# Patient Record
Sex: Female | Born: 1950 | Race: White | Hispanic: No | Marital: Married | Smoking: Never smoker
Health system: Southern US, Community
[De-identification: ages and names within clinical notes are randomized; demographics above are authoritative.]

## PROBLEM LIST (undated history)

## (undated) DIAGNOSIS — G44009 Cluster headache syndrome, unspecified, not intractable: Secondary | ICD-10-CM

## (undated) DIAGNOSIS — J189 Pneumonia, unspecified organism: Secondary | ICD-10-CM

## (undated) DIAGNOSIS — Z9989 Dependence on other enabling machines and devices: Secondary | ICD-10-CM

## (undated) DIAGNOSIS — C50412 Malignant neoplasm of upper-outer quadrant of left female breast: Principal | ICD-10-CM

## (undated) DIAGNOSIS — C50919 Malignant neoplasm of unspecified site of unspecified female breast: Secondary | ICD-10-CM

## (undated) DIAGNOSIS — Z9889 Other specified postprocedural states: Secondary | ICD-10-CM

## (undated) DIAGNOSIS — R011 Cardiac murmur, unspecified: Secondary | ICD-10-CM

## (undated) DIAGNOSIS — Z923 Personal history of irradiation: Secondary | ICD-10-CM

## (undated) DIAGNOSIS — I10 Essential (primary) hypertension: Secondary | ICD-10-CM

## (undated) DIAGNOSIS — K219 Gastro-esophageal reflux disease without esophagitis: Secondary | ICD-10-CM

## (undated) DIAGNOSIS — T7840XA Allergy, unspecified, initial encounter: Secondary | ICD-10-CM

## (undated) DIAGNOSIS — I639 Cerebral infarction, unspecified: Secondary | ICD-10-CM

## (undated) DIAGNOSIS — E7800 Pure hypercholesterolemia, unspecified: Secondary | ICD-10-CM

## (undated) DIAGNOSIS — G4730 Sleep apnea, unspecified: Secondary | ICD-10-CM

## (undated) DIAGNOSIS — L719 Rosacea, unspecified: Secondary | ICD-10-CM

## (undated) DIAGNOSIS — IMO0002 Reserved for concepts with insufficient information to code with codable children: Secondary | ICD-10-CM

## (undated) DIAGNOSIS — M1990 Unspecified osteoarthritis, unspecified site: Secondary | ICD-10-CM

## (undated) DIAGNOSIS — G4733 Obstructive sleep apnea (adult) (pediatric): Secondary | ICD-10-CM

## (undated) DIAGNOSIS — R600 Localized edema: Secondary | ICD-10-CM

## (undated) DIAGNOSIS — R1310 Dysphagia, unspecified: Secondary | ICD-10-CM

## (undated) DIAGNOSIS — R112 Nausea with vomiting, unspecified: Secondary | ICD-10-CM

## (undated) DIAGNOSIS — K52839 Microscopic colitis, unspecified: Secondary | ICD-10-CM

## (undated) DIAGNOSIS — I872 Venous insufficiency (chronic) (peripheral): Secondary | ICD-10-CM

## (undated) DIAGNOSIS — J45909 Unspecified asthma, uncomplicated: Secondary | ICD-10-CM

## (undated) HISTORY — PX: TONSILLECTOMY: SUR1361

## (undated) HISTORY — PX: FRACTURE SURGERY: SHX138

## (undated) HISTORY — PX: TUBAL LIGATION: SHX77

## (undated) HISTORY — PX: CARPAL TUNNEL RELEASE: SHX101

## (undated) HISTORY — DX: Essential (primary) hypertension: I10

## (undated) HISTORY — DX: Unspecified osteoarthritis, unspecified site: M19.90

## (undated) HISTORY — PX: SQUAMOUS CELL CARCINOMA EXCISION: SHX2433

## (undated) HISTORY — DX: Localized edema: R60.0

## (undated) HISTORY — DX: Malignant neoplasm of upper-outer quadrant of left female breast: C50.412

## (undated) HISTORY — PX: FOREARM FRACTURE SURGERY: SHX649

## (undated) HISTORY — PX: APPENDECTOMY: SHX54

## (undated) HISTORY — PX: KNEE ARTHROSCOPY: SHX127

## (undated) HISTORY — PX: COLONOSCOPY: SHX174

## (undated) HISTORY — DX: Cerebral infarction, unspecified: I63.9

## (undated) HISTORY — DX: Allergy, unspecified, initial encounter: T78.40XA

## (undated) HISTORY — PX: BREAST BIOPSY: SHX20

## (undated) HISTORY — DX: Dysphagia, unspecified: R13.10

---

## 1979-04-09 DIAGNOSIS — IMO0002 Reserved for concepts with insufficient information to code with codable children: Secondary | ICD-10-CM

## 1979-04-09 HISTORY — DX: Reserved for concepts with insufficient information to code with codable children: IMO0002

## 1997-08-08 HISTORY — PX: ABDOMINAL HYSTERECTOMY: SHX81

## 2003-06-23 DIAGNOSIS — I452 Bifascicular block: Secondary | ICD-10-CM | POA: Insufficient documentation

## 2009-12-24 DIAGNOSIS — R2230 Localized swelling, mass and lump, unspecified upper limb: Secondary | ICD-10-CM | POA: Insufficient documentation

## 2013-07-19 DIAGNOSIS — G4733 Obstructive sleep apnea (adult) (pediatric): Secondary | ICD-10-CM | POA: Insufficient documentation

## 2013-10-23 DIAGNOSIS — E669 Obesity, unspecified: Secondary | ICD-10-CM | POA: Insufficient documentation

## 2014-08-08 HISTORY — PX: REDUCTION MAMMAPLASTY: SUR839

## 2014-08-08 HISTORY — PX: BREAST LUMPECTOMY: SHX2

## 2014-09-04 ENCOUNTER — Other Ambulatory Visit: Payer: Self-pay

## 2014-09-04 DIAGNOSIS — N63 Unspecified lump in unspecified breast: Secondary | ICD-10-CM

## 2014-09-05 ENCOUNTER — Other Ambulatory Visit: Payer: Self-pay

## 2014-09-05 ENCOUNTER — Other Ambulatory Visit: Payer: Self-pay | Admitting: Obstetrics and Gynecology

## 2014-09-05 DIAGNOSIS — N6489 Other specified disorders of breast: Secondary | ICD-10-CM

## 2014-09-15 ENCOUNTER — Other Ambulatory Visit: Payer: Self-pay | Admitting: Obstetrics and Gynecology

## 2014-09-15 DIAGNOSIS — N6489 Other specified disorders of breast: Secondary | ICD-10-CM

## 2014-09-18 ENCOUNTER — Encounter (INDEPENDENT_AMBULATORY_CARE_PROVIDER_SITE_OTHER): Payer: Self-pay

## 2014-09-18 ENCOUNTER — Ambulatory Visit
Admission: RE | Admit: 2014-09-18 | Discharge: 2014-09-18 | Disposition: A | Discharge status: Home / Self Care | Payer: Private Health Insurance - Indemnity | Source: Ambulatory Visit

## 2014-09-18 ENCOUNTER — Ambulatory Visit
Admission: RE | Admit: 2014-09-18 | Discharge: 2014-09-18 | Disposition: A | Discharge status: Home / Self Care | Payer: Private Health Insurance - Indemnity | Source: Ambulatory Visit | Attending: Obstetrics and Gynecology | Admitting: Obstetrics and Gynecology

## 2014-09-18 ENCOUNTER — Other Ambulatory Visit: Payer: Self-pay | Admitting: Obstetrics and Gynecology

## 2014-09-18 DIAGNOSIS — N6489 Other specified disorders of breast: Secondary | ICD-10-CM

## 2014-09-18 DIAGNOSIS — C50919 Malignant neoplasm of unspecified site of unspecified female breast: Secondary | ICD-10-CM

## 2014-09-18 HISTORY — DX: Malignant neoplasm of unspecified site of unspecified female breast: C50.919

## 2014-09-19 ENCOUNTER — Ambulatory Visit
Admission: RE | Admit: 2014-09-19 | Discharge: 2014-09-19 | Disposition: A | Discharge status: Home / Self Care | Payer: Private Health Insurance - Indemnity | Source: Ambulatory Visit | Attending: Obstetrics and Gynecology | Admitting: Obstetrics and Gynecology

## 2014-09-19 ENCOUNTER — Other Ambulatory Visit: Payer: Self-pay | Admitting: Obstetrics and Gynecology

## 2014-09-19 DIAGNOSIS — N6489 Other specified disorders of breast: Secondary | ICD-10-CM

## 2014-09-19 DIAGNOSIS — C50912 Malignant neoplasm of unspecified site of left female breast: Secondary | ICD-10-CM

## 2014-09-19 DIAGNOSIS — D0512 Intraductal carcinoma in situ of left breast: Secondary | ICD-10-CM

## 2014-09-22 ENCOUNTER — Telehealth: Payer: Self-pay | Admitting: *Deleted

## 2014-09-22 ENCOUNTER — Encounter: Payer: Self-pay | Admitting: *Deleted

## 2014-09-22 DIAGNOSIS — C50912 Malignant neoplasm of unspecified site of left female breast: Secondary | ICD-10-CM

## 2014-09-22 DIAGNOSIS — C50412 Malignant neoplasm of upper-outer quadrant of left female breast: Secondary | ICD-10-CM

## 2014-09-22 DIAGNOSIS — C50911 Malignant neoplasm of unspecified site of right female breast: Secondary | ICD-10-CM | POA: Insufficient documentation

## 2014-09-22 HISTORY — DX: Malignant neoplasm of upper-outer quadrant of left female breast: C50.412

## 2014-09-22 NOTE — Telephone Encounter (Signed)
Confirmed [facility] for 10/01/14 at 1200 .  Instructions and contact information given. Pt recently moved from [place].

## 2014-09-22 NOTE — Telephone Encounter (Signed)
Left vm for pt to return call to scheduled [facility] 10/01/14.

## 2014-09-23 ENCOUNTER — Other Ambulatory Visit: Payer: Self-pay | Admitting: General Surgery

## 2014-09-23 DIAGNOSIS — C50912 Malignant neoplasm of unspecified site of left female breast: Secondary | ICD-10-CM

## 2014-09-23 DIAGNOSIS — D0512 Intraductal carcinoma in situ of left breast: Secondary | ICD-10-CM

## 2014-09-26 ENCOUNTER — Inpatient Hospital Stay: Admission: RE | Admit: 2014-09-26 | Payer: Private Health Insurance - Indemnity | Source: Ambulatory Visit

## 2014-09-26 ENCOUNTER — Ambulatory Visit
Admission: RE | Admit: 2014-09-26 | Discharge: 2014-09-26 | Disposition: A | Discharge status: Home / Self Care | Payer: Private Health Insurance - Indemnity | Source: Ambulatory Visit | Attending: General Surgery | Admitting: General Surgery

## 2014-09-26 DIAGNOSIS — C50912 Malignant neoplasm of unspecified site of left female breast: Secondary | ICD-10-CM

## 2014-09-26 DIAGNOSIS — D0512 Intraductal carcinoma in situ of left breast: Secondary | ICD-10-CM

## 2014-09-26 MED ORDER — GADOBENATE DIMEGLUMINE 529 MG/ML IV SOLN
18.0000 mL | Freq: Once | INTRAVENOUS | Status: AC | PRN
  Administered 2014-09-26: 18 mL via INTRAVENOUS

## 2014-09-30 ENCOUNTER — Other Ambulatory Visit: Payer: Self-pay | Admitting: General Surgery

## 2014-09-30 DIAGNOSIS — R928 Other abnormal and inconclusive findings on diagnostic imaging of breast: Secondary | ICD-10-CM

## 2014-10-01 ENCOUNTER — Ambulatory Visit
Admission: RE | Admit: 2014-10-01 | Discharge: 2014-10-01 | Disposition: A | Discharge status: Home / Self Care | Payer: Private Health Insurance - Indemnity | Source: Ambulatory Visit | Attending: Radiation Oncology | Admitting: Radiation Oncology

## 2014-10-01 ENCOUNTER — Ambulatory Visit: Payer: Managed Care, Other (non HMO) | Attending: General Surgery | Admitting: Physical Therapy

## 2014-10-01 ENCOUNTER — Encounter: Payer: Self-pay | Admitting: Physical Therapy

## 2014-10-01 ENCOUNTER — Other Ambulatory Visit (HOSPITAL_BASED_OUTPATIENT_CLINIC_OR_DEPARTMENT_OTHER): Payer: Private Health Insurance - Indemnity

## 2014-10-01 ENCOUNTER — Encounter: Payer: Self-pay | Admitting: Oncology

## 2014-10-01 ENCOUNTER — Ambulatory Visit: Payer: Private Health Insurance - Indemnity

## 2014-10-01 ENCOUNTER — Telehealth: Payer: Self-pay | Admitting: Oncology

## 2014-10-01 ENCOUNTER — Ambulatory Visit (HOSPITAL_BASED_OUTPATIENT_CLINIC_OR_DEPARTMENT_OTHER): Payer: Private Health Insurance - Indemnity | Admitting: Oncology

## 2014-10-01 ENCOUNTER — Other Ambulatory Visit: Payer: Private Health Insurance - Indemnity

## 2014-10-01 VITALS — BP 137/72 | HR 57 | Temp 98.6°F | Resp 18 | Wt 198.1 lb

## 2014-10-01 DIAGNOSIS — C50412 Malignant neoplasm of upper-outer quadrant of left female breast: Secondary | ICD-10-CM

## 2014-10-01 DIAGNOSIS — E7800 Pure hypercholesterolemia, unspecified: Secondary | ICD-10-CM | POA: Insufficient documentation

## 2014-10-01 DIAGNOSIS — R293 Abnormal posture: Secondary | ICD-10-CM | POA: Diagnosis not present

## 2014-10-01 DIAGNOSIS — K219 Gastro-esophageal reflux disease without esophagitis: Secondary | ICD-10-CM | POA: Insufficient documentation

## 2014-10-01 DIAGNOSIS — C50212 Malignant neoplasm of upper-inner quadrant of left female breast: Secondary | ICD-10-CM | POA: Insufficient documentation

## 2014-10-01 DIAGNOSIS — Z170 Estrogen receptor positive status [ER+]: Secondary | ICD-10-CM

## 2014-10-01 DIAGNOSIS — I10 Essential (primary) hypertension: Secondary | ICD-10-CM | POA: Insufficient documentation

## 2014-10-01 LAB — CBC WITH DIFFERENTIAL/PLATELET
BASO%: 0.4 % (ref 0.0–2.0)
BASOS ABS: 0 10*3/uL (ref 0.0–0.1)
EOS%: 2.5 % (ref 0.0–7.0)
Eosinophils Absolute: 0.1 10*3/uL (ref 0.0–0.5)
HCT: 41.2 % (ref 34.8–46.6)
HGB: 13.6 g/dL (ref 11.6–15.9)
LYMPH#: 1.5 10*3/uL (ref 0.9–3.3)
LYMPH%: 27 % (ref 14.0–49.7)
MCH: 31.9 pg (ref 25.1–34.0)
MCHC: 33.1 g/dL (ref 31.5–36.0)
MCV: 96.3 fL (ref 79.5–101.0)
MONO#: 0.6 10*3/uL (ref 0.1–0.9)
MONO%: 9.7 % (ref 0.0–14.0)
NEUT%: 60.4 % (ref 38.4–76.8)
NEUTROS ABS: 3.4 10*3/uL (ref 1.5–6.5)
Platelets: 196 10*3/uL (ref 145–400)
RBC: 4.28 10*6/uL (ref 3.70–5.45)
RDW: 12.6 % (ref 11.2–14.5)
WBC: 5.7 10*3/uL (ref 3.9–10.3)

## 2014-10-01 LAB — COMPREHENSIVE METABOLIC PANEL (CC13)
ALBUMIN: 4.1 g/dL (ref 3.5–5.0)
ALT: 28 U/L (ref 0–55)
AST: 18 U/L (ref 5–34)
Alkaline Phosphatase: 54 U/L (ref 40–150)
Anion Gap: 10 mEq/L (ref 3–11)
BUN: 16.3 mg/dL (ref 7.0–26.0)
CHLORIDE: 105 meq/L (ref 98–109)
CO2: 28 mEq/L (ref 22–29)
Calcium: 10.5 mg/dL — ABNORMAL HIGH (ref 8.4–10.4)
Creatinine: 0.9 mg/dL (ref 0.6–1.1)
EGFR: 73 mL/min/{1.73_m2} — ABNORMAL LOW (ref >90)
GLUCOSE: 102 mg/dL (ref 70–140)
POTASSIUM: 4.2 meq/L (ref 3.5–5.1)
Sodium: 142 mEq/L (ref 136–145)
TOTAL PROTEIN: 7.2 g/dL (ref 6.4–8.3)
Total Bilirubin: 0.41 mg/dL (ref 0.20–1.20)

## 2014-10-01 NOTE — Telephone Encounter (Signed)
per pf ot sch pt appt-gave pt copy of sch

## 2014-10-01 NOTE — Patient Instructions (Signed)

## 2014-10-01 NOTE — Progress Notes (Signed)
[place]  Telephone:([phone] Fax:(336) [phone]     ID: Laura Bush DOB: October 14, 1950  MR#: [id number]  UKG#:[id number]  Patient Care Team: No Pcp Per Patient as PCP - General (General Practice) Fanny Skates, MD as Consulting Physician (General Surgery) Chauncey Cruel, MD as Consulting Physician (Oncology) Jodelle Gross, MD as Consulting Physician (Radiation Oncology) [facility], RN as Registered Nurse Roselee Culver, RN as Registered Nurse PCP: No PCP Per Patient GYN: SU:  OTHER MD:  CHIEF COMPLAINT: estrogen receptor positive breast cancer  CURRENT TREATMENT: awaiting definitive surgery   BREAST CANCER HISTORY: "Laura Bush"  recently moved to the [place] area from [place]. While still there, she had routine screening mammography with tomography 02/14/2014, which showed only a tiny breast cysts. Six-month follow-up exam was performed 08/25/2014, and now there was evidence of a possible focal asymmetry in the left breast. On 08/25/2014 at the [place] of radiology she underwent a unilateral diagnostic mammography with tomography , and this confirmed a spiculated mass in the left breast superiorly. Sonography of this area found a poorly defined hypoechoic mass measuring 0.7 cm at the 11:00 position 8 cm from the nipple. There was also an adjacent cluster of cysts with a benign circumcised appearance.   Biopsy of the mass in question was recommended, but as the patient was moving to this area, this was performed here on 09/18/2014. 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    on 09/26/2014 the patient underwent bilateral breast MRI at [facility] imaging, showing a breast density category B. The right  breast was unremarkable and there were no lymph nodes of concern. In the upper inner quadrant of the left breast there was an enhancing mass measuring 1.2 cm. In addition there was a 5.5 cm area of linear enhancement extending anteriorly from this mass. 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   The patient's subsequent history is as detailed below  INTERVAL HISTORY:  Laura Bush was evaluated in the multidisciplinary breast cancer clinic 10/01/2014 accompanied by her husband Laura Bush. Her case was presented at the multidisciplinary breast cancer conference that same morning. The consensus was that breast conservation  Surgery might be possible with the help of plastic surgery. However additional biopsy from the area of non-masslike enhancement in question was suggested and is scheduled for 10/09/2014. Assuming the patients final pathology shows no lymph node involvement, as anticipated, she would be a good candidate for an Oncotype.  REVIEW OF SYSTEMS: There were no specific symptoms leading to the original mammogram, which was routinely scheduled. The patient denies unusual headaches, visual changes, nausea, vomiting, stiff neck, dizziness, or gait imbalance. There has been no cough, phlegm production, or pleurisy, no chest pain or pressure, and no change in bowel or bladder habits. The patient denies fever, rash, bleeding, unexplained fatigue or unexplained weight loss.  Laura Bush does not exercise regularly but she and Laura Bush have recently joined a gym [place] detailed review of systems was otherwise entirely negative.  PAST MEDICAL HISTORY: Past Medical History  Diagnosis Date  . Breast cancer of upper-outer quadrant of left female breast 09/22/2014  . Hypertension   . Skin cancer     PAST SURGICAL HISTORY: Past Surgical History  Procedure Laterality Date  . Appendectomy    .  Tonsillectomy    . Cesarean section      x2  . Tubal ligation    . Abdominal hysterectomy    . Meniscus  repair      FAMILY HISTORY Family History  Problem Relation Age of Onset  . Uterine cancer Sister    the patient's father died at the age of 65 following a stroke. The patient's mother is still living, age 42. GEN had no brothers, one sister. That sister was diagnosed with uterine cancer at the age of 47. There is no other history of breast or ovarian cancer in the family to the patient's knowledge  GYNECOLOGIC HISTORY:  No LMP recorded.  menarche age 59, first live birth age 46, the patient is Laura Bush P2.  She underwent total abdominal hysterectomy with bilateral salpingo-oophorectomy in 1999. She took hormone replacement for approximately 4 years. 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 Their daughter Laura Bush lives in [place] where she works as an Web designer. Their son Laura Bush lives in [place] where he works as a Education officer, museum. Laura Bush is expecting twins.    ADVANCED DIRECTIVES:  Not in place. The patient has the appropriate forms to complete and notarize at her discretion.   HEALTH MAINTENANCE: History  Substance Use Topics  . Smoking status: Never Smoker   . Smokeless tobacco: Not on file  . Alcohol Use: 2.4 oz/week    4 Glasses of wine per week     Comment: 4 glasses a week     Colonoscopy:  2008  PAP:  Bone density: 2007/"normal"  Lipid panel:  No Known Allergies  Current Outpatient Prescriptions  Medication Sig Dispense Refill  . aMILoride (MIDAMOR) 5 MG tablet Take 5 mg by mouth daily.    . B Complex-C (SUPER B COMPLEX PO) Take by mouth daily.    . Cholecalciferol (VITAMIN D3) 2000 UNITS TABS Take 1 tablet by mouth daily.    . hydrochlorothiazide (MICROZIDE) 12.5 MG capsule Take 12.5 mg by mouth daily.    . metoprolol succinate (TOPROL-XL) 50 MG 24 hr tablet Take 50 mg by mouth  daily. Take with or immediately following a meal.    . omega-3 fish oil (MAXEPA) 1000 MG CAPS capsule Take 1 capsule by mouth daily. Salmon oil    . simvastatin (ZOCOR) 20 MG tablet Take 20 mg by mouth daily.    . Telmisartan-Amlodipine 80-5 MG TABS Take 1 tablet by mouth daily.  3   No current facility-administered medications for this visit.    OBJECTIVE:  Middle-aged white woman who appears stated age 64 Vitals:   10/01/14 1244  BP: 137/72  Pulse: 57  Temp: 98.6 F (37 C)  Resp: 18     There is no height on file to calculate BMI.    ECOG FS:0 - Asymptomatic  Ocular: Sclerae unicteric, pupils equal, round and reactive to light Ear-nose-throat: Oropharynx clear, dentition fair Lymphatic: No cervical or supraclavicular adenopathy Lungs no rales or rhonchi, good excursion bilaterally Heart regular rate and rhythm, no murmur appreciated Abd soft, obese, nontender, positive bowel sounds MSK no focal spinal tenderness, no joint edema Neuro: non-focal, well-oriented, appropriate affect Breasts:  The right breast is unremarkable. The left breast is status post recent biopsy. I do not palpate a well-defined mass. There are no skin or nipple changes of concern. The  left axilla is benign.   LAB RESULTS:  CMP     Component Value Date/Time   NA 142 10/01/2014 1230   K 4.2 10/01/2014 1230   CO2 28 10/01/2014 1230   GLUCOSE 102 10/01/2014 1230   BUN 16.3 10/01/2014 1230   CREATININE 0.9 10/01/2014 1230   CALCIUM 10.5* 10/01/2014 1230   PROT 7.2 10/01/2014 1230   ALBUMIN 4.1 10/01/2014 1230   AST 18 10/01/2014 1230   ALT 28 10/01/2014 1230   ALKPHOS 54 10/01/2014 1230   BILITOT 0.41 10/01/2014 1230    INo results found for: SPEP, UPEP  Lab Results  Component Value Date   WBC 5.7 10/01/2014   NEUTROABS 3.4 10/01/2014   HGB 13.6 10/01/2014   HCT 41.2 10/01/2014   MCV 96.3 10/01/2014   PLT 196 10/01/2014      Chemistry      Component Value Date/Time   NA 142  10/01/2014 1230   K 4.2 10/01/2014 1230   CO2 28 10/01/2014 1230   BUN 16.3 10/01/2014 1230   CREATININE 0.9 10/01/2014 1230      Component Value Date/Time   CALCIUM 10.5* 10/01/2014 1230   ALKPHOS 54 10/01/2014 1230   AST 18 10/01/2014 1230   ALT 28 10/01/2014 1230   BILITOT 0.41 10/01/2014 1230       No results found for: LABCA2  No components found for: LABCA125  No results for input(s): INR in the last 168 hours.  Urinalysis No results found for: COLORURINE, APPEARANCEUR, LABSPEC, PHURINE, GLUCOSEU, HGBUR, BILIRUBINUR, KETONESUR, PROTEINUR, UROBILINOGEN, NITRITE, LEUKOCYTESUR  STUDIES: Mr Breast Bilateral W Wo Contrast  09/29/2014   CLINICAL DATA:  64 year old female with biopsy proven invasive ductal carcinoma and ductal carcinoma in situ in the 11 o'clock region of the left breast.  LABS:  None obtained at the time of imaging.  EXAM: BILATERAL BREAST MRI WITH AND WITHOUT CONTRAST  TECHNIQUE: Multiplanar, multisequence MR images of both breasts were obtained prior to and following the intravenous administration of 18 ml of Multihance.  THREE-DIMENSIONAL MR IMAGE RENDERING ON INDEPENDENT WORKSTATION:  Three-dimensional MR images were rendered by post-processing of the original MR data on an independent workstation. The three-dimensional MR images were interpreted, and findings are reported in the following complete MRI report for this study. Three dimensional images were evaluated at the independent DynaCad workstation  COMPARISON:  Previous exam(s).  FINDINGS: Breast composition: b.  Scattered fibroglandular tissue.  Background parenchymal enhancement: Moderate.  Right breast: No mass or abnormal enhancement.  Left breast: In the posterior third of the upper-inner quadrant of the left breast is an enhancing mass measuring 1.2 x 0.8 x 1.0 cm. It is associated with a signal void artifact from a biopsy clip. There are post biopsy changes. There is 5.5 cm of linear enhancement extending  anteriorly from the mass. Ductal carcinoma in-situ could not be excluded  Lymph nodes: No abnormal appearing lymph nodes.  Ancillary findings:  None.  IMPRESSION: 1.2 cm mass in the 11 o'clock region of the left breast corresponding with the biopsy proven invasive ductal carcinoma and ductal carcinoma in situ. 5.5 cm of linear enhancement extending anteriorly from the mass concerning for ductal carcinoma in-situ.  RECOMMENDATION: MR guided core biopsy of the anterior aspect of the linear enhancement extending from the known malignancy is recommended. This will be scheduled at the patient's convenience.  BI-RADS CATEGORY  4: Suspicious.   Electronically Signed   By: Lillia Mountain M.D.   On: 09/29/2014 13:34  Mm Digital Diagnostic Unilat L  09/18/2014   CLINICAL DATA:  Suspicious left breast mass.  EXAM: DIAGNOSTIC LEFT MAMMOGRAM POST ULTRASOUND BIOPSY  COMPARISON:  Previous exam(s).  FINDINGS: Mammographic images were obtained following ultrasound guided biopsy of left breast mass, 11 o'clock position. Heart shaped biopsy marking clip in appropriate position  IMPRESSION: Appropriate position heart shaped biopsy marking clip status post ultrasound-guided core needle biopsy left breast mass, 11 o'clock position.  Final Assessment: Post Procedure Mammograms for Marker Placement   Electronically Signed   By: Lovey Newcomer M.D.   On: 09/18/2014 12:27   Mm Radiologist Eval And Mgmt  09/19/2014   CONSULTATION: HPI: The patient returns for a followup after ultrasound-guided biopsy of a 0.7 cm mass in the upper left breast. The patient is doing well and denies any biopsy site complications.  Pathology results: Grade 2 invasive ductal carcinoma and ductal carcinoma in situ. This is concordant with the imaging findings.  Biopsy site: Clean, dry, and intact. Steri-Strips overlie small skin incision.  PLAN: All the patient's questions were answered. She is aware of the results and demonstrates understanding. She has received  breast cancer information material.  The patient is scheduled to follow-up in Multidisciplinary conference 10/01/2014 and a breast MRI is scheduled on 09/26/2014 at 10:15 a.m.   Electronically Signed   By: Everlean Alstrom M.D.   On: 09/19/2014 13:36   [place] Lt Breast Bx W Loc Dev 1st Lesion Img Bx Spec US Guide  09/18/2014   CLINICAL DATA:  Patient with suspicious left breast mass, 11 o'clock position appear  EXAM: ULTRASOUND GUIDED LEFT BREAST CORE NEEDLE BIOPSY  COMPARISON:  Previous exam(s).  PROCEDURE: I met with the patient and we discussed the procedure of ultrasound-guided biopsy, including benefits and alternatives. We discussed the high likelihood of a successful procedure. We discussed the risks of the procedure including infection, bleeding, tissue injury, clip migration, and inadequate sampling. Informed written consent was given. The usual time-out protocol was performed immediately prior to the procedure.  Using sterile technique and 2% Lidocaine as local anesthetic, under direct ultrasound visualization, a 12 gauge vacuum-assisted device was used to perform biopsy of left breast mass, 11 o'clock positionusing a medial approach. At the conclusion of the procedure, a heart shaped tissue marker clip was deployed into the biopsy cavity. Follow-up 2-view mammogram was performed and dictated separately.  IMPRESSION: Ultrasound-guided biopsy of left breast mass, 11 o'clock position. No apparent complications.   Electronically Signed   By: Lovey Newcomer M.D.   On: 09/18/2014 12:26    ASSESSMENT: 64 y.o. [place], [place] woman status post left breast upper outer quadrant biopsy 09/18/2014 for a clinical T1 cN0 invasive ductal carcinoma, grade 2, estrogen and progesterone receptor positive, HER-2 not amplified, with an MIB-1 of 30%  (1) there is a 5.5 cm area of non-masslike enhancement seen in the left breast with biopsy scheduled for 10/07/2014  (2) definitive surgery is pending, possibly requiring plastics  reconstruction  (3) an Oncotype DX will be requested from the final pathology sample  (4)  A final decision regarding chemotherapy will be made depending on Oncotype and final pathology results   (5) the patient may benefit from adjuvant radiation again depending on the final pathology results  ( 6) the patient will benefit from antiestrogen's after the completion of her local treatment.  PLAN: We spent the better part of today's hour-long appointment discussing the biology of breast cancer in general, and the specifics of the patient's tumor in  particular. Laura Bush understands the invasive tumor we have documented measures approximately 1/2 inch, and that we do not see involvement of lymph nodes. This would be a stage I invasive ductal breast cancer. The larger area in the same breast of non-masslike enhancement looks to [place] like ductal carcinoma in situ. We discussed the fact that if this proves to be noninvasive disease, it would not change the stage that it would change the surgical approach. That is why she is having a biopsy 10/07/2014  From a systemic point of view, her breast cancer is estrogen receptor positive and she will clearly benefit from anti-estrogens for a minimum of 5 years. This will reduce the risk of recurrence by half. Just as clearly she will not benefit from anti-HER-2 treatment.  The more difficult question is whether she would benefit from chemotherapy. To help [place] with that question, assuming indeed the cancer proves to be lymph node negative, we will request an Oncotype DX to be sent from the final surgical sample. We discussed what this test represents on how it is interpreted and I gave her written information on it. She will return to see me approximately 2 weeks after her surgery to discuss those results and at that point, if the Oncotype is in the low risk category, she can proceed to radiation. Of course if it comes in in the high-risk category, which seems unlikely, she  would need chemotherapy. If it is in the intermediate range, we would have to have a discussion regarding this specific percentage benefit of chemotherapy so she could decide whether he was worth it for her or not to undergo that form of treatment.   Laura Bush has a good understanding of the overall plan. She agrees with it. She knows the goal of treatment in her case is cure. She will call with any problems that may develop before her next visit here.  Chauncey Cruel, MD   10/01/2014 1:56 PM Medical Oncology and Hematology [facility] [address] [place], [place] [address] Tel. [phone]    Fax. [phone]

## 2014-10-01 NOTE — Therapy (Signed)
[place], [place], [address] Phone: [phone]   Fax:  [phone]  Physical Therapy Evaluation  Patient Details  Name: Laura Bush MRN: [id number] Date of Birth: [birth year] Referring Provider:  Fanny Skates, MD  Encounter Date: 10/01/2014      PT End of Session - 10/01/14 1543    Visit Number 1   Number of Visits 1   PT Start Time 1440   PT Stop Time 1510   PT Time Calculation (min) 30 min   Activity Tolerance Patient tolerated treatment well   Behavior During Therapy [facility] for tasks assessed/performed      Past Medical History  Diagnosis Date  . Breast cancer of upper-outer quadrant of left female breast 09/22/2014  . Hypertension   . Skin cancer     Past Surgical History  Procedure Laterality Date  . Appendectomy    . Tonsillectomy    . Cesarean section      x2  . Tubal ligation    . Abdominal hysterectomy    . Meniscus repair      There were no vitals taken for this visit.  Visit Diagnosis:  Breast cancer of upper-inner quadrant of left female breast - Plan: PT plan of care cert/re-cert  Abnormal posture - Plan: PT plan of care cert/re-cert      Subjective Assessment - 10/01/14 1528    Symptoms Patient was seen today for her new diagnosis of left breast cancer. A baseline assessment was completed.   Pertinent History Diagnosed with left upper inner quadrant ER/PR positive, HER2 negative breast cancer on 09/19/14.     Patient Stated Goals Reduce lymphedema risk and learn post op shoulder ROM HEP.   Currently in Pain? Yes   Pain Score 4    Pain Location Knee   Pain Orientation Left;Right   Pain Descriptors / Indicators Aching   Pain Type Chronic pain   Pain Onset More than a month ago   Pain Frequency Intermittent   Aggravating Factors  sitting still   Pain Relieving Factors moving around   Multiple Pain Sites No          [facility] PT Assessment - 10/01/14 0001    Assessment   Medical Diagnosis left breast cancer   Onset Date 09/19/14   Precautions   Precautions Other (comment)  active left breast cancer   Restrictions   Weight Bearing Restrictions No   Balance Screen   Has the patient fallen in the past 6 months No   Has the patient had a decrease in activity level because of a fear of falling?  No   Is the patient reluctant to leave their home because of a fear of falling?  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   Leisure Exercises at [employer] on an elliptical 10 minutes twice a week   Cognition   Overall Cognitive Status Within Functional Limits for tasks assessed   Posture/Postural Control   Posture/Postural Control Postural limitations   Postural Limitations Rounded Shoulders;Forward head   ROM / Strength   AROM / PROM / Strength AROM;Strength   AROM   AROM Assessment Site Shoulder   Right/Left Shoulder Right;Left   Right Shoulder Extension 50 Degrees   Right Shoulder Flexion 161 Degrees  Right Shoulder ABduction 168 Degrees   Right Shoulder Internal Rotation 70 Degrees   Right Shoulder External Rotation 90 Degrees   Left Shoulder Extension 55 Degrees   Left Shoulder Flexion 160 Degrees   Left Shoulder ABduction 164 Degrees   Left Shoulder Internal Rotation 72 Degrees   Left Shoulder External Rotation 90 Degrees   Strength   Overall Strength Within functional limits for tasks performed           LYMPHEDEMA/ONCOLOGY QUESTIONNAIRE - 10/01/14 1539    Type   Cancer Type Left breast cancer   Lymphedema Assessments   Lymphedema Assessments Upper extremities   Right Upper Extremity Lymphedema   10 cm Proximal to Olecranon Process 35.8 cm   Olecranon Process 27.7 cm   10 cm Proximal to Ulnar Styloid Process 25.6 cm   Just Proximal  to Ulnar Styloid Process 16.3 cm   Across Hand at [employer] 17.8 cm   At [place] of 2nd Digit 5.6 cm   Left Upper Extremity Lymphedema   10 cm Proximal to Olecranon Process 35.5 cm   Olecranon Process 26.5 cm   10 cm Proximal to Ulnar Styloid Process 24.8 cm   Just Proximal to Ulnar Styloid Process 15.8 cm   Across Hand at [employer] 17.6 cm   At [place] of 2nd Digit 5.5 cm       Patient was instructed today in a home exercise program today for post op shoulder range of motion. These included active assist shoulder flexion in sitting, scapular retraction, wall walking with shoulder abduction, and hands behind head external rotation.  She was encouraged to do these twice a day, holding 3 seconds and repeating 5 times when permitted by her physician.          PT Education - 10/01/14 1542    Education provided Yes   Education Details Post op shoulder ROM HEP and lymphdeema risk reduction education   Person(s) Educated Patient;Spouse   Methods Explanation;Handout;Demonstration   Comprehension Verbalized understanding              Breast Clinic Goals - 10/01/14 1554    Patient will be able to verbalize understanding of pertinent lymphedema risk reduction practices relevant to her diagnosis specifically related to skin care.   Time 1   Period Days   Status Achieved   Patient will be able to return demonstrate and/or verbalize understanding of the post-op home exercise program related to regaining shoulder range of motion.   Time 1   Period Days   Status Achieved   Patient will be able to verbalize understanding of the importance of attending the postoperative After Breast Cancer Class for further lymphedema risk reduction education and therapeutic exercise.   Time 1   Period Days   Status Achieved              Plan - 10/01/14 1544    Clinical Impression Statement Patient was seen today for a baseline assessment of her newly diagnosed left breast cancer.  She  is planning to have a second biopsy on 10/07/14 and then surgery will be determined.  She will then likely undergo a left lumpectomy and a sentinel node biopsy and radiaiton.   Pt will benefit from skilled therapeutic intervention in order to improve on the following deficits Decreased range of motion;Increased edema;Decreased knowledge of precautions;Decreased strength;Pain;Impaired UE functional use   Rehab Potential Good   Clinical Impairments Affecting Rehab Potential none   PT Frequency One time  visit   PT Treatment/Interventions Patient/family education;Therapeutic exercise   Consulted and Agree with Plan of Care Patient       Patient will follow up at outpatient cancer rehab if needed following surgery.  If the patient requires physical therapy at that time, a specific plan will be dictated and sent to the referring physician for approval. The patient was educated today on appropriate basic range of motion exercises to begin post operatively and the importance of attending the After Breast Cancer class following surgery.  Patient was educated today on lymphedema risk reduction practices as it pertains to recommendations that will benefit the patient immediately following surgery.  She verbalized good understanding.  No additional physical therapy is indicated at this time.      Problem List Patient Active Problem List   Diagnosis Date Noted  . Breast cancer of upper-outer quadrant of left female breast 09/22/2014    Annia Friendly, PT 10/01/2014, 3:56 PM  [place], [place], [address] Phone: [phone]   Fax:  [phone]

## 2014-10-01 NOTE — Progress Notes (Signed)
Laura Bush is a very pleasant 63 y.o. 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  Biopsy results revealed the tumor's hormone status as ER positive, PR positive, and HER2/neu negative. Ki67 is 30%.  She presents today with her husband to the [facility]) for treatment consideration and recommendations from the breast surgeon, radiation oncologist, and medical oncologist.     I briefly met with Laura Bush and her husband during her [facility] visit today. We discussed the purpose of the Survivorship Clinic, which will include monitoring for recurrence, coordinating completion of age and gender-appropriate cancer screenings, promotion of overall wellness, as well as managing potential late/long-term side effects of anti-cancer treatments.    As of today, much of the treatment plan for Laura Bush is pending as she undergoes additional MRI-guided biopsy. The intent of treatment for Laura Bush at this time is cure, therefore she will be eligible for the Survivorship Clinic upon her completion of treatment.  Her survivorship care plan (SCP) document will be drafted and updated throughout the course of her treatment trajectory. She will receive the SCP in an office visit with myself in the Survivorship Clinic once she has completed treatment.   Laura Bush was encouraged to ask questions and all questions were answered to her satisfaction.  She was given my business card and encouraged to contact me with any concerns regarding survivorship.  I look forward to participating in her care.   Mike Craze, NP [place] [phone]

## 2014-10-01 NOTE — Progress Notes (Signed)
Checked in new pt with no financial concerns prior to seeing the dr. Informed pt if chemo is part of her treatment Raquel will call her ins to see if Josem Kaufmann is req and will obtain it if it is as well as contact foundations that offer copay assistance for chemo if needed. She has Raquel's card for any billing questions or concerns.

## 2014-10-02 ENCOUNTER — Other Ambulatory Visit: Payer: Self-pay | Admitting: Oncology

## 2014-10-02 ENCOUNTER — Telehealth: Payer: Self-pay | Admitting: Oncology

## 2014-10-02 NOTE — Telephone Encounter (Signed)
cld pt to adv GM had added lab for 3/28 @ 1-left message of time & date of appt

## 2014-10-03 ENCOUNTER — Encounter: Payer: Self-pay | Admitting: Radiation Oncology

## 2014-10-03 ENCOUNTER — Telehealth: Payer: Self-pay | Admitting: Oncology

## 2014-10-03 NOTE — Telephone Encounter (Signed)
pt cld left vm to confirm appt time & location-cld & spoke to pt adv time & location-pt understood

## 2014-10-03 NOTE — Progress Notes (Signed)
Radiation Oncology         [phone] ________________________________  Name: Laura Bush MRN: [id number]  Date: 10/01/2014  DOB: [birth year]  CC:No PCP Per Patient  Fanny Skates, MD    Lurline Del, MD  REFERRING PHYSICIAN: Fanny Skates, MD   DIAGNOSIS: The encounter diagnosis was Breast cancer of upper-outer quadrant of left female breast.  Breast cancer of upper-outer quadrant of left female breast   Staging form: Breast, AJCC 7th Edition     Clinical stage from 10/01/2014: Stage IA (T1c, N0, M0) - Unsigned    HISTORY OF PRESENT ILLNESS::Laura Bush is a 64 y.o. female who is seen for an initial consultation visit regarding the patient's diagnosis of breast cancer.  The patient was found to have suspicious findings within the left breast on follow-up mammogram. The patient has not had symptoms prior to this study.  On ultrasound, the tumor measured 0.7 cm and was present in the upper inner quadrant.  A biopsy was performed after the patient moved to [place] from [place] where the initial workup was performed. This revealed an invasive ductal carcinoma. Receptors studies were completed and indicate that the tumor is estrogen receptor positive, progesterone receptor positive, and Her-2/neu negative. The Ki-67 staining was 30 %.   The patient has undergone an MRI scan of the breasts: This revealed a 1.2 cm lesion consistent with the biopsy findings. Additionally there was a 5.5 cm area of linear enhancement extending anteriorly from this mass. 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 A biopsy has tentatively been scheduled for this more anteriorly located abnormality.  The patient was discussed this morning in multidisciplinary breast conference and she is seen in clinic today to formulate an overall plan regarding this new finding.    PREVIOUS RADIATION THERAPY: No   PAST MEDICAL HISTORY:  has a past medical history of Breast cancer of  upper-outer quadrant of left female breast (09/22/2014); Hypertension; and Skin cancer.     PAST SURGICAL HISTORY: Past Surgical History  Procedure Laterality Date  . Appendectomy    . Tonsillectomy    . Cesarean section      x2  . Tubal ligation    . Abdominal hysterectomy    . Meniscus repair       FAMILY HISTORY: family history includes Uterine cancer in her sister.   SOCIAL HISTORY:  reports that she has never smoked. She does not have any smokeless tobacco history on file. She reports that she drinks about 2.4 oz of alcohol per week. She reports that she does not use illicit drugs.   ALLERGIES: Review of patient's allergies indicates no known allergies.   MEDICATIONS:  Current Outpatient Prescriptions  Medication Sig Dispense Refill  . aMILoride (MIDAMOR) 5 MG tablet Take 5 mg by mouth daily.    . B Complex-C (SUPER B COMPLEX PO) Take by mouth daily.    . Cholecalciferol (VITAMIN D3) 2000 UNITS TABS Take 1 tablet by mouth daily.    . hydrochlorothiazide (MICROZIDE) 12.5 MG capsule Take 12.5 mg by mouth daily.    . metoprolol succinate (TOPROL-XL) 50 MG 24 hr tablet Take 50 mg by mouth daily. Take with or immediately following a meal.    . omega-3 fish oil (MAXEPA) 1000 MG CAPS capsule Take 1 capsule by mouth daily. Salmon oil    . simvastatin (ZOCOR) 20 MG tablet Take 20 mg by mouth daily.    . Telmisartan-Amlodipine 80-5 MG TABS Take 1 tablet by mouth  daily.  3   No current facility-administered medications for this encounter.     REVIEW OF SYSTEMS:  A 15 point review of systems is documented in the electronic medical record. This was obtained by the nursing staff. However, I reviewed this with the patient to discuss relevant findings and make appropriate changes.  Pertinent items are noted in HPI.    PHYSICAL EXAM:  vitals were not taken for this visit.  ECOG = 0  0 - Asymptomatic (Fully active, able to carry on all predisease activities without restriction)  1  - Symptomatic but completely ambulatory (Restricted in physically strenuous activity but ambulatory and able to carry out work of a light or sedentary nature. For example, light housework, office work)  2 - Symptomatic, <50% in bed during the day (Ambulatory and capable of all self care but unable to carry out any work activities. Up and about more than 50% of waking hours)  3 - Symptomatic, >50% in bed, but not bedbound (Capable of only limited self-care, confined to bed or chair 50% or more of waking hours)  4 - Bedbound (Completely disabled. Cannot carry on any self-care. Totally confined to bed or chair)  5 - Death   Eustace Pen MM, Creech RH, Tormey DC, et al. [phone]). "Toxicity and response criteria of the [facility] Group". [place] Oncol. 5 (6): 649-55  General: Well-developed, in no acute distress HEENT: Normocephalic, atraumatic; oral cavity clear Neck: Supple without any lymphadenopathy Cardiovascular: Regular rate and rhythm Respiratory: Clear to auscultation bilaterally Breasts: Patient is status post biopsy of the left breast. No clear palpable nodule on my exam. No axillary adenopathy. Unremarkable breast exam on the right with no axillary adenopathy. GI: Soft, nontender, normal bowel sounds Extremities: No edema present Neuro: No focal deficits     LABORATORY DATA:  Lab Results  Component Value Date   WBC 5.7 10/01/2014   HGB 13.6 10/01/2014   HCT 41.2 10/01/2014   MCV 96.3 10/01/2014   PLT 196 10/01/2014   Lab Results  Component Value Date   NA 142 10/01/2014   K 4.2 10/01/2014   CO2 28 10/01/2014   Lab Results  Component Value Date   ALT 28 10/01/2014   AST 18 10/01/2014   ALKPHOS 54 10/01/2014   BILITOT 0.41 10/01/2014      RADIOGRAPHY: Mr Breast Bilateral W Wo Contrast  09/29/2014   CLINICAL DATA:  64 year old female with biopsy proven invasive ductal carcinoma and ductal carcinoma in situ in the 11 o'clock region of the left breast.   LABS:  None obtained at the time of imaging.  EXAM: BILATERAL BREAST MRI WITH AND WITHOUT CONTRAST  TECHNIQUE: Multiplanar, multisequence MR images of both breasts were obtained prior to and following the intravenous administration of 18 ml of Multihance.  THREE-DIMENSIONAL MR IMAGE RENDERING ON INDEPENDENT WORKSTATION:  Three-dimensional MR images were rendered by post-processing of the original MR data on an independent workstation. The three-dimensional MR images were interpreted, and findings are reported in the following complete MRI report for this study. Three dimensional images were evaluated at the independent DynaCad workstation  COMPARISON:  Previous exam(s).  FINDINGS: Breast composition: b.  Scattered fibroglandular tissue.  Background parenchymal enhancement: Moderate.  Right breast: No mass or abnormal enhancement.  Left breast: In the posterior third of the upper-inner quadrant of the left breast is an enhancing mass measuring 1.2 x 0.8 x 1.0 cm. It is associated with a signal void artifact from a biopsy clip.  There are post biopsy changes. There is 5.5 cm of linear enhancement extending anteriorly from the mass. Ductal carcinoma in-situ could not be excluded  Lymph nodes: No abnormal appearing lymph nodes.  Ancillary findings:  None.  IMPRESSION: 1.2 cm mass in the 11 o'clock region of the left breast corresponding with the biopsy proven invasive ductal carcinoma and ductal carcinoma in situ. 5.5 cm of linear enhancement extending anteriorly from the mass concerning for ductal carcinoma in-situ.  RECOMMENDATION: MR guided core biopsy of the anterior aspect of the linear enhancement extending from the known malignancy is recommended. This will be scheduled at the patient's convenience.  BI-RADS CATEGORY  4: Suspicious.   Electronically Signed   By: Lillia Mountain M.D.   On: 09/29/2014 13:34   Mm Digital Diagnostic Unilat L  09/18/2014   CLINICAL DATA:  Suspicious left breast mass.  EXAM: DIAGNOSTIC  LEFT MAMMOGRAM POST ULTRASOUND BIOPSY  COMPARISON:  Previous exam(s).  FINDINGS: Mammographic images were obtained following ultrasound guided biopsy of left breast mass, 11 o'clock position. Heart shaped biopsy marking clip in appropriate position  IMPRESSION: Appropriate position heart shaped biopsy marking clip status post ultrasound-guided core needle biopsy left breast mass, 11 o'clock position.  Final Assessment: Post Procedure Mammograms for Marker Placement   Electronically Signed   By: Lovey Newcomer M.D.   On: 09/18/2014 12:27   Mm Radiologist Eval And Mgmt  09/19/2014   CONSULTATION: HPI: The patient returns for a followup after ultrasound-guided biopsy of a 0.7 cm mass in the upper left breast. The patient is doing well and denies any biopsy site complications.  Pathology results: Grade 2 invasive ductal carcinoma and ductal carcinoma in situ. This is concordant with the imaging findings.  Biopsy site: Clean, dry, and intact. Steri-Strips overlie small skin incision.  PLAN: All the patient's questions were answered. She is aware of the results and demonstrates understanding. She has received breast cancer information material.  The patient is scheduled to follow-up in Multidisciplinary conference 10/01/2014 and a breast MRI is scheduled on 09/26/2014 at 10:15 a.m.   Electronically Signed   By: Everlean Alstrom M.D.   On: 09/19/2014 13:36   [place] Lt Breast Bx W Loc Dev 1st Lesion Img Bx Spec US Guide  09/18/2014   CLINICAL DATA:  Patient with suspicious left breast mass, 11 o'clock position appear  EXAM: ULTRASOUND GUIDED LEFT BREAST CORE NEEDLE BIOPSY  COMPARISON:  Previous exam(s).  PROCEDURE: I met with the patient and we discussed the procedure of ultrasound-guided biopsy, including benefits and alternatives. We discussed the high likelihood of a successful procedure. We discussed the risks of the procedure including infection, bleeding, tissue injury, clip migration, and inadequate sampling. Informed  written consent was given. The usual time-out protocol was performed immediately prior to the procedure.  Using sterile technique and 2% Lidocaine as local anesthetic, under direct ultrasound visualization, a 12 gauge vacuum-assisted device was used to perform biopsy of left breast mass, 11 o'clock positionusing a medial approach. At the conclusion of the procedure, a heart shaped tissue marker clip was deployed into the biopsy cavity. Follow-up 2-view mammogram was performed and dictated separately.  IMPRESSION: Ultrasound-guided biopsy of left breast mass, 11 o'clock position. No apparent complications.   Electronically Signed   By: Lovey Newcomer M.D.   On: 09/18/2014 12:26       IMPRESSION:    Breast cancer of upper-outer quadrant of left female breast   09/19/2014 Initial Biopsy Breast, left, needle core biopsy, mass, 11  o'clock - INVASIVE DUCTAL CARCINOMA. - DUCTAL CARCINOMA IN SITU.   09/19/2014 Receptors her2 Estrogen Receptor: 100%, POSITIVE, STRONG STAINING INTENSITY Progesterone Receptor: 43%, POSITIVE, STRONG STAINING INTENSITY Proliferation Marker Ki67: 30%,FISH analysis of Her 2 was performed. The results are as follows: Result-Negative   09/22/2014 Initial Diagnosis Breast cancer of upper-outer quadrant of left female breast   09/29/2014 Breast MRI 1.2 cm mass in the 11 o'clock region of the left breast corresponding with the biopsy proven invasive ductal carcinoma and ductal carcinoma in situ. 5.5 cm of linear enhancement extending anteriorly from the mass concerning for ductal carcinoma in-situ    The patient has a recent diagnosis of invasive ductal carcinoma of the left breast. She appears to be a good candidate for breast conservation treatment. She is interested in a lumpectomy if possible. Additional biopsy of possible anterior extent of DCIS is pending. Surgery feels that a lumpectomy may be feasible for her case. The patient has also seen medical oncology and an Oncotype is planned to  help clarify the role of systemic treatment.  I discussed with the patient the role of adjuvant radiation treatment in this setting. We discussed the potential benefit of radiation treatment, especially with regards to local control of the patient's tumor. We also discussed the possible side effects and risks of such a treatment as well.  All of the patient's questions were answered. The patient wishes to proceed with radiation treatment at the appropriate time.  PLAN: I look forward to seeing the patient postoperatively to review her case and further discuss and coordinate an anticipated course of radiation treatment.       ________________________________   Jodelle Gross, MD, PhD   **Disclaimer: This note was dictated with voice recognition software. Similar sounding words can inadvertently be transcribed and this note may contain transcription errors which may not have been corrected upon publication of note.**

## 2014-10-06 ENCOUNTER — Encounter: Payer: Self-pay | Admitting: General Practice

## 2014-10-06 ENCOUNTER — Telehealth: Payer: Self-pay | Admitting: *Deleted

## 2014-10-06 NOTE — Telephone Encounter (Signed)
Spoke with patient from [facility] 10/01/14.  She is doing well and awaiting a second biopsy tomorrow.  Denies any questions or concerns at this time.  Encouraged her to call with any needs or concerns.

## 2014-10-06 NOTE — Progress Notes (Signed)
[place] Psychosocial Distress Screening Spiritual Care Visited with Narelle and husband Louie Casa in [facility] to introduce [place] and [place] team/resources, and to review distress screen per protocol.  The patient scored a 3 on the Psychosocial Distress Thermometer which indicates mild distress. Also assessed for distress and other psychosocial needs.   ONCBCN DISTRESS SCREENING 10/06/2014  Screening Type Initial Screening  Distress experienced in past week (1-10) 3  Emotional problem type Nervousness/Anxiety;Adjusting to illness  Information Concerns Type Lack of info about treatment  Referral to support programs Yes  Other Spiritual Care, counseling interns   Per pt, she is reeling from a dx "out of the blue."  Couple is semi-retired and just moved from [place] area.  Rather than simply a source of stress, couple cites this transition as a gift because of the quality of care pt will receive at [facility].  Per pt, she has a close friend who just went through breast ca tx at [facility], which is also a help.  Provided pastoral presence, reflective listening, and encouragement to use Spiritual Care, counseling interns, and [place] resources for support.  Follow up needed: No.   [place], [place]

## 2014-10-07 ENCOUNTER — Ambulatory Visit
Admission: RE | Admit: 2014-10-07 | Discharge: 2014-10-07 | Disposition: A | Discharge status: Home / Self Care | Payer: 59 | Source: Ambulatory Visit | Attending: General Surgery | Admitting: General Surgery

## 2014-10-07 DIAGNOSIS — R928 Other abnormal and inconclusive findings on diagnostic imaging of breast: Secondary | ICD-10-CM

## 2014-10-07 HISTORY — PX: BREAST BIOPSY: SHX20

## 2014-10-07 MED ORDER — GADOBENATE DIMEGLUMINE 529 MG/ML IV SOLN: 18.0000 mL | Freq: Once | INTRAVENOUS | Status: AC | PRN

## 2014-10-09 ENCOUNTER — Other Ambulatory Visit: Payer: Private Health Insurance - Indemnity

## 2014-10-10 ENCOUNTER — Telehealth: Payer: Self-pay | Admitting: Oncology

## 2014-10-10 NOTE — Telephone Encounter (Signed)
received a call from pt this morning stating she received incorrect information in the mail. confirmed with patient aptps for 3/28 and 3/30. schedule mailed.

## 2014-10-13 ENCOUNTER — Other Ambulatory Visit (INDEPENDENT_AMBULATORY_CARE_PROVIDER_SITE_OTHER): Payer: Self-pay | Admitting: General Surgery

## 2014-10-13 DIAGNOSIS — C50912 Malignant neoplasm of unspecified site of left female breast: Secondary | ICD-10-CM

## 2014-10-14 ENCOUNTER — Other Ambulatory Visit (INDEPENDENT_AMBULATORY_CARE_PROVIDER_SITE_OTHER): Payer: Self-pay | Admitting: General Surgery

## 2014-10-14 DIAGNOSIS — C50912 Malignant neoplasm of unspecified site of left female breast: Secondary | ICD-10-CM

## 2014-10-16 ENCOUNTER — Ambulatory Visit (INDEPENDENT_AMBULATORY_CARE_PROVIDER_SITE_OTHER): Payer: 59 | Admitting: Primary Care

## 2014-10-16 ENCOUNTER — Encounter: Payer: Self-pay | Admitting: Primary Care

## 2014-10-16 VITALS — BP 124/74 | HR 65 | Temp 97.9°F | Ht 59.0 in | Wt 197.8 lb

## 2014-10-16 DIAGNOSIS — E7800 Pure hypercholesterolemia, unspecified: Secondary | ICD-10-CM

## 2014-10-16 DIAGNOSIS — I10 Essential (primary) hypertension: Secondary | ICD-10-CM | POA: Diagnosis not present

## 2014-10-16 DIAGNOSIS — C50412 Malignant neoplasm of upper-outer quadrant of left female breast: Secondary | ICD-10-CM

## 2014-10-16 DIAGNOSIS — I872 Venous insufficiency (chronic) (peripheral): Secondary | ICD-10-CM

## 2014-10-16 DIAGNOSIS — E785 Hyperlipidemia, unspecified: Secondary | ICD-10-CM

## 2014-10-16 DIAGNOSIS — K219 Gastro-esophageal reflux disease without esophagitis: Secondary | ICD-10-CM

## 2014-10-16 NOTE — Assessment & Plan Note (Signed)
Will obtain old records. Stable on Simvastatin. She has an appointment to come back fasting for a lipid panel.

## 2014-10-16 NOTE — Progress Notes (Signed)
Pre visit review using our clinic review tool, if applicable. No additional management support is needed unless otherwise documented below in the visit note. 

## 2014-10-16 NOTE — Assessment & Plan Note (Signed)
Stable off of pantoprazole. Continue to monitor.

## 2014-10-16 NOTE — Assessment & Plan Note (Signed)
See HPI. Surgery scheduled within the next 4 weeks.

## 2014-10-16 NOTE — Assessment & Plan Note (Signed)
Stable today. Recent CMET obtained by breast center on 10/01/14, unremarkable. Continue to monitor.

## 2014-10-16 NOTE — Patient Instructions (Signed)
Please schedule a lab appointment to return fasting for your cholesterol level. Take the prescription to a medical supply store. Schedule a physical at your convenience within the next three months. Welcome to [facility]!

## 2014-10-16 NOTE — Progress Notes (Signed)
Subjective:    Patient ID: Laura Bush, female    DOB: [birth year], 64 y.o.   MRN: [id number]  HPI  [name] is a plesant 64 year old female who presents today to establish care.  1) Hypertension:   Patient is currently maintained on the following medications for blood pressure: amloride, HCTZ, Toprol-XL, telmisartan-amlodipine, and reports good compliance with blood pressure medications. She denies chest pain, shortness of breath, but has edema to bilateral ankles that have been present for many years due to venous insuffiency. She uses compression hose that help to decrease the swelling . Her blood pressure has been managed at a HTN clinic in [place] for the past two years due to difficulty in managing her readings, and since then has been well controlled over the past 2 years. BP today 124/74.   2) Hyperlipidemia:  Patient is currently maintained on the following medication for hyperlipidemia: Simvastatin 20mg  for several years. Last lipid panel was one year ago and medication was adjusted one year ago from 10mg  to 20mg . Patient denies myalgia. Patient reports good compliance with low fat/low cholesterol diet. She avoids fast and fried foods. She and her husband joined the [facility] and are starting to exercise by walking 30 min daily at least three times a week, with goal of 5-6 times/week.  3) GERD: Symptoms currently are not bothersome except when she consumes spicy foods, has late night meal or is undergoin stress. She was maintained on pantoprazole, but has not required it in 4 months.  4) Breast Cancer: Left breast. She had a mammogram January 18th which noted a peculiar spot so she underwent an ultrasound which required two needle biopsy's which were completed at the breast center. She received her diagnosis on February 12th 2016.  The current plan is to have a lumpectomy/partial mastesctomy on the left side with reduction on the right.  Radiation and hormone therapy will  begin post operatively. Surgey is pending for the next 4 weeks.    Review of Systems  Constitutional: Negative for fever and unexpected weight change.  HENT: Negative for congestion and rhinorrhea.        Seasonal allergies, Claritin helps.  Respiratory: Negative for cough and shortness of breath.   Cardiovascular: Negative for chest pain.  Gastrointestinal: Negative for diarrhea and constipation.  Genitourinary: Negative for dysuria and urgency.  Musculoskeletal: Positive for arthralgias.       Bilateral knee pain. Ibuprofen and tylenol helps.  Skin: Negative for rash.  Neurological: Negative for dizziness and headaches.  Hematological: Negative for adenopathy.  Psychiatric/Behavioral:       Denies depression/anxiety       Past Medical History  Diagnosis Date  . Breast cancer of upper-outer quadrant of left female breast 09/22/2014  . Hypertension   . Skin cancer     History   Social History  . Marital Status: Married    Spouse Name: N/A  . Number of Children: N/A  . Years of Education: N/A   Occupational History  . Not on file.   Social History Main Topics  . Smoking status: Never Smoker   . Smokeless tobacco: Not on file  . Alcohol Use: 2.4 oz/week    4 Glasses of wine per week     Comment: 4 glasses a week  . Drug Use: No  . Sexual Activity: Not on file   Other Topics Concern  . Not on file   Social History Narrative   From [place].   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   2 children, boy and girl   Twin grandchildren on the way.   Quilting, sewing, reading.    Past Surgical History  Procedure Laterality Date  . Appendectomy    . Tonsillectomy    . Cesarean section      x2  . Tubal ligation    . Abdominal hysterectomy    . Meniscus repair      Family History  Problem Relation Age of Onset  . Uterine cancer Sister 1  . Stroke Father     deceased  . Dementia Mother     Lives in [place]    No  Known Allergies  Current Outpatient Prescriptions on File Prior to Visit  Medication Sig Dispense Refill  . aMILoride (MIDAMOR) 5 MG tablet Take 5 mg by mouth daily.    . B Complex-C (SUPER B COMPLEX PO) Take by mouth daily.    . Cholecalciferol (VITAMIN D3) 2000 UNITS TABS Take 1 tablet by mouth daily.    . hydrochlorothiazide (MICROZIDE) 12.5 MG capsule Take 12.5 mg by mouth daily.    . metoprolol succinate (TOPROL-XL) 50 MG 24 hr tablet Take 50 mg by mouth daily. Take with or immediately following a meal.    . omega-3 fish oil (MAXEPA) 1000 MG CAPS capsule Take 1 capsule by mouth daily. Salmon oil    . simvastatin (ZOCOR) 20 MG tablet Take 20 mg by mouth daily.    . Telmisartan-Amlodipine 80-5 MG TABS Take 1 tablet by mouth daily.  3   No current facility-administered medications on file prior to visit.    BP 124/74 mmHg  Pulse 65  Temp(Src) 97.9 F (36.6 C) (Oral)  Ht 4\' 11"  (1.499 m)  Wt 197 lb 12.8 oz (89.721 kg)  BMI 39.93 kg/m2  SpO2 97%    Objective:   Physical Exam  Constitutional: She is oriented to person, place, and time. She appears well-developed.  HENT:  Head: Normocephalic.  Right Ear: External ear normal.  Left Ear: External ear normal.  Mouth/Throat: Oropharynx is clear and moist.  Eyes: EOM are normal. Pupils are equal, round, and reactive to light.  Neck: Neck supple.  Cardiovascular: Normal rate and regular rhythm.   Pulmonary/Chest: Effort normal and breath sounds normal. She has no wheezes. She has no rales.  Abdominal: Soft. Bowel sounds are normal. She exhibits no mass. There is no tenderness.  Lymphadenopathy:    She has no cervical adenopathy.  Neurological: She is alert and oriented to person, place, and time. She displays normal reflexes. No cranial nerve deficit.  Skin: Skin is warm and dry.  Psychiatric: She has a normal mood and affect.          Assessment & Plan:

## 2014-10-16 NOTE — Assessment & Plan Note (Signed)
Stable, no leg or calf pain. Wears compression stockings which help to decrease edema. New RX for more compression stockings provided.

## 2014-10-21 ENCOUNTER — Telehealth: Payer: Self-pay | Admitting: Primary Care

## 2014-10-21 ENCOUNTER — Other Ambulatory Visit (INDEPENDENT_AMBULATORY_CARE_PROVIDER_SITE_OTHER): Payer: 59

## 2014-10-21 DIAGNOSIS — E785 Hyperlipidemia, unspecified: Secondary | ICD-10-CM

## 2014-10-21 LAB — LIPID PANEL
Cholesterol: 173 mg/dL (ref 0–200)
HDL: 53.1 mg/dL (ref >39.00)
LDL CALC: 98 mg/dL (ref 0–99)
NonHDL: 119.9
Total CHOL/HDL Ratio: 3
Triglycerides: 111 mg/dL (ref 0.0–149.0)
VLDL: 22.2 mg/dL (ref 0.0–40.0)

## 2014-10-22 ENCOUNTER — Encounter: Payer: Self-pay | Admitting: *Deleted

## 2014-10-24 ENCOUNTER — Telehealth: Payer: Self-pay

## 2014-10-24 NOTE — Telephone Encounter (Signed)
Laura Bush with [facility] in [place] request ICD 10 code for compression hose; advised from 10/16/14 office note venous insufficiency ICD 10 - 187.2. Info given.

## 2014-10-29 ENCOUNTER — Other Ambulatory Visit (HOSPITAL_COMMUNITY): Payer: Self-pay | Admitting: Plastic Surgery

## 2014-11-03 ENCOUNTER — Other Ambulatory Visit: Payer: Private Health Insurance - Indemnity

## 2014-11-05 ENCOUNTER — Other Ambulatory Visit: Payer: Self-pay

## 2014-11-05 ENCOUNTER — Encounter (HOSPITAL_COMMUNITY): Payer: Self-pay

## 2014-11-05 ENCOUNTER — Other Ambulatory Visit (HOSPITAL_COMMUNITY): Payer: Self-pay | Admitting: *Deleted

## 2014-11-05 ENCOUNTER — Encounter (HOSPITAL_COMMUNITY)
Admission: RE | Admit: 2014-11-05 | Discharge: 2014-11-05 | Disposition: A | Discharge status: Home / Self Care | Payer: Managed Care, Other (non HMO) | Source: Ambulatory Visit | Attending: General Surgery | Admitting: General Surgery

## 2014-11-05 ENCOUNTER — Ambulatory Visit: Payer: Private Health Insurance - Indemnity | Admitting: Oncology

## 2014-11-05 DIAGNOSIS — Z01812 Encounter for preprocedural laboratory examination: Secondary | ICD-10-CM | POA: Insufficient documentation

## 2014-11-05 DIAGNOSIS — Z01810 Encounter for preprocedural cardiovascular examination: Secondary | ICD-10-CM | POA: Diagnosis not present

## 2014-11-05 HISTORY — DX: Gastro-esophageal reflux disease without esophagitis: K21.9

## 2014-11-05 HISTORY — DX: Sleep apnea, unspecified: G47.30

## 2014-11-05 HISTORY — DX: Microscopic colitis, unspecified: K52.839

## 2014-11-05 HISTORY — DX: Pneumonia, unspecified organism: J18.9

## 2014-11-05 HISTORY — DX: Other specified postprocedural states: Z98.890

## 2014-11-05 HISTORY — DX: Cardiac murmur, unspecified: R01.1

## 2014-11-05 HISTORY — DX: Rosacea, unspecified: L71.9

## 2014-11-05 HISTORY — DX: Other specified postprocedural states: R11.2

## 2014-11-05 HISTORY — DX: Venous insufficiency (chronic) (peripheral): I87.2

## 2014-11-05 HISTORY — DX: Unspecified asthma, uncomplicated: J45.909

## 2014-11-05 LAB — CBC WITH DIFFERENTIAL/PLATELET
Basophils Absolute: 0 10*3/uL (ref 0.0–0.1)
Basophils Relative: 0 % (ref 0–1)
EOS ABS: 0.2 10*3/uL (ref 0.0–0.7)
EOS PCT: 4 % (ref 0–5)
HCT: 41.6 % (ref 36.0–46.0)
HEMOGLOBIN: 13.8 g/dL (ref 12.0–15.0)
LYMPHS PCT: 33 % (ref 12–46)
Lymphs Abs: 1.5 10*3/uL (ref 0.7–4.0)
MCH: 32.1 pg (ref 26.0–34.0)
MCHC: 33.2 g/dL (ref 30.0–36.0)
MCV: 96.7 fL (ref 78.0–100.0)
MONOS PCT: 14 % — AB (ref 3–12)
Monocytes Absolute: 0.6 10*3/uL (ref 0.1–1.0)
NEUTROS ABS: 2.2 10*3/uL (ref 1.7–7.7)
Neutrophils Relative %: 49 % (ref 43–77)
Platelets: 200 10*3/uL (ref 150–400)
RBC: 4.3 MIL/uL (ref 3.87–5.11)
RDW: 12.2 % (ref 11.5–15.5)
WBC: 4.5 10*3/uL (ref 4.0–10.5)

## 2014-11-05 LAB — COMPREHENSIVE METABOLIC PANEL
ALBUMIN: 4.1 g/dL (ref 3.5–5.2)
ALT: 31 U/L (ref 0–35)
ANION GAP: 9 (ref 5–15)
AST: 23 U/L (ref 0–37)
Alkaline Phosphatase: 52 U/L (ref 39–117)
BILIRUBIN TOTAL: 0.4 mg/dL (ref 0.3–1.2)
BUN: 14 mg/dL (ref 6–23)
CO2: 28 mmol/L (ref 19–32)
Calcium: 9.7 mg/dL (ref 8.4–10.5)
Chloride: 102 mmol/L (ref 96–112)
Creatinine, Ser: 0.89 mg/dL (ref 0.50–1.10)
GFR calc Af Amer: 78 mL/min — ABNORMAL LOW (ref >90)
GFR calc non Af Amer: 68 mL/min — ABNORMAL LOW (ref >90)
Glucose, Bld: 98 mg/dL (ref 70–99)
Potassium: 3.7 mmol/L (ref 3.5–5.1)
Sodium: 139 mmol/L (ref 135–145)
Total Protein: 6.9 g/dL (ref 6.0–8.3)

## 2014-11-05 LAB — URINALYSIS, ROUTINE W REFLEX MICROSCOPIC
Bilirubin Urine: NEGATIVE
Glucose, UA: NEGATIVE mg/dL
HGB URINE DIPSTICK: NEGATIVE
Ketones, ur: NEGATIVE mg/dL
LEUKOCYTES UA: NEGATIVE
Nitrite: NEGATIVE
Protein, ur: NEGATIVE mg/dL
Specific Gravity, Urine: 1.008 (ref 1.005–1.030)
Urobilinogen, UA: 0.2 mg/dL (ref 0.0–1.0)
pH: 7 (ref 5.0–8.0)

## 2014-11-05 NOTE — Pre-Procedure Instructions (Signed)
Laura Bush  11/05/2014   Your procedure is scheduled on:  Tuesday, November 11, 2014 at 11:00 AM.   Report to [facility] Entrance "A" Admitting Office at 9:00 AM.   Call this number if you have problems the morning of surgery: [phone]               Any questions prior to day of surgery, please call [phone] between 8 & 4 PM.    Remember:   Do not eat food or drink liquids after midnight Monday, 11/10/14.   Take these medicines the morning of surgery with A SIP OF WATER: Metoprolol Succinate (Toprol-XL), Pantoprazole (Protonix)  - if needed  Stop Fish Oil as of today.   Do not wear jewelry, make-up or nail polish.  Do not wear lotions, powders, or perfumes. You may NOT wear deodorant.  Do not shave 48 hours prior to surgery.   Do not bring valuables to the hospital.  [facility] is not responsible                  for any belongings or valuables.               Contacts, dentures or bridgework may not be worn into surgery.  Leave suitcase in the car. After surgery it may be brought to your room.  For patients admitted to the hospital, discharge time is determined by your                treatment team.               Patients discharged the day of surgery will not be allowed to drive home.    Special Instructions: [place] - Preparing for Surgery  Before surgery, you can play an important role.  Because skin is not sterile, your skin needs to be as free of germs as possible.  You can reduce the number of germs on you skin by washing with CHG (chlorahexidine gluconate) soap before surgery.  CHG is an antiseptic cleaner which kills germs and bonds with the skin to continue killing germs even after washing.  Please DO NOT use if you have an allergy to CHG or antibacterial soaps.  If your skin becomes reddened/irritated stop using the CHG and inform your nurse when you arrive at Short Stay.  Do not shave (including legs and underarms) for at least 48 hours prior to the  first CHG shower.  You may shave your face.  Please follow these instructions carefully:   1.  Shower with CHG Soap the night before surgery and the                                morning of Surgery.  2.  If you choose to wash your hair, wash your hair first as usual with your       normal shampoo.  3.  After you shampoo, rinse your hair and body thoroughly to remove the                      Shampoo.  4.  Use CHG as you would any other liquid soap.  You can apply chg directly       to the skin and wash gently with scrungie or a clean washcloth.  5.  Apply the CHG Soap to your body ONLY FROM THE NECK DOWN.  Do not use on open wounds or open sores.  Avoid contact with your eyes, ears, mouth and genitals (private parts).  Wash genitals (private parts) with your normal soap.  6.  Wash thoroughly, paying special attention to the area where your surgery        will be performed.  7.  Thoroughly rinse your body with warm water from the neck down.  8.  DO NOT shower/wash with your normal soap after using and rinsing off       the CHG Soap.  9.  Pat yourself dry with a clean towel.            10.  Wear clean pajamas.            11.  Place clean sheets on your bed the night of your first shower and do not        sleep with pets.  Day of Surgery  Do not apply any lotions/deodorants the morning of surgery.  Please wear clean clothes to the hospital.     Please read over the following fact sheets that you were given: Pain Booklet, Coughing and Deep Breathing and Surgical Site Infection Prevention

## 2014-11-06 ENCOUNTER — Encounter (HOSPITAL_COMMUNITY): Payer: Self-pay

## 2014-11-06 NOTE — Progress Notes (Addendum)
Anesthesia Chart Review:  Patient is a 64 year old female scheduled for left partial mastectomy with double wire localization, left axillary SN biopsy on 11/11/14 by Dr. Dalbert Batman.  History includes left breast cancer, non-smoker, post-operative N/V, childhood murmur, HTN, asthma, venous insufficiency, microscopic colitis, GERD, OSA with CPAP use, skin cancer, hysterectomy. BMI is consistent with obesity. She recently moved to [place] from [place] earlier this year. HEM-ONC is Dr. Jana Hakim.  11/05/14 EKG: SB at 56 bpm, LAD, possible anterior infarct (age undetermined). 08/16/11 EKG interpretation in Care Everywhere indicates prior EKG showed SB, LAFB--tracing requested but is pending. 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  05/18/12 Echo ([facility]; Care Everywhere): The left atrium is mildly dilated. The right atrium is normal in size. The RV is normal in size and systolic function. The left ventricle is normal in size. No left ventricular hypertrophy. There is slightly reduced Tissue Doppler relaxation velocity consistent with diminished diastolic compliance. Normal left ventricular ejection fraction estimated at 65-70%. There are no obvious regional wall motion abnormalities. The aortic valve is abnormal and trileaflet. The aortic valve is mildly thickened. The aortic valve is mildly calcified. No aortic stenosis or regurgitation is seen. The mitral  valve is normal. There is trace mitral regurgitation. The tricuspid valve is normal. There is minimal tricuspid regurgitation (normal variant). The PA Systolic pressure is 31 mmHg (RA Pressure is 8mmHg). There is trace posterior pericardial effusion. No echocardiographic findings consistent with increased intrapericardial pressure. The abdominal aorta is atherosclerotic. Normal interatrial septum. The inferior vena cava is normal.   Preoperative labs noted.   Chart will be left for follow-up regarding  comparison EKG tracing from [place].  George Hugh [facility] Short Stay Center/Anesthesiology Phone [phone] 11/06/2014 4:50 PM  Addendum: Despite refaxing a request with signed medical release last Thursday and calling medical records at [facility] last Friday for the 2013 EKG tracing, I have still not received any additional records.  Surgery is scheduled for tomorrow, so I reviewed what information I had with anesthesiologist Dr. Linna Caprice.  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  2013 echo showed normal LVEF and wall motion.  Plan to repeat EKG on arrival to reassess anterior r wave progression.  Definitive anesthesia plan following anesthesiologist evaluation tomorrow.  George Hugh [facility] Short Stay Center/Anesthesiology Phone [phone] 11/10/2014 3:01 PM

## 2014-11-10 MED ORDER — HEPARIN SODIUM (PORCINE) 5000 UNIT/ML IJ SOLN
5000.0000 [IU] | Freq: Once | INTRAMUSCULAR | Status: AC
  Administered 2014-11-11: 5000 [IU] via SUBCUTANEOUS
  Filled 2014-11-10: qty 1

## 2014-11-10 MED ORDER — CEFAZOLIN SODIUM-DEXTROSE 2-3 GM-% IV SOLR
2.0000 g | INTRAVENOUS | Status: AC
  Administered 2014-11-11: 2 g via INTRAVENOUS
  Filled 2014-11-10: qty 50

## 2014-11-10 NOTE — H&P (Signed)
Laura Bush  Location: [facility] Surgery Patient #: [id number] DOB: 05-27-1951 Married / Language: English / Race: White Female      History of Present Illness  Patient words: Left Breast-Seen at breast clinic 2/24.  The patient is a 64 year old female who presents with breast cancer. She returns with her husband for further decision making regarding management of her left breast cancer. Initially she was evaluated in the [facility] by Dr. Jana Hakim, Dr. Lisbeth Renshaw, and me. No prior history. 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 MRI shows this mass but also showed a 5.5 cm area of linear enhancement extending anteriorly from the cancer, suspicious for DCIS. This clearly did not go all the way to the nipple, however. Her breasts are large and we wanted to consider lumpectomy. She went back and had a second biopsy from the anterior area and this shows atypical ductal hyperplasia. We spent a long time talking about options of mastectomy with or without reconstruction, sentinel node biopsy, double wire bracketed lumpectomy, symmetry procedures and the like. I do not think there is any survival advantage to mastectomy compared to lumpectomy with whole breast irradiation. She would like to be conservative if possible. She knows that she may lose some volume on the left and that would be fine. She might need a reduction on the right and that would be fine with her as well since she is fairly large. We decided to refer her to plastic surgery prior to a double wire lumpectomy. I will discuss the case with Dr. Crissie Reese. I think that perhaps we should do this surgery as a team to get the best long-term cosmetic result. If Dr. Harlow Mares agrees, we will schedule her for a left partial mastectomy with double wire bracketed localization, left  axillary sentinel node biopsy. I discussed the indications, details, techniques, and numerous risk of the surgery with the patient and her husband. She is aware the risk of bleeding, infection, skin necrosis, volume loss, cosmetic deformity, reoperation for positive margins, reoperation for positive nodes, arm swelling, arm numbness, shoulder disability. She understands all of these issues. All of her questions are answered. She and her husband agree with this plan. I will standby which her plastic surgical consultation is complete. Comorbidities include BMI of 39.99, hypertension, hyperlipidemia, GERD.  Addendum Note Dr. Harlow Mares and I reviewed the patient's imaging studies with Dr. Autumn Patty in the Breast Ctr., [facility]. Dr. Autumn Patty is going to see that both wires are placed from a craniocaudal approach and they will come directly down in the center of the upper breast which will be ideal for Dr. Harlow Mares and for me. We will plan to take her to the operating room, inject the blue dye, prepping both sides extensively. I will then first go ahead with the sentinel node biopsy through a separate incision in the right axilla and close that up. After that is done we will make the breast reduction incision and do the central lumpectomy through that incision and a specimen mammogram. After that is done I will scrub out and Dr. Harlow Mares will complete the bilateral breast reduction procedure.     Allergies  No Known Drug Allergies03/10/2014  Medication History  Telmisartan-Amlodipine (80-5MG Tablet, Oral daily) Active. Midamor (5MG Tablet, Oral daily) Active. Vitamin B Complex (Oral daily) Active. Vitamin D3 Super Strength (2000UNIT Tablet, Oral  daily) Active. Hydrochlorothiazide (12.5MG Capsule, Oral daily) Active. Metoprolol Succinate ER (50MG Tablet ER 24HR, Oral daily) Active. Omega 3 (1000MG Capsule, Oral d\) Active. Simvastatin (20MG Tablet, Oral daily) Active.  Vitals    Weight: 198 lb Height: 59in Body Surface Area: 1.93 m Body Mass Index: 39.99 kg/m Temp.: 98.51F(Oral)  Pulse: 64 (Regular)  BP: 128/76 (Sitting, Right Arm, Standard)    Physical Exam General Note: Pleasant. No distress. Cooperative. Husband with her throughout the encounter.   Head and Neck Note: No adenopathy or mass. Good range of motion   Chest and Lung Exam Note: Lungs clear to auscultation bilaterally. No chest wall tenderness   Breast Note: Breasts are large. Soft. No palpable mass. 2 tiny biopsy sites on the left. Some ecchymoses but no hematoma. No axillary adenopathy.   Cardiovascular Note: Regular rate and rhythm. No murmur. No ectopy. Good radial pulses.   Abdomen Note: Healed Pfannenstiel incision. Slightly overweight.     Assessment & Plan  PRIMARY CANCER OF UPPER INNER QUADRANT OF LEFT FEMALE BREAST (174.2  C50.212) Current Plans:    Schedule for Surgery  You have a biopsy-proven cancer in your left breast in the upper half, 11:00 position, posteriorly. The second biopsy which was much more anterior shows atypical hyperplasia. This may be a precancerous area or there may be an in- situ cancer in the area. Nevertheless the entire area needs to be excised. We have discussed the options of mastectomy and sentinel node biopsy versus lumpectomy with sentinel node biopsy and postop breast irradiation therapy. Because your breasts are relatively large, we think that we can go ahead with a generous lumpectomy with double wire localization. I have decided to involve a plastic surgeon to try to get the best long-term cosmetic result. We are going to make an appointment for you to see Dr. Crissie Reese and I will discuss your case with him. If he agrees, you will be scheduled for left breast partial mastectomy with double wire localization and left axillary sentinel node biopsy. We will do the surgery together to get the best result. We have  discussed the indications, techniques, and numerous risk of this surgery with you. All of her questions are answered. We will schedule surgery once we hear from Dr. Harlow Mares.  HISTORY OF HYSTERECTOMY FOR BENIGN DISEASE (V88.01  Z90.710) HYPERTENSION, BENIGN (401.1  I10) BMI 39.0-39.9,ADULT (V85.39  Z68.39)    Edsel Petrin. Dalbert Batman, M.D., [facility] General and Minimally invasive Surgery Breast and Colorectal Surgery Office:   [phone] Pager:   [phone]

## 2014-11-11 ENCOUNTER — Encounter (HOSPITAL_COMMUNITY): Payer: Self-pay | Admitting: Critical Care Medicine

## 2014-11-11 ENCOUNTER — Ambulatory Visit
Admission: RE | Admit: 2014-11-11 | Discharge: 2014-11-11 | Disposition: A | Discharge status: Home / Self Care | Payer: 59 | Source: Ambulatory Visit | Attending: General Surgery | Admitting: General Surgery

## 2014-11-11 ENCOUNTER — Ambulatory Visit (HOSPITAL_COMMUNITY): Payer: Managed Care, Other (non HMO) | Admitting: Vascular Surgery

## 2014-11-11 ENCOUNTER — Ambulatory Visit (HOSPITAL_COMMUNITY)
Admission: RE | Admit: 2014-11-11 | Discharge: 2014-11-11 | Disposition: A | Discharge status: Home / Self Care | Payer: Managed Care, Other (non HMO) | Source: Ambulatory Visit | Attending: General Surgery | Admitting: General Surgery

## 2014-11-11 ENCOUNTER — Other Ambulatory Visit: Payer: Self-pay

## 2014-11-11 ENCOUNTER — Encounter (HOSPITAL_COMMUNITY)
Admission: RE | Disposition: A | Discharge status: Home / Self Care | Payer: 59 | Source: Ambulatory Visit | Attending: General Surgery

## 2014-11-11 ENCOUNTER — Ambulatory Visit (HOSPITAL_COMMUNITY): Payer: Managed Care, Other (non HMO) | Admitting: Certified Registered"

## 2014-11-11 ENCOUNTER — Ambulatory Visit (HOSPITAL_COMMUNITY)
Admission: RE | Admit: 2014-11-11 | Discharge: 2014-11-12 | Disposition: A | Discharge status: Home / Self Care | Payer: Managed Care, Other (non HMO) | Source: Ambulatory Visit | Attending: General Surgery | Admitting: General Surgery

## 2014-11-11 DIAGNOSIS — E785 Hyperlipidemia, unspecified: Secondary | ICD-10-CM | POA: Diagnosis not present

## 2014-11-11 DIAGNOSIS — I10 Essential (primary) hypertension: Secondary | ICD-10-CM | POA: Insufficient documentation

## 2014-11-11 DIAGNOSIS — C50912 Malignant neoplasm of unspecified site of left female breast: Secondary | ICD-10-CM

## 2014-11-11 DIAGNOSIS — J45909 Unspecified asthma, uncomplicated: Secondary | ICD-10-CM | POA: Diagnosis not present

## 2014-11-11 DIAGNOSIS — I739 Peripheral vascular disease, unspecified: Secondary | ICD-10-CM | POA: Insufficient documentation

## 2014-11-11 DIAGNOSIS — Z6839 Body mass index (BMI) 39.0-39.9, adult: Secondary | ICD-10-CM | POA: Insufficient documentation

## 2014-11-11 DIAGNOSIS — K219 Gastro-esophageal reflux disease without esophagitis: Secondary | ICD-10-CM | POA: Diagnosis not present

## 2014-11-11 DIAGNOSIS — C50212 Malignant neoplasm of upper-inner quadrant of left female breast: Secondary | ICD-10-CM | POA: Insufficient documentation

## 2014-11-11 DIAGNOSIS — C50919 Malignant neoplasm of unspecified site of unspecified female breast: Secondary | ICD-10-CM | POA: Diagnosis present

## 2014-11-11 HISTORY — PX: BREAST REDUCTION SURGERY: SHX8

## 2014-11-11 HISTORY — PX: PARTIAL MASTECTOMY WITH NEEDLE LOCALIZATION AND AXILLARY SENTINEL LYMPH NODE BX: SHX6009

## 2014-11-11 HISTORY — DX: Reserved for concepts with insufficient information to code with codable children: IMO0002

## 2014-11-11 HISTORY — DX: Pure hypercholesterolemia, unspecified: E78.00

## 2014-11-11 HISTORY — DX: Dependence on other enabling machines and devices: Z99.89

## 2014-11-11 HISTORY — DX: Cluster headache syndrome, unspecified, not intractable: G44.009

## 2014-11-11 HISTORY — PX: PARTIAL MASTECTOMY WITH AXILLARY SENTINEL LYMPH NODE BIOPSY: SHX6004

## 2014-11-11 HISTORY — DX: Obstructive sleep apnea (adult) (pediatric): G47.33

## 2014-11-11 HISTORY — PX: REDUCTION MAMMAPLASTY: SUR839

## 2014-11-11 SURGERY — PARTIAL MASTECTOMY WITH NEEDLE LOCALIZATION AND AXILLARY SENTINEL LYMPH NODE BX
Anesthesia: General | Site: Breast | Laterality: Left

## 2014-11-11 MED ORDER — LACTATED RINGERS IV SOLN
INTRAVENOUS | Status: DC | PRN
  Administered 2014-11-11 (×2): via INTRAVENOUS

## 2014-11-11 MED ORDER — TELMISARTAN-AMLODIPINE 80-5 MG PO TABS: 1.0000 | ORAL_TABLET | Freq: Every day | ORAL | Status: DC

## 2014-11-11 MED ORDER — DEXAMETHASONE SODIUM PHOSPHATE 4 MG/ML IJ SOLN
INTRAMUSCULAR | Status: DC | PRN
  Administered 2014-11-11: 4 mg via INTRAVENOUS

## 2014-11-11 MED ORDER — METHOCARBAMOL 500 MG PO TABS
500.0000 mg | ORAL_TABLET | Freq: Four times a day (QID) | ORAL | Status: DC
  Administered 2014-11-11 – 2014-11-12 (×3): 500 mg via ORAL
  Filled 2014-11-11 (×3): qty 1

## 2014-11-11 MED ORDER — PROPOFOL 10 MG/ML IV BOLUS
INTRAVENOUS | Status: AC
  Filled 2014-11-11: qty 20

## 2014-11-11 MED ORDER — CEFAZOLIN SODIUM-DEXTROSE 2-3 GM-% IV SOLR
2.0000 g | Freq: Once | INTRAVENOUS | Status: AC
  Administered 2014-11-11: 2 g via INTRAVENOUS

## 2014-11-11 MED ORDER — FENTANYL CITRATE 0.05 MG/ML IJ SOLN
INTRAMUSCULAR | Status: AC
  Filled 2014-11-11: qty 5

## 2014-11-11 MED ORDER — AMILORIDE HCL 5 MG PO TABS
5.0000 mg | ORAL_TABLET | Freq: Every day | ORAL | Status: DC
  Administered 2014-11-12: 5 mg via ORAL
  Filled 2014-11-11: qty 1

## 2014-11-11 MED ORDER — DEXTROSE-NACL 5-0.45 % IV SOLN
INTRAVENOUS | Status: DC
  Administered 2014-11-11 – 2014-11-12 (×2): via INTRAVENOUS

## 2014-11-11 MED ORDER — MIDAZOLAM HCL 2 MG/2ML IJ SOLN
INTRAMUSCULAR | Status: AC
  Administered 2014-11-11: 1 mg
  Filled 2014-11-11: qty 2

## 2014-11-11 MED ORDER — OXYCODONE HCL 5 MG PO TABS: 5.0000 mg | ORAL_TABLET | Freq: Once | ORAL | Status: DC | PRN

## 2014-11-11 MED ORDER — 0.9 % SODIUM CHLORIDE (POUR BTL) OPTIME
TOPICAL | Status: DC | PRN
  Administered 2014-11-11: 1000 mL

## 2014-11-11 MED ORDER — SIMVASTATIN 20 MG PO TABS
20.0000 mg | ORAL_TABLET | Freq: Every day | ORAL | Status: DC
  Administered 2014-11-11: 20 mg via ORAL
  Filled 2014-11-11 (×2): qty 1

## 2014-11-11 MED ORDER — ATROPINE SULFATE 0.1 MG/ML IJ SOLN
INTRAMUSCULAR | Status: AC
  Filled 2014-11-11: qty 10

## 2014-11-11 MED ORDER — EPHEDRINE SULFATE 50 MG/ML IJ SOLN
INTRAMUSCULAR | Status: DC | PRN
  Administered 2014-11-11 (×6): 5 mg via INTRAVENOUS

## 2014-11-11 MED ORDER — FENTANYL CITRATE 0.05 MG/ML IJ SOLN
INTRAMUSCULAR | Status: AC
  Administered 2014-11-11: 50 ug
  Filled 2014-11-11: qty 2

## 2014-11-11 MED ORDER — FENTANYL CITRATE 0.05 MG/ML IJ SOLN
INTRAMUSCULAR | Status: DC | PRN
  Administered 2014-11-11: 50 ug via INTRAVENOUS
  Administered 2014-11-11: 100 ug via INTRAVENOUS
  Administered 2014-11-11 (×7): 50 ug via INTRAVENOUS

## 2014-11-11 MED ORDER — ONDANSETRON HCL 4 MG/2ML IJ SOLN
INTRAMUSCULAR | Status: AC
  Filled 2014-11-11: qty 2

## 2014-11-11 MED ORDER — DOCUSATE SODIUM 100 MG PO CAPS
100.0000 mg | ORAL_CAPSULE | Freq: Every day | ORAL | Status: DC
  Administered 2014-11-11 – 2014-11-12 (×2): 100 mg via ORAL
  Filled 2014-11-11 (×2): qty 1

## 2014-11-11 MED ORDER — ONDANSETRON HCL 4 MG/2ML IJ SOLN
INTRAMUSCULAR | Status: DC | PRN
  Administered 2014-11-11 (×2): 4 mg via INTRAVENOUS

## 2014-11-11 MED ORDER — LACTATED RINGERS IV SOLN: INTRAVENOUS | Status: DC

## 2014-11-11 MED ORDER — METHYLENE BLUE 1 % INJ SOLN
INTRAMUSCULAR | Status: AC
  Filled 2014-11-11: qty 10

## 2014-11-11 MED ORDER — BUPIVACAINE-EPINEPHRINE 0.5% -1:200000 IJ SOLN
INTRAMUSCULAR | Status: DC | PRN
  Administered 2014-11-11: 6 mL

## 2014-11-11 MED ORDER — CEFAZOLIN SODIUM-DEXTROSE 2-3 GM-% IV SOLR
INTRAVENOUS | Status: AC
  Filled 2014-11-11: qty 50

## 2014-11-11 MED ORDER — IRBESARTAN 300 MG PO TABS
300.0000 mg | ORAL_TABLET | Freq: Every day | ORAL | Status: DC
  Filled 2014-11-11: qty 1

## 2014-11-11 MED ORDER — SUCCINYLCHOLINE CHLORIDE 20 MG/ML IJ SOLN
INTRAMUSCULAR | Status: AC
  Filled 2014-11-11: qty 1

## 2014-11-11 MED ORDER — PROMETHAZINE HCL 25 MG/ML IJ SOLN
6.2500 mg | INTRAMUSCULAR | Status: DC | PRN
  Administered 2014-11-12: 6.25 mg via INTRAVENOUS
  Filled 2014-11-11: qty 1

## 2014-11-11 MED ORDER — MIDAZOLAM HCL 5 MG/5ML IJ SOLN
INTRAMUSCULAR | Status: DC | PRN
  Administered 2014-11-11: 2 mg via INTRAVENOUS

## 2014-11-11 MED ORDER — LIDOCAINE HCL (CARDIAC) 20 MG/ML IV SOLN
INTRAVENOUS | Status: DC | PRN
  Administered 2014-11-11: 40 mg via INTRAVENOUS

## 2014-11-11 MED ORDER — HYDROMORPHONE HCL 2 MG PO TABS
2.0000 mg | ORAL_TABLET | ORAL | Status: DC | PRN
  Administered 2014-11-11: 2 mg via ORAL
  Administered 2014-11-11: 4 mg via ORAL
  Administered 2014-11-11: 2 mg via ORAL
  Administered 2014-11-12: 4 mg via ORAL
  Filled 2014-11-11: qty 1
  Filled 2014-11-11 (×2): qty 2
  Filled 2014-11-11: qty 1

## 2014-11-11 MED ORDER — PANTOPRAZOLE SODIUM 20 MG PO TBEC
20.0000 mg | DELAYED_RELEASE_TABLET | Freq: Every day | ORAL | Status: DC | PRN
  Filled 2014-11-11: qty 1

## 2014-11-11 MED ORDER — ARTIFICIAL TEARS OP OINT
TOPICAL_OINTMENT | OPHTHALMIC | Status: DC | PRN
  Administered 2014-11-11: 1 via OPHTHALMIC

## 2014-11-11 MED ORDER — SODIUM CHLORIDE 0.9 % IJ SOLN
INTRAMUSCULAR | Status: AC
  Filled 2014-11-11: qty 10

## 2014-11-11 MED ORDER — LIDOCAINE HCL (CARDIAC) 20 MG/ML IV SOLN
INTRAVENOUS | Status: AC
  Filled 2014-11-11: qty 5

## 2014-11-11 MED ORDER — CEFAZOLIN SODIUM 1-5 GM-% IV SOLN
1.0000 g | Freq: Four times a day (QID) | INTRAVENOUS | Status: DC
  Administered 2014-11-11 – 2014-11-12 (×3): 1 g via INTRAVENOUS
  Filled 2014-11-11 (×5): qty 50

## 2014-11-11 MED ORDER — DEXAMETHASONE SODIUM PHOSPHATE 4 MG/ML IJ SOLN
INTRAMUSCULAR | Status: AC
  Filled 2014-11-11: qty 1

## 2014-11-11 MED ORDER — METOPROLOL SUCCINATE ER 50 MG PO TB24
50.0000 mg | ORAL_TABLET | Freq: Every day | ORAL | Status: DC
  Filled 2014-11-11: qty 1

## 2014-11-11 MED ORDER — DIPHENHYDRAMINE HCL 50 MG/ML IJ SOLN
INTRAMUSCULAR | Status: DC | PRN
  Administered 2014-11-11: 12.5 mg via INTRAVENOUS

## 2014-11-11 MED ORDER — HYDROMORPHONE HCL 1 MG/ML IJ SOLN
INTRAMUSCULAR | Status: AC
  Filled 2014-11-11: qty 1

## 2014-11-11 MED ORDER — ROCURONIUM BROMIDE 50 MG/5ML IV SOLN
INTRAVENOUS | Status: AC
  Filled 2014-11-11: qty 1

## 2014-11-11 MED ORDER — SODIUM CHLORIDE 0.9 % IJ SOLN
INTRAMUSCULAR | Status: DC | PRN
  Administered 2014-11-11: 5 mL via INTRAMUSCULAR

## 2014-11-11 MED ORDER — ONDANSETRON HCL 4 MG/2ML IJ SOLN: 4.0000 mg | Freq: Four times a day (QID) | INTRAMUSCULAR | Status: DC | PRN

## 2014-11-11 MED ORDER — HYDROMORPHONE HCL 1 MG/ML IJ SOLN
0.2500 mg | INTRAMUSCULAR | Status: DC | PRN
  Administered 2014-11-11 (×2): 0.5 mg via INTRAVENOUS

## 2014-11-11 MED ORDER — SCOPOLAMINE 1 MG/3DAYS TD PT72
MEDICATED_PATCH | TRANSDERMAL | Status: AC
  Administered 2014-11-11: 1 via TRANSDERMAL
  Filled 2014-11-11: qty 1

## 2014-11-11 MED ORDER — MIDAZOLAM HCL 2 MG/2ML IJ SOLN
INTRAMUSCULAR | Status: AC
  Filled 2014-11-11: qty 2

## 2014-11-11 MED ORDER — HYDROCHLOROTHIAZIDE 12.5 MG PO CAPS
12.5000 mg | ORAL_CAPSULE | Freq: Every day | ORAL | Status: DC
  Administered 2014-11-11 – 2014-11-12 (×2): 12.5 mg via ORAL
  Filled 2014-11-11 (×2): qty 1

## 2014-11-11 MED ORDER — OXYCODONE HCL 5 MG/5ML PO SOLN: 5.0000 mg | Freq: Once | ORAL | Status: DC | PRN

## 2014-11-11 MED ORDER — DIPHENHYDRAMINE HCL 50 MG/ML IJ SOLN
INTRAMUSCULAR | Status: AC
  Filled 2014-11-11: qty 1

## 2014-11-11 MED ORDER — BUPIVACAINE-EPINEPHRINE (PF) 0.5% -1:200000 IJ SOLN
INTRAMUSCULAR | Status: AC
  Filled 2014-11-11: qty 30

## 2014-11-11 MED ORDER — AMLODIPINE BESYLATE 5 MG PO TABS
5.0000 mg | ORAL_TABLET | Freq: Every day | ORAL | Status: DC
  Administered 2014-11-12: 5 mg via ORAL
  Filled 2014-11-11: qty 1

## 2014-11-11 MED ORDER — ACETAMINOPHEN 500 MG PO TABS: 500.0000 mg | ORAL_TABLET | Freq: Four times a day (QID) | ORAL | Status: DC | PRN

## 2014-11-11 MED ORDER — PROPOFOL 10 MG/ML IV BOLUS
INTRAVENOUS | Status: DC | PRN
  Administered 2014-11-11: 40 mg via INTRAVENOUS
  Administered 2014-11-11: 160 mg via INTRAVENOUS

## 2014-11-11 MED ORDER — ENOXAPARIN SODIUM 40 MG/0.4ML ~~LOC~~ SOLN
40.0000 mg | SUBCUTANEOUS | Status: DC
  Administered 2014-11-12: 40 mg via SUBCUTANEOUS
  Filled 2014-11-11: qty 0.4

## 2014-11-11 MED ORDER — TECHNETIUM TC 99M SULFUR COLLOID FILTERED: 1.0000 | Freq: Once | INTRAVENOUS | Status: AC | PRN

## 2014-11-11 SURGICAL SUPPLY — 49 items
APPLIER CLIP 9.375 MED OPEN (MISCELLANEOUS) ×4
ATCH SMKEVC FLXB CAUT HNDSWH (FILTER) ×6 IMPLANT
BINDER BREAST LRG (GAUZE/BANDAGES/DRESSINGS) IMPLANT
BINDER BREAST XLRG (GAUZE/BANDAGES/DRESSINGS) IMPLANT
BLADE SURG ROTATE 9660 (MISCELLANEOUS) IMPLANT
BNDG ELASTIC 6X15 VLCR STRL LF (GAUZE/BANDAGES/DRESSINGS) ×4 IMPLANT
CANISTER SUCTION 2500CC (MISCELLANEOUS) ×4 IMPLANT
CHLORAPREP W/TINT 26ML (MISCELLANEOUS) ×8 IMPLANT
CLIP APPLIE 9.375 MED OPEN (MISCELLANEOUS) ×3 IMPLANT
CONT SPEC 4OZ CLIKSEAL STRL BL (MISCELLANEOUS) ×8 IMPLANT
COVER PROBE W GEL 5X96 (DRAPES) ×4 IMPLANT
COVER SURGICAL LIGHT HANDLE (MISCELLANEOUS) ×8 IMPLANT
DEVICE DUBIN SPECIMEN MAMMOGRA (MISCELLANEOUS) ×4 IMPLANT
DRAPE CHEST BREAST 15X10 FENES (DRAPES) ×4 IMPLANT
DRAPE PROXIMA HALF (DRAPES) ×4 IMPLANT
DRAPE UTILITY XL STRL (DRAPES) ×8 IMPLANT
DRSG PAD ABDOMINAL 8X10 ST (GAUZE/BANDAGES/DRESSINGS) ×8 IMPLANT
ELECT BLADE 4.0 EZ CLEAN MEGAD (MISCELLANEOUS) ×4
ELECT CAUTERY BLADE 6.4 (BLADE) ×8 IMPLANT
ELECT REM PT RETURN 9FT ADLT (ELECTROSURGICAL) ×8
ELECTRODE BLDE 4.0 EZ CLN MEGD (MISCELLANEOUS) ×3 IMPLANT
ELECTRODE REM PT RTRN 9FT ADLT (ELECTROSURGICAL) ×6 IMPLANT
EVACUATOR SMOKE ACCUVAC VALLEY (FILTER) ×2
GLOVE EUDERMIC 7 POWDERFREE (GLOVE) ×8 IMPLANT
GOWN STRL REUS W/ TWL LRG LVL3 (GOWN DISPOSABLE) ×6 IMPLANT
GOWN STRL REUS W/ TWL XL LVL3 (GOWN DISPOSABLE) ×12 IMPLANT
GOWN STRL REUS W/TWL LRG LVL3 (GOWN DISPOSABLE) ×2
GOWN STRL REUS W/TWL XL LVL3 (GOWN DISPOSABLE) ×4
KIT BASIN OR (CUSTOM PROCEDURE TRAY) ×8 IMPLANT
KIT MARKER MARGIN INK (KITS) ×4 IMPLANT
KIT ROOM TURNOVER OR (KITS) ×4 IMPLANT
LIQUID BAND (GAUZE/BANDAGES/DRESSINGS) ×8 IMPLANT
MARKER SKIN DUAL TIP RULER LAB (MISCELLANEOUS) ×12 IMPLANT
NEEDLE 18GX1X1/2 (RX/OR ONLY) (NEEDLE) ×4 IMPLANT
NEEDLE HYPO 25GX1X1/2 BEV (NEEDLE) ×12 IMPLANT
NS IRRIG 1000ML POUR BTL (IV SOLUTION) ×8 IMPLANT
PACK GENERAL/GYN (CUSTOM PROCEDURE TRAY) ×8 IMPLANT
PAD ABD 8X10 STRL (GAUZE/BANDAGES/DRESSINGS) ×8 IMPLANT
PAD ARMBOARD 7.5X6 YLW CONV (MISCELLANEOUS) ×4 IMPLANT
SPONGE GAUZE 4X4 12PLY STER LF (GAUZE/BANDAGES/DRESSINGS) ×8 IMPLANT
SPONGE LAP 18X18 X RAY DECT (DISPOSABLE) ×8 IMPLANT
SUT MNCRL AB 3-0 PS2 18 (SUTURE) ×24 IMPLANT
SUT MNCRL AB 4-0 PS2 18 (SUTURE) ×8 IMPLANT
SUT MON AB 2-0 CT1 36 (SUTURE) ×8 IMPLANT
SUT SILK 2 0 SH (SUTURE) ×4 IMPLANT
SUT VIC AB 3-0 SH 18 (SUTURE) ×4 IMPLANT
SYR CONTROL 10ML LL (SYRINGE) ×8 IMPLANT
TOWEL OR 17X24 6PK STRL BLUE (TOWEL DISPOSABLE) ×4 IMPLANT
TOWEL OR 17X26 10 PK STRL BLUE (TOWEL DISPOSABLE) ×4 IMPLANT

## 2014-11-11 NOTE — Op Note (Addendum)
Patient Name:           Laura Bush   Date of Surgery:        11/11/2014  Pre op Diagnosis:      Invasive ductal carcinoma left breast, upper inner quadrant, posterior Atypical ductal hyperplasia left breast, upper inner quadrant, anterior  Post op Diagnosis:    Same  Procedure:                 Inject blue dye left breast, Left partial mastectomy with double wire bracketed needle localization and specimen mammography, Left axillary sentinel lymph node biopsy  Surgeon:                     Edsel Petrin. Dalbert Batman, M.D., FACS  Assistant:                      Crissie Reese, M.D.  Operative Indications:   The patient is a 64 year old female who presents with breast cancer.  [facility]ially she was [facility] in [facility] by Dr. Jana Hakim, Dr. Lisbeth Renshaw, and me. No prior history. 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 MRI shows this mass but also showed a 5.5 cm area of linear enhancement extending anteriorly from the cancer, suspicious for DCIS. This clearly did not go all the way to the nipple, however. Her breasts are large and we wanted to consider lumpectomy. She went back and had a second biopsy from the anterior area and this shows atypical ductal hyperplasia. We spent a long time talking about options of mastectomy with or without reconstruction, sentinel node biopsy, double wire bracketed lumpectomy, symmetry procedures and the like. She would like to be conservative if possible. She knows that she may lose some volume on the left and that would be fine. She might need a reduction on the right and that would be fine with her as well since she is fairly large. I involved Dr. Crissie Reese in her care, and he thinks she is a candidate for bilateral reduction mammoplasty at the time of her lumpectomy and sentinel node biopsy. She is brought to the  operating room electively  Operative Findings:   I found 2 lymph nodes the left axilla. One was a clearly hot and blue sentinel node. Another one  was somewhat radioactive but with no blue color. This is all that was found. I took a large specimen, as predicted, and the specimen mammogram looked good showing both wires in their entirety and both marker clips seemingly within the center of the specimen.    The broad posterior margin is the pectoralis muscle.    . Procedure in Detail:          Both wires were inserted into the left breast this morning by Dr. Curlene Dolphin, and they were appropriately placed in a craniocaudad technique. In the holding area she underwent injection of radionuclide by the nuclear medicine technician. Her wire localization mammograms were reviewed. She was taken the operating room where a general anesthesia was induced. Surgical timeout was performed. Intravenous antibiotics were given. Following alcohol prep I injected 5 mL of blue dye in the left breast, subareolar area, and massage the breast for a few minutes. 0.5% Marcaine with epinephrine was used as a local infiltration anesthetic in the left axilla.  A transverse incision was made in the left axilla at the hairline. Dissection was carried down through the subcutaneous tissue. The clavipectoral fascia was incised and I entered the left axilla. Using the neoprobe I identified a single very hot and very blue lymph node and remove that as a sentinel node. Adjacent to this was a non-blue lymph node which had some  radioactivity and I removed that lymph node as well. There were no further lymph nodes that had any radioactivity in them. We irrigated this wound out. Hemostasis was excellent. The deeper tissues were closed with interrupted sutures of 3-0 Vicryl and the skin closed with a running subcuticular suture of 4-0 Monocryl and eventually Dermabond.     At this point in the case of Crissie Reese scrubbed in. We made the breast  reduction incisions and  isolated the nipple pedicle. I then performed the lumpectomy which was moderately extensive. I lifted the superior skin flaps up and dissected up trying to get at least a 1 cm thick flap. I encountered first the inferior placed wire and then the superiorly placed   wire and then dissected around all all the wires all the way down to and including the pectoralis fascia. This was a fairly generous specimen. The specimen was removed and marked with silk sutures and the multicolor ink. Specimen mammogram looked good. Both areas were marked and sent to the lab.     I marked the lumpectomy cavity with metal clips. Hemostasis was excellent. All counts were correct. EBL about 30 mL to this point. I scrubbed out at this point in the case and Dr. Harlow Mares will dictate the rest of the procedure.     Edsel Petrin. Dalbert Batman, M.D., FACS General and Minimally Invasive Surgery Breast and Colorectal Surgery  11/11/2014 2:00 PM

## 2014-11-11 NOTE — OR Nursing (Signed)
Specimen #3 has marker needles at D9 and H5 per Dr. Radford Pax at [facility].

## 2014-11-11 NOTE — Anesthesia Postprocedure Evaluation (Signed)
  Anesthesia Post-op Note  Patient: Laura Bush  Procedure(s) Performed: Procedure(s): LEFT BREAST PARTIAL MASTECTOMY WITH NEEDLE LOCALIZATION TIMES TWO AND LEFT AXILLARY SENTINEL LYMPH NODE Biopsy (Left) Bilateral Breast Reduction (Bilateral)  Patient Location: PACU  Anesthesia Type:General  Level of Consciousness: awake, alert  and oriented  Airway and Oxygen Therapy: Patient Spontanous Breathing and Patient connected to nasal cannula oxygen  Post-op Pain: mild  Post-op Assessment: Post-op Vital signs reviewed, Patient's Cardiovascular Status Stable, Respiratory Function Stable, Patent Airway and Pain level controlled  Post-op Vital Signs: stable  Last Vitals:  Filed Vitals:   11/11/14 1755  BP: 135/72  Pulse: 64  Temp: 36.3 C  Resp: 11    Complications: No apparent anesthesia complications

## 2014-11-11 NOTE — Anesthesia Preprocedure Evaluation (Addendum)
Anesthesia Evaluation  Patient identified by MRN, date of birth, ID band Patient awake    Reviewed: Allergy & Precautions, NPO status , Patient's Chart, lab work & pertinent test results, reviewed documented beta blocker date and time   History of Anesthesia Complications (+) PONV  Airway Mallampati: II   Neck ROM: full    Dental  (+) Dental Advisory Given   Pulmonary asthma , sleep apnea ,  breath sounds clear to auscultation        Cardiovascular hypertension, Pt. on medications and Pt. on home beta blockers + Peripheral Vascular Disease Rhythm:regular Rate:Normal     Neuro/Psych  Headaches,    GI/Hepatic GERD-  Medicated,  Endo/Other  Morbid obesity  Renal/GU      Musculoskeletal   Abdominal   Peds  Hematology   Anesthesia Other Findings   Reproductive/Obstetrics                            Anesthesia Physical Anesthesia Plan  ASA: II  Anesthesia Plan: General   Post-op Pain Management:    Induction: Intravenous  Airway Management Planned: Oral ETT  Additional Equipment:   Intra-op Plan:   Post-operative Plan: Extubation in OR  Informed Consent: I have reviewed the patients History and Physical, chart, labs and discussed the procedure including the risks, benefits and alternatives for the proposed anesthesia with the patient or authorized representative who has indicated his/her understanding and acceptance.     Plan Discussed with: CRNA, Anesthesiologist and Surgeon  Anesthesia Plan Comments:         Anesthesia Quick Evaluation

## 2014-11-11 NOTE — Anesthesia Procedure Notes (Signed)
Procedure Name: Intubation Date/Time: 11/11/2014 12:03 PM Performed by: Merrilyn Puma B Pre-anesthesia Checklist: Patient identified, Timeout performed, Emergency Drugs available, Suction available and Patient being monitored Patient Re-evaluated:Patient Re-evaluated prior to inductionOxygen Delivery Method: Circle system utilized Preoxygenation: Pre-oxygenation with 100% oxygen Intubation Type: IV induction and Cricoid Pressure applied Ventilation: Mask ventilation without difficulty Laryngoscope Size: Mac and 3 Grade View: Grade III Tube type: Oral Tube size: 7.0 mm Number of attempts: 1 Airway Equipment and Method: Stylet Placement Confirmation: CO2 detector,  positive ETCO2,  ETT inserted through vocal cords under direct vision and breath sounds checked- equal and bilateral Secured at: 21 cm Tube secured with: Tape Dental Injury: Teeth and Oropharynx as per pre-operative assessment

## 2014-11-11 NOTE — Transfer of Care (Signed)
Immediate Anesthesia Transfer of Care Note  Patient: Laura Bush  Procedure(s) Performed: Procedure(s): LEFT BREAST PARTIAL MASTECTOMY WITH NEEDLE LOCALIZATION TIMES TWO AND LEFT AXILLARY SENTINEL LYMPH NODE Biopsy (Left) Bilateral Breast Reduction (Bilateral)  Patient Location: PACU  Anesthesia Type:General  Level of Consciousness: awake and alert   Airway & Oxygen Therapy: Patient Spontanous Breathing and Patient connected to nasal cannula oxygen  Post-op Assessment: Report given to RN, Post -op Vital signs reviewed and stable and Patient moving all extremities X 4  Post vital signs: Reviewed and stable  Last Vitals:  Filed Vitals:   11/11/14 0909  BP: 129/59  Pulse: 50  Temp: 36.3 C  Resp: 20    Complications: No apparent anesthesia complications

## 2014-11-11 NOTE — Interval H&P Note (Signed)
History and Physical Interval Note:  11/11/2014 10:54 AM  Laura Bush  has presented today for surgery, with the diagnosis of cancer left breast  The various methods of treatment have been discussed with the patient and family. After consideration of risks, benefits and other options for treatment, the patient has consented to  Procedure(s): LEFT BREAST PARTIAL MASTECTOMY WITH NEEDLE LOCALIZATION X (2) AND LEFT AXILLARY SENTINEL LYMPH NODE BX (Left) BREAST REDUCTION WITH MASTOPEXY (Bilateral) --by (DR. Bowers)as a surgical intervention .  The patient's history has been reviewed, patient examined, no change in status, stable for surgery.  I have reviewed the patient's chart and labs.  Questions were answered to the patient's satisfaction.     [name]

## 2014-11-11 NOTE — Brief Op Note (Signed)
11/11/2014  4:35 PM  PATIENT:  Darionna W [name]  64 y.o. female  PRE-OPERATIVE DIAGNOSIS:  cancer left breast  POST-OPERATIVE DIAGNOSIS:  cancer left breast  PROCEDURE:  Procedure(s): LEFT BREAST PARTIAL MASTECTOMY WITH NEEDLE LOCALIZATION TIMES TWO AND LEFT AXILLARY SENTINEL LYMPH NODE Biopsy (Left) Bilateral Breast Reduction (Bilateral)  SURGEON:  Surgeon(s) and Role: Panel 1:    * Fanny Skates, MD - Primary  Panel 2:    * Crissie Reese, MD - Primary  PHYSICIAN ASSISTANT:   ASSISTANTS: none   ANESTHESIA:   general  EBL:  Total I/O In: 1800 [I.V.:1800] Out: 525 [Urine:450; Blood:75]  BLOOD ADMINISTERED:none  DRAINS: none   LOCAL MEDICATIONS USED:  NONE  SPECIMEN:  Source of Specimen:  bilateral breast tissue  DISPOSITION OF SPECIMEN:  PATHOLOGY  COUNTS:  YES  TOURNIQUET:  * No tourniquets in log *  DICTATION: .Other Dictation: Dictation Number [id number]  PLAN OF CARE: Admit for overnight observation  PATIENT DISPOSITION:  PACU - hemodynamically stable.   Delay start of Pharmacological VTE agent (>24hrs) due to surgical blood loss or risk of bleeding: no

## 2014-11-12 ENCOUNTER — Encounter: Payer: Self-pay | Admitting: Primary Care

## 2014-11-12 DIAGNOSIS — C50212 Malignant neoplasm of upper-inner quadrant of left female breast: Secondary | ICD-10-CM | POA: Diagnosis not present

## 2014-11-12 MED ORDER — HYDROMORPHONE HCL 2 MG PO TABS: 2.0000 mg | ORAL_TABLET | ORAL | Status: DC | PRN

## 2014-11-12 MED ORDER — ENOXAPARIN SODIUM 40 MG/0.4ML ~~LOC~~ SOLN: 40.0000 mg | SUBCUTANEOUS | Status: DC

## 2014-11-12 MED ORDER — METHOCARBAMOL 500 MG PO TABS: 500.0000 mg | ORAL_TABLET | Freq: Four times a day (QID) | ORAL | Status: DC

## 2014-11-12 MED ORDER — DOCUSATE SODIUM 100 MG PO CAPS: 100.0000 mg | ORAL_CAPSULE | Freq: Every day | ORAL | Status: DC

## 2014-11-12 NOTE — Progress Notes (Signed)
1 Day Post-Op  Subjective: Had some nausea and vomiting last night but feels pretty good this morning. Says pain is very manageable. ambulating to the bathroom, voiding without difficulty. Alert and hemodynamically stable. No bleeding reported.   Objective: Vital signs in last 24 hours: Temp:  [97.2 F (36.2 C)-98.4 F (36.9 C)] 98.4 F (36.9 C) (04/06 0550) Pulse Rate:  [50-85] 64 (04/06 0550) Resp:  [9-20] 16 (04/06 0550) BP: (117-140)/(59-76) 117/60 mmHg (04/06 0550) SpO2:  [95 %-99 %] 95 % (04/06 0550) Weight:  [88.905 kg (196 lb)] 88.905 kg (196 lb) (04/05 2224) Last BM Date: 11/10/14  Intake/Output from previous day: 04/05 0701 - 04/06 0700 In: 2440 [I.V.:2440] Out: 875 [Urine:800; Blood:75] Intake/Output this shift: Total I/O In: 580 [I.V.:580] Out: 350 [Urine:350]  General appearance: Alert and pleasant and oriented. Husband present in room. Resp: clear to auscultation bilaterally Breasts: normal appearance, no masses or tenderness, Compression dressing on both breasts. Upper skin flaps looked good. Did not completely take dressings down so did not see nipples. Left axillary incision soft without hematoma.  Lab Results:  No results found for this or any previous visit (from the past 24 hour(s)).   Studies/Results: Nm Sentinel Node Inj-no Rpt (breast)  11/11/2014   CLINICAL DATA: cancer left breast   Sulfur colloid was injected intradermally by the nuclear medicine  technologist for breast cancer sentinel node localization.    Mm Breast Surgical Specimen  11/11/2014   CLINICAL DATA:  Needle localization of 2 sites (bracketing) in the left breast prior lumpectomy.  EXAM: SPECIMEN RADIOGRAPH OF THE LEFT BREAST  COMPARISON:  Previous exam(s).  FINDINGS: Status post excision of the left breast. Two wire tips and 2 biopsy marker clips are present and are marked for pathology.  IMPRESSION: Specimen radiograph of the left breast.   Electronically Signed   By: Curlene Dolphin  M.D.   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 Patient presents for wire localization with bracketing of the 2 areas prior to lumpectomy.  EXAM: NEEDLE LOCALIZATION OF THE LEFT BREAST WITH MAMMO GUIDANCE  COMPARISON:  Previous exams.  FINDINGS: Patient presents for needle localization prior to lumpectomy. I met with the patient and we discussed the procedure of needle localization including benefits and alternatives. We discussed the high likelihood of a successful procedure. We discussed the risks of the procedure, including infection, bleeding, tissue injury, and further surgery. Informed, written consent was given. The usual time-out protocol was performed immediately prior to the procedure.  Using mammographic guidance, sterile technique, 2% lidocaine and 2 modified Kopans needles, biopsy-proven cancer more posteriorly with a heart shaped clip was localized with a 9 cm Kopan's needle using a superior to inferior approach. The more anterior, a dumbbell-shaped, biopsy clip was localized with a 7 cm a Kopan's needle using a superior to inferior approach. The images were marked for Dr. Dalbert Batman.  IMPRESSION: Needle localization (with bracketing) of the left breast. No apparent complications.   Electronically Signed   By: Curlene Dolphin M.D.   On: 11/11/2014 16:02    . aMILoride  5 mg Oral Daily  . amLODipine  5 mg Oral Daily  .  ceFAZolin (ANCEF) IV  1 g Intravenous 4 times per day  . docusate sodium  100 mg Oral Daily  . enoxaparin (LOVENOX)  injection  40 mg Subcutaneous Q24H  . hydrochlorothiazide  12.5 mg Oral Daily  . irbesartan  300 mg Oral Daily  . methocarbamol  500 mg Oral QID  . metoprolol succinate  50 mg Oral Daily  . simvastatin  20 mg Oral Daily     Assessment/Plan: s/p Procedure(s): LEFT BREAST PARTIAL MASTECTOMY WITH NEEDLE  LOCALIZATION TIMES TWO AND LEFT AXILLARY SENTINEL LYMPH NODE Biopsy Bilateral Breast Reduction  POD #1. Left breast lumpectomy with double wire localization, left XA sentinel node biopsy, and bilateral breast reduction.  Stable. Expect nausea and vomiting will be self-limited. Advance diet and activities  Home later today if tolerates diet and if okay with Dr. Harlow Mares.   I told her I would call the pathology report to her tomorrow or the next day once it is completed   She knows to follow-up with Dr. Gunnar Bulla Magrinat and Dr. Kyung Rudd.  Follow-up with me in the office in 2 weeks.   @PROBHOSP @     Sydni Elizarraraz M 11/12/2014  . .prob

## 2014-11-12 NOTE — Op Note (Signed)
NAME:  Laura Bush, Laura Bush               ACCOUNT NO.:  [id number]  MEDICAL RECORD NO.:  [id number]  LOCATION:  [facility]                          FACILITY:  [facility]  PHYSICIAN:  Crissie Reese, M.D.     DATE OF BIRTH:  08-30-1950  DATE OF PROCEDURE:  11/11/2014 DATE OF DISCHARGE:                              OPERATIVE REPORT   PREOPERATIVE DIAGNOSIS:  Left breast cancer.  POSTOPERATIVE DIAGNOSIS:  Left breast cancer.  PROCEDURE PERFORMED:  Bilateral breast reduction.  SURGEON:  Crissie Reese, M.D.  ANESTHESIA:  General.  ESTIMATED BLOOD LOSS:  150 mL.  CLINICAL NOTE:  A 64 year old woman has a left breast cancer and this is located in the superior pole of the left breast.  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  Due to the size of this, the breast reduction pattern was felt to be the best approach and General Surgery requested a Plastic Surgery consultation for that.  After extensive counseling with the patient, she did desire reduction of approximately 1/2 of her breast volume.  She understood that there would be radiation on the left side and that this would elevate the left side and cause that breast to become a little bit smaller and my intention we would plan on leaving the left breast a little bit larger and a little bit lower than the right breast.  She agreed with this overall plan and it was decided to resect somewhere around 400 to 450 g of tissue with a little more from the right side as compared to the left.  The nature of this procedure and risks, possible complications were discussed with her in detail. These risks included, but not limited to, bleeding, infection, healing problems, scarring, loss of sensation, loss of sensation in the nipple, anesthesia related complications, DVT, PE, contour deformities, loss of pigment in the nipples, loss of tissue, loss of nipple, loss of skin, loss of fatty tissue, asymmetry,  and overall disappointment.  She understood all this and wished to proceed.  The plan was to perform this procedure in conjunction with Dr. Darrel Hoover partial mastectomy on the left side for the cancer.  DESCRIPTION OF PROCEDURE:  The patient had been marked previously in a full standing position.  She was already in the operating room and the sentinel lymph node biopsy had been performed.  The left breast was approached first with an 8 cm wide inferior nipple-areolar pedicle designed and then 38 mm cookie cutter used to mark the nipple-areolar complex.  Incisions were made.  Inferior pedicle de-epithelialized and the pedicle was isolated from surrounding tissue, beveling outward both medial and lateral in order to ensure a much broader attachment at the level of chest wall than at the level of the skin.  At the conclusion of this dissection, nipple complex was inspected and found to have bright red bleeding in its periphery, consistent with viability.  The resections were then performed first centrally, and Dr. Dalbert Batman took over at this point, please see his operative note.  Addition resections were then performed lateral and medial.  Thorough irrigation with saline and meticulous hemostasis with the electrocautery and excellent  hemostasis having been assured, the closure with 2-0 and 3-0 Monocryl interrupted inverted deep dermal sutures and running 3-0 Monocryl subcuticular suture.  A measurement was then taken 5 cm up from the inframammary crease and the 38 mm marker marked the nipple-areolar complex.  The tissue resected here and the nipple-areolar complex brought through this opening and the nipple was inspected and found to have excellent color and bright red bleeding at its periphery, consistent with viability. The total resection for the left side 402 g.  Thorough irrigation with saline and hemostasis with electrocautery and then the nipple was inset with 3-0 Monocryl in interrupted  inverted deep dermal sutures and running 4-0 Monocryl subcuticular suture.  Gowns and gloves were then changed and new instruments were used for the right side, where in similar fashion, the 8 cm wide inferior nipple- areolar pedicle was designed and the nipple marker marked the nipple- areolar complex.  Incision was made.  Inferior pedicle de-epithelialized and the inferior pedicle was then isolated from surrounding tissues, beveling outward medial and lateral in order to ensure broader attachment at the level of the chest wall than it was present at the level of the skin.  This was done again to ensure good blood supply to the nipple-areolar complex which was noted to be excellent with bright red bleeding at the periphery, consistent with viability.  The resections were performed medial, central, and lateral and total of 437 g from this right side by intention, a little bit more due to the planned radiation for the left side and the effects that it would have on the soft tissues.  Thorough irrigation with saline.  Meticulous hemostasis with electrocautery.  The closure with 2-0 and 3-0 Monocryl interrupted inverted deep dermal sutures and running 3-0 Monocryl subcuticular suture.  A measurement was taken 5 cm up from the inframammary crease and the nipple marker marked the site of the nipple- areolar complex.  This tissue resected in the nipple-areolar complex brought through this opening.  Thorough irrigation with saline and meticulous hemostasis again with electrocautery.  Nipple complex again was inspected and found to have excellent color and bright red bleeding at its periphery, consistent with viability.  Nipple-areolar insetting 3- 0 Monocryl interrupted inverted deep dermal sutures and running 4-0 Monocryl subcuticular suture.  This appeared to give her the desired result with the left breast a little bit larger than the right and the left nipple-areolar complex just a little  bit lower than right. Dermabond was applied.  Dry sterile dressings, a circumferential Ace wrap, and she was transferred to recovery in stable, having tolerated the procedure well.  DISPOSITION:  She will be observed overnight.     Crissie Reese, M.D.     DB/MEDQ  D:  11/11/2014  T:  11/12/2014  Job:  [id number]

## 2014-11-12 NOTE — Discharge Summary (Signed)
Physician Discharge Summary  Patient ID: Laura Bush MRN: [id number] DOB/AGE: [birth year] 64 y.o.  Admit date: 11/11/2014 Discharge date: 11/12/2014  Admission Diagnoses: Left breast cancer  Discharge Diagnoses: Same Active Problems:   Breast cancer, female   Discharged Condition: good  Hospital Course: On the day of admission the patient was taken to surgery and had left partial mastectomy, sentinel node, and bilateral breast reduction. The patient tolerated the procedures well. Postoperatively, the nipple complexes maintained excellent color and capillary refill.Both are sensate and appear viable. The patient was ambulatory and tolerating diet on the first postoperative day. DVT prophylaxis continues. She is ready for discharge..  Treatments: antibiotics: Ancef, anticoagulation: LMW heparin and surgery: left partial mastectomy, sentinel node, and bilateral breast reduction  Discharge Exam: Blood pressure 117/60, pulse 64, temperature 98.4 F (36.9 C), temperature source Oral, resp. rate 16, height 4\' 11"  (1.499 m), weight 196 lb (88.905 kg), SpO2 95 %.  Operative sites: Chest soft bilateral. Nipple complexes have good color, are sensate, and appear viable. There is no evidence of bleeding or infection either side.  Disposition:  Discharge home.      Medication List    STOP taking these medications        omega-3 fish oil 1000 MG Caps capsule  Commonly known as:  MAXEPA     SUPER B COMPLEX PO      TAKE these medications        acetaminophen 500 MG tablet  Commonly known as:  TYLENOL  Take 500 mg by mouth every 6 (six) hours as needed.     aMILoride 5 MG tablet  Commonly known as:  MIDAMOR  Take 5 mg by mouth daily.     docusate sodium 100 MG capsule  Commonly known as:  COLACE  Take 1 capsule (100 mg total) by mouth daily.     enoxaparin 40 MG/0.4ML injection  Commonly known as:  LOVENOX  Inject 0.4 mLs (40 mg total) into the skin daily.     hydrochlorothiazide 12.5 MG capsule  Commonly known as:  MICROZIDE  Take 12.5 mg by mouth daily.     HYDROmorphone 2 MG tablet  Commonly known as:  DILAUDID  Take 1-2 tablets (2-4 mg total) by mouth every 4 (four) hours as needed for severe pain.     ketoconazole 2 % shampoo  Commonly known as:  NIZORAL  Apply 1 application topically 2 (two) times a week.     methocarbamol 500 MG tablet  Commonly known as:  ROBAXIN  Take 1 tablet (500 mg total) by mouth 4 (four) times daily.     metoprolol succinate 50 MG 24 hr tablet  Commonly known as:  TOPROL-XL  Take 50 mg by mouth daily. Take with or immediately following a meal.     pantoprazole 20 MG tablet  Commonly known as:  PROTONIX  Take 20 mg by mouth daily as needed for heartburn.     simvastatin 20 MG tablet  Commonly known as:  ZOCOR  Take 20 mg by mouth daily.     Telmisartan-Amlodipine 80-5 MG Tabs  Take 1 tablet by mouth daily.     Vitamin D3 2000 UNITS Tabs  Take 1 tablet by mouth daily.           Follow-up Information    Follow up with [name], MD. Schedule an appointment as soon as possible for a visit in 2 weeks.   Specialty:  General Surgery   Contact information:   [phone]  [address] [place] [address] [phone]       Signed: Macon Large 11/12/2014, 7:45 AM

## 2014-11-12 NOTE — Discharge Instructions (Addendum)
No lifting for 6 weeks No vigorous activity for 6 weeks (including outdoor walks) No driving for 4 weeks OK to walk up stairs slowly Stay propped up Use incentive spirometer at home every hour while awake No shower until Friday or Saturday. At that point it is okay to remove dressings and shower. It is okay for water to run over the incisions. After shower, pat dry gently and place sports bra. Be certain to pad in the crease under each breast to avoid irritation of incisions by the bra. Sanitary napkins work well for this. Avoid lifting, exercising, raising arms overhead, and walking outdoors. Continue the Lovenox sots at home on Thursday morning and do one daily until they are finished. Continue to avoid Advil, Motrin, Aspirin, fish oil , and other supplements that cause bleeding. We discussed these prior to surgery Take an over-the-counter stool softener (such as Colace) while on pain medication  See Dr. Harlow Mares next week For questions call [phone] or [phone]

## 2014-11-12 NOTE — Progress Notes (Signed)
Discharge paperwork reviewed with patient. No questions verbalized. Patient is dressed and ready to be wheeled out.

## 2014-11-17 ENCOUNTER — Telehealth: Payer: Self-pay | Admitting: *Deleted

## 2014-11-17 NOTE — Progress Notes (Signed)
Quick Note:  Inform patient of Pathology report,.Good news. Negative margins and negative nodes. Be sure she knows.  hmi ______

## 2014-11-17 NOTE — Telephone Encounter (Signed)
Received oncotype order from Dr. Jana Hakim. Requisition sent to pathology. Received by Alyse Low.

## 2014-11-24 ENCOUNTER — Telehealth: Payer: Self-pay | Admitting: *Deleted

## 2014-11-24 NOTE — Telephone Encounter (Signed)
Received oncotype score of 11/8%. Original to [facility] for scanning. Copy given to Dr. Jana Hakim.

## 2014-12-01 ENCOUNTER — Other Ambulatory Visit (HOSPITAL_COMMUNITY): Payer: 59

## 2014-12-01 ENCOUNTER — Other Ambulatory Visit: Payer: Self-pay | Admitting: Oncology

## 2014-12-02 ENCOUNTER — Other Ambulatory Visit: Payer: Self-pay | Admitting: *Deleted

## 2014-12-02 DIAGNOSIS — C50412 Malignant neoplasm of upper-outer quadrant of left female breast: Secondary | ICD-10-CM

## 2014-12-02 DIAGNOSIS — C50919 Malignant neoplasm of unspecified site of unspecified female breast: Secondary | ICD-10-CM

## 2014-12-03 ENCOUNTER — Encounter: Payer: Self-pay | Admitting: *Deleted

## 2014-12-03 ENCOUNTER — Telehealth: Payer: Self-pay | Admitting: Oncology

## 2014-12-03 ENCOUNTER — Other Ambulatory Visit (HOSPITAL_BASED_OUTPATIENT_CLINIC_OR_DEPARTMENT_OTHER): Payer: 59

## 2014-12-03 ENCOUNTER — Ambulatory Visit (HOSPITAL_BASED_OUTPATIENT_CLINIC_OR_DEPARTMENT_OTHER): Payer: 59 | Admitting: Oncology

## 2014-12-03 VITALS — BP 107/53 | HR 66 | Temp 97.8°F | Resp 18 | Ht 59.0 in | Wt 189.5 lb

## 2014-12-03 DIAGNOSIS — E7800 Pure hypercholesterolemia, unspecified: Secondary | ICD-10-CM

## 2014-12-03 DIAGNOSIS — C50412 Malignant neoplasm of upper-outer quadrant of left female breast: Secondary | ICD-10-CM

## 2014-12-03 DIAGNOSIS — I10 Essential (primary) hypertension: Secondary | ICD-10-CM | POA: Diagnosis not present

## 2014-12-03 DIAGNOSIS — C50919 Malignant neoplasm of unspecified site of unspecified female breast: Secondary | ICD-10-CM

## 2014-12-03 LAB — COMPREHENSIVE METABOLIC PANEL (CC13)
ALT: 18 U/L (ref 0–55)
AST: 14 U/L (ref 5–34)
Albumin: 3.9 g/dL (ref 3.5–5.0)
Alkaline Phosphatase: 57 U/L (ref 40–150)
Anion Gap: 12 mEq/L — ABNORMAL HIGH (ref 3–11)
BILIRUBIN TOTAL: 0.51 mg/dL (ref 0.20–1.20)
BUN: 13.4 mg/dL (ref 7.0–26.0)
CO2: 24 meq/L (ref 22–29)
Calcium: 9.7 mg/dL (ref 8.4–10.4)
Chloride: 105 mEq/L (ref 98–109)
Creatinine: 1 mg/dL (ref 0.6–1.1)
EGFR: 61 mL/min/{1.73_m2} — ABNORMAL LOW (ref >90)
Glucose: 125 mg/dl (ref 70–140)
Potassium: 3.8 mEq/L (ref 3.5–5.1)
SODIUM: 141 meq/L (ref 136–145)
Total Protein: 7 g/dL (ref 6.4–8.3)

## 2014-12-03 LAB — CBC WITH DIFFERENTIAL/PLATELET
BASO%: 1.1 % (ref 0.0–2.0)
Basophils Absolute: 0 10*3/uL (ref 0.0–0.1)
EOS ABS: 0.3 10*3/uL (ref 0.0–0.5)
EOS%: 7.3 % — ABNORMAL HIGH (ref 0.0–7.0)
HCT: 38.3 % (ref 34.8–46.6)
HEMOGLOBIN: 12.6 g/dL (ref 11.6–15.9)
LYMPH%: 28.2 % (ref 14.0–49.7)
MCH: 31.4 pg (ref 25.1–34.0)
MCHC: 32.9 g/dL (ref 31.5–36.0)
MCV: 95.6 fL (ref 79.5–101.0)
MONO#: 0.3 10*3/uL (ref 0.1–0.9)
MONO%: 8.5 % (ref 0.0–14.0)
NEUT%: 54.9 % (ref 38.4–76.8)
NEUTROS ABS: 2 10*3/uL (ref 1.5–6.5)
PLATELETS: 215 10*3/uL (ref 145–400)
RBC: 4.01 10*6/uL (ref 3.70–5.45)
RDW: 12.3 % (ref 11.2–14.5)
WBC: 3.7 10*3/uL — ABNORMAL LOW (ref 3.9–10.3)
lymph#: 1 10*3/uL (ref 0.9–3.3)

## 2014-12-03 NOTE — Progress Notes (Signed)
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 While still there, she had routine screening mammography with tomography 02/14/2014, which showed only a tiny breast cysts. Six-month follow-up exam was performed 08/25/2014, and now there was evidence of a possible focal asymmetry in the left breast. On 08/25/2014 at the [facility] of radiology she underwent a unilateral diagnostic mammography with tomography , and this confirmed a spiculated mass in the left breast superiorly. Sonography of this area found a poorly defined hypoechoic mass measuring 0.7 cm at the 11:00 position 8 cm from the nipple. There was also an adjacent cluster of cysts with a benign circumcised appearance.   Biopsy of the mass in question was recommended, but as the patient was moving to this area, this was performed here on 09/18/2014. The pathology from that procedure ([id number]) found an invasive ductal carcinoma, grade 2, estrogen receptor 100% positive, with strong staining intensity, progesterone receptor 43% positive, with strong staining intensity,  with an MIB-1 of 30%, and no HER-2 amplification.    on 09/26/2014 the patient underwent bilateral breast MRI at [facility] imaging, showing a breast density category B. The right breast was unremarkable and there were no lymph nodes of concern. In the upper inner quadrant of the left breast there was an enhancing mass measuring 1.2 cm. In addition there was a 5.5 cm area of linear enhancement extending anteriorly from this mass. 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   The patient's subsequent history is as detailed below  INTERVAL HISTORY:  Laura Bush returns today for follow-up of her breast cancer(s) accompanied by her husband Louie Casa.  Since her last visit here Laura Bush underwent biopsy of the area of non-masslike enhancement in the left breast and this showed atypical ductal hyperplasia ([id number]). She then proceeded to left lumpectomy and sentinel lymph node sampling 11/11/2014. The pathology ([id number]) showed a 1.4 cm invasive ductal carcinoma, grade 3, with both lymph nodes (one being a sentinel lymph node) clear. Repeat HER-2 was again negative, with a signals ratio of 1.18, and the number per cell being 1.95.  An Oncotype from the left breast tumor showed a score of 11, predicting an 8% risk of recurrence outside the breast within 10 years if the patient's only systemic therapy was tamoxifen for 5 years. It also predicts no benefit from chemotherapy.  Unexpectedly however, the pathology from the patient's right reduction mammoplasty showed ductal carcinoma in situ. This was discussed in conference 12/03/2014. There is no way to obtain clear margins, as it is not clear where the DCIS was. The tissue was submitted in fragments. The ductal carcinoma in situ was read as intermediate grade, with necrosis and calcifications. There was no  evidence of invasive malignancy. Prognostic panel is pending on this.  REVIEW OF SYSTEMS: Laura Bush did well with the surgery, without unusual pain,  bleeding, or fever. She does have some discomfort in the left axilla at times. She describes this as stabbing and very brief period just sinus problems and a little bit of heartburn. A detailed review of systems today was otherwise entirely negative.  PAST MEDICAL HISTORY: Past Medical History  Diagnosis Date  . Hypertension   . Venous insufficiency   . Asthma     in cold weather  . GERD (gastroesophageal reflux disease)   . Microscopic colitis   . Rosacea   . PONV (postoperative nausea and vomiting)   . High cholesterol   . Heart murmur     as a child (has outgrown)  . Pneumonia 2000's X 1  . OSA on CPAP   . Cluster headaches     in her 40's  . Breast cancer of upper-outer quadrant of left female breast 09/22/2014  . 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 Cesarean section  1978; 1980  . Tubal ligation  ?1984  . Abdominal hysterectomy  1999  . Knee arthroscopy Bilateral     meniscus repair  . Colonoscopy    . Carpal tunnel release Bilateral     2 surgeries on right, 1 on left  . Forearm fracture surgery Right 2003 X 3  . Partial mastectomy with axillary sentinel lymph node biopsy Left 11/11/2014  . Mastectomy complete / simple w/ sentinel node biopsy Left 11/11/2014  . Tonsillectomy  ~ 1957  . Appendectomy  ~ 2006  . Fracture surgery    . Reduction mammaplasty Bilateral 11/11/2014  . Breast biopsy Left 10/2014; 10/2014  . Squamous cell carcinoma excision  1980's X 1    "nose"  . Partial mastectomy with needle localization and axillary sentinel lymph node bx Left 11/11/2014    Procedure: LEFT BREAST PARTIAL MASTECTOMY WITH NEEDLE LOCALIZATION TIMES TWO AND LEFT AXILLARY SENTINEL LYMPH NODE Biopsy;  Surgeon: Fanny Skates, MD;  Location: [place];  Service: General;  Laterality: Left;  . Breast reduction surgery Bilateral 11/11/2014    Procedure: Bilateral Breast Reduction;  Surgeon: Crissie Reese, MD;   Location: [place];  Service: Plastics;  Laterality: Bilateral;    FAMILY HISTORY Family History  Problem Relation Age of Onset  . Uterine cancer Sister 16  . Stroke Father     deceased  . Dementia Mother     Lives in [place]   the patient's father died at the age of 35 following a stroke. The patient's mother is still living, age 93. GEN had no brothers, one sister. That sister was diagnosed with uterine cancer at the age of 82. There is no other history of breast or ovarian cancer in the family to the patient's knowledge  GYNECOLOGIC HISTORY:  No LMP recorded. Patient has had a hysterectomy.  menarche age 25, first live birth age 50, the patient is [place] P2.  She underwent total abdominal hysterectomy with bilateral salpingo-oophorectomy in 1999. She took hormone replacement for approximately 4 years. 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 Their daughter Luane School lives in [place] where she works as an Web designer. Their son Robert Bush lives in  [place] where he works as a Education officer, museum. Herbie Baltimore is expecting twins.    ADVANCED DIRECTIVES:  Not in place. The patient has the appropriate forms to complete and notarize at her discretion.   HEALTH MAINTENANCE: History  Substance Use Topics  . Smoking status: Never Smoker   . Smokeless tobacco: Never Used  . Alcohol Use: 2.4 oz/week    4 Glasses of wine per week     Colonoscopy:  2008  PAP:  Bone density: 2007/"normal"  Lipid panel:  Allergies  Allergen Reactions  . Lanolin   . Tape Other (See Comments)    Redness (Bandaids also)    Current Outpatient Prescriptions  Medication Sig Dispense Refill  . acetaminophen (TYLENOL) 500 MG tablet Take 500 mg by mouth every 6 (six) hours as needed.    Marland Kitchen aMILoride (MIDAMOR) 5 MG tablet Take 5 mg by mouth  daily.    . Cholecalciferol (VITAMIN D3) 2000 UNITS TABS Take 1 tablet by mouth daily.    Marland Kitchen docusate sodium (COLACE) 100 MG capsule Take 1 capsule (100 mg total) by mouth daily. 10 capsule 0  . enoxaparin (LOVENOX) 40 MG/0.4ML injection Inject 0.4 mLs (40 mg total) into the skin daily. 12 Syringe 0  . hydrochlorothiazide (MICROZIDE) 12.5 MG capsule Take 12.5 mg by mouth daily.    Marland Kitchen HYDROmorphone (DILAUDID) 2 MG tablet Take 1-2 tablets (2-4 mg total) by mouth every 4 (four) hours as needed for severe pain. 40 tablet 0  . ketoconazole (NIZORAL) 2 % shampoo Apply 1 application topically 2 (two) times a week.    . methocarbamol (ROBAXIN) 500 MG tablet Take 1 tablet (500 mg total) by mouth 4 (four) times daily. 40 tablet 1  . metoprolol succinate (TOPROL-XL) 50 MG 24 hr tablet Take 50 mg by mouth daily. Take with or immediately following a meal.    . pantoprazole (PROTONIX) 20 MG tablet Take 20 mg by mouth daily as needed for heartburn.     . simvastatin (ZOCOR) 20 MG tablet Take 20 mg by mouth daily.    . Telmisartan-Amlodipine 80-5 MG TABS Take 1 tablet by mouth daily.  3   No current facility-administered medications for this visit.    OBJECTIVE:  Middle-aged white woman in no acute distress Filed Vitals:   12/03/14 0850  BP: 107/53  Pulse: 66  Temp: 97.8 F (36.6 C)  Resp: 18     Body mass index is 38.25 kg/(m^2).    ECOG FS:1 - Symptomatic but completely ambulatory  Sclerae unicteric, pupils round and equal Oropharynx clear and moist-- no thrush or other lesions No cervical or supraclavicular adenopathy Lungs no rales or rhonchi Heart regular rate and rhythm Abd soft, nontender, positive bowel sounds MSK no focal spinal tenderness, no upper extremity lymphedema Neuro: nonfocal, well oriented, appropriate affect Breasts: The right breast is status post recent reduction mammoplasty. The cosmetic result is good. There are no suspicious findings. The right axilla is benign. The left  breast is status post lumpectomy. Because medical result is excellent. There is no evidence of local recurrence. The incisions are healing nicely. The left axilla is benign.  LAB RESULTS:  CMP     Component Value Date/Time   NA 139 11/05/2014 1142   NA 142 10/01/2014 1230   K 3.7 11/05/2014 1142   K 4.2 10/01/2014 1230   CL 102 11/05/2014 1142   CO2 28 11/05/2014 1142   CO2 28 10/01/2014 1230   GLUCOSE 98 11/05/2014 1142   GLUCOSE 102  10/01/2014 1230   BUN 14 11/05/2014 1142   BUN 16.3 10/01/2014 1230   CREATININE 0.89 11/05/2014 1142   CREATININE 0.9 10/01/2014 1230   CALCIUM 9.7 11/05/2014 1142   CALCIUM 10.5* 10/01/2014 1230   PROT 6.9 11/05/2014 1142   PROT 7.2 10/01/2014 1230   ALBUMIN 4.1 11/05/2014 1142   ALBUMIN 4.1 10/01/2014 1230   AST 23 11/05/2014 1142   AST 18 10/01/2014 1230   ALT 31 11/05/2014 1142   ALT 28 10/01/2014 1230   ALKPHOS 52 11/05/2014 1142   ALKPHOS 54 10/01/2014 1230   BILITOT 0.4 11/05/2014 1142   BILITOT 0.41 10/01/2014 1230   GFRNONAA 68* 11/05/2014 1142   GFRAA 78* 11/05/2014 1142    INo results found for: SPEP, UPEP  Lab Results  Component Value Date   WBC 3.7* 12/03/2014   NEUTROABS 2.0 12/03/2014   HGB 12.6 12/03/2014   HCT 38.3 12/03/2014   MCV 95.6 12/03/2014   PLT 215 12/03/2014      Chemistry      Component Value Date/Time   NA 139 11/05/2014 1142   NA 142 10/01/2014 1230   K 3.7 11/05/2014 1142   K 4.2 10/01/2014 1230   CL 102 11/05/2014 1142   CO2 28 11/05/2014 1142   CO2 28 10/01/2014 1230   BUN 14 11/05/2014 1142   BUN 16.3 10/01/2014 1230   CREATININE 0.89 11/05/2014 1142   CREATININE 0.9 10/01/2014 1230      Component Value Date/Time   CALCIUM 9.7 11/05/2014 1142   CALCIUM 10.5* 10/01/2014 1230   ALKPHOS 52 11/05/2014 1142   ALKPHOS 54 10/01/2014 1230   AST 23 11/05/2014 1142   AST 18 10/01/2014 1230   ALT 31 11/05/2014 1142   ALT 28 10/01/2014 1230   BILITOT 0.4 11/05/2014 1142   BILITOT 0.41  10/01/2014 1230       No results found for: LABCA2  No components found for: LABCA125  No results for input(s): INR in the last 168 hours.  Urinalysis    Component Value Date/Time   COLORURINE YELLOW 11/05/2014 1142   APPEARANCEUR CLEAR 11/05/2014 1142   LABSPEC 1.008 11/05/2014 1142   PHURINE 7.0 11/05/2014 1142   GLUCOSEU NEGATIVE 11/05/2014 1142   HGBUR NEGATIVE 11/05/2014 1142   BILIRUBINUR NEGATIVE 11/05/2014 1142   KETONESUR NEGATIVE 11/05/2014 1142   PROTEINUR NEGATIVE 11/05/2014 1142   UROBILINOGEN 0.2 11/05/2014 1142   NITRITE NEGATIVE 11/05/2014 1142   LEUKOCYTESUR NEGATIVE 11/05/2014 1142    STUDIES: Nm Sentinel Node Inj-no Rpt (breast)  11/11/2014   CLINICAL DATA: cancer left breast   Sulfur colloid was injected intradermally by the nuclear medicine  technologist for breast cancer sentinel node localization.    Mm Breast Surgical Specimen  11/11/2014   CLINICAL DATA:  Needle localization of 2 sites (bracketing) in the left breast prior lumpectomy.  EXAM: SPECIMEN RADIOGRAPH OF THE LEFT BREAST  COMPARISON:  Previous exam(s).  FINDINGS: Status post excision of the left breast. Two wire tips and 2 biopsy marker clips are present and are marked for pathology.  IMPRESSION: Specimen radiograph of the left breast.   Electronically Signed   By: Curlene Dolphin M.D.   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 Patient presents for wire localization with bracketing of the 2 areas prior to  lumpectomy.  EXAM: NEEDLE LOCALIZATION OF THE LEFT BREAST WITH MAMMO GUIDANCE  COMPARISON:  Previous exams.  FINDINGS: Patient presents for needle localization prior to lumpectomy. I met with the patient and we discussed the procedure of needle localization including benefits and alternatives. We discussed the high likelihood of a successful  procedure. We discussed the risks of the procedure, including infection, bleeding, tissue injury, and further surgery. Informed, written consent was given. The usual time-out protocol was performed immediately prior to the procedure.  Using mammographic guidance, sterile technique, 2% lidocaine and 2 modified Kopans needles, biopsy-proven cancer more posteriorly with a heart shaped clip was localized with a 9 cm Kopan's needle using a superior to inferior approach. The more anterior, a dumbbell-shaped, biopsy clip was localized with a 7 cm a Kopan's needle using a superior to inferior approach. The images were marked for Dr. Dalbert Batman.  IMPRESSION: Needle localization (with bracketing) of the left breast. No apparent complications.   Electronically Signed   By: Curlene Dolphin M.D.   On: 11/11/2014 16:02    ASSESSMENT: 64 y.o. [place], [place] woman status post left breast upper outer quadrant biopsy 09/18/2014 for a clinical T1 cN0 invasive ductal carcinoma, grade 2, estrogen and progesterone receptor positive, HER-2 not amplified, with an MIB-1 of 30%  (1) there is a 5.5 cm area of non-masslike enhancement seen in the left breast with biopsy 10/07/2014 showing atypical ductal hyperplasia  (2) status post left lumpectomy and sentinel lymph node sampling 11/11/2014 for a  pT1c  PN0, stage IA invasive ductal carcinoma, grade 3, repeat HER-2 again negative, with negative margins  (3) an Oncotype DX score of 11 predicts an 8% risk of outside the breast recurrence within 10 years if the patient's only systemic therapy is tamoxifen for 5 years. It also predicts no benefit from chemotherapy  (4). Pathology from right reduction mammoplasty 11/11/2014 unexpectedly showed ductal carcinoma in situ. Prognostic panel pending  (5) adjuvant radiation to the left breast pending  ( 6) the patient will benefit from antiestrogen's after the completion of her local treatment.  PLAN: I spent approximately 45 minutes today with  Laura Bush and her husband going over her situation. On the left side, treatment is quite standard. Given the low Oncotype score, she will not benefit from chemotherapy. She will receive adjuvant radiation and then see me to start anti-estrogens. With current standard antiestrogen therapy I quoted a 5% risk of outside the breast recurrence within the next 10 years after this treatment.  The situation on the right is more complicated. He was discussed extensively today at conference. Because the tissue was an fragments, going back for margins is not possible. We do not have data on the natural history of mammographically silent ductal carcinoma in situ. Likely this is present in a certain percentage of women and in many of them it never leads to any significant finding or disease.   Because we cannot go back for clear margins, surgically the only option would be mastectomy. This seems excessive. Radiation in this setting is poorly reported and in a way it would be like irradiating a normal breast since we don't have anything to follow mammographically or by MRI. Nevertheless that also remains an option.  I think the better choice would be to go on anti-estrogens, which is the plan in any case because of the left sided invasive tumor. We would then watch the right side carefully, with MRIs yearly as well as yearly mammography. If there were any developments we  would proceed as per the usual protocols. The only catch on this plan is that I do not know whether her ductal carcinoma in situ on the right is estrogen receptor or not. If it is not, I would recommend radiation although very close observation remains an option.  Laura Bush has a good understanding of all this. She will meet with Dr. Lisbeth Renshaw next week and by then we certainly should have the results of her prognostic panel on the right side. I am making her a return appointment with me in June so we can finalize a decision regarding which antiestrogen to start  on.   Marland KitchenChauncey Cruel, MD   12/03/2014 9:07 AM Medical Oncology and Hematology [facility] [address] [place], [place] [address] Tel. [phone]    Fax. [phone]

## 2014-12-03 NOTE — Telephone Encounter (Signed)
appointments made and avs printed for patient °

## 2014-12-03 NOTE — Progress Notes (Signed)
Met with pt post op with Dr. Jana Hakim. Relate she is doing well and that her skin is "itchy" where the glue is coming off. Informed pt that was a normal s/e. Denies needs at this time. Discussed FYNN and other support programs offered at [facility].

## 2014-12-04 LAB — PTH, INTACT AND CALCIUM
CALCIUM: 9.1 mg/dL (ref 8.4–10.5)
PTH: 29 pg/mL (ref 14–64)

## 2014-12-04 LAB — VITAMIN D 25 HYDROXY (VIT D DEFICIENCY, FRACTURES): VIT D 25 HYDROXY: 34 ng/mL (ref 30–100)

## 2014-12-05 NOTE — Progress Notes (Signed)
Addendum: The ductal carcinoma in situ on the right side is estrogen receptor 90% positive, progesterone receptor 70% positive.

## 2014-12-09 ENCOUNTER — Encounter: Payer: Self-pay | Admitting: Radiation Oncology

## 2014-12-09 ENCOUNTER — Encounter: Payer: Self-pay | Admitting: Primary Care

## 2014-12-09 NOTE — Progress Notes (Signed)
Location of Breast Cancer: Left  Breast   Histology per Pathology Report: 09/18/2014:Diagnosis Breast, left, needle core biopsy, mass, 11 o'clock- INVASIVE DUCTAL CARCINOMA.- DUCTAL CARCINOMA IN SITU  Receptor Status: ER(+ 90%), PR ( + 70%), Her2-neu (neg  )  Did patient present with symptoms (if so, please note symptoms) or was this found on screening mammography?: routine mammogram with tomography 02/14/14, 08/25/14 mammography with tomography   Past/Anticipated interventions by surgeon, if BEE:[id number] 11/11/2014: 1. Lymph node, biopsy, Left axilla- ONE LYMPH NODE, NEGATIVE FOR TUMOR (0/1). 2. Lymph node, sentinel, biopsy, Left axilla- ONE LYMPH NODE, NEGATIVE FOR TUMOR (0/1). 3. Breast, Lumpectomy, Left - INVASIVE DUCTAL CARCINOMA, SEE COMMENT.- DUCTAL CARCINOMA IN SITU.- IN SITU CARCINOMA IS 1 CM FROM NEAREST MARGIN (MEDIAL).- PREVIOUS BIOPSY SITES (X2).- SEE TUMOR SYNOPTIC TEMPLATE BELOW. 4. Breast, Mammoplasty, left - BENIGN BREAST TISSUE, SEE COMMENT.- NEGATIVE FOR ATYPIA OR MALIGNANCY.  5. Breast, Mammoplasty, right - DUCTAL CARCINOMA IN SITU WITH NECROSIS AND CALCIFICATIONS, SEE COMMENT.Dr. Fanny Skates, MD Will see Dr. Dalbert Batman next Monday 12/22/14, Dr. Harlow Mares in 4 weeks Past/Anticipated interventions by medical oncology, if any: Chemotherapy : Seen breast clinic 10/01/2014 Dr. Jana Hakim, Oncotype result= 11 last seen 12/03/14   Lymphedema issues, if any: none  Pain issues, if any: left axilla discomfort ,soreness, incisions well healed, seroma?omn breast,left,   SAFETY ISSUES:  Prior radiation? NO  Pacemaker/ICD? NO  Possible current pregnancy? NO  Is the patient on methotrexate? NO  Current Complaints / other details:  Married,  Menarche age 69, GxP2,(C-section x2)1st live birth age 36, total Hysterectomy  1999, HRT 4 years, oral contraceptives 8 years  Non smoker, 4 glasses wine weekly   Sister  Uterine cancer(dx age 67)  , father died stroke age 8, mother living in  [place],(dementia)age 24\  Allergies:Lanolin, tape  Shaina Gullatt, Felicita Gage, RN 12/09/2014,2:14 PM

## 2014-12-10 ENCOUNTER — Ambulatory Visit: Payer: Managed Care, Other (non HMO)

## 2014-12-10 ENCOUNTER — Ambulatory Visit
Admission: RE | Admit: 2014-12-10 | Discharge: 2014-12-10 | Disposition: A | Discharge status: Home / Self Care | Payer: Managed Care, Other (non HMO) | Source: Ambulatory Visit | Attending: Radiation Oncology | Admitting: Radiation Oncology

## 2014-12-10 DIAGNOSIS — C50412 Malignant neoplasm of upper-outer quadrant of left female breast: Secondary | ICD-10-CM | POA: Insufficient documentation

## 2014-12-10 HISTORY — DX: Malignant neoplasm of unspecified site of unspecified female breast: C50.919

## 2014-12-15 ENCOUNTER — Encounter: Payer: Self-pay | Admitting: Radiation Oncology

## 2014-12-15 ENCOUNTER — Ambulatory Visit
Admission: RE | Admit: 2014-12-15 | Discharge: 2014-12-15 | Disposition: A | Discharge status: Home / Self Care | Payer: Managed Care, Other (non HMO) | Source: Ambulatory Visit | Attending: Radiation Oncology | Admitting: Radiation Oncology

## 2014-12-15 VITALS — BP 123/71 | HR 78 | Temp 98.2°F | Resp 20 | Ht 59.0 in | Wt 189.1 lb

## 2014-12-15 DIAGNOSIS — C50412 Malignant neoplasm of upper-outer quadrant of left female breast: Secondary | ICD-10-CM

## 2014-12-15 DIAGNOSIS — C50212 Malignant neoplasm of upper-inner quadrant of left female breast: Secondary | ICD-10-CM

## 2014-12-15 NOTE — Progress Notes (Signed)
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 Lymph node, biopsy, Left axilla- ONE LYMPH NODE, NEGATIVE FOR TUMOR (0/1). 2. Lymph node, sentinel, biopsy, Left axilla- ONE LYMPH NODE, NEGATIVE FOR TUMOR (0/1). 3. Breast, Lumpectomy, Left - INVASIVE DUCTAL CARCINOMA, SEE COMMENT.- DUCTAL CARCINOMA IN SITU.- IN SITU CARCINOMA IS 1 CM FROM NEAREST MARGIN (MEDIAL).- PREVIOUS BIOPSY SITES (X2).- SEE TUMOR SYNOPTIC TEMPLATE BELOW. 4. Breast, Mammoplasty, left - BENIGN BREAST TISSUE, SEE COMMENT.- NEGATIVE FOR ATYPIA OR MALIGNANCY. 5. Breast, Mammoplasty, right - DUCTAL CARCINOMA IN SITU WITH NECROSIS AND CALCIFICATIONS, SEE COMMENT.Dr. Fanny Skates, MD  Narrative:  The patient returns today for routine follow-up.  The patient was originally seen in multidisciplinary clinic and was felt to be a good candidate for breast conservation treatment. She has completed lumpectomy and now presents for further discussion and coordination of an anticipated course of radiation treatment. The patient has seen medical oncology. She will not receive chemotherapy. The patient will be receiving anti-hormonal treatment after radiation treatment.  Will see Dr.  Dalbert Batman next Monday 12/22/14, Dr. Harlow Mares in 4 weeks Past/Anticipated interventions by medical oncology, if any: Chemotherapy : Seen breast clinic 10/01/2014 Dr. Jana Hakim, Oncotype result= 11 last seen 12/03/14   Lymphedema issues, if any: none Pain issues, if any: left axilla discomfort and soreness. The pt has no other complaints at this time.  ALLERGIES:  is allergic to lanolin and tape.  Meds: Current Outpatient Prescriptions  Medication Sig Dispense Refill  . acetaminophen (TYLENOL) 500 MG tablet Take 500 mg by mouth every 6 (six) hours as needed.    Marland Kitchen aMILoride (MIDAMOR) 5 MG tablet Take 5 mg by mouth daily.    . Cholecalciferol (VITAMIN D3) 2000 UNITS TABS Take 1 tablet by mouth daily.    . hydrochlorothiazide (MICROZIDE) 12.5 MG capsule Take 12.5 mg by mouth daily.    Marland Kitchen ketoconazole (NIZORAL) 2 % shampoo Apply 1 application topically 2 (two) times a week.    . metoprolol succinate (TOPROL-XL) 50 MG 24 hr tablet Take 50 mg by mouth daily. Take with or immediately following a meal.    . pantoprazole (PROTONIX) 20 MG tablet Take 20 mg by mouth daily as needed for heartburn.     . simvastatin (ZOCOR) 20 MG tablet Take 20 mg by mouth daily.    . Telmisartan-Amlodipine 80-5 MG TABS Take 1 tablet by mouth daily.  3   No current facility-administered medications for this encounter.    Physical Findings: The patient is in no acute distress. Patient is alert and oriented.  height is _0  (1.499 m) and weight is 189 lb 1.6 oz (85.775 kg). Her oral temperature is 98.2 F (36.8 C). Her blood pressure is 123/71 and her pulse is 78. Her respiration is 20. .   Surgical incision healing well bilaterally.  Lab Findings: Lab Results  Component Value Date   WBC 3.7* 12/03/2014   HGB 12.6 12/03/2014   HCT 38.3 12/03/2014   MCV 95.6 12/03/2014   PLT 215 12/03/2014     Radiographic Findings: No results found.  Impression: The patient has completed surgery. She is suitable to proceed with  adjuvant radiotherapy at this time.  I once again discussed the rationale for radiation treatment in this setting. We discussed the potential benefit of radiation treatment in terms of local/regional control. We also discussed the possible side effects and risks of a possible course of radiation treatment. All of her questions were answered.  The patient does wish to proceed with radiation treatment to the left breast and leaning towards not getting radiation treatment to the right breast; will notify us of her decision during CT simulation.  Plan: The patient will be scheduled for a simulation in the near future such that we can proceed with radiation treatment planning. I anticipate treating the patient for approximately 4 weeks to the left breast using tangent fields. The pt will be away after July 7, therefore it is appropriate to complete the treatment before then.  The patient was seen today for 30 minutes, with the majority of the time spent counseling the patient on his diagnosis of cancer and coordinating his care.    This document serves as a record of services personally performed by Kyung Rudd, MD. It was created on his behalf by Darcus Austin, a trained medical scribe. The creation of this record is based on the scribe's personal observations and the provider's statements to them. This document has been checked and approved by the attending provider.      Jodelle Gross, M.D., Ph.D.

## 2014-12-15 NOTE — Progress Notes (Signed)
Please see the Nurse Progress Note in the MD Initial Consult Encounter for this patient. 

## 2014-12-16 ENCOUNTER — Encounter: Payer: Self-pay | Admitting: Radiation Oncology

## 2014-12-22 ENCOUNTER — Ambulatory Visit
Admission: RE | Admit: 2014-12-22 | Discharge: 2014-12-22 | Disposition: A | Discharge status: Home / Self Care | Payer: Managed Care, Other (non HMO) | Source: Ambulatory Visit | Attending: Radiation Oncology | Admitting: Radiation Oncology

## 2014-12-22 DIAGNOSIS — C50412 Malignant neoplasm of upper-outer quadrant of left female breast: Secondary | ICD-10-CM

## 2014-12-28 DIAGNOSIS — C50412 Malignant neoplasm of upper-outer quadrant of left female breast: Secondary | ICD-10-CM | POA: Diagnosis not present

## 2014-12-29 ENCOUNTER — Encounter: Payer: Self-pay | Admitting: Primary Care

## 2014-12-29 ENCOUNTER — Ambulatory Visit
Admission: RE | Admit: 2014-12-29 | Discharge: 2014-12-29 | Disposition: A | Discharge status: Home / Self Care | Payer: Managed Care, Other (non HMO) | Source: Ambulatory Visit | Attending: Radiation Oncology | Admitting: Radiation Oncology

## 2014-12-29 ENCOUNTER — Ambulatory Visit (INDEPENDENT_AMBULATORY_CARE_PROVIDER_SITE_OTHER): Payer: Managed Care, Other (non HMO) | Admitting: Primary Care

## 2014-12-29 VITALS — BP 116/64 | HR 57 | Temp 98.3°F | Ht 59.0 in | Wt 191.8 lb

## 2014-12-29 DIAGNOSIS — C50412 Malignant neoplasm of upper-outer quadrant of left female breast: Secondary | ICD-10-CM | POA: Diagnosis not present

## 2014-12-29 DIAGNOSIS — L309 Dermatitis, unspecified: Secondary | ICD-10-CM | POA: Insufficient documentation

## 2014-12-29 NOTE — Progress Notes (Signed)
Pre visit review using our clinic review tool, if applicable. No additional management support is needed unless otherwise documented below in the visit note. 

## 2014-12-29 NOTE — Assessment & Plan Note (Signed)
Longstanding history. Present currently to left eye lid and left forehead. Continue with moisturizer to forehead and may also use on eye lid. If no improvement then may apply Hydrocortisone 1% to both. Follow up if no improvement.

## 2014-12-29 NOTE — Progress Notes (Signed)
Subjective:    Patient ID: Laura Bush, female    DOB: July 06, 1951, 64 y.o.   MRN: [id number]  HPI  [name] is a 64 year old female who presents today with a chief complaint of eczema to her left eye lid.  She has a history of eczema which has recently been present to her anterior/lateral left forehead since March this year. The eczema to her forehead has improved with the application of Merle Norman moisturizer cream. The eczema present to her left eye lid has been present for the past 2 weeks with some cracking to her skin this morning. She denies changes in vision and itching. Reports stinging upon palpation or washing with water. She stopped wearing eye make-up 2 weeks ago when she first noted symptoms, but that has not helped to dissipate her symptoms. Zostivax vaccination completed in 2014 in [place]. She has a history of allergies and has recently switched to Claritin from [place] which has helped. She's not applied anything to her eyelid for treatment.  Review of Systems  Constitutional: Negative for fever and chills.  Eyes: Negative for pain, discharge, redness, itching and visual disturbance.  Skin: Positive for rash.       Rash to left eyelid and left side of forehead.   Neurological: Negative for dizziness and headaches.       Past Medical History  Diagnosis Date  . Hypertension   . Venous insufficiency   . Asthma     in cold weather  . GERD (gastroesophageal reflux disease)   . Microscopic colitis   . Rosacea   . PONV (postoperative nausea and vomiting)   . High cholesterol   . Heart murmur     as a child (has outgrown)  . Pneumonia 2000's X 1  . OSA on CPAP   . Cluster headaches     in her 40's  . Breast cancer of upper-outer quadrant of left female breast 09/22/2014  . 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 Breast cancer 09/18/14    left breast  . Breast cancer 10/07/14    bx left breast    History   Social History  . Marital Status: Married   Spouse Name: N/A  . Number of Children: N/A  . Years of Education: N/A   Occupational History  . Not on file.   Social History Main Topics  . Smoking status: Never Smoker   . Smokeless tobacco: Never Used  . Alcohol Use: 2.4 oz/week    4 Glasses of wine per week  . Drug Use: No  . Sexual Activity: Not Currently   Other Topics Concern  . Not on file   Social History Narrative   From [place].   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   2 children, boy and girl   Twin grandchildren on the way.   Quilting, sewing, reading.    Past Surgical History  Procedure Laterality Date  . Cesarean section  1978; 1980  . Tubal ligation  ?1984  . Abdominal hysterectomy  1999  . Knee arthroscopy Bilateral     meniscus repair  . Colonoscopy    . Carpal tunnel release Bilateral     2 surgeries on right, 1 on left  . Forearm fracture surgery Right 2003 X 3  . Partial mastectomy with axillary sentinel lymph node biopsy Left 11/11/2014  . Mastectomy complete / simple w/ sentinel node biopsy Left 11/11/2014  . Tonsillectomy  ~  1957  . Appendectomy  ~ 2006  . Fracture surgery    . Reduction mammaplasty Bilateral 11/11/2014  . Breast biopsy Left 10/2014; 10/2014  . Squamous cell carcinoma excision  1980's X 1    "nose"  . Partial mastectomy with needle localization and axillary sentinel lymph node bx Left 11/11/2014    Procedure: LEFT BREAST PARTIAL MASTECTOMY WITH NEEDLE LOCALIZATION TIMES TWO AND LEFT AXILLARY SENTINEL LYMPH NODE Biopsy;  Surgeon: Fanny Skates, MD;  Location: [facility];  Service: General;  Laterality: Left;  . Breast reduction surgery Bilateral 11/11/2014    Procedure: Bilateral Breast Reduction;  Surgeon: Crissie Reese, MD;  Location: [facility];  Service: Plastics;  Laterality: Bilateral;    Family History  Problem Relation Age of Onset  . Uterine cancer Sister 41  . Stroke Father     deceased  . Dementia Mother     Lives in [place]     Allergies  Allergen Reactions  . Lanolin   . Tape Other (See Comments)    Redness (Bandaids also)    Current Outpatient Prescriptions on File Prior to Visit  Medication Sig Dispense Refill  . acetaminophen (TYLENOL) 500 MG tablet Take 500 mg by mouth every 6 (six) hours as needed.    Marland Kitchen aMILoride (MIDAMOR) 5 MG tablet Take 5 mg by mouth daily.    . Cholecalciferol (VITAMIN D3) 2000 UNITS TABS Take 1 tablet by mouth daily.    . hydrochlorothiazide (MICROZIDE) 12.5 MG capsule Take 12.5 mg by mouth daily.    Marland Kitchen ketoconazole (NIZORAL) 2 % shampoo Apply 1 application topically 2 (two) times a week.    . metoprolol succinate (TOPROL-XL) 50 MG 24 hr tablet Take 50 mg by mouth daily. Take with or immediately following a meal.    . pantoprazole (PROTONIX) 20 MG tablet Take 20 mg by mouth daily as needed for heartburn.     . simvastatin (ZOCOR) 20 MG tablet Take 20 mg by mouth daily.    . Telmisartan-Amlodipine 80-5 MG TABS Take 1 tablet by mouth daily.  3   No current facility-administered medications on file prior to visit.    BP 116/64 mmHg  Pulse 57  Temp(Src) 98.3 F (36.8 C) (Oral)  Ht '4\' 11"'$  (1.499 m)  Wt 191 lb 12.5 oz (86.991 kg)  BMI 38.71 kg/m2  SpO2 96%    Objective:   Physical Exam  Constitutional: She appears well-nourished.  Cardiovascular: Normal rate and regular rhythm.   Pulmonary/Chest: Effort normal and breath sounds normal.  Skin: Skin is warm and dry.  Mild rash, representing eczema, present to left crease of eyelid with mild cracking to skin crease. No drainage. No bleeding.          Assessment & Plan:

## 2014-12-29 NOTE — Patient Instructions (Signed)
You may use a mild moisturizer to the eyelid such as Dove or Dial. If this does not help you may use Hydrocordisone 1% cream which may be purchased over the counter.  Wash your hands immediatly after application.  Call/message me if no improvement. It was so nice seeing you today!

## 2014-12-30 ENCOUNTER — Ambulatory Visit
Admission: RE | Admit: 2014-12-30 | Discharge: 2014-12-30 | Disposition: A | Discharge status: Home / Self Care | Payer: Managed Care, Other (non HMO) | Source: Ambulatory Visit | Attending: Radiation Oncology | Admitting: Radiation Oncology

## 2014-12-30 DIAGNOSIS — C50412 Malignant neoplasm of upper-outer quadrant of left female breast: Secondary | ICD-10-CM | POA: Diagnosis not present

## 2014-12-31 ENCOUNTER — Ambulatory Visit
Admission: RE | Admit: 2014-12-31 | Discharge: 2014-12-31 | Disposition: A | Discharge status: Home / Self Care | Payer: Managed Care, Other (non HMO) | Source: Ambulatory Visit | Attending: Radiation Oncology | Admitting: Radiation Oncology

## 2014-12-31 DIAGNOSIS — C50412 Malignant neoplasm of upper-outer quadrant of left female breast: Secondary | ICD-10-CM | POA: Diagnosis not present

## 2015-01-01 ENCOUNTER — Telehealth: Payer: Self-pay | Admitting: *Deleted

## 2015-01-01 ENCOUNTER — Ambulatory Visit
Admission: RE | Admit: 2015-01-01 | Discharge: 2015-01-01 | Disposition: A | Discharge status: Home / Self Care | Payer: Managed Care, Other (non HMO) | Source: Ambulatory Visit | Attending: Radiation Oncology | Admitting: Radiation Oncology

## 2015-01-01 DIAGNOSIS — C50412 Malignant neoplasm of upper-outer quadrant of left female breast: Secondary | ICD-10-CM | POA: Diagnosis not present

## 2015-01-01 MED ORDER — RADIAPLEXRX EX GEL
Freq: Once | CUTANEOUS | Status: AC
  Administered 2015-01-01: 15:00:00 via TOPICAL

## 2015-01-01 MED ORDER — ALRA NON-METALLIC DEODORANT (RAD-ONC)
1.0000 "application " | Freq: Once | TOPICAL | Status: AC
  Administered 2015-01-01: 1 via TOPICAL

## 2015-01-01 NOTE — Telephone Encounter (Signed)
Called pt to assess needs during xrt. Relate she is doing well. Denies needs at this time. Discussed FYNN and enrollment. Encourage pt to call with questions or concerns. Received verbal understanding.

## 2015-01-01 NOTE — Progress Notes (Signed)
Pt education, my business card, radiation therapy and you book, alra, radiaplex gel given topatient, discussed ways to manage side effects/symptoms of skin irritation, swelling, tenderness in breast,fatigue,pain, use skin producats after rad txs and vbedtime daily, increase protein in diet, stay hydrated, drink plenty water, teach back given 3:17 PM

## 2015-01-02 ENCOUNTER — Ambulatory Visit
Admission: RE | Admit: 2015-01-02 | Discharge: 2015-01-02 | Disposition: A | Discharge status: Home / Self Care | Payer: Managed Care, Other (non HMO) | Source: Ambulatory Visit | Attending: Radiation Oncology | Admitting: Radiation Oncology

## 2015-01-02 ENCOUNTER — Ambulatory Visit
Admission: RE | Admit: 2015-01-02 | Discharge: 2015-01-02 | Disposition: A | Discharge status: Home / Self Care | Payer: 59 | Source: Ambulatory Visit | Attending: Radiation Oncology | Admitting: Radiation Oncology

## 2015-01-02 ENCOUNTER — Encounter: Payer: Self-pay | Admitting: Radiation Oncology

## 2015-01-02 VITALS — BP 128/65 | HR 61 | Temp 97.6°F | Resp 20 | Wt 194.1 lb

## 2015-01-02 DIAGNOSIS — C50412 Malignant neoplasm of upper-outer quadrant of left female breast: Secondary | ICD-10-CM | POA: Diagnosis not present

## 2015-01-02 NOTE — Progress Notes (Signed)
Weekly rad txsleft breast 4 completed, pt education done yesterday, will start using radiaplex bid, achi ness in left breast at tuimes, pink tinge near nipple area, appetie good 8:45 AM BP 128/65 mmHg  Pulse 61  Temp(Src) 97.6 F (36.4 C) (Oral)  Resp 20  Wt 194 lb 1.6 oz (88.043 kg)  Wt Readings from Last 3 Encounters:  12/29/14 191 lb 12.5 oz (86.991 kg)  12/03/14 189 lb 8 oz (85.957 kg)  11/11/14 196 lb (88.905 kg)

## 2015-01-02 NOTE — Progress Notes (Signed)
   Department of Radiation Oncology  Phone:  [phone] Fax:        [phone]  Weekly Treatment Note    Name: Laura Bush Date: 01/02/2015 MRN: [id number] DOB: Nov 02, 1950   Current dose: 10 Gy  Current fraction: 4   MEDICATIONS: Current Outpatient Prescriptions  Medication Sig Dispense Refill  . acetaminophen (TYLENOL) 500 MG tablet Take 500 mg by mouth every 6 (six) hours as needed.    Marland Kitchen aMILoride (MIDAMOR) 5 MG tablet Take 5 mg by mouth daily.    . Cholecalciferol (VITAMIN D3) 2000 UNITS TABS Take 1 tablet by mouth daily.    . hyaluronate sodium (RADIAPLEXRX) GEL Apply 1 application topically 2 (two) times daily.    . hydrochlorothiazide (MICROZIDE) 12.5 MG capsule Take 12.5 mg by mouth daily.    Marland Kitchen ketoconazole (NIZORAL) 2 % shampoo Apply 1 application topically 2 (two) times a week.    . metoprolol succinate (TOPROL-XL) 50 MG 24 hr tablet Take 50 mg by mouth daily. Take with or immediately following a meal.    . non-metallic deodorant (ALRA) MISC Apply 1 application topically daily as needed.    . pantoprazole (PROTONIX) 20 MG tablet Take 20 mg by mouth daily as needed for heartburn.     . simvastatin (ZOCOR) 20 MG tablet Take 20 mg by mouth daily.    . Telmisartan-Amlodipine 80-5 MG TABS Take 1 tablet by mouth daily.  3   No current facility-administered medications for this encounter.     ALLERGIES: Lanolin and Tape   LABORATORY DATA:  Lab Results  Component Value Date   WBC 3.7* 12/03/2014   HGB 12.6 12/03/2014   HCT 38.3 12/03/2014   MCV 95.6 12/03/2014   PLT 215 12/03/2014   Lab Results  Component Value Date   NA 141 12/03/2014   K 3.8 12/03/2014   CL 102 11/05/2014   CO2 24 12/03/2014   Lab Results  Component Value Date   ALT 18 12/03/2014   AST 14 12/03/2014   ALKPHOS 57 12/03/2014   BILITOT 0.51 12/03/2014     NARRATIVE: Laura Bush was seen today for weekly treatment management. The chart was checked and the patient's films were  reviewed.  Weekly rad txsleft breast 4 completed, pt education done yesterday, will start using radiaplex bid, achi ness in left breast at tuimes, pink tinge near nipple area, appetie good 9:58 AM BP 128/65 mmHg  Pulse 61  Temp(Src) 97.6 F (36.4 C) (Oral)  Resp 20  Wt 194 lb 1.6 oz (88.043 kg)  Wt Readings from Last 3 Encounters:  12/29/14 191 lb 12.5 oz (86.991 kg)  12/03/14 189 lb 8 oz (85.957 kg)  11/11/14 196 lb (88.905 kg)    PHYSICAL EXAMINATION: weight is 194 lb 1.6 oz (88.043 kg). Her oral temperature is 97.6 F (36.4 C). Her blood pressure is 128/65 and her pulse is 61. Her respiration is 20.        ASSESSMENT: The patient is doing satisfactorily with treatment.  PLAN: We will continue with the patient's radiation treatment as planned.

## 2015-01-06 ENCOUNTER — Ambulatory Visit
Admission: RE | Admit: 2015-01-06 | Discharge: 2015-01-06 | Disposition: A | Discharge status: Home / Self Care | Payer: Managed Care, Other (non HMO) | Source: Ambulatory Visit | Attending: Radiation Oncology | Admitting: Radiation Oncology

## 2015-01-06 DIAGNOSIS — C50412 Malignant neoplasm of upper-outer quadrant of left female breast: Secondary | ICD-10-CM | POA: Diagnosis not present

## 2015-01-07 ENCOUNTER — Ambulatory Visit
Admission: RE | Admit: 2015-01-07 | Discharge: 2015-01-07 | Disposition: A | Discharge status: Home / Self Care | Payer: Managed Care, Other (non HMO) | Source: Ambulatory Visit | Attending: Radiation Oncology | Admitting: Radiation Oncology

## 2015-01-07 DIAGNOSIS — C50412 Malignant neoplasm of upper-outer quadrant of left female breast: Secondary | ICD-10-CM | POA: Diagnosis not present

## 2015-01-08 ENCOUNTER — Ambulatory Visit
Admission: RE | Admit: 2015-01-08 | Discharge: 2015-01-08 | Disposition: A | Discharge status: Home / Self Care | Payer: Managed Care, Other (non HMO) | Source: Ambulatory Visit | Attending: Radiation Oncology | Admitting: Radiation Oncology

## 2015-01-08 DIAGNOSIS — C50412 Malignant neoplasm of upper-outer quadrant of left female breast: Secondary | ICD-10-CM | POA: Diagnosis not present

## 2015-01-09 ENCOUNTER — Ambulatory Visit
Admission: RE | Admit: 2015-01-09 | Discharge: 2015-01-09 | Disposition: A | Discharge status: Home / Self Care | Payer: Managed Care, Other (non HMO) | Source: Ambulatory Visit | Attending: Radiation Oncology | Admitting: Radiation Oncology

## 2015-01-09 ENCOUNTER — Encounter: Payer: Self-pay | Admitting: Radiation Oncology

## 2015-01-09 VITALS — BP 127/73 | HR 52 | Temp 97.9°F | Resp 16 | Wt 194.1 lb

## 2015-01-09 DIAGNOSIS — C50412 Malignant neoplasm of upper-outer quadrant of left female breast: Secondary | ICD-10-CM | POA: Diagnosis not present

## 2015-01-09 NOTE — Progress Notes (Signed)
Weekly rad txs, left breast  8/20 completed,  Slight pink on left breast,skin intact, uses radiaplex bid, occasional twinges in breast, numbness under axill a still, no pain, appetite good, slight fatigue 9:56 AM .BP 127/73 mmHg  Pulse 52  Temp(Src) 97.9 F (36.6 C) (Oral)  Resp 16  Wt 194 lb 1.6 oz (88.043 kg)  Wt Readings from Last 3 Encounters:  12/29/14 191 lb 12.5 oz (86.991 kg)  12/03/14 189 lb 8 oz (85.957 kg)  11/11/14 196 lb (88.905 kg)

## 2015-01-09 NOTE — Progress Notes (Signed)
   Department of Radiation Oncology  Phone:  [phone] Fax:        [phone]  Weekly Treatment Note    Name: Laura Bush Date: 01/09/2015 MRN: [id number] DOB: [birth year]   Current dose: 20 Gy  Current fraction: 8   MEDICATIONS: Current Outpatient Prescriptions  Medication Sig Dispense Refill  . acetaminophen (TYLENOL) 500 MG tablet Take 500 mg by mouth every 6 (six) hours as needed.    Marland Kitchen aMILoride (MIDAMOR) 5 MG tablet Take 5 mg by mouth daily.    . B Complex-C (B-COMPLEX WITH VITAMIN C) tablet Take 1 tablet by mouth daily.    . Cholecalciferol (VITAMIN D3) 2000 UNITS TABS Take 1 tablet by mouth daily.    . hyaluronate sodium (RADIAPLEXRX) GEL Apply 1 application topically 2 (two) times daily.    . hydrochlorothiazide (MICROZIDE) 12.5 MG capsule Take 12.5 mg by mouth daily.    Marland Kitchen ketoconazole (NIZORAL) 2 % shampoo Apply 1 application topically 2 (two) times a week.    . metoprolol succinate (TOPROL-XL) 50 MG 24 hr tablet Take 50 mg by mouth daily. Take with or immediately following a meal.    . non-metallic deodorant (ALRA) MISC Apply 1 application topically daily as needed.    . pantoprazole (PROTONIX) 20 MG tablet Take 20 mg by mouth daily as needed for heartburn.     . simvastatin (ZOCOR) 20 MG tablet Take 20 mg by mouth daily.    . Telmisartan-Amlodipine 80-5 MG TABS Take 1 tablet by mouth daily.  3   No current facility-administered medications for this encounter.     ALLERGIES: Lanolin and Tape   LABORATORY DATA:  Lab Results  Component Value Date   WBC 3.7* 12/03/2014   HGB 12.6 12/03/2014   HCT 38.3 12/03/2014   MCV 95.6 12/03/2014   PLT 215 12/03/2014   Lab Results  Component Value Date   NA 141 12/03/2014   K 3.8 12/03/2014   CL 102 11/05/2014   CO2 24 12/03/2014   Lab Results  Component Value Date   ALT 18 12/03/2014   AST 14 12/03/2014   ALKPHOS 57 12/03/2014   BILITOT 0.51 12/03/2014     NARRATIVE: Lorenna W Bush was seen today  for weekly treatment management. The chart was checked and the patient's films were reviewed.  Weekly rad txs, left breast  8/20 completed,  Slight pink on left breast,skin intact, uses radiaplex bid, occasional twinges in breast, numbness under axill a still, no pain, appetite good, slight fatigue 10:11 AM .BP 127/73 mmHg  Pulse 52  Temp(Src) 97.9 F (36.6 C) (Oral)  Resp 16  Wt 194 lb 1.6 oz (88.043 kg)  Wt Readings from Last 3 Encounters:  12/29/14 191 lb 12.5 oz (86.991 kg)  12/03/14 189 lb 8 oz (85.957 kg)  11/11/14 196 lb (88.905 kg)    PHYSICAL EXAMINATION: weight is 194 lb 1.6 oz (88.043 kg). Her oral temperature is 97.9 F (36.6 C). Her blood pressure is 127/73 and her pulse is 52. Her respiration is 16.      the patient's skin shows some mild erythema.  ASSESSMENT: The patient is doing satisfactorily with treatment.  PLAN: We will continue with the patient's radiation treatment as planned.

## 2015-01-12 ENCOUNTER — Ambulatory Visit
Admission: RE | Admit: 2015-01-12 | Discharge: 2015-01-12 | Disposition: A | Discharge status: Home / Self Care | Payer: Managed Care, Other (non HMO) | Source: Ambulatory Visit | Attending: Radiation Oncology | Admitting: Radiation Oncology

## 2015-01-12 DIAGNOSIS — C50412 Malignant neoplasm of upper-outer quadrant of left female breast: Secondary | ICD-10-CM | POA: Diagnosis not present

## 2015-01-13 ENCOUNTER — Ambulatory Visit
Admission: RE | Admit: 2015-01-13 | Discharge: 2015-01-13 | Disposition: A | Discharge status: Home / Self Care | Payer: Managed Care, Other (non HMO) | Source: Ambulatory Visit | Attending: Radiation Oncology | Admitting: Radiation Oncology

## 2015-01-13 DIAGNOSIS — C50412 Malignant neoplasm of upper-outer quadrant of left female breast: Secondary | ICD-10-CM | POA: Diagnosis not present

## 2015-01-14 ENCOUNTER — Ambulatory Visit
Admission: RE | Admit: 2015-01-14 | Discharge: 2015-01-14 | Disposition: A | Discharge status: Home / Self Care | Payer: Managed Care, Other (non HMO) | Source: Ambulatory Visit | Attending: Radiation Oncology | Admitting: Radiation Oncology

## 2015-01-14 DIAGNOSIS — C50412 Malignant neoplasm of upper-outer quadrant of left female breast: Secondary | ICD-10-CM | POA: Diagnosis not present

## 2015-01-15 ENCOUNTER — Ambulatory Visit
Admission: RE | Admit: 2015-01-15 | Discharge: 2015-01-15 | Disposition: A | Discharge status: Home / Self Care | Payer: Managed Care, Other (non HMO) | Source: Ambulatory Visit | Attending: Radiation Oncology | Admitting: Radiation Oncology

## 2015-01-15 ENCOUNTER — Encounter: Payer: Self-pay | Admitting: Radiation Oncology

## 2015-01-15 VITALS — BP 128/72 | HR 53 | Temp 98.0°F | Resp 20 | Wt 194.3 lb

## 2015-01-15 DIAGNOSIS — C50412 Malignant neoplasm of upper-outer quadrant of left female breast: Secondary | ICD-10-CM | POA: Diagnosis not present

## 2015-01-15 NOTE — Progress Notes (Signed)
  Radiation Oncology         [phone]   Name: Laura Bush MRN: [id number]   Date: 01/15/2015  DOB: 1951/02/27   Weekly Radiation Therapy Management    ICD-9-CM ICD-10-CM   1. Breast cancer of upper-outer quadrant of left female breast 174.4 C50.412     Current Dose: 30 Gy  Planned Dose:  50 Gy  Narrative The patient presents for routine under treatment assessment. Weekly rad tx left breast 12/20 completed, pink erythema on breast, twinges stated occasionally , skin intact, using radiaplex bid, appetite good, no fatigue.  The patient is without complaint.  Set-up films were reviewed.  The chart was checked.  Physical Findings BP 128/72 mmHg  Pulse 53  Temp(Src) 98 F (36.7 C) (Oral)  Resp 20  Wt 194 lb 4.8 oz (88.134 kg) . Weight essentially stable.  No significant changes.  Impression The patient is tolerating radiation.  Plan Continue treatment as planned.    This document serves as a record of services personally performed by Tyler Pita, MD. It was created on his behalf by Jeralene Peters, a trained medical scribe. The creation of this record is based on the scribe's personal observations and 2the provider's statements to them. This document has been checked and approved by the attending provider.        Sheral Apley Tammi Klippel, M.D.

## 2015-01-15 NOTE — Progress Notes (Signed)
Weekly rad tx left breast 12/20 completed, pink erythema on breast, twinges stated occasionally , skin intact,   , using radiaplex bid, appetite good, no fatigue BP 128/72 mmHg  Pulse 53  Temp(Src) 98 F (36.7 C) (Oral)  Resp 20  Wt 194 lb 4.8 oz (88.134 kg)  Wt Readings from Last 3 Encounters:  01/15/15 194 lb 4.8 oz (88.134 kg)  01/09/15 194 lb 1.6 oz (88.043 kg)  01/02/15 194 lb 1.6 oz (88.043 kg)

## 2015-01-16 ENCOUNTER — Ambulatory Visit
Admission: RE | Admit: 2015-01-16 | Discharge: 2015-01-16 | Disposition: A | Discharge status: Home / Self Care | Payer: Managed Care, Other (non HMO) | Source: Ambulatory Visit | Attending: Radiation Oncology | Admitting: Radiation Oncology

## 2015-01-16 DIAGNOSIS — C50412 Malignant neoplasm of upper-outer quadrant of left female breast: Secondary | ICD-10-CM | POA: Diagnosis not present

## 2015-01-18 NOTE — Progress Notes (Signed)
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 While still there, she had routine screening mammography with tomography 02/14/2014, which showed only a tiny breast cysts. Six-month follow-up exam was performed 08/25/2014, and now there was evidence of a possible focal asymmetry in the left breast. On 08/25/2014 at the [facility] of radiology she underwent a unilateral diagnostic mammography with tomography , and this confirmed a spiculated mass in the left breast superiorly. Sonography of this area found a poorly defined hypoechoic mass measuring 0.7 cm at the 11:00 position 8 cm from the nipple. There was also an adjacent cluster of cysts with a benign circumcised appearance.   Biopsy of the mass in question was recommended, but as the patient was moving to this area, this was performed here on 09/18/2014. The pathology from that procedure (SAA [id number]) found an invasive ductal carcinoma, grade 2, estrogen receptor 100% positive, with strong staining intensity, progesterone receptor 43% positive, with  strong staining intensity, with an MIB-1 of 30%, and no HER-2 amplification.    on 09/26/2014 the patient underwent bilateral breast MRI at [facility], showing a breast density category B. The right breast was unremarkable and there were no lymph nodes of concern. In the upper inner quadrant of the left breast there was an enhancing mass measuring 1.2 cm. In addition there was a 5.5 cm area of linear enhancement extending anteriorly from this mass. 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   The patient's subsequent history is as detailed below  INTERVAL HISTORY:  Laura Bush returns today for follow-up of her breast cancers. After her last visit with me we obtained a prognostic panel on the right breast ductal carcinoma in situ and this was estrogen and progesterone receptor positive (as was her left sided invasive breast cancer). This is favorable, since it means when we treat the left breast cancer with anti-estrogens will be treating the noninvasive right sided breast cancer as well. Since her last visit here she also has started radiation, which she will complete next week.  REVIEW OF SYSTEMS: Laura Bush is tolerating the radiation well. She has mild fatigue, which means some afternoon she takes a little up. She still walks 30-40 minutes every morning. She is having minimal redness over the left breast. Of course she is not receiving radiation to the right breast.--On a review of menopausal symptoms, she is having significant issues with vaginal dryness. She did have hot flashes, mood changes, night sweats and insomnia when she went through menopause earlier, but she is not having those symptoms at present. A detailed review of systems today was otherwise benign  PAST MEDICAL HISTORY: Past Medical History  Diagnosis Date  . Hypertension   .  Venous insufficiency   . Asthma     in cold weather  . GERD (gastroesophageal reflux disease)   . Microscopic colitis   . Rosacea   . PONV  (postoperative nausea and vomiting)   . High cholesterol   . Heart murmur     as a child (has outgrown)  . Pneumonia 2000's X 1  . OSA on CPAP   . Cluster headaches     in her 40's  . Breast cancer of upper-outer quadrant of left female breast 09/22/2014  . 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 Breast cancer 09/18/14    left breast  . Breast cancer 10/07/14    bx left breast    PAST SURGICAL HISTORY: Past Surgical History  Procedure Laterality Date  . Cesarean section  1978; 1980  . Tubal ligation  ?1984  . Abdominal hysterectomy  1999  . Knee arthroscopy Bilateral     meniscus repair  . Colonoscopy    . Carpal tunnel release Bilateral     2 surgeries on right, 1 on left  . Forearm fracture surgery Right 2003 X 3  . Partial mastectomy with axillary sentinel lymph node biopsy Left 11/11/2014  . Mastectomy complete / simple w/ sentinel node biopsy Left 11/11/2014  . Tonsillectomy  ~ 1957  . Appendectomy  ~ 2006  . Fracture surgery    . Reduction mammaplasty Bilateral 11/11/2014  . Breast biopsy Left 10/2014; 10/2014  . Squamous cell carcinoma excision  1980's X 1    "nose"  . Partial mastectomy with needle localization and axillary sentinel lymph node bx Left 11/11/2014    Procedure: LEFT BREAST PARTIAL MASTECTOMY WITH NEEDLE LOCALIZATION TIMES TWO AND LEFT AXILLARY SENTINEL LYMPH NODE Biopsy;  Surgeon: Fanny Skates, MD;  Location: [facility];  Service: General;  Laterality: Left;  . Breast reduction surgery Bilateral 11/11/2014    Procedure: Bilateral Breast Reduction;  Surgeon: Crissie Reese, MD;  Location: [place];  Service: Plastics;  Laterality: Bilateral;    FAMILY HISTORY Family History  Problem Relation Age of Onset  . Uterine cancer Sister 61  . Stroke Father     deceased  . Dementia Mother     Lives in [place]   the patient's father died at the age of 64 following a stroke. The patient's mother is still living, age 52. GEN had no brothers, one sister. That sister was diagnosed  with uterine cancer at the age of 79. There is no other history of breast or ovarian cancer in the family to the patient's knowledge  GYNECOLOGIC HISTORY:  No LMP recorded. Patient has had a hysterectomy.  menarche age 50, first live birth age 64, the patient is Wiley P2.  She underwent total abdominal hysterectomy with bilateral salpingo-oophorectomy in 1999. She took hormone replacement for approximately 4 years. 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 Their daughter Laura Bush lives in [place] where she works as an Web designer. Their son Laura Bush lives in [place] where he works as a Education officer, museum. Laura Bush is expecting twins.    ADVANCED DIRECTIVES:  Not in place. During the 12/03/2014 visited the patient was given the appropriate forms to complete and notarize at her discretion.   HEALTH MAINTENANCE: History  Substance Use Topics  . Smoking status: Never Smoker   . Smokeless tobacco: Never  Used  . Alcohol Use: 2.4 oz/week    4 Glasses of wine per week     Colonoscopy:  2008  PAP:  Bone density: 2007/"normal"  Lipid panel:  Allergies  Allergen Reactions  . Lanolin   . Tape Other (See Comments)    Redness (Bandaids also)    Current Outpatient Prescriptions  Medication Sig Dispense Refill  . acetaminophen (TYLENOL) 500 MG tablet Take 500 mg by mouth every 6 (six) hours as needed.    Marland Kitchen aMILoride (MIDAMOR) 5 MG tablet Take 5 mg by mouth daily.    . B Complex-C (B-COMPLEX WITH VITAMIN C) tablet Take 1 tablet by mouth daily.    . Cholecalciferol (VITAMIN D3) 2000 UNITS TABS Take 1 tablet by mouth daily.    . hyaluronate sodium (RADIAPLEXRX) GEL Apply 1 application topically 2 (two) times daily.    . hydrochlorothiazide (MICROZIDE) 12.5 MG capsule Take 12.5 mg by mouth daily.    Marland Kitchen ketoconazole  (NIZORAL) 2 % shampoo Apply 1 application topically 2 (two) times a week.    . metoprolol succinate (TOPROL-XL) 50 MG 24 hr tablet Take 50 mg by mouth daily. Take with or immediately following a meal.    . non-metallic deodorant (ALRA) MISC Apply 1 application topically daily as needed.    . pantoprazole (PROTONIX) 20 MG tablet Take 20 mg by mouth daily as needed for heartburn.     . simvastatin (ZOCOR) 20 MG tablet Take 20 mg by mouth daily.    . Telmisartan-Amlodipine 80-5 MG TABS Take 1 tablet by mouth daily.  3   No current facility-administered medications for this visit.    OBJECTIVE:  Middle-aged white woman in no acute distress Filed Vitals:   01/19/15 0854  BP: 126/97  Pulse: 70  Temp: 98.1 F (36.7 C)  Resp: 18     Body mass index is 38.94 kg/(m^2).    ECOG FS:1 - Symptomatic but completely ambulatory  Sclerae unicteric, pupils round and equal Oropharynx clear and moist-- no thrush or other lesions No cervical or supraclavicular adenopathy Lungs no rales or rhonchi Heart regular rate and rhythm Abd soft, nontender, positive bowel sounds MSK no focal spinal tenderness, no upper extremity lymphedema Neuro: nonfocal, well oriented, appropriate affect Breasts: The right breast is status post reduction mammoplasty. The incisions have healed very nicely. The left breast is status post lumpectomy and reduction mammoplasty. There is mild erythema over the radiation port area. There is no desquamation. Both axillae are benign.   LAB RESULTS:  CMP     Component Value Date/Time   NA 141 12/03/2014 0832   NA 139 11/05/2014 1142   K 3.8 12/03/2014 0832   K 3.7 11/05/2014 1142   CL 102 11/05/2014 1142   CO2 24 12/03/2014 0832   CO2 28 11/05/2014 1142   GLUCOSE 125 12/03/2014 0832   GLUCOSE 98 11/05/2014 1142   BUN 13.4 12/03/2014 0832   BUN 14 11/05/2014 1142   CREATININE 1.0 12/03/2014 0832   CREATININE 0.89 11/05/2014 1142   CALCIUM 9.1 12/03/2014 0832   CALCIUM 9.7  12/03/2014 0832   PROT 7.0 12/03/2014 0832   PROT 6.9 11/05/2014 1142   ALBUMIN 3.9 12/03/2014 0832   ALBUMIN 4.1 11/05/2014 1142   AST 14 12/03/2014 0832   AST 23 11/05/2014 1142   ALT 18 12/03/2014 0832   ALT 31 11/05/2014 1142   ALKPHOS 57 12/03/2014 0832   ALKPHOS 52 11/05/2014 1142   BILITOT 0.51 12/03/2014 0832   BILITOT  0.4 11/05/2014 1142   GFRNONAA 68* 11/05/2014 1142   GFRAA 78* 11/05/2014 1142    INo results found for: SPEP, UPEP  Lab Results  Component Value Date   WBC 3.7* 12/03/2014   NEUTROABS 2.0 12/03/2014   HGB 12.6 12/03/2014   HCT 38.3 12/03/2014   MCV 95.6 12/03/2014   PLT 215 12/03/2014      Chemistry      Component Value Date/Time   NA 141 12/03/2014 0832   NA 139 11/05/2014 1142   K 3.8 12/03/2014 0832   K 3.7 11/05/2014 1142   CL 102 11/05/2014 1142   CO2 24 12/03/2014 0832   CO2 28 11/05/2014 1142   BUN 13.4 12/03/2014 0832   BUN 14 11/05/2014 1142   CREATININE 1.0 12/03/2014 0832   CREATININE 0.89 11/05/2014 1142      Component Value Date/Time   CALCIUM 9.1 12/03/2014 0832   CALCIUM 9.7 12/03/2014 0832   ALKPHOS 57 12/03/2014 0832   ALKPHOS 52 11/05/2014 1142   AST 14 12/03/2014 0832   AST 23 11/05/2014 1142   ALT 18 12/03/2014 0832   ALT 31 11/05/2014 1142   BILITOT 0.51 12/03/2014 0832   BILITOT 0.4 11/05/2014 1142       No results found for: LABCA2  No components found for: LABCA125  No results for input(s): INR in the last 168 hours.  Urinalysis    Component Value Date/Time   COLORURINE YELLOW 11/05/2014 1142   APPEARANCEUR CLEAR 11/05/2014 1142   LABSPEC 1.008 11/05/2014 1142   PHURINE 7.0 11/05/2014 1142   GLUCOSEU NEGATIVE 11/05/2014 1142   HGBUR NEGATIVE 11/05/2014 1142   BILIRUBINUR NEGATIVE 11/05/2014 1142   KETONESUR NEGATIVE 11/05/2014 1142   PROTEINUR NEGATIVE 11/05/2014 1142   UROBILINOGEN 0.2 11/05/2014 1142   NITRITE NEGATIVE 11/05/2014 1142   LEUKOCYTESUR NEGATIVE 11/05/2014 1142     STUDIES: No results found.  ASSESSMENT: 64 y.o. [place], [place] woman status post left breast upper outer quadrant biopsy 09/18/2014 for a clinical T1 cN0 invasive ductal carcinoma, grade 2, estrogen and progesterone receptor positive, HER-2 not amplified, with an MIB-1 of 30%  (1) there is a 5.5 cm area of non-masslike enhancement seen in the left breast with biopsy 10/07/2014 showing atypical ductal hyperplasia  (2) status post left lumpectomy and sentinel lymph node sampling 11/11/2014 for a  pT1c  PN0, stage IA invasive ductal carcinoma, grade 3, repeat HER-2 again negative, with negative margins  (3) an Oncotype DX score of 11 predicts an 8% risk of outside the breast recurrence within 10 years if the patient's only systemic therapy is tamoxifen for 5 years. It also predicts no benefit from chemotherapy  (4). Pathology from right reduction mammoplasty 11/11/2014 unexpectedly showed ductal carcinoma in situ, estrogen receptor 100% positive, progesterone receptor 71% positive  (5) adjuvant radiation to be completed 01/27/2015  ( 6) the patient will benefit from antiestrogen's after the completion of her local treatment.  (7) because of the incomplete resection on the right breast, bilateral yearly breast MRI are recommended, in addition to mammography.  PLAN: Laura Bush will be completing her radiation treatments next week. She will be ready to start anti-estrogens mid-July. Today we spent approximately 40 minutes going over the difference between tamoxifen and anastrozole and she has a good understanding of the possible toxicities, side effects and complications of these agents--she received that information in writing as well as.  Currently the situation regarding these agents has been altered because of data showing that 10 years of  anastrozole is superior to 5 years of anastrozole. Since roughly speaking 5 years of anastrozole was equivalent to 10 years of tamoxifen (they have not been  compared head-to-head) the presumption is that 10 years of anastrozole may be the optimal treatment in this situation.  It turns out that she is very much in favor of 10 years of treatment in any case and we did discuss the pattern of relapse of estrogen receptor positive breast cancers.  We will therefore start anastrozole mid July. If she develops significant problems with night sweats that she did when she was going through menopause, we will add gabapentin. In general we will try to manage symptoms so that by the time she returns to see me in September we will know she is going to be able to tolerate this or not.  She already does have problems with vaginal dryness issues and I have referred her to the intimacy and pelvic health program here  Laura Bush has a good understanding of the overall plan. She agrees with it. She will call with any problems that may develop before her next visit here.   Marland KitchenChauncey Cruel, MD   01/19/2015 9:05 AM Medical Oncology and Hematology [facility] [address] [place], [place] [address] Tel. [phone]    Fax. [phone]

## 2015-01-19 ENCOUNTER — Ambulatory Visit (HOSPITAL_BASED_OUTPATIENT_CLINIC_OR_DEPARTMENT_OTHER): Payer: Managed Care, Other (non HMO) | Admitting: Oncology

## 2015-01-19 ENCOUNTER — Telehealth: Payer: Self-pay | Admitting: *Deleted

## 2015-01-19 ENCOUNTER — Encounter: Payer: Self-pay | Admitting: Primary Care

## 2015-01-19 ENCOUNTER — Ambulatory Visit
Admission: RE | Admit: 2015-01-19 | Discharge: 2015-01-19 | Disposition: A | Discharge status: Home / Self Care | Payer: Managed Care, Other (non HMO) | Source: Ambulatory Visit | Attending: Radiation Oncology | Admitting: Radiation Oncology

## 2015-01-19 ENCOUNTER — Other Ambulatory Visit: Payer: Self-pay | Admitting: *Deleted

## 2015-01-19 ENCOUNTER — Telehealth: Payer: Self-pay | Admitting: Oncology

## 2015-01-19 ENCOUNTER — Other Ambulatory Visit: Payer: Self-pay | Admitting: Primary Care

## 2015-01-19 VITALS — BP 126/97 | HR 70 | Temp 98.1°F | Resp 18 | Ht 59.0 in | Wt 192.9 lb

## 2015-01-19 DIAGNOSIS — I10 Essential (primary) hypertension: Secondary | ICD-10-CM

## 2015-01-19 DIAGNOSIS — C50412 Malignant neoplasm of upper-outer quadrant of left female breast: Secondary | ICD-10-CM | POA: Diagnosis not present

## 2015-01-19 DIAGNOSIS — Z853 Personal history of malignant neoplasm of breast: Secondary | ICD-10-CM | POA: Diagnosis not present

## 2015-01-19 DIAGNOSIS — Z79818 Long term (current) use of other agents affecting estrogen receptors and estrogen levels: Secondary | ICD-10-CM

## 2015-01-19 MED ORDER — HYDROCHLOROTHIAZIDE 12.5 MG PO CAPS: 12.5000 mg | ORAL_CAPSULE | Freq: Every day | ORAL | Status: DC

## 2015-01-19 MED ORDER — ANASTROZOLE 1 MG PO TABS: 1.0000 mg | ORAL_TABLET | Freq: Every day | ORAL | Status: DC

## 2015-01-19 NOTE — Telephone Encounter (Signed)
Prescription obtained and sent to request pharmacy per escribe.

## 2015-01-19 NOTE — Telephone Encounter (Signed)
Gave avs & calendar for July & September.

## 2015-01-19 NOTE — Telephone Encounter (Signed)
Call received from [facility] reporting fax number for Mail order pharmacy she'd like to use.  [facility] Delivery fax [phone].  Will notify collaborative nurse.

## 2015-01-20 ENCOUNTER — Ambulatory Visit
Admission: RE | Admit: 2015-01-20 | Discharge: 2015-01-20 | Disposition: A | Discharge status: Home / Self Care | Payer: Managed Care, Other (non HMO) | Source: Ambulatory Visit | Attending: Radiation Oncology | Admitting: Radiation Oncology

## 2015-01-20 DIAGNOSIS — C50412 Malignant neoplasm of upper-outer quadrant of left female breast: Secondary | ICD-10-CM | POA: Diagnosis not present

## 2015-01-21 ENCOUNTER — Encounter: Payer: Self-pay | Admitting: Radiation Oncology

## 2015-01-21 ENCOUNTER — Ambulatory Visit
Admission: RE | Admit: 2015-01-21 | Discharge: 2015-01-21 | Disposition: A | Discharge status: Home / Self Care | Payer: Managed Care, Other (non HMO) | Source: Ambulatory Visit | Attending: Radiation Oncology | Admitting: Radiation Oncology

## 2015-01-21 ENCOUNTER — Ambulatory Visit
Admission: RE | Admit: 2015-01-21 | Payer: Managed Care, Other (non HMO) | Source: Ambulatory Visit | Admitting: Radiation Oncology

## 2015-01-21 VITALS — BP 122/71 | HR 67 | Temp 97.8°F | Resp 20 | Wt 194.0 lb

## 2015-01-21 DIAGNOSIS — C50412 Malignant neoplasm of upper-outer quadrant of left female breast: Secondary | ICD-10-CM

## 2015-01-21 NOTE — Progress Notes (Signed)
   Department of Radiation Oncology  Phone:  [phone] Fax:        [phone]  Weekly Treatment Note    Name: Meria W Bush Date: 01/21/2015 MRN: [id number] DOB: [birth year]   Current dose: 40 Gy  Current fraction: 16   MEDICATIONS: Current Outpatient Prescriptions  Medication Sig Dispense Refill  . acetaminophen (TYLENOL) 500 MG tablet Take 500 mg by mouth every 6 (six) hours as needed.    Marland Kitchen aMILoride (MIDAMOR) 5 MG tablet Take 5 mg by mouth daily.    . B Complex-C (B-COMPLEX WITH VITAMIN C) tablet Take 1 tablet by mouth daily.    . Cholecalciferol (VITAMIN D3) 2000 UNITS TABS Take 1 tablet by mouth daily.    . hyaluronate sodium (RADIAPLEXRX) GEL Apply 1 application topically 2 (two) times daily.    . hydrochlorothiazide (MICROZIDE) 12.5 MG capsule Take 1 capsule (12.5 mg total) by mouth daily. 30 capsule 5  . ketoconazole (NIZORAL) 2 % shampoo Apply 1 application topically 2 (two) times a week.    . metoprolol succinate (TOPROL-XL) 50 MG 24 hr tablet Take 50 mg by mouth daily. Take with or immediately following a meal.    . non-metallic deodorant (ALRA) MISC Apply 1 application topically daily as needed.    . pantoprazole (PROTONIX) 20 MG tablet Take 20 mg by mouth daily as needed for heartburn.     . simvastatin (ZOCOR) 20 MG tablet Take 20 mg by mouth daily.    . Telmisartan-Amlodipine 80-5 MG TABS Take 1 tablet by mouth daily.  3  . anastrozole (ARIMIDEX) 1 MG tablet Take 1 tablet (1 mg total) by mouth daily. (Patient not taking: Reported on 01/21/2015) 90 tablet 3   No current facility-administered medications for this encounter.     ALLERGIES: Lanolin and Tape   LABORATORY DATA:  Lab Results  Component Value Date   WBC 3.7* 12/03/2014   HGB 12.6 12/03/2014   HCT 38.3 12/03/2014   MCV 95.6 12/03/2014   PLT 215 12/03/2014   Lab Results  Component Value Date   NA 141 12/03/2014   K 3.8 12/03/2014   CL 102 11/05/2014   CO2 24 12/03/2014   Lab Results    Component Value Date   ALT 18 12/03/2014   AST 14 12/03/2014   ALKPHOS 57 12/03/2014   BILITOT 0.51 12/03/2014     NARRATIVE: Laura Bush was seen today for weekly treatment management. The chart was checked and the patient's films were reviewed.  Weekly rad tx left breast 16 completed, slight erythema, dryness around nipple area, skin intact, using radaiplex bid, appetite good, BP 122/71 mmHg  Pulse 67  Temp(Src) 97.8 F (36.6 C) (Oral)  Resp 20  Wt 194 lb (87.998 kg)  Wt Readings from Last 3 Encounters:  01/21/15 194 lb (87.998 kg)  01/19/15 192 lb 14.4 oz (87.499 kg)  01/15/15 194 lb 4.8 oz (88.134 kg)   11:00 AM   PHYSICAL EXAMINATION: weight is 194 lb (87.998 kg). Her oral temperature is 97.8 F (36.6 C). Her blood pressure is 122/71 and her pulse is 67. Her respiration is 20.      the patient's skin looks excellent today. Mild erythema.  ASSESSMENT: The patient is doing satisfactorily with treatment.  PLAN: We will continue with the patient's radiation treatment as planned. The patient will follow-up one month after completing her course of radiation treatment.

## 2015-01-21 NOTE — Progress Notes (Signed)
Weekly rad tx left breast 16 completed, slight erythema, dryness around nipple area, skin intact, using radaiplex bid, appetite good, BP 122/71 mmHg  Pulse 67  Temp(Src) 97.8 F (36.6 C) (Oral)  Resp 20  Wt 194 lb (87.998 kg)  Wt Readings from Last 3 Encounters:  01/21/15 194 lb (87.998 kg)  01/19/15 192 lb 14.4 oz (87.499 kg)  01/15/15 194 lb 4.8 oz (88.134 kg)   8:56 AM

## 2015-01-22 ENCOUNTER — Ambulatory Visit: Payer: Managed Care, Other (non HMO) | Admitting: Radiation Oncology

## 2015-01-22 ENCOUNTER — Ambulatory Visit
Admission: RE | Admit: 2015-01-22 | Discharge: 2015-01-22 | Disposition: A | Discharge status: Home / Self Care | Payer: Managed Care, Other (non HMO) | Source: Ambulatory Visit | Attending: Radiation Oncology | Admitting: Radiation Oncology

## 2015-01-22 DIAGNOSIS — C50412 Malignant neoplasm of upper-outer quadrant of left female breast: Secondary | ICD-10-CM | POA: Diagnosis not present

## 2015-01-23 ENCOUNTER — Ambulatory Visit
Admission: RE | Admit: 2015-01-23 | Discharge: 2015-01-23 | Disposition: A | Discharge status: Home / Self Care | Payer: Managed Care, Other (non HMO) | Source: Ambulatory Visit | Attending: Radiation Oncology | Admitting: Radiation Oncology

## 2015-01-23 DIAGNOSIS — C50412 Malignant neoplasm of upper-outer quadrant of left female breast: Secondary | ICD-10-CM | POA: Diagnosis not present

## 2015-01-26 ENCOUNTER — Ambulatory Visit
Admission: RE | Admit: 2015-01-26 | Discharge: 2015-01-26 | Disposition: A | Discharge status: Home / Self Care | Payer: Managed Care, Other (non HMO) | Source: Ambulatory Visit | Attending: Radiation Oncology | Admitting: Radiation Oncology

## 2015-01-26 DIAGNOSIS — C50412 Malignant neoplasm of upper-outer quadrant of left female breast: Secondary | ICD-10-CM | POA: Diagnosis not present

## 2015-01-27 ENCOUNTER — Ambulatory Visit: Payer: Managed Care, Other (non HMO)

## 2015-01-27 ENCOUNTER — Ambulatory Visit
Admission: RE | Admit: 2015-01-27 | Discharge: 2015-01-27 | Disposition: A | Discharge status: Home / Self Care | Payer: Managed Care, Other (non HMO) | Source: Ambulatory Visit | Attending: Radiation Oncology | Admitting: Radiation Oncology

## 2015-01-27 ENCOUNTER — Encounter: Payer: Self-pay | Admitting: Radiation Oncology

## 2015-01-27 DIAGNOSIS — C50412 Malignant neoplasm of upper-outer quadrant of left female breast: Secondary | ICD-10-CM | POA: Diagnosis not present

## 2015-02-06 ENCOUNTER — Telehealth: Payer: Self-pay | Admitting: *Deleted

## 2015-02-06 NOTE — Telephone Encounter (Signed)
Spoke to pt concerning needs after xrt Relate doing well and denies complaints. Encourage pt to call with questions or concerns.  Received verbal understanding.

## 2015-02-17 ENCOUNTER — Ambulatory Visit
Admission: RE | Admit: 2015-02-17 | Discharge: 2015-02-17 | Disposition: A | Discharge status: Home / Self Care | Payer: Managed Care, Other (non HMO) | Source: Ambulatory Visit | Attending: Oncology | Admitting: Oncology

## 2015-02-17 DIAGNOSIS — C50412 Malignant neoplasm of upper-outer quadrant of left female breast: Secondary | ICD-10-CM

## 2015-02-18 NOTE — Progress Notes (Signed)
  Radiation Oncology         [phone]) [phone] ________________________________  Name: Laura Bush MRN: [id number]  Date: 01/27/2015  DOB: [birth year]  End of Treatment Note  Diagnosis:   Left-sided breast cancer     Indication for treatment:  Curative       Radiation treatment dates:   12/30/2014 through 01/27/2015  Site/dose:   The patient initially received a dose of 42.5 Gy in 17 fractions to the breast using whole-breast tangent fields. This was delivered using a 3-D conformal technique. The patient then received a boost to the seroma. This delivered an additional 7.5 Gy in 3 fractions using a 3 field photon technique due to the depth of the seroma. The total dose was 50 Gy.  Narrative: The patient tolerated radiation treatment relatively well.   The patient had some expected skin irritation as she progressed during treatment. Moist desquamation was not present at the end of treatment.  Plan: The patient has completed radiation treatment. The patient will return to radiation oncology clinic for routine followup in one month. I advised the patient to call or return sooner if they have any questions or concerns related to their recovery or treatment. ________________________________  Jodelle Gross, M.D., Ph.D.

## 2015-02-18 NOTE — Addendum Note (Signed)
Encounter addended by: Kyung Rudd, MD on: 02/18/2015  9:11 AM<BR>     Documentation filed: Notes Section, Visit Diagnoses

## 2015-02-18 NOTE — Progress Notes (Signed)
Complex simulation note  Diagnosis: Left-sided breast cancer  Narrative The patient has initially been planned to receive a course of whole breast radiation to a dose of 42.5 Gy in 17 fractions. The patient will now receive an additional boost to the seroma cavity which has been contoured. This will correspond to a boost of 7.5 Gy at 2.5 Gy per fraction. To accomplish this, an additional 3 customized blocks have been designed for this purpose. A complex isodose plan is requested to ensure that the target area is adequately covered with radiation dose and that the nearby normal structures such as the lung are adequately spared. The patient's final total dose will be 50 Gy.  ------------------------------------------------  Laura Eyerman S. Axavier Pressley, MD, PhD 

## 2015-02-18 NOTE — Progress Notes (Signed)
  Radiation Oncology         (336) [phone] ________________________________  Name: Laura Bush MRN: [id number]  Date: 12/22/2014  DOB: 1950-08-11  SIMULATION AND TREATMENT PLANNING NOTE  The patient presented for simulation prior to beginning her course of radiation treatment for her diagnosis of left-sided breast cancer. The patient was placed in a supine position on a breast board. A customized vac-lock bag was constructed and this complex treatment device will be used on a daily basis during her treatment. In this fashion, a CT scan was obtained through the chest area and an isocenter was placed near the chest wall within the breast.  The patient will be planned to receive a course of radiation initially to a dose of 42.5 Gy. This will consist of a whole breast radiotherapy technique. To accomplish this, 2 customized blocks have been designed which will correspond to medial and lateral whole breast tangent fields. This treatment will be accomplished at 2.5 Gy per fraction. A forward planning technique will also be evaluated to determine if this approach improves the plan. It is anticipated that the patient will then receive a 7.5 Gy boost to the seroma cavity which has been contoured. This will be accomplished at 2.5 Gy per fraction.   A breath hold technique was evaluated and this was demonstrated to significantly improved cardiac sparing in terms of radiation dose. This will be used during the patient's treatment.  This initial treatment will consist of a 3-D conformal technique. The seroma has been contoured as the primary target structure. Additionally, dose volume histograms of both this target as well as the lungs and heart will also be evaluated. Such an approach is necessary to ensure that the target area is adequately covered while the nearby critical normal structures are adequately spared.  Plan:  The final anticipated total dose therefore will correspond to 50  Gy.    _______________________________   Jodelle Gross, MD, PhD

## 2015-02-20 ENCOUNTER — Telehealth: Payer: Self-pay | Admitting: Adult Health

## 2015-02-20 NOTE — Telephone Encounter (Signed)
I spoke with Laura Bush regarding her eligibility to see [place] in the Survivorship Clinic now that she has completed active treatment for breast cancer.  She is scheduled to see Dr. Lisbeth Renshaw for her 56-monthf/u visit with him on 03/02/15, so she has agreed to see [place] survivorship after that visit.   She is now scheduled for 03/02/15 at 8:30am.  I gave her my direct office number and encouraged her to call me with any questions or concerns before her next visit here.  I look forward to participating in her care.   GMike Craze NP [phone]

## 2015-02-24 ENCOUNTER — Encounter (HOSPITAL_COMMUNITY): Payer: Self-pay

## 2015-02-25 ENCOUNTER — Encounter: Payer: Self-pay | Admitting: Radiation Oncology

## 2015-03-01 ENCOUNTER — Encounter: Payer: Self-pay | Admitting: Adult Health

## 2015-03-02 ENCOUNTER — Encounter: Payer: Self-pay | Admitting: Adult Health

## 2015-03-02 ENCOUNTER — Ambulatory Visit
Admission: RE | Admit: 2015-03-02 | Discharge: 2015-03-02 | Disposition: A | Discharge status: Home / Self Care | Payer: Managed Care, Other (non HMO) | Source: Ambulatory Visit | Attending: Radiation Oncology | Admitting: Radiation Oncology

## 2015-03-02 ENCOUNTER — Ambulatory Visit (HOSPITAL_BASED_OUTPATIENT_CLINIC_OR_DEPARTMENT_OTHER): Payer: Managed Care, Other (non HMO) | Admitting: Adult Health

## 2015-03-02 ENCOUNTER — Encounter: Payer: Self-pay | Admitting: Radiation Oncology

## 2015-03-02 VITALS — BP 123/66 | HR 60 | Temp 97.6°F | Resp 20

## 2015-03-02 VITALS — BP 123/66 | HR 60 | Temp 97.6°F | Resp 20 | Wt 195.8 lb

## 2015-03-02 DIAGNOSIS — Z853 Personal history of malignant neoplasm of breast: Secondary | ICD-10-CM | POA: Diagnosis not present

## 2015-03-02 DIAGNOSIS — C50911 Malignant neoplasm of unspecified site of right female breast: Secondary | ICD-10-CM

## 2015-03-02 DIAGNOSIS — C50912 Malignant neoplasm of unspecified site of left female breast: Principal | ICD-10-CM

## 2015-03-02 HISTORY — DX: Personal history of irradiation: Z92.3

## 2015-03-02 NOTE — Progress Notes (Signed)
Radiation Oncology         [phone]) [phone] ________________________________  Name: Laura Bush MRN: [id number]  Date: 03/02/2015  DOB: 04-06-1951  Follow-Up Visit Note  CC: Sheral Flow, NP  Fanny Skates, MD  Diagnosis:   Left-sided breast cancer  Interval Since Last Radiation:  Approximately one month   Narrative:  The patient returns today for routine follow-up.  She has done well overall since she finished treatment. The patient's skin has healed significantly since she completed her course of radiation treatment. She has begun anti-hormonal treatment.          Follow up left breast Radiation 12/30/14-01/27/15 Well healed breast, started Arimidex '1mg'$  oral daioly since 02/19/15, Next visit with Dr. Jana Hakim 04/29/15, and will see Mike Craze , after this visit today No pain, appetite, mild fatigue She notes relatively good energy                      ALLERGIES:  is allergic to lanolin and tape.  Meds: Current Outpatient Prescriptions  Medication Sig Dispense Refill  . acetaminophen (TYLENOL) 500 MG tablet Take 500 mg by mouth every 6 (six) hours as needed.    Marland Kitchen aMILoride (MIDAMOR) 5 MG tablet Take 5 mg by mouth daily.    Marland Kitchen anastrozole (ARIMIDEX) 1 MG tablet Take 1 tablet (1 mg total) by mouth daily. 90 tablet 3  . B Complex-C (B-COMPLEX WITH VITAMIN C) tablet Take 1 tablet by mouth daily.    . Cholecalciferol (VITAMIN D3) 2000 UNITS TABS Take 1 tablet by mouth daily.    . hydrochlorothiazide (MICROZIDE) 12.5 MG capsule Take 1 capsule (12.5 mg total) by mouth daily. 30 capsule 5  . ketoconazole (NIZORAL) 2 % shampoo Apply 1 application topically 2 (two) times a week.    . metoprolol succinate (TOPROL-XL) 50 MG 24 hr tablet Take 50 mg by mouth daily. Take with or immediately following a meal.    . pantoprazole (PROTONIX) 20 MG tablet Take 20 mg by mouth daily as needed for heartburn.     . simvastatin (ZOCOR) 20 MG tablet Take 20 mg by mouth daily.    .  Telmisartan-Amlodipine 80-5 MG TABS Take 1 tablet by mouth daily.  3  . hyaluronate sodium (RADIAPLEXRX) GEL Apply 1 application topically 2 (two) times daily.    . non-metallic deodorant Jethro Poling) MISC Apply 1 application topically daily as needed.     No current facility-administered medications for this encounter.    Physical Findings: The patient is in no acute distress. Patient is alert and oriented.  weight is 195 lb 12.8 oz (88.814 kg). Her oral temperature is 97.6 F (36.4 C). Her blood pressure is 123/66 and her pulse is 60. Her respiration is 20. .   The skin in the treatment area has healed satisfactorily, no areas of concern/moist desquamation/poor healing  Lab Findings: Lab Results  Component Value Date   WBC 3.7* 12/03/2014   HGB 12.6 12/03/2014   HCT 38.3 12/03/2014   MCV 95.6 12/03/2014   PLT 215 12/03/2014     Radiographic Findings: Dg Bone Density  02/17/2015   CLINICAL DATA:  Postmenopausal. History of hysterectomy. History of breast cancer in 2016.  EXAM: DUAL X-RAY ABSORPTIOMETRY (DXA) FOR BONE MINERAL DENSITY  FINDINGS: AP LUMBAR SPINE L1 through L4  Bone Mineral Density (BMD):  1.014 g/cm2  Young Adult T-Score:  -0.3  Z-Score:  1.4  Left FEMUR neck  Bone Mineral Density (BMD):  0.745 g/cm2  Annamaria Boots  Adult T-Score: -0.9  Z-Score:  0.5  ASSESSMENT: Patient's diagnostic category is NORMAL by WHO Criteria.  FRACTURE RISK: NOT INCREASED  FRAX:  Not reported because all T-scores are at or above -1.0  COMPARISON: None.  Effective therapies are available in the form of bisphosphonates, selective estrogen receptor modulators, biologic agents, and hormone replacement therapy (for women). All patients should ensure an adequate intake of dietary calcium (1200 mg daily) and vitamin D (800 IU daily) unless contraindicated.  All treatment decisions require clinical judgment and consideration of individual patient factors, including patient preferences, co-morbidities, previous drug use,  risk factors not captured in the FRAX model (e.g., frailty, falls, vitamin D deficiency, increased bone turnover, interval significant decline in bone density) and possible under- or over-estimation of fracture risk by FRAX.  The National Osteoporosis Foundation recommends that FDA-approved medical therapies be considered in postmenopausal women and men age 75 or older with a:  1. Hip or vertebral (clinical or morphometric) fracture.  2. T-score of -2.5 or lower at the spine or hip.  3. Ten-year fracture probability by FRAX of 3% or greater for hip fracture or 20% or greater for major osteoporotic fracture.  People with diagnosed cases of osteoporosis or at high risk for fracture should have regular bone mineral density tests. For patients eligible for Medicare, routine testing is allowed once every 2 years. The testing frequency can be increased to one year for patients who have rapidly progressing disease, those who are receiving or discontinuing medical therapy to restore bone mass, or have additional risk factors.  World Pharmacologist [facility]) Criteria:  Normal: T-scores from +1.0 to -1.0  Low Bone Mass (Osteopenia): T-scores between -1.0 and -2.5  Osteoporosis: T-scores -2.5 and below  Comparison to Reference Population:  T-score is the key measure used in the diagnosis of osteoporosis and relative risk determination for fracture. It provides a value for bone mass relative to the mean bone mass of a young adult reference population expressed in terms of standard deviation (SD).  Z-score is the age-matched score showing the patient's values compared to a population matched for age, sex, and race. This is also expressed in terms of standard deviation. The patient may have values that compare favorably to the age-matched values and still be at increased risk for fracture.   Electronically Signed   By: Franki Cabot M.D.   On: 02/17/2015 13:35    Impression:    The patient has done satisfactorily since  finishing treatment. She has begun anti-hormonal treatment.  Plan:  The patient will followup in our clinic on a when necessary basis. She is seeing Mike Craze today.   ------------------------------------------------  Jodelle Gross, MD, PhD  This document serves as a record of services personally performed by Kyung Rudd, MD. It was created on his behalf by Derek Mound, a trained medical scribe. The creation of this record is based on the scribe's personal observations and the provider's statements to them. This document has been checked and approved by the attending provider.

## 2015-03-02 NOTE — Progress Notes (Addendum)
Follow up left breast  Radiation 12/30/14-01/27/15  Well healed breast, started Arimidex '1mg'$  oral daioly since 02/19/15,  Next visit with Dr. Jana Hakim 04/29/15, and will see Mike Craze , after this viosit today No pain, appetite, mild fatigue 8:09 AM BP 123/66 mmHg  Pulse 60  Temp(Src) 97.6 F (36.4 C) (Oral)  Resp 20  Wt 195 lb 12.8 oz (88.814 kg)  Wt Readings from Last 3 Encounters:  03/02/15 195 lb 12.8 oz (88.814 kg)  01/21/15 194 lb (87.998 kg)  01/19/15 192 lb 14.4 oz (87.499 kg)

## 2015-03-04 NOTE — Progress Notes (Signed)
CLINIC:  Cancer Survivorship   REASON FOR VISIT:  Routine follow-up post-treatment for a recent history of breast cancer.  BRIEF ONCOLOGIC HISTORY:    Bilateral breast cancer   08/25/2014 Breast US (completed in [place]): Left breast with 0.7 cm ill-defined hypoechoic mass   09/18/2014 Initial Biopsy Left breast needle core biopsy (11 o'clock): Grade 2, IDC, DCIS. ER+ (100%), PR+ (43%), HER2- by FISH. Ki67 30%.    09/22/2014 Initial Diagnosis Breast cancer of upper-outer quadrant of left female breast   09/26/2014 Breast MRI Left breast (UIQ): Enhancing mass measuring 1.2 x 0.8 x 1 cm. There is also a 5.5cm area of linear enhancement extending anteriorly from mass. Right breast without mass or abnormal enhancement. No abnormal appearing LNs.    10/07/2014 Procedure Additional left breast needle core biopsy: ADH   11/11/2014 Definitive Surgery Left lumpectomy with SLNB & bilat mammoplasty Dalbert Batman). LEFT breast: IDC, spanning 1.4cm, grade 3. Left axillary 2 LN neg. HER2 repeated and neg (ratio 1.18). (-) margins.  RIGHT breast: Grade 2, DCIS w/necrosis & calcs. ER+ (100%), PR+ (71%).    11/11/2014 Pathologic Stage LEFT: pT1b, pN0: Stage IA.  RIGHT: Tis: Stage 0   11/11/2014 Oncotype testing Recurrence score: 11 (8% ROR). No chemo (Magrinat).   12/30/2014 - 01/27/2015 Radiation Therapy Adjuvant RT completed Lisbeth Renshaw). Left breast: 42.5 Gy over 17 fractions. Left breast boost: 7.5 Gy over 3 fractions.    02/19/2015 -  Anti-estrogen oral therapy Anastrazole daily. Planned duration of treatment: 10 years.    03/02/2015 Survivorship Survivorship Care Plan given to patient and reviewed during in-person visit.     INTERVAL HISTORY:  Laura Bush presents to the [facility] today for our initial meeting to review her survivorship care plan detailing her treatment course for breast cancer, as well as monitoring long-term side effects of that treatment, education regarding health maintenance, screening, and  overall wellness and health promotion.     Overall, Laura Bush reports feeling quite well since completing her radiation therapy approximately one month ago.  She endorses some mild left breast pain that she describes as sharp "zingers" that are intermittent and sharp/stabbing kind of discomfort.  This pain is sometimes worse at night, requiring her to take OTC Tylenol or Aleve which offers relief.  She states that the pain is improving with time and is continuing to improve.  She also reports some mild discomfort on the scar of her right breast mammoplasty surgery, but she states "I scar easily and sometimes it makes the scars tender for awhile but they usually get better with time."  She reports that her skin on the left breast has healed well since completing radiation.  Laura Bush started her anti-estrogen therapy with anastrazole on 02/19/15 and is overall tolerating it well.  She has had a little bit of nausea, but is unsure if it related to the medication or not. She's also had a few minor hot flashes and minor joint aches/pains.  She endorses having some fear of the cancer recurring.  She has a great support system in her church and faith, her husband, her children, and her friends.  She recently welcomed twin grandchildren to her family which are bringing her great joy.    REVIEW OF SYSTEMS:  General: Denies fever, chills, unintentional weight loss, or generalized fatigue.  Cardiac: Denies palpitations, chest pain, and lower extremity edema.  Respiratory: Denies cough, shortness of breath, and dyspnea on exertion.  GI: Denies abdominal pain, constipation, diarrhea, nausea, or vomiting.  GU: Denies dysuria, hematuria, vaginal bleeding, vaginal discharge. Musculoskeletal: Denies any new joint or bone pain.  Neuro: Denies headache or recent falls. Denies peripheral neuropathy. Skin: Denies rash, pruritis, or open wounds.  Breast: Denies any new nodularity, masses, tenderness, nipple changes, or  nipple discharge.  Psych: Denies depression, generalized anxiety, insomnia.  Endorses fear of cancer recurrence per HPI.   A 14-point review of systems was completed and was negative, except as noted above.   ONCOLOGY TREATMENT TEAM:  1. Surgeon:  Dr. Dalbert Batman at [facility] 2. Medical Oncologist: Dr. Jana Hakim 3. Radiation Oncologist: Dr. Lisbeth Renshaw    PAST MEDICAL/SURGICAL HISTORY:  Past Medical History  Diagnosis Date  . Hypertension   . Venous insufficiency   . Asthma     in cold weather  . GERD (gastroesophageal reflux disease)   . Microscopic colitis   . Rosacea   . PONV (postoperative nausea and vomiting)   . High cholesterol   . Heart murmur     as a child (has outgrown)  . Pneumonia 2000's X 1  . OSA on CPAP   . Cluster headaches     in her 40's  . Breast cancer of upper-outer quadrant of left female breast 09/22/2014  . 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 Breast cancer 09/18/14    left breast  . Breast cancer 10/07/14    bx left breast  . S/P radiation therapy 12/30/14-01/27/15    left breast 50Gy total dose   Past Surgical History  Procedure Laterality Date  . Cesarean section  1978; 1980  . Tubal ligation  ?1984  . Abdominal hysterectomy  1999  . Knee arthroscopy Bilateral     meniscus repair  . Colonoscopy    . Carpal tunnel release Bilateral     2 surgeries on right, 1 on left  . Forearm fracture surgery Right 2003 X 3  . Partial mastectomy with axillary sentinel lymph node biopsy Left 11/11/2014  . Mastectomy complete / simple w/ sentinel node biopsy Left 11/11/2014  . Tonsillectomy  ~ 1957  . Appendectomy  ~ 2006  . Fracture surgery    . Reduction mammaplasty Bilateral 11/11/2014  . Breast biopsy Left 10/2014; 10/2014  . Squamous cell carcinoma excision  1980's X 1    "nose"  . Partial mastectomy with needle localization and axillary sentinel lymph node bx Left 11/11/2014    Procedure: LEFT BREAST PARTIAL MASTECTOMY WITH NEEDLE LOCALIZATION TIMES  TWO AND LEFT AXILLARY SENTINEL LYMPH NODE Biopsy;  Surgeon: Fanny Skates, MD;  Location: [place];  Service: General;  Laterality: Left;  . Breast reduction surgery Bilateral 11/11/2014    Procedure: Bilateral Breast Reduction;  Surgeon: Crissie Reese, MD;  Location: [facility];  Service: Plastics;  Laterality: Bilateral;     ALLERGIES:  Allergies  Allergen Reactions  . Lanolin   . Tape Other (See Comments)    Redness (Bandaids also)     CURRENT MEDICATIONS:  Current Outpatient Prescriptions on File Prior to Visit  Medication Sig Dispense Refill  . acetaminophen (TYLENOL) 500 MG tablet Take 500 mg by mouth every 6 (six) hours as needed.    Marland Kitchen aMILoride (MIDAMOR) 5 MG tablet Take 5 mg by mouth daily.    Marland Kitchen anastrozole (ARIMIDEX) 1 MG tablet Take 1 tablet (1 mg total) by mouth daily. 90 tablet 3  . B Complex-C (B-COMPLEX WITH VITAMIN C) tablet Take 1 tablet by mouth daily.    . Cholecalciferol (VITAMIN D3) 2000 UNITS TABS  Take 1 tablet by mouth daily.    . hyaluronate sodium (RADIAPLEXRX) GEL Apply 1 application topically 2 (two) times daily.    . hydrochlorothiazide (MICROZIDE) 12.5 MG capsule Take 1 capsule (12.5 mg total) by mouth daily. 30 capsule 5  . ketoconazole (NIZORAL) 2 % shampoo Apply 1 application topically 2 (two) times a week.    . metoprolol succinate (TOPROL-XL) 50 MG 24 hr tablet Take 50 mg by mouth daily. Take with or immediately following a meal.    . non-metallic deodorant (ALRA) MISC Apply 1 application topically daily as needed.    . pantoprazole (PROTONIX) 20 MG tablet Take 20 mg by mouth daily as needed for heartburn.     . simvastatin (ZOCOR) 20 MG tablet Take 20 mg by mouth daily.    . Telmisartan-Amlodipine 80-5 MG TABS Take 1 tablet by mouth daily.  3   No current facility-administered medications on file prior to visit.     ONCOLOGIC FAMILY HISTORY:  Family History  Problem Relation Age of Onset  . Uterine cancer Sister 19  . Stroke Father     deceased  .  Dementia Mother     Lives in [place]     GENETIC COUNSELING/TESTING: None  SOCIAL HISTORY:  Meegan W Bush is married and lives with her husband in [place], [place].  They moved to [place] from [place] in December 2015 to be closer to their son.  They will be moving to [facility] soon.  They have 2 children, a son who lives in [place] and a daughter who lives in [place] Her son recently had twins, a boy Mallie Mussel) and girl Lorre Nick).  Laura Bush is currently retired. She previously worked for an [employer] for which her husband was the [employer].  She denies any current or history of tobacco, alcohol, or illicit drug use.     PHYSICAL EXAMINATION:  Vital Signs:   Filed Vitals:   03/02/15 0810  BP: 123/66  Pulse: 60  Temp: 97.6 F (36.4 C)  Resp: 20   General: Well-nourished, well-appearing female in no acute distress.  She is unaccompanied in clinic today.  HEENT: Head is atraumatic and normocephalic.  Pupils equal and reactive to light and accomodation. Conjunctivae clear without exudate.  Sclerae anicteric. Oral mucosa is pink, moist, and intact without lesions.  Oropharynx is pink without lesions or erythema.  Lymph: No cervical, supraclavicular, infraclavicular, or axillary lymphadenopathy noted on palpation.  Cardiovascular: Regular rate and rhythm without murmurs, rubs, or gallops. Respiratory: Clear to auscultation bilaterally. Chest expansion symmetric without accessory muscle use on inspiration or expiration.  GI: Abdomen soft and round. No tenderness to palpation. Bowel sounds normoactive in 4 quadrants. No hepatosplenomegaly.   GU: Deferred.  Musculoskeletal: Muscle strength 5/5 in all extremities.  Full ROM noted in all extremities.  Neuro: No focal deficits. Steady gait.  Psych: Mood and affect normal and appropriate for situation.  Extremities: No edema, cyanosis, or clubbing.  Skin: Warm and dry. No open lesions noted.    LABORATORY DATA:  None for this visit.  DIAGNOSTIC IMAGING:  None for this visit.     ASSESSMENT AND PLAN:   1. History of breast cancer:  Laura Bush will follow-up with her medical oncologist, Dr. Jana Hakim in 04/2015 with history and physical exam per surveillance protocol.  She will continue her anti-estrogen therapy with  anastrazole as prescribed by Dr. Jana Hakim. So far, Laura Bush is tolerating the anastrazole pretty well, with some minor complaints of arthralgias  and mild hot flashes.  She was instructed to make Dr. Jana Hakim or myself aware if she begins to experience any additional or worsening side effects of the medication and I could see her back in clinic to help manage those effects, as needed.  Common side effects of anastrazole were again reviewed with her as well. A comprehensive survivorship care plan and treatment summary was reviewed with the patient today detailing her breast cancer diagnosis, treatment course, potential late/long-term effects of treatment, appropriate follow-up care with recommendations for the future, and patient education resources.  A copy of this summary, along with a letter will be sent to the patient's primary care provider via mail/fax/In Basket message after today's visit.  Laura Bush is welcome to return to the Survivorship Clinic in the future, as needed; no follow-up will be scheduled at this time.    2. Mild breast pain (expected): Laura Bush is continuing to experience what is likely some nerve pain in her bilateral breasts s/p left lumpectomy and bilateral mammoplasty.  We discussed that this type of breast pain is extremely common in women after breast cancer surgery and subsequent radiation therapy.  It is possible that this discomfort could continue chronically, but likely the pain will improve with time and become less severe and less frequent.  I encouraged her to continue to take OTC analgesics, as needed, with the hope that she will not need any  pain medication intervention in the coming weeks to months as she continues to heal.  Laura Bush voiced verbal understanding of this plan and agrees with it.    3. Vaginal dryness prevention/Sexual health & intimacy: We discussed the importance of vaginal health in women after breast cancer, particularly when taking anti-estrogen therapy like anastrazole.  We reviewed the difference between vaginal moisturizers and lubricants and when each should be used.  She is already registered to attend the "Healthy Pelvic Floor & Intimacy" class at the cancer center, which is facilitated by our Pelvic Floor Rehab Physical Therapist.  I encouraged Laura Bush to reach out to me if she needs any additional advice or potentially a formal referral to pelvic floor rehab, if needed in the future.   4. Bone health:  Given Ms. Mullinix age, history of breast cancer, and her current treatment regimen including anti-estrogen therapy with anastrazole, she is at risk for bone demineralization.  Her last DEXA scan was on 02/17/15 and was normal.  She will be eligible for additional DEXA imaging in about 2 years, or sooner if clinically indicated.  In the meantime, she was encouraged to increase her consumption of foods rich in calcium, as well as increase her weight-bearing activities.  She was given education on specific activities to promote bone health.  5. Cancer screening:  Due to Ms. Bury history and her age, she should receive screening for skin cancers, colon cancer, and gynecologic cancers.  The information and recommendations are listed on the patient's comprehensive care plan/treatment summary and were reviewed in detail with the patient.    6. Health maintenance and wellness promotion: Laura Bush was encouraged to consume 5-7 servings of fruits and vegetables per day. We reviewed the "Nutrition Rainbow" handout, as well as the handout about "Nutrition for Breast Cancer Survivors."  She was also encouraged to engage in  moderate to vigorous exercise for 30 minutes per day most days of the week. We discussed the [employer] fitness program, which is designed for cancer survivors to help them become more physically fit after  cancer treatments.  She was given the [employer] brochure and has the contact information for the coordinator if she chooses to participate in this program.  She was instructed to limit her alcohol consumption and continue to abstain from tobacco use.    7. Support services/counseling: It is not uncommon for this period of the patient's cancer care trajectory to be one of many emotions and stressors.  We discussed an opportunity for her to participate in the next session of [facility] ("Finding Your New Normal") support group series designed for patients after they have completed treatment.  She is already registered for the [facility] course and is looking forward to learning more about her new life after cancer.  We discussed fear of cancer recurrence and the difficulty of living with uncertainty. She was offered support today through active listening and expressive supportive counseling.   Laura Bush was encouraged to take advantage of our many other support services programs, support groups, and/or counseling in coping with her new life as a cancer survivor after completing anti-cancer treatment.  She was given information regarding our available services and encouraged to contact me with any questions or for help enrolling in any of our support group/programs.    A total of 55 minutes of face-to-face time was spent with this patient with greater than 50% of that time in counseling and care-coordination.   Mike Craze, NP Survivorship Program [employer] [phone]   Note: PRIMARY CARE PROVIDER Sheral Flow, [place] [phone]

## 2015-03-16 ENCOUNTER — Encounter: Payer: Self-pay | Admitting: Adult Health

## 2015-03-16 NOTE — Progress Notes (Signed)
A birthday card was mailed to the patient today on behalf of the Survivorship Program at [facility].   Yamila Cragin, NP Survivorship Program [facility] [phone]  

## 2015-03-30 ENCOUNTER — Telehealth: Payer: Self-pay | Admitting: Primary Care

## 2015-04-29 ENCOUNTER — Ambulatory Visit (HOSPITAL_BASED_OUTPATIENT_CLINIC_OR_DEPARTMENT_OTHER): Payer: Managed Care, Other (non HMO) | Admitting: Oncology

## 2015-04-29 ENCOUNTER — Telehealth: Payer: Self-pay | Admitting: Oncology

## 2015-04-29 VITALS — BP 106/62 | HR 63 | Temp 98.5°F | Resp 18 | Ht 59.0 in | Wt 191.0 lb

## 2015-04-29 DIAGNOSIS — C50911 Malignant neoplasm of unspecified site of right female breast: Secondary | ICD-10-CM | POA: Diagnosis not present

## 2015-04-29 DIAGNOSIS — C50912 Malignant neoplasm of unspecified site of left female breast: Secondary | ICD-10-CM | POA: Diagnosis not present

## 2015-04-29 MED ORDER — GABAPENTIN 300 MG PO CAPS: 300.0000 mg | ORAL_CAPSULE | Freq: Every day | ORAL | Status: DC

## 2015-04-29 NOTE — Telephone Encounter (Signed)
Appointments made and avs printed for patient,due to hold time for the breast center i will be faxing her orders for the mammo and mri and patient is aware

## 2015-04-29 NOTE — Progress Notes (Signed)
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 While still there, she had routine screening mammography with tomography 02/14/2014, which showed only a tiny breast cysts. Six-month follow-up exam was performed 08/25/2014, and now there was evidence of a possible focal asymmetry in the left breast. On 08/25/2014 at the [facility] of radiology she underwent a unilateral diagnostic mammography with tomography , and this confirmed a spiculated mass in the left breast superiorly. Sonography of this area found a poorly defined hypoechoic mass measuring 0.7 cm at the 11:00 position 8 cm from the nipple. There was also an adjacent cluster of cysts with a benign circumcised appearance.   Biopsy of the mass in question was recommended, but as the patient was moving to this area, this was performed here on 09/18/2014. The pathology from that procedure ([facility] [id number]) found an invasive ductal carcinoma, grade 2, estrogen receptor 100% positive, with strong staining intensity, progesterone receptor 43% positive, with strong staining intensity, with an MIB-1 of 30%, and no HER-2  amplification.    on 09/26/2014 the patient underwent bilateral breast MRI at [facility] imaging, showing a breast density category B. The right breast was unremarkable and there were no lymph nodes of concern. In the upper inner quadrant of the left breast there was an enhancing mass measuring 1.2 cm. In addition there was a 5.5 cm area of linear enhancement extending anteriorly from this mass. 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   The patient's subsequent history is as detailed below  INTERVAL HISTORY:  [name] returns today for follow-up of her breast cancers. She is participating in the "finding your new normal" group and enjoying it. She also came to the "intimacy and pelvic health" session and found that very helpful.  She has started anastrozole. She calls it "menopause light". She is having hot flashes, but they are not that frequent or that intense. They can however wake her up at night. Aside from that in the vaginal dryness she is tolerating anastrozole well. She obtains it for $20 for 3 month supply will complete next week.  REVIEW OF SYSTEMS: [name] she still has "zingers" in both breasts. This is a very common postoperative experience and hopefully it will fade with time. She has had some diarrhea for the last 3 days. She thinks she may have eaten some "bad shrimp". She started Imodium yesterday. She has not had cramps, or blood in her stools, or mucous in her stool. There has not been any black tarry or bloody stools. She exercises every morning walking 30-45 minutes. Aside from these issues a detailed review of systems today was stable.  PAST MEDICAL HISTORY: Past Medical History  Diagnosis Date  . Hypertension   . Venous insufficiency   .  Asthma     in cold weather  . GERD (gastroesophageal reflux disease)   . Microscopic colitis   . Rosacea   . PONV (postoperative nausea and vomiting)   . High cholesterol   . Heart murmur     as a child (has outgrown)  .  Pneumonia 2000's X 1  . OSA on CPAP   . Cluster headaches     in her 40's  . Breast cancer of upper-outer quadrant of left female breast 09/22/2014  . 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 Breast cancer 09/18/14    left breast  . Breast cancer 10/07/14    bx left breast  . S/P radiation therapy 12/30/14-01/27/15    left breast 50Gy total dose    PAST SURGICAL HISTORY: Past Surgical History  Procedure Laterality Date  . Cesarean section  1978; 1980  . Tubal ligation  ?1984  . Abdominal hysterectomy  1999  . Knee arthroscopy Bilateral     meniscus repair  . Colonoscopy    . Carpal tunnel release Bilateral     2 surgeries on right, 1 on left  . Forearm fracture surgery Right 2003 X 3  . Partial mastectomy with axillary sentinel lymph node biopsy Left 11/11/2014  . Mastectomy complete / simple w/ sentinel node biopsy Left 11/11/2014  . Tonsillectomy  ~ 1957  . Appendectomy  ~ 2006  . Fracture surgery    . Reduction mammaplasty Bilateral 11/11/2014  . Breast biopsy Left 10/2014; 10/2014  . Squamous cell carcinoma excision  1980's X 1    "nose"  . Partial mastectomy with needle localization and axillary sentinel lymph node bx Left 11/11/2014    Procedure: LEFT BREAST PARTIAL MASTECTOMY WITH NEEDLE LOCALIZATION TIMES TWO AND LEFT AXILLARY SENTINEL LYMPH NODE Biopsy;  Surgeon: Fanny Skates, MD;  Location: [facility];  Service: General;  Laterality: Left;  . Breast reduction surgery Bilateral 11/11/2014    Procedure: Bilateral Breast Reduction;  Surgeon: Crissie Reese, MD;  Location: [place];  Service: Plastics;  Laterality: Bilateral;    FAMILY HISTORY Family History  Problem Relation Age of Onset  . Uterine cancer Sister 45  . Stroke Father     deceased  . Dementia Mother     Lives in [place]   the patient's father died at the age of 71 following a stroke. The patient's mother is still living, age 37. GEN had no brothers, one sister. That sister was diagnosed with uterine cancer at the age of  66. There is no other history of breast or ovarian cancer in the family to the patient's knowledge  GYNECOLOGIC HISTORY:  No LMP recorded. Patient has had a hysterectomy.  menarche age 18, first live birth age 15, the patient is [place] P2.  She underwent total abdominal hysterectomy with bilateral salpingo-oophorectomy in 1999. She took hormone replacement for approximately 4 years. 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 Their daughter Luane School lives in [place] where she works as an Web designer. Their son Robert [name] lives in [place] where he works as a Education officer, museum. Herbie Baltimore is expecting twins.    ADVANCED DIRECTIVES:  Not in place. During the 12/03/2014 visited the patient was given the appropriate forms to complete and notarize at her discretion.   HEALTH MAINTENANCE: Social History  Substance Use Topics  .  Smoking status: Never Smoker   . Smokeless tobacco: Never Used  . Alcohol Use: 2.4 oz/week    4 Glasses of wine per week     Colonoscopy:  2008  PAP:  Bone density: 2007/"normal"  Lipid panel:  Allergies  Allergen Reactions  . Lanolin   . Tape Other (See Comments)    Redness (Bandaids also)    Current Outpatient Prescriptions  Medication Sig Dispense Refill  . acetaminophen (TYLENOL) 500 MG tablet Take 500 mg by mouth every 6 (six) hours as needed.    Marland Kitchen aMILoride (MIDAMOR) 5 MG tablet Take 5 mg by mouth daily.    Marland Kitchen anastrozole (ARIMIDEX) 1 MG tablet Take 1 tablet (1 mg total) by mouth daily. 90 tablet 3  . B Complex-C (B-COMPLEX WITH VITAMIN C) tablet Take 1 tablet by mouth daily.    . Cholecalciferol (VITAMIN D3) 2000 UNITS TABS Take 1 tablet by mouth daily.    . hyaluronate sodium (RADIAPLEXRX) GEL Apply 1 application topically 2 (two) times daily.    . hydrochlorothiazide (MICROZIDE)  12.5 MG capsule Take 1 capsule (12.5 mg total) by mouth daily. 30 capsule 5  . ketoconazole (NIZORAL) 2 % shampoo Apply 1 application topically 2 (two) times a week.    . metoprolol succinate (TOPROL-XL) 50 MG 24 hr tablet Take 50 mg by mouth daily. Take with or immediately following a meal.    . non-metallic deodorant (ALRA) MISC Apply 1 application topically daily as needed.    . pantoprazole (PROTONIX) 20 MG tablet Take 20 mg by mouth daily as needed for heartburn.     . simvastatin (ZOCOR) 20 MG tablet Take 20 mg by mouth daily.    . Telmisartan-Amlodipine 80-5 MG TABS Take 1 tablet by mouth daily.  3   No current facility-administered medications for this visit.    OBJECTIVE:  Middle-aged white woman who appears well Filed Vitals:   04/29/15 0914  BP: 106/62  Pulse: 63  Temp: 98.5 F (36.9 C)  Resp: 18     Body mass index is 38.56 kg/(m^2).    ECOG FS:1 - Symptomatic but completely ambulatory  Sclerae unicteric, EOMs intact Oropharynx clear, dentition in good repair No cervical or supraclavicular adenopathy Lungs no rales or rhonchi Heart regular rate and rhythm Abd soft, nontender, positive bowel sounds MSK no focal spinal tenderness, no upper extremity lymphedema Neuro: nonfocal, well oriented, appropriate affect Breasts: Both breasts are status post reduction mammoplasty and the left breast is status post lumpectomy and radiation. There is a little bit of hyper pigmentation remaining in the left breast. There is no evidence of disease recurrence. Both axillae are benign. Obese,   LAB RESULTS:  CMP     Component Value Date/Time   NA 141 12/03/2014 0832   NA 139 11/05/2014 1142   K 3.8 12/03/2014 0832   K 3.7 11/05/2014 1142   CL 102 11/05/2014 1142   CO2 24 12/03/2014 0832   CO2 28 11/05/2014 1142   GLUCOSE 125 12/03/2014 0832   GLUCOSE 98 11/05/2014 1142   BUN 13.4 12/03/2014 0832   BUN 14 11/05/2014 1142   CREATININE 1.0 12/03/2014 0832   CREATININE 0.89  11/05/2014 1142   CALCIUM 9.1 12/03/2014 0832   CALCIUM 9.7 12/03/2014 0832   PROT 7.0 12/03/2014 0832   PROT 6.9 11/05/2014 1142   ALBUMIN 3.9 12/03/2014 0832   ALBUMIN 4.1 11/05/2014 1142   AST 14 12/03/2014 0832   AST 23 11/05/2014 1142   ALT 18 12/03/2014  0832   ALT 31 11/05/2014 1142   ALKPHOS 57 12/03/2014 0832   ALKPHOS 52 11/05/2014 1142   BILITOT 0.51 12/03/2014 0832   BILITOT 0.4 11/05/2014 1142   GFRNONAA 68* 11/05/2014 1142   GFRAA 78* 11/05/2014 1142    INo results found for: SPEP, UPEP  Lab Results  Component Value Date   WBC 3.7* 12/03/2014   NEUTROABS 2.0 12/03/2014   HGB 12.6 12/03/2014   HCT 38.3 12/03/2014   MCV 95.6 12/03/2014   PLT 215 12/03/2014      Chemistry      Component Value Date/Time   NA 141 12/03/2014 0832   NA 139 11/05/2014 1142   K 3.8 12/03/2014 0832   K 3.7 11/05/2014 1142   CL 102 11/05/2014 1142   CO2 24 12/03/2014 0832   CO2 28 11/05/2014 1142   BUN 13.4 12/03/2014 0832   BUN 14 11/05/2014 1142   CREATININE 1.0 12/03/2014 0832   CREATININE 0.89 11/05/2014 1142      Component Value Date/Time   CALCIUM 9.1 12/03/2014 0832   CALCIUM 9.7 12/03/2014 0832   ALKPHOS 57 12/03/2014 0832   ALKPHOS 52 11/05/2014 1142   AST 14 12/03/2014 0832   AST 23 11/05/2014 1142   ALT 18 12/03/2014 0832   ALT 31 11/05/2014 1142   BILITOT 0.51 12/03/2014 0832   BILITOT 0.4 11/05/2014 1142       No results found for: LABCA2  No components found for: LABCA125  No results for input(s): INR in the last 168 hours.  Urinalysis    Component Value Date/Time   COLORURINE YELLOW 11/05/2014 1142   APPEARANCEUR CLEAR 11/05/2014 1142   LABSPEC 1.008 11/05/2014 1142   PHURINE 7.0 11/05/2014 1142   GLUCOSEU NEGATIVE 11/05/2014 1142   HGBUR NEGATIVE 11/05/2014 1142   BILIRUBINUR NEGATIVE 11/05/2014 1142   KETONESUR NEGATIVE 11/05/2014 1142   PROTEINUR NEGATIVE 11/05/2014 1142   UROBILINOGEN 0.2 11/05/2014 1142   NITRITE NEGATIVE 11/05/2014  1142   LEUKOCYTESUR NEGATIVE 11/05/2014 1142    STUDIES: Bone density discussed with patient  ASSESSMENT: 64 y.o. [place], [place] woman status post left breast upper outer quadrant biopsy 09/18/2014 for a clinical T1 cN0 invasive ductal carcinoma, grade 2, estrogen and progesterone receptor positive, HER-2 not amplified, with an MIB-1 of 30%  (1) there is a 5.5 cm area of non-masslike enhancement seen in the left breast with biopsy 10/07/2014 showing atypical ductal hyperplasia  (2) status post left lumpectomy and sentinel lymph node sampling 11/11/2014 for a  pT1c  pN0, stage IA invasive ductal carcinoma, grade 3, repeat HER-2 again negative, with negative margins  (3) an Oncotype DX score of 11 predicts an 8% risk of outside the breast recurrence within 10 years if the patient's only systemic therapy is tamoxifen for 5 years. It also predicts no benefit from chemotherapy  (4) Pathology from right reduction mammoplasty 11/11/2014 unexpectedly showed ductal carcinoma in situ, estrogen receptor 100% positive, progesterone receptor 71% positive  (5) adjuvant radiation completed 01/27/2015  ( 6) anastrozole started June 2016  (a) bone density 02/17/2015 was normal with a T score of -0.9  (7) because of the incomplete resection on the right breast, bilateral yearly breast MRI are recommended, in addition to mammography.  PLAN: Marcie Bal is recovering well from her surgeries and I have reassured her that the shooting pains she has particularly in the left breast at times are very common postoperatively and do not indicate a problem either with the surgery or the cancer.  Hopefully if these fade away with time.  The hot flashes are worsening her insomnia problem. I suggested she consider gabapentin at bedtime. We discussed the possible toxicities side effects and competitions of that agent and I went ahead and wrote her the prescription. She will let us know whether that works her out for  her.  Because we did not do formal surgery of her DCIS in the right breast, which was a surprise finding after reduction mammoplasty, I am setting her up for an MRI of the breast together with bilateral mammography and tomosynthesis in December. She will return to see me shortly thereafter to discuss those results.  Assuming all goes well the overall plan is to continue anastrozole for a minimum of 5 years, more likely a total of 10.  Marland KitchenChauncey Cruel, MD   04/29/2015 9:45 AM Medical Oncology and Hematology [facility] [address] [place], [place] [address] Tel. [phone]    Fax. [phone]

## 2015-04-30 ENCOUNTER — Encounter: Payer: Self-pay | Admitting: Primary Care

## 2015-04-30 ENCOUNTER — Ambulatory Visit (INDEPENDENT_AMBULATORY_CARE_PROVIDER_SITE_OTHER): Payer: Managed Care, Other (non HMO) | Admitting: Primary Care

## 2015-04-30 VITALS — BP 110/70 | HR 80 | Temp 97.5°F | Ht 59.0 in | Wt 191.4 lb

## 2015-04-30 DIAGNOSIS — R197 Diarrhea, unspecified: Secondary | ICD-10-CM

## 2015-04-30 DIAGNOSIS — Z23 Encounter for immunization: Secondary | ICD-10-CM

## 2015-04-30 LAB — CBC WITH DIFFERENTIAL/PLATELET
BASOS PCT: 0.3 % (ref 0.0–3.0)
Basophils Absolute: 0 10*3/uL (ref 0.0–0.1)
EOS ABS: 0.1 10*3/uL (ref 0.0–0.7)
EOS PCT: 2 % (ref 0.0–5.0)
HEMATOCRIT: 39.6 % (ref 36.0–46.0)
Hemoglobin: 13.5 g/dL (ref 12.0–15.0)
LYMPHS PCT: 16.9 % (ref 12.0–46.0)
Lymphs Abs: 0.9 10*3/uL (ref 0.7–4.0)
MCHC: 34.2 g/dL (ref 30.0–36.0)
MCV: 95.8 fl (ref 78.0–100.0)
Monocytes Absolute: 0.4 10*3/uL (ref 0.1–1.0)
Monocytes Relative: 7.1 % (ref 3.0–12.0)
NEUTROS ABS: 4.1 10*3/uL (ref 1.4–7.7)
Neutrophils Relative %: 73.7 % (ref 43.0–77.0)
Platelets: 194 10*3/uL (ref 150.0–400.0)
RBC: 4.13 Mil/uL (ref 3.87–5.11)
RDW: 12.9 % (ref 11.5–15.5)
WBC: 5.6 10*3/uL (ref 4.0–10.5)

## 2015-04-30 LAB — COMPREHENSIVE METABOLIC PANEL
ALBUMIN: 4.4 g/dL (ref 3.5–5.2)
ALT: 21 U/L (ref 0–35)
AST: 16 U/L (ref 0–37)
Alkaline Phosphatase: 50 U/L (ref 39–117)
BUN: 14 mg/dL (ref 6–23)
CALCIUM: 10.1 mg/dL (ref 8.4–10.5)
CHLORIDE: 103 meq/L (ref 96–112)
CO2: 28 mEq/L (ref 19–32)
CREATININE: 0.87 mg/dL (ref 0.40–1.20)
GFR: 69.65 mL/min (ref >60.00)
Glucose, Bld: 128 mg/dL — ABNORMAL HIGH (ref 70–99)
Potassium: 3.9 mEq/L (ref 3.5–5.1)
Sodium: 139 mEq/L (ref 135–145)
Total Bilirubin: 0.5 mg/dL (ref 0.2–1.2)
Total Protein: 7.5 g/dL (ref 6.0–8.3)

## 2015-04-30 MED ORDER — DIPHENOXYLATE-ATROPINE 2.5-0.025 MG PO TABS: 1.0000 | ORAL_TABLET | Freq: Four times a day (QID) | ORAL | Status: DC | PRN

## 2015-04-30 NOTE — Progress Notes (Signed)
Subjective:    Patient ID: Laura Bush, female    DOB: [birth year], 64 y.o.   MRN: [id number]  HPI  [name] is a 64 year old female who presents today with a chief complaint of diarrhea. Her diarrhea has been present since Friday last week. She and her husband ate shrimp last Thursday night and both developed diarrhea the next day. Her husbands diarrhea subsided after 1 day. Her diarrhea has continued and occurs each time she eats. She's taken imodium and has been eating yogurt, toast, and rice. She has had an improvement in her symptoms today with more formed stools, but continues to have stools after each meal. She's been working to stay hydrated.  Review of Systems  Constitutional: Positive for fatigue. Negative for fever and chills.  Gastrointestinal: Positive for diarrhea. Negative for nausea, vomiting, abdominal pain and blood in stool.  Genitourinary: Negative for dysuria.  Musculoskeletal: Positive for myalgias.       Past Medical History  Diagnosis Date  . Hypertension   . Venous insufficiency   . Asthma     in cold weather  . GERD (gastroesophageal reflux disease)   . Microscopic colitis   . Rosacea   . PONV (postoperative nausea and vomiting)   . High cholesterol   . Heart murmur     as a child (has outgrown)  . Pneumonia 2000's X 1  . OSA on CPAP   . Cluster headaches     in her 40's  . Breast cancer of upper-outer quadrant of left female breast 09/22/2014  . 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 Breast cancer 09/18/14    left breast  . Breast cancer 10/07/14    bx left breast  . S/P radiation therapy 12/30/14-01/27/15    left breast 50Gy total dose    Social History   Social History  . Marital Status: Married    Spouse Name: N/A  . Number of Children: N/A  . Years of Education: N/A   Occupational History  . Not on file.   Social History Main Topics  . Smoking status: Never Smoker   . Smokeless tobacco: Never Used  . Alcohol Use: 2.4 oz/week    4 Glasses of wine per week  . Drug Use: No  . Sexual Activity: Not Currently   Other Topics Concern  . Not on file   Social History Narrative   From [place] [employer].   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   2 children, boy and girl   Twin grandchildren on the way.   Quilting, sewing, reading.    Past Surgical History  Procedure Laterality Date  . Cesarean section  1978; 1980  . Tubal ligation  ?1984  . Abdominal hysterectomy  1999  . Knee arthroscopy Bilateral     meniscus repair  . Colonoscopy    . Carpal tunnel release Bilateral     2 surgeries on right, 1 on left  . Forearm fracture surgery Right 2003 X 3  . Partial mastectomy with axillary sentinel lymph node biopsy Left 11/11/2014  . Mastectomy complete / simple w/ sentinel node biopsy Left 11/11/2014  . Tonsillectomy  ~ 1957  . Appendectomy  ~ 2006  . Fracture surgery    . Reduction mammaplasty Bilateral 11/11/2014  . Breast biopsy Left 10/2014; 10/2014  . Squamous cell carcinoma excision  1980's X 1    "nose"  . Partial mastectomy with needle localization and axillary  sentinel lymph node bx Left 11/11/2014    Procedure: LEFT BREAST PARTIAL MASTECTOMY WITH NEEDLE LOCALIZATION TIMES TWO AND LEFT AXILLARY SENTINEL LYMPH NODE Biopsy;  Surgeon: Fanny Skates, MD;  Location: [facility];  Service: General;  Laterality: Left;  . Breast reduction surgery Bilateral 11/11/2014    Procedure: Bilateral Breast Reduction;  Surgeon: Crissie Reese, MD;  Location: [facility];  Service: Plastics;  Laterality: Bilateral;    Family History  Problem Relation Age of Onset  . Uterine cancer Sister 64  . Stroke Father     deceased  . Dementia Mother     Lives in [place]    Allergies  Allergen Reactions  . Lanolin   . Tape Other (See Comments)    Redness (Bandaids also)    Current Outpatient Prescriptions on File Prior to Visit  Medication Sig Dispense Refill  . acetaminophen (TYLENOL) 500 MG tablet Take 500 mg  by mouth every 6 (six) hours as needed.    Marland Kitchen aMILoride (MIDAMOR) 5 MG tablet Take 5 mg by mouth daily.    Marland Kitchen anastrozole (ARIMIDEX) 1 MG tablet Take 1 tablet (1 mg total) by mouth daily. 90 tablet 3  . B Complex-C (B-COMPLEX WITH VITAMIN C) tablet Take 1 tablet by mouth daily.    . Cholecalciferol (VITAMIN D3) 2000 UNITS TABS Take 1 tablet by mouth daily.    Marland Kitchen gabapentin (NEURONTIN) 300 MG capsule Take 1 capsule (300 mg total) by mouth at bedtime. 90 capsule 4  . hydrochlorothiazide (MICROZIDE) 12.5 MG capsule Take 1 capsule (12.5 mg total) by mouth daily. 30 capsule 5  . ketoconazole (NIZORAL) 2 % shampoo Apply 1 application topically 2 (two) times a week.    . Melatonin 3 MG TABS Take by mouth.  0  . metoprolol succinate (TOPROL-XL) 50 MG 24 hr tablet Take 50 mg by mouth daily. Take with or immediately following a meal.    . pantoprazole (PROTONIX) 20 MG tablet Take 20 mg by mouth daily as needed for heartburn.     . simvastatin (ZOCOR) 20 MG tablet Take 20 mg by mouth daily.    . Telmisartan-Amlodipine 80-5 MG TABS Take 1 tablet by mouth daily.  3   No current facility-administered medications on file prior to visit.    BP 110/70 mmHg  Pulse 80  Temp(Src) 97.5 F (36.4 C) (Oral)  Ht '4\' 11"'$  (1.499 m)  Wt 191 lb 6.4 oz (86.818 kg)  BMI 38.64 kg/m2  SpO2 98%    Objective:   Physical Exam  Constitutional: She appears well-nourished.  Cardiovascular: Normal rate and regular rhythm.   Pulmonary/Chest: Effort normal and breath sounds normal.  Abdominal: Soft. Bowel sounds are increased. There is no tenderness.  Skin: Skin is warm and dry.          Assessment & Plan:  Diarrhea:  Present for 6 days, today with more formed stools. No bloody stools, vomiting, fevers. Continues to have diarrhea after each meal. Some relief with imodium, but will have to take the max dose. She appears slightly dehydrated. Exam with hyperactive bowel sounds, otherwise unremarkable. Vitals  stable. RX for lomotil QID PRN. CMP and CBC with diff today. Discussed bland diet, hydration with water and low calorie gatorade.  Follow up if no improvement.

## 2015-04-30 NOTE — Patient Instructions (Signed)
You may take Lomotil every 6 hours as needed for diarrhea/loose stools.  Complete lab work prior to leaving today. I will notify you of your results.  Continue to stay hydrated with water and low calorie gatorade.  Please notify me if you've had no improvement in diarrhea in the next 4 days.  It was a pleasure to see you today!   Food Choices to Help Relieve Diarrhea When you have diarrhea, the foods you eat and your eating habits are very important. Choosing the right foods and drinks can help relieve diarrhea. Also, because diarrhea can last up to 7 days, you need to replace lost fluids and electrolytes (such as sodium, potassium, and chloride) in order to help prevent dehydration.  WHAT GENERAL GUIDELINES DO I NEED TO FOLLOW?  Slowly drink 1 cup (8 oz) of fluid for each episode of diarrhea. If you are getting enough fluid, your urine will be clear or pale yellow.  Eat starchy foods. Some good choices include white rice, white toast, pasta, low-fiber cereal, baked potatoes (without the skin), saltine crackers, and bagels.  Avoid large servings of any cooked vegetables.  Limit fruit to two servings per day. A serving is  cup or 1 small piece.  Choose foods with less than 2 g of fiber per serving.  Limit fats to less than 8 tsp (38 g) per day.  Avoid fried foods.  Eat foods that have probiotics in them. Probiotics can be found in certain dairy products.  Avoid foods and beverages that may increase the speed at which food moves through the stomach and intestines (gastrointestinal tract). Things to avoid include:  High-fiber foods, such as dried fruit, raw fruits and vegetables, nuts, seeds, and whole grain foods.  Spicy foods and high-fat foods.  Foods and beverages sweetened with high-fructose corn syrup, honey, or sugar alcohols such as xylitol, sorbitol, and mannitol. WHAT FOODS ARE RECOMMENDED? Grains White rice. White, [place], or pita breads (fresh or toasted), including  plain rolls, buns, or bagels. White pasta. Saltine, soda, or graham crackers. Pretzels. Low-fiber cereal. Cooked cereals made with water (such as cornmeal, farina, or cream cereals). Plain muffins. Matzo. Melba toast. Zwieback.  Vegetables Potatoes (without the skin). Strained tomato and vegetable juices. Most well-cooked and canned vegetables without seeds. Tender lettuce. Fruits Cooked or canned applesauce, apricots, cherries, fruit cocktail, grapefruit, peaches, pears, or plums. Fresh bananas, apples without skin, cherries, grapes, cantaloupe, grapefruit, peaches, oranges, or plums.  Meat and Other Protein Products Baked or boiled chicken. Eggs. Tofu. Fish. Seafood. Smooth peanut butter. Ground or well-cooked tender beef, ham, veal, lamb, pork, or poultry.  Dairy Plain yogurt, kefir, and unsweetened liquid yogurt. Lactose-free milk, buttermilk, or soy milk. Plain hard cheese. Beverages Sport drinks. Clear broths. Diluted fruit juices (except prune). Regular, caffeine-free sodas such as ginger ale. Water. Decaffeinated teas. Oral rehydration solutions. Sugar-free beverages not sweetened with sugar alcohols. Other Bouillon, broth, or soups made from recommended foods.  The items listed above may not be a complete list of recommended foods or beverages. Contact your dietitian for more options. WHAT FOODS ARE NOT RECOMMENDED? Grains Whole grain, whole wheat, bran, or rye breads, rolls, pastas, crackers, and cereals. Wild or brown rice. Cereals that contain more than 2 g of fiber per serving. Corn tortillas or taco shells. Cooked or dry oatmeal. Granola. Popcorn. Vegetables Raw vegetables. Cabbage, broccoli, Brussels sprouts, artichokes, baked beans, beet greens, corn, kale, legumes, peas, sweet potatoes, and yams. Potato skins. Cooked spinach and cabbage. Fruits Dried fruit,  including raisins and dates. Raw fruits. Stewed or dried prunes. Fresh apples with skin, apricots, mangoes, pears,  raspberries, and strawberries.  Meat and Other Protein Products Chunky peanut butter. Nuts and seeds. Beans and lentils. Berniece Salines.  Dairy High-fat cheeses. Milk, chocolate milk, and beverages made with milk, such as milk shakes. Cream. Ice cream. Sweets and Desserts Sweet rolls, doughnuts, and sweet breads. Pancakes and waffles. Fats and Oils Butter. Cream sauces. Margarine. Salad oils. Plain salad dressings. Olives. Avocados.  Beverages Caffeinated beverages (such as coffee, tea, soda, or energy drinks). Alcoholic beverages. Fruit juices with pulp. Prune juice. Soft drinks sweetened with high-fructose corn syrup or sugar alcohols. Other Coconut. Hot sauce. Chili powder. Mayonnaise. Gravy. Cream-based or milk-based soups.  The items listed above may not be a complete list of foods and beverages to avoid. Contact your dietitian for more information. WHAT SHOULD I DO IF I BECOME DEHYDRATED? Diarrhea can sometimes lead to dehydration. Signs of dehydration include dark urine and dry mouth and skin. If you think you are dehydrated, you should rehydrate with an oral rehydration solution. These solutions can be purchased at pharmacies, retail stores, or online.  Drink -1 cup (120-240 mL) of oral rehydration solution each time you have an episode of diarrhea. If drinking this amount makes your diarrhea worse, try drinking smaller amounts more often. For example, drink 1-3 tsp (5-15 mL) every 5-10 minutes.  A general rule for staying hydrated is to drink 1-2 L of fluid per day. Talk to your health care provider about the specific amount you should be drinking each day. Drink enough fluids to keep your urine clear or pale yellow. Document Released: 10/15/2003 Document Revised: 07/30/2013 Document Reviewed: 06/17/2013 [facility] Patient Information 2015 [place], [place]. This information is not intended to replace advice given to you by your health care provider. Make sure you discuss any questions you have with  your health care provider.

## 2015-05-04 ENCOUNTER — Encounter: Payer: Self-pay | Admitting: Primary Care

## 2015-05-04 ENCOUNTER — Telehealth: Payer: Self-pay | Admitting: *Deleted

## 2015-05-04 NOTE — Telephone Encounter (Signed)
Spoke with patient for a 3 month follow up.  She is doing well from a breast cancer standpoint.  She is having some diarrhea and is following up with her PCP for that.  Encouraged her to call with any needs or concerns.

## 2015-05-07 ENCOUNTER — Other Ambulatory Visit: Payer: Self-pay | Admitting: Primary Care

## 2015-05-07 ENCOUNTER — Encounter: Payer: Self-pay | Admitting: Primary Care

## 2015-05-07 DIAGNOSIS — R197 Diarrhea, unspecified: Secondary | ICD-10-CM

## 2015-05-07 MED ORDER — CIPROFLOXACIN HCL 500 MG PO TABS: 500.0000 mg | ORAL_TABLET | Freq: Two times a day (BID) | ORAL | Status: DC

## 2015-05-08 ENCOUNTER — Encounter: Payer: Self-pay | Admitting: Primary Care

## 2015-05-08 ENCOUNTER — Other Ambulatory Visit: Payer: Self-pay | Admitting: Primary Care

## 2015-05-08 ENCOUNTER — Other Ambulatory Visit (INDEPENDENT_AMBULATORY_CARE_PROVIDER_SITE_OTHER): Payer: Managed Care, Other (non HMO) | Admitting: Primary Care

## 2015-05-08 DIAGNOSIS — R197 Diarrhea, unspecified: Secondary | ICD-10-CM | POA: Diagnosis not present

## 2015-05-08 MED ORDER — CIPROFLOXACIN HCL 500 MG PO TABS: 500.0000 mg | ORAL_TABLET | Freq: Two times a day (BID) | ORAL | Status: DC

## 2015-05-08 NOTE — Addendum Note (Signed)
Addended by: Daralene Milch C on: 05/08/2015 10:08 AM   Modules accepted: Orders

## 2015-05-12 ENCOUNTER — Other Ambulatory Visit: Payer: Self-pay | Admitting: Primary Care

## 2015-05-12 DIAGNOSIS — R197 Diarrhea, unspecified: Secondary | ICD-10-CM

## 2015-05-12 LAB — STOOL CULTURE

## 2015-05-14 ENCOUNTER — Encounter: Payer: Self-pay | Admitting: Gastroenterology

## 2015-05-22 ENCOUNTER — Telehealth: Payer: Self-pay

## 2015-05-22 NOTE — Telephone Encounter (Signed)
Laura Bush attended the [facility] class recently an would like an order sent to [facility] for a Compression Sleeve. This was suggested in the class to use prophylactically.  She is planning to go to [facility] next week to purchase this item.

## 2015-05-26 ENCOUNTER — Other Ambulatory Visit: Payer: Self-pay

## 2015-05-26 ENCOUNTER — Telehealth: Payer: Self-pay

## 2015-05-26 DIAGNOSIS — C50912 Malignant neoplasm of unspecified site of left female breast: Principal | ICD-10-CM

## 2015-05-26 DIAGNOSIS — C50911 Malignant neoplasm of unspecified site of right female breast: Secondary | ICD-10-CM

## 2015-05-26 MED ORDER — UNABLE TO FIND: Status: DC

## 2015-05-26 NOTE — Telephone Encounter (Signed)
Patient called requesting a prescription for a compression sleeve to be faxed to [facility].  Signed prescription sent. Patient also wanted Dr. Jana Hakim to be aware that 6 weeks after starting arimidex she was having diarrhea after every meal.  The diarrhea she states is watery and sometimes she has to use the BR very urgently.  Patient has a history of "microscopic colitis".  She has been in touch with her PCP who tested her for C-diff which was negative, she was on Cipro which did not make a difference and has tried lomotil as well as imodium. Patient to see GI tomorrow to r/o colitis.  She will update [place] after the visit.

## 2015-05-26 NOTE — Telephone Encounter (Signed)
Patient called requesting the name of the vitamin Dr. Jana Hakim recommends for hair growth.  Writer instructed her that Biotin is the vitamin that is recommended.  Patient stated understanding.

## 2015-05-26 NOTE — Telephone Encounter (Addendum)
Voicemail from patient in regards to "message left last week for an order to vendor "[facility]" (Fax: [phone]) for a compression sleeve.  They have not received order yet.  Secondly, what type of pain relief medicine can I take for pain currently on anastrozole."  Began to ask but ended call stating she will call back about pain relief.    1534: "Calling back about bone and joint pain.  What OTC medicines do you recommend.  I hurt at shoulders, hips and knees.  Return number [phone]."

## 2015-05-27 ENCOUNTER — Ambulatory Visit (INDEPENDENT_AMBULATORY_CARE_PROVIDER_SITE_OTHER): Payer: Managed Care, Other (non HMO) | Admitting: Gastroenterology

## 2015-05-27 ENCOUNTER — Other Ambulatory Visit: Payer: Managed Care, Other (non HMO)

## 2015-05-27 ENCOUNTER — Encounter: Payer: Self-pay | Admitting: Gastroenterology

## 2015-05-27 ENCOUNTER — Encounter: Payer: Self-pay | Admitting: Oncology

## 2015-05-27 VITALS — BP 120/66 | HR 76 | Ht 59.0 in | Wt 190.0 lb

## 2015-05-27 DIAGNOSIS — R197 Diarrhea, unspecified: Secondary | ICD-10-CM | POA: Insufficient documentation

## 2015-05-27 MED ORDER — BUDESONIDE 3 MG PO CPEP: ORAL_CAPSULE | ORAL | Status: DC

## 2015-05-27 NOTE — Progress Notes (Signed)
05/27/2015 Laura Bush [id number] 12/09/1950   HISTORY OF PRESENT ILLNESS:  This is a pleasant 64 year old female who is new to our practice, referred by Alma Friendly, NP. She moved here from [place] in December 2015 and is now establishing GI care in this area. She had previous care [facility] [place]. She tells me that she had a colonoscopy in 2003 from which she recalls was unremarkable. She had another colonoscopy in 2008 for evaluation of diarrhea at which time she was diagnosed with microscopic colitis. She was treated with Entocort for probably a course of a few months. She developed some oral thrush with that for which she used some nystatin mouthwash and that resolved as well. Her diarrhea resolved and she has not had a recurrence of her symptoms until now. She tells me that for the past 3-4 weeks she is having diarrhea daily, about 3-4 bowel movements a day. She does not wake up at night to have bowel movements, but it does usually wake her first thing in the morning. Definitely has bowel movement shortly after eating. She denies seeing any blood in her stool. She denies any abdominal pain. She saw her PCP who performed a CBC and CMP, which were within normal limits. Stool culture was negative, but no C. difficile was ordered. She does not recall being on any antibiotics any time prior to the start of this diarrhea. She did have a diagnosis of breast cancer this year and had surgery and radiation for that. She is now on Arimidex as well.  Otherwise she denies any new medications.   Past Medical History  Diagnosis Date  . Hypertension   . Venous insufficiency   . Asthma     in cold weather  . GERD (gastroesophageal reflux disease)   . Microscopic colitis   . Rosacea   . PONV (postoperative nausea and vomiting)   . High cholesterol   . Heart murmur     as a child (has outgrown)  . Pneumonia 2000's X 1  . OSA on CPAP   . Cluster headaches     in her 40's  .  Breast cancer of upper-outer quadrant of left female breast ([place]) 09/22/2014  . 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 Breast cancer ([place]) 09/18/14    left breast  . Breast cancer ([place]) 10/07/14    bx left breast  . S/P radiation therapy 12/30/14-01/27/15    left breast 50Gy total dose   Past Surgical History  Procedure Laterality Date  . Cesarean section  1978; 1980  . Tubal ligation  ?1984  . Abdominal hysterectomy  1999  . Knee arthroscopy Bilateral     meniscus repair  . Colonoscopy    . Carpal tunnel release Bilateral     2 surgeries on right, 1 on left  . Forearm fracture surgery Right 2003 X 3  . Partial mastectomy with axillary sentinel lymph node biopsy Left 11/11/2014  . Mastectomy complete / simple w/ sentinel node biopsy Left 11/11/2014  . Tonsillectomy  ~ 1957  . Appendectomy  ~ 2006  . Fracture surgery    . Reduction mammaplasty Bilateral 11/11/2014  . Breast biopsy Left 10/2014; 10/2014  . Squamous cell carcinoma excision  1980's X 1    "nose"  . Partial mastectomy with needle localization and axillary sentinel lymph node bx Left 11/11/2014    Procedure: LEFT BREAST PARTIAL MASTECTOMY WITH NEEDLE LOCALIZATION TIMES TWO AND LEFT AXILLARY SENTINEL LYMPH NODE  Biopsy;  Surgeon: Fanny Skates, MD;  Location: [facility];  Service: General;  Laterality: Left;  . Breast reduction surgery Bilateral 11/11/2014    Procedure: Bilateral Breast Reduction;  Surgeon: Crissie Reese, MD;  Location: [facility];  Service: Plastics;  Laterality: Bilateral;    reports that she has never smoked. She has never used smokeless tobacco. She reports that she drinks about 2.4 oz of alcohol per week. She reports that she does not use illicit drugs. family history includes Dementia in her mother; Stroke in her father; Uterine cancer (age of onset: 8) in her sister. Allergies  Allergen Reactions  . Lanolin   . Tape Other (See Comments)    Redness (Bandaids also)      Outpatient Encounter Prescriptions as of  05/27/2015  Medication Sig  . acetaminophen (TYLENOL) 500 MG tablet Take 500 mg by mouth every 6 (six) hours as needed.  Marland Kitchen aMILoride (MIDAMOR) 5 MG tablet Take 5 mg by mouth daily.  Marland Kitchen anastrozole (ARIMIDEX) 1 MG tablet Take 1 tablet (1 mg total) by mouth daily.  . B Complex-C (B-COMPLEX WITH VITAMIN C) tablet Take 1 tablet by mouth daily.  . Cholecalciferol (VITAMIN D3) 2000 UNITS TABS Take 1 tablet by mouth daily.  Marland Kitchen gabapentin (NEURONTIN) 300 MG capsule Take 1 capsule (300 mg total) by mouth at bedtime.  . hydrochlorothiazide (MICROZIDE) 12.5 MG capsule Take 1 capsule (12.5 mg total) by mouth daily.  Marland Kitchen ketoconazole (NIZORAL) 2 % shampoo Apply 1 application topically 2 (two) times a week.  . Melatonin 3 MG TABS Take by mouth.  . metoprolol succinate (TOPROL-XL) 50 MG 24 hr tablet Take 50 mg by mouth daily. Take with or immediately following a meal.  . pantoprazole (PROTONIX) 20 MG tablet Take 20 mg by mouth daily as needed for heartburn.   . Probiotic Product (PROBIOTIC ADVANCED) CAPS Take 1 capsule by mouth daily.  . simvastatin (ZOCOR) 20 MG tablet Take 20 mg by mouth daily.  . Telmisartan-Amlodipine 80-5 MG TABS Take 1 tablet by mouth daily.  Marland Kitchen UNABLE TO FIND Please fit patient for a compression sleeve.  Diagnosis:  Bilateral breast cancer  . budesonide (ENTOCORT EC) 3 MG 24 hr capsule Take 3 tablets daily.  . [DISCONTINUED] ciprofloxacin (CIPRO) 500 MG tablet Take 1 tablet (500 mg total) by mouth 2 (two) times daily.  . [DISCONTINUED] diphenoxylate-atropine (LOMOTIL) 2.5-0.025 MG per tablet Take 1 tablet by mouth 4 (four) times daily as needed for diarrhea or loose stools.   No facility-administered encounter medications on file as of 05/27/2015.     REVIEW OF SYSTEMS  : All other systems reviewed and negative except where noted in the History of Present Illness.   PHYSICAL EXAM: BP 120/66 mmHg  Pulse 76  Ht '4\' 11"'$  (1.499 m)  Wt 190 lb (86.183 kg)  BMI 38.35 kg/m2 General:  Well developed white female in no acute distress Head: Normocephalic and atraumatic Eyes:  Sclerae anicteric, conjunctiva pink. Ears: Normal auditory acuity Lungs: Clear throughout to auscultation Heart: Regular rate and rhythm Abdomen: Soft, non-distended.  Normal bowel sounds.  Non-tender. Musculoskeletal: Symmetrical with no gross deformities  Skin: No lesions on visible extremities Extremities: No edema  Neurological: Alert oriented x 4, grossly non-focal Psychological:  Alert and cooperative. Normal mood and affect  ASSESSMENT AND PLAN: -Diarrhea:  Has previous history of microscopic colitis in 2008.  Symptoms are similar as to when she had that in the past.  Will check stool for Cdiff, but as long as  that was negative then we will start her on budesonide 9 mg daily.  Also, we will be obtaining her colonoscopy records from [facility].  CC:  Pleas Koch, NP

## 2015-05-27 NOTE — Patient Instructions (Signed)
Please go to the basement level to our lab for a stool test.  We sent a prescription to [facility] for Budesonide ( Entocort ) 3 mg.  Taje 9 mg daily ( 3 tablets ).

## 2015-05-27 NOTE — Progress Notes (Signed)
Agree with initial assessment and plans. Please obtained colonoscopy and pathology reports from the [facility]. Thank you

## 2015-05-28 ENCOUNTER — Other Ambulatory Visit: Payer: Self-pay | Admitting: Oncology

## 2015-05-28 ENCOUNTER — Telehealth: Payer: Self-pay | Admitting: Oncology

## 2015-05-28 ENCOUNTER — Other Ambulatory Visit: Payer: Self-pay | Admitting: *Deleted

## 2015-05-28 ENCOUNTER — Other Ambulatory Visit: Payer: Managed Care, Other (non HMO)

## 2015-05-28 ENCOUNTER — Telehealth: Payer: Self-pay

## 2015-05-28 DIAGNOSIS — R197 Diarrhea, unspecified: Secondary | ICD-10-CM

## 2015-05-28 NOTE — Telephone Encounter (Signed)
Called patient with December appointment

## 2015-05-28 NOTE — Telephone Encounter (Signed)
Per Dr. Jana Hakim, " Please instruct patient to stop anastrozole. I have placed POF for appt sometinme in DEC tio discuss alternatives".  Writer spoke with patient and passed this message on to her.  She states understanding and will wait for schedulers to call her with the December appt.

## 2015-05-28 NOTE — Telephone Encounter (Signed)
Please instruct patient to stop anastrozole. I have placed POF for appt sometinme in [place] tio discuss alternatives  Confirm order for sleeve is in place  Thanks!

## 2015-05-29 LAB — CLOSTRIDIUM DIFFICILE BY PCR: CDIFFPCR: NOT DETECTED

## 2015-07-14 ENCOUNTER — Ambulatory Visit: Payer: Managed Care, Other (non HMO) | Admitting: Internal Medicine

## 2015-07-14 ENCOUNTER — Ambulatory Visit (HOSPITAL_BASED_OUTPATIENT_CLINIC_OR_DEPARTMENT_OTHER): Payer: Managed Care, Other (non HMO) | Admitting: Oncology

## 2015-07-14 ENCOUNTER — Encounter: Payer: Self-pay | Admitting: Oncology

## 2015-07-14 VITALS — BP 129/60 | HR 55 | Temp 97.7°F | Resp 20 | Ht 59.0 in | Wt 188.2 lb

## 2015-07-14 DIAGNOSIS — D0511 Intraductal carcinoma in situ of right breast: Secondary | ICD-10-CM

## 2015-07-14 DIAGNOSIS — C50412 Malignant neoplasm of upper-outer quadrant of left female breast: Secondary | ICD-10-CM

## 2015-07-14 DIAGNOSIS — C50811 Malignant neoplasm of overlapping sites of right female breast: Secondary | ICD-10-CM

## 2015-07-14 DIAGNOSIS — C50812 Malignant neoplasm of overlapping sites of left female breast: Secondary | ICD-10-CM

## 2015-07-14 DIAGNOSIS — Z170 Estrogen receptor positive status [ER+]: Secondary | ICD-10-CM

## 2015-07-14 DIAGNOSIS — N6092 Unspecified benign mammary dysplasia of left breast: Secondary | ICD-10-CM

## 2015-07-14 MED ORDER — TAMOXIFEN CITRATE 20 MG PO TABS: 20.0000 mg | ORAL_TABLET | Freq: Every day | ORAL | Status: DC

## 2015-07-14 NOTE — Progress Notes (Signed)
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 While still there, she had routine screening mammography with tomography 02/14/2014, which showed only a tiny breast cysts. Six-month follow-up exam was performed 08/25/2014, and now there was evidence of a possible focal asymmetry in the left breast. On 08/25/2014 at the [place] of radiology she underwent a unilateral diagnostic mammography with tomography , and this confirmed a spiculated mass in the left breast superiorly. Sonography of this area found a poorly defined hypoechoic mass measuring 0.7 cm at the 11:00 position 8 cm from the nipple. There was also an adjacent cluster of cysts with a benign circumcised appearance.   Biopsy of the mass in question was recommended, but as the patient was moving to this area, this was performed here on 09/18/2014. The pathology from that procedure ([id number] [id number]) found an invasive ductal carcinoma, grade 2, estrogen receptor 100% positive, with strong staining intensity, progesterone receptor 43% positive, with strong staining intensity, with an MIB-1 of 30%, and no HER-2  amplification.    on 09/26/2014 the patient underwent bilateral breast MRI at [facility] imaging, showing a breast density category B. The right breast was unremarkable and there were no lymph nodes of concern. In the upper inner quadrant of the left breast there was an enhancing mass measuring 1.2 cm. In addition there was a 5.5 cm area of linear enhancement extending anteriorly from this mass. 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   The patient's subsequent history is as detailed below  INTERVAL HISTORY:  Laura Bush returns today for follow-up of her breast cancers. She was started on anastrozole in June of this year, but stopped it in October per Dr. Virgie Dad request. She was experiencing extraordinary joint pain and cognitive changes. She complained of confusion and lack of short term memory. Since stopping this drug these symptoms have ceased. She stopped the gabapentin because she was no longer having hot flashes, but she admits it helped her sleep better.   REVIEW OF SYSTEMS: Laura Bush denies fevers, chills, nausea or vomiting. She is on Entocort for her history of watery diarrhea as prescribed by [facility]. Her stools are soft now, though she has 3-4 daily. She walks for exercise. She denies pain beyond the shooting sensations in her breast. She has no shortness of breath, chest pain, cough, or palpitations. She does not sleep well. A detailed review of systems is otherwise stable.  PAST MEDICAL HISTORY: Past Medical History  Diagnosis Date  . Hypertension   . Venous insufficiency   . Asthma     in cold weather  . GERD (gastroesophageal reflux disease)   . Microscopic colitis   . Rosacea   . PONV (postoperative nausea and vomiting)   . High cholesterol   .  Heart murmur     as a child (has outgrown)  . Pneumonia 2000's X 1  . OSA on CPAP   . Cluster headaches     in her 40's  . Breast cancer of upper-outer quadrant of left female breast ([place]) 09/22/2014  . 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 Breast cancer ([place]) 09/18/14    left breast  . Breast cancer ([place]) 10/07/14    bx left breast  . S/P radiation therapy 12/30/14-01/27/15    left breast 50Gy total dose    PAST SURGICAL HISTORY: Past Surgical History  Procedure Laterality Date  . Cesarean section  1978; 1980  . Tubal ligation  ?1984  . Abdominal hysterectomy  1999  . Knee arthroscopy Bilateral     meniscus repair  . Colonoscopy    . Carpal tunnel release Bilateral     2 surgeries on right, 1 on left  . Forearm fracture surgery Right 2003 X 3  . Partial mastectomy with axillary sentinel lymph node biopsy Left 11/11/2014  . Mastectomy complete / simple w/ sentinel node biopsy Left 11/11/2014  . Tonsillectomy  ~ 1957  . Appendectomy  ~ 2006  . Fracture surgery    . Reduction mammaplasty Bilateral 11/11/2014  . Breast biopsy Left 10/2014; 10/2014  . Squamous cell carcinoma excision  1980's X 1    "nose"  . Partial mastectomy with needle localization and axillary sentinel lymph node bx Left 11/11/2014    Procedure: LEFT BREAST PARTIAL MASTECTOMY WITH NEEDLE LOCALIZATION TIMES TWO AND LEFT AXILLARY SENTINEL LYMPH NODE Biopsy;  Surgeon: Fanny Skates, MD;  Location: [place];  Service: General;  Laterality: Left;  . Breast reduction surgery Bilateral 11/11/2014    Procedure: Bilateral Breast Reduction;  Surgeon: Crissie Reese, MD;  Location: [place];  Service: Plastics;  Laterality: Bilateral;    FAMILY HISTORY Family History  Problem Relation Age of Onset  . Uterine cancer Sister 88  . Stroke Father     deceased  . Dementia Mother     Lives in [place]   the patient's father died at the age of 65 following a stroke. The patient's mother is still living, age 48. GEN had no brothers, one sister. That sister was diagnosed with uterine cancer at the age of 77. There is no other history of breast or ovarian cancer in the family to the patient's knowledge  GYNECOLOGIC HISTORY:  No LMP recorded.  Patient has had a hysterectomy.  menarche age 39, first live birth age 17, the patient is [place] P2.  She underwent total abdominal hysterectomy with bilateral salpingo-oophorectomy in 1999. She took hormone replacement for approximately 4 years. 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 Their daughter Laura Bush lives in [place] where she works as an Web designer. Their son Laura Bush lives in [place] where he works as a Education officer, museum. Laura Bush is expecting twins.    ADVANCED DIRECTIVES:  Not in place. During the 12/03/2014 visited the patient was given the appropriate forms to complete and notarize at her discretion.   HEALTH MAINTENANCE: Social History  Substance Use Topics  . Smoking status: Never Smoker   . Smokeless tobacco: Never Used  . Alcohol Use: 2.4 oz/week    4 Glasses of wine per week     Colonoscopy:  2008  PAP:  Bone density: 2007/"normal"  Lipid panel:  Allergies  Allergen Reactions  . Lanolin   . Tape Other (See Comments)    Redness (Bandaids also)    Current Outpatient Prescriptions  Medication Sig Dispense Refill  . aMILoride (MIDAMOR) 5 MG tablet Take 5 mg by mouth daily.    . B Complex-C (B-COMPLEX WITH VITAMIN C) tablet Take 1 tablet by mouth daily.    . budesonide (ENTOCORT EC) 3 MG 24 hr capsule Take 3 tablets daily. 90 capsule 2  . Cholecalciferol (VITAMIN D3) 2000 UNITS TABS Take 1 tablet by mouth daily.    . hydrochlorothiazide (MICROZIDE) 12.5 MG capsule Take 1 capsule (12.5 mg total) by mouth daily. 30 capsule 5  . Melatonin 3 MG TABS Take by mouth.  0  . metoprolol succinate (TOPROL-XL) 50 MG 24 hr tablet Take 50 mg by mouth daily. Take with or immediately following a meal.    . Probiotic Product (PROBIOTIC ADVANCED) CAPS Take 1 capsule by mouth daily.    . simvastatin  (ZOCOR) 20 MG tablet Take 20 mg by mouth daily.    . Telmisartan-Amlodipine 80-5 MG TABS Take 1 tablet by mouth daily.  3  . UNABLE TO FIND Please fit patient for a compression sleeve.  Diagnosis:  Bilateral breast cancer 1 each 0  . acetaminophen (TYLENOL) 500 MG tablet Take 500 mg by mouth every 6 (six) hours as needed.    . gabapentin (NEURONTIN) 300 MG capsule Take 1 capsule (300 mg total) by mouth at bedtime. (Patient not taking: Reported on 07/14/2015) 90 capsule 4  . ketoconazole (NIZORAL) 2 % shampoo Apply 1 application topically 2 (two) times a week.    . pantoprazole (PROTONIX) 20 MG tablet Take 20 mg by mouth daily as needed for heartburn.     . tamoxifen (NOLVADEX) 20 MG tablet Take 1 tablet (20 mg total) by mouth daily. 90 tablet 2   No current facility-administered medications for this visit.    OBJECTIVE:  Middle-aged white woman who appears well Filed Vitals:   07/14/15 1018  BP: 129/60  Pulse: 55  Temp: 97.7 F (36.5 C)  Resp: 20     Body mass index is 37.99 kg/(m^2).    ECOG FS:1 - Symptomatic but completely ambulatory  Skin: warm, dry  HEENT: sclerae anicteric, conjunctivae pink, oropharynx clear. No thrush or mucositis.  Lymph Nodes: No cervical or supraclavicular lymphadenopathy  Lungs: clear to auscultation bilaterally, no rales, wheezes, or rhonci  Heart: regular rate and rhythm  Abdomen: round, soft, non tender, positive bowel sounds  Musculoskeletal: No focal spinal tenderness, no peripheral edema  Neuro: non focal, well oriented, positive affect  Breasts: deferred   LAB RESULTS:  CMP     Component Value Date/Time   NA 139 04/30/2015 1424   NA 141 12/03/2014 0832   K 3.9 04/30/2015 1424   K 3.8 12/03/2014 0832   CL 103 04/30/2015 1424   CO2 28 04/30/2015 1424   CO2 24 12/03/2014 0832   GLUCOSE 128* 04/30/2015 1424   GLUCOSE 125 12/03/2014 0832   BUN 14 04/30/2015 1424   BUN 13.4 12/03/2014 0832   CREATININE 0.87 04/30/2015 1424   CREATININE  1.0 12/03/2014 0832   CALCIUM 10.1 04/30/2015 1424   CALCIUM 9.7 12/03/2014 0832   PROT 7.5 04/30/2015 1424   PROT 7.0 12/03/2014 0832   ALBUMIN 4.4 04/30/2015 1424   ALBUMIN 3.9 12/03/2014 0832   AST 16 04/30/2015 1424   AST 14 12/03/2014 0832  ALT 21 04/30/2015 1424   ALT 18 12/03/2014 0832   ALKPHOS 50 04/30/2015 1424   ALKPHOS 57 12/03/2014 0832   BILITOT 0.5 04/30/2015 1424   BILITOT 0.51 12/03/2014 0832   GFRNONAA 68* 11/05/2014 1142   GFRAA 78* 11/05/2014 1142    INo results found for: SPEP, UPEP  Lab Results  Component Value Date   WBC 5.6 04/30/2015   NEUTROABS 4.1 04/30/2015   HGB 13.5 04/30/2015   HCT 39.6 04/30/2015   MCV 95.8 04/30/2015   PLT 194.0 04/30/2015      Chemistry      Component Value Date/Time   NA 139 04/30/2015 1424   NA 141 12/03/2014 0832   K 3.9 04/30/2015 1424   K 3.8 12/03/2014 0832   CL 103 04/30/2015 1424   CO2 28 04/30/2015 1424   CO2 24 12/03/2014 0832   BUN 14 04/30/2015 1424   BUN 13.4 12/03/2014 0832   CREATININE 0.87 04/30/2015 1424   CREATININE 1.0 12/03/2014 0832      Component Value Date/Time   CALCIUM 10.1 04/30/2015 1424   CALCIUM 9.7 12/03/2014 0832   ALKPHOS 50 04/30/2015 1424   ALKPHOS 57 12/03/2014 0832   AST 16 04/30/2015 1424   AST 14 12/03/2014 0832   ALT 21 04/30/2015 1424   ALT 18 12/03/2014 0832   BILITOT 0.5 04/30/2015 1424   BILITOT 0.51 12/03/2014 0832       No results found for: LABCA2  No components found for: LABCA125  No results for input(s): INR in the last 168 hours.  Urinalysis    Component Value Date/Time   COLORURINE YELLOW 11/05/2014 1142   APPEARANCEUR CLEAR 11/05/2014 1142   LABSPEC 1.008 11/05/2014 1142   PHURINE 7.0 11/05/2014 1142   GLUCOSEU NEGATIVE 11/05/2014 1142   HGBUR NEGATIVE 11/05/2014 1142   BILIRUBINUR NEGATIVE 11/05/2014 1142   KETONESUR NEGATIVE 11/05/2014 1142   PROTEINUR NEGATIVE 11/05/2014 1142   UROBILINOGEN 0.2 11/05/2014 1142   NITRITE NEGATIVE  11/05/2014 1142   LEUKOCYTESUR NEGATIVE 11/05/2014 1142    STUDIES: No results found.  ASSESSMENT: 64 y.o. [place], [place] woman status post left breast upper outer quadrant biopsy 09/18/2014 for a clinical T1 cN0 invasive ductal carcinoma, grade 2, estrogen and progesterone receptor positive, HER-2 not amplified, with an MIB-1 of 30%  (1) there is a 5.5 cm area of non-masslike enhancement seen in the left breast with biopsy 10/07/2014 showing atypical ductal hyperplasia  (2) status post left lumpectomy and sentinel lymph node sampling 11/11/2014 for a  pT1c  pN0, stage IA invasive ductal carcinoma, grade 3, repeat HER-2 again negative, with negative margins  (3) an Oncotype DX score of 11 predicts an 8% risk of outside the breast recurrence within 10 years if the patient's only systemic therapy is tamoxifen for 5 years. It also predicts no benefit from chemotherapy  (4) Pathology from right reduction mammoplasty 11/11/2014 unexpectedly showed ductal carcinoma in situ, estrogen receptor 100% positive, progesterone receptor 71% positive  (5) adjuvant radiation completed 01/27/2015  (6) anastrozole started June 2016, stopped 05/28/15 due to intolerance  (a) bone density 02/17/2015 was normal with a T score of -0.9  (7) tamoxifen started 07/14/15  (8) because of the incomplete resection on the right breast, bilateral yearly breast MRI are recommended, in addition to mammography.    PLAN: Laura Bush was unable to tolerate anastrozole, but is very interested in tamoxifen, which a few of her friends in the [facility] support group are on. We reviewed potential side effects  such as hot flashes and vaginal wetness, which are the most common. She has no history of DVT or cataracts, but she is aware of the potential for blood clots or increased speed of cataract growth if they were ever to occur. She has had a hysterectomy so the potential for uterine polyps does not apply to her. She would like to start this  drug immediately. It is generic and should cost her no more than $10-12 monthly.   She has decided to restart her gabapentin, because it did help her to sleep, and she hopes it will ward off hot flashes from the tamoxifen.   Laura Bush is already scheduled for a breast MRI and mammogram in January. She will return for her previously scheduled follow up visit with Dr. Jana Hakim in February. She understands and agrees with this plan. She knows the goal of treatment in her case is cure. She has been encouraged to call with any issues that might arise before her next visit here.   Laurie Panda, NP   07/14/2015 11:34 AM

## 2015-07-17 ENCOUNTER — Telehealth: Payer: Self-pay | Admitting: Gastroenterology

## 2015-07-17 MED ORDER — FLUCONAZOLE 100 MG PO TABS
100.0000 mg | ORAL_TABLET | Freq: Every day | ORAL | Status: DC
Start: 2015-07-17 — End: 2015-08-17

## 2015-07-17 NOTE — Telephone Encounter (Signed)
Patient to hold Zocor for the 10 days per Alonza Bogus, PA. Rx sent to pharmacy. Patient notified of recommendations.

## 2015-07-17 NOTE — Telephone Encounter (Signed)
Please prescribe fluconazole 100 mg daily x 10 days.  Thank you,  Jess

## 2015-07-17 NOTE — Telephone Encounter (Signed)
Patient states she started having tingling and salty tongue earlier in week. Yesterday, she had a white coating on tongue and mouth is sore. States she had thrush when taking Entocort in past. Pharmacy- [facility] [name] [place]. Please, advise.

## 2015-08-17 ENCOUNTER — Ambulatory Visit (INDEPENDENT_AMBULATORY_CARE_PROVIDER_SITE_OTHER): Payer: Managed Care, Other (non HMO) | Admitting: Internal Medicine

## 2015-08-17 ENCOUNTER — Encounter: Payer: Self-pay | Admitting: Internal Medicine

## 2015-08-17 VITALS — BP 122/78 | HR 68 | Ht 59.0 in | Wt 193.6 lb

## 2015-08-17 DIAGNOSIS — R197 Diarrhea, unspecified: Secondary | ICD-10-CM | POA: Diagnosis not present

## 2015-08-17 DIAGNOSIS — K52832 Lymphocytic colitis: Secondary | ICD-10-CM | POA: Diagnosis not present

## 2015-08-17 MED ORDER — BUDESONIDE 3 MG PO CPEP: ORAL_CAPSULE | ORAL | Status: DC

## 2015-08-17 NOTE — Progress Notes (Signed)
HISTORY OF PRESENT ILLNESS:  Laura Bush is a 65 y.o. female with past medical history as listed below who was evaluated by the GI physician assistant, as a new patient, 05/27/2015 regarding a one-month history of daily diarrhea, generally 3-4 loose bowel movements daily and after each meal. She does have a history of lymphocytic colitis diagnosed in 2008 at [facility] (colonoscopy report and biopsies reviewed). At that time she was treated with budesonide for several months with resolution of symptoms. No recurrence after medication taper until recent symptoms. Stool pathogen panel recently was negative as was Clostridium difficile. Blood work including CBC with differential and cooperative metabolic panel were unremarkable. She was prescribed budesonide 9 mg daily and this follow-up arranged. Since initiating therapy, she does report significant improvement in her bowel habits. Currently with one to 2 soft bowel movements per day. Not quite her baseline of one normal formed bowel movement daily. She does report having some transient thrush for which she took Diflucan. No other issues. She is pleased.  REVIEW OF SYSTEMS:  All non-GI ROS negative except for  Past Medical History  Diagnosis Date  . Hypertension   . Venous insufficiency   . Asthma     in cold weather  . GERD (gastroesophageal reflux disease)   . Microscopic colitis   . Rosacea   . PONV (postoperative nausea and vomiting)   . High cholesterol   . Heart murmur     as a child (has outgrown)  . Pneumonia 2000's X 1  . OSA on CPAP   . Cluster headaches     in her 40's  . Breast cancer of upper-outer quadrant of left female breast ([place]) 09/22/2014  . Squamous carcinoma ([place]) 1980's    "nose"  . Breast cancer ([place]) 09/18/14    left breast  . Breast cancer ([place]) 10/07/14    bx left breast  . S/P radiation therapy 12/30/14-01/27/15    left breast 50Gy total dose    Past Surgical History  Procedure Laterality  Date  . Cesarean section  1978; 1980  . Tubal ligation  ?1984  . Abdominal hysterectomy  1999  . Knee arthroscopy Bilateral     meniscus repair  . Colonoscopy    . Carpal tunnel release Bilateral     2 surgeries on right, 1 on left  . Forearm fracture surgery Right 2003 X 3  . Partial mastectomy with axillary sentinel lymph node biopsy Left 11/11/2014  . Mastectomy complete / simple w/ sentinel node biopsy Left 11/11/2014  . Tonsillectomy  ~ 1957  . Appendectomy  ~ 2006  . Fracture surgery    . Reduction mammaplasty Bilateral 11/11/2014  . Breast biopsy Left 10/2014; 10/2014  . Squamous cell carcinoma excision  1980's X 1    "nose"  . Partial mastectomy with needle localization and axillary sentinel lymph node bx Left 11/11/2014    Procedure: LEFT BREAST PARTIAL MASTECTOMY WITH NEEDLE LOCALIZATION TIMES TWO AND LEFT AXILLARY SENTINEL LYMPH NODE Biopsy;  Surgeon: Fanny Skates, MD;  Location: [place];  Service: General;  Laterality: Left;  . Breast reduction surgery Bilateral 11/11/2014    Procedure: Bilateral Breast Reduction;  Surgeon: Crissie Reese, MD;  Location: [place];  Service: Plastics;  Laterality: Bilateral;    Social History Tove W Bush  reports that she has never smoked. She has never used smokeless tobacco. She reports that she drinks about 2.4 oz of alcohol per week. She reports that she does not use illicit  drugs.  family history includes Dementia in her mother; Stroke in her father; Uterine cancer (age of onset: 53) in her sister.  Allergies  Allergen Reactions  . Lanolin   . Tape Other (See Comments)    Redness (Bandaids also)       PHYSICAL EXAMINATION: Vital signs: BP 122/78 mmHg  Pulse 68  Ht '4\' 11"'$  (1.499 m)  Wt 193 lb 9.6 oz (87.816 kg)  BMI 39.08 kg/m2 General: Well-developed, well-nourished, no acute distress HEENT: Sclerae are anicteric, conjunctiva pink. Oral mucosa intact Lungs: Clear Heart: Regular Abdomen: soft, obese, nontender, nondistended, no  obvious ascites, no peritoneal signs, normal bowel sounds. No organomegaly. Extremities: No cyanosis or edema Psychiatric: alert and oriented x3. Cooperative   ASSESSMENT:  #1. Recent problems with diarrhea #2. History of lymphocytic colitis. Likely cause for recent issues with diarrhea. Symptoms improved on budesonide for about 10 weeks now   PLAN:  #1. Continue budesonide 9 mg daily for 4 weeks, then 6 mg daily for 4 weeks, then 3 mg daily for 4 weeks, then stop #2. Contact the office should symptoms recur. Otherwise, routine follow-up in one year #3. Routine screening colonoscopy around 2018 with the appropriate

## 2015-08-17 NOTE — Patient Instructions (Signed)
Continue taking your Budesonide as follows:  '9mg'$  daily for one month, then '6mg'$  daily for one month, then 3 mg daily for one month and then stop  Please follow up in one year

## 2015-08-27 ENCOUNTER — Ambulatory Visit
Admission: RE | Admit: 2015-08-27 | Discharge: 2015-08-27 | Disposition: A | Discharge status: Home / Self Care | Payer: Managed Care, Other (non HMO) | Source: Ambulatory Visit | Attending: Oncology | Admitting: Oncology

## 2015-08-27 ENCOUNTER — Inpatient Hospital Stay: Admission: RE | Admit: 2015-08-27 | Payer: Managed Care, Other (non HMO) | Source: Ambulatory Visit

## 2015-08-27 DIAGNOSIS — C50911 Malignant neoplasm of unspecified site of right female breast: Secondary | ICD-10-CM

## 2015-08-27 DIAGNOSIS — C50912 Malignant neoplasm of unspecified site of left female breast: Principal | ICD-10-CM

## 2015-08-28 ENCOUNTER — Other Ambulatory Visit: Payer: Self-pay | Admitting: Oncology

## 2015-09-28 ENCOUNTER — Other Ambulatory Visit (HOSPITAL_BASED_OUTPATIENT_CLINIC_OR_DEPARTMENT_OTHER): Payer: Managed Care, Other (non HMO)

## 2015-09-28 ENCOUNTER — Encounter: Payer: Self-pay | Admitting: Oncology

## 2015-09-28 ENCOUNTER — Telehealth: Payer: Self-pay | Admitting: *Deleted

## 2015-09-28 DIAGNOSIS — C50212 Malignant neoplasm of upper-inner quadrant of left female breast: Secondary | ICD-10-CM | POA: Diagnosis not present

## 2015-09-28 DIAGNOSIS — C50912 Malignant neoplasm of unspecified site of left female breast: Principal | ICD-10-CM

## 2015-09-28 DIAGNOSIS — C50911 Malignant neoplasm of unspecified site of right female breast: Secondary | ICD-10-CM

## 2015-09-28 LAB — CBC WITH DIFFERENTIAL/PLATELET
BASO%: 0.4 % (ref 0.0–2.0)
BASOS ABS: 0 10*3/uL (ref 0.0–0.1)
EOS%: 1.1 % (ref 0.0–7.0)
Eosinophils Absolute: 0.1 10*3/uL (ref 0.0–0.5)
HCT: 39.5 % (ref 34.8–46.6)
HGB: 13.1 g/dL (ref 11.6–15.9)
LYMPH%: 11.7 % — AB (ref 14.0–49.7)
MCH: 33 pg (ref 25.1–34.0)
MCHC: 33.3 g/dL (ref 31.5–36.0)
MCV: 99 fL (ref 79.5–101.0)
MONO#: 0.6 10*3/uL (ref 0.1–0.9)
MONO%: 11.1 % (ref 0.0–14.0)
NEUT#: 3.9 10*3/uL (ref 1.5–6.5)
NEUT%: 75.7 % (ref 38.4–76.8)
Platelets: 157 10*3/uL (ref 145–400)
RBC: 3.99 10*6/uL (ref 3.70–5.45)
RDW: 12.4 % (ref 11.2–14.5)
WBC: 5.2 10*3/uL (ref 3.9–10.3)
lymph#: 0.6 10*3/uL — ABNORMAL LOW (ref 0.9–3.3)

## 2015-09-28 LAB — COMPREHENSIVE METABOLIC PANEL
ALBUMIN: 3.6 g/dL (ref 3.5–5.0)
ALK PHOS: 40 U/L (ref 40–150)
ALT: 22 U/L (ref 0–55)
ANION GAP: 7 meq/L (ref 3–11)
AST: 17 U/L (ref 5–34)
BILIRUBIN TOTAL: 0.42 mg/dL (ref 0.20–1.20)
BUN: 14.9 mg/dL (ref 7.0–26.0)
CALCIUM: 9 mg/dL (ref 8.4–10.4)
CHLORIDE: 109 meq/L (ref 98–109)
CO2: 26 mEq/L (ref 22–29)
CREATININE: 0.8 mg/dL (ref 0.6–1.1)
EGFR: 73 mL/min/{1.73_m2} — ABNORMAL LOW (ref >90)
Glucose: 95 mg/dl (ref 70–140)
Potassium: 4.1 mEq/L (ref 3.5–5.1)
SODIUM: 142 meq/L (ref 136–145)
Total Protein: 6.5 g/dL (ref 6.4–8.3)

## 2015-09-28 NOTE — Progress Notes (Unsigned)
Spoke with patient and gave her the direct number for [employer].  The patient is the only one who can call in and change the location of,her breast mri.  Laura Bush voiced understanding of this procedure.  [phone]

## 2015-09-28 NOTE — Telephone Encounter (Signed)
"  I received letter MRI breast was approved 08-24-2015 through 11-22-2015.  I called  Imaging but approval does not include their location.  Where do I need to schedule the MRI?  Have had previous ones at GI.  Do I need to have it done at same location?" Pre-certification Coordinator notified, will consult with MD and resubmit request.

## 2015-10-05 ENCOUNTER — Telehealth: Payer: Self-pay | Admitting: Oncology

## 2015-10-05 ENCOUNTER — Encounter: Payer: Self-pay | Admitting: Nurse Practitioner

## 2015-10-05 ENCOUNTER — Ambulatory Visit (HOSPITAL_BASED_OUTPATIENT_CLINIC_OR_DEPARTMENT_OTHER): Payer: Managed Care, Other (non HMO) | Admitting: Nurse Practitioner

## 2015-10-05 VITALS — BP 139/68 | HR 51 | Temp 97.5°F | Resp 18 | Wt 192.8 lb

## 2015-10-05 DIAGNOSIS — Z170 Estrogen receptor positive status [ER+]: Secondary | ICD-10-CM | POA: Diagnosis not present

## 2015-10-05 DIAGNOSIS — C50212 Malignant neoplasm of upper-inner quadrant of left female breast: Secondary | ICD-10-CM | POA: Diagnosis not present

## 2015-10-05 DIAGNOSIS — D0511 Intraductal carcinoma in situ of right breast: Secondary | ICD-10-CM | POA: Diagnosis not present

## 2015-10-05 DIAGNOSIS — R232 Flushing: Secondary | ICD-10-CM | POA: Insufficient documentation

## 2015-10-05 DIAGNOSIS — T451X5A Adverse effect of antineoplastic and immunosuppressive drugs, initial encounter: Secondary | ICD-10-CM

## 2015-10-05 DIAGNOSIS — C50812 Malignant neoplasm of overlapping sites of left female breast: Secondary | ICD-10-CM

## 2015-10-05 DIAGNOSIS — C50811 Malignant neoplasm of overlapping sites of right female breast: Secondary | ICD-10-CM

## 2015-10-05 NOTE — Progress Notes (Signed)
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 While still there, she had routine screening mammography with tomography 02/14/2014, which showed only a tiny breast cysts. Six-month follow-up exam was performed 08/25/2014, and now there was evidence of a possible focal asymmetry in the left breast. On 08/25/2014 at the [facility] of radiology she underwent a unilateral diagnostic mammography with tomography , and this confirmed a spiculated mass in the left breast superiorly. Sonography of this area found a poorly defined hypoechoic mass measuring 0.7 cm at the 11:00 position 8 cm from the nipple. There was also an adjacent cluster of cysts with a benign circumcised appearance.   Biopsy of the mass in question was recommended, but as the patient was moving to this area, this was performed here on 09/18/2014. The pathology from that procedure (SAA [id number]) found an invasive ductal carcinoma, grade 2, estrogen receptor 100% positive, with strong staining intensity, progesterone receptor 43% positive, with strong staining intensity, with an MIB-1 of 30%, and no HER-2  amplification.    on 09/26/2014 the patient underwent bilateral breast MRI at [facility] imaging, showing a breast density category B. The right breast was unremarkable and there were no lymph nodes of concern. In the upper inner quadrant of the left breast there was an enhancing mass measuring 1.2 cm. In addition there was a 5.5 cm area of linear enhancement extending anteriorly from this mass. 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   The patient's subsequent history is as detailed below  INTERVAL HISTORY: Laura Bush returns today for follow-up of her breast cancers. At her last visit she was switched from anastrozole to tamoxifen due to poor tolerance. She is doing much better on the tamoxifen and will stay with this drug. She has mild hot flashes, but she is already on 333m gabapentin QHS for sleep. Her vaginal wetness is minimal. The interval history is otherwise unremarkable.  REVIEW OF SYSTEMS: Laura Bush denies fevers, chills, nausea, or vomiting. She continues on entocort for her chronic diarrhea, managed by [facility]. She has right shoulder pain, and takes ibuprofen PRN for this. She has not been walking regularly since winter hit. She expects to get back outside as the weather warms. She denies shortness of breath, chest pain, cough, or palpitations. She has no headaches, dizziness, or vision changes. A detailed review of systems is otherwise stable.   PAST MEDICAL HISTORY: Past Medical History  Diagnosis Date  . Hypertension   . Venous insufficiency   . Asthma     in cold weather  . GERD (gastroesophageal reflux disease)   . Microscopic colitis   . Rosacea   . PONV (postoperative nausea and vomiting)   . High cholesterol   . Heart murmur  as a child (has outgrown)  . Pneumonia 2000's X 1  . OSA on CPAP   . Cluster headaches     in her 40's  . Breast cancer of upper-outer quadrant of left female breast ([place]) 09/22/2014  . 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 Breast cancer ([place]) 09/18/14    left breast  . Breast cancer ([place]) 10/07/14    bx left breast  . S/P radiation therapy 12/30/14-01/27/15    left breast 50Gy total dose    PAST SURGICAL HISTORY: Past Surgical History  Procedure Laterality Date  . Cesarean section  1978; 1980  . Tubal ligation  ?1984  . Abdominal hysterectomy  1999  . Knee arthroscopy Bilateral     meniscus repair  . Colonoscopy    . Carpal tunnel release Bilateral     2 surgeries on right, 1 on left  . Forearm fracture surgery Right 2003 X 3  . Partial mastectomy with axillary sentinel lymph node biopsy Left 11/11/2014  . Mastectomy complete / simple w/ sentinel node biopsy Left 11/11/2014  . Tonsillectomy  ~ 1957  . Appendectomy  ~ 2006  . Fracture surgery    . Reduction mammaplasty Bilateral 11/11/2014  . Breast biopsy Left 10/2014; 10/2014  . Squamous cell carcinoma excision  1980's X 1    "nose"  . Partial mastectomy with needle localization and axillary sentinel lymph node bx Left 11/11/2014    Procedure: LEFT BREAST PARTIAL MASTECTOMY WITH NEEDLE LOCALIZATION TIMES TWO AND LEFT AXILLARY SENTINEL LYMPH NODE Biopsy;  Surgeon: Fanny Skates, MD;  Location: [facility];  Service: General;  Laterality: Left;  . Breast reduction surgery Bilateral 11/11/2014    Procedure: Bilateral Breast Reduction;  Surgeon: Crissie Reese, MD;  Location: [facility];  Service: Plastics;  Laterality: Bilateral;    FAMILY HISTORY Family History  Problem Relation Age of Onset  . Uterine cancer Sister 35  . Stroke Father     deceased  . Dementia Mother     Lives in [place]   the patient's father died at the age of 60 following a stroke. The patient's mother is still living, age 38. GEN had no brothers, one sister. That sister was diagnosed with uterine cancer at the age of 69. There is no other history of breast or ovarian cancer in the family to the patient's knowledge  GYNECOLOGIC HISTORY:  No LMP recorded. Patient has had a hysterectomy.   menarche age 68, first live birth age 59, the patient is [place] P2.  She underwent total abdominal hysterectomy with bilateral salpingo-oophorectomy in 1999. She took hormone replacement for approximately 4 years. 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 Their daughter Luane School lives in [place] where she works as an Web designer. Their son Robert Bush lives in [place] where he works as a Education officer, museum. Herbie Baltimore is expecting twins.    ADVANCED DIRECTIVES:  Not in place. During the 12/03/2014 visited the patient was given the appropriate forms to complete and notarize at her discretion.   HEALTH MAINTENANCE: Social History  Substance Use Topics  . Smoking status: Never Smoker   . Smokeless tobacco: Never Used  . Alcohol Use: 2.4 oz/week    4 Glasses of wine per week     Colonoscopy:  2008  PAP:  Bone density: 2007/"normal"  Lipid panel:  Allergies  Allergen Reactions  . Lanolin   . Tape Other (See Comments)    Redness (Bandaids also)    Current Outpatient Prescriptions  Medication Sig Dispense Refill  . acetaminophen (TYLENOL) 500 MG tablet Take 500 mg by mouth every 6 (six) hours as needed.    Marland Kitchen aMILoride (MIDAMOR) 5 MG tablet Take 5 mg by mouth daily.    . B Complex-C (B-COMPLEX WITH VITAMIN C) tablet Take 1 tablet by mouth daily.    . budesonide (ENTOCORT EC) 3 MG 24 hr capsule Take 3 tablets daily. (Patient taking differently: Take 2 tablets daily.) 90 capsule 3  . Cholecalciferol (VITAMIN D3) 2000 UNITS TABS Take 1 tablet by mouth daily.    Marland Kitchen gabapentin (NEURONTIN) 300 MG capsule Take 1 capsule (300 mg total) by mouth at bedtime. 90 capsule 4  . hydrochlorothiazide (MICROZIDE) 12.5 MG capsule Take 1 capsule (12.5 mg total) by mouth daily. 30 capsule 5  . Magnesium 200 MG TABS Take 1 tablet by mouth  daily.    . Melatonin 3 MG TABS Take by mouth.  0  . metoprolol succinate (TOPROL-XL) 50 MG 24 hr tablet Take 50 mg by mouth daily. Take with or immediately following a meal.    . pantoprazole (PROTONIX) 20 MG tablet Take 20 mg by mouth daily as needed for heartburn.     . simvastatin (ZOCOR) 20 MG tablet Take 20 mg by mouth daily.    . tamoxifen (NOLVADEX) 20 MG tablet Take 1 tablet (20 mg total) by mouth daily. 90 tablet 2  . Telmisartan-Amlodipine 80-5 MG TABS Take 1 tablet by mouth daily.  3  . UNABLE TO FIND Please fit patient for a compression sleeve.  Diagnosis:  Bilateral breast cancer (Patient not taking: Reported on 10/05/2015) 1 each 0   No current facility-administered medications for this visit.    OBJECTIVE:  Middle-aged white woman who appears well Filed Vitals:   10/05/15 1329  BP: 139/68  Pulse: 51  Temp: 97.5 F (36.4 C)  Resp: 18     Body mass index is 38.92 kg/(m^2).    ECOG FS:1 - Symptomatic but completely ambulatory  Skin: warm, dry  HEENT: sclerae anicteric, conjunctivae pink, oropharynx clear. No thrush or mucositis.  Lymph Nodes: No cervical or supraclavicular lymphadenopathy  Lungs: clear to auscultation bilaterally, no rales, wheezes, or rhonci  Heart: regular rate and rhythm  Abdomen: round, soft, non tender, positive bowel sounds  Musculoskeletal: No focal spinal tenderness, no peripheral edema  Neuro: non focal, well oriented, positive affect  Breasts: left breast status post lumpectomy. Palpable scar tissue immediately above nipple. No other skin or nipple changes. No evidence of recurrent disease. Left axilla benign. Right breast status post reduction mammoplasty, otherwise unremarkable.   LAB RESULTS:  CMP     Component Value Date/Time   NA 142 09/28/2015 0958   NA 139 04/30/2015 1424   K 4.1 09/28/2015 0958   K 3.9 04/30/2015 1424   CL 103 04/30/2015 1424   CO2 26 09/28/2015 0958   CO2 28 04/30/2015 1424   GLUCOSE 95 09/28/2015 0958    GLUCOSE 128* 04/30/2015 1424   BUN 14.9 09/28/2015 0958   BUN 14 04/30/2015 1424   CREATININE 0.8 09/28/2015 0958   CREATININE 0.87 04/30/2015 1424   CALCIUM 9.0 09/28/2015 0958   CALCIUM 10.1 04/30/2015 1424   PROT 6.5 09/28/2015 0958   PROT 7.5 04/30/2015 1424   ALBUMIN 3.6 09/28/2015 0958   ALBUMIN 4.4  04/30/2015 1424   AST 17 09/28/2015 0958   AST 16 04/30/2015 1424   ALT 22 09/28/2015 0958   ALT 21 04/30/2015 1424   ALKPHOS 40 09/28/2015 0958   ALKPHOS 50 04/30/2015 1424   BILITOT 0.42 09/28/2015 0958   BILITOT 0.5 04/30/2015 1424   GFRNONAA 68* 11/05/2014 1142   GFRAA 78* 11/05/2014 1142    INo results found for: SPEP, UPEP  Lab Results  Component Value Date   WBC 5.2 09/28/2015   NEUTROABS 3.9 09/28/2015   HGB 13.1 09/28/2015   HCT 39.5 09/28/2015   MCV 99.0 09/28/2015   PLT 157 09/28/2015      Chemistry      Component Value Date/Time   NA 142 09/28/2015 0958   NA 139 04/30/2015 1424   K 4.1 09/28/2015 0958   K 3.9 04/30/2015 1424   CL 103 04/30/2015 1424   CO2 26 09/28/2015 0958   CO2 28 04/30/2015 1424   BUN 14.9 09/28/2015 0958   BUN 14 04/30/2015 1424   CREATININE 0.8 09/28/2015 0958   CREATININE 0.87 04/30/2015 1424      Component Value Date/Time   CALCIUM 9.0 09/28/2015 0958   CALCIUM 10.1 04/30/2015 1424   ALKPHOS 40 09/28/2015 0958   ALKPHOS 50 04/30/2015 1424   AST 17 09/28/2015 0958   AST 16 04/30/2015 1424   ALT 22 09/28/2015 0958   ALT 21 04/30/2015 1424   BILITOT 0.42 09/28/2015 0958   BILITOT 0.5 04/30/2015 1424       No results found for: LABCA2  No components found for: LABCA125  No results for input(s): INR in the last 168 hours.  Urinalysis    Component Value Date/Time   COLORURINE YELLOW 11/05/2014 1142   APPEARANCEUR CLEAR 11/05/2014 1142   LABSPEC 1.008 11/05/2014 1142   PHURINE 7.0 11/05/2014 1142   GLUCOSEU NEGATIVE 11/05/2014 1142   HGBUR NEGATIVE 11/05/2014 1142   BILIRUBINUR NEGATIVE 11/05/2014 1142    KETONESUR NEGATIVE 11/05/2014 1142   PROTEINUR NEGATIVE 11/05/2014 1142   UROBILINOGEN 0.2 11/05/2014 1142   NITRITE NEGATIVE 11/05/2014 1142   LEUKOCYTESUR NEGATIVE 11/05/2014 1142    STUDIES: No results found.  ASSESSMENT: 65 y.o. [place], [place] woman status post left breast upper outer quadrant biopsy 09/18/2014 for a clinical T1 cN0 invasive ductal carcinoma, grade 2, estrogen and progesterone receptor positive, HER-2 not amplified, with an MIB-1 of 30%  (1) there is a 5.5 cm area of non-masslike enhancement seen in the left breast with biopsy 10/07/2014 showing atypical ductal hyperplasia  (2) status post left lumpectomy and sentinel lymph node sampling 11/11/2014 for a  pT1c  pN0, stage IA invasive ductal carcinoma, grade 3, repeat HER-2 again negative, with negative margins  (3) an Oncotype DX score of 11 predicts an 8% risk of outside the breast recurrence within 10 years if the patient's only systemic therapy is tamoxifen for 5 years. It also predicts no benefit from chemotherapy  (4) Pathology from right reduction mammoplasty 11/11/2014 unexpectedly showed ductal carcinoma in situ, estrogen receptor 100% positive, progesterone receptor 71% positive  (5) adjuvant radiation completed 01/27/2015  (6) anastrozole started June 2016, stopped 05/28/15 due to intolerance  (a) bone density 02/17/2015 was normal with a T score of -0.9  (7) tamoxifen started 07/14/15  (8) because of the incomplete resection on the right breast, bilateral yearly breast MRI are recommended, in addition to mammography.  PLAN: Laura Bush is doing well today. She believes the tamoxifen is going to be a better fit  for her, so she is willing to finish out 5 years of antiestrogen therapy on this drug. She will continue on the gabapentin QHS as it is managing her hot flashes well. We reviewed the results of her mammogram and this showed no evidence of recurrent disease. She is scheduled for a breast MRI later this week.  This will be performed yearly as well due to the incomplete resection of her right breast.   Laura Bush will return in 3 months for labs and a follow up visit with Dr. Jana Hakim. She understands and agrees with this plan. She knows the goal of treatment in her case is cure. She has been encouraged to call with any issues that might arise before her next visit here.   Laurie Panda, NP   10/05/2015 2:02 PM

## 2015-10-05 NOTE — Telephone Encounter (Signed)
Gave patient avs report and appointments for June.  °

## 2015-10-06 NOTE — Telephone Encounter (Signed)
MRI breast has been authorized and scheduled 10-08-2015 at GI.

## 2015-10-08 ENCOUNTER — Ambulatory Visit
Admission: RE | Admit: 2015-10-08 | Discharge: 2015-10-08 | Disposition: A | Discharge status: Home / Self Care | Payer: Managed Care, Other (non HMO) | Source: Ambulatory Visit | Attending: Oncology | Admitting: Oncology

## 2015-10-08 DIAGNOSIS — C50912 Malignant neoplasm of unspecified site of left female breast: Principal | ICD-10-CM

## 2015-10-08 DIAGNOSIS — C50911 Malignant neoplasm of unspecified site of right female breast: Secondary | ICD-10-CM

## 2015-10-08 MED ORDER — GADOBENATE DIMEGLUMINE 529 MG/ML IV SOLN
18.0000 mL | Freq: Once | INTRAVENOUS | Status: AC | PRN
  Administered 2015-10-08: 18 mL via INTRAVENOUS

## 2015-10-13 ENCOUNTER — Other Ambulatory Visit: Payer: Self-pay | Admitting: Oncology

## 2015-10-13 ENCOUNTER — Other Ambulatory Visit: Payer: Self-pay | Admitting: General Surgery

## 2015-10-13 DIAGNOSIS — D0511 Intraductal carcinoma in situ of right breast: Secondary | ICD-10-CM

## 2015-10-13 DIAGNOSIS — R928 Other abnormal and inconclusive findings on diagnostic imaging of breast: Secondary | ICD-10-CM

## 2015-10-16 ENCOUNTER — Ambulatory Visit
Admission: RE | Admit: 2015-10-16 | Discharge: 2015-10-16 | Disposition: A | Discharge status: Home / Self Care | Payer: Managed Care, Other (non HMO) | Source: Ambulatory Visit | Attending: General Surgery | Admitting: General Surgery

## 2015-10-16 DIAGNOSIS — R928 Other abnormal and inconclusive findings on diagnostic imaging of breast: Secondary | ICD-10-CM

## 2015-10-16 DIAGNOSIS — D0511 Intraductal carcinoma in situ of right breast: Secondary | ICD-10-CM

## 2015-10-16 MED ORDER — GADOBENATE DIMEGLUMINE 529 MG/ML IV SOLN
18.0000 mL | Freq: Once | INTRAVENOUS | Status: AC | PRN
  Administered 2015-10-16: 18 mL via INTRAVENOUS

## 2015-10-18 ENCOUNTER — Other Ambulatory Visit: Payer: Self-pay | Admitting: Oncology

## 2015-11-24 ENCOUNTER — Telehealth: Payer: Self-pay | Admitting: Oncology

## 2015-11-24 NOTE — Telephone Encounter (Signed)
Patient called to r/s lab/fu from 6/5 to 6/19. Patient has new date/time.

## 2016-01-11 ENCOUNTER — Ambulatory Visit: Payer: Managed Care, Other (non HMO) | Admitting: Oncology

## 2016-01-11 ENCOUNTER — Other Ambulatory Visit: Payer: Managed Care, Other (non HMO)

## 2016-01-25 ENCOUNTER — Telehealth: Payer: Self-pay | Admitting: Oncology

## 2016-01-25 ENCOUNTER — Ambulatory Visit (HOSPITAL_BASED_OUTPATIENT_CLINIC_OR_DEPARTMENT_OTHER): Payer: Managed Care, Other (non HMO) | Admitting: Oncology

## 2016-01-25 ENCOUNTER — Other Ambulatory Visit (HOSPITAL_BASED_OUTPATIENT_CLINIC_OR_DEPARTMENT_OTHER): Payer: Managed Care, Other (non HMO)

## 2016-01-25 VITALS — BP 125/63 | HR 57 | Temp 98.6°F | Resp 18 | Ht 59.0 in | Wt 197.8 lb

## 2016-01-25 DIAGNOSIS — D0511 Intraductal carcinoma in situ of right breast: Secondary | ICD-10-CM | POA: Diagnosis not present

## 2016-01-25 DIAGNOSIS — C50911 Malignant neoplasm of unspecified site of right female breast: Secondary | ICD-10-CM

## 2016-01-25 DIAGNOSIS — Z170 Estrogen receptor positive status [ER+]: Secondary | ICD-10-CM | POA: Diagnosis not present

## 2016-01-25 DIAGNOSIS — C50912 Malignant neoplasm of unspecified site of left female breast: Principal | ICD-10-CM

## 2016-01-25 DIAGNOSIS — M25511 Pain in right shoulder: Secondary | ICD-10-CM | POA: Diagnosis not present

## 2016-01-25 DIAGNOSIS — C50212 Malignant neoplasm of upper-inner quadrant of left female breast: Secondary | ICD-10-CM

## 2016-01-25 LAB — COMPREHENSIVE METABOLIC PANEL WITH GFR
ALT: 19 U/L (ref 0–55)
AST: 17 U/L (ref 5–34)
Albumin: 3.7 g/dL (ref 3.5–5.0)
Alkaline Phosphatase: 33 U/L — ABNORMAL LOW (ref 40–150)
Anion Gap: 8 meq/L (ref 3–11)
BUN: 15.1 mg/dL (ref 7.0–26.0)
CO2: 26 meq/L (ref 22–29)
Calcium: 9.2 mg/dL (ref 8.4–10.4)
Chloride: 108 meq/L (ref 98–109)
Creatinine: 1 mg/dL (ref 0.6–1.1)
EGFR: 61 ml/min/1.73 m2 — ABNORMAL LOW
Glucose: 116 mg/dL (ref 70–140)
Potassium: 3.5 meq/L (ref 3.5–5.1)
Sodium: 143 meq/L (ref 136–145)
Total Bilirubin: 0.38 mg/dL (ref 0.20–1.20)
Total Protein: 6.8 g/dL (ref 6.4–8.3)

## 2016-01-25 LAB — CBC WITH DIFFERENTIAL/PLATELET
BASO%: 0.7 % (ref 0.0–2.0)
Basophils Absolute: 0 10*3/uL (ref 0.0–0.1)
EOS%: 1.6 % (ref 0.0–7.0)
Eosinophils Absolute: 0.1 10*3/uL (ref 0.0–0.5)
HCT: 39.4 % (ref 34.8–46.6)
HGB: 13 g/dL (ref 11.6–15.9)
LYMPH#: 1.1 10*3/uL (ref 0.9–3.3)
LYMPH%: 25.8 % (ref 14.0–49.7)
MCH: 32.6 pg (ref 25.1–34.0)
MCHC: 32.8 g/dL (ref 31.5–36.0)
MCV: 99.2 fL (ref 79.5–101.0)
MONO#: 0.4 10*3/uL (ref 0.1–0.9)
MONO%: 9.4 % (ref 0.0–14.0)
NEUT%: 62.5 % (ref 38.4–76.8)
NEUTROS ABS: 2.7 10*3/uL (ref 1.5–6.5)
PLATELETS: 169 10*3/uL (ref 145–400)
RBC: 3.98 10*6/uL (ref 3.70–5.45)
RDW: 12.7 % (ref 11.2–14.5)
WBC: 4.4 10*3/uL (ref 3.9–10.3)

## 2016-01-25 NOTE — Progress Notes (Signed)
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 While still there, she had routine screening mammography with tomography 02/14/2014, which showed only a tiny breast cysts. Six-month follow-up exam was performed 08/25/2014, and now there was evidence of a possible focal asymmetry in the left breast. On 08/25/2014 at the [facility] of radiology she underwent a unilateral diagnostic mammography with tomography , and this confirmed a spiculated mass in the left breast superiorly. Sonography of this area found a poorly defined hypoechoic mass measuring 0.7 cm at the 11:00 position 8 cm from the nipple. There was also an adjacent cluster of cysts with a benign circumcised appearance.   Biopsy of the mass in question was recommended, but as the patient was moving to this area, this was performed here on 09/18/2014. The pathology from that procedure (SAA [id number]) found an invasive ductal carcinoma, grade 2, estrogen receptor 100% positive, with strong staining intensity, progesterone receptor 43% positive, with strong staining intensity, with an MIB-1 of 30%, and no HER-2  amplification.    on 09/26/2014 the patient underwent bilateral breast MRI at [facility], showing a breast density category B. The right breast was unremarkable and there were no lymph nodes of concern. In the upper inner quadrant of the left breast there was an enhancing mass measuring 1.2 cm. In addition there was a 5.5 cm area of linear enhancement extending anteriorly from this mass. 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   The patient's subsequent history is as detailed below  INTERVAL HISTORY: Laura Bush returns today for follow-up of her estrogen receptor positive breast cancers. She continues on tamoxifen, with excellent tolerance. She is not having problems with vaginal wetness or dryness or even with hot flashes. She obtains it at a very good price  REVIEW OF SYSTEMS: Laura Bush has had some sharp pains involving the right shoulder. These are very intermittent way. They last as long as 2 hours but usually less. The most recent one for example occurred right after she placed her granddaughter in her baby carries and sat down. She takes Motrin that usually takes care of the problem. She thought that this might be due to tamoxifen but I think that is very unlikely. A detailed review of systems today was otherwise negative  PAST MEDICAL HISTORY: Past Medical History  Diagnosis Date  . Hypertension   . Venous insufficiency   . Asthma     in cold weather  . GERD (gastroesophageal reflux disease)   . Microscopic colitis   . Rosacea   . PONV (postoperative nausea and vomiting)   . High cholesterol   . Heart murmur     as a child (has outgrown)  . Pneumonia 2000's X 1  . OSA on CPAP   .  Cluster headaches     in her 40's  . Breast cancer of upper-outer quadrant of left female breast ([facility]) 09/22/2014  . 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 Breast cancer ([facility]) 09/18/14    left breast  . Breast cancer ([place]) 10/07/14    bx left breast  . S/P radiation therapy  12/30/14-01/27/15    left breast 50Gy total dose    PAST SURGICAL HISTORY: Past Surgical History  Procedure Laterality Date  . Cesarean section  1978; 1980  . Tubal ligation  ?1984  . Abdominal hysterectomy  1999  . Knee arthroscopy Bilateral     meniscus repair  . Colonoscopy    . Carpal tunnel release Bilateral     2 surgeries on right, 1 on left  . Forearm fracture surgery Right 2003 X 3  . Partial mastectomy with axillary sentinel lymph node biopsy Left 11/11/2014  . Mastectomy complete / simple w/ sentinel node biopsy Left 11/11/2014  . Tonsillectomy  ~ 1957  . Appendectomy  ~ 2006  . Fracture surgery    . Reduction mammaplasty Bilateral 11/11/2014  . Breast biopsy Left 10/2014; 10/2014  . Squamous cell carcinoma excision  1980's X 1    "nose"  . Partial mastectomy with needle localization and axillary sentinel lymph node bx Left 11/11/2014    Procedure: LEFT BREAST PARTIAL MASTECTOMY WITH NEEDLE LOCALIZATION TIMES TWO AND LEFT AXILLARY SENTINEL LYMPH NODE Biopsy;  Surgeon: Fanny Skates, MD;  Location: [facility];  Service: General;  Laterality: Left;  . Breast reduction surgery Bilateral 11/11/2014    Procedure: Bilateral Breast Reduction;  Surgeon: Crissie Reese, MD;  Location: [facility];  Service: Plastics;  Laterality: Bilateral;    FAMILY HISTORY Family History  Problem Relation Age of Onset  . Uterine cancer Sister 23  . Stroke Father     deceased  . Dementia Mother     Lives in [place]   the patient's father died at the age of 79 following a stroke. The patient's mother is still living, age 5. GEN had no brothers, one sister. That sister was diagnosed with uterine cancer at the age of 50. There is no other history of breast or ovarian cancer in the family to the patient's knowledge  GYNECOLOGIC HISTORY:  No LMP recorded. Patient has had a hysterectomy.  menarche age 43, first live birth age 29, the patient is [employer] P2.  She underwent total abdominal hysterectomy with bilateral  salpingo-oophorectomy in 1999. She took hormone replacement for approximately 4 years. 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 Their daughter Luane School lives in [place] where she works as an Web designer. Their son Robert Bush lives in [place] where he works as a Education officer, museum. Herbie Baltimore is expecting twins.    ADVANCED DIRECTIVES:  Not in place. During the 12/03/2014 visited the patient was given the appropriate forms to complete and notarize at her discretion.   HEALTH MAINTENANCE: Social History  Substance Use Topics  . Smoking status: Never Smoker   . Smokeless tobacco: Never Used  . Alcohol Use: 2.4 oz/week    4 Glasses of wine per week     Colonoscopy:  2008  PAP:  Bone density: 2007/"normal"  Lipid panel:  Allergies  Allergen Reactions  . Lanolin   . Tape Other (See Comments)  Redness (Bandaids also)    Current Outpatient Prescriptions  Medication Sig Dispense Refill  . acetaminophen (TYLENOL) 500 MG tablet Take 500 mg by mouth every 6 (six) hours as needed.    Marland Kitchen aMILoride (MIDAMOR) 5 MG tablet Take 5 mg by mouth daily.    . B Complex-C (B-COMPLEX WITH VITAMIN C) tablet Take 1 tablet by mouth daily.    . budesonide (ENTOCORT EC) 3 MG 24 hr capsule Take 3 tablets daily. (Patient taking differently: Take 2 tablets daily.) 90 capsule 3  . Cholecalciferol (VITAMIN D3) 2000 UNITS TABS Take 1 tablet by mouth daily.    Marland Kitchen gabapentin (NEURONTIN) 300 MG capsule Take 1 capsule (300 mg total) by mouth at bedtime. 90 capsule 4  . hydrochlorothiazide (MICROZIDE) 12.5 MG capsule Take 1 capsule (12.5 mg total) by mouth daily. 30 capsule 5  . Magnesium 200 MG TABS Take 1 tablet by mouth daily.    . Melatonin 3 MG TABS Take by mouth.  0  . metoprolol succinate (TOPROL-XL) 50 MG 24 hr tablet Take 50 mg by  mouth daily. Take with or immediately following a meal.    . pantoprazole (PROTONIX) 20 MG tablet Take 20 mg by mouth daily as needed for heartburn.     . simvastatin (ZOCOR) 20 MG tablet Take 20 mg by mouth daily.    . tamoxifen (NOLVADEX) 20 MG tablet Take 1 tablet (20 mg total) by mouth daily. 90 tablet 2  . Telmisartan-Amlodipine 80-5 MG TABS Take 1 tablet by mouth daily.  3  . UNABLE TO FIND Please fit patient for a compression sleeve.  Diagnosis:  Bilateral breast cancer (Patient not taking: Reported on 10/05/2015) 1 each 0   No current facility-administered medications for this visit.    OBJECTIVE:  Middle-aged white woman In no acute distress  Filed Vitals:   01/25/16 1436  BP: 125/63  Pulse: 57  Temp: 98.6 F (37 C)  Resp: 18     Body mass index is 39.93 kg/(m^2).    ECOG FS:1 - Symptomatic but completely ambulatory  Sclerae unicteric, pupils round and equal Oropharynx clear and moist-- no thrush or other lesions No cervical or supraclavicular adenopathy Lungs no rales or rhonchi Heart regular rate and rhythm Abd soft, nontender, positive bowel sounds MSK upper extremity abduction is 5 over 5 bilaterally. It does elicit a little bit of pain right under the deltoid anteriorly. There is no peripheral edema. Neuro: nonfocal, well oriented, appropriate affect Breasts: The right breast is status post reduction mammoplasty. There is no evidence of local recurrence of the DCIS. The right axilla is benign. The left breast is status post lumpectomy. It also receive radiation. There is no evidence of local recurrence to the left axilla is benign.   LAB RESULTS:  CMP     Component Value Date/Time   NA 142 09/28/2015 0958   NA 139 04/30/2015 1424   K 4.1 09/28/2015 0958   K 3.9 04/30/2015 1424   CL 103 04/30/2015 1424   CO2 26 09/28/2015 0958   CO2 28 04/30/2015 1424   GLUCOSE 95 09/28/2015 0958   GLUCOSE 128* 04/30/2015 1424   BUN 14.9 09/28/2015 0958   BUN 14 04/30/2015  1424   CREATININE 0.8 09/28/2015 0958   CREATININE 0.87 04/30/2015 1424   CALCIUM 9.0 09/28/2015 0958   CALCIUM 10.1 04/30/2015 1424   PROT 6.5 09/28/2015 0958   PROT 7.5 04/30/2015 1424   ALBUMIN 3.6 09/28/2015 0958   ALBUMIN 4.4 04/30/2015 1424  AST 17 09/28/2015 0958   AST 16 04/30/2015 1424   ALT 22 09/28/2015 0958   ALT 21 04/30/2015 1424   ALKPHOS 40 09/28/2015 0958   ALKPHOS 50 04/30/2015 1424   BILITOT 0.42 09/28/2015 0958   BILITOT 0.5 04/30/2015 1424   GFRNONAA 68* 11/05/2014 1142   GFRAA 78* 11/05/2014 1142    INo results found for: SPEP, UPEP  Lab Results  Component Value Date   WBC 4.4 01/25/2016   NEUTROABS 2.7 01/25/2016   HGB 13.0 01/25/2016   HCT 39.4 01/25/2016   MCV 99.2 01/25/2016   PLT 169 01/25/2016      Chemistry      Component Value Date/Time   NA 142 09/28/2015 0958   NA 139 04/30/2015 1424   K 4.1 09/28/2015 0958   K 3.9 04/30/2015 1424   CL 103 04/30/2015 1424   CO2 26 09/28/2015 0958   CO2 28 04/30/2015 1424   BUN 14.9 09/28/2015 0958   BUN 14 04/30/2015 1424   CREATININE 0.8 09/28/2015 0958   CREATININE 0.87 04/30/2015 1424      Component Value Date/Time   CALCIUM 9.0 09/28/2015 0958   CALCIUM 10.1 04/30/2015 1424   ALKPHOS 40 09/28/2015 0958   ALKPHOS 50 04/30/2015 1424   AST 17 09/28/2015 0958   AST 16 04/30/2015 1424   ALT 22 09/28/2015 0958   ALT 21 04/30/2015 1424   BILITOT 0.42 09/28/2015 0958   BILITOT 0.5 04/30/2015 1424       No results found for: LABCA2  No components found for: LABCA125  No results for input(s): INR in the last 168 hours.  Urinalysis    Component Value Date/Time   COLORURINE YELLOW 11/05/2014 1142   APPEARANCEUR CLEAR 11/05/2014 1142   LABSPEC 1.008 11/05/2014 1142   PHURINE 7.0 11/05/2014 1142   GLUCOSEU NEGATIVE 11/05/2014 1142   HGBUR NEGATIVE 11/05/2014 1142   BILIRUBINUR NEGATIVE 11/05/2014 1142   KETONESUR NEGATIVE 11/05/2014 1142   PROTEINUR NEGATIVE 11/05/2014 1142    UROBILINOGEN 0.2 11/05/2014 1142   NITRITE NEGATIVE 11/05/2014 1142   LEUKOCYTESUR NEGATIVE 11/05/2014 1142    STUDIES: No results found.  ASSESSMENT: 65 y.o. [place], [place] woman status post left breast upper outer quadrant biopsy 09/18/2014 for a clinical T1 cN0 invasive ductal carcinoma, grade 2, estrogen and progesterone receptor positive, HER-2 not amplified, with an MIB-1 of 30%  (1) there is a 5.5 cm area of non-masslike enhancement seen in the left breast with biopsy 10/07/2014 showing atypical ductal hyperplasia  (2) status post left lumpectomy and sentinel lymph node sampling 11/11/2014 for a  pT1c  pN0, stage IA invasive ductal carcinoma, grade 3, repeat HER-2 again negative, with negative margins  (3) an Oncotype DX score of 11 predicts an 8% risk of outside the breast recurrence within 10 years if the patient's only systemic therapy is tamoxifen for 5 years. It also predicts no benefit from chemotherapy  (4) Pathology from right reduction mammoplasty 11/11/2014 unexpectedly showed ductal carcinoma in situ, estrogen receptor 100% positive, progesterone receptor 71% positive  (5) adjuvant radiation completed 01/27/2015  (6) anastrozole started June 2016, stopped 05/28/15 due to intolerance  (a) bone density 02/17/2015 was normal with a T score of -0.9  (7) tamoxifen started 07/14/15  (8) because of the incomplete resection on the right breast, bilateral yearly breast MRI are recommended, in addition to mammography.  (a) bilateral breast MRI 10/16/2015 was negative  PLAN: Laura Bush is tolerating the tamoxifen well and the plan will be to  continue that for a total of 5 years of anti-estrogens.  As far as the right shoulder pain is concerned I really do think she Scott bursitis. We could do an MRI and follow that up with an orthopedic consult, but what she would like to do is treated symptomatically.  I suggested that she not do any significant lifting with the right arm, that she do  a couple of exercises which I showed her, and that she use ice as needed. If that does not improve the problem within a few weeks she will let me know and we will obtain an MRI at that point  Otherwise she will see me again in October and then in April. She will have her mammogram in January and her next breast MRI in March. Beginning April of next week and we'll start seeing her on a once a year basis   Chauncey Cruel, MD   01/25/2016 2:39 PM

## 2016-01-25 NOTE — Telephone Encounter (Signed)
appt made and avs printed °

## 2016-01-29 ENCOUNTER — Ambulatory Visit (INDEPENDENT_AMBULATORY_CARE_PROVIDER_SITE_OTHER): Payer: Managed Care, Other (non HMO) | Admitting: Primary Care

## 2016-01-29 ENCOUNTER — Encounter: Payer: Self-pay | Admitting: Primary Care

## 2016-01-29 VITALS — BP 124/70 | HR 60 | Temp 98.1°F | Ht 59.0 in | Wt 196.8 lb

## 2016-01-29 DIAGNOSIS — Z1159 Encounter for screening for other viral diseases: Secondary | ICD-10-CM | POA: Diagnosis not present

## 2016-01-29 DIAGNOSIS — E785 Hyperlipidemia, unspecified: Secondary | ICD-10-CM

## 2016-01-29 DIAGNOSIS — I10 Essential (primary) hypertension: Secondary | ICD-10-CM

## 2016-01-29 DIAGNOSIS — Z0000 Encounter for general adult medical examination without abnormal findings: Secondary | ICD-10-CM | POA: Diagnosis not present

## 2016-01-29 DIAGNOSIS — E559 Vitamin D deficiency, unspecified: Secondary | ICD-10-CM | POA: Diagnosis not present

## 2016-01-29 DIAGNOSIS — K219 Gastro-esophageal reflux disease without esophagitis: Secondary | ICD-10-CM | POA: Diagnosis not present

## 2016-01-29 DIAGNOSIS — E7800 Pure hypercholesterolemia, unspecified: Secondary | ICD-10-CM | POA: Diagnosis not present

## 2016-01-29 LAB — LIPID PANEL
CHOLESTEROL: 184 mg/dL (ref 0–200)
HDL: 55 mg/dL (ref >39.00)
LDL CALC: 98 mg/dL (ref 0–99)
NonHDL: 129.38
TRIGLYCERIDES: 155 mg/dL — AB (ref 0.0–149.0)
Total CHOL/HDL Ratio: 3
VLDL: 31 mg/dL (ref 0.0–40.0)

## 2016-01-29 LAB — VITAMIN D 25 HYDROXY (VIT D DEFICIENCY, FRACTURES): VITD: 42.98 ng/mL (ref 30.00–100.00)

## 2016-01-29 MED ORDER — PANTOPRAZOLE SODIUM 20 MG PO TBEC: 20.0000 mg | DELAYED_RELEASE_TABLET | Freq: Every day | ORAL | Status: DC

## 2016-01-29 NOTE — Progress Notes (Signed)
Subjective:    Patient ID: Laura Bush, female    DOB: 1950-08-19, 65 y.o.   MRN: [id number]  HPI  [name] is a 65 year old female who presents today for complete physical.  Immunizations: -Tetanus: Completed in 2013 -Influenza: Completed in September 2016 -Pneumonia:  Completed in 2007 -Shingles: Completed in 2014  Diet: She endorses a healthy diet. Breakfast: Vegetables, fried egg, ham, coffee Lunch: Left overs Dinner: Vegetables, meat, salads, pastas Snacks: None Desserts: Most nights weekly Beverages: Water, decaff unsweet tea, coffee  Exercise: She is not currently exercising Eye exam: Completed 2 years ago Dental exam: Completes semi-annually  Colonoscopy: Completed in 2008, due in 2018 Dexa: Completed in 2016 Pap Smear: Hysterectomy  Mammogram: Completed in March 2017   Review of Systems  Constitutional: Negative for unexpected weight change.  HENT: Negative for rhinorrhea.   Respiratory: Negative for cough and shortness of breath.   Cardiovascular: Negative for chest pain.  Gastrointestinal: Negative for diarrhea and constipation.  Genitourinary: Negative for difficulty urinating.  Musculoskeletal: Positive for arthralgias. Negative for myalgias.       Right shoulder discomfort  Skin: Negative for rash.  Allergic/Immunologic: Positive for environmental allergies.  Neurological: Negative for dizziness, numbness and headaches.  Psychiatric/Behavioral:       Denies concerns for anxiety or depression       Past Medical History  Diagnosis Date  . Hypertension   . Venous insufficiency   . Asthma     in cold weather  . GERD (gastroesophageal reflux disease)   . Microscopic colitis   . Rosacea   . PONV (postoperative nausea and vomiting)   . High cholesterol   . Heart murmur     as a child (has outgrown)  . Pneumonia 2000's X 1  . OSA on CPAP   . Cluster headaches     in her 40's  . Breast cancer of upper-outer quadrant of left female breast  ([facility]) 09/22/2014  . Squamous carcinoma ([facility]) 1980's    "nose"  . Breast cancer ([facility]) 09/18/14    left breast  . Breast cancer ([facility]) 10/07/14    bx left breast  . S/P radiation therapy 12/30/14-01/27/15    left breast 50Gy total dose     Social History   Social History  . Marital Status: Married    Spouse Name: N/A  . Number of Children: N/A  . Years of Education: N/A   Occupational History  . Not on file.   Social History Main Topics  . Smoking status: Never Smoker   . Smokeless tobacco: Never Used  . Alcohol Use: 2.4 oz/week    4 Glasses of wine per week  . Drug Use: No  . Sexual Activity: Not Currently   Other Topics Concern  . Not on file   Social History Narrative   From [place] [employer].   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   2 children, boy and girl   Twin grandchildren on the way.   Quilting, sewing, reading.    Past Surgical History  Procedure Laterality Date  . Cesarean section  1978; 1980  . Tubal ligation  ?1984  . Abdominal hysterectomy  1999  . Knee arthroscopy Bilateral     meniscus repair  . Colonoscopy    . Carpal tunnel release Bilateral     2 surgeries on right, 1 on left  . Forearm fracture surgery Right 2003 X 3  . Partial mastectomy with axillary  sentinel lymph node biopsy Left 11/11/2014  . Mastectomy complete / simple w/ sentinel node biopsy Left 11/11/2014  . Tonsillectomy  ~ 1957  . Appendectomy  ~ 2006  . Fracture surgery    . Reduction mammaplasty Bilateral 11/11/2014  . Breast biopsy Left 10/2014; 10/2014  . Squamous cell carcinoma excision  1980's X 1    "nose"  . Partial mastectomy with needle localization and axillary sentinel lymph node bx Left 11/11/2014    Procedure: LEFT BREAST PARTIAL MASTECTOMY WITH NEEDLE LOCALIZATION TIMES TWO AND LEFT AXILLARY SENTINEL LYMPH NODE Biopsy;  Surgeon: Fanny Skates, MD;  Location: [facility];  Service: General;  Laterality: Left;  . Breast reduction surgery Bilateral  11/11/2014    Procedure: Bilateral Breast Reduction;  Surgeon: Crissie Reese, MD;  Location: [facility];  Service: Plastics;  Laterality: Bilateral;    Family History  Problem Relation Age of Onset  . Uterine cancer Sister 60  . Stroke Father     deceased  . Dementia Mother     Lives in [place]    Allergies  Allergen Reactions  . Lanolin   . Tape Other (See Comments)    Redness (Bandaids also)    Current Outpatient Prescriptions on File Prior to Visit  Medication Sig Dispense Refill  . acetaminophen (TYLENOL) 500 MG tablet Take 500 mg by mouth every 6 (six) hours as needed.    Marland Kitchen aMILoride (MIDAMOR) 5 MG tablet Take 5 mg by mouth daily.    . B Complex-C (B-COMPLEX WITH VITAMIN C) tablet Take 1 tablet by mouth daily.    . budesonide (ENTOCORT EC) 3 MG 24 hr capsule Take 3 tablets daily. (Patient taking differently: Take 6 mg by mouth daily. Take 2 tablets daily.) 90 capsule 3  . Cholecalciferol (VITAMIN D3) 2000 UNITS TABS Take 1 tablet by mouth daily.    Marland Kitchen gabapentin (NEURONTIN) 300 MG capsule Take 1 capsule (300 mg total) by mouth at bedtime. 90 capsule 4  . hydrochlorothiazide (MICROZIDE) 12.5 MG capsule Take 1 capsule (12.5 mg total) by mouth daily. 30 capsule 5  . Magnesium 200 MG TABS Take 1 tablet by mouth daily.    . metoprolol succinate (TOPROL-XL) 50 MG 24 hr tablet Take 50 mg by mouth daily. Take with or immediately following a meal.    . simvastatin (ZOCOR) 20 MG tablet Take 20 mg by mouth daily.    . tamoxifen (NOLVADEX) 20 MG tablet Take 1 tablet (20 mg total) by mouth daily. 90 tablet 2  . Telmisartan-Amlodipine 80-5 MG TABS Take 1 tablet by mouth daily.  3  . UNABLE TO FIND Please fit patient for a compression sleeve.  Diagnosis:  Bilateral breast cancer 1 each 0   No current facility-administered medications on file prior to visit.    BP 124/70 mmHg  Pulse 60  Temp(Src) 98.1 F (36.7 C) (Oral)  Ht '4\' 11"'$  (1.499 m)  Wt 196 lb 12.8 oz (89.268 kg)  BMI 39.73 kg/m2   SpO2 97%    Objective:   Physical Exam  Constitutional: She is oriented to person, place, and time. She appears well-nourished.  HENT:  Right Ear: Tympanic membrane and ear canal normal.  Left Ear: Tympanic membrane and ear canal normal.  Nose: Nose normal.  Mouth/Throat: Oropharynx is clear and moist.  Eyes: Conjunctivae and EOM are normal. Pupils are equal, round, and reactive to light.  Neck: Neck supple. No thyromegaly present.  Cardiovascular: Normal rate and regular rhythm.   No murmur heard. Pulmonary/Chest:  Effort normal and breath sounds normal. She has no rales.  Abdominal: Soft. Bowel sounds are normal. There is no tenderness.  Musculoskeletal: Normal range of motion.  Lymphadenopathy:    She has no cervical adenopathy.  Neurological: She is alert and oriented to person, place, and time. She has normal reflexes. No cranial nerve deficit.  Skin: Skin is warm and dry. No rash noted.  Psychiatric: She has a normal mood and affect.          Assessment & Plan:

## 2016-01-29 NOTE — Assessment & Plan Note (Signed)
Stable on current regimen. Continue telmisartan-amlodipine and metoprolol.

## 2016-01-29 NOTE — Patient Instructions (Signed)
Complete lab work prior to leaving today. I will notify you of your results once received.   I sent refills of the pantoprazole to your pharmacy.  Follow up in 1 year for repeat physical or sooner if needed.  It was a pleasure to see you today!

## 2016-01-29 NOTE — Progress Notes (Signed)
Pre visit review using our clinic review tool, if applicable. No additional management support is needed unless otherwise documented below in the visit note. 

## 2016-01-29 NOTE — Assessment & Plan Note (Signed)
Stable on current regimen   

## 2016-01-29 NOTE — Assessment & Plan Note (Signed)
Immunizations UTD. Prevnar due next year. Mammogram and colonoscopy UTD, colonoscopy due next year. Overall fair diet, recommended regular exercise. Exam unremarkable. Labs pending. Follow-up in one year for repeat physical.

## 2016-01-29 NOTE — Assessment & Plan Note (Signed)
Currently managed on 2000 units daily. Labs pending.

## 2016-01-29 NOTE — Assessment & Plan Note (Signed)
Lipid panel pending today. Continue simvastatin 20 mg.

## 2016-01-30 LAB — HEPATITIS C ANTIBODY: HCV AB: NEGATIVE

## 2016-03-24 DIAGNOSIS — R609 Edema, unspecified: Secondary | ICD-10-CM | POA: Diagnosis not present

## 2016-03-24 DIAGNOSIS — E785 Hyperlipidemia, unspecified: Secondary | ICD-10-CM | POA: Diagnosis not present

## 2016-03-24 DIAGNOSIS — Z79810 Long term (current) use of selective estrogen receptor modulators (SERMs): Secondary | ICD-10-CM | POA: Diagnosis not present

## 2016-03-24 DIAGNOSIS — I10 Essential (primary) hypertension: Secondary | ICD-10-CM | POA: Diagnosis not present

## 2016-03-24 DIAGNOSIS — Z79899 Other long term (current) drug therapy: Secondary | ICD-10-CM | POA: Diagnosis not present

## 2016-03-24 DIAGNOSIS — K219 Gastro-esophageal reflux disease without esophagitis: Secondary | ICD-10-CM | POA: Diagnosis not present

## 2016-03-28 ENCOUNTER — Other Ambulatory Visit: Payer: Self-pay | Admitting: *Deleted

## 2016-03-28 ENCOUNTER — Encounter: Payer: Self-pay | Admitting: Primary Care

## 2016-03-28 MED ORDER — GABAPENTIN 300 MG PO CAPS: 300.0000 mg | ORAL_CAPSULE | Freq: Every day | ORAL | 4 refills | Status: DC

## 2016-03-31 ENCOUNTER — Ambulatory Visit: Payer: Managed Care, Other (non HMO) | Admitting: Family Medicine

## 2016-03-31 ENCOUNTER — Other Ambulatory Visit: Payer: Self-pay | Admitting: *Deleted

## 2016-03-31 ENCOUNTER — Other Ambulatory Visit: Payer: Self-pay | Admitting: Primary Care

## 2016-03-31 MED ORDER — GABAPENTIN 300 MG PO CAPS: 300.0000 mg | ORAL_CAPSULE | Freq: Every day | ORAL | 4 refills | Status: DC

## 2016-04-01 ENCOUNTER — Other Ambulatory Visit: Payer: Self-pay | Admitting: Primary Care

## 2016-04-01 DIAGNOSIS — R000 Tachycardia, unspecified: Secondary | ICD-10-CM

## 2016-04-13 ENCOUNTER — Encounter: Payer: Self-pay | Admitting: Primary Care

## 2016-04-13 MED ORDER — SIMVASTATIN 20 MG PO TABS: 20.0000 mg | ORAL_TABLET | Freq: Every day | ORAL | 1 refills | Status: DC

## 2016-05-03 ENCOUNTER — Encounter: Payer: Self-pay | Admitting: Cardiovascular Disease

## 2016-05-03 ENCOUNTER — Ambulatory Visit (INDEPENDENT_AMBULATORY_CARE_PROVIDER_SITE_OTHER): Payer: Medicare Other | Admitting: Cardiovascular Disease

## 2016-05-03 VITALS — BP 114/71 | HR 67 | Ht 59.0 in | Wt 191.0 lb

## 2016-05-03 DIAGNOSIS — R0600 Dyspnea, unspecified: Secondary | ICD-10-CM

## 2016-05-03 DIAGNOSIS — E785 Hyperlipidemia, unspecified: Secondary | ICD-10-CM | POA: Diagnosis not present

## 2016-05-03 DIAGNOSIS — R0789 Other chest pain: Secondary | ICD-10-CM

## 2016-05-03 DIAGNOSIS — R0609 Other forms of dyspnea: Secondary | ICD-10-CM

## 2016-05-03 DIAGNOSIS — I10 Essential (primary) hypertension: Secondary | ICD-10-CM | POA: Diagnosis not present

## 2016-05-03 NOTE — Patient Instructions (Addendum)
Medication Instructions:  Your physician recommends that you continue on your current medications as directed. Please refer to the Current Medication list given to you today.  Labwork: none  Testing/Procedures: Your physician has requested that you have en exercise stress myoview. For further information please visit [email or web address]. Please follow instruction sheet, as given.  Follow-Up: Your physician recommends that you schedule a follow-up appointment in: 1 month ov  If you need a refill on your cardiac medications before your next appointment, please call your pharmacy.

## 2016-05-03 NOTE — Progress Notes (Signed)
Cardiology Office Note   Date:  05/03/2016   ID:  Laura Bush, DOB [name] 20, 1951, MRN [id number]  PCP:  Sheral Flow, NP  Cardiologist:   Skeet Latch, MD   Chief Complaint  Patient presents with  . New Patient (Initial Visit)    sob when exercrising. edema in ankles.    History of Present Illness: Laura Bush is a 65 y.o. female with breast cancer s/p lumpectomy and XRT, GERD, hypertension and hyperlipidemia who presents for an evaluation of shortness of breath.  Each year she walks in the women's only 5K race. Since last year she moved to new neighborhood and it has more inclines. She typically feels well with exercise, however she has noted that when she walks up hills she gets short of breath. By the time she gets to the top she has to stop and rest and feels as though she cannot take a deep breath.  When she gets to the top her heart rate is typically in the 90s.  She walks six days per week for 45 minutes to one hour.  She also does yoga once per week.  She has twin 39.56 year old grandchildren and is able to keep up with them.  She denies any chest pain or pressure. She notes occasional  swelling in her ankles, but this improves with elevation.  In the past she worse compression stockings, which also helped.  She hasn't noted any orthopnea or PND.    Laura Bush also reports intermittent heart palpitations.  Last night it felt like her heart was vibrating after laying down to eat.  It lasted less than 10 minutes and was not associated with lightheadedness, shortness of breath or dizziness.  Past Medical History:  Diagnosis Date  . Asthma    in cold weather  . Breast cancer ([place]) 09/18/14   left breast  . Breast cancer ([place]) 10/07/14   bx left breast  . Breast cancer of upper-outer quadrant of left female breast ([place]) 09/22/2014  . Cluster headaches    in her 40's  . GERD (gastroesophageal reflux disease)   . Heart murmur    as a child (has outgrown)  . High  cholesterol   . Hypertension   . Microscopic colitis   . OSA on CPAP   . Pneumonia 2000's X 1  . PONV (postoperative nausea and vomiting)   . Rosacea   . S/P radiation therapy 12/30/14-01/27/15   left breast 50Gy total dose  . 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 Venous insufficiency     Past Surgical History:  Procedure Laterality Date  . ABDOMINAL HYSTERECTOMY  1999  . APPENDECTOMY  ~ 2006  . BREAST BIOPSY Left 10/2014; 10/2014  . BREAST REDUCTION SURGERY Bilateral 11/11/2014   Procedure: Bilateral Breast Reduction;  Surgeon: Crissie Reese, MD;  Location: [facility];  Service: Plastics;  Laterality: Bilateral;  . CARPAL TUNNEL RELEASE Bilateral    2 surgeries on right, 1 on left  . CESAREAN SECTION  1978; 1980  . COLONOSCOPY    . FOREARM FRACTURE SURGERY Right 2003 X 3  . FRACTURE SURGERY    . KNEE ARTHROSCOPY Bilateral    meniscus repair  . MASTECTOMY COMPLETE / SIMPLE W/ SENTINEL NODE BIOPSY Left 11/11/2014  . PARTIAL MASTECTOMY WITH AXILLARY SENTINEL LYMPH NODE BIOPSY Left 11/11/2014  . PARTIAL MASTECTOMY WITH NEEDLE LOCALIZATION AND AXILLARY SENTINEL LYMPH NODE BX Left 11/11/2014   Procedure: LEFT BREAST PARTIAL MASTECTOMY WITH NEEDLE LOCALIZATION  TIMES TWO AND LEFT AXILLARY SENTINEL LYMPH NODE Biopsy;  Surgeon: Fanny Skates, MD;  Location: [facility];  Service: General;  Laterality: Left;  . REDUCTION MAMMAPLASTY Bilateral 11/11/2014  . SQUAMOUS CELL CARCINOMA EXCISION  1980's X 1   "nose"  . TONSILLECTOMY  ~ 1957  . TUBAL LIGATION  ?1984     Current Outpatient Prescriptions  Medication Sig Dispense Refill  . acetaminophen (TYLENOL) 500 MG tablet Take 500 mg by mouth every 6 (six) hours as needed.    Marland Kitchen aMILoride (MIDAMOR) 5 MG tablet Take 5 mg by mouth daily.    . B Complex-C (B-COMPLEX WITH VITAMIN C) tablet Take 1 tablet by mouth daily.    . budesonide (ENTOCORT EC) 3 MG 24 hr capsule Take 3 tablets daily. (Patient taking differently: Take 6 mg by mouth daily. Take 2 tablets  daily.) 90 capsule 3  . Cholecalciferol (VITAMIN D3) 2000 UNITS TABS Take 1 tablet by mouth daily.    Marland Kitchen gabapentin (NEURONTIN) 300 MG capsule Take 1 capsule (300 mg total) by mouth at bedtime. 90 capsule 4  . hydrochlorothiazide (MICROZIDE) 12.5 MG capsule Take 1 capsule (12.5 mg total) by mouth daily. 30 capsule 5  . Magnesium 200 MG TABS Take 1 tablet by mouth daily.    . metoprolol succinate (TOPROL-XL) 50 MG 24 hr tablet Take 50 mg by mouth daily. Take with or immediately following a meal.    . pantoprazole (PROTONIX) 20 MG tablet Take 1 tablet (20 mg total) by mouth daily. 90 tablet 2  . simvastatin (ZOCOR) 20 MG tablet Take 1 tablet (20 mg total) by mouth daily. 90 tablet 1  . tamoxifen (NOLVADEX) 20 MG tablet Take 1 tablet (20 mg total) by mouth daily. 90 tablet 2  . Telmisartan-Amlodipine 80-5 MG TABS Take 1 tablet by mouth daily.  3  . UNABLE TO FIND Please fit patient for a compression sleeve.  Diagnosis:  Bilateral breast cancer 1 each 0   No current facility-administered medications for this visit.     Allergies:   Lanolin and Tape    Social History:  The patient  reports that she has never smoked. She has never used smokeless tobacco. She reports that she drinks about 2.4 oz of alcohol per week . She reports that she does not use drugs.   Family History:  The patient's family history includes Dementia in her mother; Heart attack in her maternal grandmother; Heart failure in her father; Hyperlipidemia in her mother; Hypertension in her maternal grandfather, mother, and son; Stroke in her father and paternal grandfather; Uterine cancer (age of onset: 43) in her sister.    ROS:  Please see the history of present illness.   Otherwise, review of systems are positive for none.   All other systems are reviewed and negative.    PHYSICAL EXAM: VS:  BP 114/71   Pulse 67   Ht '4\' 11"'$  (1.499 m)   Wt 191 lb (86.6 kg)   BMI 38.58 kg/m  , BMI Body mass index is 38.58 kg/m. GENERAL:   Well appearing HEENT:  Pupils equal round and reactive, fundi not visualized, oral mucosa unremarkable NECK:  No jugular venous distention, waveform within normal limits, carotid upstroke brisk and symmetric, no bruits, no thyromegaly LYMPHATICS:  No cervical adenopathy LUNGS:  Clear to auscultation bilaterally HEART:  RRR.  PMI not displaced or sustained,S1 and S2 within normal limits, no S3, no S4, no clicks, no rubs, I/VI systolic murmur at the RUSB ABD:  Flat, positive bowel sounds normal in frequency in pitch, no bruits, no rebound, no guarding, no midline pulsatile mass, no hepatomegaly, no splenomegaly EXT:  2 plus pulses throughout, no edema, no cyanosis no clubbing SKIN:  No rashes no nodules NEURO:  Cranial nerves II through XII grossly intact, motor grossly intact throughout PSYCH:  Cognitively intact, oriented to person place and time  EKG:  EKG is ordered today. The ekg ordered today demonstrates sinus rhythm.  Rate 67 bpm.  L axis deviation.  Absent R wave progression   Recent Labs: 01/25/2016: ALT 19; BUN 15.1; Creatinine 1.0; HGB 13.0; Platelets 169; Potassium 3.5; Sodium 143    Lipid Panel    Component Value Date/Time   CHOL 184 01/29/2016 1534   TRIG 155.0 (H) 01/29/2016 1534   HDL 55.00 01/29/2016 1534   CHOLHDL 3 01/29/2016 1534   VLDL 31.0 01/29/2016 1534   LDLCALC 98 01/29/2016 1534      Wt Readings from Last 3 Encounters:  05/03/16 191 lb (86.6 kg)  01/29/16 196 lb 12.8 oz (89.3 kg)  01/25/16 197 lb 12.8 oz (89.7 kg)      ASSESSMENT AND PLAN:  # Exertional dyspnea:  Laura Bush has dyspnea when walking up an incline.  She doesn't otherwise have exertional symptoms.  She has absent R wave progression on her EKG.  We will get an exercise Myoview to assess for ischemia.  She has also noted that her heart rate does not increase much with exertion.   this could also be contributing to her exertional dyspnea. If her stress test is normal, we will consider  switching her metoprolol to carvedilol.   # Hypertension: Blood pressure is well-controlled.  Continue amiloride, HCTZ, metoprolol, telmisartan and amlodipine.   # Hyperlipidemia: Continue simvastatin.    Current medicines are reviewed at length with the patient today.  The patient does not have concerns regarding medicines.  The following changes have been made:  no change  Labs/ tests ordered today include:   Orders Placed This Encounter  Procedures  . Myocardial Perfusion Imaging  . EKG 12-Lead     Disposition:   FU with Kairi Harshbarger C. Oval Linsey, MD, [facility] in 1 month.    This note was written with the assistance of speech recognition software.  Please excuse any transcriptional errors.  Signed, Caley Ciaramitaro C. Oval Linsey, MD, [facility]  05/03/2016 6:21 PM    [place]

## 2016-05-06 ENCOUNTER — Telehealth (HOSPITAL_COMMUNITY): Payer: Self-pay

## 2016-05-06 NOTE — Telephone Encounter (Signed)
Encounter complete. 

## 2016-05-10 DIAGNOSIS — Z23 Encounter for immunization: Secondary | ICD-10-CM | POA: Diagnosis not present

## 2016-05-11 ENCOUNTER — Encounter: Payer: Self-pay | Admitting: Primary Care

## 2016-05-11 ENCOUNTER — Ambulatory Visit (HOSPITAL_COMMUNITY)
Admission: RE | Admit: 2016-05-11 | Discharge: 2016-05-11 | Disposition: A | Discharge status: Home / Self Care | Payer: Medicare Other | Source: Ambulatory Visit | Attending: Cardiovascular Disease | Admitting: Cardiovascular Disease

## 2016-05-11 DIAGNOSIS — R0789 Other chest pain: Secondary | ICD-10-CM | POA: Insufficient documentation

## 2016-05-11 DIAGNOSIS — R0600 Dyspnea, unspecified: Secondary | ICD-10-CM

## 2016-05-11 DIAGNOSIS — R0609 Other forms of dyspnea: Secondary | ICD-10-CM | POA: Insufficient documentation

## 2016-05-11 LAB — MYOCARDIAL PERFUSION IMAGING
CHL CUP NUCLEAR SDS: 1
CHL CUP NUCLEAR SRS: 0
CHL RATE OF PERCEIVED EXERTION: 17
CSEPED: 7 min
CSEPEW: 9.3 METS
Exercise duration (sec): 32 s
LV dias vol: 101 mL (ref 46–106)
LVSYSVOL: 30 mL
MPHR: 155 {beats}/min
Peak HR: 144 {beats}/min
Percent HR: 93 %
Rest HR: 55 {beats}/min
SSS: 1

## 2016-05-11 MED ORDER — TECHNETIUM TC 99M TETROFOSMIN IV KIT
10.9000 | PACK | Freq: Once | INTRAVENOUS | Status: AC | PRN
  Administered 2016-05-11: 10.9 via INTRAVENOUS
  Filled 2016-05-11: qty 11

## 2016-05-11 MED ORDER — TECHNETIUM TC 99M TETROFOSMIN IV KIT
32.0000 | PACK | Freq: Once | INTRAVENOUS | Status: AC | PRN
  Administered 2016-05-11: 32 via INTRAVENOUS
  Filled 2016-05-11: qty 32

## 2016-05-12 ENCOUNTER — Encounter: Payer: Self-pay | Admitting: Oncology

## 2016-05-18 ENCOUNTER — Other Ambulatory Visit: Payer: Self-pay

## 2016-05-18 MED ORDER — TAMOXIFEN CITRATE 20 MG PO TABS: 20.0000 mg | ORAL_TABLET | Freq: Every day | ORAL | 3 refills | Status: DC

## 2016-05-19 ENCOUNTER — Telehealth: Payer: Self-pay | Admitting: *Deleted

## 2016-05-19 ENCOUNTER — Other Ambulatory Visit: Payer: Self-pay | Admitting: *Deleted

## 2016-05-19 MED ORDER — TAMOXIFEN CITRATE 20 MG PO TABS: 20.0000 mg | ORAL_TABLET | Freq: Every day | ORAL | 3 refills | Status: DC

## 2016-05-19 NOTE — Telephone Encounter (Signed)
Patient called in regards to needing a refill on her Tamoxifen. And asked that it be sent to  [name]  [facility] in [place]

## 2016-05-23 DIAGNOSIS — L814 Other melanin hyperpigmentation: Secondary | ICD-10-CM | POA: Diagnosis not present

## 2016-05-23 DIAGNOSIS — Z85828 Personal history of other malignant neoplasm of skin: Secondary | ICD-10-CM | POA: Diagnosis not present

## 2016-05-23 DIAGNOSIS — D235 Other benign neoplasm of skin of trunk: Secondary | ICD-10-CM | POA: Diagnosis not present

## 2016-05-23 DIAGNOSIS — L820 Inflamed seborrheic keratosis: Secondary | ICD-10-CM | POA: Diagnosis not present

## 2016-05-27 ENCOUNTER — Other Ambulatory Visit: Payer: Self-pay | Admitting: *Deleted

## 2016-05-27 DIAGNOSIS — C50912 Malignant neoplasm of unspecified site of left female breast: Principal | ICD-10-CM

## 2016-05-27 DIAGNOSIS — C50911 Malignant neoplasm of unspecified site of right female breast: Secondary | ICD-10-CM

## 2016-05-30 ENCOUNTER — Telehealth: Payer: Self-pay | Admitting: Oncology

## 2016-05-30 ENCOUNTER — Ambulatory Visit (HOSPITAL_BASED_OUTPATIENT_CLINIC_OR_DEPARTMENT_OTHER): Payer: Medicare Other | Admitting: Oncology

## 2016-05-30 ENCOUNTER — Other Ambulatory Visit (HOSPITAL_BASED_OUTPATIENT_CLINIC_OR_DEPARTMENT_OTHER): Payer: Medicare Other

## 2016-05-30 VITALS — BP 135/70 | HR 49 | Temp 98.0°F | Resp 18 | Ht 59.0 in | Wt 189.2 lb

## 2016-05-30 DIAGNOSIS — C50412 Malignant neoplasm of upper-outer quadrant of left female breast: Secondary | ICD-10-CM

## 2016-05-30 DIAGNOSIS — C50912 Malignant neoplasm of unspecified site of left female breast: Principal | ICD-10-CM

## 2016-05-30 DIAGNOSIS — Z79810 Long term (current) use of selective estrogen receptor modulators (SERMs): Secondary | ICD-10-CM | POA: Diagnosis not present

## 2016-05-30 DIAGNOSIS — D0511 Intraductal carcinoma in situ of right breast: Secondary | ICD-10-CM | POA: Diagnosis not present

## 2016-05-30 DIAGNOSIS — Z170 Estrogen receptor positive status [ER+]: Secondary | ICD-10-CM | POA: Diagnosis not present

## 2016-05-30 DIAGNOSIS — C50911 Malignant neoplasm of unspecified site of right female breast: Secondary | ICD-10-CM

## 2016-05-30 LAB — CBC WITH DIFFERENTIAL/PLATELET
BASO%: 0.4 % (ref 0.0–2.0)
Basophils Absolute: 0 10*3/uL (ref 0.0–0.1)
EOS ABS: 0.1 10*3/uL (ref 0.0–0.5)
EOS%: 1.8 % (ref 0.0–7.0)
HCT: 37.4 % (ref 34.8–46.6)
HEMOGLOBIN: 12.6 g/dL (ref 11.6–15.9)
LYMPH%: 24.2 % (ref 14.0–49.7)
MCH: 33.1 pg (ref 25.1–34.0)
MCHC: 33.7 g/dL (ref 31.5–36.0)
MCV: 98.2 fL (ref 79.5–101.0)
MONO#: 0.6 10*3/uL (ref 0.1–0.9)
MONO%: 12.3 % (ref 0.0–14.0)
NEUT%: 61.3 % (ref 38.4–76.8)
NEUTROS ABS: 2.7 10*3/uL (ref 1.5–6.5)
Platelets: 161 10*3/uL (ref 145–400)
RBC: 3.81 10*6/uL (ref 3.70–5.45)
RDW: 12.3 % (ref 11.2–14.5)
WBC: 4.5 10*3/uL (ref 3.9–10.3)
lymph#: 1.1 10*3/uL (ref 0.9–3.3)

## 2016-05-30 LAB — COMPREHENSIVE METABOLIC PANEL
ALBUMIN: 3.7 g/dL (ref 3.5–5.0)
ALK PHOS: 40 U/L (ref 40–150)
ALT: 24 U/L (ref 0–55)
AST: 19 U/L (ref 5–34)
Anion Gap: 8 mEq/L (ref 3–11)
BILIRUBIN TOTAL: 0.39 mg/dL (ref 0.20–1.20)
BUN: 13.2 mg/dL (ref 7.0–26.0)
CO2: 26 mEq/L (ref 22–29)
Calcium: 9.5 mg/dL (ref 8.4–10.4)
Chloride: 107 mEq/L (ref 98–109)
Creatinine: 0.8 mg/dL (ref 0.6–1.1)
EGFR: 75 mL/min/{1.73_m2} — ABNORMAL LOW (ref >90)
GLUCOSE: 93 mg/dL (ref 70–140)
Potassium: 4.2 mEq/L (ref 3.5–5.1)
SODIUM: 141 meq/L (ref 136–145)
TOTAL PROTEIN: 6.7 g/dL (ref 6.4–8.3)

## 2016-05-30 NOTE — Telephone Encounter (Signed)
Pt confirmed appt, received avs,  Pt will  Schedule MRI in 2018

## 2016-05-30 NOTE — Progress Notes (Signed)
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 While still there, she had routine screening mammography with tomography 02/14/2014, which showed only a tiny breast cysts. Six-month follow-up exam was performed 08/25/2014, and now there was evidence of a possible focal asymmetry in the left breast. On 08/25/2014 at the [facility] of radiology she underwent a unilateral diagnostic mammography with tomography , and this confirmed a spiculated mass in the left breast superiorly. Sonography of this area found a poorly defined hypoechoic mass measuring 0.7 cm at the 11:00 position 8 cm from the nipple. There was also an adjacent cluster of cysts with a benign circumcised appearance.   Biopsy of the mass in question was recommended, but as the patient was moving to this area, this was performed here on 09/18/2014. The pathology from that procedure (SAA [id number]) found an invasive ductal carcinoma, grade 2, estrogen receptor 100% positive, with strong staining intensity, progesterone receptor 43% positive, with strong staining intensity, with an MIB-1 of 30%, and no HER-2  amplification.    on 09/26/2014 the patient underwent bilateral breast MRI at [facility] imaging, showing a breast density category B. The right breast was unremarkable and there were no lymph nodes of concern. In the upper inner quadrant of the left breast there was an enhancing mass measuring 1.2 cm. In addition there was a 5.5 cm area of linear enhancement extending anteriorly from this mass. 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   The patient's subsequent history is as detailed below  INTERVAL HISTORY: Laura Bush returns today for follow-up of her invasive and noninvasive breast cancers. She tolerates tamoxifen well, without hot flashes or significant vaginal wetness problems. She obtains it at a good price.  REVIEW OF SYSTEMS: Laura Bush continues to have right shoulder pain. This is intermittent. It is partly positional. She has been having some ankle swelling at times and can be short of breath when climbing stairs. She had a stress test however which was completely normal. She has some joint pains here and there as well. She has recently been started on blood pressure medication. Aside from these issues a detailed review of systems today was stable   PAST MEDICAL HISTORY: Past Medical History:  Diagnosis Date  . Asthma    in cold weather  . Breast cancer ([facility]) 09/18/14   left breast  . Breast cancer ([place]) 10/07/14   bx left breast  . Breast cancer of upper-outer quadrant of left female breast ([facility]) 09/22/2014  . Cluster headaches    in her 65's  . GERD (gastroesophageal reflux disease)   . Heart murmur    as a child (has outgrown)  . High cholesterol   . Hypertension   . Microscopic colitis   . OSA  on CPAP   . Pneumonia 2000's X 1  . PONV (postoperative nausea and vomiting)   . Rosacea   . S/P radiation therapy 12/30/14-01/27/15   left breast 50Gy total dose  . Squamous carcinoma 65's   "nose"  . Venous insufficiency     PAST SURGICAL HISTORY: Past Surgical  History:  Procedure Laterality Date  . ABDOMINAL HYSTERECTOMY  1999  . APPENDECTOMY  ~ 2006  . BREAST BIOPSY Left 10/2014; 10/2014  . BREAST REDUCTION SURGERY Bilateral 11/11/2014   Procedure: Bilateral Breast Reduction;  Surgeon: Crissie Reese, MD;  Location: [facility];  Service: Plastics;  Laterality: Bilateral;  . CARPAL TUNNEL RELEASE Bilateral    2 surgeries on right, 1 on left  . CESAREAN SECTION  1978; 1980  . COLONOSCOPY    . FOREARM FRACTURE SURGERY Right 2003 X 3  . FRACTURE SURGERY    . KNEE ARTHROSCOPY Bilateral    meniscus repair  . MASTECTOMY COMPLETE / SIMPLE W/ SENTINEL NODE BIOPSY Left 11/11/2014  . PARTIAL MASTECTOMY WITH AXILLARY SENTINEL LYMPH NODE BIOPSY Left 11/11/2014  . PARTIAL MASTECTOMY WITH NEEDLE LOCALIZATION AND AXILLARY SENTINEL LYMPH NODE BX Left 11/11/2014   Procedure: LEFT BREAST PARTIAL MASTECTOMY WITH NEEDLE LOCALIZATION TIMES TWO AND LEFT AXILLARY SENTINEL LYMPH NODE Biopsy;  Surgeon: Fanny Skates, MD;  Location: [facility];  Service: General;  Laterality: Left;  . REDUCTION MAMMAPLASTY Bilateral 11/11/2014  . SQUAMOUS CELL CARCINOMA EXCISION  1980's X 1   "nose"  . TONSILLECTOMY  ~ 1957  . TUBAL LIGATION  ?1984    FAMILY HISTORY Family History  Problem Relation Age of Onset  . Uterine cancer Sister 68  . Stroke Father     deceased  . Heart failure Father   . Dementia Mother     Lives in [place]  . Hypertension Mother   . Hyperlipidemia Mother   . Heart attack Maternal Grandmother   . Hypertension Maternal Grandfather   . Stroke Paternal Grandfather   . Hypertension Son    the patient's father died at the age of 13 following a stroke. The patient's mother is still living, age 60. GEN had no brothers, one sister. That sister was diagnosed with uterine cancer at the age of 95. There is no other history of breast or ovarian cancer in the family to the patient's knowledge  GYNECOLOGIC HISTORY:  No LMP recorded. Patient has had a hysterectomy.  menarche age  65, first live birth age 65, the patient is [place] P2.  She underwent total abdominal hysterectomy with bilateral salpingo-oophorectomy in 1999. She took hormone replacement for approximately 4 years. 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 Their daughter Luane School lives in [place] where she works as an Web designer. Their son Robert Bush lives in [place] where he works as a Education officer, museum. Herbie Baltimore is expecting twins.    ADVANCED DIRECTIVES:  Not in place. During the 12/03/2014 visited the patient was given the appropriate forms to complete and notarize at her discretion.   HEALTH MAINTENANCE: Social History  Substance Use Topics  . Smoking status: Never Smoker  . Smokeless tobacco: Never Used  . Alcohol use 2.4 oz/week    4 Glasses of wine per week     Colonoscopy:  2008  PAP:  Bone density: 2007/"normal"  Lipid panel:  Allergies  Allergen Reactions  .  Lanolin   . Tape Other (See Comments)    Redness (Bandaids also)    Current Outpatient Prescriptions  Medication Sig Dispense Refill  . acetaminophen (TYLENOL) 500 MG tablet Take 500 mg by mouth every 6 (six) hours as needed.    Marland Kitchen aMILoride (MIDAMOR) 5 MG tablet Take 5 mg by mouth daily.    . B Complex-C (B-COMPLEX WITH VITAMIN C) tablet Take 1 tablet by mouth daily.    . budesonide (ENTOCORT EC) 3 MG 24 hr capsule Take 3 tablets daily. (Patient taking differently: Take 6 mg by mouth daily. Take 2 tablets daily.) 90 capsule 3  . Cholecalciferol (VITAMIN D3) 2000 UNITS TABS Take 1 tablet by mouth daily.    Marland Kitchen gabapentin (NEURONTIN) 300 MG capsule Take 1 capsule (300 mg total) by mouth at bedtime. 90 capsule 4  . hydrochlorothiazide (MICROZIDE) 12.5 MG capsule Take 1 capsule (12.5 mg total) by mouth daily. 30 capsule 5  . Magnesium 200 MG TABS Take 1  tablet by mouth daily.    . metoprolol succinate (TOPROL-XL) 50 MG 24 hr tablet Take 50 mg by mouth daily. Take with or immediately following a meal.    . pantoprazole (PROTONIX) 20 MG tablet Take 1 tablet (20 mg total) by mouth daily. 90 tablet 2  . simvastatin (ZOCOR) 20 MG tablet Take 1 tablet (20 mg total) by mouth daily. 90 tablet 1  . tamoxifen (NOLVADEX) 20 MG tablet Take 1 tablet (20 mg total) by mouth daily. 90 tablet 3  . Telmisartan-Amlodipine 80-5 MG TABS Take 1 tablet by mouth daily.  3  . UNABLE TO FIND Please fit patient for a compression sleeve.  Diagnosis:  Bilateral breast cancer 1 each 0   No current facility-administered medications for this visit.     OBJECTIVE:  Middle-aged white woman Who appears well Vitals:   05/30/16 1147  BP: 135/70  Pulse: (!) 49  Resp: 18  Temp: 98 F (36.7 C)     Body mass index is 38.21 kg/m.    ECOG FS:1 - Symptomatic but completely ambulatory  Sclerae unicteric, EOMs intact Oropharynx clear and moist No cervical or supraclavicular adenopathy Lungs no rales or rhonchi Heart regular rate and rhythm Abd soft, nontender, positive bowel sounds MSK no focal spinal tenderness, no upper extremity lymphedema Neuro: nonfocal, well oriented, appropriate affect Breasts: The right breast is unremarkable except that it is status post reduction mammoplasty. There is no evidence of DCIS recurrence. The right axilla is benign. The left breast is status post lumpectomy and radiation. There is no evidence of local recurrence. Left axilla is benign.   LAB RESULTS:  CMP     Component Value Date/Time   NA 141 05/30/2016 1111   K 4.2 05/30/2016 1111   CL 103 04/30/2015 1424   CO2 26 05/30/2016 1111   GLUCOSE 93 05/30/2016 1111   BUN 13.2 05/30/2016 1111   CREATININE 0.8 05/30/2016 1111   CALCIUM 9.5 05/30/2016 1111   PROT 6.7 05/30/2016 1111   ALBUMIN 3.7 05/30/2016 1111   AST 19 05/30/2016 1111   ALT 24 05/30/2016 1111   ALKPHOS 40  05/30/2016 1111   BILITOT 0.39 05/30/2016 1111   GFRNONAA 68 (L) 11/05/2014 1142   GFRAA 78 (L) 11/05/2014 1142    INo results found for: SPEP, UPEP  Lab Results  Component Value Date   WBC 4.5 05/30/2016   NEUTROABS 2.7 05/30/2016   HGB 12.6 05/30/2016   HCT 37.4 05/30/2016   MCV 98.2 05/30/2016  PLT 161 05/30/2016      Chemistry      Component Value Date/Time   NA 141 05/30/2016 1111   K 4.2 05/30/2016 1111   CL 103 04/30/2015 1424   CO2 26 05/30/2016 1111   BUN 13.2 05/30/2016 1111   CREATININE 0.8 05/30/2016 1111      Component Value Date/Time   CALCIUM 9.5 05/30/2016 1111   ALKPHOS 40 05/30/2016 1111   AST 19 05/30/2016 1111   ALT 24 05/30/2016 1111   BILITOT 0.39 05/30/2016 1111       No results found for: LABCA2  No components found for: LABCA125  No results for input(s): INR in the last 168 hours.  Urinalysis    Component Value Date/Time   COLORURINE YELLOW 11/05/2014 1142   APPEARANCEUR CLEAR 11/05/2014 1142   LABSPEC 1.008 11/05/2014 1142   PHURINE 7.0 11/05/2014 1142   GLUCOSEU NEGATIVE 11/05/2014 1142   HGBUR NEGATIVE 11/05/2014 1142   BILIRUBINUR NEGATIVE 11/05/2014 1142   KETONESUR NEGATIVE 11/05/2014 1142   PROTEINUR NEGATIVE 11/05/2014 1142   UROBILINOGEN 0.2 11/05/2014 1142   NITRITE NEGATIVE 11/05/2014 1142   LEUKOCYTESUR NEGATIVE 11/05/2014 1142    STUDIES: No results found.  ASSESSMENT: 65 y.o. [place], [place] woman status post left breast upper outer quadrant biopsy 09/18/2014 for a clinical T1 cN0 invasive ductal carcinoma, grade 2, estrogen and progesterone receptor positive, HER-2 not amplified, with an MIB-1 of 30%  (1) there is a 5.5 cm area of non-masslike enhancement seen in the left breast with biopsy 10/07/2014 showing atypical ductal hyperplasia  (2) status post left lumpectomy and sentinel lymph node sampling 11/11/2014 for a  pT1c  pN0, stage IA invasive ductal carcinoma, grade 3, repeat HER-2 again negative, with  negative margins  (3) an Oncotype DX score of 11 predicts an 8% risk of outside the breast recurrence within 10 years if the patient's only systemic therapy is tamoxifen for 5 years. It also predicts no benefit from chemotherapy  (4) Pathology from right reduction mammoplasty 11/11/2014 unexpectedly showed ductal carcinoma in situ, estrogen receptor 100% positive, progesterone receptor 71% positive  (5) adjuvant radiation completed 01/27/2015  (6) anastrozole started June 2016, stopped 05/28/15 due to intolerance  (a) bone density 02/17/2015 was normal with a T score of -0.9  (7) tamoxifen started 07/14/15  (8) because of the incomplete resection on the right breast, bilateral yearly breast MRI are recommended, in addition to mammography.  (a) bilateral breast MRI 10/16/2015 was negative  PLAN: Laura Bush is now a year and a half out from definitive surgery for invasive breast cancer with no evidence of disease recurrence. This is favorable.  She is tolerating the tamoxifen well and the plan will be to continue that for a total of 5 years.  Because her DCIS was only found incidentally and never formally "cleared", she is undergoing yearly breast MRI, with the next one scheduled for March 2017.  I offered her referral to orthopedics for her right shoulder pain but she prefers to continue to treated supportively.  She will return to see me in one year. She knows to call for any problems that may develop before that visit.  Chauncey Cruel, MD   05/30/2016 6:54 PM

## 2016-06-03 ENCOUNTER — Ambulatory Visit (INDEPENDENT_AMBULATORY_CARE_PROVIDER_SITE_OTHER): Payer: Medicare Other | Admitting: Cardiovascular Disease

## 2016-06-03 ENCOUNTER — Encounter: Payer: Self-pay | Admitting: Cardiovascular Disease

## 2016-06-03 VITALS — BP 127/74 | HR 55 | Ht 59.0 in | Wt 187.2 lb

## 2016-06-03 DIAGNOSIS — R0602 Shortness of breath: Secondary | ICD-10-CM

## 2016-06-03 DIAGNOSIS — I10 Essential (primary) hypertension: Secondary | ICD-10-CM | POA: Diagnosis not present

## 2016-06-03 DIAGNOSIS — E7800 Pure hypercholesterolemia, unspecified: Secondary | ICD-10-CM

## 2016-06-03 MED ORDER — CARVEDILOL 12.5 MG PO TABS: 12.5000 mg | ORAL_TABLET | Freq: Two times a day (BID) | ORAL | 5 refills | Status: DC

## 2016-06-03 NOTE — Progress Notes (Signed)
Cardiology Office Note   Date:  06/05/2016   ID:  Laura Bush, DOB June 18, 1951, MRN [id number]  PCP:  Sheral Flow, NP  Cardiologist:   Skeet Latch, MD   Chief Complaint  Patient presents with  . Follow-up    1 month; edema; in feet and ankles.    History of Present Illness: Laura Bush is a 65 y.o. female with breast cancer s/p lumpectomy and XRT, GERD, hypertension and hyperlipidemia who presents for follow up on shortness of breath.  She was seen 04/2016 with a report of progressive exertional dyspnea. It is especially prominent when walking up an incline. She was referred for an exercise Myoview that was negative for ischemia and revealed LVEF 71%.  She also noted that her heart rate only gets into the 90s with exertion. She wonders if this could be related to her exertional shortness of breath.Since her last appointment she has been feeling well. She participated in the woman's only 5K race and completed 10 minutes passed within her time last year. She continues to exercise regularly and notes that her shortness of breath when up inclines is improving. She does still have lower extremity edema in bilateral legs. She has not been wearing compression stockings. It improved somewhat with elevation. She denies orthopnea or PND. She also has not noted any chest pain.   Past Medical History:  Diagnosis Date  . Asthma    in cold weather  . Breast cancer ([place]) 09/18/14   left breast  . Breast cancer ([place]) 10/07/14   bx left breast  . Breast cancer of upper-outer quadrant of left female breast ([place]) 09/22/2014  . Cluster headaches    in her 40's  . GERD (gastroesophageal reflux disease)   . Heart murmur    as a child (has outgrown)  . High cholesterol   . Hypertension   . Microscopic colitis   . OSA on CPAP   . Pneumonia 2000's X 1  . PONV (postoperative nausea and vomiting)   . Rosacea   . S/P radiation therapy 12/30/14-01/27/15   left breast 50Gy total dose    . 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 Venous insufficiency     Past Surgical History:  Procedure Laterality Date  . ABDOMINAL HYSTERECTOMY  1999  . APPENDECTOMY  ~ 2006  . BREAST BIOPSY Left 10/2014; 10/2014  . BREAST REDUCTION SURGERY Bilateral 11/11/2014   Procedure: Bilateral Breast Reduction;  Surgeon: Crissie Reese, MD;  Location: [facility];  Service: Plastics;  Laterality: Bilateral;  . CARPAL TUNNEL RELEASE Bilateral    2 surgeries on right, 1 on left  . CESAREAN SECTION  1978; 1980  . COLONOSCOPY    . FOREARM FRACTURE SURGERY Right 2003 X 3  . FRACTURE SURGERY    . KNEE ARTHROSCOPY Bilateral    meniscus repair  . MASTECTOMY COMPLETE / SIMPLE W/ SENTINEL NODE BIOPSY Left 11/11/2014  . PARTIAL MASTECTOMY WITH AXILLARY SENTINEL LYMPH NODE BIOPSY Left 11/11/2014  . PARTIAL MASTECTOMY WITH NEEDLE LOCALIZATION AND AXILLARY SENTINEL LYMPH NODE BX Left 11/11/2014   Procedure: LEFT BREAST PARTIAL MASTECTOMY WITH NEEDLE LOCALIZATION TIMES TWO AND LEFT AXILLARY SENTINEL LYMPH NODE Biopsy;  Surgeon: Fanny Skates, MD;  Location: [facility];  Service: General;  Laterality: Left;  . REDUCTION MAMMAPLASTY Bilateral 11/11/2014  . SQUAMOUS CELL CARCINOMA EXCISION  1980's X 1   "nose"  . TONSILLECTOMY  ~ 1957  . TUBAL LIGATION  ?1984     Current Outpatient Prescriptions  Medication Sig Dispense Refill  . acetaminophen (TYLENOL) 500 MG tablet Take 500 mg by mouth every 6 (six) hours as needed.    Marland Kitchen aMILoride (MIDAMOR) 5 MG tablet Take 5 mg by mouth daily.    . B Complex-C (B-COMPLEX WITH VITAMIN C) tablet Take 1 tablet by mouth daily.    . budesonide (ENTOCORT EC) 3 MG 24 hr capsule Take 3 tablets daily. (Patient taking differently: Take 6 mg by mouth daily. Take 2 tablets daily.) 90 capsule 3  . Cholecalciferol (VITAMIN D3) 2000 UNITS TABS Take 1 tablet by mouth daily.    Marland Kitchen gabapentin (NEURONTIN) 300 MG capsule Take 1 capsule (300 mg total) by mouth at bedtime. 90 capsule 4  . hydrochlorothiazide  (MICROZIDE) 12.5 MG capsule Take 1 capsule (12.5 mg total) by mouth daily. 30 capsule 5  . Magnesium 200 MG TABS Take 1 tablet by mouth daily.    . pantoprazole (PROTONIX) 20 MG tablet Take 1 tablet (20 mg total) by mouth daily. 90 tablet 2  . simvastatin (ZOCOR) 20 MG tablet Take 1 tablet (20 mg total) by mouth daily. 90 tablet 1  . tamoxifen (NOLVADEX) 20 MG tablet Take 1 tablet (20 mg total) by mouth daily. 90 tablet 3  . Telmisartan-Amlodipine 80-5 MG TABS Take 1 tablet by mouth daily.  3  . UNABLE TO FIND Please fit patient for a compression sleeve.  Diagnosis:  Bilateral breast cancer 1 each 0  . carvedilol (COREG) 12.5 MG tablet Take 1 tablet (12.5 mg total) by mouth 2 (two) times daily. 60 tablet 5   No current facility-administered medications for this visit.     Allergies:   Lanolin and Tape    Social History:  The patient  reports that she has never smoked. She has never used smokeless tobacco. She reports that she drinks about 2.4 oz of alcohol per week . She reports that she does not use drugs.   Family History:  The patient's family history includes Dementia in her mother; Heart attack in her maternal grandmother; Heart failure in her father; Hyperlipidemia in her mother; Hypertension in her maternal grandfather, mother, and son; Stroke in her father and paternal grandfather; Uterine cancer (age of onset: 1) in her sister.    ROS:  Please see the history of present illness.   Otherwise, review of systems are positive for none.   All other systems are reviewed and negative.    PHYSICAL EXAM: VS:  BP 127/74   Pulse (!) 55   Ht '4\' 11"'$  (1.499 m)   Wt 84.9 kg (187 lb 3.2 oz)   BMI 37.81 kg/m  , BMI Body mass index is 37.81 kg/m. GENERAL:  Well appearing HEENT:  Pupils equal round and reactive, fundi not visualized, oral mucosa unremarkable NECK:  No jugular venous distention, waveform within normal limits, carotid upstroke brisk and symmetric, no bruits LYMPHATICS:  No  cervical adenopathy LUNGS:  Clear to auscultation bilaterally HEART:  RRR.  PMI not displaced or sustained,S1 and S2 within normal limits, no S3, no S4, no clicks, no rubs, I/VI systolic murmur at the RUSB ABD:  Flat, positive bowel sounds normal in frequency in pitch, no bruits, no rebound, no guarding, no midline pulsatile mass, no hepatomegaly, no splenomegaly EXT:  2 plus pulses throughout, no edema, no cyanosis no clubbing SKIN:  No rashes no nodules NEURO:  Cranial nerves II through XII grossly intact, motor grossly intact throughout PSYCH:  Cognitively intact, oriented to person place and time  EKG:  EKG is not ordered today. The ekg ordered 05/03/16 demonstrates sinus rhythm.  Rate 67 bpm.  L axis deviation.  Absent R wave progression  Exercise Myoview 05/11/16:   The left ventricular ejection fraction is hyperdynamic (>65%).  Nuclear stress EF: 71%.  Blood pressure demonstrated a normal response to exercise.  There was no ST segment deviation noted during stress.  The study is normal.   Normal stress nuclear study with no ischemia or infarction; EF 71 with normal wall motion.    Recent Labs: 05/30/2016: ALT 24; BUN 13.2; Creatinine 0.8; HGB 12.6; Platelets 161; Potassium 4.2; Sodium 141    Lipid Panel    Component Value Date/Time   CHOL 184 01/29/2016 1534   TRIG 155.0 (H) 01/29/2016 1534   HDL 55.00 01/29/2016 1534   CHOLHDL 3 01/29/2016 1534   VLDL 31.0 01/29/2016 1534   LDLCALC 98 01/29/2016 1534      Wt Readings from Last 3 Encounters:  06/03/16 84.9 kg (187 lb 3.2 oz)  05/30/16 85.8 kg (189 lb 3.2 oz)  05/11/16 86.6 kg (191 lb)      ASSESSMENT AND PLAN:  # Exertional dyspnea:  [name] has dyspnea when walking up an incline that has improved with training.  Her stress test was negative for ischemia.  Her heart rate continues to be low with exertion and she thnks that this limits her ability to exercise.  We will switch metoprolol to  carvedilol.  # Hypertension: Blood pressure is well-controlled.  She has problems with lower extremity edema and exertional dyspnea that she thinks may be related to bradycardia.  We will stop metoprolol and start carvedilol 12.'5mg'$  bid.  If her BP is low to well-controlled, we will stop amlodipine to see if this helps.  Continue amiloride, HCTZ, telmisartan and amlodipine.   # Hyperlipidemia: Continue simvastatin.    Current medicines are reviewed at length with the patient today.  The patient does not have concerns regarding medicines.  The following changes have been made:  no change  Labs/ tests ordered today include:   No orders of the defined types were placed in this encounter.    Disposition:   FU with Shadie Sweatman C. Oval Linsey, MD, [facility] in 2 months.    This note was written with the assistance of speech recognition software.  Please excuse any transcriptional errors.  Signed, Redford Behrle C. Oval Linsey, MD, [facility]  06/05/2016 1:30 PM    [facility]

## 2016-06-03 NOTE — Patient Instructions (Signed)
Medication Instructions:  STOP METOPROLOL   START CARVEDILOL 12.5 MG TWICE A DAY  Labwork: NONE  Testing/Procedures: NONE  Follow-Up: Your physician recommends that you schedule a follow-up appointment in: [place] D   Your physician recommends that you schedule a follow-up appointment in: 2 [place]   If you need a refill on your cardiac medications before your next appointment, please call your pharmacy.

## 2016-06-22 ENCOUNTER — Telehealth: Payer: Self-pay

## 2016-06-22 DIAGNOSIS — C50911 Malignant neoplasm of unspecified site of right female breast: Secondary | ICD-10-CM

## 2016-06-22 NOTE — Telephone Encounter (Signed)
Kathy at GI called stating this was 3rd attempt to get order for breast MRI corrected to breast bilat w/without contrast. Corrected order placed.

## 2016-06-27 ENCOUNTER — Ambulatory Visit (INDEPENDENT_AMBULATORY_CARE_PROVIDER_SITE_OTHER): Payer: Medicare Other | Admitting: Pharmacist

## 2016-06-27 ENCOUNTER — Encounter: Payer: Self-pay | Admitting: Pharmacist

## 2016-06-27 VITALS — BP 112/74 | HR 61

## 2016-06-27 DIAGNOSIS — I10 Essential (primary) hypertension: Secondary | ICD-10-CM

## 2016-06-27 MED ORDER — TELMISARTAN 80 MG PO TABS: 80.0000 mg | ORAL_TABLET | Freq: Every day | ORAL | 1 refills | Status: DC

## 2016-06-27 NOTE — Patient Instructions (Signed)
Return for a follow up appointment as schedule with Dr. Oval Linsey in Dec  Check your blood pressure at home daily (if able) and keep record of the readings.  Take your BP meds as follows: STOP telmisartan/amlodipine  START telmisartan '80mg'$  once daily   Bring all of your meds, your BP cuff and your record of home blood pressures to your next appointment.  Exercise as you're able, try to walk approximately 30 minutes per day.  Keep salt intake to a minimum, especially watch canned and prepared boxed foods.  Eat more fresh fruits and vegetables and fewer canned items.  Avoid eating in fast food restaurants.    HOW TO TAKE YOUR BLOOD PRESSURE: . Rest 5 minutes before taking your blood pressure. .  Don't smoke or drink caffeinated beverages for at least 30 minutes before. . Take your blood pressure before (not after) you eat. . Sit comfortably with your back supported and both feet on the floor (don't cross your legs). . Elevate your arm to heart level on a table or a desk. . Use the proper sized cuff. It should fit smoothly and snugly around your bare upper arm. There should be enough room to slip a fingertip under the cuff. The bottom edge of the cuff should be 1 inch above the crease of the elbow. . Ideally, take 3 measurements at one sitting and record the average.

## 2016-06-27 NOTE — Progress Notes (Signed)
Patient ID: Laura Bush                 DOB: [birth year]                      MRN: [id number]     HPI: Laura Bush is a 65 y.o. female patient of Dr. Oval Linsey with PMH below who presents today for hypertension evaluation. She was recently seen by Dr. Oval Linsey and her metoprolol was changed to carvedilol due to low HR with exertion. Dr.[place] suggested at this visit that if pressures remain low-normal she could stop amlodipine as well.   She reports doing well with change to carvedilol. No issues with dizziness or lethargy. She states she has lost about 10 pounds since joining [employer] and is working hard to lose more weight.   She reports she previously had seen a hypertension specialist that created her current "cocktail" of blood pressure medications. Her hope is that she can continue to wean off medications as she loses weight.    Cardiac Hx: Htn, HLD, asthma, Breast cancer - lumpectomy in left breast, OSA  Current HTN meds:  Hydrochlorothiazide 12.'5mg'$  daily telmisartan-amlodipine 80/'5mg'$  Carvedilol 12.'5mg'$  BID Amiloride '5mg'$  daily  Family History: Her mother had hypertension and hyperlipidemia. Her father had hypertension and cardiomyopathy and passed from a stroke at age 75.   Social History: She denies tobacco products. She endorses a glass of wine several times per week.   Diet: She eats most her meals from home. She endorses cooking with seasonings and very rarely salt. She has 1 cup of coffee per day and occasionally will drink a second cup in the late morning. She drinks mostly decaf tea and rarely will indulge in a soda.   Exercise: She has been walking for about 45 minutes per day, but this has decreased since it has gotten cold. She is trying to figure out how best to get her exercise in with the change in weather.   Home BP readings:  HR 50s-60s BP range 102-140/62-82, most readings 110s/70s  Her cuff measures 126/68 today in office.    Wt Readings from  Last 3 Encounters:  06/03/16 187 lb 3.2 oz (84.9 kg)  05/30/16 189 lb 3.2 oz (85.8 kg)  05/11/16 191 lb (86.6 kg)   BP Readings from Last 3 Encounters:  06/27/16 112/74  06/03/16 127/74  05/30/16 135/70   Pulse Readings from Last 3 Encounters:  06/27/16 61  06/03/16 (!) 55  05/30/16 (!) 49    Renal function: CrCl cannot be calculated (Patient's most recent lab result is older than the maximum 21 days allowed.).  Past Medical History:  Diagnosis Date  . Asthma    in cold weather  . Breast cancer ([place]) 09/18/14   left breast  . Breast cancer ([place]) 10/07/14   bx left breast  . Breast cancer of upper-outer quadrant of left female breast ([place]) 09/22/2014  . Cluster headaches    in her 40's  . GERD (gastroesophageal reflux disease)   . Heart murmur    as a child (has outgrown)  . High cholesterol   . Hypertension   . Microscopic colitis   . OSA on CPAP   . Pneumonia 2000's X 1  . PONV (postoperative nausea and vomiting)   . Rosacea   . S/P radiation therapy 12/30/14-01/27/15   left breast 50Gy total dose  . 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 Venous insufficiency  Current Outpatient Prescriptions on File Prior to Visit  Medication Sig Dispense Refill  . acetaminophen (TYLENOL) 500 MG tablet Take 500 mg by mouth every 6 (six) hours as needed.    Marland Kitchen aMILoride (MIDAMOR) 5 MG tablet Take 5 mg by mouth daily.    . B Complex-C (B-COMPLEX WITH VITAMIN C) tablet Take 1 tablet by mouth daily.    . budesonide (ENTOCORT EC) 3 MG 24 hr capsule Take 3 tablets daily. (Patient taking differently: Take 6 mg by mouth daily. Take 2 tablets daily.) 90 capsule 3  . carvedilol (COREG) 12.5 MG tablet Take 1 tablet (12.5 mg total) by mouth 2 (two) times daily. 60 tablet 5  . Cholecalciferol (VITAMIN D3) 2000 UNITS TABS Take 1 tablet by mouth daily.    Marland Kitchen gabapentin (NEURONTIN) 300 MG capsule Take 1 capsule (300 mg total) by mouth at bedtime. 90 capsule 4  . hydrochlorothiazide (MICROZIDE)  12.5 MG capsule Take 1 capsule (12.5 mg total) by mouth daily. 30 capsule 5  . Magnesium 200 MG TABS Take 1 tablet by mouth daily.    . pantoprazole (PROTONIX) 20 MG tablet Take 1 tablet (20 mg total) by mouth daily. (Patient taking differently: Take 20 mg by mouth daily as needed. ) 90 tablet 2  . simvastatin (ZOCOR) 20 MG tablet Take 1 tablet (20 mg total) by mouth daily. 90 tablet 1  . tamoxifen (NOLVADEX) 20 MG tablet Take 1 tablet (20 mg total) by mouth daily. 90 tablet 3  . UNABLE TO FIND Please fit patient for a compression sleeve.  Diagnosis:  Bilateral breast cancer 1 each 0   No current facility-administered medications on file prior to visit.     Allergies  Allergen Reactions  . Lanolin   . Tape Other (See Comments)    Redness (Bandaids also)    Blood pressure 112/74, pulse 61, SpO2 98 %.   Assessment/Plan: Hypertension: BP today remains low-normal. Will stop amlodipine as suggested by Dr. Oval Linsey to see if this will help with lower heart rates.She will start telmisartan '80mg'$  without amlodipine.She will start telmisartan '80mg'$  without amlodipine. Have asked she keep a record of BP and HR and bring with her to follow up with Dr. Oval Linsey in December. Congratulated her on continued weight loss. Encouraged continued exercise and weight loss.    Thank you, Lelan Pons. Patterson Hammersmith, [facility]  06/27/2016 1:47 PM

## 2016-07-14 ENCOUNTER — Ambulatory Visit
Admission: RE | Admit: 2016-07-14 | Discharge: 2016-07-14 | Disposition: A | Discharge status: Home / Self Care | Payer: Medicare Other | Source: Ambulatory Visit | Attending: Oncology | Admitting: Oncology

## 2016-07-14 DIAGNOSIS — D0511 Intraductal carcinoma in situ of right breast: Secondary | ICD-10-CM | POA: Diagnosis not present

## 2016-07-14 DIAGNOSIS — C50911 Malignant neoplasm of unspecified site of right female breast: Secondary | ICD-10-CM

## 2016-07-14 MED ORDER — GADOBENATE DIMEGLUMINE 529 MG/ML IV SOLN
17.0000 mL | Freq: Once | INTRAVENOUS | Status: AC | PRN
  Administered 2016-07-14: 17 mL via INTRAVENOUS

## 2016-07-15 ENCOUNTER — Other Ambulatory Visit: Payer: Self-pay | Admitting: Oncology

## 2016-07-15 DIAGNOSIS — C50912 Malignant neoplasm of unspecified site of left female breast: Principal | ICD-10-CM

## 2016-07-15 DIAGNOSIS — C50911 Malignant neoplasm of unspecified site of right female breast: Secondary | ICD-10-CM

## 2016-08-03 ENCOUNTER — Ambulatory Visit (INDEPENDENT_AMBULATORY_CARE_PROVIDER_SITE_OTHER): Payer: Medicare Other | Admitting: Cardiovascular Disease

## 2016-08-03 ENCOUNTER — Encounter: Payer: Self-pay | Admitting: Cardiovascular Disease

## 2016-08-03 VITALS — BP 112/73 | HR 59 | Ht 59.0 in | Wt 186.8 lb

## 2016-08-03 DIAGNOSIS — R0602 Shortness of breath: Secondary | ICD-10-CM | POA: Diagnosis not present

## 2016-08-03 DIAGNOSIS — E7800 Pure hypercholesterolemia, unspecified: Secondary | ICD-10-CM

## 2016-08-03 DIAGNOSIS — I10 Essential (primary) hypertension: Secondary | ICD-10-CM

## 2016-08-03 MED ORDER — CARVEDILOL 12.5 MG PO TABS: 12.5000 mg | ORAL_TABLET | Freq: Two times a day (BID) | ORAL | 3 refills | Status: DC

## 2016-08-03 MED ORDER — TELMISARTAN 80 MG PO TABS: 80.0000 mg | ORAL_TABLET | Freq: Every day | ORAL | 3 refills | Status: DC

## 2016-08-03 NOTE — Progress Notes (Signed)
Cardiology Office Note   Date:  08/03/2016   ID:  [name], DOB 22-Sep-1950, MRN [id number]  PCP:  Sheral Flow, NP  Cardiologist:   Skeet Latch, MD  Nephrologist: Dr. Trinda Pascal [facility] office)  Chief Complaint  Patient presents with  . Follow-up     2 months; Pt states no Sx.     History of Present Illness: Laura Bush is a 65 y.o. female with breast cancer s/p lumpectomy and XRT, GERD, hypertension and hyperlipidemia who presents for follow up.  She was seen 04/2016 with a report of progressive exertional dyspnea. It is especially prominent when walking up an incline. She was referred for an exercise Myoview that was negative for ischemia and revealed LVEF 71%.  She also noted that her heart rate only gets into the 90s with exertion. At her last appointment metoprolol was switched to carvedilol. She followed up with our pharmacist in her blood pressure was well-controlled. However amlodipine was discontinued due to lower extremity edema.  She brings a log that shows three systolic blood pressures out of 19 with levels in the 140s.  These were all taken within 1 hour of taking her AM medication.  All others have been <140/80.  Since stopping amlodipine she has been feeling well.  She notes that her edema has improved and her legs no longer feel heavy.  She also feels like her heart rate and breathing are better when she exercises.     Past Medical History:  Diagnosis Date  . Asthma    in cold weather  . Breast cancer ([place]) 09/18/14   left breast  . Breast cancer ([place]) 10/07/14   bx left breast  . Breast cancer of upper-outer quadrant of left female breast ([place]) 09/22/2014  . Cluster headaches    in her 40's  . GERD (gastroesophageal reflux disease)   . Heart murmur    as a child (has outgrown)  . High cholesterol   . Hypertension   . Microscopic colitis   . OSA on CPAP   . Pneumonia 2000's X 1  . PONV (postoperative nausea and vomiting)   .  Rosacea   . S/P radiation therapy 12/30/14-01/27/15   left breast 50Gy total dose  . 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 Venous insufficiency     Past Surgical History:  Procedure Laterality Date  . ABDOMINAL HYSTERECTOMY  1999  . APPENDECTOMY  ~ 2006  . BREAST BIOPSY Left 10/2014; 10/2014  . BREAST REDUCTION SURGERY Bilateral 11/11/2014   Procedure: Bilateral Breast Reduction;  Surgeon: Crissie Reese, MD;  Location: [place];  Service: Plastics;  Laterality: Bilateral;  . CARPAL TUNNEL RELEASE Bilateral    2 surgeries on right, 1 on left  . CESAREAN SECTION  1978; 1980  . COLONOSCOPY    . FOREARM FRACTURE SURGERY Right 2003 X 3  . FRACTURE SURGERY    . KNEE ARTHROSCOPY Bilateral    meniscus repair  . MASTECTOMY COMPLETE / SIMPLE W/ SENTINEL NODE BIOPSY Left 11/11/2014  . PARTIAL MASTECTOMY WITH AXILLARY SENTINEL LYMPH NODE BIOPSY Left 11/11/2014  . PARTIAL MASTECTOMY WITH NEEDLE LOCALIZATION AND AXILLARY SENTINEL LYMPH NODE BX Left 11/11/2014   Procedure: LEFT BREAST PARTIAL MASTECTOMY WITH NEEDLE LOCALIZATION TIMES TWO AND LEFT AXILLARY SENTINEL LYMPH NODE Biopsy;  Surgeon: Fanny Skates, MD;  Location: [place];  Service: General;  Laterality: Left;  . REDUCTION MAMMAPLASTY Bilateral 11/11/2014  . SQUAMOUS CELL CARCINOMA EXCISION  1980's X 1   "  nose"  . TONSILLECTOMY  ~ 1957  . TUBAL LIGATION  ?1984     Current Outpatient Prescriptions  Medication Sig Dispense Refill  . acetaminophen (TYLENOL) 500 MG tablet Take 500 mg by mouth every 6 (six) hours as needed.    Marland Kitchen aMILoride (MIDAMOR) 5 MG tablet Take 5 mg by mouth daily.    . B Complex-C (B-COMPLEX WITH VITAMIN C) tablet Take 1 tablet by mouth daily.    . budesonide (ENTOCORT EC) 3 MG 24 hr capsule Take 3 tablets daily. (Patient taking differently: Take 6 mg by mouth daily. Take 2 tablets daily.) 90 capsule 3  . carvedilol (COREG) 12.5 MG tablet Take 1 tablet (12.5 mg total) by mouth 2 (two) times daily. 180 tablet 3  . Cholecalciferol  (VITAMIN D3) 2000 UNITS TABS Take 1 tablet by mouth daily.    Marland Kitchen gabapentin (NEURONTIN) 300 MG capsule Take 1 capsule (300 mg total) by mouth at bedtime. 90 capsule 4  . hydrochlorothiazide (MICROZIDE) 12.5 MG capsule Take 1 capsule (12.5 mg total) by mouth daily. 30 capsule 5  . Magnesium 200 MG TABS Take 1 tablet by mouth daily.    . pantoprazole (PROTONIX) 20 MG tablet Take 1 tablet (20 mg total) by mouth daily. (Patient taking differently: Take 20 mg by mouth daily as needed. ) 90 tablet 2  . simvastatin (ZOCOR) 20 MG tablet Take 1 tablet (20 mg total) by mouth daily. 90 tablet 1  . tamoxifen (NOLVADEX) 20 MG tablet Take 1 tablet (20 mg total) by mouth daily. 90 tablet 3  . telmisartan (MICARDIS) 80 MG tablet Take 1 tablet (80 mg total) by mouth daily. 90 tablet 3  . UNABLE TO FIND Please fit patient for a compression sleeve.  Diagnosis:  Bilateral breast cancer 1 each 0   No current facility-administered medications for this visit.     Allergies:   Amlodipine; Lanolin; and Tape    Social History:  The patient  reports that she has never smoked. She has never used smokeless tobacco. She reports that she drinks about 2.4 oz of alcohol per week . She reports that she does not use drugs.   Family History:  The patient's family history includes Dementia in her mother; Heart attack in her maternal grandmother; Heart failure in her father; Hyperlipidemia in her mother; Hypertension in her maternal grandfather, mother, and son; Stroke in her father and paternal grandfather; Uterine cancer (age of onset: 12) in her sister.    ROS:  Please see the history of present illness.   Otherwise, review of systems are positive for none.   All other systems are reviewed and negative.    PHYSICAL EXAM: VS:  BP 112/73   Pulse (!) 59   Ht '4\' 11"'$  (1.499 m)   Wt 84.7 kg (186 lb 12.8 oz)   BMI 37.73 kg/m  , BMI Body mass index is 37.73 kg/m. GENERAL:  Well appearing HEENT:  Pupils equal round and  reactive, fundi not visualized, oral mucosa unremarkable NECK:  No jugular venous distention, waveform within normal limits, carotid upstroke brisk and symmetric, no bruits LYMPHATICS:  No cervical adenopathy LUNGS:  Clear to auscultation bilaterally HEART:  RRR.  PMI not displaced or sustained,S1 and S2 within normal limits, no S3, no S4, no clicks, no rubs, I/VI systolic murmur at the RUSB ABD:  Flat, positive bowel sounds normal in frequency in pitch, no bruits, no rebound, no guarding, no midline pulsatile mass, no hepatomegaly, no splenomegaly EXT:  2  plus pulses throughout, no edema, no cyanosis no clubbing SKIN:  No rashes no nodules NEURO:  Cranial nerves II through XII grossly intact, motor grossly intact throughout PSYCH:  Cognitively intact, oriented to person place and time  EKG:  EKG is not ordered today. The ekg ordered 05/03/16 demonstrates sinus rhythm.  Rate 67 bpm.  L axis deviation.  Absent R wave progression  Exercise Myoview 05/11/16:   The left ventricular ejection fraction is hyperdynamic (>65%).  Nuclear stress EF: 71%.  Blood pressure demonstrated a normal response to exercise.  There was no ST segment deviation noted during stress.  The study is normal.   Normal stress nuclear study with no ischemia or infarction; EF 71 with normal wall motion.    Recent Labs: 05/30/2016: ALT 24; BUN 13.2; Creatinine 0.8; HGB 12.6; Platelets 161; Potassium 4.2; Sodium 141    Lipid Panel    Component Value Date/Time   CHOL 184 01/29/2016 1534   TRIG 155.0 (H) 01/29/2016 1534   HDL 55.00 01/29/2016 1534   CHOLHDL 3 01/29/2016 1534   VLDL 31.0 01/29/2016 1534   LDLCALC 98 01/29/2016 1534      Wt Readings from Last 3 Encounters:  08/03/16 84.7 kg (186 lb 12.8 oz)  06/03/16 84.9 kg (187 lb 3.2 oz)  05/30/16 85.8 kg (189 lb 3.2 oz)      ASSESSMENT AND PLAN:  # Exertional dyspnea:  Symptoms have improved with training and by switching metoprolol to  carvedilol.  # Hypertension: Blood pressure is well-controlled. She had lower extremity edema with amlodipine.  Continue carvedilol, amiloride, carvedilol, HCTZ and telmisartan.   # Hyperlipidemia: Continue simvastatin.  LDL 01/2016.   Current medicines are reviewed at length with the patient today.  The patient does not have concerns regarding medicines.  The following changes have been made:  no change  Labs/ tests ordered today include:   No orders of the defined types were placed in this encounter.    Disposition:   FU with Oluwasemilore Pascuzzi C. Oval Linsey, MD, [facility] in 1 year.   This note was written with the assistance of speech recognition software.  Please excuse any transcriptional errors.  Signed, Shayona Hibbitts C. Oval Linsey, MD, [facility]  08/03/2016 9:24 AM    [place]

## 2016-08-03 NOTE — Patient Instructions (Signed)
No changes with current medications    Medication refilled for 90 day supply      Your physician wants you to follow-up in 12 month with DR [place]. You will receive a reminder letter in the mail two months in advance. If you don't receive a letter, please call our office to schedule the follow-up appointment.   If you need a refill on your cardiac medications before your next appointment, please call your pharmacy.

## 2016-08-15 ENCOUNTER — Other Ambulatory Visit: Payer: Self-pay | Admitting: Oncology

## 2016-08-15 DIAGNOSIS — Z853 Personal history of malignant neoplasm of breast: Secondary | ICD-10-CM

## 2016-08-15 DIAGNOSIS — Z9889 Other specified postprocedural states: Secondary | ICD-10-CM

## 2016-08-29 ENCOUNTER — Encounter: Payer: Self-pay | Admitting: Cardiovascular Disease

## 2016-08-30 ENCOUNTER — Ambulatory Visit
Admission: RE | Admit: 2016-08-30 | Discharge: 2016-08-30 | Disposition: A | Discharge status: Home / Self Care | Payer: Medicare Other | Source: Ambulatory Visit | Attending: Oncology | Admitting: Oncology

## 2016-08-30 DIAGNOSIS — Z853 Personal history of malignant neoplasm of breast: Secondary | ICD-10-CM

## 2016-08-30 DIAGNOSIS — Z9889 Other specified postprocedural states: Secondary | ICD-10-CM

## 2016-08-30 DIAGNOSIS — R928 Other abnormal and inconclusive findings on diagnostic imaging of breast: Secondary | ICD-10-CM | POA: Diagnosis not present

## 2016-09-30 ENCOUNTER — Other Ambulatory Visit: Payer: Self-pay | Admitting: Primary Care

## 2016-12-15 IMAGING — MR MR LT BREAST BX W LOC DEV 1ST LESION IMAGE BX SPEC MR GUIDE
9 of 12 series · 33 of 48 positions shown · IV contrast (18ml Multihance)
Comparison: Previous exams.

ADDENDUM:
Pathology revealed atypical ductal hyperplasia in the left breast.
This was found to be concordant by Dr. Blain Jumper. Pathology
results were discussed with the patient and her husband by
telephone. She reported doing well after the biopsy with tenderness
and bruising at the site. Post biopsy instructions and care were
reviewed and her questions were answered. The patient has recently
been diagnosed with left breast invasive ductal carcinoma. She has a
follow-up appointment with Dr. Asli Wehner on October 09, 3014 to
discuss her treatment plan.

Pathology results reported by Maria Simone Natalino RN, BSN on October 09, 2014.
I have discussed this case with doctors Ayad and Vlore, who are
planning a combination of reduction surgery as well as a lumpectomy.
They would like to have both biopsy clips localized from the CC
projection with the nipple as near in profile is possible.
CLINICAL DATA: 5.5 cm abnormal enhancement extending anterior to
biopsy proven left breast 11 o'clock location DCIS/invasive ductal
carcinoma
EXAM:
MRI GUIDED CORE NEEDLE BIOPSY OF THE left BREAST
TECHNIQUE: Multiplanar, multisequence MR imaging of the left breast was
performed both before and after administration of intravenous
contrast.
CONTRAST:  18 cc MultiHance IV contrast

[Series 2: axial pre-cm · axial · non-contrast · 1.3mm · 0.73mm/px · z∈[-68,+118]mm · 4 of 144 slices shown]
[im 1/144]
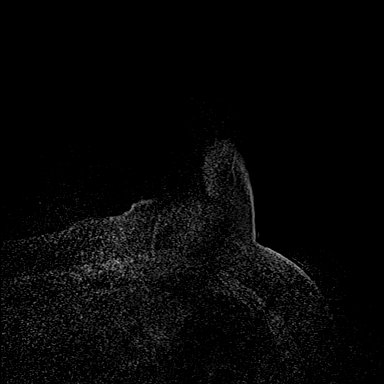
[im 48/144]
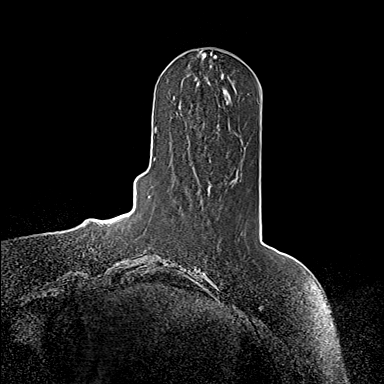
[im 96/144]
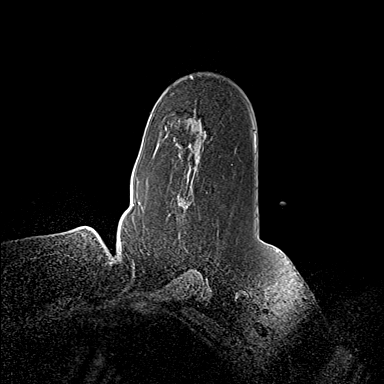
[im 144/144]
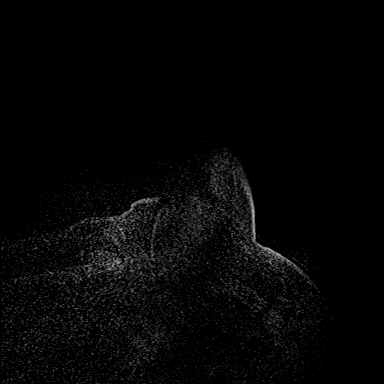

[Series 3: axial pre-cm_s2_(id) · axial · non-contrast · 1.3mm · 0.73mm/px · z∈[-68,+118]mm · 4 of 144 slices shown]
[im 1/144]
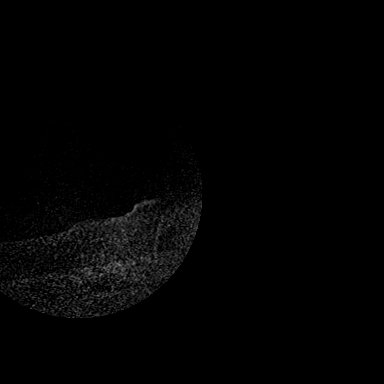
[im 48/144]
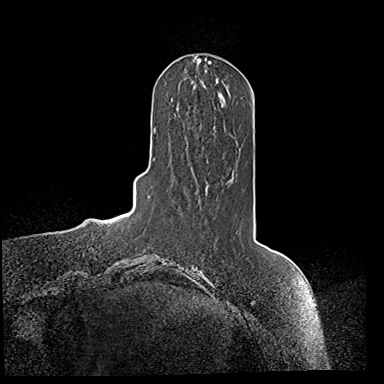
[im 96/144]
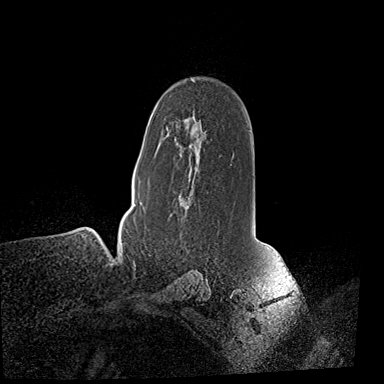
[im 144/144]
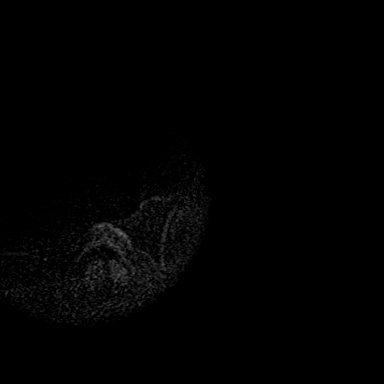

[Series 4: axial post 20 · axial · 1.3mm · 0.73mm/px · z∈[-68,+118]mm · 4 of 144 slices shown (1 of 3)]
[im 1/144]
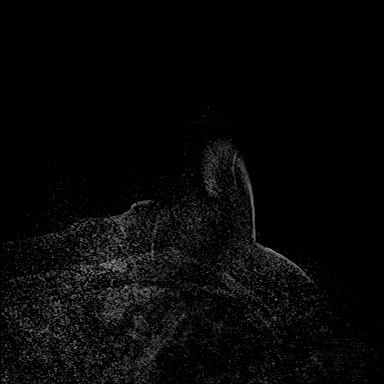
[im 48/144]
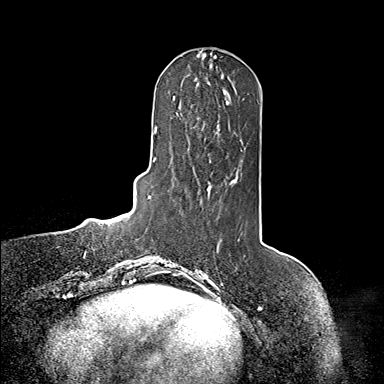
[im 96/144]
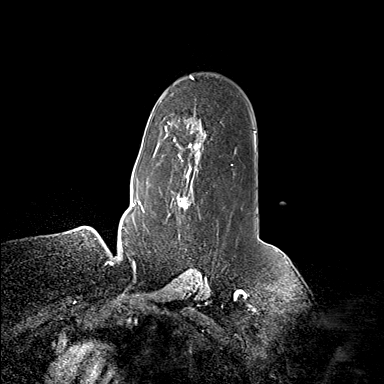
[im 144/144]
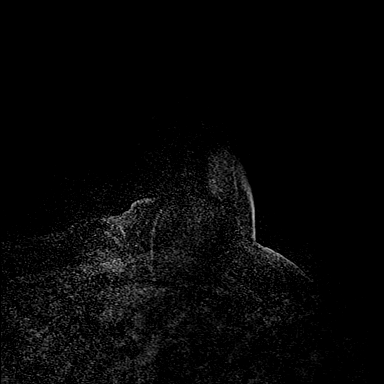

[Series 5: axial post 20 · axial · 1.3mm · 0.73mm/px · z∈[-68,+118]mm · 4 of 144 slices shown (2 of 3)]
[im 1/144]
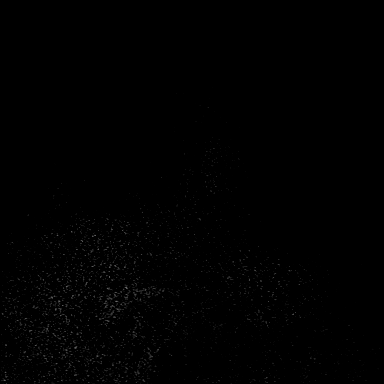
[im 48/144]
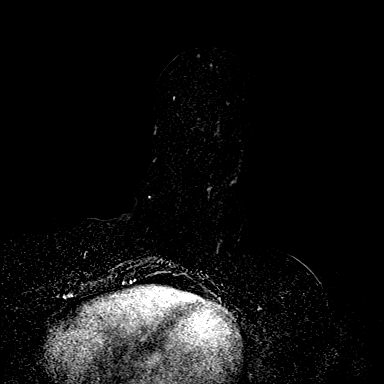
[im 96/144]
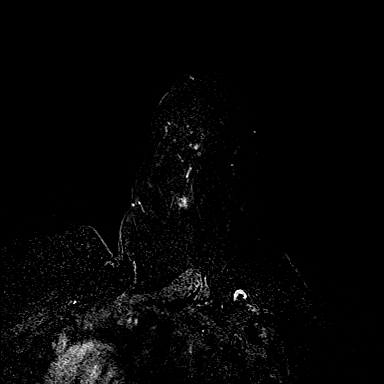
[im 144/144]
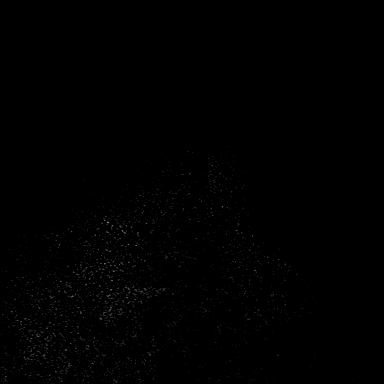

[Series 6: axial post 20 · axial · 1.3mm · 0.73mm/px · z∈[-68,+118]mm · 4 of 144 slices shown (3 of 3)]
[im 1/144]
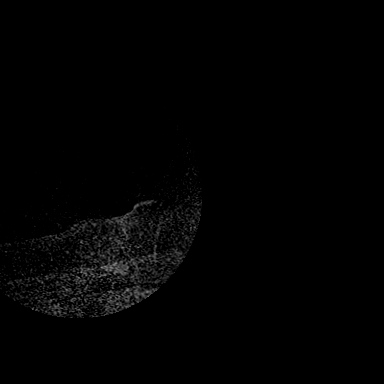
[im 48/144]
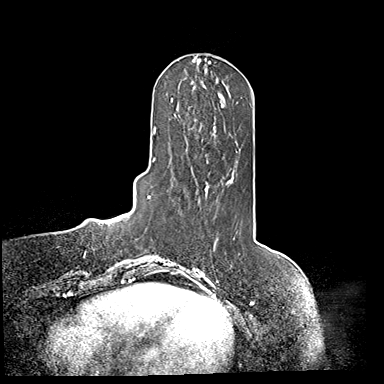
[im 96/144]
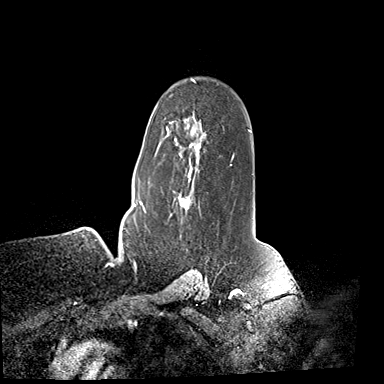
[im 144/144]
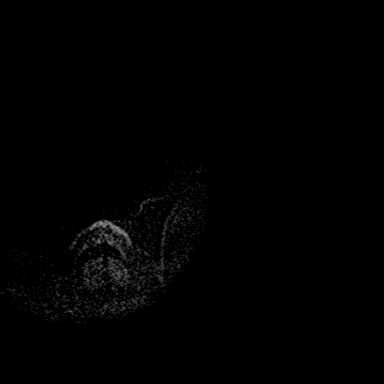

[Series 7: axial post 3 · axial · 1.3mm · 0.73mm/px · z∈[-68,+118]mm · 4 of 144 slices shown (1 of 3)]
[im 1/144]
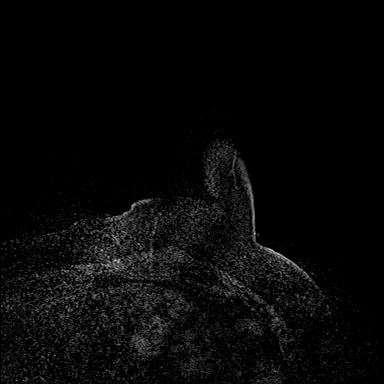
[im 48/144]
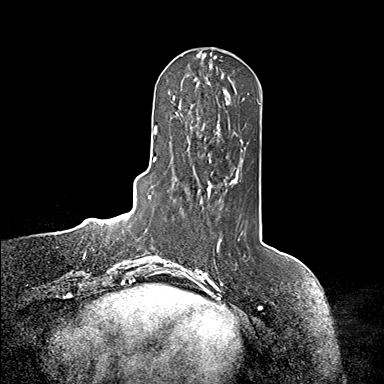
[im 96/144]
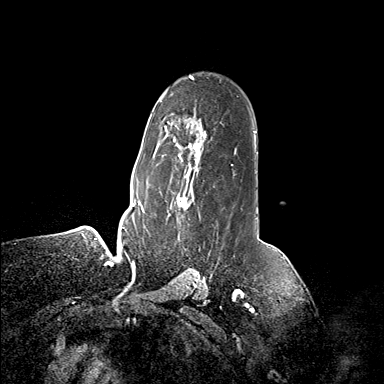
[im 144/144]
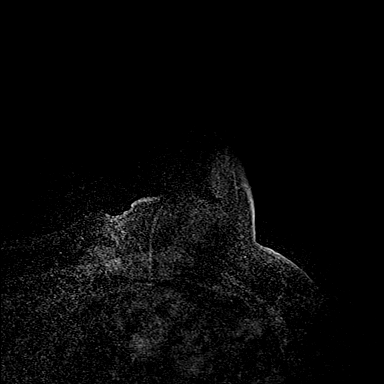

[Series 8: axial post 3 · axial · 1.3mm · 0.73mm/px · z∈[-68,+118]mm · 4 of 144 slices shown (2 of 3)]
[im 1/144]
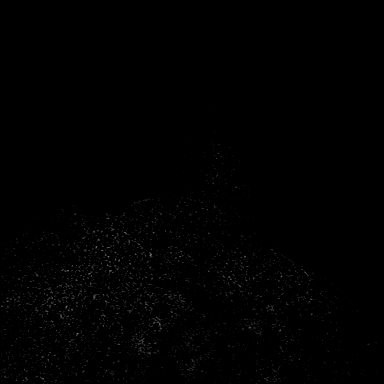
[im 48/144]
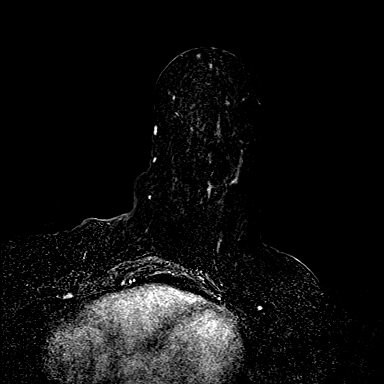
[im 96/144]
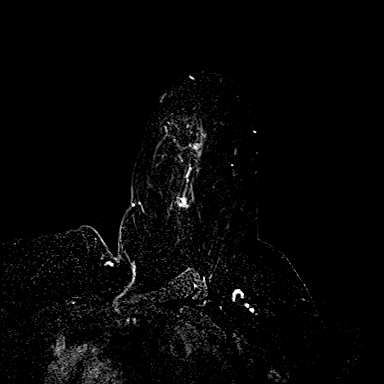
[im 144/144]
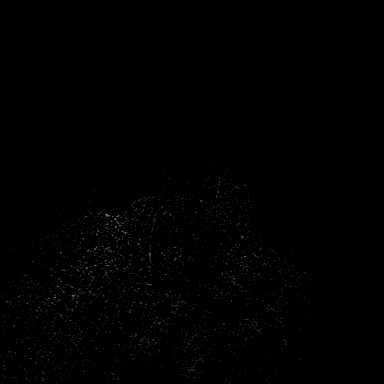

[Series 9: axial post 3 · axial · 1.3mm · 0.73mm/px · z∈[-68,+118]mm · 4 of 144 slices shown (3 of 3)]
[im 1/144]
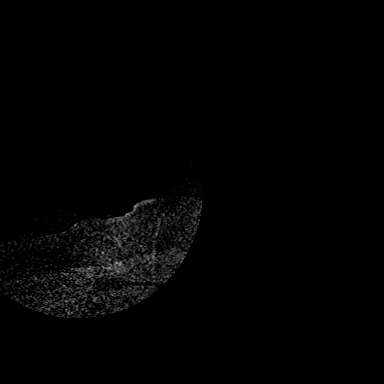
[im 48/144]
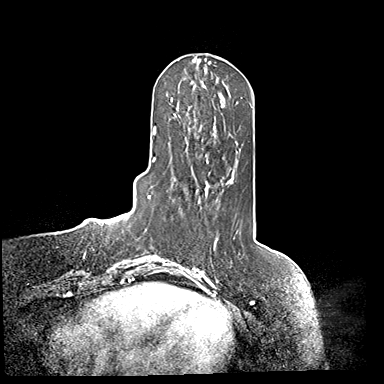
[im 96/144]
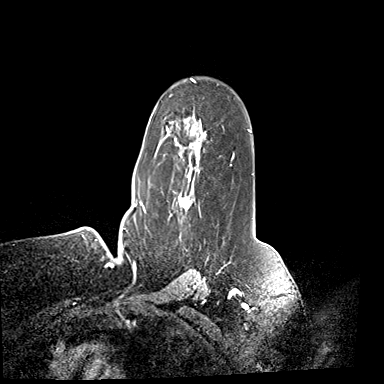
[im 144/144]
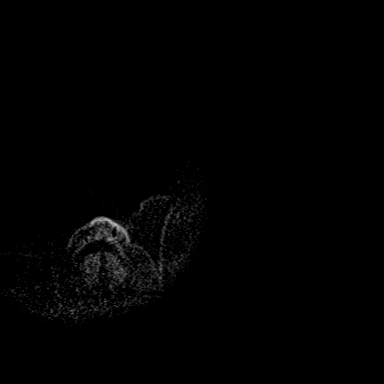

[Series 10: axial confirmation · axial · 1.3mm · 0.73mm/px · 1 of 144 slices shown]
[im 1/144]
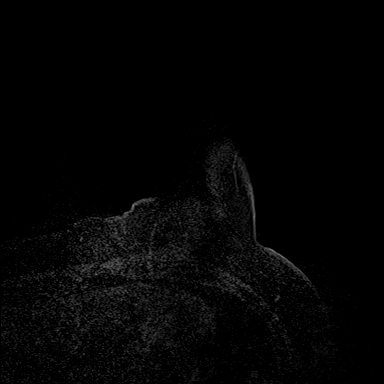

[33 of 48 positions shown; findings below may reference images not displayed]

FINDINGS: I met with the patient, and we discussed the procedure of MRI guided
biopsy, including risks, benefits, and alternatives. Specifically,
we discussed the risks of infection, bleeding, tissue injury, clip
migration, and inadequate sampling. Informed, written consent was
given. The usual time out protocol was performed immediately prior
to the procedure.

Using sterile technique, 2% Lidocaine, MRI guidance, and a 9 gauge
vacuum assisted device, biopsy was performed of left breast 11
o'clock location anterior enhancement using a lateral to medial
approach. At the conclusion of the procedure, a dumbbell shaped
tissue marker clip was deployed into the biopsy cavity. Follow-up
2-view mammogram was performed and dictated separately.
IMPRESSION: MRI guided biopsy of left breast 11 o'clock location abnormal
enhancement with dumbbell shaped clip placement. No apparent
complications.

## 2016-12-19 ENCOUNTER — Encounter: Payer: Self-pay | Admitting: Oncology

## 2017-01-26 ENCOUNTER — Encounter: Payer: Self-pay | Admitting: Primary Care

## 2017-02-03 ENCOUNTER — Ambulatory Visit
Admission: RE | Admit: 2017-02-03 | Discharge: 2017-02-03 | Disposition: A | Discharge status: Home / Self Care | Payer: Medicare Other | Source: Ambulatory Visit | Attending: Oncology | Admitting: Oncology

## 2017-02-03 DIAGNOSIS — N6489 Other specified disorders of breast: Secondary | ICD-10-CM | POA: Diagnosis not present

## 2017-02-03 DIAGNOSIS — C50911 Malignant neoplasm of unspecified site of right female breast: Secondary | ICD-10-CM

## 2017-02-03 DIAGNOSIS — C50912 Malignant neoplasm of unspecified site of left female breast: Principal | ICD-10-CM

## 2017-02-03 MED ORDER — GADOBENATE DIMEGLUMINE 529 MG/ML IV SOLN
17.0000 mL | Freq: Once | INTRAVENOUS | Status: AC | PRN
  Administered 2017-02-03: 17 mL via INTRAVENOUS

## 2017-03-21 NOTE — Progress Notes (Addendum)
[facility] [address], [place], [place] [address] [phone]  Date:  03/22/2017   Name:  Laura Bush   DOB:  Mar 22, 1951   MRN:  [id number]  PCP:  Laura Latch, MD    Chief Complaint: Establish Care (pt. here today to establish care with PCP; pt. voices no compliants; would like to have a few medications refilled)   History of Present Illness:  Laura Bush is a 66 y.o. very pleasant female patient who presents with the following:  transfering to our office from [facility] due to convenience.  Last had a CPE per Laura Bush last year History of breast cancer, GERD, HTN, hyperlipidemia, vitamin D def  She is followed by Dr. Jana Bush and has annual breast MRI.  Per his most recent note:   "Laura Bush"  recently moved to the [place] area from [place]. While still there, she had routine screening mammography with tomography 02/14/2014, which showed only a tiny breast cysts. Six-month follow-up exam was performed 08/25/2014, and now there was evidence of a possible focal asymmetry in the left breast. On 08/25/2014 at the [place] of radiology she underwent a unilateral diagnostic mammography with tomography , and this confirmed a spiculated mass in the left breast superiorly. Sonography of this area found a poorly defined hypoechoic mass measuring 0.7 cm at the 11:00 position 8 cm from the nipple. There was also an adjacent cluster of cysts with a benign circumcised appearance.   Biopsy of the mass in question was recommended, but as the patient was moving to this area, this was performed here on 09/18/2014. The pathology from that procedure ([id number]) found an invasive ductal carcinoma, grade 2, estrogen receptor 100% positive, with strong staining intensity, progesterone receptor 43% positive, with strong staining intensity, with an MIB-1 of 30%, and no HER-2 amplification.    on 09/26/2014 the  patient underwent bilateral breast MRI at [facility] imaging, showing a breast density category B. The right breast was unremarkable and there were no lymph nodes of concern. In the upper inner quadrant of the left breast there was an enhancing mass measuring 1.2 cm. In addition there was a 5.5 cm area of linear enhancement extending anteriorly from this mass. 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   The patient's subsequent history is as detailed below  INTERVAL HISTORY: Laura Bush returns today for follow-up of her invasive and noninvasive breast cancers. She tolerates tamoxifen well, without hot flashes or significant vaginal wetness problems. She obtains it at a good price.  She also saw cardiology- Dr. Oval Linsey- in the recent past due to exertional dyspnea.  However a stress was normal and her sx are now improved per most recent cardiology note  She continues to have a bi-annual breast MRI per Dr. Jana Bush She moved to [place] a couple of years ago to be closer to her twin grand-children, they are 2 yo  She uses gabapentin for hot flashes associated with her tamoxifen  She did have a hysterectomy in 1999 due to fibroids- benign pathology. Never had an abnl pap  She would like to have routine labs today She had a light meal this am  Patient Active Problem List   Diagnosis Date Noted  . Vitamin D deficiency 01/29/2016  . Preventative health care 01/29/2016  . Hot flashes due to tamoxifen 10/05/2015  . Diarrhea 05/27/2015  . Eczema 12/29/2014  . Venous insufficiency 10/16/2014  .  Essential hypertension 10/01/2014  . Hypercholesteremia 10/01/2014  . GERD (gastroesophageal reflux disease) 10/01/2014  . Bilateral breast cancer ([place]) 09/22/2014    Past Medical History:  Diagnosis Date  . Asthma    in cold weather  . Breast cancer ([place]) 09/18/14   left breast  . Breast cancer ([place]) 10/07/14   bx left breast  . Breast cancer of upper-outer quadrant of left female breast  ([place]) 09/22/2014  . Cluster headaches    in her 40's  . GERD (gastroesophageal reflux disease)   . Heart murmur    as a child (has outgrown)  . High cholesterol   . Hypertension   . Microscopic colitis   . OSA on CPAP   . Pneumonia 2000's X 1  . PONV (postoperative nausea and vomiting)   . Rosacea   . S/P radiation therapy 12/30/14-01/27/15   left breast 50Gy total dose  . 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 Venous insufficiency     Past Surgical History:  Procedure Laterality Date  . ABDOMINAL HYSTERECTOMY  1999  . APPENDECTOMY  ~ 2006  . BREAST BIOPSY Left 10/2014; 10/2014  . BREAST REDUCTION SURGERY Bilateral 11/11/2014   Procedure: Bilateral Breast Reduction;  Surgeon: Crissie Reese, MD;  Location: [place];  Service: Plastics;  Laterality: Bilateral;  . CARPAL TUNNEL RELEASE Bilateral    2 surgeries on right, 1 on left  . CESAREAN SECTION  1978; 1980  . COLONOSCOPY    . FOREARM FRACTURE SURGERY Right 2003 X 3  . FRACTURE SURGERY    . KNEE ARTHROSCOPY Bilateral    meniscus repair  . MASTECTOMY COMPLETE / SIMPLE W/ SENTINEL NODE BIOPSY Left 11/11/2014  . PARTIAL MASTECTOMY WITH AXILLARY SENTINEL LYMPH NODE BIOPSY Left 11/11/2014  . PARTIAL MASTECTOMY WITH NEEDLE LOCALIZATION AND AXILLARY SENTINEL LYMPH NODE BX Left 11/11/2014   Procedure: LEFT BREAST PARTIAL MASTECTOMY WITH NEEDLE LOCALIZATION TIMES TWO AND LEFT AXILLARY SENTINEL LYMPH NODE Biopsy;  Surgeon: Fanny Skates, MD;  Location: [place];  Service: General;  Laterality: Left;  . REDUCTION MAMMAPLASTY Bilateral 11/11/2014  . SQUAMOUS CELL CARCINOMA EXCISION  1980's X 1   "nose"  . TONSILLECTOMY  ~ 1957  . TUBAL LIGATION  ?1984    Social History  Substance Use Topics  . Smoking status: Never Smoker  . Smokeless tobacco: Never Used  . Alcohol use 2.4 oz/week    4 Glasses of wine per week    Family History  Problem Relation Age of Onset  . Uterine cancer Sister 49  . Stroke Father        deceased  . Heart failure  Father   . Dementia Mother        Lives in [place]  . Hypertension Mother   . Hyperlipidemia Mother   . Heart attack Maternal Grandmother   . Hypertension Maternal Grandfather   . Stroke Paternal Grandfather   . Hypertension Son     Allergies  Allergen Reactions  . Amlodipine Swelling  . Lanolin   . Tape Other (See Comments)    Redness (Bandaids also)    Medication list has been reviewed and updated.  Current Outpatient Prescriptions on File Prior to Visit  Medication Sig Dispense Refill  . acetaminophen (TYLENOL) 500 MG tablet Take 500 mg by mouth every 6 (six) hours as needed.    . B Complex-C (B-COMPLEX WITH VITAMIN C) tablet Take 1 tablet by mouth daily.    . Cholecalciferol (VITAMIN D3) 2000 UNITS TABS Take 1 tablet  by mouth daily.    Marland Kitchen gabapentin (NEURONTIN) 300 MG capsule Take 1 capsule (300 mg total) by mouth at bedtime. 90 capsule 4  . tamoxifen (NOLVADEX) 20 MG tablet Take 1 tablet (20 mg total) by mouth daily. 90 tablet 3  . UNABLE TO FIND Please fit patient for a compression sleeve.  Diagnosis:  Bilateral breast cancer 1 each 0   No current facility-administered medications on file prior to visit.     Review of Systems: Her exercise tolerance is now fine- she walks regularly and plans to do the women's only 5k walk this fall As per HPI- otherwise negative. No fever or chills She does get hot flashes due to tamoxifen No nausea, vomiting or diarrhea She does check her BP at home    Physical Examination: Vitals:   03/22/17 1157  BP: 128/67  Pulse: 61  Temp: 98.3 F (36.8 C)  SpO2: 99%   Vitals:   03/22/17 1157  Weight: 182 lb 3.2 oz (82.6 kg)  Height: 4' 11"  (1.499 m)   Body mass index is 36.8 kg/m. Ideal Body Weight: Weight in (lb) to have BMI = 25: 123.5  GEN: WDWN, NAD, Non-toxic, A & O x 3, overweight, otherwise looks well HEENT: Atraumatic, Normocephalic. Neck supple. No masses, No LAD.  Bilateral TM wnl, oropharynx normal.  PEERL,EOMI.    Ears and Nose: No external deformity. CV: RRR, No M/G/R. No JVD. No thrill. No extra heart sounds. PULM: CTA B, no wheezes, crackles, rhonchi. No retractions. No resp. distress. No accessory muscle use. ABD: S, NT, ND. No rebound. No HSM. EXTR: No c/c/e NEURO Normal gait.  PSYCH: Normally interactive. Conversant. Not depressed or anxious appearing.  Calm demeanor.    Assessment and Plan: Vitamin D deficiency - Plan: Vitamin D (25 hydroxy)  Essential hypertension - Plan: carvedilol (COREG) 12.5 MG tablet, hydrochlorothiazide (MICROZIDE) 12.5 MG capsule, telmisartan (MICARDIS) 80 MG tablet, aMILoride (MIDAMOR) 5 MG tablet, Comprehensive metabolic panel  Mixed hyperlipidemia - Plan: simvastatin (ZOCOR) 20 MG tablet, Lipid panel  Screening for deficiency anemia - Plan: CBC  Immunization due - Plan: Pneumococcal conjugate vaccine 13-valent IM  Screening for colon cancer  Here today to establish care Labs pending as above Did refills for her  Give prevnar 13 cologuard ordered See patient instructions for more details.   Will plan further follow- up pending labs.  Signed Lamar Blinks, MD  Received her labs, mychart message to pt  Results for orders placed or performed in visit on 03/22/17  CBC  Result Value Ref Range   WBC 4.9 4.0 - 10.5 K/uL   RBC 4.10 3.87 - 5.11 Mil/uL   Platelets 179.0 150.0 - 400.0 K/uL   Hemoglobin 13.7 12.0 - 15.0 g/dL   HCT 41.0 36.0 - 46.0 %   MCV 100.0 78.0 - 100.0 fl   MCHC 33.5 30.0 - 36.0 g/dL   RDW 12.9 11.5 - 15.5 %  Comprehensive metabolic panel  Result Value Ref Range   Sodium 142 135 - 145 mEq/L   Potassium 4.5 3.5 - 5.1 mEq/L   Chloride 106 96 - 112 mEq/L   CO2 30 19 - 32 mEq/L   Glucose, Bld 98 70 - 99 mg/dL   BUN 17 6 - 23 mg/dL   Creatinine, Ser 0.78 0.40 - 1.20 mg/dL   Total Bilirubin 0.5 0.2 - 1.2 mg/dL   Alkaline Phosphatase 35 (L) 39 - 117 U/L   AST 21 0 - 37 U/L   ALT 23 0 -  35 U/L   Total Protein 6.7 6.0 - 8.3 g/dL    Albumin 4.5 3.5 - 5.2 g/dL   Calcium 9.8 8.4 - 10.5 mg/dL   GFR 78.53 >60.00 mL/min  Lipid panel  Result Value Ref Range   Cholesterol 173 0 - 200 mg/dL   Triglycerides 156.0 (H) 0.0 - 149.0 mg/dL   HDL 56.20 >39.00 mg/dL   VLDL 31.2 0.0 - 40.0 mg/dL   LDL Cholesterol 85 0 - 99 mg/dL   Total CHOL/HDL Ratio 3    NonHDL 116.52   Vitamin D (25 hydroxy)  Result Value Ref Range   VITD 43.05 30.00 - 100.00 ng/mL

## 2017-03-22 ENCOUNTER — Encounter: Payer: Self-pay | Admitting: Family Medicine

## 2017-03-22 ENCOUNTER — Ambulatory Visit (INDEPENDENT_AMBULATORY_CARE_PROVIDER_SITE_OTHER): Payer: Medicare Other | Admitting: Family Medicine

## 2017-03-22 VITALS — BP 128/67 | HR 61 | Temp 98.3°F | Ht 59.0 in | Wt 182.2 lb

## 2017-03-22 DIAGNOSIS — Z1211 Encounter for screening for malignant neoplasm of colon: Secondary | ICD-10-CM

## 2017-03-22 DIAGNOSIS — Z23 Encounter for immunization: Secondary | ICD-10-CM | POA: Diagnosis not present

## 2017-03-22 DIAGNOSIS — E782 Mixed hyperlipidemia: Secondary | ICD-10-CM

## 2017-03-22 DIAGNOSIS — Z130 Encounter for screening for diseases of the blood and blood-forming organs and certain disorders involving the immune mechanism: Secondary | ICD-10-CM

## 2017-03-22 DIAGNOSIS — I10 Essential (primary) hypertension: Secondary | ICD-10-CM | POA: Diagnosis not present

## 2017-03-22 DIAGNOSIS — E559 Vitamin D deficiency, unspecified: Secondary | ICD-10-CM | POA: Diagnosis not present

## 2017-03-22 LAB — COMPREHENSIVE METABOLIC PANEL
ALBUMIN: 4.5 g/dL (ref 3.5–5.2)
ALK PHOS: 35 U/L — AB (ref 39–117)
ALT: 23 U/L (ref 0–35)
AST: 21 U/L (ref 0–37)
BILIRUBIN TOTAL: 0.5 mg/dL (ref 0.2–1.2)
BUN: 17 mg/dL (ref 6–23)
CO2: 30 mEq/L (ref 19–32)
CREATININE: 0.78 mg/dL (ref 0.40–1.20)
Calcium: 9.8 mg/dL (ref 8.4–10.5)
Chloride: 106 mEq/L (ref 96–112)
GFR: 78.53 mL/min (ref >60.00)
GLUCOSE: 98 mg/dL (ref 70–99)
POTASSIUM: 4.5 meq/L (ref 3.5–5.1)
SODIUM: 142 meq/L (ref 135–145)
TOTAL PROTEIN: 6.7 g/dL (ref 6.0–8.3)

## 2017-03-22 LAB — CBC
HEMATOCRIT: 41 % (ref 36.0–46.0)
Hemoglobin: 13.7 g/dL (ref 12.0–15.0)
MCHC: 33.5 g/dL (ref 30.0–36.0)
MCV: 100 fl (ref 78.0–100.0)
Platelets: 179 10*3/uL (ref 150.0–400.0)
RBC: 4.1 Mil/uL (ref 3.87–5.11)
RDW: 12.9 % (ref 11.5–15.5)
WBC: 4.9 10*3/uL (ref 4.0–10.5)

## 2017-03-22 LAB — LIPID PANEL
CHOLESTEROL: 173 mg/dL (ref 0–200)
HDL: 56.2 mg/dL (ref >39.00)
LDL Cholesterol: 85 mg/dL (ref 0–99)
NONHDL: 116.52
Total CHOL/HDL Ratio: 3
Triglycerides: 156 mg/dL — ABNORMAL HIGH (ref 0.0–149.0)
VLDL: 31.2 mg/dL (ref 0.0–40.0)

## 2017-03-22 LAB — VITAMIN D 25 HYDROXY (VIT D DEFICIENCY, FRACTURES): VITD: 43.05 ng/mL (ref 30.00–100.00)

## 2017-03-22 MED ORDER — HYDROCHLOROTHIAZIDE 12.5 MG PO CAPS: 12.5000 mg | ORAL_CAPSULE | Freq: Every day | ORAL | 3 refills | Status: DC

## 2017-03-22 MED ORDER — SIMVASTATIN 20 MG PO TABS: 20.0000 mg | ORAL_TABLET | Freq: Every day | ORAL | 3 refills | Status: DC

## 2017-03-22 MED ORDER — AMILORIDE HCL 5 MG PO TABS: 5.0000 mg | ORAL_TABLET | Freq: Every day | ORAL | 3 refills | Status: DC

## 2017-03-22 MED ORDER — CARVEDILOL 12.5 MG PO TABS: 12.5000 mg | ORAL_TABLET | Freq: Two times a day (BID) | ORAL | 3 refills | Status: DC

## 2017-03-22 MED ORDER — TELMISARTAN 80 MG PO TABS: 80.0000 mg | ORAL_TABLET | Freq: Every day | ORAL | 3 refills | Status: DC

## 2017-03-22 NOTE — Progress Notes (Signed)
Pre visit review using our clinic review tool, if applicable. No additional management support is needed unless otherwise documented below in the visit note. 

## 2017-03-22 NOTE — Patient Instructions (Signed)
It was a pleasure to see you today!  I will be in touch with your labs asap Continue your healthy exercise habits  You got your "prevnar 13" pneumonia vaccine today. In a year we can give you a dose of pneumovax 23 and you will be done with pneumonia vaccines  I will order Cologuard for you today- please do double check with [facility] that they cover this test, and also ask them about the Shingrix immunization7 series for shingles

## 2017-04-05 ENCOUNTER — Encounter: Payer: Self-pay | Admitting: Family Medicine

## 2017-04-05 DIAGNOSIS — Z1212 Encounter for screening for malignant neoplasm of rectum: Secondary | ICD-10-CM | POA: Diagnosis not present

## 2017-04-05 DIAGNOSIS — Z1211 Encounter for screening for malignant neoplasm of colon: Secondary | ICD-10-CM | POA: Diagnosis not present

## 2017-04-06 DIAGNOSIS — Z23 Encounter for immunization: Secondary | ICD-10-CM | POA: Diagnosis not present

## 2017-04-11 ENCOUNTER — Encounter: Payer: Self-pay | Admitting: Family Medicine

## 2017-04-13 LAB — COLOGUARD

## 2017-04-17 ENCOUNTER — Encounter: Payer: Self-pay | Admitting: Family Medicine

## 2017-04-17 ENCOUNTER — Telehealth: Payer: Self-pay | Admitting: *Deleted

## 2017-04-17 NOTE — Telephone Encounter (Signed)
Received negative results from Cologuard: [id number]; forwarded to provider/SLS 09/10

## 2017-05-10 ENCOUNTER — Other Ambulatory Visit: Payer: Self-pay | Admitting: Oncology

## 2017-05-10 ENCOUNTER — Encounter: Payer: Self-pay | Admitting: Oncology

## 2017-05-10 DIAGNOSIS — C50912 Malignant neoplasm of unspecified site of left female breast: Principal | ICD-10-CM

## 2017-05-10 DIAGNOSIS — C50911 Malignant neoplasm of unspecified site of right female breast: Secondary | ICD-10-CM

## 2017-05-15 ENCOUNTER — Other Ambulatory Visit: Payer: Self-pay | Admitting: *Deleted

## 2017-05-15 MED ORDER — GABAPENTIN 300 MG PO CAPS: 300.0000 mg | ORAL_CAPSULE | Freq: Every day | ORAL | 4 refills | Status: DC

## 2017-05-26 ENCOUNTER — Other Ambulatory Visit: Payer: Self-pay

## 2017-05-26 DIAGNOSIS — C50911 Malignant neoplasm of unspecified site of right female breast: Secondary | ICD-10-CM

## 2017-05-26 DIAGNOSIS — C50912 Malignant neoplasm of unspecified site of left female breast: Principal | ICD-10-CM

## 2017-05-28 NOTE — Progress Notes (Signed)
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 While still there, she had routine screening mammography with tomography 02/14/2014, which showed only a tiny breast cysts. Six-month follow-up exam was performed 08/25/2014, and now there was evidence of a possible focal asymmetry in the left breast. [facility]/2014 [facility] she underwent a unilateral diagnostic mammography with tomography , and this confirmed a spiculated mass in the left breast superiorly. Sonography of this area found a poorly defined hypoechoic mass measuring 0.7 cm at the 11:00 position 8 cm from the nipple. There was also an adjacent cluster of cysts with a benign circumcised appearance.   Biopsy of the mass in question was recommended, but as the patient was moving to this area, this was performed here on 09/18/2014. 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    on 09/26/2014 the patient underwent bilateral breast MRI at [facility], showing a breast density category B. The right breast was unremarkable and there were no lymph nodes of concern. In the upper inner quadrant of the left breast there was an  enhancing mass measuring 1.2 cm. In addition there was a 5.5 cm area of linear enhancement extending anteriorly from this mass. 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   The patient's subsequent history is as detailed below  INTERVAL HISTORY: Laura Bush returns today for follow-up and treatment of her estrogen receptor positive right-sided breast cancer. She continues on tamoxifen, with good tolerance. She doesn't have an increase in vaginal wetness. She no longer has hot flashes and takes gabapentin at night to alleviate this. She does need a refill of her tamoxifen.   Since Kazue's last visit to the office, she has underwent diagnostic bilateral mammography with tomography at [facility] on 08/30/2016 that resulted with no evidence of malignancy. Also, since she had ductal carcinoma in situ incidentally noted after reconstruction, with no adjuvant radiation given, she receives yearly breast MRI. On 02/03/2017 she had MR Breast Bilateral at [facility] with results that showed benign findings.   REVIEW OF SYSTEMS: Laura Bush reports that for exercise, she does yoga twice a week. She also watches her grandchildren and takes trips to the park with them for exercise. She is retired at this time and enjoys sewing, traveling with her husband, and visiting others. She denies unusual headaches, visual changes, nausea, vomiting, or dizziness. There has been no unusual cough, phlegm production, or pleurisy. This been no change in bowel or bladder habits. She denies unexplained fatigue or unexplained weight loss, bleeding, rash, or fever. A detailed review of systems was otherwise stable.     PAST MEDICAL HISTORY: Past Medical History:  Diagnosis Date  . Asthma    in cold weather  . Breast cancer ([place]) 09/18/14  left breast  . Breast cancer ([place]) 10/07/14   bx left breast  . Breast cancer of upper-outer quadrant of left female breast ([place]) 09/22/2014  . Cluster headaches    in her  40's  . GERD (gastroesophageal reflux disease)   . Heart murmur    as a child (has outgrown)  . High cholesterol   . Hypertension   . Microscopic colitis   . OSA on CPAP   . Pneumonia 2000's X 1  . PONV (postoperative nausea and vomiting)   . Rosacea   . S/P radiation therapy 12/30/14-01/27/15   left breast 50Gy total dose  . 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 Venous insufficiency     PAST SURGICAL HISTORY: Past Surgical History:  Procedure Laterality Date  . ABDOMINAL HYSTERECTOMY  1999  . APPENDECTOMY  ~ 2006  . BREAST BIOPSY Left 10/2014; 10/2014  . BREAST REDUCTION SURGERY Bilateral 11/11/2014   Procedure: Bilateral Breast Reduction;  Surgeon: Crissie Reese, MD;  Location: [facility] [place];  Service: Plastics;  Laterality: Bilateral;  . CARPAL TUNNEL RELEASE Bilateral    2 surgeries on right, 1 on left  . CESAREAN SECTION  1978; 1980  . COLONOSCOPY    . FOREARM FRACTURE SURGERY Right 2003 X 3  . FRACTURE SURGERY    . KNEE ARTHROSCOPY Bilateral    meniscus repair  . MASTECTOMY COMPLETE / SIMPLE W/ SENTINEL NODE BIOPSY Left 11/11/2014  . PARTIAL MASTECTOMY WITH AXILLARY SENTINEL LYMPH NODE BIOPSY Left 11/11/2014  . PARTIAL MASTECTOMY WITH NEEDLE LOCALIZATION AND AXILLARY SENTINEL LYMPH NODE BX Left 11/11/2014   Procedure: LEFT BREAST PARTIAL MASTECTOMY WITH NEEDLE LOCALIZATION TIMES TWO AND LEFT AXILLARY SENTINEL LYMPH NODE Biopsy;  Surgeon: Fanny Skates, MD;  Location: [facility];  Service: General;  Laterality: Left;  . REDUCTION MAMMAPLASTY Bilateral 11/11/2014  . SQUAMOUS CELL CARCINOMA EXCISION  1980's X 1   "nose"  . TONSILLECTOMY  ~ 1957  . TUBAL LIGATION  ?1984    FAMILY HISTORY Family History  Problem Relation Age of Onset  . Uterine cancer Sister 44  . Stroke Father        deceased  . Heart failure Father   . Dementia Mother        Lives in [place]  . Hypertension Mother   . Hyperlipidemia Mother   . Heart attack Maternal Grandmother   . Hypertension Maternal Grandfather     . Stroke Paternal Grandfather   . Hypertension Son    the patient's father died at the age of 27 following a stroke. The patient's mother is still living, age 46. GEN had no brothers, one sister. That sister was diagnosed with uterine cancer at the age of 83. There is no other history of breast or ovarian cancer in the family to the patient's knowledge  GYNECOLOGIC HISTORY:  No LMP recorded. Patient has had a hysterectomy.  menarche age 40, first live birth age 44, the patient is [place] P2.  She underwent total abdominal hysterectomy with bilateral salpingo-oophorectomy in 1999. She took hormone replacement for approximately 4 years. 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 Her husband, Louie Casa still works for an Orthoptist. Their daughter Luane School lives in [place] where she works as an Web designer. Their son Robert Bush lives in [place] where he works as a  Education officer, museum. Robert's twins are now 58.66 years old and he has one boy and one girl.     ADVANCED DIRECTIVES:  Not in place. During the 12/03/2014 visited the patient was given the appropriate forms to complete and notarize at her discretion.   HEALTH MAINTENANCE: Social History  Substance Use Topics  . Smoking status: Never Smoker  . Smokeless tobacco: Never Used  . Alcohol use 2.4 oz/week    4 Glasses of wine per week     Colonoscopy:  2008  PAP:  Bone density: 2007/"normal"  Lipid panel:  Allergies  Allergen Reactions  . Amlodipine Swelling  . Lanolin   . Tape Other (See Comments)    Redness (Bandaids also)    Current Outpatient Prescriptions  Medication Sig Dispense Refill  . acetaminophen (TYLENOL) 500 MG tablet Take 500 mg by mouth every 6 (six) hours as needed.    Marland Kitchen aMILoride (MIDAMOR) 5 MG tablet Take 1 tablet (5  mg total) by mouth daily. 90 tablet 3  . B Complex-C (B-COMPLEX WITH VITAMIN C) tablet Take 1 tablet by mouth daily.    . carvedilol (COREG) 12.5 MG tablet Take 1 tablet (12.5 mg total) by mouth 2 (two) times daily. 180 tablet 3  . Cholecalciferol (VITAMIN D3) 2000 UNITS TABS Take 1 tablet by mouth daily.    Marland Kitchen gabapentin (NEURONTIN) 300 MG capsule Take 1 capsule (300 mg total) by mouth at bedtime. 90 capsule 4  . hydrochlorothiazide (MICROZIDE) 12.5 MG capsule Take 1 capsule (12.5 mg total) by mouth daily. 90 capsule 3  . simvastatin (ZOCOR) 20 MG tablet Take 1 tablet (20 mg total) by mouth daily. 90 tablet 3  . tamoxifen (NOLVADEX) 20 MG tablet Take 1 tablet (20 mg total) by mouth daily. 90 tablet 3  . telmisartan (MICARDIS) 80 MG tablet Take 1 tablet (80 mg total) by mouth daily. 90 tablet 3  . UNABLE TO FIND Please fit patient for a compression sleeve.  Diagnosis:  Bilateral breast cancer 1 each 0   No current facility-administered medications for this visit.     OBJECTIVE:  Middle-aged white woman   Vitals:   05/29/17 1027  BP: 135/61  Pulse: 65  Resp: 18  Temp: 98.2 F (36.8 C)  SpO2: 98%     Body mass index is 37.77 kg/m.    ECOG FS:0 - Asymptomatic  Sclerae unicteric, pupils round and equal Oropharynx clear and moist No cervical or supraclavicular adenopathy Lungs no rales or rhonchi Heart regular rate and rhythm Abd soft, nontender, positive bowel sounds MSK no focal spinal tenderness, no upper extremity lymphedema Neuro: nonfocal, well oriented, appropriate affect Breasts: The right breast is status post reduction mammoplasty. Exam is difficult but there is no evidence of DCIS recurrence. Left breast is status post lumpectomy and radiation with no evidence of local recurrence. Both axillae are benign.  LAB RESULTS:  CMP     Component Value Date/Time   NA 142 03/22/2017 1230   NA 141 05/30/2016 1111   K 4.5 03/22/2017 1230   K 4.2 05/30/2016 1111   CL 106  03/22/2017 1230   CO2 30 03/22/2017 1230   CO2 26 05/30/2016 1111   GLUCOSE 98 03/22/2017 1230   GLUCOSE 93 05/30/2016 1111   BUN 17 03/22/2017 1230   BUN 13.2 05/30/2016 1111   CREATININE 0.78 03/22/2017 1230   CREATININE 0.8 05/30/2016 1111   CALCIUM 9.8 03/22/2017 1230   CALCIUM 9.5 05/30/2016 1111   PROT 6.7 03/22/2017 1230  PROT 6.7 05/30/2016 1111   ALBUMIN 4.5 03/22/2017 1230   ALBUMIN 3.7 05/30/2016 1111   AST 21 03/22/2017 1230   AST 19 05/30/2016 1111   ALT 23 03/22/2017 1230   ALT 24 05/30/2016 1111   ALKPHOS 35 (L) 03/22/2017 1230   ALKPHOS 40 05/30/2016 1111   BILITOT 0.5 03/22/2017 1230   BILITOT 0.39 05/30/2016 1111   GFRNONAA 68 (L) 11/05/2014 1142   GFRAA 78 (L) 11/05/2014 1142    INo results found for: SPEP, UPEP  Lab Results  Component Value Date   WBC 4.1 05/29/2017   NEUTROABS 2.4 05/29/2017   HGB 13.0 05/29/2017   HCT 38.2 05/29/2017   MCV 98.1 05/29/2017   PLT 164 05/29/2017      Chemistry      Component Value Date/Time   NA 142 03/22/2017 1230   NA 141 05/30/2016 1111   K 4.5 03/22/2017 1230   K 4.2 05/30/2016 1111   CL 106 03/22/2017 1230   CO2 30 03/22/2017 1230   CO2 26 05/30/2016 1111   BUN 17 03/22/2017 1230   BUN 13.2 05/30/2016 1111   CREATININE 0.78 03/22/2017 1230   CREATININE 0.8 05/30/2016 1111      Component Value Date/Time   CALCIUM 9.8 03/22/2017 1230   CALCIUM 9.5 05/30/2016 1111   ALKPHOS 35 (L) 03/22/2017 1230   ALKPHOS 40 05/30/2016 1111   AST 21 03/22/2017 1230   AST 19 05/30/2016 1111   ALT 23 03/22/2017 1230   ALT 24 05/30/2016 1111   BILITOT 0.5 03/22/2017 1230   BILITOT 0.39 05/30/2016 1111       No results found for: LABCA2  No components found for: LABCA125  No results for input(s): INR in the last 168 hours.  Urinalysis    Component Value Date/Time   COLORURINE YELLOW 11/05/2014 1142   APPEARANCEUR CLEAR 11/05/2014 1142   LABSPEC 1.008 11/05/2014 1142   PHURINE 7.0 11/05/2014 1142    GLUCOSEU NEGATIVE 11/05/2014 1142   HGBUR NEGATIVE 11/05/2014 1142   BILIRUBINUR NEGATIVE 11/05/2014 1142   KETONESUR NEGATIVE 11/05/2014 1142   PROTEINUR NEGATIVE 11/05/2014 1142   UROBILINOGEN 0.2 11/05/2014 1142   NITRITE NEGATIVE 11/05/2014 1142   LEUKOCYTESUR NEGATIVE 11/05/2014 1142    STUDIES: Bilateral Diagnostic Mammography with [facility]2016 with results that showed: Breast density B. No evidence of malignancy.   MR Breast Bilateral at [facility] on 02/03/2017 with results that showed: Breast composition B. Benign findings.     No results found.  ASSESSMENT: 66 y.o. [place], [place] woman status post left breast upper outer quadrant biopsy 09/18/2014 for a clinical T1 cN0 invasive ductal carcinoma, grade 2, estrogen and progesterone receptor positive, HER-2 not amplified, with an MIB-1 of 30%  (1) there is a 5.5 cm area of non-masslike enhancement seen in the left breast with biopsy 10/07/2014 showing atypical ductal hyperplasia  (2) status post left lumpectomy and sentinel lymph node sampling 11/11/2014 for a  pT1c  pN0, stage IA invasive ductal carcinoma, grade 3, repeat HER-2 again negative, with negative margins  (3) an Oncotype DX score of 11 predicts an 8% risk of outside the breast recurrence within 10 years if the patient's only systemic therapy is tamoxifen for 5 years. It also predicts no benefit from chemotherapy  (4) Pathology from right reduction mammoplasty 11/11/2014 unexpectedly showed ductal carcinoma in situ, estrogen receptor 100% positive, progesterone receptor 71% positive  (5) adjuvant radiation completed 01/27/2015  (6) anastrozole started June  2016, stopped 05/28/15 due to intolerance  (a) bone density 02/17/2015 was normal with a T score of -0.9  (7) tamoxifen started 07/14/15  (8) because of the incomplete resection on the right breast, bilateral yearly breast MRI are recommended, in addition to mammography.  (a)  bilateral breast MRI 10/16/2015 was negative  (b) bilateral breast MRI 02/03/2017 showed no suspicious findings  PLAN: Laura Bush s now 2-1/2 years out from definitive surgery for her bilateral breast cancers, with no evidence of disease activity. This is very favorable.  She continues on tamoxifen, with excellent tolerance. The plan is to continue that for a total of 5 years.  On the right side, she found incidentally noted ductal carcinoma in situ at the time of reconstruction. Accordingly we do not have assured margins and she did not receive postoperative radiation. For that reason we are continuing breast MRI on a yearly basis. She will have her next breast MRI next June.  Of course she has routine mammography in January of the year  Accordingly I am going to see her again in late January 2020. She knows to call for any other issues that may develop before that visit.  Rajanee Schuelke, Virgie Dad, MD  05/29/17 10:36 AM Medical Oncology and Hematology [facility] [address] [place], [place] [address] Tel. [phone]    Fax. [phone]  This document serves as a record of services personally performed by Lurline Del, MD. It was created on her behalf by Steva Colder, a trained medical scribe. The creation of this record is based on the scribe's personal observations and the provider's statements to them. This document has been checked and approved by the attending provider.

## 2017-05-29 ENCOUNTER — Other Ambulatory Visit (HOSPITAL_BASED_OUTPATIENT_CLINIC_OR_DEPARTMENT_OTHER): Payer: Medicare Other

## 2017-05-29 ENCOUNTER — Ambulatory Visit (HOSPITAL_BASED_OUTPATIENT_CLINIC_OR_DEPARTMENT_OTHER): Payer: Medicare Other | Admitting: Oncology

## 2017-05-29 DIAGNOSIS — D0511 Intraductal carcinoma in situ of right breast: Secondary | ICD-10-CM

## 2017-05-29 DIAGNOSIS — Z170 Estrogen receptor positive status [ER+]: Secondary | ICD-10-CM

## 2017-05-29 DIAGNOSIS — Z79810 Long term (current) use of selective estrogen receptor modulators (SERMs): Secondary | ICD-10-CM | POA: Diagnosis not present

## 2017-05-29 DIAGNOSIS — C50412 Malignant neoplasm of upper-outer quadrant of left female breast: Secondary | ICD-10-CM

## 2017-05-29 DIAGNOSIS — C50911 Malignant neoplasm of unspecified site of right female breast: Secondary | ICD-10-CM

## 2017-05-29 DIAGNOSIS — C50812 Malignant neoplasm of overlapping sites of left female breast: Secondary | ICD-10-CM | POA: Insufficient documentation

## 2017-05-29 DIAGNOSIS — C50912 Malignant neoplasm of unspecified site of left female breast: Principal | ICD-10-CM

## 2017-05-29 LAB — COMPREHENSIVE METABOLIC PANEL
ALT: 19 U/L (ref 0–55)
ANION GAP: 8 meq/L (ref 3–11)
AST: 18 U/L (ref 5–34)
Albumin: 3.7 g/dL (ref 3.5–5.0)
Alkaline Phosphatase: 39 U/L — ABNORMAL LOW (ref 40–150)
BILIRUBIN TOTAL: 0.48 mg/dL (ref 0.20–1.20)
BUN: 13.5 mg/dL (ref 7.0–26.0)
CALCIUM: 9.4 mg/dL (ref 8.4–10.4)
CHLORIDE: 107 meq/L (ref 98–109)
CO2: 28 mEq/L (ref 22–29)
CREATININE: 0.8 mg/dL (ref 0.6–1.1)
EGFR: >60 mL/min/{1.73_m2} (ref >60)
Glucose: 86 mg/dl (ref 70–140)
Potassium: 3.9 mEq/L (ref 3.5–5.1)
Sodium: 142 mEq/L (ref 136–145)
TOTAL PROTEIN: 6.8 g/dL (ref 6.4–8.3)

## 2017-05-29 LAB — CBC WITH DIFFERENTIAL/PLATELET
BASO%: 0.5 % (ref 0.0–2.0)
Basophils Absolute: 0 10*3/uL (ref 0.0–0.1)
EOS ABS: 0.1 10*3/uL (ref 0.0–0.5)
EOS%: 3.4 % (ref 0.0–7.0)
HEMATOCRIT: 38.2 % (ref 34.8–46.6)
HGB: 13 g/dL (ref 11.6–15.9)
LYMPH#: 1.1 10*3/uL (ref 0.9–3.3)
LYMPH%: 26.7 % (ref 14.0–49.7)
MCH: 33.4 pg (ref 25.1–34.0)
MCHC: 34 g/dL (ref 31.5–36.0)
MCV: 98.1 fL (ref 79.5–101.0)
MONO#: 0.5 10*3/uL (ref 0.1–0.9)
MONO%: 11.1 % (ref 0.0–14.0)
NEUT#: 2.4 10*3/uL (ref 1.5–6.5)
NEUT%: 58.3 % (ref 38.4–76.8)
PLATELETS: 164 10*3/uL (ref 145–400)
RBC: 3.9 10*6/uL (ref 3.70–5.45)
RDW: 12.5 % (ref 11.2–14.5)
WBC: 4.1 10*3/uL (ref 3.9–10.3)

## 2017-05-29 MED ORDER — TAMOXIFEN CITRATE 20 MG PO TABS: 20.0000 mg | ORAL_TABLET | Freq: Every day | ORAL | 3 refills | Status: DC

## 2017-07-04 ENCOUNTER — Encounter: Payer: Self-pay | Admitting: Oncology

## 2017-07-12 ENCOUNTER — Ambulatory Visit (INDEPENDENT_AMBULATORY_CARE_PROVIDER_SITE_OTHER): Payer: Medicare Other | Admitting: Cardiovascular Disease

## 2017-07-12 ENCOUNTER — Encounter: Payer: Self-pay | Admitting: Cardiovascular Disease

## 2017-07-12 VITALS — BP 130/68 | HR 56 | Ht 59.0 in | Wt 182.8 lb

## 2017-07-12 DIAGNOSIS — E7800 Pure hypercholesterolemia, unspecified: Secondary | ICD-10-CM

## 2017-07-12 DIAGNOSIS — I10 Essential (primary) hypertension: Secondary | ICD-10-CM

## 2017-07-12 NOTE — Patient Instructions (Signed)

## 2017-07-12 NOTE — Progress Notes (Signed)
Cardiology Office Note   Date:  07/12/2017   ID:  Laura Bush, DOB October 22, 1950, MRN [id number]  PCP:  Laura Mclean, MD  Cardiologist:   Laura Latch, MD  Nephrologist: Dr. Trinda Bush [facility] office)  No chief complaint on file.   History of Present Illness: Laura Bush is a 66 y.o. female with breast cancer s/p lumpectomy and XRT, GERD, hypertension and hyperlipidemia who presents for follow up.  She was seen 04/2016 with a report of progressive exertional dyspnea. It was especially prominent when walking up an incline. She was referred for an exercise Myoview that was negative for ischemia and revealed LVEF 71%.  She also noted that her heart rate only gets into the 90s with exertion.  Metoprolol was switched to carvedilol. She followed up with our pharmacist in her blood pressure was well-controlled. However amlodipine was discontinued due to lower extremity edema.  Since her last appointment [name] has been doing well.  She notices swelling in her hands and feet after eating a salty meal.  It improves with drinking a lot of water time.  She denies orthopnea or PND.  She has not noted any chest pain or shortness of breath lately.  She does yoga twice per week.  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  She looks forward to going to the gym.  Her blood pressure at home has been in the 110s over 70s.   Past Medical History:  Diagnosis Date  . Asthma    in cold weather  . Breast cancer ([place]) 09/18/14   left breast  . Breast cancer ([place]) 10/07/14   bx left breast  . Breast cancer of upper-outer quadrant of left female breast ([place]) 09/22/2014  . Cluster headaches    in her 40's  . GERD (gastroesophageal reflux disease)   . Heart murmur    as a child (has outgrown)  . High cholesterol   . Hypertension   . Microscopic colitis   . OSA on CPAP   . Pneumonia 2000's X 1  . PONV (postoperative nausea and vomiting)    . Rosacea   . S/P radiation therapy 12/30/14-01/27/15   left breast 50Gy total dose  . 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 Venous insufficiency     Past Surgical History:  Procedure Laterality Date  . ABDOMINAL HYSTERECTOMY  1999  . APPENDECTOMY  ~ 2006  . BREAST BIOPSY Left 10/2014; 10/2014  . BREAST REDUCTION SURGERY Bilateral 11/11/2014   Procedure: Bilateral Breast Reduction;  Surgeon: Crissie Reese, MD;  Location: [facility];  Service: Plastics;  Laterality: Bilateral;  . CARPAL TUNNEL RELEASE Bilateral    2 surgeries on right, 1 on left  . CESAREAN SECTION  1978; 1980  . COLONOSCOPY    . FOREARM FRACTURE SURGERY Right 2003 X 3  . FRACTURE SURGERY    . KNEE ARTHROSCOPY Bilateral    meniscus repair  . MASTECTOMY COMPLETE / SIMPLE W/ SENTINEL NODE BIOPSY Left 11/11/2014  . PARTIAL MASTECTOMY WITH AXILLARY SENTINEL LYMPH NODE BIOPSY Left 11/11/2014  . PARTIAL MASTECTOMY WITH NEEDLE LOCALIZATION AND AXILLARY SENTINEL LYMPH NODE BX Left 11/11/2014   Procedure: LEFT BREAST PARTIAL MASTECTOMY WITH NEEDLE LOCALIZATION TIMES TWO AND LEFT AXILLARY SENTINEL LYMPH NODE Biopsy;  Surgeon: Fanny Skates, MD;  Location: [facility];  Service: General;  Laterality: Left;  . REDUCTION MAMMAPLASTY Bilateral 11/11/2014  . SQUAMOUS CELL CARCINOMA EXCISION  1980's X  1   "nose"  . TONSILLECTOMY  ~ 1957  . TUBAL LIGATION  ?1984     Current Outpatient Medications  Medication Sig Dispense Refill  . acetaminophen (TYLENOL) 500 MG tablet Take 500 mg by mouth every 6 (six) hours as needed.    Marland Kitchen aMILoride (MIDAMOR) 5 MG tablet Take 1 tablet (5 mg total) by mouth daily. 90 tablet 3  . B Complex-C (B-COMPLEX WITH VITAMIN C) tablet Take 1 tablet by mouth daily.    . carvedilol (COREG) 12.5 MG tablet Take 12.5 mg by mouth 2 (two) times daily with a meal.    . Cholecalciferol (VITAMIN D3) 2000 UNITS TABS Take 1 tablet by mouth daily.    Marland Kitchen gabapentin (NEURONTIN) 300 MG capsule Take 1 capsule (300 mg total) by mouth at  bedtime. 90 capsule 4  . hydrochlorothiazide (MICROZIDE) 12.5 MG capsule Take 1 capsule (12.5 mg total) by mouth daily. 90 capsule 3  . simvastatin (ZOCOR) 20 MG tablet Take 1 tablet (20 mg total) by mouth daily. 90 tablet 3  . tamoxifen (NOLVADEX) 20 MG tablet Take 1 tablet (20 mg total) by mouth daily. 90 tablet 3  . telmisartan (MICARDIS) 80 MG tablet Take 1 tablet (80 mg total) by mouth daily. 90 tablet 3  . UNABLE TO FIND Please fit patient for a compression sleeve.  Diagnosis:  Bilateral breast cancer 1 each 0   No current facility-administered medications for this visit.     Allergies:   Amlodipine; Lanolin; and Tape    Social History:  The patient  reports that  has never smoked. she has never used smokeless tobacco. She reports that she drinks about 2.4 oz of alcohol per week. She reports that she does not use drugs.   Family History:  The patient's family history includes Dementia in her mother; Heart attack in her maternal grandmother; Heart failure in her father; Hyperlipidemia in her mother; Hypertension in her maternal grandfather, mother, and son; Stroke in her father and paternal grandfather; Uterine cancer (age of onset: 31) in her sister.    ROS:  Please see the history of present illness.   Otherwise, review of systems are positive for cold hands and feet.   All other systems are reviewed and negative.    PHYSICAL EXAM: VS:  BP 130/68   Pulse (!) 56   Ht 4\' 11"  (1.499 m)   Wt 182 lb 12.8 oz (82.9 kg)   BMI 36.92 kg/m  , BMI Body mass index is 36.92 kg/m. GENERAL:  Well appearing.  No acute distress HEENT: Pupils equal round and reactive, fundi not visualized, oral mucosa unremarkable NECK:  No jugular venous distention, waveform within normal limits, carotid upstroke brisk and symmetric, no bruits, no thyromegaly LUNGS:  Clear to auscultation bilaterally HEART:  RRR.  PMI not displaced or sustained,S1 and S2 within normal limits, no S3, no S4, no clicks, no rubs,  I/VI systolic murmur at the LUSB ABD:  Flat, positive bowel sounds normal in frequency in pitch, no bruits, no rebound, no guarding, no midline pulsatile mass, no hepatomegaly, no splenomegaly EXT:  2 plus pulses throughout, no edema, no cyanosis no clubbing SKIN:  No rashes no nodules NEURO:  Cranial nerves II through XII grossly intact, motor grossly intact throughout PSYCH:  Cognitively intact, oriented to person place and time   EKG:  EKG is ordered today. The ekg ordered 05/03/16 demonstrates sinus rhythm.  Rate 67 bpm.  L axis deviation.  Absent R wave progression 07/12/17:  Sinus bradycardia.  Rate 56 bpm.  LAFB.  Incomplete right bundle branch block.  Poor R wave progression.  Exercise Myoview 05/11/16:   The left ventricular ejection fraction is hyperdynamic (>65%).  Nuclear stress EF: 71%.  Blood pressure demonstrated a normal response to exercise.  There was no ST segment deviation noted during stress.  The study is normal.   Normal stress nuclear study with no ischemia or infarction; EF 71 with normal wall motion.    Recent Labs: 05/29/2017: ALT 19; BUN 13.5; Creatinine 0.8; HGB 13.0; Platelets 164; Potassium 3.9; Sodium 142    Lipid Panel    Component Value Date/Time   CHOL 173 03/22/2017 1230   TRIG 156.0 (H) 03/22/2017 1230   HDL 56.20 03/22/2017 1230   CHOLHDL 3 03/22/2017 1230   VLDL 31.2 03/22/2017 1230   LDLCALC 85 03/22/2017 1230      Wt Readings from Last 3 Encounters:  07/12/17 182 lb 12.8 oz (82.9 kg)  05/29/17 187 lb (84.8 kg)  03/22/17 182 lb 3.2 oz (82.6 kg)      ASSESSMENT AND PLAN:  # Exertional dyspnea:  Resolved.  Symptoms have improved with training and by switching metoprolol to carvedilol.  # Hypertension: Blood pressure was initially elevated but improved on repeat..  Continue carvedilol, amiloride, carvedilol, HCTZ and telmisartan.   # Hyperlipidemia: Continue simvastatin.  LDL 85 03/2017.   Current medicines are reviewed at  length with the patient today.  The patient does not have concerns regarding medicines.  The following changes have been made:  no change  Labs/ tests ordered today include:   No orders of the defined types were placed in this encounter.    Disposition:   FU with Thao Vanover C. Oval Linsey, MD, [facility] in 1 year.  She prefers to keep one year follow up for prevention.    This note was written with the assistance of speech recognition software.  Please excuse any transcriptional errors.  Signed, Tayanna Talford C. Oval Linsey, MD, [facility]  07/12/2017 1:08 PM    [facility]

## 2017-07-20 DIAGNOSIS — L738 Other specified follicular disorders: Secondary | ICD-10-CM | POA: Diagnosis not present

## 2017-07-20 DIAGNOSIS — L918 Other hypertrophic disorders of the skin: Secondary | ICD-10-CM | POA: Diagnosis not present

## 2017-07-20 DIAGNOSIS — L821 Other seborrheic keratosis: Secondary | ICD-10-CM | POA: Diagnosis not present

## 2017-07-20 DIAGNOSIS — D225 Melanocytic nevi of trunk: Secondary | ICD-10-CM | POA: Diagnosis not present

## 2017-07-20 DIAGNOSIS — D1801 Hemangioma of skin and subcutaneous tissue: Secondary | ICD-10-CM | POA: Diagnosis not present

## 2017-07-20 DIAGNOSIS — Z85828 Personal history of other malignant neoplasm of skin: Secondary | ICD-10-CM | POA: Diagnosis not present

## 2017-08-07 ENCOUNTER — Ambulatory Visit (INDEPENDENT_AMBULATORY_CARE_PROVIDER_SITE_OTHER): Payer: Medicare Other | Admitting: Family Medicine

## 2017-08-07 ENCOUNTER — Encounter: Payer: Self-pay | Admitting: Family Medicine

## 2017-08-07 VITALS — BP 131/63 | HR 66 | Temp 98.8°F | Ht 59.0 in | Wt 180.2 lb

## 2017-08-07 DIAGNOSIS — J209 Acute bronchitis, unspecified: Secondary | ICD-10-CM

## 2017-08-07 DIAGNOSIS — R52 Pain, unspecified: Secondary | ICD-10-CM

## 2017-08-07 DIAGNOSIS — J0110 Acute frontal sinusitis, unspecified: Secondary | ICD-10-CM

## 2017-08-07 LAB — POCT INFLUENZA A/B
INFLUENZA A, POC: NEGATIVE
INFLUENZA B, POC: NEGATIVE

## 2017-08-07 MED ORDER — CEFDINIR 300 MG PO CAPS: 300.0000 mg | ORAL_CAPSULE | Freq: Two times a day (BID) | ORAL | 0 refills | Status: DC

## 2017-08-07 NOTE — Patient Instructions (Signed)
We will treat you for a sinus infection and bronchitis with omnicef.  Take this twice a day for 10 days Please let me know if you are not feeling better in the next few days- Sooner if worse.

## 2017-08-07 NOTE — Progress Notes (Signed)
[place] at [facility] [address] [place], [place] [address] [phone]  Date:  08/07/2017   Name:  Laura Bush   DOB:  02/10/1951   MRN:  [id number]  PCP:  Darreld Mclean, MD    Chief Complaint: Generalized Body Aches (c/o body aches, chills and fatigue x 10 days. Pt had flu vaccine this season. )   History of Present Illness:  Laura Bush is a 66 y.o. very pleasant female patient who presents with the following:  History of breast cancer, high cholesterol, HTN Here today with concern of illness  She first got sick about 10 days ago and has not seemed to get better She is using mucinex, sudafed, ibuprofen, delsym She has not noted any fevers, but she had had chills She feels achy today She does have a productive cough A few weeks ago her grand-kids had croup.  No GI symptoms  She is having annual breast MRI right now for breast cancer surveillance  She also recently saw cardiology for follow-up- she had a negative myoview in 2017  She feels like her sx are more in her sinuses than in her chest  Patient Active Problem List   Diagnosis Date Noted  . Malignant 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, estrogen receptor positive ([place]) 05/29/2017  . 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 Vitamin D deficiency 01/29/2016  . Preventative health care 01/29/2016  . Hot flashes due to tamoxifen 10/05/2015  . Diarrhea 05/27/2015  . Eczema 12/29/2014  . Venous insufficiency 10/16/2014  . Essential hypertension 10/01/2014  . Hypercholesteremia 10/01/2014  . GERD (gastroesophageal reflux disease) 10/01/2014    Past Medical History:  Diagnosis Date  . Asthma    in cold weather  . Breast cancer ([place]) 09/18/14   left breast  . Breast cancer ([place]) 10/07/14   bx left breast  . Breast cancer of upper-outer quadrant of left female breast ([place]) 09/22/2014  . Cluster headaches    in  her 40's  . GERD (gastroesophageal reflux disease)   . Heart murmur    as a child (has outgrown)  . High cholesterol   . Hypertension   . Microscopic colitis   . OSA on CPAP   . Pneumonia 2000's X 1  . PONV (postoperative nausea and vomiting)   . Rosacea   . S/P radiation therapy 12/30/14-01/27/15   left breast 50Gy total dose  . 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 Venous insufficiency     Past Surgical History:  Procedure Laterality Date  . ABDOMINAL HYSTERECTOMY  1999  . APPENDECTOMY  ~ 2006  . BREAST BIOPSY Left 10/2014; 10/2014  . BREAST REDUCTION SURGERY Bilateral 11/11/2014   Procedure: Bilateral Breast Reduction;  Surgeon: Crissie Reese, MD;  Location: [place];  Service: Plastics;  Laterality: Bilateral;  . CARPAL TUNNEL RELEASE Bilateral    2 surgeries on right, 1 on left  . CESAREAN SECTION  1978; 1980  . COLONOSCOPY    . FOREARM FRACTURE SURGERY Right 2003 X 3  . FRACTURE SURGERY    . KNEE ARTHROSCOPY Bilateral    meniscus repair  . MASTECTOMY COMPLETE / SIMPLE W/ SENTINEL NODE BIOPSY Left 11/11/2014  . PARTIAL MASTECTOMY WITH AXILLARY SENTINEL LYMPH NODE BIOPSY Left 11/11/2014  . PARTIAL MASTECTOMY WITH NEEDLE LOCALIZATION AND AXILLARY SENTINEL LYMPH NODE BX Left 11/11/2014   Procedure: LEFT BREAST PARTIAL MASTECTOMY WITH  NEEDLE LOCALIZATION TIMES TWO AND LEFT AXILLARY SENTINEL LYMPH NODE Biopsy;  Surgeon: Fanny Skates, MD;  Location: [facility];  Service: General;  Laterality: Left;  . REDUCTION MAMMAPLASTY Bilateral 11/11/2014  . SQUAMOUS CELL CARCINOMA EXCISION  1980's X 1   "nose"  . TONSILLECTOMY  ~ 1957  . TUBAL LIGATION  ?1984    Social History   Tobacco Use  . Smoking status: Never Smoker  . Smokeless tobacco: Never Used  Substance Use Topics  . Alcohol use: Yes    Alcohol/week: 2.4 oz    Types: 4 Glasses of wine per week  . Drug use: No    Family History  Problem Relation Age of Onset  . Uterine cancer Sister 23  . Stroke Father        deceased  . Heart  failure Father   . Dementia Mother        Lives in [place]  . Hypertension Mother   . Hyperlipidemia Mother   . Heart attack Maternal Grandmother   . Hypertension Maternal Grandfather   . Stroke Paternal Grandfather   . Hypertension Son     Allergies  Allergen Reactions  . Amlodipine Swelling  . Lanolin   . Tape Other (See Comments)    Redness (Bandaids also)    Medication list has been reviewed and updated.  Current Outpatient Medications on File Prior to Visit  Medication Sig Dispense Refill  . acetaminophen (TYLENOL) 500 MG tablet Take 500 mg by mouth every 6 (six) hours as needed.    Marland Kitchen aMILoride (MIDAMOR) 5 MG tablet Take 1 tablet (5 mg total) by mouth daily. 90 tablet 3  . B Complex-C (B-COMPLEX WITH VITAMIN C) tablet Take 1 tablet by mouth daily.    . carvedilol (COREG) 12.5 MG tablet Take 12.5 mg by mouth 2 (two) times daily with a meal.    . Cholecalciferol (VITAMIN D3) 2000 UNITS TABS Take 1 tablet by mouth daily.    Marland Kitchen gabapentin (NEURONTIN) 300 MG capsule Take 1 capsule (300 mg total) by mouth at bedtime. 90 capsule 4  . hydrochlorothiazide (MICROZIDE) 12.5 MG capsule Take 1 capsule (12.5 mg total) by mouth daily. 90 capsule 3  . simvastatin (ZOCOR) 20 MG tablet Take 1 tablet (20 mg total) by mouth daily. 90 tablet 3  . tamoxifen (NOLVADEX) 20 MG tablet Take 1 tablet (20 mg total) by mouth daily. 90 tablet 3  . telmisartan (MICARDIS) 80 MG tablet Take 1 tablet (80 mg total) by mouth daily. 90 tablet 3  . UNABLE TO FIND Please fit patient for a compression sleeve.  Diagnosis:  Bilateral breast cancer 1 each 0   No current facility-administered medications on file prior to visit.     Review of Systems:  As per HPI- otherwise negative. No rash No vomiting Her teeth feel achy like a sinus infection   Physical Examination: Vitals:   08/07/17 1216  BP: 131/63  Pulse: 66  Temp: 98.8 F (37.1 C)  SpO2: 97%   Vitals:   08/07/17 1216  Weight: 180 lb 3.2 oz  (81.7 kg)  Height: 4\' 11"  (1.499 m)   Body mass index is 36.4 kg/m. Ideal Body Weight: Weight in (lb) to have BMI = 25: 123.5  GEN: WDWN, NAD, Non-toxic, A & O x 3, obese, looks well  HEENT: Atraumatic, Normocephalic. Neck supple. No masses, No LAD. Bilateral TM wnl, oropharynx normal.  PEERL,EOMI.   Frontal sinuses are tender to percussion Ears and Nose: No external deformity. CV:  RRR, No M/G/R. No JVD. No thrill. No extra heart sounds. PULM: CTA B, no wheezes, crackles, rhonchi. No retractions. No resp. distress. No accessory muscle use. EXTR: No c/c/e NEURO Normal gait.  PSYCH: Normally interactive. Conversant. Not depressed or anxious appearing.  Calm demeanor.    Assessment and Plan: Acute non-recurrent frontal sinusitis - Plan: cefdinir (OMNICEF) 300 MG capsule  Body aches - Plan: POCT Influenza A/B  Acute bronchitis, unspecified organism - Plan: cefdinir (OMNICEF) 300 MG capsule  Treat with omnicef as above She will alert me if not feeling better soon- Sooner if worse.    Signed Lamar Blinks, MD

## 2017-08-14 ENCOUNTER — Other Ambulatory Visit: Payer: Self-pay | Admitting: Oncology

## 2017-08-14 DIAGNOSIS — R69 Illness, unspecified: Secondary | ICD-10-CM | POA: Diagnosis not present

## 2017-08-14 DIAGNOSIS — Z9889 Other specified postprocedural states: Secondary | ICD-10-CM

## 2017-08-31 ENCOUNTER — Ambulatory Visit
Admission: RE | Admit: 2017-08-31 | Discharge: 2017-08-31 | Disposition: A | Discharge status: Home / Self Care | Payer: Medicare HMO | Source: Ambulatory Visit | Attending: Oncology | Admitting: Oncology

## 2017-08-31 DIAGNOSIS — Z9889 Other specified postprocedural states: Secondary | ICD-10-CM

## 2017-08-31 DIAGNOSIS — R928 Other abnormal and inconclusive findings on diagnostic imaging of breast: Secondary | ICD-10-CM | POA: Diagnosis not present

## 2017-08-31 HISTORY — DX: Personal history of irradiation: Z92.3

## 2017-09-26 NOTE — Progress Notes (Addendum)
[place] at [facility] [address] [place], [place] [address] [phone]  Date:  09/27/2017   Name:  Laura Bush   DOB:  08-31-1950   MRN:  [id number]  PCP:  Darreld Mclean, MD    Chief Complaint: Gynecologic Exam (Pt here for f/u and PAP. )   History of Present Illness:  Laura Bush is a 67 y.o. very pleasant female patient who presents with the following:  6 month follow-up visit today from our visit in August- lab notes from that visit:  Your blood count is normal  Metabolic profile is normal (potassium balance is fine)  Cholesterol looks very good  Vitamin D level is normal  Your labs overall look great!  Take care and let's plan to visit in about 6 months, We can do a pap at that visit if you like   History of DCIS right, HTN, high cholesterol She continues to see oncology on a regular basis for surveillance of her breast cancer Also had a recent stress with cardiology- negative  She has twin grand-kids in this area- one boy and one girl, they will be 3 in May Her other daughter in [place] is also expecting a baby girl who will join her 1.5 yo son I also did see her in December for a sinus infection- she is feeling better in this regard. The abx did bother her stomach but this is better now.   She does have raynauds sx in her right hand- middle 3 fingers- 1 or 2x a week. She notes that her index, long and ring fingers may turn white in color and feel very cold She has noted this for a year or so now.  It is worse in the colder months.  It will get better if she wams the fingers up It is annoying as she likes to sew a lot and sometimes the fingers will be difficult to use  Her left hand bothers her some as well, in a different way.  She has noted this for a couple of months No known injury She does not feel like her hand is stiff. It is more that she has pain and aching in the dorsal, distal  wrist NKI  She slipped on black ice and severely broke her right arm in 2003- she had 2 operations and 40 weeks of PT  Her right wrist is not as mobile as the left  She also needs a refill of her tamoxifen and gabapentin- these are generally rx by Dr. Jana Hakim. However she needs the rx changed to a mail order pharmacy, so I will refill for her today   Patient Active Problem List   Diagnosis Date Noted  . Malignant 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, estrogen receptor positive ([place]) 05/29/2017  . 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 Vitamin D deficiency 01/29/2016  . Preventative health care 01/29/2016  . Hot flashes due to tamoxifen 10/05/2015  . Diarrhea 05/27/2015  . Eczema 12/29/2014  . Venous insufficiency 10/16/2014  . Essential hypertension 10/01/2014  . Hypercholesteremia 10/01/2014  . GERD (gastroesophageal reflux disease) 10/01/2014    Past Medical History:  Diagnosis Date  . Asthma    in cold weather  . Breast cancer ([place]) 09/18/14   left breast  . Breast cancer ([place]) 10/07/14   bx left breast  . Breast cancer of upper-outer quadrant of left female breast (  [place]) 09/22/2014  . Cluster headaches    in her 40's  . GERD (gastroesophageal reflux disease)   . Heart murmur    as a child (has outgrown)  . High cholesterol   . Hypertension   . Microscopic colitis   . OSA on CPAP   . Personal history of radiation therapy   . Pneumonia 2000's X 1  . PONV (postoperative nausea and vomiting)   . Rosacea   . S/P radiation therapy 12/30/14-01/27/15   left breast 50Gy total dose  . 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 Venous insufficiency     Past Surgical History:  Procedure Laterality Date  . ABDOMINAL HYSTERECTOMY  1999  . APPENDECTOMY  ~ 2006  . BREAST BIOPSY Left 10/2014; 10/2014  . BREAST LUMPECTOMY    . BREAST REDUCTION SURGERY Bilateral 11/11/2014   Procedure: Bilateral Breast Reduction;  Surgeon: Crissie Reese, MD;   Location: [facility];  Service: Plastics;  Laterality: Bilateral;  . CARPAL TUNNEL RELEASE Bilateral    2 surgeries on right, 1 on left  . CESAREAN SECTION  1978; 1980  . COLONOSCOPY    . FOREARM FRACTURE SURGERY Right 2003 X 3  . FRACTURE SURGERY    . KNEE ARTHROSCOPY Bilateral    meniscus repair  . PARTIAL MASTECTOMY WITH AXILLARY SENTINEL LYMPH NODE BIOPSY Left 11/11/2014  . PARTIAL MASTECTOMY WITH NEEDLE LOCALIZATION AND AXILLARY SENTINEL LYMPH NODE BX Left 11/11/2014   Procedure: LEFT BREAST PARTIAL MASTECTOMY WITH NEEDLE LOCALIZATION TIMES TWO AND LEFT AXILLARY SENTINEL LYMPH NODE Biopsy;  Surgeon: Fanny Skates, MD;  Location: [facility];  Service: General;  Laterality: Left;  . REDUCTION MAMMAPLASTY Bilateral 11/11/2014  . SQUAMOUS CELL CARCINOMA EXCISION  1980's X 1   "nose"  . TONSILLECTOMY  ~ 1957  . TUBAL LIGATION  ?1984    Social History   Tobacco Use  . Smoking status: Never Smoker  . Smokeless tobacco: Never Used  Substance Use Topics  . Alcohol use: Yes    Alcohol/week: 2.4 oz    Types: 4 Glasses of wine per week  . Drug use: No    Family History  Problem Relation Age of Onset  . Uterine cancer Sister 38  . Stroke Father        deceased  . Heart failure Father   . Dementia Mother        Lives in [place]  . Hypertension Mother   . Hyperlipidemia Mother   . Heart attack Maternal Grandmother   . Hypertension Maternal Grandfather   . Stroke Paternal Grandfather   . Hypertension Son     Allergies  Allergen Reactions  . Amlodipine Swelling  . Lanolin   . Tape Other (See Comments)    Redness (Bandaids also)    Medication list has been reviewed and updated.  Current Outpatient Medications on File Prior to Visit  Medication Sig Dispense Refill  . aMILoride (MIDAMOR) 5 MG tablet Take 1 tablet (5 mg total) by mouth daily. 90 tablet 3  . B Complex-C (B-COMPLEX WITH VITAMIN C) tablet Take 1 tablet by mouth daily.    . carvedilol (COREG) 12.5 MG tablet Take 12.5 mg by  mouth 2 (two) times daily with a meal.    . cefdinir (OMNICEF) 300 MG capsule Take 1 capsule (300 mg total) by mouth 2 (two) times daily. 20 capsule 0  . Cholecalciferol (VITAMIN D3) 2000 UNITS TABS Take 1 tablet by mouth daily.    . hydrochlorothiazide (MICROZIDE) 12.5 MG capsule  Take 1 capsule (12.5 mg total) by mouth daily. 90 capsule 3  . simvastatin (ZOCOR) 20 MG tablet Take 1 tablet (20 mg total) by mouth daily. 90 tablet 3  . telmisartan (MICARDIS) 80 MG tablet Take 1 tablet (80 mg total) by mouth daily. 90 tablet 3  . UNABLE TO FIND Please fit patient for a compression sleeve.  Diagnosis:  Bilateral breast cancer 1 each 0   No current facility-administered medications on file prior to visit.     Review of Systems:  As per HPI- otherwise negative. No fever or chills S/p hyst for benign disease    Physical Examination: Vitals:   09/27/17 1026  BP: 116/80  Pulse: 64  Temp: 98.5 F (36.9 C)  SpO2: 97%   Vitals:   09/27/17 1026  Weight: 185 lb (83.9 kg)  Height: 4\' 11"  (1.499 m)   Body mass index is 37.37 kg/m. Ideal Body Weight: Weight in (lb) to have BMI = 25: 123.5  GEN: WDWN, NAD, Non-toxic, A & O x 3 HEENT: Atraumatic, Normocephalic. Neck supple. No masses, No LAD. Ears and Nose: No external deformity. CV: RRR, No M/G/R. No JVD. No thrill. No extra heart sounds. PULM: CTA B, no wheezes, crackles, rhonchi. No retractions. No resp. distress. No accessory muscle use. ABD: S, NT, ND, +BS. No rebound. No HSM. EXTR: No c/c/e NEURO Normal gait.  PSYCH: Normally interactive. Conversant. Not depressed or anxious appearing.  Calm demeanor. e Pelvic: normal, no vaginal lesions or discharge. Cervix and uterus surgically removed, no adnexal tendereness or masses Right forearm shows some scarring from past orthopedic surgery Left wrist has normal ROM, but she does have tenderness to pressure over the dorsal wrist over the carpal bones No redness, swelling, or heat noted    Assessment and Plan: Screening for vaginal cancer - Plan: Cytology - PAP  Ductal carcinoma in situ (DCIS) of right breast - Plan: tamoxifen (NOLVADEX) 20 MG tablet  Left wrist pain - Plan: DG Wrist Complete Left  Hot flashes - Plan: gabapentin (NEURONTIN) 300 MG capsule  Refilled her tamoxifen and gabapentin today Pap pending Wrist x-rays today.  Suggested she try an OTC wrist splint for a week or two- she will let me know if not helpful followup in August   Signed Lamar Blinks, MD Received wrist films- message to pt  Dg Wrist Complete Left  Result Date: 09/27/2017 CLINICAL DATA:  Chronic pain EXAM: LEFT WRIST - COMPLETE 3+ VIEW COMPARISON:  None. FINDINGS: Frontal, oblique, lateral, and ulnar deviation scaphoid images were obtained. There is no appreciable fracture or dislocation. There is moderate joint space narrowing in the scaphotrapezial joint. There is mild osteoarthritic change in the first carpal-metacarpal joint. There is no erosive change or periostitis. 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 No erosive change. No fracture or dislocation. Electronically Signed   By: Lowella Grip III M.D.   On: 09/27/2017 11:45   Received her pap Results for orders placed or performed in visit on 09/27/17  Cytology - PAP  Result Value Ref Range   Adequacy Satisfactory for evaluation.    Diagnosis      NEGATIVE FOR INTRAEPITHELIAL LESIONS OR MALIGNANCY.   HPV NOT DETECTED    Material Submitted Vaginal Pap [ThinPrep Imaged]    Message to pt

## 2017-09-27 ENCOUNTER — Ambulatory Visit (HOSPITAL_BASED_OUTPATIENT_CLINIC_OR_DEPARTMENT_OTHER)
Admission: RE | Admit: 2017-09-27 | Discharge: 2017-09-27 | Disposition: A | Discharge status: Home / Self Care | Payer: Medicare HMO | Source: Ambulatory Visit | Attending: Family Medicine | Admitting: Family Medicine

## 2017-09-27 ENCOUNTER — Encounter: Payer: Self-pay | Admitting: Family Medicine

## 2017-09-27 ENCOUNTER — Other Ambulatory Visit (HOSPITAL_COMMUNITY)
Admission: RE | Admit: 2017-09-27 | Discharge: 2017-09-27 | Disposition: A | Discharge status: Home / Self Care | Payer: Medicare HMO | Source: Ambulatory Visit | Attending: Family Medicine | Admitting: Family Medicine

## 2017-09-27 ENCOUNTER — Ambulatory Visit (INDEPENDENT_AMBULATORY_CARE_PROVIDER_SITE_OTHER): Payer: Medicare HMO | Admitting: Family Medicine

## 2017-09-27 VITALS — BP 116/80 | HR 64 | Temp 98.5°F | Ht 59.0 in | Wt 185.0 lb

## 2017-09-27 DIAGNOSIS — Z823 Family history of stroke: Secondary | ICD-10-CM | POA: Insufficient documentation

## 2017-09-27 DIAGNOSIS — Z90710 Acquired absence of both cervix and uterus: Secondary | ICD-10-CM | POA: Diagnosis not present

## 2017-09-27 DIAGNOSIS — Z8249 Family history of ischemic heart disease and other diseases of the circulatory system: Secondary | ICD-10-CM | POA: Insufficient documentation

## 2017-09-27 DIAGNOSIS — Z808 Family history of malignant neoplasm of other organs or systems: Secondary | ICD-10-CM | POA: Insufficient documentation

## 2017-09-27 DIAGNOSIS — Z91048 Other nonmedicinal substance allergy status: Secondary | ICD-10-CM | POA: Diagnosis not present

## 2017-09-27 DIAGNOSIS — Z923 Personal history of irradiation: Secondary | ICD-10-CM | POA: Diagnosis not present

## 2017-09-27 DIAGNOSIS — Z888 Allergy status to other drugs, medicaments and biological substances status: Secondary | ICD-10-CM | POA: Diagnosis not present

## 2017-09-27 DIAGNOSIS — Z9889 Other specified postprocedural states: Secondary | ICD-10-CM | POA: Insufficient documentation

## 2017-09-27 DIAGNOSIS — G4733 Obstructive sleep apnea (adult) (pediatric): Secondary | ICD-10-CM | POA: Insufficient documentation

## 2017-09-27 DIAGNOSIS — Z1272 Encounter for screening for malignant neoplasm of vagina: Secondary | ICD-10-CM | POA: Diagnosis not present

## 2017-09-27 DIAGNOSIS — Z170 Estrogen receptor positive status [ER+]: Secondary | ICD-10-CM | POA: Diagnosis not present

## 2017-09-27 DIAGNOSIS — K219 Gastro-esophageal reflux disease without esophagitis: Secondary | ICD-10-CM | POA: Insufficient documentation

## 2017-09-27 DIAGNOSIS — M19032 Primary osteoarthritis, left wrist: Secondary | ICD-10-CM | POA: Diagnosis not present

## 2017-09-27 DIAGNOSIS — R232 Flushing: Secondary | ICD-10-CM

## 2017-09-27 DIAGNOSIS — M25532 Pain in left wrist: Secondary | ICD-10-CM

## 2017-09-27 DIAGNOSIS — E7800 Pure hypercholesterolemia, unspecified: Secondary | ICD-10-CM | POA: Diagnosis not present

## 2017-09-27 DIAGNOSIS — Z79899 Other long term (current) drug therapy: Secondary | ICD-10-CM | POA: Diagnosis not present

## 2017-09-27 DIAGNOSIS — Z9989 Dependence on other enabling machines and devices: Secondary | ICD-10-CM | POA: Insufficient documentation

## 2017-09-27 DIAGNOSIS — C50812 Malignant neoplasm of overlapping sites of left female breast: Secondary | ICD-10-CM | POA: Diagnosis not present

## 2017-09-27 DIAGNOSIS — I10 Essential (primary) hypertension: Secondary | ICD-10-CM | POA: Diagnosis not present

## 2017-09-27 DIAGNOSIS — D0511 Intraductal carcinoma in situ of right breast: Secondary | ICD-10-CM

## 2017-09-27 MED ORDER — GABAPENTIN 300 MG PO CAPS: 300.0000 mg | ORAL_CAPSULE | Freq: Every day | ORAL | 3 refills | Status: DC

## 2017-09-27 MED ORDER — TAMOXIFEN CITRATE 20 MG PO TABS: 20.0000 mg | ORAL_TABLET | Freq: Every day | ORAL | 3 refills | Status: DC

## 2017-09-27 NOTE — Patient Instructions (Signed)
It was very nice to see you today- I will be in touch with your pap and wrist x-ray reports asap Try an OTC wrist splint on your left hand as needed for the next 2 weeks- let me know if not helpful for you  Take care- let's visit in August

## 2017-09-28 ENCOUNTER — Encounter: Payer: Self-pay | Admitting: Family Medicine

## 2017-09-28 LAB — CYTOLOGY - PAP
DIAGNOSIS: NEGATIVE
HPV (WINDOPATH): NOT DETECTED

## 2017-12-04 ENCOUNTER — Ambulatory Visit (INDEPENDENT_AMBULATORY_CARE_PROVIDER_SITE_OTHER): Payer: Medicare HMO | Admitting: Family Medicine

## 2017-12-04 ENCOUNTER — Encounter: Payer: Self-pay | Admitting: Family Medicine

## 2017-12-04 VITALS — BP 141/73 | HR 57 | Temp 97.9°F | Resp 16 | Ht 59.0 in | Wt 189.0 lb

## 2017-12-04 DIAGNOSIS — Z8639 Personal history of other endocrine, nutritional and metabolic disease: Secondary | ICD-10-CM | POA: Diagnosis not present

## 2017-12-04 DIAGNOSIS — R5383 Other fatigue: Secondary | ICD-10-CM | POA: Diagnosis not present

## 2017-12-04 DIAGNOSIS — J0111 Acute recurrent frontal sinusitis: Secondary | ICD-10-CM | POA: Diagnosis not present

## 2017-12-04 MED ORDER — AMOXICILLIN-POT CLAVULANATE 875-125 MG PO TABS: 1.0000 | ORAL_TABLET | Freq: Two times a day (BID) | ORAL | 0 refills | Status: DC

## 2017-12-04 NOTE — Patient Instructions (Signed)
It was good to see you today- I will be in touch with your labs asap to look for any other explanation for your fatigue We will treat for sinusitis/ bronchitis with agumentin for 10 days- use a probiotic and/ or eat yogurt daily with this!  Please let me know if you are not feeling better in the next few days- Sooner if worse.

## 2017-12-04 NOTE — Progress Notes (Addendum)
[facility] at [facility] [address], [place], [place] [address] [phone]  Date:  12/04/2017   Name:  Laura Bush   DOB:  16-Mar-1951   MRN:  [id number]  PCP:  Darreld Mclean, MD    Chief Complaint: Allergies (x3 weeks, cough, congestion, sinus congestion, runny nose, fatigue, water eyes, sneezing, taking delsym, allegra and ibuprofen)   History of Present Illness:  Laura Bush is a 67 y.o. very pleasant female patient who presents with the following:  Here today with a sick visit- she has noted both chest and sinus congestion, watery eyes, cough, blowing her nose a lot, feeling fatigued She has not noted any body aches or chills, no fever She has been sick for about 3 weeks now- seemed to start with allergies and go from there  No GI symptoms She has tried allegra, delsym, sudafed, ibuprofen- will help for a couple of hours and then sx return   She has felt really fatigued- not always sleepy, but just tired She is using her CPAP and does feel that she is sleeping ok   Patient Active Problem List   Diagnosis Date Noted  . Malignant 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, estrogen receptor positive ([place]) 05/29/2017  . 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 Vitamin D deficiency 01/29/2016  . Preventative health care 01/29/2016  . Hot flashes due to tamoxifen 10/05/2015  . Diarrhea 05/27/2015  . Eczema 12/29/2014  . Venous insufficiency 10/16/2014  . Essential hypertension 10/01/2014  . Hypercholesteremia 10/01/2014  . GERD (gastroesophageal reflux disease) 10/01/2014    Past Medical History:  Diagnosis Date  . Asthma    in cold weather  . Breast cancer ([facility] [facility]) 09/18/14   left breast  . Breast cancer ([place]) 10/07/14   bx left breast  . Breast cancer of upper-outer quadrant of left female breast ([facility]) 09/22/2014  . Cluster headaches    in her 40's  . GERD (gastroesophageal  reflux disease)   . Heart murmur    as a child (has outgrown)  . High cholesterol   . Hypertension   . Microscopic colitis   . OSA on CPAP   . Personal history of radiation therapy   . Pneumonia 2000's X 1  . PONV (postoperative nausea and vomiting)   . Rosacea   . S/P radiation therapy 12/30/14-01/27/15   left breast 50Gy total dose  . 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 Venous insufficiency     Past Surgical History:  Procedure Laterality Date  . ABDOMINAL HYSTERECTOMY  1999  . APPENDECTOMY  ~ 2006  . BREAST BIOPSY Left 10/2014; 10/2014  . BREAST LUMPECTOMY    . BREAST REDUCTION SURGERY Bilateral 11/11/2014   Procedure: Bilateral Breast Reduction;  Surgeon: Crissie Reese, MD;  Location: [facility];  Service: Plastics;  Laterality: Bilateral;  . CARPAL TUNNEL RELEASE Bilateral    2 surgeries on right, 1 on left  . CESAREAN SECTION  1978; 1980  . COLONOSCOPY    . FOREARM FRACTURE SURGERY Right 2003 X 3  . FRACTURE SURGERY    . KNEE ARTHROSCOPY Bilateral    meniscus repair  . PARTIAL MASTECTOMY WITH AXILLARY SENTINEL LYMPH NODE BIOPSY Left 11/11/2014  . PARTIAL MASTECTOMY WITH NEEDLE LOCALIZATION AND AXILLARY SENTINEL LYMPH NODE BX Left 11/11/2014   Procedure: LEFT BREAST PARTIAL MASTECTOMY WITH NEEDLE LOCALIZATION TIMES TWO AND LEFT AXILLARY SENTINEL LYMPH  NODE Biopsy;  Surgeon: Fanny Skates, MD;  Location: [facility];  Service: General;  Laterality: Left;  . REDUCTION MAMMAPLASTY Bilateral 11/11/2014  . SQUAMOUS CELL CARCINOMA EXCISION  1980's X 1   "nose"  . TONSILLECTOMY  ~ 1957  . TUBAL LIGATION  ?1984    Social History   Tobacco Use  . Smoking status: Never Smoker  . Smokeless tobacco: Never Used  Substance Use Topics  . Alcohol use: Yes    Alcohol/week: 2.4 oz    Types: 4 Glasses of wine per week  . Drug use: No    Family History  Problem Relation Age of Onset  . Uterine cancer Sister 70  . Stroke Father        deceased  . Heart failure Father   . Dementia Mother         Lives in [place]  . Hypertension Mother   . Hyperlipidemia Mother   . Heart attack Maternal Grandmother   . Hypertension Maternal Grandfather   . Stroke Paternal Grandfather   . Hypertension Son     Allergies  Allergen Reactions  . Amlodipine Swelling  . Lanolin   . Tape Other (See Comments)    Redness (Bandaids also)    Medication list has been reviewed and updated.  Current Outpatient Medications on File Prior to Visit  Medication Sig Dispense Refill  . aMILoride (MIDAMOR) 5 MG tablet Take 1 tablet (5 mg total) by mouth daily. 90 tablet 3  . B Complex-C (B-COMPLEX WITH VITAMIN C) tablet Take 1 tablet by mouth daily.    . carvedilol (COREG) 12.5 MG tablet Take 12.5 mg by mouth 2 (two) times daily with a meal.    . Cholecalciferol (VITAMIN D3) 2000 UNITS TABS Take 1 tablet by mouth daily.    Marland Kitchen gabapentin (NEURONTIN) 300 MG capsule Take 1 capsule (300 mg total) by mouth at bedtime. 90 capsule 3  . hydrochlorothiazide (MICROZIDE) 12.5 MG capsule Take 1 capsule (12.5 mg total) by mouth daily. 90 capsule 3  . simvastatin (ZOCOR) 20 MG tablet Take 1 tablet (20 mg total) by mouth daily. 90 tablet 3  . tamoxifen (NOLVADEX) 20 MG tablet Take 1 tablet (20 mg total) by mouth daily. 90 tablet 3  . telmisartan (MICARDIS) 80 MG tablet Take 1 tablet (80 mg total) by mouth daily. 90 tablet 3  . UNABLE TO FIND Please fit patient for a compression sleeve.  Diagnosis:  Bilateral breast cancer 1 each 0   No current facility-administered medications on file prior to visit.     Review of Systems:  As per HPI- otherwise negative.  No vomiting or diarrhea No one sick at home  Pulse Readings from Last 3 Encounters:  12/04/17 (!) 57  09/27/17 64  08/07/17 66   BP Readings from Last 3 Encounters:  12/04/17 (!) 141/73  09/27/17 116/80  08/07/17 131/63     Physical Examination: Vitals:   12/04/17 1413  BP: (!) 141/73  Pulse: (!) 57  Resp: 16  Temp: 97.9 F (36.6 C)  SpO2: 99%    Vitals:   12/04/17 1413  Weight: 189 lb (85.7 kg)  Height: 4\' 11"  (1.499 m)   Body mass index is 38.17 kg/m. Ideal Body Weight: Weight in (lb) to have BMI = 25: 123.5  GEN: WDWN, NAD, Non-toxic, A & O x 3, obese, looks well  HEENT: Atraumatic, Normocephalic. Neck supple. No masses, No LAD.  Bilateral TM wnl, oropharynx normal.  PEERL,EOMI.   Nasal cavity is  inflamed esp on left, and her frontal sinuses are tender to percussion Ears and Nose: No external deformity. CV: RRR, No M/G/R. No JVD. No thrill. No extra heart sounds. PULM: CTA B, no wheezes, crackles, rhonchi. No retractions. No resp. distress. No accessory muscle use. EXTR: No c/c/e NEURO Normal gait.  PSYCH: Normally interactive. Conversant. Not depressed or anxious appearing.  Calm demeanor.    Assessment and Plan: Acute recurrent frontal sinusitis - Plan: amoxicillin-clavulanate (AUGMENTIN) 875-125 MG tablet  Fatigue, unspecified type - Plan: Ferritin, CBC, TSH, Basic metabolic panel  History of iron deficiency - Plan: Ferritin  Sinus infection and possible bronchitis Treat with augmentin Labs pending as above for fatigue  Signed Lamar Blinks, MD Received her labs 4/30, message to pt: Your ferritin- iron- level is normal Blood counts are normal Metabolic profile is normal except for slightly high blood sugar.  However, assuming you were NOT fasting this number is actually ok No evident cause of your fatigue.  Please let me know if this continues to be a concern for you JC Results for orders placed or performed in visit on 12/04/17  Ferritin  Result Value Ref Range   Ferritin 112.0 10.0 - 291.0 ng/mL  CBC  Result Value Ref Range   WBC 4.7 4.0 - 10.5 K/uL   RBC 3.72 (L) 3.87 - 5.11 Mil/uL   Platelets 199.0 150.0 - 400.0 K/uL   Hemoglobin 12.6 12.0 - 15.0 g/dL   HCT 36.5 36.0 - 46.0 %   MCV 98.1 78.0 - 100.0 fl   MCHC 34.4 30.0 - 36.0 g/dL   RDW 12.4 11.5 - 15.5 %  TSH  Result Value Ref Range    TSH 1.04 0.35 - 4.50 uIU/mL  Basic metabolic panel  Result Value Ref Range   Sodium 144 135 - 145 mEq/L   Potassium 3.5 3.5 - 5.1 mEq/L   Chloride 107 96 - 112 mEq/L   CO2 29 19 - 32 mEq/L   Glucose, Bld 135 (H) 70 - 99 mg/dL   BUN 15 6 - 23 mg/dL   Creatinine, Ser 0.79 0.40 - 1.20 mg/dL   Calcium 9.0 8.4 - 10.5 mg/dL   GFR 77.22 >60.00 mL/min

## 2017-12-05 ENCOUNTER — Encounter: Payer: Self-pay | Admitting: Family Medicine

## 2017-12-05 LAB — CBC
HEMATOCRIT: 36.5 % (ref 36.0–46.0)
Hemoglobin: 12.6 g/dL (ref 12.0–15.0)
MCHC: 34.4 g/dL (ref 30.0–36.0)
MCV: 98.1 fl (ref 78.0–100.0)
PLATELETS: 199 10*3/uL (ref 150.0–400.0)
RBC: 3.72 Mil/uL — AB (ref 3.87–5.11)
RDW: 12.4 % (ref 11.5–15.5)
WBC: 4.7 10*3/uL (ref 4.0–10.5)

## 2017-12-05 LAB — BASIC METABOLIC PANEL
BUN: 15 mg/dL (ref 6–23)
CALCIUM: 9 mg/dL (ref 8.4–10.5)
CHLORIDE: 107 meq/L (ref 96–112)
CO2: 29 meq/L (ref 19–32)
Creatinine, Ser: 0.79 mg/dL (ref 0.40–1.20)
GFR: 77.22 mL/min (ref >60.00)
GLUCOSE: 135 mg/dL — AB (ref 70–99)
Potassium: 3.5 mEq/L (ref 3.5–5.1)
Sodium: 144 mEq/L (ref 135–145)

## 2017-12-05 LAB — FERRITIN: Ferritin: 112 ng/mL (ref 10.0–291.0)

## 2017-12-05 LAB — TSH: TSH: 1.04 u[IU]/mL (ref 0.35–4.50)

## 2018-01-24 ENCOUNTER — Encounter: Payer: Self-pay | Admitting: Family Medicine

## 2018-02-10 ENCOUNTER — Other Ambulatory Visit: Payer: Medicare HMO

## 2018-02-14 DIAGNOSIS — R69 Illness, unspecified: Secondary | ICD-10-CM | POA: Diagnosis not present

## 2018-02-25 ENCOUNTER — Encounter: Payer: Self-pay | Admitting: Oncology

## 2018-02-26 ENCOUNTER — Other Ambulatory Visit: Payer: Self-pay | Admitting: *Deleted

## 2018-02-28 ENCOUNTER — Encounter: Payer: Self-pay | Admitting: Oncology

## 2018-03-01 ENCOUNTER — Other Ambulatory Visit: Payer: Self-pay | Admitting: *Deleted

## 2018-03-01 DIAGNOSIS — D0511 Intraductal carcinoma in situ of right breast: Secondary | ICD-10-CM

## 2018-03-01 DIAGNOSIS — C50812 Malignant neoplasm of overlapping sites of left female breast: Secondary | ICD-10-CM

## 2018-03-01 DIAGNOSIS — Z9189 Other specified personal risk factors, not elsewhere classified: Secondary | ICD-10-CM

## 2018-03-01 DIAGNOSIS — Z170 Estrogen receptor positive status [ER+]: Secondary | ICD-10-CM

## 2018-03-02 ENCOUNTER — Telehealth: Payer: Self-pay | Admitting: Oncology

## 2018-03-02 NOTE — Telephone Encounter (Signed)
Faxed medical records to [facility] at [phone], Release ID: [id number]

## 2018-03-06 ENCOUNTER — Ambulatory Visit (HOSPITAL_COMMUNITY)
Admission: RE | Admit: 2018-03-06 | Discharge: 2018-03-06 | Disposition: A | Discharge status: Home / Self Care | Payer: Medicare HMO | Source: Ambulatory Visit | Attending: Oncology | Admitting: Oncology

## 2018-03-06 DIAGNOSIS — Z170 Estrogen receptor positive status [ER+]: Secondary | ICD-10-CM | POA: Diagnosis present

## 2018-03-06 DIAGNOSIS — C50812 Malignant neoplasm of overlapping sites of left female breast: Secondary | ICD-10-CM | POA: Diagnosis not present

## 2018-03-06 DIAGNOSIS — D0511 Intraductal carcinoma in situ of right breast: Secondary | ICD-10-CM

## 2018-03-06 DIAGNOSIS — D0512 Intraductal carcinoma in situ of left breast: Secondary | ICD-10-CM | POA: Diagnosis not present

## 2018-03-06 DIAGNOSIS — Z9889 Other specified postprocedural states: Secondary | ICD-10-CM | POA: Insufficient documentation

## 2018-03-06 DIAGNOSIS — Z9189 Other specified personal risk factors, not elsewhere classified: Secondary | ICD-10-CM | POA: Insufficient documentation

## 2018-03-06 MED ORDER — GADOBENATE DIMEGLUMINE 529 MG/ML IV SOLN
18.0000 mL | Freq: Once | INTRAVENOUS | Status: AC | PRN
  Administered 2018-03-06: 18 mL via INTRAVENOUS

## 2018-03-27 NOTE — Progress Notes (Addendum)
[facility] at [facility] [address] [place], [place] [address] [phone]  Date:  03/28/2018   Name:  Laura Bush   DOB:  Mar 22, 1951   MRN:  [id number]  PCP:  Darreld Mclean, MD    Chief Complaint: Hypertension (6 month follow up); Hyperlipidemia; Trouble Swallowing; and Headache (low grade contant headache, gets better with antihistamine)   History of Present Illness:  Laura Bush is a 67 y.o. very pleasant female patient who presents with the following:  Here today for a follow-up visit History of breast cancer, HTN, hyperlipidemia Dx with breast cancer in [place] in 2016 Her oncologist is Dr. Jana Hakim I last saw her in April of this year for a sinus infection Could use a lipid panel and A1c today   From last oncology appt in December or 2018:  [name] is now 2-1/2 years out from definitive surgery for her bilateral breast cancers, with no evidence of disease activity. This is very favorable. She continues on tamoxifen, with excellent tolerance. The plan is to continue that for a total of 5 years. On the right side, she found incidentally noted ductal carcinoma in situ at the time of reconstruction. Accordingly we do not have assured margins and she did not receive postoperative radiation. For that reason we are continuing breast MRI on a yearly basis. She will have her next breast MRI next June. Of course she has routine mammography in January of the year Accordingly I am going to see her again in late January 2020. She knows to call for any other issues that may develop before that visit.  Recent vacation to [place] which was great She has a new grand-son in [place]-  Her daughter is in [place] and has 2 kids Her son is local and hs twins   She is now having annual breast  MRI and mammo  Needs a pneumovax 23 today- will give She does have what feels like an esophageal spasm if she eats too fast - has noted this for  9 - 12 months She is not sure if food is getting stuck If she just rests it seems to go away She may notice this a couple of times a week.  It is not getting worse She cannot determine if any particular foods are doing this It is not a breathing issue  This happens next to never if she eats slowly Offered a GI visit but she declines for now  Also for the last month she has noted a mild headache. Pretty much daily The HA is generally frontal and can be in the back of her head She cannot determine any pattern However if she does take an allegra first thing it won't occur Other days she might  No nausea or vomiting No neuro sx  No phono or photophobia She did have cluster HA in her 40s but this is not the same  The HA is not severe- it will resolve with ibuprofen which she may take twice a week She does have some PND, and occasional sneezing Patient Active Problem List   Diagnosis Date Noted  . Malignant 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, estrogen receptor positive ([facility]) 05/29/2017  . 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 Vitamin D deficiency 01/29/2016  . Preventative health care 01/29/2016  . Hot flashes due to tamoxifen 10/05/2015  . Diarrhea 05/27/2015  . Eczema 12/29/2014  .  Venous insufficiency 10/16/2014  . Essential hypertension 10/01/2014  . Hypercholesteremia 10/01/2014  . GERD (gastroesophageal reflux disease) 10/01/2014    Past Medical History:  Diagnosis Date  . Asthma    in cold weather  . Breast cancer ([facility]) 09/18/14   left breast  . Breast cancer ([facility]) 10/07/14   bx left breast  . Breast cancer of upper-outer quadrant of left female breast ([facility]) 09/22/2014  . Cluster headaches    in her 40's  . GERD (gastroesophageal reflux disease)   . Heart murmur    as a child (has outgrown)  . High cholesterol   . Hypertension   . Microscopic colitis   . OSA on CPAP   . Personal history of radiation therapy   . Pneumonia  2000's X 1  . PONV (postoperative nausea and vomiting)   . Rosacea   . S/P radiation therapy 12/30/14-01/27/15   left breast 50Gy total dose  . 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 Venous insufficiency     Past Surgical History:  Procedure Laterality Date  . ABDOMINAL HYSTERECTOMY  1999  . APPENDECTOMY  ~ 2006  . BREAST BIOPSY Left 10/2014; 10/2014  . BREAST LUMPECTOMY    . BREAST REDUCTION SURGERY Bilateral 11/11/2014   Procedure: Bilateral Breast Reduction;  Surgeon: Crissie Reese, MD;  Location: [facility];  Service: Plastics;  Laterality: Bilateral;  . CARPAL TUNNEL RELEASE Bilateral    2 surgeries on right, 1 on left  . CESAREAN SECTION  1978; 1980  . COLONOSCOPY    . FOREARM FRACTURE SURGERY Right 2003 X 3  . FRACTURE SURGERY    . KNEE ARTHROSCOPY Bilateral    meniscus repair  . PARTIAL MASTECTOMY WITH AXILLARY SENTINEL LYMPH NODE BIOPSY Left 11/11/2014  . PARTIAL MASTECTOMY WITH NEEDLE LOCALIZATION AND AXILLARY SENTINEL LYMPH NODE BX Left 11/11/2014   Procedure: LEFT BREAST PARTIAL MASTECTOMY WITH NEEDLE LOCALIZATION TIMES TWO AND LEFT AXILLARY SENTINEL LYMPH NODE Biopsy;  Surgeon: Fanny Skates, MD;  Location: [facility];  Service: General;  Laterality: Left;  . REDUCTION MAMMAPLASTY Bilateral 11/11/2014  . SQUAMOUS CELL CARCINOMA EXCISION  1980's X 1   "nose"  . TONSILLECTOMY  ~ 1957  . TUBAL LIGATION  ?1984    Social History   Tobacco Use  . Smoking status: Never Smoker  . Smokeless tobacco: Never Used  Substance Use Topics  . Alcohol use: Yes    Alcohol/week: 4.0 standard drinks    Types: 4 Glasses of wine per week  . Drug use: No    Family History  Problem Relation Age of Onset  . Uterine cancer Sister 19  . Stroke Father        deceased  . Heart failure Father   . Dementia Mother        Lives in [place]  . Hypertension Mother   . Hyperlipidemia Mother   . Heart attack Maternal Grandmother   . Hypertension Maternal Grandfather   . Stroke Paternal Grandfather   .  Hypertension Son     Allergies  Allergen Reactions  . Amlodipine Swelling  . Lanolin   . Tape Other (See Comments)    Redness (Bandaids also)    Medication list has been reviewed and updated.  Current Outpatient Medications on File Prior to Visit  Medication Sig Dispense Refill  . aMILoride (MIDAMOR) 5 MG tablet Take 1 tablet (5 mg total) by mouth daily. 90 tablet 3  . B Complex-C (B-COMPLEX WITH VITAMIN C) tablet Take 1 tablet by  mouth daily.    . carvedilol (COREG) 12.5 MG tablet Take 12.5 mg by mouth 2 (two) times daily with a meal.    . Cholecalciferol (VITAMIN D3) 2000 UNITS TABS Take 1 tablet by mouth daily.    Marland Kitchen gabapentin (NEURONTIN) 300 MG capsule Take 1 capsule (300 mg total) by mouth at bedtime. 90 capsule 3  . hydrochlorothiazide (MICROZIDE) 12.5 MG capsule Take 1 capsule (12.5 mg total) by mouth daily. 90 capsule 3  . simvastatin (ZOCOR) 20 MG tablet Take 1 tablet (20 mg total) by mouth daily. 90 tablet 3  . tamoxifen (NOLVADEX) 20 MG tablet Take 1 tablet (20 mg total) by mouth daily. 90 tablet 3  . telmisartan (MICARDIS) 80 MG tablet Take 1 tablet (80 mg total) by mouth daily. 90 tablet 3  . UNABLE TO FIND Please fit patient for a compression sleeve.  Diagnosis:  Bilateral breast cancer 1 each 0   No current facility-administered medications on file prior to visit.     Review of Systems:  As per HPI- otherwise negative.   Physical Examination: Vitals:   03/28/18 1027  BP: 130/78  Pulse: 63  Resp: 16  SpO2: 96%   Vitals:   03/28/18 1027  Weight: 190 lb (86.2 kg)  Height: 4\' 11"  (1.499 m)   Body mass index is 38.38 kg/m. Ideal Body Weight: Weight in (lb) to have BMI = 25: 123.5 ,  GEN: WDWN, NAD, Non-toxic, A & O x 3, obese, looks well  HEENT: Atraumatic, Normocephalic. Neck supple. No masses, No LAD.  Bilateral TM wnl, oropharynx normal.  PEERL,EOMI.   Ears and Nose: No external deformity.   CV: RRR, No M/G/R. No JVD. No thrill. No extra heart  sounds. PULM: CTA B, no wheezes, crackles, rhonchi. No retractions. No resp. distress. No accessory muscle use. EXTR: No c/c/e NEURO Normal gait.  Normal strength, dtr and sensation of all extremities  PSYCH: Normally interactive. Conversant. Not depressed or anxious appearing.  Calm demeanor.    Assessment and Plan: Essential hypertension - Plan: carvedilol (COREG) 12.5 MG tablet, hydrochlorothiazide (MICROZIDE) 12.5 MG capsule, aMILoride (MIDAMOR) 5 MG tablet, telmisartan (MICARDIS) 80 MG tablet  Hypercholesteremia - Plan: Lipid panel  Elevated glucose - Plan: Hemoglobin A1c  Immunization due - Plan: Pneumococcal polysaccharide vaccine 23-valent greater than or equal to 2yo subcutaneous/IM  Ductal carcinoma in situ (DCIS) of right breast - Plan: tamoxifen (NOLVADEX) 20 MG tablet  Mixed hyperlipidemia - Plan: simvastatin (ZOCOR) 20 MG tablet  Hot flashes - Plan: gabapentin (NEURONTIN) 300 MG capsule  Medication monitoring encounter - Plan: Basic metabolic panel  Need for Streptococcus pneumoniae vaccination  Frequent headaches  Esophageal spasm  BP is well controled on current regimen  Did refills for her today pnemovax booster roday Discussed plans for her swallowing issue and HA-  Please let me know if your swallowing/ esophageal issue does not go away with eating more slowly. I can certainly have you see GI Pleas try taking your Allegra daily for a couple of weeks.  If this does not resolve your headaches contact me and I will set up a scan for you Signed Lamar Blinks, MD  Received her labs, message to pt  Results for orders placed or performed in visit on 03/28/18  Lipid panel  Result Value Ref Range   Cholesterol 174 0 - 200 mg/dL   Triglycerides 176.0 (H) 0.0 - 149.0 mg/dL   HDL 55.90 >39.00 mg/dL   VLDL 35.2 0.0 - 40.0 mg/dL  LDL Cholesterol 83 0 - 99 mg/dL   Total CHOL/HDL Ratio 3    NonHDL 117.87   Hemoglobin A1c  Result Value Ref Range   Hgb A1c MFr  Bld 5.6 4.6 - 6.5 %  Basic metabolic panel  Result Value Ref Range   Sodium 141 135 - 145 mEq/L   Potassium 4.7 3.5 - 5.1 mEq/L   Chloride 105 96 - 112 mEq/L   CO2 29 19 - 32 mEq/L   Glucose, Bld 96 70 - 99 mg/dL   BUN 17 6 - 23 mg/dL   Creatinine, Ser 0.91 0.40 - 1.20 mg/dL   Calcium 9.9 8.4 - 10.5 mg/dL   GFR 65.53 >60.00 mL/min

## 2018-03-28 ENCOUNTER — Encounter: Payer: Self-pay | Admitting: Family Medicine

## 2018-03-28 ENCOUNTER — Ambulatory Visit (INDEPENDENT_AMBULATORY_CARE_PROVIDER_SITE_OTHER): Payer: Medicare HMO | Admitting: Family Medicine

## 2018-03-28 VITALS — BP 130/78 | HR 63 | Resp 16 | Ht 59.0 in | Wt 190.0 lb

## 2018-03-28 DIAGNOSIS — I10 Essential (primary) hypertension: Secondary | ICD-10-CM

## 2018-03-28 DIAGNOSIS — K224 Dyskinesia of esophagus: Secondary | ICD-10-CM

## 2018-03-28 DIAGNOSIS — R232 Flushing: Secondary | ICD-10-CM

## 2018-03-28 DIAGNOSIS — Z5181 Encounter for therapeutic drug level monitoring: Secondary | ICD-10-CM

## 2018-03-28 DIAGNOSIS — E782 Mixed hyperlipidemia: Secondary | ICD-10-CM

## 2018-03-28 DIAGNOSIS — R7309 Other abnormal glucose: Secondary | ICD-10-CM

## 2018-03-28 DIAGNOSIS — R51 Headache: Secondary | ICD-10-CM

## 2018-03-28 DIAGNOSIS — E7800 Pure hypercholesterolemia, unspecified: Secondary | ICD-10-CM | POA: Diagnosis not present

## 2018-03-28 DIAGNOSIS — Z23 Encounter for immunization: Secondary | ICD-10-CM | POA: Diagnosis not present

## 2018-03-28 DIAGNOSIS — D0511 Intraductal carcinoma in situ of right breast: Secondary | ICD-10-CM

## 2018-03-28 DIAGNOSIS — R519 Headache, unspecified: Secondary | ICD-10-CM

## 2018-03-28 LAB — BASIC METABOLIC PANEL
BUN: 17 mg/dL (ref 6–23)
CALCIUM: 9.9 mg/dL (ref 8.4–10.5)
CO2: 29 meq/L (ref 19–32)
CREATININE: 0.91 mg/dL (ref 0.40–1.20)
Chloride: 105 mEq/L (ref 96–112)
GFR: 65.53 mL/min (ref >60.00)
GLUCOSE: 96 mg/dL (ref 70–99)
Potassium: 4.7 mEq/L (ref 3.5–5.1)
Sodium: 141 mEq/L (ref 135–145)

## 2018-03-28 LAB — LIPID PANEL
CHOLESTEROL: 174 mg/dL (ref 0–200)
HDL: 55.9 mg/dL (ref >39.00)
LDL CALC: 83 mg/dL (ref 0–99)
NonHDL: 117.87
TRIGLYCERIDES: 176 mg/dL — AB (ref 0.0–149.0)
Total CHOL/HDL Ratio: 3
VLDL: 35.2 mg/dL (ref 0.0–40.0)

## 2018-03-28 LAB — HEMOGLOBIN A1C: Hgb A1c MFr Bld: 5.6 % (ref 4.6–6.5)

## 2018-03-28 MED ORDER — TELMISARTAN 80 MG PO TABS: 80.0000 mg | ORAL_TABLET | Freq: Every day | ORAL | 3 refills | Status: DC

## 2018-03-28 MED ORDER — TAMOXIFEN CITRATE 20 MG PO TABS: 20.0000 mg | ORAL_TABLET | Freq: Every day | ORAL | 3 refills | Status: DC

## 2018-03-28 MED ORDER — CARVEDILOL 12.5 MG PO TABS: 12.5000 mg | ORAL_TABLET | Freq: Two times a day (BID) | ORAL | 3 refills | Status: DC

## 2018-03-28 MED ORDER — GABAPENTIN 300 MG PO CAPS: 300.0000 mg | ORAL_CAPSULE | Freq: Every day | ORAL | 3 refills | Status: DC

## 2018-03-28 MED ORDER — AMILORIDE HCL 5 MG PO TABS: 5.0000 mg | ORAL_TABLET | Freq: Every day | ORAL | 3 refills | Status: DC

## 2018-03-28 MED ORDER — SIMVASTATIN 20 MG PO TABS: 20.0000 mg | ORAL_TABLET | Freq: Every day | ORAL | 3 refills | Status: DC

## 2018-03-28 MED ORDER — HYDROCHLOROTHIAZIDE 12.5 MG PO CAPS: 12.5000 mg | ORAL_CAPSULE | Freq: Every day | ORAL | 3 refills | Status: DC

## 2018-03-28 NOTE — Patient Instructions (Signed)
It was good to see you today!  Congrats on your new grand-son! Please let me know if your swallowing/ esophageal issue does not go away with eating more slowly. I can certainly have you see GI Pleas try taking your Allegra daily for a couple of weeks.  If this does not resolve your headaches contact me and I will set up a scan for you

## 2018-04-03 ENCOUNTER — Encounter: Payer: Self-pay | Admitting: Family Medicine

## 2018-04-12 DIAGNOSIS — R69 Illness, unspecified: Secondary | ICD-10-CM | POA: Diagnosis not present

## 2018-04-14 ENCOUNTER — Encounter: Payer: Self-pay | Admitting: Family Medicine

## 2018-04-19 ENCOUNTER — Encounter: Payer: Self-pay | Admitting: Family Medicine

## 2018-04-27 DIAGNOSIS — K219 Gastro-esophageal reflux disease without esophagitis: Secondary | ICD-10-CM | POA: Diagnosis not present

## 2018-04-27 DIAGNOSIS — I10 Essential (primary) hypertension: Secondary | ICD-10-CM | POA: Diagnosis not present

## 2018-04-27 DIAGNOSIS — E785 Hyperlipidemia, unspecified: Secondary | ICD-10-CM | POA: Diagnosis not present

## 2018-04-27 DIAGNOSIS — M1990 Unspecified osteoarthritis, unspecified site: Secondary | ICD-10-CM | POA: Diagnosis not present

## 2018-04-27 DIAGNOSIS — N951 Menopausal and female climacteric states: Secondary | ICD-10-CM | POA: Diagnosis not present

## 2018-04-27 DIAGNOSIS — J302 Other seasonal allergic rhinitis: Secondary | ICD-10-CM | POA: Diagnosis not present

## 2018-04-27 DIAGNOSIS — R32 Unspecified urinary incontinence: Secondary | ICD-10-CM | POA: Diagnosis not present

## 2018-04-27 DIAGNOSIS — R609 Edema, unspecified: Secondary | ICD-10-CM | POA: Diagnosis not present

## 2018-04-27 DIAGNOSIS — C50919 Malignant neoplasm of unspecified site of unspecified female breast: Secondary | ICD-10-CM | POA: Diagnosis not present

## 2018-04-27 DIAGNOSIS — G4700 Insomnia, unspecified: Secondary | ICD-10-CM | POA: Diagnosis not present

## 2018-06-13 NOTE — Progress Notes (Deleted)
Subjective:   Laura Bush is a 67 y.o. female who presents for an Initial Medicare Annual Wellness Visit.  Review of Systems   No ROS.  Medicare Wellness Visit. Additional risk factors are reflected in the social history.   Sleep patterns:  Home Safety/Smoke Alarms: Feels safe in home. Smoke alarms in place.    Female:   Pap- normal on 09/27/17    Mammo-   utd    Dexa scan-        CCS- Cologuard neg on 04/05/17     Objective:    There were no vitals filed for this visit. There is no height or weight on file to calculate BMI.  Advanced Directives 05/30/2016 01/25/2016 04/29/2015 03/02/2015 12/15/2014 11/11/2014 11/05/2014  Does Patient Have a Medical Advance Directive? No No No Yes Yes Yes Yes  Type of Advance Directive - - Programmer, multimedia of [place];Living will [place];Living will [place];Living will  Does patient want to make changes to medical advance directive? - - - No - Patient declined No - Patient declined No - Patient declined No - Patient declined  Copy of [place] in Chart? - - - - - Yes No - copy requested  Would patient like information on creating a medical advance directive? - - - - - - -    Current Medications (verified) Outpatient Encounter Medications as of 06/14/2018  Medication Sig  . aMILoride (MIDAMOR) 5 MG tablet Take 1 tablet (5 mg total) by mouth daily.  . B Complex-C (B-COMPLEX WITH VITAMIN C) tablet Take 1 tablet by mouth daily.  . carvedilol (COREG) 12.5 MG tablet Take 1 tablet (12.5 mg total) by mouth 2 (two) times daily with a meal.  . Cholecalciferol (VITAMIN D3) 2000 UNITS TABS Take 1 tablet by mouth daily.  Marland Kitchen gabapentin (NEURONTIN) 300 MG capsule Take 1 capsule (300 mg total) by mouth at bedtime.  . hydrochlorothiazide (MICROZIDE) 12.5 MG capsule Take 1 capsule (12.5 mg total) by mouth daily.  . simvastatin (ZOCOR) 20 MG tablet Take 1 tablet (20 mg total)  by mouth daily.  . tamoxifen (NOLVADEX) 20 MG tablet Take 1 tablet (20 mg total) by mouth daily.  Marland Kitchen telmisartan (MICARDIS) 80 MG tablet Take 1 tablet (80 mg total) by mouth daily.  Marland Kitchen UNABLE TO FIND Please fit patient for a compression sleeve.  Diagnosis:  Bilateral breast cancer   No facility-administered encounter medications on file as of 06/14/2018.     Allergies (verified) Amlodipine; Lanolin; and Tape   History: Past Medical History:  Diagnosis Date  . Asthma    in cold weather  . Breast cancer ([facility]) 09/18/14   left breast  . Breast cancer ([place]) 10/07/14   bx left breast  . Breast cancer of upper-outer quadrant of left female breast ([facility]) 09/22/2014  . Cluster headaches    in her 40's  . GERD (gastroesophageal reflux disease)   . Heart murmur    as a child (has outgrown)  . High cholesterol   . Hypertension   . Microscopic colitis   . OSA on CPAP   . Personal history of radiation therapy   . Pneumonia 2000's X 1  . PONV (postoperative nausea and vomiting)   . Rosacea   . S/P radiation therapy 12/30/14-01/27/15   left breast 50Gy total dose  . 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 Venous insufficiency    Past Surgical History:  Procedure Laterality Date  . ABDOMINAL HYSTERECTOMY  1999  . APPENDECTOMY  ~ 2006  . BREAST BIOPSY Left 10/2014; 10/2014  . BREAST LUMPECTOMY    . BREAST REDUCTION SURGERY Bilateral 11/11/2014   Procedure: Bilateral Breast Reduction;  Surgeon: Crissie Reese, MD;  Location: [facility];  Service: Plastics;  Laterality: Bilateral;  . CARPAL TUNNEL RELEASE Bilateral    2 surgeries on right, 1 on left  . CESAREAN SECTION  1978; 1980  . COLONOSCOPY    . FOREARM FRACTURE SURGERY Right 2003 X 3  . FRACTURE SURGERY    . KNEE ARTHROSCOPY Bilateral    meniscus repair  . PARTIAL MASTECTOMY WITH AXILLARY SENTINEL LYMPH NODE BIOPSY Left 11/11/2014  . PARTIAL MASTECTOMY WITH NEEDLE LOCALIZATION AND AXILLARY SENTINEL LYMPH NODE BX Left 11/11/2014   Procedure: LEFT  BREAST PARTIAL MASTECTOMY WITH NEEDLE LOCALIZATION TIMES TWO AND LEFT AXILLARY SENTINEL LYMPH NODE Biopsy;  Surgeon: Fanny Skates, MD;  Location: [facility];  Service: General;  Laterality: Left;  . REDUCTION MAMMAPLASTY Bilateral 11/11/2014  . SQUAMOUS CELL CARCINOMA EXCISION  1980's X 1   "nose"  . TONSILLECTOMY  ~ 1957  . TUBAL LIGATION  ?1984   Family History  Problem Relation Age of Onset  . Uterine cancer Sister 6  . Stroke Father        deceased  . Heart failure Father   . Dementia Mother        Lives in [place]  . Hypertension Mother   . Hyperlipidemia Mother   . Heart attack Maternal Grandmother   . Hypertension Maternal Grandfather   . Stroke Paternal Grandfather   . Hypertension Son    Social History   Socioeconomic History  . Marital status: Married    Spouse name: Not on file  . Number of children: Not on file  . Years of education: Not on file  . Highest education level: Not on file  Occupational History  . Not on file  Social Needs  . Financial resource strain: Not on file  . Food insecurity:    Worry: Not on file    Inability: Not on file  . Transportation needs:    Medical: Not on file    Non-medical: Not on file  Tobacco Use  . Smoking status: Never Smoker  . Smokeless tobacco: Never Used  Substance and Sexual Activity  . Alcohol use: Yes    Alcohol/week: 4.0 standard drinks    Types: 4 Glasses of wine per week  . Drug use: No  . Sexual activity: Not Currently  Lifestyle  . Physical activity:    Days per week: Not on file    Minutes per session: Not on file  . Stress: Not on file  Relationships  . Social connections:    Talks on phone: Not on file    Gets together: Not on file    Attends religious service: Not on file    Active member of club or organization: Not on file    Attends meetings of clubs or organizations: Not on file    Relationship status: Not on file  Other Topics Concern  . Not on file  Social History Narrative   From  [place].   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   2 children, boy and girl   Twin grandchildren on the way.   Quilting, sewing, reading.    Tobacco Counseling Counseling given: Not Answered   Clinical Intake:  Activities of Daily Living No flowsheet data found.   Immunizations and Health Maintenance Immunization History  Administered Date(s) Administered  . Hepatitis A 08/24/1995, 12/23/1996  . Hepatitis B 12/23/1996, 02/14/1997, 05/24/1997, 06/04/1998  . IPV 08/24/1995  . Influenza Split 04/06/2017  . Influenza, High Dose Seasonal PF 05/10/2016, 04/06/2017  . Influenza,inj,Quad PF,6+ Mos 04/30/2015  . Influenza-Unspecified 07/07/2005, 06/08/2006, 05/28/2007, 04/23/2008, 05/23/2011, 05/14/2012, 05/20/2013, 04/12/2018  . Meningococcal Conjugate 05/07/2008  . Meningococcal Mcv4o 05/07/2008  . Pneumococcal Conjugate-13 03/22/2017  . Pneumococcal Polysaccharide-23 06/08/2006, 03/28/2018  . Tdap 09/19/2011  . Typhoid Inactivated 05/07/2008  . Yellow Fever 05/08/2008  . Zoster 06/21/2013   There are no preventive care reminders to display for this patient.  Patient Care Team: Copland, Gay Filler, MD as PCP - General (Family Medicine) Skeet Latch, MD as Attending Physician (Cardiology)  Indicate any recent Medical Services you may have received from other than [facility] providers in the past year (date may be approximate).     Assessment:   This is a routine wellness examination for Mersadez. Physical assessment deferred to PCP.  Hearing/Vision screen No exam data present  Dietary issues and exercise activities discussed:   Diet (meal preparation, eat out, water intake, caffeinated beverages, dairy products, fruits and vegetables): {Desc; diets:16563} Breakfast: Lunch:  Dinner:      Goals   None    Depression Screen No flowsheet data found.  Fall Risk Fall Risk  03/02/2015 12/15/2014    Falls in the past year? No No    Cognitive Function:        Screening Tests Health Maintenance  Topic Date Due  . MAMMOGRAM  09/01/2019  . Fecal DNA (Cologuard)  04/05/2020  . TETANUS/TDAP  09/18/2021  . INFLUENZA VACCINE  Completed  . DEXA SCAN  Completed  . Hepatitis C Screening  Completed  . PNA vac Low Risk Adult  Completed     Plan:   ***  I have personally reviewed and noted the following in the patient's chart:   . Medical and social history . Use of alcohol, tobacco or illicit drugs  . Current medications and supplements . Functional ability and status . Nutritional status . Physical activity . Advanced directives . List of other physicians . Hospitalizations, surgeries, and ER visits in previous 12 months . Vitals . Screenings to include cognitive, depression, and falls . Referrals and appointments  In addition, I have reviewed and discussed with patient certain preventive protocols, quality metrics, and best practice recommendations. A written personalized care plan for preventive services as well as general preventive health recommendations were provided to patient.     Shela Nevin, [place]   06/13/2018

## 2018-06-14 ENCOUNTER — Ambulatory Visit: Payer: Medicare HMO | Admitting: *Deleted

## 2018-06-14 ENCOUNTER — Encounter: Payer: Self-pay | Admitting: Family Medicine

## 2018-06-14 ENCOUNTER — Ambulatory Visit (INDEPENDENT_AMBULATORY_CARE_PROVIDER_SITE_OTHER): Payer: Medicare HMO | Admitting: Family Medicine

## 2018-06-14 VITALS — BP 122/78 | HR 68 | Temp 98.8°F | Resp 18 | Ht 59.0 in | Wt 190.0 lb

## 2018-06-14 DIAGNOSIS — R05 Cough: Secondary | ICD-10-CM

## 2018-06-14 DIAGNOSIS — R6883 Chills (without fever): Secondary | ICD-10-CM | POA: Diagnosis not present

## 2018-06-14 DIAGNOSIS — R059 Cough, unspecified: Secondary | ICD-10-CM

## 2018-06-14 LAB — POC INFLUENZA A&B (BINAX/QUICKVUE)
INFLUENZA A, POC: NEGATIVE
INFLUENZA B, POC: NEGATIVE

## 2018-06-14 MED ORDER — OSELTAMIVIR PHOSPHATE 75 MG PO CAPS: 75.0000 mg | ORAL_CAPSULE | Freq: Two times a day (BID) | ORAL | 0 refills | Status: DC

## 2018-06-14 MED ORDER — HYDROCODONE-HOMATROPINE 5-1.5 MG/5ML PO SYRP
5.0000 mL | ORAL_SOLUTION | Freq: Three times a day (TID) | ORAL | 0 refills | Status: AC | PRN
Start: 2018-06-14 — End: 2018-06-19

## 2018-06-14 NOTE — Progress Notes (Signed)
[facility] [place], [place], [place] [address] [phone]  Date:  06/14/2018   Name:  Laura Bush   DOB:  1951-03-31   MRN:  [id number]  PCP:  Darreld Mclean, MD    Chief Complaint: Flu like Symptoms (1 week, traveled to take care of family member, productive cough, chills, body aches, no known fever)   History of Present Illness:  Laura Bush is a 67 y.o. very pleasant female patient who presents with the following:  Here today with an acute illness She had come in for wellness visit with Laura Bush but was noted to be sick, so we put her on my schedule  Sx started 2-3 days ago (She was out of town and taking care of her 2yo grand-daughter last week, she was also ill with similar sx) She has a cough, chills, body aches Some ST with cough She is coughing up some material Ears feel sore No vomtiing or diarrhea No rash noted OTC she has used delsym, oral nasal decongestant, Ibuprofen  She has not measured a fever but she felt chiled and achy last night   Did flu shot this fall  Cough is keeping her awake at night   Patient Active Problem List   Diagnosis Date Noted  . Malignant 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, estrogen receptor positive ([facility]) 05/29/2017  . 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 Vitamin D deficiency 01/29/2016  . Preventative health care 01/29/2016  . Hot flashes due to tamoxifen 10/05/2015  . Diarrhea 05/27/2015  . Eczema 12/29/2014  . Venous insufficiency 10/16/2014  . Essential hypertension 10/01/2014  . Hypercholesteremia 10/01/2014  . GERD (gastroesophageal reflux disease) 10/01/2014    Past Medical History:  Diagnosis Date  . Asthma    in cold weather  . Breast cancer ([facility]) 09/18/14   left breast  . Breast cancer ([place]) 10/07/14   bx left breast  . Breast cancer of upper-outer quadrant of left female breast ([facility]) 09/22/2014   . Cluster headaches    in her 40's  . GERD (gastroesophageal reflux disease)   . Heart murmur    as a child (has outgrown)  . High cholesterol   . Hypertension   . Microscopic colitis   . OSA on CPAP   . Personal history of radiation therapy   . Pneumonia 2000's X 1  . PONV (postoperative nausea and vomiting)   . Rosacea   . S/P radiation therapy 12/30/14-01/27/15   left breast 50Gy total dose  . 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 Venous insufficiency     Past Surgical History:  Procedure Laterality Date  . ABDOMINAL HYSTERECTOMY  1999  . APPENDECTOMY  ~ 2006  . BREAST BIOPSY Left 10/2014; 10/2014  . BREAST LUMPECTOMY    . BREAST REDUCTION SURGERY Bilateral 11/11/2014   Procedure: Bilateral Breast Reduction;  Surgeon: Crissie Reese, MD;  Location: [facility];  Service: Plastics;  Laterality: Bilateral;  . CARPAL TUNNEL RELEASE Bilateral    2 surgeries on right, 1 on left  . CESAREAN SECTION  1978; 1980  . COLONOSCOPY    . FOREARM FRACTURE SURGERY Right 2003 X 3  . FRACTURE SURGERY    . KNEE ARTHROSCOPY Bilateral    meniscus repair  . PARTIAL MASTECTOMY WITH AXILLARY SENTINEL LYMPH NODE BIOPSY Left 11/11/2014  . PARTIAL MASTECTOMY WITH NEEDLE LOCALIZATION AND AXILLARY SENTINEL  LYMPH NODE BX Left 11/11/2014   Procedure: LEFT BREAST PARTIAL MASTECTOMY WITH NEEDLE LOCALIZATION TIMES TWO AND LEFT AXILLARY SENTINEL LYMPH NODE Biopsy;  Surgeon: Fanny Skates, MD;  Location: [facility];  Service: General;  Laterality: Left;  . REDUCTION MAMMAPLASTY Bilateral 11/11/2014  . SQUAMOUS CELL CARCINOMA EXCISION  1980's X 1   "nose"  . TONSILLECTOMY  ~ 1957  . TUBAL LIGATION  ?1984    Social History   Tobacco Use  . Smoking status: Never Smoker  . Smokeless tobacco: Never Used  Substance Use Topics  . Alcohol use: Yes    Alcohol/week: 4.0 standard drinks    Types: 4 Glasses of wine per week  . Drug use: No    Family History  Problem Relation Age of Onset  . Uterine cancer Sister 57  .  Stroke Father        deceased  . Heart failure Father   . Dementia Mother        Lives in [place]  . Hypertension Mother   . Hyperlipidemia Mother   . Heart attack Maternal Grandmother   . Hypertension Maternal Grandfather   . Stroke Paternal Grandfather   . Hypertension Son     Allergies  Allergen Reactions  . Amlodipine Swelling  . Lanolin   . Tape Other (See Comments)    Redness (Bandaids also)    Medication list has been reviewed and updated.  Current Outpatient Medications on File Prior to Visit  Medication Sig Dispense Refill  . aMILoride (MIDAMOR) 5 MG tablet Take 1 tablet (5 mg total) by mouth daily. 90 tablet 3  . B Complex-C (B-COMPLEX WITH VITAMIN C) tablet Take 1 tablet by mouth daily.    . carvedilol (COREG) 12.5 MG tablet Take 1 tablet (12.5 mg total) by mouth 2 (two) times daily with a meal. 180 tablet 3  . Cholecalciferol (VITAMIN D3) 2000 UNITS TABS Take 1 tablet by mouth daily.    Marland Kitchen FLUAD 0.5 ML SUSY ADM 0.5ML IM UTD  0  . gabapentin (NEURONTIN) 300 MG capsule Take 1 capsule (300 mg total) by mouth at bedtime. 90 capsule 3  . hydrochlorothiazide (MICROZIDE) 12.5 MG capsule Take 1 capsule (12.5 mg total) by mouth daily. 90 capsule 3  . simvastatin (ZOCOR) 20 MG tablet Take 1 tablet (20 mg total) by mouth daily. 90 tablet 3  . tamoxifen (NOLVADEX) 20 MG tablet Take 1 tablet (20 mg total) by mouth daily. 90 tablet 3  . telmisartan (MICARDIS) 80 MG tablet Take 1 tablet (80 mg total) by mouth daily. 90 tablet 3  . UNABLE TO FIND Please fit patient for a compression sleeve.  Diagnosis:  Bilateral breast cancer 1 each 0   No current facility-administered medications on file prior to visit.     Review of Systems:  As per HPI- otherwise negative. No SOB   Physical Examination: Vitals:   06/14/18 1137  BP: 122/78  Pulse: 68  Resp: 18  Temp: 98.8 F (37.1 C)  SpO2: 97%   Vitals:   06/14/18 1137  Weight: 190 lb (86.2 kg)  Height: 4\' 11"  (1.499 m)    Body mass index is 38.38 kg/m. Ideal Body Weight: Weight in (lb) to have BMI = 25: 123.5  GEN: WDWN, NAD, Non-toxic, A & O x 3, coughing some in room, appears to have URI  HEENT: Atraumatic, Normocephalic. Neck supple. No masses, No LAD. Bilateral TM wnl, oropharynx normal.  PEERL,EOMI.   Ears and Nose: No external deformity. CV:  RRR, No M/G/R. No JVD. No thrill. No extra heart sounds. PULM: CTA B, no wheezes, crackles, rhonchi. No retractions. No resp. distress. No accessory muscle use.  Lungs are clear  EXTR: No c/c/e NEURO Normal gait.  PSYCH: Normally interactive. Conversant. Not depressed or anxious appearing.  Calm demeanor.   Results for orders placed or performed in visit on 06/14/18  POC Influenza A&B (Binax test)  Result Value Ref Range   Influenza A, POC Negative Negative   Influenza B, POC Negative Negative    Assessment and Plan: Chills (without fever) - Plan: POC Influenza A&B (Binax test), oseltamivir (TAMIFLU) 75 MG capsule  Cough - Plan: HYDROcodone-homatropine (HYCODAN) 5-1.5 MG/5ML syrup  Here today with chills Rapid flu negative- however she is interested in taking tamiflu in case false negative which is reasonable Also rx for hycodan to use as needed for cough- cautioned regarding sedation  She will seek care if not feeling better in the next couple of days- Sooner if worse.    Meds ordered this encounter  Medications  . oseltamivir (TAMIFLU) 75 MG capsule    Sig: Take 1 capsule (75 mg total) by mouth 2 (two) times daily.    Dispense:  10 capsule    Refill:  0  . HYDROcodone-homatropine (HYCODAN) 5-1.5 MG/5ML syrup    Sig: Take 5 mLs by mouth every 8 (eight) hours as needed for up to 5 days for cough.    Dispense:  90 mL    Refill:  0     Signed Lamar Blinks, MD

## 2018-06-14 NOTE — Patient Instructions (Signed)
It was good to see you today- I hope that you feel better in the next few days  Use the tamiflu twice a day for 5 days for possible flu Hycodan syrup as needed for cough- this will make you drowsy so do not use when you need to drive  Let me know if you are not improving in a few days- sooner if you are getting worse!

## 2018-06-15 NOTE — Progress Notes (Signed)
Pt arrives feeling sick. AWV canceled. PCP appt scheduled same day.

## 2018-07-03 DIAGNOSIS — D1801 Hemangioma of skin and subcutaneous tissue: Secondary | ICD-10-CM | POA: Diagnosis not present

## 2018-07-03 DIAGNOSIS — L814 Other melanin hyperpigmentation: Secondary | ICD-10-CM | POA: Diagnosis not present

## 2018-07-03 DIAGNOSIS — L570 Actinic keratosis: Secondary | ICD-10-CM | POA: Diagnosis not present

## 2018-07-03 DIAGNOSIS — D225 Melanocytic nevi of trunk: Secondary | ICD-10-CM | POA: Diagnosis not present

## 2018-07-03 DIAGNOSIS — L738 Other specified follicular disorders: Secondary | ICD-10-CM | POA: Diagnosis not present

## 2018-07-03 DIAGNOSIS — L821 Other seborrheic keratosis: Secondary | ICD-10-CM | POA: Diagnosis not present

## 2018-07-03 DIAGNOSIS — Z85828 Personal history of other malignant neoplasm of skin: Secondary | ICD-10-CM | POA: Diagnosis not present

## 2018-07-03 DIAGNOSIS — L820 Inflamed seborrheic keratosis: Secondary | ICD-10-CM | POA: Diagnosis not present

## 2018-07-17 ENCOUNTER — Encounter: Payer: Self-pay | Admitting: Cardiovascular Disease

## 2018-07-17 ENCOUNTER — Ambulatory Visit: Payer: Medicare HMO | Admitting: Cardiovascular Disease

## 2018-07-17 VITALS — BP 132/70 | HR 62 | Ht 59.0 in | Wt 191.4 lb

## 2018-07-17 DIAGNOSIS — R0602 Shortness of breath: Secondary | ICD-10-CM

## 2018-07-17 DIAGNOSIS — I10 Essential (primary) hypertension: Secondary | ICD-10-CM | POA: Diagnosis not present

## 2018-07-17 DIAGNOSIS — E669 Obesity, unspecified: Secondary | ICD-10-CM | POA: Diagnosis not present

## 2018-07-17 DIAGNOSIS — E7800 Pure hypercholesterolemia, unspecified: Secondary | ICD-10-CM

## 2018-07-17 NOTE — Patient Instructions (Signed)
Medication Instructions:  Your physician recommends that you continue on your current medications as directed. Please refer to the Current Medication list given to you today.  If you need a refill on your cardiac medications before your next appointment, please call your pharmacy.   Lab work: NONE  Testing/Procedures: NONE   Follow-Up: At [facility], you and your health needs are our priority.  As part of our continuing mission to provide you with exceptional heart care, we have created designated Provider Care Teams.  These Care Teams include your primary Cardiologist (physician) and Advanced Practice Providers (APPs -  Physician Assistants and Nurse Practitioners) who all work together to provide you with the care you need, when you need it. You will need a follow up appointment in 12 months.  Please call our office 2 months in advance to schedule this appointment.  You may see DR Winfield  or one of the following Advanced Practice Providers on your designated Care Team:   Luke Kilroy, PA-C Krista Kroeger, PA-C . Callie Goodrich, PA-C    

## 2018-07-17 NOTE — Progress Notes (Signed)
Cardiology Office Note   Date:  07/17/2018   ID:  Laura Bush, DOB 22-Aug-1950, MRN [id number]  PCP:  Darreld Mclean, MD  Cardiologist:   Skeet Latch, MD  Nephrologist: Dr. Trinda Pascal [facility] office)  Chief Complaint  Patient presents with  . Follow-up    History of Present Illness: Laura Bush is a 67 y.o. female with breast cancer s/p lumpectomy and XRT, GERD, hypertension and hyperlipidemia who presents for follow up.  She was seen 04/2016 with a report of progressive exertional dyspnea. It was especially prominent when walking up an incline. She was referred for an exercise Myoview that was negative for ischemia and revealed LVEF 71%.  She also noted that her heart rate only gets into the 90s with exertion.  Metoprolol was switched to carvedilol. She followed up with our pharmacist in her blood pressure was well-controlled. However amlodipine was discontinued due to lower extremity edema.    Since her last appointment [name] has been doing well.  She notices swelling in her hands and feet after eating a salty meal.  For the last month she has been struggling with a URI.  She visited family in [place] and has been sick since that time.  She tested negative for flu but took Tamiflu.  Overall she is getting better but still has a cough and some postnasal drip.  She is doing yoga 2 days/week.  She has no chest pain or shortness of breath with exertion.  She does not get much cardio or exercise other than her yoga class.  She does have some lower extremity edema that gets worse throughout the day.  It is better since stopping amlodipine.  She denies orthopnea or PND.  Her blood pressure at home has been around 130/70.    Past Medical History:  Diagnosis Date  . Asthma    in cold weather  . Breast cancer ([place]) 09/18/14   left breast  . Breast cancer ([place]) 10/07/14   bx left breast  . Breast cancer of upper-outer quadrant of left female breast ([place])  09/22/2014  . Cluster headaches    in her 40's  . GERD (gastroesophageal reflux disease)   . Heart murmur    as a child (has outgrown)  . High cholesterol   . Hypertension   . Microscopic colitis   . OSA on CPAP   . Personal history of radiation therapy   . Pneumonia 2000's X 1  . PONV (postoperative nausea and vomiting)   . Rosacea   . S/P radiation therapy 12/30/14-01/27/15   left breast 50Gy total dose  . 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 Venous insufficiency     Past Surgical History:  Procedure Laterality Date  . ABDOMINAL HYSTERECTOMY  1999  . APPENDECTOMY  ~ 2006  . BREAST BIOPSY Left 10/2014; 10/2014  . BREAST LUMPECTOMY    . BREAST REDUCTION SURGERY Bilateral 11/11/2014   Procedure: Bilateral Breast Reduction;  Surgeon: Crissie Reese, MD;  Location: [place];  Service: Plastics;  Laterality: Bilateral;  . CARPAL TUNNEL RELEASE Bilateral    2 surgeries on right, 1 on left  . CESAREAN SECTION  1978; 1980  . COLONOSCOPY    . FOREARM FRACTURE SURGERY Right 2003 X 3  . FRACTURE SURGERY    . KNEE ARTHROSCOPY Bilateral    meniscus repair  . PARTIAL MASTECTOMY WITH AXILLARY SENTINEL LYMPH NODE BIOPSY Left 11/11/2014  . PARTIAL MASTECTOMY WITH NEEDLE LOCALIZATION AND AXILLARY  SENTINEL LYMPH NODE BX Left 11/11/2014   Procedure: LEFT BREAST PARTIAL MASTECTOMY WITH NEEDLE LOCALIZATION TIMES TWO AND LEFT AXILLARY SENTINEL LYMPH NODE Biopsy;  Surgeon: Fanny Skates, MD;  Location: [place];  Service: General;  Laterality: Left;  . REDUCTION MAMMAPLASTY Bilateral 11/11/2014  . SQUAMOUS CELL CARCINOMA EXCISION  1980's X 1   "nose"  . TONSILLECTOMY  ~ 1957  . TUBAL LIGATION  ?1984     Current Outpatient Medications  Medication Sig Dispense Refill  . aMILoride (MIDAMOR) 5 MG tablet Take 1 tablet (5 mg total) by mouth daily. 90 tablet 3  . B Complex-C (B-COMPLEX WITH VITAMIN C) tablet Take 1 tablet by mouth daily.    . carvedilol (COREG) 12.5 MG tablet Take 1 tablet (12.5 mg total) by  mouth 2 (two) times daily with a meal. 180 tablet 3  . Cholecalciferol (VITAMIN D3) 2000 UNITS TABS Take 1 tablet by mouth daily.    Marland Kitchen Fexofenadine HCl (ALLEGRA ALLERGY PO) Take by mouth daily.    Marland Kitchen FLUAD 0.5 ML SUSY ADM 0.5ML IM UTD  0  . gabapentin (NEURONTIN) 300 MG capsule Take 1 capsule (300 mg total) by mouth at bedtime. 90 capsule 3  . hydrochlorothiazide (MICROZIDE) 12.5 MG capsule Take 1 capsule (12.5 mg total) by mouth daily. 90 capsule 3  . oseltamivir (TAMIFLU) 75 MG capsule Take 1 capsule (75 mg total) by mouth 2 (two) times daily. 10 capsule 0  . simvastatin (ZOCOR) 20 MG tablet Take 1 tablet (20 mg total) by mouth daily. 90 tablet 3  . tamoxifen (NOLVADEX) 20 MG tablet Take 1 tablet (20 mg total) by mouth daily. 90 tablet 3  . telmisartan (MICARDIS) 80 MG tablet Take 1 tablet (80 mg total) by mouth daily. 90 tablet 3  . UNABLE TO FIND Please fit patient for a compression sleeve.  Diagnosis:  Bilateral breast cancer 1 each 0   No current facility-administered medications for this visit.     Allergies:   Amlodipine; Lanolin; and Tape    Social History:  The patient  reports that she has never smoked. She has never used smokeless tobacco. She reports that she drinks about 4.0 standard drinks of alcohol per week. She reports that she does not use drugs.   Family History:  The patient's family history includes Dementia in her mother; Heart attack in her maternal grandmother; Heart failure in her father; Hyperlipidemia in her mother; Hypertension in her maternal grandfather, mother, and son; Stroke in her father and paternal grandfather; Uterine cancer (age of onset: 63) in her sister.    ROS:  Please see the history of present illness.   Otherwise, review of systems are positive for cold hands and feet.   All other systems are reviewed and negative.    PHYSICAL EXAM: VS:  BP 132/70   Pulse 62   Ht 4\' 11"  (1.499 m)   Wt 191 lb 6.4 oz (86.8 kg)   BMI 38.66 kg/m  , BMI Body  mass index is 38.66 kg/m. GENERAL:  Well appearing HEENT: Pupils equal round and reactive, fundi not visualized, oral mucosa unremarkable NECK:  No jugular venous distention, waveform within normal limits, carotid upstroke brisk and symmetric, no bruits, no thyromegaly LYMPHATICS:  No cervical adenopathy LUNGS:  Clear to auscultation bilaterally HEART:  RRR.  PMI not displaced or sustained,S1 and S2 within normal limits, no S3, no S4, no clicks, no rubs, no murmurs ABD:  Flat, positive bowel sounds normal in frequency in pitch, no  bruits, no rebound, no guarding, no midline pulsatile mass, no hepatomegaly, no splenomegaly EXT:  2 plus pulses throughout, 1+ LE edema to the ankles, no cyanosis no clubbing SKIN:  No rashes no nodules NEURO:  Cranial nerves II through XII grossly intact, motor grossly intact throughout PSYCH:  Cognitively intact, oriented to person place and time   EKG:  EKG is ordered today. The ekg ordered 05/03/16 demonstrates sinus rhythm.  Rate 67 bpm.  L axis deviation.  Absent R wave progression 07/12/17: Sinus bradycardia.  Rate 56 bpm.  LAFB.  Incomplete right bundle branch block.  Poor R wave progression. 07/17/18: Sinus rhythm.  Rate 62 bpm.  LAFB.  Incomplete left bundle branch block.  Exercise Myoview 05/11/16:   The left ventricular ejection fraction is hyperdynamic (>65%).  Nuclear stress EF: 71%.  Blood pressure demonstrated a normal response to exercise.  There was no ST segment deviation noted during stress.  The study is normal.   Normal stress nuclear study with no ischemia or infarction; EF 71 with normal wall motion.    Recent Labs: 12/04/2017: Hemoglobin 12.6; Platelets 199.0; TSH 1.04 03/28/2018: BUN 17; Creatinine, Ser 0.91; Potassium 4.7; Sodium 141    Lipid Panel    Component Value Date/Time   CHOL 174 03/28/2018 1054   TRIG 176.0 (H) 03/28/2018 1054   HDL 55.90 03/28/2018 1054   CHOLHDL 3 03/28/2018 1054   VLDL 35.2 03/28/2018 1054    LDLCALC 83 03/28/2018 1054      Wt Readings from Last 3 Encounters:  07/17/18 191 lb 6.4 oz (86.8 kg)  06/14/18 190 lb (86.2 kg)  03/28/18 190 lb (86.2 kg)      ASSESSMENT AND PLAN:  # Exertional dyspnea:  Resolved.  Symptoms have improved with training and by switching metoprolol to carvedilol.  Recommended that she increase her cardio.   # Hypertension: Blood pressure was mildly elevated.  Goal <130/80.  Recommended that she increase her aerobic exercise.   # Hyperlipidemia: Continue simvastatin.  LDL 83 on 03/2018.   Current medicines are reviewed at length with the patient today.  The patient does not have concerns regarding medicines.  The following changes have been made:  no change  Labs/ tests ordered today include:   Orders Placed This Encounter  Procedures  . EKG 12-Lead     Disposition:   FU with Satia Winger C. Oval Linsey, MD, [facility] in 1 year.  She prefers to keep one year follow up for prevention.      Signed, Rahmel Nedved C. Oval Linsey, MD, [facility]  07/17/2018 8:46 AM     [facility]

## 2018-07-20 ENCOUNTER — Other Ambulatory Visit: Payer: Self-pay | Admitting: Oncology

## 2018-07-20 DIAGNOSIS — Z853 Personal history of malignant neoplasm of breast: Secondary | ICD-10-CM

## 2018-07-25 NOTE — Progress Notes (Signed)
Subjective:   Laura Bush is a 67 y.o. female who presents for an Initial Medicare Annual Wellness Visit.  Review of Systems   No ROS.  Medicare Wellness Visit. Additional risk factors are reflected in the social history. Cardiac Risk Factors include: advanced age (>69men, >37 women);hypertension;obesity (BMI >30kg/m2) Sleep patterns: 8 hrs usually. Takes Benadryl as needed. Wears CPAP.  Home Safety/Smoke Alarms: Feels safe in home. Smoke alarms in place. Lives with husband in 1 story home.     Female:   Pap- 09/27/17      Mammo- scheduled 09/03/18      Dexa scan-  ordered      [facility]- Cologuard 04/05/17- neg Eye- [facility] every 2 yrs     Objective:    Today's Vitals   07/26/18 1032  BP: 136/84  Pulse: 60  SpO2: 96%  Weight: 189 lb (85.7 kg)  Height: 4\' 11"  (1.499 m)   Body mass index is 38.17 kg/m.  Advanced Directives 07/26/2018 05/30/2016 01/25/2016 04/29/2015 03/02/2015 12/15/2014 11/11/2014  Does Patient Have a Medical Advance Directive? Yes No No No Yes Yes Yes  Type of Paramedic of [place];Living will - - - Special educational needs teacher of [place];Living will [place];Living will  Does patient want to make changes to medical advance directive? - - - - No - Patient declined No - Patient declined No - Patient declined  Copy of [place] in Chart? Yes - validated most recent copy scanned in chart (See row information) - - - - - Yes  Would patient like information on creating a medical advance directive? - - - - - - -    Current Medications (verified) Outpatient Encounter Medications as of 07/26/2018  Medication Sig  . aMILoride (MIDAMOR) 5 MG tablet Take 1 tablet (5 mg total) by mouth daily.  . B Complex-C (B-COMPLEX WITH VITAMIN C) tablet Take 1 tablet by mouth daily.  . carvedilol (COREG) 12.5 MG tablet Take 1 tablet (12.5 mg total) by mouth 2 (two) times daily with a meal.  . Cholecalciferol  (VITAMIN D3) 2000 UNITS TABS Take 1 tablet by mouth daily.  Marland Kitchen Fexofenadine HCl (ALLEGRA ALLERGY PO) Take by mouth daily.  Marland Kitchen gabapentin (NEURONTIN) 300 MG capsule Take 1 capsule (300 mg total) by mouth at bedtime.  . hydrochlorothiazide (MICROZIDE) 12.5 MG capsule Take 1 capsule (12.5 mg total) by mouth daily.  . simvastatin (ZOCOR) 20 MG tablet Take 1 tablet (20 mg total) by mouth daily.  . tamoxifen (NOLVADEX) 20 MG tablet Take 1 tablet (20 mg total) by mouth daily.  Marland Kitchen telmisartan (MICARDIS) 80 MG tablet Take 1 tablet (80 mg total) by mouth daily.  Marland Kitchen UNABLE TO FIND Please fit patient for a compression sleeve.  Diagnosis:  Bilateral breast cancer  . FLUAD 0.5 ML SUSY ADM 0.5ML IM UTD  . [DISCONTINUED] oseltamivir (TAMIFLU) 75 MG capsule Take 1 capsule (75 mg total) by mouth 2 (two) times daily.   No facility-administered encounter medications on file as of 07/26/2018.     Allergies (verified) Amlodipine; Lanolin; and Tape   History: Past Medical History:  Diagnosis Date  . Asthma    in cold weather  . Breast cancer ([place]) 09/18/14   left breast  . Breast cancer ([place]) 10/07/14   bx left breast  . Breast cancer of upper-outer quadrant of left female breast ([place]) 09/22/2014  . Cluster headaches    in her 40's  . GERD (gastroesophageal  reflux disease)   . Heart murmur    as a child (has outgrown)  . High cholesterol   . Hypertension   . Microscopic colitis   . OSA on CPAP   . Personal history of radiation therapy   . Pneumonia 2000's X 1  . PONV (postoperative nausea and vomiting)   . Rosacea   . S/P radiation therapy 12/30/14-01/27/15   left breast 50Gy total dose  . 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 Venous insufficiency    Past Surgical History:  Procedure Laterality Date  . ABDOMINAL HYSTERECTOMY  1999  . APPENDECTOMY  ~ 2006  . BREAST BIOPSY Left 10/2014; 10/2014  . BREAST LUMPECTOMY    . BREAST REDUCTION SURGERY Bilateral 11/11/2014   Procedure: Bilateral Breast  Reduction;  Surgeon: Crissie Reese, MD;  Location: [place];  Service: Plastics;  Laterality: Bilateral;  . CARPAL TUNNEL RELEASE Bilateral    2 surgeries on right, 1 on left  . CESAREAN SECTION  1978; 1980  . COLONOSCOPY    . FOREARM FRACTURE SURGERY Right 2003 X 3  . FRACTURE SURGERY    . KNEE ARTHROSCOPY Bilateral    meniscus repair  . PARTIAL MASTECTOMY WITH AXILLARY SENTINEL LYMPH NODE BIOPSY Left 11/11/2014  . PARTIAL MASTECTOMY WITH NEEDLE LOCALIZATION AND AXILLARY SENTINEL LYMPH NODE BX Left 11/11/2014   Procedure: LEFT BREAST PARTIAL MASTECTOMY WITH NEEDLE LOCALIZATION TIMES TWO AND LEFT AXILLARY SENTINEL LYMPH NODE Biopsy;  Surgeon: Fanny Skates, MD;  Location: [place];  Service: General;  Laterality: Left;  . REDUCTION MAMMAPLASTY Bilateral 11/11/2014  . SQUAMOUS CELL CARCINOMA EXCISION  1980's X 1   "nose"  . TONSILLECTOMY  ~ 1957  . TUBAL LIGATION  ?1984   Family History  Problem Relation Age of Onset  . Uterine cancer Sister 35  . Stroke Father        deceased  . Heart failure Father   . Dementia Mother        Lives in [place]  . Hypertension Mother   . Hyperlipidemia Mother   . Heart attack Maternal Grandmother   . Hypertension Maternal Grandfather   . Stroke Paternal Grandfather   . Hypertension Son    Social History   Socioeconomic History  . Marital status: Married    Spouse name: Not on file  . Number of children: Not on file  . Years of education: Not on file  . Highest education level: Not on file  Occupational History  . Not on file  Social Needs  . Financial resource strain: Not on file  . Food insecurity:    Worry: Not on file    Inability: Not on file  . Transportation needs:    Medical: Not on file    Non-medical: Not on file  Tobacco Use  . Smoking status: Never Smoker  . Smokeless tobacco: Never Used  Substance and Sexual Activity  . Alcohol use: Yes    Alcohol/week: 5.0 standard drinks    Types: 5 Glasses of wine per week  . Drug use: No    . Sexual activity: Not Currently  Lifestyle  . Physical activity:    Days per week: Not on file    Minutes per session: Not on file  . Stress: Not on file  Relationships  . Social connections:    Talks on phone: Not on file    Gets together: Not on file    Attends religious service: Not on file    Active member of  club or organization: Not on file    Attends meetings of clubs or organizations: Not on file    Relationship status: Not on file  Other Topics Concern  . Not on file  Social History Narrative   From [place].   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   2 children, boy and girl   Twin grandchildren on the way.   Quilting, sewing, reading.    Tobacco Counseling Counseling given: Not Answered   Clinical Intake: Pain : No/denies pain    Activities of Daily Living In your present state of health, do you have any difficulty performing the following activities: 07/26/2018  Hearing? N  Vision? N  Difficulty concentrating or making decisions? N  Comment games on computer. reads daily.  Walking or climbing stairs? N  Dressing or bathing? N  Doing errands, shopping? N  Preparing Food and eating ? N  Using the Toilet? N  In the past six months, have you accidently leaked urine? N  Do you have problems with loss of bowel control? N  Managing your Medications? N  Managing your Finances? N  Housekeeping or managing your Housekeeping? N  Some recent data might be hidden     Immunizations and Health Maintenance Immunization History  Administered Date(s) Administered  . Hepatitis A 08/24/1995, 12/23/1996  . Hepatitis B 12/23/1996, 02/14/1997, 05/24/1997, 06/04/1998  . IPV 08/24/1995  . Influenza Split 04/06/2017  . Influenza, High Dose Seasonal PF 05/10/2016, 04/06/2017  . Influenza,inj,Quad PF,6+ Mos 04/30/2015  . Influenza-Unspecified 07/07/2005, 06/08/2006, 05/28/2007, 04/23/2008, 05/23/2011, 05/14/2012, 05/20/2013,  04/12/2018  . Meningococcal Conjugate 05/07/2008  . Meningococcal Mcv4o 05/07/2008  . Pneumococcal Conjugate-13 03/22/2017  . Pneumococcal Polysaccharide-23 06/08/2006, 03/28/2018  . Tdap 09/19/2011  . Typhoid Inactivated 05/07/2008  . Yellow Fever 05/08/2008  . Zoster 06/21/2013   There are no preventive care reminders to display for this patient.  Patient Care Team: Copland, Gay Filler, MD as PCP - General (Family Medicine) Skeet Latch, MD as Attending Physician (Cardiology)  Indicate any recent Medical Services you may have received from other than [facility] providers in the past year (date may be approximate).     Assessment:   This is a routine wellness examination for Na. Physical assessment deferred to PCP.  Hearing/Vision screen  Visual Acuity Screening   Right eye Left eye Both eyes  Without correction:     With correction: 20/25 20/25 20/25   Hearing Screening Comments: Able to hear conversational tones w/o difficulty. No issues reported.    Dietary issues and exercise activities discussed: Current Exercise Habits: Structured exercise class, Type of exercise: yoga, Time (Minutes): 60, Frequency (Times/Week): 2, Weekly Exercise (Minutes/Week): 120, Intensity: Mild, Exercise limited by: None identified Diet (meal preparation, eat out, water intake, caffeinated beverages, dairy products, fruits and vegetables): in general, a "healthy" diet  , well balanced, on average, 3 meals per day   Goals    . Increase physical activity      Depression Screen PHQ 2/9 Scores 07/26/2018  PHQ - 2 Score 0    Fall Risk Fall Risk  07/26/2018 03/02/2015 12/15/2014  Falls in the past year? 0 No No    Cognitive Function: Ad8 score reviewed for issues:  Issues making decisions:no  Less interest in hobbies / activities:no  Repeats questions, stories (family complaining):no  Trouble using ordinary gadgets (microwave, computer, phone):no  Forgets the month or year:  no  Mismanaging finances: no  Remembering appts:no  Daily problems with thinking and/or  memory:no Ad8 score is=0         Screening Tests Health Maintenance  Topic Date Due  . MAMMOGRAM  09/01/2019  . Fecal DNA (Cologuard)  04/05/2020  . TETANUS/TDAP  09/18/2021  . INFLUENZA VACCINE  Completed  . DEXA SCAN  Completed  . Hepatitis C Screening  Completed  . PNA vac Low Risk Adult  Completed     Plan:    Please schedule your next medicare wellness visit with me in 1 yr.  Continue to eat heart healthy diet (full of fruits, vegetables, whole grains, lean protein, water--limit salt, fat, and sugar intake) and increase physical activity as tolerated.  Schedule your bone density scan.  I have personally reviewed and noted the following in the patient's chart:   . Medical and social history . Use of alcohol, tobacco or illicit drugs  . Current medications and supplements . Functional ability and status . Nutritional status . Physical activity . Advanced directives . List of other physicians . Hospitalizations, surgeries, and ER visits in previous 12 months . Vitals . Screenings to include cognitive, depression, and falls . Referrals and appointments  In addition, I have reviewed and discussed with patient certain preventive protocols, quality metrics, and best practice recommendations. A written personalized care plan for preventive services as well as general preventive health recommendations were provided to patient.     Shela Nevin, [place]   07/26/2018

## 2018-07-26 ENCOUNTER — Encounter: Payer: Self-pay | Admitting: *Deleted

## 2018-07-26 ENCOUNTER — Ambulatory Visit (HOSPITAL_BASED_OUTPATIENT_CLINIC_OR_DEPARTMENT_OTHER)
Admission: RE | Admit: 2018-07-26 | Discharge: 2018-07-26 | Disposition: A | Discharge status: Home / Self Care | Payer: Medicare HMO | Source: Ambulatory Visit | Attending: Family Medicine | Admitting: Family Medicine

## 2018-07-26 ENCOUNTER — Ambulatory Visit (INDEPENDENT_AMBULATORY_CARE_PROVIDER_SITE_OTHER): Payer: Medicare HMO | Admitting: *Deleted

## 2018-07-26 VITALS — BP 136/84 | HR 60 | Ht 59.0 in | Wt 189.0 lb

## 2018-07-26 DIAGNOSIS — M85852 Other specified disorders of bone density and structure, left thigh: Secondary | ICD-10-CM | POA: Diagnosis not present

## 2018-07-26 DIAGNOSIS — Z0000 Encounter for general adult medical examination without abnormal findings: Secondary | ICD-10-CM

## 2018-07-26 DIAGNOSIS — Z780 Asymptomatic menopausal state: Secondary | ICD-10-CM | POA: Insufficient documentation

## 2018-07-26 NOTE — Progress Notes (Signed)
I have reviewed note by Ms. Vevelyn Royals and agree with her documentation  Denny Peon MD

## 2018-07-26 NOTE — Patient Instructions (Addendum)
Please schedule your next medicare wellness visit with me in 1 yr.  Continue to eat heart healthy diet (full of fruits, vegetables, whole grains, lean protein, water--limit salt, fat, and sugar intake) and increase physical activity as tolerated.  Schedule your bone density scan.   Laura Bush , Thank you for taking time to come for your Medicare Wellness Visit. I appreciate your ongoing commitment to your health goals. Please review the following plan we discussed and let me know if I can assist you in the future.   These are the goals we discussed: Goals    . Increase physical activity       This is a list of the screening recommended for you and due dates:  Health Maintenance  Topic Date Due  . Mammogram  09/01/2019  . Cologuard (Stool DNA test)  04/05/2020  . Tetanus Vaccine  09/18/2021  . Flu Shot  Completed  . DEXA scan (bone density measurement)  Completed  .  Hepatitis C: One time screening is recommended by Center for Disease Control  (CDC) for  adults born from [birth year] through 1965.   Completed  . Pneumonia vaccines  Completed    Health Maintenance for Postmenopausal Women Menopause is a normal process in which your reproductive ability comes to an end. This process happens gradually over a span of months to years, usually between the ages of 62 and 85. Menopause is complete when you have missed 12 consecutive menstrual periods. It is important to talk with your health care provider about some of the most common conditions that affect postmenopausal women, such as heart disease, cancer, and bone loss (osteoporosis). Adopting a healthy lifestyle and getting preventive care can help to promote your health and wellness. Those actions can also lower your chances of developing some of these common conditions. What should I know about menopause? During menopause, you may experience a number of symptoms, such as:  Moderate-to-severe hot flashes.  Night sweats.  Decrease in sex  drive.  Mood swings.  Headaches.  Tiredness.  Irritability.  Memory problems.  Insomnia. Choosing to treat or not to treat menopausal changes is an individual decision that you make with your health care provider. What should I know about hormone replacement therapy and supplements? Hormone therapy products are effective for treating symptoms that are associated with menopause, such as hot flashes and night sweats. Hormone replacement carries certain risks, especially as you become older. If you are thinking about using estrogen or estrogen with progestin treatments, discuss the benefits and risks with your health care provider. What should I know about heart disease and stroke? Heart disease, heart attack, and stroke become more likely as you age. This may be due, in part, to the hormonal changes that your body experiences during menopause. These can affect how your body processes dietary fats, triglycerides, and cholesterol. Heart attack and stroke are both medical emergencies. There are many things that you can do to help prevent heart disease and stroke:  Have your blood pressure checked at least every 1-2 years. High blood pressure causes heart disease and increases the risk of stroke.  If you are 21-70 years old, ask your health care provider if you should take aspirin to prevent a heart attack or a stroke.  Do not use any tobacco products, including cigarettes, chewing tobacco, or electronic cigarettes. If you need help quitting, ask your health care provider.  It is important to eat a healthy diet and maintain a healthy weight. ? Be sure  to include plenty of vegetables, fruits, low-fat dairy products, and lean protein. ? Avoid eating foods that are high in solid fats, added sugars, or salt (sodium).  Get regular exercise. This is one of the most important things that you can do for your health. ? Try to exercise for at least 150 minutes each week. The type of exercise that you  do should increase your heart rate and make you sweat. This is known as moderate-intensity exercise. ? Try to do strengthening exercises at least twice each week. Do these in addition to the moderate-intensity exercise.  Know your numbers.Ask your health care provider to check your cholesterol and your blood glucose. Continue to have your blood tested as directed by your health care provider.  What should I know about cancer screening? There are several types of cancer. Take the following steps to reduce your risk and to catch any cancer development as early as possible. Breast Cancer  Practice breast self-awareness. ? This means understanding how your breasts normally appear and feel. ? It also means doing regular breast self-exams. Let your health care provider know about any changes, no matter how small.  If you are 79 or older, have a clinician do a breast exam (clinical breast exam or CBE) every year. Depending on your age, family history, and medical history, it may be recommended that you also have a yearly breast X-ray (mammogram).  If you have a family history of breast cancer, talk with your health care provider about genetic screening.  If you are at high risk for breast cancer, talk with your health care provider about having an MRI and a mammogram every year.  Breast cancer (BRCA) gene test is recommended for women who have family members with BRCA-related cancers. Results of the assessment will determine the need for genetic counseling and BRCA1 and for BRCA2 testing. BRCA-related cancers include these types: ? Breast. This occurs in males or females. ? Ovarian. ? Tubal. This may also be called fallopian tube cancer. ? Cancer of the abdominal or pelvic lining (peritoneal cancer). ? Prostate. ? Pancreatic. Cervical, Uterine, and Ovarian Cancer Your health care provider may recommend that you be screened regularly for cancer of the pelvic organs. These include your ovaries,  uterus, and vagina. This screening involves a pelvic exam, which includes checking for microscopic changes to the surface of your cervix (Pap test).  For women ages 21-65, health care providers may recommend a pelvic exam and a Pap test every three years. For women ages 33-65, they may recommend the Pap test and pelvic exam, combined with testing for human papilloma virus (HPV), every five years. Some types of HPV increase your risk of cervical cancer. Testing for HPV may also be done on women of any age who have unclear Pap test results.  Other health care providers may not recommend any screening for nonpregnant women who are considered low risk for pelvic cancer and have no symptoms. Ask your health care provider if a screening pelvic exam is right for you.  If you have had past treatment for cervical cancer or a condition that could lead to cancer, you need Pap tests and screening for cancer for at least 20 years after your treatment. If Pap tests have been discontinued for you, your risk factors (such as having a new sexual partner) need to be reassessed to determine if you should start having screenings again. Some women have medical problems that increase the chance of getting cervical cancer. In these cases,  your health care provider may recommend that you have screening and Pap tests more often.  If you have a family history of uterine cancer or ovarian cancer, talk with your health care provider about genetic screening.  If you have vaginal bleeding after reaching menopause, tell your health care provider.  There are currently no reliable tests available to screen for ovarian cancer. Lung Cancer Lung cancer screening is recommended for adults 29-59 years old who are at high risk for lung cancer because of a history of smoking. A yearly low-dose CT scan of the lungs is recommended if you:  Currently smoke.  Have a history of at least 30 pack-years of smoking and you currently smoke or have  quit within the past 15 years. A pack-year is smoking an average of one pack of cigarettes per day for one year. Yearly screening should:  Continue until it has been 15 years since you quit.  Stop if you develop a health problem that would prevent you from having lung cancer treatment. Colorectal Cancer  This type of cancer can be detected and can often be prevented.  Routine colorectal cancer screening usually begins at age 59 and continues through age 63.  If you have risk factors for colon cancer, your health care provider may recommend that you be screened at an earlier age.  If you have a family history of colorectal cancer, talk with your health care provider about genetic screening.  Your health care provider may also recommend using home test kits to check for hidden blood in your stool.  A small camera at the end of a tube can be used to examine your colon directly (sigmoidoscopy or colonoscopy). This is done to check for the earliest forms of colorectal cancer.  Direct examination of the colon should be repeated every 5-10 years until age 31. However, if early forms of precancerous polyps or small growths are found or if you have a family history or genetic risk for colorectal cancer, you may need to be screened more often. Skin Cancer  Check your skin from head to toe regularly.  Monitor any moles. Be sure to tell your health care provider: ? About any new moles or changes in moles, especially if there is a change in a mole's shape or color. ? If you have a mole that is larger than the size of a pencil eraser.  If any of your family members has a history of skin cancer, especially at a young age, talk with your health care provider about genetic screening.  Always use sunscreen. Apply sunscreen liberally and repeatedly throughout the day.  Whenever you are outside, protect yourself by wearing long sleeves, pants, a wide-brimmed hat, and sunglasses. What should I know  about osteoporosis? Osteoporosis is a condition in which bone destruction happens more quickly than new bone creation. After menopause, you may be at an increased risk for osteoporosis. To help prevent osteoporosis or the bone fractures that can happen because of osteoporosis, the following is recommended:  If you are 16-67 years old, get at least 1,000 mg of calcium and at least 600 mg of vitamin D per day.  If you are older than age 34 but younger than age 38, get at least 1,200 mg of calcium and at least 600 mg of vitamin D per day.  If you are older than age 72, get at least 1,200 mg of calcium and at least 800 mg of vitamin D per day. Smoking and excessive alcohol intake  increase the risk of osteoporosis. Eat foods that are rich in calcium and vitamin D, and do weight-bearing exercises several times each week as directed by your health care provider. What should I know about how menopause affects my mental health? Depression may occur at any age, but it is more common as you become older. Common symptoms of depression include:  Low or sad mood.  Changes in sleep patterns.  Changes in appetite or eating patterns.  Feeling an overall lack of motivation or enjoyment of activities that you previously enjoyed.  Frequent crying spells. Talk with your health care provider if you think that you are experiencing depression. What should I know about immunizations? It is important that you get and maintain your immunizations. These include:  Tetanus, diphtheria, and pertussis (Tdap) booster vaccine.  Influenza every year before the flu season begins.  Pneumonia vaccine.  Shingles vaccine. Your health care provider may also recommend other immunizations. This information is not intended to replace advice given to you by your health care provider. Make sure you discuss any questions you have with your health care provider. Document Released: 09/16/2005 Document Revised: 02/12/2016 Document  Reviewed: 04/28/2015 Elsevier Interactive Patient Education  2019 [employer].

## 2018-07-27 ENCOUNTER — Encounter: Payer: Self-pay | Admitting: Family Medicine

## 2018-08-20 DIAGNOSIS — R69 Illness, unspecified: Secondary | ICD-10-CM | POA: Diagnosis not present

## 2018-08-21 DIAGNOSIS — L820 Inflamed seborrheic keratosis: Secondary | ICD-10-CM | POA: Diagnosis not present

## 2018-09-03 ENCOUNTER — Ambulatory Visit
Admission: RE | Admit: 2018-09-03 | Discharge: 2018-09-03 | Disposition: A | Discharge status: Home / Self Care | Payer: Medicare HMO | Source: Ambulatory Visit | Attending: Oncology | Admitting: Oncology

## 2018-09-03 DIAGNOSIS — Z853 Personal history of malignant neoplasm of breast: Secondary | ICD-10-CM

## 2018-09-03 DIAGNOSIS — R928 Other abnormal and inconclusive findings on diagnostic imaging of breast: Secondary | ICD-10-CM | POA: Diagnosis not present

## 2018-09-11 DIAGNOSIS — L820 Inflamed seborrheic keratosis: Secondary | ICD-10-CM | POA: Diagnosis not present

## 2018-09-14 ENCOUNTER — Telehealth: Payer: Self-pay | Admitting: Oncology

## 2018-09-14 NOTE — Telephone Encounter (Signed)
Patient called to schedule

## 2018-09-29 NOTE — Progress Notes (Addendum)
[facility] [address], [place], [place] [address] [phone]  Date:  10/03/2018   Name:  Laura Bush   DOB:  March 14, 1951   MRN:  [id number]  PCP:  Darreld Mclean, MD    Chief Complaint: Hypertension (6 month follow up)   History of Present Illness:  Laura Bush is a 68 y.o. very pleasant female patient who presents with the following:  Periodic follow-up visit today I last saw her in November for a sick visit  History of breast cancer, diagnosed 2016.  Also hypertension, hyperlipidemia, GERD, venous insufficiency  She was seen by her cardiologist, Dr. Oval Linsey, in December. She had a Myoview in 2017, which is negative for ischemia No CP or SOB   Her breast cancer, which was bilateral, was treated with definitive surgery.  She is taking tamoxifen for planned 5 years total She had follow-up with Dr. Jana Hakim yesterday -he recommends that she continue annual breast MRI in addition to her mammogram.  She is 4 years after her definitive surgery, and there is no evidence of disease recurrence-great news  Cologuard is up-to-date Mammogram done last month Immunizations are up-to-date, can suggest Shingrix She had a complete metabolic profile and CBC per Dr. Jana Hakim yesterday.  As such, no labs will be drawn today.  We will plan to do a lipid and A1c at our next visit  She has noted some PND- she uses allegra which helps.  She wonders if this is okay to use long-term, and I advised her that I see no concern  BP Readings from Last 3 Encounters:  10/03/18 120/78  10/02/18 116/77  07/26/18 136/84   She notes that "I have bad knees," she has had bilateral knee scopes in years past Last fall she tripped and fell, fell onto her right knee She still has some right knee pain since this fall  The knee hurts especially if she puts direct pressure on it, such as in some of her yoga poses. He does not feel unstable  per se, but admits that she is careful in how she walks.  She is taking daily calcium and vit D in response to osteopenia noted on her last DEXA scan  Patient Active Problem List   Diagnosis Date Noted  . Osteopenia 10/02/2018  . Malignant 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, estrogen receptor positive ([place]) 05/29/2017  . 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 Vitamin D deficiency 01/29/2016  . Preventative health care 01/29/2016  . Hot flashes due to tamoxifen 10/05/2015  . Diarrhea 05/27/2015  . Eczema 12/29/2014  . Venous insufficiency 10/16/2014  . Essential hypertension 10/01/2014  . Hypercholesteremia 10/01/2014  . GERD (gastroesophageal reflux disease) 10/01/2014    Past Medical History:  Diagnosis Date  . Asthma    in cold weather  . Breast cancer ([place]) 09/18/14   left breast  . Breast cancer ([place]) 10/07/14   bx left breast  . Breast cancer of upper-outer quadrant of left female breast ([place]) 09/22/2014  . Cluster headaches    in her 40's  . GERD (gastroesophageal reflux disease)   . Heart murmur    as a child (has outgrown)  . High cholesterol   . Hypertension   . Microscopic colitis   . OSA on CPAP   . Personal history of radiation therapy   . Pneumonia 2000's X 1  . PONV (postoperative  nausea and vomiting)   . Rosacea   . S/P radiation therapy 12/30/14-01/27/15   left breast 50Gy total dose  . 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 Venous insufficiency     Past Surgical History:  Procedure Laterality Date  . ABDOMINAL HYSTERECTOMY  1999  . APPENDECTOMY  ~ 2006  . BREAST BIOPSY Left 10/2014; 10/2014  . BREAST LUMPECTOMY Left 2016  . BREAST REDUCTION SURGERY Bilateral 11/11/2014   Procedure: Bilateral Breast Reduction;  Surgeon: Crissie Reese, MD;  Location: [facility];  Service: Plastics;  Laterality: Bilateral;  . CARPAL TUNNEL RELEASE Bilateral    2 surgeries on right, 1 on left  . CESAREAN SECTION  1978; 1980  .  COLONOSCOPY    . FOREARM FRACTURE SURGERY Right 2003 X 3  . FRACTURE SURGERY    . KNEE ARTHROSCOPY Bilateral    meniscus repair  . PARTIAL MASTECTOMY WITH AXILLARY SENTINEL LYMPH NODE BIOPSY Left 11/11/2014  . PARTIAL MASTECTOMY WITH NEEDLE LOCALIZATION AND AXILLARY SENTINEL LYMPH NODE BX Left 11/11/2014   Procedure: LEFT BREAST PARTIAL MASTECTOMY WITH NEEDLE LOCALIZATION TIMES TWO AND LEFT AXILLARY SENTINEL LYMPH NODE Biopsy;  Surgeon: Fanny Skates, MD;  Location: [facility];  Service: General;  Laterality: Left;  . REDUCTION MAMMAPLASTY Bilateral 11/11/2014  . SQUAMOUS CELL CARCINOMA EXCISION  1980's X 1   "nose"  . TONSILLECTOMY  ~ 1957  . TUBAL LIGATION  ?1984    Social History   Tobacco Use  . Smoking status: Never Smoker  . Smokeless tobacco: Never Used  Substance Use Topics  . Alcohol use: Yes    Alcohol/week: 5.0 standard drinks    Types: 5 Glasses of wine per week  . Drug use: No    Family History  Problem Relation Age of Onset  . Uterine cancer Sister 86  . Stroke Father        deceased  . Heart failure Father   . Dementia Mother        Lives in [place]  . Hypertension Mother   . Hyperlipidemia Mother   . Heart attack Maternal Grandmother   . Hypertension Maternal Grandfather   . Stroke Paternal Grandfather   . Hypertension Son     Allergies  Allergen Reactions  . Amlodipine Swelling  . Lanolin   . Tape Other (See Comments)    Redness (Bandaids also)    Medication list has been reviewed and updated.  Current Outpatient Medications on File Prior to Visit  Medication Sig Dispense Refill  . aMILoride (MIDAMOR) 5 MG tablet Take 1 tablet (5 mg total) by mouth daily. 90 tablet 3  . B Complex-C (B-COMPLEX WITH VITAMIN C) tablet Take 1 tablet by mouth daily.    . calcium-vitamin D (OSCAL WITH D) 250-125 MG-UNIT tablet Take 1 tablet by mouth 2 (two) times daily. 325mg  calcium, D3 (500)IU    . carvedilol (COREG) 12.5 MG tablet Take 1 tablet (12.5 mg total) by mouth 2  (two) times daily with a meal. 180 tablet 3  . Fexofenadine HCl (ALLEGRA ALLERGY PO) Take by mouth daily.    Marland Kitchen gabapentin (NEURONTIN) 300 MG capsule Take 1 capsule (300 mg total) by mouth at bedtime. 90 capsule 3  . hydrochlorothiazide (MICROZIDE) 12.5 MG capsule Take 1 capsule (12.5 mg total) by mouth daily. 90 capsule 3  . simvastatin (ZOCOR) 20 MG tablet Take 1 tablet (20 mg total) by mouth daily. 90 tablet 3  . tamoxifen (NOLVADEX) 20 MG tablet Take 1 tablet (20 mg total)  by mouth daily. 90 tablet 3  . telmisartan (MICARDIS) 80 MG tablet Take 1 tablet (80 mg total) by mouth daily. 90 tablet 3  . UNABLE TO FIND Please fit patient for a compression sleeve.  Diagnosis:  Bilateral breast cancer 1 each 0   No current facility-administered medications on file prior to visit.     Review of Systems:  As per HPI- otherwise negative. No fever chills, no chest pain shortness of breath Overall [name] feels like she is doing great  Physical Examination: Vitals:   10/03/18 1024  BP: 120/78  Pulse: 69  Resp: 14  SpO2: 96%   Vitals:   10/03/18 1024  Weight: 199 lb (90.3 kg)  Height: 4\' 11"  (1.499 m)   Body mass index is 40.19 kg/m. Ideal Body Weight: Weight in (lb) to have BMI = 25: 123.5  GEN: WDWN, NAD, Non-toxic, A & O x 3, obese, looks well HEENT: Atraumatic, Normocephalic. Neck supple. No masses, No LAD. Ears and Nose: No external deformity. CV: RRR, No M/G/R. No JVD. No thrill. No extra heart sounds. PULM: CTA B, no wheezes, crackles, rhonchi. No retractions. No resp. distress. No accessory muscle use. ABD: S, NT, ND, +BS. No rebound. No HSM. EXTR: No c/c/e NEURO Normal gait.  PSYCH: Normally interactive. Conversant. Not depressed or anxious appearing.  Calm demeanor.  Right knee: She has tenderness over the proximal tibia, and also seems to have a subcutaneous cyst or prominent plica over the anterior patella She has some tenderness of the lateral joint line.  Ligaments are  stable, minimal crepitus, no effusion  Assessment and Plan: Essential hypertension  Hypercholesteremia  Ductal carcinoma in situ (DCIS) of right breast  Acute pain of right knee - Plan: DG Knee Complete 4 Views Right  Here today for a follow-up visit Her breast cancer is managed by oncology, everything looks good 4 years out from surgery Her blood pressure is under fine control with current medication-carvedilol, Micardis, HCTZ Recent labs per oncology, will do a cholesterol and A1c at our next visit in 6 months Plan to get x-rays of her knee today.  Assuming no acute finding, would likely have her see orthopedics for further evaluation and treatment.  Also recommended that she use padding on her knee when the joint is under direct pressure, such as in certain yoga poses Asked her to see me in 6 months  Signed Lamar Blinks, MD  Procedure knee films as below, message to patient Placed referral to orthopedics  Dg Knee Complete 4 Views Right  Result Date: 10/03/2018 CLINICAL DATA:  Pt states that she fell a couple of months ago and since she has been having pain in her right knee, more so on the anterior/lateral side behind the patella. EXAM: RIGHT KNEE - COMPLETE 4+ VIEW COMPARISON:  None. FINDINGS: No acute fracture or dislocation. Mild tricompartmental osteoarthritis of the right knee. No significant joint effusion. No aggressive osseous lesion. No soft tissue abnormality. IMPRESSION: 1.  No acute osseous injury of the right knee. 2. Mild tricompartmental osteoarthritis of the right knee. Electronically Signed   By: Kathreen Devoid   On: 10/03/2018 16:48

## 2018-10-01 ENCOUNTER — Other Ambulatory Visit: Payer: Self-pay | Admitting: *Deleted

## 2018-10-01 DIAGNOSIS — C50812 Malignant neoplasm of overlapping sites of left female breast: Secondary | ICD-10-CM

## 2018-10-01 DIAGNOSIS — Z170 Estrogen receptor positive status [ER+]: Principal | ICD-10-CM

## 2018-10-01 NOTE — Progress Notes (Signed)
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 While still there, she had routine screening mammography with tomography 02/14/2014, which showed only a tiny breast cysts. Six-month follow-up exam was performed 08/25/2014, and now there was evidence of a possible focal asymmetry in the left breast. On 08/25/2014 at the [facility] of radiology she underwent a unilateral diagnostic mammography with tomography , and this confirmed a spiculated mass in the left breast superiorly. Sonography of this area found a poorly defined hypoechoic mass measuring 0.7 cm at the 11:00 position 8 cm from the nipple. There was also an adjacent cluster of cysts with a benign circumcised appearance.   Biopsy of the mass in question was recommended, but as the patient was moving to this area, this was performed here on 09/18/2014. The pathology from that procedure ([id number]) found an invasive ductal carcinoma, grade 2, estrogen receptor 100% positive, with strong staining intensity, progesterone receptor 43% positive, with strong staining intensity, with an MIB-1 of 30%, and no HER-2 amplification.   On 09/26/2014 the patient underwent bilateral breast MRI at [facility] imaging, showing a breast density category B. The right breast was unremarkable and there were no lymph nodes of concern. In the upper inner quadrant of the left breast there was an enhancing mass measuring 1.2 cm. In addition there  was a 5.5 cm area of linear enhancement extending anteriorly from this mass. 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  The patient's subsequent history is as detailed below   INTERVAL HISTORY: Laura Bush returns today for follow-up and treatment of her estrogen receptor positive right-sided breast cancer.   She continues on tamoxifen. She has some hot flashes, primarily at night, which she treats with gabapentin with relief. She also notes some issues with weight gain.   Since her last visit here, she underwent a digital diagnostic bilateral mammogram with tomography on 09/03/2018 showing: Breast Density Category B. There is no mammographic evidence for malignancy.   Dwan's last bone density screening on 07/26/2018, showed a T-score of -1.3, which is considered osteopenic.     REVIEW OF SYSTEMS: Alvine is doing yoga twice per week, and now that it's getting nicer outside, she has began to walk. She is also now a part of the [employer] program. She notes that she will occassionally have a pain in the area where her lymph node was taken. The patient denies unusual headaches, visual changes, nausea, vomiting, or dizziness. There has been no unusual cough, phlegm production, or pleurisy. This been no change in bowel or bladder habits. The patient denies unexplained fatigue or unexplained weight loss, bleeding, rash, or fever. A detailed review of systems was otherwise noncontributory.    PAST MEDICAL HISTORY: Past Medical History:  Diagnosis Date  . Asthma    in cold weather  . Breast cancer ([facility]) 09/18/14   left breast  . Breast cancer ([facility]) 10/07/14   bx left breast  . Breast cancer of upper-outer quadrant of left female breast ([facility]) 09/22/2014  . Cluster  headaches    in her 40's  . GERD (gastroesophageal reflux disease)   . Heart murmur    as a child (has outgrown)  . High cholesterol   . Hypertension   . Microscopic colitis   . OSA on CPAP   . Personal history  of radiation therapy   . Pneumonia 2000's X 1  . PONV (postoperative nausea and vomiting)   . Rosacea   . S/P radiation therapy 12/30/14-01/27/15   left breast 50Gy total dose  . 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 Venous insufficiency     PAST SURGICAL HISTORY: Past Surgical History:  Procedure Laterality Date  . ABDOMINAL HYSTERECTOMY  1999  . APPENDECTOMY  ~ 2006  . BREAST BIOPSY Left 10/2014; 10/2014  . BREAST LUMPECTOMY Left 2016  . BREAST REDUCTION SURGERY Bilateral 11/11/2014   Procedure: Bilateral Breast Reduction;  Surgeon: Crissie Reese, MD;  Location: [facility];  Service: Plastics;  Laterality: Bilateral;  . CARPAL TUNNEL RELEASE Bilateral    2 surgeries on right, 1 on left  . CESAREAN SECTION  1978; 1980  . COLONOSCOPY    . FOREARM FRACTURE SURGERY Right 2003 X 3  . FRACTURE SURGERY    . KNEE ARTHROSCOPY Bilateral    meniscus repair  . PARTIAL MASTECTOMY WITH AXILLARY SENTINEL LYMPH NODE BIOPSY Left 11/11/2014  . PARTIAL MASTECTOMY WITH NEEDLE LOCALIZATION AND AXILLARY SENTINEL LYMPH NODE BX Left 11/11/2014   Procedure: LEFT BREAST PARTIAL MASTECTOMY WITH NEEDLE LOCALIZATION TIMES TWO AND LEFT AXILLARY SENTINEL LYMPH NODE Biopsy;  Surgeon: Fanny Skates, MD;  Location: [facility];  Service: General;  Laterality: Left;  . REDUCTION MAMMAPLASTY Bilateral 11/11/2014  . SQUAMOUS CELL CARCINOMA EXCISION  1980's X 1   "nose"  . TONSILLECTOMY  ~ 1957  . TUBAL LIGATION  ?1984    FAMILY HISTORY: Family History  Problem Relation Age of Onset  . Uterine cancer Sister 55  . Stroke Father        deceased  . Heart failure Father   . Dementia Mother        Lives in [place]  . Hypertension Mother   . Hyperlipidemia Mother   . Heart attack Maternal Grandmother   . Hypertension Maternal Grandfather   . Stroke Paternal Grandfather   . Hypertension Son    The patient's father died at the age of 49 following a stroke. The patient's mother is still living, age 65. GEN had no brothers, one  sister. That sister was diagnosed with uterine cancer at the age of 48. There is no other history of breast or ovarian cancer in the family to the patient's knowledge   GYNECOLOGIC HISTORY:  No LMP recorded. Patient has had a hysterectomy.  menarche age 1, first live birth age 76, the patient is [place] P2.  She underwent total abdominal hysterectomy with bilateral salpingo-oophorectomy in 1999. She took hormone replacement for approximately 4 years. 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 Her husband, Louie Casa still works for an Orthoptist. Their daughter Luane School lives in [place] where she works as an Web designer. Their son Robert Bush lives in [place] where he works as a Education officer, museum. Robert's twins are now 50.68 years old and he has one boy and one girl. She has three grandchildren, the youngest <8 months.  ADVANCED DIRECTIVES:  Not in place. During the 12/03/2014 visited the patient was given the appropriate forms to complete and notarize at her discretion.   HEALTH MAINTENANCE: Social History   Tobacco Use  . Smoking status: Never Smoker  . Smokeless tobacco: Never Used  Substance Use Topics  . Alcohol use: Yes    Alcohol/week: 5.0 standard drinks    Types: 5 Glasses of wine per week  . Drug use: No    Colonoscopy:  2008  PAP:  Bone density: 2007/"normal"  Lipid panel:  Allergies  Allergen Reactions  . Amlodipine Swelling  . Lanolin   . Tape Other (See Comments)    Redness (Bandaids also)    Current Outpatient Medications  Medication Sig Dispense Refill  . aMILoride (MIDAMOR) 5 MG tablet Take 1 tablet (5 mg total) by mouth daily. 90 tablet 3  . B Complex-C (B-COMPLEX WITH VITAMIN C) tablet Take 1 tablet by mouth daily.    . carvedilol (COREG)  12.5 MG tablet Take 1 tablet (12.5 mg total) by mouth 2 (two) times daily with a meal. 180 tablet 3  . Cholecalciferol (VITAMIN D3) 2000 UNITS TABS Take 1 tablet by mouth daily.    Marland Kitchen Fexofenadine HCl (ALLEGRA ALLERGY PO) Take by mouth daily.    Marland Kitchen FLUAD 0.5 ML SUSY ADM 0.5ML IM UTD  0  . gabapentin (NEURONTIN) 300 MG capsule Take 1 capsule (300 mg total) by mouth at bedtime. 90 capsule 3  . hydrochlorothiazide (MICROZIDE) 12.5 MG capsule Take 1 capsule (12.5 mg total) by mouth daily. 90 capsule 3  . simvastatin (ZOCOR) 20 MG tablet Take 1 tablet (20 mg total) by mouth daily. 90 tablet 3  . tamoxifen (NOLVADEX) 20 MG tablet Take 1 tablet (20 mg total) by mouth daily. 90 tablet 3  . telmisartan (MICARDIS) 80 MG tablet Take 1 tablet (80 mg total) by mouth daily. 90 tablet 3  . UNABLE TO FIND Please fit patient for a compression sleeve.  Diagnosis:  Bilateral breast cancer 1 each 0   No current facility-administered medications for this visit.     OBJECTIVE:  Middle-aged white woman who appears well  Vitals:   10/02/18 1406  BP: 116/77  Pulse: (!) 59  Resp: 18  Temp: 98.6 F (37 C)  SpO2: 100%     Body mass index is 38.9 kg/m.    ECOG FS:0 - Asymptomatic  Sclerae unicteric, EOMs intact Oropharynx clear and moist No cervical or supraclavicular adenopathy Lungs no rales or rhonchi Heart regular rate and rhythm Abd soft, nontender, positive bowel sounds MSK no focal spinal tenderness, no upper extremity lymphedema Neuro: nonfocal, well oriented, appropriate affect Breasts: Right breast has undergone reduction mammoplasty.  There is no finding of concern.  The left breast is status post lumpectomy and radiation.  There is no evidence of disease recurrence.  Both axillae are benign.  LAB RESULTS:  CMP     Component Value Date/Time   NA 141 03/28/2018 1054   NA 142 05/29/2017 1002   K 4.7 03/28/2018 1054   K 3.9 05/29/2017 1002   CL 105 03/28/2018 1054   CO2 29 03/28/2018 1054     CO2 28 05/29/2017 1002   GLUCOSE 96 03/28/2018 1054   GLUCOSE 86 05/29/2017 1002   BUN 17 03/28/2018 1054   BUN 13.5 05/29/2017 1002   CREATININE 0.91 03/28/2018 1054   CREATININE 0.8 05/29/2017 1002   CALCIUM 9.9 03/28/2018 1054   CALCIUM 9.4 05/29/2017 1002   PROT  6.8 05/29/2017 1002   ALBUMIN 3.7 05/29/2017 1002   AST 18 05/29/2017 1002   ALT 19 05/29/2017 1002   ALKPHOS 39 (L) 05/29/2017 1002   BILITOT 0.48 05/29/2017 1002   GFRNONAA 68 (L) 11/05/2014 1142   GFRAA 78 (L) 11/05/2014 1142    INo results found for: SPEP, UPEP  Lab Results  Component Value Date   WBC 5.3 10/02/2018   NEUTROABS 3.0 10/02/2018   HGB 12.9 10/02/2018   HCT 38.6 10/02/2018   MCV 98.7 10/02/2018   PLT 166 10/02/2018      Chemistry      Component Value Date/Time   NA 141 03/28/2018 1054   NA 142 05/29/2017 1002   K 4.7 03/28/2018 1054   K 3.9 05/29/2017 1002   CL 105 03/28/2018 1054   CO2 29 03/28/2018 1054   CO2 28 05/29/2017 1002   BUN 17 03/28/2018 1054   BUN 13.5 05/29/2017 1002   CREATININE 0.91 03/28/2018 1054   CREATININE 0.8 05/29/2017 1002      Component Value Date/Time   CALCIUM 9.9 03/28/2018 1054   CALCIUM 9.4 05/29/2017 1002   ALKPHOS 39 (L) 05/29/2017 1002   AST 18 05/29/2017 1002   ALT 19 05/29/2017 1002   BILITOT 0.48 05/29/2017 1002       No results found for: LABCA2  No components found for: LABCA125  No results for input(s): INR in the last 168 hours.  Urinalysis    Component Value Date/Time   COLORURINE YELLOW 11/05/2014 1142   APPEARANCEUR CLEAR 11/05/2014 1142   LABSPEC 1.008 11/05/2014 1142   PHURINE 7.0 11/05/2014 1142   GLUCOSEU NEGATIVE 11/05/2014 1142   HGBUR NEGATIVE 11/05/2014 1142   BILIRUBINUR NEGATIVE 11/05/2014 1142   KETONESUR NEGATIVE 11/05/2014 1142   PROTEINUR NEGATIVE 11/05/2014 1142   UROBILINOGEN 0.2 11/05/2014 1142   NITRITE NEGATIVE 11/05/2014 1142   LEUKOCYTESUR NEGATIVE 11/05/2014 1142    STUDIES: Mm Diag Breast  Tomo Bilateral  Result Date: 09/03/2018 CLINICAL DATA:  Routine annual examination. History of left breast cancer status post lumpectomy in 2016. EXAM: DIGITAL DIAGNOSTIC BILATERAL MAMMOGRAM WITH CAD AND TOMO COMPARISON:  Previous exam(s). ACR Breast Density Category b: There are scattered areas of fibroglandular density. FINDINGS: Lumpectomy changes in the superior left breast. No mass, nonsurgical distortion, or suspicious microcalcification is identified in either breast to suggest malignancy. Mammographic images were processed with CAD. IMPRESSION: Lumpectomy changes on the left. No evidence of malignancy in either breast. RECOMMENDATION: Diagnostic mammogram is suggested in 1 year. (Code:DM-B-01Y) I have discussed the findings and recommendations with the patient. Results were also provided in writing at the conclusion of the visit. If applicable, a reminder letter will be sent to the patient regarding the next appointment. BI-RADS CATEGORY  2: Benign. Electronically Signed   By: Curlene Dolphin M.D.   On: 09/03/2018 10:38    ASSESSMENT: 28 y.o. [place], [place] woman status post left breast upper outer quadrant biopsy 09/18/2014 for a clinical T1 cN0 invasive ductal carcinoma, grade 2, estrogen and progesterone receptor positive, HER-2 not amplified, with an MIB-1 of 30%  (1) there is a 5.5 cm area of non-masslike enhancement seen in the left breast with biopsy 10/07/2014 showing atypical ductal hyperplasia  (2) status post left lumpectomy and sentinel lymph node sampling 11/11/2014 for a  pT1c  pN0, stage IA invasive ductal carcinoma, grade 3, repeat HER-2 again negative, with negative margins  (3) an Oncotype DX score of 11 predicts an 8% risk of outside the  breast recurrence within 10 years if the patient's only systemic therapy is tamoxifen for 5 years. It also predicts no benefit from chemotherapy  (4) Pathology from right reduction mammoplasty 11/11/2014 unexpectedly showed ductal carcinoma in  situ, estrogen receptor 100% positive, progesterone receptor 71% positive  (5) adjuvant radiation completed 01/27/2015  (6) anastrozole started June 2016, stopped 05/28/15 due to intolerance  (a) bone density 02/17/2015 was normal with a T score of -0.9  (b) bone density 07/26/2018 shows a T score of -1.3.  (7) tamoxifen started 07/14/15  (8) because of the incomplete resection on the right breast, bilateral yearly breast MRI are recommended, in addition to mammography.  (a) bilateral breast MRI 10/16/2015 was negative  (b) bilateral breast MRI 02/03/2017 showed no suspicious findings  (c) bilateral breast MRI 03/07/2018 showed no evidence of malignancy.   PLAN: Laura Bush is now just about 4 years out from definitive surgery for her breast cancer with no evidence of disease recurrence.  This is very favorable.  She is tolerating tamoxifen well and the plan will be to continue that a minimum of 5 years.  Because of the unexpected finding of DCIS in her contralateral breast at the time of reduction mammoplasty, with no clear margins obtained, and no adjuvant radiation, she is undergoing yearly breast MRI.  This is being scheduled for July  She will see me again in a year from now, after her next year's mammogram  She knows to call for any other issue that may develop before the next visit.   Hershell Brandl, Virgie Dad, MD  10/02/18 2:25 PM Medical Oncology and Hematology [facility] [address] [place], [place] [address] Tel. [phone]    Fax. [phone]  I, Jacqualyn Posey am acting as a Education administrator for Chauncey Cruel, MD.   I, Lurline Del MD, have reviewed the above documentation for accuracy and completeness, and I agree with the above.

## 2018-10-02 ENCOUNTER — Telehealth: Payer: Self-pay | Admitting: Oncology

## 2018-10-02 ENCOUNTER — Inpatient Hospital Stay: Payer: Medicare HMO | Attending: Oncology | Admitting: Oncology

## 2018-10-02 ENCOUNTER — Inpatient Hospital Stay: Payer: Medicare HMO

## 2018-10-02 VITALS — BP 116/77 | HR 59 | Temp 98.6°F | Resp 18 | Ht 59.0 in | Wt 192.6 lb

## 2018-10-02 DIAGNOSIS — E119 Type 2 diabetes mellitus without complications: Secondary | ICD-10-CM | POA: Diagnosis not present

## 2018-10-02 DIAGNOSIS — C50812 Malignant neoplasm of overlapping sites of left female breast: Secondary | ICD-10-CM

## 2018-10-02 DIAGNOSIS — D0511 Intraductal carcinoma in situ of right breast: Secondary | ICD-10-CM

## 2018-10-02 DIAGNOSIS — Z170 Estrogen receptor positive status [ER+]: Principal | ICD-10-CM

## 2018-10-02 DIAGNOSIS — Z79899 Other long term (current) drug therapy: Secondary | ICD-10-CM | POA: Diagnosis not present

## 2018-10-02 DIAGNOSIS — I10 Essential (primary) hypertension: Secondary | ICD-10-CM | POA: Insufficient documentation

## 2018-10-02 DIAGNOSIS — Z79810 Long term (current) use of selective estrogen receptor modulators (SERMs): Secondary | ICD-10-CM | POA: Diagnosis not present

## 2018-10-02 DIAGNOSIS — C50212 Malignant neoplasm of upper-inner quadrant of left female breast: Secondary | ICD-10-CM | POA: Diagnosis not present

## 2018-10-02 DIAGNOSIS — Z923 Personal history of irradiation: Secondary | ICD-10-CM | POA: Diagnosis not present

## 2018-10-02 DIAGNOSIS — M8580 Other specified disorders of bone density and structure, unspecified site: Secondary | ICD-10-CM | POA: Insufficient documentation

## 2018-10-02 LAB — CBC WITH DIFFERENTIAL (CANCER CENTER ONLY)
ABS IMMATURE GRANULOCYTES: 0.02 10*3/uL (ref 0.00–0.07)
BASOS ABS: 0 10*3/uL (ref 0.0–0.1)
Basophils Relative: 1 %
EOS PCT: 4 %
Eosinophils Absolute: 0.2 10*3/uL (ref 0.0–0.5)
HEMATOCRIT: 38.6 % (ref 36.0–46.0)
HEMOGLOBIN: 12.9 g/dL (ref 12.0–15.0)
Immature Granulocytes: 0 %
LYMPHS ABS: 1.5 10*3/uL (ref 0.7–4.0)
LYMPHS PCT: 29 %
MCH: 33 pg (ref 26.0–34.0)
MCHC: 33.4 g/dL (ref 30.0–36.0)
MCV: 98.7 fL (ref 80.0–100.0)
MONO ABS: 0.5 10*3/uL (ref 0.1–1.0)
Monocytes Relative: 9 %
NEUTROS ABS: 3 10*3/uL (ref 1.7–7.7)
Neutrophils Relative %: 57 %
Platelet Count: 166 10*3/uL (ref 150–400)
RBC: 3.91 MIL/uL (ref 3.87–5.11)
RDW: 11.9 % (ref 11.5–15.5)
WBC Count: 5.3 10*3/uL (ref 4.0–10.5)
nRBC: 0 % (ref 0.0–0.2)

## 2018-10-02 LAB — CMP (CANCER CENTER ONLY)
ALBUMIN: 3.9 g/dL (ref 3.5–5.0)
ALT: 26 U/L (ref 0–44)
AST: 21 U/L (ref 15–41)
Alkaline Phosphatase: 42 U/L (ref 38–126)
Anion gap: 12 (ref 5–15)
BUN: 14 mg/dL (ref 8–23)
CHLORIDE: 104 mmol/L (ref 98–111)
CO2: 27 mmol/L (ref 22–32)
CREATININE: 0.87 mg/dL (ref 0.44–1.00)
Calcium: 9.5 mg/dL (ref 8.9–10.3)
GFR, Est AFR Am: >60 mL/min (ref >60)
GFR, Estimated: >60 mL/min (ref >60)
Glucose, Bld: 112 mg/dL — ABNORMAL HIGH (ref 70–99)
POTASSIUM: 4.3 mmol/L (ref 3.5–5.1)
SODIUM: 143 mmol/L (ref 135–145)
Total Bilirubin: 0.3 mg/dL (ref 0.3–1.2)
Total Protein: 7.1 g/dL (ref 6.5–8.1)

## 2018-10-02 NOTE — Telephone Encounter (Signed)
Gave avs and calendar ° °

## 2018-10-03 ENCOUNTER — Ambulatory Visit (INDEPENDENT_AMBULATORY_CARE_PROVIDER_SITE_OTHER): Payer: Medicare HMO | Admitting: Family Medicine

## 2018-10-03 ENCOUNTER — Encounter: Payer: Self-pay | Admitting: Family Medicine

## 2018-10-03 ENCOUNTER — Ambulatory Visit (HOSPITAL_BASED_OUTPATIENT_CLINIC_OR_DEPARTMENT_OTHER)
Admission: RE | Admit: 2018-10-03 | Discharge: 2018-10-03 | Disposition: A | Discharge status: Home / Self Care | Payer: Medicare HMO | Source: Ambulatory Visit | Attending: Family Medicine | Admitting: Family Medicine

## 2018-10-03 VITALS — BP 120/78 | HR 69 | Resp 14 | Ht 59.0 in | Wt 199.0 lb

## 2018-10-03 DIAGNOSIS — I10 Essential (primary) hypertension: Secondary | ICD-10-CM | POA: Diagnosis not present

## 2018-10-03 DIAGNOSIS — M25561 Pain in right knee: Secondary | ICD-10-CM

## 2018-10-03 DIAGNOSIS — S8991XA Unspecified injury of right lower leg, initial encounter: Secondary | ICD-10-CM | POA: Diagnosis not present

## 2018-10-03 DIAGNOSIS — D0511 Intraductal carcinoma in situ of right breast: Secondary | ICD-10-CM

## 2018-10-03 DIAGNOSIS — E7800 Pure hypercholesterolemia, unspecified: Secondary | ICD-10-CM

## 2018-10-03 NOTE — Patient Instructions (Addendum)
Please consider getting your shingles vaccine at your drug store at your convenience  This is a 2 shot series, with doses given 2 to 6 months apart  Please stop at the imaging department on the ground floor, they will take x-rays of your right knee.  Then you can go home and I will be in touch with this report ASAP  Blood pressure looks fine Please see me in August for physical.  At that time we can check your cholesterol and blood sugar  Always wonderful to see you, have a great spring!

## 2018-10-03 NOTE — Addendum Note (Signed)
Addended by: Darreld Mclean on: 10/03/2018 06:19 PM   Modules accepted: Orders

## 2018-10-06 DIAGNOSIS — Z23 Encounter for immunization: Secondary | ICD-10-CM | POA: Diagnosis not present

## 2018-10-08 ENCOUNTER — Encounter: Payer: Self-pay | Admitting: Family Medicine

## 2019-01-18 DIAGNOSIS — Z23 Encounter for immunization: Secondary | ICD-10-CM | POA: Diagnosis not present

## 2019-01-29 DIAGNOSIS — R69 Illness, unspecified: Secondary | ICD-10-CM | POA: Diagnosis not present

## 2019-01-30 ENCOUNTER — Encounter: Payer: Self-pay | Admitting: Family Medicine

## 2019-02-11 DIAGNOSIS — Z0100 Encounter for examination of eyes and vision without abnormal findings: Secondary | ICD-10-CM | POA: Diagnosis not present

## 2019-02-12 DIAGNOSIS — Z0100 Encounter for examination of eyes and vision without abnormal findings: Secondary | ICD-10-CM | POA: Diagnosis not present

## 2019-02-18 DIAGNOSIS — R69 Illness, unspecified: Secondary | ICD-10-CM | POA: Diagnosis not present

## 2019-02-25 ENCOUNTER — Other Ambulatory Visit: Payer: Medicare HMO

## 2019-03-20 ENCOUNTER — Ambulatory Visit
Admission: RE | Admit: 2019-03-20 | Discharge: 2019-03-20 | Disposition: A | Discharge status: Home / Self Care | Payer: Medicare HMO | Source: Ambulatory Visit | Attending: Oncology | Admitting: Oncology

## 2019-03-20 ENCOUNTER — Other Ambulatory Visit: Payer: Self-pay

## 2019-03-20 DIAGNOSIS — N6489 Other specified disorders of breast: Secondary | ICD-10-CM | POA: Diagnosis not present

## 2019-03-20 DIAGNOSIS — M8580 Other specified disorders of bone density and structure, unspecified site: Secondary | ICD-10-CM

## 2019-03-20 DIAGNOSIS — Z170 Estrogen receptor positive status [ER+]: Secondary | ICD-10-CM

## 2019-03-20 DIAGNOSIS — C50812 Malignant neoplasm of overlapping sites of left female breast: Secondary | ICD-10-CM

## 2019-03-20 DIAGNOSIS — D0511 Intraductal carcinoma in situ of right breast: Secondary | ICD-10-CM

## 2019-03-20 MED ORDER — GADOBUTROL 1 MMOL/ML IV SOLN
8.0000 mL | Freq: Once | INTRAVENOUS | Status: AC | PRN
  Administered 2019-03-20: 8 mL via INTRAVENOUS

## 2019-03-21 ENCOUNTER — Telehealth: Payer: Self-pay

## 2019-03-21 NOTE — Telephone Encounter (Signed)
RN spoke with patient and informed of MRI results after MD reviewed.  Pt voiced understanding.  No further needs.

## 2019-03-23 ENCOUNTER — Encounter: Payer: Self-pay | Admitting: Oncology

## 2019-04-01 ENCOUNTER — Encounter: Payer: Self-pay | Admitting: Family Medicine

## 2019-04-01 NOTE — Progress Notes (Signed)
[place] at [facility] [address] [place], [place] [address] [phone]  Date:  04/03/2019   Name:  Laura Bush   DOB:  Oct 27, 1950   MRN:  [id number]  PCP:  Darreld Mclean, MD    Chief Complaint: Annual Exam (flu shot, med refills)   History of Present Illness:  Laura Bush is a 68 y.o. very pleasant female patient who presents with the following:  6 month follow-up visit today /CPE today  Last visit with myself in February History of breast cancer, diagnosed in 2016.  Also history of hypertension, hyperlipidemia, GERD, venous insufficiency Her oncologist is Dr. Jana Hakim.  She had bilateral breast cancer which was treated surgically, she is now taking tamoxifen for planned 5-year course of treatment.  They may continue this a bit longer- she is tolerating the med pretty well  She has annual breast MRI and also mammogram  A good friend of hers recently died due to colon cancer at a young age - this person was 58 and dx with late stage colon cancer [name] will be speaking at the funeral this weekend   She most recently saw her cardiologist, Dr. Oval Linsey, in December  amloride 5 mg daily Carvedilol 12.5 twice daily Gabapentin 300 at bedtime HCTZ 12.5 daily Simvastatin 20 daily Telmisartan 80 daily Tamoxifen  Can have flu shot today- done  Shingrix is complete Due for Cologuard next year Mammogram is up-to-date Labs done in February, but is due for lipids and A1c DEXA scan done in December  She has local twin grandchildren who are now 47 years old- she has two more grands in [place] who are 3 and 3 yo   They are all getting through the pandemic ok   She is s/p hyst   She has some difficulty with swallowing- some foods may seem to get stuck Just has trouble with food- not liquid.  More solid/ hard foods may get stuck She will have to sit and relax, drink some water and then will pass the food bolus down   Present for a year or so-may be getting a bit worse She has tried to eat more slowly/ smaller bites She is sensitive to gluten -it tends to make her feel bad -but does not have celiac per our knowledge  She is on CPAP for her OSA  She has had her machine for 5 years or more She does not have a local pulmonologist, needs to establish care  Patient Active Problem List   Diagnosis Date Noted  . Osteopenia 10/02/2018  . Malignant 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, estrogen receptor positive ([place]) 05/29/2017  . 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 Vitamin D deficiency 01/29/2016  . Preventative health care 01/29/2016  . Hot flashes due to tamoxifen 10/05/2015  . Diarrhea 05/27/2015  . Eczema 12/29/2014  . Venous insufficiency 10/16/2014  . Essential hypertension 10/01/2014  . Hypercholesteremia 10/01/2014  . GERD (gastroesophageal reflux disease) 10/01/2014    Past Medical History:  Diagnosis Date  . Asthma    in cold weather  . Breast cancer ([place]) 09/18/14   left breast  . Breast cancer ([place]) 10/07/14   bx left breast  . Breast cancer of upper-outer quadrant of left female breast ([place]) 09/22/2014  . Cluster headaches    in her 40's  . GERD (gastroesophageal reflux disease)   . Heart murmur  as a child (has outgrown)  . High cholesterol   . Hypertension   . Microscopic colitis   . OSA on CPAP   . Personal history of radiation therapy   . Pneumonia 2000's X 1  . PONV (postoperative nausea and vomiting)   . Rosacea   . S/P radiation therapy 12/30/14-01/27/15   left breast 50Gy total dose  . 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 Venous insufficiency     Past Surgical History:  Procedure Laterality Date  . ABDOMINAL HYSTERECTOMY  1999  . APPENDECTOMY  ~ 2006  . BREAST BIOPSY Left 10/2014; 10/2014  . BREAST LUMPECTOMY Left 2016  . BREAST REDUCTION SURGERY Bilateral 11/11/2014   Procedure: Bilateral Breast Reduction;  Surgeon:  Crissie Reese, MD;  Location: [facility];  Service: Plastics;  Laterality: Bilateral;  . CARPAL TUNNEL RELEASE Bilateral    2 surgeries on right, 1 on left  . CESAREAN SECTION  1978; 1980  . COLONOSCOPY    . FOREARM FRACTURE SURGERY Right 2003 X 3  . FRACTURE SURGERY    . KNEE ARTHROSCOPY Bilateral    meniscus repair  . PARTIAL MASTECTOMY WITH AXILLARY SENTINEL LYMPH NODE BIOPSY Left 11/11/2014  . PARTIAL MASTECTOMY WITH NEEDLE LOCALIZATION AND AXILLARY SENTINEL LYMPH NODE BX Left 11/11/2014   Procedure: LEFT BREAST PARTIAL MASTECTOMY WITH NEEDLE LOCALIZATION TIMES TWO AND LEFT AXILLARY SENTINEL LYMPH NODE Biopsy;  Surgeon: Fanny Skates, MD;  Location: [facility];  Service: General;  Laterality: Left;  . REDUCTION MAMMAPLASTY Bilateral 11/11/2014  . SQUAMOUS CELL CARCINOMA EXCISION  1980's X 1   "nose"  . TONSILLECTOMY  ~ 1957  . TUBAL LIGATION  ?1984    Social History   Tobacco Use  . Smoking status: Never Smoker  . Smokeless tobacco: Never Used  Substance Use Topics  . Alcohol use: Yes    Alcohol/week: 5.0 standard drinks    Types: 5 Glasses of wine per week  . Drug use: No    Family History  Problem Relation Age of Onset  . Uterine cancer Sister 74  . Stroke Father        deceased  . Heart failure Father   . Dementia Mother        Lives in [place]  . Hypertension Mother   . Hyperlipidemia Mother   . Heart attack Maternal Grandmother   . Hypertension Maternal Grandfather   . Stroke Paternal Grandfather   . Hypertension Son     Allergies  Allergen Reactions  . Amlodipine Swelling  . Lanolin   . Tape Other (See Comments)    Redness (Bandaids also)    Medication list has been reviewed and updated.  Current Outpatient Medications on File Prior to Visit  Medication Sig Dispense Refill  . aMILoride (MIDAMOR) 5 MG tablet Take 1 tablet (5 mg total) by mouth daily. 90 tablet 3  . B Complex-C (B-COMPLEX WITH VITAMIN C) tablet Take 1 tablet by mouth daily.    . calcium-vitamin D  (OSCAL WITH D) 250-125 MG-UNIT tablet Take 1 tablet by mouth 2 (two) times daily. 325mg  calcium, D3 (500)IU    . carvedilol (COREG) 12.5 MG tablet Take 1 tablet (12.5 mg total) by mouth 2 (two) times daily with a meal. 180 tablet 3  . Fexofenadine HCl (ALLEGRA ALLERGY PO) Take by mouth daily.    Marland Kitchen gabapentin (NEURONTIN) 300 MG capsule Take 1 capsule (300 mg total) by mouth at bedtime. 90 capsule 3  . hydrochlorothiazide (MICROZIDE) 12.5 MG capsule Take  1 capsule (12.5 mg total) by mouth daily. 90 capsule 3  . simvastatin (ZOCOR) 20 MG tablet Take 1 tablet (20 mg total) by mouth daily. 90 tablet 3  . tamoxifen (NOLVADEX) 20 MG tablet Take 1 tablet (20 mg total) by mouth daily. 90 tablet 3  . telmisartan (MICARDIS) 80 MG tablet Take 1 tablet (80 mg total) by mouth daily. 90 tablet 3  . UNABLE TO FIND Please fit patient for a compression sleeve.  Diagnosis:  Bilateral breast cancer 1 each 0   No current facility-administered medications on file prior to visit.     Review of Systems:  As per HPI- otherwise negative.  She does check some home BP - her numbers may fluctuate some 140/82, 117/76  If her BP is running low she may feel washed out No syncope, no CP or SOB  Physical Examination: Vitals:   04/03/19 1010  BP: 132/80  Pulse: 65  Resp: 16  Temp: 97.7 F (36.5 C)  SpO2: 95%   Vitals:   04/03/19 1010  Weight: 191 lb (86.6 kg)  Height: 4\' 11"  (1.499 m)   Body mass index is 38.58 kg/m. Ideal Body Weight: Weight in (lb) to have BMI = 25: 123.5  GEN: WDWN, NAD, Non-toxic, A & O x 3, overweight, looks well HEENT: Atraumatic, Normocephalic. Neck supple. No masses, No LAD.  TM within normal limits Ears and Nose: No external deformity. CV: RRR, No M/G/R. No JVD. No thrill. No extra heart sounds. PULM: CTA B, no wheezes, crackles, rhonchi. No retractions. No resp. distress. No accessory muscle use. ABD: S, NT, ND, +BS. No rebound. No HSM. EXTR: No c/c/e NEURO Normal gait.   PSYCH: Normally interactive. Conversant. Not depressed or anxious appearing.  Calm demeanor.    Assessment and Plan: Physical exam  Essential hypertension - Plan: CBC, Comprehensive metabolic panel  Hypercholesteremia - Plan: Lipid panel  Elevated glucose - Plan: Hemoglobin A1c  Vitamin D deficiency - Plan: Vitamin D (25 hydroxy)  Venous insufficiency  Osteopenia, unspecified location  Needs flu shot - Plan: Flu Vaccine QUAD High Dose(Fluad), CANCELED: Flu Vaccine QUAD 6+ mos PF IM (Fluarix Quad PF)  OSA on CPAP - Plan: Ambulatory referral to Neurology  Swallowing problem - Plan: Ambulatory referral to Gastroenterology  Here today for complete physical Referral to GI for concern about difficulty swallowing Referral to neurology to establish care for sleep apnea Flu shot given Labs pending as above  Signed Lamar Blinks, MD  Received her labs, message to patient Results for orders placed or performed in visit on 04/03/19  CBC  Result Value Ref Range   WBC 4.1 4.0 - 10.5 K/uL   RBC 3.84 (L) 3.87 - 5.11 Mil/uL   Platelets 157.0 150.0 - 400.0 K/uL   Hemoglobin 12.8 12.0 - 15.0 g/dL   HCT 37.6 36.0 - 46.0 %   MCV 97.9 78.0 - 100.0 fl   MCHC 34.2 30.0 - 36.0 g/dL   RDW 12.4 11.5 - 15.5 %  Comprehensive metabolic panel  Result Value Ref Range   Sodium 143 135 - 145 mEq/L   Potassium 4.1 3.5 - 5.1 mEq/L   Chloride 105 96 - 112 mEq/L   CO2 30 19 - 32 mEq/L   Glucose, Bld 86 70 - 99 mg/dL   BUN 15 6 - 23 mg/dL   Creatinine, Ser 0.85 0.40 - 1.20 mg/dL   Total Bilirubin 0.5 0.2 - 1.2 mg/dL   Alkaline Phosphatase 31 (L) 39 - 117 U/L  AST 19 0 - 37 U/L   ALT 23 0 - 35 U/L   Total Protein 6.4 6.0 - 8.3 g/dL   Albumin 4.2 3.5 - 5.2 g/dL   Calcium 9.7 8.4 - 10.5 mg/dL   GFR 66.50 >60.00 mL/min  Hemoglobin A1c  Result Value Ref Range   Hgb A1c MFr Bld 5.6 4.6 - 6.5 %  Lipid panel  Result Value Ref Range   Cholesterol 158 0 - 200 mg/dL   Triglycerides 169.0 (H)  0.0 - 149.0 mg/dL   HDL 49.60 >39.00 mg/dL   VLDL 33.8 0.0 - 40.0 mg/dL   LDL Cholesterol 75 0 - 99 mg/dL   Total CHOL/HDL Ratio 3    NonHDL 108.33   Vitamin D (25 hydroxy)  Result Value Ref Range   VITD 38.52 30.00 - 100.00 ng/mL    The 10-year ASCVD risk score Mikey Bussing DC Jr., et al., 2013) is: 10.2%   Values used to calculate the score:     Age: 19 years     Sex: Female     Is Non-Hispanic African American: No     Diabetic: No     Tobacco smoker: No     Systolic Blood Pressure: 791 mmHg     Is BP treated: Yes     HDL Cholesterol: 49.6 mg/dL     Total Cholesterol: 158 mg/dL

## 2019-04-02 NOTE — Patient Instructions (Addendum)
It was great to see you again today, but we are so sorry for the death of your friend.   I will be in touch with your labs ASAP Assuming all is well, let us plan to visit in 6 months I will refer you to Gastroenterology to discuss your swallowing issue, ?colonoscopy Will also set you up with neurology to discuss your sleep apnea/ CPAP machine  Continue to monitor your BP- some fluctuation is ok.  However is you are running lower than 115/75 on a regular basis we may want to decrease your BP meds   Health Maintenance After Age 30 After age 58, you are at a higher risk for certain long-term diseases and infections as well as injuries from falls. Falls are a major cause of broken bones and head injuries in people who are older than age 40. Getting regular preventive care can help to keep you healthy and well. Preventive care includes getting regular testing and making lifestyle changes as recommended by your health care provider. Talk with your health care provider about:  Which screenings and tests you should have. A screening is a test that checks for a disease when you have no symptoms.  A diet and exercise plan that is right for you. What should I know about screenings and tests to prevent falls? Screening and testing are the best ways to find a health problem early. Early diagnosis and treatment give you the best chance of managing medical conditions that are common after age 43. Certain conditions and lifestyle choices may make you more likely to have a fall. Your health care provider may recommend:  Regular vision checks. Poor vision and conditions such as cataracts can make you more likely to have a fall. If you wear glasses, make sure to get your prescription updated if your vision changes.  Medicine review. Work with your health care provider to regularly review all of the medicines you are taking, including over-the-counter medicines. Ask your health care provider about any side effects  that may make you more likely to have a fall. Tell your health care provider if any medicines that you take make you feel dizzy or sleepy.  Osteoporosis screening. Osteoporosis is a condition that causes the bones to get weaker. This can make the bones weak and cause them to break more easily.  Blood pressure screening. Blood pressure changes and medicines to control blood pressure can make you feel dizzy.  Strength and balance checks. Your health care provider may recommend certain tests to check your strength and balance while standing, walking, or changing positions.  Foot health exam. Foot pain and numbness, as well as not wearing proper footwear, can make you more likely to have a fall.  Depression screening. You may be more likely to have a fall if you have a fear of falling, feel emotionally low, or feel unable to do activities that you used to do.  Alcohol use screening. Using too much alcohol can affect your balance and may make you more likely to have a fall. What actions can I take to lower my risk of falls? General instructions  Talk with your health care provider about your risks for falling. Tell your health care provider if: ? You fall. Be sure to tell your health care provider about all falls, even ones that seem minor. ? You feel dizzy, sleepy, or off-balance.  Take over-the-counter and prescription medicines only as told by your health care provider. These include any supplements.  Eat a  healthy diet and maintain a healthy weight. A healthy diet includes low-fat dairy products, low-fat (lean) meats, and fiber from whole grains, beans, and lots of fruits and vegetables. Home safety  Remove any tripping hazards, such as rugs, cords, and clutter.  Install safety equipment such as grab bars in bathrooms and safety rails on stairs.  Keep rooms and walkways well-lit. Activity   Follow a regular exercise program to stay fit. This will help you maintain your balance. Ask  your health care provider what types of exercise are appropriate for you.  If you need a cane or walker, use it as recommended by your health care provider.  Wear supportive shoes that have nonskid soles. Lifestyle  Do not drink alcohol if your health care provider tells you not to drink.  If you drink alcohol, limit how much you have: ? 0-1 drink a day for women. ? 0-2 drinks a day for men.  Be aware of how much alcohol is in your drink. In the [place] one drink equals one typical bottle of beer (12 oz), one-half glass of wine (5 oz), or one shot of hard liquor (1 oz).  Do not use any products that contain nicotine or tobacco, such as cigarettes and e-cigarettes. If you need help quitting, ask your health care provider. Summary  Having a healthy lifestyle and getting preventive care can help to protect your health and wellness after age 29.  Screening and testing are the best way to find a health problem early and help you avoid having a fall. Early diagnosis and treatment give you the best chance for managing medical conditions that are more common for people who are older than age 94.  Falls are a major cause of broken bones and head injuries in people who are older than age 64. Take precautions to prevent a fall at home.  Work with your health care provider to learn what changes you can make to improve your health and wellness and to prevent falls. This information is not intended to replace advice given to you by your health care provider. Make sure you discuss any questions you have with your health care provider. Document Released: 06/07/2017 Document Revised: 11/15/2018 Document Reviewed: 06/07/2017 Elsevier Patient Education  2020 [employer].

## 2019-04-03 ENCOUNTER — Other Ambulatory Visit: Payer: Self-pay

## 2019-04-03 ENCOUNTER — Encounter: Payer: Self-pay | Admitting: Family Medicine

## 2019-04-03 ENCOUNTER — Ambulatory Visit (INDEPENDENT_AMBULATORY_CARE_PROVIDER_SITE_OTHER): Payer: Medicare HMO | Admitting: Family Medicine

## 2019-04-03 VITALS — BP 132/80 | HR 65 | Temp 97.7°F | Resp 16 | Ht 59.0 in | Wt 191.0 lb

## 2019-04-03 DIAGNOSIS — I872 Venous insufficiency (chronic) (peripheral): Secondary | ICD-10-CM | POA: Diagnosis not present

## 2019-04-03 DIAGNOSIS — E782 Mixed hyperlipidemia: Secondary | ICD-10-CM

## 2019-04-03 DIAGNOSIS — E7800 Pure hypercholesterolemia, unspecified: Secondary | ICD-10-CM | POA: Diagnosis not present

## 2019-04-03 DIAGNOSIS — I10 Essential (primary) hypertension: Secondary | ICD-10-CM | POA: Diagnosis not present

## 2019-04-03 DIAGNOSIS — M8580 Other specified disorders of bone density and structure, unspecified site: Secondary | ICD-10-CM

## 2019-04-03 DIAGNOSIS — Z23 Encounter for immunization: Secondary | ICD-10-CM

## 2019-04-03 DIAGNOSIS — G4733 Obstructive sleep apnea (adult) (pediatric): Secondary | ICD-10-CM

## 2019-04-03 DIAGNOSIS — R7309 Other abnormal glucose: Secondary | ICD-10-CM | POA: Diagnosis not present

## 2019-04-03 DIAGNOSIS — R1310 Dysphagia, unspecified: Secondary | ICD-10-CM | POA: Diagnosis not present

## 2019-04-03 DIAGNOSIS — D0511 Intraductal carcinoma in situ of right breast: Secondary | ICD-10-CM

## 2019-04-03 DIAGNOSIS — E559 Vitamin D deficiency, unspecified: Secondary | ICD-10-CM

## 2019-04-03 DIAGNOSIS — Z0000 Encounter for general adult medical examination without abnormal findings: Secondary | ICD-10-CM

## 2019-04-03 DIAGNOSIS — R232 Flushing: Secondary | ICD-10-CM

## 2019-04-03 DIAGNOSIS — Z9989 Dependence on other enabling machines and devices: Secondary | ICD-10-CM

## 2019-04-03 LAB — VITAMIN D 25 HYDROXY (VIT D DEFICIENCY, FRACTURES): VITD: 38.52 ng/mL (ref 30.00–100.00)

## 2019-04-03 LAB — COMPREHENSIVE METABOLIC PANEL
ALT: 23 U/L (ref 0–35)
AST: 19 U/L (ref 0–37)
Albumin: 4.2 g/dL (ref 3.5–5.2)
Alkaline Phosphatase: 31 U/L — ABNORMAL LOW (ref 39–117)
BUN: 15 mg/dL (ref 6–23)
CO2: 30 mEq/L (ref 19–32)
Calcium: 9.7 mg/dL (ref 8.4–10.5)
Chloride: 105 mEq/L (ref 96–112)
Creatinine, Ser: 0.85 mg/dL (ref 0.40–1.20)
GFR: 66.5 mL/min (ref >60.00)
Glucose, Bld: 86 mg/dL (ref 70–99)
Potassium: 4.1 mEq/L (ref 3.5–5.1)
Sodium: 143 mEq/L (ref 135–145)
Total Bilirubin: 0.5 mg/dL (ref 0.2–1.2)
Total Protein: 6.4 g/dL (ref 6.0–8.3)

## 2019-04-03 LAB — LIPID PANEL
Cholesterol: 158 mg/dL (ref 0–200)
HDL: 49.6 mg/dL (ref >39.00)
LDL Cholesterol: 75 mg/dL (ref 0–99)
NonHDL: 108.33
Total CHOL/HDL Ratio: 3
Triglycerides: 169 mg/dL — ABNORMAL HIGH (ref 0.0–149.0)
VLDL: 33.8 mg/dL (ref 0.0–40.0)

## 2019-04-03 LAB — CBC
HCT: 37.6 % (ref 36.0–46.0)
Hemoglobin: 12.8 g/dL (ref 12.0–15.0)
MCHC: 34.2 g/dL (ref 30.0–36.0)
MCV: 97.9 fl (ref 78.0–100.0)
Platelets: 157 10*3/uL (ref 150.0–400.0)
RBC: 3.84 Mil/uL — ABNORMAL LOW (ref 3.87–5.11)
RDW: 12.4 % (ref 11.5–15.5)
WBC: 4.1 10*3/uL (ref 4.0–10.5)

## 2019-04-03 LAB — HEMOGLOBIN A1C: Hgb A1c MFr Bld: 5.6 % (ref 4.6–6.5)

## 2019-04-07 MED ORDER — TAMOXIFEN CITRATE 20 MG PO TABS: 20.0000 mg | ORAL_TABLET | Freq: Every day | ORAL | 3 refills | Status: DC

## 2019-04-07 MED ORDER — HYDROCHLOROTHIAZIDE 12.5 MG PO CAPS: 12.5000 mg | ORAL_CAPSULE | Freq: Every day | ORAL | 3 refills | Status: DC

## 2019-04-07 MED ORDER — CARVEDILOL 12.5 MG PO TABS: 12.5000 mg | ORAL_TABLET | Freq: Two times a day (BID) | ORAL | 3 refills | Status: DC

## 2019-04-07 MED ORDER — SIMVASTATIN 20 MG PO TABS: 20.0000 mg | ORAL_TABLET | Freq: Every day | ORAL | 3 refills | Status: DC

## 2019-04-07 MED ORDER — AMILORIDE HCL 5 MG PO TABS: 5.0000 mg | ORAL_TABLET | Freq: Every day | ORAL | 3 refills | Status: DC

## 2019-04-07 MED ORDER — TELMISARTAN 80 MG PO TABS: 80.0000 mg | ORAL_TABLET | Freq: Every day | ORAL | 3 refills | Status: DC

## 2019-04-07 MED ORDER — GABAPENTIN 300 MG PO CAPS: 300.0000 mg | ORAL_CAPSULE | Freq: Every day | ORAL | 3 refills | Status: DC

## 2019-04-07 NOTE — Addendum Note (Signed)
Addended by: Lamar Blinks C on: 04/07/2019 05:28 PM   Modules accepted: Orders

## 2019-04-11 ENCOUNTER — Encounter: Payer: Self-pay | Admitting: Neurology

## 2019-04-11 ENCOUNTER — Ambulatory Visit (INDEPENDENT_AMBULATORY_CARE_PROVIDER_SITE_OTHER): Payer: Medicare HMO | Admitting: Neurology

## 2019-04-11 ENCOUNTER — Other Ambulatory Visit: Payer: Self-pay

## 2019-04-11 VITALS — BP 126/79 | HR 53 | Temp 98.2°F | Ht 59.0 in | Wt 188.0 lb

## 2019-04-11 DIAGNOSIS — G4733 Obstructive sleep apnea (adult) (pediatric): Secondary | ICD-10-CM | POA: Diagnosis not present

## 2019-04-11 DIAGNOSIS — Z9989 Dependence on other enabling machines and devices: Secondary | ICD-10-CM

## 2019-04-11 NOTE — Patient Instructions (Signed)
It was nice to meet you today.  Your sleep apnea is under very good control on your current AutoPap settings.  The machine seems to be working very well.  You are fully compliant with it.  You are up-to-date with your supplies through online orders which is okay.  For now, I would favor that you continue with the same settings on your machine and follow-up routinely in 1 year.  In the interim, should something happen to the machine, we would have to do sleep study testing To get you a new machine and you can always get in touch with [place] by phone to see about coming in for another visit or just doing a sleep study directly.

## 2019-04-11 NOTE — Progress Notes (Signed)
Subjective:    Patient ID: Laura Bush is a 68 y.o. female.  HPI     Star Age, MD, PhD [facility] [address] [place], [place] [address]  Dear Dr. Lorelei Pont, I saw your patient, Laura Bush, upon your kind request in my sleep clinic today for initial consultation of her sleep disorder, in particular, evaluation of her obstructive sleep apnea.  The patient is unaccompanied today.  As you know, [name] is a 68 year old right-handed woman with an underlying medical history of hypertension, hyperlipidemia, heart murmur, history of cluster headaches, reflux disease, breast cancer, status post radiation therapy and surgery, on tamoxifen, asthma, history of rosacea, history of venous insufficiency and pneumonia, and obesity, who reports a prior diagnosis of obstructive sleep apnea.  She has a CPAP machine.  She has not had evaluation in over 5 years.  Her machine is from about 2014 and I was able to review her latest compliance download for the past 30 days from 03/12/2019 through 04/10/2019 during which time she was fully compliant with treatment with an average usage of 8 hours and 3 minutes which is excellent, residual AHI at goal at 1.3/h, average pressure of 12 cm, she is actually on an AutoPap with a setting of 5 to 15 cm, leak on the low side with a 95th percentile at 3.7 L/min.  Prior sleep study results are not available for my review today.  She reports that she had sleep study testing in 2014 through the [facility].  She moved with her husband in December 2015 to [place] to be closer to their son who was expecting twins.  The twins are about 68 years old now.  She has a daughter in [place] who has a 34-year-old and a 50-year-old.  She sleeps well with her CPAP, she is fully compliant with it.  Her husband also has a CPAP machine and they both get their supplies online.  She has been using nasal pillows successfully.  Her  Epworth sleepiness score is 4 out of 24, fatigue severity score is 21 out of 63.  She sleeps without major interruptions, no nocturia or morning headaches are reported.  She generally is in bed around 10 and rise time is between 6 and 7.  She is retired from an [employer] based out of [employer].  Her husband still works.  She had a tonsillectomy at age 17.  Her weight has increased over time.  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  She had subsequent radiation therapy and is in her fifth year of tamoxifen.  She is a non-smoker and drinks a glass of wine essentially daily, caffeine and limitation in the form of coffee, 1 cup/day on average.  They have no pets in the household.    Her Past Medical History Is Significant For: Past Medical History:  Diagnosis Date  . Asthma    in cold weather  . Breast cancer ([place]) 09/18/14   left breast  . Breast cancer ([place]) 10/07/14   bx left breast  . Breast cancer of upper-outer quadrant of left female breast ([place]) 09/22/2014  . Cluster headaches    in her 40's  . GERD (gastroesophageal reflux disease)   . Heart murmur    as a child (has outgrown)  . High cholesterol   . Hypertension   .  Microscopic colitis   . OSA on CPAP   . Personal history of radiation therapy   . Pneumonia 2000's X 1  . PONV (postoperative nausea and vomiting)   . Rosacea   . S/P radiation therapy 12/30/14-01/27/15   left breast 50Gy total dose  . 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 Venous insufficiency     Her Past Surgical History Is Significant For: Past Surgical History:  Procedure Laterality Date  . ABDOMINAL HYSTERECTOMY  1999  . APPENDECTOMY  ~ 2006  . BREAST BIOPSY Left 10/2014; 10/2014  . BREAST LUMPECTOMY Left 2016  . BREAST REDUCTION SURGERY Bilateral 11/11/2014   Procedure: Bilateral Breast Reduction;  Surgeon: Crissie Reese,  MD;  Location: [facility];  Service: Plastics;  Laterality: Bilateral;  . CARPAL TUNNEL RELEASE Bilateral    2 surgeries on right, 1 on left  . CESAREAN SECTION  1978; 1980  . COLONOSCOPY    . FOREARM FRACTURE SURGERY Right 2003 X 3  . FRACTURE SURGERY    . KNEE ARTHROSCOPY Bilateral    meniscus repair  . PARTIAL MASTECTOMY WITH AXILLARY SENTINEL LYMPH NODE BIOPSY Left 11/11/2014  . PARTIAL MASTECTOMY WITH NEEDLE LOCALIZATION AND AXILLARY SENTINEL LYMPH NODE BX Left 11/11/2014   Procedure: LEFT BREAST PARTIAL MASTECTOMY WITH NEEDLE LOCALIZATION TIMES TWO AND LEFT AXILLARY SENTINEL LYMPH NODE Biopsy;  Surgeon: Fanny Skates, MD;  Location: [facility];  Service: General;  Laterality: Left;  . REDUCTION MAMMAPLASTY Bilateral 11/11/2014  . SQUAMOUS CELL CARCINOMA EXCISION  1980's X 1   "nose"  . TONSILLECTOMY  ~ 1957  . TUBAL LIGATION  ?1984    Her Family History Is Significant For: Family History  Problem Relation Age of Onset  . Uterine cancer Sister 32  . Stroke Father        deceased  . Heart failure Father   . Dementia Mother        Lives in [place]  . Hypertension Mother   . Hyperlipidemia Mother   . Heart attack Maternal Grandmother   . Hypertension Maternal Grandfather   . Stroke Paternal Grandfather   . Hypertension Son     Her Social History Is Significant For: Social History   Socioeconomic History  . Marital status: Married    Spouse name: Not on file  . Number of children: Not on file  . Years of education: Not on file  . Highest education level: Not on file  Occupational History  . Not on file  Social Needs  . Financial resource strain: Not on file  . Food insecurity    Worry: Not on file    Inability: Not on file  . Transportation needs    Medical: Not on file    Non-medical: Not on file  Tobacco Use  . Smoking status: Never Smoker  . Smokeless tobacco: Never Used  Substance and Sexual Activity  . Alcohol use: Yes    Alcohol/week: 5.0 standard drinks     Types: 5 Glasses of wine per week  . Drug use: No  . Sexual activity: Not Currently  Lifestyle  . Physical activity    Days per week: Not on file    Minutes per session: Not on file  . Stress: Not on file  Relationships  . Social Herbalist on phone: Not on file    Gets together: Not on file    Attends religious service: Not on file    Active member of club or organization: Not on  file    Attends meetings of clubs or organizations: Not on file    Relationship status: Not on file  Other Topics Concern  . Not on file  Social History Narrative   From [place].   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   2 children, boy and girl   Twin grandchildren on the way.   Quilting, sewing, reading.    Her Allergies Are:  Allergies  Allergen Reactions  . Amlodipine Swelling  . Lanolin   . Tape Other (See Comments)    Redness (Bandaids also)  :   Her Current Medications Are:  Outpatient Encounter Medications as of 04/11/2019  Medication Sig  . aMILoride (MIDAMOR) 5 MG tablet Take 1 tablet (5 mg total) by mouth daily.  . B Complex-C (B-COMPLEX WITH VITAMIN C) tablet Take 1 tablet by mouth daily.  . calcium-vitamin D (OSCAL WITH D) 250-125 MG-UNIT tablet Take 1 tablet by mouth 2 (two) times daily. 325mg  calcium, D3 (500)IU  . carvedilol (COREG) 12.5 MG tablet Take 1 tablet (12.5 mg total) by mouth 2 (two) times daily with a meal.  . Fexofenadine HCl (ALLEGRA ALLERGY PO) Take by mouth daily.  Marland Kitchen gabapentin (NEURONTIN) 300 MG capsule Take 1 capsule (300 mg total) by mouth at bedtime.  . hydrochlorothiazide (MICROZIDE) 12.5 MG capsule Take 1 capsule (12.5 mg total) by mouth daily.  . simvastatin (ZOCOR) 20 MG tablet Take 1 tablet (20 mg total) by mouth daily.  . tamoxifen (NOLVADEX) 20 MG tablet Take 1 tablet (20 mg total) by mouth daily.  Marland Kitchen telmisartan (MICARDIS) 80 MG tablet Take 1 tablet (80 mg total) by mouth daily.  Marland Kitchen UNABLE TO FIND  Please fit patient for a compression sleeve.  Diagnosis:  Bilateral breast cancer   No facility-administered encounter medications on file as of 04/11/2019.   :  Review of Systems:  Out of a complete 14 point review of systems, all are reviewed and negative with the exception of these symptoms as listed below: Review of Systems  Neurological:       Pt presents today, she moved to [place] about 4.5 yrs ago and uses a CPAP machine. She started the machine 07/2013. Everything is working well and she has no concerns. Pt states that she is just wanting to get established here in case something was to happen. DME: pt gets supplies through [employer].   Epworth Sleepiness Scale 0= would never doze 1= slight chance of dozing 2= moderate chance of dozing 3= high chance of dozing  Sitting and reading:1 Watching TV:1 Sitting inactive in a public place (ex. Theater or meeting):0 As a passenger in a car for an hour without a break:0 Lying down to rest in the afternoon:1 Sitting and talking to someone:0 Sitting quietly after lunch (no alcohol):1 In a car, while stopped in traffic:0 Total: 4    Objective:  Neurological Exam  Physical Exam Physical Examination:   Vitals:   04/11/19 1600  BP: 126/79  Pulse: (!) 53  Temp: 98.2 F (36.8 C)   General Examination: The patient is a very pleasant 68 y.o. female in no acute distress. She appears well-developed and well-nourished and well groomed.   HEENT: Normocephalic, atraumatic, pupils are equal, round and reactive to light and accommodation. Extraocular tracking is good without limitation to gaze excursion or nystagmus noted. Normal smooth pursuit is noted. Hearing is grossly intact. Face is symmetric with normal facial animation and normal facial sensation. Speech is clear with  no dysarthria noted. There is no hypophonia. There is no lip, neck/head, jaw or voice tremor. Neck is supple with full range of passive and active motion. There are no carotid  bruits on auscultation. Oropharynx exam reveals: no signif. mouth dryness, good dental hygiene and mild airway crowding, due to Small airway entry and longer uvula, tonsils absent, Mallampati class I.  Tongue protrudes centrally in palate elevates symmetrically.  Neck circumference is 15-1/4 inches.  Chest: Clear to auscultation without wheezing, rhonchi or crackles noted.  Heart: S1+S2+0, regular and normal without murmurs, rubs or gallops noted.   Abdomen: Soft, non-tender and non-distended with normal bowel sounds appreciated on auscultation.  Extremities: There is trace pitting edema in the distal lower extremities bilaterally. Pedal pulses are intact.  Skin: Warm and dry without trophic changes noted. There are no varicose veins.  Musculoskeletal: exam reveals no obvious joint deformities, tenderness or joint swelling or erythema.   Neurologically:  Mental status: The patient is awake, alert and oriented in all 4 spheres. Her immediate and remote memory, attention, language skills and fund of knowledge are appropriate. There is no evidence of aphasia, agnosia, apraxia or anomia. Speech is clear with normal prosody and enunciation. Thought process is linear. Mood is normal and affect is normal.  Cranial nerves II - XII are as described above under HEENT exam. In addition: shoulder shrug is normal with equal shoulder height noted. Motor exam: Normal bulk, strength and tone is noted. There is no tremor, Romberg is negative. Fine motor skills and coordination: grossly intact.  Cerebellar testing: No dysmetria or intention tremor on finger to nose testing. Heel to shin is unremarkable bilaterally. There is no truncal or gait ataxia.  Sensory exam: intact to light touch in the upper and lower extremities.  Gait, station and balance: She stands easily. No veering to one side is noted. No leaning to one side is noted. Posture is age-appropriate and stance is narrow based. Gait shows normal stride  length and normal pace. No problems turning are noted. Tandem walk is somewhat challenging for her.               Assessment and Plan:  In summary, Elanora W Bush is a very pleasant 68 y.o.-year old female with an underlying medical history of hypertension, hyperlipidemia, heart murmur, history of cluster headaches, reflux disease, breast cancer, status post radiation therapy and surgery, on tamoxifen, asthma, history of rosacea, history of venous insufficiency and pneumonia, and obesity, who was diagnosed with obstructive sleep apnea (OSA) some 6 years Ago when she was still Residing in [place]. She and her husband moved to [place] some 4-1/2 years ago.  She lives in [facility] and is getting her CPAP supplies online, she is fully compliant with her AutoPap.  She has good treatment results and her apnea score is very low.  She has a low air leak, uses nasal pillows.  She is fully compliant with treatment and continues to reap good results from her AutoPap machine which is about 68 years old.  Nevertheless, since it is working well for her, we will forego any changes in her treatment settings or new sleep study testing.  Should her machine need replacement at some point in the near future, she will have to undergo sleep study testing to update her diagnosis.  For now, she is commended for her treatment adherence, and encouraged to continue to work on weight loss and continue to maintain a healthy lifestyle.  She can follow-up routinely  in 1 year in sleep clinic, sooner if needed.  I answered all her questions today and she was in agreement.  Thank you very much for allowing me to participate in the care of this nice patient. If I can be of any further assistance to you please do not hesitate to call me at [phone].  Sincerely,   Star Age, MD, PhD

## 2019-05-09 ENCOUNTER — Encounter: Payer: Self-pay | Admitting: Internal Medicine

## 2019-05-09 ENCOUNTER — Ambulatory Visit: Payer: Medicare HMO | Admitting: Internal Medicine

## 2019-05-09 ENCOUNTER — Other Ambulatory Visit: Payer: Self-pay

## 2019-05-09 VITALS — BP 112/60 | HR 68 | Temp 97.5°F | Ht 59.0 in | Wt 190.4 lb

## 2019-05-09 DIAGNOSIS — K52832 Lymphocytic colitis: Secondary | ICD-10-CM | POA: Diagnosis not present

## 2019-05-09 DIAGNOSIS — R1310 Dysphagia, unspecified: Secondary | ICD-10-CM

## 2019-05-09 DIAGNOSIS — K219 Gastro-esophageal reflux disease without esophagitis: Secondary | ICD-10-CM | POA: Diagnosis not present

## 2019-05-09 NOTE — Patient Instructions (Signed)

## 2019-05-09 NOTE — Progress Notes (Signed)
HISTORY OF PRESENT ILLNESS:  Laura Bush is a pleasant 68 y.o. female with a remote history of lymphocytic colitis diagnosed in 2008 at the [facility] (previous colonoscopy report and biopsies reviewed).  She was first seen in this office October 2016 with diarrhea.  This was presumably secondary to lymphocytic colitis and treated successfully with budesonide.  I saw the patient January 2017 at which time she was doing better.  I recommended a tapering regimen of her budesonide and follow-up in 1 year.  She did successfully taper budesonide without recurrent diarrhea.  However she has not followed up until this time.  Patient continues to do well with regards to her bowel habits.  In 2018 she opted for Cologuard testing as a colon cancer screening strategy.  This was negative.  She presents at the encouragement of her primary provider, today with a new complaint of intermittent solid food dysphagia for over 1 year.  The problem has been progressive despite altering her eating habits.  She has had reflux symptoms for many years but only takes occasional Tums.  She feels like the reflux symptoms have been less problematic recently.  She does have a history of breast cancer and underwent radiation therapy.  She has had no significant weight change in recent years.  GI review of systems is otherwise negative.  Review of outside laboratories from April 03, 2019 finds unremarkable comprehensive metabolic panel.  Unremarkable CBC with hemoglobin 12.8.  Review of x-ray file shows no relevant abnormalities.  REVIEW OF SYSTEMS:  All non-GI ROS negative unless otherwise stated in the HPI (entirely negative)  Past Medical History:  Diagnosis Date  . Asthma    in cold weather  . Breast cancer ([place]) 09/18/14   left breast  . Breast cancer ([facility]) 10/07/14   bx left breast  . Breast cancer of upper-outer quadrant of left female breast ([facility]) 09/22/2014  . Cluster headaches    in her 40's  . GERD  (gastroesophageal reflux disease)   . Heart murmur    as a child (has outgrown)  . High cholesterol   . Hypertension   . Microscopic colitis   . OSA on CPAP   . Personal history of radiation therapy   . Pneumonia 2000's X 1  . PONV (postoperative nausea and vomiting)   . Rosacea   . S/P radiation therapy 12/30/14-01/27/15   left breast 50Gy total dose  . 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 Venous insufficiency     Past Surgical History:  Procedure Laterality Date  . ABDOMINAL HYSTERECTOMY  1999  . APPENDECTOMY  ~ 2006  . BREAST BIOPSY Left 10/2014; 10/2014  . BREAST LUMPECTOMY Left 2016  . BREAST REDUCTION SURGERY Bilateral 11/11/2014   Procedure: Bilateral Breast Reduction;  Surgeon: Crissie Reese, MD;  Location: [facility];  Service: Plastics;  Laterality: Bilateral;  . CARPAL TUNNEL RELEASE Bilateral    2 surgeries on right, 1 on left  . CESAREAN SECTION  1978; 1980  . COLONOSCOPY    . FOREARM FRACTURE SURGERY Right 2003 X 3  . FRACTURE SURGERY    . KNEE ARTHROSCOPY Bilateral    meniscus repair  . PARTIAL MASTECTOMY WITH AXILLARY SENTINEL LYMPH NODE BIOPSY Left 11/11/2014  . PARTIAL MASTECTOMY WITH NEEDLE LOCALIZATION AND AXILLARY SENTINEL LYMPH NODE BX Left 11/11/2014   Procedure: LEFT BREAST PARTIAL MASTECTOMY WITH NEEDLE LOCALIZATION TIMES TWO AND LEFT AXILLARY SENTINEL LYMPH NODE Biopsy;  Surgeon: Fanny Skates, MD;  Location: [facility];  Service: General;  Laterality: Left;  . REDUCTION MAMMAPLASTY Bilateral 11/11/2014  . SQUAMOUS CELL CARCINOMA EXCISION  1980's X 1   "nose"  . TONSILLECTOMY  ~ 1957  . TUBAL LIGATION  ?1984    Social History Laura Bush  reports that she has never smoked. She has never used smokeless tobacco. She reports current alcohol use of about 5.0 standard drinks of alcohol per week. She reports that she does not use drugs.  family history includes Dementia in her mother; Heart attack in her maternal grandmother; Heart failure in her father;  Hyperlipidemia in her mother; Hypertension in her maternal grandfather, mother, and son; Stroke in her father and paternal grandfather; Uterine cancer (age of onset: 6) in her sister.  Allergies  Allergen Reactions  . Amlodipine Swelling  . Lanolin   . Tape Other (See Comments)    Redness (Bandaids also)       PHYSICAL EXAMINATION: Vital signs: BP 112/60   Pulse 68   Temp (!) 97.5 F (36.4 C)   Ht 4\' 11"  (1.499 m)   Wt 190 lb 6.4 oz (86.4 kg)   SpO2 97%   BMI 38.46 kg/m   Constitutional: Pleasant, generally well-appearing, no acute distress Psychiatric: alert and oriented x3, cooperative Eyes: extraocular movements intact, anicteric, conjunctiva pink Mouth: oral pharynx moist, no lesions Neck: supple no lymphadenopathy Cardiovascular: heart regular rate and rhythm, no murmur Lungs: clear to auscultation bilaterally Abdomen: soft, obese, nontender, nondistended, no obvious ascites, no peritoneal signs, normal bowel sounds, no organomegaly Rectal: Omitted Extremities: no lower extremity edema bilaterally Skin: no lesions on visible extremities Neuro: No focal deficits. No asterixis.    ASSESSMENT:  1.  Greater than 1 year history of slowly progressive intermittent solid food dysphasia.  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  Radiation-induced stricture less likely 2.  Chronic GERD.  Intermittent symptoms.  Treated with on-demand antacids 3.  Obesity 4.  History of lymphocytic colitis.  In remission 5.  Colonoscopy 2008- for neoplasia.  Negative Cologuard 2018  PLAN:  1.  Reflux precautions 2.  Weight loss 3.  Schedule upper endoscopy with probable esophageal dilation.The nature of the procedure, as well as the risks, benefits, and alternatives were carefully and thoroughly reviewed with the patient. Ample time for discussion and questions allowed. The patient understood, was satisfied, and agreed to proceed. 4.  May need PPI therapy.  We discussed this 5.  Colon cancer screening  strategies to be discussed between the patient and her primary provider around 3 years from her prior negative Cologuard testing (i.e. August 2021).  She understands A copy this consultation note has been sent to Dr. Lorelei Pont

## 2019-05-13 ENCOUNTER — Encounter: Payer: Self-pay | Admitting: Internal Medicine

## 2019-05-13 ENCOUNTER — Ambulatory Visit (AMBULATORY_SURGERY_CENTER): Payer: Medicare HMO | Admitting: Internal Medicine

## 2019-05-13 ENCOUNTER — Other Ambulatory Visit: Payer: Self-pay

## 2019-05-13 VITALS — BP 138/54 | HR 58 | Temp 98.6°F | Resp 15 | Ht 59.0 in | Wt 190.0 lb

## 2019-05-13 DIAGNOSIS — K449 Diaphragmatic hernia without obstruction or gangrene: Secondary | ICD-10-CM

## 2019-05-13 DIAGNOSIS — R1310 Dysphagia, unspecified: Secondary | ICD-10-CM | POA: Diagnosis not present

## 2019-05-13 DIAGNOSIS — K219 Gastro-esophageal reflux disease without esophagitis: Secondary | ICD-10-CM

## 2019-05-13 DIAGNOSIS — K222 Esophageal obstruction: Secondary | ICD-10-CM

## 2019-05-13 MED ORDER — PANTOPRAZOLE SODIUM 40 MG PO TBEC: 40.0000 mg | DELAYED_RELEASE_TABLET | Freq: Every day | ORAL | 3 refills | Status: DC

## 2019-05-13 MED ORDER — SODIUM CHLORIDE 0.9 % IV SOLN: 500.0000 mL | Freq: Once | INTRAVENOUS | Status: DC

## 2019-05-13 NOTE — Progress Notes (Signed)
Pt's states no medical or surgical changes since previsit or office visit.  Laura Bush

## 2019-05-13 NOTE — Patient Instructions (Signed)
Thank you for allowing [place] to care for you today!  Follow post-dilation diet today. Handout provided.  Resume medications today.  Resume your normal diet and activities tomorrow 05/14/19.  New medicine:  Pantoprazole 40 mg by mouth daily.  #30 with 11 refills.  Follow up with Dr Henrene Pastor in 6-8 weeks.  We will call to schedule.      YOU HAD AN ENDOSCOPIC PROCEDURE TODAY AT [facility]:   Refer to the procedure report that was given to you for any specific questions about what was found during the examination.  If the procedure report does not answer your questions, please call your gastroenterologist to clarify.  If you requested that your care partner not be given the details of your procedure findings, then the procedure report has been included in a sealed envelope for you to review at your convenience later.  YOU SHOULD EXPECT: Some feelings of bloating in the abdomen. Passage of more gas than usual.  Walking can help get rid of the air that was put into your GI tract during the procedure and reduce the bloating. If you had a lower endoscopy (such as a colonoscopy or flexible sigmoidoscopy) you may notice spotting of blood in your stool or on the toilet paper. If you underwent a bowel prep for your procedure, you may not have a normal bowel movement for a few days.  Please Note:  You might notice some irritation and congestion in your nose or some drainage.  This is from the oxygen used during your procedure.  There is no need for concern and it should clear up in a day or so.  SYMPTOMS TO REPORT IMMEDIATELY:    Following upper endoscopy (EGD)  Vomiting of blood or coffee ground material  New chest pain or pain under the shoulder blades  Painful or persistently difficult swallowing  New shortness of breath  Fever of 100F or higher  Black, tarry-looking stools  For urgent or emergent issues, a gastroenterologist can be reached at any hour by calling [phone]  [phone].   DIET:  We do recommend a small meal at first, but then you may proceed to your regular diet.  Drink plenty of fluids but you should avoid alcoholic beverages for 24 hours.  ACTIVITY:  You should plan to take it easy for the rest of today and you should NOT DRIVE or use heavy machinery until tomorrow (because of the sedation medicines used during the test).    FOLLOW UP: Our staff will call the number listed on your records 48-72 hours following your procedure to check on you and address any questions or concerns that you may have regarding the information given to you following your procedure. If we do not reach you, we will leave a message.  We will attempt to reach you two times.  During this call, we will ask if you have developed any symptoms of COVID 19. If you develop any symptoms (ie: fever, flu-like symptoms, shortness of breath, cough etc.) before then, please call [phone].  If you test positive for Covid 19 in the 2 weeks post procedure, please call and report this information to [place].    If any biopsies were taken you will be contacted by phone or by letter within the next 1-3 weeks.  Please call us at [phone] if you have not heard about the biopsies in 3 weeks.    SIGNATURES/CONFIDENTIALITY: You and/or your care partner have signed paperwork which will be entered into  your electronic medical record.  These signatures attest to the fact that that the information above on your After Visit Summary has been reviewed and is understood.  Full responsibility of the confidentiality of this discharge information lies with you and/or your care-partner.

## 2019-05-13 NOTE — Progress Notes (Signed)
PT taken to PACU. Monitors in place. VSS. Report given to RN. 

## 2019-05-13 NOTE — Op Note (Signed)
[place] Patient Name: Laura Bush Procedure Date: 05/13/2019 7:46 AM MRN: [id number] Endoscopist: Docia Chuck. Henrene Pastor , MD Age: 68 Referring MD:  Date of Birth: March 10, 1951 Gender: Female Account #: [id number] Procedure:                Upper GI endoscopy with [facility] dilation of the                            esophagus. 61 French Indications:              Dysphagia, Esophageal reflux Medicines:                Monitored Anesthesia Care Procedure:                Pre-Anesthesia Assessment:                           - Prior to the procedure, a History and Physical                            was performed, and patient medications and                            allergies were reviewed. The patient's tolerance of                            previous anesthesia was also reviewed. The risks                            and benefits of the procedure and the sedation                            options and risks were discussed with the patient.                            All questions were answered, and informed consent                            was obtained. Prior Anticoagulants: The patient has                            taken no previous anticoagulant or antiplatelet                            agents. ASA Grade Assessment: II - A patient with                            mild systemic disease. After reviewing the risks                            and benefits, the patient was deemed in                            satisfactory condition to undergo the procedure.  After obtaining informed consent, the endoscope was                            passed under direct vision. Throughout the                            procedure, the patient's blood pressure, pulse, and                            oxygen saturations were monitored continuously. The                            Endoscope was introduced through the mouth, and                            advanced to the second part  of duodenum. The upper                            GI endoscopy was accomplished without difficulty.                            The patient tolerated the procedure well. Scope In: Scope Out: Findings:                 One benign-appearing, intrinsic moderate stenosis                            was found 35 cm from the incisors. There was                            associated esophagitis. See the photographs. This                            stenosis measured 1.5 cm (inner diameter). The                            scope was withdrawn after completing the endoscopic                            survey. Dilation was performed with a Maloney                            dilator with no resistance at 4 Fr.                           The exam of the esophagus was otherwise normal.                           The stomach was normal except for a sliding hiatal                            hernia.                           The examined  duodenum was normal.                           The cardia and gastric fundus were normal on                            retroflexion. Complications:            No immediate complications. Estimated Blood Loss:     Estimated blood loss: none. Impression:               1. GERD with esophagitis and esophageal stricture                           2. Status post esophageal dilation. Recommendation:           1. Post dilation diet                           2. Prescribe pantoprazole 40 mg daily; #30; 11                            refills                           3. Office follow-up with Dr. Henrene Pastor in 6 to 8 weeks Docia Chuck. Henrene Pastor, MD 05/13/2019 8:35:43 AM This report has been signed electronically.

## 2019-05-15 ENCOUNTER — Telehealth: Payer: Self-pay

## 2019-05-15 NOTE — Telephone Encounter (Signed)
  Follow up Call-  Call back number 05/13/2019  Post procedure Call Back phone  # [phone]  Permission to leave phone message Yes  Some recent data might be hidden     Patient questions:  Do you have a fever, pain , or abdominal swelling? No. Pain Score  0 *  Have you tolerated food without any problems? Yes.    Have you been able to return to your normal activities? Yes.    Do you have any questions about your discharge instructions: Diet   No. Medications  No. Follow up visit  No.  Do you have questions or concerns about your Care? No.  Actions: * If pain score is 4 or above: No action needed, pain <4.  1. Have you developed a fever since your procedure? No  2.   Have you had an respiratory symptoms (SOB or cough) since your procedure? No 3.   Have you tested positive for COVID 19 since your procedure No  4.   Have you had any family members/close contacts diagnosed with the COVID 19 since your procedure?  No   If yes to any of these questions please route to Joylene John, RN and Alphonsa Gin, RN.

## 2019-06-28 ENCOUNTER — Ambulatory Visit: Payer: Medicare HMO | Admitting: Internal Medicine

## 2019-06-28 ENCOUNTER — Other Ambulatory Visit: Payer: Self-pay

## 2019-06-28 ENCOUNTER — Encounter: Payer: Self-pay | Admitting: Internal Medicine

## 2019-06-28 VITALS — BP 160/80 | HR 68 | Temp 97.9°F | Ht 59.0 in | Wt 195.4 lb

## 2019-06-28 DIAGNOSIS — R1310 Dysphagia, unspecified: Secondary | ICD-10-CM

## 2019-06-28 DIAGNOSIS — K222 Esophageal obstruction: Secondary | ICD-10-CM | POA: Diagnosis not present

## 2019-06-28 DIAGNOSIS — K219 Gastro-esophageal reflux disease without esophagitis: Secondary | ICD-10-CM | POA: Diagnosis not present

## 2019-06-28 MED ORDER — PANTOPRAZOLE SODIUM 40 MG PO TBEC: 40.0000 mg | DELAYED_RELEASE_TABLET | Freq: Every day | ORAL | 3 refills | Status: DC

## 2019-06-28 NOTE — Patient Instructions (Signed)
We have sent the following medications to your pharmacy for you to pick up at your convenience:  Protonix  Please follow up in one year

## 2019-06-28 NOTE — Progress Notes (Signed)
HISTORY OF PRESENT ILLNESS:  Laura Bush is a 68 y.o. female with a remote history of lymphocytic colitis diagnosed in 2008 at the [facility] (previous colonoscopy report and biopsies reviewed).  She was last seen in this office May 09, 2019 regarding a greater than 1 year history of slowly progressive intermittent solid food dysphagia and chronic GERD.  She subsequently underwent upper endoscopy May 13, 2019.  She was found to have a peptic stricture with associated erosive esophagitis.  Esophagus was dilated with a 54 [place] Maloney dilator.  She was placed on pantoprazole 40 mg daily.  Follow-up at this time arranged.  Patient is pleased to report that she has had no further issues with dysphagia.  No reflux symptoms.  No appreciable medication side effects.  No new complaints.  REVIEW OF SYSTEMS:  All non-GI ROS negative unless otherwise stated in the HPI.  Past Medical History:  Diagnosis Date  . Asthma    in cold weather  . Breast cancer ([place]) 09/18/14   left breast  . Breast cancer ([place]) 10/07/14   bx left breast  . Breast cancer of upper-outer quadrant of left female breast ([place]) 09/22/2014  . Cluster headaches    in her 40's  . GERD (gastroesophageal reflux disease)   . Heart murmur    as a child (has outgrown)  . High cholesterol   . Hypertension   . Microscopic colitis   . OSA on CPAP   . Personal history of radiation therapy   . Pneumonia 2000's X 1  . PONV (postoperative nausea and vomiting)   . Rosacea   . S/P radiation therapy 12/30/14-01/27/15   left breast 50Gy total dose  . 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 Venous insufficiency     Past Surgical History:  Procedure Laterality Date  . ABDOMINAL HYSTERECTOMY  1999  . APPENDECTOMY  ~ 2006  . BREAST BIOPSY Left 10/2014  . BREAST LUMPECTOMY Left 2016  . BREAST REDUCTION SURGERY Bilateral 11/11/2014   Procedure: Bilateral Breast Reduction;  Surgeon: Crissie Reese, MD;  Location: [facility];   Service: Plastics;  Laterality: Bilateral;  . CARPAL TUNNEL RELEASE Bilateral    2 surgeries on right, 1 on left  . CESAREAN SECTION  1978; 1980  . COLONOSCOPY    . FOREARM FRACTURE SURGERY Right 2003 X 3  . KNEE ARTHROSCOPY Bilateral    meniscus repair  . PARTIAL MASTECTOMY WITH NEEDLE LOCALIZATION AND AXILLARY SENTINEL LYMPH NODE BX Left 11/11/2014   Procedure: LEFT BREAST PARTIAL MASTECTOMY WITH NEEDLE LOCALIZATION TIMES TWO AND LEFT AXILLARY SENTINEL LYMPH NODE Biopsy;  Surgeon: Fanny Skates, MD;  Location: [facility];  Service: General;  Laterality: Left;  . SQUAMOUS CELL CARCINOMA EXCISION  1980's X 1   "nose"  . TONSILLECTOMY  ~ 1957  . TUBAL LIGATION  ?1984    Social History [place] Bush  reports that she has never smoked. She has never used smokeless tobacco. She reports current alcohol use of about 5.0 standard drinks of alcohol per week. She reports that she does not use drugs.  family history includes Dementia in her mother; Heart attack in her maternal grandmother; Heart failure in her father; Hyperlipidemia in her mother; Hypertension in her maternal grandfather and mother; Stroke in her father and paternal grandfather; Uterine cancer (age of onset: 27) in her sister.  Allergies  Allergen Reactions  . Amlodipine Swelling  . Lanolin   . Tape Other (See Comments)    Redness (  Bandaids also)       PHYSICAL EXAMINATION: Vital signs: BP (!) 160/80   Pulse 68   Temp 97.9 F (36.6 C)   Ht 4\' 11"  (1.499 m)   Wt 195 lb 6.4 oz (88.6 kg)   BMI 39.47 kg/m   Constitutional: generally well-appearing, no acute distress Psychiatric: alert and oriented x3, cooperative Eyes:  anicteric,  Mouth: Mask Abdomen: Not reexamined Neuro: No focal deficits.   ASSESSMENT:  1.  GERD complicated by esophagitis and peptic stricture.  Asymptomatic post dilation on PPI 2.  Remote history of lymphocytic colitis diagnosed at [facility] 2008.  Asymptomatic   PLAN:  1.   Reflux precautions 2.  Continue pantoprazole 40 mg daily.  Medication refilled for 1 year 3.  Medication side effects, effects and risks reviewed in detail.  The patient had several questions which were answered to her satisfaction including the question of calcium absorption and osteoporosis. 4.  Routine office follow-up in 1 year.  Contact the office in the interim for any questions or problems 15 minutes spent face-to-face with the patient.  Greater than 50% of the time used for counseling regarding her complicated GERD and its management as well as follow-up

## 2019-07-09 DIAGNOSIS — L821 Other seborrheic keratosis: Secondary | ICD-10-CM | POA: Diagnosis not present

## 2019-07-09 DIAGNOSIS — L570 Actinic keratosis: Secondary | ICD-10-CM | POA: Diagnosis not present

## 2019-07-09 DIAGNOSIS — D225 Melanocytic nevi of trunk: Secondary | ICD-10-CM | POA: Diagnosis not present

## 2019-07-09 DIAGNOSIS — D1801 Hemangioma of skin and subcutaneous tissue: Secondary | ICD-10-CM | POA: Diagnosis not present

## 2019-07-09 DIAGNOSIS — R208 Other disturbances of skin sensation: Secondary | ICD-10-CM | POA: Diagnosis not present

## 2019-07-09 DIAGNOSIS — L738 Other specified follicular disorders: Secondary | ICD-10-CM | POA: Diagnosis not present

## 2019-07-09 DIAGNOSIS — L728 Other follicular cysts of the skin and subcutaneous tissue: Secondary | ICD-10-CM | POA: Diagnosis not present

## 2019-07-09 DIAGNOSIS — L820 Inflamed seborrheic keratosis: Secondary | ICD-10-CM | POA: Diagnosis not present

## 2019-07-09 DIAGNOSIS — L905 Scar conditions and fibrosis of skin: Secondary | ICD-10-CM | POA: Diagnosis not present

## 2019-07-09 DIAGNOSIS — L814 Other melanin hyperpigmentation: Secondary | ICD-10-CM | POA: Diagnosis not present

## 2019-07-23 ENCOUNTER — Ambulatory Visit: Payer: Medicare HMO | Admitting: Cardiovascular Disease

## 2019-07-23 ENCOUNTER — Other Ambulatory Visit: Payer: Self-pay

## 2019-07-23 ENCOUNTER — Encounter: Payer: Self-pay | Admitting: Cardiovascular Disease

## 2019-07-23 VITALS — BP 133/73 | HR 62 | Temp 97.1°F | Ht 59.0 in | Wt 194.2 lb

## 2019-07-23 DIAGNOSIS — R0600 Dyspnea, unspecified: Secondary | ICD-10-CM

## 2019-07-23 DIAGNOSIS — E7800 Pure hypercholesterolemia, unspecified: Secondary | ICD-10-CM | POA: Diagnosis not present

## 2019-07-23 DIAGNOSIS — I10 Essential (primary) hypertension: Secondary | ICD-10-CM

## 2019-07-23 DIAGNOSIS — R0609 Other forms of dyspnea: Secondary | ICD-10-CM

## 2019-07-23 MED ORDER — HYDROCHLOROTHIAZIDE 25 MG PO TABS: 25.0000 mg | ORAL_TABLET | Freq: Every day | ORAL | 1 refills | Status: DC

## 2019-07-23 NOTE — Addendum Note (Signed)
Addended by: Earvin Hansen on: 07/23/2019 06:28 PM   Modules accepted: Orders

## 2019-07-23 NOTE — Patient Instructions (Signed)
Medication Instructions:  INCREASE YOUR HYDROCHLOROTHIAZIDE TO 25 MG DAILY   *If you need a refill on your cardiac medications before your next appointment, please call your pharmacy*  Lab Work: HAVE YOUR PRIMARY CARE FAX THEM WHEN YOU HAVE DONE NEXT WEE  If you have labs (blood work) drawn today and your tests are completely normal, you will receive your results only by: Marland Kitchen MyChart Message (if you have MyChart) OR . A paper copy in the mail If you have any lab test that is abnormal or we need to change your treatment, we will call you to review the results.  Testing/Procedures: NONE   Follow-Up: At [facility], you and your health needs are our priority.  As part of our continuing mission to provide you with exceptional heart care, we have created designated Provider Care Teams.  These Care Teams include your primary Cardiologist (physician) and Advanced Practice Providers (APPs -  Physician Assistants and Nurse Practitioners) who all work together to provide you with the care you need, when you need it.  Your next appointment:   2 month(s)  The format for your next appointment:   Virtual Visit   Provider:   You may see DR [facility]  or one of the following Advanced Practice Providers on your designated Care Team:    Kerin Ransom, PA-C  [place], [place]  Coletta Memos, [place]

## 2019-07-23 NOTE — Progress Notes (Signed)
Cardiology Office Note   Date:  07/23/2019   ID:  Laura Bush, DOB January 08, 1951, MRN [id number]  PCP:  Darreld Mclean, MD  Cardiologist:   Skeet Latch, MD  Nephrologist: Dr. Trinda Pascal [facility] office)  No chief complaint on file.   History of Present Illness: Laura Bush is a 68 y.o. female with breast cancer s/p lumpectomy and XRT, GERD, hypertension and hyperlipidemia who presents for follow up.  She was seen 04/2016 with a report of progressive exertional dyspnea. It was especially prominent when walking up an incline. She was referred for an exercise Myoview that was negative for ischemia and revealed LVEF 71%.  She also noted that her heart rate only gets into the 90s with exertion.  Metoprolol was switched to carvedilol. She followed up with our pharmacist in her blood pressure was well-controlled. However amlodipine was discontinued due to lower extremity edema.    [name] has been doing well.  She switched to a plant-based diet in September 2020.  She has enjoyed using new recipes.  She is also walking almost daily.  She has no exertional chest pain or shortness of breath.  She has noted some mild lower extremity edema at the end of the day that improves with elevation of her legs.  In the past she used compression stockings which did seem to help.  She has no orthopnea or PND.  Her blood pressure was very well-controlled but has been in the 269S to 854O systolic over the last 2 months.  She has not made any other changes to her medications or diet.   Past Medical History:  Diagnosis Date  . Asthma    in cold weather  . Breast cancer ([place]) 09/18/14   left breast  . Breast cancer ([place]) 10/07/14   bx left breast  . Breast cancer of upper-outer quadrant of left female breast ([place]) 09/22/2014  . Cluster headaches    in her 40's  . GERD (gastroesophageal reflux disease)   . Heart murmur    as a child (has outgrown)  . High cholesterol   . Hypertension    . Microscopic colitis   . OSA on CPAP   . Personal history of radiation therapy   . Pneumonia 2000's X 1  . PONV (postoperative nausea and vomiting)   . Rosacea   . S/P radiation therapy 12/30/14-01/27/15   left breast 50Gy total dose  . 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 Venous insufficiency     Past Surgical History:  Procedure Laterality Date  . ABDOMINAL HYSTERECTOMY  1999  . APPENDECTOMY  ~ 2006  . BREAST BIOPSY Left 10/2014  . BREAST LUMPECTOMY Left 2016  . BREAST REDUCTION SURGERY Bilateral 11/11/2014   Procedure: Bilateral Breast Reduction;  Surgeon: Crissie Reese, MD;  Location: [facility];  Service: Plastics;  Laterality: Bilateral;  . CARPAL TUNNEL RELEASE Bilateral    2 surgeries on right, 1 on left  . CESAREAN SECTION  1978; 1980  . COLONOSCOPY    . FOREARM FRACTURE SURGERY Right 2003 X 3  . KNEE ARTHROSCOPY Bilateral    meniscus repair  . PARTIAL MASTECTOMY WITH NEEDLE LOCALIZATION AND AXILLARY SENTINEL LYMPH NODE BX Left 11/11/2014   Procedure: LEFT BREAST PARTIAL MASTECTOMY WITH NEEDLE LOCALIZATION TIMES TWO AND LEFT AXILLARY SENTINEL LYMPH NODE Biopsy;  Surgeon: Fanny Skates, MD;  Location: [facility];  Service: General;  Laterality: Left;  . SQUAMOUS CELL CARCINOMA EXCISION  1980's X 1   "  nose"  . TONSILLECTOMY  ~ 1957  . TUBAL LIGATION  ?1984     Current Outpatient Medications  Medication Sig Dispense Refill  . aMILoride (MIDAMOR) 5 MG tablet Take 1 tablet (5 mg total) by mouth daily. 90 tablet 3  . B Complex-C (B-COMPLEX WITH VITAMIN C) tablet Take 1 tablet by mouth daily.    . calcium-vitamin D (OSCAL WITH D) 250-125 MG-UNIT tablet Take 1 tablet by mouth 2 (two) times daily. 325mg  calcium, D3 (500)IU    . carvedilol (COREG) 12.5 MG tablet Take 1 tablet (12.5 mg total) by mouth 2 (two) times daily with a meal. 180 tablet 3  . Fexofenadine HCl (ALLEGRA ALLERGY PO) Take by mouth daily.    Marland Kitchen gabapentin (NEURONTIN) 300 MG capsule Take 1 capsule (300 mg total) by  mouth at bedtime. 90 capsule 3  . pantoprazole (PROTONIX) 40 MG tablet Take 1 tablet (40 mg total) by mouth daily. 90 tablet 3  . simvastatin (ZOCOR) 20 MG tablet Take 1 tablet (20 mg total) by mouth daily. 90 tablet 3  . tamoxifen (NOLVADEX) 20 MG tablet Take 1 tablet (20 mg total) by mouth daily. 90 tablet 3  . telmisartan (MICARDIS) 80 MG tablet Take 1 tablet (80 mg total) by mouth daily. 90 tablet 3  . hydrochlorothiazide (HYDRODIURIL) 25 MG tablet Take 1 tablet (25 mg total) by mouth daily. 90 tablet 1   No current facility-administered medications for this visit.    Allergies:   Amlodipine, Lanolin, and Tape    Social History:  The patient  reports that she has never smoked. She has never used smokeless tobacco. She reports current alcohol use of about 5.0 standard drinks of alcohol per week. She reports that she does not use drugs.   Family History:  The patient's family history includes Dementia in her mother; Heart attack in her maternal grandmother; Heart failure in her father; Hyperlipidemia in her mother; Hypertension in her maternal grandfather and mother; Stroke in her father and paternal grandfather; Uterine cancer (age of onset: 28) in her sister.    ROS:  Please see the history of present illness.   Otherwise, review of systems are positive for cold hands and feet.   All other systems are reviewed and negative.    PHYSICAL EXAM: VS:  BP 133/73   Pulse 62   Temp (!) 97.1 F (36.2 C)   Ht 4\' 11"  (1.499 m)   Wt 194 lb 3.2 oz (88.1 kg)   SpO2 97%   BMI 39.22 kg/m  , BMI Body mass index is 39.22 kg/m. GENERAL:  Well appearing HEENT: Pupils equal round and reactive, fundi not visualized, oral mucosa unremarkable NECK:  No jugular venous distention, waveform within normal limits, carotid upstroke brisk and symmetric, no bruits LUNGS:  Clear to auscultation bilaterally HEART:  RRR.  PMI not displaced or sustained,S1 and S2 within normal limits, no S3, no S4, no clicks, no  rubs, no murmurs ABD:  Flat, positive bowel sounds normal in frequency in pitch, no bruits, no rebound, no guarding, no midline pulsatile mass, no hepatomegaly, no splenomegaly EXT:  2 plus pulses throughout, trace edema, no cyanosis no clubbing SKIN:  No rashes no nodules NEURO:  Cranial nerves II through XII grossly intact, motor grossly intact throughout PSYCH:  Cognitively intact, oriented to person place and time   EKG:  EKG is ordered today. The ekg ordered 05/03/16 demonstrates sinus rhythm.  Rate 67 bpm.  L axis deviation.  Absent R wave  progression 07/12/17: Sinus bradycardia.  Rate 56 bpm.  LAFB.  Incomplete right bundle branch block.  Poor R wave progression. 07/17/18: Sinus rhythm.  Rate 62 bpm.  LAFB.  Incomplete left bundle branch block. 07/23/19:  Sinus rhythm.  RaTE 62 bpm.  LAFB.  Exercise Myoview 05/11/16:   The left ventricular ejection fraction is hyperdynamic (>65%).  Nuclear stress EF: 71%.  Blood pressure demonstrated a normal response to exercise.  There was no ST segment deviation noted during stress.  The study is normal.   Normal stress nuclear study with no ischemia or infarction; EF 71 with normal wall motion.    Recent Labs: 04/03/2019: ALT 23; BUN 15; Creatinine, Ser 0.85; Hemoglobin 12.8; Platelets 157.0; Potassium 4.1; Sodium 143    Lipid Panel    Component Value Date/Time   CHOL 158 04/03/2019 1037   TRIG 169.0 (H) 04/03/2019 1037   HDL 49.60 04/03/2019 1037   CHOLHDL 3 04/03/2019 1037   VLDL 33.8 04/03/2019 1037   LDLCALC 75 04/03/2019 1037      Wt Readings from Last 3 Encounters:  07/23/19 194 lb 3.2 oz (88.1 kg)  06/28/19 195 lb 6.4 oz (88.6 kg)  05/13/19 190 lb (86.2 kg)      ASSESSMENT AND PLAN:  # Exertional dyspnea:  Resolved.  Symptoms have improved with training and by switching metoprolol to carvedilol.  She was congratulated on her diet and exercise.   # Hypertension: Blood pressure was mildly elevated here but has  been higher at home.  Her machine is accurate.  Continue and amiloride, carvedilol, and telmisartan.  We will increase HCTZ to 25 mg.  She is scheduled to have labs drawn for her wellness check next week.  I have asked her to have these forwarded to [place].  # Hyperlipidemia: Continue simvastatin.  LDL 75 on 03/2019.   Current medicines are reviewed at length with the patient today.  The patient does not have concerns regarding medicines.  The following changes have been made: Increase HCTZ  Labs/ tests ordered today include:   Orders Placed This Encounter  Procedures  . EKG 12-Lead     Disposition:   FU with Christino Mcglinchey C. Oval Linsey, MD, [facility] in 2 months    Signed, Tamim Skog C. Oval Linsey, MD, [facility]  07/23/2019 4:54 PM    [place]

## 2019-07-25 ENCOUNTER — Encounter: Payer: Self-pay | Admitting: Family Medicine

## 2019-07-25 DIAGNOSIS — I10 Essential (primary) hypertension: Secondary | ICD-10-CM

## 2019-07-26 ENCOUNTER — Encounter: Payer: Self-pay | Admitting: Family Medicine

## 2019-07-26 ENCOUNTER — Other Ambulatory Visit: Payer: Self-pay | Admitting: *Deleted

## 2019-07-26 MED ORDER — HYDROCHLOROTHIAZIDE 25 MG PO TABS: 25.0000 mg | ORAL_TABLET | Freq: Every day | ORAL | 1 refills | Status: DC

## 2019-07-26 NOTE — Telephone Encounter (Signed)
Spoke with Holland Falling and was told patient needed PA for HCTZ 25 mg daily. Discussed with patient and she will just get at [facility] and use good Rx if more than $7

## 2019-07-26 NOTE — Progress Notes (Signed)
Virtual Visit via Video Note  I connected with patient on 07/29/19 at 10:00 AM EST by audio enabled telemedicine application and verified that I am speaking with the correct person using two identifiers.   THIS ENCOUNTER IS A VIRTUAL VISIT DUE TO COVID-19 - PATIENT WAS NOT SEEN IN THE OFFICE. PATIENT HAS CONSENTED TO VIRTUAL VISIT / TELEMEDICINE VISIT   Location of patient: home  Location of provider: office  I discussed the limitations of evaluation and management by telemedicine and the availability of in person appointments. The patient expressed understanding and agreed to proceed.   Subjective:   Laura Bush is a 68 y.o. female who presents for Medicare Annual (Subsequent) preventive examination.  Review of Systems:  Home Safety/Smoke Alarms: Feels safe in home. Smoke alarms in place.  Lives w/ husband in 1 story home.   Female:      Mammo- 09/03/18     Dexa scan-   07/26/18     CCS- Cologuard 04/05/17-neg    Objective:     Vitals: BP 124/74 Comment: pt reported    Advanced Directives 07/29/2019 07/26/2018 05/30/2016 01/25/2016 04/29/2015 03/02/2015 12/15/2014  Does Patient Have a Medical Advance Directive? Yes Yes No No No Yes Yes  Type of Paramedic of [place];Living will [place];Living will - - - Special educational needs teacher of [place];Living will  Does patient want to make changes to medical advance directive? No - Patient declined - - - - No - Patient declined No - Patient declined  Copy of [place] in Chart? Yes - validated most recent copy scanned in chart (See row information) Yes - validated most recent copy scanned in chart (See row information) - - - - -  Would patient like information on creating a medical advance directive? - - - - - - -    Tobacco Social History   Tobacco Use  Smoking Status Never Smoker  Smokeless Tobacco Never Used     Counseling given: Not  Answered   Clinical Intake: Pain : No/denies pain      Past Medical History:  Diagnosis Date  . Asthma    in cold weather  . Breast cancer ([facility]) 09/18/14   left breast  . Breast cancer ([facility]) 10/07/14   bx left breast  . Breast cancer of upper-outer quadrant of left female breast ([facility]) 09/22/2014  . Cluster headaches    in her 40's  . GERD (gastroesophageal reflux disease)   . Heart murmur    as a child (has outgrown)  . High cholesterol   . Hypertension   . Microscopic colitis   . OSA on CPAP   . Personal history of radiation therapy   . Pneumonia 2000's X 1  . PONV (postoperative nausea and vomiting)   . Rosacea   . S/P radiation therapy 12/30/14-01/27/15   left breast 50Gy total dose  . 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 Venous insufficiency    Past Surgical History:  Procedure Laterality Date  . ABDOMINAL HYSTERECTOMY  1999  . APPENDECTOMY  ~ 2006  . BREAST BIOPSY Left 10/2014  . BREAST LUMPECTOMY Left 2016  . BREAST REDUCTION SURGERY Bilateral 11/11/2014   Procedure: Bilateral Breast Reduction;  Surgeon: Crissie Reese, MD;  Location: [facility];  Service: Plastics;  Laterality: Bilateral;  . CARPAL TUNNEL RELEASE Bilateral    2 surgeries on right, 1 on left  . CESAREAN SECTION  1978; 1980  . COLONOSCOPY    .  FOREARM FRACTURE SURGERY Right 2003 X 3  . KNEE ARTHROSCOPY Bilateral    meniscus repair  . PARTIAL MASTECTOMY WITH NEEDLE LOCALIZATION AND AXILLARY SENTINEL LYMPH NODE BX Left 11/11/2014   Procedure: LEFT BREAST PARTIAL MASTECTOMY WITH NEEDLE LOCALIZATION TIMES TWO AND LEFT AXILLARY SENTINEL LYMPH NODE Biopsy;  Surgeon: Fanny Skates, MD;  Location: [facility];  Service: General;  Laterality: Left;  . SQUAMOUS CELL CARCINOMA EXCISION  1980's X 1   "nose"  . TONSILLECTOMY  ~ 1957  . TUBAL LIGATION  ?1984   Family History  Problem Relation Age of Onset  . Uterine cancer Sister 56  . Stroke Father        deceased  . Heart failure Father   . Dementia Mother         Lives in [place]  . Hypertension Mother   . Hyperlipidemia Mother   . Heart attack Maternal Grandmother   . Hypertension Maternal Grandfather   . Stroke Paternal Grandfather   . Colon cancer Neg Hx   . Esophageal cancer Neg Hx   . Pancreatic cancer Neg Hx   . Stomach cancer Neg Hx   . Liver disease Neg Hx    Social History   Socioeconomic History  . Marital status: Married    Spouse name: Not on file  . Number of children: Not on file  . Years of education: Not on file  . Highest education level: Not on file  Occupational History  . Occupation: retired  Tobacco Use  . Smoking status: Never Smoker  . Smokeless tobacco: Never Used  Substance and Sexual Activity  . Alcohol use: Yes    Alcohol/week: 5.0 standard drinks    Types: 5 Glasses of wine per week  . Drug use: No  . Sexual activity: Not Currently  Other Topics Concern  . Not on file  Social History Narrative   From [place] [employer].   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   2 children, boy and girl   Twin grandchildren on the way.   Quilting, sewing, reading.   Social Determinants of Health   Financial Resource Strain:   . Difficulty of Paying Living Expenses: Not on file  Food Insecurity:   . Worried About Charity fundraiser in the Last Year: Not on file  . Ran Out of Food in the Last Year: Not on file  Transportation Needs:   . Lack of Transportation (Medical): Not on file  . Lack of Transportation (Non-Medical): Not on file  Physical Activity:   . Days of Exercise per Week: Not on file  . Minutes of Exercise per Session: Not on file  Stress:   . Feeling of Stress : Not on file  Social Connections:   . Frequency of Communication with Friends and Family: Not on file  . Frequency of Social Gatherings with Friends and Family: Not on file  . Attends Religious Services: Not on file  . Active Member of Clubs or Organizations: Not on file  . Attends Archivist  Meetings: Not on file  . Marital Status: Not on file    Outpatient Encounter Medications as of 07/29/2019  Medication Sig  . aMILoride (MIDAMOR) 5 MG tablet Take 1 tablet (5 mg total) by mouth daily.  . B Complex-C (B-COMPLEX WITH VITAMIN C) tablet Take 1 tablet by mouth daily.  . calcium-vitamin D (OSCAL WITH D) 250-125 MG-UNIT tablet Take 1 tablet by mouth 2 (two) times daily. 325mg   calcium, D3 (500)IU  . carvedilol (COREG) 12.5 MG tablet Take 1 tablet (12.5 mg total) by mouth 2 (two) times daily with a meal.  . Fexofenadine HCl (ALLEGRA ALLERGY PO) Take by mouth daily.  Marland Kitchen gabapentin (NEURONTIN) 300 MG capsule Take 1 capsule (300 mg total) by mouth at bedtime.  . hydrochlorothiazide (HYDRODIURIL) 25 MG tablet Take 1 tablet (25 mg total) by mouth daily.  . pantoprazole (PROTONIX) 40 MG tablet Take 1 tablet (40 mg total) by mouth daily.  . simvastatin (ZOCOR) 20 MG tablet Take 1 tablet (20 mg total) by mouth daily.  . tamoxifen (NOLVADEX) 20 MG tablet Take 1 tablet (20 mg total) by mouth daily.  Marland Kitchen telmisartan (MICARDIS) 80 MG tablet Take 1 tablet (80 mg total) by mouth daily.  . [DISCONTINUED] hydrochlorothiazide (HYDRODIURIL) 25 MG tablet Take 1 tablet (25 mg total) by mouth daily.   No facility-administered encounter medications on file as of 07/29/2019.    Activities of Daily Living In your present state of health, do you have any difficulty performing the following activities: 07/29/2019  Hearing? N  Vision? N  Difficulty concentrating or making decisions? N  Walking or climbing stairs? N  Dressing or bathing? N  Doing errands, shopping? N  Preparing Food and eating ? N  Using the Toilet? N  In the past six months, have you accidently leaked urine? N  Do you have problems with loss of bowel control? N  Managing your Medications? N  Managing your Finances? N  Housekeeping or managing your Housekeeping? N  Some recent data might be hidden    Patient Care Team: Copland,  Gay Filler, MD as PCP - General (Family Medicine) Skeet Latch, MD as Attending Physician (Cardiology)    Assessment:   This is a routine wellness examination for Naureen. Physical assessment deferred to PCP.  Exercise Activities and Dietary recommendations Current Exercise Habits: Home exercise routine, Type of exercise: yoga, Time (Minutes): 30, Frequency (Times/Week): 2, Weekly Exercise (Minutes/Week): 60, Intensity: Mild, Exercise limited by: None identified Diet (meal preparation, eat out, water intake, caffeinated beverages, dairy products, fruits and vegetables): eats a plant based diet    Goals    . Increase physical activity       Fall Risk Fall Risk  07/26/2018 03/02/2015 12/15/2014  Falls in the past year? 0 No No     Depression Screen PHQ 2/9 Scores 07/29/2019 07/26/2018  PHQ - 2 Score 0 0     Cognitive Function Ad8 score reviewed for issues:  Issues making decisions:no  Less interest in hobbies / activities:no  Repeats questions, stories (family complaining):no  Trouble using ordinary gadgets (microwave, computer, phone):no  Forgets the month or year: no  Mismanaging finances: no  Remembering appts:no  Daily problems with thinking and/or memory:no Ad8 score is=0         Immunization History  Administered Date(s) Administered  . Fluad Quad(high Dose 65+) 04/03/2019  . Hepatitis A 08/24/1995, 12/23/1996  . Hepatitis B 12/23/1996, 02/14/1997, 05/24/1997, 06/04/1998  . IPV 08/24/1995  . Influenza Split 04/06/2017  . Influenza, High Dose Seasonal PF 05/10/2016, 04/06/2017  . Influenza,inj,Quad PF,6+ Mos 04/30/2015  . Influenza-Unspecified 07/07/2005, 06/08/2006, 05/28/2007, 04/23/2008, 05/23/2011, 05/14/2012, 05/20/2013, 04/12/2018  . Meningococcal Conjugate 05/07/2008  . Meningococcal Mcv4o 05/07/2008  . Pneumococcal Conjugate-13 03/22/2017  . Pneumococcal Polysaccharide-23 06/08/2006, 03/28/2018  . Tdap 09/19/2011  . Typhoid Inactivated  05/07/2008  . Yellow Fever 05/08/2008  . Zoster 06/21/2013  . Zoster Recombinat (Shingrix) 10/06/2018, 01/18/2019    Screening  Tests Health Maintenance  Topic Date Due  . Fecal DNA (Cologuard)  04/05/2020  . MAMMOGRAM  09/03/2020  . TETANUS/TDAP  09/18/2021  . INFLUENZA VACCINE  Completed  . DEXA SCAN  Completed  . Hepatitis C Screening  Completed  . PNA vac Low Risk Adult  Completed      Plan:   See you next year!  Continue to eat heart healthy diet (full of fruits, vegetables, whole grains, lean protein, water--limit salt, fat, and sugar intake) and increase physical activity as tolerated.  Continue doing brain stimulating activities (puzzles, reading, adult coloring books, staying active) to keep memory sharp.    I have personally reviewed and noted the following in the patient's chart:   . Medical and social history . Use of alcohol, tobacco or illicit drugs  . Current medications and supplements . Functional ability and status . Nutritional status . Physical activity . Advanced directives . List of other physicians . Hospitalizations, surgeries, and ER visits in previous 12 months . Vitals . Screenings to include cognitive, depression, and falls . Referrals and appointments  In addition, I have reviewed and discussed with patient certain preventive protocols, quality metrics, and best practice recommendations. A written personalized care plan for preventive services as well as general preventive health recommendations were provided to patient.     Shela Nevin, [place]  07/29/2019

## 2019-07-29 ENCOUNTER — Other Ambulatory Visit: Payer: Self-pay

## 2019-07-29 ENCOUNTER — Ambulatory Visit (INDEPENDENT_AMBULATORY_CARE_PROVIDER_SITE_OTHER): Payer: Medicare HMO | Admitting: *Deleted

## 2019-07-29 ENCOUNTER — Encounter: Payer: Self-pay | Admitting: *Deleted

## 2019-07-29 ENCOUNTER — Other Ambulatory Visit: Payer: Self-pay | Admitting: Oncology

## 2019-07-29 VITALS — BP 124/74

## 2019-07-29 DIAGNOSIS — Z9889 Other specified postprocedural states: Secondary | ICD-10-CM

## 2019-07-29 DIAGNOSIS — Z0000 Encounter for general adult medical examination without abnormal findings: Secondary | ICD-10-CM | POA: Diagnosis not present

## 2019-07-29 NOTE — Patient Instructions (Addendum)
See you next year!  Continue to eat heart healthy diet (full of fruits, vegetables, whole grains, lean protein, water--limit salt, fat, and sugar intake) and increase physical activity as tolerated.  Continue doing brain stimulating activities (puzzles, reading, adult coloring books, staying active) to keep memory sharp.    Laura Bush , Thank you for taking time to come for your Medicare Wellness Visit. I appreciate your ongoing commitment to your health goals. Please review the following plan we discussed and let me know if I can assist you in the future.   These are the goals we discussed: Goals    . Increase physical activity       This is a list of the screening recommended for you and due dates:  Health Maintenance  Topic Date Due  . Cologuard (Stool DNA test)  04/05/2020  . Mammogram  09/03/2020  . Tetanus Vaccine  09/18/2021  . Flu Shot  Completed  . DEXA scan (bone density measurement)  Completed  .  Hepatitis C: One time screening is recommended by Center for Disease Control  (CDC) for  adults born from [birth year] through 1965.   Completed  . Pneumonia vaccines  Completed    Preventive Care 42 Years and Older, Female Preventive care refers to lifestyle choices and visits with your health care provider that can promote health and wellness. This includes:  A yearly physical exam. This is also called an annual well check.  Regular dental and eye exams.  Immunizations.  Screening for certain conditions.  Healthy lifestyle choices, such as diet and exercise. What can I expect for my preventive care visit? Physical exam Your health care provider will check:  Height and weight. These may be used to calculate body mass index (BMI), which is a measurement that tells if you are at a healthy weight.  Heart rate and blood pressure.  Your skin for abnormal spots. Counseling Your health care provider may ask you questions about:  Alcohol, tobacco, and drug use.  Emotional  well-being.  Home and relationship well-being.  Sexual activity.  Eating habits.  History of falls.  Memory and ability to understand (cognition).  Work and work Statistician.  Pregnancy and menstrual history. What immunizations do I need?  Influenza (flu) vaccine  This is recommended every year. Tetanus, diphtheria, and pertussis (Tdap) vaccine  You may need a Td booster every 10 years. Varicella (chickenpox) vaccine  You may need this vaccine if you have not already been vaccinated. Zoster (shingles) vaccine  You may need this after age 59. Pneumococcal conjugate (PCV13) vaccine  One dose is recommended after age 20. Pneumococcal polysaccharide (PPSV23) vaccine  One dose is recommended after age 39. Measles, mumps, and rubella (MMR) vaccine  You may need at least one dose of MMR if you were born in 1957 or later. You may also need a second dose. Meningococcal conjugate (MenACWY) vaccine  You may need this if you have certain conditions. Hepatitis A vaccine  You may need this if you have certain conditions or if you travel or work in places where you may be exposed to hepatitis A. Hepatitis B vaccine  You may need this if you have certain conditions or if you travel or work in places where you may be exposed to hepatitis B. Haemophilus influenzae type b (Hib) vaccine  You may need this if you have certain conditions. You may receive vaccines as individual doses or as more than one vaccine together in one shot (combination vaccines). Talk with  your health care provider about the risks and benefits of combination vaccines. What tests do I need? Blood tests  Lipid and cholesterol levels. These may be checked every 5 years, or more frequently depending on your overall health.  Hepatitis C test.  Hepatitis B test. Screening  Lung cancer screening. You may have this screening every year starting at age 69 if you have a 30-pack-year history of smoking and  currently smoke or have quit within the past 15 years.  Colorectal cancer screening. All adults should have this screening starting at age 25 and continuing until age 82. Your health care provider may recommend screening at age 76 if you are at increased risk. You will have tests every 1-10 years, depending on your results and the type of screening test.  Diabetes screening. This is done by checking your blood sugar (glucose) after you have not eaten for a while (fasting). You may have this done every 1-3 years.  Mammogram. This may be done every 1-2 years. Talk with your health care provider about how often you should have regular mammograms.  BRCA-related cancer screening. This may be done if you have a family history of breast, ovarian, tubal, or peritoneal cancers. Other tests  Sexually transmitted disease (STD) testing.  Bone density scan. This is done to screen for osteoporosis. You may have this done starting at age 44. Follow these instructions at home: Eating and drinking  Eat a diet that includes fresh fruits and vegetables, whole grains, lean protein, and low-fat dairy products. Limit your intake of foods with high amounts of sugar, saturated fats, and salt.  Take vitamin and mineral supplements as recommended by your health care provider.  Do not drink alcohol if your health care provider tells you not to drink.  If you drink alcohol: ? Limit how much you have to 0-1 drink a day. ? Be aware of how much alcohol is in your drink. In the [place] one drink equals one 12 oz bottle of beer (355 mL), one 5 oz glass of wine (148 mL), or one 1 oz glass of hard liquor (44 mL). Lifestyle  Take daily care of your teeth and gums.  Stay active. Exercise for at least 30 minutes on 5 or more days each week.  Do not use any products that contain nicotine or tobacco, such as cigarettes, e-cigarettes, and chewing tobacco. If you need help quitting, ask your health care provider.  If you are  sexually active, practice safe sex. Use a condom or other form of protection in order to prevent STIs (sexually transmitted infections).  Talk with your health care provider about taking a low-dose aspirin or statin. What's next?  Go to your health care provider once a year for a well check visit.  Ask your health care provider how often you should have your eyes and teeth checked.  Stay up to date on all vaccines. This information is not intended to replace advice given to you by your health care provider. Make sure you discuss any questions you have with your health care provider. Document Released: 08/21/2015 Document Revised: 07/19/2018 Document Reviewed: 07/19/2018 Elsevier Patient Education  2020 [employer].

## 2019-08-05 DIAGNOSIS — K219 Gastro-esophageal reflux disease without esophagitis: Secondary | ICD-10-CM | POA: Diagnosis not present

## 2019-08-05 DIAGNOSIS — I10 Essential (primary) hypertension: Secondary | ICD-10-CM | POA: Diagnosis not present

## 2019-08-05 DIAGNOSIS — Z888 Allergy status to other drugs, medicaments and biological substances status: Secondary | ICD-10-CM | POA: Diagnosis not present

## 2019-08-05 DIAGNOSIS — C50919 Malignant neoplasm of unspecified site of unspecified female breast: Secondary | ICD-10-CM | POA: Diagnosis not present

## 2019-08-05 DIAGNOSIS — R32 Unspecified urinary incontinence: Secondary | ICD-10-CM | POA: Diagnosis not present

## 2019-08-05 DIAGNOSIS — R232 Flushing: Secondary | ICD-10-CM | POA: Diagnosis not present

## 2019-08-05 DIAGNOSIS — Z79810 Long term (current) use of selective estrogen receptor modulators (SERMs): Secondary | ICD-10-CM | POA: Diagnosis not present

## 2019-08-05 DIAGNOSIS — E785 Hyperlipidemia, unspecified: Secondary | ICD-10-CM | POA: Diagnosis not present

## 2019-08-05 DIAGNOSIS — Z85828 Personal history of other malignant neoplasm of skin: Secondary | ICD-10-CM | POA: Diagnosis not present

## 2019-08-13 ENCOUNTER — Other Ambulatory Visit (INDEPENDENT_AMBULATORY_CARE_PROVIDER_SITE_OTHER): Payer: Medicare HMO

## 2019-08-13 ENCOUNTER — Encounter: Payer: Self-pay | Admitting: Family Medicine

## 2019-08-13 ENCOUNTER — Other Ambulatory Visit: Payer: Self-pay

## 2019-08-13 DIAGNOSIS — I10 Essential (primary) hypertension: Secondary | ICD-10-CM | POA: Diagnosis not present

## 2019-08-13 LAB — BASIC METABOLIC PANEL
BUN: 13 mg/dL (ref 6–23)
CO2: 29 mEq/L (ref 19–32)
Calcium: 9.5 mg/dL (ref 8.4–10.5)
Chloride: 102 mEq/L (ref 96–112)
Creatinine, Ser: 0.89 mg/dL (ref 0.40–1.20)
GFR: 63 mL/min (ref >60.00)
Glucose, Bld: 141 mg/dL — ABNORMAL HIGH (ref 70–99)
Potassium: 3.7 mEq/L (ref 3.5–5.1)
Sodium: 141 mEq/L (ref 135–145)

## 2019-08-13 LAB — TSH: TSH: 1.55 u[IU]/mL (ref 0.35–4.50)

## 2019-08-13 NOTE — Progress Notes (Signed)
Received her labs, message to pt  Results for orders placed or performed in visit on 08/13/19  TSH  Result Value Ref Range   TSH 1.55 0.35 - 4.50 uIU/mL  Basic metabolic panel  Result Value Ref Range   Sodium 141 135 - 145 mEq/L   Potassium 3.7 3.5 - 5.1 mEq/L   Chloride 102 96 - 112 mEq/L   CO2 29 19 - 32 mEq/L   Glucose, Bld 141 (H) 70 - 99 mg/dL   BUN 13 6 - 23 mg/dL   Creatinine, Ser 0.89 0.40 - 1.20 mg/dL   GFR 63.00 >60.00 mL/min   Calcium 9.5 8.4 - 10.5 mg/dL

## 2019-08-22 DIAGNOSIS — R69 Illness, unspecified: Secondary | ICD-10-CM | POA: Diagnosis not present

## 2019-09-06 ENCOUNTER — Ambulatory Visit
Admission: RE | Admit: 2019-09-06 | Discharge: 2019-09-06 | Disposition: A | Discharge status: Home / Self Care | Payer: Medicare HMO | Source: Ambulatory Visit | Attending: Oncology | Admitting: Oncology

## 2019-09-06 ENCOUNTER — Other Ambulatory Visit: Payer: Self-pay

## 2019-09-06 ENCOUNTER — Ambulatory Visit: Payer: Medicare HMO

## 2019-09-06 DIAGNOSIS — Z9889 Other specified postprocedural states: Secondary | ICD-10-CM

## 2019-09-06 DIAGNOSIS — R928 Other abnormal and inconclusive findings on diagnostic imaging of breast: Secondary | ICD-10-CM | POA: Diagnosis not present

## 2019-09-13 ENCOUNTER — Ambulatory Visit: Payer: Medicare HMO | Attending: Internal Medicine

## 2019-09-13 DIAGNOSIS — Z23 Encounter for immunization: Secondary | ICD-10-CM | POA: Insufficient documentation

## 2019-09-13 NOTE — Progress Notes (Signed)
   Covid-19 Vaccination Clinic  Name:  Laura Bush    MRN: [id number] DOB: 07/14/1951  09/13/2019  [name] was observed post Covid-19 immunization for 15 minutes without incidence. She was provided with Vaccine Information Sheet and instruction to access the V-Safe system.   [name] was instructed to call 911 with any severe reactions post vaccine: Marland Kitchen Difficulty breathing  . Swelling of your face and throat  . A fast heartbeat  . A bad rash all over your body  . Dizziness and weakness    Immunizations Administered    Name Date Dose VIS Date Route   Pfizer COVID-19 Vaccine 09/13/2019 10:59 AM 0.3 mL 07/19/2019 Intramuscular   Manufacturer: [place]   Lot: [id number]   [place]: [address]-3

## 2019-09-23 ENCOUNTER — Ambulatory Visit: Payer: Medicare HMO

## 2019-09-30 ENCOUNTER — Telehealth (INDEPENDENT_AMBULATORY_CARE_PROVIDER_SITE_OTHER): Payer: Medicare HMO | Admitting: Cardiovascular Disease

## 2019-09-30 ENCOUNTER — Encounter: Payer: Self-pay | Admitting: Cardiovascular Disease

## 2019-09-30 ENCOUNTER — Encounter: Payer: Self-pay | Admitting: *Deleted

## 2019-09-30 VITALS — BP 119/70 | HR 61 | Ht 59.0 in | Wt 200.0 lb

## 2019-09-30 DIAGNOSIS — E7800 Pure hypercholesterolemia, unspecified: Secondary | ICD-10-CM | POA: Diagnosis not present

## 2019-09-30 DIAGNOSIS — I10 Essential (primary) hypertension: Secondary | ICD-10-CM | POA: Diagnosis not present

## 2019-09-30 HISTORY — DX: Morbid (severe) obesity due to excess calories: E66.01

## 2019-09-30 NOTE — Patient Instructions (Addendum)
Medication Instructions:  Your physician recommends that you continue on your current medications as directed. Please refer to the Current Medication list given to you today.  *If you need a refill on your cardiac medications before your next appointment, please call your pharmacy*  Lab Work: NONE  Testing/Procedures: NONE   Follow-Up: At [facility], you and your health needs are our priority.  As part of our continuing mission to provide you with exceptional heart care, we have created designated Provider Care Teams.  These Care Teams include your primary Cardiologist (physician) and Advanced Practice Providers (APPs -  Physician Assistants and Nurse Practitioners) who all work together to provide you with the care you need, when you need it.  Your next appointment:   12 month(s) You will receive a reminder letter in the mail two months in advance. If you don't receive a letter, please call our office to schedule the follow-up appointment.   The format for your next appointment:   In Person  Provider:   You may see [facility] or one of the following Advanced Practice Providers on your designated Care Team:    Kerin Ransom, PA-C  [place], [place]  Coletta Memos, [place]   You will be scheduled for a consult with the [facility] If you do not hear from the office in 1 week please call us at [phone]  [email or web address]

## 2019-09-30 NOTE — Progress Notes (Signed)
[facility] [place], [place], [place] [address] [phone]  Date:  10/02/2019   Name:  Laura Bush   DOB:  October 02, 1950   MRN:  [id number]  PCP:  Darreld Mclean, MD    Chief Complaint: Hypertension (6 month follow up, colonoscopy? ), Vitamin D deficiency, and Ankle Pain (left arch/ankle pain)   History of Present Illness:  Laura Bush is a 69 y.o. very pleasant female patient who presents with the following:  Here today for 9-month follow-up visit History of breast cancer diagnosed 2016, hypertension, GERD, vitamin D deficiency Last seen by myself in August 2020  Mammogram January 2021 DEXA scan December 2019 Immunizations up-to-date including Shingrix and COVID-19  Complete lab work done in August, vitamin D normal at that time BMP done last month, normal renal function  Amloride 5mg  daily Carvedilol 12.5 twice daily Gabapentin 300 at bedtime HCTZ 25 Simvastatin 20 Telmisartan 80  Status post hysterectomy She has 2 grandchildren, twins who live locally, they are turning 5 this spring.  2 more young grandchildren live in [place]  Her gastroenterologist is Dr. Henrene Pastor, she saw him for reflux in November She would like to do a colonoscopy this time for her screening- she will contact him for her next screening, due in August She saw her cardiologist, Dr. Oval Linsey, in December for dyspnea. She also sees neurology for sleep apnea  She is having some left foot pain at the instep/arch of the foot.  It does not seem related to activity, it is not worse in the morning.  She generally wears supportive athletic type shoes She describes the pain as a stinging or shocking sensation Notes this for about 3 months The right foot is ok No injury  Her left ankle tends to swell quite a bit- this is baseline for her.  However, she does feel like her swelling is gradually worsening.  Her right  ankle swells as well, but not quite as badly.  She has tried some prescription compression hose in the past, could not tolerate.  She also tried some over-the-counter compression socks which were more comfortable but did not seem to help a whole lot Patient Active Problem List   Diagnosis Date Noted  . Morbid obesity ([place]) 09/30/2019  . Osteopenia 10/02/2018  . Malignant 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, estrogen receptor positive ([place]) 05/29/2017  . 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 Vitamin D deficiency 01/29/2016  . Hot flashes due to tamoxifen 10/05/2015  . Diarrhea 05/27/2015  . Eczema 12/29/2014  . Venous insufficiency 10/16/2014  . Essential hypertension 10/01/2014  . Hypercholesteremia 10/01/2014  . GERD (gastroesophageal reflux disease) 10/01/2014    Past Medical History:  Diagnosis Date  . Asthma    in cold weather  . Breast cancer ([place]) 09/18/14   left breast  . Breast cancer ([place]) 10/07/14   bx left breast  . Breast cancer of upper-outer quadrant of left female breast ([place]) 09/22/2014  . Cluster headaches    in her 40's  . GERD (gastroesophageal reflux disease)   . Heart murmur    as a child (has outgrown)  . High cholesterol   . Hypertension   . Microscopic colitis   . Morbid obesity ([place]) 09/30/2019  . OSA on CPAP   . Personal history of radiation therapy   . Pneumonia 2000's X 1  . PONV (postoperative nausea  and vomiting)   . Rosacea   . S/P radiation therapy 12/30/14-01/27/15   left breast 50Gy total dose  . 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 Venous insufficiency     Past Surgical History:  Procedure Laterality Date  . ABDOMINAL HYSTERECTOMY  1999  . APPENDECTOMY  ~ 2006  . BREAST BIOPSY Left 10/2014  . BREAST LUMPECTOMY Left 2016  . BREAST REDUCTION SURGERY Bilateral 11/11/2014   Procedure: Bilateral Breast Reduction;  Surgeon: Crissie Reese, MD;  Location: [facility];  Service: Plastics;  Laterality:  Bilateral;  . CARPAL TUNNEL RELEASE Bilateral    2 surgeries on right, 1 on left  . CESAREAN SECTION  1978; 1980  . COLONOSCOPY    . FOREARM FRACTURE SURGERY Right 2003 X 3  . KNEE ARTHROSCOPY Bilateral    meniscus repair  . PARTIAL MASTECTOMY WITH NEEDLE LOCALIZATION AND AXILLARY SENTINEL LYMPH NODE BX Left 11/11/2014   Procedure: LEFT BREAST PARTIAL MASTECTOMY WITH NEEDLE LOCALIZATION TIMES TWO AND LEFT AXILLARY SENTINEL LYMPH NODE Biopsy;  Surgeon: Fanny Skates, MD;  Location: [facility];  Service: General;  Laterality: Left;  . SQUAMOUS CELL CARCINOMA EXCISION  1980's X 1   "nose"  . TONSILLECTOMY  ~ 1957  . TUBAL LIGATION  ?1984    Social History   Tobacco Use  . Smoking status: Never Smoker  . Smokeless tobacco: Never Used  Substance Use Topics  . Alcohol use: Yes    Alcohol/week: 5.0 standard drinks    Types: 5 Glasses of wine per week  . Drug use: No    Family History  Problem Relation Age of Onset  . Uterine cancer Sister 44  . Stroke Father        deceased  . Heart failure Father   . Dementia Mother        Lives in [place]  . Hypertension Mother   . Hyperlipidemia Mother   . Heart attack Maternal Grandmother   . Hypertension Maternal Grandfather   . Stroke Paternal Grandfather   . Colon cancer Neg Hx   . Esophageal cancer Neg Hx   . Pancreatic cancer Neg Hx   . Stomach cancer Neg Hx   . Liver disease Neg Hx     Allergies  Allergen Reactions  . Amlodipine Swelling  . Lanolin   . Tape Other (See Comments)    Redness (Bandaids also)    Medication list has been reviewed and updated.  Current Outpatient Medications on File Prior to Visit  Medication Sig Dispense Refill  . aMILoride (MIDAMOR) 5 MG tablet Take 1 tablet (5 mg total) by mouth daily. 90 tablet 3  . B Complex-C (B-COMPLEX WITH VITAMIN C) tablet Take 1 tablet by mouth daily.    . calcium-vitamin D (OSCAL WITH D) 250-125 MG-UNIT tablet Take 1 tablet by mouth 2 (two) times daily. 325mg  calcium,  D3 (500)IU    . carvedilol (COREG) 12.5 MG tablet Take 1 tablet (12.5 mg total) by mouth 2 (two) times daily with a meal. 180 tablet 3  . Fexofenadine HCl (ALLEGRA ALLERGY PO) Take by mouth daily.    Marland Kitchen gabapentin (NEURONTIN) 300 MG capsule Take 1 capsule (300 mg total) by mouth at bedtime. 90 capsule 3  . hydrochlorothiazide (HYDRODIURIL) 25 MG tablet Take 1 tablet (25 mg total) by mouth daily. 90 tablet 1  . pantoprazole (PROTONIX) 40 MG tablet Take 1 tablet (40 mg total) by mouth daily. 90 tablet 3  . simvastatin (ZOCOR) 20 MG tablet Take 1  tablet (20 mg total) by mouth daily. 90 tablet 3  . tamoxifen (NOLVADEX) 20 MG tablet Take 1 tablet (20 mg total) by mouth daily. 90 tablet 3  . telmisartan (MICARDIS) 80 MG tablet Take 1 tablet (80 mg total) by mouth daily. 90 tablet 3   No current facility-administered medications on file prior to visit.    Review of Systems:  As per HPI- otherwise negative. No fever or chills  Patient is frustrated by continued weight gain She also notes thinning hair and cold intolerance  Physical Examination: Vitals:   10/02/19 1003  BP: 126/70  Pulse: 65  Resp: 16  Temp: (!) 97 F (36.1 C)  SpO2: 98%   Vitals:   10/02/19 1003  Weight: 199 lb (90.3 kg)  Height: 4\' 11"  (1.499 m)   Body mass index is 40.19 kg/m. Ideal Body Weight: Weight in (lb) to have BMI = 25: 123.5  GEN: no acute distress.  Obese, otherwise looks well HEENT: Atraumatic, Normocephalic.  Ears and Nose: No external deformity. CV: RRR, No M/G/R. No JVD. No thrill. No extra heart sounds. PULM: CTA B, no wheezes, crackles, rhonchi. No retractions. No resp. distress. No accessory muscle use. ABD: S, NT, ND, +BS. No rebound. No HSM. EXTR: No c/c/e PSYCH: Normally interactive. Conversant.  She has diffusely enlarged ankles and calves bilaterally, left somewhat worse.  Appears to be edema, does not easily pit.  There is no weeping or skin breakdown Feet are warm and well perfused  with strong pedal pulses No calf tenderness or heat Patient has flat feet bilaterally, difficult to examine ankle due to pronounced swelling over joint bilaterally   Assessment and Plan: Mixed hyperlipidemia - Plan: Lipid panel  Essential hypertension - Plan: aMILoride (MIDAMOR) 5 MG tablet  Ductal carcinoma in situ (DCIS) of right breast  Vitamin D deficiency  Bilateral leg edema - Plan: VAS [place] LOWER EXTREMITY VENOUS REFLUX  Left foot pain - Plan: Ambulatory referral to Orthopedic Surgery  Cold intolerance - Plan: TSH, Ferritin  Weight gain - Plan: TSH  In person visit today for a couple of concerns She is seeing Dr. Jana Hakim tomorrow, I ordered some lab work-if possible, these labs could be drawn with her oncology labs tomorrow. We will repeat thyroid, and also check ferritin She has changed her diet and would like to check a lipid panel  Patient notes persistent bilateral lower extremity swelling, seems to be getting gradually worse Suspect this is due to venous insufficiency.  Order venous reflux study Referral to foot and ankle surgeon for evaluation-I am concerned that her foot breakdown and swelling, other anatomical issues may cause her more debility as she gets older. Suggested a trial of compression socks every day for 1 week, and elevation of legs for 20 to 30 minutes midday  Moderate medical decision making today This visit occurred during the SARS-CoV-2 public health emergency.  Safety protocols were in place, including screening questions prior to the visit, additional usage of staff PPE, and extensive cleaning of exam room while observing appropriate contact time as indicated for disinfecting solutions.    Signed Lamar Blinks, MD

## 2019-09-30 NOTE — Progress Notes (Signed)
Virtual Visit via Video Note   This visit type was conducted due to national recommendations for restrictions regarding the COVID-19 Pandemic (e.g. social distancing) in an effort to limit this patient's exposure and mitigate transmission in our community.  Due to her co-morbid illnesses, this patient is at least at moderate risk for complications without adequate follow up.  This format is felt to be most appropriate for this patient at this time.  The patient did not have access to video technology/had technical difficulties with video requiring transitioning to audio format only (telephone).  All issues noted in this document were discussed and addressed.  No physical exam could be performed with this format.  Please refer to the patient's chart for her  consent to telehealth for [facility].   Date:  09/30/2019   ID:  Laura Bush, DOB 20-Oct-1950, MRN [id number]  Patient Location: Home Provider Location: Home  PCP:  Copland, Gay Filler, MD  Cardiologist:  No primary care provider on file.  Electrophysiologist:  None   Evaluation Performed:  Follow-Up Visit  Chief Complaint:  Weight gain  History of Present Illness:    Laura Bush is a 69 y.o. female breast cancer s/p lumpectomy and XRT, GERD, hypertension and hyperlipidemia who presents for follow up.  She was seen 04/2016 with a report of progressive exertional dyspnea. It was especially prominent when walking up an incline. She was referred for an exercise Myoview that was negative for ischemia and revealed LVEF 71%.  She also noted that her heart rate only gets into the 90s with exertion.  Metoprolol was switched to carvedilol. She followed up with our pharmacist in her blood pressure was well-controlled. However amlodipine was discontinued due to lower extremity edema.    [name] switched to a plant-based diet in September 2020.  She has enjoyed using new recipes.    She also increased her exercise.  At her last  appointment Ms. Zettel blood pressure was poorly controlled. Hydrochlorothiazide was increased. Overall her blood pressure has been better controlled.   She still has occasional spikes to the 140s.  Her swelling has improved.  She is discouraged because she continues to gain weight despite exercising more and switching to a plant-based diet.  She denies any orthopnea or PND and if anything her breathing continues to improve.  She has no chest pain or pressure.  In the last couple weeks she is exercise less due to the cold weather.  The patient does not have symptoms concerning for COVID-19 infection (fever, chills, cough, or new shortness of breath).    Past Medical History:  Diagnosis Date  . Asthma    in cold weather  . Breast cancer ([place]) 09/18/14   left breast  . Breast cancer ([place]) 10/07/14   bx left breast  . Breast cancer of upper-outer quadrant of left female breast ([facility]) 09/22/2014  . Cluster headaches    in her 40's  . GERD (gastroesophageal reflux disease)   . Heart murmur    as a child (has outgrown)  . High cholesterol   . Hypertension   . Microscopic colitis   . Morbid obesity ([facility] [facility]) 09/30/2019  . OSA on CPAP   . Personal history of radiation therapy   . Pneumonia 2000's X 1  . PONV (postoperative nausea and vomiting)   . Rosacea   . S/P radiation therapy 12/30/14-01/27/15   left breast 50Gy total dose  . 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 Venous insufficiency  Past Surgical History:  Procedure Laterality Date  . ABDOMINAL HYSTERECTOMY  1999  . APPENDECTOMY  ~ 2006  . BREAST BIOPSY Left 10/2014  . BREAST LUMPECTOMY Left 2016  . BREAST REDUCTION SURGERY Bilateral 11/11/2014   Procedure: Bilateral Breast Reduction;  Surgeon: Crissie Reese, MD;  Location: [facility];  Service: Plastics;  Laterality: Bilateral;  . CARPAL TUNNEL RELEASE Bilateral    2 surgeries on right, 1 on left  . CESAREAN SECTION  1978; 1980  . COLONOSCOPY    . FOREARM FRACTURE SURGERY Right 2003 X 3   . KNEE ARTHROSCOPY Bilateral    meniscus repair  . PARTIAL MASTECTOMY WITH NEEDLE LOCALIZATION AND AXILLARY SENTINEL LYMPH NODE BX Left 11/11/2014   Procedure: LEFT BREAST PARTIAL MASTECTOMY WITH NEEDLE LOCALIZATION TIMES TWO AND LEFT AXILLARY SENTINEL LYMPH NODE Biopsy;  Surgeon: Fanny Skates, MD;  Location: [facility];  Service: General;  Laterality: Left;  . SQUAMOUS CELL CARCINOMA EXCISION  1980's X 1   "nose"  . TONSILLECTOMY  ~ 1957  . TUBAL LIGATION  ?1984     Current Meds  Medication Sig  . aMILoride (MIDAMOR) 5 MG tablet Take 1 tablet (5 mg total) by mouth daily.  . B Complex-C (B-COMPLEX WITH VITAMIN C) tablet Take 1 tablet by mouth daily.  . calcium-vitamin D (OSCAL WITH D) 250-125 MG-UNIT tablet Take 1 tablet by mouth 2 (two) times daily. 325mg  calcium, D3 (500)IU  . carvedilol (COREG) 12.5 MG tablet Take 1 tablet (12.5 mg total) by mouth 2 (two) times daily with a meal.  . Fexofenadine HCl (ALLEGRA ALLERGY PO) Take by mouth daily.  Marland Kitchen gabapentin (NEURONTIN) 300 MG capsule Take 1 capsule (300 mg total) by mouth at bedtime.  . hydrochlorothiazide (HYDRODIURIL) 25 MG tablet Take 1 tablet (25 mg total) by mouth daily.  . pantoprazole (PROTONIX) 40 MG tablet Take 1 tablet (40 mg total) by mouth daily.  . simvastatin (ZOCOR) 20 MG tablet Take 1 tablet (20 mg total) by mouth daily.  . tamoxifen (NOLVADEX) 20 MG tablet Take 1 tablet (20 mg total) by mouth daily.  Marland Kitchen telmisartan (MICARDIS) 80 MG tablet Take 1 tablet (80 mg total) by mouth daily.     Allergies:   Amlodipine, Lanolin, and Tape   Social History   Tobacco Use  . Smoking status: Never Smoker  . Smokeless tobacco: Never Used  Substance Use Topics  . Alcohol use: Yes    Alcohol/week: 5.0 standard drinks    Types: 5 Glasses of wine per week  . Drug use: No     Family Hx: The patient's family history includes Dementia in her mother; Heart attack in her maternal grandmother; Heart failure in her father; Hyperlipidemia in  her mother; Hypertension in her maternal grandfather and mother; Stroke in her father and paternal grandfather; Uterine cancer (age of onset: 60) in her sister. There is no history of Colon cancer, Esophageal cancer, Pancreatic cancer, Stomach cancer, or Liver disease.  ROS:   Please see the history of present illness.     All other systems reviewed and are negative.   Prior CV studies:   The following studies were reviewed today:  Exercise Myoview 05/11/16:   The left ventricular ejection fraction is hyperdynamic (>65%).  Nuclear stress EF: 71%.  Blood pressure demonstrated a normal response to exercise.  There was no ST segment deviation noted during stress.  The study is normal.  Normal stress nuclear study with no ischemia or infarction; EF 71 with normal wall motion.  Unfortunately urine had to ask them for loaner something do not have loaner computers I will have it ready I will have the  Labs/Other Tests and Data Reviewed:    EKG:  No ECG reviewed.  Recent Labs: 04/03/2019: ALT 23; Hemoglobin 12.8; Platelets 157.0 08/13/2019: BUN 13; Creatinine, Ser 0.89; Potassium 3.7; Sodium 141; TSH 1.55   Recent Lipid Panel Lab Results  Component Value Date/Time   CHOL 158 04/03/2019 10:37 AM   TRIG 169.0 (H) 04/03/2019 10:37 AM   HDL 49.60 04/03/2019 10:37 AM   CHOLHDL 3 04/03/2019 10:37 AM   LDLCALC 75 04/03/2019 10:37 AM    Wt Readings from Last 3 Encounters:  09/30/19 200 lb (90.7 kg)  07/23/19 194 lb 3.2 oz (88.1 kg)  06/28/19 195 lb 6.4 oz (88.6 kg)     Objective:    Vital Signs:  BP 119/70   Pulse 61   Ht 4\' 11"  (1.499 m)   Wt 200 lb (90.7 kg)   BMI 40.40 kg/m    VITAL SIGNS:  reviewed GEN:  no acute distress EYES:  sclerae anicteric, EOMI - Extraocular Movements Intact RESPIRATORY:  normal respiratory effort, symmetric expansion CARDIOVASCULAR:  no peripheral edema SKIN:  no rash, lesions or ulcers. MUSCULOSKELETAL:  no obvious deformities. NEURO:   alert and oriented x 3, no obvious focal deficit PSYCH:  normal affect  ASSESSMENT & PLAN:    # Morbid obesity: [name] continues to gain weight despite switching to a plant-based diet and exercising more.  She is quite discouraged.  Her thyroid function was normal.  We will plan to refer her to the healthy weight and wellness clinic.  # Exertional dyspnea:  Resolved.  Symptoms have improved with training and by switching metoprolol to carvedilol.  She was congratulated on her diet and exercise.   # Hypertension: Blood pressure is better controlled.  Continue amiloride, carvedilol, HCTZ and telmisartan.  # Hyperlipidemia: Continue simvastatin.  LDL 75 on 03/2019.  COVID-19 Education: The signs and symptoms of COVID-19 were discussed with the patient and how to seek care for testing (follow up with PCP or arrange E-visit).  The importance of social distancing was discussed today.  Time:   Today, I have spent 15 minutes with the patient with telehealth technology discussing the above problems.     Medication Adjustments/Labs and Tests Ordered: Current medicines are reviewed at length with the patient today.  Concerns regarding medicines are outlined above.   Tests Ordered: No orders of the defined types were placed in this encounter.   Medication Changes: No orders of the defined types were placed in this encounter.   Follow Up:  In Person in 1 year(s)  Signed, Skeet Latch, MD  09/30/2019 8:51 AM    [place]

## 2019-09-30 NOTE — Addendum Note (Signed)
Addended by: Alvina Filbert B on: 09/30/2019 10:29 AM   Modules accepted: Orders

## 2019-10-02 ENCOUNTER — Encounter: Payer: Self-pay | Admitting: Family Medicine

## 2019-10-02 ENCOUNTER — Ambulatory Visit (INDEPENDENT_AMBULATORY_CARE_PROVIDER_SITE_OTHER): Payer: Medicare HMO | Admitting: Family Medicine

## 2019-10-02 ENCOUNTER — Other Ambulatory Visit: Payer: Self-pay | Admitting: *Deleted

## 2019-10-02 ENCOUNTER — Other Ambulatory Visit: Payer: Self-pay

## 2019-10-02 VITALS — BP 126/70 | HR 65 | Temp 97.0°F | Resp 16 | Ht 59.0 in | Wt 199.0 lb

## 2019-10-02 DIAGNOSIS — R6889 Other general symptoms and signs: Secondary | ICD-10-CM | POA: Diagnosis not present

## 2019-10-02 DIAGNOSIS — E782 Mixed hyperlipidemia: Secondary | ICD-10-CM | POA: Diagnosis not present

## 2019-10-02 DIAGNOSIS — R600 Localized edema: Secondary | ICD-10-CM | POA: Diagnosis not present

## 2019-10-02 DIAGNOSIS — D0511 Intraductal carcinoma in situ of right breast: Secondary | ICD-10-CM | POA: Diagnosis not present

## 2019-10-02 DIAGNOSIS — M79672 Pain in left foot: Secondary | ICD-10-CM | POA: Diagnosis not present

## 2019-10-02 DIAGNOSIS — I10 Essential (primary) hypertension: Secondary | ICD-10-CM | POA: Diagnosis not present

## 2019-10-02 DIAGNOSIS — C50812 Malignant neoplasm of overlapping sites of left female breast: Secondary | ICD-10-CM

## 2019-10-02 DIAGNOSIS — R635 Abnormal weight gain: Secondary | ICD-10-CM

## 2019-10-02 DIAGNOSIS — E559 Vitamin D deficiency, unspecified: Secondary | ICD-10-CM | POA: Diagnosis not present

## 2019-10-02 MED ORDER — AMILORIDE HCL 5 MG PO TABS: 5.0000 mg | ORAL_TABLET | Freq: Every day | ORAL | 3 refills | Status: DC

## 2019-10-02 NOTE — Patient Instructions (Addendum)
I ordered a lipid panel for you, as well as an iron level and thyroid.  Please ask Dr. Virgie Dad office if they can draw these labs for you tomorrow  I am going to set you up to see one of the foot and ankle specialists to help [place] with your foot symptoms.  Please let me know if this is getting worse  Experiment with wearing compression socks daily for about a week, also try laying down elevating your legs for 20 to 30 minutes midday.  Please let me know if this is helpful  I will set you up for a venous study of your legs, to check how well your blood is circulating

## 2019-10-02 NOTE — Progress Notes (Signed)
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  She continues on tamoxifen.  She tolerates this remarkably well.  She has some hot flashes but they are occasional.  She does not have vaginal wetness issues.  She wonders if some of the leg swelling that she has is related to the medication but that seems to me unlikely.  She is having some hair thinning and that possibly is related to the tamoxifen.  Since her last visit, she underwent breast MRI on 03/20/2019 showing: breast composition B; no evidence of malignancy in either breast; fat necrosis with some associated enhancement, similar to previous studies.  She also underwent bilateral diagnostic mammography with tomography at [facility] on 09/06/2019 showing: breast density category B; no evidence of malignancy in either breast; patient can now return to annual screening mammograms.   REVIEW OF SYSTEMS: Laura Bush tells me she is going to be working with our vein and vascular specialists about the problem of swelling in her lower extremities.  She has gained some weight and she is going to be going through the: Program which I fear is producing very good results even though it is on the expensive side.  She exercises regularly by walking and doing yoga.  She will receive her second Pfizer vaccine dose within the next week or 2.  A detailed review of systems today was otherwise stable   BREAST CANCER HISTORY: From the original intake note:  "Laura Bush"  recently  moved to the [place] area from [place]. While still there, she had routine screening mammography with tomography 02/14/2014, which showed only a tiny breast cysts. Six-month follow-up exam was performed 08/25/2014, and now there was evidence of a possible focal asymmetry in the left breast. On 08/25/2014 at the [facility] radiology she underwent a unilateral diagnostic mammography with tomography , and this confirmed a spiculated mass in the left breast superiorly. Sonography of this area found a poorly defined hypoechoic mass measuring 0.7 cm at the 11:00 position 8 cm from the nipple. There was also an adjacent cluster of cysts with a benign circumcised appearance.   Biopsy of the mass in question was recommended, but as the patient was moving to this area, this was performed here on 09/18/2014. The pathology from that procedure (SAA [id number]) found an invasive ductal carcinoma, grade 2, estrogen receptor 100% positive, with strong staining intensity, progesterone receptor 43% positive, with strong staining intensity, with an MIB-1 of 30%, and no HER-2 amplification.   On 09/26/2014 the patient underwent bilateral breast MRI at [facility], showing a breast density category B. The right breast was unremarkable and there were no lymph nodes of concern. In the upper inner quadrant of the left breast there was an enhancing mass measuring 1.2 cm. In addition there was a 5.5 cm area of linear enhancement extending anteriorly from this mass. 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  The patient's subsequent history is as detailed below   PAST MEDICAL HISTORY: Past Medical History:  Diagnosis Date  . Asthma  in cold weather  . Breast cancer ([place]) 09/18/14   left breast  . Breast cancer ([place]) 10/07/14   bx left breast  . Breast cancer of upper-outer quadrant of left female breast ([place]) 09/22/2014  . Cluster headaches    in her 40's  . GERD  (gastroesophageal reflux disease)   . Heart murmur    as a child (has outgrown)  . High cholesterol   . Hypertension   . Microscopic colitis   . Morbid obesity ([place]) 09/30/2019  . OSA on CPAP   . Personal history of radiation therapy   . Pneumonia 2000's X 1  . PONV (postoperative nausea and vomiting)   . Rosacea   . S/P radiation therapy 12/30/14-01/27/15   left breast 50Gy total dose  . 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 Venous insufficiency     PAST SURGICAL HISTORY: Past Surgical History:  Procedure Laterality Date  . ABDOMINAL HYSTERECTOMY  1999  . APPENDECTOMY  ~ 2006  . BREAST BIOPSY Left 10/2014  . BREAST LUMPECTOMY Left 2016  . BREAST REDUCTION SURGERY Bilateral 11/11/2014   Procedure: Bilateral Breast Reduction;  Surgeon: Crissie Reese, MD;  Location: [facility];  Service: Plastics;  Laterality: Bilateral;  . CARPAL TUNNEL RELEASE Bilateral    2 surgeries on right, 1 on left  . CESAREAN SECTION  1978; 1980  . COLONOSCOPY    . FOREARM FRACTURE SURGERY Right 2003 X 3  . KNEE ARTHROSCOPY Bilateral    meniscus repair  . PARTIAL MASTECTOMY WITH NEEDLE LOCALIZATION AND AXILLARY SENTINEL LYMPH NODE BX Left 11/11/2014   Procedure: LEFT BREAST PARTIAL MASTECTOMY WITH NEEDLE LOCALIZATION TIMES TWO AND LEFT AXILLARY SENTINEL LYMPH NODE Biopsy;  Surgeon: Fanny Skates, MD;  Location: [facility];  Service: General;  Laterality: Left;  . SQUAMOUS CELL CARCINOMA EXCISION  1980's X 1   "nose"  . TONSILLECTOMY  ~ 1957  . TUBAL LIGATION  ?1984    FAMILY HISTORY: Family History  Problem Relation Age of Onset  . Uterine cancer Sister 6  . Stroke Father        deceased  . Heart failure Father   . Dementia Mother        Lives in [place]  . Hypertension Mother   . Hyperlipidemia Mother   . Heart attack Maternal Grandmother   . Hypertension Maternal Grandfather   . Stroke Paternal Grandfather   . Colon cancer Neg Hx   . Esophageal cancer Neg Hx   . Pancreatic cancer Neg Hx   .  Stomach cancer Neg Hx   . Liver disease Neg Hx    The patient's father died at the age of 34 following a stroke. The patient's mother is still living, age 11. GEN had no brothers, one sister. That sister was diagnosed with uterine cancer at the age of 45. There is no other history of breast or ovarian cancer in the family to the patient's knowledge   GYNECOLOGIC HISTORY:  No LMP recorded. Patient has had a hysterectomy.  menarche age 71, first live birth age 31, the patient is [place] P2.  She underwent total abdominal hysterectomy with bilateral salpingo-oophorectomy in 1999. She took hormone replacement for approximately 4 years. She was also all oral contraceptives  remotelyfor about 8 years with no complications   SOCIAL HISTORY: (Updated February 2021) Laura Bush used to work for an [employer], she is currently retired from being a Pensions consultant. Her husband, Louie Casa still works for an Orthoptist. Their daughter Estill Bamberg  Goins lives in [place] where she works as an Web designer. Their son Robert Bush lives in [place] where he works as a Education officer, museum. Robert's twins will turn years old May 2021, one boy and one girl. She has 2 additional grandchildren in [place].  ADVANCED DIRECTIVES: In the absence of any documents to the contrary the patient's husband is her healthcare power of attorney  HEALTH MAINTENANCE: Social History   Tobacco Use  . Smoking status: Never Smoker  . Smokeless tobacco: Never Used  Substance Use Topics  . Alcohol use: Yes    Alcohol/week: 5.0 standard drinks    Types: 5 Glasses of wine per week  . Drug use: No    Colonoscopy:  2008  PAP:  Bone density: 2007/"normal"  Lipid panel:  Allergies  Allergen Reactions  . Amlodipine Swelling  . Lanolin   . Tape Other (See Comments)    Redness (Bandaids also)    Current Outpatient Medications  Medication Sig Dispense Refill  . aMILoride (MIDAMOR) 5 MG tablet Take 1 tablet (5 mg  total) by mouth daily. 90 tablet 3  . B Complex-C (B-COMPLEX WITH VITAMIN C) tablet Take 1 tablet by mouth daily.    . calcium-vitamin D (OSCAL WITH D) 250-125 MG-UNIT tablet Take 1 tablet by mouth 2 (two) times daily. 392m calcium, D3 (500)IU    . carvedilol (COREG) 12.5 MG tablet Take 1 tablet (12.5 mg total) by mouth 2 (two) times daily with a meal. 180 tablet 3  . Fexofenadine HCl (ALLEGRA ALLERGY PO) Take by mouth daily.    .Marland Kitchengabapentin (NEURONTIN) 300 MG capsule Take 1 capsule (300 mg total) by mouth at bedtime. 90 capsule 3  . hydrochlorothiazide (HYDRODIURIL) 25 MG tablet Take 1 tablet (25 mg total) by mouth daily. 90 tablet 1  . pantoprazole (PROTONIX) 40 MG tablet Take 1 tablet (40 mg total) by mouth daily. 90 tablet 3  . simvastatin (ZOCOR) 20 MG tablet Take 1 tablet (20 mg total) by mouth daily. 90 tablet 3  . tamoxifen (NOLVADEX) 20 MG tablet Take 1 tablet (20 mg total) by mouth daily. 90 tablet 3  . telmisartan (MICARDIS) 80 MG tablet Take 1 tablet (80 mg total) by mouth daily. 90 tablet 3   No current facility-administered medications for this visit.    OBJECTIVE:  Middle-aged white woman in no acute distress  Vitals:   10/03/19 1333  BP: 140/66  Pulse: 62  Resp: 20  Temp: 98.2 F (36.8 C)  SpO2: 98%     Body mass index is 40.58 kg/m.    ECOG FS:0 - Asymptomatic  Sclerae unicteric, EOMs intact Wearing a mask No cervical or supraclavicular adenopathy Lungs no rales or rhonchi Heart regular rate and rhythm Abd soft, nontender, positive bowel sounds MSK no focal spinal tenderness, no upper extremity lymphedema Neuro: nonfocal, well oriented, appropriate affect Breasts: The right breast is status post reduction mammoplasty with no skin or nipple changes of concern.  The left breast is status post lumpectomy and radiation.  There is no evidence of local recurrence.  Both axillae are benign.   LAB RESULTS:  CMP     Component Value Date/Time   NA 145 10/03/2019  1311   NA 142 05/29/2017 1002   K 3.8 10/03/2019 1311   K 3.9 05/29/2017 1002   CL 107 10/03/2019 1311   CO2 25 10/03/2019 1311   CO2 28 05/29/2017 1002   GLUCOSE 101 (H) 10/03/2019 1311   GLUCOSE 86 05/29/2017 1002  BUN 16 10/03/2019 1311   BUN 13.5 05/29/2017 1002   CREATININE 0.85 10/03/2019 1311   CREATININE 0.8 05/29/2017 1002   CALCIUM 9.0 10/03/2019 1311   CALCIUM 9.4 05/29/2017 1002   PROT 6.6 10/03/2019 1311   PROT 6.8 05/29/2017 1002   ALBUMIN 3.8 10/03/2019 1311   ALBUMIN 3.7 05/29/2017 1002   AST 25 10/03/2019 1311   AST 18 05/29/2017 1002   ALT 36 10/03/2019 1311   ALT 19 05/29/2017 1002   ALKPHOS 39 10/03/2019 1311   ALKPHOS 39 (L) 05/29/2017 1002   BILITOT 0.4 10/03/2019 1311   BILITOT 0.48 05/29/2017 1002   GFRNONAA >60 10/03/2019 1311   GFRAA >60 10/03/2019 1311    INo results found for: SPEP, UPEP  Lab Results  Component Value Date   WBC 4.7 10/03/2019   NEUTROABS 2.5 10/03/2019   HGB 11.8 (L) 10/03/2019   HCT 35.0 (L) 10/03/2019   MCV 97.8 10/03/2019   PLT 154 10/03/2019      Chemistry      Component Value Date/Time   NA 145 10/03/2019 1311   NA 142 05/29/2017 1002   K 3.8 10/03/2019 1311   K 3.9 05/29/2017 1002   CL 107 10/03/2019 1311   CO2 25 10/03/2019 1311   CO2 28 05/29/2017 1002   BUN 16 10/03/2019 1311   BUN 13.5 05/29/2017 1002   CREATININE 0.85 10/03/2019 1311   CREATININE 0.8 05/29/2017 1002      Component Value Date/Time   CALCIUM 9.0 10/03/2019 1311   CALCIUM 9.4 05/29/2017 1002   ALKPHOS 39 10/03/2019 1311   ALKPHOS 39 (L) 05/29/2017 1002   AST 25 10/03/2019 1311   AST 18 05/29/2017 1002   ALT 36 10/03/2019 1311   ALT 19 05/29/2017 1002   BILITOT 0.4 10/03/2019 1311   BILITOT 0.48 05/29/2017 1002      No results found for: LABCA2  No components found for: LABCA125  No results for input(s): INR in the last 168 hours.  Urinalysis    Component Value Date/Time   COLORURINE YELLOW 11/05/2014 1142    APPEARANCEUR CLEAR 11/05/2014 1142   LABSPEC 1.008 11/05/2014 1142   PHURINE 7.0 11/05/2014 1142   GLUCOSEU NEGATIVE 11/05/2014 1142   HGBUR NEGATIVE 11/05/2014 1142   BILIRUBINUR NEGATIVE 11/05/2014 1142   KETONESUR NEGATIVE 11/05/2014 1142   PROTEINUR NEGATIVE 11/05/2014 1142   UROBILINOGEN 0.2 11/05/2014 1142   NITRITE NEGATIVE 11/05/2014 1142   LEUKOCYTESUR NEGATIVE 11/05/2014 1142    STUDIES: MM DIAG BREAST TOMO BILATERAL  Result Date: 09/06/2019 CLINICAL DATA:  History of LEFT breast cancer in 2016 status post breast conservation surgery. Status post bilateral breast reduction mammoplasties. DCIS was also identified incidentally in the RIGHT breast at the time of patient's breast reduction surgery. EXAM: DIGITAL DIAGNOSTIC BILATERAL MAMMOGRAM WITH CAD AND TOMO COMPARISON:  Previous exam(s). ACR Breast Density Category b: There are scattered areas of fibroglandular density. FINDINGS: There are stable postsurgical changes within each breast. There are no new dominant masses, suspicious calcifications or secondary signs of malignancy within either breast. Mammographic images were processed with CAD. IMPRESSION: No evidence of malignancy within either breast. Stable postsurgical changes. Per protocol, as patient has completed a 5 year course of postsurgical diagnostic mammography, patient can now return to a routine annual bilateral SCREENING mammogram schedule. RECOMMENDATION: Screening mammogram in one year.(Code:SM-B-01Y) I have discussed the findings and recommendations with the patient. If applicable, a reminder letter will be sent to the patient regarding the next appointment. BI-RADS  CATEGORY  2: Benign. Electronically Signed   By: Franki Cabot M.D.   On: 09/06/2019 10:41    ASSESSMENT: 69 y.o. [place], [place] woman status post left breast upper outer quadrant biopsy 09/18/2014 for a clinical T1 cN0 invasive ductal carcinoma, grade 2, estrogen and progesterone receptor positive, HER-2 not  amplified, with an MIB-1 of 30%  (1) there is a 5.5 cm area of non-masslike enhancement seen in the left breast with biopsy 10/07/2014 showing atypical ductal hyperplasia  (2) status post left lumpectomy and sentinel lymph node sampling 11/11/2014 for a  pT1c  pN0, stage IA invasive ductal carcinoma, grade 3, repeat HER-2 again negative, with negative margins  (3) an Oncotype DX score of 11 predicts an 8% risk of outside the breast recurrence within 10 years if the patient's only systemic therapy is tamoxifen for 5 years. It also predicts no benefit from chemotherapy  (4) Pathology from right reduction mammoplasty 11/11/2014 unexpectedly showed ductal carcinoma in situ, estrogen receptor 100% positive, progesterone receptor 71% positive  (5) adjuvant radiation completed 01/27/2015  (6) anastrozole started June 2016, stopped 05/28/15 due to intolerance  (a) bone density 02/17/2015 was normal with a T score of -0.9  (b) bone density 07/26/2018 shows a T score of -1.3.  (7) tamoxifen started 07/14/15  (8) because of the incomplete resection on the right breast, bilateral yearly breast MRI are recommended, in addition to mammography.  (a) bilateral breast MRI 10/16/2015 was negative  (b) bilateral breast MRI 02/03/2017 showed no suspicious findings  (c) bilateral breast MRI 03/07/2018 showed no evidence of malignancy.  (d) bilateral breast MRI 03/20/2019 again shows benign changes   PLAN: Laura Bush is now close to 5 years out from her definitive surgery.  There is no evidence of disease activity.  This is very favorable.  She will complete 5 years of tamoxifen in December of this year.  I would be comfortable with her stopping at that point.  We have been obtaining yearly MRIs because of the unclear margins asked her DCIS right reduction mammoplasty.  However her breast density now is down to B.  I would suggest that after this years MRI, 5 years out from the surgery, we can go back to yearly  mammography safely.  I will see her again in November of this year and likely that will be her "graduation" visit.  Total encounter time 30 minutes.*  Katiya Fike, Virgie Dad, MD  10/03/19 2:08 PM Medical Oncology and Hematology [facility] [place], [place] [address] Tel. [phone]    Fax. [phone]   I, Wilburn Mylar, am acting as scribe for Dr. Virgie Dad. [place].  I, Lurline Del MD, have reviewed the above documentation for accuracy and completeness, and I agree with the above.    *Total Encounter Time as defined by the Centers for Medicare and Medicaid Services includes, in addition to the face-to-face time of a patient visit (documented in the note above) non-face-to-face time: obtaining and reviewing outside history, ordering and reviewing medications, tests or procedures, care coordination (communications with other health care professionals or caregivers) and documentation in the medical record.

## 2019-10-03 ENCOUNTER — Other Ambulatory Visit: Payer: Self-pay

## 2019-10-03 ENCOUNTER — Telehealth (HOSPITAL_COMMUNITY): Payer: Self-pay

## 2019-10-03 ENCOUNTER — Inpatient Hospital Stay: Payer: Medicare HMO | Attending: Oncology | Admitting: Oncology

## 2019-10-03 ENCOUNTER — Inpatient Hospital Stay: Payer: Medicare HMO

## 2019-10-03 VITALS — BP 140/66 | HR 62 | Temp 98.2°F | Resp 20 | Ht 59.0 in | Wt 200.9 lb

## 2019-10-03 DIAGNOSIS — C50812 Malignant neoplasm of overlapping sites of left female breast: Secondary | ICD-10-CM | POA: Diagnosis not present

## 2019-10-03 DIAGNOSIS — Z923 Personal history of irradiation: Secondary | ICD-10-CM | POA: Diagnosis not present

## 2019-10-03 DIAGNOSIS — E782 Mixed hyperlipidemia: Secondary | ICD-10-CM | POA: Diagnosis not present

## 2019-10-03 DIAGNOSIS — C50912 Malignant neoplasm of unspecified site of left female breast: Secondary | ICD-10-CM | POA: Insufficient documentation

## 2019-10-03 DIAGNOSIS — I10 Essential (primary) hypertension: Secondary | ICD-10-CM | POA: Diagnosis not present

## 2019-10-03 DIAGNOSIS — Z170 Estrogen receptor positive status [ER+]: Secondary | ICD-10-CM | POA: Diagnosis not present

## 2019-10-03 DIAGNOSIS — Z79810 Long term (current) use of selective estrogen receptor modulators (SERMs): Secondary | ICD-10-CM | POA: Diagnosis not present

## 2019-10-03 DIAGNOSIS — R6889 Other general symptoms and signs: Secondary | ICD-10-CM | POA: Diagnosis not present

## 2019-10-03 DIAGNOSIS — Z7689 Persons encountering health services in other specified circumstances: Secondary | ICD-10-CM | POA: Diagnosis not present

## 2019-10-03 DIAGNOSIS — R232 Flushing: Secondary | ICD-10-CM | POA: Insufficient documentation

## 2019-10-03 DIAGNOSIS — D0511 Intraductal carcinoma in situ of right breast: Secondary | ICD-10-CM | POA: Insufficient documentation

## 2019-10-03 DIAGNOSIS — Z79899 Other long term (current) drug therapy: Secondary | ICD-10-CM | POA: Insufficient documentation

## 2019-10-03 DIAGNOSIS — R635 Abnormal weight gain: Secondary | ICD-10-CM | POA: Diagnosis not present

## 2019-10-03 LAB — CMP (CANCER CENTER ONLY)
ALT: 36 U/L (ref 0–44)
AST: 25 U/L (ref 15–41)
Albumin: 3.8 g/dL (ref 3.5–5.0)
Alkaline Phosphatase: 39 U/L (ref 38–126)
Anion gap: 13 (ref 5–15)
BUN: 16 mg/dL (ref 8–23)
CO2: 25 mmol/L (ref 22–32)
Calcium: 9 mg/dL (ref 8.9–10.3)
Chloride: 107 mmol/L (ref 98–111)
Creatinine: 0.85 mg/dL (ref 0.44–1.00)
GFR, Est AFR Am: >60 mL/min (ref >60)
GFR, Estimated: >60 mL/min (ref >60)
Glucose, Bld: 101 mg/dL — ABNORMAL HIGH (ref 70–99)
Potassium: 3.8 mmol/L (ref 3.5–5.1)
Sodium: 145 mmol/L (ref 135–145)
Total Bilirubin: 0.4 mg/dL (ref 0.3–1.2)
Total Protein: 6.6 g/dL (ref 6.5–8.1)

## 2019-10-03 LAB — CBC WITH DIFFERENTIAL (CANCER CENTER ONLY)
Abs Immature Granulocytes: 0.02 10*3/uL (ref 0.00–0.07)
Basophils Absolute: 0 10*3/uL (ref 0.0–0.1)
Basophils Relative: 1 %
Eosinophils Absolute: 0.2 10*3/uL (ref 0.0–0.5)
Eosinophils Relative: 3 %
HCT: 35 % — ABNORMAL LOW (ref 36.0–46.0)
Hemoglobin: 11.8 g/dL — ABNORMAL LOW (ref 12.0–15.0)
Immature Granulocytes: 0 %
Lymphocytes Relative: 31 %
Lymphs Abs: 1.5 10*3/uL (ref 0.7–4.0)
MCH: 33 pg (ref 26.0–34.0)
MCHC: 33.7 g/dL (ref 30.0–36.0)
MCV: 97.8 fL (ref 80.0–100.0)
Monocytes Absolute: 0.5 10*3/uL (ref 0.1–1.0)
Monocytes Relative: 11 %
Neutro Abs: 2.5 10*3/uL (ref 1.7–7.7)
Neutrophils Relative %: 54 %
Platelet Count: 154 10*3/uL (ref 150–400)
RBC: 3.58 MIL/uL — ABNORMAL LOW (ref 3.87–5.11)
RDW: 11.8 % (ref 11.5–15.5)
WBC Count: 4.7 10*3/uL (ref 4.0–10.5)
nRBC: 0 % (ref 0.0–0.2)

## 2019-10-03 LAB — TSH: TSH: 1.29 (ref <=5.90)

## 2019-10-03 MED ORDER — TAMOXIFEN CITRATE 20 MG PO TABS: 20.0000 mg | ORAL_TABLET | Freq: Every day | ORAL | 3 refills | Status: DC

## 2019-10-03 NOTE — Telephone Encounter (Signed)

## 2019-10-04 ENCOUNTER — Encounter: Payer: Self-pay | Admitting: Family Medicine

## 2019-10-04 ENCOUNTER — Telehealth: Payer: Self-pay | Admitting: Oncology

## 2019-10-04 ENCOUNTER — Ambulatory Visit (HOSPITAL_COMMUNITY)
Admission: RE | Admit: 2019-10-04 | Discharge: 2019-10-04 | Disposition: A | Discharge status: Home / Self Care | Payer: Medicare HMO | Source: Ambulatory Visit | Attending: Family Medicine | Admitting: Family Medicine

## 2019-10-04 DIAGNOSIS — R600 Localized edema: Secondary | ICD-10-CM

## 2019-10-04 NOTE — Telephone Encounter (Signed)
I talk with patient regarding schedule  

## 2019-10-07 DIAGNOSIS — M76822 Posterior tibial tendinitis, left leg: Secondary | ICD-10-CM | POA: Diagnosis not present

## 2019-10-08 ENCOUNTER — Ambulatory Visit: Payer: Medicare HMO | Attending: Internal Medicine

## 2019-10-08 ENCOUNTER — Encounter: Payer: Self-pay | Admitting: Family Medicine

## 2019-10-08 DIAGNOSIS — Z23 Encounter for immunization: Secondary | ICD-10-CM | POA: Insufficient documentation

## 2019-10-08 NOTE — Progress Notes (Signed)
   Covid-19 Vaccination Clinic  Name:  Maleea Watson [name]    MRN: [id number] DOB: [birth year]  10/08/2019  Ms. [name] was observed post Covid-19 immunization for 15 minutes without incident. She was provided with Vaccine Information Sheet and instruction to access the V-Safe system.   Ms. [name] was instructed to call 911 with any severe reactions post vaccine: Marland Kitchen Difficulty breathing  . Swelling of face and throat  . A fast heartbeat  . A bad rash all over body  . Dizziness and weakness   Immunizations Administered    Name Date Dose VIS Date Route   Pfizer COVID-19 Vaccine 10/08/2019 11:39 AM 0.3 mL 07/19/2019 Intramuscular   Manufacturer: [place]   Lot: [id number]   [place]: [address]-9

## 2019-10-09 ENCOUNTER — Telehealth: Payer: Self-pay | Admitting: Family Medicine

## 2019-10-09 ENCOUNTER — Other Ambulatory Visit: Payer: Self-pay | Admitting: Family Medicine

## 2019-10-09 ENCOUNTER — Encounter: Payer: Self-pay | Admitting: Family Medicine

## 2019-10-09 DIAGNOSIS — R7989 Other specified abnormal findings of blood chemistry: Secondary | ICD-10-CM

## 2019-10-09 NOTE — Chronic Care Management (AMB) (Signed)
  Chronic Care Management   Note  10/09/2019 Name: Laura Bush MRN: [id number] DOB: 14-Jul-1951  Laura Bush is a 69 y.o. year old female who is a primary care patient of Copland, Gay Filler, MD. I reached out to [name] by phone today in response to a referral sent by Ms. Danise Mina Hiraldo's PCP, Copland, Gay Filler, MD.   [name] was given information about Chronic Care Management services today including:  1. CCM service includes personalized support from designated clinical staff supervised by her physician, including individualized plan of care and coordination with other care providers 2. 24/7 contact phone numbers for assistance for urgent and routine care needs. 3. Service will only be billed when office clinical staff spend 20 minutes or more in a month to coordinate care. 4. Only one practitioner may furnish and bill the service in a calendar month. 5. The patient may stop CCM services at any time (effective at the end of the month) by phone call to the office staff. 6. The patient will be responsible for cost sharing (co-pay) of up to 20% of the service fee (after annual deductible is met).  Patient agreed to services and verbal consent obtained.   Follow up plan:   Raynicia Dukes [employer] Scheduler

## 2019-10-09 NOTE — Progress Notes (Signed)
  Chronic Care Management   Outreach Note  10/09/2019 Name: Sherryn Watson [name] MRN: [id number] DOB: Jun 30, 1951  Referred by: Darreld Mclean, MD Reason for referral : No chief complaint on file.   An unsuccessful telephone outreach was attempted today. The patient was referred to the pharmacist for assistance with care management and care coordination.   Follow Up Plan:   Raynicia Dukes [employer] Scheduler

## 2019-10-14 ENCOUNTER — Ambulatory Visit (INDEPENDENT_AMBULATORY_CARE_PROVIDER_SITE_OTHER): Payer: Medicare HMO | Admitting: Bariatrics

## 2019-10-17 ENCOUNTER — Encounter (INDEPENDENT_AMBULATORY_CARE_PROVIDER_SITE_OTHER): Payer: Self-pay | Admitting: Family Medicine

## 2019-10-17 ENCOUNTER — Ambulatory Visit (INDEPENDENT_AMBULATORY_CARE_PROVIDER_SITE_OTHER): Payer: Medicare HMO | Admitting: Family Medicine

## 2019-10-17 ENCOUNTER — Other Ambulatory Visit: Payer: Self-pay

## 2019-10-17 VITALS — BP 138/79 | HR 57 | Temp 98.6°F | Ht 59.0 in | Wt 196.0 lb

## 2019-10-17 DIAGNOSIS — Z0289 Encounter for other administrative examinations: Secondary | ICD-10-CM

## 2019-10-17 DIAGNOSIS — R5383 Other fatigue: Secondary | ICD-10-CM

## 2019-10-17 DIAGNOSIS — I10 Essential (primary) hypertension: Secondary | ICD-10-CM

## 2019-10-17 DIAGNOSIS — E7849 Other hyperlipidemia: Secondary | ICD-10-CM

## 2019-10-17 DIAGNOSIS — Z6839 Body mass index (BMI) 39.0-39.9, adult: Secondary | ICD-10-CM

## 2019-10-17 DIAGNOSIS — R739 Hyperglycemia, unspecified: Secondary | ICD-10-CM | POA: Diagnosis not present

## 2019-10-17 DIAGNOSIS — R0602 Shortness of breath: Secondary | ICD-10-CM | POA: Diagnosis not present

## 2019-10-17 DIAGNOSIS — G4733 Obstructive sleep apnea (adult) (pediatric): Secondary | ICD-10-CM | POA: Diagnosis not present

## 2019-10-17 DIAGNOSIS — Z1331 Encounter for screening for depression: Secondary | ICD-10-CM

## 2019-10-17 DIAGNOSIS — Z853 Personal history of malignant neoplasm of breast: Secondary | ICD-10-CM

## 2019-10-17 DIAGNOSIS — R7989 Other specified abnormal findings of blood chemistry: Secondary | ICD-10-CM | POA: Diagnosis not present

## 2019-10-17 DIAGNOSIS — L659 Nonscarring hair loss, unspecified: Secondary | ICD-10-CM | POA: Diagnosis not present

## 2019-10-17 NOTE — Progress Notes (Signed)
Dear Dr. Oval Linsey,   Thank you for referring Laura Bush to our clinic. The following note includes my evaluation and treatment recommendations.  Chief Complaint:   OBESITY Laura Bush (MR# [id number]) is a 69 y.o. female who presents for evaluation and treatment of obesity and related comorbidities. Current BMI is Body mass index is 39.59 kg/m. Laura Bush has been struggling with her weight for many years and has been unsuccessful in either losing weight, maintaining weight loss, or reaching her healthy weight goal.  Laura Bush is currently in the action stage of change and ready to dedicate time achieving and maintaining a healthier weight. Laura Bush is interested in becoming our patient and working on intensive lifestyle modifications including (but not limited to) diet and exercise for weight loss.  Laura Bush says she started gaining in the fall of 2020.  She was previously 185.  No med changes. Her hair started thinning last October.  She would like to follow a vegetarian or pescatarian plan.  Laura Bush's habits were reviewed today and are as follows: Her family eats meals together, she thinks her family will eat healthier with her, her desired weight loss is 52 pounds, she started gaining weight over the last 20 years, her heaviest weight ever was 200 pounds, she snacks sometimes in the evenings, she is trying to follow a vegetarian or vegan diet, she is frequently drinking liquids with calories, she frequently eats larger portions than normal and she struggles with emotional eating.  Depression Screen Laura Bush's Food and Mood (modified PHQ-9) score was 5.  Depression screen PHQ 2/9 10/17/2019  Decreased Interest 1  Down, Depressed, Hopeless 1  PHQ - 2 Score 2  Altered sleeping 0  Tired, decreased energy 1  Change in appetite 1  Feeling bad or failure about yourself  0  Trouble concentrating 0  Moving slowly or fidgety/restless 1  Suicidal thoughts 0  PHQ-9 Score 5  Difficult  doing work/chores Not difficult at all   Subjective:   1. Other fatigue Laura Bush admits to daytime somnolence and denies waking up still tired. Patent has a history of symptoms of morning fatigue. Laura Bush generally gets 8 hours of sleep per night, and states that she has generally restful sleep. Snoring is not present. Apneic episodes is not present. Epworth Sleepiness Score is 4.  2. SOB (shortness of breath) on exertion Laura Bush notes increasing shortness of breath with exercising and seems to be worsening over time with weight gain. She notes getting out of breath sooner with activity than she used to. This has gotten worse recently. Laura Bush denies shortness of breath at rest or orthopnea.  3. Essential hypertension Review: taking medications as instructed, no medication side effects noted, no chest pain on exertion, no dyspnea on exertion, no swelling of ankles.  Laura Bush takes telmisartan, HCTZ, carvedilol, and amiloride.  BP Readings from Last 3 Encounters:  10/17/19 138/79  10/03/19 140/66  10/02/19 126/70   4. Other hyperlipidemia Laura Bush has hyperlipidemia and has been trying to improve her cholesterol levels with intensive lifestyle modification including a low saturated fat diet, exercise and weight loss. She denies any chest pain, claudication or myalgias.  She is taking simvastatin.  Lab Results  Component Value Date   ALT 36 10/03/2019   AST 25 10/03/2019   ALKPHOS 39 10/03/2019   BILITOT 0.4 10/03/2019   Lab Results  Component Value Date   CHOL 158 04/03/2019   HDL 49.60 04/03/2019   LDLCALC 75 04/03/2019   TRIG 169.0 (H)  04/03/2019   CHOLHDL 3 04/03/2019   5. OSA (obstructive sleep apnea) Laura Bush has a diagnosis of sleep apnea. She reports that she is using a CPAP regularly.   6. History of breast cancer Laura Bush is taking tamoxifen and gabapentin.  She is almost 5 years out!  7. Hyperglycemia Laura Bush has a history of some elevated blood glucose readings without a  diagnosis of diabetes.  8. Hair loss Laura Bush complains of excess hair loss.  9. Depression screening Laura Bush was screened for depression today as part of her new patient workup.  Assessment/Plan:   1. Other fatigue Laura Bush does feel that her weight is causing her energy to be lower than it should be. Fatigue may be related to obesity, depression or many other causes. Labs will be ordered, and in the meanwhile, Laura Bush will focus on self care including making healthy food choices, increasing physical activity and focusing on stress reduction.  Orders - EKG 12-Lead - Anemia panel  2. SOB (shortness of breath) on exertion Laura Bush does feel that she gets out of breath more easily that she used to when she exercises. Laura Bush's shortness of breath appears to be obesity related and exercise induced. She has agreed to work on weight loss and gradually increase exercise to treat her exercise induced shortness of breath. Will continue to monitor closely.  3. Essential hypertension Laura Bush is working on healthy weight loss and exercise to improve blood pressure control. We will watch for signs of hypotension as she continues her lifestyle modifications.  4. Other hyperlipidemia Cardiovascular risk and specific lipid/LDL goals reviewed.  We discussed several lifestyle modifications today and Laura Bush will continue to work on diet, exercise and weight loss efforts. Orders and follow up as documented in patient record.   Counseling Intensive lifestyle modifications are the first line treatment for this issue. . Dietary changes: Increase soluble fiber. Decrease simple carbohydrates. . Exercise changes: Moderate to vigorous-intensity aerobic activity 150 minutes per week if tolerated. . Lipid-lowering medications: see documented in medical record.  5. OSA (obstructive sleep apnea) Intensive lifestyle modifications are the first line treatment for this issue. We discussed several lifestyle modifications today  and she will continue to work on diet, exercise and weight loss efforts. We will continue to monitor. Orders and follow up as documented in patient record.   Counseling  Sleep apnea is a condition in which breathing pauses or becomes shallow during sleep. This happens over and over during the night. This disrupts your sleep and keeps your body from getting the rest that it needs, which can cause tiredness and lack of energy (fatigue) during the day.  Sleep apnea treatment: If you were given a device to open your airway while you sleep, USE IT!  Sleep hygiene:   Limit or avoid alcohol, caffeinated beverages, and cigarettes, especially close to bedtime.   Do not eat a large meal or eat spicy foods right before bedtime. This can lead to digestive discomfort that can make it hard for you to sleep.  Keep a sleep diary to help you and your health care provider figure out what could be causing your insomnia.  . Make your bedroom a dark, comfortable place where it is easy to fall asleep. ? Put up shades or blackout curtains to block light from outside. ? Use a white noise machine to block noise. ? Keep the temperature cool. . Limit screen use before bedtime. This includes: ? Watching TV. ? Using your smartphone, tablet, or computer. . Stick to a routine  that includes going to bed and waking up at the same times every day and night. This can help you fall asleep faster. Consider making a quiet activity, such as reading, part of your nighttime routine. . Try to avoid taking naps during the day so that you sleep better at night. . Get out of bed if you are still awake after 15 minutes of trying to sleep. Keep the lights down, but try reading or doing a quiet activity. When you feel sleepy, go back to bed.  6. History of breast cancer Followed by Oncology for this problem. Those encounter notes were reviewed.  7. Hyperglycemia Fasting labs will be obtained and results with be discussed with Laura Bush  in 2 weeks at her follow up visit. In the meanwhile Mairely was started on a lower simple carbohydrate diet and will work on weight loss efforts.  Orders - Insulin, random  8. Hair loss Will check labs today.  Orders - TSH - T4, free - T3  9. Depression screening Laura Bush had a positive depression screening. Depression is commonly associated with obesity and often results in emotional eating behaviors. We will monitor this closely and work on CBT to help improve the non-hunger eating patterns. Referral to Psychology may be required if no improvement is seen as she continues in our clinic.  10. Class 2 severe obesity with serious comorbidity and body mass index (BMI) of 39.0 to 39.9 in adult, unspecified obesity type [facility]) Laura Bush is currently in the action stage of change and her goal is to continue with weight loss efforts. I recommend Arlis begin the structured treatment plan as follows:  She has agreed to the [employer].  Exercise goals: As is.   Behavioral modification strategies: increasing lean protein intake, decreasing simple carbohydrates, increasing vegetables, increasing water intake and decreasing liquid calories.  She was informed of the importance of frequent follow-up visits to maximize her success with intensive lifestyle modifications for her multiple health conditions. She was informed we would discuss her lab results at her next visit unless there is a critical issue that needs to be addressed sooner. Laura Bush agreed to keep her next visit at the agreed upon time to discuss these results.  Objective:   Blood pressure 138/79, pulse (!) 57, temperature 98.6 F (37 C), temperature source Oral, height 4\' 11"  (1.499 m), weight 196 lb (88.9 kg), SpO2 97 %. Body mass index is 39.59 kg/m.  EKG: Normal sinus rhythm, rate 56 bpm.  Indirect Calorimeter completed today shows a VO2 of 213 and a REE of 1484.  Her calculated basal metabolic rate is 6734 thus her basal  metabolic rate is better than expected.  General: Cooperative, alert, well developed, in no acute distress. HEENT: Conjunctivae and lids unremarkable. Cardiovascular: Regular rhythm.  Lungs: Normal work of breathing. Neurologic: No focal deficits.   Lab Results  Component Value Date   CREATININE 0.85 10/03/2019   BUN 16 10/03/2019   NA 145 10/03/2019   K 3.8 10/03/2019   CL 107 10/03/2019   CO2 25 10/03/2019   Lab Results  Component Value Date   ALT 36 10/03/2019   AST 25 10/03/2019   ALKPHOS 39 10/03/2019   BILITOT 0.4 10/03/2019   Lab Results  Component Value Date   HGBA1C 5.6 04/03/2019   HGBA1C 5.6 03/28/2018   Lab Results  Component Value Date   TSH 1.55 08/13/2019   Lab Results  Component Value Date   CHOL 158 04/03/2019   HDL 49.60 04/03/2019  LDLCALC 75 04/03/2019   TRIG 169.0 (H) 04/03/2019   CHOLHDL 3 04/03/2019   Lab Results  Component Value Date   WBC 4.7 10/03/2019   HGB 11.8 (L) 10/03/2019   HCT 35.0 (L) 10/03/2019   MCV 97.8 10/03/2019   PLT 154 10/03/2019   Lab Results  Component Value Date   FERRITIN 112.0 12/04/2017   Attestation Statements:   This is the patient's first visit at [facility]. The patient's NEW PATIENT PACKET was reviewed at length. Included in the packet: current and past health history, medications, allergies, ROS, gynecologic history (women only), surgical history, family history, social history, weight history, weight loss surgery history (for those that have had weight loss surgery), nutritional evaluation, mood and food questionnaire, PHQ9, Epworth questionnaire, sleep habits questionnaire, patient life and health improvement goals questionnaire. These will all be scanned into the patient's chart under media.   During the visit, I independently reviewed the patient's EKG, bioimpedance scale results, and indirect calorimeter results. I used this information to tailor a meal plan for the patient that will  help her to lose weight and will improve her obesity-related conditions going forward. I performed a medically necessary appropriate examination and/or evaluation. I discussed the assessment and treatment plan with the patient. The patient was provided an opportunity to ask questions and all were answered. The patient agreed with the plan and demonstrated an understanding of the instructions. Labs were ordered at this visit and will be reviewed at the next visit unless more critical results need to be addressed immediately. Clinical information was updated and documented in the EMR.   Time spent on visit including pre-visit chart review and post-visit charting and care was 60 minutes.   I, Water quality scientist, CMA, am acting as Location manager for [employer], DO.  I have reviewed the above documentation for accuracy and completeness, and I agree with the above. Briscoe Deutscher, DO

## 2019-10-18 ENCOUNTER — Encounter: Payer: Self-pay | Admitting: Family Medicine

## 2019-10-18 DIAGNOSIS — D649 Anemia, unspecified: Secondary | ICD-10-CM

## 2019-10-18 LAB — ANEMIA PANEL
Ferritin: 253 ng/mL — ABNORMAL HIGH (ref 15–150)
Folate, Hemolysate: 529 ng/mL
Folate, RBC: 1293 ng/mL (ref >498)
Hematocrit: 40.9 % (ref 34.0–46.6)
Iron Saturation: 41 % (ref 15–55)
Iron: 120 ug/dL (ref 27–139)
Retic Ct Pct: 1.5 % (ref 0.6–2.6)
Total Iron Binding Capacity: 295 ug/dL (ref 250–450)
UIBC: 175 ug/dL (ref 118–369)
Vitamin B-12: 445 pg/mL (ref 232–1245)

## 2019-10-18 LAB — T4, FREE: Free T4: 1 ng/dL (ref 0.82–1.77)

## 2019-10-18 LAB — TSH: TSH: 1.37 u[IU]/mL (ref 0.450–4.500)

## 2019-10-18 LAB — INSULIN, RANDOM: INSULIN: 12.6 u[IU]/mL (ref 2.6–24.9)

## 2019-10-18 LAB — T3: T3, Total: 130 ng/dL (ref 71–180)

## 2019-10-22 ENCOUNTER — Encounter: Payer: Self-pay | Admitting: Family Medicine

## 2019-10-30 ENCOUNTER — Other Ambulatory Visit: Payer: Self-pay

## 2019-10-30 DIAGNOSIS — I10 Essential (primary) hypertension: Secondary | ICD-10-CM

## 2019-10-30 DIAGNOSIS — R739 Hyperglycemia, unspecified: Secondary | ICD-10-CM

## 2019-10-31 ENCOUNTER — Other Ambulatory Visit: Payer: Self-pay

## 2019-10-31 ENCOUNTER — Ambulatory Visit: Payer: Medicare HMO | Admitting: Pharmacist

## 2019-10-31 ENCOUNTER — Encounter (INDEPENDENT_AMBULATORY_CARE_PROVIDER_SITE_OTHER): Payer: Self-pay | Admitting: Family Medicine

## 2019-10-31 ENCOUNTER — Ambulatory Visit (INDEPENDENT_AMBULATORY_CARE_PROVIDER_SITE_OTHER): Payer: Medicare HMO | Admitting: Family Medicine

## 2019-10-31 VITALS — BP 129/78 | HR 60 | Temp 98.1°F | Ht 59.0 in | Wt 191.0 lb

## 2019-10-31 DIAGNOSIS — E8881 Metabolic syndrome: Secondary | ICD-10-CM

## 2019-10-31 DIAGNOSIS — K219 Gastro-esophageal reflux disease without esophagitis: Secondary | ICD-10-CM

## 2019-10-31 DIAGNOSIS — I10 Essential (primary) hypertension: Secondary | ICD-10-CM

## 2019-10-31 DIAGNOSIS — Z853 Personal history of malignant neoplasm of breast: Secondary | ICD-10-CM

## 2019-10-31 DIAGNOSIS — Z9989 Dependence on other enabling machines and devices: Secondary | ICD-10-CM | POA: Diagnosis not present

## 2019-10-31 DIAGNOSIS — G4733 Obstructive sleep apnea (adult) (pediatric): Secondary | ICD-10-CM | POA: Diagnosis not present

## 2019-10-31 DIAGNOSIS — E7800 Pure hypercholesterolemia, unspecified: Secondary | ICD-10-CM

## 2019-10-31 DIAGNOSIS — M8580 Other specified disorders of bone density and structure, unspecified site: Secondary | ICD-10-CM

## 2019-10-31 DIAGNOSIS — Z6838 Body mass index (BMI) 38.0-38.9, adult: Secondary | ICD-10-CM | POA: Diagnosis not present

## 2019-10-31 NOTE — Chronic Care Management (AMB) (Signed)
Chronic Care Management Pharmacy  Name: Laura Bush  MRN: [id number] DOB: 1950-11-22   Chief Complaint/ HPI  Laura Bush,  69 y.o. , female presents for their Initial CCM visit with the clinical pharmacist via telephone due to COVID-19 Pandemic.  PCP : Darreld Mclean, MD  Their chronic conditions include: HTN, HLD, GERD, Allergic Rhinitis, Osteopenia, Hx of Breast Cancer  Office Visits: 10/04/19: Interpretation of Vein study from Dr. Lorelei Pont - Venous reflux. Probable cause of leg swelling. Recommended compression stockings and leg elevation. Possible referral to vascular surgery.   10/02/19: Visit w/ Dr. Lorelei Pont - Bilateral leg edema. Ultrasound for venous reflux. Left foot pain. Referral to ortho. Labs ordered due to cold intolerance (TSH, ferritin). Labs ordered due to weight gain (TSH)  07/29/19: Medicare Annual Wellness Exam w/ Naaman Plummer, RN - Goals updated to increase physical activity   Consult Visit: 10/17/19: Weight Management visit w/ Dr. Juleen China - Desired weight loss 52 pounds. Would like to follow vegetarian or pescatarian diet. Pt having fatigue. Labs ordered (anemia panel, T3, T4, lipid, EKG)  10/03/19: Onc visit w/ Dr. Jana Hakim - Patient will complete 5 years of tamoxifen in December of 2021. Agreeable for patient to D/C at that time. November follow up appt with be "graduation" visit.   09/30/19: Cardio visit w/ Dr. Gaetano Hawthorne - Pt gaining weight and discouraged. [facility]erred [facility]. Dyspnea improved with switch from metoprolol to carvedilol. No med changes noted  07/23/19: Cardio visit w/ Dr. Gaetano Hawthorne - Pt's BP machine accurate. Blood pressure increased at visit and pt still experiencing swelling. Increase HCTZ to 25mg  daily.  06/28/19: Gastro visit w/ Dr. Henrene Pastor - Pt found to have peptic stricture 05/13/2019 and had esophagus dilated with 2 French Maloney dilator. Placed on pantoprazole 40mg  daily. No further reflux  complaints. Continued pantoprazole. Follow up in 1 year   Medications: Outpatient Encounter Medications as of 10/31/2019  Medication Sig Note  . aMILoride (MIDAMOR) 5 MG tablet Take 1 tablet (5 mg total) by mouth daily.   . B Complex-C (B-COMPLEX WITH VITAMIN C) tablet Take 1 tablet by mouth daily.   Marland Kitchen CALCIUM CARBONATE-VITAMIN D PO Take 1 tablet by mouth 2 (two) times daily. 600mg  calcium, D3 (1000)IU   . carvedilol (COREG) 12.5 MG tablet Take 1 tablet (12.5 mg total) by mouth 2 (two) times daily with a meal.   . Elderberry 575 MG/5ML SYRP Take by mouth daily.   Marland Kitchen gabapentin (NEURONTIN) 300 MG capsule Take 1 capsule (300 mg total) by mouth at bedtime.   Marland Kitchen loratadine (CLARITIN) 10 MG tablet Take 10 mg by mouth daily.   . pantoprazole (PROTONIX) 40 MG tablet Take 1 tablet (40 mg total) by mouth daily.   . simvastatin (ZOCOR) 20 MG tablet Take 1 tablet (20 mg total) by mouth daily.   . tamoxifen (NOLVADEX) 20 MG tablet Take 1 tablet (20 mg total) by mouth daily.   Marland Kitchen telmisartan (MICARDIS) 80 MG tablet Take 1 tablet (80 mg total) by mouth daily.   Marland Kitchen Fexofenadine HCl (ALLEGRA ALLERGY PO) Take by mouth daily. 10/31/2019: Changed to claritin  . hydrochlorothiazide (HYDRODIURIL) 25 MG tablet Take 1 tablet (25 mg total) by mouth daily.    No facility-administered encounter medications on file as of 10/31/2019.   Immunization History  Administered Date(s) Administered  . Fluad Quad(high Dose 65+) 04/03/2019  . Hepatitis A 08/24/1995, 12/23/1996  . Hepatitis B 12/23/1996, 02/14/1997, 05/24/1997, 06/04/1998  . IPV 08/24/1995  .  Influenza Split 04/06/2017  . Influenza, High Dose Seasonal PF 05/10/2016, 04/06/2017  . Influenza,inj,Quad PF,6+ Mos 04/30/2015  . Influenza-Unspecified 07/07/2005, 06/08/2006, 05/28/2007, 04/23/2008, 05/23/2011, 05/14/2012, 05/20/2013, 04/12/2018  . Meningococcal Conjugate 05/07/2008  . Meningococcal Mcv4o 05/07/2008  . PFIZER SARS-COV-2 Vaccination 09/13/2019,  10/08/2019  . Pneumococcal Conjugate-13 03/22/2017  . Pneumococcal Polysaccharide-23 06/08/2006, 03/28/2018  . Tdap 09/19/2011  . Typhoid Inactivated 05/07/2008  . Yellow Fever 05/08/2008  . Zoster 06/21/2013  . Zoster Recombinat (Shingrix) 10/06/2018, 01/18/2019     Current Diagnosis/Assessment:  Goals Addressed            This Visit's Progress   . Blood pressure goal less than 140/90      . Consider completing DEXA Scan around 07/26/20 or later      . Continue plant based diet to help with      . LDL goal less than 100      . Pharmacy Care Plan       CARE PLAN ENTRY  Current Barriers:  . Chronic Disease Management support, education, and care coordination needs related to HTN, HLD, GERD, Allergic Rhinitis, Osteopenia, Hx of Breast Cancer  Pharmacist Clinical Goal(s):  Marland Kitchen LDL goal <100 . TG goal <150 . Blood pressure goal <140/90   Interventions: . Comprehensive medication review performed. . Consider completing DEXA around 07/26/20 or after  Patient Self Care Activities:  . Patient verbalizes understanding of plan to follow as described above, Self administers medications as prescribed, Calls pharmacy for medication refills, and Calls provider office for new concerns or questions  Initial goal documentation     . 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  Uses pill box.  Does mail order and local pharmacy.  Hypertension   CMP Latest Ref Rng & Units 10/03/2019 08/13/2019 04/03/2019  Glucose 70 - 99 mg/dL 101(H) 141(H) 86  BUN 8 - 23 mg/dL 16 13 15   Creatinine 0.44 - 1.00 mg/dL 0.85 0.89 0.85  Sodium 135 - 145 mmol/L 145 141 143  Potassium 3.5 - 5.1 mmol/L 3.8 3.7 4.1  Chloride 98 - 111 mmol/L 107 102 105  CO2 22 - 32 mmol/L 25 29 30   Calcium 8.9 - 10.3 mg/dL 9.0 9.5 9.7  Total Protein 6.5 - 8.1 g/dL 6.6 - 6.4  Total Bilirubin 0.3 - 1.2 mg/dL 0.4 - 0.5  Alkaline Phos 38 - 126 U/L 39 -  31(L)  AST 15 - 41 U/L 25 - 19  ALT 0 - 44 U/L 36 - 23  GFR      >60   63    66.50  BP today is:  Unable to assess due to phone visit  Office blood pressures are  BP Readings from Last 3 Encounters:  10/17/19 138/79  10/03/19 140/66  10/02/19 126/70   BP goal <140/90  Patient has failed these meds in the past: None noted  Patient is currently controlled on the following medications: telmisartan 80mg  daily, hydrochlorothiazide 25mg , carvedilol 12.5mg  twice daily, amiloride 5mg   HCTZ increased to 25mg  by Dr. Gaetano Hawthorne For better management of swelling.  Pt is wearing compression stockings and elevating feet  Systolic Highest 846 Lowest 659  Diastolic Highest 82 Lowest 67  Patient checks BP at home 1-2x per week  Plan -Continue current medications   Hyperlipidemia   Lipid Panel     Component Value Date/Time   CHOL 158 04/03/2019 1037  TRIG 169.0 (H) 04/03/2019 1037   HDL 49.60 04/03/2019 1037   CHOLHDL 3 04/03/2019 1037   VLDL 33.8 04/03/2019 1037   LDLCALC 75 04/03/2019 1037    LDL goal <100 TG goal <150  The 10-year ASCVD risk score Mikey Bussing DC Jr., et al., 2013) is: 11.1%   Values used to calculate the score:     Age: 66 years     Sex: Female     Is Non-Hispanic African American: No     Diabetic: No     Tobacco smoker: No     Systolic Blood Pressure: 381 mmHg     Is BP treated: Yes     HDL Cholesterol: 49.6 mg/dL     Total Cholesterol: 158 mg/dL   Patient has failed these meds in past: None noted  Patient is currently controlled,except for TG on the following medications: simvastatin 20mg  daily  September: Went to a plant based diet  Is hopeful her TG will be down  We discussed:  How a plant based diet could greatly affect TG levels positively. Encouraged patient to continue plant based diet  Plan -Continue current medications  -Continue plant based diet  GERD    Patient has failed these meds in past: None noted  Patient is currently controlled  on the following medications: omeprazole 40mg  daily   No breakthrough sx  Esophagus was stretched.  Was told she had scarring from GERD  Plan -Continue current medications   Allergic Rhinitis    Patient has failed these meds in past: switches between different products due to efficacy wearing off Patient is currently controlled on the following medications: Claritin 10mg  daily  Improved since switch to Claritin  Cetirizine was not lasting all Khadejah Son Has to switch antihistamine products monthly due to inefficacy over time  Plan -Continue current medications    Osteopenia   07/26/18: DEXA - Osteopenia T-Score Femur Neck Left -1.3  Patient has failed these meds in past: None noted  Patient is currently controlled on the following medications: calcium/vitamin D twice daily  Calcium BID 650mg  calcium 1000 units vitamin D  We discussed:  Importance of fall prevention  Plan -Complete DEXA Scan after 07/26/2020 -Continue current medications   History of Breast Cancer    Patient has failed these meds in past: None noted  Patient is currently controlled on the following medications: tamoxifen 20mg  daily, gabapentin 300mg  HS  Seeing oncologist in November. Will most likely come off of tamoxifen 20mg . Completing 5 years. Gabapentin 300mg  for hot flashes and insomnia with tamoxifen  Plan -Continue current medications   Miscellenous Meds B complex daily Eldberry daily

## 2019-10-31 NOTE — Patient Instructions (Addendum)
Visit Information  Goals Addressed            This Visit's Progress   . Blood pressure goal less than 140/90      . Consider completing DEXA Scan around 07/26/20 or later      . Continue plant based diet to help with      . LDL goal less than 100      . Pharmacy Care Plan       CARE PLAN ENTRY  Current Barriers:  . Chronic Disease Management support, education, and care coordination needs related to HTN, HLD, GERD, Allergic Rhinitis, Osteopenia, Hx of Breast Cancer  Pharmacist Clinical Goal(s):  Marland Kitchen LDL goal <100 . TG goal <150 . Blood pressure goal <140/90   Interventions: . Comprehensive medication review performed. . Consider completing DEXA around 07/26/20 or after  Patient Self Care Activities:  . Patient verbalizes understanding of plan to follow as described above, Self administers medications as prescribed, Calls pharmacy for medication refills, and Calls provider office for new concerns or questions  Initial goal documentation     . Triglyceride (TRIG) level less than 150         Laura Bush was given information about Chronic Care Management services today including:  1. CCM service includes personalized support from designated clinical staff supervised by her physician, including individualized plan of care and coordination with other care providers 2. 24/7 contact phone numbers for assistance for urgent and routine care needs. 3. Standard insurance, coinsurance, copays and deductibles apply for chronic care management only during months in which we provide at least 20 minutes of these services. Most insurances cover these services at 100%, however patients may be responsible for any copay, coinsurance and/or deductible if applicable. This service may help you avoid the need for more expensive face-to-face services. 4. Only one practitioner may furnish and bill the service in a calendar month. 5. The patient may stop CCM services at any time (effective at the end of  the month) by phone call to the office staff.  Patient agreed to services and verbal consent obtained.   The patient verbalized understanding of instructions provided today and agreed to receive a mailed copy of patient instruction and/or educational materials. Telephone follow up appointment with pharmacy team member scheduled for: 04/30/2020  Melvenia Beam Pola Furno, PharmD Clinical Pharmacist [facility] [phone]   Cholesterol Content in Foods Cholesterol is a waxy, fat-like substance that helps to carry fat in the blood. The body needs cholesterol in small amounts, but too much cholesterol can cause damage to the arteries and heart. Most people should eat less than 200 milligrams (mg) of cholesterol a Laura Bush. Foods with cholesterol  Cholesterol is found in animal-based foods, such as meat, seafood, and dairy. Generally, low-fat dairy and lean meats have less cholesterol than full-fat dairy and fatty meats. The milligrams of cholesterol per serving (mg per serving) of common cholesterol-containing foods are listed below. Meat and other proteins  Egg -- one large whole egg has 186 mg.  Veal shank -- 4 oz has 141 mg.  Lean ground [place] (93% lean) -- 4 oz has 118 mg.  Fat-trimmed lamb loin -- 4 oz has 106 mg.  Lean ground beef (90% lean) -- 4 oz has 100 mg.  Lobster -- 3.5 oz has 90 mg.  Pork loin chops -- 4 oz has 86 mg.  Canned salmon -- 3.5 oz has 83 mg.  Fat-trimmed beef top loin -- 4 oz  has 78 mg.  Frankfurter -- 1 frank (3.5 oz) has 77 mg.  Crab -- 3.5 oz has 71 mg.  Roasted chicken without skin, white meat -- 4 oz has 66 mg.  Light bologna -- 2 oz has 45 mg.  Deli-cut [place] -- 2 oz has 31 mg.  Canned tuna -- 3.5 oz has 31 mg.  Laura Bush -- 1 oz has 29 mg.  Oysters and mussels (raw) -- 3.5 oz has 25 mg.  Mackerel -- 1 oz has 22 mg.  Trout -- 1 oz has 20 mg.  Pork sausage -- 1 link (1 oz) has 17 mg.  Salmon -- 1 oz has 16  mg.  Tilapia -- 1 oz has 14 mg. Dairy  Soft-serve ice cream --  cup (4 oz) has 103 mg.  Whole-milk yogurt -- 1 cup (8 oz) has 29 mg.  Cheddar cheese -- 1 oz has 28 mg.  American cheese -- 1 oz has 28 mg.  Whole milk -- 1 cup (8 oz) has 23 mg.  2% milk -- 1 cup (8 oz) has 18 mg.  Cream cheese -- 1 tablespoon (Tbsp) has 15 mg.  Cottage cheese --  cup (4 oz) has 14 mg.  Low-fat (1%) milk -- 1 cup (8 oz) has 10 mg.  Sour cream -- 1 Tbsp has 8.5 mg.  Low-fat yogurt -- 1 cup (8 oz) has 8 mg.  Nonfat Greek yogurt -- 1 cup (8 oz) has 7 mg.  Half-and-half cream -- 1 Tbsp has 5 mg. Fats and oils  Cod liver oil -- 1 tablespoon (Tbsp) has 82 mg.  Butter -- 1 Tbsp has 15 mg.  Lard -- 1 Tbsp has 14 mg.  Bacon grease -- 1 Tbsp has 14 mg.  Mayonnaise -- 1 Tbsp has 5-10 mg.  Margarine -- 1 Tbsp has 3-10 mg. Exact amounts of cholesterol in these foods may vary depending on specific ingredients and brands. Foods without cholesterol Most plant-based foods do not have cholesterol unless you combine them with a food that has cholesterol. Foods without cholesterol include:  Grains and cereals.  Vegetables.  Fruits.  Vegetable oils, such as olive, canola, and sunflower oil.  Legumes, such as peas, beans, and lentils.  Nuts and seeds.  Egg whites. Summary  The body needs cholesterol in small amounts, but too much cholesterol can cause damage to the arteries and heart.  Most people should eat less than 200 milligrams (mg) of cholesterol a Laura Bush. This information is not intended to replace advice given to you by your health care provider. Make sure you discuss any questions you have with your health care provider. Document Revised: 07/07/2017 Document Reviewed: 03/21/2017 Elsevier Patient Education  [place].

## 2019-11-01 ENCOUNTER — Telehealth: Payer: Medicare HMO

## 2019-11-04 NOTE — Progress Notes (Signed)
Chief Complaint:   OBESITY Laura Bush Bush here to discuss her progress with her obesity treatment plan along with Bush-up of her obesity related diagnoses. Laura Bush Bush on the [employer] and states she Bush following her eating plan approximately 60% of the time. Laura Bush states she Bush exercising for 0 minutes 0 times per week.  Today's visit Bush #: 2 Starting weight: 196 lbs Starting date: 10/17/2019 Today's weight: 191 lbs Today's date: 10/31/2019 Total lbs lost to date: 5 lbs Total lbs lost since last in-office visit: 5 lbs  Interim History: Laura Bush says she struggles with her lack of choices and would like to journal.  She says she traveled last week and made good food choices.  She will be traveling next week as well.  Subjective:   1. Insulin resistance Laura Bush has a diagnosis of insulin resistance based on her elevated fasting insulin level >5. She continues to work on diet and exercise to decrease her risk of diabetes.  Lab Results  Component Value Date   INSULIN 12.6 10/17/2019   Lab Results  Component Value Date   HGBA1C 5.6 04/03/2019   2. OSA on CPAP Laura Bush has a diagnosis of sleep apnea. She reports that she Bush using a CPAP regularly.   3. History of breast cancer Laura Bush has a history of breast cancer and Bush taking tamoxifen and gabapentin.  4. Essential hypertension Review: no chest pain on exertion, no dyspnea on exertion, no swelling of ankles.   BP Readings from Last 3 Encounters:  10/31/19 129/78  10/17/19 138/79  10/03/19 140/66   Assessment/Plan:   1. Insulin resistance Laura Bush will continue to work on weight loss, exercise, and decreasing simple carbohydrates to help decrease the risk of diabetes. Laura Bush agreed to Bush-up with [place] as directed to closely monitor her progress.  2. OSA on CPAP Intensive lifestyle modifications are the first line treatment for this issue. We discussed several lifestyle modifications today and she will continue to work  on diet, exercise and weight loss efforts. We will continue to monitor. Orders and Bush up as documented in patient record.   Counseling  Sleep apnea Bush a condition in which breathing pauses or becomes shallow during sleep. This happens over and over during the night. This disrupts your sleep and keeps your body from getting the rest that it needs, which can cause tiredness and lack of energy (fatigue) during the day.  Sleep apnea treatment: If you were given a device to open your airway while you sleep, USE IT!  Sleep hygiene:   Limit or avoid alcohol, caffeinated beverages, and cigarettes, especially close to bedtime.   Do not eat a large meal or eat spicy foods right before bedtime. This can lead to digestive discomfort that can make it hard for you to sleep.  Keep a sleep diary to help you and your health care provider figure out what could be causing your insomnia.  . Make your bedroom a dark, comfortable place where it Bush easy to fall asleep. ? Put up shades or blackout curtains to block light from outside. ? Use a white noise machine to block noise. ? Keep the temperature cool. . Limit screen use before bedtime. This includes: ? Watching TV. ? Using your smartphone, tablet, or computer. . Stick to a routine that includes going to bed and waking up at the same times every day and night. This can help you fall asleep faster. Consider making a quiet activity, such as reading, part of  your nighttime routine. . Try to avoid taking naps during the day so that you sleep better at night. . Get out of bed if you are still awake after 15 minutes of trying to sleep. Keep the lights down, but try reading or doing a quiet activity. When you feel sleepy, go back to bed.  3. History of breast cancer Will continue to monitor.  4. Essential hypertension Laura Bush Bush working on healthy weight loss and exercise to improve blood pressure control. We will watch for signs of hypotension as she  continues her lifestyle modifications.  5. Class 2 severe obesity with serious comorbidity and body mass index (BMI) of 38.0 to 38.9 in adult, unspecified obesity type Laura Bush) Laura Bush currently in the action stage of change. As such, her goal Bush to continue with weight loss efforts. She has agreed to keeping a food journal and adhering to recommended goals of 1000-1100 calories and 75+ grams of protein and the Laura Bush.   Exercise goals: No exercise has been prescribed at this time.  Behavioral modification strategies: increasing lean protein intake and increasing water intake.  Laura Bush has agreed to Bush-up with our clinic in 2 weeks. She Bush informed of the importance of frequent Bush-up visits to maximize her success with intensive lifestyle modifications for her multiple health conditions.   Objective:   Blood pressure 129/78, pulse 60, temperature 98.1 F (36.7 C), temperature source Oral, height 4\' 11"  (1.499 m), weight 191 lb (86.6 kg), SpO2 99 %. Body mass index Bush 38.58 kg/m.  General: Cooperative, alert, well developed, in no acute distress. HEENT: Conjunctivae and lids unremarkable. Cardiovascular: Regular rhythm.  Lungs: Normal work of breathing. Neurologic: No focal deficits.   Lab Results  Component Value Date   CREATININE 0.85 10/03/2019   BUN 16 10/03/2019   NA 145 10/03/2019   K 3.8 10/03/2019   CL 107 10/03/2019   CO2 25 10/03/2019   Lab Results  Component Value Date   ALT 36 10/03/2019   AST 25 10/03/2019   ALKPHOS 39 10/03/2019   BILITOT 0.4 10/03/2019   Lab Results  Component Value Date   HGBA1C 5.6 04/03/2019   HGBA1C 5.6 03/28/2018   Lab Results  Component Value Date   INSULIN 12.6 10/17/2019   Lab Results  Component Value Date   TSH 1.370 10/17/2019   Lab Results  Component Value Date   CHOL 158 04/03/2019   HDL 49.60 04/03/2019   LDLCALC 75 04/03/2019   TRIG 169.0 (H) 04/03/2019   CHOLHDL 3 04/03/2019   Lab Results    Component Value Date   WBC 4.7 10/03/2019   HGB 11.8 (L) 10/03/2019   HCT 40.9 10/17/2019   MCV 97.8 10/03/2019   PLT 154 10/03/2019   Lab Results  Component Value Date   IRON 120 10/17/2019   TIBC 295 10/17/2019   FERRITIN 253 (H) 10/17/2019   Obesity Behavioral Intervention:   Approximately 15 minutes were spent on the discussion below.  ASK: We discussed the diagnosis of obesity with Laura Bush today and Laura Bush agreed to give [place] permission to discuss obesity behavioral modification therapy today.  ASSESS: Laura Bush has the diagnosis of obesity and her BMI today Bush 38.6. Laura Bush in the action stage of change.   ADVISE: Laura Bush educated on the multiple health risks of obesity as well as the benefit of weight loss to improve her health. She Bush advised of the need for long term treatment and the importance of lifestyle modifications to  improve her current health and to decrease her risk of future health problems.  AGREE: Multiple dietary modification options and treatment options were discussed and Laura Bush the recommendations documented in the above note.  ARRANGE: Laura Bush educated on the importance of frequent visits to treat obesity as outlined per CMS and USPSTF guidelines and agreed to schedule her next Bush up appointment today.  Attestation Statements:   Reviewed by clinician on day of visit: allergies, medications, problem list, medical history, surgical history, family history, social history, and previous encounter notes.  I, Water quality scientist, CMA, am acting as Location manager for [employer], DO.  I have reviewed the above documentation for accuracy and completeness, and I agree with the above. Briscoe Deutscher, DO

## 2019-11-15 DIAGNOSIS — M76822 Posterior tibial tendinitis, left leg: Secondary | ICD-10-CM | POA: Diagnosis not present

## 2019-11-20 ENCOUNTER — Other Ambulatory Visit: Payer: Self-pay

## 2019-11-20 ENCOUNTER — Encounter (INDEPENDENT_AMBULATORY_CARE_PROVIDER_SITE_OTHER): Payer: Self-pay | Admitting: Family Medicine

## 2019-11-20 ENCOUNTER — Ambulatory Visit (INDEPENDENT_AMBULATORY_CARE_PROVIDER_SITE_OTHER): Payer: Medicare HMO | Admitting: Family Medicine

## 2019-11-20 VITALS — BP 114/70 | HR 54 | Temp 98.2°F | Ht 59.0 in | Wt 188.0 lb

## 2019-11-20 DIAGNOSIS — E8881 Metabolic syndrome: Secondary | ICD-10-CM | POA: Diagnosis not present

## 2019-11-20 DIAGNOSIS — I10 Essential (primary) hypertension: Secondary | ICD-10-CM | POA: Diagnosis not present

## 2019-11-20 DIAGNOSIS — G4733 Obstructive sleep apnea (adult) (pediatric): Secondary | ICD-10-CM

## 2019-11-20 DIAGNOSIS — Z9989 Dependence on other enabling machines and devices: Secondary | ICD-10-CM | POA: Diagnosis not present

## 2019-11-20 DIAGNOSIS — Z6838 Body mass index (BMI) 38.0-38.9, adult: Secondary | ICD-10-CM

## 2019-11-20 MED ORDER — METFORMIN HCL 500 MG PO TABS: 500.0000 mg | ORAL_TABLET | Freq: Every day | ORAL | 0 refills | Status: DC

## 2019-11-20 NOTE — Progress Notes (Signed)
Chief Complaint:   OBESITY Laura Bush is here to discuss her progress with her obesity treatment plan along with follow-up of her obesity related diagnoses. Laura Bush is on the [employer] and states she is following her eating plan approximately 95% of the time. Laura Bush states she just started walking for exercise.  Today's visit was #: 3 Starting weight: 196 lbs Starting date: 10/17/2019 Today's weight: 188 lbs Today's date: 11/20/2019 Total lbs lost to date: 8 lbs Total lbs lost since last in-office visit: 3 lbs  Interim History: Laura Bush reports that she has walked 3/4 mile with no pain in her ankle.  She just returned from visiting her mother in [place].  Subjective:   1. Insulin resistance Laura Bush has a diagnosis of insulin resistance based on her elevated fasting insulin level >5. She continues to work on diet and exercise to decrease her risk of diabetes.  Lab Results  Component Value Date   INSULIN 12.6 10/17/2019   Lab Results  Component Value Date   HGBA1C 5.6 04/03/2019   2. OSA on CPAP Laura Bush has a diagnosis of sleep apnea. She reports that she is using a CPAP regularly.   3. Essential hypertension Review: taking medications as instructed, no medication side effects noted, no chest pain on exertion, no dyspnea on exertion, no swelling of ankles.   BP Readings from Last 3 Encounters:  11/20/19 114/70  10/31/19 129/78  10/17/19 138/79   Assessment/Plan:   1. Insulin resistance Laura Bush will continue to work on weight loss, exercise, and decreasing simple carbohydrates to help decrease the risk of diabetes. Laura Bush agreed to follow-up with [place] as directed to closely monitor her progress.  Orders - metFORMIN (GLUCOPHAGE) 500 MG tablet; Take 1 tablet (500 mg total) by mouth daily.  Dispense: 30 tablet; Refill: 0  2. OSA on CPAP Intensive lifestyle modifications are the first line treatment for this issue. We discussed several lifestyle modifications today and she  will continue to work on diet, exercise and weight loss efforts. We will continue to monitor. Orders and follow up as documented in patient record.   Counseling  Sleep apnea is a condition in which breathing pauses or becomes shallow during sleep. This happens over and over during the night. This disrupts your sleep and keeps your body from getting the rest that it needs, which can cause tiredness and lack of energy (fatigue) during the day.  Sleep apnea treatment: If you were given a device to open your airway while you sleep, USE IT!  Sleep hygiene:   Limit or avoid alcohol, caffeinated beverages, and cigarettes, especially close to bedtime.   Do not eat a large meal or eat spicy foods right before bedtime. This can lead to digestive discomfort that can make it hard for you to sleep.  Keep a sleep diary to help you and your health care provider figure out what could be causing your insomnia.  . Make your bedroom a dark, comfortable place where it is easy to fall asleep. ? Put up shades or blackout curtains to block light from outside. ? Use a white noise machine to block noise. ? Keep the temperature cool. . Limit screen use before bedtime. This includes: ? Watching TV. ? Using your smartphone, tablet, or computer. . Stick to a routine that includes going to bed and waking up at the same times every day and night. This can help you fall asleep faster. Consider making a quiet activity, such as reading, part of your nighttime  routine. . Try to avoid taking naps during the day so that you sleep better at night. . Get out of bed if you are still awake after 15 minutes of trying to sleep. Keep the lights down, but try reading or doing a quiet activity. When you feel sleepy, go back to bed.  3. Essential hypertension Laura Bush is working on healthy weight loss and exercise to improve blood pressure control. We will watch for signs of hypotension as she continues her lifestyle modifications.  4.  Class 2 severe obesity with serious comorbidity and body mass index (BMI) of 38.0 to 38.9 in adult, unspecified obesity type Laura Bush) Laura Bush is currently in the action stage of change. As such, her goal is to continue with weight loss efforts. She has agreed to the [employer].   Exercise goals: As is.  Behavioral modification strategies: increasing lean protein intake and increasing water intake.  Laura Bush has agreed to follow-up with our clinic in 2 weeks. She was informed of the importance of frequent follow-up visits to maximize her success with intensive lifestyle modifications for her multiple health conditions.   Objective:   Blood pressure 114/70, pulse (!) 54, temperature 98.2 F (36.8 C), temperature source Oral, height 4\' 11"  (1.499 m), weight 188 lb (85.3 kg), SpO2 98 %. Body mass index is 37.97 kg/m.  General: Cooperative, alert, well developed, in no acute distress. HEENT: Conjunctivae and lids unremarkable. Cardiovascular: Regular rhythm.  Lungs: Normal work of breathing. Neurologic: No focal deficits.   Lab Results  Component Value Date   CREATININE 0.85 10/03/2019   BUN 16 10/03/2019   NA 145 10/03/2019   K 3.8 10/03/2019   CL 107 10/03/2019   CO2 25 10/03/2019   Lab Results  Component Value Date   ALT 36 10/03/2019   AST 25 10/03/2019   ALKPHOS 39 10/03/2019   BILITOT 0.4 10/03/2019   Lab Results  Component Value Date   HGBA1C 5.6 04/03/2019   HGBA1C 5.6 03/28/2018   Lab Results  Component Value Date   INSULIN 12.6 10/17/2019   Lab Results  Component Value Date   TSH 1.370 10/17/2019   Lab Results  Component Value Date   CHOL 158 04/03/2019   HDL 49.60 04/03/2019   LDLCALC 75 04/03/2019   TRIG 169.0 (H) 04/03/2019   CHOLHDL 3 04/03/2019   Lab Results  Component Value Date   WBC 4.7 10/03/2019   HGB 11.8 (L) 10/03/2019   HCT 40.9 10/17/2019   MCV 97.8 10/03/2019   PLT 154 10/03/2019   Lab Results  Component Value Date   IRON 120  10/17/2019   TIBC 295 10/17/2019   FERRITIN 253 (H) 10/17/2019   Obesity Behavioral Intervention:   Approximately 15 minutes were spent on the discussion below.  ASK: We discussed the diagnosis of obesity with Laura Bush today and Laura Bush agreed to give [place] permission to discuss obesity behavioral modification therapy today.  ASSESS: Laura Bush has the diagnosis of obesity and her BMI today is 38.0. Laura Bush is in the action stage of change.   ADVISE: Laura Bush was educated on the multiple health risks of obesity as well as the benefit of weight loss to improve her health. She was advised of the need for long term treatment and the importance of lifestyle modifications to improve her current health and to decrease her risk of future health problems.  AGREE: Multiple dietary modification options and treatment options were discussed and Laura Bush agreed to follow the recommendations documented in the above note.  ARRANGE: Laura Bush was educated on the importance of frequent visits to treat obesity as outlined per CMS and USPSTF guidelines and agreed to schedule her next follow up appointment today.  Attestation Statements:   Reviewed by clinician on day of visit: allergies, medications, problem list, medical history, surgical history, family history, social history, and previous encounter notes.  I, Water quality scientist, CMA, am acting as Location manager for [employer], DO.  I have reviewed the above documentation for accuracy and completeness, and I agree with the above. Briscoe Deutscher, DO

## 2019-12-02 ENCOUNTER — Encounter (INDEPENDENT_AMBULATORY_CARE_PROVIDER_SITE_OTHER): Payer: Self-pay | Admitting: Family Medicine

## 2019-12-03 NOTE — Telephone Encounter (Signed)
Please advise. Thanks.  

## 2019-12-11 ENCOUNTER — Other Ambulatory Visit (INDEPENDENT_AMBULATORY_CARE_PROVIDER_SITE_OTHER): Payer: Self-pay | Admitting: Family Medicine

## 2019-12-11 DIAGNOSIS — E8881 Metabolic syndrome: Secondary | ICD-10-CM

## 2019-12-12 ENCOUNTER — Ambulatory Visit (INDEPENDENT_AMBULATORY_CARE_PROVIDER_SITE_OTHER): Payer: Medicare HMO | Admitting: Family Medicine

## 2019-12-12 ENCOUNTER — Other Ambulatory Visit: Payer: Self-pay

## 2019-12-12 ENCOUNTER — Encounter (INDEPENDENT_AMBULATORY_CARE_PROVIDER_SITE_OTHER): Payer: Self-pay | Admitting: Family Medicine

## 2019-12-12 VITALS — BP 100/67 | HR 53 | Temp 98.3°F | Ht 59.0 in | Wt 184.0 lb

## 2019-12-12 DIAGNOSIS — Z6837 Body mass index (BMI) 37.0-37.9, adult: Secondary | ICD-10-CM | POA: Diagnosis not present

## 2019-12-12 DIAGNOSIS — E8881 Metabolic syndrome: Secondary | ICD-10-CM

## 2019-12-12 DIAGNOSIS — D649 Anemia, unspecified: Secondary | ICD-10-CM | POA: Diagnosis not present

## 2019-12-12 DIAGNOSIS — E88819 Insulin resistance, unspecified: Secondary | ICD-10-CM

## 2019-12-12 DIAGNOSIS — I10 Essential (primary) hypertension: Secondary | ICD-10-CM | POA: Diagnosis not present

## 2019-12-12 NOTE — Progress Notes (Signed)
Chief Complaint:   OBESITY Laura Bush is here to discuss her progress with her obesity treatment plan along with follow-up of her obesity related diagnoses. Laura Bush is on the [employer] and states she is following her eating plan approximately 100% of the time. Laura Bush states she is walking 1+ miles 6 times per week.  Today's visit was #: 4 Starting weight: 196 lbs Starting date: 10/17/2019 Today's weight: 184 lbs Today's date: 12/14/2019 Total lbs lost to date: 12 lbs Total lbs lost since last in-office visit: 4 lbs  Interim History: Laura Bush called about having severe fatigue after starting metformin, see previous messages.  She feels that it was likely due to hosting family and babysitting twins.  She would like to restart the metformin as it was helpful to decrease polyphagia.  Subjective:   1. Essential hypertension Review: taking medications as instructed, no medication side effects noted, no chest pain on exertion, no dyspnea on exertion, no swelling of ankles.   BP Readings from Last 3 Encounters:  12/12/19 100/67  11/20/19 114/70  10/31/19 129/78   2. Insulin resistance Laura Bush has a diagnosis of insulin resistance based on her elevated fasting insulin level >5. She continues to work on diet and exercise to decrease her risk of diabetes.  Lab Results  Component Value Date   INSULIN 12.6 10/17/2019   Lab Results  Component Value Date   HGBA1C 5.6 04/03/2019   3. Metabolic syndrome Risk factors (3 or more constitute Metabolic Syndrome): waist > 35" for female impaired fasting blood sugar on blood pressure medication on cholesterol medication.  4. Anemia CBC Latest Ref Rng & Units 10/03/2019  WBC 3.4 - 10.8 x10E3/uL 4.7  Hemoglobin 11.1 - 15.9 g/dL 11.8(L)  Hematocrit 34.0 - 46.6 % 35.0(L)  Platelets 150 - 450 x10E3/uL 154   Lab Results  Component Value Date   IRON 120 10/17/2019   TIBC 295 10/17/2019   FERRITIN 206 (H) 12/12/2019   Lab Results  Component  Value Date   VITAMINB12 445 10/17/2019   Assessment/Plan:   1. Essential hypertension Laura Bush is working on healthy weight loss and exercise to improve blood pressure control. We will watch for signs of hypotension as she continues her lifestyle modifications.  2. Insulin resistance Laura Bush will continue to work on weight loss, exercise, and decreasing simple carbohydrates to help decrease the risk of diabetes. Laura Bush agreed to follow-up with [place] as directed to closely monitor her progress.  3. Metabolic syndrome Counseling: Intensive lifestyle modifications are the first line treatment for this issue. We discussed several lifestyle modifications today and she will continue to work on diet, exercise and weight loss efforts. We will continue to monitor. Orders and follow up as documented in patient record.  Counseling  Metabolic syndrome is a combination of conditions such as abdominal obesity, high blood sugar, high blood pressure, and unhealthy levels of cholesterol. Metabolic syndrome raises your risk of developing heart disease, diabetes, and stroke.  Following a heart-healthy diet can help you lower your risk of heart disease and improve metabolic syndrome. This diet includes lean proteins, healthy fats, nuts, and plenty of fruits and vegetables.  Along with regular exercise, following a healthy diet can help your body use insulin better and help you lose weight.  A common goal for people with metabolic syndrome is to lose 7-10% of their starting weight.  4. Anemia, unspecified type We will recheck labs today. Orders and follow up as documented in patient record.  Counseling . Iron  is essential for our bodies to make red blood cells.  Reasons that someone may be deficient include: an iron-deficient diet (more likely in those following vegan or vegetarian diets), women with heavy menses, patients with GI disorders or poor absorption, patients that have had bariatric surgery, frequent  blood donors, patients with cancer, and patients with heart disease.   Laura Bush foods include dark leafy greens, red and white meats, eggs, seafood, and beans.   . Certain foods and drinks prevent your body from absorbing iron properly. Avoid eating these foods in the same meal as iron-rich foods or with iron supplements. These foods include: coffee, black tea, and red wine; milk, dairy products, and foods that are high in calcium; beans and soybeans; whole grains.  . Constipation can be a side effect of iron supplementation. Increased water and fiber intake are helpful. Water goal: > 2 liters/day. Fiber goal: > 25 grams/day.  Orders - CBC with Differential/Platelet - Ferritin  5. Class 2 severe obesity with serious comorbidity and body mass index (BMI) of 37.0 to 37.9 in adult, unspecified obesity type Laura Bush) Laura Bush is currently in the action stage of change. As such, her goal is to continue with weight loss efforts. She has agreed to the [employer].   Exercise goals: For substantial health benefits, adults should do at least 150 minutes (2 hours and 30 minutes) a week of moderate-intensity, or 75 minutes (1 hour and 15 minutes) a week of vigorous-intensity aerobic physical activity, or an equivalent combination of moderate- and vigorous-intensity aerobic activity. Aerobic activity should be performed in episodes of at least 10 minutes, and preferably, it should be spread throughout the week.  Behavioral modification strategies: increasing lean protein intake and increasing water intake.  Laura Bush has agreed to follow-up with our clinic in 2 weeks. She was informed of the importance of frequent follow-up visits to maximize her success with intensive lifestyle modifications for her multiple health conditions.   Laura Bush was informed we would discuss her lab results at her next visit unless there is a critical issue that needs to be addressed sooner. Laura Bush agreed to keep her next visit at the  agreed upon time to discuss these results.  Objective:   Blood pressure 100/67, pulse (!) 53, temperature 98.3 F (36.8 C), temperature source Oral, height 4\' 11"  (1.499 m), weight 184 lb (83.5 kg), SpO2 96 %. Body mass index is 37.16 kg/m.  General: Cooperative, alert, well developed, in no acute distress. HEENT: Conjunctivae and lids unremarkable. Cardiovascular: Regular rhythm.  Lungs: Normal work of breathing. Neurologic: No focal deficits.   Lab Results  Component Value Date   CREATININE 0.85 10/03/2019   BUN 16 10/03/2019   NA 145 10/03/2019   K 3.8 10/03/2019   CL 107 10/03/2019   CO2 25 10/03/2019   Lab Results  Component Value Date   ALT 36 10/03/2019   AST 25 10/03/2019   ALKPHOS 39 10/03/2019   BILITOT 0.4 10/03/2019   Lab Results  Component Value Date   HGBA1C 5.6 04/03/2019   HGBA1C 5.6 03/28/2018   Lab Results  Component Value Date   INSULIN 12.6 10/17/2019   Lab Results  Component Value Date   TSH 1.370 10/17/2019   Lab Results  Component Value Date   CHOL 158 04/03/2019   HDL 49.60 04/03/2019   LDLCALC 75 04/03/2019   TRIG 169.0 (H) 04/03/2019   CHOLHDL 3 04/03/2019   Lab Results  Component Value Date   WBC 5.7 12/12/2019  HGB 13.0 12/12/2019   HCT 38.9 12/12/2019   MCV 98 (H) 12/12/2019   PLT 170 12/12/2019   Lab Results  Component Value Date   IRON 120 10/17/2019   TIBC 295 10/17/2019   FERRITIN 206 (H) 12/12/2019   Obesity Behavioral Intervention:   Approximately 15 minutes were spent on the discussion below.  ASK: We discussed the diagnosis of obesity with Laura Bush today and Laura Bush agreed to give [place] permission to discuss obesity behavioral modification therapy today.  ASSESS: Laura Bush has the diagnosis of obesity and her BMI today is 37.3. Laura Bush is in the action stage of change.   ADVISE: Laura Bush was educated on the multiple health risks of obesity as well as the benefit of weight loss to improve her health. She was advised  of the need for long term treatment and the importance of lifestyle modifications to improve her current health and to decrease her risk of future health problems.  AGREE: Multiple dietary modification options and treatment options were discussed and Laura Bush agreed to follow the recommendations documented in the above note.  ARRANGE: Laura Bush was educated on the importance of frequent visits to treat obesity as outlined per CMS and USPSTF guidelines and agreed to schedule her next follow up appointment today.  Attestation Statements:   Reviewed by clinician on day of visit: allergies, medications, problem list, medical history, surgical history, family history, social history, and previous encounter notes.  I, Water quality scientist, CMA, am acting as Location manager for [employer], DO.  I have reviewed the above documentation for accuracy and completeness, and I agree with the above. Briscoe Deutscher, DO

## 2019-12-13 LAB — CBC WITH DIFFERENTIAL/PLATELET
Basophils Absolute: 0 10*3/uL (ref 0.0–0.2)
Basos: 0 %
EOS (ABSOLUTE): 0.2 10*3/uL (ref 0.0–0.4)
Eos: 3 %
Hematocrit: 38.9 % (ref 34.0–46.6)
Hemoglobin: 13 g/dL (ref 11.1–15.9)
Immature Grans (Abs): 0 10*3/uL (ref 0.0–0.1)
Immature Granulocytes: 0 %
Lymphocytes Absolute: 1.6 10*3/uL (ref 0.7–3.1)
Lymphs: 29 %
MCH: 32.8 pg (ref 26.6–33.0)
MCHC: 33.4 g/dL (ref 31.5–35.7)
MCV: 98 fL — ABNORMAL HIGH (ref 79–97)
Monocytes Absolute: 0.6 10*3/uL (ref 0.1–0.9)
Monocytes: 11 %
Neutrophils Absolute: 3.3 10*3/uL (ref 1.4–7.0)
Neutrophils: 57 %
Platelets: 170 10*3/uL (ref 150–450)
RBC: 3.96 x10E6/uL (ref 3.77–5.28)
RDW: 11.8 % (ref 11.7–15.4)
WBC: 5.7 10*3/uL (ref 3.4–10.8)

## 2019-12-13 LAB — FERRITIN: Ferritin: 206 ng/mL — ABNORMAL HIGH (ref 15–150)

## 2019-12-19 ENCOUNTER — Other Ambulatory Visit (INDEPENDENT_AMBULATORY_CARE_PROVIDER_SITE_OTHER): Payer: Self-pay | Admitting: *Deleted

## 2019-12-19 ENCOUNTER — Encounter (INDEPENDENT_AMBULATORY_CARE_PROVIDER_SITE_OTHER): Payer: Self-pay | Admitting: Family Medicine

## 2019-12-19 DIAGNOSIS — E8881 Metabolic syndrome: Secondary | ICD-10-CM

## 2019-12-19 MED ORDER — METFORMIN HCL 500 MG PO TABS: 500.0000 mg | ORAL_TABLET | Freq: Every day | ORAL | 0 refills | Status: DC

## 2020-01-08 ENCOUNTER — Encounter (INDEPENDENT_AMBULATORY_CARE_PROVIDER_SITE_OTHER): Payer: Self-pay | Admitting: Family Medicine

## 2020-01-08 ENCOUNTER — Ambulatory Visit (INDEPENDENT_AMBULATORY_CARE_PROVIDER_SITE_OTHER): Payer: Medicare HMO | Admitting: Family Medicine

## 2020-01-08 ENCOUNTER — Other Ambulatory Visit: Payer: Self-pay

## 2020-01-08 VITALS — BP 98/63 | HR 54 | Temp 98.4°F | Ht 59.0 in | Wt 182.0 lb

## 2020-01-08 DIAGNOSIS — E8881 Metabolic syndrome: Secondary | ICD-10-CM | POA: Diagnosis not present

## 2020-01-08 DIAGNOSIS — I10 Essential (primary) hypertension: Secondary | ICD-10-CM | POA: Diagnosis not present

## 2020-01-08 DIAGNOSIS — Z6836 Body mass index (BMI) 36.0-36.9, adult: Secondary | ICD-10-CM | POA: Diagnosis not present

## 2020-01-08 MED ORDER — METFORMIN HCL 500 MG PO TABS: 500.0000 mg | ORAL_TABLET | Freq: Every day | ORAL | 0 refills | Status: DC

## 2020-01-08 NOTE — Progress Notes (Signed)
Chief Complaint:   OBESITY Laura Bush is here to discuss her progress with her obesity treatment plan along with follow-up of her obesity related diagnoses. Laura Bush is on the [employer] and states she is following her eating plan approximately 95% of the time. Laura Bush states she is walking for 45 minutes 5 times per week  She is also doing yoga two times per week.  Today's visit was #: 5 Starting weight: 196 lbs Starting date: 10/17/2019 Today's weight: 182 lbs Today's date: 01/08/2020 Total lbs lost to date: 14 lbs Total lbs lost since last in-office visit: 2 lbs  Interim History: Laura Bush is down 14 pounds today.  She reports that she has been sleeping 8 hours at night and it is good sleep.  She denies polyphagia.  She says she is tolerating metformin well.  Subjective:   1. Essential hypertension Review: taking medications as instructed, no medication side effects noted, no chest pain on exertion, no dyspnea on exertion, no swelling of ankles.  Blood pressure is low today.  She stopped HCTZ for about 1 week and had increased edema.  BP Readings from Last 3 Encounters:  01/08/20 98/63  12/12/19 100/67  11/20/19 114/70   2. Insulin resistance Laura Bush has a diagnosis of insulin resistance based on her elevated fasting insulin level >5. She continues to work on diet and exercise to decrease her risk of diabetes.  Lab Results  Component Value Date   INSULIN 12.6 10/17/2019   Lab Results  Component Value Date   HGBA1C 5.6 04/03/2019   3. Metabolic syndrome Risk factors (3 or more constitute Metabolic Syndrome): waist > 35" for female impaired fasting blood sugar on blood pressure medication on cholesterol medication.  Assessment/Plan:   1. Essential hypertension Laura Bush is working on healthy weight loss and exercise to improve blood pressure control. We will watch for signs of hypotension as she continues her lifestyle modifications.  She will decrease telmisartan to 40 mg  daily.  2. Insulin resistance Laura Bush will continue to work on weight loss, exercise, and decreasing simple carbohydrates to help decrease the risk of diabetes. Laura Bush agreed to follow-up with [place] as directed to closely monitor her progress.  Orders - metFORMIN (GLUCOPHAGE) 500 MG tablet; Take 1 tablet (500 mg total) by mouth daily.  Dispense: 90 tablet; Refill: 0  3. Metabolic syndrome Counseling: Intensive lifestyle modifications are the first line treatment for this issue. We discussed several lifestyle modifications today and she will continue to work on diet, exercise and weight loss efforts. We will continue to monitor. Orders and follow up as documented in patient record.  Counseling  Metabolic syndrome is a combination of conditions such as abdominal obesity, high blood sugar, high blood pressure, and unhealthy levels of cholesterol. Metabolic syndrome raises your risk of developing heart disease, diabetes, and stroke.  Following a heart-healthy diet can help you lower your risk of heart disease and improve metabolic syndrome. This diet includes lean proteins, healthy fats, nuts, and plenty of fruits and vegetables.  Along with regular exercise, following a healthy diet can help your body use insulin better and help you lose weight.  A common goal for people with metabolic syndrome is to lose 7-10% of their starting weight.  4. Class 2 severe obesity with serious comorbidity and body mass index (BMI) of 36.0 to 36.9 in adult, unspecified obesity type Laura Bush) Laura Bush is currently in the action stage of change. As such, her goal is to continue with weight loss efforts. She  has agreed to the [employer].   Exercise goals: For substantial health benefits, adults should do at least 150 minutes (2 hours and 30 minutes) a week of moderate-intensity, or 75 minutes (1 hour and 15 minutes) a week of vigorous-intensity aerobic physical activity, or an equivalent combination of moderate- and  vigorous-intensity aerobic activity. Aerobic activity should be performed in episodes of at least 10 minutes, and preferably, it should be spread throughout the week.  Behavioral modification strategies: increasing lean protein intake and increasing water intake.  Laura Bush has agreed to follow-up with our clinic in 2 weeks. She was informed of the importance of frequent follow-up visits to maximize her success with intensive lifestyle modifications for her multiple health conditions.   Objective:   Blood pressure 98/63, pulse (!) 54, temperature 98.4 F (36.9 C), temperature source Oral, height 4\' 11"  (1.499 m), weight 182 lb (82.6 kg), SpO2 95 %. Body mass index is 36.76 kg/m.  General: Cooperative, alert, well developed, in no acute distress. HEENT: Conjunctivae and lids unremarkable. Cardiovascular: Regular rhythm.  Lungs: Normal work of breathing. Neurologic: No focal deficits.   Lab Results  Component Value Date   CREATININE 0.85 10/03/2019   BUN 16 10/03/2019   NA 145 10/03/2019   K 3.8 10/03/2019   CL 107 10/03/2019   CO2 25 10/03/2019   Lab Results  Component Value Date   ALT 36 10/03/2019   AST 25 10/03/2019   ALKPHOS 39 10/03/2019   BILITOT 0.4 10/03/2019   Lab Results  Component Value Date   HGBA1C 5.6 04/03/2019   HGBA1C 5.6 03/28/2018   Lab Results  Component Value Date   INSULIN 12.6 10/17/2019   Lab Results  Component Value Date   TSH 1.370 10/17/2019   Lab Results  Component Value Date   CHOL 158 04/03/2019   HDL 49.60 04/03/2019   LDLCALC 75 04/03/2019   TRIG 169.0 (H) 04/03/2019   CHOLHDL 3 04/03/2019   Lab Results  Component Value Date   WBC 5.7 12/12/2019   HGB 13.0 12/12/2019   HCT 38.9 12/12/2019   MCV 98 (H) 12/12/2019   PLT 170 12/12/2019   Lab Results  Component Value Date   IRON 120 10/17/2019   TIBC 295 10/17/2019   FERRITIN 206 (H) 12/12/2019    Obesity Behavioral Intervention:   Approximately 15 minutes were spent on  the discussion below.  ASK: We discussed the diagnosis of obesity with Laura Bush today and Laura Bush agreed to give [place] permission to discuss obesity behavioral modification therapy today.  ASSESS: Laura Bush has the diagnosis of obesity and her BMI today is 36.9. Laura Bush is in the action stage of change.   ADVISE: Laura Bush was educated on the multiple health risks of obesity as well as the benefit of weight loss to improve her health. She was advised of the need for long term treatment and the importance of lifestyle modifications to improve her current health and to decrease her risk of future health problems.  AGREE: Multiple dietary modification options and treatment options were discussed and Laura Bush agreed to follow the recommendations documented in the above note.  ARRANGE: Laura Bush was educated on the importance of frequent visits to treat obesity as outlined per CMS and USPSTF guidelines and agreed to schedule her next follow up appointment today.  Attestation Statements:   Reviewed by clinician on day of visit: allergies, medications, problem list, medical history, surgical history, family history, social history, and previous encounter notes.  I, Water quality scientist, CMA, am  acting as Location manager for [employer], DO.  I have reviewed the above documentation for accuracy and completeness, and I agree with the above. Briscoe Deutscher, DO

## 2020-01-20 DIAGNOSIS — I10 Essential (primary) hypertension: Secondary | ICD-10-CM

## 2020-01-22 MED ORDER — HYDROCHLOROTHIAZIDE 25 MG PO TABS: 12.5000 mg | ORAL_TABLET | Freq: Every day | ORAL | 3 refills | Status: DC

## 2020-01-22 MED ORDER — TELMISARTAN 80 MG PO TABS: 40.0000 mg | ORAL_TABLET | Freq: Every day | ORAL | 3 refills | Status: DC

## 2020-01-22 NOTE — Telephone Encounter (Signed)
Spoke with pt, aware of dr Blenda Mounts recommendations. Follow up scheduled with pharm md. She will track her bp and bring the list and cuff to follow up appointment.

## 2020-01-22 NOTE — Telephone Encounter (Signed)
Skeet Latch, MD It is great she is losing weight. Please adjust the telmisartan dose in Epic and have her reduce HCTZ to 12.5mg . Keep tracking BP and follow up with PharmD or APP in 2-4 weeks.

## 2020-02-05 ENCOUNTER — Other Ambulatory Visit: Payer: Self-pay

## 2020-02-05 ENCOUNTER — Encounter (INDEPENDENT_AMBULATORY_CARE_PROVIDER_SITE_OTHER): Payer: Self-pay | Admitting: Family Medicine

## 2020-02-05 ENCOUNTER — Ambulatory Visit (INDEPENDENT_AMBULATORY_CARE_PROVIDER_SITE_OTHER): Payer: Medicare HMO | Admitting: Family Medicine

## 2020-02-05 VITALS — BP 115/78 | HR 51 | Temp 98.2°F | Ht 59.0 in | Wt 180.0 lb

## 2020-02-05 DIAGNOSIS — Z853 Personal history of malignant neoplasm of breast: Secondary | ICD-10-CM

## 2020-02-05 DIAGNOSIS — I10 Essential (primary) hypertension: Secondary | ICD-10-CM

## 2020-02-05 DIAGNOSIS — Z6836 Body mass index (BMI) 36.0-36.9, adult: Secondary | ICD-10-CM

## 2020-02-05 DIAGNOSIS — E8881 Metabolic syndrome: Secondary | ICD-10-CM

## 2020-02-05 MED ORDER — CARVEDILOL 12.5 MG PO TABS: 6.2500 mg | ORAL_TABLET | Freq: Two times a day (BID) | ORAL | 0 refills | Status: DC

## 2020-02-05 MED ORDER — METFORMIN HCL 500 MG PO TABS: 500.0000 mg | ORAL_TABLET | Freq: Two times a day (BID) | ORAL | 0 refills | Status: DC

## 2020-02-05 NOTE — Progress Notes (Signed)
Chief Complaint:   OBESITY Laura Bush is here to discuss her progress with her obesity treatment plan along with follow-up of her obesity related diagnoses. Laura Bush is on the [employer] and states she is following her eating plan approximately 95% of the time. Laura Bush states she is walking 1.5 miles 5 times per week.  Today's visit was #: 6 Starting weight: 196 lbs Starting date: 10/17/2019 Today's weight: 180 lbs Today's date: 02/05/2020 Total lbs lost to date: 16 lbs Total lbs lost since last in-office visit: 2 lbs  Interim History: Laura Bush is doing well on the plan.    Subjective:   1. Insulin resistance Laura Bush has a diagnosis of insulin resistance based on her elevated fasting insulin level >5. She continues to work on diet and exercise to decrease her risk of diabetes.  Lab Results  Component Value Date   INSULIN 12.6 10/17/2019   Lab Results  Component Value Date   HGBA1C 5.6 04/03/2019   2. Essential hypertension Review: taking medications as instructed, no medication side effects noted, no chest pain on exertion, no dyspnea on exertion, no swelling of ankles.  Heart rate is 51 today.  BP Readings from Last 3 Encounters:  02/05/20 115/78  01/08/20 98/63  12/12/19 100/67   3. History of breast cancer She is currently taking tamoxifen but stops it in November!  Assessment/Plan:   1. Insulin resistance Laura Bush will continue to work on weight loss, exercise, and decreasing simple carbohydrates to help decrease the risk of diabetes. Laura Bush agreed to follow-up with [place] as directed to closely monitor her progress.  Will increase metformin to twice daily dosing, as per below.  Orders - metFORMIN (GLUCOPHAGE) 500 MG tablet; Take 1 tablet (500 mg total) by mouth 2 (two) times daily.  Dispense: 60 tablet; Refill: 0  2. Essential hypertension Laura Bush is working on healthy weight loss and exercise to improve blood pressure control. We will watch for signs of hypotension as  she continues her lifestyle modifications.  Recommend decreasing carvedilol to 1/2 tablet twice daily.  She will see the pharmacist.  Orders - carvedilol (COREG) 12.5 MG tablet; Take 0.5 tablets (6.25 mg total) by mouth 2 (two) times daily with a meal.  Dispense: 30 tablet; Refill: 0  3. History of breast cancer Will continue to monitor.  4. Class 2 severe obesity with serious comorbidity and body mass index (BMI) of 36.0 to 36.9 in adult, unspecified obesity type [facility]) Laura Bush is currently in the action stage of change. As such, her goal is to continue with weight loss efforts. She has agreed to the [employer].   Exercise goals: For substantial health benefits, adults should do at least 150 minutes (2 hours and 30 minutes) a week of moderate-intensity, or 75 minutes (1 hour and 15 minutes) a week of vigorous-intensity aerobic physical activity, or an equivalent combination of moderate- and vigorous-intensity aerobic activity. Aerobic activity should be performed in episodes of at least 10 minutes, and preferably, it should be spread throughout the week.  Behavioral modification strategies: increasing lean protein intake and decreasing simple carbohydrates.  Laura Bush has agreed to follow-up with our clinic in 3 weeks. She was informed of the importance of frequent follow-up visits to maximize her success with intensive lifestyle modifications for her multiple health conditions.   Objective:   Blood pressure 115/78, pulse (!) 51, temperature 98.2 F (36.8 C), temperature source Oral, height 4\' 11"  (1.499 m), weight 180 lb (81.6 kg), SpO2 95 %. Body mass index  is 36.36 kg/m.  General: Cooperative, alert, well developed, in no acute distress. HEENT: Conjunctivae and lids unremarkable. Cardiovascular: Regular rhythm.  Lungs: Normal work of breathing. Neurologic: No focal deficits.   Lab Results  Component Value Date   CREATININE 0.85 10/03/2019   BUN 16 10/03/2019   NA 145 10/03/2019    K 3.8 10/03/2019   CL 107 10/03/2019   CO2 25 10/03/2019   Lab Results  Component Value Date   ALT 36 10/03/2019   AST 25 10/03/2019   ALKPHOS 39 10/03/2019   BILITOT 0.4 10/03/2019   Lab Results  Component Value Date   HGBA1C 5.6 04/03/2019   HGBA1C 5.6 03/28/2018   Lab Results  Component Value Date   INSULIN 12.6 10/17/2019   Lab Results  Component Value Date   TSH 1.370 10/17/2019   Lab Results  Component Value Date   CHOL 158 04/03/2019   HDL 49.60 04/03/2019   LDLCALC 75 04/03/2019   TRIG 169.0 (H) 04/03/2019   CHOLHDL 3 04/03/2019   Lab Results  Component Value Date   WBC 5.7 12/12/2019   HGB 13.0 12/12/2019   HCT 38.9 12/12/2019   MCV 98 (H) 12/12/2019   PLT 170 12/12/2019   Lab Results  Component Value Date   IRON 120 10/17/2019   TIBC 295 10/17/2019   FERRITIN 206 (H) 12/12/2019   Obesity Behavioral Intervention:   Approximately 15 minutes were spent on the discussion below.  ASK: We discussed the diagnosis of obesity with Laura Bush today and Laura Bush agreed to give [place] permission to discuss obesity behavioral modification therapy today.  ASSESS: Laura Bush has the diagnosis of obesity and her BMI today is 36.5. Laura Bush is in the action stage of change.   ADVISE: Laura Bush was educated on the multiple health risks of obesity as well as the benefit of weight loss to improve her health. She was advised of the need for long term treatment and the importance of lifestyle modifications to improve her current health and to decrease her risk of future health problems.  AGREE: Multiple dietary modification options and treatment options were discussed and Laura Bush agreed to follow the recommendations documented in the above note.  ARRANGE: Laura Bush was educated on the importance of frequent visits to treat obesity as outlined per CMS and USPSTF guidelines and agreed to schedule her next follow up appointment today.  Attestation Statements:   Reviewed by clinician  on day of visit: allergies, medications, problem list, medical history, surgical history, family history, social history, and previous encounter notes.  I, Water quality scientist, CMA, am acting as transcriptionist for Briscoe Deutscher, DO  I have reviewed the above documentation for accuracy and completeness, and I agree with the above. Briscoe Deutscher, DO

## 2020-02-12 ENCOUNTER — Ambulatory Visit: Payer: Medicare HMO | Admitting: Internal Medicine

## 2020-02-12 ENCOUNTER — Encounter: Payer: Self-pay | Admitting: Internal Medicine

## 2020-02-12 VITALS — BP 114/68 | HR 60 | Ht 59.0 in | Wt 183.0 lb

## 2020-02-12 DIAGNOSIS — K52832 Lymphocytic colitis: Secondary | ICD-10-CM

## 2020-02-12 DIAGNOSIS — K219 Gastro-esophageal reflux disease without esophagitis: Secondary | ICD-10-CM | POA: Diagnosis not present

## 2020-02-12 DIAGNOSIS — Z1211 Encounter for screening for malignant neoplasm of colon: Secondary | ICD-10-CM | POA: Diagnosis not present

## 2020-02-12 DIAGNOSIS — K222 Esophageal obstruction: Secondary | ICD-10-CM

## 2020-02-12 DIAGNOSIS — R1310 Dysphagia, unspecified: Secondary | ICD-10-CM | POA: Diagnosis not present

## 2020-02-12 MED ORDER — PANTOPRAZOLE SODIUM 40 MG PO TBEC: 40.0000 mg | DELAYED_RELEASE_TABLET | Freq: Every day | ORAL | 3 refills | Status: DC

## 2020-02-12 MED ORDER — SUTAB 1479-225-188 MG PO TABS: 1.0000 | ORAL_TABLET | Freq: Once | ORAL | 0 refills | Status: AC

## 2020-02-12 NOTE — Progress Notes (Signed)
HISTORY OF PRESENT ILLNESS:  Laura Bush is a 69 y.o. female with a remote history of lymphocytic colitis (diagnosed in 2008 at the [facility] colonoscopy report and biopsies reviewed).  She also has a history of chronic GERD seeing for progressive intermittent solid food dysphagia.  For this she underwent upper endoscopy with esophageal dilation October 2020.  She was found to have a distal esophageal stricture and sliding hernia.  Post dilation she has been maintained on pantoprazole 40 mg daily.  She presents today for follow-up.  Patient is pleased to report that she is experiencing no reflux symptoms on pantoprazole except for possibly 1 occasion.  She has been dieting and successfully lost 12 pounds.  She has had no recurrent dysphagia.  Tolerating medication well.  She is pleased.  She brings up today the need for colon cancer screening.  She has not had optical colonoscopy since her examination 2008.  She did undergo Cologuard testing several years ago which was negative.  She had a very close friend recently died of colon cancer.  She is interested in optical colonoscopy.  She has no active lower GI complaints such as bowel habit change or bleeding.  Review of laboratories from Dec 12, 2019 shows normal hemoglobin 13.0  REVIEW OF SYSTEMS:  All non-GI ROS negative unless otherwise stated in the HPI.  Past Medical History:  Diagnosis Date  . Asthma    in cold weather  . Breast cancer ([place]) 09/18/14   left breast  . Breast cancer ([place]) 10/07/14   bx left breast  . Breast cancer of upper-outer quadrant of left female breast ([place]) 09/22/2014  . Cluster headaches    in her 40's  . GERD (gastroesophageal reflux disease)   . Heart murmur    as a child (has outgrown)  . High cholesterol   . Hypertension   . Lower extremity edema   . Microscopic colitis   . Morbid obesity ([place]) 09/30/2019  . OSA on CPAP   . Personal history of radiation therapy   . Pneumonia  2000's X 1  . PONV (postoperative nausea and vomiting)   . Rosacea   . S/P radiation therapy 12/30/14-01/27/15   left breast 50Gy total dose  . 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 Swallowing difficulty   . Venous insufficiency     Past Surgical History:  Procedure Laterality Date  . ABDOMINAL HYSTERECTOMY  1999  . APPENDECTOMY  ~ 2006  . BREAST BIOPSY Left 10/2014  . BREAST LUMPECTOMY Left 2016  . BREAST REDUCTION SURGERY Bilateral 11/11/2014   Procedure: Bilateral Breast Reduction;  Surgeon: Crissie Reese, MD;  Location: [facility];  Service: Plastics;  Laterality: Bilateral;  . CARPAL TUNNEL RELEASE Bilateral    2 surgeries on right, 1 on left  . CESAREAN SECTION  1978; 1980  . COLONOSCOPY    . FOREARM FRACTURE SURGERY Right 2003 X 3  . KNEE ARTHROSCOPY Bilateral    meniscus repair  . PARTIAL MASTECTOMY WITH NEEDLE LOCALIZATION AND AXILLARY SENTINEL LYMPH NODE BX Left 11/11/2014   Procedure: LEFT BREAST PARTIAL MASTECTOMY WITH NEEDLE LOCALIZATION TIMES TWO AND LEFT AXILLARY SENTINEL LYMPH NODE Biopsy;  Surgeon: Fanny Skates, MD;  Location: [facility];  Service: General;  Laterality: Left;  . SQUAMOUS CELL CARCINOMA EXCISION  1980's X 1   "nose"  . TONSILLECTOMY  ~ 1957  . TUBAL LIGATION  ?1984    Social History Laura Bush  reports that she has never smoked. She  has never used smokeless tobacco. She reports current alcohol use of about 5.0 standard drinks of alcohol per week. She reports that she does not use drugs.  family history includes Dementia in her mother; Heart attack in her maternal grandmother; Heart failure in her father; Hyperlipidemia in her mother; Hypertension in her father, maternal grandfather, and mother; Stroke in her father and paternal grandfather; Uterine cancer (age of onset: 52) in her sister.  Allergies  Allergen Reactions  . Amlodipine Swelling  . Lanolin   . Tape Other (See Comments)    Redness (Bandaids also)       PHYSICAL  EXAMINATION: Vital signs: BP 114/68   Pulse 60   Ht 4\' 11"  (1.499 m)   Wt 183 lb (83 kg)   BMI 36.96 kg/m   Constitutional: generally well-appearing, no acute distress Psychiatric: alert and oriented x3, cooperative Eyes: extraocular movements intact, anicteric, conjunctiva pink Mouth: oral pharynx moist, no lesions Neck: supple no lymphadenopathy Cardiovascular: heart regular rate and rhythm, no murmur Lungs: clear to auscultation bilaterally Abdomen: soft, nontender, nondistended, no obvious ascites, no peritoneal signs, normal bowel sounds, no organomegaly Rectal: Deferred until colonoscopy Extremities: no clubbing, cyanosis, or lower extremity edema bilaterally Skin: no lesions on visible extremities Neuro: No focal deficits.  Cranial nerves intact  ASSESSMENT:  1.  GERD complicated by peptic stricture.  Asymptomatic post dilation (October 2020) on PPI (pantoprazole 40 mg daily). 2.  Colon cancer screening.  Negative for neoplasia colonoscopy 2008.  Negative Cologuard testing several years ago.  Request optical colonoscopy. 3.  History of lymphocytic colitis diagnosed with [place] 2008.  Asymptomatic at this time requiring no particular medical therapy 4.  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  Reflux precautions 2.  Ongoing weight loss.  Patient was complemented on her good efforts thus far 3.  Continue PPI daily.  Prescription refills.  Medication risks reviewed 4.  Monitor for breakthrough reflux symptoms or recurrent dysphagia 5.  Schedule colonoscopy for colon cancer screening.The nature of the procedure, as well as the risks, benefits, and alternatives were carefully and thoroughly reviewed with the patient. Ample time for discussion and questions allowed. The patient understood, was satisfied, and agreed to proceed. 6.  Hold Metformin preprocedure Total time of 30 minutes was spent in see the  patient, reviewing previous tests and procedures, obtaining interval history, physical examination, counseling the patient regarding the above listed issues, ordering medications and procedures, documenting clinical information in health record

## 2020-02-12 NOTE — Patient Instructions (Signed)
You have been scheduled for a colonoscopy. Please follow written instructions given to you at your visit today.  Please pick up your prep supplies at the pharmacy within the next 1-3 days. If you use inhalers (even only as needed), please bring them with you on the day of your procedure.  We have sent the following medications to your pharmacy for you to pick up at your convenience:  Protonix

## 2020-02-20 ENCOUNTER — Ambulatory Visit: Payer: Medicare HMO

## 2020-02-20 DIAGNOSIS — R69 Illness, unspecified: Secondary | ICD-10-CM | POA: Diagnosis not present

## 2020-02-25 ENCOUNTER — Other Ambulatory Visit: Payer: Self-pay

## 2020-02-25 ENCOUNTER — Ambulatory Visit: Payer: Medicare HMO | Admitting: Pharmacist

## 2020-02-25 VITALS — BP 118/68 | HR 60 | Ht 59.0 in | Wt 185.4 lb

## 2020-02-25 DIAGNOSIS — I10 Essential (primary) hypertension: Secondary | ICD-10-CM

## 2020-02-25 MED ORDER — CARVEDILOL 6.25 MG PO TABS: ORAL_TABLET | ORAL | 1 refills | Status: DC

## 2020-02-25 NOTE — Progress Notes (Signed)
Patient ID: Laura Bush                 DOB: 01-01-1951                      MRN: [id number]     HPI: [name] is a 69 y.o. female referred by Dr. Oval Linsey to HTN clinic. PMH includes hypertension, venous insufficiency, GERD, hypercholesterolemia, and morbid obesity.  She is currently enrolled in the Healthy Weigh and wellness management for and weight loss caused decreased in BP medication needs. On June/14 her telmisartan and HCTZ doses were decreased by Dr Oval Linsey. She presents to clinic for assessment and further medication adjustment as needed.  Current HTN meds:  Carvedilol 6.25mg  twice daily HCTZ 12.5mg  daily  telmisartan 40mg  daily   Intoletance:  Amlodipine - swelling  BP goal: <130/80  Family History: The patient's family history includes Dementia in her mother; Heart attack in her maternal grandmother; Heart failure in her father; Hyperlipidemia in her mother; Hypertension in her maternal grandfather and mother; Stroke in her father and paternal grandfather; Uterine cancer (age of onset: 80) in her sister.   Social History: The patient  reports that she has never smoked. She has never used smokeless tobacco. She reports current alcohol use of about 5.0 standard drinks of alcohol per week. She reports that she does not use drugs  Diet: pescatarian diet per healthy weight and wellness program  Exercise: walks 2 miles 5x/week, and yoga few time per week  Home BP readings: range 97/64 - 134/84 per patient's history 5 readings provided; average 118/73; range 54-61 bpm  Wt Readings from Last 3 Encounters:  02/25/20 185 lb 6.4 oz (84.1 kg)  02/12/20 183 lb (83 kg)  02/05/20 180 lb (81.6 kg)   BP Readings from Last 3 Encounters:  02/25/20 118/68  02/12/20 114/68  02/05/20 115/78   Pulse Readings from Last 3 Encounters:  02/25/20 60  02/12/20 60  02/05/20 (!) 51    Past Medical History:  Diagnosis Date  . Asthma    in cold weather  . Breast cancer  ([facility]) 09/18/14   left breast  . Breast cancer ([facility]) 10/07/14   bx left breast  . Breast cancer of upper-outer quadrant of left female breast ([facility]) 09/22/2014  . Cluster headaches    in her 40's  . GERD (gastroesophageal reflux disease)   . Heart murmur    as a child (has outgrown)  . High cholesterol   . Hypertension   . Lower extremity edema   . Microscopic colitis   . Morbid obesity ([place]) 09/30/2019  . OSA on CPAP   . Personal history of radiation therapy   . Pneumonia 2000's X 1  . PONV (postoperative nausea and vomiting)   . Rosacea   . S/P radiation therapy 12/30/14-01/27/15   left breast 50Gy total dose  . 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 Swallowing difficulty   . Venous insufficiency     Current Outpatient Medications on File Prior to Visit  Medication Sig Dispense Refill  . aMILoride (MIDAMOR) 5 MG tablet Take 1 tablet (5 mg total) by mouth daily. 90 tablet 3  . B Complex-C (B-COMPLEX WITH VITAMIN C) tablet Take 1 tablet by mouth daily.    Marland Kitchen CALCIUM CARBONATE-VITAMIN D PO Take 1 tablet by mouth 2 (two) times daily. 600mg  calcium, D3 (1000)IU    . Elderberry 575 MG/5ML SYRP Take by mouth daily.    Marland Kitchen  Fexofenadine HCl (ALLEGRA ALLERGY PO) Take by mouth daily.    Marland Kitchen gabapentin (NEURONTIN) 300 MG capsule Take 1 capsule (300 mg total) by mouth at bedtime. 90 capsule 3  . hydrochlorothiazide (MICROZIDE) 12.5 MG capsule Take 1 capsule by mouth daily.    Marland Kitchen loratadine (CLARITIN) 10 MG tablet Take 10 mg by mouth daily.    . metFORMIN (GLUCOPHAGE) 500 MG tablet Take 1 tablet (500 mg total) by mouth 2 (two) times daily. 60 tablet 0  . pantoprazole (PROTONIX) 40 MG tablet Take 1 tablet (40 mg total) by mouth daily. 90 tablet 3  . simvastatin (ZOCOR) 20 MG tablet Take 1 tablet (20 mg total) by mouth daily. 90 tablet 3  . tamoxifen (NOLVADEX) 20 MG tablet Take 1 tablet (20 mg total) by mouth daily. 90 tablet 3  . telmisartan (MICARDIS) 80 MG tablet Take 0.5 tablets (40 mg total) by  mouth daily. 90 tablet 3   No current facility-administered medications on file prior to visit.    Allergies  Allergen Reactions  . Amlodipine Swelling  . Lanolin   . Tape Other (See Comments)    Redness (Bandaids also)    Blood pressure 118/68, pulse 60, height 4\' 11"  (1.499 m), weight 185 lb 6.4 oz (84.1 kg), SpO2 97 %.  Essential hypertension Blood pressure at goal during OV and at home with BP home reading average at 118/73. Patient continues to work on lifestyle modifications, and weight management. Her only complain is occasional low pulse and increased fatigue. Noted HR at home can reach low 50s. But never above 61 bpm.   Will decrease carvedilol dose to 3.125mg  in AM and 6.25mg  in the evening. Will follow up with patient ,by phone, in 2 weeks. Plan to schedule office visit follow up as needed after telephone follow up.    Carmelita Amparo Rodriguez-Guzman PharmD, BCPS, [facility] [address] [place] [address] 03/01/2020 4:05 PM

## 2020-02-25 NOTE — Patient Instructions (Signed)
Return for a follow up appointment AS NEEDED  Check your blood pressure at home daily (if able) and keep record of the readings.  Take your BP meds as follows: *DECRASE carvedilol to 3.125mg  every morning and 6.25mg  every evening  Bring all of your meds, your BP cuff and your record of home blood pressures to your next appointment.  Exercise as youre able, try to walk approximately 30 minutes per day.  Keep salt intake to a minimum, especially watch canned and prepared boxed foods.  Eat more fresh fruits and vegetables and fewer canned items.  Avoid eating in fast food restaurants.    HOW TO TAKE YOUR BLOOD PRESSURE:  Rest 5 minutes before taking your blood pressure.   Dont smoke or drink caffeinated beverages for at least 30 minutes before.  Take your blood pressure before (not after) you eat.  Sit comfortably with your back supported and both feet on the floor (dont cross your legs).  Elevate your arm to heart level on a table or a desk.  Use the proper sized cuff. It should fit smoothly and snugly around your bare upper arm. There should be enough room to slip a fingertip under the cuff. The bottom edge of the cuff should be 1 inch above the crease of the elbow.  Ideally, take 3 measurements at one sitting and record the average.

## 2020-03-01 ENCOUNTER — Encounter (INDEPENDENT_AMBULATORY_CARE_PROVIDER_SITE_OTHER): Payer: Self-pay

## 2020-03-01 ENCOUNTER — Encounter: Payer: Self-pay | Admitting: Pharmacist

## 2020-03-01 NOTE — Assessment & Plan Note (Signed)
Blood pressure at goal during OV and at home with BP home reading average at 118/73. Patient continues to work on lifestyle modifications, and weight management. Her only complain is occasional low pulse and increased fatigue. Noted HR at home can reach low 50s. But never above 61 bpm.   Will decrease carvedilol dose to 3.125mg  in AM and 6.25mg  in the evening. Will follow up with patient ,by phone, in 2 weeks. Plan to schedule office visit follow up as needed after telephone follow up.

## 2020-03-02 ENCOUNTER — Ambulatory Visit (INDEPENDENT_AMBULATORY_CARE_PROVIDER_SITE_OTHER): Payer: Medicare HMO | Admitting: Family Medicine

## 2020-03-07 ENCOUNTER — Other Ambulatory Visit (INDEPENDENT_AMBULATORY_CARE_PROVIDER_SITE_OTHER): Payer: Self-pay | Admitting: Family Medicine

## 2020-03-07 DIAGNOSIS — E8881 Metabolic syndrome: Secondary | ICD-10-CM

## 2020-03-09 ENCOUNTER — Telehealth: Payer: Self-pay

## 2020-03-09 NOTE — Telephone Encounter (Signed)
Received phone call from MRI scheduler in regards to pt reporting she "may" have a contrast allergy.   Called pt and spoke with her directly. Pt reported in January 2021 she had an MRI at 10 AM. That evening after 5pm she noticed her "hands, feet, and ankles were swollen". Pt denies having any itching, hives, coughing, etc... 13 hour prep was not called in for this patient but pt reported she was going to take 25mg  of benadryl 1 hour prior to her appointment time.

## 2020-03-11 ENCOUNTER — Encounter (INDEPENDENT_AMBULATORY_CARE_PROVIDER_SITE_OTHER): Payer: Self-pay

## 2020-03-11 NOTE — Telephone Encounter (Signed)
My chart message sent to pt.

## 2020-03-23 ENCOUNTER — Ambulatory Visit (INDEPENDENT_AMBULATORY_CARE_PROVIDER_SITE_OTHER): Payer: Medicare HMO | Admitting: Family Medicine

## 2020-03-23 ENCOUNTER — Encounter (INDEPENDENT_AMBULATORY_CARE_PROVIDER_SITE_OTHER): Payer: Self-pay | Admitting: Family Medicine

## 2020-03-23 ENCOUNTER — Other Ambulatory Visit: Payer: Self-pay

## 2020-03-23 VITALS — BP 130/76 | HR 56 | Temp 98.0°F | Ht 59.0 in | Wt 177.0 lb

## 2020-03-23 DIAGNOSIS — Z6835 Body mass index (BMI) 35.0-35.9, adult: Secondary | ICD-10-CM | POA: Diagnosis not present

## 2020-03-23 DIAGNOSIS — E8881 Metabolic syndrome: Secondary | ICD-10-CM

## 2020-03-23 DIAGNOSIS — I10 Essential (primary) hypertension: Secondary | ICD-10-CM

## 2020-03-23 MED ORDER — METFORMIN HCL 500 MG PO TABS: 500.0000 mg | ORAL_TABLET | Freq: Two times a day (BID) | ORAL | 0 refills | Status: DC

## 2020-03-23 NOTE — Progress Notes (Signed)
Chief Complaint:   OBESITY Al is here to discuss her progress with her obesity treatment plan along with follow-up of her obesity related diagnoses. Yanet is on the [employer] and states she is following her eating plan approximately 95% of the time. Endya states she is walking and/or doing yoga for 55 minutes 5-6 times per week.  Today's visit was #: 7 Starting weight: 196 lbs Starting date: 10/17/2019 Today's weight: 177 lbs Today's date: 03/23/2020 Total lbs lost to date: 19 lbs Total lbs lost since last in-office visit: 3 lbs Total weight loss percentage to date: -9.69%  Interim History: Ryn says she would like to incorporate chicken into her plan.  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  Assessment/Plan:   1. Essential hypertension Cardiovascular ROS: negative for - chest pain, edema, palpitations or shortness of breath.  BP Readings from Last 3 Encounters:  03/23/20 130/76  02/25/20 118/68  02/12/20 114/68   Lab Results  Component Value Date   CREATININE 0.85 10/03/2019   CREATININE 0.89 08/13/2019   CREATININE 0.85 04/03/2019   Kodie is working on healthy weight loss and exercise to improve blood pressure control. We will watch for signs of hypotension as she continues her lifestyle modifications.  2. Insulin resistance Not optimized. Goal is HgbA1c < 5.7 and insulin level closer to 5. Baani will continue to work on weight loss, exercise, and decreasing simple carbohydrates to help decrease the risk of diabetes. Ruhama agreed to follow-up with [place] as directed to closely monitor her progress.  - metFORMIN (GLUCOPHAGE) 500 MG tablet; Take 1 tablet (500 mg total) by mouth 2 (two) times daily.  Dispense: 180 tablet; Refill: 0  3. Metabolic syndrome Goal: Lose 7-10% of starting weight. Counseling: Intensive lifestyle modifications are the first line treatment for this issue. We discussed several lifestyle modifications today  and she will continue to work on diet, exercise and weight loss efforts.   4. Class 2 severe obesity with serious comorbidity and body mass index (BMI) of 35.0 to 35.9 in adult, unspecified obesity type [facility]) Koya is currently in the action stage of change. As such, her goal is to continue with weight loss efforts. She has agreed to keeping a food journal and adhering to recommended goals of 1000-1200 calories and 85+ grams of protein and the [place].   Exercise goals: For substantial health benefits, adults should do at least 150 minutes (2 hours and 30 minutes) a week of moderate-intensity, or 75 minutes (1 hour and 15 minutes) a week of vigorous-intensity aerobic physical activity, or an equivalent combination of moderate- and vigorous-intensity aerobic activity. Aerobic activity should be performed in episodes of at least 10 minutes, and preferably, it should be spread throughout the week.  Behavioral modification strategies: increasing lean protein intake and decreasing simple carbohydrates.  Lorra has agreed to follow-up with our clinic in 2-3 weeks. She was informed of the importance of frequent follow-up visits to maximize her success with intensive lifestyle modifications for her multiple health conditions.   Objective:   Blood pressure 130/76, pulse (!) 56, temperature 98 F (36.7 C), temperature source Oral, height 4\' 11"  (1.499 m), weight 177 lb (80.3 kg), SpO2 98 %. Body mass index is 35.75 kg/m.  General: Cooperative, alert, well developed, in no acute distress. HEENT: Conjunctivae and lids unremarkable. Cardiovascular: Regular rhythm.  Lungs: Normal work of breathing. Neurologic: No focal deficits.   Lab Results  Component Value Date  CREATININE 0.85 10/03/2019   BUN 16 10/03/2019   NA 145 10/03/2019   K 3.8 10/03/2019   CL 107 10/03/2019   CO2 25 10/03/2019   Lab Results  Component Value Date   ALT 36 10/03/2019   AST 25 10/03/2019   ALKPHOS 39  10/03/2019   BILITOT 0.4 10/03/2019   Lab Results  Component Value Date   HGBA1C 5.6 04/03/2019   HGBA1C 5.6 03/28/2018   Lab Results  Component Value Date   INSULIN 12.6 10/17/2019   Lab Results  Component Value Date   TSH 1.370 10/17/2019   Lab Results  Component Value Date   CHOL 158 04/03/2019   HDL 49.60 04/03/2019   LDLCALC 75 04/03/2019   TRIG 169.0 (H) 04/03/2019   CHOLHDL 3 04/03/2019   Lab Results  Component Value Date   WBC 5.7 12/12/2019   HGB 13.0 12/12/2019   HCT 38.9 12/12/2019   MCV 98 (H) 12/12/2019   PLT 170 12/12/2019   Lab Results  Component Value Date   IRON 120 10/17/2019   TIBC 295 10/17/2019   FERRITIN 206 (H) 12/12/2019   Obesity Behavioral Intervention:   Approximately 15 minutes were spent on the discussion below.  ASK: We discussed the diagnosis of obesity with [name] today and Elliyah agreed to give [place] permission to discuss obesity behavioral modification therapy today.  ASSESS: Cam has the diagnosis of obesity and her BMI today is 35.7. Dreanna is in the action stage of change.   ADVISE: Corvette was educated on the multiple health risks of obesity as well as the benefit of weight loss to improve her health. She was advised of the need for long term treatment and the importance of lifestyle modifications to improve her current health and to decrease her risk of future health problems.  AGREE: Multiple dietary modification options and treatment options were discussed and Wrenna agreed to follow the recommendations documented in the above note.  ARRANGE: Sissi was educated on the importance of frequent visits to treat obesity as outlined per CMS and USPSTF guidelines and agreed to schedule her next follow up appointment today.  Attestation Statements:   Reviewed by clinician on day of visit: allergies, medications, problem list, medical history, surgical history, family history, social history, and previous encounter notes.  I,  Water quality scientist, CMA, am acting as transcriptionist for Briscoe Deutscher, DO  I have reviewed the above documentation for accuracy and completeness, and I agree with the above. Briscoe Deutscher, DO

## 2020-03-31 ENCOUNTER — Encounter: Payer: Self-pay | Admitting: Internal Medicine

## 2020-04-03 ENCOUNTER — Other Ambulatory Visit (INDEPENDENT_AMBULATORY_CARE_PROVIDER_SITE_OTHER): Payer: Self-pay | Admitting: Family Medicine

## 2020-04-03 DIAGNOSIS — E8881 Metabolic syndrome: Secondary | ICD-10-CM

## 2020-04-05 NOTE — Patient Instructions (Signed)
It was good to see you again today, I will be in touch your labs as soon as possible [place] job with exercise and weight control!   Decrease your evening dose of carvedilol as well.  If your BP continues to run a bit too low we can plan to stop this medication   Please see me in about 6 months, take care    Health Maintenance After Age 69 After age 29, you are at a higher risk for certain long-term diseases and infections as well as injuries from falls. Falls are a major cause of broken bones and head injuries in people who are older than age 70. Getting regular preventive care can help to keep you healthy and well. Preventive care includes getting regular testing and making lifestyle changes as recommended by your health care provider. Talk with your health care provider about:  Which screenings and tests you should have. A screening is a test that checks for a disease when you have no symptoms.  A diet and exercise plan that is right for you. What should I know about screenings and tests to prevent falls? Screening and testing are the best ways to find a health problem early. Early diagnosis and treatment give you the best chance of managing medical conditions that are common after age 78. Certain conditions and lifestyle choices may make you more likely to have a fall. Your health care provider may recommend:  Regular vision checks. Poor vision and conditions such as cataracts can make you more likely to have a fall. If you wear glasses, make sure to get your prescription updated if your vision changes.  Medicine review. Work with your health care provider to regularly review all of the medicines you are taking, including over-the-counter medicines. Ask your health care provider about any side effects that may make you more likely to have a fall. Tell your health care provider if any medicines that you take make you feel dizzy or sleepy.  Osteoporosis screening. Osteoporosis is a condition  that causes the bones to get weaker. This can make the bones weak and cause them to break more easily.  Blood pressure screening. Blood pressure changes and medicines to control blood pressure can make you feel dizzy.  Strength and balance checks. Your health care provider may recommend certain tests to check your strength and balance while standing, walking, or changing positions.  Foot health exam. Foot pain and numbness, as well as not wearing proper footwear, can make you more likely to have a fall.  Depression screening. You may be more likely to have a fall if you have a fear of falling, feel emotionally low, or feel unable to do activities that you used to do.  Alcohol use screening. Using too much alcohol can affect your balance and may make you more likely to have a fall. What actions can I take to lower my risk of falls? General instructions  Talk with your health care provider about your risks for falling. Tell your health care provider if: ? You fall. Be sure to tell your health care provider about all falls, even ones that seem minor. ? You feel dizzy, sleepy, or off-balance.  Take over-the-counter and prescription medicines only as told by your health care provider. These include any supplements.  Eat a healthy diet and maintain a healthy weight. A healthy diet includes low-fat dairy products, low-fat (lean) meats, and fiber from whole grains, beans, and lots of fruits and vegetables. Home safety  Remove  any tripping hazards, such as rugs, cords, and clutter.  Install safety equipment such as grab bars in bathrooms and safety rails on stairs.  Keep rooms and walkways well-lit. Activity   Follow a regular exercise program to stay fit. This will help you maintain your balance. Ask your health care provider what types of exercise are appropriate for you.  If you need a cane or walker, use it as recommended by your health care provider.  Wear supportive shoes that have  nonskid soles. Lifestyle  Do not drink alcohol if your health care provider tells you not to drink.  If you drink alcohol, limit how much you have: ? 0-1 drink a day for women. ? 0-2 drinks a day for men.  Be aware of how much alcohol is in your drink. In the [place] one drink equals one typical bottle of beer (12 oz), one-half glass of wine (5 oz), or one shot of hard liquor (1 oz).  Do not use any products that contain nicotine or tobacco, such as cigarettes and e-cigarettes. If you need help quitting, ask your health care provider. Summary  Having a healthy lifestyle and getting preventive care can help to protect your health and wellness after age 43.  Screening and testing are the best way to find a health problem early and help you avoid having a fall. Early diagnosis and treatment give you the best chance for managing medical conditions that are more common for people who are older than age 64.  Falls are a major cause of broken bones and head injuries in people who are older than age 65. Take precautions to prevent a fall at home.  Work with your health care provider to learn what changes you can make to improve your health and wellness and to prevent falls. This information is not intended to replace advice given to you by your health care provider. Make sure you discuss any questions you have with your health care provider. Document Revised: 11/15/2018 Document Reviewed: 06/07/2017 Elsevier Patient Education  2020 [employer].

## 2020-04-05 NOTE — Progress Notes (Addendum)
[facility] [place], [place], [place] [address] [phone]  Date:  04/06/2020   Name:  Laura Bush   DOB:  [birth year]   MRN:  [id number]  PCP:  Darreld Mclean, MD    Chief Complaint: Annual Exam   History of Present Illness:  Laura Bush is a 69 y.o. very pleasant female patient who presents with the following:  Patient here today for follow-up visit and physical exam Last seen by myself in February of this year  She notes that she has been doing well since that time  PMH includes hypertension, venous insufficiency, GERD, hypercholesterolemia, and morbid obesity,  breast cancer diagnosed 2016  She is also being followed by the [facility], following a pescaterian diet currently- also a little chicken   Her gastroenterologist is Dr. Henrene Pastor who is following her for GERD She also sees cardiology, most recent visit in July with cardiology pharmacist for medication review Most recent visit with her oncologist, Dr. Jana Hakim was in February: [name] is now close to 5 years out from her definitive surgery.  There is no evidence of disease activity.  This is very favorable. She will complete 5 years of tamoxifen in December of this year.  I would be comfortable with her stopping at that point. We have been obtaining yearly MRIs because of the unclear margins asked her DCIS right reduction mammoplasty.  However her breast density now is down to B.  I would suggest that after this years MRI, 5 years out from the surgery, we can go back to yearly mammography safely. I will see her again in November of this year and likely that will be her "graduation" visit.  Cologuard now due- she is doing a colonoscopy actually this week Flu vaccine- plan for this fall  Mammogram up-to-date Completed Covid series in March Shingrix is done Pneumonia complete  She is walking about 2 miles a day, at  least 5 days a week   Since she began exercising more and losing weight, Lavender has noted her blood pressure running low sometimes.  She will occasionally feel lightheaded She has already cut her telmisartan to 40 mg  She uses hctz 12.5, prefers to continue this as it also helps with swelling  She is taking carvedilol 3.125 in the am and 6.25 in the pm  Her family is doing well- 48 yo twin grands are starting K virtually  She also has 4 and 2 yo grands in [place]   Pulse Readings from Last 3 Encounters:  04/06/20 97  03/23/20 (!) 56  02/25/20 60     Wt Readings from Last 3 Encounters:  04/06/20 178 lb (80.7 kg)  03/23/20 177 lb (80.3 kg)  02/25/20 185 lb 6.4 oz (84.1 kg)   BP Readings from Last 3 Encounters:  04/06/20 100/62  03/23/20 130/76  02/25/20 118/68     Patient Active Problem List   Diagnosis Date Noted  . Morbid obesity ([facility]) 09/30/2019  . Osteopenia 10/02/2018  . Malignant 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, estrogen receptor positive ([facility]) 05/29/2017  . 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 Vitamin D deficiency 01/29/2016  . Hot flashes due to tamoxifen 10/05/2015  . Eczema 12/29/2014  . Venous insufficiency 10/16/2014  . Essential hypertension 10/01/2014  . Hypercholesteremia 10/01/2014  . GERD (gastroesophageal reflux disease) 10/01/2014  . Other bilateral bundle branch  block 06/23/2003    Past Medical History:  Diagnosis Date  . Asthma    in cold weather  . Breast cancer ([facility]) 09/18/14   left breast  . Breast cancer ([facility]) 10/07/14   bx left breast  . Breast cancer of upper-outer quadrant of left female breast ([facility]) 09/22/2014  . Cluster headaches    in her 40's  . GERD (gastroesophageal reflux disease)   . Heart murmur    as a child (has outgrown)  . High cholesterol   . Hypertension   . Lower extremity edema   . Microscopic colitis   . Morbid obesity ([facility]) 09/30/2019  . OSA on CPAP   . Personal  history of radiation therapy   . Pneumonia 2000's X 1  . PONV (postoperative nausea and vomiting)   . Rosacea   . S/P radiation therapy 12/30/14-01/27/15   left breast 50Gy total dose  . 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 Swallowing difficulty   . Venous insufficiency     Past Surgical History:  Procedure Laterality Date  . ABDOMINAL HYSTERECTOMY  1999  . APPENDECTOMY  ~ 2006  . BREAST BIOPSY Left 10/2014  . BREAST LUMPECTOMY Left 2016  . BREAST REDUCTION SURGERY Bilateral 11/11/2014   Procedure: Bilateral Breast Reduction;  Surgeon: Crissie Reese, MD;  Location: [facility];  Service: Plastics;  Laterality: Bilateral;  . CARPAL TUNNEL RELEASE Bilateral    2 surgeries on right, 1 on left  . CESAREAN SECTION  1978; 1980  . COLONOSCOPY    . FOREARM FRACTURE SURGERY Right 2003 X 3  . KNEE ARTHROSCOPY Bilateral    meniscus repair  . PARTIAL MASTECTOMY WITH NEEDLE LOCALIZATION AND AXILLARY SENTINEL LYMPH NODE BX Left 11/11/2014   Procedure: LEFT BREAST PARTIAL MASTECTOMY WITH NEEDLE LOCALIZATION TIMES TWO AND LEFT AXILLARY SENTINEL LYMPH NODE Biopsy;  Surgeon: Fanny Skates, MD;  Location: [facility];  Service: General;  Laterality: Left;  . SQUAMOUS CELL CARCINOMA EXCISION  1980's X 1   "nose"  . TONSILLECTOMY  ~ 1957  . TUBAL LIGATION  ?1984    Social History   Tobacco Use  . Smoking status: Never Smoker  . Smokeless tobacco: Never Used  Substance Use Topics  . Alcohol use: Yes    Alcohol/week: 5.0 standard drinks    Types: 5 Glasses of wine per week  . Drug use: No    Family History  Problem Relation Age of Onset  . Uterine cancer Sister 52  . Stroke Father        deceased  . Heart failure Father   . Hypertension Father   . Dementia Mother        Lives in [place]  . Hypertension Mother   . Hyperlipidemia Mother   . Heart attack Maternal Grandmother   . Hypertension Maternal Grandfather   . Stroke Paternal Grandfather   . Colon cancer Neg Hx   . Esophageal cancer Neg Hx    . Pancreatic cancer Neg Hx   . Stomach cancer Neg Hx   . Liver disease Neg Hx     Allergies  Allergen Reactions  . Amlodipine Swelling  . Lanolin   . Tape Other (See Comments)    Redness (Bandaids also)    Medication list has been reviewed and updated.  Current Outpatient Medications on File Prior to Visit  Medication Sig Dispense Refill  . aMILoride (MIDAMOR) 5 MG tablet Take 1 tablet (5 mg total) by mouth daily. 90 tablet 3  . B  Complex-C (B-COMPLEX WITH VITAMIN C) tablet Take 1 tablet by mouth daily.    Marland Kitchen CALCIUM CARBONATE-VITAMIN D PO Take 1 tablet by mouth 2 (two) times daily. 600mg  calcium, D3 (1000)IU    . carvedilol (COREG) 6.25 MG tablet Take 0.5 tablets (3.125 mg total) by mouth in the morning AND 1 tablet (6.25 mg total) every evening. 45 tablet 1  . Elderberry 575 MG/5ML SYRP Take by mouth daily.    Marland Kitchen gabapentin (NEURONTIN) 300 MG capsule Take 1 capsule (300 mg total) by mouth at bedtime. 90 capsule 3  . hydrochlorothiazide (MICROZIDE) 12.5 MG capsule Take 1 capsule by mouth daily.    Marland Kitchen loratadine (CLARITIN) 10 MG tablet Take 10 mg by mouth daily.    . metFORMIN (GLUCOPHAGE) 500 MG tablet Take 1 tablet (500 mg total) by mouth 2 (two) times daily. 180 tablet 0  . pantoprazole (PROTONIX) 40 MG tablet Take 1 tablet (40 mg total) by mouth daily. 90 tablet 3  . simvastatin (ZOCOR) 20 MG tablet Take 1 tablet (20 mg total) by mouth daily. 90 tablet 3  . tamoxifen (NOLVADEX) 20 MG tablet Take 1 tablet (20 mg total) by mouth daily. 90 tablet 3  . telmisartan (MICARDIS) 80 MG tablet Take 0.5 tablets (40 mg total) by mouth daily. 90 tablet 3   No current facility-administered medications on file prior to visit.    Review of Systems:  As per HPI- otherwise negative. No CP or SOB with exercise   Physical Examination: Vitals:   04/06/20 1019  BP: 100/62  Pulse: 97  Resp: 16  SpO2: 98%   Vitals:   04/06/20 1019  Weight: 178 lb (80.7 kg)  Height: 4\' 11"  (1.499 m)    Body mass index is 35.95 kg/m. Ideal Body Weight: Weight in (lb) to have BMI = 25: 123.5  GEN: no acute distress.  Overweight, but otherwise looks well  HEENT: Atraumatic, Normocephalic.  Ears and Nose: No external deformity. CV: RRR, No M/G/R. No JVD. No thrill. No extra heart sounds. PULM: CTA B, no wheezes, crackles, rhonchi. No retractions. No resp. distress. No accessory muscle use. ABD: S, NT, ND, +BS. No rebound. No HSM. EXTR: No c/c/e PSYCH: Normally interactive. Conversant.    Assessment and Plan: Physical exam  Essential hypertension  Insulin resistance - Plan: Hemoglobin W4R, Basic metabolic panel  Metabolic syndrome  OSA on CPAP  Hypercholesteremia - Plan: Lipid panel  Ductal carcinoma in situ (DCIS) of right breast     Here today for a physical exam Labs are pending as above- Will plan further follow- up pending labs.  She will soon be entering survivorship phase from her breast cancer [name] has been working on weight loss and exercise.  She notes that her blood pressure has dropped somewhat.  We will have her decrease her carvedilol to 1/2 tablet twice a day.  If blood pressure continues to run low we can talk about coming off this entirely  Encouraged her to continue her excellent This visit occurred during the SARS-CoV-2 public health emergency.  Safety protocols were in place, including screening questions prior to the visit, additional usage of staff PPE, and extensive cleaning of exam room while observing appropriate contact time as indicated for disinfecting solutions.    Signed Lamar Blinks, MD   Received her labs as below 8/31- message to pt  Results for orders placed or performed in visit on 04/06/20  Hemoglobin A1c  Result Value Ref Range   Hgb A1c MFr Bld 5.2 <  5.7 % of total Hgb   Mean Plasma Glucose 103 (calc)   eAG (mmol/L) 5.7 (calc)  Lipid panel  Result Value Ref Range   Cholesterol 143 <200 mg/dL   HDL 43 (L) > OR = 50 mg/dL    Triglycerides 210 (H) <150 mg/dL   LDL Cholesterol (Calc) 70 mg/dL (calc)   Total CHOL/HDL Ratio 3.3 <5.0 (calc)   Non-HDL Cholesterol (Calc) 100 <130 mg/dL (calc)  Basic metabolic panel  Result Value Ref Range   Glucose, Bld 101 (H) 65 - 99 mg/dL   BUN 26 (H) 7 - 25 mg/dL   Creat 0.81 0.50 - 0.99 mg/dL   BUN/Creatinine Ratio 32 (H) 6 - 22 (calc)   Sodium 141 135 - 146 mmol/L   Potassium 4.3 3.5 - 5.3 mmol/L   Chloride 106 98 - 110 mmol/L   CO2 27 20 - 32 mmol/L   Calcium 10.0 8.6 - 10.4 mg/dL   A1c improved

## 2020-04-06 ENCOUNTER — Encounter: Payer: Self-pay | Admitting: Family Medicine

## 2020-04-06 ENCOUNTER — Other Ambulatory Visit: Payer: Self-pay

## 2020-04-06 ENCOUNTER — Ambulatory Visit (INDEPENDENT_AMBULATORY_CARE_PROVIDER_SITE_OTHER): Payer: Medicare HMO | Admitting: Family Medicine

## 2020-04-06 VITALS — BP 100/62 | HR 60 | Resp 16 | Ht 59.0 in | Wt 178.0 lb

## 2020-04-06 DIAGNOSIS — I10 Essential (primary) hypertension: Secondary | ICD-10-CM | POA: Diagnosis not present

## 2020-04-06 DIAGNOSIS — D0511 Intraductal carcinoma in situ of right breast: Secondary | ICD-10-CM

## 2020-04-06 DIAGNOSIS — G4733 Obstructive sleep apnea (adult) (pediatric): Secondary | ICD-10-CM | POA: Diagnosis not present

## 2020-04-06 DIAGNOSIS — E782 Mixed hyperlipidemia: Secondary | ICD-10-CM

## 2020-04-06 DIAGNOSIS — E8881 Metabolic syndrome: Secondary | ICD-10-CM | POA: Diagnosis not present

## 2020-04-06 DIAGNOSIS — E7800 Pure hypercholesterolemia, unspecified: Secondary | ICD-10-CM | POA: Diagnosis not present

## 2020-04-06 DIAGNOSIS — Z0000 Encounter for general adult medical examination without abnormal findings: Secondary | ICD-10-CM | POA: Diagnosis not present

## 2020-04-06 DIAGNOSIS — Z9989 Dependence on other enabling machines and devices: Secondary | ICD-10-CM

## 2020-04-06 MED ORDER — HYDROCHLOROTHIAZIDE 12.5 MG PO CAPS: 12.5000 mg | ORAL_CAPSULE | Freq: Every day | ORAL | 3 refills | Status: DC

## 2020-04-06 MED ORDER — SIMVASTATIN 20 MG PO TABS: 20.0000 mg | ORAL_TABLET | Freq: Every day | ORAL | 3 refills | Status: DC

## 2020-04-07 ENCOUNTER — Encounter: Payer: Self-pay | Admitting: Family Medicine

## 2020-04-07 LAB — BASIC METABOLIC PANEL
BUN/Creatinine Ratio: 32 (calc) — ABNORMAL HIGH (ref 6–22)
BUN: 26 mg/dL — ABNORMAL HIGH (ref 7–25)
CO2: 27 mmol/L (ref 20–32)
Calcium: 10 mg/dL (ref 8.6–10.4)
Chloride: 106 mmol/L (ref 98–110)
Creat: 0.81 mg/dL (ref 0.50–0.99)
Glucose, Bld: 101 mg/dL — ABNORMAL HIGH (ref 65–99)
Potassium: 4.3 mmol/L (ref 3.5–5.3)
Sodium: 141 mmol/L (ref 135–146)

## 2020-04-07 LAB — HEMOGLOBIN A1C
Hgb A1c MFr Bld: 5.2 % of total Hgb (ref <5.7)
Mean Plasma Glucose: 103 (calc)
eAG (mmol/L): 5.7 (calc)

## 2020-04-07 LAB — LIPID PANEL
Cholesterol: 143 mg/dL (ref <200)
HDL: 43 mg/dL — ABNORMAL LOW (ref >=50)
LDL Cholesterol (Calc): 70 mg/dL (calc)
Non-HDL Cholesterol (Calc): 100 mg/dL (calc) (ref <130)
Total CHOL/HDL Ratio: 3.3 (calc) (ref <5.0)
Triglycerides: 210 mg/dL — ABNORMAL HIGH (ref <150)

## 2020-04-09 ENCOUNTER — Other Ambulatory Visit: Payer: Self-pay

## 2020-04-09 ENCOUNTER — Ambulatory Visit (AMBULATORY_SURGERY_CENTER): Payer: Medicare HMO | Admitting: Internal Medicine

## 2020-04-09 ENCOUNTER — Encounter: Payer: Self-pay | Admitting: Internal Medicine

## 2020-04-09 VITALS — BP 121/53 | HR 48 | Temp 98.0°F | Resp 16 | Ht 59.0 in | Wt 183.0 lb

## 2020-04-09 DIAGNOSIS — G4733 Obstructive sleep apnea (adult) (pediatric): Secondary | ICD-10-CM | POA: Diagnosis not present

## 2020-04-09 DIAGNOSIS — I10 Essential (primary) hypertension: Secondary | ICD-10-CM | POA: Diagnosis not present

## 2020-04-09 DIAGNOSIS — D122 Benign neoplasm of ascending colon: Secondary | ICD-10-CM | POA: Diagnosis not present

## 2020-04-09 DIAGNOSIS — Z1211 Encounter for screening for malignant neoplasm of colon: Secondary | ICD-10-CM | POA: Diagnosis not present

## 2020-04-09 DIAGNOSIS — D120 Benign neoplasm of cecum: Secondary | ICD-10-CM

## 2020-04-09 DIAGNOSIS — K52832 Lymphocytic colitis: Secondary | ICD-10-CM

## 2020-04-09 DIAGNOSIS — E119 Type 2 diabetes mellitus without complications: Secondary | ICD-10-CM | POA: Diagnosis not present

## 2020-04-09 DIAGNOSIS — Z8719 Personal history of other diseases of the digestive system: Secondary | ICD-10-CM | POA: Diagnosis not present

## 2020-04-09 DIAGNOSIS — K635 Polyp of colon: Secondary | ICD-10-CM | POA: Diagnosis not present

## 2020-04-09 MED ORDER — SODIUM CHLORIDE 0.9 % IV SOLN: 500.0000 mL | Freq: Once | INTRAVENOUS | Status: DC

## 2020-04-09 NOTE — Progress Notes (Signed)
Pt's states no medical or surgical changes since previsit or office visit. 

## 2020-04-09 NOTE — Op Note (Signed)
[facility] Patient Name: Laura Bush Procedure Date: 04/09/2020 9:35 AM MRN: [id number] Endoscopist: Docia Chuck. Henrene Pastor , MD Age: 69 Referring MD:  Date of Birth: 1951-02-05 Gender: Female Account #: [id number] Procedure:                Colonoscopy with cold snare polypectomy x 2; with                            biopsies Indications:              Screening for colorectal malignant neoplasm.                            Previous examination 2008 at the [facility]. Biopsies revealed lymphocytic                            colitis. Patient is currently on no therapy and                            asymptomatic. Medicines:                Monitored Anesthesia Care Procedure:                Pre-Anesthesia Assessment:                           - Prior to the procedure, a History and Physical                            was performed, and patient medications and                            allergies were reviewed. The patient's tolerance of                            previous anesthesia was also reviewed. The risks                            and benefits of the procedure and the sedation                            options and risks were discussed with the patient.                            All questions were answered, and informed consent                            was obtained. Prior Anticoagulants: The patient has                            taken no previous anticoagulant or antiplatelet  agents. ASA Grade Assessment: II - A patient with                            mild systemic disease. After reviewing the risks                            and benefits, the patient was deemed in                            satisfactory condition to undergo the procedure.                           After obtaining informed consent, the colonoscope                            was passed under direct vision. Throughout the                             procedure, the patient's blood pressure, pulse, and                            oxygen saturations were monitored continuously. The                            Colonoscope was introduced through the anus and                            advanced to the the cecum, identified by                            appendiceal orifice and ileocecal valve. The                            ileocecal valve, appendiceal orifice, and rectum                            were photographed. The quality of the bowel                            preparation was excellent. The colonoscopy was                            performed without difficulty. The patient tolerated                            the procedure well. The bowel preparation used was                            SUPREP/tablets via split dose instruction. Scope In: 1:51:76 AM Scope Out: 10:03:57 AM Scope Withdrawal Time: 0 hours 12 minutes 6 seconds  Total Procedure Duration: 0 hours 17 minutes 8 seconds  Findings:                 Two polyps were found in the ascending colon and  cecum. The polyps were 2 to 6 mm in size. These                            polyps were removed with a cold snare. Resection                            and retrieval were complete.                           Multiple diverticula were found in the sigmoid                            colon.                           Internal hemorrhoids were found during                            retroflexion. The hemorrhoids were moderate.                           The entire examined colon appeared normal on direct                            and retroflexion views. Biopsies for histology were                            taken with a cold forceps from the descending colon                            and sigmoid colon for evaluation of microscopic                            colitis. Complications:            No immediate complications. Estimated blood loss:                             None. Estimated Blood Loss:     Estimated blood loss: none. Impression:               - Two 2 to 6 mm polyps in the ascending colon and                            in the cecum, removed with a cold snare. Resected                            and retrieved.                           - Diverticulosis in the sigmoid colon.                           - Internal hemorrhoids.                           -  The entire examined colon is normal on direct and                            retroflexion views. Recommendation:           - Repeat colonoscopy in 5 years for surveillance.                           - Patient has a contact number available for                            emergencies. The signs and symptoms of potential                            delayed complications were discussed with the                            patient. Return to normal activities tomorrow.                            Written discharge instructions were provided to the                            patient.                           - Resume previous diet.                           - Continue present medications.                           - Await pathology results. Docia Chuck. Henrene Pastor, MD 04/09/2020 10:11:17 AM This report has been signed electronically.

## 2020-04-09 NOTE — Progress Notes (Signed)
Report to PACU, RN, vss, BBS= Clear.  

## 2020-04-09 NOTE — Progress Notes (Signed)
Called to room to assist during endoscopic procedure.  Patient ID and intended procedure confirmed with present staff. Received instructions for my participation in the procedure from the performing physician.  

## 2020-04-09 NOTE — Patient Instructions (Signed)
Handout on polyps, diverticulosis and hemorrhoids given. ? ?YOU HAD AN ENDOSCOPIC PROCEDURE TODAY AT THE [facility]:   Refer to the procedure report that was given to you for any specific questions about what was found during the examination.  If the procedure report does not answer your questions, please call your gastroenterologist to clarify.  If you requested that your care partner not be given the details of your procedure findings, then the procedure report has been included in a sealed envelope for you to review at your convenience later. ? ?YOU SHOULD EXPECT: Some feelings of bloating in the abdomen. Passage of more gas than usual.  Walking can help get rid of the air that was put into your GI tract during the procedure and reduce the bloating. If you had a lower endoscopy (such as a colonoscopy or flexible sigmoidoscopy) you may notice spotting of blood in your stool or on the toilet paper. If you underwent a bowel prep for your procedure, you may not have a normal bowel movement for a few days. ? ?Please Note:  You might notice some irritation and congestion in your nose or some drainage.  This is from the oxygen used during your procedure.  There is no need for concern and it should clear up in a day or so. ? ?SYMPTOMS TO REPORT IMMEDIATELY: ? ?Following lower endoscopy (colonoscopy or flexible sigmoidoscopy): ? Excessive amounts of blood in the stool ? Significant tenderness or worsening of abdominal pains ? Swelling of the abdomen that is new, acute ? Fever of 100?F or higher ? ? ?For urgent or emergent issues, a gastroenterologist can be reached at any hour by calling [phone]. ?Do not use MyChart messaging for urgent concerns.  ? ? ?DIET:  We do recommend a small meal at first, but then you may proceed to your regular diet.  Drink plenty of fluids but you should avoid alcoholic beverages for 24 hours. ? ?ACTIVITY:  You should plan to take it easy for the rest of today and you  should NOT DRIVE or use heavy machinery until tomorrow (because of the sedation medicines used during the test).   ? ?FOLLOW UP: ?Our staff will call the number listed on your records 48-72 hours following your procedure to check on you and address any questions or concerns that you may have regarding the information given to you following your procedure. If we do not reach you, we will leave a message.  We will attempt to reach you two times.  During this call, we will ask if you have developed any symptoms of COVID 19. If you develop any symptoms (ie: fever, flu-like symptoms, shortness of breath, cough etc.) before then, please call [phone].  If you test positive for Covid 19 in the 2 weeks post procedure, please call and report this information to us.   ? ?If any biopsies were taken you will be contacted by phone or by letter within the next 1-3 weeks.  Please call us at [phone] if you have not heard about the biopsies in 3 weeks.  ? ? ?SIGNATURES/CONFIDENTIALITY: ?You and/or your care partner have signed paperwork which will be entered into your electronic medical record.  These signatures attest to the fact that that the information above on your After Visit Summary has been reviewed and is understood.  Full responsibility of the confidentiality of this discharge information lies with you and/or your care-partner.  ?

## 2020-04-14 ENCOUNTER — Encounter: Payer: Self-pay | Admitting: Internal Medicine

## 2020-04-14 ENCOUNTER — Telehealth: Payer: Self-pay | Admitting: *Deleted

## 2020-04-14 NOTE — Telephone Encounter (Signed)
  Follow up Call-  Call back number 04/09/2020 05/13/2019  Post procedure Call Back phone  # [phone]  Permission to leave phone message Yes Yes  Some recent data might be hidden     Patient questions:  Do you have a fever, pain , or abdominal swelling? No. Pain Score  0 *  Have you tolerated food without any problems? Yes.    Have you been able to return to your normal activities? Yes.    Do you have any questions about your discharge instructions: Diet   No. Medications  No. Follow up visit  No.  Do you have questions or concerns about your Care? No.  Actions: * If pain score is 4 or above: No action needed, pain <4  1. Have you developed a fever since your procedure? NO  2.   Have you had an respiratory symptoms (SOB or cough) since your procedure? NO  3.   Have you tested positive for COVID 19 since your procedure NO  4.   Have you had any family members/close contacts diagnosed with the COVID 19 since your procedure?  NO   If yes to any of these questions please route to Joylene John, RN and Joella Prince, RN

## 2020-04-19 ENCOUNTER — Other Ambulatory Visit: Payer: Self-pay | Admitting: Cardiovascular Disease

## 2020-04-21 ENCOUNTER — Encounter (INDEPENDENT_AMBULATORY_CARE_PROVIDER_SITE_OTHER): Payer: Self-pay | Admitting: Family Medicine

## 2020-04-21 ENCOUNTER — Other Ambulatory Visit: Payer: Self-pay

## 2020-04-21 ENCOUNTER — Ambulatory Visit (INDEPENDENT_AMBULATORY_CARE_PROVIDER_SITE_OTHER): Payer: Medicare HMO | Admitting: Family Medicine

## 2020-04-21 VITALS — BP 128/80 | HR 57 | Temp 98.5°F | Ht 59.0 in | Wt 173.0 lb

## 2020-04-21 DIAGNOSIS — I10 Essential (primary) hypertension: Secondary | ICD-10-CM

## 2020-04-21 DIAGNOSIS — E8881 Metabolic syndrome: Secondary | ICD-10-CM

## 2020-04-21 DIAGNOSIS — K635 Polyp of colon: Secondary | ICD-10-CM | POA: Diagnosis not present

## 2020-04-21 DIAGNOSIS — E782 Mixed hyperlipidemia: Secondary | ICD-10-CM | POA: Diagnosis not present

## 2020-04-21 DIAGNOSIS — Z6835 Body mass index (BMI) 35.0-35.9, adult: Secondary | ICD-10-CM | POA: Diagnosis not present

## 2020-04-21 DIAGNOSIS — Z853 Personal history of malignant neoplasm of breast: Secondary | ICD-10-CM | POA: Diagnosis not present

## 2020-04-22 NOTE — Progress Notes (Signed)
Chief Complaint:   OBESITY Laura Bush is here to discuss her progress with her obesity treatment plan along with follow-up of her obesity related diagnoses. Laura Bush is on the [employer] and states she is following her eating plan approximately 95% of the time. Laura Bush states she is walking for 60 minutes 5 times per week.  Today's visit was #: 8 Starting weight: 196 lbs Starting date: 10/17/2019 Today's weight: 173 lbs Today's date: 04/21/2020 Total lbs lost to date: 23 lbs Total lbs lost since last in-office visit: 4 lbs Total weight loss percentage to date: -11.73%  Interim History: Laura Bush's RMR has gone from 1484 to 1913.  She says that on Friday she will have a breast MRI, and she hopes it will be the last one.  In November, she will hopefully be off tamoxifen.  Her hypertension medications have significantly decreased.  Assessment/Plan:   1. Metabolic syndrome Patient is doing very well. Current total weight loss nearly 12%. Goal: Lose 7-10% of starting weight. The current medical regimen is effective;  continue present plan and medications, including Metformin.  The 10-year ASCVD risk score Laura Bush., et al., 2013) is: 10.7%   Values used to calculate the score:     Age: 69 years     Sex: Female     Is Non-Hispanic African American: No     Diabetic: No     Tobacco smoker: No     Systolic Blood Pressure: 027 mmHg     Is BP treated: Yes     HDL Cholesterol: 43 mg/dL     Total Cholesterol: 143 mg/dL  2. Essential hypertension At goal. Plan: Diet: Avoid buying foods that are: processed, frozen, or prepackaged to avoid excess salt. The patient understands monitoring parameters and red flags.    BP Readings from Last 3 Encounters:  04/21/20 128/80  04/09/20 (!) 121/53  04/06/20 100/62   3. Mixed hyperlipidemia  Lab Results  Component Value Date   CHOL 143 04/06/2020   HDL 43 (L) 04/06/2020   LDLCALC 70 04/06/2020   TRIG 210 (H) 04/06/2020   CHOLHDL 3.3  04/06/2020   Lab Results  Component Value Date   ALT 36 10/03/2019   AST 25 10/03/2019   ALKPHOS 39 10/03/2019   BILITOT 0.4 10/03/2019   Course: LDL at goal. Plan: Dietary changes: Increase soluble fiber. Decrease simple carbohydrates. Exercise changes: An average 40 minutes of moderate to vigorous-intensity aerobic activity 3 or 4 times per week. Lipid-lowering medications: Zocor.   4. Polyp of ascending colon, colonoscopy 04/09/20, with repeat due in 5 years, Dr. Henrene Pastor Updated Problem List.  5. History of breast cancer, Tamoxifen, nearing five years, Dr. Bubba Camp is really excited about the possibility of finishing Tamoxifen soon. Will continue to monitor symptoms as they relate to her weight loss journey.  6. Class 2 severe obesity with serious comorbidity and body mass index (BMI) of 35.0 to 35.9 in adult, unspecified obesity type Laura Bush) Laura Bush is currently in the action stage of change. As such, her goal is to continue with weight loss efforts. She has agreed to the Laura Bush.   Exercise goals: For substantial health benefits, adults should do at least 150 minutes (2 hours and 30 minutes) a week of moderate-intensity, or 75 minutes (1 hour and 15 minutes) a week of vigorous-intensity aerobic physical activity, or an equivalent combination of moderate- and vigorous-intensity aerobic activity. Aerobic activity should be performed in episodes of at least 10 minutes, and  preferably, it should be spread throughout the week.  Behavioral modification strategies: increasing lean protein intake.  Laura Bush has agreed to follow-up with our clinic in 4 weeks. She was informed of the importance of frequent follow-up visits to maximize her success with intensive lifestyle modifications for her multiple health conditions.   Objective:   Blood pressure 128/80, pulse (!) 57, temperature 98.5 F (36.9 C), temperature source Oral, height 4\' 11"  (1.499 m), weight 173 lb (78.5 kg),  SpO2 98 %. Body mass index is 34.94 kg/m.  General: Cooperative, alert, well developed, in no acute distress. HEENT: Conjunctivae and lids unremarkable. Cardiovascular: Regular rhythm.  Lungs: Normal work of breathing. Neurologic: No focal deficits.   Lab Results  Component Value Date   CREATININE 0.81 04/06/2020   BUN 26 (H) 04/06/2020   NA 141 04/06/2020   K 4.3 04/06/2020   CL 106 04/06/2020   CO2 27 04/06/2020   Lab Results  Component Value Date   ALT 36 10/03/2019   AST 25 10/03/2019   ALKPHOS 39 10/03/2019   BILITOT 0.4 10/03/2019   Lab Results  Component Value Date   HGBA1C 5.2 04/06/2020   HGBA1C 5.6 04/03/2019   HGBA1C 5.6 03/28/2018   Lab Results  Component Value Date   INSULIN 12.6 10/17/2019   Lab Results  Component Value Date   TSH 1.370 10/17/2019   Lab Results  Component Value Date   CHOL 143 04/06/2020   HDL 43 (L) 04/06/2020   LDLCALC 70 04/06/2020   TRIG 210 (H) 04/06/2020   CHOLHDL 3.3 04/06/2020   Lab Results  Component Value Date   WBC 5.7 12/12/2019   HGB 13.0 12/12/2019   HCT 38.9 12/12/2019   MCV 98 (H) 12/12/2019   PLT 170 12/12/2019   Lab Results  Component Value Date   IRON 120 10/17/2019   TIBC 295 10/17/2019   FERRITIN 206 (H) 12/12/2019   Obesity Behavioral Intervention:   Approximately 15 minutes were spent on the discussion below.  ASK: We discussed the diagnosis of obesity with Laura Bush today and Laura Bush agreed to give [place] permission to discuss obesity behavioral modification therapy today.  ASSESS: Laura Bush has the diagnosis of obesity and her BMI today is 35.0. Laura Bush is in the action stage of change.   ADVISE: Laura Bush was educated on the multiple health risks of obesity as well as the benefit of weight loss to improve her health. She was advised of the need for long term treatment and the importance of lifestyle modifications to improve her current health and to decrease her risk of future health  problems.  AGREE: Multiple dietary modification options and treatment options were discussed and Laura Bush agreed to follow the recommendations documented in the above note.  ARRANGE: Laura Bush was educated on the importance of frequent visits to treat obesity as outlined per CMS and USPSTF guidelines and agreed to schedule her next follow up appointment today.  Attestation Statements:   Reviewed by clinician on day of visit: allergies, medications, problem list, medical history, surgical history, family history, social history, and previous encounter notes.  I, Water quality scientist, CMA, am acting as transcriptionist for Briscoe Deutscher, DO  I have reviewed the above documentation for accuracy and completeness, and I agree with the above. Briscoe Deutscher, DO

## 2020-04-24 ENCOUNTER — Ambulatory Visit
Admission: RE | Admit: 2020-04-24 | Discharge: 2020-04-24 | Disposition: A | Discharge status: Home / Self Care | Payer: Medicare HMO | Source: Ambulatory Visit | Attending: Oncology | Admitting: Oncology

## 2020-04-24 ENCOUNTER — Other Ambulatory Visit: Payer: Self-pay

## 2020-04-24 DIAGNOSIS — Z170 Estrogen receptor positive status [ER+]: Secondary | ICD-10-CM

## 2020-04-24 DIAGNOSIS — D0511 Intraductal carcinoma in situ of right breast: Secondary | ICD-10-CM

## 2020-04-24 DIAGNOSIS — C50812 Malignant neoplasm of overlapping sites of left female breast: Secondary | ICD-10-CM

## 2020-04-24 DIAGNOSIS — N6489 Other specified disorders of breast: Secondary | ICD-10-CM | POA: Diagnosis not present

## 2020-04-24 MED ORDER — GADOBUTROL 1 MMOL/ML IV SOLN
10.0000 mL | Freq: Once | INTRAVENOUS | Status: AC | PRN
  Administered 2020-04-24: 10 mL via INTRAVENOUS

## 2020-04-27 NOTE — Progress Notes (Signed)
[facility] [place], [place],  [address] [phone]  Date:  04/29/2020   Name:  Laura Bush   DOB:  February 14, 1951   MRN:  [id number]  PCP:  Darreld Mclean, MD    Chief Complaint: Knot on Thumb (3 weeks, left thumb, no pain but irritation)   History of Present Illness:  Laura Bush is a 69 y.o. very pleasant female patient who presents with the following:  Patient today is concerned about a problem on her thumb PMH includes hypertension, venous insufficiency, GERD, hypercholesterolemia, and morbid obesity,  breast cancer diagnosed 2016  She is also being followed by the healthy weight and wellness clinic, following a pescaterian diet currently- also a little chicken   Last seen by myself for physical exam in August-that time we decreased her dose of blood pressure medication due to weight loss and exercise  Flu vaccine- give today  Cologuard is due-however pt had colonoscopy 04/09/20 per DR Henrene Pastor   She notes a small mass on the IP joint of hte left thumb for about one month It will wax and wane in size, it can cause her thumb nail to be sensitive and it hurts if hit, but not otherwise painful No known injury   Wt Readings from Last 3 Encounters:  04/29/20 176 lb (79.8 kg)  04/21/20 173 lb (78.5 kg)  04/09/20 183 lb (83 kg)     Patient Active Problem List   Diagnosis Date Noted  . Metabolic syndrome 51/09/5850  . Mixed hyperlipidemia 04/21/2020  . Polyp of ascending colon 04/21/2020  . History of breast cancer, Tamoxifen, nearing five years, Dr. Griffith Citron 04/21/2020  . Morbid obesity ([facility]) 09/30/2019  . Osteopenia 10/02/2018  . Malignant 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, estrogen receptor positive ([facility]) 05/29/2017  . 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 Vitamin D deficiency 01/29/2016  . Hot flashes due to tamoxifen 10/05/2015  .  Eczema 12/29/2014  . Venous insufficiency 10/16/2014  . Essential hypertension 10/01/2014  . Hypercholesteremia 10/01/2014  . GERD (gastroesophageal reflux disease) 10/01/2014  . Other bilateral bundle branch block 06/23/2003    Past Medical History:  Diagnosis Date  . Asthma    in cold weather  . Breast cancer ([facility] [facility]) 09/18/14   left breast  . Breast cancer ([facility]) 10/07/14   bx left breast  . Breast cancer of upper-outer quadrant of left female breast ([facility]) 09/22/2014  . Cluster headaches    in her 40's  . GERD (gastroesophageal reflux disease)   . Heart murmur    as a child (has outgrown)  . High cholesterol   . Hypertension   . Lower extremity edema   . Microscopic colitis   . Morbid obesity ([facility]) 09/30/2019  . OSA on CPAP   . Personal history of radiation therapy   . Pneumonia 2000's X 1  . PONV (postoperative nausea and vomiting)   . Rosacea   . S/P radiation therapy 12/30/14-01/27/15   left breast 50Gy total dose  . 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 Swallowing difficulty   . Venous insufficiency     Past Surgical History:  Procedure Laterality Date  . ABDOMINAL HYSTERECTOMY  1999  . APPENDECTOMY  ~ 2006  . BREAST BIOPSY Left 10/2014  . BREAST LUMPECTOMY Left 2016  . BREAST REDUCTION SURGERY Bilateral 11/11/2014   Procedure: Bilateral Breast Reduction;  Surgeon:  Crissie Reese, MD;  Location: [facility];  Service: Plastics;  Laterality: Bilateral;  . CARPAL TUNNEL RELEASE Bilateral    2 surgeries on right, 1 on left  . CESAREAN SECTION  1978; 1980  . COLONOSCOPY    . FOREARM FRACTURE SURGERY Right 2003 X 3  . KNEE ARTHROSCOPY Bilateral    meniscus repair  . PARTIAL MASTECTOMY WITH NEEDLE LOCALIZATION AND AXILLARY SENTINEL LYMPH NODE BX Left 11/11/2014   Procedure: LEFT BREAST PARTIAL MASTECTOMY WITH NEEDLE LOCALIZATION TIMES TWO AND LEFT AXILLARY SENTINEL LYMPH NODE Biopsy;  Surgeon: Fanny Skates, MD;  Location: [facility];  Service: General;  Laterality: Left;  . SQUAMOUS  CELL CARCINOMA EXCISION  1980's X 1   "nose"  . TONSILLECTOMY  ~ 1957  . TUBAL LIGATION  ?1984    Social History   Tobacco Use  . Smoking status: Never Smoker  . Smokeless tobacco: Never Used  Substance Use Topics  . Alcohol use: Yes    Alcohol/week: 5.0 standard drinks    Types: 5 Glasses of wine per week  . Drug use: No    Family History  Problem Relation Age of Onset  . Uterine cancer Sister 68  . Stroke Father        deceased  . Heart failure Father   . Hypertension Father   . Dementia Mother        Lives in [place]  . Hypertension Mother   . Hyperlipidemia Mother   . Heart attack Maternal Grandmother   . Hypertension Maternal Grandfather   . Stroke Paternal Grandfather   . Colon cancer Neg Hx   . Esophageal cancer Neg Hx   . Pancreatic cancer Neg Hx   . Stomach cancer Neg Hx   . Liver disease Neg Hx     Allergies  Allergen Reactions  . Amlodipine Swelling  . Lanolin   . Tape Other (See Comments)    Redness (Bandaids also)    Medication list has been reviewed and updated.  Current Outpatient Medications on File Prior to Visit  Medication Sig Dispense Refill  . aMILoride (MIDAMOR) 5 MG tablet Take 1 tablet (5 mg total) by mouth daily. 90 tablet 3  . B Complex-C (B-COMPLEX WITH VITAMIN C) tablet Take 1 tablet by mouth daily.    Marland Kitchen CALCIUM CARBONATE-VITAMIN D PO Take 1 tablet by mouth 2 (two) times daily. 600mg  calcium, D3 (1000)IU    . carvedilol (COREG) 3.125 MG tablet Take 3.125 mg by mouth 2 (two) times daily with a meal.    . Elderberry 575 MG/5ML SYRP Take by mouth daily.    Marland Kitchen gabapentin (NEURONTIN) 300 MG capsule Take 1 capsule (300 mg total) by mouth at bedtime. 90 capsule 3  . hydrochlorothiazide (MICROZIDE) 12.5 MG capsule Take 1 capsule (12.5 mg total) by mouth daily. 90 capsule 3  . loratadine (CLARITIN) 10 MG tablet Take 10 mg by mouth daily.    . metFORMIN (GLUCOPHAGE) 500 MG tablet Take 1 tablet (500 mg total) by mouth 2 (two) times daily.  180 tablet 0  . pantoprazole (PROTONIX) 40 MG tablet Take 1 tablet (40 mg total) by mouth daily. 90 tablet 3  . simvastatin (ZOCOR) 20 MG tablet Take 1 tablet (20 mg total) by mouth daily. 90 tablet 3  . tamoxifen (NOLVADEX) 20 MG tablet Take 1 tablet (20 mg total) by mouth daily. 90 tablet 3  . telmisartan (MICARDIS) 80 MG tablet Take 0.5 tablets (40 mg total) by mouth daily. 90 tablet 3  No current facility-administered medications on file prior to visit.    Review of Systems:  As per HPI- otherwise negative.   Physical Examination: Vitals:   04/29/20 1314  BP: 124/76  Pulse: (!) 54  Resp: 15  SpO2: 96%   Vitals:   04/29/20 1314  Weight: 176 lb (79.8 kg)  Height: 4\' 11"  (1.499 m)   Body mass index is 35.55 kg/m. Ideal Body Weight: Weight in (lb) to have BMI = 25: 123.5  GEN: no acute distress.  Overweight, looks well HEENT: Atraumatic, Normocephalic.  Ears and Nose: No external deformity. CV: RRR, No M/G/R. No JVD. No thrill. No extra heart sounds. PULM: CTA B, no wheezes, crackles, rhonchi. No retractions. No resp. distress. No accessory muscle use. EXTR: No c/c/e PSYCH: Normally interactive. Conversant. Left thumb IP joint displays a small mucoid cyst- tender if pressed on No sign of infection Normal ROM of thumb joints OA of hands    Assessment and Plan: Mucoid cyst of joint - Plan: Ambulatory referral to Hand Surgery  Needs flu shot - Plan: Flu Vaccine QUAD High Dose(Fluad)  Here today with mucoid cyst of finger.  It is bothersome to pt- referral to hand surgery for treatment Flu shot given today  This visit occurred during the SARS-CoV-2 public health emergency.  Safety protocols were in place, including screening questions prior to the visit, additional usage of staff PPE, and extensive cleaning of exam room while observing appropriate contact time as indicated for disinfecting solutions.    Signed Lamar Blinks, MD

## 2020-04-29 ENCOUNTER — Other Ambulatory Visit: Payer: Self-pay

## 2020-04-29 ENCOUNTER — Encounter: Payer: Self-pay | Admitting: Family Medicine

## 2020-04-29 ENCOUNTER — Ambulatory Visit (INDEPENDENT_AMBULATORY_CARE_PROVIDER_SITE_OTHER): Payer: Medicare HMO | Admitting: Family Medicine

## 2020-04-29 VITALS — BP 124/76 | HR 54 | Resp 15 | Ht 59.0 in | Wt 176.0 lb

## 2020-04-29 DIAGNOSIS — Z23 Encounter for immunization: Secondary | ICD-10-CM | POA: Diagnosis not present

## 2020-04-29 DIAGNOSIS — M6740 Ganglion, unspecified site: Secondary | ICD-10-CM

## 2020-04-29 NOTE — Patient Instructions (Signed)
Good to see you again today!  Flu shot given I will set you up for hand surgery eval in [place] for your mucoid cyst

## 2020-04-30 ENCOUNTER — Ambulatory Visit: Payer: Medicare HMO | Admitting: Pharmacist

## 2020-04-30 DIAGNOSIS — E8881 Metabolic syndrome: Secondary | ICD-10-CM

## 2020-04-30 DIAGNOSIS — I10 Essential (primary) hypertension: Secondary | ICD-10-CM

## 2020-04-30 DIAGNOSIS — E782 Mixed hyperlipidemia: Secondary | ICD-10-CM

## 2020-04-30 NOTE — Chronic Care Management (AMB) (Signed)
Chronic Care Management Pharmacy  Name: Laura Bush  MRN: [id number] DOB: April 28, 1951   Chief Complaint/ HPI  Laura Bush,  69 y.o. , female presents for their Follow-Up CCM visit with the clinical pharmacist via telephone due to COVID-19 Pandemic.  PCP : Darreld Mclean, MD  Their chronic conditions include: HTN, HLD, Metabolic Syndrome, GERD, Allergic Rhinitis, Osteopenia, Hx of Breast Cancer  Office Visits: 04/29/20: Visit w/ Dr. Lorelei Pont - Referral to hand surgery due to mucoid cyst of joint. Flu shot given.  Consult Visit: 04/21/20: Weight Management visit w/ Dr. Juleen China - Metabolic Syndrome. Starting weight: 196lbs. Today's weight: 173lbs. Total Weight Lost Since Last visit: 4lbs  Medications: Outpatient Encounter Medications as of 04/30/2020  Medication Sig  . CALCIUM CARBONATE-VITAMIN D PO Take 1 tablet by mouth 2 (two) times daily. 600mg  calcium, D3 (1000)IU  . carvedilol (COREG) 3.125 MG tablet Take 3.125 mg by mouth 2 (two) times daily with a meal.  . hydrochlorothiazide (MICROZIDE) 12.5 MG capsule Take 1 capsule (12.5 mg total) by mouth daily.  . metFORMIN (GLUCOPHAGE) 500 MG tablet Take 1 tablet (500 mg total) by mouth 2 (two) times daily.  . pantoprazole (PROTONIX) 40 MG tablet Take 1 tablet (40 mg total) by mouth daily.  . simvastatin (ZOCOR) 20 MG tablet Take 1 tablet (20 mg total) by mouth daily.  . tamoxifen (NOLVADEX) 20 MG tablet Take 1 tablet (20 mg total) by mouth daily.  Marland Kitchen telmisartan (MICARDIS) 80 MG tablet Take 0.5 tablets (40 mg total) by mouth daily.  Marland Kitchen aMILoride (MIDAMOR) 5 MG tablet Take 1 tablet (5 mg total) by mouth daily.  . B Complex-C (B-COMPLEX WITH VITAMIN C) tablet Take 1 tablet by mouth daily.  Kendall Flack 575 MG/5ML SYRP Take by mouth daily.  Marland Kitchen gabapentin (NEURONTIN) 300 MG capsule Take 1 capsule (300 mg total) by mouth at bedtime.  Marland Kitchen loratadine (CLARITIN) 10 MG tablet Take 10 mg by mouth daily.   No facility-administered  encounter medications on file as of 04/30/2020.   SDOH Screenings   Alcohol Screen:   . Last Alcohol Screening Score (AUDIT): Not on file  Depression (PHQ2-9): Medium Risk  . PHQ-2 Score: 5  Financial Resource Strain: Low Risk   . Difficulty of Paying Living Expenses: Not very hard  Food Insecurity:   . Worried About Charity fundraiser in the Last Year: Not on file  . Ran Out of Food in the Last Year: Not on file  Housing:   . Last Housing Risk Score: Not on file  Physical Activity: Sufficiently Active  . Days of Exercise per Week: 7 days  . Minutes of Exercise per Session: 60 min  Social Connections:   . Frequency of Communication with Friends and Family: Not on file  . Frequency of Social Gatherings with Friends and Family: Not on file  . Attends Religious Services: Not on file  . Active Member of Clubs or Organizations: Not on file  . Attends Archivist Meetings: Not on file  . Marital Status: Not on file  Stress:   . Feeling of Stress : Not on file  Tobacco Use: Low Risk   . Smoking Tobacco Use: Never Smoker  . Smokeless Tobacco Use: Never Used  Transportation Needs:   . Film/video editor (Medical): Not on file  . Lack of Transportation (Non-Medical): Not on file      Current Diagnosis/Assessment:  Goals Addressed  This Visit's Progress   . Chronic Care Management Pharmacy Care Plan       CARE PLAN ENTRY (see longitudinal plan of care for additional care plan information)  Current Barriers:  . Chronic Disease Management support, education, and care coordination needs related to HTN, HLD, Metabolic Syndrome, GERD, Allergic Rhinitis, Osteopenia, Hx of Breast Cancer   Hypertension BP Readings from Last 3 Encounters:  04/29/20 124/76  03/09/20 (!) 175/98  04/21/20 128/80   . Pharmacist Clinical Goal(s): o Over the next 180 days, patient will work with PharmD and providers to maintain BP goal <140/90 . Current regimen:    Telmisartan 80mg  1/2 tab daily  Hydrochlorothiazide 25mg  1/2 tab daily  Carvedilol 6.25mg  1/2 tab twice daily  Amiloride 5mg  daily . Patient self care activities - Over the next 180 days, patient will: o Check BP every other Laura Bush, document, and provide at future appointments o Ensure daily salt intake < 2300 mg/Laura Bush  Hyperlipidemia Lab Results  Component Value Date/Time   LDLCALC 70 04/06/2020 10:59 AM   . Pharmacist Clinical Goal(s): o Over the next 180 days, patient will work with PharmD and providers to maintain LDL goal < 100 . Current regimen:  o Simvastatin 20mg  daily . Patient self care activities - Over the next 180 days, patient will: o Maintain cholesterol medication regimen.   Metabolic Syndrome Lab Results  Component Value Date/Time   HGBA1C 5.2 04/06/2020 10:59 AM   HGBA1C 5.6 04/03/2019 10:37 AM   . Pharmacist Clinical Goal(s): o Over the next 180 days, patient will work with PharmD and providers to maintain A1c goal <6.5% . Current regimen:  o Metformin 500mg  twice daily . Interventions: o Discussed dosing of metformin.  . Patient self care activities - Over the next 180 days, patient will: o Maintain a1c <6.5%  Medication management . Pharmacist Clinical Goal(s): o Over the next 180 days, patient will work with PharmD and providers to maintain optimal medication adherence . [facility] . Interventions o Comprehensive medication review performed. o Utilize [facility] for medication synchronization, packaging and delivery . Patient self care activities - Over the next 180 days, patient will: o Focus on medication adherence by filling and taking medications appropriately  o Take medications as prescribed o Report any questions or concerns to PharmD and/or provider(s)  Please see past updates related to this goal by clicking on the "Past Updates" button in the selected goal      Social Hx:  Husband owned a Engineer, structural and then went to [employer].  Uses pill box.  Does mail order and local pharmacy.   Hypertension   BP goal is:  <140/90  Office blood pressures are  BP Readings from Last 3 Encounters:  04/29/20 124/76  03/09/20 (!) 175/98  04/21/20 128/80   Patient checks BP at home 3-5x per week Patient home BP readings are ranging:  125 77 66 116 77 58 140 97 60 121  74  111 68 Avg  122.6 78.6  Patient has failed these meds in the past: None noted  Patient is currently controlled on the following medications:   Telmisartan 80mg  1/2 tab daily  Hydrochlorothiazide 25mg  1/2 tab daily  Carvedilol 6.25mg  1/2 tab twice daily  Amiloride 5mg  daily  No headaches, chest pains, dizziness.  Walking with her husband every morning for about an hour (~2-3 miles) She is excited about reducing the number of BP medications.   Plan -Continue current medications  Hyperlipidemia   Lipid Panel     Component Value Date/Time   CHOL 143 04/06/2020 1059   TRIG 210 (H) 04/06/2020 1059   HDL 43 (L) 04/06/2020 1059   CHOLHDL 3.3 04/06/2020 1059   VLDL 33.8 04/03/2019 1037   LDLCALC 70 04/06/2020 1059    LDL goal <100 TG goal <150  The 10-year ASCVD risk score Laura Bush DC Jr., et al., 2013) is: 10.1%   Values used to calculate the score:     Age: 87 years     Sex: Female     Is Non-Hispanic African American: No     Diabetic: No     Tobacco smoker: No     Systolic Blood Pressure: 035 mmHg     Is BP treated: Yes     HDL Cholesterol: 43 mg/dL     Total Cholesterol: 143 mg/dL   Patient has failed these meds in past: None noted  Patient is currently controlled,except for TG on the following medications:   Simvastatin 20mg  daily  September: Went to a plant based diet  Is hopeful her TG will be down  We discussed:  How a plant based diet could greatly affect TG levels positively. Encouraged patient to continue plant based diet   Update 04/30/20 LDL still at goal. TRIG trended up.  Is  focusing on protein. Following a pescatarian diet with occassional chicken  Plan -Continue current medications    Metabolic Syndrome   K0X goal <6.5%  Recent Relevant Labs: Lab Results  Component Value Date/Time   HGBA1C 5.2 04/06/2020 10:59 AM   HGBA1C 5.6 04/03/2019 10:37 AM   GFR 63.00 08/13/2019 09:33 AM   GFR 66.50 04/03/2019 10:37 AM    Patient has failed these meds in past: None noted  Patient is currently controlled on the following medications: Marland Kitchen Metformin 500mg  twice daily  Asks about metformin and if she can take both 500mg  tabs at one time since one of her friends takes it this way.  Discussed that it could be dosed this waiting noting the max dose of metformin is 2000mg  daily for the indication of diabetes, but noting she is using it for metabolic syndrome she may not be titrated to this dose.  Discussed that higher doses of metformin could be associated with GI side effects.   Plan -Continue current medications  GERD    Patient has failed these meds in past: None noted  Patient is currently controlled on the following medications:   Omeprazole 40mg  daily   No breakthrough sx  Esophagus was stretched.  Was told she had scarring from GERD  Update 04/30/20 No issues  Plan -Continue current medications   Allergic Rhinitis    Patient has failed these meds in past: switches between different products due to efficacy wearing off Patient is currently controlled on the following medications:   Allegra 180mg  daily  Improved since switch to Claritin  Cetirizine was not lasting all Laura Bush Has to switch antihistamine products monthly due to inefficacy over time  Update 04/30/20 Changed from claritin to allegra. Didn't feel claritin was lasting the whole Laura Bush.  Plan -Continue current medications    History of Breast Cancer    Patient has failed these meds in past: None noted  Patient is currently controlled on the following medications:   Tamoxifen  20mg  daily  Gabapentin 300mg  HS  Seeing oncologist in November. Will most likely come off of tamoxifen 20mg . Completing 5 years. Gabapentin 300mg  for hot flashes and insomnia with tamoxifen.  Update 04/30/20 Still on track to D/C tamoxifen and gabapentin.  She is excited about reducing her medication list.  Plan -Continue current medications   FYI: 05/20/20 Evaluation for Hand Surgery w/ Dr. Gerarda Fraction  Miscellenous Meds B complex daily Laura Bush daily   Laura Beam Jerson Furukawa, PharmD Clinical Pharmacist [facility] [phone]

## 2020-04-30 NOTE — Patient Instructions (Signed)
Visit Information  Goals Addressed            This Visit's Progress    Chronic Care Management Pharmacy Care Plan       CARE PLAN ENTRY (see longitudinal plan of care for additional care plan information)  Current Barriers:   Chronic Disease Management support, education, and care coordination needs related to HTN, HLD, Metabolic Syndrome, GERD, Allergic Rhinitis, Osteopenia, Hx of Breast Cancer   Hypertension BP Readings from Last 3 Encounters:  04/29/20 124/76  03/09/20 (!) 175/98  04/21/20 128/80    Pharmacist Clinical Goal(s): o Over the next 180 days, patient will work with PharmD and providers to maintain BP goal <140/90  Current regimen:   Telmisartan 80mg  1/2 tab daily  Hydrochlorothiazide 25mg  1/2 tab daily  Carvedilol 6.25mg  1/2 tab twice daily  Amiloride 5mg  daily  Patient self care activities - Over the next 180 days, patient will: o Check BP every other Aaryav Hopfensperger, document, and provide at future appointments o Ensure daily salt intake < 2300 mg/Micha Dosanjh  Hyperlipidemia Lab Results  Component Value Date/Time   LDLCALC 70 04/06/2020 10:59 AM    Pharmacist Clinical Goal(s): o Over the next 180 days, patient will work with PharmD and providers to maintain LDL goal < 100  Current regimen:  o Simvastatin 20mg  daily  Patient self care activities - Over the next 180 days, patient will: o Maintain cholesterol medication regimen.   Metabolic Syndrome Lab Results  Component Value Date/Time   HGBA1C 5.2 04/06/2020 10:59 AM   HGBA1C 5.6 04/03/2019 10:37 AM    Pharmacist Clinical Goal(s): o Over the next 180 days, patient will work with PharmD and providers to maintain A1c goal <6.5%  Current regimen:  o Metformin 500mg  twice daily  Interventions: o Discussed dosing of metformin.   Patient self care activities - Over the next 180 days, patient will: o Maintain a1c <6.5%  Medication management  Pharmacist Clinical Goal(s): o Over the next 180 days,  patient will work with PharmD and providers to maintain optimal medication adherence  [facility]  Interventions o Comprehensive medication review performed. o Utilize [facility] pharmacy for medication synchronization, packaging and delivery  Patient self care activities - Over the next 180 days, patient will: o Focus on medication adherence by filling and taking medications appropriately  o Take medications as prescribed o Report any questions or concerns to PharmD and/or provider(s)  Please see past updates related to this goal by clicking on the "Past Updates" button in the selected goal        The patient verbalized understanding of instructions provided today and agreed to receive a mailed copy of patient instruction and/or educational materials.  Telephone follow up appointment with pharmacy team member scheduled for: 10/28/2020  De Blanch, PharmD Clinical Pharmacist [facility] [phone]

## 2020-05-19 ENCOUNTER — Encounter (INDEPENDENT_AMBULATORY_CARE_PROVIDER_SITE_OTHER): Payer: Self-pay | Admitting: Family Medicine

## 2020-05-19 ENCOUNTER — Other Ambulatory Visit: Payer: Self-pay

## 2020-05-19 ENCOUNTER — Ambulatory Visit (INDEPENDENT_AMBULATORY_CARE_PROVIDER_SITE_OTHER): Payer: Medicare HMO | Admitting: Family Medicine

## 2020-05-19 ENCOUNTER — Encounter: Payer: Self-pay | Admitting: Family Medicine

## 2020-05-19 VITALS — BP 130/84 | HR 54 | Temp 98.2°F | Ht 59.0 in | Wt 167.0 lb

## 2020-05-19 DIAGNOSIS — R232 Flushing: Secondary | ICD-10-CM

## 2020-05-19 DIAGNOSIS — Z6833 Body mass index (BMI) 33.0-33.9, adult: Secondary | ICD-10-CM | POA: Diagnosis not present

## 2020-05-19 DIAGNOSIS — E669 Obesity, unspecified: Secondary | ICD-10-CM | POA: Diagnosis not present

## 2020-05-19 DIAGNOSIS — Z9989 Dependence on other enabling machines and devices: Secondary | ICD-10-CM

## 2020-05-19 DIAGNOSIS — G4733 Obstructive sleep apnea (adult) (pediatric): Secondary | ICD-10-CM

## 2020-05-19 DIAGNOSIS — I10 Essential (primary) hypertension: Secondary | ICD-10-CM | POA: Diagnosis not present

## 2020-05-19 DIAGNOSIS — E782 Mixed hyperlipidemia: Secondary | ICD-10-CM

## 2020-05-19 DIAGNOSIS — E8881 Metabolic syndrome: Secondary | ICD-10-CM

## 2020-05-19 DIAGNOSIS — Z853 Personal history of malignant neoplasm of breast: Secondary | ICD-10-CM | POA: Diagnosis not present

## 2020-05-19 DIAGNOSIS — E88819 Insulin resistance, unspecified: Secondary | ICD-10-CM

## 2020-05-19 NOTE — Progress Notes (Signed)
Chief Complaint:   OBESITY Karia is here to discuss her progress with her obesity treatment plan along with follow-up of her obesity related diagnoses.   Today's visit was #: 9 Starting weight: 196 lbs Starting date: 10/17/2019 Today's weight: 167 lbs Today's date: 05/19/2020 Total lbs lost to date: 29 lbs Body mass index is 33.73 kg/m.  Total weight loss percentage to date: -14.80%  Interim History: Teshia is down 29 pounds today.  At home, her blood pressure has been running 597-416 systolic, pulse 60.  Decrease Coreg morning dose if blood pressure is low.  Nutrition Plan: Pescatarian with Chicken. Anti-obesity medications: metformin 500 twice daily. Reported side effects: None. Hunger is well controlled controlled. Cravings are well controlled controlled.  Activity: Walking 2+ miles/yoga 6-7 times per week  Assessment/Plan:   1. Mixed hyperlipidemia  Lab Results  Component Value Date   CHOL 143 04/06/2020   HDL 43 (L) 04/06/2020   LDLCALC 70 04/06/2020   TRIG 210 (H) 04/06/2020   CHOLHDL 3.3 04/06/2020   Lab Results  Component Value Date   ALT 36 10/03/2019   AST 25 10/03/2019   ALKPHOS 39 10/03/2019   BILITOT 0.4 10/03/2019   The 10-year ASCVD risk score Mikey Bussing DC Jr., et al., 2013) is: 11.1%   Values used to calculate the score:     Age: 89 years     Sex: Female     Is Non-Hispanic African American: No     Diabetic: No     Tobacco smoker: No     Systolic Blood Pressure: 384 mmHg     Is BP treated: Yes     HDL Cholesterol: 43 mg/dL     Total Cholesterol: 143 mg/dL  Course: Improving. Target levels for LDL are: Under 100. Plan: Dietary changes: Increase soluble fiber. Decrease simple carbohydrates. Exercise changes: An average 40 minutes of moderate to vigorous-intensity aerobic activity 3 or 4 times per week. Lipid-lowering medications: Zocor.  2. Essential hypertension At goal. Medications: Coreg, HCTZ, Micardis, amloride. Plan: Monitor home BP.  Diet: Avoid buying foods that are: processed, frozen, or prepackaged to avoid excess salt. The patient understands monitoring parameters and red flags.   BP Readings from Last 3 Encounters:  05/19/20 130/84  04/29/20 124/76  03/09/20 (!) 175/98   Lab Results  Component Value Date   CREATININE 0.81 04/06/2020   3. Insulin resistance Goal is HgbA1c < 5.7 and insulin level closer to 5. She will continue to focus on protein-rich, low simple carbohydrate foods. We reviewed the importance of hydration, regular exercise for stress reduction, and restorative sleep.   Lab Results  Component Value Date   INSULIN 12.6 10/17/2019   Lab Results  Component Value Date   HGBA1C 5.2 04/06/2020   4. OSA on CPAP Biannca has a diagnosis of sleep apnea. She reports that she is using a CPAP regularly.   5. History of breast cancer, Tamoxifen, nearing five years, Dr. Bubba Camp is taking tamoxifen 20 mg daily.  She has an upcoming appointment with Dr. Griffith Citron.  She is excited about the possibility of stopping tamoxifen.  6. Class 1 obesity with serious comorbidity and body mass index (BMI) of 33.0 to 33.9 in adult, unspecified obesity type  Cia is currently in the action stage of change. As such, her goal is to continue with weight loss efforts.   Nutrition goals: She has agreed to the [facility] +chicken.   Exercise goals: For substantial health benefits, adults should do  at least 150 minutes (2 hours and 30 minutes) a week of moderate-intensity, or 75 minutes (1 hour and 15 minutes) a week of vigorous-intensity aerobic physical activity, or an equivalent combination of moderate- and vigorous-intensity aerobic activity. Aerobic activity should be performed in episodes of at least 10 minutes, and preferably, it should be spread throughout the week.  Behavioral modification strategies: increasing lean protein intake.  Sharran has agreed to follow-up with our clinic in 4 weeks. She was  informed of the importance of frequent follow-up visits to maximize her success with intensive lifestyle modifications for her multiple health conditions.   Objective:   Blood pressure 130/84, pulse (!) 54, temperature 98.2 F (36.8 C), temperature source Oral, height 4\' 11"  (1.499 m), weight 167 lb (75.8 kg), SpO2 97 %. Body mass index is 33.73 kg/m.  General: Cooperative, alert, well developed, in no acute distress. HEENT: Conjunctivae and lids unremarkable. Cardiovascular: Regular rhythm.  Lungs: Normal work of breathing. Neurologic: No focal deficits.   Lab Results  Component Value Date   CREATININE 0.81 04/06/2020   BUN 26 (H) 04/06/2020   NA 141 04/06/2020   K 4.3 04/06/2020   CL 106 04/06/2020   CO2 27 04/06/2020   Lab Results  Component Value Date   ALT 36 10/03/2019   AST 25 10/03/2019   ALKPHOS 39 10/03/2019   BILITOT 0.4 10/03/2019   Lab Results  Component Value Date   HGBA1C 5.2 04/06/2020   HGBA1C 5.6 04/03/2019   HGBA1C 5.6 03/28/2018   Lab Results  Component Value Date   INSULIN 12.6 10/17/2019   Lab Results  Component Value Date   TSH 1.370 10/17/2019   Lab Results  Component Value Date   CHOL 143 04/06/2020   HDL 43 (L) 04/06/2020   LDLCALC 70 04/06/2020   TRIG 210 (H) 04/06/2020   CHOLHDL 3.3 04/06/2020   Lab Results  Component Value Date   WBC 5.7 12/12/2019   HGB 13.0 12/12/2019   HCT 38.9 12/12/2019   MCV 98 (H) 12/12/2019   PLT 170 12/12/2019   Lab Results  Component Value Date   IRON 120 10/17/2019   TIBC 295 10/17/2019   FERRITIN 206 (H) 12/12/2019   Obesity Behavioral Intervention:   Approximately 15 minutes were spent on the discussion below.  ASK: We discussed the diagnosis of obesity with [name] today and Talaysia agreed to give [place] permission to discuss obesity behavioral modification therapy today.  ASSESS: Kimisha has the diagnosis of obesity and her BMI today is 33.9. Shey is in the action stage of change.    ADVISE: Carmeline was educated on the multiple health risks of obesity as well as the benefit of weight loss to improve her health. She was advised of the need for long term treatment and the importance of lifestyle modifications to improve her current health and to decrease her risk of future health problems.  AGREE: Multiple dietary modification options and treatment options were discussed and Shamiya agreed to follow the recommendations documented in the above note.  ARRANGE: Lynde was educated on the importance of frequent visits to treat obesity as outlined per CMS and USPSTF guidelines and agreed to schedule her next follow up appointment today.  Attestation Statements:   Reviewed by clinician on day of visit: allergies, medications, problem list, medical history, surgical history, family history, social history, and previous encounter notes.  I, Water quality scientist, CMA, am acting as transcriptionist for Briscoe Deutscher, DO  I have reviewed the above documentation for  accuracy and completeness, and I agree with the above. Briscoe Deutscher, DO

## 2020-05-20 DIAGNOSIS — M67442 Ganglion, left hand: Secondary | ICD-10-CM | POA: Diagnosis not present

## 2020-05-20 DIAGNOSIS — R2232 Localized swelling, mass and lump, left upper limb: Secondary | ICD-10-CM | POA: Diagnosis not present

## 2020-05-20 MED ORDER — GABAPENTIN 300 MG PO CAPS: 300.0000 mg | ORAL_CAPSULE | Freq: Every day | ORAL | 1 refills | Status: DC

## 2020-06-08 ENCOUNTER — Encounter: Payer: Self-pay | Admitting: Family Medicine

## 2020-06-08 DIAGNOSIS — L821 Other seborrheic keratosis: Secondary | ICD-10-CM | POA: Diagnosis not present

## 2020-06-08 DIAGNOSIS — D225 Melanocytic nevi of trunk: Secondary | ICD-10-CM | POA: Diagnosis not present

## 2020-06-08 DIAGNOSIS — L814 Other melanin hyperpigmentation: Secondary | ICD-10-CM | POA: Diagnosis not present

## 2020-06-08 DIAGNOSIS — D1801 Hemangioma of skin and subcutaneous tissue: Secondary | ICD-10-CM | POA: Diagnosis not present

## 2020-06-08 DIAGNOSIS — L820 Inflamed seborrheic keratosis: Secondary | ICD-10-CM | POA: Diagnosis not present

## 2020-06-15 ENCOUNTER — Other Ambulatory Visit: Payer: Self-pay

## 2020-06-15 ENCOUNTER — Ambulatory Visit (INDEPENDENT_AMBULATORY_CARE_PROVIDER_SITE_OTHER): Payer: Medicare HMO | Admitting: Family Medicine

## 2020-06-15 ENCOUNTER — Encounter (INDEPENDENT_AMBULATORY_CARE_PROVIDER_SITE_OTHER): Payer: Self-pay | Admitting: Family Medicine

## 2020-06-15 VITALS — BP 113/71 | HR 53 | Temp 97.8°F | Ht 59.0 in | Wt 166.0 lb

## 2020-06-15 DIAGNOSIS — E8881 Metabolic syndrome: Secondary | ICD-10-CM

## 2020-06-15 DIAGNOSIS — E669 Obesity, unspecified: Secondary | ICD-10-CM

## 2020-06-15 DIAGNOSIS — I10 Essential (primary) hypertension: Secondary | ICD-10-CM

## 2020-06-15 DIAGNOSIS — C50812 Malignant neoplasm of overlapping sites of left female breast: Secondary | ICD-10-CM

## 2020-06-15 DIAGNOSIS — M1909 Primary osteoarthritis, other specified site: Secondary | ICD-10-CM

## 2020-06-15 DIAGNOSIS — Z6833 Body mass index (BMI) 33.0-33.9, adult: Secondary | ICD-10-CM

## 2020-06-15 DIAGNOSIS — E65 Localized adiposity: Secondary | ICD-10-CM | POA: Diagnosis not present

## 2020-06-15 DIAGNOSIS — Z170 Estrogen receptor positive status [ER+]: Secondary | ICD-10-CM

## 2020-06-15 MED ORDER — METFORMIN HCL 500 MG PO TABS: 500.0000 mg | ORAL_TABLET | Freq: Two times a day (BID) | ORAL | 0 refills | Status: DC

## 2020-06-15 MED ORDER — DICLOFENAC SODIUM 50 MG PO TBEC: 50.0000 mg | DELAYED_RELEASE_TABLET | Freq: Two times a day (BID) | ORAL | 0 refills | Status: DC

## 2020-06-15 NOTE — Progress Notes (Signed)
Chief Complaint:   OBESITY Laura Bush is here to discuss her progress with her obesity treatment plan along with follow-up of her obesity related diagnoses.   Today's visit was #: 10 Starting weight: 196 lbs Starting date: 10/17/2019 Today's weight: 166 lbs Today's date: 06/15/2020 Total lbs lost to date: 30 lbs Body mass index is 33.53 kg/m.  Total weight loss percentage to date: -15.31%  Interim History: Laura Bush says that with increasing cold weather, she has increasing aching in her bilateral shoulders.  She says she has been working in her garden lately.  She also has right QL pain/spasm.  She needs a refill on her metformin.    She has an appointment with Oncology tomorrow.  She is hoping to be off tamoxifen/gabapentin.  Nutrition Plan: the [place] +chicken for 95% of the time.  Anti-obesity medications: metformin. Reported side effects: None. Hunger is moderately controlled controlled. Cravings are moderately controlled controlled.  Activity: Walking 2+ miles/yoga for 60 minutes 7 times per week.  Assessment/Plan:   1. Osteoarthritis of other site, unspecified osteoarthritis type Laura Bush has been gardening, and feels this may aggravate her symptoms.  -Start diclofenac (VOLTAREN) 50 MG EC tablet; Take 1 tablet (50 mg total) by mouth 2 (two) times daily.  Dispense: 60 tablet; Refill: 0  2. Insulin resistance At goal. Goal is HgbA1c < 5.7, fasting insulin closer to 5.  Medication: metformin 500 mg twice daily.  She will continue to focus on protein-rich, low simple carbohydrate foods. We reviewed the importance of hydration, regular exercise for stress reduction, and restorative sleep.   Lab Results  Component Value Date   HGBA1C 5.2 04/06/2020   Lab Results  Component Value Date   INSULIN 12.6 10/17/2019   -Refill metFORMIN (GLUCOPHAGE) 500 MG tablet; Take 1 tablet (500 mg total) by mouth 2 (two) times daily.  Dispense: 180 tablet; Refill: 0  3. Essential  hypertension At goal. Medications: Coreg, HCTZ, Micardis. Plan: Avoid buying foods that are: processed, frozen, or prepackaged to avoid excess salt. The current medical regimen is effective;  continue present plan and medications.  BP Readings from Last 3 Encounters:  06/15/20 113/71  05/19/20 130/84  04/29/20 124/76   Lab Results  Component Value Date   CREATININE 0.81 04/06/2020   4. Visceral obesity Current visceral fat rating: 14. Visceral fat rating should be < 13. Visceral adipose tissue is a hormonally active component of total body fat. This body composition phenotype is associated with medical disorders such as metabolic syndrome, cardiovascular disease and several malignancies including prostate, breast, and colorectal cancers. Starting goal: Lose 7-10% of starting weight. Since she has already met this goal, we will keep focusing on labs and decreasing visceral fat rating.  5. Class 1 obesity with serious comorbidity and body mass index (BMI) of 33.0 to 33.9 in adult, unspecified obesity type  Course: Laura Bush is currently in the action stage of change. As such, her goal is to continue with weight loss efforts.   Nutrition goals: She has agreed to the [facility] +chicken.   Exercise goals: For substantial health benefits, adults should do at least 150 minutes (2 hours and 30 minutes) a week of moderate-intensity, or 75 minutes (1 hour and 15 minutes) a week of vigorous-intensity aerobic physical activity, or an equivalent combination of moderate- and vigorous-intensity aerobic activity. Aerobic activity should be performed in episodes of at least 10 minutes, and preferably, it should be spread throughout the week.  Behavioral modification strategies: increasing lean  protein intake, decreasing simple carbohydrates, increasing vegetables and increasing water intake.  Laura Bush has agreed to follow-up with our clinic in 4 weeks. She was informed of the importance of frequent  follow-up visits to maximize her success with intensive lifestyle modifications for her multiple health conditions.   Objective:   Blood pressure 113/71, pulse (!) 53, temperature 97.8 F (36.6 C), temperature source Oral, height 4' 11"  (1.499 m), weight 166 lb (75.3 kg), SpO2 98 %. Body mass index is 33.53 kg/m.  General: Cooperative, alert, well developed, in no acute distress. HEENT: Conjunctivae and lids unremarkable. Cardiovascular: Regular rhythm.  Lungs: Normal work of breathing. Neurologic: No focal deficits.   Lab Results  Component Value Date   CREATININE 0.81 04/06/2020   BUN 26 (H) 04/06/2020   NA 141 04/06/2020   K 4.3 04/06/2020   CL 106 04/06/2020   CO2 27 04/06/2020   Lab Results  Component Value Date   ALT 36 10/03/2019   AST 25 10/03/2019   ALKPHOS 39 10/03/2019   BILITOT 0.4 10/03/2019   Lab Results  Component Value Date   HGBA1C 5.2 04/06/2020   HGBA1C 5.6 04/03/2019   HGBA1C 5.6 03/28/2018   Lab Results  Component Value Date   INSULIN 12.6 10/17/2019   Lab Results  Component Value Date   TSH 1.370 10/17/2019   Lab Results  Component Value Date   CHOL 143 04/06/2020   HDL 43 (L) 04/06/2020   LDLCALC 70 04/06/2020   TRIG 210 (H) 04/06/2020   CHOLHDL 3.3 04/06/2020   Lab Results  Component Value Date   WBC 5.7 12/12/2019   HGB 13.0 12/12/2019   HCT 38.9 12/12/2019   MCV 98 (H) 12/12/2019   PLT 170 12/12/2019   Lab Results  Component Value Date   IRON 120 10/17/2019   TIBC 295 10/17/2019   FERRITIN 206 (H) 12/12/2019   Obesity Behavioral Intervention:   Approximately 15 minutes were spent on the discussion below.  ASK: We discussed the diagnosis of obesity with Laura Bush today and Laura Bush agreed to give [place] permission to discuss obesity behavioral modification therapy today.  ASSESS: Laura Bush has the diagnosis of obesity and her BMI today is 33.6. Laura Bush is in the action stage of change.   ADVISE: Laura Bush was educated on the  multiple health risks of obesity as well as the benefit of weight loss to improve her health. She was advised of the need for long term treatment and the importance of lifestyle modifications to improve her current health and to decrease her risk of future health problems.  AGREE: Multiple dietary modification options and treatment options were discussed and Laura Bush agreed to follow the recommendations documented in the above note.  ARRANGE: Laura Bush was educated on the importance of frequent visits to treat obesity as outlined per CMS and USPSTF guidelines and agreed to schedule her next follow up appointment today.  Attestation Statements:   Reviewed by clinician on day of visit: allergies, medications, problem list, medical history, surgical history, family history, social history, and previous encounter notes.  I, Water quality scientist, CMA, am acting as transcriptionist for Briscoe Deutscher, DO  I have reviewed the above documentation for accuracy and completeness, and I agree with the above. Briscoe Deutscher, DO

## 2020-06-16 ENCOUNTER — Encounter: Payer: Self-pay | Admitting: Oncology

## 2020-06-16 ENCOUNTER — Other Ambulatory Visit: Payer: Self-pay

## 2020-06-16 ENCOUNTER — Inpatient Hospital Stay: Payer: Medicare HMO | Attending: Oncology | Admitting: Oncology

## 2020-06-16 ENCOUNTER — Inpatient Hospital Stay: Payer: Medicare HMO

## 2020-06-16 VITALS — BP 122/70 | HR 59 | Temp 97.5°F | Resp 18 | Ht 59.0 in | Wt 173.2 lb

## 2020-06-16 DIAGNOSIS — Z170 Estrogen receptor positive status [ER+]: Secondary | ICD-10-CM

## 2020-06-16 DIAGNOSIS — I10 Essential (primary) hypertension: Secondary | ICD-10-CM | POA: Insufficient documentation

## 2020-06-16 DIAGNOSIS — R232 Flushing: Secondary | ICD-10-CM

## 2020-06-16 DIAGNOSIS — Z79810 Long term (current) use of selective estrogen receptor modulators (SERMs): Secondary | ICD-10-CM | POA: Insufficient documentation

## 2020-06-16 DIAGNOSIS — Z923 Personal history of irradiation: Secondary | ICD-10-CM | POA: Diagnosis not present

## 2020-06-16 DIAGNOSIS — Z7984 Long term (current) use of oral hypoglycemic drugs: Secondary | ICD-10-CM | POA: Diagnosis not present

## 2020-06-16 DIAGNOSIS — C50812 Malignant neoplasm of overlapping sites of left female breast: Secondary | ICD-10-CM

## 2020-06-16 DIAGNOSIS — C50912 Malignant neoplasm of unspecified site of left female breast: Secondary | ICD-10-CM | POA: Insufficient documentation

## 2020-06-16 DIAGNOSIS — Z79899 Other long term (current) drug therapy: Secondary | ICD-10-CM | POA: Diagnosis not present

## 2020-06-16 LAB — CMP (CANCER CENTER ONLY)
ALT: 21 U/L (ref 0–44)
AST: 19 U/L (ref 15–41)
Albumin: 3.9 g/dL (ref 3.5–5.0)
Alkaline Phosphatase: 34 U/L — ABNORMAL LOW (ref 38–126)
Anion gap: 5 (ref 5–15)
BUN: 26 mg/dL — ABNORMAL HIGH (ref 8–23)
CO2: 31 mmol/L (ref 22–32)
Calcium: 9.5 mg/dL (ref 8.9–10.3)
Chloride: 103 mmol/L (ref 98–111)
Creatinine: 0.91 mg/dL (ref 0.44–1.00)
GFR, Estimated: >60 mL/min (ref >60)
Glucose, Bld: 123 mg/dL — ABNORMAL HIGH (ref 70–99)
Potassium: 4.7 mmol/L (ref 3.5–5.1)
Sodium: 139 mmol/L (ref 135–145)
Total Bilirubin: 0.4 mg/dL (ref 0.3–1.2)
Total Protein: 6.8 g/dL (ref 6.5–8.1)

## 2020-06-16 LAB — CBC WITH DIFFERENTIAL (CANCER CENTER ONLY)
Abs Immature Granulocytes: 0.01 10*3/uL (ref 0.00–0.07)
Basophils Absolute: 0 10*3/uL (ref 0.0–0.1)
Basophils Relative: 1 %
Eosinophils Absolute: 0.2 10*3/uL (ref 0.0–0.5)
Eosinophils Relative: 4 %
HCT: 37.1 % (ref 36.0–46.0)
Hemoglobin: 12.5 g/dL (ref 12.0–15.0)
Immature Granulocytes: 0 %
Lymphocytes Relative: 37 %
Lymphs Abs: 1.8 10*3/uL (ref 0.7–4.0)
MCH: 32.8 pg (ref 26.0–34.0)
MCHC: 33.7 g/dL (ref 30.0–36.0)
MCV: 97.4 fL (ref 80.0–100.0)
Monocytes Absolute: 0.5 10*3/uL (ref 0.1–1.0)
Monocytes Relative: 9 %
Neutro Abs: 2.5 10*3/uL (ref 1.7–7.7)
Neutrophils Relative %: 49 %
Platelet Count: 178 10*3/uL (ref 150–400)
RBC: 3.81 MIL/uL — ABNORMAL LOW (ref 3.87–5.11)
RDW: 11.9 % (ref 11.5–15.5)
WBC Count: 5 10*3/uL (ref 4.0–10.5)
nRBC: 0 % (ref 0.0–0.2)

## 2020-06-16 MED ORDER — GABAPENTIN 300 MG PO CAPS: 300.0000 mg | ORAL_CAPSULE | Freq: Every day | ORAL | 1 refills | Status: DC

## 2020-06-16 NOTE — Progress Notes (Signed)
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  She continues on tamoxifen.  She tolerates this remarkably well.  S she is looking forward however to getting off at once and for all at the end of next month  Since her last visit, she underwent breast MRI on 04/24/2020 showing: breast composition B; no evidence of malignancy in either breast.   REVIEW OF SYSTEMS: [name] has joined the health and weight loss program at [facility] and lost 30 pounds.  Her blood pressure is better.  She has more energy.  She and her husband (who is in the same program) walk about 2 miles just about every day.  A detailed review of systems today was otherwise stable   COVID 19 VACCINATION STATUS: fully vaccinated [employer]) including booster October 2021   BREAST CANCER HISTORY: From the original intake note:  "[name]"  recently moved to the [place] area from [place]. While still there, she had routine screening mammography with tomography 02/14/2014, which showed only a tiny breast cysts. Six-month follow-up exam was performed 08/25/2014, and now there was evidence of a possible focal asymmetry in the left breast. On 08/25/2014 at the [facility] of radiology she underwent a unilateral diagnostic mammography with tomography , and this confirmed a spiculated mass in the left breast superiorly. Sonography of this area found a poorly defined hypoechoic mass measuring 0.7 cm  at the 11:00 position 8 cm from the nipple. There was also an adjacent cluster of cysts with a benign circumcised appearance.   Biopsy of the mass in question was recommended, but as the patient was moving to this area, this was performed here on 09/18/2014. The pathology from that procedure (SAA [id number]) found an invasive ductal carcinoma, grade 2, estrogen receptor 100% positive, with strong staining intensity, progesterone receptor 43% positive, with strong staining intensity, with an MIB-1 of 30%, and no HER-2 amplification.   On 09/26/2014 the patient underwent bilateral breast MRI at [facility], showing a breast density category B. The right breast was unremarkable and there were no lymph nodes of concern. In the upper inner quadrant of the left breast there was an enhancing mass measuring 1.2 cm. In addition there was a 5.5 cm area of linear enhancement extending anteriorly from this mass. 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  The patient's subsequent history is as detailed below   PAST MEDICAL HISTORY: Past Medical History:  Diagnosis Date  . Asthma    in cold weather  . Breast cancer ([place]) 09/18/14   left breast  . Breast cancer ([place]) 10/07/14   bx left breast  . Breast cancer of upper-outer quadrant of left female breast ([place]) 09/22/2014  . Cluster headaches    in her 40's  . GERD (gastroesophageal reflux disease)   . Heart murmur    as a child (has outgrown)  . High cholesterol   . Hypertension   . Lower extremity edema   . Microscopic colitis   . Morbid obesity ([place]) 09/30/2019  .  OSA on CPAP   . Personal history of radiation therapy   . Pneumonia 2000's X 1  . PONV (postoperative nausea and vomiting)   . Rosacea   . S/P radiation therapy 12/30/14-01/27/15   left breast 50Gy total dose  . 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 Swallowing difficulty   . Venous insufficiency     PAST SURGICAL HISTORY: Past Surgical History:  Procedure  Laterality Date  . ABDOMINAL HYSTERECTOMY  1999  . APPENDECTOMY  ~ 2006  . BREAST BIOPSY Left 10/2014  . BREAST LUMPECTOMY Left 2016  . BREAST REDUCTION SURGERY Bilateral 11/11/2014   Procedure: Bilateral Breast Reduction;  Surgeon: Crissie Reese, MD;  Location: [facility];  Service: Plastics;  Laterality: Bilateral;  . CARPAL TUNNEL RELEASE Bilateral    2 surgeries on right, 1 on left  . CESAREAN SECTION  1978; 1980  . COLONOSCOPY    . FOREARM FRACTURE SURGERY Right 2003 X 3  . KNEE ARTHROSCOPY Bilateral    meniscus repair  . PARTIAL MASTECTOMY WITH NEEDLE LOCALIZATION AND AXILLARY SENTINEL LYMPH NODE BX Left 11/11/2014   Procedure: LEFT BREAST PARTIAL MASTECTOMY WITH NEEDLE LOCALIZATION TIMES TWO AND LEFT AXILLARY SENTINEL LYMPH NODE Biopsy;  Surgeon: Fanny Skates, MD;  Location: [facility];  Service: General;  Laterality: Left;  . SQUAMOUS CELL CARCINOMA EXCISION  1980's X 1   "nose"  . TONSILLECTOMY  ~ 1957  . TUBAL LIGATION  ?1984    FAMILY HISTORY: Family History  Problem Relation Age of Onset  . Uterine cancer Sister 54  . Stroke Father        deceased  . Heart failure Father   . Hypertension Father   . Dementia Mother        Lives in [place]  . Hypertension Mother   . Hyperlipidemia Mother   . Heart attack Maternal Grandmother   . Hypertension Maternal Grandfather   . Stroke Paternal Grandfather   . Colon cancer Neg Hx   . Esophageal cancer Neg Hx   . Pancreatic cancer Neg Hx   . Stomach cancer Neg Hx   . Liver disease Neg Hx    The patient's father died at the age of 36 following a stroke. The patient's mother is still living, age 33. GEN had no brothers, one sister. That sister was diagnosed with uterine cancer at the age of 48. There is no other history of breast or ovarian cancer in the family to the patient's knowledge   GYNECOLOGIC HISTORY:  No LMP recorded. Patient has had a hysterectomy.  menarche age 1, first live birth age 62, the patient is [place] P2.  She  underwent total abdominal hysterectomy with bilateral salpingo-oophorectomy in 1999. She took hormone replacement for approximately 4 years. She was also all oral contraceptives  remotelyfor about 8 years with no complications   SOCIAL HISTORY: (Updated February 2021) [name] used to work for an [employer], she is currently retired from being a Pensions consultant. Her husband, Louie Casa still works for an Orthoptist. Their daughter Luane School lives in [place] where she works as an Web designer. Their son Robert [name] lives in [place] where he works as a Education officer, museum. Robert's twins will turn  69 years old May 2021, one boy and one girl. She has 2 additional grandchildren in [place].    ADVANCED DIRECTIVES: In the absence of any documents to the contrary the patient's husband is her healthcare power of attorney   HEALTH MAINTENANCE: Social History  Tobacco Use  . Smoking status: Never Smoker  . Smokeless tobacco: Never Used  Substance Use Topics  . Alcohol use: Yes    Alcohol/week: 5.0 standard drinks    Types: 5 Glasses of wine per week  . Drug use: No    Colonoscopy:  2008  PAP:  Bone density: 2007/"normal"  Lipid panel:  Allergies  Allergen Reactions  . Amlodipine Swelling  . Lanolin   . Tape Other (See Comments)    Redness (Bandaids also)    Current Outpatient Medications  Medication Sig Dispense Refill  . aMILoride (MIDAMOR) 5 MG tablet Take 1 tablet (5 mg total) by mouth daily. 90 tablet 3  . B Complex-C (B-COMPLEX WITH VITAMIN C) tablet Take 1 tablet by mouth daily.    Marland Kitchen CALCIUM CARBONATE-VITAMIN D PO Take 1 tablet by mouth 2 (two) times daily. 624m calcium, D3 (1000)IU    . carvedilol (COREG) 3.125 MG tablet Take 3.125 mg by mouth 2 (two) times daily with a meal.    . diclofenac (VOLTAREN) 50 MG EC tablet Take 1 tablet (50 mg total) by mouth 2 (two) times daily. 60 tablet 0  . Elderberry 575 MG/5ML SYRP Take by mouth daily.    .Marland Kitchen gabapentin (NEURONTIN) 300 MG capsule Take 1 capsule (300 mg total) by mouth at bedtime. 30 capsule 1  . hydrochlorothiazide (MICROZIDE) 12.5 MG capsule Take 1 capsule (12.5 mg total) by mouth daily. 90 capsule 3  . loratadine (CLARITIN) 10 MG tablet Take 10 mg by mouth daily.    . metFORMIN (GLUCOPHAGE) 500 MG tablet Take 1 tablet (500 mg total) by mouth 2 (two) times daily. 180 tablet 0  . pantoprazole (PROTONIX) 40 MG tablet Take 1 tablet (40 mg total) by mouth daily. 90 tablet 3  . simvastatin (ZOCOR) 20 MG tablet Take 1 tablet (20 mg total) by mouth daily. 90 tablet 3  . tamoxifen (NOLVADEX) 20 MG tablet Take 1 tablet (20 mg total) by mouth daily. 90 tablet 3  . telmisartan (MICARDIS) 80 MG tablet Take 0.5 tablets (40 mg total) by mouth daily. 90 tablet 3   No current facility-administered medications for this visit.    OBJECTIVE: White woman who appears well  There were no vitals filed for this visit.   There is no height or weight on file to calculate BMI.    ECOG FS:0 - Asymptomatic  Sclerae unicteric, EOMs intact Wearing a mask No cervical or supraclavicular adenopathy Lungs no rales or rhonchi Heart regular rate and rhythm Abd soft, nontender, positive bowel sounds MSK no focal spinal tenderness, no upper extremity lymphedema Neuro: nonfocal, well oriented, appropriate affect Breasts: The right breast is status post reduction mammoplasty.  It is otherwise unremarkable.  The left breast is status post lumpectomy and radiation with no evidence of recurrence.  Both axillae are benign.   LAB RESULTS:  CMP     Component Value Date/Time   NA 141 04/06/2020 1059   NA 142 05/29/2017 1002   K 4.3 04/06/2020 1059   K 3.9 05/29/2017 1002   CL 106 04/06/2020 1059   CO2 27 04/06/2020 1059   CO2 28 05/29/2017 1002   GLUCOSE 101 (H) 04/06/2020 1059   GLUCOSE 86 05/29/2017 1002   BUN 26 (H) 04/06/2020 1059   BUN 13.5 05/29/2017 1002   CREATININE 0.81 04/06/2020 1059   CREATININE  0.8 05/29/2017 1002   CALCIUM 10.0 04/06/2020 1059   CALCIUM 9.4 05/29/2017 1002   PROT 6.6 10/03/2019 1311  PROT 6.8 05/29/2017 1002   ALBUMIN 3.8 10/03/2019 1311   ALBUMIN 3.7 05/29/2017 1002   AST 25 10/03/2019 1311   AST 18 05/29/2017 1002   ALT 36 10/03/2019 1311   ALT 19 05/29/2017 1002   ALKPHOS 39 10/03/2019 1311   ALKPHOS 39 (L) 05/29/2017 1002   BILITOT 0.4 10/03/2019 1311   BILITOT 0.48 05/29/2017 1002   GFRNONAA >60 10/03/2019 1311   GFRAA >60 10/03/2019 1311    INo results found for: SPEP, UPEP  Lab Results  Component Value Date   WBC 5.0 06/16/2020   NEUTROABS 2.5 06/16/2020   HGB 12.5 06/16/2020   HCT 37.1 06/16/2020   MCV 97.4 06/16/2020   PLT 178 06/16/2020      Chemistry      Component Value Date/Time   NA 141 04/06/2020 1059   NA 142 05/29/2017 1002   K 4.3 04/06/2020 1059   K 3.9 05/29/2017 1002   CL 106 04/06/2020 1059   CO2 27 04/06/2020 1059   CO2 28 05/29/2017 1002   BUN 26 (H) 04/06/2020 1059   BUN 13.5 05/29/2017 1002   CREATININE 0.81 04/06/2020 1059   CREATININE 0.8 05/29/2017 1002      Component Value Date/Time   CALCIUM 10.0 04/06/2020 1059   CALCIUM 9.4 05/29/2017 1002   ALKPHOS 39 10/03/2019 1311   ALKPHOS 39 (L) 05/29/2017 1002   AST 25 10/03/2019 1311   AST 18 05/29/2017 1002   ALT 36 10/03/2019 1311   ALT 19 05/29/2017 1002   BILITOT 0.4 10/03/2019 1311   BILITOT 0.48 05/29/2017 1002      No results found for: LABCA2  No components found for: LABCA125  No results for input(s): INR in the last 168 hours.  Urinalysis    Component Value Date/Time   COLORURINE YELLOW 11/05/2014 1142   APPEARANCEUR CLEAR 11/05/2014 1142   LABSPEC 1.008 11/05/2014 1142   PHURINE 7.0 11/05/2014 1142   GLUCOSEU NEGATIVE 11/05/2014 1142   HGBUR NEGATIVE 11/05/2014 1142   BILIRUBINUR NEGATIVE 11/05/2014 1142   KETONESUR NEGATIVE 11/05/2014 1142   PROTEINUR NEGATIVE 11/05/2014 1142   UROBILINOGEN 0.2 11/05/2014 1142   NITRITE  NEGATIVE 11/05/2014 1142   LEUKOCYTESUR NEGATIVE 11/05/2014 1142    STUDIES: No results found.   ASSESSMENT: 69 y.o. [place], [place] woman status post left breast upper outer quadrant biopsy 09/18/2014 for a clinical T1 cN0 invasive ductal carcinoma, grade 2, estrogen and progesterone receptor positive, HER-2 not amplified, with an MIB-1 of 30%  (1) there is a 5.5 cm area of non-masslike enhancement seen in the left breast with biopsy 10/07/2014 showing atypical ductal hyperplasia  (2) status post left lumpectomy and sentinel lymph node sampling 11/11/2014 for a  pT1c  pN0, stage IA invasive ductal carcinoma, grade 3, repeat HER-2 again negative, with negative margins  (3) an Oncotype DX score of 11 predicts an 8% risk of outside the breast recurrence within 10 years if the patient's only systemic therapy is tamoxifen for 5 years. It also predicts no benefit from chemotherapy  (4) Pathology from right reduction mammoplasty 11/11/2014 unexpectedly showed ductal carcinoma in situ, estrogen receptor 100% positive, progesterone receptor 71% positive  (5) adjuvant radiation completed 01/27/2015  (6) anastrozole started June 2016, stopped 05/28/15 due to intolerance  (a) bone density 02/17/2015 was normal with a T score of -0.9  (b) bone density 07/26/2018 shows a T score of -1.3.  (7) tamoxifen started 07/14/15, completing 5 years December 2021  (8) because of the incomplete  resection on the right breast, bilateral yearly breast MRI are recommended, in addition to mammography.  (a) bilateral breast MRI 10/16/2015 was negative  (b) bilateral breast MRI 02/03/2017 showed no suspicious findings  (c) bilateral breast MRI 03/07/2018 showed no evidence of malignancy.  (d) bilateral breast MRI 03/20/2019 again shows benign changes  (e) bilateral breast MRI on 04/24/2020 again benign   PLAN: [name] is now 5-1/2 years out from definitive surgery for her breast cancer with no evidence of disease  recurrence.  This is very favorable.  She will complete 5 years of tamoxifen in a month.  She can simply stop the medication.  She does not have to taper to off.  She has tolerated it well but nevertheless I think she will feel even better off the medication.  I commended her participation in the weight loss and exercise program.  She and her husband both are greatly benefiting from it  At this point I feel comfortable releasing her to her primary physician's care..  All she will need in terms of breast cancer follow-up is a yearly physician breast exam and yearly mammography.  I will be glad to see Kaydon again at any point in the future if and when the need arises but as of now are making no further routine appointments for her here.  Total encounter time 20 minutes.   Nawaf Strange, Virgie Dad, MD  06/16/20 1:10 PM Medical Oncology and Hematology [facility] [address] [place], [place] [address] Tel. [phone]    Fax. [phone]   I, Wilburn Mylar, am acting as scribe for Dr. Virgie Dad. Liridona Mashaw.  I, Lurline Del MD, have reviewed the above documentation for accuracy and completeness, and I agree with the above.   *Total Encounter Time as defined by the Centers for Medicare and Medicaid Services includes, in addition to the face-to-face time of a patient visit (documented in the note above) non-face-to-face time: obtaining and reviewing outside history, ordering and reviewing medications, tests or procedures, care coordination (communications with other health care professionals or caregivers) and documentation in the medical record.

## 2020-06-18 ENCOUNTER — Telehealth: Payer: Self-pay | Admitting: Oncology

## 2020-06-18 NOTE — Telephone Encounter (Signed)
No 11/9 los, no changes made to pt schedule

## 2020-07-01 DIAGNOSIS — M67449 Ganglion, unspecified hand: Secondary | ICD-10-CM | POA: Diagnosis not present

## 2020-07-03 ENCOUNTER — Encounter: Payer: Self-pay | Admitting: Family Medicine

## 2020-07-04 MED ORDER — CARVEDILOL 3.125 MG PO TABS: 3.1250 mg | ORAL_TABLET | Freq: Two times a day (BID) | ORAL | 2 refills | Status: DC

## 2020-07-13 ENCOUNTER — Ambulatory Visit (INDEPENDENT_AMBULATORY_CARE_PROVIDER_SITE_OTHER): Payer: Medicare HMO | Admitting: Family Medicine

## 2020-07-13 ENCOUNTER — Encounter (INDEPENDENT_AMBULATORY_CARE_PROVIDER_SITE_OTHER): Payer: Self-pay | Admitting: Family Medicine

## 2020-07-13 ENCOUNTER — Other Ambulatory Visit: Payer: Self-pay

## 2020-07-13 VITALS — BP 158/75 | HR 51 | Temp 98.1°F | Ht 59.0 in | Wt 168.0 lb

## 2020-07-13 DIAGNOSIS — E8881 Metabolic syndrome: Secondary | ICD-10-CM

## 2020-07-13 DIAGNOSIS — G4733 Obstructive sleep apnea (adult) (pediatric): Secondary | ICD-10-CM

## 2020-07-13 DIAGNOSIS — Z853 Personal history of malignant neoplasm of breast: Secondary | ICD-10-CM

## 2020-07-13 DIAGNOSIS — Z9989 Dependence on other enabling machines and devices: Secondary | ICD-10-CM

## 2020-07-13 DIAGNOSIS — E669 Obesity, unspecified: Secondary | ICD-10-CM

## 2020-07-13 DIAGNOSIS — Z6833 Body mass index (BMI) 33.0-33.9, adult: Secondary | ICD-10-CM

## 2020-07-13 DIAGNOSIS — E65 Localized adiposity: Secondary | ICD-10-CM | POA: Diagnosis not present

## 2020-07-13 NOTE — Progress Notes (Signed)
Chief Complaint:   OBESITY Laura Bush is here to discuss her progress with her obesity treatment plan along with follow-up of her obesity related diagnoses.   Today's visit was #: 11 Starting weight: 196 lbs Starting date: 10/17/2019 Today's weight: 168 lbs Today's date: 07/13/2020 Total lbs lost to date: 28 lbs Body mass index is 33.93 kg/m.  Total weight loss percentage to date: -14.29%  Interim History: Laura Bush has done very well over the holiday season so far.  She did thoroughly enjoy Thanksgiving with her family.  She has gained 2 pounds of water weight since her last visit.  Of note, her blood pressure is elevated today.  Laura Bush thinks that this is due to her diclofenac.  It happened before with Celebrex.  She stopped taking diclofenac today and has plan to change to ibuprofen as needed.  This has not affected her blood pressure in the past.  We did review her last visit with oncology together.  She is now off of tamoxifen as well as gabapentin.  We celebrated this together.  Nutrition Plan: the [place] plus chicken for 90% of the time.  Hunger is well controlled.  Cravings are well controlled.  Activity: Walking for 2 miles/yoga for 2-3 times per week. Sleep: Sleep is restful.   Assessment/Plan:   1. Insulin resistance At goal. Goal is HgbA1c < 5.7, fasting insulin closer to 5.  Medication: metformin 500 mg twice daily.  She will continue to focus on protein-rich, low simple carbohydrate foods. We reviewed the importance of hydration, regular exercise for stress reduction, and restorative sleep.   Lab Results  Component Value Date   HGBA1C 5.2 04/06/2020   Lab Results  Component Value Date   INSULIN 12.6 10/17/2019   2. Visceral obesity Current visceral fat rating: 14. Visceral fat rating should be < 13. Visceral adipose tissue is a hormonally active component of total body fat. This body composition phenotype is associated with medical disorders such as metabolic  syndrome, cardiovascular disease and several malignancies including prostate, breast, and colorectal cancers. Starting goal: Lose 7-10% of starting weight.   3. OSA on CPAP OSA is a cause of systemic hypertension and is associated with an increased incidence of stroke, heart failure, atrial fibrillation, and coronary heart disease. Severe OSA increases all-cause mortality and  cardiovascular mortality.   Goal: Treatment of OSA via CPAP compliance and weight loss. . Plasma ghrelin levels (appetite or "hunger hormone") are significantly higher in OSA patients than in BMI-matched controls, but decrease to levels similar to those of obese patients without OSA after CPAP treatment.  . Weight loss improves OSA by several mechanisms, including reduction in fatty tissue in the throat (i.e. parapharyngeal fat) and the tongue. Loss of abdominal fat increases mediastinal traction on the upper airway making it less likely to collapse during sleep. . Studies have also shown that compliance with CPAP treatment improves leptin (hunger inhibitory hormone) imbalance.  4. History of breast cancer Laura Bush has finished Tamoxifen! She no longer needs gabapentin to combat the side effects as well.  5. Class 1 obesity with serious comorbidity and body mass index (BMI) of 33.0 to 33.9 in adult, unspecified obesity type  Course: Laura Bush is currently in the action stage of change. As such, her goal is to continue with weight loss efforts.   Nutrition goals: She has agreed to the [employer] plus chicken.   Exercise goals: For substantial health benefits, adults should do at least 150 minutes (2 hours and  30 minutes) a week of moderate-intensity, or 75 minutes (1 hour and 15 minutes) a week of vigorous-intensity aerobic physical activity, or an equivalent combination of moderate- and vigorous-intensity aerobic activity. Aerobic activity should be performed in episodes of at least 10 minutes, and preferably, it should be  spread throughout the week.  Behavioral modification strategies: dealing with family or coworker sabotage, holiday eating strategies  and celebration eating strategies.  Laura Bush has agreed to follow-up with our clinic in 4 weeks. She was informed of the importance of frequent follow-up visits to maximize her success with intensive lifestyle modifications for her multiple health conditions.   Objective:   Blood pressure (!) 158/75, pulse (!) 51, temperature 98.1 F (36.7 C), temperature source Oral, height 4\' 11"  (1.499 m), weight 168 lb (76.2 kg), SpO2 98 %. Body mass index is 33.93 kg/m.  General: Cooperative, alert, well developed, in no acute distress. HEENT: Conjunctivae and lids unremarkable. Cardiovascular: Regular rhythm.  Lungs: Normal work of breathing. Neurologic: No focal deficits.   Lab Results  Component Value Date   CREATININE 0.91 06/16/2020   BUN 26 (H) 06/16/2020   NA 139 06/16/2020   K 4.7 06/16/2020   CL 103 06/16/2020   CO2 31 06/16/2020   Lab Results  Component Value Date   ALT 21 06/16/2020   AST 19 06/16/2020   ALKPHOS 34 (L) 06/16/2020   BILITOT 0.4 06/16/2020   Lab Results  Component Value Date   HGBA1C 5.2 04/06/2020   HGBA1C 5.6 04/03/2019   HGBA1C 5.6 03/28/2018   Lab Results  Component Value Date   INSULIN 12.6 10/17/2019   Lab Results  Component Value Date   TSH 1.370 10/17/2019   Lab Results  Component Value Date   CHOL 143 04/06/2020   HDL 43 (L) 04/06/2020   LDLCALC 70 04/06/2020   TRIG 210 (H) 04/06/2020   CHOLHDL 3.3 04/06/2020   Lab Results  Component Value Date   WBC 5.0 06/16/2020   HGB 12.5 06/16/2020   HCT 37.1 06/16/2020   MCV 97.4 06/16/2020   PLT 178 06/16/2020   Lab Results  Component Value Date   IRON 120 10/17/2019   TIBC 295 10/17/2019   FERRITIN 206 (H) 12/12/2019   Obesity Behavioral Intervention:   Approximately 15 minutes were spent on the discussion below.  ASK: We discussed the diagnosis of  obesity with Laura Bush today and Laura Bush agreed to give [place] permission to discuss obesity behavioral modification therapy today.  ASSESS: Laura Bush has the diagnosis of obesity and her BMI today is 33.9. Laura Bush is in the action stage of change.   ADVISE: Laura Bush was educated on the multiple health risks of obesity as well as the benefit of weight loss to improve her health. She was advised of the need for long term treatment and the importance of lifestyle modifications to improve her current health and to decrease her risk of future health problems.  AGREE: Multiple dietary modification options and treatment options were discussed and Laura Bush agreed to follow the recommendations documented in the above note.  ARRANGE: Laura Bush was educated on the importance of frequent visits to treat obesity as outlined per CMS and USPSTF guidelines and agreed to schedule her next follow up appointment today.  Attestation Statements:   Reviewed by clinician on day of visit: allergies, medications, problem list, medical history, surgical history, family history, social history, and previous encounter notes.  I, Water quality scientist, CMA, am acting as transcriptionist for Briscoe Deutscher, DO  I have reviewed  the above documentation for accuracy and completeness, and I agree with the above. Briscoe Deutscher, DO

## 2020-07-17 ENCOUNTER — Telehealth: Payer: Self-pay | Admitting: Pharmacist

## 2020-07-17 NOTE — Progress Notes (Addendum)
Chronic Care Management Pharmacy Assistant   Name: Laura Bush  MRN: [id number] DOB: 01-30-1951  Reason for Encounter: Disease State  Patient Questions:  1.  Have you seen any other providers since your last visit?Yes  2.  Any changes in your medicines or health? Yes    PCP : Copland, Gay Filler, MD   Their chronic conditions include: HTN, HLD, Metabolic Syndrome, GERD, Allergic Rhinitis, Osteopenia, Hx of Breast Cancer  Office Visits:   Consults: 07-13-2020 ([facility]) Patient presented in the office to discuss obesity treatment plan. Her BP was elevated which the patient believes is stemming from the diclofenac. She has stopped taking the medication and started Ibuprofen.  07-01-2020  (Ortho) Initial consult for mucous cyst of finger with Dr. Lorenso Courier  06-16-2020 (Oncology) Patient presented in the office for bilateral breast cancer follow up. Notes indicate the patient no longer needs to be under the treatment of the oncologist. Tamoxifen Citrate 20 mg tab have been discontinued.  06-15-2020 (Weight Mangt) Patient presented in the office to discuss obesity treatment plan. Started Diclofenac 50 mg tab; one two times daily for osteoarthritis  05-20-2020 (Ortho) Initial consult for mucous cyst of finger with Dr. Lorenso Courier. X-ray of the thumb taken.  05-19-2020 (Weight Mangt) Patient presented in the office to discuss obesity treatment plan. No medication changes.   Allergies:   Allergies  Allergen Reactions   Amlodipine Swelling   Lanolin    Tape Other (See Comments)    Redness (Bandaids also)    Medications: Outpatient Encounter Medications as of 07/17/2020  Medication Sig   aMILoride (MIDAMOR) 5 MG tablet Take 1 tablet (5 mg total) by mouth daily.   B Complex-C (B-COMPLEX WITH VITAMIN C) tablet Take 1 tablet by mouth daily.   CALCIUM CARBONATE-VITAMIN D PO Take 1 tablet by mouth 2 (two) times daily. 600mg  calcium, D3 (1000)IU   carvedilol (COREG)  3.125 MG tablet Take 1 tablet (3.125 mg total) by mouth 2 (two) times daily with a meal.   Elderberry 575 MG/5ML SYRP Take by mouth daily.   gabapentin (NEURONTIN) 300 MG capsule Take 1 capsule (300 mg total) by mouth at bedtime.   hydrochlorothiazide (MICROZIDE) 12.5 MG capsule Take 1 capsule (12.5 mg total) by mouth daily.   loratadine (CLARITIN) 10 MG tablet Take 10 mg by mouth daily.   metFORMIN (GLUCOPHAGE) 500 MG tablet Take 1 tablet (500 mg total) by mouth 2 (two) times daily.   pantoprazole (PROTONIX) 40 MG tablet Take 1 tablet (40 mg total) by mouth daily.   simvastatin (ZOCOR) 20 MG tablet Take 1 tablet (20 mg total) by mouth daily.   telmisartan (MICARDIS) 80 MG tablet Take 0.5 tablets (40 mg total) by mouth daily.   No facility-administered encounter medications on file as of 07/17/2020.    Current Diagnosis: Patient Active Problem List   Diagnosis Date Noted   Metabolic syndrome 60/05/9322   Mixed hyperlipidemia 04/21/2020   Polyp of ascending colon 04/21/2020   History of breast cancer, Tamoxifen, nearing five years, Dr. 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 Spoke with patient regarding BP  Recent Office Vitals: BP Readings from Last 3 Encounters:  07/13/20 (!) 158/75  06/16/20 122/70  06/15/20 113/71   Pulse Readings from Last 3 Encounters:  07/13/20 (!) 51  06/16/20 (!) 59   06/15/20 (!) 53    Wt Readings from Last 3 Encounters:  07/13/20 168 lb (76.2 kg)  06/16/20 173 lb 3.2 oz (78.6 kg)  06/15/20 166 lb (75.3 kg)     Kidney Function Lab Results  Component Value Date/Time   CREATININE 0.91 06/16/2020 12:56 PM   CREATININE 0.81 04/06/2020 10:59 AM   CREATININE 0.85 10/03/2019 01:11 PM   CREATININE 0.8 05/29/2017 10:02 AM   CREATININE 0.8 05/30/2016 11:11 AM   GFR 63.00 08/13/2019 09:33 AM   GFRNONAA >60 06/16/2020 12:56 PM   GFRAA >60 10/03/2019 01:11 PM    BMP Latest Ref Rng & Units 06/16/2020 04/06/2020 10/03/2019  Glucose 70 - 99 mg/dL 123(H) 101(H) 101(H)  BUN 8 - 23 mg/dL 26(H) 26(H) 16  Creatinine 0.44 - 1.00 mg/dL 0.91 0.81 0.85  BUN/Creat Ratio 6 - 22 (calc) - 32(H) -  Sodium 135 - 145 mmol/L 139 141 145  Potassium 3.5 - 5.1 mmol/L 4.7 4.3 3.8  Chloride 98 - 111 mmol/L 103 106 107  CO2 22 - 32 mmol/L 31 27 25   Calcium 8.9 - 10.3 mg/dL 9.5 10.0 9.0    Current antihypertensive regimen:  Telmisartan 80mg  1/2 tab daily Hydrochlorothiazide 25mg  1/2 tab daily Carvedilol 6.25mg  tab twice daily (changed to this regimen one week ago) Amiloride 5mg  daily  How often are you checking your Blood Pressure? daily   Current home BP readings: 128/76 on 12-11, 159/97 on 12-10 (evening), 144/96 on 12-10 (afternoon), 134/87 (morning), 126/77 on 12-9.  What recent interventions/DTPs have been made by any provider to improve Blood Pressure control since last CPP Visit: Carvedilol has been increased to 6.25 mg tab twice daily from a previous adjustment of 3.125 mg tab twice daily  Any recent hospitalizations or ED visits since last visit with CPP? Yes   What diet changes have been made to improve Blood Pressure Control?  Is involved a weight management and following a pescatarian diet with chicken.   What exercise is being done to improve your Blood Pressure Control?  Walks about two miles daily   Adherence Review: Is the patient currently on  ACE/ARB medication? Yes Telmisartan 80mg  1/2 tab daily Does the patient have >5 day gap between last estimated fill dates? No ???   Follow-Up:  Pharmacist Review   Fanny Skates, [place] Pharmacist Assistant [phone]  Telmisartan not listed in dispense history. Will need to verify last fill date to assure adherence.   Called pt to verify last fill date. States she has #40 tabs of telmisartan 80mg  Filled 12/23/19 for #1 tab daily for 90 DS (#90). She states she had a surplus prior to last delivery and noting she was moved to 1/2 tab this is why she has not filled medication since May.  8 minutes spent in review, coordination, call, and documentation.   Reviewed by: De Blanch, PharmD, BCACP Clinical Pharmacist [facility] [phone]

## 2020-08-05 ENCOUNTER — Other Ambulatory Visit: Payer: Self-pay | Admitting: Oncology

## 2020-08-05 DIAGNOSIS — Z1231 Encounter for screening mammogram for malignant neoplasm of breast: Secondary | ICD-10-CM

## 2020-08-17 ENCOUNTER — Other Ambulatory Visit: Payer: Self-pay

## 2020-08-17 ENCOUNTER — Ambulatory Visit (INDEPENDENT_AMBULATORY_CARE_PROVIDER_SITE_OTHER): Payer: Medicare HMO | Admitting: Family Medicine

## 2020-08-17 ENCOUNTER — Encounter (INDEPENDENT_AMBULATORY_CARE_PROVIDER_SITE_OTHER): Payer: Self-pay | Admitting: Family Medicine

## 2020-08-17 VITALS — BP 124/78 | HR 57 | Temp 98.0°F | Ht 59.0 in | Wt 167.0 lb

## 2020-08-17 DIAGNOSIS — E669 Obesity, unspecified: Secondary | ICD-10-CM | POA: Diagnosis not present

## 2020-08-17 DIAGNOSIS — E8881 Metabolic syndrome: Secondary | ICD-10-CM

## 2020-08-17 DIAGNOSIS — I10 Essential (primary) hypertension: Secondary | ICD-10-CM | POA: Diagnosis not present

## 2020-08-17 DIAGNOSIS — E782 Mixed hyperlipidemia: Secondary | ICD-10-CM | POA: Diagnosis not present

## 2020-08-17 DIAGNOSIS — M8580 Other specified disorders of bone density and structure, unspecified site: Secondary | ICD-10-CM

## 2020-08-17 DIAGNOSIS — Z6833 Body mass index (BMI) 33.0-33.9, adult: Secondary | ICD-10-CM

## 2020-08-19 NOTE — Progress Notes (Signed)
Chief Complaint:   OBESITY Laura Bush is here to discuss her progress with her obesity treatment plan along with follow-up of her obesity related diagnoses.   Today's visit was #: 12 Starting weight: 196 lbs Starting date: 10/17/2019 Today's weight: 167 lbs Today's date: 08/17/2020 Total lbs lost to date: 29 lbs Body mass index is 33.73 kg/m.  Total weight loss percentage to date: -14.80%  Interim History: Laura Bush is 29 pounds down today.  She says she is starting a new hike challenge, doing [place] to the [facility], using the [employer], and doing [place]. Nutrition Plan: the [place] with chicken 95% of the time.  Hunger is well controlled. Cravings are well controlled.  Activity: Walking 2 miles/yoga 5 times per week.  Assessment/Plan:   1. Essential hypertension At goal. Medications: Coreg 6.25 mg daily, Micardis 40 mg daily, HCTZ 12.5 mg daily.  Coreg was recently increased.    Plan: Avoid buying foods that are: processed, frozen, or prepackaged to avoid excess salt. We will continue to monitor symptoms as they relate to her weight loss journey.  Will monitor.  BP Readings from Last 3 Encounters:  08/17/20 124/78  07/13/20 (!) 158/75  06/16/20 122/70   Lab Results  Component Value Date   CREATININE 0.91 06/16/2020   2. Osteopenia, unspecified location She is due for DEXA.  She has an appointment scheduled in February.   3. Mixed hyperlipidemia Lipid-lowering medications: Zocor 20 mg daily.   Plan: Dietary changes: Increase soluble fiber. Decrease simple carbohydrates. Exercise changes: An average 40 minutes of moderate to vigorous-intensity aerobic activity 3 or 4 times per week.   Lab Results  Component Value Date   CHOL 143 04/06/2020   HDL 43 (L) 04/06/2020   LDLCALC 70 04/06/2020   TRIG 210 (H) 04/06/2020   CHOLHDL 3.3 04/06/2020   Lab Results  Component Value Date   ALT 21 06/16/2020   AST 19 06/16/2020   ALKPHOS 34 (L)  06/16/2020   BILITOT 0.4 06/16/2020   The 10-year ASCVD risk score Mikey Bussing DC Jr., et al., 2013) is: 18.6%   Values used to calculate the score:     Age: 70 years     Sex: Female     Is Non-Hispanic African American: No     Diabetic: Yes     Tobacco smoker: No     Systolic Blood Pressure: 390 mmHg     Is BP treated: Yes     HDL Cholesterol: 43 mg/dL     Total Cholesterol: 143 mg/dL  4. Metabolic syndrome Starting goal: Lose 7-10% of starting weight. She will continue to focus on protein-rich, low simple carbohydrate foods. We reviewed the importance of hydration, regular exercise for stress reduction, and restorative sleep.  We will continue to check lab work every 3 months, with 10% weight loss, or should any other concerns arise.  5. Class 1 obesity with serious comorbidity and body mass index (BMI) of 33.0 to 33.9 in adult, unspecified obesity type  Course: Laura Bush is currently in the action stage of change. As such, her goal is to continue with weight loss efforts.   Nutrition goals: She has agreed to the [employer].   Exercise goals: As is.  Behavioral modification strategies: increasing lean protein intake, decreasing simple carbohydrates, increasing vegetables, increasing water intake and decreasing liquid calories.  Laura Bush has agreed to follow-up with our clinic in 4 weeks. She was informed of the importance of frequent follow-up visits  to maximize her success with intensive lifestyle modifications for her multiple health conditions.   Objective:   Blood pressure 124/78, pulse (!) 57, temperature 98 F (36.7 C), temperature source Oral, height 4\' 11"  (1.499 m), weight 167 lb (75.8 kg), SpO2 97 %. Body mass index is 33.73 kg/m.  General: Cooperative, alert, well developed, in no acute distress. HEENT: Conjunctivae and lids unremarkable. Cardiovascular: Regular rhythm.  Lungs: Normal work of breathing. Neurologic: No focal deficits.   Lab Results  Component Value  Date   CREATININE 0.91 06/16/2020   BUN 26 (H) 06/16/2020   NA 139 06/16/2020   K 4.7 06/16/2020   CL 103 06/16/2020   CO2 31 06/16/2020   Lab Results  Component Value Date   ALT 21 06/16/2020   AST 19 06/16/2020   ALKPHOS 34 (L) 06/16/2020   BILITOT 0.4 06/16/2020   Lab Results  Component Value Date   HGBA1C 5.2 04/06/2020   HGBA1C 5.6 04/03/2019   HGBA1C 5.6 03/28/2018   Lab Results  Component Value Date   INSULIN 12.6 10/17/2019   Lab Results  Component Value Date   TSH 1.370 10/17/2019   Lab Results  Component Value Date   CHOL 143 04/06/2020   HDL 43 (L) 04/06/2020   LDLCALC 70 04/06/2020   TRIG 210 (H) 04/06/2020   CHOLHDL 3.3 04/06/2020   Lab Results  Component Value Date   WBC 5.0 06/16/2020   HGB 12.5 06/16/2020   HCT 37.1 06/16/2020   MCV 97.4 06/16/2020   PLT 178 06/16/2020   Lab Results  Component Value Date   IRON 120 10/17/2019   TIBC 295 10/17/2019   FERRITIN 206 (H) 12/12/2019   Obesity Behavioral Intervention:   Approximately 15 minutes were spent on the discussion below.  ASK: We discussed the diagnosis of obesity with Laura Bush today and Laura Bush agreed to give [place] permission to discuss obesity behavioral modification therapy today.  ASSESS: Laura Bush has the diagnosis of obesity and her BMI today is 33.8. [name] is in the action stage of change.   ADVISE: [name] was educated on the multiple health risks of obesity as well as the benefit of weight loss to improve her health. She was advised of the need for long term treatment and the importance of lifestyle modifications to improve her current health and to decrease her risk of future health problems.  AGREE: Multiple dietary modification options and treatment options were discussed and [name] agreed to follow the recommendations documented in the above note.  ARRANGE: [name] was educated on the importance of frequent visits to treat obesity as outlined per CMS and USPSTF guidelines and  agreed to schedule her next follow up appointment today.  Attestation Statements:   Reviewed by clinician on day of visit: allergies, medications, problem list, medical history, surgical history, family history, social history, and previous encounter notes.  I, Water quality scientist, CMA, am acting as transcriptionist for Briscoe Deutscher, DO  I have reviewed the above documentation for accuracy and completeness, and I agree with the above. Briscoe Deutscher, DO

## 2020-09-01 ENCOUNTER — Ambulatory Visit: Payer: Medicare HMO | Admitting: Neurology

## 2020-09-01 ENCOUNTER — Encounter: Payer: Self-pay | Admitting: Family Medicine

## 2020-09-01 ENCOUNTER — Ambulatory Visit: Payer: Medicare HMO | Admitting: Family Medicine

## 2020-09-01 VITALS — BP 153/78 | HR 53 | Ht 59.0 in | Wt 176.0 lb

## 2020-09-01 DIAGNOSIS — Z9989 Dependence on other enabling machines and devices: Secondary | ICD-10-CM

## 2020-09-01 DIAGNOSIS — G4733 Obstructive sleep apnea (adult) (pediatric): Secondary | ICD-10-CM

## 2020-09-01 NOTE — Patient Instructions (Addendum)
Please continue using your CPAP regularly. While your insurance requires that you use CPAP at least 4 hours each night on 70% of the nights, I recommend, that you not skip any nights and use it throughout the night if you can. Getting used to CPAP and staying with the treatment long term does take time and patience and discipline. Untreated obstructive sleep apnea when it is moderate to severe can have an adverse impact on cardiovascular health and raise her risk for heart disease, arrhythmias, hypertension, congestive heart failure, stroke and diabetes. Untreated obstructive sleep apnea causes sleep disruption, nonrestorative sleep, and sleep deprivation. This can have an impact on your day to day functioning and cause daytime sleepiness and impairment of cognitive function, memory loss, mood disturbance, and problems focussing. Using CPAP regularly can improve these symptoms.  I have sent orders to [facility] for transfer of care, new CPAP machine and supplies. If you have not heard from them in two weeks please let me know. If you need to reach [facility], there number is [phone] Follow up pending set up of new CPAP (within 31-90 days following set up). Will return to annual follow up, thereafter.   Sleep Apnea Sleep apnea affects breathing during sleep. It causes breathing to stop for a short time or to become shallow. It can also increase the risk of:  Heart attack.  Stroke.  Being very overweight (obese).  Diabetes.  Heart failure.  Irregular heartbeat. The goal of treatment is to help you breathe normally again. What are the causes? There are three kinds of sleep apnea:  Obstructive sleep apnea. This is caused by a blocked or collapsed airway.  Central sleep apnea. This happens when the brain does not send the right signals to the muscles that control breathing.  Mixed sleep apnea. This is a combination of obstructive and central sleep apnea. The most common cause of this  condition is a collapsed or blocked airway. This can happen if:  Your throat muscles are too relaxed.  Your tongue and tonsils are too large.  You are overweight.  Your airway is too small.   What increases the risk?  Being overweight.  Smoking.  Having a small airway.  Being older.  Being female.  Drinking alcohol.  Taking medicines to calm yourself (sedatives or tranquilizers).  Having family members with the condition. What are the signs or symptoms?  Trouble staying asleep.  Being sleepy or tired during the day.  Getting angry a lot.  Loud snoring.  Headaches in the morning.  Not being able to focus your mind (concentrate).  Forgetting things.  Less interest in sex.  Mood swings.  Personality changes.  Feelings of sadness (depression).  Waking up a lot during the night to pee (urinate).  Dry mouth.  Sore throat. How is this diagnosed?  Your medical history.  A physical exam.  A test that is done when you are sleeping (sleep study). The test is most often done in a sleep lab but may also be done at home. How is this treated?  Sleeping on your side.  Using a medicine to get rid of mucus in your nose (decongestant).  Avoiding the use of alcohol, medicines to help you relax, or certain pain medicines (narcotics).  Losing weight, if needed.  Changing your diet.  Not smoking.  Using a machine to open your airway while you sleep, such as: ? An oral appliance. This is a mouthpiece that shifts your lower jaw forward. ? A CPAP device.  This device blows air through a mask when you breathe out (exhale). ? An EPAP device. This has valves that you put in each nostril. ? A BPAP device. This device blows air through a mask when you breathe in (inhale) and breathe out.  Having surgery if other treatments do not work. It is important to get treatment for sleep apnea. Without treatment, it can lead to:  High blood pressure.  Coronary artery  disease.  In men, not being able to have an erection (impotence).  Reduced thinking ability.   Follow these instructions at home: Lifestyle  Make changes that your doctor recommends.  Eat a healthy diet.  Lose weight if needed.  Avoid alcohol, medicines to help you relax, and some pain medicines.  Do not use any products that contain nicotine or tobacco, such as cigarettes, e-cigarettes, and chewing tobacco. If you need help quitting, ask your doctor. General instructions  Take over-the-counter and prescription medicines only as told by your doctor.  If you were given a machine to use while you sleep, use it only as told by your doctor.  If you are having surgery, make sure to tell your doctor you have sleep apnea. You may need to bring your device with you.  Keep all follow-up visits as told by your doctor. This is important. Contact a doctor if:  The machine that you were given to use during sleep bothers you or does not seem to be working.  You do not get better.  You get worse. Get help right away if:  Your chest hurts.  You have trouble breathing in enough air.  You have an uncomfortable feeling in your back, arms, or stomach.  You have trouble talking.  One side of your body feels weak.  A part of your face is hanging down. These symptoms may be an emergency. Do not wait to see if the symptoms will go away. Get medical help right away. Call your local emergency services (911 in the [place] Do not drive yourself to the hospital. Summary  This condition affects breathing during sleep.  The most common cause is a collapsed or blocked airway.  The goal of treatment is to help you breathe normally while you sleep. This information is not intended to replace advice given to you by your health care provider. Make sure you discuss any questions you have with your health care provider. Document Revised: 05/11/2018 Document Reviewed: 03/20/2018 Elsevier Patient  Education  [address].

## 2020-09-01 NOTE — Progress Notes (Addendum)
Community message sent to Janett Billow and Margreta Journey in regards to transferring patient to [facility] and supplies.

## 2020-09-01 NOTE — Progress Notes (Addendum)
PATIENT: Laura Bush DOB: 1950/09/08  REASON FOR VISIT: follow up HISTORY FROM: patient  Chief Complaint  Patient presents with  . Follow-up    RM 1 alone PT is doing good, sleeps great      HISTORY OF PRESENT ILLNESS: Today 09/01/20 Laura Bush is a 70 y.o. female here today for follow up for OSA on CPAP.  She continues to do very well with CPAP therapy.  She is using her machine every night.  She has had some difficulty with the home screen not working appropriately.  She states that it will flash randomly throughout the night which disrupts her sleep.  Her machine is at least 70 years old.  She was currently being managed in [place].  She does not have a local DME.  Compliance report dated 08/02/2020 through 08/31/2020 reveals that she used CPAP 30 of the past 30 days for compliance of 100%.  She CPAP greater than 4 hours all 30 days.  Average usage was 7 hours and 59 minutes.  Residual AHI was 1.0 on 5 to 15 cm of water and an EPR of 3.  There was no significant leak noted.   HISTORY: (copied from Dr Guadelupe Sabin previous note)  Dear Dr. Lorelei Pont, I saw your patient, Laura Bush, upon your kind request in my sleep clinic today for initial consultation of her sleep disorder, in particular, evaluation of her obstructive sleep apnea.  The patient is unaccompanied today.  As you know, [name] is a 70 year old right-handed woman with an underlying medical history of hypertension, hyperlipidemia, heart murmur, history of cluster headaches, reflux disease, breast cancer, status post radiation therapy and surgery, on tamoxifen, asthma, history of rosacea, history of venous insufficiency and pneumonia, and obesity, who reports a prior diagnosis of obstructive sleep apnea.  She has a CPAP machine.  She has not had evaluation in over 5 years.  Her machine is from about 2014 and I was able to review her latest compliance download for the past 30 days from 03/12/2019 through  04/10/2019 during which time she was fully compliant with treatment with an average usage of 8 hours and 3 minutes which is excellent, residual AHI at goal at 1.3/h, average pressure of 12 cm, she is actually on an AutoPap with a setting of 5 to 15 cm, leak on the low side with a 95th percentile at 3.7 L/min.  Prior sleep study results are not available for my review today.  She reports that she had sleep study testing in 2014 through the [facility].  She moved with her husband in December 2015 to [place] to be closer to their son who was expecting twins.  The twins are about 70 years old now.  She has a daughter in [place] who has a 33-year-old and a 68-year-old.  She sleeps well with her CPAP, she is fully compliant with it.  Her husband also has a CPAP machine and they both get their supplies online.  She has been using nasal pillows successfully.  Her Epworth sleepiness score is 4 out of 24, fatigue severity score is 21 out of 63.  She sleeps without major interruptions, no nocturia or morning headaches are reported.  She generally is in bed around 10 and rise time is between 6 and 7.  She is retired from an [employer] based out of [employer].  Her husband still works.  She had a tonsillectomy at age 41.  Her weight has increased over  time.  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  She had subsequent radiation therapy and is in her fifth year of tamoxifen.  She is a non-smoker and drinks a glass of wine essentially daily, caffeine and limitation in the form of coffee, 1 cup/day on average.  They have no pets in the household.      REVIEW OF SYSTEMS: Out of a complete 14 system review of symptoms, the patient complains only of the following symptoms, none and all other reviewed systems are negative.  ESS: 3  ALLERGIES: Allergies  Allergen  Reactions  . Amlodipine Swelling  . Lanolin   . Tape Other (See Comments)    Redness (Bandaids also)    HOME MEDICATIONS: Outpatient Medications Prior to Visit  Medication Sig Dispense Refill  . aMILoride (MIDAMOR) 5 MG tablet Take 1 tablet (5 mg total) by mouth daily. 90 tablet 3  . B Complex-C (B-COMPLEX WITH VITAMIN C) tablet Take 1 tablet by mouth daily.    Marland Kitchen CALCIUM CARBONATE-VITAMIN D PO Take 1 tablet by mouth 2 (two) times daily. 600mg  calcium, D3 (1000)IU    . carvedilol (COREG) 6.25 MG tablet Take 6.25 mg by mouth 2 (two) times daily with a meal.    . Elderberry 575 MG/5ML SYRP Take by mouth daily.    Marland Kitchen Fexofenadine HCl (ALLEGRA PO) Take by mouth.    . hydrochlorothiazide (MICROZIDE) 12.5 MG capsule Take 1 capsule (12.5 mg total) by mouth daily. 90 capsule 3  . metFORMIN (GLUCOPHAGE) 500 MG tablet Take 1 tablet (500 mg total) by mouth 2 (two) times daily. 180 tablet 0  . pantoprazole (PROTONIX) 40 MG tablet Take 1 tablet (40 mg total) by mouth daily. 90 tablet 3  . simvastatin (ZOCOR) 20 MG tablet Take 1 tablet (20 mg total) by mouth daily. 90 tablet 3  . telmisartan (MICARDIS) 80 MG tablet Take 0.5 tablets (40 mg total) by mouth daily. 90 tablet 3   No facility-administered medications prior to visit.    PAST MEDICAL HISTORY: Past Medical History:  Diagnosis Date  . Asthma    in cold weather  . Breast cancer ([facility]) 09/18/14   left breast  . Breast cancer ([facility]) 10/07/14   bx left breast  . Breast cancer of upper-outer quadrant of left female breast ([facility]) 09/22/2014  . Cluster headaches    in her 40's  . GERD (gastroesophageal reflux disease)   . Heart murmur    as a child (has outgrown)  . High cholesterol   . Hypertension   . Lower extremity edema   . Microscopic colitis   . Morbid obesity ([facility]) 09/30/2019  . OSA on CPAP   . Personal history of radiation therapy   . Pneumonia 2000's X 1  . PONV (postoperative nausea and vomiting)   . Rosacea   . S/P radiation  therapy 12/30/14-01/27/15   left breast 50Gy total dose  . 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 Swallowing difficulty   . Venous insufficiency     PAST SURGICAL HISTORY: Past Surgical History:  Procedure Laterality Date  . ABDOMINAL HYSTERECTOMY  1999  . APPENDECTOMY  ~ 2006  . BREAST BIOPSY Left 10/2014  . BREAST LUMPECTOMY Left 2016  . BREAST REDUCTION SURGERY Bilateral 11/11/2014   Procedure: Bilateral Breast Reduction;  Surgeon: Crissie Reese, MD;  Location: [facility];  Service: Plastics;  Laterality:  Bilateral;  . CARPAL TUNNEL RELEASE Bilateral    2 surgeries on right, 1 on left  . CESAREAN SECTION  1978; 1980  . COLONOSCOPY    . FOREARM FRACTURE SURGERY Right 2003 X 3  . KNEE ARTHROSCOPY Bilateral    meniscus repair  . PARTIAL MASTECTOMY WITH NEEDLE LOCALIZATION AND AXILLARY SENTINEL LYMPH NODE BX Left 11/11/2014   Procedure: LEFT BREAST PARTIAL MASTECTOMY WITH NEEDLE LOCALIZATION TIMES TWO AND LEFT AXILLARY SENTINEL LYMPH NODE Biopsy;  Surgeon: Fanny Skates, MD;  Location: [facility];  Service: General;  Laterality: Left;  . SQUAMOUS CELL CARCINOMA EXCISION  1980's X 1   "nose"  . TONSILLECTOMY  ~ 1957  . TUBAL LIGATION  ?1984    FAMILY HISTORY: Family History  Problem Relation Age of Onset  . Uterine cancer Sister 33  . Stroke Father        deceased  . Heart failure Father   . Hypertension Father   . Dementia Mother        Lives in [place]  . Hypertension Mother   . Hyperlipidemia Mother   . Heart attack Maternal Grandmother   . Hypertension Maternal Grandfather   . Stroke Paternal Grandfather   . Colon cancer Neg Hx   . Esophageal cancer Neg Hx   . Pancreatic cancer Neg Hx   . Stomach cancer Neg Hx   . Liver disease Neg Hx     SOCIAL HISTORY: Social History   Socioeconomic History  . Marital status: Married    Spouse name: Randy Bush  . Number of children: Not on file  . Years of education: Not on file  . Highest education level: Not on file   Occupational History  . Occupation: retired  Tobacco Use  . Smoking status: Never Smoker  . Smokeless tobacco: Never Used  Substance and Sexual Activity  . Alcohol use: Yes    Alcohol/week: 5.0 standard drinks    Types: 5 Glasses of wine per week  . Drug use: No  . Sexual activity: Not Currently  Other Topics Concern  . Not on file  Social History Narrative   From [place] [employer].   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   2 children, boy and girl   Twin grandchildren on the way.   Quilting, sewing, reading.   Social Determinants of Health   Financial Resource Strain: Low Risk   . Difficulty of Paying Living Expenses: Not very hard  Food Insecurity: Not on file  Transportation Needs: Not on file  Physical Activity: Sufficiently Active  . Days of Exercise per Week: 7 days  . Minutes of Exercise per Session: 60 min  Stress: Not on file  Social Connections: Not on file  Intimate Partner Violence: Not on file     PHYSICAL EXAM  Vitals:   09/01/20 1255  BP: (!) 153/78  Pulse: (!) 53  Weight: 176 lb (79.8 kg)  Height: 4\' 11"  (1.499 m)   Body mass index is 35.55 kg/m.  Generalized: Well developed, in no acute distress  Cardiology: normal rate and rhythm, no murmur noted Respiratory: clear to auscultation bilaterally  Neurological examination  Mentation: Alert oriented to time, place, history taking. Follows all commands speech and language fluent Cranial nerve II-XII: Pupils were equal round reactive to light. Extraocular movements were full, visual field were full  Motor: The motor testing reveals 5 over 5 strength of all 4 extremities. Good symmetric motor tone is noted throughout.  Gait and  station: Gait is normal.    DIAGNOSTIC DATA (LABS, IMAGING, TESTING) - I reviewed patient records, labs, notes, testing and imaging myself where available.  No flowsheet data found.   Lab Results  Component Value Date   WBC 5.0  06/16/2020   HGB 12.5 06/16/2020   HCT 37.1 06/16/2020   MCV 97.4 06/16/2020   PLT 178 06/16/2020      Component Value Date/Time   NA 139 06/16/2020 1256   NA 142 05/29/2017 1002   K 4.7 06/16/2020 1256   K 3.9 05/29/2017 1002   CL 103 06/16/2020 1256   CO2 31 06/16/2020 1256   CO2 28 05/29/2017 1002   GLUCOSE 123 (H) 06/16/2020 1256   GLUCOSE 86 05/29/2017 1002   BUN 26 (H) 06/16/2020 1256   BUN 13.5 05/29/2017 1002   CREATININE 0.91 06/16/2020 1256   CREATININE 0.81 04/06/2020 1059   CREATININE 0.8 05/29/2017 1002   CALCIUM 9.5 06/16/2020 1256   CALCIUM 9.4 05/29/2017 1002   PROT 6.8 06/16/2020 1256   PROT 6.8 05/29/2017 1002   ALBUMIN 3.9 06/16/2020 1256   ALBUMIN 3.7 05/29/2017 1002   AST 19 06/16/2020 1256   AST 18 05/29/2017 1002   ALT 21 06/16/2020 1256   ALT 19 05/29/2017 1002   ALKPHOS 34 (L) 06/16/2020 1256   ALKPHOS 39 (L) 05/29/2017 1002   BILITOT 0.4 06/16/2020 1256   BILITOT 0.48 05/29/2017 1002   GFRNONAA >60 06/16/2020 1256   GFRAA >60 10/03/2019 1311   Lab Results  Component Value Date   CHOL 143 04/06/2020   HDL 43 (L) 04/06/2020   LDLCALC 70 04/06/2020   TRIG 210 (H) 04/06/2020   CHOLHDL 3.3 04/06/2020   Lab Results  Component Value Date   HGBA1C 5.2 04/06/2020   Lab Results  Component Value Date   VITAMINB12 445 10/17/2019   Lab Results  Component Value Date   TSH 1.370 10/17/2019     ASSESSMENT AND PLAN 70 y.o. year old female  has a past medical history of Asthma, Breast cancer ([facility]) (09/18/14), Breast cancer ([facility]) (10/07/14), Breast cancer of upper-outer quadrant of left female breast ([facility]) (09/22/2014), Cluster headaches, GERD (gastroesophageal reflux disease), Heart murmur, High cholesterol, Hypertension, Lower extremity edema, Microscopic colitis, Morbid obesity ([facility]) (09/30/2019), OSA on CPAP, Personal history of radiation therapy, Pneumonia (2000's X 1), PONV (postoperative nausea and vomiting), Rosacea, S/P radiation therapy  (12/30/14-01/27/15), Squamous 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 here with     ICD-10-CM   1. OSA on CPAP  G47.33 For home use only DME continuous positive airway pressure (CPAP)   Z99.89 For home use only DME continuous positive airway pressure (CPAP)    For home use only DME continuous positive airway pressure (CPAP)    CANCELED: For home use only DME continuous positive airway pressure (CPAP)     Laura Bush is doing well on CPAP therapy. Compliance report reveals excellent compliance.  She has had some difficulty with the home screen of her CPAP machine not operating correctly.  We also had difficulty receiving data report from [facility].  I am concerned of the age of her machine may contribute to these concerns.  I will send orders today for [facility] of care to [facility].  I will also send an order for a new CPAP machine with current settings of 5 to 15 cm of water and EPR of 3. She was encouraged to continue using CPAP nightly and for greater than 4 hours each night.  We will update supply orders as indicated. Risks of untreated sleep apnea review and education materials provided. Healthy lifestyle habits encouraged. She will follow up pending set up date.  We will have her follow-up annually, thereafter. She verbalizes understanding and agreement with this plan.   Orders Placed This Encounter  Procedures  . For home use only DME continuous positive airway pressure (CPAP)    Supplies    Order Specific Question:   Length of Need    Answer:   Lifetime    Order Specific Question:   Patient has OSA or probable OSA    Answer:   Yes    Order Specific Question:   Is the patient currently using CPAP in the home    Answer:   Yes    Order Specific Question:   Settings    Answer:   Other see comments    Order Specific Question:   CPAP supplies needed    Answer:   Mask, headgear, cushions, filters, heated tubing and water chamber  . For home use only DME  continuous positive airway pressure (CPAP)    Transfer of care to [facility]. Does not have local DME    Order Specific Question:   Length of Need    Answer:   Lifetime    Order Specific Question:   Patient has OSA or probable OSA    Answer:   Yes    Order Specific Question:   Is the patient currently using CPAP in the home    Answer:   Yes    Order Specific Question:   Settings    Answer:   Other see comments    Order Specific Question:   CPAP supplies needed    Answer:   Mask, headgear, cushions, filters, heated tubing and water chamber  . For home use only DME continuous positive airway pressure (CPAP)    Needs new CPAP machine, current machine > 64 years old, not loading data to [facility] and patient reports home screen does not operate normally. Settings: Min pressure 5cmH20, Max pressure 15cmH20 and EPR 3    Order Specific Question:   Length of Need    Answer:   Lifetime    Order Specific Question:   Patient has OSA or probable OSA    Answer:   Yes    Order Specific Question:   Is the patient currently using CPAP in the home    Answer:   Yes    Order Specific Question:   Settings    Answer:   Other see comments    Order Specific Question:   CPAP supplies needed    Answer:   Mask, headgear, cushions, filters, heated tubing and water chamber     No orders of the defined types were placed in this encounter.     I spent 15 minutes with the patient. 50% of this time was spent counseling and educating patient on plan of care and medications.    Debbora Presto, FNP-C 09/01/2020, 1:33 PM [facility] [address], [place], [place] [address] [phone]  I reviewed the above note and documentation by the Nurse Practitioner and agree with the history, exam, assessment and plan as outlined above. I was available for consultation. Star Age, MD, PhD [facility])

## 2020-09-01 NOTE — Progress Notes (Signed)
Message received: "Thank you, will process.   Janett Billow "

## 2020-09-05 ENCOUNTER — Other Ambulatory Visit (INDEPENDENT_AMBULATORY_CARE_PROVIDER_SITE_OTHER): Payer: Self-pay | Admitting: Family Medicine

## 2020-09-05 DIAGNOSIS — E8881 Metabolic syndrome: Secondary | ICD-10-CM

## 2020-09-07 NOTE — Progress Notes (Signed)
Message received: "We will need a copy of pt's sleep study as well as notes prior to that sleep study indicating the possibility of sleep apnea in order to service this pt. Let me know if you have any questions.   Thank you,   Janett Billow"    I informed her that sleep study and notes have been sent.

## 2020-09-13 ENCOUNTER — Encounter: Payer: Self-pay | Admitting: Family Medicine

## 2020-09-14 ENCOUNTER — Other Ambulatory Visit: Payer: Self-pay

## 2020-09-14 ENCOUNTER — Ambulatory Visit (INDEPENDENT_AMBULATORY_CARE_PROVIDER_SITE_OTHER): Payer: Medicare HMO | Admitting: Family Medicine

## 2020-09-14 ENCOUNTER — Encounter (INDEPENDENT_AMBULATORY_CARE_PROVIDER_SITE_OTHER): Payer: Self-pay | Admitting: Family Medicine

## 2020-09-14 VITALS — BP 145/79 | HR 55 | Temp 98.0°F | Ht 59.0 in | Wt 171.0 lb

## 2020-09-14 DIAGNOSIS — E7849 Other hyperlipidemia: Secondary | ICD-10-CM

## 2020-09-14 DIAGNOSIS — E65 Localized adiposity: Secondary | ICD-10-CM | POA: Diagnosis not present

## 2020-09-14 DIAGNOSIS — Z6834 Body mass index (BMI) 34.0-34.9, adult: Secondary | ICD-10-CM

## 2020-09-14 DIAGNOSIS — E8881 Metabolic syndrome: Secondary | ICD-10-CM | POA: Diagnosis not present

## 2020-09-14 DIAGNOSIS — I10 Essential (primary) hypertension: Secondary | ICD-10-CM

## 2020-09-14 DIAGNOSIS — M25511 Pain in right shoulder: Secondary | ICD-10-CM | POA: Diagnosis not present

## 2020-09-14 DIAGNOSIS — M25512 Pain in left shoulder: Secondary | ICD-10-CM | POA: Diagnosis not present

## 2020-09-14 DIAGNOSIS — E669 Obesity, unspecified: Secondary | ICD-10-CM | POA: Diagnosis not present

## 2020-09-14 MED ORDER — METFORMIN HCL 500 MG PO TABS: 500.0000 mg | ORAL_TABLET | Freq: Two times a day (BID) | ORAL | 0 refills | Status: DC

## 2020-09-15 ENCOUNTER — Ambulatory Visit
Admission: RE | Admit: 2020-09-15 | Discharge: 2020-09-15 | Disposition: A | Discharge status: Home / Self Care | Payer: Medicare HMO | Source: Ambulatory Visit | Attending: Oncology | Admitting: Oncology

## 2020-09-15 DIAGNOSIS — Z1231 Encounter for screening mammogram for malignant neoplasm of breast: Secondary | ICD-10-CM | POA: Diagnosis not present

## 2020-09-16 NOTE — Progress Notes (Signed)
Subjective:    CC: B shoulder pain, L>R  I, Laura Bush, LAT, ATC, am serving as scribe for Dr. Lynne Bush.  HPI: Pt is a 70 y/o female presenting w/ c/o B shoulder, L>R,  pain since late Oct/early Nov 2021 w/ no known MOI.  She locates her pain to her L post shoulder/scapular spine and the R lateral shoulder.  She has been doing a lot of sewing as part of [place] recently but thinks that may be partially to blame.  She denies any injury.  No radiating pain weakness or numbness.  Radiating pain: No R shoulder mechanical symptoms: No Aggravating factors: Nothing Treatments tried: oral Voltaren; Naproxen; heat  Pertinent review of Systems: No fevers or chills.  Relevant historical information: History left breast cancer   Objective:    Vitals:   09/17/20 1000  BP: (!) 152/96  Pulse: 67  SpO2: 97%   General: Well Developed, well nourished, and in no acute distress.   MSK: C-spine normal-appearing Nontender midline. Palpation left trapezius. Normal cervical motion. Negative Spurling's test Upper extremity strength reflexes and sensation are intact distally bilaterally.  Left shoulder normal-appearing Normal motion. Tender palpation along superior scapular spine. Normal strength. Negative Hawkins and Neer's test. Mild pain with crossover arm compression test. Negative empty can test.  Right shoulder normal-appearing Normal motion. Mildly tender palpation posterior shoulder at scapula spine. Negative Hawkins and Neer's test.  Negative empty can test. Mild pain with crossarm compression test.  Pulses capillary refill and sensation are intact distally.  Lab and Radiology Results  X-ray images C-spine, and bilateral shoulders obtained today personally and independently interpreted  C-spine: DDD most pronounced at C6-7.  No fractures no malalignment.  No lytic lesions.  Left shoulder: AC DJD.  Minimal glenohumeral DJD.  No fractures or  malalignment.  Right shoulder: Moderate AC DJD.  Minimal glenohumeral DJD.  No fractures or malalignment.  Await formal radiology review    Impression and Recommendations:    Assessment and Plan: 70 y.o. female with bilateral posterior shoulder pain left worse than right.  Pain mostly focused around the scapula and trapezius insertion sites.  Doubtful for true shoulder etiology.  Plan for physical therapy as main treatment modality.  Discussed medications we will hold off on muscle relaxers at this time as symptoms do not recur much at bedtime.  Recheck back in about 6 weeks.  Return sooner if needed.Marland Kitchen  PDMP not reviewed this encounter. Orders Placed This Encounter  Procedures  . DG Cervical Spine 2 or 3 views    Standing Status:   Future    Number of Occurrences:   1    Standing Expiration Date:   09/17/2021    Order Specific Question:   Reason for Exam (SYMPTOM  OR DIAGNOSIS REQUIRED)    Answer:   eval neck pain    Order Specific Question:   Preferred imaging location?    Answer:   [facility]  . DG Shoulder Left    Standing Status:   Future    Number of Occurrences:   1    Standing Expiration Date:   09/17/2021    Order Specific Question:   Reason for Exam (SYMPTOM  OR DIAGNOSIS REQUIRED)    Answer:   eval shoulder pain    Order Specific Question:   Preferred imaging location?    Answer:   [facility]  . DG Shoulder Right    Standing Status:   Future    Number  of Occurrences:   1    Standing Expiration Date:   09/17/2021    Order Specific Question:   Reason for Exam (SYMPTOM  OR DIAGNOSIS REQUIRED)    Answer:   eval shoulder pain    Order Specific Question:   Preferred imaging location?    Answer:   [facility]  . Ambulatory referral to Physical Therapy    Referral Priority:   Routine    Referral Type:   Physical Medicine    Referral Reason:   Specialty Services Required    Requested Specialty:   Physical Therapy   No orders of the defined  types were placed in this encounter.   Discussed warning signs or symptoms. Please see discharge instructions. Patient expresses understanding.   The above documentation has been reviewed and is accurate and complete Laura Bush, M.D.

## 2020-09-16 NOTE — Progress Notes (Signed)
Chief Complaint:   OBESITY Laura Bush is here to discuss her progress with her obesity treatment plan along with follow-up of her obesity related diagnoses.   Today's visit was #: 13 Starting weight: 196 lbs Starting date: 10/17/2019 Today's weight: 171 lbs Today's date: 09/14/2020 Total lbs lost to date: 25 lbs Body mass index is 34.54 kg/m.  Total weight loss percentage to date: -12.76%  Interim History: Laura Bush has been alternating Advil and Aleve for her shoulder pain.  She says she has not been walking because it has been too cold.  She will be seeing Dr. Lorelei Pont on 10/05/2020. Nutrition Plan: [place] with Chicken for 90-95% of the time. Activity: Yoga for 60 minutes 3 times per week.  Assessment/Plan:   1. Essential hypertension Elevated today.  Medications: Coreg 6.25 mg twice daily, telmisartan 40 mg daily, amiloride 5 mg daily.  Plan:  Increase telmisartan back to 80 mg daily, at least while on NSAIDS.  Avoid buying foods that are: processed, frozen, or prepackaged to avoid excess salt. We will continue to monitor closely alongside her PCP and/or Specialist.  Regular follow up with PCP and specialists was also encouraged.   BP Readings from Last 3 Encounters:  09/14/20 (!) 145/79  09/01/20 (!) 153/78  08/17/20 124/78   Lab Results  Component Value Date   CREATININE 0.91 06/16/2020   2. Bilateral shoulder pain, unspecified chronicity She is taking NSAIDs, which are possibly causing elevated blood pressure.  Will place referral to Sports Medicine today and send note to her PCP, Dr. Lorelei Pont.  - Ambulatory referral to Sports Medicine  3. Other hyperlipidemia Course: Not at goal.  Lipid-lowering medications: Zocor 20 mg daily.   Plan: Dietary changes: Increase soluble fiber, decrease simple carbohydrates, decrease saturated fat. Exercise changes: Moderate to vigorous-intensity aerobic activity 150 minutes per week or as tolerated. We will continue to monitor  along with PCP/specialists as it pertains to her weight loss journey.  Lab Results  Component Value Date   CHOL 143 04/06/2020   HDL 43 (L) 04/06/2020   LDLCALC 70 04/06/2020   TRIG 210 (H) 04/06/2020   CHOLHDL 3.3 04/06/2020   Lab Results  Component Value Date   ALT 21 06/16/2020   AST 19 06/16/2020   ALKPHOS 34 (L) 06/16/2020   BILITOT 0.4 06/16/2020   The 10-year ASCVD risk score Mikey Bussing DC Jr., et al., 2013) is: 24.6%   Values used to calculate the score:     Age: 70 years     Sex: Female     Is Non-Hispanic African American: No     Diabetic: Yes     Tobacco smoker: No     Systolic Blood Pressure: 027 mmHg     Is BP treated: Yes     HDL Cholesterol: 43 mg/dL     Total Cholesterol: 143 mg/dL  4. Insulin resistance Improving, but not optimized. Goal is HgbA1c < 5.7, fasting insulin closer to 5.  Medication: metformin 500 mg twice daily.   Plan:  Continue metformin, will refill today.  She will continue to focus on protein-rich, low simple carbohydrate foods. We reviewed the importance of hydration, regular exercise for stress reduction, and restorative sleep.   Lab Results  Component Value Date   HGBA1C 5.2 04/06/2020   Lab Results  Component Value Date   INSULIN 12.6 10/17/2019   - Refill metFORMIN (GLUCOPHAGE) 500 MG tablet; Take 1 tablet (500 mg total) by mouth 2 (two) times daily.  Dispense: 180  tablet; Refill: 0  5. Visceral obesity/metabolic syndrome Current visceral fat rating: 15. Visceral fat rating should be < 13. Visceral adipose tissue is a hormonally active component of total body fat. This body composition phenotype is associated with medical disorders such as metabolic syndrome, cardiovascular disease and several malignancies including prostate, breast, and colorectal cancers. Starting goal: Lose 7-10% of starting weight.   She will continue to focus on protein-rich, low simple carbohydrate foods. We reviewed the importance of hydration, regular exercise  for stress reduction, and restorative sleep.  We will continue to check lab work every 3 months, with 10% weight loss, or should any other concerns arise.  6. Class 1 obesity with serious comorbidity and body mass index (BMI) of 34.0 to 34.9 in adult, unspecified obesity type  Course: Laura Bush is currently in the action stage of change. As such, her goal is to continue with weight loss efforts.   Nutrition goals: She has agreed to the Laura Bush.   Exercise goals: As is.  Behavioral modification strategies: increasing lean protein intake, decreasing simple carbohydrates, increasing vegetables, increasing water intake, decreasing liquid calories and decreasing alcohol intake.  Laura Bush has agreed to follow-up with our clinic in 4 weeks. She was informed of the importance of frequent follow-up visits to maximize her success with intensive lifestyle modifications for her multiple health conditions.   Objective:   Blood pressure (!) 145/79, pulse (!) 55, temperature 98 F (36.7 C), temperature source Oral, height 4\' 11"  (1.499 m), weight 171 lb (77.6 kg), SpO2 96 %. Body mass index is 34.54 kg/m.  General: Cooperative, alert, well developed, in no acute distress. HEENT: Conjunctivae and lids unremarkable. Cardiovascular: Regular rhythm.  Lungs: Normal work of breathing. Neurologic: No focal deficits.   Lab Results  Component Value Date   CREATININE 0.91 06/16/2020   BUN 26 (H) 06/16/2020   NA 139 06/16/2020   K 4.7 06/16/2020   CL 103 06/16/2020   CO2 31 06/16/2020   Lab Results  Component Value Date   ALT 21 06/16/2020   AST 19 06/16/2020   ALKPHOS 34 (L) 06/16/2020   BILITOT 0.4 06/16/2020   Lab Results  Component Value Date   HGBA1C 5.2 04/06/2020   HGBA1C 5.6 04/03/2019   HGBA1C 5.6 03/28/2018   Lab Results  Component Value Date   INSULIN 12.6 10/17/2019   Lab Results  Component Value Date   TSH 1.370 10/17/2019   Lab Results  Component Value Date   CHOL  143 04/06/2020   HDL 43 (L) 04/06/2020   LDLCALC 70 04/06/2020   TRIG 210 (H) 04/06/2020   CHOLHDL 3.3 04/06/2020   Lab Results  Component Value Date   WBC 5.0 06/16/2020   HGB 12.5 06/16/2020   HCT 37.1 06/16/2020   MCV 97.4 06/16/2020   PLT 178 06/16/2020   Lab Results  Component Value Date   IRON 120 10/17/2019   TIBC 295 10/17/2019   FERRITIN 206 (H) 12/12/2019   Obesity Behavioral Intervention:   Approximately 15 minutes were spent on the discussion below.  ASK: We discussed the diagnosis of obesity with Laura Bush today and Laura Bush agreed to give [place] permission to discuss obesity behavioral modification therapy today.  ASSESS: Nataleah has the diagnosis of obesity and her BMI today is 34.5. Beverly is in the action stage of change.   ADVISE: Laura Bush was educated on the multiple health risks of obesity as well as the benefit of weight loss to improve her health. She was advised of  the need for long term treatment and the importance of lifestyle modifications to improve her current health and to decrease her risk of future health problems.  AGREE: Multiple dietary modification options and treatment options were discussed and Laura Bush agreed to follow the recommendations documented in the above note.  ARRANGE: Laura Bush was educated on the importance of frequent visits to treat obesity as outlined per CMS and USPSTF guidelines and agreed to schedule her next follow up appointment today.  Attestation Statements:   Reviewed by clinician on day of visit: allergies, medications, problem list, medical history, surgical history, family history, social history, and previous encounter notes.  I, Water quality scientist, CMA, am acting as transcriptionist for Briscoe Deutscher, DO  I have reviewed the above documentation for accuracy and completeness, and I agree with the above. Briscoe Deutscher, DO

## 2020-09-17 ENCOUNTER — Other Ambulatory Visit: Payer: Self-pay

## 2020-09-17 ENCOUNTER — Encounter: Payer: Self-pay | Admitting: Family Medicine

## 2020-09-17 ENCOUNTER — Ambulatory Visit: Payer: Medicare HMO | Admitting: Family Medicine

## 2020-09-17 ENCOUNTER — Ambulatory Visit: Payer: Self-pay

## 2020-09-17 ENCOUNTER — Ambulatory Visit (INDEPENDENT_AMBULATORY_CARE_PROVIDER_SITE_OTHER): Payer: Medicare HMO

## 2020-09-17 VITALS — BP 152/96 | HR 67 | Ht 59.0 in | Wt 175.8 lb

## 2020-09-17 DIAGNOSIS — M25511 Pain in right shoulder: Secondary | ICD-10-CM | POA: Diagnosis not present

## 2020-09-17 DIAGNOSIS — G8929 Other chronic pain: Secondary | ICD-10-CM

## 2020-09-17 DIAGNOSIS — M19012 Primary osteoarthritis, left shoulder: Secondary | ICD-10-CM | POA: Diagnosis not present

## 2020-09-17 DIAGNOSIS — M19011 Primary osteoarthritis, right shoulder: Secondary | ICD-10-CM | POA: Diagnosis not present

## 2020-09-17 DIAGNOSIS — Z170 Estrogen receptor positive status [ER+]: Secondary | ICD-10-CM | POA: Diagnosis not present

## 2020-09-17 DIAGNOSIS — C50812 Malignant neoplasm of overlapping sites of left female breast: Secondary | ICD-10-CM

## 2020-09-17 DIAGNOSIS — M25512 Pain in left shoulder: Secondary | ICD-10-CM

## 2020-09-17 DIAGNOSIS — M47812 Spondylosis without myelopathy or radiculopathy, cervical region: Secondary | ICD-10-CM | POA: Diagnosis not present

## 2020-09-17 NOTE — Patient Instructions (Signed)
Thank you for coming in today.  Please get an Xray today before you leave  I've referred you to Physical Therapy.  Let us know if you don't hear from them in one week.  Use heating pad as needed.   Please use voltaren gel up to 4x daily for pain as needed.    Recheck with me in about 6 weeks.   Let me know sooner if this is not working or if you have problems.

## 2020-09-18 ENCOUNTER — Encounter: Payer: Self-pay | Admitting: Family Medicine

## 2020-09-18 DIAGNOSIS — I10 Essential (primary) hypertension: Secondary | ICD-10-CM

## 2020-09-18 MED ORDER — TELMISARTAN 80 MG PO TABS: 80.0000 mg | ORAL_TABLET | Freq: Every day | ORAL | 3 refills | Status: DC

## 2020-09-18 NOTE — Progress Notes (Signed)
X-ray cervical spine shows some arthritis at C5-6 and C6-7

## 2020-09-18 NOTE — Progress Notes (Signed)
X-ray left shoulder shows mild arthritis but no fractures or acute findings.  No tumors.  Lung fields visible are normal-appearing

## 2020-09-18 NOTE — Progress Notes (Signed)
X-ray right shoulder shows some arthritis but otherwise looks normal to radiology

## 2020-09-23 ENCOUNTER — Telehealth: Payer: Self-pay | Admitting: Neurology

## 2020-09-23 ENCOUNTER — Telehealth: Payer: Self-pay

## 2020-09-23 ENCOUNTER — Encounter: Payer: Self-pay | Admitting: Family Medicine

## 2020-09-23 DIAGNOSIS — G4733 Obstructive sleep apnea (adult) (pediatric): Secondary | ICD-10-CM

## 2020-09-23 NOTE — Telephone Encounter (Signed)
I reached out the patient to see if she has prior sleep study due to her trying to get transferred to a local DME company here, and they require a sleep study results.  She states she will look through her records to try to locate these results, I did inform her, if not, we will need to retest to get her a local DME supplier and CPAP. She will reach back out to [place] when she can.

## 2020-09-23 NOTE — Telephone Encounter (Signed)
Please notify patient that I have ordered a sleep study for reevaluation of her sleep apnea and in order to qualify for new equipment.

## 2020-09-24 ENCOUNTER — Other Ambulatory Visit: Payer: Self-pay

## 2020-09-24 ENCOUNTER — Ambulatory Visit (INDEPENDENT_AMBULATORY_CARE_PROVIDER_SITE_OTHER): Payer: Medicare HMO

## 2020-09-24 VITALS — BP 132/86 | HR 65 | Temp 98.3°F | Resp 16 | Ht 59.0 in | Wt 174.0 lb

## 2020-09-24 DIAGNOSIS — Z780 Asymptomatic menopausal state: Secondary | ICD-10-CM | POA: Diagnosis not present

## 2020-09-24 DIAGNOSIS — Z0000 Encounter for general adult medical examination without abnormal findings: Secondary | ICD-10-CM | POA: Diagnosis not present

## 2020-09-24 NOTE — Patient Instructions (Signed)
Laura Bush , Thank you for taking time to come for your Medicare Wellness Visit. I appreciate your ongoing commitment to your health goals. Please review the following plan we discussed and let me know if I can assist you in the future.   Screening recommendations/referrals: Colonoscopy: Completed 04/09/2020-Due-04/09/2025 Mammogram: Completed 09/15/2020-Due 09/15/2021 Bone Density: Ordered today. Someone will be calling you to schedule. Recommended yearly ophthalmology/optometry visit for glaucoma screening and checkup Recommended yearly dental visit for hygiene and checkup  Vaccinations: Influenza vaccine: Up to date Pneumococcal vaccine: Completed vaccines Tdap vaccine: Up to date-Due-09/18/2021 Shingles vaccine: Completed vaccines  Covid-19:Completed vaccines  Advanced directives: Copy in chart  Conditions/risks identified: See problem list  Next appointment: Follow up in one year for your annual wellness visit 09/30/2021 @ 2:40.   Preventive Care 30 Years and Older, Female Preventive care refers to lifestyle choices and visits with your health care provider that can promote health and wellness. What does preventive care include?  A yearly physical exam. This is also called an annual well check.  Dental exams once or twice a year.  Routine eye exams. Ask your health care provider how often you should have your eyes checked.  Personal lifestyle choices, including:  Daily care of your teeth and gums.  Regular physical activity.  Eating a healthy diet.  Avoiding tobacco and drug use.  Limiting alcohol use.  Practicing safe sex.  Taking low-dose aspirin every day.  Taking vitamin and mineral supplements as recommended by your health care provider. What happens during an annual well check? The services and screenings done by your health care provider during your annual well check will depend on your age, overall health, lifestyle risk factors, and family history of  disease. Counseling  Your health care provider may ask you questions about your:  Alcohol use.  Tobacco use.  Drug use.  Emotional well-being.  Home and relationship well-being.  Sexual activity.  Eating habits.  History of falls.  Memory and ability to understand (cognition).  Work and work Statistician.  Reproductive health. Screening  You may have the following tests or measurements:  Height, weight, and BMI.  Blood pressure.  Lipid and cholesterol levels. These may be checked every 5 years, or more frequently if you are over 2 years old.  Skin check.  Lung cancer screening. You may have this screening every year starting at age 62 if you have a 30-pack-year history of smoking and currently smoke or have quit within the past 15 years.  Fecal occult blood test (FOBT) of the stool. You may have this test every year starting at age 39.  Flexible sigmoidoscopy or colonoscopy. You may have a sigmoidoscopy every 5 years or a colonoscopy every 10 years starting at age 56.  Hepatitis C blood test.  Hepatitis B blood test.  Sexually transmitted disease (STD) testing.  Diabetes screening. This is done by checking your blood sugar (glucose) after you have not eaten for a while (fasting). You may have this done every 1-3 years.  Bone density scan. This is done to screen for osteoporosis. You may have this done starting at age 90.  Mammogram. This may be done every 1-2 years. Talk to your health care provider about how often you should have regular mammograms. Talk with your health care provider about your test results, treatment options, and if necessary, the need for more tests. Vaccines  Your health care provider may recommend certain vaccines, such as:  Influenza vaccine. This is recommended every year.  Tetanus, diphtheria, and acellular pertussis (Tdap, Td) vaccine. You may need a Td booster every 10 years.  Zoster vaccine. You may need this after age  9.  Pneumococcal 13-valent conjugate (PCV13) vaccine. One dose is recommended after age 38.  Pneumococcal polysaccharide (PPSV23) vaccine. One dose is recommended after age 58. Talk to your health care provider about which screenings and vaccines you need and how often you need them. This information is not intended to replace advice given to you by your health care provider. Make sure you discuss any questions you have with your health care provider. Document Released: 08/21/2015 Document Revised: 04/13/2016 Document Reviewed: 05/26/2015 Elsevier Interactive Patient Education  2017 [place] Prevention in the Home Falls can cause injuries. They can happen to people of all ages. There are many things you can do to make your home safe and to help prevent falls. What can I do on the outside of my home?  Regularly fix the edges of walkways and driveways and fix any cracks.  Remove anything that might make you trip as you walk through a door, such as a raised step or threshold.  Trim any bushes or trees on the path to your home.  Use bright outdoor lighting.  Clear any walking paths of anything that might make someone trip, such as rocks or tools.  Regularly check to see if handrails are loose or broken. Make sure that both sides of any steps have handrails.  Any raised decks and porches should have guardrails on the edges.  Have any leaves, snow, or ice cleared regularly.  Use sand or salt on walking paths during winter.  Clean up any spills in your garage right away. This includes oil or grease spills. What can I do in the bathroom?  Use night lights.  Install grab bars by the toilet and in the tub and shower. Do not use towel bars as grab bars.  Use non-skid mats or decals in the tub or shower.  If you need to sit down in the shower, use a plastic, non-slip stool.  Keep the floor dry. Clean up any water that spills on the floor as soon as it happens.  Remove  soap buildup in the tub or shower regularly.  Attach bath mats securely with double-sided non-slip rug tape.  Do not have throw rugs and other things on the floor that can make you trip. What can I do in the bedroom?  Use night lights.  Make sure that you have a light by your bed that is easy to reach.  Do not use any sheets or blankets that are too big for your bed. They should not hang down onto the floor.  Have a firm chair that has side arms. You can use this for support while you get dressed.  Do not have throw rugs and other things on the floor that can make you trip. What can I do in the kitchen?  Clean up any spills right away.  Avoid walking on wet floors.  Keep items that you use a lot in easy-to-reach places.  If you need to reach something above you, use a strong step stool that has a grab bar.  Keep electrical cords out of the way.  Do not use floor polish or wax that makes floors slippery. If you must use wax, use non-skid floor wax.  Do not have throw rugs and other things on the floor that can make you trip. What can I do  with my stairs?  Do not leave any items on the stairs.  Make sure that there are handrails on both sides of the stairs and use them. Fix handrails that are broken or loose. Make sure that handrails are as long as the stairways.  Check any carpeting to make sure that it is firmly attached to the stairs. Fix any carpet that is loose or worn.  Avoid having throw rugs at the top or bottom of the stairs. If you do have throw rugs, attach them to the floor with carpet tape.  Make sure that you have a light switch at the top of the stairs and the bottom of the stairs. If you do not have them, ask someone to add them for you. What else can I do to help prevent falls?  Wear shoes that:  Do not have high heels.  Have rubber bottoms.  Are comfortable and fit you well.  Are closed at the toe. Do not wear sandals.  If you use a  stepladder:  Make sure that it is fully opened. Do not climb a closed stepladder.  Make sure that both sides of the stepladder are locked into place.  Ask someone to hold it for you, if possible.  Clearly mark and make sure that you can see:  Any grab bars or handrails.  First and last steps.  Where the edge of each step is.  Use tools that help you move around (mobility aids) if they are needed. These include:  Canes.  Walkers.  Scooters.  Crutches.  Turn on the lights when you go into a dark area. Replace any light bulbs as soon as they burn out.  Set up your furniture so you have a clear path. Avoid moving your furniture around.  If any of your floors are uneven, fix them.  If there are any pets around you, be aware of where they are.  Review your medicines with your doctor. Some medicines can make you feel dizzy. This can increase your chance of falling. Ask your doctor what other things that you can do to help prevent falls. This information is not intended to replace advice given to you by your health care provider. Make sure you discuss any questions you have with your health care provider. Document Released: 05/21/2009 Document Revised: 12/31/2015 Document Reviewed: 08/29/2014 Elsevier Interactive Patient Education  2017 [employer].

## 2020-09-24 NOTE — Progress Notes (Signed)
Subjective:   Laura Bush is a 70 y.o. female who presents for Medicare Annual (Subsequent) preventive examination.  Review of Systems     Cardiac Risk Factors include: advanced age (>36men, >69 women);hypertension;dyslipidemia;obesity (BMI >30kg/m2)     Objective:    Today's Vitals   09/24/20 1352 09/24/20 1355  BP: 132/86   Pulse: 65   Resp: 16   Temp: 98.3 F (36.8 C)   TempSrc: Oral   SpO2: 95%   Weight: 174 lb (78.9 kg)   Height: 4\' 11"  (1.499 m)   PainSc:  3    Body mass index is 35.14 kg/m.  Advanced Directives 09/24/2020 07/29/2019 07/26/2018 05/30/2016 01/25/2016 04/29/2015 03/02/2015  Does Patient Have a Medical Advance Directive? Yes Yes Yes No No No Yes  Type of Paramedic of [place];Living will [place];Living will [place];Living will - - - Press photographer  Does patient want to make changes to medical advance directive? - No - Patient declined - - - - No - Patient declined  Copy of Lilly in Chart? Yes - validated most recent copy scanned in chart (See row information) Yes - validated most recent copy scanned in chart (See row information) Yes - validated most recent copy scanned in chart (See row information) - - - -  Would patient like information on creating a medical advance directive? - - - - - - -    Current Medications (verified) Outpatient Encounter Medications as of 09/24/2020  Medication Sig  . aMILoride (MIDAMOR) 5 MG tablet Take 1 tablet (5 mg total) by mouth daily.  . B Complex-C (B-COMPLEX WITH VITAMIN C) tablet Take 1 tablet by mouth daily.  Marland Kitchen CALCIUM CARBONATE-VITAMIN D PO Take 1 tablet by mouth 2 (two) times daily. 600mg  calcium, D3 (1000)IU  . carvedilol (COREG) 6.25 MG tablet Take 6.25 mg by mouth 2 (two) times daily with a meal.  . Elderberry 575 MG/5ML SYRP Take by mouth daily.  Marland Kitchen Fexofenadine HCl (ALLEGRA PO) Take by mouth.  .  hydrochlorothiazide (MICROZIDE) 12.5 MG capsule Take 1 capsule (12.5 mg total) by mouth daily.  . metFORMIN (GLUCOPHAGE) 500 MG tablet Take 1 tablet (500 mg total) by mouth 2 (two) times daily.  . pantoprazole (PROTONIX) 40 MG tablet Take 1 tablet (40 mg total) by mouth daily.  . simvastatin (ZOCOR) 20 MG tablet Take 1 tablet (20 mg total) by mouth daily.  Marland Kitchen telmisartan (MICARDIS) 80 MG tablet Take 1 tablet (80 mg total) by mouth daily.   No facility-administered encounter medications on file as of 09/24/2020.    Allergies (verified) Amlodipine, Diclofenac, Lanolin, and Tape   History: Past Medical History:  Diagnosis Date  . Asthma    in cold weather  . Breast cancer ([place]) 09/18/14   left breast  . Breast cancer ([place]) 10/07/14   bx left breast  . Breast cancer of upper-outer quadrant of left female breast ([place]) 09/22/2014  . Cluster headaches    in her 40's  . GERD (gastroesophageal reflux disease)   . Heart murmur    as a child (has outgrown)  . High cholesterol   . Hypertension   . Lower extremity edema   . Microscopic colitis   . Morbid obesity ([place]) 09/30/2019  . OSA on CPAP   . Personal history of radiation therapy   . Pneumonia 2000's X 1  . PONV (postoperative nausea and vomiting)   . Rosacea   .  S/P radiation therapy 12/30/14-01/27/15   left breast 50Gy total dose  . 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 Swallowing difficulty   . Venous insufficiency    Past Surgical History:  Procedure Laterality Date  . ABDOMINAL HYSTERECTOMY  1999  . APPENDECTOMY  ~ 2006  . BREAST BIOPSY Left 10/2014  . BREAST LUMPECTOMY Left 2016  . BREAST REDUCTION SURGERY Bilateral 11/11/2014   Procedure: Bilateral Breast Reduction;  Surgeon: Crissie Reese, MD;  Location: [facility];  Service: Plastics;  Laterality: Bilateral;  . CARPAL TUNNEL RELEASE Bilateral    2 surgeries on right, 1 on left  . CESAREAN SECTION  1978; 1980  . COLONOSCOPY    . FOREARM FRACTURE SURGERY Right 2003 X 3  . KNEE  ARTHROSCOPY Bilateral    meniscus repair  . PARTIAL MASTECTOMY WITH NEEDLE LOCALIZATION AND AXILLARY SENTINEL LYMPH NODE BX Left 11/11/2014   Procedure: LEFT BREAST PARTIAL MASTECTOMY WITH NEEDLE LOCALIZATION TIMES TWO AND LEFT AXILLARY SENTINEL LYMPH NODE Biopsy;  Surgeon: Fanny Skates, MD;  Location: [facility];  Service: General;  Laterality: Left;  . SQUAMOUS CELL CARCINOMA EXCISION  1980's X 1   "nose"  . TONSILLECTOMY  ~ 1957  . TUBAL LIGATION  ?1984   Family History  Problem Relation Age of Onset  . Uterine cancer Sister 97  . Stroke Father        deceased  . Heart failure Father   . Hypertension Father   . Dementia Mother        Lives in [place]  . Hypertension Mother   . Hyperlipidemia Mother   . Heart attack Maternal Grandmother   . Hypertension Maternal Grandfather   . Stroke Paternal Grandfather   . Colon cancer Neg Hx   . Esophageal cancer Neg Hx   . Pancreatic cancer Neg Hx   . Stomach cancer Neg Hx   . Liver disease Neg Hx    Social History   Socioeconomic History  . Marital status: Married    Spouse name: Randy Bush  . Number of children: Not on file  . Years of education: Not on file  . Highest education level: Not on file  Occupational History  . Occupation: retired  Tobacco Use  . Smoking status: Never Smoker  . Smokeless tobacco: Never Used  Substance and Sexual Activity  . Alcohol use: Yes    Alcohol/week: 5.0 standard drinks    Types: 5 Glasses of wine per week  . Drug use: No  . Sexual activity: Not Currently  Other Topics Concern  . Not on file  Social History Narrative   From [place] [employer].   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   2 children, boy and girl   Twin grandchildren on the way.   Quilting, sewing, reading.   Social Determinants of Health   Financial Resource Strain: Low Risk   . Difficulty of Paying Living Expenses: Not very hard  Food Insecurity: No Food Insecurity  . Worried About  Charity fundraiser in the Last Year: Never true  . Ran Out of Food in the Last Year: Never true  Transportation Needs: No Transportation Needs  . Lack of Transportation (Medical): No  . Lack of Transportation (Non-Medical): No  Physical Activity: Sufficiently Active  . Days of Exercise per Week: 7 days  . Minutes of Exercise per Session: 60 min  Stress: No Stress Concern Present  . Feeling of Stress : Not at all  Social  Connections: Socially Integrated  . Frequency of Communication with Friends and Family: More than three times a week  . Frequency of Social Gatherings with Friends and Family: More than three times a week  . Attends Religious Services: More than 4 times per year  . Active Member of Clubs or Organizations: Yes  . Attends Archivist Meetings: 1 to 4 times per year  . Marital Status: Married    Tobacco Counseling Counseling given: Not Answered   Clinical Intake:  Pre-visit preparation completed: Yes  Pain : 0-10 Pain Score: 3  Pain Type: Chronic pain Pain Location: Shoulder Pain Onset: More than a month ago Pain Frequency: Constant Pain Relieving Factors: Tylenol  Arthritis  Pain Relieving Factors: Tylenol  Arthritis  Nutritional Status: BMI > 30  Obese Nutritional Risks: None Diabetes: No  How often do you need to have someone help you when you read instructions, pamphlets, or other written materials from your doctor or pharmacy?: 1 - Never  Diabetic?No  Interpreter Needed?: No  Information entered by :: Caroleen Hamman LPN   Activities of Daily Living In your present state of health, do you have any difficulty performing the following activities: 09/24/2020  Hearing? N  Vision? N  Difficulty concentrating or making decisions? N  Walking or climbing stairs? N  Dressing or bathing? N  Doing errands, shopping? N  Preparing Food and eating ? N  Using the Toilet? N  In the past six months, have you accidently leaked urine? N  Do you have  problems with loss of bowel control? N  Managing your Medications? N  Managing your Finances? N  Housekeeping or managing your Housekeeping? N  Some recent data might be hidden    Patient Care Team: Copland, Gay Filler, MD as PCP - General (Family Medicine) Skeet Latch, MD as Attending Physician (Cardiology) Day, Melvenia Beam, [facility] (Inactive) (Pharmacist)  Indicate any recent Medical Services you may have received from other than [facility] providers in the past year (date may be approximate).     Assessment:   This is a routine wellness examination for Antaniya.  Hearing/Vision screen  Hearing Screening   125Hz  250Hz  500Hz  1000Hz  2000Hz  3000Hz  4000Hz  6000Hz  8000Hz   Right ear:           Left ear:           Comments: No issues  Vision Screening Comments: Wears glasses Last eye exam-2020 Dr. Phineas Douglas  Dietary issues and exercise activities discussed: Current Exercise Habits: Home exercise routine, Type of exercise: yoga, Time (Minutes): 60, Frequency (Times/Week): 3, Weekly Exercise (Minutes/Week): 180, Intensity: Mild, Exercise limited by: None identified  Goals    . 130/80    . Chronic Care Management Pharmacy Care Plan     CARE PLAN ENTRY (see longitudinal plan of care for additional care plan information)  Current Barriers:  . Chronic Disease Management support, education, and care coordination needs related to HTN, HLD, Metabolic Syndrome, GERD, Allergic Rhinitis, Osteopenia, Hx of Breast Cancer   Hypertension BP Readings from Last 3 Encounters:  04/29/20 124/76  03/09/20 (!) 175/98  04/21/20 128/80   . Pharmacist Clinical Goal(s): o Over the next 180 days, patient will work with PharmD and providers to maintain BP goal <140/90 . Current regimen:   Telmisartan 80mg  1/2 tab daily  Hydrochlorothiazide 25mg  1/2 tab daily  Carvedilol 6.25mg  1/2 tab twice daily  Amiloride 5mg  daily . Patient self care activities - Over the next 180 days, patient will: o Check BP every  other day, document, and provide at future appointments o Ensure daily salt intake < 2300 mg/day  Hyperlipidemia Lab Results  Component Value Date/Time   LDLCALC 70 04/06/2020 10:59 AM   . Pharmacist Clinical Goal(s): o Over the next 180 days, patient will work with PharmD and providers to maintain LDL goal < 100 . Current regimen:  o Simvastatin 20mg  daily . Patient self care activities - Over the next 180 days, patient will: o Maintain cholesterol medication regimen.   Metabolic Syndrome Lab Results  Component Value Date/Time   HGBA1C 5.2 04/06/2020 10:59 AM   HGBA1C 5.6 04/03/2019 10:37 AM   . Pharmacist Clinical Goal(s): o Over the next 180 days, patient will work with PharmD and providers to maintain A1c goal <6.5% . Current regimen:  o Metformin 500mg  twice daily . Interventions: o Discussed dosing of metformin.  . Patient self care activities - Over the next 180 days, patient will: o Maintain a1c <6.5%  Medication management . Pharmacist Clinical Goal(s): o Over the next 180 days, patient will work with PharmD and providers to maintain optimal medication adherence . [facility] . Interventions o Comprehensive medication review performed. o Utilize [facility] pharmacy for medication synchronization, packaging and delivery . Patient self care activities - Over the next 180 days, patient will: o Focus on medication adherence by filling and taking medications appropriately  o Take medications as prescribed o Report any questions or concerns to PharmD and/or provider(s)  Please see past updates related to this goal by clicking on the "Past Updates" button in the selected goal     . Consider completing DEXA Scan around 07/26/20 or later    . Continue plant based diet to help with    . Increase physical activity    . LDL goal less than 100    . Triglyceride (TRIG) level less than 150      Depression Screen PHQ 2/9 Scores 09/24/2020 10/17/2019  07/29/2019 07/26/2018  PHQ - 2 Score 0 2 0 0  PHQ- 9 Score - 5 - -    Fall Risk Fall Risk  09/24/2020 07/26/2018 03/02/2015 12/15/2014  Falls in the past year? 0 0 No No  Number falls in past yr: 0 - - -  Injury with Fall? 0 - - -  Follow up Falls prevention discussed - - -    FALL RISK PREVENTION PERTAINING TO THE HOME:  Any stairs in or around the home? No  Home free of loose throw rugs in walkways, pet beds, electrical cords, etc? Yes  Adequate lighting in your home to reduce risk of falls? Yes   ASSISTIVE DEVICES UTILIZED TO PREVENT FALLS:  Life alert? No  Use of a cane, walker or w/c? No  Grab bars in the bathroom? Yes  Shower chair or bench in shower? No  Elevated toilet seat or a handicapped toilet? No   TIMED UP AND GO:  Was the test performed? Yes .  Length of time to ambulate 10 feet: 10 sec.   Gait steady and fast without use of assistive device  Cognitive Function:Normal cognitive status assessed by direct observation by this Nurse Health Advisor. No abnormalities found.         Immunizations Immunization History  Administered Date(s) Administered  . Fluad Quad(high Dose 65+) 04/03/2019, 04/29/2020  . Hepatitis A 08/24/1995, 12/23/1996  . Hepatitis B 12/23/1996, 02/14/1997, 05/24/1997, 06/04/1998  . IPV 08/24/1995  . Influenza Split 04/06/2017  . Influenza, High Dose Seasonal PF 05/10/2016,  04/06/2017  . Influenza,inj,Quad PF,6+ Mos 04/30/2015  . Influenza-Unspecified 07/07/2005, 06/08/2006, 05/28/2007, 04/23/2008, 05/23/2011, 05/14/2012, 05/20/2013, 04/12/2018, 04/03/2019  . Meningococcal Conjugate 05/07/2008  . Meningococcal Mcv4o 05/07/2008  . PFIZER(Purple Top)SARS-COV-2 Vaccination 09/13/2019, 10/08/2019, 05/21/2020  . Pneumococcal Conjugate-13 03/22/2017  . Pneumococcal Polysaccharide-23 06/08/2006, 03/28/2018  . Tdap 09/19/2011  . Typhoid Inactivated 05/07/2008  . Yellow Fever 05/08/2008  . Zoster 06/21/2013  . Zoster Recombinat (Shingrix)  10/06/2018, 01/18/2019    TDAP status: Up to date  Flu Vaccine status: Up to date  Pneumococcal vaccine status: Up to date  Covid-19 vaccine status: Completed vaccines  Qualifies for Shingles Vaccine? No   Zostavax completed Yes   Shingrix Completed?: Yes  Screening Tests Health Maintenance  Topic Date Due  . Fecal DNA (Cologuard)  04/05/2020  . COVID-19 Vaccine (4 - Booster for Pfizer series) 11/19/2020  . TETANUS/TDAP  09/18/2021  . MAMMOGRAM  09/15/2022  . INFLUENZA VACCINE  Completed  . DEXA SCAN  Completed  . Hepatitis C Screening  Completed  . PNA vac Low Risk Adult  Completed    Health Maintenance  Health Maintenance Due  Topic Date Due  . Fecal DNA (Cologuard)  04/05/2020    Colorectal cancer screening: Type of screening: Colonoscopy. Completed 04/09/2020. Repeat every 5 years  Mammogram status: Completed Bilateral 09/15/20. Repeat every year  Bone Density status: Ordered today. Pt provided with contact info and advised to call to schedule appt.  Lung Cancer Screening: (Low Dose CT Chest recommended if Age 37-80 years, 30 pack-year currently smoking OR have quit w/in 15years.) does not qualify.    Additional Screening:  Hepatitis C Screening:Completed 01/29/2016  Vision Screening: Recommended annual ophthalmology exams for early detection of glaucoma and other disorders of the eye. Is the patient up to date with their annual eye exam?  No  Who is the provider or what is the name of the office in which the patient attends annual eye exams? Dr. Phineas Douglas   Dental Screening: Recommended annual dental exams for proper oral hygiene  Community Resource Referral / Chronic Care Management: CRR required this visit?  No   CCM required this visit?  No      Plan:     I have personally reviewed and noted the following in the patient's chart:   . Medical and social history . Use of alcohol, tobacco or illicit drugs  . Current medications and  supplements . Functional ability and status . Nutritional status . Physical activity . Advanced directives . List of other physicians . Hospitalizations, surgeries, and ER visits in previous 12 months . Vitals . Screenings to include cognitive, depression, and falls . Referrals and appointments  In addition, I have reviewed and discussed with patient certain preventive protocols, quality metrics, and best practice recommendations. A written personalized care plan for preventive services as well as general preventive health recommendations were provided to patient.     Marta Antu, LPN   [id number]  Nurse Health Advisor  Nurse Notes: None

## 2020-09-30 ENCOUNTER — Other Ambulatory Visit: Payer: Self-pay

## 2020-09-30 ENCOUNTER — Ambulatory Visit: Payer: Medicare HMO | Attending: Family Medicine | Admitting: Physical Therapy

## 2020-09-30 ENCOUNTER — Encounter: Payer: Self-pay | Admitting: Physical Therapy

## 2020-09-30 DIAGNOSIS — M6281 Muscle weakness (generalized): Secondary | ICD-10-CM | POA: Insufficient documentation

## 2020-09-30 DIAGNOSIS — R293 Abnormal posture: Secondary | ICD-10-CM | POA: Insufficient documentation

## 2020-09-30 DIAGNOSIS — M542 Cervicalgia: Secondary | ICD-10-CM | POA: Insufficient documentation

## 2020-09-30 DIAGNOSIS — M25512 Pain in left shoulder: Secondary | ICD-10-CM | POA: Diagnosis not present

## 2020-09-30 DIAGNOSIS — M25511 Pain in right shoulder: Secondary | ICD-10-CM | POA: Insufficient documentation

## 2020-09-30 NOTE — Therapy (Signed)
[facility] [address]  [place], [place], [address] Phone: [phone]   Fax:  [phone]  Physical Therapy Evaluation  Patient Details  Name: Laura Bush MRN: [id number] Date of Birth: 1951/05/30 Referring Provider (PT): Gregor Hams, MD   Encounter Date: 09/30/2020   PT End of Session - 09/30/20 0955    Visit Number 1    Number of Visits 12    Date for PT Re-Evaluation 11/11/20    Authorization Type [employer]    PT Start Time [phone]    PT Stop Time 1051    PT Time Calculation (min) 56 min    Activity Tolerance Patient tolerated treatment well    Behavior During Therapy [facility] for tasks assessed/performed           Past Medical History:  Diagnosis Date  . Asthma    in cold weather  . Breast cancer ([place]) 09/18/14   left breast  . Breast cancer ([place]) 10/07/14   bx left breast  . Breast cancer of upper-outer quadrant of left female breast ([place]) 09/22/2014  . Cluster headaches    in her 40's  . GERD (gastroesophageal reflux disease)   . Heart murmur    as a child (has outgrown)  . High cholesterol   . Hypertension   . Lower extremity edema   . Microscopic colitis   . Morbid obesity ([place]) 09/30/2019  . OSA on CPAP   . Personal history of radiation therapy   . Pneumonia 2000's X 1  . PONV (postoperative nausea and vomiting)   . Rosacea   . S/P radiation therapy 12/30/14-01/27/15   left breast 50Gy total dose  . 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 Swallowing difficulty   . Venous insufficiency     Past Surgical History:  Procedure Laterality Date  . ABDOMINAL HYSTERECTOMY  1999  . APPENDECTOMY  ~ 2006  . BREAST BIOPSY Left 10/2014  . BREAST LUMPECTOMY Left 2016  . BREAST REDUCTION SURGERY Bilateral 11/11/2014   Procedure: Bilateral Breast Reduction;  Surgeon: Crissie Reese, MD;  Location: [facility];  Service: Plastics;  Laterality: Bilateral;  . CARPAL TUNNEL RELEASE Bilateral    2  surgeries on right, 1 on left  . CESAREAN SECTION  1978; 1980  . COLONOSCOPY    . FOREARM FRACTURE SURGERY Right 2003 X 3  . KNEE ARTHROSCOPY Bilateral    meniscus repair  . PARTIAL MASTECTOMY WITH NEEDLE LOCALIZATION AND AXILLARY SENTINEL LYMPH NODE BX Left 11/11/2014   Procedure: LEFT BREAST PARTIAL MASTECTOMY WITH NEEDLE LOCALIZATION TIMES TWO AND LEFT AXILLARY SENTINEL LYMPH NODE Biopsy;  Surgeon: Fanny Skates, MD;  Location: [facility];  Service: General;  Laterality: Left;  . SQUAMOUS CELL CARCINOMA EXCISION  1980's X 1   "nose"  . TONSILLECTOMY  ~ 1957  . TUBAL LIGATION  ?1984    There were no vitals filed for this visit.    Subjective Assessment - 09/30/20 0959    Subjective Pt reports onset of upper posterior shoulder and neck pain originating in Oct 2021 w/o known MOI other than potentially working in her garden. Also has been doing a lot of quilting and sewing since the fall. She reports she will lay over a yoga roll to stretch which will give her some relief.    Diagnostic tests 09/17/20 - shoulder x-rays: L - Mild AC joint degenerative  changes; R - Mild glenohumeral and moderate AC joint degenerative changes  Patient Stated Goals "some direction on movement or motion to do to prevent or alleviate the discomfort"    Currently in Pain? Yes    Pain Score 4    3-4/10   Pain Location Back    Pain Orientation Upper    Pain Descriptors / Indicators Dull;Aching    Pain Type Acute pain    Pain Radiating Towards spreads into B scpaulae & shoulders and occasionally up into neck    Pain Onset More than a month ago   Oct 2021   Pain Frequency Intermittent    Aggravating Factors  unpredictable    Pain Relieving Factors Tylenol Arthritis, Aleve, Ibuprofen    Effect of Pain on Daily Activities no specific limitations              [facility] PT Assessment - 09/30/20 0955      Assessment   Medical Diagnosis Chronic pain of B shoulders, upper back & neck    Referring Provider (PT) Gregor Hams, MD    Onset Date/Surgical Date --   Oct 2021   Hand Dominance Right    Next MD Visit 10/29/20    Prior Therapy remote h/o PT for bone spur in R shoulder      Precautions   Precaution Comments h/o L breast cancer s/p generous lumpectomy      Balance Screen   Has the patient fallen in the past 6 months No    Has the patient had a decrease in activity level because of a fear of falling?  No    Is the patient reluctant to leave their home because of a fear of falling?  No      Home [place] residence    Living Arrangements Spouse/significant other      Prior Function   Level of [place] Retired    Leisure being a grandmother; sewing, painting, gardening; yoga 3x/wk x 1 hr, walking 2 miles/day x 5 days/wk in warm weather      Cognition   Overall Cognitive Status Within Functional Limits for tasks assessed      Observation/Other Assessments   Focus on Therapeutic Outcomes (FOTO)  Shoulder: FS = 60; predicted discharge FS = 67      Posture/Postural Control   Posture/Postural Control Postural limitations    Postural Limitations Forward head;Rounded Shoulders   very mild   Posture Comments L shoulder slightly elevated relative to R      ROM / Strength   AROM / PROM / Strength AROM;Strength      AROM   Overall AROM  Within functional limits for tasks performed    Overall AROM Comments slight limitation in B FIR      Strength   Overall Strength Comments pain/discomfort with B flexion & abduction, L IR    Strength Assessment Site Shoulder    Right/Left Shoulder Right;Left    Right Shoulder Flexion 4/5    Right Shoulder ABduction 4/5    Right Shoulder Internal Rotation 4+/5    Right Shoulder External Rotation 4/5    Left Shoulder Flexion 4/5    Left Shoulder ABduction 4/5    Left Shoulder Internal Rotation 4/5    Left Shoulder External Rotation 4-/5      Palpation   Palpation comment increased muscle  tension and ttp in B UT, LS, pecs and periscapular muscles  Objective measurements completed on examination: See above findings.               PT Education - 09/30/20 1051    Education Details PT eval findings, anticipated POC & initial HEP - Access Code: [email or web address]    Person(s) Educated Patient    Methods Explanation;Demonstration;Verbal cues;Tactile cues;Handout    Comprehension Verbalized understanding;Verbal cues required;Tactile cues required;Returned demonstration;Need further instruction            PT Short Term Goals - 09/30/20 1051      PT SHORT TERM GOAL #1   Title Patient will be independent with initial HEP    Status New    Target Date 10/21/20             PT Long Term Goals - 09/30/20 1051      PT LONG TERM GOAL #1   Title Patient will be independent with ongoing/advanced HEP for self-management at home    Status New    Target Date 11/11/20      PT LONG TERM GOAL #2   Title Improve posture and alignment with patient to demonstrate improved upright posture with posterior shoulder girdle engaged    Status New    Target Date 11/11/20      PT LONG TERM GOAL #3   Title Patient to improve B shoulder AROM to WNL without pain provocation    Status New    Target Date 11/11/20      PT LONG TERM GOAL #4   Title Patient will demonstrate improved b shoulder strength to >/= 4+/5 w/o pain provocation for functional UE use    Status New    Target Date 11/11/20      PT LONG TERM GOAL #5   Title Patient to report ability to perform ADLs, household, and work-related tasks without increased pain    Status New    Target Date 11/11/20                  Plan - 09/30/20 1051    Clinical Impression Statement [name] is a 70 y/o female who presents to OP PT for B posterior/upper shoulder, upper back and neck pain originating in Oct 2021. No known MOI but pt reports having done a lot of gardening as well as quilting/sewing which  likely had her in a forward flexed posture for extended periods. She participates in yoga 3x/wk and is able to get some relief from stretching over a yoga roll. Current deficits include intermittent B shoulder, periscapular/upper back & neck pain (L>R), postural abnormalities, very mild limitations in B shoulder FIR and L shoulder flexion & abduction, decreased B shoulder strength with pain upon resisted MMT, and increased muscle tenson and ttp in B upper shoulders, pecs and periscapular muscles. [name] will benefit from skilled PT to address above deficits, improve posture and reduce/normalize muscle tension and improve B shoulder strength to allow her to resume normal daily activities without pain interference.    Personal Factors and Comorbidities Time since onset of injury/illness/exacerbation;Past/Current Experience;Comorbidity 3+    Comorbidities L breast cancer with partial mastectomy 2016, B breast reduction surgery at time of mastectomy, h/o bone spur R shoulder, B CTR, R forearm fx, B knee scope with meniscal repair, HTN, osteopenia, bilateral BBB, GERD    Stability/Clinical Decision Making Stable/Uncomplicated    Clinical Decision Making Low    Rehab Potential Good    PT Frequency 2x / week    PT Duration 6 weeks    PT  Treatment/Interventions ADLs/Self Care Home Management;Cryotherapy;Electrical Stimulation;Iontophoresis 4mg /ml Dexamethasone;Moist Heat;Functional mobility training;Therapeutic activities;Therapeutic exercise;Neuromuscular re-education;Patient/family education;Manual techniques;Passive range of motion;Dry needling;Taping;Spinal Manipulations    PT Next Visit Plan Review initial HEP; progress postural stretching and strengthening; manual therapy and modalities PRN to address increased muscle tension/pain    PT Home Exercise Plan MedBridge Access Code: [email or web address] (2/23)    Consulted and Agree with Plan of Care Patient           Patient will benefit from skilled therapeutic  intervention in order to improve the following deficits and impairments:  Pain,Postural dysfunction,Improper body mechanics,Increased fascial restricitons,Increased muscle spasms,Impaired flexibility,Decreased range of motion,Decreased strength,Decreased activity tolerance  Visit Diagnosis: Acute pain of left shoulder  Acute pain of right shoulder  Cervicalgia  Abnormal posture  Muscle weakness (generalized)     Problem List Patient Active Problem List   Diagnosis Date Noted  . Metabolic syndrome 27/25/3664  . Mixed hyperlipidemia 04/21/2020  . Polyp of ascending colon 04/21/2020  . History of breast cancer, Tamoxifen, nearing five years, Dr. Griffith Citron 04/21/2020  . Morbid obesity ([place]) 09/30/2019  . Osteopenia 10/02/2018  . Malignant 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, estrogen receptor positive ([place]) 05/29/2017  . 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 Vitamin D deficiency 01/29/2016  . Eczema 12/29/2014  . Venous insufficiency 10/16/2014  . Essential hypertension 10/01/2014  . Hypercholesteremia 10/01/2014  . GERD (gastroesophageal reflux disease) 10/01/2014  . Other bilateral bundle branch block 06/23/2003    Percival Spanish, PT, MPT 09/30/2020, 2:02 PM  [facility] [address]  [place], [place], [address] Phone: [phone]   Fax:  [phone]  Name: Laura Bush MRN: [id number] Date of Birth: 12-Jun-1951

## 2020-09-30 NOTE — Patient Instructions (Addendum)
       Access Code: [email or web address] URL: [email or web address] Date: 09/30/2020 Prepared by: Annie Paras  Exercises Seated Cervical Sidebending Stretch - 2-3 x daily - 7 x weekly - 3 reps - 30 sec hold Gentle Levator Scapulae Stretch - 2-3 x daily - 7 x weekly - 3 reps - 30 sec hold Doorway Pec Stretch at 60 Degrees Abduction with Arm Straight - 2-3 x daily - 7 x weekly - 3 reps - 30 sec hold Doorway Pec Stretch at 90 Degrees Abduction - 2-3 x daily - 7 x weekly - 3 reps - 30 sec hold Doorway Pec Stretch at 120 Degrees Abduction - 2-3 x daily - 7 x weekly - 3 reps - 30 sec hold Scapular Retraction with Resistance - 1 x daily - 7 x weekly - 2 sets - 10 reps - 3-5 sec hold Scapular Retraction with Resistance Advanced - 1 x daily - 7 x weekly - 2 sets - 10 reps - 3-5 sec hold

## 2020-10-03 NOTE — Patient Instructions (Addendum)
Great to see you again today- assuming all is well please see me in 6 months Have a great time with your grandkids!    Stop by the ground floor imaging dept today and see if they can do your bone density while you are here

## 2020-10-03 NOTE — Progress Notes (Addendum)
[facility] [place], [place], [place] [address] [phone]  Date:  10/05/2020   Name:  Laura Bush   DOB:  [birth year]   MRN:  [id number]  PCP:  Laura Mclean, MD    Chief Complaint: Hypertension   History of Present Illness:  Laura Bush is a 70 y.o. very pleasant female patient who presents with the following:  6 month follow-up visit today Last seen by myself in September/ physical in August  PMH includes hypertension, venous insufficiency, GERD, hypercholesterolemia, and morbid obesity,  breast cancer diagnosed 2016 followed by oncology  She has been released to annual screening mammogram, completed course of tamoxifen  She is also being followed by the healthy weight [facility] wellness clinic, following a pescaterian diet currently- also a little chicken  They have her taking metformin  Last visit with them earlier this month  She is seeing by neurology for her CPAP  cologuard is a bit overdue- she had a colonoscopy 04/09/20 covid complete-  mammo UTD- earliesr this month  shingrix complete  She had some labs in November- CMP and CBC A1c in August normal Can update tsh , lipid today   amloride Coreg hctz Metformin protonix zocor telmisartan   She is now doing some PT for her shoulder pain  She was taking NSAIDs for pain but these raised her BP- she is now using more tylenol   Wt Readings from Last 3 Encounters:  10/05/20 175 lb (79.4 kg)  09/24/20 174 lb (78.9 kg)  09/17/20 175 lb 12.8 oz (79.7 kg)   Her weight loss has slowed and been more on a plateau-admits she has not exercised as much as she would like due to cold weather.  She does not like going to gym is due to COVID-19  She has grandchildren locally and in [place]  Patient Active Problem List   Diagnosis Date Noted  . Metabolic syndrome 35/57/3220  . Mixed hyperlipidemia 04/21/2020  . Polyp of ascending  colon 04/21/2020  . History of breast cancer, Tamoxifen, nearing five years, Dr. Griffith Citron 04/21/2020  . Morbid obesity ([place]) 09/30/2019  . Osteopenia 10/02/2018  . Malignant 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, estrogen receptor positive ([place]) 05/29/2017  . 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 Vitamin D deficiency 01/29/2016  . Eczema 12/29/2014  . Venous insufficiency 10/16/2014  . Essential hypertension 10/01/2014  . Hypercholesteremia 10/01/2014  . GERD (gastroesophageal reflux disease) 10/01/2014  . Other bilateral bundle branch block 06/23/2003    Past Medical History:  Diagnosis Date  . Asthma    in cold weather  . Breast cancer ([place]) 09/18/14   left breast  . Breast cancer ([place]) 10/07/14   bx left breast  . Breast cancer of upper-outer quadrant of left female breast ([place]) 09/22/2014  . Cluster headaches    in her 40's  . GERD (gastroesophageal reflux disease)   . Heart murmur    as a child (has outgrown)  . High cholesterol   . Hypertension   . Lower extremity edema   . Microscopic colitis   . Morbid obesity ([place]) 09/30/2019  . OSA on CPAP   . Personal history of radiation therapy   . Pneumonia 2000's X 1  . PONV (postoperative nausea and vomiting)   . Rosacea   . S/P radiation therapy 12/30/14-01/27/15   left breast 50Gy total dose  . 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 Swallowing difficulty   . Venous insufficiency     Past Surgical History:  Procedure Laterality Date  . ABDOMINAL HYSTERECTOMY  1999  . APPENDECTOMY  ~ 2006  . BREAST BIOPSY Left 10/2014  . BREAST LUMPECTOMY Left 2016  . BREAST REDUCTION SURGERY Bilateral 11/11/2014   Procedure: Bilateral Breast Reduction;  Surgeon: Crissie Reese, MD;  Location: [facility];  Service: Plastics;  Laterality: Bilateral;  . CARPAL TUNNEL RELEASE Bilateral    2 surgeries on right, 1 on left  . CESAREAN SECTION  1978; 1980  . COLONOSCOPY    . FOREARM FRACTURE SURGERY Right  2003 X 3  . KNEE ARTHROSCOPY Bilateral    meniscus repair  . PARTIAL MASTECTOMY WITH NEEDLE LOCALIZATION AND AXILLARY SENTINEL LYMPH NODE BX Left 11/11/2014   Procedure: LEFT BREAST PARTIAL MASTECTOMY WITH NEEDLE LOCALIZATION TIMES TWO AND LEFT AXILLARY SENTINEL LYMPH NODE Biopsy;  Surgeon: Fanny Skates, MD;  Location: [facility];  Service: General;  Laterality: Left;  . SQUAMOUS CELL CARCINOMA EXCISION  1980's X 1   "nose"  . TONSILLECTOMY  ~ 1957  . TUBAL LIGATION  ?1984    Social History   Tobacco Use  . Smoking status: Never Smoker  . Smokeless tobacco: Never Used  Substance Use Topics  . Alcohol use: Yes    Alcohol/week: 5.0 standard drinks    Types: 5 Glasses of wine per week  . Drug use: No    Family History  Problem Relation Age of Onset  . Uterine cancer Sister 42  . Stroke Father        deceased  . Heart failure Father   . Hypertension Father   . Dementia Mother        Lives in [place]  . Hypertension Mother   . Hyperlipidemia Mother   . Heart attack Maternal Grandmother   . Hypertension Maternal Grandfather   . Stroke Paternal Grandfather   . Colon cancer Neg Hx   . Esophageal cancer Neg Hx   . Pancreatic cancer Neg Hx   . Stomach cancer Neg Hx   . Liver disease Neg Hx     Allergies  Allergen Reactions  . Amlodipine Swelling  . Diclofenac Hypertension  . Lanolin   . Tape Other (See Comments)    Redness (Bandaids also)    Medication list has been reviewed and updated.  Current Outpatient Medications on File Prior to Visit  Medication Sig Dispense Refill  . aMILoride (MIDAMOR) 5 MG tablet Take 1 tablet (5 mg total) by mouth daily. 90 tablet 3  . B Complex-C (B-COMPLEX WITH VITAMIN C) tablet Take 1 tablet by mouth daily.    Marland Kitchen CALCIUM CARBONATE-VITAMIN D PO Take 1 tablet by mouth 2 (two) times daily. 600mg  calcium, D3 (1000)IU    . carvedilol (COREG) 6.25 MG tablet Take 6.25 mg by mouth 2 (two) times daily with a meal.    . Elderberry 575 MG/5ML SYRP  Take by mouth daily.    Marland Kitchen Fexofenadine HCl (ALLEGRA PO) Take by mouth.    . hydrochlorothiazide (MICROZIDE) 12.5 MG capsule Take 1 capsule (12.5 mg total) by mouth daily. 90 capsule 3  . metFORMIN (GLUCOPHAGE) 500 MG tablet Take 1 tablet (500 mg total) by mouth 2 (two) times daily. 180 tablet 0  . pantoprazole (PROTONIX) 40 MG tablet Take 1 tablet (40 mg total) by mouth daily. 90 tablet 3  . simvastatin (ZOCOR) 20 MG tablet Take 1 tablet (20 mg total) by  mouth daily. 90 tablet 3  . telmisartan (MICARDIS) 80 MG tablet Take 1 tablet (80 mg total) by mouth daily. 90 tablet 3   No current facility-administered medications on file prior to visit.    Review of Systems:  As per HPI- otherwise negative.   Physical Examination: Vitals:   10/05/20 1032  BP: 128/82  Pulse: 67  Resp: 16  Temp: 98.5 F (36.9 C)  SpO2: 98%   Vitals:   10/05/20 1032  Weight: 175 lb (79.4 kg)  Height: 4\' 11"  (1.499 m)   Body mass index is 35.35 kg/m. Ideal Body Weight: Weight in (lb) to have BMI = 25: 123.5  GEN: no acute distress.  Obese, otherwise looks well HEENT: Atraumatic, Normocephalic. Bilateral TM wnl, oropharynx normal.  PEERL,EOMI.   Ears and Nose: No external deformity. CV: RRR, No M/G/R. No JVD. No thrill. No extra heart sounds. PULM: CTA B, no wheezes, crackles, rhonchi. No retractions. No resp. distress. No accessory muscle use. ABD: S, NT, ND, No rebound. No HSM. EXTR: No c/c/e PSYCH: Normally interactive. Conversant.   BP Readings from Last 3 Encounters:  10/05/20 128/82  09/24/20 132/86  09/17/20 (!) 152/96    Assessment and Plan: Essential hypertension - Plan: aMILoride (MIDAMOR) 5 MG tablet  Other hyperlipidemia  Insulin resistance  Fatigue, unspecified type - Plan: TSH, VITAMIN D 25 Hydroxy (Vit-D Deficiency, Fractures)  Mixed hyperlipidemia - Plan: Lipid panel  Osteopenia, unspecified location - Plan: DG Bone Density  Medication monitoring encounter - Plan: Basic  metabolic panel Patient following up today Blood pressure under good control Discussed labs, update as needed We will check lipids, thyroid, vitamin D today Ordered bone density scan We discussed her weight, encouraged continued exercise efforts She is traveling to [place] later today to visit her grandchildren This visit occurred during the SARS-CoV-2 public health emergency.  Safety protocols were in place, including screening questions prior to the visit, additional usage of staff PPE, and extensive cleaning of exam room while observing appropriate contact time as indicated for disinfecting solutions.    Signed Lamar Blinks, MD   Received her labs as follows, message to pt  Results for orders placed or performed in visit on 10/05/20  Lipid panel  Result Value Ref Range   Cholesterol 156 0 - 200 mg/dL   Triglycerides 182.0 (H) 0.0 - 149.0 mg/dL   HDL 54.30 >39.00 mg/dL   VLDL 36.4 0.0 - 40.0 mg/dL   LDL Cholesterol 66 0 - 99 mg/dL   Total CHOL/HDL Ratio 3    NonHDL 101.93   TSH  Result Value Ref Range   TSH 0.88 0.35 - 4.50 uIU/mL  VITAMIN D 25 Hydroxy (Vit-D Deficiency, Fractures)  Result Value Ref Range   VITD 44.11 30.00 - 100.00 ng/mL  Basic metabolic panel  Result Value Ref Range   Sodium 141 135 - 145 mEq/L   Potassium 4.5 3.5 - 5.1 mEq/L   Chloride 104 96 - 112 mEq/L   CO2 30 19 - 32 mEq/L   Glucose, Bld 95 70 - 99 mg/dL   BUN 19 6 - 23 mg/dL   Creatinine, Ser 0.72 0.40 - 1.20 mg/dL   GFR 85.23 >60.00 mL/min   Calcium 10.0 8.4 - 10.5 mg/dL

## 2020-10-05 ENCOUNTER — Ambulatory Visit (INDEPENDENT_AMBULATORY_CARE_PROVIDER_SITE_OTHER): Payer: Medicare HMO | Admitting: Family Medicine

## 2020-10-05 ENCOUNTER — Encounter: Payer: Self-pay | Admitting: Family Medicine

## 2020-10-05 ENCOUNTER — Ambulatory Visit (HOSPITAL_BASED_OUTPATIENT_CLINIC_OR_DEPARTMENT_OTHER)
Admission: RE | Admit: 2020-10-05 | Discharge: 2020-10-05 | Disposition: A | Discharge status: Home / Self Care | Payer: Medicare HMO | Source: Ambulatory Visit | Attending: Family Medicine | Admitting: Family Medicine

## 2020-10-05 ENCOUNTER — Other Ambulatory Visit: Payer: Self-pay

## 2020-10-05 VITALS — BP 128/82 | HR 67 | Temp 98.5°F | Resp 16 | Ht 59.0 in | Wt 175.0 lb

## 2020-10-05 DIAGNOSIS — Z780 Asymptomatic menopausal state: Secondary | ICD-10-CM | POA: Insufficient documentation

## 2020-10-05 DIAGNOSIS — E7849 Other hyperlipidemia: Secondary | ICD-10-CM

## 2020-10-05 DIAGNOSIS — M8580 Other specified disorders of bone density and structure, unspecified site: Secondary | ICD-10-CM

## 2020-10-05 DIAGNOSIS — Z13820 Encounter for screening for osteoporosis: Secondary | ICD-10-CM | POA: Insufficient documentation

## 2020-10-05 DIAGNOSIS — E2839 Other primary ovarian failure: Secondary | ICD-10-CM | POA: Diagnosis not present

## 2020-10-05 DIAGNOSIS — R5383 Other fatigue: Secondary | ICD-10-CM

## 2020-10-05 DIAGNOSIS — I10 Essential (primary) hypertension: Secondary | ICD-10-CM | POA: Diagnosis not present

## 2020-10-05 DIAGNOSIS — E782 Mixed hyperlipidemia: Secondary | ICD-10-CM | POA: Diagnosis not present

## 2020-10-05 DIAGNOSIS — E8881 Metabolic syndrome: Secondary | ICD-10-CM

## 2020-10-05 DIAGNOSIS — Z5181 Encounter for therapeutic drug level monitoring: Secondary | ICD-10-CM | POA: Diagnosis not present

## 2020-10-05 DIAGNOSIS — M85852 Other specified disorders of bone density and structure, left thigh: Secondary | ICD-10-CM | POA: Insufficient documentation

## 2020-10-05 LAB — BASIC METABOLIC PANEL
BUN: 19 mg/dL (ref 6–23)
CO2: 30 mEq/L (ref 19–32)
Calcium: 10 mg/dL (ref 8.4–10.5)
Chloride: 104 mEq/L (ref 96–112)
Creatinine, Ser: 0.72 mg/dL (ref 0.40–1.20)
GFR: 85.23 mL/min (ref >60.00)
Glucose, Bld: 95 mg/dL (ref 70–99)
Potassium: 4.5 mEq/L (ref 3.5–5.1)
Sodium: 141 mEq/L (ref 135–145)

## 2020-10-05 LAB — TSH: TSH: 0.88 u[IU]/mL (ref 0.35–4.50)

## 2020-10-05 LAB — LIPID PANEL
Cholesterol: 156 mg/dL (ref 0–200)
HDL: 54.3 mg/dL (ref >39.00)
LDL Cholesterol: 66 mg/dL (ref 0–99)
NonHDL: 101.93
Total CHOL/HDL Ratio: 3
Triglycerides: 182 mg/dL — ABNORMAL HIGH (ref 0.0–149.0)
VLDL: 36.4 mg/dL (ref 0.0–40.0)

## 2020-10-05 LAB — VITAMIN D 25 HYDROXY (VIT D DEFICIENCY, FRACTURES): VITD: 44.11 ng/mL (ref 30.00–100.00)

## 2020-10-05 MED ORDER — AMILORIDE HCL 5 MG PO TABS: 5.0000 mg | ORAL_TABLET | Freq: Every day | ORAL | 3 refills | Status: DC

## 2020-10-07 ENCOUNTER — Ambulatory Visit: Payer: Medicare HMO | Admitting: Cardiovascular Disease

## 2020-10-13 ENCOUNTER — Other Ambulatory Visit: Payer: Self-pay | Admitting: Family Medicine

## 2020-10-13 ENCOUNTER — Other Ambulatory Visit: Payer: Self-pay

## 2020-10-13 ENCOUNTER — Ambulatory Visit: Payer: Medicare HMO | Attending: Family Medicine

## 2020-10-13 ENCOUNTER — Encounter: Payer: Self-pay | Admitting: Family Medicine

## 2020-10-13 DIAGNOSIS — R293 Abnormal posture: Secondary | ICD-10-CM

## 2020-10-13 DIAGNOSIS — M6281 Muscle weakness (generalized): Secondary | ICD-10-CM

## 2020-10-13 DIAGNOSIS — M25512 Pain in left shoulder: Secondary | ICD-10-CM

## 2020-10-13 DIAGNOSIS — M542 Cervicalgia: Secondary | ICD-10-CM

## 2020-10-13 NOTE — Therapy (Signed)
[facility] [address]  [place], [place], [address] Phone: [phone]   Fax:  [phone]  Physical Therapy Treatment  Patient Details  Name: Laura Bush MRN: [id number] Date of Birth: 06-26-1951 Referring Provider (PT): Gregor Hams, MD   Encounter Date: 10/13/2020   PT End of Session - 10/13/20 1357    Visit Number 2    Number of Visits 12    Date for PT Re-Evaluation 11/11/20    Authorization Type A[employer]na Medicare    PT Start Time 1315    PT Stop Time 1405    PT Time Calculation (min) 50 min    Activity Tolerance Patient tolerated treatment well    Behavior During Therapy [facility] for tasks assessed/performed           Past Medical History:  Diagnosis Date  . Asthma    in cold weather  . Breast cancer ([place]) 09/18/14   left breast  . Breast cancer ([place]) 10/07/14   bx left breast  . Breast cancer of upper-outer quadrant of left female breast ([place]) 09/22/2014  . Cluster headaches    in her 40's  . GERD (gastroesophageal reflux disease)   . Heart murmur    as a child (has outgrown)  . High cholesterol   . Hypertension   . Lower extremity edema   . Microscopic colitis   . Morbid obesity ([place]) 09/30/2019  . OSA on CPAP   . Personal history of radiation therapy   . Pneumonia 2000's X 1  . PONV (postoperative nausea and vomiting)   . Rosacea   . S/P radiation therapy 12/30/14-01/27/15   left breast 50Gy total dose  . 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 Swallowing difficulty   . Venous insufficiency     Past Surgical History:  Procedure Laterality Date  . ABDOMINAL HYSTERECTOMY  1999  . APPENDECTOMY  ~ 2006  . BREAST BIOPSY Left 10/2014  . BREAST LUMPECTOMY Left 2016  . BREAST REDUCTION SURGERY Bilateral 11/11/2014   Procedure: Bilateral Breast Reduction;  Surgeon: Crissie Reese, MD;  Location: [place];  Service: Plastics;  Laterality: Bilateral;  . CARPAL TUNNEL RELEASE Bilateral    2 surgeries  on right, 1 on left  . CESAREAN SECTION  1978; 1980  . COLONOSCOPY    . FOREARM FRACTURE SURGERY Right 2003 X 3  . KNEE ARTHROSCOPY Bilateral    meniscus repair  . PARTIAL MASTECTOMY WITH NEEDLE LOCALIZATION AND AXILLARY SENTINEL LYMPH NODE BX Left 11/11/2014   Procedure: LEFT BREAST PARTIAL MASTECTOMY WITH NEEDLE LOCALIZATION TIMES TWO AND LEFT AXILLARY SENTINEL LYMPH NODE Biopsy;  Surgeon: Fanny Skates, MD;  Location: [place];  Service: General;  Laterality: Left;  . SQUAMOUS CELL CARCINOMA EXCISION  1980's X 1   "nose"  . TONSILLECTOMY  ~ 1957  . TUBAL LIGATION  ?1984    There were no vitals filed for this visit.   Subjective Assessment - 10/13/20 1317    Subjective Pt reports that she is doing well, hasn't had a chance to do exercises as much as she would like d/t travel last week    Diagnostic tests 09/17/20 - shoulder x-rays: L - Mild AC joint degenerative  changes; R - Mild glenohumeral and moderate AC joint degenerative changes    Patient Stated Goals "some direction on movement or motion to do to prevent or alleviate the discomfort"    Currently in Pain? No/denies  [facility] Adult PT Treatment/Exercise - 10/13/20 0001      Exercises   Exercises Shoulder      Shoulder Exercises: Standing   Flexion AROM;Left;10 reps;Limitations    Flexion Limitations with ball on wall    Extension Strengthening;Both;20 reps;Theraband    Theraband Level (Shoulder Extension) Level 2 (Red)    Row Strengthening;Both;20 reps;Theraband    Theraband Level (Shoulder Row) Level 2 (Red)    Other Standing Exercises serratus slide R tband 10x    Other Standing Exercises circles on wall with ball 20x each direction      Shoulder Exercises: ROM/Strengthening   UBE (Upper Arm Bike) L1 3 min fwd/ 71min back      Modalities   Modalities Moist Heat      Moist Heat Therapy   Number Minutes Moist Heat 10 Minutes    Moist Heat Location Cervical      Manual  Therapy   Manual Therapy Soft tissue mobilization;Myofascial release    Manual therapy comments B UT and levator    Soft tissue mobilization TPR to UT and levator                    PT Short Term Goals - 09/30/20 1051      PT SHORT TERM GOAL #1   Title Patient will be independent with initial HEP    Status New    Target Date 10/21/20             PT Long Term Goals - 09/30/20 1051      PT LONG TERM GOAL #1   Title Patient will be independent with ongoing/advanced HEP for self-management at home    Status New    Target Date 11/11/20      PT LONG TERM GOAL #2   Title Improve posture and alignment with patient to demonstrate improved upright posture with posterior shoulder girdle engaged    Status New    Target Date 11/11/20      PT LONG TERM GOAL #3   Title Patient to improve B shoulder AROM to WNL without pain provocation    Status New    Target Date 11/11/20      PT LONG TERM GOAL #4   Title Patient will demonstrate improved b shoulder strength to >/= 4+/5 w/o pain provocation for functional UE use    Status New    Target Date 11/11/20      PT LONG TERM GOAL #5   Title Patient to report ability to perform ADLs, household, and work-related tasks without increased pain    Status New    Target Date 11/11/20                 Plan - 10/13/20 1358    Clinical Impression Statement Pt had a good response to treatment, she declined the need to review home exercises. She had no reports of pain during session. Cueing was given to correctly perform exercises, and to prevent excessive stress on the shoulders. STM given to B UT and levator with TPR, pt responded well reporting feeling better after MT. Educated her on self massage with tennis ball and demonstrated how to do so at home. Ended session with moist heat to increase tissue extensibility post session.    Personal Factors and Comorbidities Time since onset of injury/illness/exacerbation;Past/Current  Experience;Comorbidity 3+    Comorbidities L breast cancer with partial mastectomy 2016, B breast reduction surgery at time of mastectomy, h/o bone spur R shoulder, B  CTR, R forearm fx, B knee scope with meniscal repair, HTN, osteopenia, bilateral BBB, GERD    PT Frequency 2x / week    PT Duration 6 weeks    PT Treatment/Interventions ADLs/Self Care Home Management;Cryotherapy;Electrical Stimulation;Iontophoresis 4mg /ml Dexamethasone;Moist Heat;Functional mobility training;Therapeutic activities;Therapeutic exercise;Neuromuscular re-education;Patient/family education;Manual techniques;Passive range of motion;Dry needling;Taping;Spinal Manipulations    PT Next Visit Plan progress postural stretching and strengthening; manual therapy and modalities PRN to address increased muscle tension/pain    PT Home Exercise Plan MedBridge Access Code: [email or web address] (2/23)    Consulted and Agree with Plan of Care Patient           Patient will benefit from skilled therapeutic intervention in order to improve the following deficits and impairments:  Pain,Postural dysfunction,Improper body mechanics,Increased fascial restricitons,Increased muscle spasms,Impaired flexibility,Decreased range of motion,Decreased strength,Decreased activity tolerance  Visit Diagnosis: Acute pain of left shoulder  Cervicalgia  Abnormal posture  Muscle weakness (generalized)     Problem List Patient Active Problem List   Diagnosis Date Noted  . Metabolic syndrome 14/48/1856  . Mixed hyperlipidemia 04/21/2020  . Polyp of ascending colon 04/21/2020  . History of breast cancer, Tamoxifen, nearing five years, Dr. Griffith Citron 04/21/2020  . Morbid obesity ([place]) 09/30/2019  . Osteopenia 10/02/2018  . Malignant 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, estrogen receptor positive ([place]) 05/29/2017  . 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 Vitamin D deficiency 01/29/2016  . Eczema 12/29/2014  .  Venous insufficiency 10/16/2014  . Essential hypertension 10/01/2014  . Hypercholesteremia 10/01/2014  . GERD (gastroesophageal reflux disease) 10/01/2014  . Other bilateral bundle branch block 06/23/2003    Artist Pais, PTA 10/13/2020, 2:45 PM  [facility] [address]  [place], [place], [address] Phone: [phone]   Fax:  [phone]  Name: Laura Bush MRN: [id number] Date of Birth: 1950-10-17

## 2020-10-14 ENCOUNTER — Ambulatory Visit (INDEPENDENT_AMBULATORY_CARE_PROVIDER_SITE_OTHER): Payer: Medicare HMO | Admitting: Neurology

## 2020-10-14 DIAGNOSIS — Z0100 Encounter for examination of eyes and vision without abnormal findings: Secondary | ICD-10-CM | POA: Diagnosis not present

## 2020-10-14 DIAGNOSIS — Z9989 Dependence on other enabling machines and devices: Secondary | ICD-10-CM

## 2020-10-14 DIAGNOSIS — G4733 Obstructive sleep apnea (adult) (pediatric): Secondary | ICD-10-CM

## 2020-10-15 NOTE — Procedures (Signed)
   [facility] ([facility])  STUDY DATE: 10/14/20  DOB: 12-16-1950  MRN: [id number]  ORDERING CLINICIAN: Star Age, MD, PhD   REFERRING CLINICIAN: Copland, Gay Filler, MD   CLINICAL INFORMATION/HISTORY: 70 year old woman with a history of hypertension, hyperlipidemia, heart murmur, history of cluster headaches, reflux disease, breast cancer, status post radiation therapy and surgery, on tamoxifen, asthma, history of rosacea, history of venous insufficiency and pneumonia, and obesity, who presents for reevaluation of her sleep apnea.  She has been on an AutoPap machine but has an older machine, would qualify for new equipment.  She has been compliant with her AutoPap.    Epworth sleepiness score: 4/24.  BMI: 35.6 kg/m  Neck Circumference: 15.25 "  FINDINGS:   Total Record Time (hours, min): 7 H 40 min  Total Sleep Time (hours, min):  6 H 30 min   Percent REM (%):    11.02 %   Calculated pAHI (per hour): 18.8       REM pAHI: 32.8    NREM pAHI: 17.1 Supine AHI: 22.2   Oxygen Saturation (%) Mean: 94  Minimum oxygen saturation (%):        86    O2 Saturation Range (%): 86-99  O2Saturation (minutes) <=88%: .1 min  Pulse Mean (bpm):    94  Pulse Range (43-100)   IMPRESSION: OSA (obstructive sleep apnea)  RECOMMENDATION:  This home sleep test demonstrates moderate obstructive sleep apnea with a total AHI of 18.8/hour and O2 nadir of 86%.  Snoring was noted and appeared to be in the mild to moderate range.  Ongoing treatment with positive airway pressure is recommended.  The patient will be advised to start treatment with a new AutoPap machine. A full night titration study may be considered to optimize treatment settings, if needed down the road. Please note that untreated obstructive sleep apnea may carry additional perioperative morbidity. Patients with significant obstructive sleep apnea should receive perioperative PAP therapy and the surgeons and  particularly the anesthesiologist should be informed of the diagnosis and the severity of the sleep disordered breathing. The patient should be cautioned not to drive, work at heights, or operate dangerous or heavy equipment when tired or sleepy. Review and reiteration of good sleep hygiene measures should be pursued with any patient. Other causes of the patient's symptoms, including circadian rhythm disturbances, an underlying mood disorder, medication effect and/or an underlying medical problem cannot be ruled out based on this test. Clinical correlation is recommended. The patient and her referring provider will be notified of the test results. The patient will be seen in follow up in sleep clinic at [facility].  I certify that I have reviewed the raw data recording prior to the issuance of this report in accordance with the standards of the American Academy of Sleep Medicine (AASM).  INTERPRETING PHYSICIAN:  Star Age, MD, PhD  Board Certified in Neurology and Sleep Medicine  [facility] [address] [place], [place] [address] [phone]

## 2020-10-15 NOTE — Addendum Note (Signed)
Addended by: Star Age on: 10/15/2020 05:31 PM   Modules accepted: Orders

## 2020-10-15 NOTE — Progress Notes (Signed)
Patient has been on AutoPap therapy with full compliance.  She has an older machine and is eligible for replacement.  She was last seen by Amy L, NP on 09/01/2020.  She had a home sleep test for reevaluation on 10/14/2020.  Please advise patient that her home sleep test confirmed moderate obstructive sleep apnea.  I would like to write for a new AutoPap machine.  Please encourage her to continue using current machine until she is set up with a new machine.  She will need to follow-up in sleep clinic in 31 to 89 days after starting to use the new equipment.  She can be scheduled with me or Amy.  Please encourage her to continue to be fully compliant with treatment.

## 2020-10-16 ENCOUNTER — Ambulatory Visit: Payer: Medicare HMO

## 2020-10-16 ENCOUNTER — Other Ambulatory Visit: Payer: Self-pay

## 2020-10-16 DIAGNOSIS — M542 Cervicalgia: Secondary | ICD-10-CM | POA: Diagnosis not present

## 2020-10-16 DIAGNOSIS — R293 Abnormal posture: Secondary | ICD-10-CM

## 2020-10-16 DIAGNOSIS — M25512 Pain in left shoulder: Secondary | ICD-10-CM

## 2020-10-16 DIAGNOSIS — M6281 Muscle weakness (generalized): Secondary | ICD-10-CM

## 2020-10-16 NOTE — Patient Instructions (Signed)
Access Code: [email or web address] URL: [email or web address] Date: 10/16/2020 Prepared by: Clarene Essex  Exercises Shoulder Internal Rotation with Resistance - 1 x daily - 7 x weekly - 2 sets - 10 reps Shoulder External Rotation with Anchored Resistance - 1 x daily - 7 x weekly - 2 sets - 10 reps

## 2020-10-16 NOTE — Therapy (Signed)
[facility] [address]  [place], [place], [address] Phone: [phone]   Fax:  [phone]  Physical Therapy Treatment  Patient Details  Name: Laura Bush MRN: [id number] Date of Birth: [birth year] Referring Provider (PT): Gregor Hams, MD   Encounter Date: 10/16/2020   PT End of Session - 10/16/20 1150    Visit Number 3    Number of Visits 12    Date for PT Re-Evaluation 11/11/20    Authorization Type Aetna Medicare    PT Start Time 1103    PT Stop Time 1156    PT Time Calculation (min) 53 min    Activity Tolerance Patient tolerated treatment well    Behavior During Therapy [facility] for tasks assessed/performed           Past Medical History:  Diagnosis Date  . Asthma    in cold weather  . Breast cancer ([place]) 09/18/14   left breast  . Breast cancer ([place]) 10/07/14   bx left breast  . Breast cancer of upper-outer quadrant of left female breast ([place]) 09/22/2014  . Cluster headaches    in her 40's  . GERD (gastroesophageal reflux disease)   . Heart murmur    as a child (has outgrown)  . High cholesterol   . Hypertension   . Lower extremity edema   . Microscopic colitis   . Morbid obesity ([place]) 09/30/2019  . OSA on CPAP   . Personal history of radiation therapy   . Pneumonia 2000's X 1  . PONV (postoperative nausea and vomiting)   . Rosacea   . S/P radiation therapy 12/30/14-01/27/15   left breast 50Gy total dose  . 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 Swallowing difficulty   . Venous insufficiency     Past Surgical History:  Procedure Laterality Date  . ABDOMINAL HYSTERECTOMY  1999  . APPENDECTOMY  ~ 2006  . BREAST BIOPSY Left 10/2014  . BREAST LUMPECTOMY Left 2016  . BREAST REDUCTION SURGERY Bilateral 11/11/2014   Procedure: Bilateral Breast Reduction;  Surgeon: Crissie Reese, MD;  Location: [place];  Service: Plastics;  Laterality: Bilateral;  . CARPAL TUNNEL RELEASE Bilateral    2  surgeries on right, 1 on left  . CESAREAN SECTION  1978; 1980  . COLONOSCOPY    . FOREARM FRACTURE SURGERY Right 2003 X 3  . KNEE ARTHROSCOPY Bilateral    meniscus repair  . PARTIAL MASTECTOMY WITH NEEDLE LOCALIZATION AND AXILLARY SENTINEL LYMPH NODE BX Left 11/11/2014   Procedure: LEFT BREAST PARTIAL MASTECTOMY WITH NEEDLE LOCALIZATION TIMES TWO AND LEFT AXILLARY SENTINEL LYMPH NODE Biopsy;  Surgeon: Fanny Skates, MD;  Location: [place];  Service: General;  Laterality: Left;  . SQUAMOUS CELL CARCINOMA EXCISION  1980's X 1   "nose"  . TONSILLECTOMY  ~ 1957  . TUBAL LIGATION  ?1984    There were no vitals filed for this visit.   Subjective Assessment - 10/16/20 1106    Subjective Pt reports that she was really tight in her pecs while doing the stretches this morning.    Diagnostic tests 09/17/20 - shoulder x-rays: L - Mild AC joint degenerative  changes; R - Mild glenohumeral and moderate AC joint degenerative changes    Patient Stated Goals "some direction on movement or motion to do to prevent or alleviate the discomfort"    Currently in Pain? No/denies  [facility] Adult PT Treatment/Exercise - 10/16/20 0001      Exercises   Exercises Shoulder      Shoulder Exercises: Standing   External Rotation Strengthening;Left;20 reps;Theraband    Theraband Level (Shoulder External Rotation) Level 2 (Red)    Internal Rotation Strengthening;Left;20 reps;Theraband    Theraband Level (Shoulder Internal Rotation) Level 2 (Red)    Extension Strengthening;Both;20 reps;Theraband    Theraband Level (Shoulder Extension) Level 3 (Green)    Row Strengthening;Both;20 reps;Theraband    Theraband Level (Shoulder Row) Level 3 (Green)    Other Standing Exercises shoulder shrug 2# 10 reps      Shoulder Exercises: ROM/Strengthening   UBE (Upper Arm Bike) L1.5 3 min fwd/ 37min back      Shoulder Exercises: Stretch   Other Shoulder Stretches B pec doorway stretch 3x30  sec      Modalities   Modalities Moist Heat      Moist Heat Therapy   Number Minutes Moist Heat 10 Minutes    Moist Heat Location Cervical      Manual Therapy   Manual Therapy Soft tissue mobilization;Myofascial release    Manual therapy comments L UT and levator; R rhomboid    Soft tissue mobilization TPR to UT, levator, and rhomboids                  PT Education - 10/16/20 1148    Education Details HEP update Access Code: [email or web address]    Person(s) Educated Patient    Methods Explanation;Demonstration;Tactile cues;Handout    Comprehension Verbalized understanding;Returned demonstration;Verbal cues required;Need further instruction;Tactile cues required            PT Short Term Goals - 09/30/20 1051      PT SHORT TERM GOAL #1   Title Patient will be independent with initial HEP    Status New    Target Date 10/21/20             PT Long Term Goals - 09/30/20 1051      PT LONG TERM GOAL #1   Title Patient will be independent with ongoing/advanced HEP for self-management at home    Status New    Target Date 11/11/20      PT LONG TERM GOAL #2   Title Improve posture and alignment with patient to demonstrate improved upright posture with posterior shoulder girdle engaged    Status New    Target Date 11/11/20      PT LONG TERM GOAL #3   Title Patient to improve B shoulder AROM to WNL without pain provocation    Status New    Target Date 11/11/20      PT LONG TERM GOAL #4   Title Patient will demonstrate improved b shoulder strength to >/= 4+/5 w/o pain provocation for functional UE use    Status New    Target Date 11/11/20      PT LONG TERM GOAL #5   Title Patient to report ability to perform ADLs, household, and work-related tasks without increased pain    Status New    Target Date 11/11/20                 Plan - 10/16/20 1151    Clinical Impression Statement Pt responded well to treatment, she noted tightness in her L pec when doing her  stretches earlier. Did some stretching with her which helped it to loosen it up some. Cont'd with scap stability exercises for postural reeducation and scap strengthening. Cues were  required to keep elbows tucked to side during ER/IR, pt then demonstrated good performance. Added ER/IR to HEP with pt verbalizing and demonstrating understanding. She still is tight in her upper shoulders, she reported that the massage last session helped but still has a some tightness in those areas. Ended session with moist heat to B shoulders and neck to increase tissue extensibility.    Personal Factors and Comorbidities Time since onset of injury/illness/exacerbation;Past/Current Experience;Comorbidity 3+    Comorbidities L breast cancer with partial mastectomy 2016, B breast reduction surgery at time of mastectomy, h/o bone spur R shoulder, B CTR, R forearm fx, B knee scope with meniscal repair, HTN, osteopenia, bilateral BBB, GERD    PT Frequency 2x / week    PT Duration 6 weeks    PT Treatment/Interventions ADLs/Self Care Home Management;Cryotherapy;Electrical Stimulation;Iontophoresis 4mg /ml Dexamethasone;Moist Heat;Functional mobility training;Therapeutic activities;Therapeutic exercise;Neuromuscular re-education;Patient/family education;Manual techniques;Passive range of motion;Dry needling;Taping;Spinal Manipulations    PT Next Visit Plan progress postural stretching and strengthening; manual therapy and modalities PRN to address increased muscle tension/pain    PT Home Exercise Plan MedBridge Access Code: [email or web address] (2/23), update (3/11); Access Code: [email or web address]    Consulted and Agree with Plan of Care Patient           Patient will benefit from skilled therapeutic intervention in order to improve the following deficits and impairments:  Pain,Postural dysfunction,Improper body mechanics,Increased fascial restricitons,Increased muscle spasms,Impaired flexibility,Decreased range of motion,Decreased  strength,Decreased activity tolerance  Visit Diagnosis: Acute pain of left shoulder  Cervicalgia  Abnormal posture  Muscle weakness (generalized)     Problem List Patient Active Problem List   Diagnosis Date Noted  . Metabolic syndrome 83/41/9622  . Mixed hyperlipidemia 04/21/2020  . Polyp of ascending colon 04/21/2020  . History of breast cancer, Tamoxifen, nearing five years, Dr. Griffith Citron 04/21/2020  . Morbid obesity ([place]) 09/30/2019  . Osteopenia 10/02/2018  . Malignant 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, estrogen receptor positive ([place]) 05/29/2017  . 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 Vitamin D deficiency 01/29/2016  . Eczema 12/29/2014  . Venous insufficiency 10/16/2014  . Essential hypertension 10/01/2014  . Hypercholesteremia 10/01/2014  . GERD (gastroesophageal reflux disease) 10/01/2014  . Other bilateral bundle branch block 06/23/2003    Artist Pais, PTA 10/16/2020, 12:06 PM  [facility] [address]  [place], [place], [address] Phone: [phone]   Fax:  [phone]  Name: Laura Bush MRN: [id number] Date of Birth: 09/15/1950

## 2020-10-19 ENCOUNTER — Telehealth (INDEPENDENT_AMBULATORY_CARE_PROVIDER_SITE_OTHER): Payer: Medicare HMO | Admitting: Family Medicine

## 2020-10-19 ENCOUNTER — Encounter (INDEPENDENT_AMBULATORY_CARE_PROVIDER_SITE_OTHER): Payer: Self-pay | Admitting: Family Medicine

## 2020-10-19 ENCOUNTER — Telehealth: Payer: Self-pay

## 2020-10-19 ENCOUNTER — Other Ambulatory Visit: Payer: Self-pay

## 2020-10-19 DIAGNOSIS — I10 Essential (primary) hypertension: Secondary | ICD-10-CM | POA: Diagnosis not present

## 2020-10-19 DIAGNOSIS — E8881 Metabolic syndrome: Secondary | ICD-10-CM | POA: Diagnosis not present

## 2020-10-19 DIAGNOSIS — E669 Obesity, unspecified: Secondary | ICD-10-CM | POA: Diagnosis not present

## 2020-10-19 DIAGNOSIS — M25511 Pain in right shoulder: Secondary | ICD-10-CM | POA: Diagnosis not present

## 2020-10-19 DIAGNOSIS — Z6834 Body mass index (BMI) 34.0-34.9, adult: Secondary | ICD-10-CM | POA: Diagnosis not present

## 2020-10-19 DIAGNOSIS — E7849 Other hyperlipidemia: Secondary | ICD-10-CM

## 2020-10-19 DIAGNOSIS — J069 Acute upper respiratory infection, unspecified: Secondary | ICD-10-CM

## 2020-10-19 DIAGNOSIS — M25512 Pain in left shoulder: Secondary | ICD-10-CM

## 2020-10-19 NOTE — Telephone Encounter (Signed)
I called pt and advised of results. Pt verbalized understanding and is agreeable to using [employer] for local DME. Pt understands there is a shortage of machines right now and start dates can be 6-12 weeks out.  Pt understands insurance will require 4 hours of use every night for compliance, she also understands we need to see her back 31-90 days after starting the machine.  Pt will call back once she as started the machine and we will facilitate f/u appt.  I have sent order to [employer] with confirmation of order being received.

## 2020-10-19 NOTE — Telephone Encounter (Signed)
-----   Message from Star Age, MD sent at 10/15/2020  5:31 PM EST ----- Patient has been on AutoPap therapy with full compliance.  She has an older machine and is eligible for replacement.  She was last seen by Amy L, NP on 09/01/2020.  She had a home sleep test for reevaluation on 10/14/2020.  Please advise patient that her home sleep test confirmed moderate obstructive sleep apnea.  I would like to write for a new AutoPap machine.  Please encourage her to continue using current machine until she is set up with a new machine.  She will need to follow-up in sleep clinic in 31 to 89 days after starting to use the new equipment.  She can be scheduled with me or Amy.  Please encourage her to continue to be fully compliant with treatment.

## 2020-10-20 ENCOUNTER — Ambulatory Visit: Payer: Medicare HMO

## 2020-10-22 ENCOUNTER — Encounter: Payer: Self-pay | Admitting: Family Medicine

## 2020-10-23 ENCOUNTER — Other Ambulatory Visit: Payer: Self-pay

## 2020-10-23 ENCOUNTER — Encounter: Payer: Self-pay | Admitting: Internal Medicine

## 2020-10-23 ENCOUNTER — Telehealth: Payer: Self-pay

## 2020-10-23 ENCOUNTER — Telehealth (INDEPENDENT_AMBULATORY_CARE_PROVIDER_SITE_OTHER): Payer: Medicare HMO | Admitting: Internal Medicine

## 2020-10-23 ENCOUNTER — Encounter: Payer: Medicare HMO | Admitting: Physical Therapy

## 2020-10-23 VITALS — BP 128/81 | HR 62 | Temp 98.8°F | Wt 170.0 lb

## 2020-10-23 DIAGNOSIS — J019 Acute sinusitis, unspecified: Secondary | ICD-10-CM

## 2020-10-23 MED ORDER — AZELASTINE HCL 0.1 % NA SOLN: 2.0000 | Freq: Two times a day (BID) | NASAL | 0 refills | Status: DC

## 2020-10-23 MED ORDER — AMOXICILLIN 875 MG PO TABS: 875.0000 mg | ORAL_TABLET | Freq: Two times a day (BID) | ORAL | 0 refills | Status: DC

## 2020-10-23 NOTE — Progress Notes (Signed)
Subjective:    Patient ID: Laura Bush, female    DOB: 07-13-1951, 70 y.o.   MRN: [id number]  DOS:  10/23/2020 Type of visit - description: Virtual Visit via Video Note  I connected with the above patient  by a video enabled telemedicine application and verified that I am speaking with the correct person using two identifiers.   THIS ENCOUNTER IS A VIRTUAL VISIT DUE TO COVID-19 - PATIENT WAS NOT SEEN IN THE OFFICE. PATIENT HAS CONSENTED TO VIRTUAL VISIT / TELEMEDICINE VISIT   Location of patient: home  Location of provider: office  Persons participating in the virtual visit: patient, provider   I discussed the limitations of evaluation and management by telemedicine and the availability of in person appointments. The patient expressed understanding and agreed to proceed.  Acute Symptoms a started 5 days ago: Sinus congestion, cough, on and off fever, T-max 100. The sinus congestion for the last 2 days is somewhat worse and is felt in both side of the face.  She had 2 Covid test at home, both negative.  Denies any chest pain, difficulty breathing or wheezing No nausea or vomiting Mild decrease on her sense of smell and taste   Review of Systems See above   Past Medical History:  Diagnosis Date  . Asthma    in cold weather  . Breast cancer ([place]) 09/18/14   left breast  . Breast cancer ([place]) 10/07/14   bx left breast  . Breast cancer of upper-outer quadrant of left female breast ([place]) 09/22/2014  . Cluster headaches    in her 40's  . GERD (gastroesophageal reflux disease)   . Heart murmur    as a child (has outgrown)  . High cholesterol   . Hypertension   . Lower extremity edema   . Microscopic colitis   . Morbid obesity ([place]) 09/30/2019  . OSA on CPAP   . Personal history of radiation therapy   . Pneumonia 2000's X 1  . PONV (postoperative nausea and vomiting)   . Rosacea   . S/P radiation therapy 12/30/14-01/27/15   left breast 50Gy total dose  . 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 Swallowing difficulty   . Venous insufficiency     Past Surgical History:  Procedure Laterality Date  . ABDOMINAL HYSTERECTOMY  1999  . APPENDECTOMY  ~ 2006  . BREAST BIOPSY Left 10/2014  . BREAST LUMPECTOMY Left 2016  . BREAST REDUCTION SURGERY Bilateral 11/11/2014   Procedure: Bilateral Breast Reduction;  Surgeon: Crissie Reese, MD;  Location: [facility];  Service: Plastics;  Laterality: Bilateral;  . CARPAL TUNNEL RELEASE Bilateral    2 surgeries on right, 1 on left  . CESAREAN SECTION  1978; 1980  . COLONOSCOPY    . FOREARM FRACTURE SURGERY Right 2003 X 3  . KNEE ARTHROSCOPY Bilateral    meniscus repair  . PARTIAL MASTECTOMY WITH NEEDLE LOCALIZATION AND AXILLARY SENTINEL LYMPH NODE BX Left 11/11/2014   Procedure: LEFT BREAST PARTIAL MASTECTOMY WITH NEEDLE LOCALIZATION TIMES TWO AND LEFT AXILLARY SENTINEL LYMPH NODE Biopsy;  Surgeon: Fanny Skates, MD;  Location: [facility];  Service: General;  Laterality: Left;  . SQUAMOUS CELL CARCINOMA EXCISION  1980's X 1   "nose"  . TONSILLECTOMY  ~ 1957  . TUBAL LIGATION  ?1984    Allergies as of 10/23/2020      Reactions   Amlodipine Swelling   Diclofenac Hypertension   Lanolin    Tape Other (See Comments)  Redness (Bandaids also)      Medication List       Accurate as of October 23, 2020  3:06 PM. If you have any questions, ask your nurse or doctor.        ALLEGRA PO Take by mouth.   aMILoride 5 MG tablet Commonly known as: MIDAMOR Take 1 tablet (5 mg total) by mouth daily.   B-complex with vitamin C tablet Take 1 tablet by mouth daily.   CALCIUM CARBONATE-VITAMIN D PO Take 1 tablet by mouth 2 (two) times daily. 600mg  calcium, D3 (1000)IU   carvedilol 6.25 MG tablet Commonly known as: COREG Take 6.25 mg by mouth 2 (two) times daily with a meal.   Elderberry 575 MG/5ML Syrp Take by mouth daily.   hydrochlorothiazide 12.5 MG capsule Commonly known as: MICROZIDE Take 1 capsule (12.5 mg total) by  mouth daily.   metFORMIN 500 MG tablet Commonly known as: GLUCOPHAGE Take 1 tablet (500 mg total) by mouth 2 (two) times daily.   pantoprazole 40 MG tablet Commonly known as: PROTONIX Take 1 tablet (40 mg total) by mouth daily.   simvastatin 20 MG tablet Commonly known as: ZOCOR Take 1 tablet (20 mg total) by mouth daily.   telmisartan 80 MG tablet Commonly known as: MICARDIS Take 1 tablet (80 mg total) by mouth daily.          Objective:   Physical Exam BP 128/81 (BP Location: Right Arm, Patient Position: Sitting, Cuff Size: Large)   Pulse 62   Temp 98.8 F (37.1 C) (Oral)   Wt 170 lb (77.1 kg)   BMI 34.34 kg/m  This is a virtual video visit, alert oriented x3, in no distress, speaking in complete sentences, no cough noted.     Assessment    70 year old female, PMH includes HTN, DM, high cholesterol, history of breast cancer, presents with:  Sinusitis: Symptoms started 5 days ago, they are consistent with sinusitis. She is triple vaccinated against COVID, had 2 Covid test negative at home, my suspicion for this being a viral syndrome is low. Plan: Treat with amoxicillin, continue Mucinex and Delsym, Astelin for nasal congestion. She has a PCR Covid test pending, if it is +, recommend to call this office noting that today is the fifth date of illness she will not qualify for aggressive IV or oral Covid therapy.  (Unless it is started today). Detailed message sent with instruction      I discussed the assessment and treatment plan with the patient. The patient was provided an opportunity to ask questions and all were answered. The patient agreed with the plan and demonstrated an understanding of the instructions.   The patient was advised to call back or seek an in-person evaluation if the symptoms worsen or if the condition fails to improve as anticipated.

## 2020-10-23 NOTE — Telephone Encounter (Signed)
PT thinks she has a sinus infection and asking for a appt for today, has had a fever, congestion, sore throat, cough and fatigue. Declined triage.    Appt scheduled w. Dr. Larose Kells today.

## 2020-10-26 NOTE — Progress Notes (Signed)
TeleHealth Visit:  Due to the COVID-19 pandemic, this visit was completed with telemedicine (audio/video) technology to reduce patient and provider exposure as well as to preserve personal protective equipment.   Laura Bush has verbally consented to this TeleHealth visit. The patient is located at home, the provider is located at the [facility] office. The participants in this visit include the listed provider and patient and. The visit was conducted today via MyChart video.  OBESITY Laura Bush is here to discuss her progress with her obesity treatment plan along with follow-up of her obesity related diagnoses.   Today's visit was #: 14 Starting weight: 196 lbs Starting date: 10/17/2019 Today's date:  10/19/2020  Interim History:  Laura Bush says that PT is helping her bilateral shoulder pain/tightness.  She visited her daughter last week.  She says she started having URI signs and symptoms yesterday.  Current Meal Plan: the [employer] with chicken.  Current Exercise Plan: Yoga and PT for 45-60 minutes 2-3 times per week.  Assessment/Plan:   1. Essential hypertension At goal. Medications: Midamor 5 mg daily, Coreg 6.25 mg twice daily, HCTZ 12.5 mg daily, Micardis 80 mg daily.   Plan: Avoid buying foods that are: processed, frozen, or prepackaged to avoid excess salt. We will continue to monitor closely alongside her PCP and/or Specialist.  Regular follow up with PCP and specialists was also encouraged.   BP Readings from Last 3 Encounters:  10/23/20 128/81  10/05/20 128/82  09/24/20 132/86   Lab Results  Component Value Date   CREATININE 0.72 10/05/2020   2. Other hyperlipidemia Course: Improving, but not optimized. Lipid-lowering medications: Zocor 20 mg daily.   Plan: Dietary changes: Increase soluble fiber, decrease simple carbohydrates, decrease saturated fat. Exercise changes: Moderate to vigorous-intensity aerobic activity 150 minutes per week or as  tolerated. We will continue to monitor along with PCP/specialists as it pertains to her weight loss journey.  Lab Results  Component Value Date   CHOL 156 10/05/2020   HDL 54.30 10/05/2020   LDLCALC 66 10/05/2020   TRIG 182.0 (H) 10/05/2020   CHOLHDL 3 10/05/2020   Lab Results  Component Value Date   ALT 21 06/16/2020   AST 19 06/16/2020   ALKPHOS 34 (L) 06/16/2020   BILITOT 0.4 06/16/2020   The 10-year ASCVD risk score Mikey Bussing DC Jr., et al., 2013) is: 19.3%   Values used to calculate the score:     Age: 70 years     Sex: Female     Is Non-Hispanic African American: No     Diabetic: Yes     Tobacco smoker: No     Systolic Blood Pressure: 144 mmHg     Is BP treated: Yes     HDL Cholesterol: 54.3 mg/dL     Total Cholesterol: 156 mg/dL  3. Insulin resistance Not at goal. Goal is HgbA1c < 5.7, fasting insulin closer to 5.  Medication: metformin 500 mg twice daily.    Plan:  She will continue to focus on protein-rich, low simple carbohydrate foods. We reviewed the importance of hydration, regular exercise for stress reduction, and restorative sleep.   Lab Results  Component Value Date   HGBA1C 5.2 04/06/2020   Lab Results  Component Value Date   INSULIN 12.6 10/17/2019   4. Metabolic syndrome Starting goal: Lose 7-10% of starting weight. She will continue to focus on protein-rich, low simple carbohydrate foods. We reviewed the importance of hydration, regular exercise for stress reduction, and restorative  sleep.  We will continue to check lab work every 3 months, with 10% weight loss, or should any other concerns arise.  5. Bilateral shoulder pain She has been going to PT and says it is helping her shoulder pain. We will continue to monitor symptoms as they relate to her weight loss journey.  6. Upper respiratory tract infection, unspecified type She began having URI symptoms yesterday, so she is doing a video visit today.  7. Class 1 obesity with serious comorbidity and  body mass index (BMI) of 34.0 to 34.9 in adult, unspecified obesity type  Course: Laura Bush is currently in the action stage of change. As such, her goal is to continue with weight loss efforts.   Nutrition goals: She has agreed to the [employer] with chicken.   Exercise goals: As is.  Behavioral modification strategies: increasing lean protein intake, decreasing simple carbohydrates, increasing vegetables and increasing water intake.  Stay hydrated during illness.  Laura Bush has agreed to follow-up with our clinic in 3 weeks. She was informed of the importance of frequent follow-up visits to maximize her success with intensive lifestyle modifications for her multiple health conditions.   Objective:   General: Cooperative, alert, well developed, in no acute distress. HEENT: Conjunctivae and lids unremarkable. Cardiovascular: Regular rhythm.  Lungs: Normal work of breathing. Neurologic: No focal deficits.   Lab Results  Component Value Date   CREATININE 0.72 10/05/2020   BUN 19 10/05/2020   NA 141 10/05/2020   K 4.5 10/05/2020   CL 104 10/05/2020   CO2 30 10/05/2020   Lab Results  Component Value Date   ALT 21 06/16/2020   AST 19 06/16/2020   ALKPHOS 34 (L) 06/16/2020   BILITOT 0.4 06/16/2020   Lab Results  Component Value Date   HGBA1C 5.2 04/06/2020   HGBA1C 5.6 04/03/2019   HGBA1C 5.6 03/28/2018   Lab Results  Component Value Date   INSULIN 12.6 10/17/2019   Lab Results  Component Value Date   TSH 0.88 10/05/2020   Lab Results  Component Value Date   CHOL 156 10/05/2020   HDL 54.30 10/05/2020   LDLCALC 66 10/05/2020   TRIG 182.0 (H) 10/05/2020   CHOLHDL 3 10/05/2020   Lab Results  Component Value Date   WBC 5.0 06/16/2020   HGB 12.5 06/16/2020   HCT 37.1 06/16/2020   MCV 97.4 06/16/2020   PLT 178 06/16/2020   Lab Results  Component Value Date   IRON 120 10/17/2019   TIBC 295 10/17/2019   FERRITIN 206 (H) 12/12/2019   Attestation Statements:    Reviewed by clinician on day of visit: allergies, medications, problem list, medical history, surgical history, family history, social history, and previous encounter notes.  I, Water quality scientist, CMA, am acting as transcriptionist for Briscoe Deutscher, DO  I have reviewed the above documentation for accuracy and completeness, and I agree with the above. Briscoe Deutscher, DO

## 2020-10-27 ENCOUNTER — Ambulatory Visit: Payer: Medicare HMO

## 2020-10-28 ENCOUNTER — Ambulatory Visit (INDEPENDENT_AMBULATORY_CARE_PROVIDER_SITE_OTHER): Payer: Medicare HMO | Admitting: Pharmacist

## 2020-10-28 DIAGNOSIS — E7800 Pure hypercholesterolemia, unspecified: Secondary | ICD-10-CM

## 2020-10-28 DIAGNOSIS — E8881 Metabolic syndrome: Secondary | ICD-10-CM

## 2020-10-28 DIAGNOSIS — I10 Essential (primary) hypertension: Secondary | ICD-10-CM

## 2020-10-28 DIAGNOSIS — M8580 Other specified disorders of bone density and structure, unspecified site: Secondary | ICD-10-CM

## 2020-10-28 NOTE — Progress Notes (Signed)
   I, Laura Bush, LAT, ATC, am serving as scribe for Dr. Lynne Leader.  Laura Bush is a 70 y.o. female who presents to [facility] today for f/u of B shoulder pain, L>R.  She was last seen by Dr. Georgina Snell on 09/17/20 and was referred to PT at the [facility] in [facility] of which she's completed 5 sessions.  Since her last visit w/ Dr. Georgina Snell, pt reports that she was doing well and noticing improvement w/ PT until she got sick x the last 10 days and has been home.  She rates her improvement at 30-40% but feels like she will con't to improve once she's able to return to PT.  She has been taking Tylenol arthritis.  Diagnostic imaging: R and L shoulder and C-spine XR- 09/17/20  Pertinent review of systems: No fevers or chills  Relevant historical information: History of breast cancer   Exam:  BP 104/74 (BP Location: Right Arm, Patient Position: Sitting, Cuff Size: Normal)   Ht 4\' 11"  (1.499 m)   Wt 174 lb 9.6 oz (79.2 kg)   BMI 35.26 kg/m  General: Well Developed, well nourished, and in no acute distress.   MSK: Shoulders bilaterally normal-appearing normal motion nontender.  Intact strength.    Assessment and Plan: 70 y.o. female with bilateral shoulder pain improving with physical therapy but not resolved.  Plan to continue PT and home exercise program.  Recheck back with me as needed.  Discussed precautions backup plan and potential next steps.   Discussed warning signs or symptoms. Please see discharge instructions. Patient expresses understanding.   The above documentation has been reviewed and is accurate and complete Lynne Leader, M.D.  Total encounter time 20 minutes including face-to-face time with the patient and, reviewing past medical record, and charting on the date of service.

## 2020-10-28 NOTE — Progress Notes (Deleted)
Chronic Care Management Pharmacy Note  10/28/2020 Name:  Laura Bush MRN:  [id number] DOB:  25-Dec-1950  Subjective: Laura Bush is an 70 y.o. year old female who is a primary patient of Copland, Gay Filler, MD.  The CCM team was consulted for assistance with disease management and care coordination needs.    Engaged with patient by telephone for follow up visit in response to provider referral for pharmacy case management and/or care coordination services.   Consent to Services:  The patient was given information about Chronic Care Management services, agreed to services, and gave verbal consent prior to initiation of services.  Please see initial visit note for detailed documentation.   Patient Care Team: Copland, Gay Filler, MD as PCP - General (Family Medicine) Skeet Latch, MD as Attending Physician (Cardiology) Day, Melvenia Beam, [facility] (Inactive) (Pharmacist)  Recent office visits: 10/23/2020 Video Visit with Dr Larose Kells for acute sinusitis; COVID test negative (done at [facility]). Prescribed amoxicillin x 7 days and Astelin NS for nasal congestion . Instructed to continue Mucinex and Delsym.  Recent consult visits: ***  Hospital visits: {Hospital DC Yes/No:21091515}  Objective:  Lab Results  Component Value Date   CREATININE 0.72 10/05/2020   CREATININE 0.91 06/16/2020   CREATININE 0.81 04/06/2020    Lab Results  Component Value Date   HGBA1C 5.2 04/06/2020   Last diabetic Eye exam: No results found for: HMDIABEYEEXA  Last diabetic Foot exam: No results found for: HMDIABFOOTEX      Component Value Date/Time   CHOL 156 10/05/2020 1059   TRIG 182.0 (H) 10/05/2020 1059   HDL 54.30 10/05/2020 1059   CHOLHDL 3 10/05/2020 1059   VLDL 36.4 10/05/2020 1059   LDLCALC 66 10/05/2020 1059   LDLCALC 70 04/06/2020 1059    Hepatic Function Latest Ref Rng & Units 06/16/2020 10/03/2019 04/03/2019  Total Protein 6.5 - 8.1 g/dL 6.8 6.6 6.4  Albumin 3.5 -  5.0 g/dL 3.9 3.8 4.2  AST 15 - 41 U/L _0 ALT 0 - 44 U/L 21 36 23  Alk Phosphatase 38 - 126 U/L 34(L) 39 31(L)  Total Bilirubin 0.3 - 1.2 mg/dL 0.4 0.4 0.5    Lab Results  Component Value Date/Time   TSH 0.88 10/05/2020 10:59 AM   TSH 1.370 10/17/2019 12:06 PM   FREET4 1.00 10/17/2019 12:06 PM    CBC Latest Ref Rng & Units 06/16/2020 12/12/2019 10/17/2019  WBC 4.0 - 10.5 K/uL 5.0 5.7 -  Hemoglobin 12.0 - 15.0 g/dL 12.5 13.0 -  Hematocrit 36.0 - 46.0 % 37.1 38.9 40.9  Platelets 150 - 400 K/uL 178 170 -    Lab Results  Component Value Date/Time   VD25OH 44.11 10/05/2020 10:59 AM   VD25OH 38.52 04/03/2019 10:37 AM    Clinical ASCVD: {YES/NO:21197} The 10-year ASCVD risk score Mikey Bussing DC Jr., et al., 2013) is: 19.3%   Values used to calculate the score:     Age: 14 years     Sex: Female     Is Non-Hispanic African American: No     Diabetic: Yes     Tobacco smoker: No     Systolic Blood Pressure: 709 mmHg     Is BP treated: Yes     HDL Cholesterol: 54.3 mg/dL     Total Cholesterol: 156 mg/dL    Other: (CHADS2VASc if Afib, PHQ9 if depression, MMRC or CAT for COPD, ACT, DEXA)  Social History   Tobacco Use  Smoking Status  Never Smoker  Smokeless Tobacco Never Used   BP Readings from Last 3 Encounters:  10/23/20 128/81  10/05/20 128/82  09/24/20 132/86   Pulse Readings from Last 3 Encounters:  10/23/20 62  10/05/20 67  09/24/20 65   Wt Readings from Last 3 Encounters:  10/23/20 170 lb (77.1 kg)  10/05/20 175 lb (79.4 kg)  09/24/20 174 lb (78.9 kg)    Assessment: Review of patient past medical history, allergies, medications, health status, including review of consultants reports, laboratory and other test data, was performed as part of comprehensive evaluation and provision of chronic care management services.   SDOH:  (Social Determinants of Health) assessments and interventions performed:    CCM Care Plan  Allergies  Allergen Reactions  . Amlodipine  Swelling  . Diclofenac Hypertension  . Lanolin   . Tape Other (See Comments)    Redness (Bandaids also)    Medications Reviewed Today    Reviewed by Tora Kindred, CMA (Certified Medical Assistant) on 10/23/20 at 1447  Med List Status: <None>  Medication Order Taking? Sig Documenting Provider Last Dose Status Informant  aMILoride (MIDAMOR) 5 MG tablet [id number] Yes Take 1 tablet (5 mg total) by mouth daily. Copland, Gay Filler, MD Taking Active   B Complex-C (B-COMPLEX WITH VITAMIN C) tablet [id number] Yes Take 1 tablet by mouth daily. [provider] Taking Active   CALCIUM CARBONATE-VITAMIN D PO [id number] Yes Take 1 tablet by mouth 2 (two) times daily. 614m calcium, D3 (1000)IU [provider] Taking Active   carvedilol (COREG) 6.25 MG tablet [id number] Take 6.25 mg by mouth 2 (two) times daily with a meal. [provider] Taking Active   Elderberry 575 MG/5ML SYRP [id number] Take by mouth daily. [provider] Taking Active   Fexofenadine HCl (ALLEGRA PO) [id number] Take by mouth. [provider] Taking Active   hydrochlorothiazide (MICROZIDE) 12.5 MG capsule [id number] Take 1 capsule (12.5 mg total) by mouth daily. Copland, JGay Filler MD Taking Active   metFORMIN (GLUCOPHAGE) 500 MG tablet [id number] Take 1 tablet (500 mg total) by mouth 2 (two) times daily. WBriscoe Deutscher DO Taking Active   pantoprazole (PROTONIX) 40 MG tablet [id number] Take 1 tablet (40 mg total) by mouth daily. PIrene Shipper MD Taking Active   simvastatin (ZOCOR) 20 MG tablet [id number] Take 1 tablet (20 mg total) by mouth daily. Copland, JGay Filler MD Taking Active   telmisartan (MICARDIS) 80 MG tablet [id number] Take 1 tablet (80 mg total) by mouth daily. Copland, JGay Filler MD Taking Active           Patient Active Problem List   Diagnosis Date Noted  . Metabolic syndrome 035/70/1779 . Mixed hyperlipidemia 04/21/2020  . Polyp of  ascending colon 04/21/2020  . History of breast cancer, Tamoxifen, nearing five years, Dr. MGriffith Citron09/14/2021  . Morbid obesity ([place] 09/30/2019  . Osteopenia 10/02/2018  . Malignant 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, estrogen receptor positive ([place] 05/29/2017  . 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 Vitamin D deficiency 01/29/2016  . Eczema 12/29/2014  . Venous insufficiency 10/16/2014  . Essential hypertension 10/01/2014  . Hypercholesteremia 10/01/2014  . GERD (gastroesophageal reflux disease) 10/01/2014  . Other bilateral bundle branch block 06/23/2003    Immunization History  Administered Date(s) Administered  . Fluad Quad(high Dose 65+) 04/03/2019, 04/29/2020  . Hepatitis A 08/24/1995, 12/23/1996  . Hepatitis B 12/23/1996, 02/14/1997, 05/24/1997, 06/04/1998  .  IPV 08/24/1995  . Influenza Split 04/06/2017  . Influenza, High Dose Seasonal PF 05/10/2016, 04/06/2017  . Influenza,inj,Quad PF,6+ Mos 04/30/2015  . Influenza-Unspecified 07/07/2005, 06/08/2006, 05/28/2007, 04/23/2008, 05/23/2011, 05/14/2012, 05/20/2013, 04/12/2018, 04/03/2019  . Meningococcal Conjugate 05/07/2008  . Meningococcal Mcv4o 05/07/2008  . PFIZER(Purple Top)SARS-COV-2 Vaccination 09/13/2019, 10/08/2019, 05/21/2020  . Pneumococcal Conjugate-13 03/22/2017  . Pneumococcal Polysaccharide-23 06/08/2006, 03/28/2018  . Tdap 09/19/2011  . Typhoid Inactivated 05/07/2008  . Yellow Fever 05/08/2008  . Zoster 06/21/2013  . Zoster Recombinat (Shingrix) 10/06/2018, 01/18/2019    Conditions to be addressed/monitored: {CCM ASSESSMENT DISEASE OPTIONS:25047}  There are no care plans that you recently modified to display for this patient.   Medication Assistance: {MEDASSISTANCEINFO:25044}  Patient's preferred pharmacy is:  [place], [address] [place] [address] Phone: [phone] Fax:  [facility] [place], [address] [place] [address] Phone: [phone] Fax: [phone]  [id number] [id number]: [phone]: [phone] Uses pill box? {Yes or If no, why not?:20788} Pt endorses ***% compliance  Follow Up:  {FOLLOWUP:24991}  Plan: {CM FOLLOW UP PLAN:25073}  SIG***

## 2020-10-28 NOTE — Chronic Care Management (AMB) (Signed)
  Chronic Care Management Pharmacy Note  10/28/2020 Name:  Laura Bush MRN:  [id number] DOB:  04/27/1951  Subjective: Laura Bush is an 69 y.o. year old female who is a primary patient of Copland, Jessica C, MD.  The CCM team was consulted for assistance with disease management and care coordination needs.   Patient lives with her husband, Laura Bush. She is retired. Originally from [place].    Engaged with patient by telephone for follow up visit in response to provider referral for pharmacy case management and/or care coordination services.   Consent to Services:  The patient was given information about Chronic Care Management services, agreed to services, and gave verbal consent prior to initiation of services.  Please see initial visit note for detailed documentation.   Patient Care Team: Copland, Jessica C, MD as PCP - General (Family Medicine) Smithton, Tiffany, MD as Attending Physician (Cardiology) Day, Kanesha, RPH (Inactive) (Pharmacist)  Recent office visits: 10/23/2020 Video Visit with Dr Paz for acute sinusitis; COVID test negative (done at [facility]). Prescribed amoxicillin x 7 days and Astelin NS for nasal congestion . Instructed to continue Mucinex and Delsym.  Recent consult visits: 10/19/2020 Video Visit with Erica Wallace, DO with [facility] .Patient has lost about 30 lbs over the last year that she has been enrolled in this program. Diet recommended - protein rich, low simple carbohydrate diet. F/u 3 weeks.  10/14/2020 - Neurology (Dr Athar) Seen for sleep apnea. Had home sleep study that showed moderate obstructive sleep apnea. Continue CPAP - ordered new CPAP (AutoPAP) since her current CPAP is several years old.   Currently participating in outpatient rehab for left shoulder pain.  Hospital visits: None in previous 6 months  Objective:  Lab Results  Component Value Date   CREATININE 0.72 10/05/2020   CREATININE 0.91  06/16/2020   CREATININE 0.81 04/06/2020    Lab Results  Component Value Date   HGBA1C 5.2 04/06/2020   Last diabetic Eye exam: No results found for: HMDIABEYEEXA  Last diabetic Foot exam: No results found for: HMDIABFOOTEX      Component Value Date/Time   CHOL 156 10/05/2020 1059   TRIG 182.0 (H) 10/05/2020 1059   HDL 54.30 10/05/2020 1059   CHOLHDL 3 10/05/2020 1059   VLDL 36.4 10/05/2020 1059   LDLCALC 66 10/05/2020 1059   LDLCALC 70 04/06/2020 1059    Hepatic Function Latest Ref Rng & Units 06/16/2020 10/03/2019 04/03/2019  Total Protein 6.5 - 8.1 g/dL 6.8 6.6 6.4  Albumin 3.5 - 5.0 g/dL 3.9 3.8 4.2  AST 15 - 41 U/L 19 25 19  ALT 0 - 44 U/L 21 36 23  Alk Phosphatase 38 - 126 U/L 34(L) 39 31(L)  Total Bilirubin 0.3 - 1.2 mg/dL 0.4 0.4 0.5    Lab Results  Component Value Date/Time   TSH 0.88 10/05/2020 10:59 AM   TSH 1.370 10/17/2019 12:06 PM   FREET4 1.00 10/17/2019 12:06 PM    CBC Latest Ref Rng & Units 06/16/2020 12/12/2019 10/17/2019  WBC 4.0 - 10.5 K/uL 5.0 5.7 -  Hemoglobin 12.0 - 15.0 g/dL 12.5 13.0 -  Hematocrit 36.0 - 46.0 % 37.1 38.9 40.9  Platelets 150 - 400 K/uL 178 170 -    Lab Results  Component Value Date/Time   VD25OH 44.11 10/05/2020 10:59 AM   VD25OH 38.52 04/03/2019 10:37 AM    Clinical ASCVD: No  The 10-year ASCVD risk score (Goff DC Jr., et al., 2013) is:   19.3%   Values used to calculate the score:     Age: 69 years     Sex: Female     Is Non-Hispanic African American: No     Diabetic: Yes     Tobacco smoker: No     Systolic Blood Pressure: 128 mmHg     Is BP treated: Yes     HDL Cholesterol: 54.3 mg/dL     Total Cholesterol: 156 mg/dL    Other: Last DEXA 10/05/2020 showed stable BMP compared to 2019. Lowest T-Score was -1.3 at left neck of femur from 2019. Mildly osteopenic. Patient has h/o of tamoxifen therapy - completed in 202  Social History   Tobacco Use  Smoking Status Never Smoker  Smokeless Tobacco Never Used   BP Readings  from Last 3 Encounters:  10/23/20 128/81  10/05/20 128/82  09/24/20 132/86   Pulse Readings from Last 3 Encounters:  10/23/20 62  10/05/20 67  09/24/20 65   Wt Readings from Last 3 Encounters:  10/23/20 170 lb (77.1 kg)  10/05/20 175 lb (79.4 kg)  09/24/20 174 lb (78.9 kg)    Assessment: Review of patient past medical history, allergies, medications, health status, including review of consultants reports, laboratory and other test data, was performed as part of comprehensive evaluation and provision of chronic care management services.   SDOH:  (Social Determinants of Health) assessments and interventions performed:    CCM Care Plan  CARE PLAN ENTRY (see longitudinal plan of care for additional care plan information)  Current Barriers:  ? Chronic Disease Management support, education, and care coordination needs related to hypertension, elevated lipids, Metabolic Syndrome, GERD, Allergic Rhinitis, Osteopenia, History of Breast Cancer   Hypertension    BP Readings from Last 3 Encounters:  10/23/20 128/81  10/05/20 128/82  09/24/20 132/86   ? Pharmacist Clinical Goal(s):  Over the next 180 days, patient will work with PharmD and providers to maintain BP goal <140/90 ? Current regimen:  ? Telmisartan 80mg 1 tablet daily ? Hydrochlorothiazide 12.5mg take 1 capsule daily ? Carvedilol 6.25mg 1 tablet twice daily ? Amiloride 5mg  1 tablet daily ? Patient self care activities - Over the next 180 days, patient will:  Continue to check BP 3 times per week and document, and provide at future appointments  Ensure daily salt intake < 2300 mg/day  Continue current medications for blood pressure control  Limit intake of NSAIDs as they have increased blood pressure in past  Hyperlipidemia      Lab Results  Component Value Date/Time   LDLCALC 66 10/05/2020 10:59 AM   LDLCALC 70 04/06/2020 10:59 AM   ? Pharmacist Clinical Goal(s):  Over the next 180 days, patient  will work with PharmD and providers to maintain LDL goal < 100 ? Current regimen:   Simvastatin 20mg daily in evening ? Patient self care activities - Over the next 180 days, patient will:  Maintain cholesterol medication regimen.   Metabolic Syndrome      Lab Results  Component Value Date/Time   HGBA1C 5.2 04/06/2020 10:59 AM   HGBA1C 5.6 04/03/2019 10:37 AM   ? Pharmacist Clinical Goal(s):  Over the next 180 days, patient will work with PharmD and providers to maintain A1c goal <6.5% ? Current regimen:   Metformin 500mg twice daily ? Interventions:  Discussed dosing of metformin.  ? Patient self care activities - Over the next 180 days, patient will:  Maintain a1c <6.5%  Continue follow up with weight management clinic   and recommended diet. Great job!  Restart walking daily  Medication management ? Pharmacist Clinical Goal(s):  Over the next 180 days, patient will work with PharmD and providers to maintain optimal medication adherence ? [facility] ? Interventions  Comprehensive medication review performed. ? Patient self care activities - Over the next 180 days, patient will:  Focus on medication adherence by filling and taking medications appropriately   Take medications as prescribed  Report any questions or concerns to PharmD and/or provider(s)  Please see past updates related to this goal by clicking on the "Past Updates" button in the selected goal   Patient Goals/Self-Care Activities . Over the next 180 days, patient will:  take medications as prescribed and target a minimum of 150 minutes of moderate intensity exercise weekly Pharmacist Clinical Goal(s):  Marland Kitchen Over the next 180 days, patient will contact provider office for questions/concerns as evidenced notation of same in electronic health record through collaboration with PharmD and provider.  Interventions: . 1:1 collaboration with Copland, Gay Filler, MD regarding  development and update of comprehensive plan of care as evidenced by provider attestation and co-signature . Inter-disciplinary care team collaboration (see longitudinal plan of care) . Comprehensive medication review performed; medication list updated in electronic medical record  Allergies  Allergen Reactions  . Amlodipine Swelling  . Diclofenac Hypertension  . Lanolin   . Tape Other (See Comments)    Redness (Bandaids also)    Medications Reviewed Today    Reviewed by Tora Kindred, CMA (Certified Medical Assistant) on 10/23/20 at 1447  Med List Status: <None>  Medication Order Taking? Sig Documenting Provider Last Dose Status Informant  aMILoride (MIDAMOR) 5 MG tablet [id number] Yes Take 1 tablet (5 mg total) by mouth daily. Copland, Gay Filler, MD Taking Active   B Complex-C (B-COMPLEX WITH VITAMIN C) tablet [id number] Yes Take 1 tablet by mouth daily. [provider] Taking Active   CALCIUM CARBONATE-VITAMIN D PO [id number] Yes Take 1 tablet by mouth 2 (two) times daily. 674m calcium, D3 (1000)IU [provider] Taking Active   carvedilol (COREG) 6.25 MG tablet [id number] Take 6.25 mg by mouth 2 (two) times daily with a meal. [provider] Taking Active   Elderberry 575 MG/5ML SYRP [id number] Take by mouth daily. [provider] Taking Active   Fexofenadine HCl (ALLEGRA PO) [id number] Take by mouth. [provider] Taking Active   hydrochlorothiazide (MICROZIDE) 12.5 MG capsule [id number] Take 1 capsule (12.5 mg total) by mouth daily. Copland, JGay Filler MD Taking Active   metFORMIN (GLUCOPHAGE) 500 MG tablet [id number] Take 1 tablet (500 mg total) by mouth 2 (two) times daily. WBriscoe Deutscher DO Taking Active   pantoprazole (PROTONIX) 40 MG tablet [id number] Take 1 tablet (40 mg total) by mouth daily. PIrene Shipper MD Taking Active   simvastatin (ZOCOR) 20 MG tablet [id number] Take 1 tablet (20 mg total) by  mouth daily. Copland, JGay Filler MD Taking Active   telmisartan (MICARDIS) 80 MG tablet [id number] Take 1 tablet (80 mg total) by mouth daily. Copland, JGay Filler MD Taking Active           Patient Active Problem List   Diagnosis Date Noted  . Metabolic syndrome 017/79/3903 . Mixed hyperlipidemia 04/21/2020  . Polyp of ascending colon 04/21/2020  . History of breast cancer, Tamoxifen, nearing five years, Dr. MGriffith Citron09/14/2021  . Morbid obesity ([place] 09/30/2019  . Osteopenia 10/02/2018  . Malignant  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, estrogen receptor positive (HCC) 05/29/2017  . 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 Vitamin D deficiency 01/29/2016  . Eczema 12/29/2014  . Venous insufficiency 10/16/2014  . Essential hypertension 10/01/2014  . Hypercholesteremia 10/01/2014  . GERD (gastroesophageal reflux disease) 10/01/2014  . Other bilateral bundle branch block 06/23/2003    Immunization History  Administered Date(s) Administered  . Fluad Quad(high Dose 65+) 04/03/2019, 04/29/2020  . Hepatitis A 08/24/1995, 12/23/1996  . Hepatitis B 12/23/1996, 02/14/1997, 05/24/1997, 06/04/1998  . IPV 08/24/1995  . Influenza Split 04/06/2017  . Influenza, High Dose Seasonal PF 05/10/2016, 04/06/2017  . Influenza,inj,Quad PF,6+ Mos 04/30/2015  . Influenza-Unspecified 07/07/2005, 06/08/2006, 05/28/2007, 04/23/2008, 05/23/2011, 05/14/2012, 05/20/2013, 04/12/2018, 04/03/2019  . Meningococcal Conjugate 05/07/2008  . Meningococcal Mcv4o 05/07/2008  . PFIZER(Purple Top)SARS-COV-2 Vaccination 09/13/2019, 10/08/2019, 05/21/2020  . Pneumococcal Conjugate-13 03/22/2017  . Pneumococcal Polysaccharide-23 06/08/2006, 03/28/2018  . Tdap 09/19/2011  . Typhoid Inactivated 05/07/2008  . Yellow Fever 05/08/2008  . Zoster 06/21/2013  . Zoster Recombinat (Shingrix) 10/06/2018, 01/18/2019    Conditions to be addressed/monitored: HTN, HLD and metabolic  syndrome; osteopeni  Medication Assistance: None required.  Patient affirms current coverage meets needs.  Patient's preferred pharmacy is:  [facility] - [place], [place] - [address] [place] [address] Phone: [phone] Fax: [phone]  [facility] - [place], [place] - [address] [place] [address] Phone: [phone] Fax: [phone]  [id number] [place], [place] - [address] [place] [address] Phone: [phone] Fax: [phone]  Follow Up:  Patient agrees to Care Plan and Follow-up.  Plan: Restart daily exercise plan - goal of at least 150 minutes per week.  Telephone follow up appointment with care management team member scheduled for:  6 months  Tammy Eckard, PharmD Clinical Pharmacist [facility] [place]    

## 2020-10-28 NOTE — Patient Instructions (Signed)
Visit Information  PATIENT GOALS: Goals Addressed            This Visit's Progress   . Blood presure goal <140/90   On track   . COMPLETED: Consider completing DEXA Scan around 07/26/20 or later   On track   . Increase physical activity   Not on track    Restart walking daily now that weather has improved.  Try to identify alternative exercise options or place to walk indoors (like a mall or church gym) when weather is either too hot or too cold for outside walking.     Laura Bush   On track    [place] (see longitudinal plan of care for additional care plan information)  Current Barriers:  . Chronic Disease Management support, education, and care coordination needs related to hypertension, elevated lipids, Metabolic Syndrome, GERD, Allergic Rhinitis, Osteopenia, History of Breast Cancer   Hypertension BP Readings from Last 3 Encounters:  10/23/20 128/81  10/05/20 128/82  09/24/20 132/86   . Pharmacist Clinical Goal(s): o Over the next 180 days, patient will work with PharmD and providers to maintain BP goal <140/90 . Current regimen:   Telmisartan 80mg  1 tablet daily  Hydrochlorothiazide 12.5mg  take 1 capsule daily  Carvedilol 6.25mg  1 tablet twice daily  Amiloride 5mg   1 tablet daily . Patient self care activities - Over the next 180 days, patient will: o Continue to check BP 3 times per week and document, and provide at future appointments o Ensure daily salt intake < 2300 mg/day o Continue current medications for blood pressure control o Limit intake of NSAIDs as they have increased blood pressure in past  Hyperlipidemia Lab Results  Component Value Date/Time   LDLCALC 66 10/05/2020 10:59 AM   LDLCALC 70 04/06/2020 10:59 AM   . Pharmacist Clinical Goal(s): o Over the next 180 days, patient will work with PharmD and providers to maintain LDL goal < 100 . Current regimen:  o Simvastatin 20mg  daily in evening . Patient self care activities - Over the next 180  days, patient will: o Maintain cholesterol medication regimen.   Metabolic Syndrome Lab Results  Component Value Date/Time   HGBA1C 5.2 04/06/2020 10:59 AM   HGBA1C 5.6 04/03/2019 10:37 AM   . Pharmacist Clinical Goal(s): o Over the next 180 days, patient will work with PharmD and providers to maintain A1c goal <6.5% . Current regimen:  o Metformin 500mg  twice daily . Interventions: o Discussed dosing of metformin.  . Patient self care activities - Over the next 180 days, patient will: o Maintain a1c <6.5% o Continue follow up with weight management clinic and recommended diet. Great job! o Restart walking daily  Medication management . Pharmacist Clinical Goal(s): o Over the next 180 days, patient will work with PharmD and providers to maintain optimal medication adherence . [facility] . Interventions o Comprehensive medication review performed. . Patient self care activities - Over the next 180 days, patient will: o Focus on medication adherence by filling and taking medications appropriately  o Take medications as prescribed o Report any questions or concerns to PharmD and/or provider(s)  Please see past updates related to this goal by clicking on the "Past Updates" button in the selected goal        Patient verbalizes understanding of instructions provided today and agrees to view in [place].   Telephone follow up appointment with care management team member scheduled for: 6 months  Cherre Robins, PharmD Clinical  Pharmacist [place] [phone]

## 2020-10-29 ENCOUNTER — Telehealth: Payer: Self-pay | Admitting: *Deleted

## 2020-10-29 ENCOUNTER — Other Ambulatory Visit: Payer: Self-pay

## 2020-10-29 ENCOUNTER — Encounter: Payer: Self-pay | Admitting: Family Medicine

## 2020-10-29 ENCOUNTER — Ambulatory Visit: Payer: Medicare HMO | Admitting: Family Medicine

## 2020-10-29 VITALS — BP 104/74 | Ht 59.0 in | Wt 174.6 lb

## 2020-10-29 DIAGNOSIS — G8929 Other chronic pain: Secondary | ICD-10-CM

## 2020-10-29 DIAGNOSIS — M25511 Pain in right shoulder: Secondary | ICD-10-CM

## 2020-10-29 DIAGNOSIS — M25512 Pain in left shoulder: Secondary | ICD-10-CM

## 2020-10-29 DIAGNOSIS — R7989 Other specified abnormal findings of blood chemistry: Secondary | ICD-10-CM

## 2020-10-29 NOTE — Patient Instructions (Signed)
Thank you for coming in today.  Continue PT and home exercises.   Recheck with me as needed.

## 2020-10-29 NOTE — Telephone Encounter (Signed)
Pt is scheduled for lab appointment tomorrow but I do not see any future orders in Epic.  Please place orders if appropriate or call pt to cancel appt if labs not needed at this time.

## 2020-10-29 NOTE — Telephone Encounter (Signed)
Pt need hemochromatosis pcr- ordered for her.  Thank you!

## 2020-10-30 ENCOUNTER — Other Ambulatory Visit: Payer: Self-pay

## 2020-10-30 ENCOUNTER — Ambulatory Visit: Payer: Medicare HMO | Admitting: Physical Therapy

## 2020-10-30 ENCOUNTER — Encounter: Payer: Self-pay | Admitting: Physical Therapy

## 2020-10-30 ENCOUNTER — Other Ambulatory Visit (INDEPENDENT_AMBULATORY_CARE_PROVIDER_SITE_OTHER): Payer: Medicare HMO

## 2020-10-30 DIAGNOSIS — M6281 Muscle weakness (generalized): Secondary | ICD-10-CM

## 2020-10-30 DIAGNOSIS — R7989 Other specified abnormal findings of blood chemistry: Secondary | ICD-10-CM

## 2020-10-30 DIAGNOSIS — M25512 Pain in left shoulder: Secondary | ICD-10-CM | POA: Diagnosis not present

## 2020-10-30 DIAGNOSIS — R293 Abnormal posture: Secondary | ICD-10-CM

## 2020-10-30 DIAGNOSIS — M542 Cervicalgia: Secondary | ICD-10-CM

## 2020-10-30 NOTE — Therapy (Signed)
[facility] [address]  [place], [place], [address] Phone: [phone]   Fax:  [phone]  Physical Therapy Treatment  Patient Details  Name: Laura Bush MRN: [id number] Date of Birth: August 14, 1950 Referring Provider (PT): Gregor Hams, MD   Encounter Date: 10/30/2020   PT End of Session - 10/30/20 1104    Visit Number 4    Number of Visits 12    Date for PT Re-Evaluation 11/11/20    Authorization Type [employer]    PT Start Time 1104    PT Stop Time 1201    PT Time Calculation (min) 57 min    Activity Tolerance Patient tolerated treatment well    Behavior During Therapy [facility] for tasks assessed/performed           Past Medical History:  Diagnosis Date  . Asthma    in cold weather  . Breast cancer ([place]) 09/18/14   left breast  . Breast cancer ([place]) 10/07/14   bx left breast  . Breast cancer of upper-outer quadrant of left female breast ([place]) 09/22/2014  . Cluster headaches    in her 40's  . GERD (gastroesophageal reflux disease)   . Heart murmur    as a child (has outgrown)  . High cholesterol   . Hypertension   . Lower extremity edema   . Microscopic colitis   . Morbid obesity ([place]) 09/30/2019  . OSA on CPAP   . Personal history of radiation therapy   . Pneumonia 2000's X 1  . PONV (postoperative nausea and vomiting)   . Rosacea   . S/P radiation therapy 12/30/14-01/27/15   left breast 50Gy total dose  . 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 Swallowing difficulty   . Venous insufficiency     Past Surgical History:  Procedure Laterality Date  . ABDOMINAL HYSTERECTOMY  1999  . APPENDECTOMY  ~ 2006  . BREAST BIOPSY Left 10/2014  . BREAST LUMPECTOMY Left 2016  . BREAST REDUCTION SURGERY Bilateral 11/11/2014   Procedure: Bilateral Breast Reduction;  Surgeon: Crissie Reese, MD;  Location: [place];  Service: Plastics;  Laterality: Bilateral;  . CARPAL TUNNEL RELEASE Bilateral    2  surgeries on right, 1 on left  . CESAREAN SECTION  1978; 1980  . COLONOSCOPY    . FOREARM FRACTURE SURGERY Right 2003 X 3  . KNEE ARTHROSCOPY Bilateral    meniscus repair  . PARTIAL MASTECTOMY WITH NEEDLE LOCALIZATION AND AXILLARY SENTINEL LYMPH NODE BX Left 11/11/2014   Procedure: LEFT BREAST PARTIAL MASTECTOMY WITH NEEDLE LOCALIZATION TIMES TWO AND LEFT AXILLARY SENTINEL LYMPH NODE Biopsy;  Surgeon: Fanny Skates, MD;  Location: [place];  Service: General;  Laterality: Left;  . SQUAMOUS CELL CARCINOMA EXCISION  1980's X 1   "nose"  . TONSILLECTOMY  ~ 1957  . TUBAL LIGATION  ?1984    There were no vitals filed for this visit.   Subjective Assessment - 10/30/20 1107    Subjective Pt reports she has been sick for the past 2 weeks and feels drained. Has not been doing the HEP due to feeling ill.    Diagnostic tests 09/17/20 - shoulder x-rays: L - Mild AC joint degenerative  changes; R - Mild glenohumeral and moderate AC joint degenerative changes    Patient Stated Goals "some direction on movement or motion to do to prevent or alleviate the discomfort"    Currently in Pain? Yes    Pain Score  3     Pain Location Scapula   & R side of lower neck   Pain Orientation Left;Right   L>R   Pain Descriptors / Indicators Dull;Aching    Pain Type Acute pain    Pain Frequency Intermittent    Aggravating Factors  sewing, sitting at the computer    Pain Relieving Factors Tylenol Arthritis, Aleve, Ibuprofen & heat                             [facility] Adult PT Treatment/Exercise - 10/30/20 1104      Exercises   Exercises Shoulder      Shoulder Exercises: Supine   Horizontal ABduction Both;10 reps;Strengthening;Theraband    Theraband Level (Shoulder Horizontal ABduction) Level 2 (Red)    Horizontal ABduction Limitations hooklying on pool noodle to encourage increased scap retraction    External Rotation Both;10 reps;Strengthening;Theraband    Theraband Level (Shoulder External  Rotation) Level 2 (Red)    External Rotation Limitations hooklying on pool noodle to encourage increased scap retraction    Diagonals Both;10 reps;Strengthening;Theraband    Theraband Level (Shoulder Diagonals) Level 2 (Red)    Diagonals Limitations hooklying on pool noodle to encourage increased scap retraction      Shoulder Exercises: Standing   External Rotation Left;10 reps;Strengthening;Theraband   2 sets   Theraband Level (Shoulder External Rotation) Level 2 (Red)    Internal Rotation Left;10 reps;Strengthening;Theraband   2 sets   Theraband Level (Shoulder Internal Rotation) Level 2 (Red)    Extension Left;10 reps;Strengthening;Theraband   3 sets   Theraband Level (Shoulder Extension) Level 3 (Green)    Row Both;10 reps;Theraband;Strengthening   3 sets   Theraband Level (Shoulder Row) Level 3 (Green)      Shoulder Exercises: ROM/Strengthening   UBE (Upper Arm Bike) L1.5 x 6 min (3' fwd/3' back)      Shoulder Exercises: Stretch   Other Shoulder Stretches B 3-way doorway pec stretch x 30 sec each    Other Shoulder Stretches Hooklying snow angel pec stretch over pool noodle x 60 sec      Modalities   Modalities Electrical Stimulation;Moist Heat      Moist Heat Therapy   Number Minutes Moist Heat 10 Minutes    Moist Heat Location Cervical;Shoulder      Electrical Stimulation   Electrical Stimulation Location B lower cervical & L posterior shoulder complex    Electrical Stimulation Action IFC    Electrical Stimulation Parameters 80-150 Hx, intensity to pt tol x 10'    Electrical Stimulation Goals Pain;Tone      Manual Therapy   Manual Therapy Soft tissue mobilization;Myofascial release;Passive ROM    Manual therapy comments hooklying on pool noodle    Soft tissue mobilization STM/DTM to L>R UT, LS, rhomboids and pecs    Myofascial Release manual TPR to L UT    Passive ROM gentle overpressure into scap retraction and depression over pool noodle                     PT Short Term Goals - 10/30/20 1111      PT SHORT TERM GOAL #1   Title Patient will be independent with initial HEP    Status Achieved   10/30/20            PT Long Term Goals - 10/30/20 1111      PT LONG TERM GOAL #1   Title Patient will be  independent with ongoing/advanced HEP for self-management at home    Status On-going    Target Date 11/11/20      PT LONG TERM GOAL #2   Title Improve posture and alignment with patient to demonstrate improved upright posture with posterior shoulder girdle engaged    Status On-going    Target Date 11/11/20      PT LONG TERM GOAL #3   Title Patient to improve B shoulder AROM to WNL without pain provocation    Status On-going    Target Date 11/11/20      PT LONG TERM GOAL #4   Title Patient will demonstrate improved B shoulder strength to >/= 4+/5 w/o pain provocation for functional UE use    Status On-going    Target Date 11/11/20      PT LONG TERM GOAL #5   Title Patient to report ability to perform ADLs, household, and work-related tasks without increased pain    Status On-going    Target Date 11/11/20                 Plan - 10/30/20 1151    Clinical Impression Statement Laura Bush returns to PT after 2-week absence due to illness, reporting limited performance of HEP due to illness. HEP reviewed with only minor clarifications necessary to avoid excessive posterior shoulder ROM during rows/retractions - STG met. She notes good relief from neck stretches but continues to demonstrate increased muscle tension and TPs in L UT, LS and periscapular muscles as well as pecs - addressed with STM/MFR with some relief noted but may benefit from DN in future visits. Progressed postural stretching and strengthening with pecs stretches and scapular retraction exercise in hooklying over pool noodle - pt reporting increased fatigue, likely due to still recovering from illness, but no increased pain. Session concluded with estim and  moist heat to promote further muscle relaxation with pt noting good benefit - will provide info on home unit options at pt request. 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    Comorbidities L breast cancer with partial mastectomy 2016, B breast reduction surgery at time of mastectomy, h/o bone spur R shoulder, B CTR, R forearm fx, B knee scope with meniscal repair, HTN, osteopenia, bilateral BBB, GERD    Rehab Potential Good    PT Frequency 2x / week    PT Duration 6 weeks    PT Treatment/Interventions ADLs/Self Care Home Management;Cryotherapy;Electrical Stimulation;Iontophoresis 34m/ml Dexamethasone;Moist Heat;Functional mobility training;Therapeutic activities;Therapeutic exercise;Neuromuscular re-education;Patient/family education;Manual techniques;Passive range of motion;Dry needling;Taping;Spinal Manipulations    PT Next Visit Plan Recert; provide info on DN +/- home TENS unit options per pt prference; progress postural stretching and strengthening; manual therapy and modalities PRN to address increased muscle tension/pain    PT Home Exercise Plan MedBridge Access Code: [email or web address](2/23), [email or web address] (3/11)    Consulted and Agree with Plan of Care Patient           Patient will benefit from skilled therapeutic intervention in order to improve the following deficits and impairments:  Pain,Postural dysfunction,Improper body mechanics,Increased fascial restricitons,Increased muscle spasms,Impaired flexibility,Decreased range of motion,Decreased strength,Decreased activity tolerance  Visit Diagnosis: Acute pain of left shoulder  Cervicalgia  Abnormal posture  Muscle weakness (generalized)     Problem List Patient Active Problem List   Diagnosis Date Noted  . Metabolic syndrome 050/53/9767 . Mixed hyperlipidemia 04/21/2020  .  Polyp of  ascending colon 04/21/2020  . History of breast cancer, Tamoxifen, nearing five years, Dr. Griffith Citron 04/21/2020  . Morbid obesity ([place]) 09/30/2019  . Osteopenia 10/02/2018  . Malignant 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, estrogen receptor positive ([place]) 05/29/2017  . 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 Vitamin D deficiency 01/29/2016  . Eczema 12/29/2014  . Venous insufficiency 10/16/2014  . Essential hypertension 10/01/2014  . Hypercholesteremia 10/01/2014  . GERD (gastroesophageal reflux disease) 10/01/2014  . Other bilateral bundle branch block 06/23/2003    Percival Spanish, PT, MPT 10/30/2020, 12:20 PM  [facility] [address]  [place], [place], [address] Phone: [phone]   Fax:  [phone]  Name: Jashley Watson Bush MRN: [id number] Date of Birth: [birth year]

## 2020-11-03 ENCOUNTER — Ambulatory Visit: Payer: Medicare HMO | Admitting: Physical Therapy

## 2020-11-03 ENCOUNTER — Encounter: Payer: Self-pay | Admitting: Physical Therapy

## 2020-11-03 ENCOUNTER — Other Ambulatory Visit: Payer: Self-pay

## 2020-11-03 DIAGNOSIS — R293 Abnormal posture: Secondary | ICD-10-CM | POA: Diagnosis not present

## 2020-11-03 DIAGNOSIS — M6281 Muscle weakness (generalized): Secondary | ICD-10-CM

## 2020-11-03 DIAGNOSIS — M542 Cervicalgia: Secondary | ICD-10-CM

## 2020-11-03 DIAGNOSIS — M25512 Pain in left shoulder: Secondary | ICD-10-CM

## 2020-11-03 NOTE — Therapy (Signed)
[facility] [address]  [place], [place], [address] Phone: [phone]   Fax:  [phone]  Physical Therapy Treatment / Recert  Patient Details  Name: Laura Bush MRN: [id number] Date of Birth: Dec 20, 1950 Referring Provider (PT): Gregor Hams, MD   Encounter Date: 11/03/2020   PT End of Session - 11/03/20 1105    Visit Number 5    Number of Visits 17    Date for PT Re-Evaluation 12/15/20    Authorization Type [employer] Medicare    Progress Note Due on Visit 15   Recert on visit #5 - 5/32/99   PT Start Time 1105    PT Stop Time 1152    PT Time Calculation (min) 47 min    Activity Tolerance Patient tolerated treatment well    Behavior During Therapy [facility] for tasks assessed/performed           Past Medical History:  Diagnosis Date  . Asthma    in cold weather  . Breast cancer ([place]) 09/18/14   left breast  . Breast cancer ([place]) 10/07/14   bx left breast  . Breast cancer of upper-outer quadrant of left female breast ([place]) 09/22/2014  . Cluster headaches    in her 40's  . GERD (gastroesophageal reflux disease)   . Heart murmur    as a child (has outgrown)  . High cholesterol   . Hypertension   . Lower extremity edema   . Microscopic colitis   . Morbid obesity ([place]) 09/30/2019  . OSA on CPAP   . Personal history of radiation therapy   . Pneumonia 2000's X 1  . PONV (postoperative nausea and vomiting)   . Rosacea   . S/P radiation therapy 12/30/14-01/27/15   left breast 50Gy total dose  . 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 Swallowing difficulty   . Venous insufficiency     Past Surgical History:  Procedure Laterality Date  . ABDOMINAL HYSTERECTOMY  1999  . APPENDECTOMY  ~ 2006  . BREAST BIOPSY Left 10/2014  . BREAST LUMPECTOMY Left 2016  . BREAST REDUCTION SURGERY Bilateral 11/11/2014   Procedure: Bilateral Breast Reduction;  Surgeon: Crissie Reese, MD;  Location: [place];  Service: Plastics;   Laterality: Bilateral;  . CARPAL TUNNEL RELEASE Bilateral    2 surgeries on right, 1 on left  . CESAREAN SECTION  1978; 1980  . COLONOSCOPY    . FOREARM FRACTURE SURGERY Right 2003 X 3  . KNEE ARTHROSCOPY Bilateral    meniscus repair  . PARTIAL MASTECTOMY WITH NEEDLE LOCALIZATION AND AXILLARY SENTINEL LYMPH NODE BX Left 11/11/2014   Procedure: LEFT BREAST PARTIAL MASTECTOMY WITH NEEDLE LOCALIZATION TIMES TWO AND LEFT AXILLARY SENTINEL LYMPH NODE Biopsy;  Surgeon: Fanny Skates, MD;  Location: [place];  Service: General;  Laterality: Left;  . SQUAMOUS CELL CARCINOMA EXCISION  1980's X 1   "nose"  . TONSILLECTOMY  ~ 1957  . TUBAL LIGATION  ?1984    There were no vitals filed for this visit.   Subjective Assessment - 11/03/20 1108    Subjective Pt reports she is still feeling fatigued/tired from her recent respiratory illness. Pt noting more she was more uncomfortable while doing the HEP yesterday - mostly with the neck stretches.    Diagnostic tests 09/17/20 - shoulder x-rays: L - Mild AC joint degenerative  changes; R - Mild glenohumeral and moderate AC joint degenerative changes    Patient Stated Goals "some direction  on movement or motion to do to prevent or alleviate the discomfort"    Currently in Pain? Yes    Pain Score 4     Pain Location Scapula    Pain Orientation Left    Pain Descriptors / Indicators Dull;Aching    Pain Type Acute pain    Pain Frequency Intermittent    Pain Score 2    Pain Location Neck    Pain Orientation Right    Pain Descriptors / Indicators Dull;Aching    Pain Type Acute pain    Pain Frequency Intermittent              [facility] PT Assessment - 11/03/20 1105      Assessment   Medical Diagnosis Chronic pain of B shoulders, upper back & neck    Referring Provider (PT) Gregor Hams, MD    Onset Date/Surgical Date --   Oct 2021   Hand Dominance Right    Next MD Visit PRN      Prior Function   Level of Independence Independent    Vocation Retired     Leisure being a grandmother; sewing, painting, gardening; yoga 3x/wk x 1 hr, walking 2 miles/day x 5 days/wk in warm weather      AROM   Overall AROM  Within functional limits for tasks performed    Overall AROM Comments B shoulder ROM WNL/WFL but pain still noted in L upper shoulder with L shouder abduction & FER      Strength   Right Shoulder Flexion 4/5    Right Shoulder ABduction 4/5    Right Shoulder Internal Rotation 4/5    Right Shoulder External Rotation 4/5    Left Shoulder Flexion 4/5    Left Shoulder ABduction 4-/5    Left Shoulder Internal Rotation 4-/5    Left Shoulder External Rotation 4-/5                         [facility] Adult PT Treatment/Exercise - 11/03/20 1105      Exercises   Exercises Shoulder      Shoulder Exercises: ROM/Strengthening   UBE (Upper Arm Bike) L1.5 x 6 min (3' fwd/3' back)      Manual Therapy   Manual Therapy Soft tissue mobilization;Myofascial release    Soft tissue mobilization STM/DTM to L UT > LS    Myofascial Release manual TPR to L UT      Neck Exercises: Stretches   Upper Trapezius Stretch Right;Left;1 rep;30 seconds    Levator Stretch Right;Left;1 rep;30 seconds    Neck Stretch 1 rep;30 seconds    Neck Stretch Limitations SCM stretch with hand resting on clavicle                  PT Education - 11/03/20 1148    Education Details Review of progress and current goal status; Info on DN and home TENS unit options per pt request    Person(s) Educated Patient    Methods Explanation;Handout    Comprehension Verbalized understanding            PT Short Term Goals - 10/30/20 1111      PT SHORT TERM GOAL #1   Title Patient will be independent with initial HEP    Status Achieved   10/30/20            PT Long Term Goals - 11/03/20 1113      PT LONG TERM GOAL #1  Title Patient will be independent with ongoing/advanced HEP for self-management at home    Status On-going   11/03/20 - met for current HEP  but limited tolerance for UT stretch   Target Date 12/15/20      PT LONG TERM GOAL #2   Title Improve posture and alignment with patient to demonstrate improved upright posture with posterior shoulder girdle engaged    Status Partially Met   11/03/20 - pt notes improved awareness of posture but continues to need to frequently self-correct   Target Date 12/15/20      PT LONG TERM GOAL #3   Title Patient to improve B shoulder AROM to WNL without pain provocation    Status Partially Met   11/03/20 - B shoulder AROM WNL but pain still noted with L shoulder abduction & FER   Target Date 12/15/20      PT LONG TERM GOAL #4   Title Patient will demonstrate improved B shoulder strength to >/= 4+/5 w/o pain provocation for functional UE use    Status On-going    Target Date 12/15/20      PT LONG TERM GOAL #5   Title Patient to report ability to perform ADLs, household, and work-related tasks without increased pain    Status On-going   11/03/20 - pain is intermittent but can be limiting up to 60% when present   Target Date 12/15/20                 Plan - 11/03/20 1151    Clinical Impression Statement [name] report 15-20% improvement in her pain and function since start of PT but has been limited in her ability to attend PT and complete her HEP for >2 weeks due to respiratory illness. She notes improved awareness of neutral neck and shoulder posture but continues to require somewhat frequent self-correction. Her B shoulder AROM is WNL however pain still noted in L upper shoulder with abduction & ER. Significant increased muscle tension and tenderness persists in L UT > LS which may benefit from DN but pt wishing to wait until she returns from vacation next week to try this. B shoulder weakness still present with L shoulder slightly weaker than on initial eval due to limited ability to complete HEP. Ongoing pain is intermittent but when present will still limit her up to 60% with completion of normal  daily activities. All STGs now met with LTGs partially met or ongoing. Given progress with PT with ongoing deficits as above, will recommend recert for continued skilled PT for additional 2x/wk for 4-6 weeks.    Personal Factors and Comorbidities Time since onset of injury/illness/exacerbation;Past/Current Experience;Comorbidity 3+    Comorbidities L breast cancer with partial mastectomy 2016, B breast reduction surgery at time of mastectomy, h/o bone spur R shoulder, B CTR, R forearm fx, B knee scope with meniscal repair, HTN, osteopenia, bilateral BBB, GERD    Examination-Activity Limitations Reach Overhead    Examination-Participation Restrictions Cleaning;Laundry;Meal Prep;Shop    Rehab Potential Good    PT Frequency 2x / week    PT Duration 6 weeks    PT Treatment/Interventions ADLs/Self Care Home Management;Cryotherapy;Electrical Stimulation;Iontophoresis 73m/ml Dexamethasone;Moist Heat;Functional mobility training;Therapeutic activities;Therapeutic exercise;Neuromuscular re-education;Patient/family education;Manual techniques;Passive range of motion;Dry needling;Taping;Spinal Manipulations    PT Next Visit Plan progress postural stretching and strengthening; manual therapy including possible DN and modalities PRN to address increased muscle tension/pain    PT Home Exercise Plan MedBridge Access Code: [email or web address](2/23), [email or web address] (3/11)    Consulted and  Agree with Plan of Care Patient           Patient will benefit from skilled therapeutic intervention in order to improve the following deficits and impairments:  Pain,Postural dysfunction,Improper body mechanics,Increased fascial restricitons,Increased muscle spasms,Impaired flexibility,Decreased range of motion,Decreased strength,Decreased activity tolerance  Visit Diagnosis: Acute pain of left shoulder  Cervicalgia  Abnormal posture  Muscle weakness (generalized)     Problem List Patient Active Problem List   Diagnosis Date Noted   . Metabolic syndrome 70/62/3762  . Mixed hyperlipidemia 04/21/2020  . Polyp of ascending colon 04/21/2020  . History of breast cancer, Tamoxifen, nearing five years, Dr. Griffith Citron 04/21/2020  . Morbid obesity ([place]) 09/30/2019  . Osteopenia 10/02/2018  . Malignant 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, estrogen receptor positive ([place]) 05/29/2017  . 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 Vitamin D deficiency 01/29/2016  . Eczema 12/29/2014  . Venous insufficiency 10/16/2014  . Essential hypertension 10/01/2014  . Hypercholesteremia 10/01/2014  . GERD (gastroesophageal reflux disease) 10/01/2014  . Other bilateral bundle branch block 06/23/2003    Percival Spanish, PT, MPT 11/03/2020, 12:39 PM  [facility] [address]  [place], [place], [address] Phone: [phone]   Fax:  [phone]  Name: Laura Bush MRN: [id number] Date of Birth: 04-21-1951

## 2020-11-03 NOTE — Patient Instructions (Addendum)
TENS UNIT  This is helpful for muscle pain and spasm.   Search and Purchase a TENS 7000 2nd edition at www.tenspros.com or www.amazon.com  (It should be less than $30)     TENS unit instructions:   Do not shower or bathe with the unit on  Turn the unit off before removing electrodes or batteries  If the electrodes lose stickiness add a drop of water to the electrodes after they are disconnected from the unit and place on plastic sheet. If you continued to have difficulty, call the TENS unit company to purchase more electrodes.  Do not apply lotion on the skin area prior to use. Make sure the skin is clean and dry as this will help prolong the life of the electrodes.  After use, always check skin for unusual red areas, rash or other skin difficulties. If there are any skin problems, does not apply electrodes to the same area.  Never remove the electrodes from the unit by pulling the wires.  Do not use the TENS unit or electrodes other than as directed.  Do not change electrode placement without consulting your therapist or physician.  Keep 2 fingers with between each electrode.   TENS stands for Transcutaneous Electrical Nerve Stimulation. In other words, electrical impulses are allowed to pass through the skin in order to excite a nerve.   Purpose and Use of TENS:  TENS is a method used to manage acute and chronic pain without the use of drugs. It has been effective in managing pain associated with surgery, sprains, strains, trauma, rheumatoid arthritis, and neuralgias. It is a non-addictive, low risk, and non-invasive technique used to control pain. It is not, by any means, a curative form of treatment.   How TENS Works:  Most TENS units are a Paramedic unit powered by one 9 volt battery. Attached to the outside of the unit are two lead wires where two pins and/or snaps connect on each wire. All units come with a set of four reusable pads or electrodes. These are placed  on the skin surrounding the area involved. By inserting the leads into  the pads, the electricity can pass from the unit making the circuit complete.  As the intensity is turned up slowly, the electrical current enters the body from the electrodes through the skin to the surrounding nerve fibers. This triggers the release of hormones from within the body. These hormones contain pain relievers. By increasing the circulation of these hormones, the person's pain may be lessened. It is also believed that the electrical stimulation itself helps to block the pain messages being sent to the brain, thus also decreasing the body's perception of pain.   Hazards:  TENS units are NOT to be used by patients with PACEMAKERS, DEFIBRILLATORS, DIABETIC PUMPS, PREGNANT WOMEN, and patients with SEIZURE DISORDERS.  TENS units are NOT to be used over the heart, throat, brain, or spinal cord.  One of the major side effects from the TENS unit may be skin irritation. Some people may develop a rash if they are sensitive to the materials used in the electrodes or the connecting wires.   Wear the unit for up to 30-45 minutes at a time, 3-4x/day as needed for pain.   Avoid overuse due the body getting used to the stem making it not as effective over time.     Trigger Point Dry Needling  . What is Trigger Point Dry Needling (DN)? o DN is a physical therapy technique used to  treat muscle pain and dysfunction. Specifically, DN helps deactivate muscle trigger points (muscle knots).  o A thin filiform needle is used to penetrate the skin and stimulate the underlying trigger point. The goal is for a local twitch response (LTR) to occur and for the trigger point to relax. No medication of any kind is injected during the procedure.   . What Does Trigger Point Dry Needling Feel Like?  o The procedure feels different for each individual patient. Some patients report that they do not actually feel the needle enter the skin and  overall the process is not painful. Very mild bleeding may occur. However, many patients feel a deep cramping in the muscle in which the needle was inserted. This is the local twitch response.   Marland Kitchen How Will I feel after the treatment? o Soreness is normal, and the onset of soreness may not occur for a few hours. Typically this soreness does not last longer than two days.  o Bruising is uncommon, however; ice can be used to decrease any possible bruising.  o In rare cases feeling tired or nauseous after the treatment is normal. In addition, your symptoms may get worse before they get better, this period will typically not last longer than 24 hours.   . What Can I do After My Treatment? o Increase your hydration by drinking more water for the next 24 hours. o You may place ice or heat on the areas treated that have become sore, however, do not use heat on inflamed or bruised areas. Heat often brings more relief post needling. o You can continue your regular activities, but vigorous activity is not recommended initially after the treatment for 24 hours. o DN is best combined with other physical therapy such as strengthening, stretching, and other therapies.

## 2020-11-09 LAB — HEMOCHROMATOSIS DNA-PCR(C282Y,H63D)

## 2020-11-10 ENCOUNTER — Encounter: Payer: Self-pay | Admitting: Family Medicine

## 2020-11-10 ENCOUNTER — Encounter: Payer: Self-pay | Admitting: Physical Therapy

## 2020-11-10 ENCOUNTER — Other Ambulatory Visit: Payer: Self-pay

## 2020-11-10 ENCOUNTER — Ambulatory Visit: Payer: Medicare HMO | Attending: Family Medicine | Admitting: Physical Therapy

## 2020-11-10 DIAGNOSIS — M542 Cervicalgia: Secondary | ICD-10-CM | POA: Insufficient documentation

## 2020-11-10 DIAGNOSIS — R7989 Other specified abnormal findings of blood chemistry: Secondary | ICD-10-CM

## 2020-11-10 DIAGNOSIS — M25511 Pain in right shoulder: Secondary | ICD-10-CM

## 2020-11-10 DIAGNOSIS — M6281 Muscle weakness (generalized): Secondary | ICD-10-CM | POA: Insufficient documentation

## 2020-11-10 DIAGNOSIS — R293 Abnormal posture: Secondary | ICD-10-CM | POA: Diagnosis not present

## 2020-11-10 DIAGNOSIS — M25512 Pain in left shoulder: Secondary | ICD-10-CM | POA: Insufficient documentation

## 2020-11-10 NOTE — Therapy (Signed)
[facility] [address]  [place], [place], [address] Phone: [phone]   Fax:  [phone]  Physical Therapy Evaluation  Patient Details  Name: Laura Bush MRN: [id number] Date of Birth: [birth year] Referring Provider (PT): Gregor Hams, MD   Encounter Date: 11/10/2020   PT End of Session - 11/10/20 1448    Visit Number 6    Number of Visits 17    Date for PT Re-Evaluation 12/15/20    Authorization Type [employer]    Progress Note Due on Visit 15   Recert on visit #5 - 10/20/95   PT Start Time 1448    PT Stop Time 1547    PT Time Calculation (min) 59 min    Activity Tolerance Patient tolerated treatment well    Behavior During Therapy [facility] for tasks assessed/performed           Past Medical History:  Diagnosis Date  . Asthma    in cold weather  . Breast cancer ([place]) 09/18/14   left breast  . Breast cancer ([place]) 10/07/14   bx left breast  . Breast cancer of upper-outer quadrant of left female breast ([place]) 09/22/2014  . Cluster headaches    in her 40's  . GERD (gastroesophageal reflux disease)   . Heart murmur    as a child (has outgrown)  . High cholesterol   . Hypertension   . Lower extremity edema   . Microscopic colitis   . Morbid obesity ([place]) 09/30/2019  . OSA on CPAP   . Personal history of radiation therapy   . Pneumonia 2000's X 1  . PONV (postoperative nausea and vomiting)   . Rosacea   . S/P radiation therapy 12/30/14-01/27/15   left breast 50Gy total dose  . 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 Swallowing difficulty   . Venous insufficiency     Past Surgical History:  Procedure Laterality Date  . ABDOMINAL HYSTERECTOMY  1999  . APPENDECTOMY  ~ 2006  . BREAST BIOPSY Left 10/2014  . BREAST LUMPECTOMY Left 2016  . BREAST REDUCTION SURGERY Bilateral 11/11/2014   Procedure: Bilateral Breast Reduction;  Surgeon: Crissie Reese, MD;  Location: [facility];  Service: Plastics;   Laterality: Bilateral;  . CARPAL TUNNEL RELEASE Bilateral    2 surgeries on right, 1 on left  . CESAREAN SECTION  1978; 1980  . COLONOSCOPY    . FOREARM FRACTURE SURGERY Right 2003 X 3  . KNEE ARTHROSCOPY Bilateral    meniscus repair  . PARTIAL MASTECTOMY WITH NEEDLE LOCALIZATION AND AXILLARY SENTINEL LYMPH NODE BX Left 11/11/2014   Procedure: LEFT BREAST PARTIAL MASTECTOMY WITH NEEDLE LOCALIZATION TIMES TWO AND LEFT AXILLARY SENTINEL LYMPH NODE Biopsy;  Surgeon: Fanny Skates, MD;  Location: [facility];  Service: General;  Laterality: Left;  . SQUAMOUS CELL CARCINOMA EXCISION  1980's X 1   "nose"  . TONSILLECTOMY  ~ 1957  . TUBAL LIGATION  ?1984    There were no vitals filed for this visit.    Subjective Assessment - 11/10/20 1450    Subjective Pt reports her trip to [facility] was very intense and then she drove 10 hrs back from [place] everything is more tense and sore today.    Diagnostic tests 09/17/20 - shoulder x-rays: L - Mild AC joint degenerative  changes; R - Mild glenohumeral and moderate AC joint degenerative changes    Patient Stated Goals "some direction on movement or motion  to do to prevent or alleviate the discomfort"    Currently in Pain? Yes    Pain Score 5     Pain Location Scapula    Pain Orientation Left    Pain Descriptors / Indicators Dull;Aching    Pain Type Acute pain    Pain Frequency Intermittent    Pain Score 5    Pain Location Neck    Pain Orientation Right    Pain Descriptors / Indicators Dull;Aching;Sharp    Pain Frequency Intermittent    Effect of Pain on Daily Activities "makes me want to turn my head slowly"                          Objective measurements completed on examination: See above findings.       [facility] Adult PT Treatment/Exercise - 11/10/20 1448      Exercises   Exercises Shoulder      Shoulder Exercises: ROM/Strengthening   UBE (Upper Arm Bike) L1.5 x 6 min (3' fwd/3' back)      Modalities   Modalities  Electrical engineer Stimulation Location B lower cervical, UT & medial scapular musculature    Electrical Stimulation Action TENS - SD1    Electrical Stimulation Parameters intensity to pt tol x 10'    Electrical Stimulation Goals Pain;Tone      Manual Therapy   Manual Therapy Soft tissue mobilization;Myofascial release    Manual therapy comments skilled palpation and monitoring during DN    Soft tissue mobilization STM/DTM to B UT, LS, scalenes & rhomboids    Myofascial Release manual TPR to B UT, LS, scalenes & L rhomboids            Trigger Point Dry Needling - 11/10/20 1448    Consent Given? Yes    Education Handout Provided Previously provided    Muscles Treated Head and Neck Upper trapezius;Levator scapulae;Scalenes   Bilateral   Muscles Treated Upper Quadrant Rhomboids   Lt   Upper Trapezius Response Twitch reponse elicited;Palpable increased muscle length    Levator Scapulae Response Twitch response elicited;Palpable increased muscle length    Scalenes Response Twitch reponse elicited;Palpable increased muscle length    Rhomboids Response Twitch response elicited;Palpable increased muscle length                PT Education - 11/10/20 1617    Education Details DN rational, procedures, outcomes, potential side effects, and recommended post-treatment exercises/activity; Instruction in TENS 7000 setup and use including electrode placement    Person(s) Educated Patient    Methods Explanation;Demonstration   handouts previously provided   Comprehension Verbalized understanding;Returned demonstration            PT Short Term Goals - 10/30/20 1111      PT SHORT TERM GOAL #1   Title Patient will be independent with initial HEP    Status Achieved   10/30/20            PT Long Term Goals - 11/03/20 1113      PT LONG TERM GOAL #1   Title Patient will be independent with ongoing/advanced HEP for self-management at home     Status On-going   11/03/20 - met for current HEP but limited tolerance for UT stretch   Target Date 12/15/20      PT LONG TERM GOAL #2   Title Improve posture and alignment with patient to demonstrate  improved upright posture with posterior shoulder girdle engaged    Status Partially Met   11/03/20 - pt notes improved awareness of posture but continues to need to frequently self-correct   Target Date 12/15/20      PT LONG TERM GOAL #3   Title Patient to improve B shoulder AROM to WNL without pain provocation    Status Partially Met   11/03/20 - B shoulder AROM WNL but pain still noted with L shoulder abduction & FER   Target Date 12/15/20      PT LONG TERM GOAL #4   Title Patient will demonstrate improved B shoulder strength to >/= 4+/5 w/o pain provocation for functional UE use    Status On-going    Target Date 12/15/20      PT LONG TERM GOAL #5   Title Patient to report ability to perform ADLs, household, and work-related tasks without increased pain    Status On-going   11/03/20 - pain is intermittent but can be limiting up to 60% when present   Target Date 12/15/20                  Plan - 11/10/20 1547    Clinical Impression Statement Laura Bush reports increased muscle tension and pain following her trip to [place] and expressed interest in proceeding with DN as previously discussed. After explanation of DN rational, procedures, outcomes and potential side effects, patient verbalized consent to DN treatment in conjunction with manual STM/DTM and TPR to reduce ttp/muscle tension. Muscles treated include B UT, LS, scalenes & L rhomboids. DN produced normal response with good twitches elicited resulting in palpable reduction in pain/ttp and muscle tension with pt noting immediate improvement. Pt educated to expect mild to moderate muscle soreness for up to 24-48 hrs and instructed to continue prescribed home exercise program and current activity level with pt verbalizing understanding of  theses instructions. Session concluded with training in setup and usage of her newly obtained home TENS unit at pt's request, including using skin marker to indicate recommended electrode placement to help when her husband assists with electrode placement at home.    Personal Factors and Comorbidities Time since onset of injury/illness/exacerbation;Past/Current Experience;Comorbidity 3+    Comorbidities L breast cancer with partial mastectomy 2016, B breast reduction surgery at time of mastectomy, h/o bone spur R shoulder, B CTR, R forearm fx, B knee scope with meniscal repair, HTN, osteopenia, bilateral BBB, GERD    Examination-Activity Limitations Reach Overhead    Examination-Participation Restrictions Cleaning;Laundry;Meal Prep;Shop    Rehab Potential Good    PT Frequency 2x / week    PT Duration 6 weeks    PT Treatment/Interventions ADLs/Self Care Home Management;Cryotherapy;Electrical Stimulation;Iontophoresis 54m/ml Dexamethasone;Moist Heat;Functional mobility training;Therapeutic activities;Therapeutic exercise;Neuromuscular re-education;Patient/family education;Manual techniques;Passive range of motion;Dry needling;Taping;Spinal Manipulations    PT Next Visit Plan assess response to DN; progress postural stretching and strengthening; manual therapy including DN and modalities PRN to address increased muscle tension/pain    PT Home Exercise Plan MedBridge Access Code: [email or web address](2/23), [email or web address] (3/11)    Consulted and Agree with Plan of Care Patient           Patient will benefit from skilled therapeutic intervention in order to improve the following deficits and impairments:  Pain,Postural dysfunction,Improper body mechanics,Increased fascial restricitons,Increased muscle spasms,Impaired flexibility,Decreased range of motion,Decreased strength,Decreased activity tolerance  Visit Diagnosis: Acute pain of left shoulder  Cervicalgia  Abnormal posture  Muscle weakness  (generalized)  Acute pain of right shoulder  Problem List Patient Active Problem List   Diagnosis Date Noted  . Metabolic syndrome 24/26/8341  . Mixed hyperlipidemia 04/21/2020  . Polyp of ascending colon 04/21/2020  . History of breast cancer, Tamoxifen, nearing five years, Dr. Griffith Citron 04/21/2020  . Morbid obesity ([place]) 09/30/2019  . Osteopenia 10/02/2018  . Malignant 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, estrogen receptor positive ([place]) 05/29/2017  . 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 Vitamin D deficiency 01/29/2016  . Eczema 12/29/2014  . Venous insufficiency 10/16/2014  . Essential hypertension 10/01/2014  . Hypercholesteremia 10/01/2014  . GERD (gastroesophageal reflux disease) 10/01/2014  . Other bilateral bundle branch block 06/23/2003    Percival Spanish, PT, MPT 11/10/2020, 4:47 PM  [facility] [address]  [place], [place], [address] Phone: [phone]   Fax:  [phone]  Name: Laura Bush MRN: [id number] Date of Birth: [birth year]

## 2020-11-11 ENCOUNTER — Ambulatory Visit: Payer: Medicare HMO | Admitting: Cardiovascular Disease

## 2020-11-11 ENCOUNTER — Encounter: Payer: Self-pay | Admitting: Cardiovascular Disease

## 2020-11-11 VITALS — BP 112/66 | HR 63 | Ht 59.0 in | Wt 175.6 lb

## 2020-11-11 DIAGNOSIS — E7800 Pure hypercholesterolemia, unspecified: Secondary | ICD-10-CM

## 2020-11-11 DIAGNOSIS — E669 Obesity, unspecified: Secondary | ICD-10-CM

## 2020-11-11 DIAGNOSIS — I10 Essential (primary) hypertension: Secondary | ICD-10-CM | POA: Diagnosis not present

## 2020-11-11 HISTORY — DX: Obesity, unspecified: E66.9

## 2020-11-11 NOTE — Patient Instructions (Signed)
Medication Instructions:  Your physician recommends that you continue on your current medications as directed. Please refer to the Current Medication list given to you today.   *If you need a refill on your cardiac medications before your next appointment, please call your pharmacy*  Lab Work: NONE   Testing/Procedures: NONE   Follow-Up: At [facility], you and your health needs are our priority.  As part of our continuing mission to provide you with exceptional heart care, we have created designated Provider Care Teams.  These Care Teams include your primary Cardiologist (physician) and Advanced Practice Providers (APPs -  Physician Assistants and Nurse Practitioners) who all work together to provide you with the care you need, when you need it.  We recommend signing up for the patient portal called "MyChart".  Sign up information is provided on this After Visit Summary.  MyChart is used to connect with patients for Virtual Visits (Telemedicine).  Patients are able to view lab/test results, encounter notes, upcoming appointments, etc.  Non-urgent messages can be sent to your provider as well.   To learn more about what you can do with MyChart, go to [email or web address].    Your next appointment:   12 month(s)  The format for your next appointment:   In Person  Provider:   DR Laurel

## 2020-11-11 NOTE — Progress Notes (Signed)
Cardiology Office Note   Date:  11/11/2020   ID:  Laura Bush, DOB 05-03-1951, MRN [id number]  PCP:  Darreld Mclean, MD  Cardiologist:  No primary care provider on file.  Electrophysiologist:  None   Evaluation Performed:  Follow-Up Visit  Chief Complaint:  Weight gain  History of Present Illness:    Laura Bush is a 70 y.o. female breast cancer s/p lumpectomy and XRT, GERD, hypertension and hyperlipidemia who presents for follow up.  She was seen 04/2016 with a report of progressive exertional dyspnea. It was especially prominent when walking up an incline. She was referred for an exercise Myoview that was negative for ischemia and revealed LVEF 71%.  She also noted that her heart rate only gets into the 90s with exertion.  Metoprolol was switched to carvedilol. She followed up with our pharmacist in her blood pressure was well-controlled. However amlodipine was discontinued due to lower extremity edema.    HCTZ was increased due to poorly controlled blood pressure.  [name] switched to a plant-based diet in September 2020.    She was frustrated by her lack of weight loss and was referred to the healthy weight and wellness clinic where she has already lost over 25 pounds.  Her weight loss has plateaued recently.  She attributes it to having an upper respiratory infection last month.  She was tested multiple times for Covid-19 and was negative.  She had profound fatigue and feels as though she is just not covering.  She also was not as active during the winter but has already started back walking regularly with her husband.  They are able to walk multiple miles and she feels great as long as she is on [place].  She gets short of breath if she walks up inclines.  She had some lower extremity edema after recent car ride to and from [place].  This has improved.  She denies orthopnea or PND.  She is struggled with shoulder pain and was on some anti-inflammatories which  spiked her blood pressure.  This seems to have improved.  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  Past Medical History:  Diagnosis Date  . Asthma    in cold weather  . Breast cancer ([place]) 09/18/14   left breast  . Breast cancer ([place]) 10/07/14   bx left breast  . Breast cancer of upper-outer quadrant of left female breast ([place]) 09/22/2014  . Cluster headaches    in her 40's  . GERD (gastroesophageal reflux disease)   . Heart murmur    as a child (has outgrown)  . High cholesterol   . Hypertension   . Lower extremity edema   . Microscopic colitis   . Morbid obesity ([place]) 09/30/2019  . Obesity (BMI 30-39.9) 11/11/2020  . OSA on CPAP   . Personal history of radiation therapy   . Pneumonia 2000's X 1  . PONV (postoperative nausea and vomiting)   . Rosacea   . S/P radiation therapy 12/30/14-01/27/15   left breast 50Gy total dose  . 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 Swallowing difficulty   . Venous insufficiency    Past Surgical History:  Procedure Laterality Date  . ABDOMINAL HYSTERECTOMY  1999  . APPENDECTOMY  ~ 2006  . BREAST BIOPSY Left 10/2014  . BREAST LUMPECTOMY Left 2016  . BREAST REDUCTION SURGERY Bilateral 11/11/2014   Procedure: Bilateral Breast Reduction;  Surgeon: Crissie Reese, MD;  Location: [facility];  Service: Clinical cytogeneticist;  Laterality: Bilateral;  . CARPAL TUNNEL RELEASE Bilateral    2 surgeries on right, 1 on left  . CESAREAN SECTION  1978; 1980  . COLONOSCOPY    . FOREARM FRACTURE SURGERY Right 2003 X 3  . KNEE ARTHROSCOPY Bilateral    meniscus repair  . PARTIAL MASTECTOMY WITH NEEDLE LOCALIZATION AND AXILLARY SENTINEL LYMPH NODE BX Left 11/11/2014   Procedure: LEFT BREAST PARTIAL MASTECTOMY WITH NEEDLE LOCALIZATION TIMES TWO AND LEFT AXILLARY SENTINEL LYMPH NODE Biopsy;  Surgeon: Fanny Skates, MD;  Location: [facility];  Service: General;  Laterality: Left;  . SQUAMOUS CELL CARCINOMA EXCISION  1980's X 1   "nose"  . TONSILLECTOMY  ~ 1957  .  TUBAL LIGATION  ?1984     Current Meds  Medication Sig  . ACETAMINOPHEN PO Take 650 mg by mouth 2 (two) times daily as needed (for shoulder and other mild to moderate pain).  Marland Kitchen aMILoride (MIDAMOR) 5 MG tablet Take 1 tablet (5 mg total) by mouth daily.  . B Complex-C (B-COMPLEX WITH VITAMIN C) tablet Take 1 tablet by mouth daily.  Marland Kitchen CALCIUM CARBONATE-VITAMIN D PO Take 1 tablet by mouth 2 (two) times daily. 600mg  calcium, D3 (1000)IU  . carvedilol (COREG) 6.25 MG tablet Take 6.25 mg by mouth 2 (two) times daily with a meal.  . Elderberry 575 MG/5ML SYRP Take by mouth daily.  Marland Kitchen Fexofenadine HCl (ALLEGRA PO) Take 1 tablet by mouth daily as needed.  . hydrochlorothiazide (MICROZIDE) 12.5 MG capsule Take 1 capsule (12.5 mg total) by mouth daily.  Marland Kitchen ibuprofen (ADVIL) 200 MG tablet Take 400 mg by mouth daily as needed (for pain not relieved by Tylenol / acetaminophen).  . metFORMIN (GLUCOPHAGE) 500 MG tablet Take 1 tablet (500 mg total) by mouth 2 (two) times daily.  . pantoprazole (PROTONIX) 40 MG tablet Take 1 tablet (40 mg total) by mouth daily.  . simvastatin (ZOCOR) 20 MG tablet Take 1 tablet (20 mg total) by mouth daily.  Marland Kitchen telmisartan (MICARDIS) 80 MG tablet Take 1 tablet (80 mg total) by mouth daily.     Allergies:   Amlodipine, Diclofenac, Lanolin, and Tape   Social History   Tobacco Use  . Smoking status: Never Smoker  . Smokeless tobacco: Never Used  Substance Use Topics  . Alcohol use: Yes    Alcohol/week: 5.0 standard drinks    Types: 5 Glasses of wine per week  . Drug use: No     Family Hx: The patient's family history includes Dementia in her mother; Heart attack in her maternal grandmother; Heart failure in her father; Hyperlipidemia in her mother; Hypertension in her father, maternal grandfather, and mother; Stroke in her father and paternal grandfather; Uterine cancer (age of onset: 5) in her sister. There is no history of Colon cancer, Esophageal cancer, Pancreatic  cancer, Stomach cancer, or Liver disease.  ROS:   Please see the history of present illness.     All other systems reviewed and are negative.   Prior CV studies:   The following studies were reviewed today:  Exercise Myoview 05/11/16:   The left ventricular ejection fraction is hyperdynamic (>65%).  Nuclear stress EF: 71%.  Blood pressure demonstrated a normal response to exercise.  There was no ST segment deviation noted during stress.  The study is normal.  Normal stress nuclear study with no ischemia or infarction; EF 71 with normal wall motion.  Unfortunately urine had to ask them for loaner something do not have loaner  computers I will have it ready I will have the  Labs/Other Tests and Data Reviewed:    EKG:  An ECG dated 11/11/2020 was personally reviewed today and demonstrated:  Sinus rhythm.  Rate 63 bpm.  LAFB.  Recent Labs: 06/16/2020: ALT 21; Hemoglobin 12.5; Platelet Count 178 10/05/2020: BUN 19; Creatinine, Ser 0.72; Potassium 4.5; Sodium 141; TSH 0.88   Recent Lipid Panel Lab Results  Component Value Date/Time   CHOL 156 10/05/2020 10:59 AM   TRIG 182.0 (H) 10/05/2020 10:59 AM   HDL 54.30 10/05/2020 10:59 AM   CHOLHDL 3 10/05/2020 10:59 AM   LDLCALC 66 10/05/2020 10:59 AM   LDLCALC 70 04/06/2020 10:59 AM    Wt Readings from Last 3 Encounters:  11/11/20 175 lb 9.6 oz (79.7 kg)  10/29/20 174 lb 9.6 oz (79.2 kg)  10/23/20 170 lb (77.1 kg)     Objective:    VS:  BP 112/66   Pulse 63   Ht 4\' 11"  (1.499 m)   Wt 175 lb 9.6 oz (79.7 kg)   BMI 35.47 kg/m  , BMI Body mass index is 35.47 kg/m. GENERAL:  Well appearing HEENT: Pupils equal round and reactive, fundi not visualized, oral mucosa unremarkable NECK:  No jugular venous distention, waveform within normal limits, carotid upstroke brisk and symmetric, no bruits LUNGS:  Clear to auscultation bilaterally HEART:  RRR.  PMI not displaced or sustained,S1 and S2 within normal limits, no S3, no S4, no  clicks, no rubs, no murmurs ABD:  Flat, positive bowel sounds normal in frequency in pitch, no bruits, no rebound, no guarding, no midline pulsatile mass, no hepatomegaly, no splenomegaly EXT:  2 plus pulses throughout, no edema, no cyanosis no clubbing SKIN:  No rashes no nodules NEURO:  Cranial nerves II through XII grossly intact, motor grossly intact throughout PSYCH:  Cognitively intact, oriented to person place and time  ASSESSMENT & PLAN:    # Obesity: [name] has done a great job with the healthy weight and wellness clinic.  She was congratulated on her weight loss efforts.  Continue working with them and start back exercising, as she has already done.    # Exertional dyspnea:   Improving with weight loss and exercise.  # Hypertension: Blood pressure is well-controlled on carvedilol, hydrochlorothiazide, and telmisartan.  She continues to lose weight and her blood pressure comes down she will try to cut back on the carvedilol.  # Hyperlipidemia: #Hypertriglyceridemia: Continue simvastatin, diet, and exercise.  LDL 70 on 03/2020.  Triglycerides were 210.   Medication Adjustments/Labs and Tests Ordered: Current medicines are reviewed at length with the patient today.  Concerns regarding medicines are outlined above.   Tests Ordered: Orders Placed This Encounter  Procedures  . EKG 12-Lead    Medication Changes: No orders of the defined types were placed in this encounter.   Follow Up:  In Person in 1 year(s)  Signed, Skeet Latch, MD  11/11/2020 10:18 AM    [place]

## 2020-11-13 ENCOUNTER — Other Ambulatory Visit: Payer: Self-pay

## 2020-11-13 ENCOUNTER — Ambulatory Visit: Payer: Medicare HMO

## 2020-11-13 DIAGNOSIS — R293 Abnormal posture: Secondary | ICD-10-CM | POA: Diagnosis not present

## 2020-11-13 DIAGNOSIS — M25512 Pain in left shoulder: Secondary | ICD-10-CM

## 2020-11-13 DIAGNOSIS — M6281 Muscle weakness (generalized): Secondary | ICD-10-CM | POA: Diagnosis not present

## 2020-11-13 DIAGNOSIS — M25511 Pain in right shoulder: Secondary | ICD-10-CM | POA: Diagnosis not present

## 2020-11-13 DIAGNOSIS — M542 Cervicalgia: Secondary | ICD-10-CM

## 2020-11-13 NOTE — Therapy (Signed)
[facility] [address]  [place], [place], [address] Phone: [phone]   Fax:  [phone]  Physical Therapy Treatment  Patient Details  Name: Laura Bush MRN: [id number] Date of Birth: [birth year] Referring Provider (PT): Gregor Hams, MD   Encounter Date: 11/13/2020   PT End of Session - 11/13/20 0853    Visit Number 7    Number of Visits 17    Date for PT Re-Evaluation 12/15/20    Authorization Type [employer]    Progress Note Due on Visit 15   Recert on visit #5 - 5/43/60   PT Start Time 0850    PT Stop Time 0930    PT Time Calculation (min) 40 min    Activity Tolerance Patient tolerated treatment well    Behavior During Therapy [facility] for tasks assessed/performed           Past Medical History:  Diagnosis Date  . Asthma    in cold weather  . Breast cancer ([facility]) 09/18/14   left breast  . Breast cancer ([facility]) 10/07/14   bx left breast  . Breast cancer of upper-outer quadrant of left female breast ([facility]) 09/22/2014  . Cluster headaches    in her 40's  . GERD (gastroesophageal reflux disease)   . Heart murmur    as a child (has outgrown)  . High cholesterol   . Hypertension   . Lower extremity edema   . Microscopic colitis   . Morbid obesity ([facility]) 09/30/2019  . Obesity (BMI 30-39.9) 11/11/2020  . OSA on CPAP   . Personal history of radiation therapy   . Pneumonia 2000's X 1  . PONV (postoperative nausea and vomiting)   . Rosacea   . S/P radiation therapy 12/30/14-01/27/15   left breast 50Gy total dose  . 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 Swallowing difficulty   . Venous insufficiency     Past Surgical History:  Procedure Laterality Date  . ABDOMINAL HYSTERECTOMY  1999  . APPENDECTOMY  ~ 2006  . BREAST BIOPSY Left 10/2014  . BREAST LUMPECTOMY Left 2016  . BREAST REDUCTION SURGERY Bilateral 11/11/2014   Procedure: Bilateral Breast Reduction;  Surgeon: Crissie Reese, MD;  Location: [facility];  Service: Plastics;  Laterality: Bilateral;  . CARPAL TUNNEL RELEASE Bilateral    2 surgeries on right, 1 on left  . CESAREAN SECTION  1978; 1980  . COLONOSCOPY    . FOREARM FRACTURE SURGERY Right 2003 X 3  . KNEE ARTHROSCOPY Bilateral    meniscus repair  . PARTIAL MASTECTOMY WITH NEEDLE LOCALIZATION AND AXILLARY SENTINEL LYMPH NODE BX Left 11/11/2014   Procedure: LEFT BREAST PARTIAL MASTECTOMY WITH NEEDLE LOCALIZATION TIMES TWO AND LEFT AXILLARY SENTINEL LYMPH NODE Biopsy;  Surgeon: Fanny Skates, MD;  Location: [facility];  Service: General;  Laterality: Left;  . SQUAMOUS CELL CARCINOMA EXCISION  1980's X 1   "nose"  . TONSILLECTOMY  ~ 1957  . TUBAL LIGATION  ?1984    There were no vitals filed for this visit.   Subjective Assessment - 11/13/20 0852    Subjective Pt reports she dry needling was so helpful. "For 36 hours, my shoulder felt relaxed. It's overall less sore, achy, and tender".    Diagnostic tests 09/17/20 - shoulder x-rays: L - Mild AC joint degenerative  changes; R - Mild glenohumeral and moderate AC joint degenerative changes    Patient Stated Goals "some direction on movement or motion  to do to prevent or alleviate the discomfort"    Currently in Pain? Yes    Pain Score 2     Pain Location Shoulder    Pain Orientation Left    Pain Descriptors / Indicators Dull;Aching;Sore                             [facility] Adult PT Treatment/Exercise - 11/13/20 0001      Shoulder Exercises: Supine   Other Supine Exercises T/S ext over FR 2x10      Shoulder Exercises: Sidelying   Other Sidelying Exercises L thoracic rotation x10 with OP      Shoulder Exercises: Standing   Other Standing Exercises FR thoracic rotation: thread the needle      Shoulder Exercises: ROM/Strengthening   UBE (Upper Arm Bike) L1.5 x 6 min (3' fwd/3' back)    Lat Pull 15 reps    Lat Pull Limitations 20#    Cybex Row 15 reps    Cybex Row Limitations 15# intermittent VCs/TCs for  scapular downward rotation and UT relaxation      Manual Therapy   Manual Therapy Soft tissue mobilization;Myofascial release;Joint mobilization;Passive ROM    Joint Mobilization inf/posterior GHJ glides    Soft tissue mobilization STM/DTM to B UT, LS, scalenes & rhomboids    Myofascial Release manual TPR to B UT, LS, scalenes & L rhomboids    Passive ROM all planes with OP into flexion, passive upper trap stretching                    PT Short Term Goals - 10/30/20 1111      PT SHORT TERM GOAL #1   Title Patient will be independent with initial HEP    Status Achieved   10/30/20            PT Long Term Goals - 11/03/20 1113      PT LONG TERM GOAL #1   Title Patient will be independent with ongoing/advanced HEP for self-management at home    Status On-going   11/03/20 - met for current HEP but limited tolerance for UT stretch   Target Date 12/15/20      PT LONG TERM GOAL #2   Title Improve posture and alignment with patient to demonstrate improved upright posture with posterior shoulder girdle engaged    Status Partially Met   11/03/20 - pt notes improved awareness of posture but continues to need to frequently self-correct   Target Date 12/15/20      PT LONG TERM GOAL #3   Title Patient to improve B shoulder AROM to WNL without pain provocation    Status Partially Met   11/03/20 - B shoulder AROM WNL but pain still noted with L shoulder abduction & FER   Target Date 12/15/20      PT LONG TERM GOAL #4   Title Patient will demonstrate improved B shoulder strength to >/= 4+/5 w/o pain provocation for functional UE use    Status On-going    Target Date 12/15/20      PT LONG TERM GOAL #5   Title Patient to report ability to perform ADLs, household, and work-related tasks without increased pain    Status On-going   11/03/20 - pain is intermittent but can be limiting up to 60% when present   Target Date 12/15/20  Plan - 11/13/20 0853    Clinical  Impression Statement Pt responded well to manual therapy and therapeutic exercise to mobilize thoracic spine, release anterior shoulder and UT/LS, and set shoulders with rows and LAT PD. She reported some soreness in L UT that dissipated throughout session. She will continue to benefit from skilled interventions to address soft tissue restrictions and weakness.    Personal Factors and Comorbidities Time since onset of injury/illness/exacerbation;Past/Current Experience;Comorbidity 3+    Comorbidities L breast cancer with partial mastectomy 2016, B breast reduction surgery at time of mastectomy, h/o bone spur R shoulder, B CTR, R forearm fx, B knee scope with meniscal repair, HTN, osteopenia, bilateral BBB, GERD    Examination-Activity Limitations Reach Overhead    Examination-Participation Restrictions Cleaning;Laundry;Meal Prep;Shop    Rehab Potential Good    PT Frequency 2x / week    PT Duration 6 weeks    PT Treatment/Interventions ADLs/Self Care Home Management;Cryotherapy;Electrical Stimulation;Iontophoresis 47m/ml Dexamethasone;Moist Heat;Functional mobility training;Therapeutic activities;Therapeutic exercise;Neuromuscular re-education;Patient/family education;Manual techniques;Passive range of motion;Dry needling;Taping;Spinal Manipulations    PT Next Visit Plan assess response to DN; progress postural stretching and strengthening; manual therapy including DN and modalities PRN to address increased muscle tension/pain    PT Home Exercise Plan MedBridge Access Code: [email or web address](2/23), [email or web address] (3/11)    Consulted and Agree with Plan of Care Patient           Patient will benefit from skilled therapeutic intervention in order to improve the following deficits and impairments:  Pain,Postural dysfunction,Improper body mechanics,Increased fascial restricitons,Increased muscle spasms,Impaired flexibility,Decreased range of motion,Decreased strength,Decreased activity tolerance  Visit  Diagnosis: Acute pain of left shoulder  Cervicalgia  Abnormal posture  Muscle weakness (generalized)  Acute pain of right shoulder     Problem List Patient Active Problem List   Diagnosis Date Noted  . Obesity (BMI 30-39.9) 11/11/2020  . Metabolic syndrome 038/18/2993 . Mixed hyperlipidemia 04/21/2020  . Polyp of ascending colon 04/21/2020  . History of breast cancer, Tamoxifen, nearing five years, Dr. MGriffith Citron09/14/2021  . Osteopenia 10/02/2018  . Malignant 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, estrogen receptor positive ([place] 05/29/2017  . 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 Vitamin D deficiency 01/29/2016  . Eczema 12/29/2014  . Venous insufficiency 10/16/2014  . Essential hypertension 10/01/2014  . Hypercholesteremia 10/01/2014  . GERD (gastroesophageal reflux disease) 10/01/2014  . Other bilateral bundle branch block 06/23/2003    KIzell [name] PT, DPT 11/13/2020, 9:31 AM  [facility] [place] [address]Phone: [phone]  Fax:  [phone] Name: Laura Watson JMartiniqueMRN: [id number] of Birth: 802/11/1950

## 2020-11-17 ENCOUNTER — Ambulatory Visit: Payer: Medicare HMO

## 2020-11-17 ENCOUNTER — Other Ambulatory Visit: Payer: Self-pay

## 2020-11-17 ENCOUNTER — Encounter: Payer: Self-pay | Admitting: Family Medicine

## 2020-11-17 DIAGNOSIS — R293 Abnormal posture: Secondary | ICD-10-CM

## 2020-11-17 DIAGNOSIS — M542 Cervicalgia: Secondary | ICD-10-CM

## 2020-11-17 DIAGNOSIS — M6281 Muscle weakness (generalized): Secondary | ICD-10-CM

## 2020-11-17 DIAGNOSIS — M25512 Pain in left shoulder: Secondary | ICD-10-CM | POA: Diagnosis not present

## 2020-11-17 DIAGNOSIS — M25511 Pain in right shoulder: Secondary | ICD-10-CM | POA: Diagnosis not present

## 2020-11-17 NOTE — Therapy (Signed)
[facility] [address]  [place], [place], [address] Phone: [phone]   Fax:  [phone]  Physical Therapy Treatment  Patient Details  Name: Laura Bush MRN: [id number] Date of Birth: 05/24/1951 Referring Provider (PT): Gregor Hams, MD   Encounter Date: 11/17/2020   PT End of Session - 11/17/20 1058    Visit Number 8    Number of Visits 17    Date for PT Re-Evaluation 12/15/20    Authorization Type [employer]    Progress Note Due on Visit 15    PT Start Time 1015    PT Stop Time 1057    PT Time Calculation (min) 42 min           Past Medical History:  Diagnosis Date  . Asthma    in cold weather  . Breast cancer ([place]) 09/18/14   left breast  . Breast cancer ([place]) 10/07/14   bx left breast  . Breast cancer of upper-outer quadrant of left female breast ([place]) 09/22/2014  . Cluster headaches    in her 40's  . GERD (gastroesophageal reflux disease)   . Heart murmur    as a child (has outgrown)  . High cholesterol   . Hypertension   . Lower extremity edema   . Microscopic colitis   . Morbid obesity ([place]) 09/30/2019  . Obesity (BMI 30-39.9) 11/11/2020  . OSA on CPAP   . Personal history of radiation therapy   . Pneumonia 2000's X 1  . PONV (postoperative nausea and vomiting)   . Rosacea   . S/P radiation therapy 12/30/14-01/27/15   left breast 50Gy total dose  . 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 Swallowing difficulty   . Venous insufficiency     Past Surgical History:  Procedure Laterality Date  . ABDOMINAL HYSTERECTOMY  1999  . APPENDECTOMY  ~ 2006  . BREAST BIOPSY Left 10/2014  . BREAST LUMPECTOMY Left 2016  . BREAST REDUCTION SURGERY Bilateral 11/11/2014   Procedure: Bilateral Breast Reduction;  Surgeon: Crissie Reese, MD;  Location: [facility];  Service: Plastics;  Laterality: Bilateral;  . CARPAL TUNNEL RELEASE Bilateral    2 surgeries on right, 1 on left  . CESAREAN SECTION   1978; 1980  . COLONOSCOPY    . FOREARM FRACTURE SURGERY Right 2003 X 3  . KNEE ARTHROSCOPY Bilateral    meniscus repair  . PARTIAL MASTECTOMY WITH NEEDLE LOCALIZATION AND AXILLARY SENTINEL LYMPH NODE BX Left 11/11/2014   Procedure: LEFT BREAST PARTIAL MASTECTOMY WITH NEEDLE LOCALIZATION TIMES TWO AND LEFT AXILLARY SENTINEL LYMPH NODE Biopsy;  Surgeon: Fanny Skates, MD;  Location: [facility];  Service: General;  Laterality: Left;  . SQUAMOUS CELL CARCINOMA EXCISION  1980's X 1   "nose"  . TONSILLECTOMY  ~ 1957  . TUBAL LIGATION  ?1984    There were no vitals filed for this visit.   Subjective Assessment - 11/17/20 1017    Subjective Pt reports still having trigger points in her upper shoulder.    Diagnostic tests 09/17/20 - shoulder x-rays: L - Mild AC joint degenerative  changes; R - Mild glenohumeral and moderate AC joint degenerative changes    Patient Stated Goals "some direction on movement or motion to do to prevent or alleviate the discomfort"    Currently in Pain? Yes    Pain Score 2     Pain Location Shoulder    Pain Orientation Left    Pain  Descriptors / Indicators Dull;Sore    Pain Type Acute pain                             [facility] Adult PT Treatment/Exercise - 11/17/20 0001      Shoulder Exercises: Standing   Other Standing Exercises thoracic rotation with back against wall 10 reps each side      Shoulder Exercises: ROM/Strengthening   UBE (Upper Arm Bike) L1.5 x 6 min (3' fwd/3' back)    Lat Pull 10 reps    Lat Pull Limitations 20#    Cybex Row 20 reps    Cybex Row Limitations 15#, narrow and wide grip 10 reps each    Other ROM/Strengthening Exercises Y and Ts with back against wall 10 reps each      Manual Therapy   Manual Therapy Soft tissue mobilization;Myofascial release;Joint mobilization;Passive ROM    Soft tissue mobilization STM/DTM to L UT, LS, scalenes    Myofascial Release manual TPR to L UT, LS, scalenes      Neck Exercises: Stretches    Upper Trapezius Stretch 2 reps;30 seconds;Left    Other Neck Stretches B cervical rotation 10x3"                    PT Short Term Goals - 10/30/20 1111      PT SHORT TERM GOAL #1   Title Patient will be independent with initial HEP    Status Achieved   10/30/20            PT Long Term Goals - 11/17/20 1147      PT LONG TERM GOAL #1   Title Patient will be independent with ongoing/advanced HEP for self-management at home    Status On-going   11/03/20 - met for current HEP but limited tolerance for UT stretch     PT LONG TERM GOAL #2   Title Improve posture and alignment with patient to demonstrate improved upright posture with posterior shoulder girdle engaged    Status Partially Met   11/03/20 - pt notes improved awareness of posture but continues to need to frequently self-correct     PT LONG TERM GOAL #3   Title Patient to improve B shoulder AROM to WNL without pain provocation    Status Partially Met   11/03/20 - B shoulder AROM WNL but pain still noted with L shoulder abduction & FER     PT LONG TERM GOAL #4   Title Patient will demonstrate improved B shoulder strength to >/= 4+/5 w/o pain provocation for functional UE use    Status On-going      PT LONG TERM GOAL #5   Title Patient to report ability to perform ADLs, household, and work-related tasks without increased pain    Status On-going   11/03/20 - pain is intermittent but can be limiting up to 60% when present                Plan - 11/17/20 1059    Clinical Impression Statement Pt continues with tightness and tenderness in L UT, LS, and scalenes, good response during MT with increased tissue extensibility and decreased tender points. Pt demonstrated good performance of exercises, with min need for cueing. She shows increased awareness of posture keeping L shoulder down and back but still needing some cues during exercises. Some reports of increased pulling in upper shoulders during thoracic rotations  but all exercises were tolerable.  Personal Factors and Comorbidities Time since onset of injury/illness/exacerbation;Past/Current Experience;Comorbidity 3+    Comorbidities L breast cancer with partial mastectomy 2016, B breast reduction surgery at time of mastectomy, h/o bone spur R shoulder, B CTR, R forearm fx, B knee scope with meniscal repair, HTN, osteopenia, bilateral BBB, GERD    PT Frequency 2x / week    PT Duration 6 weeks    PT Treatment/Interventions ADLs/Self Care Home Management;Cryotherapy;Electrical Stimulation;Iontophoresis 102m/ml Dexamethasone;Moist Heat;Functional mobility training;Therapeutic activities;Therapeutic exercise;Neuromuscular re-education;Patient/family education;Manual techniques;Passive range of motion;Dry needling;Taping;Spinal Manipulations    PT Next Visit Plan assess response to DN; progress postural stretching and strengthening; manual therapy including DN and modalities PRN to address increased muscle tension/pain    PT Home Exercise Plan MedBridge Access Code: [email or web address](2/23), [email or web address] (3/11)    Consulted and Agree with Plan of Care Patient           Patient will benefit from skilled therapeutic intervention in order to improve the following deficits and impairments:  Pain,Postural dysfunction,Improper body mechanics,Increased fascial restricitons,Increased muscle spasms,Impaired flexibility,Decreased range of motion,Decreased strength,Decreased activity tolerance  Visit Diagnosis: Acute pain of left shoulder  Cervicalgia  Abnormal posture  Muscle weakness (generalized)     Problem List Patient Active Problem List   Diagnosis Date Noted  . Obesity (BMI 30-39.9) 11/11/2020  . Metabolic syndrome 078/58/8502 . Mixed hyperlipidemia 04/21/2020  . Polyp of ascending colon 04/21/2020  . History of breast cancer, Tamoxifen, nearing five years, Dr. MGriffith Citron09/14/2021  . Osteopenia 10/02/2018  . Malignant neoplasm of overlapping sites of left  breast in female, estrogen receptor positive ([place] [place] 05/29/2017  . 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 Vitamin D deficiency 01/29/2016  . Eczema 12/29/2014  . Venous insufficiency 10/16/2014  . Essential hypertension 10/01/2014  . Hypercholesteremia 10/01/2014  . GERD (gastroesophageal reflux disease) 10/01/2014  . Other bilateral bundle branch block 06/23/2003    BArtist Pais PTA 11/17/2020, 11:56 AM  [facility] [place] [address]Phone: [phone]  Fax:  [phone] Name: Laura Watson JMartiniqueMRN: [id number] of Birth: 810-11-1950

## 2020-11-20 ENCOUNTER — Other Ambulatory Visit: Payer: Self-pay

## 2020-11-20 ENCOUNTER — Ambulatory Visit: Payer: Medicare HMO

## 2020-11-20 DIAGNOSIS — M25512 Pain in left shoulder: Secondary | ICD-10-CM

## 2020-11-20 DIAGNOSIS — M542 Cervicalgia: Secondary | ICD-10-CM

## 2020-11-20 DIAGNOSIS — M25511 Pain in right shoulder: Secondary | ICD-10-CM | POA: Diagnosis not present

## 2020-11-20 DIAGNOSIS — R293 Abnormal posture: Secondary | ICD-10-CM | POA: Diagnosis not present

## 2020-11-20 DIAGNOSIS — M6281 Muscle weakness (generalized): Secondary | ICD-10-CM

## 2020-11-20 NOTE — Therapy (Signed)
[facility] [address]  [place], [place], [address] Phone: [phone]   Fax:  [phone]  Physical Therapy Treatment  Patient Details  Name: Laura Bush MRN: [id number] Date of Birth: 1950-11-19 Referring Provider (PT): Gregor Hams, MD   Encounter Date: 11/20/2020   PT End of Session - 11/20/20 1014    Visit Number 9    Number of Visits 17    Date for PT Re-Evaluation 12/15/20    Authorization Type [employer] Medicare    Progress Note Due on Visit 15    PT Start Time 0934    PT Stop Time 1013    PT Time Calculation (min) 39 min    Activity Tolerance Patient tolerated treatment well    Behavior During Therapy [facility] for tasks assessed/performed           Past Medical History:  Diagnosis Date  . Asthma    in cold weather  . Breast cancer ([place]) 09/18/14   left breast  . Breast cancer ([place]) 10/07/14   bx left breast  . Breast cancer of upper-outer quadrant of left female breast ([facility]) 09/22/2014  . Cluster headaches    in her 40's  . GERD (gastroesophageal reflux disease)   . Heart murmur    as a child (has outgrown)  . High cholesterol   . Hypertension   . Lower extremity edema   . Microscopic colitis   . Morbid obesity ([place]) 09/30/2019  . Obesity (BMI 30-39.9) 11/11/2020  . OSA on CPAP   . Personal history of radiation therapy   . Pneumonia 2000's X 1  . PONV (postoperative nausea and vomiting)   . Rosacea   . S/P radiation therapy 12/30/14-01/27/15   left breast 50Gy total dose  . 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 Swallowing difficulty   . Venous insufficiency     Past Surgical History:  Procedure Laterality Date  . ABDOMINAL HYSTERECTOMY  1999  . APPENDECTOMY  ~ 2006  . BREAST BIOPSY Left 10/2014  . BREAST LUMPECTOMY Left 2016  . BREAST REDUCTION SURGERY Bilateral 11/11/2014   Procedure: Bilateral Breast Reduction;  Surgeon: Crissie Reese, MD;  Location: [facility];  Service: Plastics;   Laterality: Bilateral;  . CARPAL TUNNEL RELEASE Bilateral    2 surgeries on right, 1 on left  . CESAREAN SECTION  1978; 1980  . COLONOSCOPY    . FOREARM FRACTURE SURGERY Right 2003 X 3  . KNEE ARTHROSCOPY Bilateral    meniscus repair  . PARTIAL MASTECTOMY WITH NEEDLE LOCALIZATION AND AXILLARY SENTINEL LYMPH NODE BX Left 11/11/2014   Procedure: LEFT BREAST PARTIAL MASTECTOMY WITH NEEDLE LOCALIZATION TIMES TWO AND LEFT AXILLARY SENTINEL LYMPH NODE Biopsy;  Surgeon: Fanny Skates, MD;  Location: [facility];  Service: General;  Laterality: Left;  . SQUAMOUS CELL CARCINOMA EXCISION  1980's X 1   "nose"  . TONSILLECTOMY  ~ 1957  . TUBAL LIGATION  ?1984    There were no vitals filed for this visit.   Subjective Assessment - 11/20/20 0936    Subjective Pt reports she is doing a lot better today with less tightness in upper shoulder and minimum pain.    Diagnostic tests 09/17/20 - shoulder x-rays: L - Mild AC joint degenerative  changes; R - Mild glenohumeral and moderate AC joint degenerative changes    Patient Stated Goals "some direction on movement or motion to do to prevent or alleviate the discomfort"  Currently in Pain? Yes    Pain Score 1     Pain Location Shoulder    Pain Orientation Left    Pain Descriptors / Indicators Dull;Sore    Pain Type Acute pain              [facility] PT Assessment - 11/20/20 0001      AROM   AROM Assessment Site Shoulder    Right/Left Shoulder Left;Right    Right Shoulder Flexion 160 Degrees    Right Shoulder ABduction 175 Degrees    Left Shoulder Flexion 139 Degrees    Left Shoulder ABduction 134 Degrees      Strength   Right Shoulder Flexion 4+/5    Right Shoulder ABduction 4+/5    Right Shoulder Internal Rotation 4+/5    Right Shoulder External Rotation 4+/5    Left Shoulder Flexion 4+/5    Left Shoulder ABduction 4/5    Left Shoulder Internal Rotation 4+/5    Left Shoulder External Rotation 4+/5                         [facility]  Adult PT Treatment/Exercise - 11/20/20 0001      Shoulder Exercises: Standing   Horizontal ABduction Strengthening;Both;10 reps;Theraband    Theraband Level (Shoulder Horizontal ABduction) Level 1 (Yellow)    Flexion Strengthening;Left;10 reps;Theraband    Theraband Level (Shoulder Flexion) Level 1 (Yellow)    ABduction Strengthening;Left;10 reps;Theraband    Theraband Level (Shoulder ABduction) Level 1 (Yellow)      Shoulder Exercises: ROM/Strengthening   UBE (Upper Arm Bike) L2 x 6 min (3' fwd/3' back)    Lat Pull 10 reps    Lat Pull Limitations 20#    Cybex Row 15 reps    Cybex Row Limitations 20#    Wall Wash with 1# cuff weight; flex and scaption 10 reps each    Other ROM/Strengthening Exercises cabinet reach flex and scaption with 1# 10 reps to second shelf                  PT Education - 11/20/20 1012    Education Details HEP: Access Code: [email or web address]    Person(s) Educated Patient    Methods Explanation;Demonstration;Verbal cues;Handout    Comprehension Verbalized understanding;Returned demonstration;Verbal cues required;Need further instruction            PT Short Term Goals - 10/30/20 1111      PT SHORT TERM GOAL #1   Title Patient will be independent with initial HEP    Status Achieved   10/30/20            PT Long Term Goals - 11/20/20 0942      PT LONG TERM GOAL #1   Title Patient will be independent with ongoing/advanced HEP for self-management at home    Status On-going   11/03/20 - met for current HEP but limited tolerance for UT stretch     PT LONG TERM GOAL #2   Title Improve posture and alignment with patient to demonstrate improved upright posture with posterior shoulder girdle engaged    Status Partially Met   11/03/20 - pt notes improved awareness of posture but continues to need to frequently self-correct     PT LONG TERM GOAL #3   Title Patient to improve B shoulder AROM to WNL without pain provocation    Status Partially Met   11/03/20 -  B shoulder AROM WNL but pain still noted with L shoulder  abduction & FER     PT LONG TERM GOAL #4   Title Patient will demonstrate improved B shoulder strength to >/= 4+/5 w/o pain provocation for functional UE use    Status Partially Met   L shld abd 4/5     PT LONG TERM GOAL #5   Title Patient to report ability to perform ADLs, household, and work-related tasks without increased pain    Status On-going   11/20/20: just a little discomfort with some activities                Plan - 11/20/20 1014    Clinical Impression Statement Pt came into clinic with reports of less tightness in L upper shoulder/neck. She has good ROM in her L shoulder, still needs strength in abduction plane and has mild reports of discomfort more into flexion. Focused on strengthening in OH planes with good response from pt. Occassional cues were needed for preventing scap compensation during OH lifting but other than that patient demonstrated a good performance of exercises. She is progressing well toward goals.    Personal Factors and Comorbidities Time since onset of injury/illness/exacerbation;Past/Current Experience;Comorbidity 3+    Comorbidities L breast cancer with partial mastectomy 2016, B breast reduction surgery at time of mastectomy, h/o bone spur R shoulder, B CTR, R forearm fx, B knee scope with meniscal repair, HTN, osteopenia, bilateral BBB, GERD    PT Frequency 2x / week    PT Duration 6 weeks    PT Treatment/Interventions ADLs/Self Care Home Management;Cryotherapy;Electrical Stimulation;Iontophoresis 34m/ml Dexamethasone;Moist Heat;Functional mobility training;Therapeutic activities;Therapeutic exercise;Neuromuscular re-education;Patient/family education;Manual techniques;Passive range of motion;Dry needling;Taping;Spinal Manipulations    PT Next Visit Plan assess response to DN; progress postural stretching and strengthening; manual therapy including DN and modalities PRN to address increased muscle  tension/pain    PT Home Exercise Plan MedBridge Access Code: [email or web address](2/23), [email or web address] (3/11), [email or web address] (4/15)           Patient will benefit from skilled therapeutic intervention in order to improve the following deficits and impairments:  Pain,Postural dysfunction,Improper body mechanics,Increased fascial restricitons,Increased muscle spasms,Impaired flexibility,Decreased range of motion,Decreased strength,Decreased activity tolerance  Visit Diagnosis: Acute pain of left shoulder  Cervicalgia  Abnormal posture  Muscle weakness (generalized)  Acute pain of right shoulder     Problem List Patient Active Problem List   Diagnosis Date Noted  . Obesity (BMI 30-39.9) 11/11/2020  . Metabolic syndrome 037/11/8887 . Mixed hyperlipidemia 04/21/2020  . Polyp of ascending colon 04/21/2020  . History of breast cancer, Tamoxifen, nearing five years, Dr. MGriffith Citron09/14/2021  . Osteopenia 10/02/2018  . 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 Vitamin D deficiency 01/29/2016  . Eczema 12/29/2014  . Venous insufficiency 10/16/2014  . Essential hypertension 10/01/2014  . Hypercholesteremia 10/01/2014  . GERD (gastroesophageal reflux disease) 10/01/2014  . Other bilateral bundle branch block 06/23/2003    BArtist Pais PTA 11/20/2020, 11:34 AM  [facility] [place] [address]Phone: [phone]  Fax:  [phone] Name: Laura Watson JMartiniqueMRN: [id number] of Birth: [birth year]

## 2020-11-24 ENCOUNTER — Ambulatory Visit: Payer: Medicare HMO | Admitting: Physical Therapy

## 2020-11-24 ENCOUNTER — Other Ambulatory Visit: Payer: Self-pay

## 2020-11-24 ENCOUNTER — Encounter: Payer: Self-pay | Admitting: Physical Therapy

## 2020-11-24 DIAGNOSIS — M25512 Pain in left shoulder: Secondary | ICD-10-CM

## 2020-11-24 DIAGNOSIS — M25511 Pain in right shoulder: Secondary | ICD-10-CM | POA: Diagnosis not present

## 2020-11-24 DIAGNOSIS — M6281 Muscle weakness (generalized): Secondary | ICD-10-CM

## 2020-11-24 DIAGNOSIS — M542 Cervicalgia: Secondary | ICD-10-CM | POA: Diagnosis not present

## 2020-11-24 DIAGNOSIS — R293 Abnormal posture: Secondary | ICD-10-CM

## 2020-11-24 NOTE — Therapy (Signed)
[facility] [place] [address]  [address] [place], [place], [address] Phone: [phone]   Fax:  [phone]  Physical Therapy Treatment  Patient Details  Name: Laura Bush MRN: [id number] Date of Birth: 08/13/1950 Referring Provider (PT): Evan S. Corey, MD   Encounter Date: 11/24/2020   PT End of Session - 11/24/20 1108    Visit Number 10    Number of Visits 17    Date for PT Re-Evaluation 12/15/20    Authorization Type [employer]    Progress Note Due on Visit 15    PT Start Time 1017    PT Stop Time 1112    PT Time Calculation (min) 55 min    Activity Tolerance Patient tolerated treatment well    Behavior During Therapy WFL for tasks assessed/performed           Past Medical History:  Diagnosis Date  . Asthma    in cold weather  . Breast cancer (HCC) 09/18/14   left breast  . Breast cancer (HCC) 10/07/14   bx left breast  . Breast cancer of upper-outer quadrant of left female breast (HCC) 09/22/2014  . Cluster headaches    in her 40's  . GERD (gastroesophageal reflux disease)   . Heart murmur    as a child (has outgrown)  . High cholesterol   . Hypertension   . Lower extremity edema   . Microscopic colitis   . Morbid obesity (HCC) 09/30/2019  . Obesity (BMI 30-39.9) 11/11/2020  . OSA on CPAP   . Personal history of radiation therapy   . Pneumonia 2000's X 1  . PONV (postoperative nausea and vomiting)   . Rosacea   . S/P radiation therapy 12/30/14-01/27/15   left breast 50Gy total dose  . 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 Swallowing difficulty   . Venous insufficiency     Past Surgical History:  Procedure Laterality Date  . ABDOMINAL HYSTERECTOMY  1999  . APPENDECTOMY  ~ 2006  . BREAST BIOPSY Left 10/2014  . BREAST LUMPECTOMY Left 2016  . BREAST REDUCTION SURGERY Bilateral 11/11/2014   Procedure: Bilateral Breast Reduction;  Surgeon: David Bowers, MD;  Location: [facility];  Service: Plastics;   Laterality: Bilateral;  . CARPAL TUNNEL RELEASE Bilateral    2 surgeries on right, 1 on left  . CESAREAN SECTION  1978; 1980  . COLONOSCOPY    . FOREARM FRACTURE SURGERY Right 2003 X 3  . KNEE ARTHROSCOPY Bilateral    meniscus repair  . PARTIAL MASTECTOMY WITH NEEDLE LOCALIZATION AND AXILLARY SENTINEL LYMPH NODE BX Left 11/11/2014   Procedure: LEFT BREAST PARTIAL MASTECTOMY WITH NEEDLE LOCALIZATION TIMES TWO AND LEFT AXILLARY SENTINEL LYMPH NODE Biopsy;  Surgeon: Haywood Ingram, MD;  Location: [facility];  Service: General;  Laterality: Left;  . SQUAMOUS CELL CARCINOMA EXCISION  1980's X 1   "nose"  . TONSILLECTOMY  ~ 1957  . TUBAL LIGATION  ?1984    There were no vitals filed for this visit.   Subjective Assessment - 11/24/20 1020    Subjective Pt reports she felt good following Fridays treatment, over the weekend she kept up with the exercises and Monday she was able to sew for 30 minutes at a time without L shoulder pain.She reports no pain today but does feel some tightness in her left upper back. Feels her awareness of posture is making a big positive difference in her rehab.    Diagnostic tests 09/17/20 -   shoulder x-rays: L - Mild AC joint degenerative  changes; R - Mild glenohumeral and moderate AC joint degenerative changes    Patient Stated Goals "some direction on movement or motion to do to prevent or alleviate the discomfort"    Currently in Pain? No/denies    Pain Score 0-No pain                             [facility] Adult PT Treatment/Exercise - 11/24/20 0001      Shoulder Exercises: Standing   Flexion Strengthening;Both;Weights;20 reps    Shoulder Flexion Weight (lbs) #1    ABduction Left;Theraband;10 reps    Theraband Level (Shoulder ABduction) Level 2 (Red)    ABduction Limitations performed in the scapular plane      Shoulder Exercises: ROM/Strengthening   UBE (Upper Arm Bike) L2 x 6 min (3' fwd/3' back)    Lat Pull --   24 reps   Lat Pull Limitations  20#    Cybex Row --   24 reps   Cybex Row Limitations 20#   1 x 12 horizontal ABD (top grip); 1 x 12 row (bottom grip)   Ball on Wall 2 x 30 seconds L shoulder flexion at 90 CCW 15 seconds CW 15 seconds      Modalities   Modalities Moist Heat      Moist Heat Therapy   Number Minutes Moist Heat 10 Minutes    Moist Heat Location Shoulder;Other (comment)   L UT     Manual Therapy   Manual Therapy Soft tissue mobilization;Joint mobilization    Joint Mobilization L shoulder inf/posterior GHJ glides grades III-IV; lateral glides    Soft tissue mobilization STM to B UT; DTM, TPR L UT    Passive ROM all planes                    PT Short Term Goals - 10/30/20 1111      PT SHORT TERM GOAL #1   Title Patient will be independent with initial HEP    Status Achieved   10/30/20            PT Long Term Goals - 11/20/20 0942      PT LONG TERM GOAL #1   Title Patient will be independent with ongoing/advanced HEP for self-management at home    Status On-going   11/03/20 - met for current HEP but limited tolerance for UT stretch     PT LONG TERM GOAL #2   Title Improve posture and alignment with patient to demonstrate improved upright posture with posterior shoulder girdle engaged    Status Partially Met   11/03/20 - pt notes improved awareness of posture but continues to need to frequently self-correct     PT LONG TERM GOAL #3   Title Patient to improve B shoulder AROM to WNL without pain provocation    Status Partially Met   11/03/20 - B shoulder AROM WNL but pain still noted with L shoulder abduction & FER     PT LONG TERM GOAL #4   Title Patient will demonstrate improved B shoulder strength to >/= 4+/5 w/o pain provocation for functional UE use    Status Partially Met   L shld abd 4/5     PT LONG TERM GOAL #5   Title Patient to report ability to perform ADLs, household, and work-related tasks without increased pain    Status On-going     11/20/20: just a little discomfort with  some activities                Plan - 11/24/20 1109    Clinical Impression Statement [name] reports she hasn't had any pain in her L shoulder and has been able to slowly return to activities such as sewing for 30 minutes at a time. She feels her exercises and change in posture are the reason for this positive change. She presents with a trigger point in her L UT and some stiffness in her L shoulder during manual therapy. She reports the posterior shoulder joint mobilizations "open up" her shoulder capsule. She shows good form with exercises and can progress into more AROM and strengthening for the RC and Scapular stabilizers. She will continue to benefit from skilled PT to increase L shoulder strength, ROM, and endurance for improvements in function.    Personal Factors and Comorbidities Time since onset of injury/illness/exacerbation;Past/Current Experience;Comorbidity 3+    Comorbidities L breast cancer with partial mastectomy 2016, B breast reduction surgery at time of mastectomy, h/o bone spur R shoulder, B CTR, R forearm fx, B knee scope with meniscal repair, HTN, osteopenia, bilateral BBB, GERD    PT Frequency 2x / week    PT Duration 6 weeks    PT Treatment/Interventions ADLs/Self Care Home Management;Cryotherapy;Electrical Stimulation;Iontophoresis 4mg/ml Dexamethasone;Moist Heat;Functional mobility training;Therapeutic activities;Therapeutic exercise;Neuromuscular re-education;Patient/family education;Manual techniques;Passive range of motion;Dry needling;Taping;Spinal Manipulations    PT Next Visit Plan Progress postural stretching and strengthening; Progress RC strengthening as tolerated; manual therapy including DN and modalities PRN to address increased muscle tension/pain    PT Home Exercise Plan MedBridge Access Code: [email or web address] (2/23), [email or web address] (3/11), [email or web address] (4/15)    Consulted and Agree with Plan of Care Patient           Patient will benefit from skilled therapeutic  intervention in order to improve the following deficits and impairments:  Pain,Postural dysfunction,Improper body mechanics,Increased fascial restricitons,Increased muscle spasms,Impaired flexibility,Decreased range of motion,Decreased strength,Decreased activity tolerance  Visit Diagnosis: Acute pain of left shoulder  Cervicalgia  Abnormal posture  Muscle weakness (generalized)     Problem List Patient Active Problem List   Diagnosis Date Noted  . Obesity (BMI 30-39.9) 11/11/2020  . Metabolic syndrome 04/21/2020  . Mixed hyperlipidemia 04/21/2020  . Polyp of ascending colon 04/21/2020  . History of breast cancer, Tamoxifen, nearing five years, Dr. Magrinot 04/21/2020  . Osteopenia 10/02/2018  . Malignant 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, estrogen receptor positive (HCC) 05/29/2017  . 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 Vitamin D deficiency 01/29/2016  . Eczema 12/29/2014  . Venous insufficiency 10/16/2014  . Essential hypertension 10/01/2014  . Hypercholesteremia 10/01/2014  . GERD (gastroesophageal reflux disease) 10/01/2014  . Other bilateral bundle branch block 06/23/2003    [name] Rhodes SPT 11/24/2020, 11:21 AM  [facility] [place] [address]  [address] [place], [place], [address] Phone: [phone]   Fax:  [phone]  Name: Dereon Watson Lacombe MRN: [id number] Date of Birth: 03/30/1951   

## 2020-11-27 ENCOUNTER — Ambulatory Visit: Payer: Medicare HMO | Admitting: Physical Therapy

## 2020-11-27 ENCOUNTER — Other Ambulatory Visit: Payer: Self-pay

## 2020-11-27 ENCOUNTER — Encounter: Payer: Self-pay | Admitting: Physical Therapy

## 2020-11-27 DIAGNOSIS — M25512 Pain in left shoulder: Secondary | ICD-10-CM

## 2020-11-27 DIAGNOSIS — M542 Cervicalgia: Secondary | ICD-10-CM | POA: Diagnosis not present

## 2020-11-27 DIAGNOSIS — R293 Abnormal posture: Secondary | ICD-10-CM

## 2020-11-27 DIAGNOSIS — M25511 Pain in right shoulder: Secondary | ICD-10-CM

## 2020-11-27 DIAGNOSIS — M6281 Muscle weakness (generalized): Secondary | ICD-10-CM

## 2020-11-27 NOTE — Therapy (Signed)
[facility] [place] [address]  [place], [place], [address] Phone: [phone]   Fax:  [phone]  Physical Therapy Treatment  Patient Details  Name: Laura Bush MRN: [id number] Date of Birth: 05-19-1951 Referring Provider (PT): Gregor Hams, MD   Encounter Date: 11/27/2020   PT End of Session - 11/27/20 0929    Visit Number 11    Number of Visits 17    Date for PT Re-Evaluation 12/15/20    Authorization Type [employer] Medicare    Progress Note Due on Visit 15   Recert on visit #5 - 10/30/38   PT Start Time 0929    PT Stop Time 1009    PT Time Calculation (min) 40 min    Activity Tolerance Patient tolerated treatment well    Behavior During Therapy [facility] for tasks assessed/performed           Past Medical History:  Diagnosis Date  . Asthma    in cold weather  . Breast cancer ([place]) 09/18/14   left breast  . Breast cancer ([place]) 10/07/14   bx left breast  . Breast cancer of upper-outer quadrant of left female breast ([place]) 09/22/2014  . Cluster headaches    in her 40's  . GERD (gastroesophageal reflux disease)   . Heart murmur    as a child (has outgrown)  . High cholesterol   . Hypertension   . Lower extremity edema   . Microscopic colitis   . Morbid obesity ([place]) 09/30/2019  . Obesity (BMI 30-39.9) 11/11/2020  . OSA on CPAP   . Personal history of radiation therapy   . Pneumonia 2000's X 1  . PONV (postoperative nausea and vomiting)   . Rosacea   . S/P radiation therapy 12/30/14-01/27/15   left breast 50Gy total dose  . 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 Swallowing difficulty   . Venous insufficiency     Past Surgical History:  Procedure Laterality Date  . ABDOMINAL HYSTERECTOMY  1999  . APPENDECTOMY  ~ 2006  . BREAST BIOPSY Left 10/2014  . BREAST LUMPECTOMY Left 2016  . BREAST REDUCTION SURGERY Bilateral 11/11/2014   Procedure: Bilateral Breast Reduction;  Surgeon: Crissie Reese, MD;  Location:  [facility];  Service: Plastics;  Laterality: Bilateral;  . CARPAL TUNNEL RELEASE Bilateral    2 surgeries on right, 1 on left  . CESAREAN SECTION  1978; 1980  . COLONOSCOPY    . FOREARM FRACTURE SURGERY Right 2003 X 3  . KNEE ARTHROSCOPY Bilateral    meniscus repair  . PARTIAL MASTECTOMY WITH NEEDLE LOCALIZATION AND AXILLARY SENTINEL LYMPH NODE BX Left 11/11/2014   Procedure: LEFT BREAST PARTIAL MASTECTOMY WITH NEEDLE LOCALIZATION TIMES TWO AND LEFT AXILLARY SENTINEL LYMPH NODE Biopsy;  Surgeon: Fanny Skates, MD;  Location: [facility];  Service: General;  Laterality: Left;  . SQUAMOUS CELL CARCINOMA EXCISION  1980's X 1   "nose"  . TONSILLECTOMY  ~ 1957  . TUBAL LIGATION  ?1984    There were no vitals filed for this visit.   Subjective Assessment - 11/27/20 0932    Subjective Pt reports yesterday was a general neuralgia but no specific shoulder issues for several days. She feels that she has picked up on the cues and has been able to use these to stop the pain before it beocmes a problem. Has been able to do some sewing and gardening this week with no problems.    Diagnostic tests 09/17/20 -  shoulder x-rays: L - Mild AC joint degenerative  changes; R - Mild glenohumeral and moderate AC joint degenerative changes    Patient Stated Goals "some direction on movement or motion to do to prevent or alleviate the discomfort"    Currently in Pain? No/denies              [facility] PT Assessment - 11/27/20 0001      AROM   Overall AROM Comments B shoulder ROM WNL/WFL with awareness of L side more evident but no pain                         [facility] Adult PT Treatment/Exercise - 11/27/20 0929      Self-Care   Self-Care Other Self-Care Comments    Other Self-Care Comments  Reviewed options for self-STM/TPR using a ball on the wall or a Theracane      Exercises   Exercises Shoulder      Shoulder Exercises: Prone   Extension Both;10 reps;AROM;Strengthening    Extension Limitations I's  over green Pball    External Rotation Both;10 reps;AROM;Strengthening    External Rotation Limitations W's over green Pball    Horizontal ABduction 1 Both;10 reps;AROM;Strengthening    Horizontal ABduction 1 Limitations T's over green Pball    Horizontal ABduction 2 Both;10 reps;AROM;Strengthening    Horizontal ABduction 2 Limitations Y's over green Pball      Shoulder Exercises: Standing   ABduction Left;10 reps;Strengthening;Theraband    Theraband Level (Shoulder ABduction) Level 2 (Red)    ABduction Limitations performed in the scapular plane    Extension Left;10 reps;Strengthening;Theraband    Theraband Level (Shoulder Extension) Level 3 (Green)    Row Both;10 reps;Theraband;Strengthening    Theraband Level (Shoulder Row) Level 3 (Green)    Diagonals Left;10 reps;Strengthening;Theraband    Theraband Level (Shoulder Diagonals) Level 1 (Yellow)    Diagonals Limitations D1/D2 flexion & extension      Shoulder Exercises: ROM/Strengthening   UBE (Upper Arm Bike) L2.0 x 6 min (3' fwd/3' back)    "W" Arms Red TB "W" row 2 x 10                  PT Education - 11/27/20 1005    Education Details Reivew of resistance progression for HEP per TB colors; Review of self-STM/TPR with ball on wall or Theracane    Person(s) Educated Patient    Methods Explanation;Demonstration    Comprehension Verbalized understanding            PT Short Term Goals - 10/30/20 1111      PT SHORT TERM GOAL #1   Title Patient will be independent with initial HEP    Status Achieved   10/30/20            PT Long Term Goals - 11/27/20 0934      PT LONG TERM GOAL #1   Title Patient will be independent with ongoing/advanced HEP for self-management at home    Status Partially Met   11/27/20 - met for current HEP   Target Date 12/15/20      PT LONG TERM GOAL #2   Title Improve posture and alignment with patient to demonstrate improved upright posture with posterior shoulder girdle engaged     Status Achieved   11/27/20 - improving awareness and self-correction   Target Date --      PT LONG TERM GOAL #3   Title Patient to improve B shoulder  AROM to WNL without pain provocation    Status Achieved   11/27/20     PT LONG TERM GOAL #4   Title Patient will demonstrate improved B shoulder strength to >/= 4+/5 w/o pain provocation for functional UE use    Status Partially Met   11/20/20 - met except L shoulder ABD 4/5   Target Date 12/15/20      PT LONG TERM GOAL #5   Title Patient to report ability to perform ADLs, household, and work-related tasks without increased pain    Status Partially Met   11/27/20 - improving tolerance for sewing & gardening   Target Date 12/15/20                 Plan - 11/27/20 1009    Clinical Impression Statement [name] reports some generalized neuralgia yesterday but states her localized shoulder, upper back or neck pain has not really been a problem of late. She notes approved awareness of posture and has learned to make sure she takes breaks or changes position at least every 30 minutes to avoid getting too engaged in an activity to the point where she loses track of her posture. Therapeutic exercises focusing on progression of abduction and postural strengthening, introducing UE diagonals and prone retraction exercises over Pball. Pt noting some fatigue with exercises but no increased pain. [name] is progressing well towards her goals and has now met LTGs #2 & 3, with remaining LTGs at least partially met. She is nearing the end of her POC and feels that she will be ready to transition to her HEP at that time, therefore will plan for review and consolidation of the HEP next week with a final f/u after she returns from traveling for Mother's Day.    Comorbidities L breast cancer with partial mastectomy 2016, B breast reduction surgery at time of mastectomy, h/o bone spur R shoulder, B CTR, R forearm fx, B knee scope with meniscal repair, HTN, osteopenia,  bilateral BBB, GERD    Rehab Potential Good    PT Frequency 2x / week    PT Duration 6 weeks    PT Treatment/Interventions ADLs/Self Care Home Management;Cryotherapy;Electrical Stimulation;Iontophoresis 63m/ml Dexamethasone;Moist Heat;Functional mobility training;Therapeutic activities;Therapeutic exercise;Neuromuscular re-education;Patient/family education;Manual techniques;Passive range of motion;Dry needling;Taping;Spinal Manipulations    PT Next Visit Plan HEP review/update in prep for transtion to HEP at D/C: progress postural stretching/strengthening & RC strengthening as tolerated; Manual therapy including DN and modalities PRN to address increased muscle tension/pain    PT Home Exercise Plan MedBridge Access Code: [email or web address](2/23), [email or web address] (3/11), [email or web address] (4/15)    Consulted and Agree with Plan of Care Patient           Patient will benefit from skilled therapeutic intervention in order to improve the following deficits and impairments:  Pain,Postural dysfunction,Improper body mechanics,Increased fascial restricitons,Increased muscle spasms,Impaired flexibility,Decreased range of motion,Decreased strength,Decreased activity tolerance  Visit Diagnosis: Acute pain of left shoulder  Acute pain of right shoulder  Cervicalgia  Abnormal posture  Muscle weakness (generalized)     Problem List Patient Active Problem List   Diagnosis Date Noted  . Obesity (BMI 30-39.9) 11/11/2020  . Metabolic syndrome 071/69/6789 . Mixed hyperlipidemia 04/21/2020  . Polyp of ascending colon 04/21/2020  . History of breast cancer, Tamoxifen, nearing five years, Dr. MGriffith Citron09/14/2021  . Osteopenia 10/02/2018  . Malignant 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, [place] 05/29/2017  . 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  Vitamin D deficiency 01/29/2016  . Eczema 12/29/2014  . Venous insufficiency 10/16/2014  . Essential  hypertension 10/01/2014  . Hypercholesteremia 10/01/2014  . GERD (gastroesophageal reflux disease) 10/01/2014  . Other bilateral bundle branch block 06/23/2003    Percival Spanish, PT, MPT 11/27/2020, 10:32 AM  [facility] [address]  [place], [place], [address] Phone: [phone]   Fax:  [phone]  Name: Javayah Watson Bush MRN: [id number] Date of Birth: [birth year]

## 2020-11-30 ENCOUNTER — Ambulatory Visit (INDEPENDENT_AMBULATORY_CARE_PROVIDER_SITE_OTHER): Payer: Medicare HMO | Admitting: Family Medicine

## 2020-11-30 ENCOUNTER — Other Ambulatory Visit: Payer: Self-pay

## 2020-11-30 ENCOUNTER — Encounter (INDEPENDENT_AMBULATORY_CARE_PROVIDER_SITE_OTHER): Payer: Self-pay | Admitting: Family Medicine

## 2020-11-30 VITALS — BP 124/74 | HR 56 | Temp 97.9°F | Ht 59.0 in | Wt 170.0 lb

## 2020-11-30 DIAGNOSIS — E7849 Other hyperlipidemia: Secondary | ICD-10-CM

## 2020-11-30 DIAGNOSIS — I10 Essential (primary) hypertension: Secondary | ICD-10-CM | POA: Diagnosis not present

## 2020-11-30 DIAGNOSIS — Z6839 Body mass index (BMI) 39.0-39.9, adult: Secondary | ICD-10-CM | POA: Diagnosis not present

## 2020-11-30 DIAGNOSIS — R7303 Prediabetes: Secondary | ICD-10-CM

## 2020-11-30 DIAGNOSIS — M25511 Pain in right shoulder: Secondary | ICD-10-CM

## 2020-11-30 DIAGNOSIS — Z9989 Dependence on other enabling machines and devices: Secondary | ICD-10-CM

## 2020-11-30 DIAGNOSIS — G4733 Obstructive sleep apnea (adult) (pediatric): Secondary | ICD-10-CM | POA: Diagnosis not present

## 2020-11-30 DIAGNOSIS — M25512 Pain in left shoulder: Secondary | ICD-10-CM | POA: Diagnosis not present

## 2020-11-30 MED ORDER — METFORMIN HCL 500 MG PO TABS: 500.0000 mg | ORAL_TABLET | Freq: Two times a day (BID) | ORAL | 0 refills | Status: DC

## 2020-11-30 NOTE — Progress Notes (Signed)
Chief Complaint:   OBESITY Laura Bush is here to discuss her progress with her obesity treatment plan along with follow-up of her obesity related diagnoses.   Today's visit was #: 15 Starting weight: 196 lbs Starting date: 10/17/2019 Today's weight: 170 lbs Today's date: 11/30/2020 Total lbs lost to date: 26 lbs Body mass index is 34.34 kg/m.  Total weight loss percentage to date: -12.76%  Interim History:  Laura Bush's weight has been going up and down 3-4 pounds.  She had COVID for 2 weeks.  Still short of breath and fatigued with exertion.  Current Meal Plan: the [place] with chicken for 90% of the time.  Current Exercise Plan: Yoga/walking/PT for 60 minutes 4-5 times per week.  Assessment/Plan:   1. OSA on CPAP Her CPAP supplies are on backorder but her current machine is still working.  Goal: Treatment of OSA via CPAP compliance and weight loss. . Plasma ghrelin levels (appetite or "hunger hormone") are significantly higher in OSA patients than in BMI-matched controls, but decrease to levels similar to those of obese patients without OSA after CPAP treatment.  . Weight loss improves OSA by several mechanisms, including reduction in fatty tissue in the throat (i.e. parapharyngeal fat) and the tongue. Loss of abdominal fat increases mediastinal traction on the upper airway making it less likely to collapse during sleep. . Studies have also shown that compliance with CPAP treatment improves leptin (hunger inhibitory hormone) imbalance.  2. Essential hypertension At goal. Medications: carvedilol 6.25 mg twice daily, HCTZ 12.5 mg daily, Micardis 80 mg daily, amiloride 5 mg daily.   Plan: Avoid buying foods that are: processed, frozen, or prepackaged to avoid excess salt. We will watch for signs of hypotension as she continues lifestyle modifications. We will continue to monitor closely alongside her PCP and/or Specialist.  Regular follow up with PCP and specialists was also  encouraged.   BP Readings from Last 3 Encounters:  11/30/20 124/74  11/11/20 112/66  10/29/20 104/74   Lab Results  Component Value Date   CREATININE 0.72 10/05/2020   3. Other hyperlipidemia Course: Not optimized. Lipid-lowering medications: Zocor 20 mg daily.   Plan: Dietary changes: Increase soluble fiber, decrease simple carbohydrates, decrease saturated fat. Exercise changes: Moderate to vigorous-intensity aerobic activity 150 minutes per week or as tolerated. We will continue to monitor along with PCP/specialists as it pertains to her weight loss journey.  Lab Results  Component Value Date   CHOL 156 10/05/2020   HDL 54.30 10/05/2020   LDLCALC 66 10/05/2020   TRIG 182.0 (H) 10/05/2020   CHOLHDL 3 10/05/2020   Lab Results  Component Value Date   ALT 21 06/16/2020   AST 19 06/16/2020   ALKPHOS 34 (L) 06/16/2020   BILITOT 0.4 06/16/2020   The 10-year ASCVD risk score Laura Bush DC Jr., et al., 2013) is: 18.2%   Values used to calculate the score:     Age: 70 years     Sex: Female     Is Non-Hispanic African American: No     Diabetic: Yes     Tobacco smoker: No     Systolic Blood Pressure: 993 mmHg     Is BP treated: Yes     HDL Cholesterol: 54.3 mg/dL     Total Cholesterol: 156 mg/dL  4. Bilateral shoulder pain, unspecified chronicity Improving. She is currently in PT for shoulder pain.   5. Prediabetes At goal. Goal is HgbA1c < 5.7.  Medication: metformin 500 mg twice daily.  We discussed GLP-1 RA.  Plan:  She will continue to focus on protein-rich, low simple carbohydrate foods. We reviewed the importance of hydration, regular exercise for stress reduction, and restorative sleep.   Lab Results  Component Value Date   HGBA1C 5.2 04/06/2020   Lab Results  Component Value Date   INSULIN 12.6 10/17/2019   - Refill metFORMIN (GLUCOPHAGE) 500 MG tablet; Take 1 tablet (500 mg total) by mouth 2 (two) times daily.  Dispense: 180 tablet; Refill: 0  6. Obesity,  current BMI 34.5  Course: Laura Bush is currently in the action stage of change. As such, her goal is to continue with weight loss efforts.   Nutrition goals: She has agreed to the [employer] with chicken.   Exercise goals: As is.  Increase as tolerated.  Behavioral modification strategies: increasing lean protein intake, decreasing simple carbohydrates, increasing vegetables and increasing water intake.  Laura Bush has agreed to follow-up with our clinic in 3 weeks. She was informed of the importance of frequent follow-up visits to maximize her success with intensive lifestyle modifications for her multiple health conditions.   Objective:   Blood pressure 124/74, pulse (!) 56, temperature 97.9 F (36.6 C), temperature source Oral, height 4\' 11"  (1.499 m), weight 170 lb (77.1 kg), SpO2 96 %. Body mass index is 34.34 kg/m.  General: Cooperative, alert, well developed, in no acute distress. HEENT: Conjunctivae and lids unremarkable. Cardiovascular: Regular rhythm.  Lungs: Normal work of breathing. Neurologic: No focal deficits.   Lab Results  Component Value Date   CREATININE 0.72 10/05/2020   BUN 19 10/05/2020   NA 141 10/05/2020   K 4.5 10/05/2020   CL 104 10/05/2020   CO2 30 10/05/2020   Lab Results  Component Value Date   ALT 21 06/16/2020   AST 19 06/16/2020   ALKPHOS 34 (L) 06/16/2020   BILITOT 0.4 06/16/2020   Lab Results  Component Value Date   HGBA1C 5.2 04/06/2020   HGBA1C 5.6 04/03/2019   HGBA1C 5.6 03/28/2018   Lab Results  Component Value Date   INSULIN 12.6 10/17/2019   Lab Results  Component Value Date   TSH 0.88 10/05/2020   Lab Results  Component Value Date   CHOL 156 10/05/2020   HDL 54.30 10/05/2020   LDLCALC 66 10/05/2020   TRIG 182.0 (H) 10/05/2020   CHOLHDL 3 10/05/2020   Lab Results  Component Value Date   WBC 5.0 06/16/2020   HGB 12.5 06/16/2020   HCT 37.1 06/16/2020   MCV 97.4 06/16/2020   PLT 178 06/16/2020   Lab Results   Component Value Date   IRON 120 10/17/2019   TIBC 295 10/17/2019   FERRITIN 206 (H) 12/12/2019   Obesity Behavioral Intervention:   Approximately 15 minutes were spent on the discussion below.  ASK: We discussed the diagnosis of obesity with Laura Bush today and Laura Bush agreed to give [place] permission to discuss obesity behavioral modification therapy today.  ASSESS: Laura Bush has the diagnosis of obesity and her BMI today is 34.5. Laura Bush is in the action stage of change.   ADVISE: Laura Bush was educated on the multiple health risks of obesity as well as the benefit of weight loss to improve her health. She was advised of the need for long term treatment and the importance of lifestyle modifications to improve her current health and to decrease her risk of future health problems.  AGREE: Multiple dietary modification options and treatment options were discussed and Laura Bush agreed to follow the recommendations documented  in the above note.  ARRANGE: Taniaya was educated on the importance of frequent visits to treat obesity as outlined per CMS and USPSTF guidelines and agreed to schedule her next follow up appointment today.  Attestation Statements:   Reviewed by clinician on day of visit: allergies, medications, problem list, medical history, surgical history, family history, social history, and previous encounter notes.  I, Water quality scientist, CMA, am acting as transcriptionist for Briscoe Deutscher, DO  I have reviewed the above documentation for accuracy and completeness, and I agree with the above. Briscoe Deutscher, DO

## 2020-12-01 ENCOUNTER — Encounter (INDEPENDENT_AMBULATORY_CARE_PROVIDER_SITE_OTHER): Payer: Self-pay | Admitting: Family Medicine

## 2020-12-01 ENCOUNTER — Encounter: Payer: Self-pay | Admitting: Physical Therapy

## 2020-12-01 ENCOUNTER — Ambulatory Visit: Payer: Medicare HMO | Admitting: Physical Therapy

## 2020-12-01 DIAGNOSIS — M6281 Muscle weakness (generalized): Secondary | ICD-10-CM

## 2020-12-01 DIAGNOSIS — R293 Abnormal posture: Secondary | ICD-10-CM

## 2020-12-01 DIAGNOSIS — M542 Cervicalgia: Secondary | ICD-10-CM | POA: Diagnosis not present

## 2020-12-01 DIAGNOSIS — M25512 Pain in left shoulder: Secondary | ICD-10-CM

## 2020-12-01 DIAGNOSIS — M25511 Pain in right shoulder: Secondary | ICD-10-CM

## 2020-12-01 NOTE — Patient Instructions (Signed)
    Access Code: [email or web address] URL: [email or web address] Date: 12/01/2020 Prepared by: Annie Paras  Exercises Standing Single Arm Shoulder PNF D1 Flexion with Anchored Resistance - 1 x daily - 7 x weekly - 2 sets - 10 reps - 2-3 sec hold Standing Shoulder Single Arm PNF D2 Flexion with Anchored Resistance - 1 x daily - 7 x weekly - 2 sets - 10 reps - 2-3 sec hold Standing Shoulder Single Arm PNF D2 Extension with Anchored Resistance - 1 x daily - 7 x weekly - 2 sets - 10 reps - 2-3 sec hold Standing Single Arm Shoulder PNF D1 Extension with Anchored Resistance - 1 x daily - 7 x weekly - 2 sets - 10 reps - 2-3 sec hold

## 2020-12-01 NOTE — Therapy (Signed)
[facility] [address]  [place], [place], [address] Phone: [phone]   Fax:  [phone]  Physical Therapy Treatment  Patient Details  Name: Laura Bush MRN: [id number] Date of Birth: 03-Oct-1950 Referring Provider (PT): Gregor Hams, MD   Encounter Date: 12/01/2020   PT End of Session - 12/01/20 1011    Visit Number 12    Number of Visits 17    Date for PT Re-Evaluation 12/15/20    Authorization Type [employer]    Progress Note Due on Visit 15   Recert on visit #5 - 7/51/02   PT Start Time 1011    PT Stop Time 1104    PT Time Calculation (min) 53 min    Activity Tolerance Patient tolerated treatment well    Behavior During Therapy [facility] for tasks assessed/performed           Past Medical History:  Diagnosis Date  . Asthma    in cold weather  . Breast cancer ([place]) 09/18/14   left breast  . Breast cancer ([place]) 10/07/14   bx left breast  . Breast cancer of upper-outer quadrant of left female breast ([place]) 09/22/2014  . Cluster headaches    in her 40's  . GERD (gastroesophageal reflux disease)   . Heart murmur    as a child (has outgrown)  . High cholesterol   . Hypertension   . Lower extremity edema   . Microscopic colitis   . Morbid obesity ([place]) 09/30/2019  . Obesity (BMI 30-39.9) 11/11/2020  . OSA on CPAP   . Personal history of radiation therapy   . Pneumonia 2000's X 1  . PONV (postoperative nausea and vomiting)   . Rosacea   . S/P radiation therapy 12/30/14-01/27/15   left breast 50Gy total dose  . 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 Swallowing difficulty   . Venous insufficiency     Past Surgical History:  Procedure Laterality Date  . ABDOMINAL HYSTERECTOMY  1999  . APPENDECTOMY  ~ 2006  . BREAST BIOPSY Left 10/2014  . BREAST LUMPECTOMY Left 2016  . BREAST REDUCTION SURGERY Bilateral 11/11/2014   Procedure: Bilateral Breast Reduction;  Surgeon: Crissie Reese, MD;  Location:  [facility];  Service: Plastics;  Laterality: Bilateral;  . CARPAL TUNNEL RELEASE Bilateral    2 surgeries on right, 1 on left  . CESAREAN SECTION  1978; 1980  . COLONOSCOPY    . FOREARM FRACTURE SURGERY Right 2003 X 3  . KNEE ARTHROSCOPY Bilateral    meniscus repair  . PARTIAL MASTECTOMY WITH NEEDLE LOCALIZATION AND AXILLARY SENTINEL LYMPH NODE BX Left 11/11/2014   Procedure: LEFT BREAST PARTIAL MASTECTOMY WITH NEEDLE LOCALIZATION TIMES TWO AND LEFT AXILLARY SENTINEL LYMPH NODE Biopsy;  Surgeon: Fanny Skates, MD;  Location: [facility];  Service: General;  Laterality: Left;  . SQUAMOUS CELL CARCINOMA EXCISION  1980's X 1   "nose"  . TONSILLECTOMY  ~ 1957  . TUBAL LIGATION  ?1984    There were no vitals filed for this visit.   Subjective Assessment - 12/01/20 1013    Subjective Pt reports she took a yoga class yesterday and feels that one of the stretches she tried in R side lying with her L arm reaching overhead seemed to irritate her L shoulder - she feels like it is likely more her arthritis pain than muscle pain, but is still sore this morning.    Diagnostic tests 09/17/20 - shoulder  x-rays: L - Mild AC joint degenerative  changes; R - Mild glenohumeral and moderate AC joint degenerative changes    Patient Stated Goals "some direction on movement or motion to do to prevent or alleviate the discomfort"    Currently in Pain? Yes    Pain Score 2     Pain Location Shoulder    Pain Orientation Left    Pain Descriptors / Indicators Sore    Pain Type Acute pain    Pain Frequency Intermittent                             [facility] Adult PT Treatment/Exercise - 12/01/20 1011      Exercises   Exercises Shoulder      Shoulder Exercises: Standing   External Rotation Left;10 reps;Strengthening;Theraband    Theraband Level (Shoulder External Rotation) Level 2 (Red)    External Rotation Limitations neutral shoulder with towel roll under elbow    Internal Rotation Left;10  reps;Strengthening;Theraband    Theraband Level (Shoulder Internal Rotation) Level 2 (Red)    Flexion Left;10 reps;Strengthening;Theraband    Theraband Level (Shoulder Flexion) Level 1 (Yellow)    ABduction Left;10 reps;Strengthening;Theraband    Theraband Level (Shoulder ABduction) Level 1 (Yellow)    ABduction Limitations performed in the scapular plane    Extension Both;10 reps;Strengthening;Theraband   2 sets   Theraband Level (Shoulder Extension) Level 3 (Green)    Row Both;10 reps;Strengthening;Theraband   2 sets   Theraband Level (Shoulder Row) Level 3 (Green)    Other Standing Exercises L shoulder flexion cabinet reaches to 1st & 2nd shelves flexion 1# x 10, 2# x 10      Shoulder Exercises: ROM/Strengthening   UBE (Upper Arm Bike) L2.0 x 6 min (3' fwd/3' back)    Ball on Wall L shoulder CW/CCW circles at 90 flexion & scaption x 15 sec each      Shoulder Exercises: Stretch   Other Shoulder Stretches Verbal review of B 3-way doorway pec stretch - clarifying need to maintain upright posture as she steps forward      Moist Heat Therapy   Number Minutes Moist Heat 10 Minutes    Moist Heat Location Shoulder   Left     Neck Exercises: Stretches   Upper Trapezius Stretch Right;Left;1 rep;30 seconds    Levator Stretch Right;Left;1 rep;30 seconds                  PT Education - 12/01/20 1055    Education Details HEP review/update - [id number]    Person(s) Educated Patient    Methods Explanation;Demonstration;Verbal cues;Handout    Comprehension Verbalized understanding;Verbal cues required;Returned demonstration;Need further instruction            PT Short Term Goals - 10/30/20 1111      PT SHORT TERM GOAL #1   Title Patient will be independent with initial HEP    Status Achieved   10/30/20            PT Long Term Goals - 11/27/20 0934      PT LONG TERM GOAL #1   Title Patient will be independent with ongoing/advanced HEP for self-management at home     Status Partially Met   11/27/20 - met for current HEP   Target Date 12/15/20      PT LONG TERM GOAL #2   Title Improve posture and alignment with patient to demonstrate improved upright posture with posterior  shoulder girdle engaged    Status Achieved   11/27/20 - improving awareness and self-correction   Target Date --      PT LONG TERM GOAL #3   Title Patient to improve B shoulder AROM to WNL without pain provocation    Status Achieved   11/27/20     PT LONG TERM GOAL #4   Title Patient will demonstrate improved B shoulder strength to >/= 4+/5 w/o pain provocation for functional UE use    Status Partially Met   11/20/20 - met except L shoulder ABD 4/5   Target Date 12/15/20      PT LONG TERM GOAL #5   Title Patient to report ability to perform ADLs, household, and work-related tasks without increased pain    Status Partially Met   11/27/20 - improving tolerance for sewing & gardening   Target Date 12/15/20                 Plan - 12/01/20 1016    Clinical Impression Statement Laura Bush reports some increased mild L shoulder soreness after stretch performed during a yoga class yesterday which she feels may be more arthritic in nature vs the muscle soreness. Discussed use of pendulum exercises to promote joint distraction and movement of synovial fluid to reduce arthritic pain. Proceeded with planned HEP review clarifying positioning, movement pattern and appropriate theraband resistance as applicable for all exercises as well as added UE diagonals to HEP - pt able to verbalize good understanding and return demonstration with all exercises but did note some fatigue by end of session and requested moist heat pack to L shoulder before leaving session today.    Comorbidities L breast cancer with partial mastectomy 2016, B breast reduction surgery at time of mastectomy, h/o bone spur R shoulder, B CTR, R forearm fx, B knee scope with meniscal repair, HTN, osteopenia, bilateral BBB, GERD    Rehab  Potential Good    PT Frequency 2x / week    PT Duration 6 weeks    PT Treatment/Interventions ADLs/Self Care Home Management;Cryotherapy;Electrical Stimulation;Iontophoresis 36m/ml Dexamethasone;Moist Heat;Functional mobility training;Therapeutic activities;Therapeutic exercise;Neuromuscular re-education;Patient/family education;Manual techniques;Passive range of motion;Dry needling;Taping;Spinal Manipulations    PT Next Visit Plan address questions/concerns with HEP review/update in prep for transtion to HEP at D/C; progress postural stretching/strengthening & RC strengthening as tolerated; Manual therapy including DN and modalities PRN to address increased muscle tension/pain    PT Home Exercise Plan MedBridge Access Code: [email or web address](2/23), [email or web address] (3/11), [email or web address] (4/15), [email or web address](4/26)    Consulted and Agree with Plan of Care Patient           Patient will benefit from skilled therapeutic intervention in order to improve the following deficits and impairments:  Pain,Postural dysfunction,Improper body mechanics,Increased fascial restricitons,Increased muscle spasms,Impaired flexibility,Decreased range of motion,Decreased strength,Decreased activity tolerance  Visit Diagnosis: Acute pain of left shoulder  Acute pain of right shoulder  Cervicalgia  Abnormal posture  Muscle weakness (generalized)     Problem List Patient Active Problem List   Diagnosis Date Noted  . Obesity (BMI 30-39.9) 11/11/2020  . Metabolic syndrome 018/29/9371 . Mixed hyperlipidemia 04/21/2020  . Polyp of ascending colon 04/21/2020  . History of breast cancer, Tamoxifen, nearing five years, Dr. MGriffith Citron09/14/2021  . Osteopenia 10/02/2018  . Malignant 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, [place] 05/29/2017  . 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 Vitamin D deficiency 01/29/2016  . Eczema 12/29/2014  .  Venous insufficiency 10/16/2014  .  Essential hypertension 10/01/2014  . Hypercholesteremia 10/01/2014  . GERD (gastroesophageal reflux disease) 10/01/2014  . Other bilateral bundle branch block 06/23/2003    Percival Spanish, PT, MPT 12/01/2020, 11:33 AM  [facility] [address]  [place], [place], [address] Phone: [phone]   Fax:  [phone]  Name: Laura Bush MRN: [id number] Date of Birth: [birth year]

## 2020-12-04 ENCOUNTER — Other Ambulatory Visit: Payer: Self-pay

## 2020-12-04 ENCOUNTER — Ambulatory Visit: Payer: Medicare HMO | Admitting: Physical Therapy

## 2020-12-04 ENCOUNTER — Encounter: Payer: Self-pay | Admitting: Physical Therapy

## 2020-12-04 DIAGNOSIS — M542 Cervicalgia: Secondary | ICD-10-CM | POA: Diagnosis not present

## 2020-12-04 DIAGNOSIS — M6281 Muscle weakness (generalized): Secondary | ICD-10-CM | POA: Diagnosis not present

## 2020-12-04 DIAGNOSIS — M25512 Pain in left shoulder: Secondary | ICD-10-CM | POA: Diagnosis not present

## 2020-12-04 DIAGNOSIS — R293 Abnormal posture: Secondary | ICD-10-CM

## 2020-12-04 DIAGNOSIS — M25511 Pain in right shoulder: Secondary | ICD-10-CM

## 2020-12-04 NOTE — Therapy (Signed)
[facility] [address]  [place], [place], [address] Phone: [phone]   Fax:  [phone]  Physical Therapy Treatment  Patient Details  Name: Laura Bush MRN: [id number] Date of Birth: 1951-05-19 Referring Provider (PT): Gregor Hams, MD   Encounter Date: 12/04/2020   PT End of Session - 12/04/20 0943    Visit Number 13    Number of Visits 17    Date for PT Re-Evaluation 12/15/20    Authorization Type [employer] Medicare    Progress Note Due on Visit 15   Recert on visit #5 - 0/34/74   PT Start Time 0936    PT Stop Time 1015    PT Time Calculation (min) 39 min    Activity Tolerance Patient tolerated treatment well    Behavior During Therapy [facility] for tasks assessed/performed           Past Medical History:  Diagnosis Date  . Asthma    in cold weather  . Breast cancer ([place]) 09/18/14   left breast  . Breast cancer ([place]) 10/07/14   bx left breast  . Breast cancer of upper-outer quadrant of left female breast ([place]) 09/22/2014  . Cluster headaches    in her 40's  . GERD (gastroesophageal reflux disease)   . Heart murmur    as a child (has outgrown)  . High cholesterol   . Hypertension   . Lower extremity edema   . Microscopic colitis   . Morbid obesity ([place]) 09/30/2019  . Obesity (BMI 30-39.9) 11/11/2020  . OSA on CPAP   . Personal history of radiation therapy   . Pneumonia 2000's X 1  . PONV (postoperative nausea and vomiting)   . Rosacea   . S/P radiation therapy 12/30/14-01/27/15   left breast 50Gy total dose  . 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 Swallowing difficulty   . Venous insufficiency     Past Surgical History:  Procedure Laterality Date  . ABDOMINAL HYSTERECTOMY  1999  . APPENDECTOMY  ~ 2006  . BREAST BIOPSY Left 10/2014  . BREAST LUMPECTOMY Left 2016  . BREAST REDUCTION SURGERY Bilateral 11/11/2014   Procedure: Bilateral Breast Reduction;  Surgeon: Crissie Reese, MD;  Location:  [facility];  Service: Plastics;  Laterality: Bilateral;  . CARPAL TUNNEL RELEASE Bilateral    2 surgeries on right, 1 on left  . CESAREAN SECTION  1978; 1980  . COLONOSCOPY    . FOREARM FRACTURE SURGERY Right 2003 X 3  . KNEE ARTHROSCOPY Bilateral    meniscus repair  . PARTIAL MASTECTOMY WITH NEEDLE LOCALIZATION AND AXILLARY SENTINEL LYMPH NODE BX Left 11/11/2014   Procedure: LEFT BREAST PARTIAL MASTECTOMY WITH NEEDLE LOCALIZATION TIMES TWO AND LEFT AXILLARY SENTINEL LYMPH NODE Biopsy;  Surgeon: Fanny Skates, MD;  Location: [facility];  Service: General;  Laterality: Left;  . SQUAMOUS CELL CARCINOMA EXCISION  1980's X 1   "nose"  . TONSILLECTOMY  ~ 1957  . TUBAL LIGATION  ?1984    There were no vitals filed for this visit.   Subjective Assessment - 12/04/20 0939    Subjective Pt reports her L shoulder is doing better. She is able to do more throughout the day without taking pain medication. Felt great following treatment and the hot pack on it after. She didn't get to the exercises but did many differrent functional activities that required shoulder use without having pain.    Diagnostic tests 09/17/20 - shoulder x-rays: L -  Mild AC joint degenerative  changes; R - Mild glenohumeral and moderate AC joint degenerative changes    Patient Stated Goals "some direction on movement or motion to do to prevent or alleviate the discomfort"    Currently in Pain? Yes    Pain Score 1    Feels this is joint pain, not muscular   Pain Location Shoulder    Pain Orientation Left                             [facility] Adult PT Treatment/Exercise - 12/04/20 0938      Exercises   Exercises Shoulder      Shoulder Exercises: Seated   Other Seated Exercises Seated overhead press AROM 2 x 10 flexion; 2 x 10 ABD      Shoulder Exercises: Standing   Horizontal ABduction Strengthening;Both;10 reps;Theraband    Theraband Level (Shoulder Horizontal ABduction) Level 3 (Green)    External Rotation  Left;Strengthening;Theraband;20 reps    Theraband Level (Shoulder External Rotation) Level 2 (Red)    Internal Rotation Left;Strengthening;Theraband;20 reps    Theraband Level (Shoulder Internal Rotation) Level 1 (Yellow);Level 2 (Red)    Flexion Strengthening;Both;Weights;20 reps    Shoulder Flexion Weight (lbs) #1    Extension Both;10 reps;Strengthening;Theraband   2 sets   Theraband Level (Shoulder Extension) Level 3 (Green)    Row Both;10 reps;Strengthening;Theraband   2 sets   Theraband Level (Shoulder Row) Level 3 (Green)    Diagonals Left;10 reps;Strengthening;Theraband    Theraband Level (Shoulder Diagonals) Level 1 (Yellow)    Diagonals Limitations D1/D2 flexion & extension      Shoulder Exercises: ROM/Strengthening   UBE (Upper Arm Bike) L2.0 x 6 min (3' fwd/3' back)                  PT Education - 12/04/20 1035    Education Details HEP update from previous [id number]    Person(s) Educated Patient    Methods Explanation;Demonstration;Tactile cues;Verbal cues;Handout    Comprehension Tactile cues required;Verbal cues required;Returned demonstration;Verbalized understanding            PT Short Term Goals - 10/30/20 1111      PT SHORT TERM GOAL #1   Title Patient will be independent with initial HEP    Status Achieved   10/30/20            PT Long Term Goals - 11/27/20 0934      PT LONG TERM GOAL #1   Title Patient will be independent with ongoing/advanced HEP for self-management at home    Status Partially Met   11/27/20 - met for current HEP   Target Date 12/15/20      PT LONG TERM GOAL #2   Title Improve posture and alignment with patient to demonstrate improved upright posture with posterior shoulder girdle engaged    Status Achieved   11/27/20 - improving awareness and self-correction   Target Date --      PT LONG TERM GOAL #3   Title Patient to improve B shoulder AROM to WNL without pain provocation    Status Achieved   11/27/20     PT LONG TERM  GOAL #4   Title Patient will demonstrate improved B shoulder strength to >/= 4+/5 w/o pain provocation for functional UE use    Status Partially Met   11/20/20 - met except L shoulder ABD 4/5   Target Date 12/15/20  PT LONG TERM GOAL #5   Title Patient to report ability to perform ADLs, household, and work-related tasks without increased pain    Status Partially Met   11/27/20 - improving tolerance for sewing & gardening   Target Date 12/15/20                 Plan - 12/04/20 1019    Clinical Impression Statement [name] reports she isn't feeling any muscular pain in her L shoulder and instead it's more of joint pain. She has been using it for activities such as basketball with her grandchildren, and gardening and reports no pain. She was able to complete all therapeutic exercises and those from her HEP today with correct form and limited fatigue. She will be assessed for readiness to d/c at the next visit.    Comorbidities L breast cancer with partial mastectomy 2016, B breast reduction surgery at time of mastectomy, h/o bone spur R shoulder, B CTR, R forearm fx, B knee scope with meniscal repair, HTN, osteopenia, bilateral BBB, GERD    Rehab Potential Good    PT Frequency 2x / week    PT Duration 6 weeks    PT Treatment/Interventions ADLs/Self Care Home Management;Cryotherapy;Electrical Stimulation;Iontophoresis 74m/ml Dexamethasone;Moist Heat;Functional mobility training;Therapeutic activities;Therapeutic exercise;Neuromuscular re-education;Patient/family education;Manual techniques;Passive range of motion;Dry needling;Taping;Spinal Manipulations    PT Next Visit Plan Assess readiness for D/C; Assess LTG's, Strength, ROM; Address questions/concerns with HEP review/update in prep for transtion to HEP for D/C; Manual therapy including DN and modalities PRN to address increased muscle tension/pain    PT Home Exercise Plan MedBridge Access Code: [email or web address](2/23), [email or web address] (3/11), [email or web address]  (4/15), [email or web address](4/26; update 4/29)    Consulted and Agree with Plan of Care Patient           Patient will benefit from skilled therapeutic intervention in order to improve the following deficits and impairments:  Pain,Postural dysfunction,Improper body mechanics,Increased fascial restricitons,Increased muscle spasms,Impaired flexibility,Decreased range of motion,Decreased strength,Decreased activity tolerance  Visit Diagnosis: Acute pain of left shoulder  Acute pain of right shoulder  Cervicalgia  Abnormal posture  Muscle weakness (generalized)     Problem List Patient Active Problem List   Diagnosis Date Noted  . Obesity (BMI 30-39.9) 11/11/2020  . Metabolic syndrome 016/38/4536 . Mixed hyperlipidemia 04/21/2020  . Polyp of ascending colon 04/21/2020  . History of breast cancer, Tamoxifen, nearing five years, Dr. MGriffith Citron09/14/2021  . Osteopenia 10/02/2018  . Malignant 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, estrogen receptor positive ([place] 05/29/2017  . 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 Vitamin D deficiency 01/29/2016  . Eczema 12/29/2014  . Venous insufficiency 10/16/2014  . Essential hypertension 10/01/2014  . Hypercholesteremia 10/01/2014  . GERD (gastroesophageal reflux disease) 10/01/2014  . Other bilateral bundle branch block 06/23/2003    SNewman NickelsSPT 12/04/2020, 10:39 AM  [facility] [place] [address]Phone: [phone]  Fax:  [phone] Name: Laura Watson JMartiniqueMRN: [id number] of Birth: [birth year]

## 2020-12-04 NOTE — Patient Instructions (Signed)
  [id number] Update to current HEP;  2x10 alternating overhead press with arms at shoulder level and elbows bent 2 x 10 alternating overhead press with arms at shoulder level and in scaption plane with elbows bent

## 2020-12-15 ENCOUNTER — Encounter: Payer: Self-pay | Admitting: Physical Therapy

## 2020-12-15 ENCOUNTER — Ambulatory Visit: Payer: Medicare HMO | Attending: Family Medicine | Admitting: Physical Therapy

## 2020-12-15 ENCOUNTER — Other Ambulatory Visit: Payer: Self-pay

## 2020-12-15 DIAGNOSIS — M542 Cervicalgia: Secondary | ICD-10-CM

## 2020-12-15 DIAGNOSIS — R293 Abnormal posture: Secondary | ICD-10-CM | POA: Insufficient documentation

## 2020-12-15 DIAGNOSIS — M25512 Pain in left shoulder: Secondary | ICD-10-CM | POA: Diagnosis not present

## 2020-12-15 DIAGNOSIS — M25511 Pain in right shoulder: Secondary | ICD-10-CM | POA: Diagnosis not present

## 2020-12-15 DIAGNOSIS — M6281 Muscle weakness (generalized): Secondary | ICD-10-CM | POA: Insufficient documentation

## 2020-12-15 NOTE — Therapy (Addendum)
[facility] [address]  [place], [place], [address] Phone: [phone]   Fax:  [phone]  Physical Therapy Treatment / Progress Note / Discharge Summary  Patient Details  Name: Laura Bush MRN: [id number] Date of Birth: 06-29-1951 Referring Provider (PT): Gregor Hams, MD  Progress Note  Reporting Period 11/03/2020 to 12/15/2020  See note below for Objective Data and Assessment of Progress/Goals.      Encounter Date: 12/15/2020   PT End of Session - 12/15/20 1014     Visit Number 14    Number of Visits 17    Date for PT Re-Evaluation 12/15/20    Authorization Type [employer]na Medicare    Progress Note Due on Visit --    PT Start Time 1014    PT Stop Time 1037    PT Time Calculation (min) 23 min    Activity Tolerance Patient tolerated treatment well    Behavior During Therapy WFL for tasks assessed/performed             Past Medical History:  Diagnosis Date   Asthma    in cold weather   Breast cancer ([place]) 09/18/14   left breast   Breast cancer ([place]) 10/07/14   bx left breast   Breast cancer of upper-outer quadrant of left female breast ([place]) 09/22/2014   Cluster headaches    in her 40's   GERD (gastroesophageal reflux disease)    Heart murmur    as a child (has outgrown)   High cholesterol    Hypertension    Lower extremity edema    Microscopic colitis    Morbid obesity ([place]) 09/30/2019   Obesity (BMI 30-39.9) 11/11/2020   OSA on CPAP    Personal history of radiation therapy    Pneumonia 2000's X 1   PONV (postoperative nausea and vomiting)    Rosacea    S/P radiation therapy 12/30/14-01/27/15   left breast 50Gy total dose   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"   Swallowing difficulty    Venous insufficiency     Past Surgical History:  Procedure Laterality Date   ABDOMINAL HYSTERECTOMY  1999   APPENDECTOMY  ~ 2006   BREAST BIOPSY Left 10/2014   BREAST LUMPECTOMY Left 2016    BREAST REDUCTION SURGERY Bilateral 11/11/2014   Procedure: Bilateral Breast Reduction;  Surgeon: Crissie Reese, MD;  Location: [facility];  Service: Plastics;  Laterality: Bilateral;   CARPAL TUNNEL RELEASE Bilateral    2 surgeries on right, 1 on left   CESAREAN SECTION  1978; 1980   COLONOSCOPY     FOREARM FRACTURE SURGERY Right 2003 X 3   KNEE ARTHROSCOPY Bilateral    meniscus repair   PARTIAL MASTECTOMY WITH NEEDLE LOCALIZATION AND AXILLARY SENTINEL LYMPH NODE BX Left 11/11/2014   Procedure: LEFT BREAST PARTIAL MASTECTOMY WITH NEEDLE LOCALIZATION TIMES TWO AND LEFT AXILLARY SENTINEL LYMPH NODE Biopsy;  Surgeon: Fanny Skates, MD;  Location: [facility];  Service: General;  Laterality: Left;   SQUAMOUS CELL CARCINOMA EXCISION  1980's X 1   "nose"   TONSILLECTOMY  ~ [place]    There were no vitals filed for this visit.   Subjective Assessment - 12/15/20 1016     Subjective Pt reports she is no longer having any muscle pain although she still has occasional joint pain in her shoulder from the OA but usually able to manage this with Tyelnol. She feels ready to  transition to the HEP as planned.    Diagnostic tests 09/17/20 - shoulder x-rays: L - Mild AC joint degenerative  changes; R - Mild glenohumeral and moderate AC joint degenerative changes    Patient Stated Goals "some direction on movement or motion to do to prevent or alleviate the discomfort"    Currently in Pain? No/denies                [facility] PT Assessment - 12/15/20 1014       Assessment   Medical Diagnosis Chronic pain of B shoulders, upper back & neck    Referring Provider (PT) Gregor Hams, MD    Onset Date/Surgical Date --   Oct 2021   Next MD Visit PRN      Observation/Other Assessments   Focus on Therapeutic Outcomes (FOTO)  Shoulder: FS = 71 (11 point improvement since eval, exceeding predicted discharge FS of 67)      AROM   Overall AROM Comments B shoulder ROM WNL/WFL with no pain      Strength    Right Shoulder Flexion 5/5    Right Shoulder ABduction 5/5    Right Shoulder Internal Rotation 5/5    Right Shoulder External Rotation 5/5    Left Shoulder Flexion 5/5    Left Shoulder ABduction 5/5    Left Shoulder Internal Rotation 5/5    Left Shoulder External Rotation 5/5                           [facility] Adult PT Treatment/Exercise - 12/15/20 1014       Exercises   Exercises Shoulder      Shoulder Exercises: ROM/Strengthening   UBE (Upper Arm Bike) L2.0 x 6 min (3' fwd/3' back)                      PT Short Term Goals - 10/30/20 1111       PT SHORT TERM GOAL #1   Title Patient will be independent with initial HEP    Status Achieved   10/30/20              PT Long Term Goals - 12/15/20 1018       PT LONG TERM GOAL #1   Title Patient will be independent with ongoing/advanced HEP for self-management at home    Status Achieved   12/15/20     PT LONG TERM GOAL #2   Title Improve posture and alignment with patient to demonstrate improved upright posture with posterior shoulder girdle engaged    Status Achieved   11/27/20 - improving awareness and self-correction     PT LONG TERM GOAL #3   Title Patient to improve B shoulder AROM to WNL without pain provocation    Status Achieved   11/27/20     PT LONG TERM GOAL #4   Title Patient will demonstrate improved B shoulder strength to >/= 4+/5 w/o pain provocation for functional UE use    Status Achieved   12/15/20     PT LONG TERM GOAL #5   Title Patient to report ability to perform ADLs, household, and work-related tasks without increased pain    Status Achieved   12/15/20                  Plan - 12/15/20 1019     Clinical Impression Statement [name] is very pleased with her progress reporting no recent muscle pain  and only occasional arthritic joint pain which she can manage with Tylenol and heat. B shoulder ROM WNL and strength 5/5 with no pain reported. No limitations noted with  daily activities and pt stating she has even been able to play basketball with her grandson w/o issues. FOTO functional status currently = 71 exceeding predicted value of 67 and demonstrating an 11-point gain from eval. All goals met and pt feels confident with transition to HEP, denying need for further review of any exercises. Discussed ongoing exercise frequency and exercise progression including progression of theraband resistance with pt acknowledging good understanding. Will proceed with transition to HEP with 30-day hold in the event that issues arise necessitating return to PT.    Comorbidities L breast cancer with partial mastectomy 2016, B breast reduction surgery at time of mastectomy, h/o bone spur R shoulder, B CTR, R forearm fx, B knee scope with meniscal repair, HTN, osteopenia, bilateral BBB, GERD    Rehab Potential Good    PT Treatment/Interventions ADLs/Self Care Home Management;Cryotherapy;Electrical Stimulation;Iontophoresis 25m/ml Dexamethasone;Moist Heat;Functional mobility training;Therapeutic activities;Therapeutic exercise;Neuromuscular re-education;Patient/family education;Manual techniques;Passive range of motion;Dry needling;Taping;Spinal Manipulations    PT Next Visit Plan transition to HEP + 30-day hold    PT Home Exercise Plan MedBridge Access Code: [email or web address](2/23), [email or web address] (3/11), [email or web address] (4/15), [email or web address](4/26; update 4/29)    Consulted and Agree with Plan of Care Patient             Patient will benefit from skilled therapeutic intervention in order to improve the following deficits and impairments:  Pain,Postural dysfunction,Improper body mechanics,Increased fascial restricitons,Increased muscle spasms,Impaired flexibility,Decreased range of motion,Decreased strength,Decreased activity tolerance  Visit Diagnosis: Acute pain of left shoulder  Acute pain of right shoulder  Cervicalgia  Abnormal posture  Muscle weakness (generalized)     Problem  List Patient Active Problem List   Diagnosis Date Noted   Obesity (BMI 30-39.9) 046/27/0350  Metabolic syndrome 009/38/1829  Mixed hyperlipidemia 04/21/2020   Polyp of ascending colon 04/21/2020   History of breast cancer, Tamoxifen, nearing five years, Dr. MGriffith Citron09/14/2021   Osteopenia 10/02/2018   Malignant 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, estrogen receptor positive ([place] 05/29/2017   Ductal carcinoma in situ (DCIS) of right breast 05/29/2017   Vitamin D deficiency 01/29/2016   Eczema 12/29/2014   Venous insufficiency 10/16/2014   Essential hypertension 10/01/2014   Hypercholesteremia 10/01/2014   GERD (gastroesophageal reflux disease) 10/01/2014   Other bilateral bundle branch block 06/23/2003    JPercival Spanish PT, MPT 12/15/2020, 10:49 AM  [facility] [address] [place] [address]Phone: [phone]  Fax:  [phone] Name: Vidalia Watson JMartiniqueMRN: [id number] of Birth: 826-Jul-1952   PHYSICAL THERAPY DISCHARGE SUMMARY  Visits from Start of Care: 14  Current functional level related to goals / functional outcomes:   Refer to above clinical impression for status as of last visit on 12/15/2020. Patient was placed on hold for 30 days and has not needed to return to PT, therefore will proceed with discharge from PT for this episode.   Remaining deficits:   As above.   Education / Equipment:   HEP   Patient agrees to discharge. Patient goals were met. Patient is being discharged due to meeting the stated rehab goals.   JPercival Spanish PT, MPT 02/12/21, 10:43 AM  [facility] [address]  [place], [place], [address] Phone: [phone]   Fax:  [phone]

## 2020-12-16 ENCOUNTER — Encounter: Payer: Self-pay | Admitting: Family Medicine

## 2020-12-22 ENCOUNTER — Ambulatory Visit: Payer: Medicare HMO | Attending: Internal Medicine

## 2020-12-22 DIAGNOSIS — Z23 Encounter for immunization: Secondary | ICD-10-CM

## 2020-12-22 NOTE — Progress Notes (Signed)
   Covid-19 Vaccination Clinic  Name:  Janal Watson [name]    MRN: [id number] DOB: 28-Mar-1951  12/22/2020  Ms. [name] was observed post Covid-19 immunization for 15 minutes without incident. She was provided with Vaccine Information Sheet and instruction to access the V-Safe system.   Ms. [name] was instructed to call 911 with any severe reactions post vaccine: Marland Kitchen Difficulty breathing  . Swelling of face and throat  . A fast heartbeat  . A bad rash all over body  . Dizziness and weakness   Immunizations Administered    Name Date Dose VIS Date Route   Moderna Covid-19 Booster Vaccine 12/22/2020  9:11 AM 0.25 mL 05/27/2020 Intramuscular   Manufacturer: Moderna   Lot: [id number]   [place]: [id number]

## 2020-12-24 ENCOUNTER — Other Ambulatory Visit (HOSPITAL_BASED_OUTPATIENT_CLINIC_OR_DEPARTMENT_OTHER): Payer: Self-pay

## 2020-12-24 MED ORDER — MODERNA COVID-19 VACCINE 100 MCG/0.5ML IM SUSP
INTRAMUSCULAR | 0 refills | Status: DC
  Filled 2020-12-24: qty 0.25, 1d supply, fill #0

## 2021-01-02 ENCOUNTER — Other Ambulatory Visit: Payer: Self-pay | Admitting: Family

## 2021-01-05 ENCOUNTER — Inpatient Hospital Stay: Payer: Medicare HMO | Attending: Family

## 2021-01-05 ENCOUNTER — Other Ambulatory Visit: Payer: Self-pay

## 2021-01-05 ENCOUNTER — Inpatient Hospital Stay (HOSPITAL_BASED_OUTPATIENT_CLINIC_OR_DEPARTMENT_OTHER): Payer: Medicare HMO | Admitting: Family

## 2021-01-05 DIAGNOSIS — Z170 Estrogen receptor positive status [ER+]: Secondary | ICD-10-CM | POA: Diagnosis not present

## 2021-01-05 DIAGNOSIS — D0511 Intraductal carcinoma in situ of right breast: Secondary | ICD-10-CM | POA: Insufficient documentation

## 2021-01-05 DIAGNOSIS — C50412 Malignant neoplasm of upper-outer quadrant of left female breast: Secondary | ICD-10-CM | POA: Insufficient documentation

## 2021-01-05 LAB — CBC WITH DIFFERENTIAL (CANCER CENTER ONLY)
Abs Immature Granulocytes: 0.01 10*3/uL (ref 0.00–0.07)
Basophils Absolute: 0 10*3/uL (ref 0.0–0.1)
Basophils Relative: 1 %
Eosinophils Absolute: 0.3 10*3/uL (ref 0.0–0.5)
Eosinophils Relative: 5 %
HCT: 35.6 % — ABNORMAL LOW (ref 36.0–46.0)
Hemoglobin: 12.1 g/dL (ref 12.0–15.0)
Immature Granulocytes: 0 %
Lymphocytes Relative: 33 %
Lymphs Abs: 1.7 10*3/uL (ref 0.7–4.0)
MCH: 32.5 pg (ref 26.0–34.0)
MCHC: 34 g/dL (ref 30.0–36.0)
MCV: 95.7 fL (ref 80.0–100.0)
Monocytes Absolute: 0.5 10*3/uL (ref 0.1–1.0)
Monocytes Relative: 10 %
Neutro Abs: 2.6 10*3/uL (ref 1.7–7.7)
Neutrophils Relative %: 51 %
Platelet Count: 185 10*3/uL (ref 150–400)
RBC: 3.72 MIL/uL — ABNORMAL LOW (ref 3.87–5.11)
RDW: 12.1 % (ref 11.5–15.5)
WBC Count: 5 10*3/uL (ref 4.0–10.5)
nRBC: 0 % (ref 0.0–0.2)

## 2021-01-05 LAB — CMP (CANCER CENTER ONLY)
ALT: 35 U/L (ref 0–44)
AST: 24 U/L (ref 15–41)
Albumin: 4.3 g/dL (ref 3.5–5.0)
Alkaline Phosphatase: 49 U/L (ref 38–126)
Anion gap: 9 (ref 5–15)
BUN: 21 mg/dL (ref 8–23)
CO2: 26 mmol/L (ref 22–32)
Calcium: 10 mg/dL (ref 8.9–10.3)
Chloride: 103 mmol/L (ref 98–111)
Creatinine: 0.79 mg/dL (ref 0.44–1.00)
GFR, Estimated: >60 mL/min (ref >60)
Glucose, Bld: 99 mg/dL (ref 70–99)
Potassium: 3.8 mmol/L (ref 3.5–5.1)
Sodium: 138 mmol/L (ref 135–145)
Total Bilirubin: 0.5 mg/dL (ref 0.3–1.2)
Total Protein: 6.6 g/dL (ref 6.5–8.1)

## 2021-01-05 NOTE — Patient Instructions (Signed)
Hemochromatosis Hemochromatosis is a condition in which the body stores too much iron. This is also called iron storage disease or iron overload disorder. The extra iron builds up in your joints, heart, liver, pancreas, and other organs, where it can cause damage. There are two forms of this condition; they include:  Hereditary hemochromatosis. Defects (mutations) on certain genes can cause symptoms to develop. With this type of the condition, the body absorbs more iron than it needs from the foods you eat.  Secondary hemochromatosis. With this type of the condition, iron builds up in the body due to other reasons, such as from liver disease or blood transfusions. What are the causes? This condition may be caused by:  Abnormal genes passed down from both parents (inherited).  Receiving blood from a donor (blood transfusion).  Problems with the way the body uses iron in the bone marrow (ineffective erythropoiesis).  Having chronic liver disease, such as hepatitis or liver cancer. What increases the risk? You are more likely to develop this condition if you:  Inherit certain abnormal gene mutations from both parents.  Are white (Caucasian).  Have severe or long-term (chronic) anemia. What are the signs or symptoms? Signs and symptoms can start at any age, but they usually start in middle age. They may include:  Fatigue.  Weakness.  Joint pain and stiffness.  Abdominal pain.  Weight loss.  Skin turning a gray or bronze color.  Loss of interest in sex.  Loss of menstrual periods, in women.  Loss of body hair.  Shortness of breath. As hemochromatosis gets worse, it may damage the liver, heart, or pancreas. This may lead to complications such as:  Diabetes.  Liver cancer.  Abnormal heart rhythms.  Heart failure. How is this diagnosed? This condition may be diagnosed based on:  Your symptoms and medical history.  A physical exam.  Blood tests.  Genetic  testing.  Removal and testing of a sample of liver tissue (liver biopsy). How is this treated? This condition is most often treated with:  Therapeutic phlebotomy. In this procedure, some of your blood is removed periodically in order to reduce the amount of iron in your body. Over time, your body will naturally replace the blood cells that you lose during phlebotomy. At the start of treatment, you may have a unit of blood removed once or twice a week. You will have blood tests during this time to determine when your iron levels return to normal. Once your iron levels are normal, you may only need to have a phlebotomy every few months.  Lifestyle changes. This may include not drinking alcohol and limiting certain items in your diet.  Medicines to remove excess iron (chelation therapy).  Medicines to help treat any related conditions.  Genetic counseling. If you are found to have a gene mutation, other family members may need to be tested for hereditary hemochromatosis.   Follow these instructions at home: Alcohol use  If you have liver damage, do not drink alcohol.  If you do not have liver damage: ? Do not drink alcohol if:  Your health care provider tells you not to drink.  You are pregnant, may be pregnant, or are planning to become pregnant. ? If you drink alcohol, limit how much you have:  0-1 drink a day for women.  0-2 drinks a day for men. ? Be aware of how much alcohol is in your drink. In the [place] one drink equals one typical bottle of beer (12 oz), one-half  glass of wine (5 oz), or one shot of hard liquor (1 oz).   General instructions  Stay active. Exercise for at least 30 minutes on most days of the week.  Take over-the-counter and prescription medicines only as told by your health care provider. This includes vitamins and supplements.  You may need to have frequent blood or urine testing to monitor your condition and to look for complications.  Follow any  instructions as directed by your health care provider regarding dietary restrictions.  Do not: ? Take vitamins or supplements that contain iron. ? Take vitamin C supplements. Vitamin C makes your body absorb more iron from foods. ? Eat raw shellfish or raw fish. Hemochromatosis may increase your chance for infection from these foods.  Keep all follow-up visits as told by your health care provider. This is important. Contact a health care provider if you have:  Fatigue.  Unusual weakness.  Shortness of breath.  Joint pain.  Abdominal pain.  Weight loss. Get help right away if you have:  Chest pain.  Trouble breathing. Summary  Hemochromatosis is a condition in which your body stores too much iron. This is also called iron storage disease or iron overload disorder.  The extra iron builds up in your joints, heart, liver, pancreas, and other organs, where it can cause damage.  Signs and symptoms can start at any age, but they usually start in middle age.  To treat this condition, you will need to have some of your blood removed periodically (therapeutic phlebotomy). Removing some of your blood also removes iron from your body.  You may need to have frequent blood or urine testing to monitor your condition and to look for complications. This information is not intended to replace advice given to you by your health care provider. Make sure you discuss any questions you have with your health care provider. Document Revised: 12/26/2019 Document Reviewed: 12/26/2019 Elsevier Patient Education  [place].

## 2021-01-05 NOTE — Progress Notes (Signed)
Hematology/Oncology Consultation   Name: Laura Bush      MRN: [id number]    Location: Room/bed info not found  Date: 01/05/2021 Time:3:12 PM   REFERRING PHYSICIAN: Lamar Blinks, MD  REASON FOR CONSULT: Elevated ferritin    DIAGNOSIS: Hemochromatosis, heterozygous for the H63D mutation  HISTORY OF PRESENT ILLNESS: Laura Bush is a very pleasant 70 yo caucasian female with recent diagnosis of Hemochromatosis.  She had an elevated ferritin noted and moved for ward with testing in March. This showed her to be heterozygous for the H63D mutation.  Ferritin had been > 200 recently. Today iron saturation is 27% and ferritin 60.  No personal history of thrombotic event, stroke, heart attack or liver disease.  Both her maternal grandmother and uncle passed away from heart attacks in their 25's. She had a hysterectomy in 1999. No history of miscarriage. She was on hormone replacement therapy for 5 years (due to hot flashes) before developing breast cancer.  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 This was treated with generous lumpectomy and bilateral breast reduction. She then had radiation followed by five years of Tamoxifen. She is followed by Dr. Jana Hakim.  Annual mammogram was completed in February 2022 and result was negative.  She had her colonoscopy in September 2021 with Dr. Henrene Pastor. She had 2 benign polyps removed from the ascending colon and cecum as well as diverticulosis of the sigmoid colon and internal hemorrhoids. She will be due again for evaluation in 5 years.  She states that if she strains she will sometimes have a little blood in her stool due to an anal fissure and internal hemorrhoids.  No other blood loss noted. No bruising or petechiae.  She was sick a March and lost her sense of taste and smell. Her Covid testing at that time was negative.  No fever, chills, n/v, cough, rash, dizziness, SOB, chest  pain, palpitations, abdominal pain or changes in bowel or bladder habits.  No tenderness, numbness or tingling in her extremities at this time.  She states that she has swelling in the lower extremities and is followed by Dr. Juleen China [facility]. She states that the fullness in her legs is felt to be due in part to hyperlipidemia rather than lymphedema. No pitting or redness noted on exam. Pedal pulses are 3+.  No history of diabetes or thyroid disease.  She takes metformin for insulin resistance and states she may change to Ozempic at some point.  She avoids sodium as it causes fluid retention.  No smoking or recreational drug use.  She has 2 glasses of wine per week.  She has maintained a good appetite and is staying well hydrated. Her weight is described as stable.  She is able to walk 1.3 miles a day and does yoga twice a week.  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   ROS: All other 10 point review of systems is negative.   PAST MEDICAL HISTORY:   Past Medical History:  Diagnosis Date  . Asthma    in cold weather  . Breast cancer ([place]) 09/18/14   left breast  . Breast cancer ([place]) 10/07/14   bx left breast  . Breast cancer of upper-outer quadrant of left female breast ([place]) 09/22/2014  . Cluster headaches    in her 40's  . GERD (gastroesophageal reflux disease)   .  Heart murmur    as a child (has outgrown)  . High cholesterol   . Hypertension   . Lower extremity edema   . Microscopic colitis   . Morbid obesity ([place]) 09/30/2019  . Obesity (BMI 30-39.9) 11/11/2020  . OSA on CPAP   . Personal history of radiation therapy   . Pneumonia 2000's X 1  . PONV (postoperative nausea and vomiting)   . Rosacea   . S/P radiation therapy 12/30/14-01/27/15   left breast 50Gy total dose  . 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 Swallowing difficulty   . Venous insufficiency     ALLERGIES: Allergies  Allergen  Reactions  . Amlodipine Swelling  . Diclofenac Hypertension  . Lanolin   . Tape Other (See Comments)    Redness (Bandaids also)      MEDICATIONS:  Current Outpatient Medications on File Prior to Visit  Medication Sig Dispense Refill  . ACETAMINOPHEN PO Take 650 mg by mouth 2 (two) times daily as needed (for shoulder and other mild to moderate pain).    Marland Kitchen aMILoride (MIDAMOR) 5 MG tablet Take 1 tablet (5 mg total) by mouth daily. 90 tablet 3  . B Complex-C (B-COMPLEX WITH VITAMIN C) tablet Take 1 tablet by mouth daily.    Marland Kitchen CALCIUM CARBONATE-VITAMIN D PO Take 1 tablet by mouth 2 (two) times daily. 600mg  calcium, D3 (1000)IU    . carvedilol (COREG) 6.25 MG tablet Take 6.25 mg by mouth 2 (two) times daily with a meal.    . COVID-19 mRNA vaccine, Moderna, (MODERNA COVID-19 VACCINE) 100 MCG/0.5ML injection Inject into the muscle. 0.25 mL 0  . Elderberry 575 MG/5ML SYRP Take by mouth daily.    Marland Kitchen Fexofenadine HCl (ALLEGRA PO) Take 1 tablet by mouth daily as needed.    . hydrochlorothiazide (MICROZIDE) 12.5 MG capsule Take 1 capsule (12.5 mg total) by mouth daily. 90 capsule 3  . ibuprofen (ADVIL) 200 MG tablet Take 400 mg by mouth daily as needed (for pain not relieved by Tylenol / acetaminophen).    . metFORMIN (GLUCOPHAGE) 500 MG tablet Take 1 tablet (500 mg total) by mouth 2 (two) times daily. 180 tablet 0  . pantoprazole (PROTONIX) 40 MG tablet Take 1 tablet (40 mg total) by mouth daily. 90 tablet 3  . simvastatin (ZOCOR) 20 MG tablet Take 1 tablet (20 mg total) by mouth daily. 90 tablet 3  . telmisartan (MICARDIS) 80 MG tablet Take 1 tablet (80 mg total) by mouth daily. 90 tablet 3   No current facility-administered medications on file prior to visit.     PAST SURGICAL HISTORY Past Surgical History:  Procedure Laterality Date  . ABDOMINAL HYSTERECTOMY  1999  . APPENDECTOMY  ~ 2006  . BREAST BIOPSY Left 10/2014  . BREAST LUMPECTOMY Left 2016  . BREAST REDUCTION SURGERY Bilateral  11/11/2014   Procedure: Bilateral Breast Reduction;  Surgeon: Crissie Reese, MD;  Location: [facility];  Service: Plastics;  Laterality: Bilateral;  . CARPAL TUNNEL RELEASE Bilateral    2 surgeries on right, 1 on left  . CESAREAN SECTION  1978; 1980  . COLONOSCOPY    . FOREARM FRACTURE SURGERY Right 2003 X 3  . KNEE ARTHROSCOPY Bilateral    meniscus repair  . PARTIAL MASTECTOMY WITH NEEDLE LOCALIZATION AND AXILLARY SENTINEL LYMPH NODE BX Left 11/11/2014   Procedure: LEFT BREAST PARTIAL MASTECTOMY WITH NEEDLE LOCALIZATION TIMES TWO AND LEFT AXILLARY SENTINEL LYMPH NODE Biopsy;  Surgeon: Fanny Skates, MD;  Location: [facility];  Service: General;  Laterality: Left;  . SQUAMOUS CELL CARCINOMA EXCISION  1980's X 1   "nose"  . TONSILLECTOMY  ~ 1957  . TUBAL LIGATION  ?1984    FAMILY HISTORY: Family History  Problem Relation Age of Onset  . Uterine cancer Sister 47  . Stroke Father        deceased  . Heart failure Father   . Hypertension Father   . Dementia Mother        Lives in [place]  . Hypertension Mother   . Hyperlipidemia Mother   . Heart attack Maternal Grandmother   . Hypertension Maternal Grandfather   . Stroke Paternal Grandfather   . Colon cancer Neg Hx   . Esophageal cancer Neg Hx   . Pancreatic cancer Neg Hx   . Stomach cancer Neg Hx   . Liver disease Neg Hx     SOCIAL HISTORY:  reports that she has never smoked. She has never used smokeless tobacco. She reports current alcohol use of about 5.0 standard drinks of alcohol per week. She reports that she does not use drugs.  PERFORMANCE STATUS: The patient's performance status is 0 - Asymptomatic  PHYSICAL EXAM: Most Recent Vital Signs: There were no vitals taken for this visit. BP 124/77 (BP Location: Left Wrist)   Pulse 67   Temp 98 F (36.7 C) (Oral)   Resp 17   Wt 177 lb (80.3 kg)   SpO2 100%   BMI 35.75 kg/m   General Appearance:    Alert, cooperative, no distress, appears stated age  Head:    Normocephalic,  without obvious abnormality, atraumatic  Eyes:    PERRL, conjunctiva/corneas clear, EOM's intact, fundi    benign, both eyes        Throat:   Lips, mucosa, and tongue normal; teeth and gums normal  Neck:   Supple, symmetrical, trachea midline, no adenopathy;    thyroid:  no enlargement/tenderness/nodules; no carotid   bruit or JVD  Back:     Symmetric, no curvature, ROM normal, no CVA tenderness  Lungs:     Clear to auscultation bilaterally, respirations unlabored  Chest Wall:    No tenderness or deformity   Heart:    Regular rate and rhythm, S1 and S2 normal, no murmur, rub   or gallop     Abdomen:     Soft, non-tender, bowel sounds active all four quadrants,    no masses, no organomegaly        Extremities:   Extremities normal, atraumatic, no cyanosis or edema  Pulses:   2+ and symmetric all extremities  Skin:   Skin color, texture, turgor normal, no rashes or lesions  Lymph nodes:   Cervical, supraclavicular, and axillary nodes normal  Neurologic:   CNII-XII intact, normal strength, sensation and reflexes    throughout    LABORATORY DATA:  Results for orders placed or performed in visit on 01/05/21 (from the past 48 hour(s))  CBC with Differential (Cancer Center Only)     Status: Abnormal   Collection Time: 01/05/21  2:53 PM  Result Value Ref Range   WBC Count 5.0 4.0 - 10.5 K/uL   RBC 3.72 (L) 3.87 - 5.11 MIL/uL   Hemoglobin 12.1 12.0 - 15.0 g/dL   HCT 35.6 (L) 36.0 - 46.0 %   MCV 95.7 80.0 - 100.0 fL   MCH 32.5 26.0 - 34.0 pg   MCHC 34.0 30.0 - 36.0 g/dL   RDW 12.1 11.5 - 15.5 %  Platelet Count 185 150 - 400 K/uL   nRBC 0.0 0.0 - 0.2 %   Neutrophils Relative % 51 %   Neutro Abs 2.6 1.7 - 7.7 K/uL   Lymphocytes Relative 33 %   Lymphs Abs 1.7 0.7 - 4.0 K/uL   Monocytes Relative 10 %   Monocytes Absolute 0.5 0.1 - 1.0 K/uL   Eosinophils Relative 5 %   Eosinophils Absolute 0.3 0.0 - 0.5 K/uL   Basophils Relative 1 %   Basophils Absolute 0.0 0.0 - 0.1 K/uL    Immature Granulocytes 0 %   Abs Immature Granulocytes 0.01 0.00 - 0.07 K/uL    Comment: Performed at [facility] at [facility], [address], [place], [place] [address]      RADIOGRAPHY: No results found.     PATHOLOGY: None  ASSESSMENT/PLAN: Laura Bush is a very pleasant 70 yo caucasian female with recent diagnosis of hemochromatosis, heterozygous for the H63D mutation.  At this time her count remain stable. No phlebotomy needed at this time.  She has never been phlebotomized but states if this is needed she would like to donate with [facility].   We will recheck her lab work in 1 month and plan to follow-up in 2 months.   All questions were answered. The patient knows to call the clinic with any problems, questions or concerns. We can certainly see the patient much sooner if necessary.  The patient was discussed with Dr. Marin Olp and he is in agreement with the aforementioned.   Laverna Peace, NP

## 2021-01-06 ENCOUNTER — Telehealth: Payer: Self-pay | Admitting: *Deleted

## 2021-01-06 LAB — IRON AND TIBC
Iron: 100 ug/dL (ref 41–142)
Saturation Ratios: 27 % (ref 21–57)
TIBC: 371 ug/dL (ref 236–444)
UIBC: 271 ug/dL (ref 120–384)

## 2021-01-06 LAB — FERRITIN: Ferritin: 60 ng/mL (ref 11–307)

## 2021-01-06 NOTE — Telephone Encounter (Signed)
-----   Message from Eliezer Bottom, NP sent at 01/06/2021 11:14 AM EDT ----- No phlebotomy needed at this time! Counts are quite stable! We will check again in a month! Thank you :)  Sarah  ----- Message ----- From: Buel Ream, Lab [facility] [place] Sent: 01/05/2021   3:10 PM EDT To: Eliezer Bottom, NP

## 2021-01-07 ENCOUNTER — Telehealth: Payer: Self-pay | Admitting: Pharmacist

## 2021-01-07 NOTE — Chronic Care Management (AMB) (Signed)
Chronic Care Management Pharmacy Assistant   Name: Laura Bush  MRN: [id number] DOB: [birth year]   Reason for Encounter: Disease State Hypertension    Recent office visits:  None noted  Recent consult visits:  01/05/21 Laverna Peace, NP (Oncology) Patient was seen for hemochromatosis.  11/30/20 Briscoe Deutscher, DO (Weight management)Follow up on weight loss.   11/11/2020 Skeet Latch, MD (Cardiology) Follow up.   10/29/20 Lynne Leader, MD (Sports Medicine) Follow up shoulder pain.  Hospital visits:  None in previous 6 months  Medications: Outpatient Encounter Medications as of 01/07/2021  Medication Sig  . ACETAMINOPHEN PO Take 650 mg by mouth 2 (two) times daily as needed (for shoulder and other mild to moderate pain).  Marland Kitchen aMILoride (MIDAMOR) 5 MG tablet Take 1 tablet (5 mg total) by mouth daily.  . B Complex-C (B-COMPLEX WITH VITAMIN C) tablet Take 1 tablet by mouth daily.  Marland Kitchen CALCIUM CARBONATE-VITAMIN D PO Take 1 tablet by mouth 2 (two) times daily. 600mg  calcium, D3 (1000)IU  . carvedilol (COREG) 6.25 MG tablet Take 6.25 mg by mouth 2 (two) times daily with a meal.  . COVID-19 mRNA vaccine, Moderna, (MODERNA COVID-19 VACCINE) 100 MCG/0.5ML injection Inject into the muscle.  Kendall Flack 575 MG/5ML SYRP Take by mouth daily.  Marland Kitchen Fexofenadine HCl (ALLEGRA PO) Take 1 tablet by mouth daily as needed.  . hydrochlorothiazide (MICROZIDE) 12.5 MG capsule Take 1 capsule (12.5 mg total) by mouth daily.  Marland Kitchen ibuprofen (ADVIL) 200 MG tablet Take 400 mg by mouth daily as needed (for pain not relieved by Tylenol / acetaminophen).  . metFORMIN (GLUCOPHAGE) 500 MG tablet Take 1 tablet (500 mg total) by mouth 2 (two) times daily.  . pantoprazole (PROTONIX) 40 MG tablet Take 1 tablet (40 mg total) by mouth daily.  . simvastatin (ZOCOR) 20 MG tablet Take 1 tablet (20 mg total) by mouth daily.  Marland Kitchen telmisartan (MICARDIS) 80 MG tablet Take 1 tablet (80 mg total) by mouth daily.   No  facility-administered encounter medications on file as of 01/07/2021.    Reviewed chart prior to disease state call. Spoke with patient regarding BP  Recent Office Vitals: BP Readings from Last 3 Encounters:  01/05/21 124/77  11/30/20 124/74  11/11/20 112/66   Pulse Readings from Last 3 Encounters:  01/05/21 67  11/30/20 (!) 56  11/11/20 63    Wt Readings from Last 3 Encounters:  01/05/21 177 lb (80.3 kg)  11/30/20 170 lb (77.1 kg)  11/11/20 175 lb 9.6 oz (79.7 kg)     Kidney Function Lab Results  Component Value Date/Time   CREATININE 0.79 01/05/2021 02:53 PM   CREATININE 0.72 10/05/2020 10:59 AM   CREATININE 0.91 06/16/2020 12:56 PM   CREATININE 0.81 04/06/2020 10:59 AM   CREATININE 0.8 05/29/2017 10:02 AM   CREATININE 0.8 05/30/2016 11:11 AM   GFR 85.23 10/05/2020 10:59 AM   GFRNONAA >60 01/05/2021 02:53 PM   GFRAA >60 10/03/2019 01:11 PM    BMP Latest Ref Rng & Units 01/05/2021 10/05/2020 06/16/2020  Glucose 70 - 99 mg/dL 99 95 123(H)  BUN 8 - 23 mg/dL 21 19 26(H)  Creatinine 0.44 - 1.00 mg/dL 0.79 0.72 0.91  BUN/Creat Ratio 6 - 22 (calc) - - -  Sodium 135 - 145 mmol/L 138 141 139  Potassium 3.5 - 5.1 mmol/L 3.8 4.5 4.7  Chloride 98 - 111 mmol/L 103 104 103  CO2 22 - 32 mmol/L 26 30 31   Calcium 8.9 - 10.3 mg/dL 10.0  10.0 9.5    . Current antihypertensive regimen:  o Amiloride 5 mg take 1 tablet daily o Carvedilol 6.25 mg take 1 tablet two times daily o Hydrochlorothiazide 12.5 mg take 1 capsule daily o telmisartan 80 mg take 1 tablet daily  . How often are you checking your Blood Pressure?  every 3 days  . Current home BP readings: 124/77 01/05/21  . What recent interventions/DTPs have been made by any provider to improve Blood Pressure control since last CPP Visit: None noted  . Any recent hospitalizations or ED visits since last visit with CPP? No   . What diet changes have been made to improve Blood Pressure Control?  o Patient states she limits her  sodium.  . What exercise is being done to improve your Blood Pressure Control?  o Patient states she walks 1.5 miles every other day and goes to yoga.  Adherence Review: Is the patient currently on ACE/ARB medication? Yes Does the patient have >5 day gap between last estimated fill dates? No  Patient needs refill for carvedilol 6.25 mg to [facility].  Star Rating Drugs: Metformin 500 mg last filled 11/30/20 90 DS Simvastatin 20 mg last filled 12/28/2020 90 DS telmisartan 80 mg last filled 01/03/21 90 DS   [facility] Clinical Pharmacist Assistant [phone]

## 2021-01-11 ENCOUNTER — Ambulatory Visit (INDEPENDENT_AMBULATORY_CARE_PROVIDER_SITE_OTHER): Payer: Medicare HMO | Admitting: Family Medicine

## 2021-01-11 ENCOUNTER — Encounter (INDEPENDENT_AMBULATORY_CARE_PROVIDER_SITE_OTHER): Payer: Self-pay | Admitting: Family Medicine

## 2021-01-11 ENCOUNTER — Other Ambulatory Visit: Payer: Self-pay

## 2021-01-11 VITALS — BP 144/75 | HR 54 | Temp 98.2°F | Ht 59.0 in | Wt 173.0 lb

## 2021-01-11 DIAGNOSIS — R0602 Shortness of breath: Secondary | ICD-10-CM | POA: Diagnosis not present

## 2021-01-11 DIAGNOSIS — R7301 Impaired fasting glucose: Secondary | ICD-10-CM

## 2021-01-11 DIAGNOSIS — I10 Essential (primary) hypertension: Secondary | ICD-10-CM

## 2021-01-11 DIAGNOSIS — Z6839 Body mass index (BMI) 39.0-39.9, adult: Secondary | ICD-10-CM

## 2021-01-11 MED ORDER — OZEMPIC (0.25 OR 0.5 MG/DOSE) 2 MG/1.5ML ~~LOC~~ SOPN: 0.2500 mg | PEN_INJECTOR | SUBCUTANEOUS | 0 refills | Status: DC

## 2021-01-11 NOTE — Progress Notes (Signed)
Chief Complaint:   OBESITY Carrianne is here to discuss her progress with her obesity treatment plan along with follow-up of her obesity related diagnoses. See Medical Weight Management Flowsheet for bioelectrical impedance results.  Today's visit was #: 71 Starting weight: 196 lbs Starting date: 10/17/2019 Today's weight: 173 lbs Today's date: 01/11/2021 Weight change since last visit: +3 lbs Total lbs lost to date: 23 lbs Body mass index is 34.94 kg/m.  Total weight loss percentage to date: -11.73%  Interim History: Sheilyn has had 6 birthday celebrations and a vacation this month. Nutrition Plan: the [place] with chicken. Activity:  Walking/yoga for 60 minutes 5 times per week.  Assessment/Plan:   1. Essential hypertension Elevated today.  Medications: Coreg 6.25 mg twice daily, HCTZ 12.5 mg daily, telmisartan 80 mg daily, amiloride 5 mg daily.  She took her medication at 6:15 (1 hour ago).  No edema  Plan: Avoid buying foods that are: processed, frozen, or prepackaged to avoid excess salt.    BP Readings from Last 3 Encounters:  01/11/21 (!) 144/75  01/05/21 124/77  11/30/20 124/74   Lab Results  Component Value Date   CREATININE 0.79 01/05/2021   2. Impaired fasting glucose After discussion, patient would like to start below medication. Expectations, risks, and potential side effects reviewed.  Winola will start Ozempic 0.25 mg subcutaneously weekly, as per below.  - Start Semaglutide,0.25 or 0.5MG /DOS, (OZEMPIC, 0.25 OR 0.5 MG/DOSE,) 2 MG/1.5ML SOPN; Inject 0.25 mg into the skin once a week.  Dispense: 1.5 mL; Refill: 0  3. Other hemochromatosis New. Followed by Heme/Onc. Reviewed notes.  4. SOB (shortness of breath) on exertion Willean has been having more shortness of breath than usual recently. New IC today.  5. Obesity, current BMI 34.9  Course: Estelle is currently in the action stage of change. As such, her goal is to continue with weight loss  efforts.   Nutrition goals: She has agreed to keeping a food journal and adhering to recommended goals of 1200-1400 calories and 85 grams of protein.   Exercise goals: For substantial health benefits, adults should do at least 150 minutes (2 hours and 30 minutes) a week of moderate-intensity, or 75 minutes (1 hour and 15 minutes) a week of vigorous-intensity aerobic physical activity, or an equivalent combination of moderate- and vigorous-intensity aerobic activity. Aerobic activity should be performed in episodes of at least 10 minutes, and preferably, it should be spread throughout the week.  Behavioral modification strategies: increasing lean protein intake, decreasing simple carbohydrates, increasing vegetables, increasing water intake, and decreasing liquid calories.  Varnell has agreed to follow-up with our clinic in 3-4 weeks. She was informed of the importance of frequent follow-up visits to maximize her success with intensive lifestyle modifications for her multiple health conditions.   Objective:   Blood pressure (!) 144/75, pulse (!) 54, temperature 98.2 F (36.8 C), temperature source Oral, height 4\' 11"  (1.499 m), weight 173 lb (78.5 kg), SpO2 98 %. Body mass index is 34.94 kg/m.  General: Cooperative, alert, well developed, in no acute distress. HEENT: Conjunctivae and lids unremarkable. Cardiovascular: Regular rhythm.  Lungs: Normal work of breathing. Neurologic: No focal deficits.   Lab Results  Component Value Date   CREATININE 0.79 01/05/2021   BUN 21 01/05/2021   NA 138 01/05/2021   K 3.8 01/05/2021   CL 103 01/05/2021   CO2 26 01/05/2021   Lab Results  Component Value Date   ALT 35 01/05/2021   AST 24  01/05/2021   ALKPHOS 49 01/05/2021   BILITOT 0.5 01/05/2021   Lab Results  Component Value Date   HGBA1C 5.2 04/06/2020   HGBA1C 5.6 04/03/2019   HGBA1C 5.6 03/28/2018   Lab Results  Component Value Date   INSULIN 12.6 10/17/2019   Lab Results   Component Value Date   TSH 0.88 10/05/2020   Lab Results  Component Value Date   CHOL 156 10/05/2020   HDL 54.30 10/05/2020   LDLCALC 66 10/05/2020   TRIG 182.0 (H) 10/05/2020   CHOLHDL 3 10/05/2020   Lab Results  Component Value Date   WBC 5.0 01/05/2021   HGB 12.1 01/05/2021   HCT 35.6 (L) 01/05/2021   MCV 95.7 01/05/2021   PLT 185 01/05/2021   Lab Results  Component Value Date   IRON 100 01/05/2021   TIBC 371 01/05/2021   FERRITIN 60 01/05/2021   Obesity Behavioral Intervention:   Approximately 15 minutes were spent on the discussion below.  ASK: We discussed the diagnosis of obesity with [name] today and Nathaniel agreed to give [place] permission to discuss obesity behavioral modification therapy today.  ASSESS: Laurette has the diagnosis of obesity and her BMI today is 34.9. Edom is in the action stage of change.   ADVISE: Randalyn was educated on the multiple health risks of obesity as well as the benefit of weight loss to improve her health. She was advised of the need for long term treatment and the importance of lifestyle modifications to improve her current health and to decrease her risk of future health problems.  AGREE: Multiple dietary modification options and treatment options were discussed and Aseneth agreed to follow the recommendations documented in the above note.  ARRANGE: Ariany was educated on the importance of frequent visits to treat obesity as outlined per CMS and USPSTF guidelines and agreed to schedule her next follow up appointment today.  Attestation Statements:   Reviewed by clinician on day of visit: allergies, medications, problem list, medical history, surgical history, family history, social history, and previous encounter notes.  I, Water quality scientist, CMA, am acting as transcriptionist for Briscoe Deutscher, DO  I have reviewed the above documentation for accuracy and completeness, and I agree with the above. Briscoe Deutscher, DO

## 2021-01-18 ENCOUNTER — Ambulatory Visit (INDEPENDENT_AMBULATORY_CARE_PROVIDER_SITE_OTHER): Payer: Medicare HMO | Admitting: Family Medicine

## 2021-01-20 ENCOUNTER — Encounter: Payer: Self-pay | Admitting: Family Medicine

## 2021-01-20 NOTE — Telephone Encounter (Signed)
I called Laura Bush back- her dentist was concerned about left sided facial paralysis  She has felt a bit off the last couple of weeks She also has noted more tearing of her left eye She feels like she looks normal when she looks in the mirror   She has felt a bit tired the last couple of months   She has been biting the inside of her left lower lip for about 2 weeks No slurred speech and no arm drift - had patient test just now at home and she reports negative  She just completed PT for her left shoulder  Pt notes facial symmetry in the mirror -normal smile, eyebrow raise and eye closed.  She denies any numbness or weakness in either side of her body  I offered to have her seen in the ER today on the small chance that this could be a stroke.  She declines, would like to see me tomorrow.  We will see her at noon

## 2021-01-21 ENCOUNTER — Encounter: Payer: Self-pay | Admitting: Family Medicine

## 2021-01-21 ENCOUNTER — Other Ambulatory Visit: Payer: Self-pay

## 2021-01-21 ENCOUNTER — Ambulatory Visit (INDEPENDENT_AMBULATORY_CARE_PROVIDER_SITE_OTHER): Payer: Medicare HMO | Admitting: Family Medicine

## 2021-01-21 VITALS — BP 142/80 | HR 56 | Temp 97.4°F | Resp 16 | Ht 59.0 in | Wt 173.0 lb

## 2021-01-21 DIAGNOSIS — R29810 Facial weakness: Secondary | ICD-10-CM | POA: Diagnosis not present

## 2021-01-21 DIAGNOSIS — I10 Essential (primary) hypertension: Secondary | ICD-10-CM

## 2021-01-21 MED ORDER — CARVEDILOL 6.25 MG PO TABS: 6.2500 mg | ORAL_TABLET | Freq: Two times a day (BID) | ORAL | 3 refills | Status: DC

## 2021-01-21 NOTE — Progress Notes (Signed)
[facility] [address], [place], [place] [address] [phone]  Date:  01/21/2021   Name:  Laura Bush   DOB:  11-08-1950   MRN:  [id number]  PCP:  Darreld Mclean, MD    Chief Complaint: Facial Numbness   History of Present Illness:  Laura Bush is a 70 y.o. very pleasant female patient who presents with the following: Last visit with myself was in February - PMH includes hypertension, venous insufficiency, GERD, hypercholesterolemia, and morbid obesity,  breast cancer diagnosed 2016 followed by oncology, hemochromatosis being managed by hematology - recent dx   [facility]  Pt contacted me with concern of lip biting- phone message as below I called [name] back- her dentist was concerned about left sided facial paralysis She has felt a bit off the last couple of weeks She also has noted more tearing of her left eye She feels like she looks normal when she looks in the mirror She has felt a bit tired the last couple of months She has been biting the inside of her left lower lip for about 2 weeks No slurred speech and no arm drift - had patient test just now at home and she reports negative She just completed PT for her left shoulder Pt notes facial symmetry in the mirror -normal smile, eyebrow raise and eye closed.  She denies any numbness or weakness in either side of her body I offered to have her seen in the ER today on the small chance that this could be a stroke.  She declines, would like to see me tomorrow.  We will see her at noon   She is using tylenol , ibuprofen, Voltaren gel as needed for a left shoulder injury.  Otherwise, she has felt basically well.  Over the last few months she has had a couple of episodes where she felt more tired than usual, but no more focal symptoms.  Neither she nor her husband had noticed any episodes of slurred speech,  confusion, or any one-sided weakness or numbness of her body  Her face looks normal to her in the mirror.  However, she does feel like her left cheek feels a bit puffier than the right She had just a dental cleaning yesterday at her dentist office, no procedures or injections  No shortness of breath or chest pain  No history of TIA or stroke Patient Active Problem List   Diagnosis Date Noted   Hemochromatosis 01/21/2021   Obesity (BMI 30-39.9) 31/49/7026   Metabolic syndrome 37/85/8850   Mixed hyperlipidemia 04/21/2020   Polyp of ascending colon 04/21/2020   History of breast cancer, Tamoxifen, nearing five years, Dr. 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   Vitamin D deficiency 01/29/2016   Eczema 12/29/2014   Venous insufficiency 10/16/2014   Essential hypertension 10/01/2014   Hypercholesteremia 10/01/2014   GERD (gastroesophageal reflux disease) 10/01/2014   Other bilateral bundle branch block 06/23/2003    Past Medical History:  Diagnosis Date   Asthma    in cold weather   Breast cancer ([place]) 09/18/14   left breast   Breast cancer ([place]) 10/07/14   bx left breast   Breast cancer of upper-outer quadrant of left female  breast ([place]) 09/22/2014   Cluster headaches    in her 40's   GERD (gastroesophageal reflux disease)    Heart murmur    as a child (has outgrown)   High cholesterol    Hypertension    Lower extremity edema    Microscopic colitis    Morbid obesity ([place]) 09/30/2019   Obesity (BMI 30-39.9) 11/11/2020   OSA on CPAP    Personal history of radiation therapy    Pneumonia 2000's X 1   PONV (postoperative nausea and vomiting)    Rosacea    S/P radiation therapy 12/30/14-01/27/15   left breast 50Gy total dose   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"   Swallowing difficulty    Venous  insufficiency     Past Surgical History:  Procedure Laterality Date   ABDOMINAL HYSTERECTOMY  1999   APPENDECTOMY  ~ 2006   BREAST BIOPSY Left 10/2014   BREAST LUMPECTOMY Left 2016   BREAST REDUCTION SURGERY Bilateral 11/11/2014   Procedure: Bilateral Breast Reduction;  Surgeon: Crissie Reese, MD;  Location: [facility];  Service: Plastics;  Laterality: Bilateral;   CARPAL TUNNEL RELEASE Bilateral    2 surgeries on right, 1 on left   CESAREAN SECTION  1978; [place] Right 2003 X 3   KNEE ARTHROSCOPY Bilateral    meniscus repair   PARTIAL MASTECTOMY WITH NEEDLE LOCALIZATION AND AXILLARY SENTINEL LYMPH NODE BX Left 11/11/2014   Procedure: LEFT BREAST PARTIAL MASTECTOMY WITH NEEDLE LOCALIZATION TIMES TWO AND LEFT AXILLARY SENTINEL LYMPH NODE Biopsy;  Surgeon: Fanny Skates, MD;  Location: [facility];  Service: General;  Laterality: Left;   SQUAMOUS CELL CARCINOMA EXCISION  1980's X 1   "nose"   TONSILLECTOMY  ~ 1957   TUBAL LIGATION  ?1984    Social History   Tobacco Use   Smoking status: Never   Smokeless tobacco: Never  Substance Use Topics   Alcohol use: Yes    Alcohol/week: 5.0 standard drinks    Types: 5 Glasses of wine per week   Drug use: No    Family History  Problem Relation Age of Onset   Uterine cancer Sister 30   Stroke Father        deceased   Heart failure Father    Hypertension Father    Dementia Mother        Lives in [place]   Hypertension Mother    Hyperlipidemia Mother    Heart attack Maternal Grandmother    Hypertension Maternal Grandfather    Stroke Paternal Grandfather    Colon cancer Neg Hx    Esophageal cancer Neg Hx    Pancreatic cancer Neg Hx    Stomach cancer Neg Hx    Liver disease Neg Hx     Allergies  Allergen Reactions   Amlodipine Swelling   Diclofenac Hypertension   Lanolin    Tape Other (See Comments)    Redness (Bandaids also)    Medication list has been reviewed and updated.  Current  Outpatient Medications on File Prior to Visit  Medication Sig Dispense Refill   ACETAMINOPHEN PO Take 650 mg by mouth 2 (two) times daily as needed (for shoulder and other mild to moderate pain).     aMILoride (MIDAMOR) 5 MG tablet Take 1 tablet (5 mg total) by mouth daily. 90 tablet 3   B Complex-C (B-COMPLEX WITH VITAMIN C) tablet Take 1 tablet by mouth daily.     CALCIUM CARBONATE-VITAMIN  D PO Take 1 tablet by mouth 2 (two) times daily. 600mg  calcium, D3 (1000)IU     carvedilol (COREG) 6.25 MG tablet Take 6.25 mg by mouth 2 (two) times daily with a meal.     COVID-19 mRNA vaccine, Moderna, (MODERNA COVID-19 VACCINE) 100 MCG/0.5ML injection Inject into the muscle. 0.25 mL 0   Elderberry 575 MG/5ML SYRP Take by mouth daily.     Fexofenadine HCl (ALLEGRA PO) Take 1 tablet by mouth daily as needed.     hydrochlorothiazide (MICROZIDE) 12.5 MG capsule Take 1 capsule (12.5 mg total) by mouth daily. 90 capsule 3   ibuprofen (ADVIL) 200 MG tablet Take 400 mg by mouth daily as needed (for pain not relieved by Tylenol / acetaminophen).     pantoprazole (PROTONIX) 40 MG tablet Take 1 tablet (40 mg total) by mouth daily. 90 tablet 3   Semaglutide,0.25 or 0.5MG /DOS, (OZEMPIC, 0.25 OR 0.5 MG/DOSE,) 2 MG/1.5ML SOPN Inject 0.25 mg into the skin once a week. 1.5 mL 0   simvastatin (ZOCOR) 20 MG tablet Take 1 tablet (20 mg total) by mouth daily. 90 tablet 3   telmisartan (MICARDIS) 80 MG tablet Take 1 tablet (80 mg total) by mouth daily. 90 tablet 3   No current facility-administered medications on file prior to visit.    Review of Systems:  As per HPI- otherwise negative.   Physical Examination: Vitals:   01/21/21 1153  BP: (!) 142/80  Pulse: (!) 56  Resp: 16  Temp: (!) 97.4 F (36.3 C)  SpO2: 98%   Vitals:   01/21/21 1153  Weight: 173 lb (78.5 kg)  Height: 4\' 11"  (1.499 m)   Body mass index is 34.94 kg/m. Ideal Body Weight: Weight in (lb) to have BMI = 25: 123.5  GEN: no acute distress.   Obese, otherwise looks well HEENT: Atraumatic, Normocephalic.   Bilateral TM wnl, posterior oropharynx shows asymmetry, with the left side of the oropharynx lower than the right.  Patient is not sure if this is new or baseline She also has a couple of bite marks on the lingual aspect of the left lower lip. PEERL,EOMI.  Otherwise, neurologic exam is completely normal.  Patient has normal strength and sensation of all limbs, normal facial motion and strength.  No facial asymmetry is noted.  She has normal Romberg and tandem stance testing.  Normal deep tendon reflexes of all limbs Ears and Nose: No external deformity. CV: RRR, No M/G/R. No JVD. No thrill. No extra heart sounds. PULM: CTA B, no wheezes, crackles, rhonchi. No retractions. No resp. distress. No accessory muscle use. ABD: S, NT, ND, +BS. No rebound. No HSM. EXTR: No c/c/e PSYCH: Normally interactive. Conversant.    Assessment and Plan: Facial droop - Plan: MR Brain Wo Contrast  Hereditary hemochromatosis ([place])  Essential hypertension - Plan: carvedilol (COREG) 6.25 MG tablet  Marcie Bal is here today with concern of possible neurological change/concern for TIA or stroke versus Bell's palsy.  Yesterday at her dentist she was noted to have bitten her left lip several times, she showed some drooping of her left face.  On exam however, no facial paralysis or droop is evident-the only abnormality noted is an asymmetry of her posterior oropharynx.  While this may be significant, it also could possibly be baseline.  Also do not see compelling evidence of Bell's palsy at this time  I discussed the situation in detail with patient.  Her father did suffer a stroke in the past.  She would prefer  to do an MRI to look for any evidence of CVA.  We will set this up for her as a stat imaging study.  In the meantime, she will watch for any change or worsening of her symptoms  Hemochromatosis is being managed by hematology, recent ferritin looked  okay  Blood pressure under reasonable control  BP Readings from Last 3 Encounters:  01/21/21 (!) 142/80  01/11/21 (!) 144/75  01/05/21 124/77     Signed Lamar Blinks, MD

## 2021-01-23 ENCOUNTER — Ambulatory Visit (HOSPITAL_BASED_OUTPATIENT_CLINIC_OR_DEPARTMENT_OTHER)
Admission: RE | Admit: 2021-01-23 | Discharge: 2021-01-23 | Disposition: A | Discharge status: Home / Self Care | Payer: Medicare HMO | Source: Ambulatory Visit | Attending: Family Medicine | Admitting: Family Medicine

## 2021-01-23 ENCOUNTER — Other Ambulatory Visit: Payer: Self-pay

## 2021-01-23 DIAGNOSIS — R29810 Facial weakness: Secondary | ICD-10-CM | POA: Insufficient documentation

## 2021-01-24 ENCOUNTER — Encounter: Payer: Self-pay | Admitting: Family Medicine

## 2021-01-26 ENCOUNTER — Other Ambulatory Visit: Payer: Self-pay | Admitting: Family Medicine

## 2021-01-26 DIAGNOSIS — R29810 Facial weakness: Secondary | ICD-10-CM

## 2021-02-04 ENCOUNTER — Other Ambulatory Visit: Payer: Self-pay

## 2021-02-04 ENCOUNTER — Inpatient Hospital Stay: Payer: Medicare HMO | Attending: Family

## 2021-02-04 LAB — CBC WITH DIFFERENTIAL (CANCER CENTER ONLY)
Abs Immature Granulocytes: 0.01 10*3/uL (ref 0.00–0.07)
Basophils Absolute: 0 10*3/uL (ref 0.0–0.1)
Basophils Relative: 1 %
Eosinophils Absolute: 0.2 10*3/uL (ref 0.0–0.5)
Eosinophils Relative: 4 %
HCT: 38.3 % (ref 36.0–46.0)
Hemoglobin: 12.9 g/dL (ref 12.0–15.0)
Immature Granulocytes: 0 %
Lymphocytes Relative: 23 %
Lymphs Abs: 0.9 10*3/uL (ref 0.7–4.0)
MCH: 32.6 pg (ref 26.0–34.0)
MCHC: 33.7 g/dL (ref 30.0–36.0)
MCV: 96.7 fL (ref 80.0–100.0)
Monocytes Absolute: 0.8 10*3/uL (ref 0.1–1.0)
Monocytes Relative: 21 %
Neutro Abs: 1.9 10*3/uL (ref 1.7–7.7)
Neutrophils Relative %: 51 %
Platelet Count: 169 10*3/uL (ref 150–400)
RBC: 3.96 MIL/uL (ref 3.87–5.11)
RDW: 11.9 % (ref 11.5–15.5)
WBC Count: 3.7 10*3/uL — ABNORMAL LOW (ref 4.0–10.5)
nRBC: 0 % (ref 0.0–0.2)

## 2021-02-04 LAB — CMP (CANCER CENTER ONLY)
ALT: 24 U/L (ref 0–44)
AST: 20 U/L (ref 15–41)
Albumin: 4.4 g/dL (ref 3.5–5.0)
Alkaline Phosphatase: 52 U/L (ref 38–126)
Anion gap: 6 (ref 5–15)
BUN: 23 mg/dL (ref 8–23)
CO2: 30 mmol/L (ref 22–32)
Calcium: 10.2 mg/dL (ref 8.9–10.3)
Chloride: 103 mmol/L (ref 98–111)
Creatinine: 0.84 mg/dL (ref 0.44–1.00)
GFR, Estimated: >60 mL/min (ref >60)
Glucose, Bld: 94 mg/dL (ref 70–99)
Potassium: 4.5 mmol/L (ref 3.5–5.1)
Sodium: 139 mmol/L (ref 135–145)
Total Bilirubin: 0.6 mg/dL (ref 0.3–1.2)
Total Protein: 6.9 g/dL (ref 6.5–8.1)

## 2021-02-04 LAB — FERRITIN: Ferritin: 67 ng/mL (ref 11–307)

## 2021-02-04 LAB — IRON AND TIBC
Iron: 85 ug/dL (ref 41–142)
Saturation Ratios: 23 % (ref 21–57)
TIBC: 364 ug/dL (ref 236–444)
UIBC: 279 ug/dL (ref 120–384)

## 2021-02-09 ENCOUNTER — Encounter: Payer: Self-pay | Admitting: Neurology

## 2021-02-09 ENCOUNTER — Ambulatory Visit: Payer: Medicare HMO | Admitting: Neurology

## 2021-02-09 VITALS — BP 139/80 | HR 64 | Ht 59.0 in | Wt 174.0 lb

## 2021-02-09 DIAGNOSIS — I69392 Facial weakness following cerebral infarction: Secondary | ICD-10-CM

## 2021-02-09 DIAGNOSIS — G4733 Obstructive sleep apnea (adult) (pediatric): Secondary | ICD-10-CM

## 2021-02-09 DIAGNOSIS — I6381 Other cerebral infarction due to occlusion or stenosis of small artery: Secondary | ICD-10-CM | POA: Diagnosis not present

## 2021-02-09 DIAGNOSIS — Z820 Family history of epilepsy and other diseases of the nervous system: Secondary | ICD-10-CM | POA: Diagnosis not present

## 2021-02-09 DIAGNOSIS — Z85841 Personal history of malignant neoplasm of brain: Secondary | ICD-10-CM

## 2021-02-09 DIAGNOSIS — Z9989 Dependence on other enabling machines and devices: Secondary | ICD-10-CM | POA: Diagnosis not present

## 2021-02-09 NOTE — Patient Instructions (Addendum)
It was nice to see you again today.  You have a slight degree of weakness and possible numbness around the left lower face, and the area below the right nostril and below the mouth, and the chin area.  Otherwise, your exam is benign and reassuring.  It is not impossible that you had a tiny stroke.  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  It is not impossible that these 2 findings are related.   Continue exercising regularly and take your medications as directed. As discussed, secondary prevention is key after a stroke. This means: taking care of blood sugar values or diabetes management (A1c goal of less than 7.0), good blood pressure (hypertension) control and optimizing cholesterol management (with LDL goal of less than 70), exercising daily or regularly within your own mobility limitations of course, and overall cardiovascular risk factor reduction, which includes screening for and treatment of obstructive sleep apnea (OSA) and weight management.   Please start taking a baby aspirin.  Please continue to hydrate well and especially on a plane trip, try to wear compression socks and hydrate really well, walk around if possible, at least to the bathroom on a regular basis.  We will do some additional testing for completion, I would like to do a repeat brain MRI with contrast given your remote history of breast cancer.  In addition, we will do an echocardiogram which is an ultrasound of the heart and an ultrasound of your main neck arteries.  We will call you with the results and plan a follow-up in this clinic in about 3 months.

## 2021-02-09 NOTE — Progress Notes (Signed)
Subjective:    Patient ID: Laura Bush is a 70 y.o. female.  HPI    Interim history:   Dear Dr. Lorelei Pont,  I saw your patient, Laura Bush, upon your kind request in my neurologic clinic today for evaluation of her facial droop. The patient Is unaccompanied today.  I have previously evaluated her for obstructive sleep apnea and saw her on 04/11/2019 for this.  She reports that she had been noticed to have a slight facial weakness on the left side by her dentist.  She was at a routine appointment in mid June 2022 and was noted to have a left lower facial weakness.  She herself had not noticed it but did notice that she had bitten the inside of the left lower lip a couple of times around 06 January 2021.  She did not notice any weakness elsewhere or numbness.  Just recently a few days ago when she was working on a tile floor she noticed mild numbness in the left digits 4 and 5 but when she shook her hand it went away.  She has not fallen.  She did have a slight tripping incident in the kitchen when she was wearing flip-flops and she did not pick up her left foot as well.  Overall, she feels at baseline but wanted to get checked out for these issues.  She reports a family history of brain hemorrhage.  Her father had a hemorrhagic stroke and died after the second hemorrhagic stroke.  Not sure if he had a brain aneurysm.  Patient is working on healthy lifestyle and weight loss.  She has been going to the weight management clinic and recently started on Ozempic.  She is trying to hydrate well.  She limits her alcohol to 1 or 2 glasses of wine per week.  She does not smoke.   I reviewed your office note from 01/21/2021.  You ordered a brain MRI.  She had a brain MRI without contrast on 01/23/2021 and I reviewed the results: IMPRESSION: 1. No acute or focal abnormality to explain the patient's symptoms. 2. Scattered subcortical T2 hyperintensities bilaterally are mildly advanced for age. The finding is  nonspecific but can be seen in the setting of chronic microvascular ischemia, a demyelinating process such as multiple sclerosis, vasculitis, complicated migraine headaches, or as the sequelae of a prior infectious or inflammatory process.  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 She had a lipid panel on 10/05/2020 which showed a total cholesterol of 156, triglycerides 182, HDL 54.3, LDL 66.  TSH was 0.88.  She has been compliant with CPAP therapy.  She had an interim home sleep test on 10/14/2020 which showed moderate obstructive sleep apnea with an AHI of 18.8/h, O2 nadir 86%.  I wrote for new AutoPap machine.  She has not had her new machine yet, she reports that it is estimated to be delivered in mid July 2022.  She sees cardiology once a year, she sees her eye doctor once every 1 or 2 years.  She was diagnosed with hemochromatosis recently and follows with hematology.  The patient's allergies, current medications, family history, past medical history, past social history, past surgical history and problem list were reviewed and updated as appropriate.   Previously:  04/11/19:  70 year old right-handed woman with an underlying medical history of hypertension, hyperlipidemia, heart murmur, history of cluster headaches, reflux disease, breast cancer, status post radiation therapy and surgery, on tamoxifen,  asthma, history of rosacea, history of venous insufficiency and pneumonia, and obesity, who reports a prior diagnosis of obstructive sleep apnea.  She has a CPAP machine.  She has not had evaluation in over 5 years.  Her machine is from about 2014 and I was able to review her latest compliance download for the past 30 days from 03/12/2019 through 04/10/2019 during which time she was fully compliant with treatment with an average usage of 8 hours and 3 minutes which is excellent, residual AHI at goal at 1.3/h, average pressure of 12 cm, she is actually  on an AutoPap with a setting of 5 to 15 cm, leak on the low side with a 95th percentile at 3.7 L/min.  Prior sleep study results are not available for my review today.  She reports that she had sleep study testing in 2014 through the [facility].  She moved with her husband in December 2015 to [place] to be closer to their son who was expecting twins.  The twins are about 70 years old now.  She has a daughter in [place] who has a 70-year-old and a 70-year-old.  She sleeps well with her CPAP, she is fully compliant with it.  Her husband also has a CPAP machine and they both get their supplies online.  She has been using nasal pillows successfully.  Her Epworth sleepiness score is 4 out of 24, fatigue severity score is 21 out of 63.  She sleeps without major interruptions, no nocturia or morning headaches are reported.  She generally is in bed around 10 and rise time is between 6 and 7.  She is retired from an [employer] based out of [employer].  Her husband still works.  She had a tonsillectomy at age 70.  Her weight has increased over time.  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  She had subsequent radiation therapy and is in her fifth year of tamoxifen.  She is a non-smoker and drinks a glass of wine essentially daily, caffeine and limitation in the form of coffee, 1 cup/day on average.  They have no pets in the household.    Her Past Medical History Is Significant For: Past Medical History:  Diagnosis Date   Asthma    in cold weather   Breast cancer ([facility]) 09/18/14   left breast   Breast cancer ([facility]) 10/07/14   bx left breast   Breast cancer of upper-outer quadrant of left female breast ([place]) 09/22/2014   Cluster headaches    in her 60's   GERD (gastroesophageal reflux disease)    Heart murmur    as a child (has outgrown)   High cholesterol     Hypertension    Lower extremity edema    Microscopic colitis    Morbid obesity ([place]) 09/30/2019   Obesity (BMI 30-39.9) 11/11/2020   OSA on CPAP    Personal history of radiation therapy    Pneumonia 2000's X 1   PONV (postoperative nausea and vomiting)    Rosacea    S/P radiation therapy 12/30/14-01/27/15   left breast 50Gy total dose   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"   Swallowing difficulty    Venous insufficiency     Her Past Surgical History Is Significant For: Past Surgical History:  Procedure Laterality Date   ABDOMINAL HYSTERECTOMY  1999   APPENDECTOMY  ~  2006   BREAST BIOPSY Left 10/2014   BREAST LUMPECTOMY Left 2016   BREAST REDUCTION SURGERY Bilateral 11/11/2014   Procedure: Bilateral Breast Reduction;  Surgeon: Crissie Reese, MD;  Location: [facility];  Service: Plastics;  Laterality: Bilateral;   CARPAL TUNNEL RELEASE Bilateral    2 surgeries on right, 1 on left   CESAREAN SECTION  1978; 1980   COLONOSCOPY     FOREARM FRACTURE SURGERY Right 2003 X 3   KNEE ARTHROSCOPY Bilateral    meniscus repair   PARTIAL MASTECTOMY WITH NEEDLE LOCALIZATION AND AXILLARY SENTINEL LYMPH NODE BX Left 11/11/2014   Procedure: LEFT BREAST PARTIAL MASTECTOMY WITH NEEDLE LOCALIZATION TIMES TWO AND LEFT AXILLARY SENTINEL LYMPH NODE Biopsy;  Surgeon: Fanny Skates, MD;  Location: [facility];  Service: General;  Laterality: Left;   SQUAMOUS CELL CARCINOMA EXCISION  1980's X 1   "nose"   TONSILLECTOMY  ~ [place]  ?1984    Her Family History Is Significant For: Family History  Problem Relation Age of Onset   Uterine cancer Sister 25   Stroke Father        deceased   Heart failure Father    Hypertension Father    Dementia Mother        Lives in [place]   Hypertension Mother    Hyperlipidemia Mother    Heart attack Maternal Grandmother    Hypertension Maternal Grandfather    Stroke Paternal Grandfather    Colon cancer Neg Hx    Esophageal cancer Neg Hx    Pancreatic cancer  Neg Hx    Stomach cancer Neg Hx    Liver disease Neg Hx     Her Social History Is Significant For: Social History   Socioeconomic History   Marital status: Married    Spouse name: Randy Bush   Number of children: Not on file   Years of education: Not on file   Highest education level: Not on file  Occupational History   Occupation: retired  Tobacco Use   Smoking status: Never   Smokeless tobacco: Never  Vaping Use   Vaping Use: Never used  Substance and Sexual Activity   Alcohol use: Yes    Alcohol/week: 5.0 standard drinks    Types: 5 Glasses of wine per week    Comment: now more like 2 drinks/week   Drug use: No   Sexual activity: Not Currently  Other Topics Concern   Not on file  Social History Narrative   From [place].   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 Update 02/09/2021 retired   2 children, boy and girl   Has 4 grandchildren   Quilting, sewing, reading.   Social Determinants of Health   Financial Resource Strain: Low Risk    Difficulty of Paying Living Expenses: Not hard at all  Food Insecurity: No Food Insecurity   Worried About Charity fundraiser in the Last Year: Never true   [place] in the Last Year: Never true  Transportation Needs: No Transportation Needs   Lack of Transportation (Medical): No   Lack of Transportation (Non-Medical): No  Physical Activity: Insufficiently Active   Days of Exercise per Week: 2 days   Minutes of Exercise per Session: 60 min  Stress: Stress Concern Present   Feeling of Stress : To some extent  Social Connections: Socially Integrated   Frequency of Communication with Friends and Family: More than three times a week  Frequency of Social Gatherings with Friends and Family: More than three times a week   Attends Religious Services: More than 4 times per year   Active Member of Clubs or Organizations: Yes   Attends Archivist Meetings: 1 to 4 times per year    Marital Status: Married    Her Allergies Are:  Allergies  Allergen Reactions   Amlodipine Swelling   Diclofenac Hypertension   Lanolin    Tape Other (See Comments)    Redness (Bandaids also)  :   Her Current Medications Are:  Outpatient Encounter Medications as of 02/09/2021  Medication Sig   ACETAMINOPHEN PO Take 650 mg by mouth 2 (two) times daily as needed (for shoulder and other mild to moderate pain).   aMILoride (MIDAMOR) 5 MG tablet Take 1 tablet (5 mg total) by mouth daily.   B Complex-C (B-COMPLEX WITH VITAMIN C) tablet Take 1 tablet by mouth daily.   CALCIUM CARBONATE-VITAMIN D PO Take 1 tablet by mouth 2 (two) times daily. 600mg  calcium, D3 (1000)IU   carvedilol (COREG) 6.25 MG tablet Take 1 tablet (6.25 mg total) by mouth 2 (two) times daily with a meal.   COVID-19 mRNA vaccine, Moderna, (MODERNA COVID-19 VACCINE) 100 MCG/0.5ML injection Inject into the muscle.   Elderberry 575 MG/5ML SYRP Take by mouth daily.   Fexofenadine HCl (ALLEGRA PO) Take 1 tablet by mouth daily as needed.   hydrochlorothiazide (MICROZIDE) 12.5 MG capsule Take 1 capsule (12.5 mg total) by mouth daily.   ibuprofen (ADVIL) 200 MG tablet Take 400 mg by mouth daily as needed (for pain not relieved by Tylenol / acetaminophen).   pantoprazole (PROTONIX) 40 MG tablet Take 1 tablet (40 mg total) by mouth daily.   Semaglutide,0.25 or 0.5MG /DOS, (OZEMPIC, 0.25 OR 0.5 MG/DOSE,) 2 MG/1.5ML SOPN Inject 0.25 mg into the skin once a week.   simvastatin (ZOCOR) 20 MG tablet Take 1 tablet (20 mg total) by mouth daily.   telmisartan (MICARDIS) 80 MG tablet Take 1 tablet (80 mg total) by mouth daily.   No facility-administered encounter medications on file as of 02/09/2021.  :  Review of Systems:  Out of a complete 14 point review of systems, all are reviewed and negative with the exception of these symptoms as listed below:  Review of Systems  Neurological:        Patient is here for evaluation of left facial  droop. Patient reports she had been biting the inside her mouth for two weeks, saw a dentist, and they identified possible facial droop. Patient has no personal history of stroke or bells palsy. Her father had a brain bleed. Patient denies any associated symptoms. A week ago she noticed some brief numbness in two fingers on the left hand while working on a tile floor.    Objective:  Neurological Exam  Physical Exam Physical Examination:   Vitals:   02/09/21 0726  BP: 139/80  Pulse: 64    General Examination: The patient is a very pleasant 70 y.o. female in no acute distress. She appears well-developed and well-nourished and well groomed.   HEENT: Normocephalic, atraumatic, pupils are equal, round and reactive to light, extraocular tracking is well-preserved, corrective eyeglasses in place, mild bilateral cataracts.  Hearing is grossly intact with tuning fork.  Face is symmetric with the exception of mild left nasolabial fold effacement.  She has no difficulty with raising her eyebrows or closing her eyes, no Bell's phenomenon.  Facial sensation is intact with the exception of slight  decrease in temperature and pinprick sensation in the left lower face right around the nasolabial fold and left lower chin area.  Speech is clear without dysarthria, hypophonia or voice tremor.  Neck is supple with full range of motion, no carotid bruits, airway examination is stable, tongue protrudes centrally and palate elevates symmetrically.  Healed scar, left lower lip and the inside from prior bite injury.   Chest: Clear to auscultation without wheezing, rhonchi or crackles noted.   Heart: S1+S2+0, regular and normal without murmurs, rubs or gallops noted.   Abdomen: Soft, non-tender and non-distended.   Extremities: There is trace pitting edema in both ankles, nonpitting puffiness in both legs.    Skin: Warm and dry without trophic changes noted. There are no varicose veins.   Musculoskeletal: exam  reveals arthritic changes in both hands.     Neurologically: Mental status: The patient is awake, alert and oriented in all 4 spheres. Her immediate and remote memory, attention, language skills and fund of knowledge are appropriate. There is no evidence of aphasia, agnosia, apraxia or anomia. Speech is clear with normal prosody and enunciation. Thought process is linear. Mood is normal and affect is normal. Cranial nerves II - XII are as described above under HEENT exam. In addition: shoulder shrug is normal with equal shoulder height noted. Motor exam: Normal bulk, strength and tone is noted.  Reflexes are 1-2+ throughout, toes are downgoing bilaterally.  She has no drift, no rebound, no hand tremor.  Romberg is negative.   Fine motor skills and coordination: Intact with finger taps, hand movements, rapid alternating patting and foot taps bilaterally.   Cerebellar testing: No dysmetria or intention tremor on finger to nose testing. Heel to shin is unremarkable bilaterally. There is no truncal or gait ataxia. Sensory exam: intact to light touch temperature and vibration in the upper and lower extremities. Gait, station and balance: She stands easily. No veering to one side is noted. No leaning to one side is noted. Posture is age-appropriate and stance is narrow based. Gait shows normal stride length and normal pace. No problems turning are noted. Tandem walk is somewhat challenging for her but doable, better after the first few steps.                Assessment and Plan:  In summary, Sincere W Bush is a very pleasant 70 year old female with an underlying medical history of hypertension, hyperlipidemia, heart murmur, history of cluster headaches, reflux disease, breast cancer, status post radiation therapy and surgery, on tamoxifen, asthma, history of rosacea, history of venous insufficiency and pneumonia, hemochromatosis, and obesity, who presents for evaluation of her left facial weakness of  approximately 6 weeks duration.  Exam is not telltale for Bell's palsy.  It is not impossible that she had a lacunar stroke.  Perhaps the thalamic lacune is related.  She has mild decrease in sensation, no other weakness or numbness is noted, exam is otherwise benign.  We talked about stroke risk factors and secondary prevention at length today.  She is working on lifestyle modification, she is working on weight loss in particular and is advised to start a baby aspirin at this time.  I would like to repeat her brain MRI with contrast given her remote history of breast cancer.  In addition, I would like for her to have a carotid Doppler ultrasound and echocardiogram for completion.  We will call her with her test results and plan to see her in clinic in about 3  months, she should be established on a new AutoPap machine by then as well.  We will need a compliance appointment at the time.  She continues to use her CPAP machine.  She has plans to travel to [place], she will leave this week.  If possible, we will try to get some of the testing done before she leaves.  She is reminded to stay well-hydrated and physically active, especially on the plane, she is advised to avoid any dehydration and try to use compression socks.  She has been compliant with her CPAP and is commended for it.  She is agreeable to starting a baby aspirin.  Overall, the rest of her exam is very reassuring.  I answered all her questions today and she was in agreement with the above plan. Thank you very much for allowing me to participate in the care of this nice patient. If I can be of any further assistance to you please do not hesitate to call me at [phone].   Sincerely,     Star Age, MD, PhD  I spent 40 minutes in total face-to-face time and in reviewing records during pre-charting, more than 50% of which was spent in counseling and coordination of care, reviewing test results, reviewing medications and treatment regimen and/or  in discussing or reviewing the diagnosis of stroke, facial weakness, OSA, the prognosis and treatment options. Pertinent laboratory and imaging test results that were available during this visit with the patient were reviewed by me and considered in my medical decision making (see chart for details).

## 2021-02-10 ENCOUNTER — Telehealth: Payer: Self-pay

## 2021-02-10 ENCOUNTER — Ambulatory Visit (INDEPENDENT_AMBULATORY_CARE_PROVIDER_SITE_OTHER): Payer: Medicare HMO | Admitting: Family Medicine

## 2021-02-10 ENCOUNTER — Encounter (INDEPENDENT_AMBULATORY_CARE_PROVIDER_SITE_OTHER): Payer: Self-pay | Admitting: Family Medicine

## 2021-02-10 ENCOUNTER — Other Ambulatory Visit: Payer: Self-pay

## 2021-02-10 VITALS — BP 132/80 | HR 60 | Temp 98.1°F | Ht 59.0 in | Wt 168.0 lb

## 2021-02-10 DIAGNOSIS — E8881 Metabolic syndrome: Secondary | ICD-10-CM

## 2021-02-10 DIAGNOSIS — Z6839 Body mass index (BMI) 39.0-39.9, adult: Secondary | ICD-10-CM | POA: Diagnosis not present

## 2021-02-10 DIAGNOSIS — R7301 Impaired fasting glucose: Secondary | ICD-10-CM

## 2021-02-10 DIAGNOSIS — I69392 Facial weakness following cerebral infarction: Secondary | ICD-10-CM | POA: Diagnosis not present

## 2021-02-10 DIAGNOSIS — Z9989 Dependence on other enabling machines and devices: Secondary | ICD-10-CM | POA: Diagnosis not present

## 2021-02-10 DIAGNOSIS — G4733 Obstructive sleep apnea (adult) (pediatric): Secondary | ICD-10-CM | POA: Diagnosis not present

## 2021-02-10 NOTE — Telephone Encounter (Signed)
-----   Message from Johny Drilling, RN sent at 02/10/2021 12:40 PM EDT -----  ----- Message ----- From: Eliezer Bottom, NP Sent: 02/10/2021  12:01 PM EDT To: Onc Nurse Hp  Iron studies are still stable. No phlebotomy needed at this time. Thank you!!!   ----- Message ----- From: Buel Ream, [facility] [place] Sent: 02/04/2021  10:03 AM EDT To: Eliezer Bottom, NP

## 2021-02-10 NOTE — Progress Notes (Signed)
Chief Complaint:   OBESITY Laura Bush is here to discuss her progress with her obesity treatment plan along with follow-up of her obesity related diagnoses. See Medical Weight Management Flowsheet for complete bioelectrical impedance results.  Today's visit was #: 53 Starting weight: 196 lbs Starting date: 10/17/2019 Today's weight: 168 lb Today's date: 02/10/2021 Weight change since last visit: 5 lbs Total lbs lost to date: 28 lbs Body mass index is 33.93 kg/m.  Total weight loss percentage to date: -14.29%  Interim History:  Laura Bush has a recent stroke diagnosis.  Reviewed notes/images.  She is starting aspirin 81 mg daily.  She will be flying to [place] for vacation soon.  She says she feels Ozempic is helping.  No side effects.  Nutrition Plan: the [place] for 90% of the time. Activity:  Walking/yoga for 60 minutes 3 times per week. Anti-obesity medications: Ozempic 0.25 mg subcutaneously weekly. Reported side effects: None.  Assessment/Plan:   1. Metabolic syndrome Starting goal: Lose 7-10% of starting weight. She will continue to focus on protein-rich, low simple carbohydrate foods. We reviewed the importance of hydration, regular exercise for stress reduction, and restorative sleep.  We will continue to check lab work every 3 months, with 10% weight loss, or should any other concerns arise.  2. Facial weakness due to recent stroke Reviewed recent notes and images.  Laura Bush has started taking aspirin 81 mg daily. We will continue to monitor symptoms as they relate to her weight loss journey.  3. OSA on CPAP Laura Bush continues to use her CPAP machine while waiting for new supplies.  OSA is a cause of systemic hypertension and is associated with an increased incidence of stroke, heart failure, atrial fibrillation, and coronary heart disease. Severe OSA increases all-cause mortality and cardiovascular mortality.   Goal: Treatment of OSA via CPAP compliance and weight  loss. Plasma ghrelin levels (appetite or "hunger hormone") are significantly higher in OSA patients than in BMI-matched controls, but decrease to levels similar to those of obese patients without OSA after CPAP treatment.  Weight loss improves OSA by several mechanisms, including reduction in fatty tissue in the throat (i.e. parapharyngeal fat) and the tongue. Loss of abdominal fat increases mediastinal traction on the upper airway making it less likely to collapse during sleep. Studies have also shown that compliance with CPAP treatment improves leptin (hunger inhibitory hormone) imbalance.  4. Impaired fasting glucose Laura Bush is taking Ozempic 0.25 mg subcutaneously weekly with no side effects.  She feels it is helping her.  Plan:  Increase Ozempic to 0.5 mg subcutaneously weekly.  - Increase and refill semaglutide,0.25 or 0.5MG /DOS, (OZEMPIC, 0.25 OR 0.5 MG/DOSE,) 2 MG/1.5ML SOPN; Inject 0.5 mg into the skin once a week.  Dispense: 1.5 mL; Refill: 0  5. Obesity, current BMI 34  Course: Laura Bush is currently in the action stage of change. As such, her goal is to continue with weight loss efforts.   Nutrition goals: She has agreed to the [employer].   Exercise goals:  As is.  Behavioral modification strategies: increasing lean protein intake, decreasing simple carbohydrates, increasing vegetables, and increasing water intake.  Laura Bush has agreed to follow-up with our clinic in 4 weeks. She was informed of the importance of frequent follow-up visits to maximize her success with intensive lifestyle modifications for her multiple health conditions.   Objective:   Blood pressure 132/80, pulse 60, temperature 98.1 F (36.7 C), temperature source Oral, height 4\' 11"  (1.499 m), weight 168 lb (76.2 kg), SpO2  96 %. Body mass index is 33.93 kg/m.  General: Cooperative, alert, well developed, in no acute distress. HEENT: Conjunctivae and lids unremarkable. Cardiovascular: Regular rhythm.   Lungs: Normal work of breathing. Neurologic: No focal deficits.   Lab Results  Component Value Date   CREATININE 0.84 02/04/2021   BUN 23 02/04/2021   NA 139 02/04/2021   K 4.5 02/04/2021   CL 103 02/04/2021   CO2 30 02/04/2021   Lab Results  Component Value Date   ALT 24 02/04/2021   AST 20 02/04/2021   ALKPHOS 52 02/04/2021   BILITOT 0.6 02/04/2021   Lab Results  Component Value Date   HGBA1C 5.2 04/06/2020   HGBA1C 5.6 04/03/2019   HGBA1C 5.6 03/28/2018   Lab Results  Component Value Date   INSULIN 12.6 10/17/2019   Lab Results  Component Value Date   TSH 0.88 10/05/2020   Lab Results  Component Value Date   CHOL 156 10/05/2020   HDL 54.30 10/05/2020   LDLCALC 66 10/05/2020   TRIG 182.0 (H) 10/05/2020   CHOLHDL 3 10/05/2020   Lab Results  Component Value Date   VD25OH 44.11 10/05/2020   VD25OH 38.52 04/03/2019   VD25OH 43.05 03/22/2017   Lab Results  Component Value Date   WBC 3.7 (L) 02/04/2021   HGB 12.9 02/04/2021   HCT 38.3 02/04/2021   MCV 96.7 02/04/2021   PLT 169 02/04/2021   Lab Results  Component Value Date   IRON 85 02/04/2021   TIBC 364 02/04/2021   FERRITIN 67 02/04/2021   Obesity Behavioral Intervention:   Approximately 15 minutes were spent on the discussion below.  ASK: We discussed the diagnosis of obesity with Laura Bush today and Laura Bush agreed to give [place] permission to discuss obesity behavioral modification therapy today.  ASSESS: Laura Bush has the diagnosis of obesity and her BMI today is 34.0. Laura Bush is in the action stage of change.   ADVISE: Laura Bush was educated on the multiple health risks of obesity as well as the benefit of weight loss to improve her health. She was advised of the need for long term treatment and the importance of lifestyle modifications to improve her current health and to decrease her risk of future health problems.  AGREE: Multiple dietary modification options and treatment options were discussed and  Laura Bush agreed to follow the recommendations documented in the above note.  ARRANGE: Laura Bush was educated on the importance of frequent visits to treat obesity as outlined per CMS and USPSTF guidelines and agreed to schedule her next follow up appointment today.  Attestation Statements:   Reviewed by clinician on day of visit: allergies, medications, problem list, medical history, surgical history, family history, social history, and previous encounter notes.  I, Water quality scientist, CMA, am acting as transcriptionist for Briscoe Deutscher, DO  I have reviewed the above documentation for accuracy and completeness, and I agree with the above. Briscoe Deutscher, DO

## 2021-02-10 NOTE — Telephone Encounter (Signed)
Called and informed patient of lab results, patient verbalized understanding and denies any questions or concerns at this time.   

## 2021-02-11 MED ORDER — OZEMPIC (0.25 OR 0.5 MG/DOSE) 2 MG/1.5ML ~~LOC~~ SOPN: 0.5000 mg | PEN_INJECTOR | SUBCUTANEOUS | 0 refills | Status: AC

## 2021-02-20 ENCOUNTER — Other Ambulatory Visit: Payer: Medicare HMO

## 2021-02-22 ENCOUNTER — Encounter: Payer: Self-pay | Admitting: Neurology

## 2021-02-22 NOTE — Telephone Encounter (Signed)
Per Butch Penny with the vascular dept at [facility], the Carotid order needs to be changed to vascular

## 2021-02-23 ENCOUNTER — Other Ambulatory Visit: Payer: Medicare HMO

## 2021-02-24 ENCOUNTER — Encounter: Payer: Self-pay | Admitting: Family Medicine

## 2021-02-24 ENCOUNTER — Other Ambulatory Visit (HOSPITAL_BASED_OUTPATIENT_CLINIC_OR_DEPARTMENT_OTHER): Payer: Self-pay

## 2021-02-24 ENCOUNTER — Telehealth: Payer: Self-pay | Admitting: Family Medicine

## 2021-02-24 ENCOUNTER — Other Ambulatory Visit (HOSPITAL_COMMUNITY): Payer: Medicare HMO

## 2021-02-24 DIAGNOSIS — U071 COVID-19: Secondary | ICD-10-CM

## 2021-02-24 MED ORDER — NIRMATRELVIR/RITONAVIR (PAXLOVID)TABLET
3.0000 | ORAL_TABLET | Freq: Two times a day (BID) | ORAL | 0 refills | Status: AC
  Filled 2021-02-24: qty 30, 5d supply, fill #0

## 2021-02-24 NOTE — Telephone Encounter (Signed)
Patient needs appointment prior to sending in medication.

## 2021-02-24 NOTE — Telephone Encounter (Signed)
Patient symptoms begin on 02/23/21, test pos on 07/20, congestion, fatigue, cough would like medication to be sent to  [place] Phone:  [phone]  Fax:  [phone]

## 2021-03-03 ENCOUNTER — Telehealth: Payer: Self-pay

## 2021-03-03 ENCOUNTER — Ambulatory Visit (HOSPITAL_COMMUNITY)
Admission: RE | Admit: 2021-03-03 | Discharge: 2021-03-03 | Disposition: A | Discharge status: Home / Self Care | Payer: Medicare HMO | Source: Ambulatory Visit | Attending: Neurology | Admitting: Neurology

## 2021-03-03 ENCOUNTER — Ambulatory Visit (HOSPITAL_BASED_OUTPATIENT_CLINIC_OR_DEPARTMENT_OTHER)
Admission: RE | Admit: 2021-03-03 | Discharge: 2021-03-03 | Disposition: A | Discharge status: Home / Self Care | Payer: Medicare HMO | Source: Ambulatory Visit | Attending: Neurology | Admitting: Neurology

## 2021-03-03 ENCOUNTER — Other Ambulatory Visit: Payer: Self-pay

## 2021-03-03 DIAGNOSIS — Z9989 Dependence on other enabling machines and devices: Secondary | ICD-10-CM

## 2021-03-03 DIAGNOSIS — I119 Hypertensive heart disease without heart failure: Secondary | ICD-10-CM | POA: Insufficient documentation

## 2021-03-03 DIAGNOSIS — G4733 Obstructive sleep apnea (adult) (pediatric): Secondary | ICD-10-CM

## 2021-03-03 DIAGNOSIS — I6381 Other cerebral infarction due to occlusion or stenosis of small artery: Secondary | ICD-10-CM

## 2021-03-03 DIAGNOSIS — Z85841 Personal history of malignant neoplasm of brain: Secondary | ICD-10-CM

## 2021-03-03 DIAGNOSIS — I69392 Facial weakness following cerebral infarction: Secondary | ICD-10-CM

## 2021-03-03 DIAGNOSIS — E785 Hyperlipidemia, unspecified: Secondary | ICD-10-CM | POA: Insufficient documentation

## 2021-03-03 DIAGNOSIS — I6389 Other cerebral infarction: Secondary | ICD-10-CM

## 2021-03-03 DIAGNOSIS — Z820 Family history of epilepsy and other diseases of the nervous system: Secondary | ICD-10-CM | POA: Diagnosis not present

## 2021-03-03 DIAGNOSIS — Z823 Family history of stroke: Secondary | ICD-10-CM

## 2021-03-03 LAB — ECHOCARDIOGRAM COMPLETE
Area-P 1/2: 3.33 cm2
S' Lateral: 2.6 cm

## 2021-03-03 NOTE — Telephone Encounter (Signed)
I called patient.  I discussed her carotid Doppler study and echocardiogram results.  Patient verbalized understanding of results.  Patient had no questions or concerns at this time.

## 2021-03-03 NOTE — Progress Notes (Signed)
  Echocardiogram 2D Echocardiogram has been performed.  Randa Lynn Anis Cinelli 03/03/2021, 10:33 AM

## 2021-03-03 NOTE — Progress Notes (Signed)
Carotid artery duplex has been completed. Preliminary results can be found in CV Proc through chart review.   03/03/21 10:30 AM Laura Bush RVT

## 2021-03-03 NOTE — Telephone Encounter (Signed)
-----   Message from Star Age, MD sent at 03/03/2021  4:02 PM EDT ----- Please advise patient that the recent carotid Doppler study which is the ultrasound of the main neck arteries, was negative for any significant stenosis, that is tightness or hardening of the arteries. No further action is required at this time of this test.  Her Echocardiogram results were reviewed; overall good pump function and no sinister findings like a blood clot or heart valve abnormalities, test was all in all reassuring, no further action required, let her know of this as well.   Star Age, MD, PhD [facility])   Star Age, MD, PhD [facility])

## 2021-03-05 ENCOUNTER — Inpatient Hospital Stay: Payer: Medicare HMO | Admitting: Family

## 2021-03-05 ENCOUNTER — Inpatient Hospital Stay: Payer: Medicare HMO | Attending: Family

## 2021-03-05 ENCOUNTER — Encounter: Payer: Self-pay | Admitting: Family

## 2021-03-05 ENCOUNTER — Telehealth: Payer: Self-pay | Admitting: *Deleted

## 2021-03-05 ENCOUNTER — Other Ambulatory Visit: Payer: Self-pay

## 2021-03-05 LAB — CBC WITH DIFFERENTIAL (CANCER CENTER ONLY)
Abs Immature Granulocytes: 0.02 10*3/uL (ref 0.00–0.07)
Basophils Absolute: 0 10*3/uL (ref 0.0–0.1)
Basophils Relative: 1 %
Eosinophils Absolute: 0.3 10*3/uL (ref 0.0–0.5)
Eosinophils Relative: 5 %
HCT: 39.9 % (ref 36.0–46.0)
Hemoglobin: 13.6 g/dL (ref 12.0–15.0)
Immature Granulocytes: 0 %
Lymphocytes Relative: 27 %
Lymphs Abs: 1.6 10*3/uL (ref 0.7–4.0)
MCH: 32.9 pg (ref 26.0–34.0)
MCHC: 34.1 g/dL (ref 30.0–36.0)
MCV: 96.4 fL (ref 80.0–100.0)
Monocytes Absolute: 0.7 10*3/uL (ref 0.1–1.0)
Monocytes Relative: 12 %
Neutro Abs: 3.2 10*3/uL (ref 1.7–7.7)
Neutrophils Relative %: 55 %
Platelet Count: 205 10*3/uL (ref 150–400)
RBC: 4.14 MIL/uL (ref 3.87–5.11)
RDW: 11.9 % (ref 11.5–15.5)
WBC Count: 5.8 10*3/uL (ref 4.0–10.5)
nRBC: 0 % (ref 0.0–0.2)

## 2021-03-05 LAB — CMP (CANCER CENTER ONLY)
ALT: 25 U/L (ref 0–44)
AST: 18 U/L (ref 15–41)
Albumin: 4.3 g/dL (ref 3.5–5.0)
Alkaline Phosphatase: 50 U/L (ref 38–126)
Anion gap: 6 (ref 5–15)
BUN: 24 mg/dL — ABNORMAL HIGH (ref 8–23)
CO2: 31 mmol/L (ref 22–32)
Calcium: 10.2 mg/dL (ref 8.9–10.3)
Chloride: 102 mmol/L (ref 98–111)
Creatinine: 0.87 mg/dL (ref 0.44–1.00)
GFR, Estimated: >60 mL/min (ref >60)
Glucose, Bld: 92 mg/dL (ref 70–99)
Potassium: 4.4 mmol/L (ref 3.5–5.1)
Sodium: 139 mmol/L (ref 135–145)
Total Bilirubin: 0.4 mg/dL (ref 0.3–1.2)
Total Protein: 7.1 g/dL (ref 6.5–8.1)

## 2021-03-05 NOTE — Progress Notes (Signed)
Hematology and Oncology Follow Up Visit  Laura Bush Laura Bush [id number] [birth year] 70 y.o. 03/05/2021   Principle Diagnosis:  Hemochromatosis, heterozygous for the H63D mutation  Current Therapy:   Phlebotomy to maintain iron saturation < 50% and ferritin < 100   Interim History:  [name] is here today for follow-up. She had Covid almost 2 weeks ago. Thankfully she states her symptoms were light like a head cold.  She had facial weakness and was having issues with biting the inside of her cheek. Her neurologist is unsure if this is due to a TIA. She is scheduled for a brain MRI with contrast next week on Monday.  She notes fatigue since having Covid.  No fever, chills, n/v, cough, rash, dizziness, SOB, chest pain, palpitations, abdominal pain or changes in bowel or bladder habits.  No blood loss. No bruising or petechiae.  No swelling, tenderness, numbness or tingling in her extremities at this time.  She has occasion joint aches off and on and is unsure if this is due to arthritis.  No falls or syncope.  She has maintained a good appetite and is staying well hydrated. Her weight is stable at 172 lbs.   ECOG Performance Status: 1 - Symptomatic but completely ambulatory  Medications:  Allergies as of 03/05/2021       Reactions   Amlodipine Swelling   Diclofenac Hypertension   Lanolin    Tape Other (See Comments)   Redness (Bandaids also)        Medication List        Accurate as of March 05, 2021 11:39 AM. If you have any questions, ask your nurse or doctor.          ACETAMINOPHEN PO Take 650 mg by mouth 2 (two) times daily as needed (for shoulder and other mild to moderate pain).   ALLEGRA PO Take 1 tablet by mouth daily as needed.   aMILoride 5 MG tablet Commonly known as: MIDAMOR Take 1 tablet (5 mg total) by mouth daily.   B-complex with vitamin C tablet Take 1 tablet by mouth daily.   CALCIUM CARBONATE-VITAMIN D PO Take 1 tablet by mouth 2 (two)  times daily. 600mg  calcium, D3 (1000)IU   carvedilol 6.25 MG tablet Commonly known as: COREG Take 1 tablet (6.25 mg total) by mouth 2 (two) times daily with a meal.   Elderberry 575 MG/5ML Syrp Take by mouth daily.   hydrochlorothiazide 12.5 MG capsule Commonly known as: MICROZIDE Take 1 capsule (12.5 mg total) by mouth daily.   ibuprofen 200 MG tablet Commonly known as: ADVIL Take 400 mg by mouth daily as needed (for pain not relieved by Tylenol / acetaminophen).   Moderna COVID-19 Vaccine 100 MCG/0.5ML injection Generic drug: COVID-19 mRNA vaccine (Moderna) Inject into the muscle.   Ozempic (0.25 or 0.5 MG/DOSE) 2 MG/1.5ML Sopn Generic drug: Semaglutide(0.25 or 0.5MG /DOS) Inject 0.5 mg into the skin once a week.   pantoprazole 40 MG tablet Commonly known as: PROTONIX Take 1 tablet (40 mg total) by mouth daily.   simvastatin 20 MG tablet Commonly known as: ZOCOR Take 1 tablet (20 mg total) by mouth daily.   telmisartan 80 MG tablet Commonly known as: MICARDIS Take 1 tablet (80 mg total) by mouth daily.        Allergies:  Allergies  Allergen Reactions   Amlodipine Swelling   Diclofenac Hypertension   Lanolin    Tape Other (See Comments)    Redness (Bandaids also)    Past Medical  History, Surgical history, Social history, and Family History were reviewed and updated.  Review of Systems: All other 10 point review of systems is negative.   Physical Exam:  vitals were not taken for this visit.   Wt Readings from Last 3 Encounters:  02/10/21 168 lb (76.2 kg)  02/09/21 174 lb (78.9 kg)  01/21/21 173 lb (78.5 kg)    Ocular: Sclerae unicteric, pupils equal, round and reactive to light Ear-nose-throat: Oropharynx clear, dentition fair Lymphatic: No cervical or supraclavicular adenopathy Lungs no rales or rhonchi, good excursion bilaterally Heart regular rate and rhythm, no murmur appreciated Abd soft, nontender, positive bowel sounds MSK no focal spinal  tenderness, no joint edema Neuro: non-focal, well-oriented, appropriate affect Breasts: Deferred   Lab Results  Component Value Date   WBC 5.8 03/05/2021   HGB 13.6 03/05/2021   HCT 39.9 03/05/2021   MCV 96.4 03/05/2021   PLT 205 03/05/2021   Lab Results  Component Value Date   FERRITIN 67 02/04/2021   IRON 85 02/04/2021   TIBC 364 02/04/2021   UIBC 279 02/04/2021   IRONPCTSAT 23 02/04/2021   Lab Results  Component Value Date   RETICCTPCT 1.5 10/17/2019   RBC 4.14 03/05/2021   No results found for: KPAFRELGTCHN, LAMBDASER, KAPLAMBRATIO No results found for: Kandis Cocking, IGMSERUM No results found for: Odetta Pink, SPEI   Chemistry      Component Value Date/Time   NA 139 02/04/2021 0955   NA 142 05/29/2017 1002   K 4.5 02/04/2021 0955   K 3.9 05/29/2017 1002   CL 103 02/04/2021 0955   CO2 30 02/04/2021 0955   CO2 28 05/29/2017 1002   BUN 23 02/04/2021 0955   BUN 13.5 05/29/2017 1002   CREATININE 0.84 02/04/2021 0955   CREATININE 0.81 04/06/2020 1059   CREATININE 0.8 05/29/2017 1002      Component Value Date/Time   CALCIUM 10.2 02/04/2021 0955   CALCIUM 9.4 05/29/2017 1002   ALKPHOS 52 02/04/2021 0955   ALKPHOS 39 (L) 05/29/2017 1002   AST 20 02/04/2021 0955   AST 18 05/29/2017 1002   ALT 24 02/04/2021 0955   ALT 19 05/29/2017 1002   BILITOT 0.6 02/04/2021 0955   BILITOT 0.48 05/29/2017 1002       Impression and Plan: [name] is a very pleasant 71 yo caucasian female with recent diagnosis of hemochromatosis, heterozygous for the H63D mutation. Iron studies pending. We will set her up to donate with [facility] if needed.  Lab in 6 weeks and follow-up with lab in 3 months.  She can contact our office with any questions or concerns.   Laverna Peace, NP 7/29/202211:39 AM

## 2021-03-05 NOTE — Telephone Encounter (Signed)
Per 03/05/21 los - gave patient upcoming appointments - confirmed

## 2021-03-06 ENCOUNTER — Other Ambulatory Visit (INDEPENDENT_AMBULATORY_CARE_PROVIDER_SITE_OTHER): Payer: Self-pay | Admitting: Family Medicine

## 2021-03-06 ENCOUNTER — Encounter: Payer: Self-pay | Admitting: Family Medicine

## 2021-03-06 DIAGNOSIS — R7303 Prediabetes: Secondary | ICD-10-CM

## 2021-03-06 DIAGNOSIS — I10 Essential (primary) hypertension: Secondary | ICD-10-CM

## 2021-03-07 MED ORDER — TELMISARTAN 80 MG PO TABS: 80.0000 mg | ORAL_TABLET | Freq: Every day | ORAL | 1 refills | Status: DC

## 2021-03-08 ENCOUNTER — Encounter: Payer: Self-pay | Admitting: *Deleted

## 2021-03-08 ENCOUNTER — Other Ambulatory Visit: Payer: Medicare HMO

## 2021-03-08 LAB — FERRITIN: Ferritin: 90 ng/mL (ref 11–307)

## 2021-03-08 LAB — IRON AND TIBC
Iron: 101 ug/dL (ref 41–142)
Saturation Ratios: 28 % (ref 21–57)
TIBC: 364 ug/dL (ref 236–444)
UIBC: 262 ug/dL (ref 120–384)

## 2021-03-08 NOTE — Telephone Encounter (Signed)
Last OV with Dr Wallace 

## 2021-03-12 ENCOUNTER — Other Ambulatory Visit (INDEPENDENT_AMBULATORY_CARE_PROVIDER_SITE_OTHER): Payer: Self-pay | Admitting: Family Medicine

## 2021-03-12 DIAGNOSIS — R7303 Prediabetes: Secondary | ICD-10-CM

## 2021-03-15 NOTE — Telephone Encounter (Signed)
Last OV with Dr Wallace 

## 2021-03-16 ENCOUNTER — Encounter (INDEPENDENT_AMBULATORY_CARE_PROVIDER_SITE_OTHER): Payer: Self-pay

## 2021-03-16 ENCOUNTER — Other Ambulatory Visit (INDEPENDENT_AMBULATORY_CARE_PROVIDER_SITE_OTHER): Payer: Self-pay | Admitting: Family Medicine

## 2021-03-16 DIAGNOSIS — R7301 Impaired fasting glucose: Secondary | ICD-10-CM

## 2021-03-16 NOTE — Telephone Encounter (Signed)
Msg sent to pt 

## 2021-03-16 NOTE — Telephone Encounter (Signed)
Last OV with Dr Wallace 

## 2021-03-22 ENCOUNTER — Ambulatory Visit (INDEPENDENT_AMBULATORY_CARE_PROVIDER_SITE_OTHER): Payer: Medicare HMO | Admitting: Family Medicine

## 2021-03-22 ENCOUNTER — Ambulatory Visit
Admission: RE | Admit: 2021-03-22 | Discharge: 2021-03-22 | Disposition: A | Discharge status: Home / Self Care | Payer: Medicare HMO | Source: Ambulatory Visit | Attending: Neurology | Admitting: Neurology

## 2021-03-22 ENCOUNTER — Encounter (INDEPENDENT_AMBULATORY_CARE_PROVIDER_SITE_OTHER): Payer: Self-pay | Admitting: Family Medicine

## 2021-03-22 ENCOUNTER — Other Ambulatory Visit: Payer: Self-pay

## 2021-03-22 VITALS — BP 144/80 | HR 56 | Temp 98.1°F | Ht 59.0 in | Wt 169.0 lb

## 2021-03-22 DIAGNOSIS — I69392 Facial weakness following cerebral infarction: Secondary | ICD-10-CM | POA: Diagnosis not present

## 2021-03-22 DIAGNOSIS — Z820 Family history of epilepsy and other diseases of the nervous system: Secondary | ICD-10-CM

## 2021-03-22 DIAGNOSIS — G4733 Obstructive sleep apnea (adult) (pediatric): Secondary | ICD-10-CM

## 2021-03-22 DIAGNOSIS — Z6839 Body mass index (BMI) 39.0-39.9, adult: Secondary | ICD-10-CM | POA: Diagnosis not present

## 2021-03-22 DIAGNOSIS — Z9989 Dependence on other enabling machines and devices: Secondary | ICD-10-CM

## 2021-03-22 DIAGNOSIS — I6381 Other cerebral infarction due to occlusion or stenosis of small artery: Secondary | ICD-10-CM | POA: Diagnosis not present

## 2021-03-22 DIAGNOSIS — Z85841 Personal history of malignant neoplasm of brain: Secondary | ICD-10-CM

## 2021-03-22 DIAGNOSIS — R7301 Impaired fasting glucose: Secondary | ICD-10-CM

## 2021-03-22 MED ORDER — GADOBENATE DIMEGLUMINE 529 MG/ML IV SOLN
15.0000 mL | Freq: Once | INTRAVENOUS | Status: AC | PRN
  Administered 2021-03-22: 15 mL via INTRAVENOUS

## 2021-03-22 MED ORDER — OZEMPIC (1 MG/DOSE) 4 MG/3ML ~~LOC~~ SOPN: 1.0000 mg | PEN_INJECTOR | SUBCUTANEOUS | 0 refills | Status: DC

## 2021-03-23 ENCOUNTER — Telehealth: Payer: Self-pay | Admitting: *Deleted

## 2021-03-23 NOTE — Telephone Encounter (Signed)
-----   Message from Star Age, MD sent at 03/22/2021  6:44 PM EDT ----- Please call patient and advise her that her recent brain MRI with and without contrast did not show any acute findings and showed a normal contrast uptake which is reassuring.  Findings were felt to be stable from her brain MRI from June 2022.  She can follow-up with me as scheduled.

## 2021-03-23 NOTE — Telephone Encounter (Signed)
Spoke with patient and discussed MRI brain results as noted below by Dr Rexene Alberts.  Patient verbalized understanding and questions were answered.  She will follow-up in October as scheduled but is still waiting on her CPAP machine.  She will call us when she gets her new machine as we may be able to move the appointment out a little to get 31 to 89 days of data.

## 2021-03-24 NOTE — Progress Notes (Signed)
Chief Complaint:   OBESITY Laura Bush is here to discuss her progress with her obesity treatment plan along with follow-up of her obesity related diagnoses.   Today's visit was #: 25 Starting weight: 196 lbs Starting date: 10/17/2019 Today's weight: 169 lbs Today's date: 03/22/2021 Weight change since last visit: +1 lb Total lbs lost to date: 27 lbs Body mass index is 34.13 kg/m.  Total weight loss percentage to date: -13.79%  Current Meal Plan: the [place] for 90% of the time.  Current Exercise Plan: None at this time  Current Anti-Obesity Medications: Ozempic 0.5 mg subcutaneously weekly. Side effects: None.  Interim History: Laura Bush has an MRI with contrast today and has her dental follow up today. Her recent DEXA showes her osteopenia has resolved.  Assessment/Plan:   1. Impaired fasting glucose After discussion, patient would like to increase her Ozempic medicaiton. Expectations, risks, and potential side effects reviewed.  Laura Bush will start Ozempic 1mg  subcutaneously weekly, as per below.  - Start Semaglutide, 1 MG/DOSE, (OZEMPIC, 1 MG/DOSE,) 4 MG/3ML SOPN; Inject 1 mg into the skin once a week.  Dispense: 3 mL; Refill: 0  2. Obesity, current BMI 34.3 Course: Laura Bush is currently in the action stage of change. As such, her goal is to continue with weight loss efforts.   Nutrition goals: She has agreed to the Laura Bush.   Exercise goals: As tolerated. All adults should avoid inactivity. Some physical activity is better than none, and adults who participate in any amount of physical activity gain some health benefits.  Behavioral modification strategies: increasing lean protein intake, decreasing simple carbohydrates, increasing vegetables, and increasing water intake.  Laura Bush has agreed to follow-up with our clinic in 4 weeks. She was informed of the importance of frequent follow-up visits to maximize her success with intensive  lifestyle modifications for her multiple health conditions.   Objective:   Blood pressure (!) 144/80, pulse (!) 56, temperature 98.1 F (36.7 C), temperature source Oral, height 4\' 11"  (1.499 m), weight 169 lb (76.7 kg), SpO2 100 %. Body mass index is 34.13 kg/m.  General: Cooperative, alert, well developed, in no acute distress. HEENT: Conjunctivae and lids unremarkable. Cardiovascular: Regular rhythm.  Lungs: Normal work of breathing. Neurologic: No focal deficits.   Lab Results  Component Value Date   CREATININE 0.87 03/05/2021   BUN 24 (H) 03/05/2021   NA 139 03/05/2021   K 4.4 03/05/2021   CL 102 03/05/2021   CO2 31 03/05/2021   Lab Results  Component Value Date   ALT 25 03/05/2021   AST 18 03/05/2021   ALKPHOS 50 03/05/2021   BILITOT 0.4 03/05/2021   Lab Results  Component Value Date   HGBA1C 5.2 04/06/2020   HGBA1C 5.6 04/03/2019   HGBA1C 5.6 03/28/2018   Lab Results  Component Value Date   INSULIN 12.6 10/17/2019   Lab Results  Component Value Date   TSH 0.88 10/05/2020   Lab Results  Component Value Date   CHOL 156 10/05/2020   HDL 54.30 10/05/2020   LDLCALC 66 10/05/2020   TRIG 182.0 (H) 10/05/2020   CHOLHDL 3 10/05/2020   Lab Results  Component Value Date   VD25OH 44.11 10/05/2020   VD25OH 38.52 04/03/2019   VD25OH 43.05 03/22/2017   Lab Results  Component Value Date   WBC 5.8 03/05/2021   HGB 13.6 03/05/2021   HCT 39.9 03/05/2021   MCV 96.4 03/05/2021   PLT 205 03/05/2021   Lab  Results  Component Value Date   IRON 101 03/05/2021   TIBC 364 03/05/2021   FERRITIN 90 03/05/2021    Obesity Behavioral Intervention:   Approximately 15 minutes were spent on the discussion below.  ASK: We discussed the diagnosis of obesity with Laura Bush today and Laura Bush agreed to give [place] permission to discuss obesity behavioral modification therapy today.  ASSESS: Laura Bush has the diagnosis of obesity and her BMI today is 34.3. Laura Bush is in the action  stage of change.   ADVISE: Laura Bush was educated on the multiple health risks of obesity as well as the benefit of weight loss to improve her health. She was advised of the need for long term treatment and the importance of lifestyle modifications to improve her current health and to decrease her risk of future health problems.  AGREE: Multiple dietary modification options and treatment options were discussed and Laura Bush agreed to follow the recommendations documented in the above note.  ARRANGE: Laura Bush was educated on the importance of frequent visits to treat obesity as outlined per CMS and USPSTF guidelines and agreed to schedule her next follow up appointment today.  Attestation Statements:   Reviewed by clinician on day of visit: allergies, medications, problem list, medical history, surgical history, family history, social history, and previous encounter notes.  Leodis Binet Friedenbach, CMA, am acting as Location manager for [employer], DO.  I have reviewed the above documentation for accuracy and completeness, and I agree with the above. Briscoe Deutscher, DO

## 2021-03-29 ENCOUNTER — Other Ambulatory Visit: Payer: Self-pay | Admitting: Family Medicine

## 2021-03-29 DIAGNOSIS — E782 Mixed hyperlipidemia: Secondary | ICD-10-CM

## 2021-03-31 ENCOUNTER — Other Ambulatory Visit (INDEPENDENT_AMBULATORY_CARE_PROVIDER_SITE_OTHER): Payer: Self-pay | Admitting: Family Medicine

## 2021-03-31 DIAGNOSIS — R7303 Prediabetes: Secondary | ICD-10-CM

## 2021-03-31 NOTE — Telephone Encounter (Signed)
Pt last seen by Dr. Wallace.  

## 2021-04-02 DIAGNOSIS — E785 Hyperlipidemia, unspecified: Secondary | ICD-10-CM | POA: Diagnosis not present

## 2021-04-02 DIAGNOSIS — I10 Essential (primary) hypertension: Secondary | ICD-10-CM | POA: Diagnosis not present

## 2021-04-02 DIAGNOSIS — M1990 Unspecified osteoarthritis, unspecified site: Secondary | ICD-10-CM | POA: Diagnosis not present

## 2021-04-02 DIAGNOSIS — R600 Localized edema: Secondary | ICD-10-CM | POA: Diagnosis not present

## 2021-04-02 DIAGNOSIS — G4733 Obstructive sleep apnea (adult) (pediatric): Secondary | ICD-10-CM | POA: Diagnosis not present

## 2021-04-02 DIAGNOSIS — K219 Gastro-esophageal reflux disease without esophagitis: Secondary | ICD-10-CM | POA: Diagnosis not present

## 2021-04-02 DIAGNOSIS — G8929 Other chronic pain: Secondary | ICD-10-CM | POA: Diagnosis not present

## 2021-04-02 DIAGNOSIS — E119 Type 2 diabetes mellitus without complications: Secondary | ICD-10-CM | POA: Diagnosis not present

## 2021-04-02 DIAGNOSIS — E669 Obesity, unspecified: Secondary | ICD-10-CM | POA: Diagnosis not present

## 2021-04-02 DIAGNOSIS — J302 Other seasonal allergic rhinitis: Secondary | ICD-10-CM | POA: Diagnosis not present

## 2021-04-02 NOTE — Progress Notes (Signed)
[facility] [address], [place], [place] [address] [phone]  Date:  04/05/2021   Name:  Laura Bush   DOB:  21-Apr-1951   MRN:  [id number]  PCP:  Darreld Mclean, MD    Chief Complaint: Hypertension (6 month follow up/) and History of TIA   History of Present Illness:  Laura Bush is a 70 y.o. very pleasant female patient who presents with the following:  Patient seen today for periodic follow-up visit.  PMH includes hypertension, venous insufficiency, GERD, hypercholesterolemia, and morbid obesity,  breast cancer diagnosed 2016 followed by oncology, hemochromatosis being managed by hematology  Most recent visit with myself was in [place] that time there was concern for possible left facial drooping We obtained an MRI as follows:  IMPRESSION: 1. No acute or focal abnormality to explain the patient's symptoms. 2. Scattered subcortical T2 hyperintensities bilaterally are mildly advanced for age. The finding is nonspecific but can be seen in the setting of chronic microvascular ischemia, a demyelinating process such as multiple sclerosis, vasculitis, complicated migraine headaches, or as the sequelae of a prior infectious or inflammatory process.  She is also being followed by [facility]  Due to the above MRI findings she was also seen by neurology-visit with Dr. Rexene Alberts and MRI with and without as follows: IMPRESSION: This MRI of the brain with and without contrast shows the following: 1.   Scattered T2/FLAIR hyperintense foci in the hemispheres and pons consistent with mild chronic microvascular ischemic change.  A small focus in the right lateral thalamus could represent either an expanded Virchow-Robin space or a chronic lacunar infarction.  None of the foci appear to be acute.  They were all present on the MRI dated 01/23/2021. 2.   Small mastoid effusion on the right.  This  could be incidental or due to eustachian tube dysfunction. 3.   No acute findings.  Normal enhancement pattern.   She also underwent carotid ultrasound which showed no significant stenosis, echocardiogram which is normal She plans to follow-up with neurology again in October  Most recent visit with hematology for her hereditary hemochromatosis was in late July-labs stable, hemoglobin 13.6  She had COVID-19 in July- she notes that her sx were pretty bad Since then she has noted persistent sinus congestion and pnd She also notes "a fog" with difficulty focusing and multi-tasking Sneezing a lot Some cough but more just clearing her throat  She is using allegra  She still feels tired out She has noted a persistent ST since she had covid Colonoscopy up-to-date Mammogram up-to-date Bone density up-to-date We discussed flu shot today - she plans to defer until later in the fall   She did a home [employer] wellness visit last week  She is trying to decrease her stress level She is trying to walk more again for exercise   Her husband recently retired from his work.  This has caused change in her dynamics at home.  She admits to being a bit more stressed, sometimes can feel down.  At this point she does not wish to start medication for depression but she will keep you posted  BP Readings from Last 3 Encounters:  04/05/21 122/80  03/22/21 (!) 144/80  03/05/21 127/76    Patient Active Problem List   Diagnosis Date Noted   Hemochromatosis 01/21/2021   Obesity (BMI 30-39.9) 85/88/5027   Metabolic syndrome 74/07/8785   Mixed  hyperlipidemia 04/21/2020   Polyp of ascending colon 04/21/2020   History of breast cancer, Tamoxifen, nearing five years, Dr. 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/29/2017   Vitamin D deficiency  01/29/2016   Eczema 12/29/2014   Venous insufficiency 10/16/2014   Essential hypertension 10/01/2014   Hypercholesteremia 10/01/2014   GERD (gastroesophageal reflux disease) 10/01/2014   Other bilateral bundle branch block 06/23/2003    Past Medical History:  Diagnosis Date   Asthma    in cold weather   Breast cancer ([place]) 09/18/14   left breast   Breast cancer ([place]) 10/07/14   bx left breast   Breast cancer of upper-outer quadrant of left female breast ([place]) 09/22/2014   Cluster headaches    in her 40's   GERD (gastroesophageal reflux disease)    Heart murmur    as a child (has outgrown)   High cholesterol    Hypertension    Lower extremity edema    Microscopic colitis    Morbid obesity ([place]) 09/30/2019   Obesity (BMI 30-39.9) 11/11/2020   OSA on CPAP    Personal history of radiation therapy    Pneumonia 2000's X 1   PONV (postoperative nausea and vomiting)    Rosacea    S/P radiation therapy 12/30/14-01/27/15   left breast 50Gy total dose   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"   Swallowing difficulty    Venous insufficiency     Past Surgical History:  Procedure Laterality Date   ABDOMINAL HYSTERECTOMY  1999   APPENDECTOMY  ~ 2006   BREAST BIOPSY Left 10/2014   BREAST LUMPECTOMY Left 2016   BREAST REDUCTION SURGERY Bilateral 11/11/2014   Procedure: Bilateral Breast Reduction;  Surgeon: Crissie Reese, MD;  Location: [facility];  Service: Plastics;  Laterality: Bilateral;   CARPAL TUNNEL RELEASE Bilateral    2 surgeries on right, 1 on left   CESAREAN SECTION  1978; [place] Right 2003 X 3   KNEE ARTHROSCOPY Bilateral    meniscus repair   PARTIAL MASTECTOMY WITH NEEDLE LOCALIZATION AND AXILLARY SENTINEL LYMPH NODE BX Left 11/11/2014   Procedure: LEFT BREAST PARTIAL MASTECTOMY WITH NEEDLE LOCALIZATION TIMES TWO AND LEFT AXILLARY SENTINEL LYMPH NODE Biopsy;  Surgeon: Fanny Skates, MD;  Location: [facility];  Service: General;  Laterality: Left;    SQUAMOUS CELL CARCINOMA EXCISION  1980's X 1   "nose"   TONSILLECTOMY  ~ 1957   TUBAL LIGATION  ?1984    Social History   Tobacco Use   Smoking status: Never   Smokeless tobacco: Never  Vaping Use   Vaping Use: Never used  Substance Use Topics   Alcohol use: Yes    Alcohol/week: 5.0 standard drinks    Types: 5 Glasses of wine per week    Comment: now more like 2 drinks/week   Drug use: No    Family History  Problem Relation Age of Onset   Uterine cancer Sister 45   Stroke Father        deceased   Heart failure Father    Hypertension Father    Dementia Mother        Lives in [place]   Hypertension Mother    Hyperlipidemia Mother    Heart attack Maternal Grandmother    Hypertension Maternal Grandfather  Stroke Paternal Grandfather    Colon cancer Neg Hx    Esophageal cancer Neg Hx    Pancreatic cancer Neg Hx    Stomach cancer Neg Hx    Liver disease Neg Hx     Allergies  Allergen Reactions   Diclofenac Hypertension   Amlodipine Swelling   Lanolin    Tape Other (See Comments)    Redness (Bandaids also)    Medication list has been reviewed and updated.  Current Outpatient Medications on File Prior to Visit  Medication Sig Dispense Refill   ACETAMINOPHEN PO Take 650 mg by mouth 2 (two) times daily as needed (for shoulder and other mild to moderate pain).     aMILoride (MIDAMOR) 5 MG tablet Take 1 tablet (5 mg total) by mouth daily. 90 tablet 3   aspirin EC 81 MG tablet Take 81 mg by mouth daily. Swallow whole.     B Complex-C (B-COMPLEX WITH VITAMIN C) tablet Take 1 tablet by mouth daily.     CALCIUM CARBONATE-VITAMIN D PO Take 1 tablet by mouth 2 (two) times daily. 600mg  calcium, D3 (1000)IU     carvedilol (COREG) 6.25 MG tablet Take 1 tablet (6.25 mg total) by mouth 2 (two) times daily with a meal. 180 tablet 3   Elderberry 575 MG/5ML SYRP Take by mouth daily.     Fexofenadine HCl (ALLEGRA PO) Take 1 tablet by mouth daily as needed.      hydrochlorothiazide (MICROZIDE) 12.5 MG capsule TAKE 1 CAPSULE BY MOUTH EVERY DAY 90 capsule 3   ibuprofen (ADVIL) 200 MG tablet Take 400 mg by mouth daily as needed (for pain not relieved by Tylenol / acetaminophen).     pantoprazole (PROTONIX) 40 MG tablet Take 1 tablet (40 mg total) by mouth daily. 90 tablet 3   Semaglutide, 1 MG/DOSE, (OZEMPIC, 1 MG/DOSE,) 4 MG/3ML SOPN Inject 1 mg into the skin once a week. 3 mL 0   simvastatin (ZOCOR) 20 MG tablet TAKE 1 TABLET BY MOUTH EVERY DAY 90 tablet 3   telmisartan (MICARDIS) 80 MG tablet Take 1 tablet (80 mg total) by mouth daily. 30 tablet 1   No current facility-administered medications on file prior to visit.    Review of Systems:  As per HPI- otherwise negative.   Physical Examination: Vitals:   04/05/21 1036  BP: 122/80  Pulse: 70  Resp: 16  Temp: (!) 97.3 F (36.3 C)  SpO2: 98%   Vitals:   04/05/21 1036  Weight: 175 lb (79.4 kg)  Height: 4\' 11"  (1.499 m)   Body mass index is 35.35 kg/m. Ideal Body Weight: Weight in (lb) to have BMI = 25: 123.5  GEN: no acute distress.  Obese, otherwise looks well HEENT: Atraumatic, Normocephalic. Bilateral TM wnl, oropharynx normal.  PEERL,EOMI. erythema of nasal cavity Ears and Nose: No external deformity. CV: RRR, No M/G/R. No JVD. No thrill. No extra heart sounds. PULM: CTA B, no wheezes, crackles, rhonchi. No retractions. No resp. EXTR: No c/c/e PSYCH: Normally interactive. Conversant.    Assessment and Plan: PND (post-nasal drip) - Plan: ipratropium (ATROVENT) 0.06 % nasal spray  Stress at home  Obesity, current BMI 34.3  Essential hypertension  Post-COVID chronic fatigue  Seen today for a follow-up visit.  She recently had recurrent COVID-19, still feeling fatigued.  we discussed redoing lab work, she declines for now.  We will continue to monitor her symptoms-if not improving can look further.  I prescribed Atrovent nasal to use as needed for postnasal  drip  We  discussed her increased stress and some negative mood symptoms.  At this time she feels like she is handling things okay, she will let me know if she needs any help  Blood pressure under good control This visit occurred during the SARS-CoV-2 public health emergency.  Safety protocols were in place, including screening questions prior to the visit, additional usage of staff PPE, and extensive cleaning of exam room while observing appropriate contact time as indicated for disinfecting solutions.   Signed Lamar Blinks, MD

## 2021-04-05 ENCOUNTER — Ambulatory Visit (INDEPENDENT_AMBULATORY_CARE_PROVIDER_SITE_OTHER): Payer: Medicare HMO | Admitting: Family Medicine

## 2021-04-05 ENCOUNTER — Other Ambulatory Visit: Payer: Self-pay

## 2021-04-05 VITALS — BP 122/80 | HR 70 | Temp 97.3°F | Resp 16 | Ht 59.0 in | Wt 175.0 lb

## 2021-04-05 DIAGNOSIS — R69 Illness, unspecified: Secondary | ICD-10-CM | POA: Diagnosis not present

## 2021-04-05 DIAGNOSIS — U099 Post covid-19 condition, unspecified: Secondary | ICD-10-CM

## 2021-04-05 DIAGNOSIS — F439 Reaction to severe stress, unspecified: Secondary | ICD-10-CM | POA: Diagnosis not present

## 2021-04-05 DIAGNOSIS — I10 Essential (primary) hypertension: Secondary | ICD-10-CM | POA: Diagnosis not present

## 2021-04-05 DIAGNOSIS — Z6839 Body mass index (BMI) 39.0-39.9, adult: Secondary | ICD-10-CM

## 2021-04-05 DIAGNOSIS — R5382 Chronic fatigue, unspecified: Secondary | ICD-10-CM

## 2021-04-05 DIAGNOSIS — R0982 Postnasal drip: Secondary | ICD-10-CM | POA: Diagnosis not present

## 2021-04-05 DIAGNOSIS — G9332 Myalgic encephalomyelitis/chronic fatigue syndrome: Secondary | ICD-10-CM

## 2021-04-05 MED ORDER — IPRATROPIUM BROMIDE 0.06 % NA SOLN: 2.0000 | Freq: Four times a day (QID) | NASAL | 12 refills | Status: DC

## 2021-04-05 NOTE — Patient Instructions (Signed)
It was good to see you again today- take care, let me know if you are not doing ok as far as your mood as the year goes on Try the atrovent nasal as needed for post- nasal drainage and congestion . If not helpful we can try a short course of steroids  Please see me in 3-4 months- we can do labs at that time

## 2021-04-06 ENCOUNTER — Encounter (INDEPENDENT_AMBULATORY_CARE_PROVIDER_SITE_OTHER): Payer: Self-pay | Admitting: Family Medicine

## 2021-04-06 DIAGNOSIS — R7301 Impaired fasting glucose: Secondary | ICD-10-CM

## 2021-04-07 ENCOUNTER — Telehealth: Payer: Self-pay

## 2021-04-07 DIAGNOSIS — E782 Mixed hyperlipidemia: Secondary | ICD-10-CM

## 2021-04-07 DIAGNOSIS — I10 Essential (primary) hypertension: Secondary | ICD-10-CM

## 2021-04-07 DIAGNOSIS — R0982 Postnasal drip: Secondary | ICD-10-CM

## 2021-04-07 MED ORDER — SIMVASTATIN 20 MG PO TABS
20.0000 mg | ORAL_TABLET | Freq: Every day | ORAL | 0 refills | Status: DC
Start: 2021-04-07 — End: 2021-07-14

## 2021-04-07 MED ORDER — HYDROCHLOROTHIAZIDE 12.5 MG PO CAPS: ORAL_CAPSULE | ORAL | 0 refills | Status: DC

## 2021-04-07 MED ORDER — CARVEDILOL 6.25 MG PO TABS: 6.2500 mg | ORAL_TABLET | Freq: Two times a day (BID) | ORAL | 0 refills | Status: DC

## 2021-04-07 MED ORDER — IPRATROPIUM BROMIDE 0.06 % NA SOLN
2.0000 | Freq: Four times a day (QID) | NASAL | 4 refills | Status: DC
Start: 2021-04-07 — End: 2023-01-31

## 2021-04-07 MED ORDER — AMILORIDE HCL 5 MG PO TABS: 5.0000 mg | ORAL_TABLET | Freq: Every day | ORAL | 3 refills | Status: DC

## 2021-04-07 MED ORDER — TELMISARTAN 80 MG PO TABS: 80.0000 mg | ORAL_TABLET | Freq: Every day | ORAL | 0 refills | Status: DC

## 2021-04-07 MED ORDER — OZEMPIC (1 MG/DOSE) 4 MG/3ML ~~LOC~~ SOPN: 1.0000 mg | PEN_INJECTOR | SUBCUTANEOUS | 0 refills | Status: DC

## 2021-04-07 NOTE — Telephone Encounter (Signed)
Medications sent to pharmacy

## 2021-04-07 NOTE — Telephone Encounter (Signed)
[employer] called inquiring for patient to have all  maintenance medications refilled to [facility] for 100 days.

## 2021-04-08 ENCOUNTER — Other Ambulatory Visit: Payer: Self-pay

## 2021-04-08 MED ORDER — PANTOPRAZOLE SODIUM 40 MG PO TBEC: 40.0000 mg | DELAYED_RELEASE_TABLET | Freq: Every day | ORAL | 0 refills | Status: DC

## 2021-04-13 ENCOUNTER — Other Ambulatory Visit (HOSPITAL_BASED_OUTPATIENT_CLINIC_OR_DEPARTMENT_OTHER): Payer: Self-pay

## 2021-04-15 ENCOUNTER — Other Ambulatory Visit: Payer: Self-pay | Admitting: Family

## 2021-04-16 ENCOUNTER — Other Ambulatory Visit (INDEPENDENT_AMBULATORY_CARE_PROVIDER_SITE_OTHER): Payer: Self-pay | Admitting: Family Medicine

## 2021-04-16 ENCOUNTER — Other Ambulatory Visit: Payer: Self-pay

## 2021-04-16 ENCOUNTER — Inpatient Hospital Stay: Payer: Medicare HMO | Attending: Family

## 2021-04-16 DIAGNOSIS — Z79899 Other long term (current) drug therapy: Secondary | ICD-10-CM | POA: Diagnosis not present

## 2021-04-16 DIAGNOSIS — R7301 Impaired fasting glucose: Secondary | ICD-10-CM

## 2021-04-16 LAB — CBC WITH DIFFERENTIAL (CANCER CENTER ONLY)
Abs Immature Granulocytes: 0.08 10*3/uL — ABNORMAL HIGH (ref 0.00–0.07)
Basophils Absolute: 0 10*3/uL (ref 0.0–0.1)
Basophils Relative: 1 %
Eosinophils Absolute: 0.3 10*3/uL (ref 0.0–0.5)
Eosinophils Relative: 6 %
HCT: 37.7 % (ref 36.0–46.0)
Hemoglobin: 12.9 g/dL (ref 12.0–15.0)
Immature Granulocytes: 2 %
Lymphocytes Relative: 21 %
Lymphs Abs: 1.1 10*3/uL (ref 0.7–4.0)
MCH: 33.1 pg (ref 26.0–34.0)
MCHC: 34.2 g/dL (ref 30.0–36.0)
MCV: 96.7 fL (ref 80.0–100.0)
Monocytes Absolute: 0.5 10*3/uL (ref 0.1–1.0)
Monocytes Relative: 9 %
Neutro Abs: 3.3 10*3/uL (ref 1.7–7.7)
Neutrophils Relative %: 61 %
Platelet Count: 196 10*3/uL (ref 150–400)
RBC: 3.9 MIL/uL (ref 3.87–5.11)
RDW: 12 % (ref 11.5–15.5)
WBC Count: 5.4 10*3/uL (ref 4.0–10.5)
nRBC: 0 % (ref 0.0–0.2)

## 2021-04-16 LAB — CMP (CANCER CENTER ONLY)
ALT: 21 U/L (ref 0–44)
AST: 20 U/L (ref 15–41)
Albumin: 4.3 g/dL (ref 3.5–5.0)
Alkaline Phosphatase: 47 U/L (ref 38–126)
Anion gap: 6 (ref 5–15)
BUN: 21 mg/dL (ref 8–23)
CO2: 30 mmol/L (ref 22–32)
Calcium: 10 mg/dL (ref 8.9–10.3)
Chloride: 102 mmol/L (ref 98–111)
Creatinine: 0.85 mg/dL (ref 0.44–1.00)
GFR, Estimated: >60 mL/min (ref >60)
Glucose, Bld: 99 mg/dL (ref 70–99)
Potassium: 4.2 mmol/L (ref 3.5–5.1)
Sodium: 138 mmol/L (ref 135–145)
Total Bilirubin: 0.7 mg/dL (ref 0.3–1.2)
Total Protein: 6.8 g/dL (ref 6.5–8.1)

## 2021-04-17 ENCOUNTER — Encounter: Payer: Self-pay | Admitting: Family

## 2021-04-19 LAB — FERRITIN: Ferritin: 53 ng/mL (ref 11–307)

## 2021-04-19 LAB — IRON AND TIBC
Iron: 119 ug/dL (ref 41–142)
Saturation Ratios: 33 % (ref 21–57)
TIBC: 365 ug/dL (ref 236–444)
UIBC: 246 ug/dL (ref 120–384)

## 2021-04-19 NOTE — Telephone Encounter (Signed)
Pt last seen by Dr. Wallace.  

## 2021-04-26 ENCOUNTER — Other Ambulatory Visit: Payer: Self-pay

## 2021-04-26 ENCOUNTER — Encounter (INDEPENDENT_AMBULATORY_CARE_PROVIDER_SITE_OTHER): Payer: Self-pay | Admitting: Family Medicine

## 2021-04-26 ENCOUNTER — Ambulatory Visit (INDEPENDENT_AMBULATORY_CARE_PROVIDER_SITE_OTHER): Payer: Medicare HMO | Admitting: Family Medicine

## 2021-04-26 VITALS — BP 121/76 | HR 57 | Temp 98.2°F | Ht 59.0 in | Wt 165.0 lb

## 2021-04-26 DIAGNOSIS — I10 Essential (primary) hypertension: Secondary | ICD-10-CM | POA: Diagnosis not present

## 2021-04-26 DIAGNOSIS — E782 Mixed hyperlipidemia: Secondary | ICD-10-CM | POA: Diagnosis not present

## 2021-04-26 DIAGNOSIS — R7301 Impaired fasting glucose: Secondary | ICD-10-CM

## 2021-04-26 DIAGNOSIS — Z6839 Body mass index (BMI) 39.0-39.9, adult: Secondary | ICD-10-CM | POA: Diagnosis not present

## 2021-04-26 NOTE — Progress Notes (Signed)
Chief Complaint:   OBESITY Laura Bush is here to discuss her progress with her obesity treatment plan along with follow-up of her obesity related diagnoses.   Today's visit was #: 52 Starting weight: 196 lbs Starting date: 10/17/2019 Today's weight: 165 lbs Today's date: 04/26/2021 Weight change since last visit: 4 lbs Total lbs lost to date: 31 lbs Body mass index is 33.33 kg/m.  Total weight loss percentage to date: -15.82%  Current Meal Plan: the [place] + chicken for 95% of the time.  Current Exercise Plan: Walking 2 miles/yoga for 60 minutes 7 times per week. Current Anti-Obesity Medications: Ozempic 1 mg subcutaneously weekly. Side effects: None.  Interim History:  Laura Bush says she is doing well.  Tolerating Ozempic.  Endorses mild constipation.  Denies polyphagia.  Assessment/Plan:   1. Impaired fasting glucose, with polyphagia Controlled. Current treatment: Ozempic 1 mg subcutaneously weekly. She will continue to focus on protein-rich, low simple carbohydrate foods. We reviewed the importance of hydration, regular exercise for stress reduction, and restorative sleep.  2. Essential hypertension At goal. Medications: amiloride 5 mg daily, aspirin 81 mg daily, Coreg 6.25 mg twice daily, HCTZ 12.5 mg daily, telmisartan 80 mg daily.   Plan: Avoid buying foods that are: processed, frozen, or prepackaged to avoid excess salt. We will watch for signs of hypotension as she continues lifestyle modifications.  BP Readings from Last 3 Encounters:  04/26/21 121/76  04/05/21 122/80  03/22/21 (!) 144/80   Lab Results  Component Value Date   CREATININE 0.85 04/16/2021   3. Mixed hyperlipidemia Course: Not optimized. Lipid-lowering medications: Zocor 20 mg daily.   Plan: Dietary changes: Increase soluble fiber, decrease simple carbohydrates, decrease saturated fat. Exercise changes: Moderate to vigorous-intensity aerobic activity 150 minutes per week or as tolerated. We  will continue to monitor along with PCP/specialists as it pertains to her weight loss journey.  Lab Results  Component Value Date   CHOL 156 10/05/2020   HDL 54.30 10/05/2020   LDLCALC 66 10/05/2020   TRIG 182.0 (H) 10/05/2020   CHOLHDL 3 10/05/2020   Lab Results  Component Value Date   ALT 21 04/16/2021   AST 20 04/16/2021   ALKPHOS 47 04/16/2021   BILITOT 0.7 04/16/2021   4. Obesity, current BMI 33.4  Course: Laura Bush is currently in the action stage of change. As such, her goal is to continue with weight loss efforts.   Nutrition goals: She has agreed to the [place] + chicken.   Exercise goals:  As is.  Behavioral modification strategies: increasing lean protein intake, decreasing simple carbohydrates, increasing vegetables, and increasing water intake.  Laura Bush has agreed to follow-up with our clinic in 4 weeks. She was informed of the importance of frequent follow-up visits to maximize her success with intensive lifestyle modifications for her multiple health conditions.   Objective:   Blood pressure 121/76, pulse (!) 57, temperature 98.2 F (36.8 C), temperature source Oral, height 4\' 11"  (1.499 m), weight 165 lb (74.8 kg), SpO2 97 %. Body mass index is 33.33 kg/m.  General: Cooperative, alert, well developed, in no acute distress. HEENT: Conjunctivae and lids unremarkable. Cardiovascular: Regular rhythm.  Lungs: Normal work of breathing. Neurologic: No focal deficits.   Lab Results  Component Value Date   CREATININE 0.85 04/16/2021   BUN 21 04/16/2021   NA 138 04/16/2021   K 4.2 04/16/2021   CL 102 04/16/2021   CO2 30 04/16/2021   Lab Results  Component Value Date  ALT 21 04/16/2021   AST 20 04/16/2021   ALKPHOS 47 04/16/2021   BILITOT 0.7 04/16/2021   Lab Results  Component Value Date   HGBA1C 5.2 04/06/2020   HGBA1C 5.6 04/03/2019   HGBA1C 5.6 03/28/2018   Lab Results  Component Value Date   INSULIN 12.6 10/17/2019   Lab Results   Component Value Date   TSH 0.88 10/05/2020   Lab Results  Component Value Date   CHOL 156 10/05/2020   HDL 54.30 10/05/2020   LDLCALC 66 10/05/2020   TRIG 182.0 (H) 10/05/2020   CHOLHDL 3 10/05/2020   Lab Results  Component Value Date   VD25OH 44.11 10/05/2020   VD25OH 38.52 04/03/2019   VD25OH 43.05 03/22/2017   Lab Results  Component Value Date   WBC 5.4 04/16/2021   HGB 12.9 04/16/2021   HCT 37.7 04/16/2021   MCV 96.7 04/16/2021   PLT 196 04/16/2021   Lab Results  Component Value Date   IRON 119 04/16/2021   TIBC 365 04/16/2021   FERRITIN 53 04/16/2021   Obesity Behavioral Intervention:   Approximately 15 minutes were spent on the discussion below.  ASK: We discussed the diagnosis of obesity with Laura Bush today and Laura Bush agreed to give [place] permission to discuss obesity behavioral modification therapy today.  ASSESS: Laura Bush has the diagnosis of obesity and her BMI today is 33.4. Addis is in the action stage of change.   ADVISE: Tashena was educated on the multiple health risks of obesity as well as the benefit of weight loss to improve her health. She was advised of the need for long term treatment and the importance of lifestyle modifications to improve her current health and to decrease her risk of future health problems.  AGREE: Multiple dietary modification options and treatment options were discussed and Shawanda agreed to follow the recommendations documented in the above note.  ARRANGE: Laura Bush was educated on the importance of frequent visits to treat obesity as outlined per CMS and USPSTF guidelines and agreed to schedule her next follow up appointment today.  Attestation Statements:   Reviewed by clinician on day of visit: allergies, medications, problem list, medical history, surgical history, family history, social history, and previous encounter notes.  I, Water quality scientist, CMA, am acting as transcriptionist for Briscoe Deutscher, DO  I have reviewed the  above documentation for accuracy and completeness, and I agree with the above. Briscoe Deutscher, DO

## 2021-04-27 ENCOUNTER — Ambulatory Visit (INDEPENDENT_AMBULATORY_CARE_PROVIDER_SITE_OTHER): Payer: Medicare HMO | Admitting: Pharmacist

## 2021-04-27 DIAGNOSIS — M8580 Other specified disorders of bone density and structure, unspecified site: Secondary | ICD-10-CM

## 2021-04-27 DIAGNOSIS — E782 Mixed hyperlipidemia: Secondary | ICD-10-CM

## 2021-04-27 DIAGNOSIS — E8881 Metabolic syndrome: Secondary | ICD-10-CM

## 2021-04-27 DIAGNOSIS — I10 Essential (primary) hypertension: Secondary | ICD-10-CM

## 2021-04-27 NOTE — Chronic Care Management (AMB) (Signed)
Chronic Care Management Pharmacy Note  04/27/2021 Name:  Laura Bush MRN:  [id number] DOB:  [birth year]   Subjective: Laura Bush is an 70 y.o. year old female who is a primary patient of Copland, Gay Filler, MD.  The CCM team was consulted for assistance with disease management and care coordination needs.    Engaged with patient by telephone for follow up visit in response to provider referral for pharmacy case management and/or care coordination services.   Consent to Services:  The patient was given information about Chronic Care Management services, agreed to services, and gave verbal consent prior to initiation of services.  Please see initial visit note for detailed documentation.   Patient Care Team: Copland, Gay Filler, MD as PCP - General (Family Medicine) Skeet Latch, MD as Attending Physician (Cardiology) Cherre Robins, PharmD (Pharmacist)  Recent office visits: 01/21/2021 - PCP (Dr Lorelei Pont) Acute visit for lip biting / left sided weakness. R/O TIA or stroke. Metfromin removed from med list - patient preference. MRI ordered. MRI showed some changes, referred to neurology.   Recent consult visits: 03/22/2021 -  [facility] (Dr Juleen China) Increase Ozempic to 66m weekly  03/05/2021 - hematology (CMilam NP) checking iron studies. Blood donation if needed.   02/10/2021 - [facility] (Dr WJuleen China Lost 5lbs. Increased Ozempic to 0.566mweekly  02/09/2021 - Neurology (Dr AtRexene AlbertsF/U facial weakness, MRI changes. Ordered ECHO and carotid doppler. Both showed no significant issues. Plans to also repeat MRI with contast.   01/11/2021 - [facility] (Dr WaJuleen ChinaF/U. Gained 3lbs. Started Ozempic 0.2540meekly.  [facility]/U  hemochromatosis. CBC stable. Continued blood donations prn.  11/30/20 W[facility] Management (Dr WalJuleen ChinaO) F/U weight loss. Lost total of 26lbs since 10/17/2019  11/11/2020 Cardio (Dr RanOval Linsey/U - no  med changes.    Hospital visits: None in previous 6 months  Objective:  Lab Results  Component Value Date   CREATININE 0.85 04/16/2021   CREATININE 0.87 03/05/2021   CREATININE 0.84 02/04/2021    Lab Results  Component Value Date   HGBA1C 5.2 04/06/2020   Last diabetic Eye exam: No results found for: HMDIABEYEEXA  Last diabetic Foot exam: No results found for: HMDIABFOOTEX      Component Value Date/Time   CHOL 156 10/05/2020 1059   TRIG 182.0 (H) 10/05/2020 1059   HDL 54.30 10/05/2020 1059   CHOLHDL 3 10/05/2020 1059   VLDL 36.4 10/05/2020 1059   LDLCALC 66 10/05/2020 1059   LDLCALC 70 04/06/2020 1059    Hepatic Function Latest Ref Rng & Units 04/16/2021 03/05/2021 02/04/2021  Total Protein 6.5 - 8.1 g/dL 6.8 7.1 6.9  Albumin 3.5 - 5.0 g/dL 4.3 4.3 4.4  AST 15 - 41 U/L 20 18 20   ALT 0 - 44 U/L 21 25 24   Alk Phosphatase 38 - 126 U/L 47 50 52  Total Bilirubin 0.3 - 1.2 mg/dL 0.7 0.4 0.6    Lab Results  Component Value Date/Time   TSH 0.88 10/05/2020 10:59 AM   TSH 1.370 10/17/2019 12:06 PM   FREET4 1.00 10/17/2019 12:06 PM    CBC Latest Ref Rng & Units 04/16/2021 03/05/2021 02/04/2021  WBC 4.0 - 10.5 K/uL 5.4 5.8 3.7(L)  Hemoglobin 12.0 - 15.0 g/dL 12.9 13.6 12.9  Hematocrit 36.0 - 46.0 % 37.7 39.9 38.3  Platelets 150 - 400 K/uL 196 205 169    Lab Results  Component Value Date/Time   VD25OH 44.11 10/05/2020 10:59  AM   VD25OH 38.52 04/03/2019 10:37 AM    Clinical ASCVD:  Yes - suspected TIA based in MRI; unable to determine when occurred. Aspirin 71m daily started by neurologist 02/09/2021 The ASCVD Risk score (Arnett DK, et al., 2019) failed to calculate for the following reasons:   The patient has a prior MI or stroke diagnosis     Social History   Tobacco Use  Smoking Status Never  Smokeless Tobacco Never   BP Readings from Last 3 Encounters:  04/26/21 121/76  04/05/21 122/80  03/22/21 (!) 144/80   Pulse Readings from Last 3 Encounters:  04/26/21  (!) 57  04/05/21 70  03/22/21 (!) 56   Wt Readings from Last 3 Encounters:  04/26/21 165 lb (74.8 kg)  04/05/21 175 lb (79.4 kg)  03/22/21 169 lb (76.7 kg)    Assessment: Review of patient past medical history, allergies, medications, health status, including review of consultants reports, laboratory and other test data, was performed as part of comprehensive evaluation and provision of chronic care management services.   SDOH:  (Social Determinants of Health) assessments and interventions performed:  SDOH Interventions    Flowsheet Row Most Recent Value  SDOH Interventions   Financial Strain Interventions Intervention Not Indicated  Physical Activity Interventions Intervention Not Indicated       CCM Care Plan  Allergies  Allergen Reactions   Diclofenac Hypertension   Amlodipine Swelling   Lanolin    Tape Other (See Comments)    Redness (Bandaids also)    Medications Reviewed Today     Reviewed by ECherre Robins PharmD (Pharmacist) on 04/27/21 at 1011  Med List Status: <None>   Medication Order Taking? Sig Documenting Provider Last Dose Status Informant  ACETAMINOPHEN PO [id number] Take 650 mg by mouth 2 (two) times daily as needed (for shoulder and other mild to moderate pain). [provider] Taking Active   aMILoride (MIDAMOR) 5 MG tablet [id number] Take 1 tablet (5 mg total) by mouth daily. Copland, JGay Filler MD Taking Active   aspirin EC 81 MG tablet [id number] Take 81 mg by mouth daily. Swallow whole. [provider] Taking Active   B Complex-C (B-COMPLEX WITH VITAMIN C) tablet [id number] Take 1 tablet by mouth daily. [provider] Taking Active   CALCIUM CARBONATE-VITAMIN D PO [id number] Take 1 tablet by mouth 2 (two) times daily. 6054mcalcium, D3 (1000)IU [provider] Taking Active   carvedilol (COREG) 6.25 MG tablet [id number] Take 1 tablet (6.25 mg total) by mouth 2 (two) times daily with a meal.  Copland, JeGay FillerMD Taking Active   Elderberry 575 MG/5ML SYRP [id number] Take by mouth daily. [provider] Taking Active   Fexofenadine HCl (ALLEGRA PO) [id number] Take 1 tablet by mouth daily as needed. [provider] Taking Active   hydrochlorothiazide (MICROZIDE) 12.5 MG capsule [id number] TAKE 1 CAPSULE BY MOUTH EVERY DAY Copland, JeGay FillerMD Taking Active   ibuprofen (ADVIL) 200 MG tablet [id number] Take 400 mg by mouth daily as needed (for pain not relieved by Tylenol / acetaminophen). [provider] Taking Active   ipratropium (ATROVENT) 0.06 % nasal spray [id number] Place 2 sprays into both nostrils 4 (four) times daily. Use as needed Copland, JeGay FillerMD Taking Active   pantoprazole (PROTONIX) 40 MG tablet [id number] Take 1 tablet (40 mg total) by mouth daily. PeIrene ShipperMD Taking Active   Semaglutide, 1 MG/DOSE, (OZEMPIC, 1  MG/DOSE,) 4 MG/3ML SOPN [id number] Yes Inject 1 mg into the skin once a week. Briscoe Deutscher, DO Taking Active   simvastatin (ZOCOR) 20 MG tablet [id number] Yes Take 1 tablet (20 mg total) by mouth daily. Copland, Gay Filler, MD Taking Active   telmisartan (MICARDIS) 80 MG tablet [id number] Yes Take 1 tablet (80 mg total) by mouth daily. Copland, Gay Filler, MD Taking Active             Patient Active Problem List   Diagnosis Date Noted   Hemochromatosis 01/21/2021   Obesity (BMI 30-39.9) 29/79/8921   Metabolic syndrome 19/41/7408   Mixed hyperlipidemia 04/21/2020   Polyp of ascending colon 04/21/2020   History of breast cancer, Tamoxifen, nearing five years, Dr. 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   Vitamin D deficiency 01/29/2016   Eczema 12/29/2014   Venous insufficiency 10/16/2014   Essential hypertension 10/01/2014    Hypercholesteremia 10/01/2014   GERD (gastroesophageal reflux disease) 10/01/2014   Other bilateral bundle branch block 06/23/2003    Immunization History  Administered Date(s) Administered   Fluad Quad(high Dose 65+) 04/03/2019, 04/29/2020   Hepatitis A 08/24/1995, 12/23/1996   Hepatitis B 12/23/1996, 02/14/1997, 05/24/1997, 06/04/1998   IPV 08/24/1995   Influenza Split 04/06/2017   Influenza, High Dose Seasonal PF 05/10/2016, 04/06/2017   Influenza,inj,Quad PF,6+ Mos 04/30/2015   Influenza-Unspecified 07/07/2005, 06/08/2006, 05/28/2007, 04/23/2008, 05/23/2011, 05/14/2012, 05/20/2013, 04/12/2018, 04/03/2019   Meningococcal Conjugate 05/07/2008   Meningococcal Mcv4o 05/07/2008   Moderna SARS-COV2 Booster Vaccination 12/22/2020   PFIZER(Purple Top)SARS-COV-2 Vaccination 09/13/2019, 10/08/2019, 05/21/2020   Pneumococcal Conjugate-13 03/22/2017   Pneumococcal Polysaccharide-23 06/08/2006, 03/28/2018   Tdap 09/19/2011   Typhoid Inactivated 05/07/2008   Yellow Fever 05/08/2008   Zoster Recombinat (Shingrix) 10/06/2018, 01/18/2019   Zoster, Live 06/21/2013    Conditions to be addressed/monitored: HTN, HLD, and metabolic syndrome; venous insufficiency; history of TIA; history of breast cancer; osteopenia; eczema; vitamin D deficiency   Care Plan : General Pharmacy (Adult)  Updates made by Cherre Robins, PHARMD since 04/27/2021 12:00 AM     Problem: Metabolic syndrome; hypertension; hyperlipidemia; venous insufficiancy; osteopenia; OSA; history of breast cancer;      Goal: Patient-Specific Goal   Note:    Current Barriers: Chronic Disease Management support, education, and care coordination needs related to hypertension, elevated lipids, Metabolic Syndrome, GERD, Allergic Rhinitis, Osteopenia, History of Breast Cancer  Pharmacist Clinical Goal(s):  Over the next 180 days, patient will achieve adherence to monitoring guidelines and medication adherence to achieve therapeutic  efficacy maintain control of blood pressure and hyperlipidemia as evidenced by maintaining goals listed below  through collaboration with PharmD and provider.   Interventions: 1:1 collaboration with Copland, Gay Filler, MD regarding development and update of comprehensive plan of care as evidenced by provider attestation and co-signature Inter-disciplinary care team collaboration (see longitudinal plan of care) Comprehensive medication review performed; medication list updated in electronic medical record   Hypertension      BP Readings from Last 3 Encounters:  10/23/20 128/81  10/05/20 128/82  09/24/20 132/86    Pharmacist Clinical Goal(s): Over the next 180 days, patient will work with PharmD and providers to maintain BP goal <140/90 Current regimen:  Telmisartan 12m 1 tablet daily Hydrochlorothiazide 12.547mtake 1 capsule daily Carvedilol 6.2524m tablet twice daily Amiloride 5mg38m tablet daily Patient self care activities -  Over the next 180 days, patient will: Continue to check blood pressure 3 times per week and document, and provide at future appointments Ensure daily salt intake < 2300 mg/day Continue current medications for blood pressure control Limit intake of NSAIDs as they have increased blood pressure in past   Hyperlipidemia          Lab Results  Component Value Date/Time    LDLCALC 66 10/05/2020 10:59 AM    LDLCALC 70 04/06/2020 10:59 AM    Pharmacist Clinical Goal(s): Over the next 180 days, patient will work with PharmD and providers to maintain LDL goal < 100 Current regimen:  Simvastatin 78m daily in evening Patient self care activities - Over the next 180 days, patient will: Maintain cholesterol medication regimen.    Metabolic Syndrome Pharmacist Clinical Goal(s): Over the next 180 days, patient will work with PharmD and providers to maintain A1c goal <6.5% Current regimen:  Ozempic 168minjected subcutaneously weekly Patient is seeing Dr WaJuleen Chinaat weight management clinic. Has lost a total of 31lbs; starting weight was 196 lbs Current weight: 165 lbs Following modified vegetarian diet, adding fish and chicken.  Exercise: usually walking 1.5 to 2 miles daily. Trying to work back up to pre COVID 3 mile walks.  She initially took metformin but weight had stalled so Ozempic was started 01/11/2021. Had made a big difference and she has started to lose weight again.  She did mention to Dr WaJuleen Chinahat she was having some constipation that she though was related to OzNewportDr WaJuleen Chinaecommended Miralax but patient was not sure how much to take.  Patient is in Medicare coverage gap and last 90 day supply of Ozempic was $400.  Interventions: Screened for patient assistance for Ozempic. Household income too high (patients husband just retired from his full time job a few weeks ago)  Recommended she take 1 capful of Miralax daily. She was decrease frequency if starts to have frequent or loose stools.  Patient self care activities - Over the next 180 days, patient will: Maintain a1c <6.5% Continue follow up with weight management clinic and recommended diet. Great job! Continue to walk daily, increasing how long / far you are walking as able.    Medication management Pharmacist Clinical Goal(s): Over the next 180 days, patient will work with PharmD and providers to maintain optimal medication adherence [facility] Comprehensive medication review performed. Patient self care activities - Over the next 180 days, patient will: Focus on medication adherence by filling and taking medications appropriately  Take medications as prescribed Report any questions or concerns to PharmD and/or provider(s)   Health Maintenance:  Discussed getting annual flu vaccine and new COVID bivalent vaccine  Patient Goals/Self-Care Activities Over the next 180 days, patient will:  take medications as prescribed  target a  minimum of 150 minutes of moderate intensity exercise weekly       Medication Assistance:  Screened for Ozempic medication assistance program today, household income too high (patient's husband has just retired in the last few weeks from working full time.   Patient's preferred pharmacy is:  [address] [id number]: [phone]: [id number]: [phone]: [id number] [place] [place]  [place] [address] Phone: [phone] Fax: [place] [address], [place] [address] Phone: [phone] Fax: [phone]  [place], [place] [address] Phone: [phone] Fax: [phone]   Follow Up:  Patient agrees to Care Plan and Follow-up.  Plan: Telephone follow up appointment with care management team member scheduled for:  6 months  Cherre Robins, PharmD Clinical Pharmacist [place] [facility]

## 2021-04-27 NOTE — Patient Instructions (Signed)
Link for scheduling COVID vaccine at [place] - but you can also walk in for vaccines.   [email or web address]  Visit Information  PATIENT GOALS:  Goals Addressed             This Visit's Progress    Pharmacy Chronic Care Management Goals        Current Barriers: Chronic Disease Management support, education, and care coordination needs related to hypertension, elevated lipids, Metabolic Syndrome, GERD, Allergic Rhinitis, Osteopenia, History of Breast Cancer  Pharmacist Clinical Goal(s):  Over the next 180 days, patient will achieve adherence to monitoring guidelines and medication adherence to achieve therapeutic efficacy maintain control of blood pressure and hyperlipidemia as evidenced by maintaining goals listed below  through collaboration with PharmD and provider.   Interventions: 1:1 collaboration with Copland, Gay Filler, MD regarding development and update of comprehensive plan of care as evidenced by provider attestation and co-signature Inter-disciplinary care team collaboration (see longitudinal plan of care) Comprehensive medication review performed; medication list updated in electronic medical record   Hypertension      BP Readings from Last 3 Encounters:  10/23/20 128/81  10/05/20 128/82  09/24/20 132/86    Pharmacist Clinical Goal(s): Over the next 180 days, patient will work with PharmD and providers to maintain BP goal <140/90 Current regimen:  Telmisartan 80mg  1 tablet daily Hydrochlorothiazide 12.5mg  take 1 capsule daily Carvedilol 6.25mg  1 tablet twice daily Amiloride 5mg   1 tablet daily Patient self care activities - Over the next 180 days, patient will: Continue to check blood pressure 3 times per week and document, and provide at future appointments Ensure daily salt intake < 2300 mg/day Continue current medications for blood pressure control Limit intake of  NSAIDs as they have increased blood pressure in past   Hyperlipidemia          Lab Results  Component Value Date/Time    LDLCALC 66 10/05/2020 10:59 AM    LDLCALC 70 04/06/2020 10:59 AM    Pharmacist Clinical Goal(s): Over the next 180 days, patient will work with PharmD and providers to maintain LDL goal < 100 Current regimen:  Simvastatin 20mg  daily in evening Patient self care activities - Over the next 180 days, patient will: Maintain cholesterol medication regimen.    Metabolic Syndrome Pharmacist Clinical Goal(s): Over the next 180 days, patient will work with PharmD and providers to maintain A1c goal <6.5% Current regimen:  Ozempic 1mg  injected subcutaneously weekly Has lost a total of 31lbs; starting weight was 196 lbs Current weight: 165 lbs Interventions: Screened for patient assistance for Ozempic.  Recommended she take 1 capful of Miralax daily. She can decrease frequency if starts to have frequent or loose stools.  Patient self care activities - Over the next 180 days, patient will: Maintain a1c <6.5% Continue follow up with weight management clinic and recommended diet. Great job! Continue to walk daily, increasing how long / far you are walking as able.  Start Miralax - 1 capful mixed with beverage of choice once a day for constipation. Decrease to either 1/2 capful or every other day if you experience frequent or loose bowel movement.    Medication management Pharmacist Clinical Goal(s): Over the next 180 days, patient will work with PharmD and providers to maintain optimal medication adherence [facility] Comprehensive medication review performed. Patient self care activities - Over the next 180 days, patient will: Focus on medication adherence by filling and taking medications appropriately  Take medications  as prescribed Report any questions or concerns to PharmD and/or provider(s)   Health Maintenance:  Don't  forget to get you annual flu vaccine this season so you are protected. The vaccine is available at [facility] office, downstairs at the [place] or at your preferred local pharmacy.   We are also starting to administer the updated bivalent COVID-19 booster. If your last COVID-19 booster was at least 2 months ago, make sure to get the bivalent COVID-19 booster.   Patient Goals/Self-Care Activities Over the next 180 days, patient will:  take medications as prescribed  target a minimum of 150 minutes of moderate intensity exercise weekly         Patient verbalizes understanding of instructions provided today and agrees to view in [place].   Telephone follow up appointment with care management team member scheduled for: 6 months  Cherre Robins, PharmD Clinical Pharmacist [place] [facility]

## 2021-05-07 ENCOUNTER — Other Ambulatory Visit (HOSPITAL_BASED_OUTPATIENT_CLINIC_OR_DEPARTMENT_OTHER): Payer: Self-pay

## 2021-05-07 DIAGNOSIS — E782 Mixed hyperlipidemia: Secondary | ICD-10-CM

## 2021-05-07 DIAGNOSIS — I10 Essential (primary) hypertension: Secondary | ICD-10-CM | POA: Diagnosis not present

## 2021-05-07 MED ORDER — INFLUENZA VAC A&B SA ADJ QUAD 0.5 ML IM PRSY
PREFILLED_SYRINGE | INTRAMUSCULAR | 0 refills | Status: DC
  Filled 2021-05-07: qty 0.5, 1d supply, fill #0

## 2021-05-12 ENCOUNTER — Encounter: Payer: Self-pay | Admitting: Neurology

## 2021-05-13 ENCOUNTER — Ambulatory Visit: Payer: Medicare HMO | Admitting: Neurology

## 2021-05-13 ENCOUNTER — Encounter: Payer: Self-pay | Admitting: Neurology

## 2021-05-13 VITALS — BP 128/79 | HR 58 | Ht 59.0 in | Wt 172.4 lb

## 2021-05-13 DIAGNOSIS — Z9989 Dependence on other enabling machines and devices: Secondary | ICD-10-CM | POA: Diagnosis not present

## 2021-05-13 DIAGNOSIS — G4733 Obstructive sleep apnea (adult) (pediatric): Secondary | ICD-10-CM

## 2021-05-13 DIAGNOSIS — R29810 Facial weakness: Secondary | ICD-10-CM

## 2021-05-13 NOTE — Progress Notes (Signed)
Subjective:    Patient ID: Laura Bush is a 70 y.o. female.  HPI    Interim history:   Laura Bush is a 70 year old female with an underlying medical history of hypertension, hyperlipidemia, heart murmur, history of cluster headaches, reflux disease, breast cancer, status post radiation therapy and surgery, on tamoxifen, asthma, history of rosacea, history of venous insufficiency and pneumonia, hemochromatosis, and obesity, who presents for follow-up consultation of her sleep apnea as well as her history of facial droop.  I saw her in July for concern for facial weakness.  This was noted by her dentist on the left side.  She did not have any strokelike symptoms, we decided to proceed with additional testing including echocardiogram, carotid Doppler, brain MRI with and without contrast.  She had a brain MRI with and without contrast on 03/22/2021 and I reviewed the results: IMPRESSION: This MRI of the brain with and without contrast shows the following: 1.   Scattered T2/FLAIR hyperintense foci in the hemispheres and pons consistent with mild chronic microvascular ischemic change.  A small focus in the right lateral thalamus could represent either an expanded Virchow-Robin space or a chronic lacunar infarction.  None of the foci appear to be acute.  They were all present on the MRI dated 01/23/2021. 2.   Small mastoid effusion on the right.  This could be incidental or due to eustachian tube dysfunction. 3.   No acute findings.  Normal enhancement pattern.  We called her with her test results.  She had an echocardiogram on 03/03/2021 and results were benign.  In particular, her EF was 60 to 65%, normal left ventricular motion.  Mild concentric left ventricular hypertrophy was noted, no valvular abnormalities, aortic valve was tricuspid.  She had a carotid Doppler ultrasound on 03/03/2021 which showed less than 40% stenosis in bilateral ICAs and vertebral arteries showed antegrade  flow.  Today, 05/13/2021: I reviewed her AutoPap compliance data from 04/13/2021 through 05/12/2021, which is a total of 30 days, during which time she used her machine every night with percent used days greater than 4 hours at 90%, indicating excellent compliance with an average usage of 7 hours and 22 minutes, residual AHI at 0.7/h, 95th percentile of pressure at 11.9 cm, leak on the low side with a 95th percentile at 6.8 L/min on a pressure of 5 cm to 15 cm with EPR of 3.  She reports she is still waiting for her new machine, her current AutoPap machine is older and has started making abnormal noises including whistling noises and high-pitched abnormal noises which are bothersome and sometimes she just takes off her mask in the middle of the night when she cannot stand the abnormal noise.  It is not every night.  She gets an update from her DME company about the status of her AutoPap but does not have an actual set up date yet.  She is doing well, no additional neurological symptoms, has been on Ozempic for weight loss.  Her husband has recently retired and they have a celebration for him tonight.    Previously:     02/09/21: She reports that she had been noticed to have a slight facial weakness on the left side by her dentist.  She was at a routine appointment in mid June 2022 and was noted to have a left lower facial weakness.  She herself had not noticed it but did notice that she had bitten the inside of the left lower lip a couple  of times around 06 January 2021.  She did not notice any weakness elsewhere or numbness.  Just recently a few days ago when she was working on a tile floor she noticed mild numbness in the left digits 4 and 5 but when she shook her hand it went away.  She has not fallen.  She did have a slight tripping incident in the kitchen when she was wearing flip-flops and she did not pick up her left foot as well.  Overall, she feels at baseline but wanted to get checked out for these issues.   She reports a family history of brain hemorrhage.  Her father had a hemorrhagic stroke and died after the second hemorrhagic stroke.  Not sure if he had a brain aneurysm.  Patient is working on healthy lifestyle and weight loss.  She has been going to the weight management clinic and recently started on Ozempic.  She is trying to hydrate well.  She limits her alcohol to 1 or 2 glasses of wine per week.  She does not smoke.    I reviewed your office note from 01/21/2021.  You ordered a brain MRI.  She had a brain MRI without contrast on 01/23/2021 and I reviewed the results: IMPRESSION: 1. No acute or focal abnormality to explain the patient's symptoms. 2. Scattered subcortical T2 hyperintensities bilaterally are mildly advanced for age. The finding is nonspecific but can be seen in the setting of chronic microvascular ischemia, a demyelinating process such as multiple sclerosis, vasculitis, complicated migraine headaches, or as the sequelae of a prior infectious or inflammatory process.   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 She had a lipid panel on 10/05/2020 which showed a total cholesterol of 156, triglycerides 182, HDL 54.3, LDL 66.  TSH was 0.88.   She has been compliant with CPAP therapy.  She had an interim home sleep test on 10/14/2020 which showed moderate obstructive sleep apnea with an AHI of 18.8/h, O2 nadir 86%.  I wrote for new AutoPap machine.  She has not had her new machine yet, she reports that it is estimated to be delivered in mid July 2022.   She sees cardiology once a year, she sees her eye doctor once every 1 or 2 years.  She was diagnosed with hemochromatosis recently and follows with hematology.     She saw Debbora Presto, nurse practitioner on 09/01/2020, at which time she was compliant with her AutoPap.  She needed a new machine.  She had an interim home sleep test on 10/14/2020 which indicated moderate obstructive sleep  apnea with an AHI of 18.8/h, O2 nadir 86%.   04/11/19: 70 year old right-handed woman with an underlying medical history of hypertension, hyperlipidemia, heart murmur, history of cluster headaches, reflux disease, breast cancer, status post radiation therapy and surgery, on tamoxifen, asthma, history of rosacea, history of venous insufficiency and pneumonia, and obesity, who reports a prior diagnosis of obstructive sleep apnea.  She has a CPAP machine.  She has not had evaluation in over 5 years.  Her machine is from about 2014 and I was able to review her latest compliance download for the past 30 days from 03/12/2019 through 04/10/2019 during which time she was fully compliant with treatment with an average usage of 8 hours and 3 minutes which is excellent, residual AHI at goal at 1.3/h, average pressure of 12 cm, she is actually on an AutoPap with a setting of 5 to  15 cm, leak on the low side with a 95th percentile at 3.7 L/min.  Prior sleep study results are not available for my review today.  She reports that she had sleep study testing in 2014 through the [facility].  She moved with her husband in December 2015 to [place] to be closer to their son who was expecting twins.  The twins are about 70 years old now.  She has a daughter in [place] who has a 69-year-old and a 10-year-old.  She sleeps well with her CPAP, she is fully compliant with it.  Her husband also has a CPAP machine and they both get their supplies online.  She has been using nasal pillows successfully.  Her Epworth sleepiness score is 4 out of 24, fatigue severity score is 21 out of 63.  She sleeps without major interruptions, no nocturia or morning headaches are reported.  She generally is in bed around 10 and rise time is between 6 and 7.  She is retired from an [employer] based out of [employer].  Her husband still works.  She had a tonsillectomy at age 41.  Her weight has increased over time.  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  She had subsequent radiation therapy and is in her fifth year of tamoxifen.  She is a non-smoker and drinks a glass of wine essentially daily, caffeine and limitation in the form of coffee, 1 cup/day on average.  They have no pets in the household.    Her Past Medical History Is Significant For: Past Medical History:  Diagnosis Date   Asthma    in cold weather   Breast cancer ([facility]) 09/18/14   left breast   Breast cancer ([facility]) 10/07/14   bx left breast   Breast cancer of upper-outer quadrant of left female breast ([facility]) 09/22/2014   Cluster headaches    in her 69's   GERD (gastroesophageal reflux disease)    Heart murmur    as a child (has outgrown)   High cholesterol    Hypertension    Lower extremity edema    Microscopic colitis    Morbid obesity ([facility]) 09/30/2019   Obesity (BMI 30-39.9) 11/11/2020   OSA on CPAP    Personal history of radiation therapy    Pneumonia 2000's X 1   PONV (postoperative nausea and vomiting)    Rosacea    S/P radiation therapy 12/30/14-01/27/15   left breast 50Gy total dose   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"   Swallowing difficulty    Venous insufficiency     Her Past Surgical History Is Significant For: Past Surgical History:  Procedure Laterality Date   ABDOMINAL HYSTERECTOMY  1999   APPENDECTOMY  ~ 2006   BREAST BIOPSY Left 10/2014   BREAST LUMPECTOMY Left 2016   BREAST REDUCTION SURGERY Bilateral 11/11/2014   Procedure: Bilateral Breast Reduction;  Surgeon: Crissie Reese, MD;  Location: [facility];  Service: Plastics;  Laterality: Bilateral;   CARPAL TUNNEL RELEASE Bilateral    2 surgeries on right, 1 on left   CESAREAN SECTION  1978; 1980   COLONOSCOPY     FOREARM FRACTURE SURGERY Right 2003 X 3   KNEE ARTHROSCOPY Bilateral    meniscus repair   PARTIAL MASTECTOMY WITH NEEDLE LOCALIZATION AND AXILLARY  SENTINEL LYMPH NODE BX Left 11/11/2014   Procedure: LEFT BREAST  PARTIAL MASTECTOMY WITH NEEDLE LOCALIZATION TIMES TWO AND LEFT AXILLARY SENTINEL LYMPH NODE Biopsy;  Surgeon: Fanny Skates, MD;  Location: [facility];  Service: General;  Laterality: Left;   SQUAMOUS CELL CARCINOMA EXCISION  1980's X 1   "nose"   TONSILLECTOMY  ~ [facility]  ?1984    Her Family History Is Significant For: Family History  Problem Relation Age of Onset   Dementia Mother        Lives in [place]   Hypertension Mother    Hyperlipidemia Mother    Stroke Father        deceased   Heart failure Father    Hypertension Father    Uterine cancer Sister 13   Heart attack Maternal Grandmother    Hypertension Maternal Grandfather    Stroke Paternal Grandfather    Colon cancer Neg Hx    Esophageal cancer Neg Hx    Pancreatic cancer Neg Hx    Stomach cancer Neg Hx    Liver disease Neg Hx    Sleep apnea Neg Hx     Her Social History Is Significant For: Social History   Socioeconomic History   Marital status: Married    Spouse name: Randy Bush   Number of children: Not on file   Years of education: Not on file   Highest education level: Not on file  Occupational History   Occupation: retired  Tobacco Use   Smoking status: Never   Smokeless tobacco: Never  Vaping Use   Vaping Use: Never used  Substance and Sexual Activity   Alcohol use: Yes    Alcohol/week: 2.0 standard drinks    Types: 2 Glasses of wine per week    Comment: 2 glasses a week   Drug use: No   Sexual activity: Not Currently  Other Topics Concern   Not on file  Social History Narrative   From [place].   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 Update 02/09/2021 retired   2 children, boy and girl   Has 4 grandchildren   Quilting, sewing, reading.   Social Determinants of Health   Financial Resource Strain: Low Risk    Difficulty of Paying Living Expenses: Not hard at all  Food  Insecurity: No Food Insecurity   Worried About Charity fundraiser in the Last Year: Never true   [place] in the Last Year: Never true  Transportation Needs: No Transportation Needs   Lack of Transportation (Medical): No   Lack of Transportation (Non-Medical): No  Physical Activity: Sufficiently Active   Days of Exercise per Week: 5 days   Minutes of Exercise per Session: 30 min  Stress: Stress Concern Present   Feeling of Stress : To some extent  Social Connections: Engineer, building services of Communication with Friends and Family: More than three times a week   Frequency of Social Gatherings with Friends and Family: More than three times a week   Attends Religious Services: More than 4 times per year   Active Member of [employer] or Organizations: Yes   Attends Archivist Meetings: 1 to 4 times per year   Marital Status: Married    Her Allergies Are:  Allergies  Allergen Reactions   Diclofenac Hypertension   Amlodipine Swelling   Lanolin    Tape Other (See Comments)    Redness (Bandaids also)  :   Her Current Medications Are:  Outpatient Encounter Medications as of  05/13/2021  Medication Sig   ACETAMINOPHEN PO Take 650 mg by mouth 2 (two) times daily as needed (for shoulder and other mild to moderate pain).   aMILoride (MIDAMOR) 5 MG tablet Take 1 tablet (5 mg total) by mouth daily.   aspirin EC 81 MG tablet Take 81 mg by mouth daily. Swallow whole.   B Complex-C (B-COMPLEX WITH VITAMIN C) tablet Take 1 tablet by mouth daily.   CALCIUM CARBONATE-VITAMIN D PO Take 1 tablet by mouth 2 (two) times daily. 600mg  calcium, D3 (1000)IU   carvedilol (COREG) 6.25 MG tablet Take 1 tablet (6.25 mg total) by mouth 2 (two) times daily with a meal.   Elderberry 575 MG/5ML SYRP Take by mouth daily.   Fexofenadine HCl (ALLEGRA PO) Take 1 tablet by mouth daily as needed.   hydrochlorothiazide (MICROZIDE) 12.5 MG capsule TAKE 1 CAPSULE BY MOUTH EVERY DAY   ibuprofen  (ADVIL) 200 MG tablet Take 400 mg by mouth daily as needed (for pain not relieved by Tylenol / acetaminophen).   influenza vaccine adjuvanted (FLUAD) 0.5 ML injection Inject into the muscle.   ipratropium (ATROVENT) 0.06 % nasal spray Place 2 sprays into both nostrils 4 (four) times daily. Use as needed   pantoprazole (PROTONIX) 40 MG tablet Take 1 tablet (40 mg total) by mouth daily.   Semaglutide, 1 MG/DOSE, (OZEMPIC, 1 MG/DOSE,) 4 MG/3ML SOPN Inject 1 mg into the skin once a week.   simvastatin (ZOCOR) 20 MG tablet Take 1 tablet (20 mg total) by mouth daily.   telmisartan (MICARDIS) 80 MG tablet Take 1 tablet (80 mg total) by mouth daily.   No facility-administered encounter medications on file as of 05/13/2021.  :  Review of Systems:  Out of a complete 14 point review of systems, all are reviewed and negative with the exception of these symptoms as listed below:  Review of Systems  Neurological:        Pt is here for follow visit for CPAP. Pt states she needs a new CPAP and supplies . Pt states they have been trying to get one for her since march . Pt states she is aslo here to discuss stroke this pass summer . Not sure of date    FSS:22 ESS:3   Objective:  Neurological Exam  Physical Exam Physical Examination:   Vitals:   05/13/21 1041  BP: 128/79  Pulse: (!) 58    General Examination: The patient is a very pleasant 70 y.o. female in no acute distress. She appears well-developed and well-nourished and well groomed. Good spirits.   HEENT: Normocephalic, atraumatic, pupils are equal, round and reactive to light, extraocular tracking is well-preserved, corrective eyeglasses in place, mild cataracts, hearing grossly intact.  Face is symmetric, unchanged slight effacement of left nasolabial fold.  Speech is clear, no dysarthria, no voice tremor, airway examination is stable, neck is supple, no carotid bruits.  Tongue protrudes centrally and palate elevates symmetrically.     Chest: Clear to auscultation without wheezing, rhonchi or crackles noted.   Heart: S1+S2+0, regular and normal without murmurs, rubs or gallops noted.   Abdomen: Soft, non-tender and non-distended.   Extremities: There is no obvious edema.    Skin: Warm and dry without trophic changes noted. There are no varicose veins.   Musculoskeletal: exam reveals arthritic changes in both hands.     Neurologically: Mental status: The patient is awake, alert and oriented in all 4 spheres. Her immediate and remote memory, attention, language skills and fund of  knowledge are appropriate. There is no evidence of aphasia, agnosia, apraxia or anomia. Speech is clear with normal prosody and enunciation. Thought process is linear. Mood is normal and affect is normal. Cranial nerves II - XII are as described above under HEENT exam.  Motor exam: Normal bulk, strength and tone is noted.  No tremor.  Fine motor skills and coordination: Intact grossly in the upper and lower extremities.   Cerebellar testing: No dysmetria or intention tremor. There is no truncal or gait ataxia. Sensory exam: intact to light touch in the upper and lower extremities. Gait, station and balance: She stands easily. No veering to one side is noted. No leaning to one side is noted. Posture is age-appropriate and stance is narrow based. Gait shows normal stride length and normal pace. No problems turning are noted.               Assessment and Plan:  In summary, Laura Bush is a very pleasant 70 year old female with an underlying medical history of hypertension, hyperlipidemia, heart murmur, history of cluster headaches, reflux disease, breast cancer, status post radiation therapy and surgery, on tamoxifen, asthma, history of rosacea, history of venous insufficiency and pneumonia, hemochromatosis, and obesity, who presents for follow-up consultation of her obstructive sleep apnea as well as left facial weakness.  She has done well, she  stable, she continues to use her AutoPap.  She should be eligible for new machine, I have written for a new machine she is still waiting.  She is compliant with AutoPap therapy and has benefited from it.  She is commended for her treatment adherence.  She has had no new neurological symptoms, work-up was benign, repeat brain MRI with and without contrast from August was benign, echocardiogram and carotid Doppler study from late July 2022 were also benign.  We talked about her test results and her risk factors.  She is commended for her healthy lifestyle.  She is working on weight loss.  She is advised to follow-up routinely after she starts her new AutoPap machine, she will need to be seen within 31 to 89 days after set up. After that, we will maintain a yearly checkup routinely.  I answered all her questions today and she was in agreement with the plan. I spent 40 minutes in total face-to-face time and in reviewing records and all test results, during pre-charting, more than 50% of which was spent in counseling and coordination of care, reviewing test results, reviewing medications and treatment regimen and/or in discussing or reviewing the diagnosis of OSA, facial weakness, the prognosis and treatment options. Pertinent laboratory and imaging test results that were available during this visit with the patient were reviewed by me and considered in my medical decision making (see chart for details).

## 2021-05-24 ENCOUNTER — Ambulatory Visit: Payer: Medicare HMO

## 2021-05-24 NOTE — Progress Notes (Signed)
   Covid-19 Vaccination Clinic  Name:  Laura Bush    MRN: [id number] DOB: 1951-02-02  05/24/2021  Laura Bush was observed post Covid-19 immunization for 15 minutes without incident. She was provided with Vaccine Information Sheet and instruction to access the V-Safe system.   Laura Bush was instructed to call 911 with any severe reactions post vaccine: Difficulty breathing  Swelling of face and throat  A fast heartbeat  A bad rash all over body  Dizziness and weakness

## 2021-05-27 DIAGNOSIS — G4733 Obstructive sleep apnea (adult) (pediatric): Secondary | ICD-10-CM | POA: Diagnosis not present

## 2021-05-28 ENCOUNTER — Encounter: Payer: Self-pay | Admitting: Neurology

## 2021-06-02 ENCOUNTER — Other Ambulatory Visit: Payer: Self-pay

## 2021-06-02 ENCOUNTER — Ambulatory Visit (INDEPENDENT_AMBULATORY_CARE_PROVIDER_SITE_OTHER): Payer: Medicare HMO | Admitting: Family Medicine

## 2021-06-02 ENCOUNTER — Encounter (INDEPENDENT_AMBULATORY_CARE_PROVIDER_SITE_OTHER): Payer: Self-pay | Admitting: Family Medicine

## 2021-06-02 VITALS — BP 110/70 | HR 61 | Temp 97.9°F | Ht 59.0 in | Wt 166.0 lb

## 2021-06-02 DIAGNOSIS — I10 Essential (primary) hypertension: Secondary | ICD-10-CM

## 2021-06-02 DIAGNOSIS — R7301 Impaired fasting glucose: Secondary | ICD-10-CM

## 2021-06-02 DIAGNOSIS — E782 Mixed hyperlipidemia: Secondary | ICD-10-CM | POA: Diagnosis not present

## 2021-06-02 DIAGNOSIS — Z6839 Body mass index (BMI) 39.0-39.9, adult: Secondary | ICD-10-CM

## 2021-06-03 ENCOUNTER — Other Ambulatory Visit (HOSPITAL_COMMUNITY): Payer: Self-pay

## 2021-06-04 ENCOUNTER — Inpatient Hospital Stay: Payer: Medicare HMO | Attending: Family

## 2021-06-04 ENCOUNTER — Telehealth: Payer: Self-pay | Admitting: *Deleted

## 2021-06-04 ENCOUNTER — Other Ambulatory Visit: Payer: Self-pay

## 2021-06-04 ENCOUNTER — Inpatient Hospital Stay: Payer: Medicare HMO | Admitting: Family

## 2021-06-04 ENCOUNTER — Encounter: Payer: Self-pay | Admitting: Family

## 2021-06-04 DIAGNOSIS — R609 Edema, unspecified: Secondary | ICD-10-CM | POA: Diagnosis not present

## 2021-06-04 LAB — CMP (CANCER CENTER ONLY)
ALT: 21 U/L (ref 0–44)
AST: 19 U/L (ref 15–41)
Albumin: 4.3 g/dL (ref 3.5–5.0)
Alkaline Phosphatase: 52 U/L (ref 38–126)
Anion gap: 6 (ref 5–15)
BUN: 19 mg/dL (ref 8–23)
CO2: 30 mmol/L (ref 22–32)
Calcium: 10.1 mg/dL (ref 8.9–10.3)
Chloride: 103 mmol/L (ref 98–111)
Creatinine: 0.89 mg/dL (ref 0.44–1.00)
GFR, Estimated: >60 mL/min (ref >60)
Glucose, Bld: 87 mg/dL (ref 70–99)
Potassium: 4.5 mmol/L (ref 3.5–5.1)
Sodium: 139 mmol/L (ref 135–145)
Total Bilirubin: 0.6 mg/dL (ref 0.3–1.2)
Total Protein: 7.1 g/dL (ref 6.5–8.1)

## 2021-06-04 LAB — CBC WITH DIFFERENTIAL (CANCER CENTER ONLY)
Abs Immature Granulocytes: 0.01 10*3/uL (ref 0.00–0.07)
Basophils Absolute: 0 10*3/uL (ref 0.0–0.1)
Basophils Relative: 1 %
Eosinophils Absolute: 0.3 10*3/uL (ref 0.0–0.5)
Eosinophils Relative: 6 %
HCT: 38.3 % (ref 36.0–46.0)
Hemoglobin: 13.1 g/dL (ref 12.0–15.0)
Immature Granulocytes: 0 %
Lymphocytes Relative: 29 %
Lymphs Abs: 1.3 10*3/uL (ref 0.7–4.0)
MCH: 32.8 pg (ref 26.0–34.0)
MCHC: 34.2 g/dL (ref 30.0–36.0)
MCV: 95.8 fL (ref 80.0–100.0)
Monocytes Absolute: 0.6 10*3/uL (ref 0.1–1.0)
Monocytes Relative: 14 %
Neutro Abs: 2.2 10*3/uL (ref 1.7–7.7)
Neutrophils Relative %: 50 %
Platelet Count: 200 10*3/uL (ref 150–400)
RBC: 4 MIL/uL (ref 3.87–5.11)
RDW: 11.7 % (ref 11.5–15.5)
WBC Count: 4.5 10*3/uL (ref 4.0–10.5)
nRBC: 0 % (ref 0.0–0.2)

## 2021-06-04 NOTE — Telephone Encounter (Signed)
Per 06/04/21 los - gave upcoming appointments - confirmed

## 2021-06-04 NOTE — Progress Notes (Signed)
Hematology and Oncology Follow Up Visit  Laura Bush [id number] [birth year] 70 y.o. 06/04/2021   Principle Diagnosis:  Hemochromatosis, heterozygous for the H63D mutation   Current Therapy:        Phlebotomy to maintain iron saturation < 50% and ferritin < 100             Interim History:  [name] is here today for follow-up. She is doing quite well and has no complaints at this time.  Hgb is 13.1, MCV 95, platelets 200 and WBC count is 4.5.  No fever, chills, n/v, cough, rash, dizziness, SOB, chest pain, palpitations, abdominal pain or changes in bowel or bladder habits.  No blood loss to report. No bruising or petechiae.  No tenderness, numbness or tingling in her extremities at this time.  She takes HCTZ which has helps significantly reduce the fluid retention in her lower extremities.  Pedal pulses 2+.  She has maintained a good appetite and is staying well hydrated. Her weight is stable at 167 lbs.   ECOG Performance Status: 0 - Asymptomatic  Medications:  Allergies as of 06/04/2021       Reactions   Diclofenac Hypertension   Amlodipine Swelling   Lanolin    Tape Other (See Comments)   Redness (Bandaids also)        Medication List        Accurate as of June 04, 2021 11:44 AM. If you have any questions, ask your nurse or doctor.          ACETAMINOPHEN PO Take 650 mg by mouth 2 (two) times daily as needed (for shoulder and other mild to moderate pain).   ALLEGRA PO Take 1 tablet by mouth daily as needed.   aMILoride 5 MG tablet Commonly known as: MIDAMOR Take 1 tablet (5 mg total) by mouth daily.   aspirin EC 81 MG tablet Take 81 mg by mouth daily. Swallow whole.   B-complex with vitamin C tablet Take 1 tablet by mouth daily.   CALCIUM CARBONATE-VITAMIN D PO Take 1 tablet by mouth 2 (two) times daily. 600mg  calcium, D3 (1000)IU   carvedilol 6.25 MG tablet Commonly known as: COREG Take 1 tablet (6.25 mg total) by mouth 2 (two)  times daily with a meal.   Elderberry 575 MG/5ML Syrp Take by mouth daily.   Fluad Quadrivalent 0.5 ML injection Generic drug: influenza vaccine adjuvanted Inject into the muscle.   hydrochlorothiazide 12.5 MG capsule Commonly known as: MICROZIDE TAKE 1 CAPSULE BY MOUTH EVERY DAY   ibuprofen 200 MG tablet Commonly known as: ADVIL Take 400 mg by mouth daily as needed (for pain not relieved by Tylenol / acetaminophen).   ipratropium 0.06 % nasal spray Commonly known as: ATROVENT Place 2 sprays into both nostrils 4 (four) times daily. Use as needed   Ozempic (1 MG/DOSE) 4 MG/3ML Sopn Generic drug: Semaglutide (1 MG/DOSE) Inject 1 mg into the skin once a week. What changed: when to take this   pantoprazole 40 MG tablet Commonly known as: Protonix Take 1 tablet (40 mg total) by mouth daily.   simvastatin 20 MG tablet Commonly known as: ZOCOR Take 1 tablet (20 mg total) by mouth daily.   telmisartan 80 MG tablet Commonly known as: MICARDIS Take 1 tablet (80 mg total) by mouth daily.        Allergies:  Allergies  Allergen Reactions   Diclofenac Hypertension   Amlodipine Swelling   Lanolin    Tape Other (See Comments)  Redness (Bandaids also)    Past Medical History, Surgical history, Social history, and Family History were reviewed and updated.  Review of Systems: All other 10 point review of systems is negative.   Physical Exam:  weight is 167 lb (75.8 kg). Her oral temperature is 98.8 F (37.1 C). Her blood pressure is 123/66 and her pulse is 67. Her respiration is 16 and oxygen saturation is 100%.   Wt Readings from Last 3 Encounters:  06/04/21 167 lb (75.8 kg)  06/02/21 166 lb (75.3 kg)  05/13/21 172 lb 6.4 oz (78.2 kg)    Ocular: Sclerae unicteric, pupils equal, round and reactive to light Ear-nose-throat: Oropharynx clear, dentition fair Lymphatic: No cervical or supraclavicular adenopathy Lungs no rales or rhonchi, good excursion  bilaterally Heart regular rate and rhythm, no murmur appreciated Abd soft, nontender, positive bowel sounds MSK no focal spinal tenderness, no joint edema Neuro: non-focal, well-oriented, appropriate affect Breasts: Deferred   Lab Results  Component Value Date   WBC 4.5 06/04/2021   HGB 13.1 06/04/2021   HCT 38.3 06/04/2021   MCV 95.8 06/04/2021   PLT 200 06/04/2021   Lab Results  Component Value Date   FERRITIN 53 04/16/2021   IRON 119 04/16/2021   TIBC 365 04/16/2021   UIBC 246 04/16/2021   IRONPCTSAT 33 04/16/2021   Lab Results  Component Value Date   RETICCTPCT 1.5 10/17/2019   RBC 4.00 06/04/2021   No results found for: KPAFRELGTCHN, LAMBDASER, KAPLAMBRATIO No results found for: Kandis Cocking, IGMSERUM No results found for: Odetta Pink, SPEI   Chemistry      Component Value Date/Time   NA 138 04/16/2021 1046   NA 142 05/29/2017 1002   K 4.2 04/16/2021 1046   K 3.9 05/29/2017 1002   CL 102 04/16/2021 1046   CO2 30 04/16/2021 1046   CO2 28 05/29/2017 1002   BUN 21 04/16/2021 1046   BUN 13.5 05/29/2017 1002   CREATININE 0.85 04/16/2021 1046   CREATININE 0.81 04/06/2020 1059   CREATININE 0.8 05/29/2017 1002      Component Value Date/Time   CALCIUM 10.0 04/16/2021 1046   CALCIUM 9.4 05/29/2017 1002   ALKPHOS 47 04/16/2021 1046   ALKPHOS 39 (L) 05/29/2017 1002   AST 20 04/16/2021 1046   AST 18 05/29/2017 1002   ALT 21 04/16/2021 1046   ALT 19 05/29/2017 1002   BILITOT 0.7 04/16/2021 1046   BILITOT 0.48 05/29/2017 1002       Impression and Plan: [name] is a very pleasant 70 yo caucasian female with recent diagnosis of hemochromatosis, heterozygous for the H63D mutation. Iron studies are pending. We will set her up to donate with [facility] if needed.  Lab check in 6 weeks. Follow-up in 3 months.  She can contact our office with any questions or concerns.   Lottie Dawson, NP 10/28/202211:44  AM

## 2021-06-07 LAB — FERRITIN: Ferritin: 52 ng/mL (ref 11–307)

## 2021-06-07 LAB — IRON AND TIBC
Iron: 88 ug/dL (ref 41–142)
Saturation Ratios: 24 % (ref 21–57)
TIBC: 373 ug/dL (ref 236–444)
UIBC: 285 ug/dL (ref 120–384)

## 2021-06-07 MED ORDER — OZEMPIC (2 MG/DOSE) 8 MG/3ML ~~LOC~~ SOPN: 2.0000 mg | PEN_INJECTOR | SUBCUTANEOUS | 2 refills | Status: DC

## 2021-06-07 NOTE — Progress Notes (Signed)
Chief Complaint:   OBESITY Laura Bush is here to discuss her progress with her obesity treatment plan along with follow-up of her obesity related diagnoses. See Medical Weight Management Flowsheet for complete bioelectrical impedance results.  Today's visit was #: 20 Starting weight: 196 lbs Starting date: 10/17/2019 Weight change since last visit: +1 lb Total lbs lost to date: 30 lbs Total weight loss percentage to date: -15.31%  Nutrition Plan: [place] + chicken for 90% of the time. Activity: Yoga for 60 minutes 2 times per week. Anti-obesity medications: Ozempic 1 mg subcutaneously weekly. Reported side effects: None.  Interim History: Laura Bush is doing well.  She endorses some polyphagia.  Assessment/Plan:   1. Impaired fasting glucose, with polyphagia Not at goal. Current treatment: Ozempic 1 mg subcutaneously weekly.    Plan:  Increase Ozempic to 2 mg subcutaneously weekly.  She will continue to focus on protein-rich, low simple carbohydrate foods. We reviewed the importance of hydration, regular exercise for stress reduction, and restorative sleep.  - Increase Semaglutide, 2 MG/DOSE, (OZEMPIC, 2 MG/DOSE,) 8 MG/3ML SOPN; Inject 2 mg into the skin once a week.  Dispense: 3 mL; Refill: 2  2. Mixed hyperlipidemia Course: Not at goal. Lipid-lowering medications: None.   Plan: Dietary changes: Increase soluble fiber, decrease simple carbohydrates, decrease saturated fat. Exercise changes: Moderate to vigorous-intensity aerobic activity 150 minutes per week or as tolerated. We will continue to monitor along with PCP/specialists as it pertains to her weight loss journey.  Lab Results  Component Value Date   CHOL 156 10/05/2020   HDL 54.30 10/05/2020   LDLCALC 66 10/05/2020   TRIG 182.0 (H) 10/05/2020   CHOLHDL 3 10/05/2020   Lab Results  Component Value Date   ALT 21 06/04/2021   AST 19 06/04/2021   ALKPHOS 52 06/04/2021   BILITOT 0.6 06/04/2021   3. Essential  hypertension At goal. Medications: amiloride 5 mg daily, Coreg 6.25 mg twice daily, HCTZ 12.5 mg daily, telmisartan 80 mg daily.   Plan: Avoid buying foods that are: processed, frozen, or prepackaged to avoid excess salt. We will watch for signs of hypotension as she continues lifestyle modifications.  BP Readings from Last 3 Encounters:  06/04/21 123/66  06/02/21 110/70  05/13/21 128/79   Lab Results  Component Value Date   CREATININE 0.89 06/04/2021   4. Obesity, current BMI 33.5  Course: Laura Bush is currently in the action stage of change. As such, her goal is to continue with weight loss efforts.   Nutrition goals: She has agreed to the [place] + chicken.   Exercise goals:  As is.  Behavioral modification strategies: increasing lean protein intake, decreasing simple carbohydrates, increasing vegetables, increasing water intake, and emotional eating strategies.  Laura Bush has agreed to follow-up with our clinic in 4 weeks. She was informed of the importance of frequent follow-up visits to maximize her success with intensive lifestyle modifications for her multiple health conditions.   Objective:   Blood pressure 110/70, pulse 61, temperature 97.9 F (36.6 C), temperature source Oral, height 4\' 11"  (1.499 m), weight 166 lb (75.3 kg), SpO2 98 %. Body mass index is 33.53 kg/m.  General: Cooperative, alert, well developed, in no acute distress. HEENT: Conjunctivae and lids unremarkable. Cardiovascular: Regular rhythm.  Lungs: Normal work of breathing. Neurologic: No focal deficits.   Lab Results  Component Value Date   CREATININE 0.89 06/04/2021   BUN 19 06/04/2021   NA 139 06/04/2021   K 4.5 06/04/2021   CL  103 06/04/2021   CO2 30 06/04/2021   Lab Results  Component Value Date   ALT 21 06/04/2021   AST 19 06/04/2021   ALKPHOS 52 06/04/2021   BILITOT 0.6 06/04/2021   Lab Results  Component Value Date   HGBA1C 5.2 04/06/2020   HGBA1C 5.6 04/03/2019    HGBA1C 5.6 03/28/2018   Lab Results  Component Value Date   INSULIN 12.6 10/17/2019   Lab Results  Component Value Date   TSH 0.88 10/05/2020   Lab Results  Component Value Date   CHOL 156 10/05/2020   HDL 54.30 10/05/2020   LDLCALC 66 10/05/2020   TRIG 182.0 (H) 10/05/2020   CHOLHDL 3 10/05/2020   Lab Results  Component Value Date   VD25OH 44.11 10/05/2020   VD25OH 38.52 04/03/2019   VD25OH 43.05 03/22/2017   Lab Results  Component Value Date   WBC 4.5 06/04/2021   HGB 13.1 06/04/2021   HCT 38.3 06/04/2021   MCV 95.8 06/04/2021   PLT 200 06/04/2021   Lab Results  Component Value Date   IRON 88 06/04/2021   TIBC 373 06/04/2021   FERRITIN 52 06/04/2021   Attestation Statements:   Reviewed by clinician on day of visit: allergies, medications, problem list, medical history, surgical history, family history, social history, and previous encounter notes.  I, Water quality scientist, CMA, am acting as transcriptionist for Briscoe Deutscher, DO  I have reviewed the above documentation for accuracy and completeness, and I agree with the above. -  Briscoe Deutscher, DO, MS, FAAFP, DABOM - Family and Bariatric Medicine.

## 2021-06-08 DIAGNOSIS — Z08 Encounter for follow-up examination after completed treatment for malignant neoplasm: Secondary | ICD-10-CM | POA: Diagnosis not present

## 2021-06-08 DIAGNOSIS — D225 Melanocytic nevi of trunk: Secondary | ICD-10-CM | POA: Diagnosis not present

## 2021-06-08 DIAGNOSIS — L298 Other pruritus: Secondary | ICD-10-CM | POA: Diagnosis not present

## 2021-06-08 DIAGNOSIS — L538 Other specified erythematous conditions: Secondary | ICD-10-CM | POA: Diagnosis not present

## 2021-06-08 DIAGNOSIS — L570 Actinic keratosis: Secondary | ICD-10-CM | POA: Diagnosis not present

## 2021-06-08 DIAGNOSIS — L814 Other melanin hyperpigmentation: Secondary | ICD-10-CM | POA: Diagnosis not present

## 2021-06-08 DIAGNOSIS — Z85828 Personal history of other malignant neoplasm of skin: Secondary | ICD-10-CM | POA: Diagnosis not present

## 2021-06-08 DIAGNOSIS — D2272 Melanocytic nevi of left lower limb, including hip: Secondary | ICD-10-CM | POA: Diagnosis not present

## 2021-06-08 DIAGNOSIS — L821 Other seborrheic keratosis: Secondary | ICD-10-CM | POA: Diagnosis not present

## 2021-06-08 DIAGNOSIS — L820 Inflamed seborrheic keratosis: Secondary | ICD-10-CM | POA: Diagnosis not present

## 2021-06-09 ENCOUNTER — Other Ambulatory Visit: Payer: Self-pay | Admitting: Family Medicine

## 2021-06-09 MED ORDER — HYDROCHLOROTHIAZIDE 12.5 MG PO CAPS: ORAL_CAPSULE | ORAL | 0 refills | Status: DC

## 2021-06-17 ENCOUNTER — Encounter (INDEPENDENT_AMBULATORY_CARE_PROVIDER_SITE_OTHER): Payer: Self-pay | Admitting: Family Medicine

## 2021-06-17 DIAGNOSIS — R7301 Impaired fasting glucose: Secondary | ICD-10-CM

## 2021-06-17 MED ORDER — OZEMPIC (1 MG/DOSE) 4 MG/3ML ~~LOC~~ SOPN: 1.0000 mg | PEN_INJECTOR | SUBCUTANEOUS | 0 refills | Status: DC

## 2021-06-17 NOTE — Telephone Encounter (Signed)
Pt last seen by Dr. Wallace.  

## 2021-06-27 DIAGNOSIS — G4733 Obstructive sleep apnea (adult) (pediatric): Secondary | ICD-10-CM | POA: Diagnosis not present

## 2021-07-09 ENCOUNTER — Telehealth: Payer: Self-pay | Admitting: Pharmacist

## 2021-07-09 NOTE — Chronic Care Management (AMB) (Signed)
    Chronic Care Management Pharmacy Assistant   Name: Laura Bush  MRN: [id number] DOB: 1951/07/28  Reason for Encounter: General  Recent office visits:  None noted  Recent consult visits:  06/04/21 (Oncology)-Sarah Kandis Cocking, NP. General follow up visit. Lab check in 6 weeks. Follow up in 3 months. 06/02/21(Weight Management center) Wendie Simmer, DO. Follow up for Obesity.  Increase Ozempic to 2 mg subcutaneously weekly. Follow up in 4 weeks. 05/13/21 (Neurology)- Star Age, MD. General follow up visit.  Hospital visits:  None in previous 6 months  Medications: Outpatient Encounter Medications as of 07/09/2021  Medication Sig   ACETAMINOPHEN PO Take 650 mg by mouth 2 (two) times daily as needed (for shoulder and other mild to moderate pain).   aMILoride (MIDAMOR) 5 MG tablet Take 1 tablet (5 mg total) by mouth daily.   aspirin EC 81 MG tablet Take 81 mg by mouth daily. Swallow whole.   B Complex-C (B-COMPLEX WITH VITAMIN C) tablet Take 1 tablet by mouth daily.   CALCIUM CARBONATE-VITAMIN D PO Take 1 tablet by mouth 2 (two) times daily. 600mg  calcium, D3 (1000)IU   carvedilol (COREG) 6.25 MG tablet Take 1 tablet (6.25 mg total) by mouth 2 (two) times daily with a meal.   Elderberry 575 MG/5ML SYRP Take by mouth daily.   Fexofenadine HCl (ALLEGRA PO) Take 1 tablet by mouth daily as needed.   hydrochlorothiazide (MICROZIDE) 12.5 MG capsule TAKE 1 CAPSULE BY MOUTH EVERY DAY   ibuprofen (ADVIL) 200 MG tablet Take 400 mg by mouth daily as needed (for pain not relieved by Tylenol / acetaminophen).   influenza vaccine adjuvanted (FLUAD) 0.5 ML injection Inject into the muscle.   ipratropium (ATROVENT) 0.06 % nasal spray Place 2 sprays into both nostrils 4 (four) times daily. Use as needed   pantoprazole (PROTONIX) 40 MG tablet Take 1 tablet (40 mg total) by mouth daily.   Semaglutide, 1 MG/DOSE, (OZEMPIC, 1 MG/DOSE,) 4 MG/3ML SOPN Inject 1 mg into the skin 2 (two) times a week.    simvastatin (ZOCOR) 20 MG tablet Take 1 tablet (20 mg total) by mouth daily.   telmisartan (MICARDIS) 80 MG tablet Take 1 tablet (80 mg total) by mouth daily.   No facility-administered encounter medications on file as of 07/09/2021.   Have you had any problems recently with your health? Patient states she has been doing well and has no concerns with her health.  Have you had any problems with your pharmacy? Patient states she has no problems with her pharmacy.  What issues or side effects are you having with your medications? Patient states she has no issues or side effects to her medications.  What would you like me to pass along to [place] for them to help you with?  Patient states she has been doing well Ozempic she is taking miralax for the constipation issue.  What can we do to take care of you better? Patient states there is nothing at this time.  Star Rating Drugs: Ozempic Last filled:06/17/21 28 DS Simvastatin 20 mg Last filled:06/25/21 90 DS Telmisartan 80 mg Last filled:06/26/21 90 DS  Myriam Elta [place], [place]

## 2021-07-13 ENCOUNTER — Encounter (INDEPENDENT_AMBULATORY_CARE_PROVIDER_SITE_OTHER): Payer: Self-pay

## 2021-07-13 ENCOUNTER — Telehealth (INDEPENDENT_AMBULATORY_CARE_PROVIDER_SITE_OTHER): Payer: Medicare HMO | Admitting: Family Medicine

## 2021-07-13 NOTE — Progress Notes (Signed)
Laura Bush at [facility] [address] [place], [place] [address] [phone]  Date:  07/14/2021   Name:  Laura Bush   DOB:  1951-05-30   MRN:  [id number]  PCP:  Laura Mclean, MD    Chief Complaint: 3-4 month follow up (Concerns/ questions: 1.pt needs refills, 2. Pt says she had some disorientation last week. 3. Possible Raynaud's Disease./Flu shot: Done 04/2021/)   History of Present Illness:  Laura Bush is a 70 y.o. very pleasant female patient who presents with the following:  Pt seen today for periodic follow-up visit Last seen by myself in August - PMH includes hypertension, venous insufficiency, GERD, hypercholesterolemia, and morbid obesity,  breast cancer diagnosed 2016 followed by oncology, hemochromatosis being managed by hematology   She had some brain MRI abnl earlier this year and has been to see neurology- their findings were overall reassuring. Last neurology visit in October - they are also monitoring her CPAP use   Seen by Laura Bush for her [facility] also in October: Impression and Plan: [name] is a very pleasant 70 yo caucasian female with recent diagnosis of hemochromatosis, heterozygous for the H63D mutation. Iron studies are pending. We will set her up to donate with [facility] if needed.  Lab check in 6 weeks. Follow-up in 3 months  Colonoscopy last year - 5 year follow-up  They were in [place] last week- she notes that when they arrived in [place] at about 9 PM she was feeling dizzy and disoriented.  It lasted for perhaps 2 hours into they checked into their air B&B She felt fine the next day and has felt fine since No other neurologic sx such as numbness or weakness  She gets Raynaud's syndrome, her symptoms are mostly color change - white/ yellow in her hand and now in the left foot as well No pain noted but it can be uncomfortable Putting her hands under warm water will help She does  not wish to use any medication for this right now   She did a skin treatment with her derm- a chemical peel- which is healing up ok   She is being prescribed Ozempic by the weight and wellness center, she has had very good results with weight loss.  She is quite pleased Patient Active Problem List   Diagnosis Date Noted   Hemochromatosis 01/21/2021   Obesity (BMI 30-39.9) 27/78/2423   Metabolic syndrome 53/61/4431   Mixed hyperlipidemia 04/21/2020   Polyp of ascending colon 04/21/2020   History of breast cancer, Tamoxifen, nearing five years, Dr. Griffith Bush 04/21/2020   Osteopenia 10/02/2018   Malignant 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, estrogen receptor positive ([place]) 05/29/2017   Ductal carcinoma in situ (DCIS) of right breast 05/29/2017   Vitamin D deficiency 01/29/2016   Eczema 12/29/2014   Venous insufficiency 10/16/2014   Essential hypertension 10/01/2014   Hypercholesteremia 10/01/2014   GERD (gastroesophageal reflux disease) 10/01/2014   Other bilateral bundle branch block 06/23/2003    Past Medical History:  Diagnosis Date   Asthma    in cold weather   Breast cancer ([place]) 09/18/14   left breast   Breast cancer ([place]) 10/07/14   bx left breast   Breast cancer of upper-outer quadrant of left female breast ([place]) 09/22/2014   Cluster headaches    in her 40's   GERD (gastroesophageal reflux disease)    Heart murmur    as  a child (has outgrown)   High cholesterol    Hypertension    Lower extremity edema    Microscopic colitis    Morbid obesity ([place]) 09/30/2019   Obesity (BMI 30-39.9) 11/11/2020   OSA on CPAP    Personal history of radiation therapy    Pneumonia 2000's X 1   PONV (postoperative nausea and vomiting)    Rosacea    S/P radiation therapy 12/30/14-01/27/15   left breast 50Gy total dose   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"   Swallowing difficulty    Venous insufficiency     Past Surgical History:  Procedure Laterality Date   ABDOMINAL  HYSTERECTOMY  1999   APPENDECTOMY  ~ 2006   BREAST BIOPSY Left 10/2014   BREAST LUMPECTOMY Left 2016   BREAST REDUCTION SURGERY Bilateral 11/11/2014   Procedure: Bilateral Breast Reduction;  Surgeon: Laura Reese, MD;  Location: [facility];  Service: Plastics;  Laterality: Bilateral;   CARPAL TUNNEL RELEASE Bilateral    2 surgeries on right, 1 on left   CESAREAN SECTION  1978; [place] Right 2003 X 3   KNEE ARTHROSCOPY Bilateral    meniscus repair   PARTIAL MASTECTOMY WITH NEEDLE LOCALIZATION AND AXILLARY SENTINEL LYMPH NODE BX Left 11/11/2014   Procedure: LEFT BREAST PARTIAL MASTECTOMY WITH NEEDLE LOCALIZATION TIMES TWO AND LEFT AXILLARY SENTINEL LYMPH NODE Biopsy;  Surgeon: Laura Skates, MD;  Location: [facility];  Service: General;  Laterality: Left;   SQUAMOUS CELL CARCINOMA EXCISION  1980's X 1   "nose"   TONSILLECTOMY  ~ 1957   TUBAL LIGATION  ?1984    Social History   Tobacco Use   Smoking status: Never   Smokeless tobacco: Never  Vaping Use   Vaping Use: Never used  Substance Use Topics   Alcohol use: Yes    Alcohol/week: 2.0 standard drinks    Types: 2 Glasses of wine per week    Comment: 2 glasses a week   Drug use: No    Family History  Problem Relation Age of Onset   Dementia Mother        Lives in [place]   Hypertension Mother    Hyperlipidemia Mother    Stroke Father        deceased   Heart failure Father    Hypertension Father    Uterine cancer Sister 61   Heart attack Maternal Grandmother    Hypertension Maternal Grandfather    Stroke Paternal Grandfather    Colon cancer Neg Hx    Esophageal cancer Neg Hx    Pancreatic cancer Neg Hx    Stomach cancer Neg Hx    Liver disease Neg Hx    Sleep apnea Neg Hx     Allergies  Allergen Reactions   Diclofenac Hypertension   Amlodipine Swelling   Lanolin    Tape Other (See Comments)    Redness (Bandaids also)    Medication list has been reviewed and updated.  Current  Outpatient Medications on File Prior to Visit  Medication Sig Dispense Refill   ACETAMINOPHEN PO Take 650 mg by mouth 2 (two) times daily as needed (for shoulder and other mild to moderate pain).     aMILoride (MIDAMOR) 5 MG tablet Take 1 tablet (5 mg total) by mouth daily. 90 tablet 3   aspirin EC 81 MG tablet Take 81 mg by mouth daily. Swallow whole.     B Complex-C (B-COMPLEX WITH VITAMIN C)  tablet Take 1 tablet by mouth daily.     CALCIUM CARBONATE-VITAMIN D PO Take 1 tablet by mouth 2 (two) times daily. 600mg  calcium, D3 (1000)IU     Elderberry 575 MG/5ML SYRP Take by mouth daily.     Fexofenadine HCl (ALLEGRA PO) Take 1 tablet by mouth daily as needed.     Fluorouracil (TOLAK) 4 % CREA Apply topically.     ibuprofen (ADVIL) 200 MG tablet Take 400 mg by mouth daily as needed (for pain not relieved by Tylenol / acetaminophen).     influenza vaccine adjuvanted (FLUAD) 0.5 ML injection Inject into the muscle. 0.5 mL 0   ipratropium (ATROVENT) 0.06 % nasal spray Place 2 sprays into both nostrils 4 (four) times daily. Use as needed 15 mL 4   Semaglutide, 1 MG/DOSE, (OZEMPIC, 1 MG/DOSE,) 4 MG/3ML SOPN Inject 1 mg into the skin 2 (two) times a week. 6 mL 0   No current facility-administered medications on file prior to visit.    Review of Systems:  As per HPI- otherwise negative.  Physical Examination: Vitals:   07/14/21 1543  BP: 122/80  Pulse: 68  Resp: 18  Temp: 98.4 F (36.9 C)  SpO2: 98%   Vitals:   07/14/21 1543  Weight: 166 lb (75.3 kg)  Height: 4\' 11"  (1.499 m)   Body mass index is 33.53 kg/m. Ideal Body Weight: Weight in (lb) to have BMI = 25: 123.5  GEN: no acute distress.  Mild obesity, looks well HEENT: Atraumatic, Normocephalic.  Bilateral TM wnl, oropharynx normal.  PEERL,EOMI.   Ears and Nose: No external deformity. CV: RRR, No M/G/R. No JVD. No thrill. No extra heart sounds. PULM: CTA B, no wheezes, crackles, rhonchi. No retractions. No resp. distress. No  accessory muscle use. ABD: S, NT, ND, +BS. No rebound. No HSM. EXTR: No c/c/e PSYCH: Normally interactive. Conversant.  She has lost about 35 lbs through the weight and wellness center   Wt Readings from Last 3 Encounters:  07/14/21 166 lb (75.3 kg)  06/04/21 167 lb (75.8 kg)  06/02/21 166 lb (75.3 kg)    Assessment and Plan: Gastroesophageal reflux disease, unspecified whether esophagitis present - Plan: pantoprazole (PROTONIX) 40 MG tablet  Essential hypertension - Plan: carvedilol (COREG) 6.25 MG tablet, hydrochlorothiazide (MICROZIDE) 12.5 MG capsule, telmisartan (MICARDIS) 80 MG tablet  Mixed hyperlipidemia - Plan: simvastatin (ZOCOR) 20 MG tablet  Metabolic syndrome  Raynaud's disease without gangrene  Patient seen today for follow-up.  She is using Ozempic and has made great progress with weight loss.  Celebrated her progress, for the time being continue current cholesterol and lipid medications  Refilled pantoprazole for GERD  We discussed strategies for dealing with her Raynaud's syndrome especially during the cold weather  Discussed episode of disorientation which occurred about a week ago after an Biomedical engineer.  She returned to normal within a couple of hours and has felt well since.  She also recently had a full neurologic evaluation including MRI brain with and without contrast.  Explained that I do not have a definite explanation for this episode.  Certainly may have been caused by increased [place] busy airport and a change in her typical routine.  Patient feels comfortable with observation, she will let me know if this happens again  Signed Lamar Blinks, MD

## 2021-07-14 ENCOUNTER — Ambulatory Visit (INDEPENDENT_AMBULATORY_CARE_PROVIDER_SITE_OTHER): Payer: Medicare HMO | Admitting: Family Medicine

## 2021-07-14 ENCOUNTER — Ambulatory Visit: Payer: Medicare HMO | Admitting: Family Medicine

## 2021-07-14 ENCOUNTER — Telehealth: Payer: Self-pay | Admitting: Family Medicine

## 2021-07-14 VITALS — BP 122/80 | HR 68 | Temp 98.4°F | Resp 18 | Ht 59.0 in | Wt 166.0 lb

## 2021-07-14 DIAGNOSIS — K219 Gastro-esophageal reflux disease without esophagitis: Secondary | ICD-10-CM | POA: Diagnosis not present

## 2021-07-14 DIAGNOSIS — E8881 Metabolic syndrome: Secondary | ICD-10-CM

## 2021-07-14 DIAGNOSIS — I7300 Raynaud's syndrome without gangrene: Secondary | ICD-10-CM | POA: Diagnosis not present

## 2021-07-14 DIAGNOSIS — E782 Mixed hyperlipidemia: Secondary | ICD-10-CM | POA: Diagnosis not present

## 2021-07-14 DIAGNOSIS — I10 Essential (primary) hypertension: Secondary | ICD-10-CM | POA: Diagnosis not present

## 2021-07-14 MED ORDER — CARVEDILOL 6.25 MG PO TABS: 6.2500 mg | ORAL_TABLET | Freq: Two times a day (BID) | ORAL | 3 refills | Status: DC

## 2021-07-14 MED ORDER — TELMISARTAN 80 MG PO TABS: 80.0000 mg | ORAL_TABLET | Freq: Every day | ORAL | 3 refills | Status: DC

## 2021-07-14 MED ORDER — HYDROCHLOROTHIAZIDE 12.5 MG PO CAPS: ORAL_CAPSULE | ORAL | 3 refills | Status: DC

## 2021-07-14 MED ORDER — PANTOPRAZOLE SODIUM 40 MG PO TBEC: 40.0000 mg | DELAYED_RELEASE_TABLET | Freq: Every day | ORAL | 3 refills | Status: DC

## 2021-07-14 MED ORDER — SIMVASTATIN 20 MG PO TABS: 20.0000 mg | ORAL_TABLET | Freq: Every day | ORAL | 3 refills | Status: DC

## 2021-07-14 NOTE — Patient Instructions (Addendum)
The best way to keep your Raynaud's under control is to keep your hands and feet warm.  If you do want to try some medication for it please let me know!   Medication are refilled for a year Assuming all is well please see me in about 6 months

## 2021-07-14 NOTE — Telephone Encounter (Signed)
We are able to pull report on resmed airview. Recent DL below. She has initial cpap f/u w/ AL,NP 07/20/21.

## 2021-07-14 NOTE — Telephone Encounter (Signed)
Pt is wanting to make sure we are able to see her CPAP info online, she is requesting a call back from the nurse if we are not able to see her information. Please advise.

## 2021-07-16 ENCOUNTER — Other Ambulatory Visit: Payer: Self-pay

## 2021-07-16 ENCOUNTER — Inpatient Hospital Stay: Payer: Medicare HMO | Attending: Family

## 2021-07-16 LAB — CBC WITH DIFFERENTIAL (CANCER CENTER ONLY)
Abs Immature Granulocytes: 0.01 10*3/uL (ref 0.00–0.07)
Basophils Absolute: 0 10*3/uL (ref 0.0–0.1)
Basophils Relative: 1 %
Eosinophils Absolute: 0.2 10*3/uL (ref 0.0–0.5)
Eosinophils Relative: 5 %
HCT: 37.1 % (ref 36.0–46.0)
Hemoglobin: 13 g/dL (ref 12.0–15.0)
Immature Granulocytes: 0 %
Lymphocytes Relative: 26 %
Lymphs Abs: 1 10*3/uL (ref 0.7–4.0)
MCH: 33.4 pg (ref 26.0–34.0)
MCHC: 35 g/dL (ref 30.0–36.0)
MCV: 95.4 fL (ref 80.0–100.0)
Monocytes Absolute: 0.5 10*3/uL (ref 0.1–1.0)
Monocytes Relative: 13 %
Neutro Abs: 2.2 10*3/uL (ref 1.7–7.7)
Neutrophils Relative %: 55 %
Platelet Count: 193 10*3/uL (ref 150–400)
RBC: 3.89 MIL/uL (ref 3.87–5.11)
RDW: 11.7 % (ref 11.5–15.5)
WBC Count: 4 10*3/uL (ref 4.0–10.5)
nRBC: 0 % (ref 0.0–0.2)

## 2021-07-16 LAB — CMP (CANCER CENTER ONLY)
ALT: 23 U/L (ref 0–44)
AST: 20 U/L (ref 15–41)
Albumin: 4.2 g/dL (ref 3.5–5.0)
Alkaline Phosphatase: 56 U/L (ref 38–126)
Anion gap: 7 (ref 5–15)
BUN: 18 mg/dL (ref 8–23)
CO2: 30 mmol/L (ref 22–32)
Calcium: 10.2 mg/dL (ref 8.9–10.3)
Chloride: 102 mmol/L (ref 98–111)
Creatinine: 0.84 mg/dL (ref 0.44–1.00)
GFR, Estimated: >60 mL/min (ref >60)
Glucose, Bld: 99 mg/dL (ref 70–99)
Potassium: 4.2 mmol/L (ref 3.5–5.1)
Sodium: 139 mmol/L (ref 135–145)
Total Bilirubin: 0.6 mg/dL (ref 0.3–1.2)
Total Protein: 6.7 g/dL (ref 6.5–8.1)

## 2021-07-19 LAB — IRON AND TIBC
Iron: 100 ug/dL (ref 41–142)
Saturation Ratios: 29 % (ref 21–57)
TIBC: 351 ug/dL (ref 236–444)
UIBC: 251 ug/dL (ref 120–384)

## 2021-07-19 LAB — FERRITIN: Ferritin: 63 ng/mL (ref 11–307)

## 2021-07-19 NOTE — Progress Notes (Signed)
PATIENT: Laura Bush DOB: [birth year]  REASON FOR VISIT: follow up HISTORY FROM: patient  Virtual Visit via Telephone Note  I connected with Laura Bush on 07/19/21 at  8:00 AM EST by telephone and verified that I am speaking with the correct person using two identifiers.   I discussed the limitations, risks, security and privacy concerns of performing an evaluation and management service by telephone and the availability of in person appointments. I also discussed with the patient that there may be a patient responsible charge related to this service. The patient expressed understanding and agreed to proceed.   History of Present Illness:  07/19/21 ALL: Laura Bush is a 70 y.o. female here today for follow up for OSA on CPAP. She received her new CPAP machine 05/27/2021. She is doing great. She has gotten adjusted to her new machine. She denies concerns with machine or mask. She is resting well most night. She wakes feeling refreshed.   Facial weakness has improved. She is no longer biting her gum.  She continues simvastatin 20mg  and asa 81mg  daily. LDL was 66 in 09/2020. She is followed by PCP closely.     History (copied from Dr Guadelupe Sabin previous note)  [name] is a 70 year old female with an underlying medical history of hypertension, hyperlipidemia, heart murmur, history of cluster headaches, reflux disease, breast cancer, status post radiation therapy and surgery, on tamoxifen, asthma, history of rosacea, history of venous insufficiency and pneumonia, hemochromatosis, and obesity, who presents for follow-up consultation of her sleep apnea as well as her history of facial droop.  I saw her in July for concern for facial weakness.  This was noted by her dentist on the left side.  She did not have any strokelike symptoms, we decided to proceed with additional testing including echocardiogram, carotid Doppler, brain MRI with and without contrast.   She had a  brain MRI with and without contrast on 03/22/2021 and I reviewed the results: IMPRESSION: This MRI of the brain with and without contrast shows the following: 1.   Scattered T2/FLAIR hyperintense foci in the hemispheres and pons consistent with mild chronic microvascular ischemic change.  A small focus in the right lateral thalamus could represent either an expanded Virchow-Robin space or a chronic lacunar infarction.  None of the foci appear to be acute.  They were all present on the MRI dated 01/23/2021. 2.   Small mastoid effusion on the right.  This could be incidental or due to eustachian tube dysfunction. 3.   No acute findings.  Normal enhancement pattern.   We called her with her test results.   She had an echocardiogram on 03/03/2021 and results were benign.  In particular, her EF was 60 to 65%, normal left ventricular motion.  Mild concentric left ventricular hypertrophy was noted, no valvular abnormalities, aortic valve was tricuspid.   She had a carotid Doppler ultrasound on 03/03/2021 which showed less than 40% stenosis in bilateral ICAs and vertebral arteries showed antegrade flow.   Today, 05/13/2021: I reviewed her AutoPap compliance data from 04/13/2021 through 05/12/2021, which is a total of 30 days, during which time she used her machine every night with percent used days greater than 4 hours at 90%, indicating excellent compliance with an average usage of 7 hours and 22 minutes, residual AHI at 0.7/h, 95th percentile of pressure at 11.9 cm, leak on the low side with a 95th percentile at 6.8 L/min on a pressure of 5 cm to 15  cm with EPR of 3.  She reports she is still waiting for her new machine, her current AutoPap machine is older and has started making abnormal noises including whistling noises and high-pitched abnormal noises which are bothersome and sometimes she just takes off her mask in the middle of the night when she cannot stand the abnormal noise.  It is not every night.  She gets  an update from her DME company about the status of her AutoPap but does not have an actual set up date yet.  She is doing well, no additional neurological symptoms, has been on Ozempic for weight loss.  Her husband has recently retired and they have a celebration for him tonight.     Observations/Objective:  Generalized: Well developed, in no acute distress  Mentation: Alert oriented to time, place, history taking. Follows all commands speech and language fluent. Very slight left facial droop noted, symmetric smile.    Assessment and Plan:  70 y.o. year old female  has a past medical history of Asthma, Breast cancer ([place]) (09/18/14), Breast cancer ([place]) (10/07/14), Breast cancer of upper-outer quadrant of left female breast ([place]) (09/22/2014), Cluster headaches, GERD (gastroesophageal reflux disease), Heart murmur, High cholesterol, Hypertension, Lower extremity edema, Microscopic colitis, Morbid obesity ([place]) (09/30/2019), Obesity (BMI 30-39.9) (11/11/2020), OSA on CPAP, Personal history of radiation therapy, Pneumonia (2000's X 1), PONV (postoperative nausea and vomiting), Rosacea, S/P radiation therapy (12/30/14-01/27/15), Squamous 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 here with    ICD-10-CM   1. OSA on CPAP  G47.33    Z99.89     2. Lacunar stroke ([place])  I63.81       Reegan is doing well with her new CPAP machine. Compliance report shows excellent daily and four hour compliance. She was encouraged to continue using CPAP nightly for at least 4 hours. She will continue close follow up with PCP for stroke prevention. Healthy lifestyle habits encouraged. She will follow up with me in 1 year, sooner if needed.   No orders of the defined types were placed in this encounter.   No orders of the defined types were placed in this encounter.    Follow Up Instructions:  I discussed the assessment and treatment plan with the patient. The patient was provided an opportunity  to ask questions and all were answered. The patient agreed with the plan and demonstrated an understanding of the instructions.   The patient was advised to call back or seek an in-person evaluation if the symptoms worsen or if the condition fails to improve as anticipated.  I provided 15 minutes of non-face-to-face time during this encounter. Patient located at their place of residence during [place] visit. Provider is in the office.    Debbora Presto, NP

## 2021-07-19 NOTE — Patient Instructions (Signed)

## 2021-07-20 ENCOUNTER — Encounter: Payer: Self-pay | Admitting: Family Medicine

## 2021-07-20 ENCOUNTER — Telehealth (INDEPENDENT_AMBULATORY_CARE_PROVIDER_SITE_OTHER): Payer: Medicare HMO | Admitting: Family Medicine

## 2021-07-20 DIAGNOSIS — I6381 Other cerebral infarction due to occlusion or stenosis of small artery: Secondary | ICD-10-CM | POA: Diagnosis not present

## 2021-07-20 DIAGNOSIS — Z9989 Dependence on other enabling machines and devices: Secondary | ICD-10-CM | POA: Diagnosis not present

## 2021-07-20 DIAGNOSIS — G4733 Obstructive sleep apnea (adult) (pediatric): Secondary | ICD-10-CM | POA: Diagnosis not present

## 2021-07-20 DIAGNOSIS — Z0100 Encounter for examination of eyes and vision without abnormal findings: Secondary | ICD-10-CM | POA: Diagnosis not present

## 2021-07-27 DIAGNOSIS — G4733 Obstructive sleep apnea (adult) (pediatric): Secondary | ICD-10-CM | POA: Diagnosis not present

## 2021-07-28 ENCOUNTER — Other Ambulatory Visit (INDEPENDENT_AMBULATORY_CARE_PROVIDER_SITE_OTHER): Payer: Self-pay | Admitting: Family Medicine

## 2021-07-28 DIAGNOSIS — R7301 Impaired fasting glucose: Secondary | ICD-10-CM

## 2021-07-28 MED ORDER — OZEMPIC (1 MG/DOSE) 4 MG/3ML ~~LOC~~ SOPN: 1.0000 mg | PEN_INJECTOR | SUBCUTANEOUS | 0 refills | Status: DC

## 2021-07-28 NOTE — Telephone Encounter (Signed)
Dr.Wallace °

## 2021-07-29 ENCOUNTER — Encounter (INDEPENDENT_AMBULATORY_CARE_PROVIDER_SITE_OTHER): Payer: Self-pay

## 2021-07-30 DIAGNOSIS — G4733 Obstructive sleep apnea (adult) (pediatric): Secondary | ICD-10-CM | POA: Diagnosis not present

## 2021-08-03 ENCOUNTER — Encounter: Payer: Self-pay | Admitting: Family Medicine

## 2021-08-05 ENCOUNTER — Encounter (INDEPENDENT_AMBULATORY_CARE_PROVIDER_SITE_OTHER): Payer: Self-pay | Admitting: Family Medicine

## 2021-08-05 ENCOUNTER — Other Ambulatory Visit: Payer: Self-pay

## 2021-08-05 ENCOUNTER — Ambulatory Visit (INDEPENDENT_AMBULATORY_CARE_PROVIDER_SITE_OTHER): Payer: Medicare HMO | Admitting: Family Medicine

## 2021-08-05 VITALS — BP 126/76 | HR 117 | Temp 97.8°F | Ht 59.0 in | Wt 161.0 lb

## 2021-08-05 DIAGNOSIS — E782 Mixed hyperlipidemia: Secondary | ICD-10-CM | POA: Diagnosis not present

## 2021-08-05 DIAGNOSIS — R7301 Impaired fasting glucose: Secondary | ICD-10-CM

## 2021-08-05 DIAGNOSIS — I10 Essential (primary) hypertension: Secondary | ICD-10-CM | POA: Diagnosis not present

## 2021-08-05 DIAGNOSIS — Z6839 Body mass index (BMI) 39.0-39.9, adult: Secondary | ICD-10-CM

## 2021-08-05 MED ORDER — OZEMPIC (2 MG/DOSE) 8 MG/3ML ~~LOC~~ SOPN: 2.0000 mg | PEN_INJECTOR | SUBCUTANEOUS | 0 refills | Status: DC

## 2021-08-05 NOTE — Progress Notes (Signed)
Chief Complaint:   OBESITY Laura Bush is here to discuss her progress with her obesity treatment plan along with follow-up of her obesity related diagnoses. See Medical Weight Management Flowsheet for complete bioelectrical impedance results.  Today's visit was #: 21 Starting weight: 196 lbs Starting date: 10/17/2019 Weight change since last visit: 5 lbs Total lbs lost to date: 35 lbs Total weight loss percentage to date: -17.86%  Nutrition Plan: Pescatarian +Chicken for 90% of the time. Activity: Yoga. Anti-obesity medications: Ozempic 1 mg subcutaneously twice weekly. Reported side effects: None.  Assessment/Plan:   1. Impaired fasting glucose, with polyphagia Controlled. Current treatment: Ozempic 1 mg subcutaneously twice weekly.    Plan:  Continue Ozempic 2 mg subcutaneously weekly. She will continue to focus on protein-rich, low simple carbohydrate foods. We reviewed the importance of hydration, regular exercise for stress reduction, and restorative sleep.  - Refill Semaglutide, 2 MG/DOSE, (OZEMPIC, 2 MG/DOSE,) 8 MG/3ML SOPN; Inject 2 mg into the skin once a week.  Dispense: 9 mL; Refill: 0  2. Essential hypertension At goal. Medications: amiloride 5 mg daily, carvedilol 6.25 mg twice daily, HCTZ 12.5 mg daily, telmisartan 80 mg daily.   Plan: Avoid buying foods that are: processed, frozen, or prepackaged to avoid excess salt. We will watch for signs of hypotension as she continues lifestyle modifications.  BP Readings from Last 3 Encounters:  08/05/21 126/76  07/14/21 122/80  06/04/21 123/66   Lab Results  Component Value Date   CREATININE 0.84 07/16/2021   3. Mixed hyperlipidemia Course: Not optimized. Lipid-lowering medications: Zocor 20 mg daily.   Plan: Dietary changes: Increase soluble fiber, decrease simple carbohydrates, decrease saturated fat. Exercise changes: Moderate to vigorous-intensity aerobic activity 150 minutes per week or as tolerated. We will  continue to monitor along with PCP/specialists as it pertains to her weight loss journey.  Lab Results  Component Value Date   CHOL 156 10/05/2020   HDL 54.30 10/05/2020   LDLCALC 66 10/05/2020   TRIG 182.0 (H) 10/05/2020   CHOLHDL 3 10/05/2020   Lab Results  Component Value Date   ALT 23 07/16/2021   AST 20 07/16/2021   ALKPHOS 56 07/16/2021   BILITOT 0.6 07/16/2021   4. Obesity, current BMI 32.6  Course: Laura Bush is currently in the action stage of change. As such, her goal is to continue with weight loss efforts.   Nutrition goals: She has agreed to the [facility] +Chicken.   Exercise goals:  As is.  Behavioral modification strategies: increasing lean protein intake, decreasing simple carbohydrates, increasing vegetables, and increasing water intake.  Laura Bush has agreed to follow-up with our clinic in 4 weeks. She was informed of the importance of frequent follow-up visits to maximize her success with intensive lifestyle modifications for her multiple health conditions.   Objective:   Blood pressure 126/76, pulse (!) 117, temperature 97.8 F (36.6 C), temperature source Oral, height 4\' 11"  (1.499 m), weight 161 lb (73 kg), SpO2 100 %. Body mass index is 32.52 kg/m.  General: Cooperative, alert, well developed, in no acute distress. HEENT: Conjunctivae and lids unremarkable. Cardiovascular: Regular rhythm.  Lungs: Normal work of breathing. Neurologic: No focal deficits.   Lab Results  Component Value Date   CREATININE 0.84 07/16/2021   BUN 18 07/16/2021   NA 139 07/16/2021   K 4.2 07/16/2021   CL 102 07/16/2021   CO2 30 07/16/2021   Lab Results  Component Value Date   ALT 23 07/16/2021   AST 20  07/16/2021   ALKPHOS 56 07/16/2021   BILITOT 0.6 07/16/2021   Lab Results  Component Value Date   HGBA1C 5.2 04/06/2020   HGBA1C 5.6 04/03/2019   HGBA1C 5.6 03/28/2018   Lab Results  Component Value Date   INSULIN 12.6 10/17/2019   Lab Results   Component Value Date   TSH 0.88 10/05/2020   Lab Results  Component Value Date   CHOL 156 10/05/2020   HDL 54.30 10/05/2020   LDLCALC 66 10/05/2020   TRIG 182.0 (H) 10/05/2020   CHOLHDL 3 10/05/2020   Lab Results  Component Value Date   VD25OH 44.11 10/05/2020   VD25OH 38.52 04/03/2019   VD25OH 43.05 03/22/2017   Lab Results  Component Value Date   WBC 4.0 07/16/2021   HGB 13.0 07/16/2021   HCT 37.1 07/16/2021   MCV 95.4 07/16/2021   PLT 193 07/16/2021   Lab Results  Component Value Date   IRON 100 07/16/2021   TIBC 351 07/16/2021   FERRITIN 63 07/16/2021   Attestation Statements:   Reviewed by clinician on day of visit: allergies, medications, problem list, medical history, surgical history, family history, social history, and previous encounter notes.  I, Water quality scientist, CMA, am acting as transcriptionist for Briscoe Deutscher, DO  I have reviewed the above documentation for accuracy and completeness, and I agree with the above. -  Briscoe Deutscher, DO, MS, FAAFP, DABOM - Family and Bariatric Medicine.

## 2021-08-09 ENCOUNTER — Other Ambulatory Visit (INDEPENDENT_AMBULATORY_CARE_PROVIDER_SITE_OTHER): Payer: Self-pay | Admitting: Family Medicine

## 2021-08-09 DIAGNOSIS — R7301 Impaired fasting glucose: Secondary | ICD-10-CM

## 2021-08-10 NOTE — Telephone Encounter (Signed)
LOV w/ Wallace

## 2021-08-16 ENCOUNTER — Other Ambulatory Visit: Payer: Self-pay | Admitting: Family Medicine

## 2021-08-16 DIAGNOSIS — Z1231 Encounter for screening mammogram for malignant neoplasm of breast: Secondary | ICD-10-CM

## 2021-08-27 DIAGNOSIS — G4733 Obstructive sleep apnea (adult) (pediatric): Secondary | ICD-10-CM | POA: Diagnosis not present

## 2021-08-30 DIAGNOSIS — G4733 Obstructive sleep apnea (adult) (pediatric): Secondary | ICD-10-CM | POA: Diagnosis not present

## 2021-09-01 ENCOUNTER — Ambulatory Visit: Payer: Medicare HMO | Admitting: Family Medicine

## 2021-09-03 ENCOUNTER — Encounter: Payer: Self-pay | Admitting: Family

## 2021-09-03 ENCOUNTER — Telehealth: Payer: Self-pay | Admitting: *Deleted

## 2021-09-03 ENCOUNTER — Inpatient Hospital Stay: Payer: Medicare HMO | Attending: Family

## 2021-09-03 ENCOUNTER — Other Ambulatory Visit: Payer: Self-pay

## 2021-09-03 ENCOUNTER — Inpatient Hospital Stay: Payer: Medicare HMO | Admitting: Family

## 2021-09-03 LAB — CBC WITH DIFFERENTIAL (CANCER CENTER ONLY)
Abs Immature Granulocytes: 0.02 10*3/uL (ref 0.00–0.07)
Basophils Absolute: 0 10*3/uL (ref 0.0–0.1)
Basophils Relative: 1 %
Eosinophils Absolute: 0.2 10*3/uL (ref 0.0–0.5)
Eosinophils Relative: 4 %
HCT: 38.7 % (ref 36.0–46.0)
Hemoglobin: 13.1 g/dL (ref 12.0–15.0)
Immature Granulocytes: 0 %
Lymphocytes Relative: 20 %
Lymphs Abs: 1 10*3/uL (ref 0.7–4.0)
MCH: 32.9 pg (ref 26.0–34.0)
MCHC: 33.9 g/dL (ref 30.0–36.0)
MCV: 97.2 fL (ref 80.0–100.0)
Monocytes Absolute: 0.4 10*3/uL (ref 0.1–1.0)
Monocytes Relative: 8 %
Neutro Abs: 3.3 10*3/uL (ref 1.7–7.7)
Neutrophils Relative %: 67 %
Platelet Count: 190 10*3/uL (ref 150–400)
RBC: 3.98 MIL/uL (ref 3.87–5.11)
RDW: 11.9 % (ref 11.5–15.5)
WBC Count: 5 10*3/uL (ref 4.0–10.5)
nRBC: 0 % (ref 0.0–0.2)

## 2021-09-03 LAB — CMP (CANCER CENTER ONLY)
ALT: 23 U/L (ref 0–44)
AST: 19 U/L (ref 15–41)
Albumin: 4.4 g/dL (ref 3.5–5.0)
Alkaline Phosphatase: 57 U/L (ref 38–126)
Anion gap: 7 (ref 5–15)
BUN: 19 mg/dL (ref 8–23)
CO2: 31 mmol/L (ref 22–32)
Calcium: 10.5 mg/dL — ABNORMAL HIGH (ref 8.9–10.3)
Chloride: 100 mmol/L (ref 98–111)
Creatinine: 0.86 mg/dL (ref 0.44–1.00)
GFR, Estimated: >60 mL/min (ref >60)
Glucose, Bld: 94 mg/dL (ref 70–99)
Potassium: 3.8 mmol/L (ref 3.5–5.1)
Sodium: 138 mmol/L (ref 135–145)
Total Bilirubin: 0.6 mg/dL (ref 0.3–1.2)
Total Protein: 7.1 g/dL (ref 6.5–8.1)

## 2021-09-03 LAB — IRON AND IRON BINDING CAPACITY (CC-WL,HP ONLY)
Iron: 75 ug/dL (ref 28–170)
Saturation Ratios: 17 % (ref 10.4–31.8)
TIBC: 438 ug/dL (ref 250–450)
UIBC: 363 ug/dL (ref 148–442)

## 2021-09-03 LAB — FERRITIN: Ferritin: 39 ng/mL (ref 11–307)

## 2021-09-03 NOTE — Telephone Encounter (Signed)
Per 09/03/21 los gave upcoming appointments - confirmed

## 2021-09-03 NOTE — Progress Notes (Signed)
Hematology and Oncology Follow Up Visit  Laura Bush [id number] August 15, 1950 71 y.o. 09/03/2021   Principle Diagnosis:  Hemochromatosis, heterozygous for the H63D mutation   Current Therapy:        Phlebotomy to maintain iron saturation < 50% and ferritin < 100   Interim History:  Laura Bush is here today for follow-up. She is doing quite well and has no complaints at this time.  Iron studies are pending. So far, her counts have remained stable and she has not needed to donate.  No bruising or petechiae.  No fever, chills, n/v, cough, rash, dizziness, SOB, chest pain, palpitations, abdominal pain or changes in bowel or bladder habits.  No swelling, tenderness, numbness or tingling in her extremities.  No falls or syncope.  She has maintained a good appetite and is staying well hydrated. Her weight is stable at 167 lbs.   ECOG Performance Status: 0 - Asymptomatic  Medications:  Allergies as of 09/03/2021       Reactions   Diclofenac Hypertension   Amlodipine Swelling   Lanolin    Tape Other (See Comments)   Redness (Bandaids also)        Medication List        Accurate as of September 03, 2021 10:44 AM. If you have any questions, ask your nurse or doctor.          ACETAMINOPHEN PO Take 650 mg by mouth 2 (two) times daily as needed (for shoulder and other mild to moderate pain).   ALLEGRA PO Take 1 tablet by mouth daily as needed.   aMILoride 5 MG tablet Commonly known as: MIDAMOR Take 1 tablet (5 mg total) by mouth daily.   aspirin EC 81 MG tablet Take 81 mg by mouth daily. Swallow whole.   B-complex with vitamin C tablet Take 1 tablet by mouth daily.   CALCIUM CARBONATE-VITAMIN D PO Take 1 tablet by mouth 2 (two) times daily. 600mg  calcium, D3 (1000)IU   carvedilol 6.25 MG tablet Commonly known as: COREG Take 1 tablet (6.25 mg total) by mouth 2 (two) times daily with a meal.   Elderberry 575 MG/5ML Syrp Take by mouth daily.   Fluad  Quadrivalent 0.5 ML injection Generic drug: influenza vaccine adjuvanted Inject into the muscle.   hydrochlorothiazide 12.5 MG capsule Commonly known as: MICROZIDE TAKE 1 CAPSULE BY MOUTH EVERY DAY   ibuprofen 200 MG tablet Commonly known as: ADVIL Take 400 mg by mouth daily as needed (for pain not relieved by Tylenol / acetaminophen).   ipratropium 0.06 % nasal spray Commonly known as: ATROVENT Place 2 sprays into both nostrils 4 (four) times daily. Use as needed   Ozempic (2 MG/DOSE) 8 MG/3ML Sopn Generic drug: Semaglutide (2 MG/DOSE) Inject 2 mg into the skin once a week.   pantoprazole 40 MG tablet Commonly known as: Protonix Take 1 tablet (40 mg total) by mouth daily.   simvastatin 20 MG tablet Commonly known as: ZOCOR Take 1 tablet (20 mg total) by mouth daily.   telmisartan 80 MG tablet Commonly known as: MICARDIS Take 1 tablet (80 mg total) by mouth daily.   [place] 4 % Crea Generic drug: Fluorouracil Apply topically.        Allergies:  Allergies  Allergen Reactions   Diclofenac Hypertension   Amlodipine Swelling   Lanolin    Tape Other (See Comments)    Redness (Bandaids also)    Past Medical History, Surgical history, Social history, and Family History were reviewed and  updated.  Review of Systems: All other 10 point review of systems is negative.   Physical Exam:  height is 4\' 11"  (1.499 m) and weight is 167 lb 4 oz (75.9 kg). Her oral temperature is 98.5 F (36.9 C). Her blood pressure is 116/72 and her pulse is 70. Her respiration is 17 and oxygen saturation is 100%.   Wt Readings from Last 3 Encounters:  09/03/21 167 lb 4 oz (75.9 kg)  08/05/21 161 lb (73 kg)  07/14/21 166 lb (75.3 kg)    Ocular: Sclerae unicteric, pupils equal, round and reactive to light Ear-nose-throat: Oropharynx clear, dentition fair Lymphatic: No cervical or supraclavicular adenopathy Lungs no rales or rhonchi, good excursion bilaterally Heart regular rate and  rhythm, no murmur appreciated Abd soft, nontender, positive bowel sounds MSK no focal spinal tenderness, no joint edema Neuro: non-focal, well-oriented, appropriate affect Breasts: Deferred   Lab Results  Component Value Date   WBC 5.0 09/03/2021   HGB 13.1 09/03/2021   HCT 38.7 09/03/2021   MCV 97.2 09/03/2021   PLT 190 09/03/2021   Lab Results  Component Value Date   FERRITIN 63 07/16/2021   IRON 100 07/16/2021   TIBC 351 07/16/2021   UIBC 251 07/16/2021   IRONPCTSAT 29 07/16/2021   Lab Results  Component Value Date   RETICCTPCT 1.5 10/17/2019   RBC 3.98 09/03/2021   No results found for: KPAFRELGTCHN, LAMBDASER, KAPLAMBRATIO No results found for: Kandis Cocking, IGMSERUM No results found for: Odetta Pink, SPEI   Chemistry      Component Value Date/Time   NA 138 09/03/2021 0952   NA 142 05/29/2017 1002   K 3.8 09/03/2021 0952   K 3.9 05/29/2017 1002   CL 100 09/03/2021 0952   CO2 31 09/03/2021 0952   CO2 28 05/29/2017 1002   BUN 19 09/03/2021 0952   BUN 13.5 05/29/2017 1002   CREATININE 0.86 09/03/2021 0952   CREATININE 0.81 04/06/2020 1059   CREATININE 0.8 05/29/2017 1002      Component Value Date/Time   CALCIUM 10.5 (H) 09/03/2021 0952   CALCIUM 9.4 05/29/2017 1002   ALKPHOS 57 09/03/2021 0952   ALKPHOS 39 (L) 05/29/2017 1002   AST 19 09/03/2021 0952   AST 18 05/29/2017 1002   ALT 23 09/03/2021 0952   ALT 19 05/29/2017 1002   BILITOT 0.6 09/03/2021 0952   BILITOT 0.48 05/29/2017 1002       Impression and Plan: Laura Bush is a very pleasant 71 yo caucasian female with recent diagnosis of hemochromatosis, heterozygous for the H63D mutation. Iron studies are pending. We will have her donate with [facility] if needed.  Follow-up in 3 months.   Lottie Dawson, NP 1/27/202310:44 AM

## 2021-09-07 ENCOUNTER — Other Ambulatory Visit (INDEPENDENT_AMBULATORY_CARE_PROVIDER_SITE_OTHER): Payer: Self-pay | Admitting: Family Medicine

## 2021-09-07 DIAGNOSIS — R7301 Impaired fasting glucose: Secondary | ICD-10-CM

## 2021-09-07 NOTE — Telephone Encounter (Signed)
Dr.Wallace °

## 2021-09-08 DIAGNOSIS — L218 Other seborrheic dermatitis: Secondary | ICD-10-CM | POA: Diagnosis not present

## 2021-09-08 DIAGNOSIS — L570 Actinic keratosis: Secondary | ICD-10-CM | POA: Diagnosis not present

## 2021-09-16 ENCOUNTER — Ambulatory Visit
Admission: RE | Admit: 2021-09-16 | Discharge: 2021-09-16 | Disposition: A | Discharge status: Home / Self Care | Payer: Medicare HMO | Source: Ambulatory Visit | Attending: Family Medicine | Admitting: Family Medicine

## 2021-09-16 DIAGNOSIS — Z1231 Encounter for screening mammogram for malignant neoplasm of breast: Secondary | ICD-10-CM

## 2021-09-20 ENCOUNTER — Ambulatory Visit (INDEPENDENT_AMBULATORY_CARE_PROVIDER_SITE_OTHER): Payer: Medicare HMO | Admitting: Family Medicine

## 2021-09-20 ENCOUNTER — Other Ambulatory Visit: Payer: Self-pay

## 2021-09-20 ENCOUNTER — Encounter (INDEPENDENT_AMBULATORY_CARE_PROVIDER_SITE_OTHER): Payer: Self-pay | Admitting: Family Medicine

## 2021-09-20 VITALS — BP 105/68 | HR 60 | Temp 97.7°F | Ht 59.0 in | Wt 159.0 lb

## 2021-09-20 DIAGNOSIS — F439 Reaction to severe stress, unspecified: Secondary | ICD-10-CM

## 2021-09-20 DIAGNOSIS — E669 Obesity, unspecified: Secondary | ICD-10-CM

## 2021-09-20 DIAGNOSIS — Z6832 Body mass index (BMI) 32.0-32.9, adult: Secondary | ICD-10-CM

## 2021-09-20 DIAGNOSIS — R69 Illness, unspecified: Secondary | ICD-10-CM | POA: Diagnosis not present

## 2021-09-20 DIAGNOSIS — K52839 Microscopic colitis, unspecified: Secondary | ICD-10-CM | POA: Diagnosis not present

## 2021-09-20 DIAGNOSIS — R7301 Impaired fasting glucose: Secondary | ICD-10-CM

## 2021-09-20 MED ORDER — OZEMPIC (2 MG/DOSE) 8 MG/3ML ~~LOC~~ SOPN: 2.0000 mg | PEN_INJECTOR | SUBCUTANEOUS | 2 refills | Status: DC

## 2021-09-20 NOTE — Progress Notes (Signed)
Chief Complaint:   OBESITY Laura Bush is here to discuss her progress with her obesity treatment plan along with follow-up of her obesity related diagnoses. See Medical Weight Management Flowsheet for complete bioelectrical impedance results.  Today's visit was #: 22 Starting weight: 196 lbs Starting date: 10/17/2019 Weight change since last visit: 2 lbs Total lbs lost to date: 37 lbs Total weight loss percentage to date: -18.88%  Nutrition Plan: [place] for 90-95% of the time. Activity: Yoga/walking for 60 minutes 3-4 times per week. Anti-obesity medications: Ozempic 2 mg subcutaneously weekly. Reported side effects: None.  Interim History: Laura Bush is currently on HCTZ 25 mg.  She has history of microscopic colitis, followed by Dr. Henrene Pastor.  She feels that this may be starting to flare. Hx of budesonide for treatment.   Assessment/Plan:   1. Impaired fasting glucose, with polyphagia Controlled. Current treatment: Ozempic 2 mg subcutaneously weekly.    Plan: Continue Ozempic 2 mg subcutaneously weekly.  She will continue to focus on protein-rich, low simple carbohydrate foods. We reviewed the importance of hydration, regular exercise for stress reduction, and restorative sleep.  - Refill Semaglutide, 2 MG/DOSE, (OZEMPIC, 2 MG/DOSE,) 8 MG/3ML SOPN; Inject 2 mg into the skin once a week.  Dispense: 9 mL; Refill: 2  2. Hx of microscopic colitis She is followed by Dr. Henrene Pastor in GI.  Takes Entocort. We will continue to monitor symptoms as they relate to her weight loss journey.  3. Situational stress Will continue to monitor as it relates to her weight loss journey. Behavior modification techniques were discussed today to help deal with emotional/non-hunger eating behaviors.  4. Obesity, current BMI 32.2  Course: Laura Bush is currently in the action stage of change. As such, her goal is to continue with weight loss efforts.   Nutrition goals: She has agreed to the  [employer].   Exercise goals:  As is.  Behavioral modification strategies: increasing lean protein intake, decreasing simple carbohydrates, and increasing vegetables.  Laura Bush has agreed to follow-up with our clinic in 4 weeks. She was informed of the importance of frequent follow-up visits to maximize her success with intensive lifestyle modifications for her multiple health conditions.   Objective:   Blood pressure 105/68, pulse 60, temperature 97.7 F (36.5 C), temperature source Oral, height 4\' 11"  (1.499 m), weight 159 lb (72.1 kg), SpO2 98 %. Body mass index is 32.11 kg/m.  General: Cooperative, alert, well developed, in no acute distress. HEENT: Conjunctivae and lids unremarkable. Cardiovascular: Regular rhythm.  Lungs: Normal work of breathing. Neurologic: No focal deficits.   Lab Results  Component Value Date   CREATININE 0.86 09/03/2021   BUN 19 09/03/2021   NA 138 09/03/2021   K 3.8 09/03/2021   CL 100 09/03/2021   CO2 31 09/03/2021   Lab Results  Component Value Date   ALT 23 09/03/2021   AST 19 09/03/2021   ALKPHOS 57 09/03/2021   BILITOT 0.6 09/03/2021   Lab Results  Component Value Date   HGBA1C 5.2 04/06/2020   HGBA1C 5.6 04/03/2019   HGBA1C 5.6 03/28/2018   Lab Results  Component Value Date   INSULIN 12.6 10/17/2019   Lab Results  Component Value Date   TSH 0.88 10/05/2020   Lab Results  Component Value Date   CHOL 156 10/05/2020   HDL 54.30 10/05/2020   LDLCALC 66 10/05/2020   TRIG 182.0 (H) 10/05/2020   CHOLHDL 3 10/05/2020   Lab Results  Component  Value Date   VD25OH 44.11 10/05/2020   VD25OH 38.52 04/03/2019   VD25OH 43.05 03/22/2017   Lab Results  Component Value Date   WBC 5.0 09/03/2021   HGB 13.1 09/03/2021   HCT 38.7 09/03/2021   MCV 97.2 09/03/2021   PLT 190 09/03/2021   Lab Results  Component Value Date   IRON 75 09/03/2021   TIBC 438 09/03/2021   FERRITIN 39 09/03/2021   Attestation Statements:    Reviewed by clinician on day of visit: allergies, medications, problem list, medical history, surgical history, family history, social history, and previous encounter notes.  I, Water quality scientist, CMA, am acting as transcriptionist for Briscoe Deutscher, DO  I have reviewed the above documentation for accuracy and completeness, and I agree with the above. -  Briscoe Deutscher, DO, MS, FAAFP, DABOM - Family and Bariatric Medicine.

## 2021-09-21 ENCOUNTER — Encounter (INDEPENDENT_AMBULATORY_CARE_PROVIDER_SITE_OTHER): Payer: Self-pay

## 2021-09-27 DIAGNOSIS — G4733 Obstructive sleep apnea (adult) (pediatric): Secondary | ICD-10-CM | POA: Diagnosis not present

## 2021-09-30 ENCOUNTER — Ambulatory Visit: Payer: Medicare HMO

## 2021-09-30 DIAGNOSIS — G4733 Obstructive sleep apnea (adult) (pediatric): Secondary | ICD-10-CM | POA: Diagnosis not present

## 2021-09-30 NOTE — Progress Notes (Unsigned)
Subjective:   Laura Bush is a 71 y.o. female who presents for Medicare Annual (Subsequent) preventive examination.  Review of Systems    ***       Objective:    There were no vitals filed for this visit. There is no height or weight on file to calculate BMI.  Advanced Directives 09/03/2021 06/04/2021 03/05/2021 09/30/2020 09/24/2020 07/29/2019 07/26/2018  Does Patient Have a Medical Advance Directive? Yes Yes Yes Yes Yes Yes Yes  Type of Paramedic of [place];Living will Living will;Healthcare Power of Attorney Living will;Healthcare Power of [place];Living will [place];Living will [place];Living will [place];Living will  Does patient want to make changes to medical advance directive? - No - Patient declined No - Patient declined - - No - Patient declined -  Copy of [place] in Chart? - - Yes - validated most recent copy scanned in chart (See row information) Yes - validated most recent copy scanned in chart (See row information) Yes - validated most recent copy scanned in chart (See row information) Yes - validated most recent copy scanned in chart (See row information) Yes - validated most recent copy scanned in chart (See row information)  Would patient like information on creating a medical advance directive? - - - - - - -    Current Medications (verified) Outpatient Encounter Medications as of 09/30/2021  Medication Sig   ACETAMINOPHEN PO Take 650 mg by mouth 2 (two) times daily as needed (for shoulder and other mild to moderate pain).   aMILoride (MIDAMOR) 5 MG tablet Take 1 tablet (5 mg total) by mouth daily.   aspirin EC 81 MG tablet Take 81 mg by mouth daily. Swallow whole.   B Complex-C (B-COMPLEX WITH VITAMIN C) tablet Take 1 tablet by mouth daily.   CALCIUM CARBONATE-VITAMIN D PO Take 1 tablet by mouth 2 (two) times daily. 600mg   calcium, D3 (1000)IU   carvedilol (COREG) 6.25 MG tablet Take 1 tablet (6.25 mg total) by mouth 2 (two) times daily with a meal.   Elderberry 575 MG/5ML SYRP Take by mouth daily.   Fexofenadine HCl (ALLEGRA PO) Take 1 tablet by mouth daily as needed.   Fluorouracil (TOLAK) 4 % CREA Apply topically.   hydrochlorothiazide (MICROZIDE) 12.5 MG capsule TAKE 1 CAPSULE BY MOUTH EVERY DAY   ibuprofen (ADVIL) 200 MG tablet Take 400 mg by mouth daily as needed (for pain not relieved by Tylenol / acetaminophen).   influenza vaccine adjuvanted (FLUAD) 0.5 ML injection Inject into the muscle.   ipratropium (ATROVENT) 0.06 % nasal spray Place 2 sprays into both nostrils 4 (four) times daily. Use as needed   pantoprazole (PROTONIX) 40 MG tablet Take 1 tablet (40 mg total) by mouth daily.   Semaglutide, 2 MG/DOSE, (OZEMPIC, 2 MG/DOSE,) 8 MG/3ML SOPN Inject 2 mg into the skin once a week.   simvastatin (ZOCOR) 20 MG tablet Take 1 tablet (20 mg total) by mouth daily.   telmisartan (MICARDIS) 80 MG tablet Take 1 tablet (80 mg total) by mouth daily.   No facility-administered encounter medications on file as of 09/30/2021.    Allergies (verified) Diclofenac, Amlodipine, Lanolin, and Tape   History: Past Medical History:  Diagnosis Date   Asthma    in cold weather   Breast cancer ([place]) 09/18/14   left breast   Breast cancer ([place]) 10/07/14   bx left breast   Breast cancer  of upper-outer quadrant of left female breast ([place]) 09/22/2014   Cluster headaches    in her 60's   GERD (gastroesophageal reflux disease)    Heart murmur    as a child (has outgrown)   High cholesterol    Hypertension    Lower extremity edema    Microscopic colitis    Morbid obesity ([place]) 09/30/2019   Obesity (BMI 30-39.9) 11/11/2020   OSA on CPAP    Personal history of radiation therapy    Pneumonia 2000's X 1   PONV (postoperative nausea and vomiting)    Rosacea    S/P radiation therapy 12/30/14-01/27/15   left breast 50Gy total  dose   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"   Swallowing difficulty    Venous insufficiency    Past Surgical History:  Procedure Laterality Date   ABDOMINAL HYSTERECTOMY  1999   APPENDECTOMY  ~ 2006   BREAST BIOPSY Left 10/2014   BREAST LUMPECTOMY Left 2016   BREAST REDUCTION SURGERY Bilateral 11/11/2014   Procedure: Bilateral Breast Reduction;  Surgeon: Crissie Reese, MD;  Location: [facility];  Service: Plastics;  Laterality: Bilateral;   CARPAL TUNNEL RELEASE Bilateral    2 surgeries on right, 1 on left   CESAREAN SECTION  1978; 1980   COLONOSCOPY     FOREARM FRACTURE SURGERY Right 2003 X 3   KNEE ARTHROSCOPY Bilateral    meniscus repair   PARTIAL MASTECTOMY WITH NEEDLE LOCALIZATION AND AXILLARY SENTINEL LYMPH NODE BX Left 11/11/2014   Procedure: LEFT BREAST PARTIAL MASTECTOMY WITH NEEDLE LOCALIZATION TIMES TWO AND LEFT AXILLARY SENTINEL LYMPH NODE Biopsy;  Surgeon: Fanny Skates, MD;  Location: [facility];  Service: General;  Laterality: Left;   SQUAMOUS CELL CARCINOMA EXCISION  1980's X 1   "nose"   TONSILLECTOMY  ~ [place]  ?1984   Family History  Problem Relation Age of Onset   Dementia Mother        Lives in [place]   Hypertension Mother    Hyperlipidemia Mother    Stroke Father        deceased   Heart failure Father    Hypertension Father    Uterine cancer Sister 40   Heart attack Maternal Grandmother    Hypertension Maternal Grandfather    Stroke Paternal Grandfather    Colon cancer Neg Hx    Esophageal cancer Neg Hx    Pancreatic cancer Neg Hx    Stomach cancer Neg Hx    Liver disease Neg Hx    Sleep apnea Neg Hx    Social History   Socioeconomic History   Marital status: Married    Spouse name: Randy Bush   Number of children: Not on file   Years of education: Not on file   Highest education level: Not on file  Occupational History   Occupation: retired  Tobacco Use   Smoking status: Never   Smokeless tobacco: Never  Vaping Use   Vaping Use:  Never used  Substance and Sexual Activity   Alcohol use: Yes    Alcohol/week: 2.0 standard drinks    Types: 2 Glasses of wine per week    Comment: 2 glasses a week   Drug use: No   Sexual activity: Not Currently  Other Topics Concern   Not on file  Social History Narrative   From [place].   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 Update 02/09/2021 retired   2 children, boy and  girl   Has 4 grandchildren   Quilting, sewing, reading.   Social Determinants of Health   Financial Resource Strain: Low Risk    Difficulty of Paying Living Expenses: Not hard at all  Food Insecurity: Not on file  Transportation Needs: No Transportation Needs   Lack of Transportation (Medical): No   Lack of Transportation (Non-Medical): No  Physical Activity: Sufficiently Active   Days of Exercise per Week: 5 days   Minutes of Exercise per Session: 30 min  Stress: Stress Concern Present   Feeling of Stress : To some extent  Social Connections: Not on file    Tobacco Counseling Counseling given: Not Answered   Clinical Intake:                 Diabetic?No         Activities of Daily Living No flowsheet data found.  Patient Care Team: Copland, Gay Filler, MD as PCP - General (Family Medicine) Skeet Latch, MD as Attending Physician (Cardiology) Cherre Robins, RPH-CPP (Pharmacist) Irene Shipper, MD as Consulting Physician (Gastroenterology)  Indicate any recent Medical Services you may have received from other than [facility] providers in the past year (date may be approximate).     Assessment:   This is a routine wellness examination for [place].  Hearing/Vision screen No results found.  Dietary issues and exercise activities discussed:     Goals Addressed   None    Depression Screen PHQ 2/9 Scores 04/05/2021 01/21/2021 09/24/2020 10/17/2019 07/29/2019 07/26/2018  PHQ - 2 Score 2 0 0 2 0 0  PHQ- 9 Score - - - 5 - -    Fall  Risk Fall Risk  04/05/2021 01/21/2021 09/24/2020 07/26/2018 03/02/2015  Falls in the past year? 0 0 0 0 No  Number falls in past yr: - - 0 - -  Injury with Fall? - - 0 - -  Follow up - - Falls prevention discussed - -    FALL RISK PREVENTION PERTAINING TO THE HOME:  Any stairs in or around the home? {YES/NO:21197} If so, are there any without handrails? {YES/NO:21197} Home free of loose throw rugs in walkways, pet beds, electrical cords, etc? {YES/NO:21197} Adequate lighting in your home to reduce risk of falls? {YES/NO:21197}  ASSISTIVE DEVICES UTILIZED TO PREVENT FALLS:  Life alert? {YES/NO:21197} Use of a cane, walker or w/c? {YES/NO:21197} Grab bars in the bathroom? {YES/NO:21197} Shower chair or bench in shower? {YES/NO:21197} Elevated toilet seat or a handicapped toilet? {YES/NO:21197}  TIMED UP AND GO:  Was the test performed? {YES/NO:21197}.  Length of time to ambulate 10 feet: *** sec.   {Appearance of TFTD:3220254}  Cognitive Function:        Immunizations Immunization History  Administered Date(s) Administered   Fluad Quad(high Dose 65+) 04/03/2019, 04/29/2020, 05/07/2021   Hepatitis A 08/24/1995, 12/23/1996   Hepatitis B 12/23/1996, 02/14/1997, 05/24/1997, 06/04/1998   IPV 08/24/1995   Influenza Split 04/06/2017   Influenza, High Dose Seasonal PF 05/10/2016, 04/06/2017   Influenza,inj,Quad PF,6+ Mos 04/30/2015   Influenza-Unspecified 07/07/2005, 06/08/2006, 05/28/2007, 04/23/2008, 05/23/2011, 05/14/2012, 05/20/2013, 04/12/2018, 04/03/2019   Meningococcal Conjugate 05/07/2008   Meningococcal Mcv4o 05/07/2008   Moderna SARS-COV2 Booster Vaccination 12/22/2020   PFIZER(Purple Top)SARS-COV-2 Vaccination 09/13/2019, 10/08/2019, 05/21/2020   Pfizer Covid-19 Vaccine Bivalent Booster 25yrs & up 05/24/2021   Pneumococcal Conjugate-13 03/22/2017   Pneumococcal Polysaccharide-23 06/08/2006, 03/28/2018   Tdap 09/19/2011   Typhoid Inactivated 05/07/2008   Yellow  Fever 05/08/2008   Zoster Recombinat (Shingrix) 10/06/2018, 01/18/2019   Zoster,  Live 06/21/2013    TDAP status: Up to date  Flu Vaccine status: Up to date  Pneumococcal vaccine status: Up to date  Covid-19 vaccine status: Completed vaccines  Qualifies for Shingles Vaccine? Yes   Zostavax completed Yes   Shingrix Completed?: Yes  Screening Tests Health Maintenance  Topic Date Due   TETANUS/TDAP  09/18/2021   MAMMOGRAM  09/17/2023   COLONOSCOPY (Pts 45-69yrs Insurance coverage will need to be confirmed)  04/09/2025   Pneumonia Vaccine 31+ Years old  Completed   INFLUENZA VACCINE  Completed   DEXA SCAN  Completed   COVID-19 Vaccine  Completed   Hepatitis C Screening  Completed   Zoster Vaccines- Shingrix  Completed   HPV VACCINES  Aged Out   Fecal DNA (Cologuard)  Discontinued    Health Maintenance  Health Maintenance Due  Topic Date Due   TETANUS/TDAP  09/18/2021    Colorectal cancer screening: Type of screening: {Colorectal Screening Types:24994}. Completed 04/09/2020. Repeat every 5 years  Mammogram status: Completed 09/16/2021. Repeat every year  Bone Density status: Completed 10/05/2020. Results reflect: Bone density results: OSTEOPOROSIS. Repeat every 2 years.  Lung Cancer Screening: (Low Dose CT Chest recommended if Age 76-80 years, 30 pack-year currently smoking OR have quit w/in 15years.) {DOES NOT does:27190::"does not"} qualify.   Lung Cancer Screening Referral: ***  Additional Screening:  Hepatitis C Screening: does qualify; Completed 04/30/2016  Vision Screening: Recommended annual ophthalmology exams for early detection of glaucoma and other disorders of the eye. Is the patient up to date with their annual eye exam?  {YES/NO:21197} Who is the provider or what is the name of the office in which the patient attends annual eye exams? *** If pt is not established with a provider, would they like to be referred to a provider to establish care?  {YES/NO:21197}.   Dental Screening: Recommended annual dental exams for proper oral hygiene  Community Resource Referral / Chronic Care Management: CRR required this visit?  {YES/NO:21197}  CCM required this visit?  {YES/NO:21197}     Plan:     I have personally reviewed and noted the following in the patients chart:   Medical and social history Use of alcohol, tobacco or illicit drugs  Current medications and supplements including opioid prescriptions.  Functional ability and status Nutritional status Physical activity Advanced directives List of other physicians Hospitalizations, surgeries, and ER visits in previous 12 months Vitals Screenings to include cognitive, depression, and falls Referrals and appointments  In addition, I have reviewed and discussed with patient certain preventive protocols, quality metrics, and best practice recommendations. A written personalized care plan for preventive services as well as general preventive health recommendations were provided to patient.     Duard Brady Raymona Boss, CMA   09/30/2021   Nurse Notes: ***

## 2021-10-18 ENCOUNTER — Other Ambulatory Visit: Payer: Self-pay

## 2021-10-18 ENCOUNTER — Encounter (INDEPENDENT_AMBULATORY_CARE_PROVIDER_SITE_OTHER): Payer: Self-pay | Admitting: Bariatrics

## 2021-10-18 ENCOUNTER — Ambulatory Visit (INDEPENDENT_AMBULATORY_CARE_PROVIDER_SITE_OTHER): Payer: Medicare HMO | Admitting: Bariatrics

## 2021-10-18 VITALS — BP 106/71 | HR 72 | Temp 97.9°F | Ht 59.0 in | Wt 159.0 lb

## 2021-10-18 DIAGNOSIS — I10 Essential (primary) hypertension: Secondary | ICD-10-CM | POA: Diagnosis not present

## 2021-10-18 DIAGNOSIS — E782 Mixed hyperlipidemia: Secondary | ICD-10-CM | POA: Diagnosis not present

## 2021-10-18 DIAGNOSIS — E669 Obesity, unspecified: Secondary | ICD-10-CM | POA: Diagnosis not present

## 2021-10-18 DIAGNOSIS — Z6832 Body mass index (BMI) 32.0-32.9, adult: Secondary | ICD-10-CM | POA: Diagnosis not present

## 2021-10-18 NOTE — Progress Notes (Unsigned)
Subjective:   Laura Bush is a 71 y.o. female who presents for Medicare Annual (Subsequent) preventive examination.  Review of Systems    ***       Objective:    There were no vitals filed for this visit. There is no height or weight on file to calculate BMI.  Advanced Directives 09/03/2021 06/04/2021 03/05/2021 09/30/2020 09/24/2020 07/29/2019 07/26/2018  Does Patient Have a Medical Advance Directive? Yes Yes Yes Yes Yes Yes Yes  Type of Paramedic of [place];Living will Living will;Healthcare Power of Attorney Living will;Healthcare Power of [place];Living will [place];Living will [place];Living will [place];Living will  Does patient want to make changes to medical advance directive? - No - Patient declined No - Patient declined - - No - Patient declined -  Copy of [place] in Chart? - - Yes - validated most recent copy scanned in chart (See row information) Yes - validated most recent copy scanned in chart (See row information) Yes - validated most recent copy scanned in chart (See row information) Yes - validated most recent copy scanned in chart (See row information) Yes - validated most recent copy scanned in chart (See row information)  Would patient like information on creating a medical advance directive? - - - - - - -    Current Medications (verified) Outpatient Encounter Medications as of 10/19/2021  Medication Sig   ACETAMINOPHEN PO Take 650 mg by mouth 2 (two) times daily as needed (for shoulder and other mild to moderate pain).   aMILoride (MIDAMOR) 5 MG tablet Take 1 tablet (5 mg total) by mouth daily.   aspirin EC 81 MG tablet Take 81 mg by mouth daily. Swallow whole.   B Complex-C (B-COMPLEX WITH VITAMIN C) tablet Take 1 tablet by mouth daily.   CALCIUM CARBONATE-VITAMIN D PO Take 1 tablet by mouth 2 (two) times daily. 600mg   calcium, D3 (1000)IU   carvedilol (COREG) 6.25 MG tablet Take 1 tablet (6.25 mg total) by mouth 2 (two) times daily with a meal.   Elderberry 575 MG/5ML SYRP Take by mouth daily.   Fexofenadine HCl (ALLEGRA PO) Take 1 tablet by mouth daily as needed.   Fluorouracil (TOLAK) 4 % CREA Apply topically.   hydrochlorothiazide (MICROZIDE) 12.5 MG capsule TAKE 1 CAPSULE BY MOUTH EVERY DAY (Patient taking differently: TAKE 2 CAPSULES BY MOUTH EVERY DAY)   ibuprofen (ADVIL) 200 MG tablet Take 400 mg by mouth daily as needed (for pain not relieved by Tylenol / acetaminophen).   influenza vaccine adjuvanted (FLUAD) 0.5 ML injection Inject into the muscle.   ipratropium (ATROVENT) 0.06 % nasal spray Place 2 sprays into both nostrils 4 (four) times daily. Use as needed   pantoprazole (PROTONIX) 40 MG tablet Take 1 tablet (40 mg total) by mouth daily.   Semaglutide, 2 MG/DOSE, (OZEMPIC, 2 MG/DOSE,) 8 MG/3ML SOPN Inject 2 mg into the skin once a week.   simvastatin (ZOCOR) 20 MG tablet Take 1 tablet (20 mg total) by mouth daily.   telmisartan (MICARDIS) 80 MG tablet Take 1 tablet (80 mg total) by mouth daily.   No facility-administered encounter medications on file as of 10/19/2021.    Allergies (verified) Diclofenac, Amlodipine, Lanolin, and Tape   History: Past Medical History:  Diagnosis Date   Asthma    in cold weather   Breast cancer ([facility]) 09/18/14   left breast   Breast cancer ([facility])  10/07/14   bx left breast   Breast cancer of upper-outer quadrant of left female breast ([facility]) 09/22/2014   Cluster headaches    in her 40's   GERD (gastroesophageal reflux disease)    Heart murmur    as a child (has outgrown)   High cholesterol    Hypertension    Lower extremity edema    Microscopic colitis    Morbid obesity ([facility]) 09/30/2019   Obesity (BMI 30-39.9) 11/11/2020   OSA on CPAP    Personal history of radiation therapy    Pneumonia 2000's X 1   PONV (postoperative nausea and vomiting)    Rosacea     S/P radiation therapy 12/30/14-01/27/15   left breast 50Gy total dose   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"   Swallowing difficulty    Venous insufficiency    Past Surgical History:  Procedure Laterality Date   ABDOMINAL HYSTERECTOMY  1999   APPENDECTOMY  ~ 2006   BREAST BIOPSY Left 10/2014   BREAST LUMPECTOMY Left 2016   BREAST REDUCTION SURGERY Bilateral 11/11/2014   Procedure: Bilateral Breast Reduction;  Surgeon: Crissie Reese, MD;  Location: [facility];  Service: Plastics;  Laterality: Bilateral;   CARPAL TUNNEL RELEASE Bilateral    2 surgeries on right, 1 on left   CESAREAN SECTION  1978; 1980   COLONOSCOPY     FOREARM FRACTURE SURGERY Right 2003 X 3   KNEE ARTHROSCOPY Bilateral    meniscus repair   PARTIAL MASTECTOMY WITH NEEDLE LOCALIZATION AND AXILLARY SENTINEL LYMPH NODE BX Left 11/11/2014   Procedure: LEFT BREAST PARTIAL MASTECTOMY WITH NEEDLE LOCALIZATION TIMES TWO AND LEFT AXILLARY SENTINEL LYMPH NODE Biopsy;  Surgeon: Fanny Skates, MD;  Location: [facility];  Service: General;  Laterality: Left;   SQUAMOUS CELL CARCINOMA EXCISION  1980's X 1   "nose"   TONSILLECTOMY  ~ [place]  ?1984   Family History  Problem Relation Age of Onset   Dementia Mother        Lives in [place]   Hypertension Mother    Hyperlipidemia Mother    Stroke Father        deceased   Heart failure Father    Hypertension Father    Uterine cancer Sister 50   Heart attack Maternal Grandmother    Hypertension Maternal Grandfather    Stroke Paternal Grandfather    Colon cancer Neg Hx    Esophageal cancer Neg Hx    Pancreatic cancer Neg Hx    Stomach cancer Neg Hx    Liver disease Neg Hx    Sleep apnea Neg Hx    Social History   Socioeconomic History   Marital status: Married    Spouse name: Randy Bush   Number of children: Not on file   Years of education: Not on file   Highest education level: Not on file  Occupational History   Occupation: retired  Tobacco Use   Smoking  status: Never   Smokeless tobacco: Never  Vaping Use   Vaping Use: Never used  Substance and Sexual Activity   Alcohol use: Yes    Alcohol/week: 2.0 standard drinks    Types: 2 Glasses of wine per week    Comment: 2 glasses a week   Drug use: No   Sexual activity: Not Currently  Other Topics Concern   Not on file  Social History Narrative   From [place].   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 Update 02/09/2021 retired   2 children, boy and girl   Has 4 grandchildren   Quilting, sewing, reading.   Social Determinants of Health   Financial Resource Strain: Low Risk    Difficulty of Paying Living Expenses: Not hard at all  Food Insecurity: Not on file  Transportation Needs: No Transportation Needs   Lack of Transportation (Medical): No   Lack of Transportation (Non-Medical): No  Physical Activity: Sufficiently Active   Days of Exercise per Week: 5 days   Minutes of Exercise per Session: 30 min  Stress: Stress Concern Present   Feeling of Stress : To some extent  Social Connections: Not on file    Tobacco Counseling Counseling given: Not Answered   Clinical Intake:                 Diabetic?No         Activities of Daily Living No flowsheet data found.  Patient Care Team: Copland, Gay Filler, MD as PCP - General (Family Medicine) Skeet Latch, MD as Attending Physician (Cardiology) Cherre Robins, RPH-CPP (Pharmacist) Irene Shipper, MD as Consulting Physician (Gastroenterology)  Indicate any recent Medical Services you may have received from other than [facility] providers in the past year (date may be approximate).     Assessment:   This is a routine wellness examination for [place].  Hearing/Vision screen No results found.  Dietary issues and exercise activities discussed:     Goals Addressed   None    Depression Screen PHQ 2/9 Scores 04/05/2021 01/21/2021 09/24/2020 10/17/2019 07/29/2019 07/26/2018   PHQ - 2 Score 2 0 0 2 0 0  PHQ- 9 Score - - - 5 - -    Fall Risk Fall Risk  04/05/2021 01/21/2021 09/24/2020 07/26/2018 03/02/2015  Falls in the past year? 0 0 0 0 No  Number falls in past yr: - - 0 - -  Injury with Fall? - - 0 - -  Follow up - - Falls prevention discussed - -    FALL RISK PREVENTION PERTAINING TO THE HOME:  Any stairs in or around the home? {YES/NO:21197} If so, are there any without handrails? {YES/NO:21197} Home free of loose throw rugs in walkways, pet beds, electrical cords, etc? {YES/NO:21197} Adequate lighting in your home to reduce risk of falls? {YES/NO:21197}  ASSISTIVE DEVICES UTILIZED TO PREVENT FALLS:  Life alert? {YES/NO:21197} Use of a cane, walker or w/c? {YES/NO:21197} Grab bars in the bathroom? {YES/NO:21197} Shower chair or bench in shower? {YES/NO:21197} Elevated toilet seat or a handicapped toilet? {YES/NO:21197}  TIMED UP AND GO:  Was the test performed? {YES/NO:21197}.  Length of time to ambulate 10 feet: *** sec.   {Appearance of ZJQB:3419379}  Cognitive Function:        Immunizations Immunization History  Administered Date(s) Administered   Fluad Quad(high Dose 65+) 04/03/2019, 04/29/2020, 05/07/2021   Hepatitis A 08/24/1995, 12/23/1996   Hepatitis B 12/23/1996, 02/14/1997, 05/24/1997, 06/04/1998   IPV 08/24/1995   Influenza Split 04/06/2017   Influenza, High Dose Seasonal PF 05/10/2016, 04/06/2017   Influenza,inj,Quad PF,6+ Mos 04/30/2015   Influenza-Unspecified 07/07/2005, 06/08/2006, 05/28/2007, 04/23/2008, 05/23/2011, 05/14/2012, 05/20/2013, 04/12/2018, 04/03/2019   Meningococcal Conjugate 05/07/2008   Meningococcal Mcv4o 05/07/2008   Moderna SARS-COV2 Booster Vaccination 12/22/2020   PFIZER(Purple Top)SARS-COV-2 Vaccination 09/13/2019, 10/08/2019, 05/21/2020   Pfizer Covid-19 Vaccine Bivalent Booster 107yrs & up 05/24/2021   Pneumococcal Conjugate-13 03/22/2017   Pneumococcal Polysaccharide-23 06/08/2006, 03/28/2018    Tdap 09/19/2011   Typhoid Inactivated 05/07/2008   Yellow Fever 05/08/2008  Zoster Recombinat (Shingrix) 10/06/2018, 01/18/2019   Zoster, Live 06/21/2013    TDAP status: Due, Education has been provided regarding the importance of this vaccine. Advised may receive this vaccine at local pharmacy or Health Dept. Aware to provide a copy of the vaccination record if obtained from local pharmacy or Health Dept. Verbalized acceptance and understanding.  Flu Vaccine status: Up to date  Pneumococcal vaccine status: Up to date  Covid-19 vaccine status: Completed vaccines  Qualifies for Shingles Vaccine? Yes   Zostavax completed No   Shingrix Completed?: Yes  Screening Tests Health Maintenance  Topic Date Due   TETANUS/TDAP  09/18/2021   MAMMOGRAM  09/17/2023   COLONOSCOPY (Pts 45-74yrs Insurance coverage will need to be confirmed)  04/09/2025   Pneumonia Vaccine 56+ Years old  Completed   INFLUENZA VACCINE  Completed   DEXA SCAN  Completed   COVID-19 Vaccine  Completed   Hepatitis C Screening  Completed   Zoster Vaccines- Shingrix  Completed   HPV VACCINES  Aged Out   Fecal DNA (Cologuard)  Discontinued    Health Maintenance  Health Maintenance Due  Topic Date Due   TETANUS/TDAP  09/18/2021    Colorectal cancer screening: Type of screening: Colonoscopy. Completed 04/28/20. Repeat every 5 years  Mammogram status: Completed 09/16/21. Repeat every year  Bone Density status: Completed 10/05/20. Results reflect: Bone density results: OSTEOPOROSIS. Repeat every 2 years.  Lung Cancer Screening: (Low Dose CT Chest recommended if Age 60-80 years, 30 pack-year currently smoking OR have quit w/in 15years.) does not qualify.   Lung Cancer Screening Referral: N/A  Additional Screening:  Hepatitis C Screening: does qualify; Completed 01/29/16  Vision Screening: Recommended annual ophthalmology exams for early detection of glaucoma and other disorders of the eye. Is the patient up to  date with their annual eye exam?  {YES/NO:21197} Who is the provider or what is the name of the office in which the patient attends annual eye exams? *** If pt is not established with a provider, would they like to be referred to a provider to establish care? {YES/NO:21197}.   Dental Screening: Recommended annual dental exams for proper oral hygiene  Community Resource Referral / Chronic Care Management: CRR required this visit?  {YES/NO:21197}  CCM required this visit?  {YES/NO:21197}     Plan:     I have personally reviewed and noted the following in the patients chart:   Medical and social history Use of alcohol, tobacco or illicit drugs  Current medications and supplements including opioid prescriptions.  Functional ability and status Nutritional status Physical activity Advanced directives List of other physicians Hospitalizations, surgeries, and ER visits in previous 12 months Vitals Screenings to include cognitive, depression, and falls Referrals and appointments  In addition, I have reviewed and discussed with patient certain preventive protocols, quality metrics, and best practice recommendations. A written personalized care plan for preventive services as well as general preventive health recommendations were provided to patient.     Duard Brady Kashana Breach, CMA   10/18/2021   Nurse Notes: ***

## 2021-10-19 ENCOUNTER — Ambulatory Visit (INDEPENDENT_AMBULATORY_CARE_PROVIDER_SITE_OTHER): Payer: Medicare HMO

## 2021-10-19 ENCOUNTER — Encounter (INDEPENDENT_AMBULATORY_CARE_PROVIDER_SITE_OTHER): Payer: Self-pay | Admitting: Bariatrics

## 2021-10-19 VITALS — BP 110/62 | HR 68 | Temp 98.2°F | Resp 16 | Ht 59.0 in | Wt 159.8 lb

## 2021-10-19 DIAGNOSIS — Z0000 Encounter for general adult medical examination without abnormal findings: Secondary | ICD-10-CM | POA: Diagnosis not present

## 2021-10-19 MED ORDER — HYDROCHLOROTHIAZIDE 25 MG PO TABS: 25.0000 mg | ORAL_TABLET | Freq: Every day | ORAL | 3 refills | Status: DC

## 2021-10-19 NOTE — Progress Notes (Signed)
? ? ? ?Chief Complaint:  ? ?OBESITY ?Laura Bush is here to discuss her progress with her obesity treatment plan along with follow-up of her obesity related diagnoses. Laura Bush is on the [employer] plus chicken and states she is following her eating plan approximately 90% of the time. Laura Bush states she is doing yoga for 60 minutes 2 times per week. ? ?Today's visit was #: 23 ?Starting weight: 196 lbs ?Starting date: 10/17/2019 ?Today's weight: 159 lbs ?Today's date: 10/18/2021 ?Total lbs lost to date: 37 lbs ?Total lbs lost since last in-office visit: 0 ? ?Interim History: Laura Bush has increased her Ozempic to 2 mg without side effects. Her appetite is normal or decreased.  ? ?Subjective:  ? ?1. Essential hypertension ?Laura Bush's blood pressure is controlled. Her last blood pressure was 105/68. ? ?2. Mixed hyperlipidemia ?Laura Bush is currently taking Zocor.  ? ?Assessment/Plan:  ? ?1. Essential hypertension ?Laura Bush will continue taking her medication. She will change to 2 days. She is working on healthy weight loss and exercise to improve blood pressure control. We will watch for signs of hypotension as she continues her lifestyle modifications. ? ?2. Mixed hyperlipidemia ?Cardiovascular risk and specific lipid/LDL goals reviewed.  Laura Bush will continue taking Zocor. We discussed several lifestyle modifications today and Laura Bush will continue to work on diet, exercise and weight loss efforts. Orders and follow up as documented in patient record.  ? ?Counseling ?Intensive lifestyle modifications are the first line treatment for this issue. ?Dietary changes: Increase soluble fiber. Decrease simple carbohydrates. ?Exercise changes: Moderate to vigorous-intensity aerobic activity 150 minutes per week if tolerated. ?Lipid-lowering medications: see documented in medical record. ? ?3. Obesity, current BMI 32.1 ?Laura Bush is currently in the action stage of change. As such, her goal is to continue with weight loss efforts. She has  agreed to the [employer] plus chicken.  ? ?Laura Bush will continue meal planning. She will adhere closely to the plan.  ? ?Exercise goals:  Laura Bush will resume walking and arm weights. ? ?Behavioral modification strategies: increasing lean protein intake, decreasing simple carbohydrates, increasing vegetables, increasing water intake, decreasing eating out, no skipping meals, meal planning and cooking strategies, keeping healthy foods in the home, and planning for success. ? ?Laura Bush has agreed to follow-up with our clinic in 3 weeks with myself or Faith Rogue, NP or Anders Grant, PA-C. She was informed of the importance of frequent follow-up visits to maximize her success with intensive lifestyle modifications for her multiple health conditions.  ? ?Objective:  ? ?Blood pressure 106/71, pulse 72, temperature 97.9 ?F (36.6 ?C), height 4\' 11"  (1.499 m), weight 159 lb (72.1 kg), SpO2 100 %. ?Body mass index is 32.11 kg/m?. ? ?General: Cooperative, alert, well developed, in no acute distress. ?HEENT: Conjunctivae and lids unremarkable. ?Cardiovascular: Regular rhythm.  ?Lungs: Normal work of breathing. ?Neurologic: No focal deficits.  ? ?Lab Results  ?Component Value Date  ? CREATININE 0.86 09/03/2021  ? BUN 19 09/03/2021  ? NA 138 09/03/2021  ? K 3.8 09/03/2021  ? CL 100 09/03/2021  ? CO2 31 09/03/2021  ? ?Lab Results  ?Component Value Date  ? ALT 23 09/03/2021  ? AST 19 09/03/2021  ? ALKPHOS 57 09/03/2021  ? BILITOT 0.6 09/03/2021  ? ?Lab Results  ?Component Value Date  ? HGBA1C 5.2 04/06/2020  ? HGBA1C 5.6 04/03/2019  ? HGBA1C 5.6 03/28/2018  ? ?Lab Results  ?Component Value Date  ? INSULIN 12.6 10/17/2019  ? ?Lab Results  ?Component Value Date  ? TSH 0.88 10/05/2020  ? ?  Lab Results  ?Component Value Date  ? CHOL 156 10/05/2020  ? HDL 54.30 10/05/2020  ? [place] 66 10/05/2020  ? TRIG 182.0 (H) 10/05/2020  ? CHOLHDL 3 10/05/2020  ? ?Lab Results  ?Component Value Date  ? VD25OH 44.11 10/05/2020  ? VD25OH 38.52  04/03/2019  ? VD25OH 43.05 03/22/2017  ? ?Lab Results  ?Component Value Date  ? WBC 5.0 09/03/2021  ? HGB 13.1 09/03/2021  ? HCT 38.7 09/03/2021  ? MCV 97.2 09/03/2021  ? PLT 190 09/03/2021  ? ?Lab Results  ?Component Value Date  ? IRON 75 09/03/2021  ? TIBC 438 09/03/2021  ? FERRITIN 39 09/03/2021  ? ?Attestation Statements:  ? ?Reviewed by clinician on day of visit: allergies, medications, problem list, medical history, surgical history, family history, social history, and previous encounter notes. ? ?I, Lizbeth Bark, RMA, am acting as transcriptionist for [employer], DO. ? ?I have reviewed the above documentation for accuracy and completeness, and I agree with the above. Jearld Lesch, DO ? ?

## 2021-10-19 NOTE — Patient Instructions (Signed)
[name] , ?Thank you for taking time to come for your Medicare Wellness Visit. I appreciate your ongoing commitment to your health goals. Please review the following plan we discussed and let me know if I can assist you in the future.  ? ?Screening recommendations/referrals: ?Colonoscopy: 04/28/20 due 04/28/25 ?Mammogram: 09/16/21 due 09/16/22 ?Bone Density: 10/05/20 due 10/05/22 ?Recommended yearly ophthalmology/optometry visit for glaucoma screening and checkup ?Recommended yearly dental visit for hygiene and checkup ? ?Vaccinations: ?Influenza vaccine: up to date ?Pneumococcal vaccine: up to date ?Tdap vaccine: Due-May obtain vaccine at your local pharmacy.  ?Shingles vaccine: up to date   ?Covid-19:completed ? ?Advanced directives: yes, copy on file ? ?Conditions/risks identified: see problem ? ?Next appointment: Follow up in one year for your annual wellness visit 10/20/2022 ? ? ?Preventive Care 71 Years and Older, Female ?Preventive care refers to lifestyle choices and visits with your health care provider that can promote health and wellness. ?What does preventive care include? ?A yearly physical exam. This is also called an annual well check. ?Dental exams once or twice a year. ?Routine eye exams. Ask your health care provider how often you should have your eyes checked. ?Personal lifestyle choices, including: ?Daily care of your teeth and gums. ?Regular physical activity. ?Eating a healthy diet. ?Avoiding tobacco and drug use. ?Limiting alcohol use. ?Practicing safe sex. ?Taking low-dose aspirin every day. ?Taking vitamin and mineral supplements as recommended by your health care provider. ?What happens during an annual well check? ?The services and screenings done by your health care provider during your annual well check will depend on your age, overall health, lifestyle risk factors, and family history of disease. ?Counseling  ?Your health care provider may ask you questions about your: ?Alcohol use. ?Tobacco  use. ?Drug use. ?Emotional well-being. ?Home and relationship well-being. ?Sexual activity. ?Eating habits. ?History of falls. ?Memory and ability to understand (cognition). ?Work and work Statistician. ?Reproductive health. ?Screening  ?You may have the following tests or measurements: ?Height, weight, and BMI. ?Blood pressure. ?Lipid and cholesterol levels. These may be checked every 5 years, or more frequently if you are over 63 years old. ?Skin check. ?Lung cancer screening. You may have this screening every year starting at age 10 if you have a 30-pack-year history of smoking and currently smoke or have quit within the past 15 years. ?Fecal occult blood test (FOBT) of the stool. You may have this test every year starting at age 7. ?Flexible sigmoidoscopy or colonoscopy. You may have a sigmoidoscopy every 5 years or a colonoscopy every 10 years starting at age 27. ?Hepatitis C blood test. ?Hepatitis B blood test. ?Sexually transmitted disease (STD) testing. ?Diabetes screening. This is done by checking your blood sugar (glucose) after you have not eaten for a while (fasting). You may have this done every 1-3 years. ?Bone density scan. This is done to screen for osteoporosis. You may have this done starting at age 59. ?Mammogram. This may be done every 1-2 years. Talk to your health care provider about how often you should have regular mammograms. ?Talk with your health care provider about your test results, treatment options, and if necessary, the need for more tests. ?Vaccines  ?Your health care provider may recommend certain vaccines, such as: ?Influenza vaccine. This is recommended every year. ?Tetanus, diphtheria, and acellular pertussis (Tdap, Td) vaccine. You may need a Td booster every 10 years. ?Zoster vaccine. You may need this after age 21. ?Pneumococcal 13-valent conjugate (PCV13) vaccine. One dose is recommended after age 56. ?  Pneumococcal polysaccharide (PPSV23) vaccine. One dose is recommended  after age 79. ?Talk to your health care provider about which screenings and vaccines you need and how often you need them. ?This information is not intended to replace advice given to you by your health care provider. Make sure you discuss any questions you have with your health care provider. ?Document Released: 08/21/2015 Document Revised: 04/13/2016 Document Reviewed: 05/26/2015 ?Elsevier Interactive Patient Education ? 2017 [place]. ? ?Fall Prevention in the Home ?Falls can cause injuries. They can happen to people of all ages. There are many things you can do to make your home safe and to help prevent falls. ?What can I do on the outside of my home? ?Regularly fix the edges of walkways and driveways and fix any cracks. ?Remove anything that might make you trip as you walk through a door, such as a raised step or threshold. ?Trim any bushes or trees on the path to your home. ?Use bright outdoor lighting. ?Clear any walking paths of anything that might make someone trip, such as rocks or tools. ?Regularly check to see if handrails are loose or broken. Make sure that both sides of any steps have handrails. ?Any raised decks and porches should have guardrails on the edges. ?Have any leaves, snow, or ice cleared regularly. ?Use sand or salt on walking paths during winter. ?Clean up any spills in your garage right away. This includes oil or grease spills. ?What can I do in the bathroom? ?Use night lights. ?Install grab bars by the toilet and in the tub and shower. Do not use towel bars as grab bars. ?Use non-skid mats or decals in the tub or shower. ?If you need to sit down in the shower, use a plastic, non-slip stool. ?Keep the floor dry. Clean up any water that spills on the floor as soon as it happens. ?Remove soap buildup in the tub or shower regularly. ?Attach bath mats securely with double-sided non-slip rug tape. ?Do not have throw rugs and other things on the floor that can make you trip. ?What can I do  in the bedroom? ?Use night lights. ?Make sure that you have a light by your bed that is easy to reach. ?Do not use any sheets or blankets that are too big for your bed. They should not hang down onto the floor. ?Have a firm chair that has side arms. You can use this for support while you get dressed. ?Do not have throw rugs and other things on the floor that can make you trip. ?What can I do in the kitchen? ?Clean up any spills right away. ?Avoid walking on wet floors. ?Keep items that you use a lot in easy-to-reach places. ?If you need to reach something above you, use a strong step stool that has a grab bar. ?Keep electrical cords out of the way. ?Do not use floor polish or wax that makes floors slippery. If you must use wax, use non-skid floor wax. ?Do not have throw rugs and other things on the floor that can make you trip. ?What can I do with my stairs? ?Do not leave any items on the stairs. ?Make sure that there are handrails on both sides of the stairs and use them. Fix handrails that are broken or loose. Make sure that handrails are as long as the stairways. ?Check any carpeting to make sure that it is firmly attached to the stairs. Fix any carpet that is loose or worn. ?Avoid having throw rugs at the  top or bottom of the stairs. If you do have throw rugs, attach them to the floor with carpet tape. ?Make sure that you have a light switch at the top of the stairs and the bottom of the stairs. If you do not have them, ask someone to add them for you. ?What else can I do to help prevent falls? ?Wear shoes that: ?Do not have high heels. ?Have rubber bottoms. ?Are comfortable and fit you well. ?Are closed at the toe. Do not wear sandals. ?If you use a stepladder: ?Make sure that it is fully opened. Do not climb a closed stepladder. ?Make sure that both sides of the stepladder are locked into place. ?Ask someone to hold it for you, if possible. ?Clearly mark and make sure that you can see: ?Any grab bars or  handrails. ?First and last steps. ?Where the edge of each step is. ?Use tools that help you move around (mobility aids) if they are needed. These include: ?Canes. ?Walkers. ?Scooters. ?Crutches. ?Turn on the li

## 2021-10-20 DIAGNOSIS — Z853 Personal history of malignant neoplasm of breast: Secondary | ICD-10-CM | POA: Diagnosis not present

## 2021-10-20 DIAGNOSIS — I10 Essential (primary) hypertension: Secondary | ICD-10-CM | POA: Diagnosis not present

## 2021-10-20 DIAGNOSIS — Z6833 Body mass index (BMI) 33.0-33.9, adult: Secondary | ICD-10-CM | POA: Diagnosis not present

## 2021-10-20 DIAGNOSIS — K219 Gastro-esophageal reflux disease without esophagitis: Secondary | ICD-10-CM | POA: Diagnosis not present

## 2021-10-20 DIAGNOSIS — E11620 Type 2 diabetes mellitus with diabetic dermatitis: Secondary | ICD-10-CM | POA: Diagnosis not present

## 2021-10-20 DIAGNOSIS — E785 Hyperlipidemia, unspecified: Secondary | ICD-10-CM | POA: Diagnosis not present

## 2021-10-20 DIAGNOSIS — Z823 Family history of stroke: Secondary | ICD-10-CM | POA: Diagnosis not present

## 2021-10-20 DIAGNOSIS — Z8249 Family history of ischemic heart disease and other diseases of the circulatory system: Secondary | ICD-10-CM | POA: Diagnosis not present

## 2021-10-20 DIAGNOSIS — Z7985 Long-term (current) use of injectable non-insulin antidiabetic drugs: Secondary | ICD-10-CM | POA: Diagnosis not present

## 2021-10-20 DIAGNOSIS — Z7982 Long term (current) use of aspirin: Secondary | ICD-10-CM | POA: Diagnosis not present

## 2021-10-20 DIAGNOSIS — Z809 Family history of malignant neoplasm, unspecified: Secondary | ICD-10-CM | POA: Diagnosis not present

## 2021-10-20 DIAGNOSIS — E669 Obesity, unspecified: Secondary | ICD-10-CM | POA: Diagnosis not present

## 2021-10-20 DIAGNOSIS — Z008 Encounter for other general examination: Secondary | ICD-10-CM | POA: Diagnosis not present

## 2021-10-25 DIAGNOSIS — G4733 Obstructive sleep apnea (adult) (pediatric): Secondary | ICD-10-CM | POA: Diagnosis not present

## 2021-10-26 ENCOUNTER — Ambulatory Visit (INDEPENDENT_AMBULATORY_CARE_PROVIDER_SITE_OTHER): Payer: Medicare HMO | Admitting: Pharmacist

## 2021-10-26 DIAGNOSIS — I7300 Raynaud's syndrome without gangrene: Secondary | ICD-10-CM

## 2021-10-26 DIAGNOSIS — E782 Mixed hyperlipidemia: Secondary | ICD-10-CM

## 2021-10-26 DIAGNOSIS — E8881 Metabolic syndrome: Secondary | ICD-10-CM

## 2021-10-26 DIAGNOSIS — I10 Essential (primary) hypertension: Secondary | ICD-10-CM

## 2021-10-26 NOTE — Patient Instructions (Signed)
Laura Bush ?It was a pleasure speaking with you today.  ?I have attached a summary of our visit today and information about your health goals. (See Below)  ? ? ?If you have any questions or concerns, please feel free to contact me either at the phone number below or with a MyChart message.  ? ?Keep up the good work! ? ?Laura Bush, PharmD ?Clinical Pharmacist ?[facility] ?Laura Bush ?[phone] (direct line)  ?[phone] (main office number) ? ? ?Chronic Care Management Care Plan - updated 10/26/2021 ? ?Hypertension ?BP Readings from Last 3 Encounters:  ?10/19/21 110/62  ?10/18/21 106/71  ?09/20/21 105/68  ? ? ?Pharmacist Clinical Goal(s): ?Over the next 180 days, patient will work with PharmD and providers to maintain BP goal <140/90 ?Current regimen:  ?Telmisartan 80mg  1 tablet daily ?Hydrochlorothiazide 12.5mg  take 1 capsule daily ?Carvedilol 6.25mg  1 tablet twice daily ?Amiloride 5mg   1 tablet daily ?Patient self care activities - Over the next 180 days, patient will: ?Continue to check blood pressure 3 times per week and document, and provide at future appointments ?Ensure daily salt intake < 2300 mg/day ?Continue current medications for blood pressure control ?Limit intake of NSAIDs as they have increased blood pressure in past ?  ?Hyperlipidemia ?Lab Results  ?Component Value Date  ? [place] 66 10/05/2020  ? ? ?Pharmacist Clinical Goal(s): ?Over the next 180 days, patient will work with PharmD and providers to maintain LDL goal < 100 ?Current regimen:  ?Simvastatin 20mg  daily in evening ?Patient self care activities - Over the next 180 days, patient will: ?Maintain cholesterol medication regimen.  ?  ?Metabolic Syndrome ?Pharmacist Clinical Goal(s): ?Over the next 180 days, patient will work with PharmD and providers to maintain A1c goal <6.5% ?Lab Results  ?Component Value Date  ? HGBA1C 5.2 04/06/2020  ? ?Current regimen:  ?Ozempic 2mg  injected subcutaneously weekly ?Has lost a  total of 37lbs; starting weight was 196 lbs ?Great Job! ?Patient self care activities - Over the next 180 days, patient will: ?Maintain a1c <6.5% ?Continue follow up with weight management clinic and recommended diet. Great job! ?Restart walking daily when weather warms. Continue to do Yoga 2 times per week.  ? ?  ?Medication management ?Pharmacist Clinical Goal(s): ?Over the next 180 days, patient will work with PharmD and providers to maintain optimal medication adherence ?[facility] ?Interventions ?Comprehensive medication review performed. ?Patient self care activities - Over the next 180 days, patient will: ?Focus on medication adherence by filling and taking medications appropriately  ?Take medications as prescribed ?Report any questions or concerns to PharmD and/or provider(s) ?  ?  ?Health Maintenance:  ?Discussed getting Tetanus vaccine - last was 09/2011. Recommend Tetnus booster every 10 years. She can get anytime at pharmacy. Should be no cost with medicare changes in 2023.  ? ?Patient Goals/Self-Care Activities ?Over the next 180 days, patient will:  ?take medications as prescribed  ?target a minimum of 150 minutes of moderate intensity exercise weekly ?Pick up hydrochlorothiazide at [facility] - contact Laura Bush [phone] if you continue to have difficulty getting filled.  ? ?Patient verbalizes understanding of instructions and care plan provided today and agrees to view in [place]. Active MyChart status confirmed with patient.    ?

## 2021-10-26 NOTE — Chronic Care Management (AMB) (Signed)
? ? ?Chronic Care Management ?Pharmacy Note ? ?10/26/2021 ?Name:  Laura Bush MRN:  [id number] DOB:  09-15-1950 ? ?Summary:  ?Patient is doing well over all. She does report 1 or 2 episodes of Raynaud's symptoms per day. She was unable to tolerate amlodipine in past due to edema / swelling. Other options to consider if symptoms are bothersome or more frequent - 1) trial of another non dihydropyridine CCB like nifedipine Er 30 to 129m or felodipine 2.5 to 514mdaily. 2) PDE5 inhibitor like sildenafil 2049maily to tid. 3) topical nitrate to affected area or 4) SSRI like fluoxetine. ?Hydrochlorothiazide dose was recently increased due to edema (per patient was advised by Dr WalJuleen Chinath weight management clinic to increase hydrochlorothiazide to 34m62mily). Updated Rx was sent in 10/19/2021 but per patient [facility]. [facility] will be will today.  ? ?Subjective: ?Laura Watson JordMartiniquean 70 y89. year old female who is a primary patient of Copland, JessGay Filler.  The CCM team was consulted for assistance with disease management and care coordination needs.   ? ?Engaged with patient by telephone for follow up visit in response to provider referral for pharmacy case management and/or care coordination services.  ? ?Consent to Services:  ?The patient was given information about Chronic Care Management services, agreed to services, and gave verbal consent prior to initiation of services.  Please see initial visit note for detailed documentation.  ? ?Patient Care Team: ?Copland, JessGay Filler as PCP - General (Family Medicine) ?RandSkeet Latch as Attending Physician (Cardiology) ?EckaCherre RobinsH-CPP (Pharmacist) ?PerrIrene Shipper as Consulting Physician (Gastroenterology) ? ?Recent office visits: ?07/14/2021 - PCP (Dr CoplLorelei Ponten for f/u chronic conditions. No medication changes noted. follow up in 6 months.  ?01/21/2021 - PCP (Dr CoplLorelei Pontute  visit for lip biting / left sided weakness. R/O TIA or stroke. Metfromin removed from med list - patient preference. MRI ordered. MRI showed some changes, referred to neurology.  ? ?Recent consult visits: ?10/18/2021 - [facility] (Dr BrowOwens Shark PescHenrycommended increase soluble fiber. Continue with diet and exercise changes. Continue Ozempic 2mg 56mkly ?09/20/2021 - [facility] (Dr WallaJuleen [place] weight loss. No medication changes noted.  ?09/03/2021 - [facility] ([facility] F/U Hemochromatosis, heterozygous for the H63D mutation. Checked iron studies.Were WNL. ?[facility]. doing well on CPAP and follow up lucunar stroke. Continue preventative measure for secondary stroke prevention. F/U 1 year ? ?Hospital visits: ?None in previous 6 months ? ?Objective: ? ?Lab Results  ?Component Value Date  ? CREATININE 0.86 09/03/2021  ? CREATININE 0.84 07/16/2021  ? CREATININE 0.89 06/04/2021  ? ? ?Lab Results  ?Component Value Date  ? HGBA1C 5.2 04/06/2020  ? ?Last diabetic Eye exam: No results found for: HMDIABEYEEXA  ?Last diabetic Foot exam: No results found for: HMDIABFOOTEX  ? ?   ?Component Value Date/Time  ? CHOL 156 10/05/2020 1059  ? TRIG 182.0 (H) 10/05/2020 1059  ? HDL 54.30 10/05/2020 1059  ? CHOLHDL 3 10/05/2020 1059  ? VLDL 36.4 10/05/2020 1059  ? LDLCACedar[place]2/28/2022 1059  ? LDLCARib [place]8/30/2021 1059  ? ? ?Hepatic Function Latest Ref Rng & Units 09/03/2021 07/16/2021 06/04/2021  ?Total Protein 6.5 - 8.1 g/dL 7.1 6.7 7.1  ?Albumin 3.5 - 5.0 g/dL 4.4 4.2 4.3  ?AST 15 - 41 U/L _0 ?ALT 0 -  44 U/L _0 ?Alk Phosphatase 38 - 126 U/L 57 56 52  ?Total Bilirubin 0.3 - 1.2 mg/dL 0.6 0.6 0.6  ? ? ?Lab Results  ?Component Value Date/Time  ? TSH 0.88 10/05/2020 10:59 AM  ? TSH 1.370 10/17/2019 12:06 PM  ? FREET4 1.00 10/17/2019 12:06 PM  ? ? ?CBC Latest Ref Rng & Units 09/03/2021 07/16/2021 06/04/2021  ?WBC 4.0 - 10.5 K/uL 5.0 4.0 4.5   ?Hemoglobin 12.0 - 15.0 g/dL 13.1 13.0 13.1  ?Hematocrit 36.0 - 46.0 % 38.7 37.1 38.3  ?Platelets 150 - 400 K/uL 190 193 200  ? ? ?Lab Results  ?Component Value Date/Time  ? VD25OH 44.11 10/05/2020 10:59 AM  ? VD25OH 38.52 04/03/2019 10:37 AM  ? ? ?Clinical ASCVD:  Yes - suspected TIA based in MRI; unable to determine when occurred. Aspirin 45m daily started by neurologist 02/09/2021 ?The ASCVD Risk score (Arnett DK, et al., 2019) failed to calculate for the following reasons: ?  The patient has a prior MI or stroke diagnosis   ? ? ?Social History  ? ?Tobacco Use  ?Smoking Status Never  ?Smokeless Tobacco Never  ? ?BP Readings from Last 3 Encounters:  ?10/19/21 110/62  ?10/18/21 106/71  ?09/20/21 105/68  ? ?Pulse Readings from Last 3 Encounters:  ?10/19/21 68  ?10/18/21 72  ?09/20/21 60  ? ?Wt Readings from Last 3 Encounters:  ?10/19/21 159 lb 12.8 oz (72.5 kg)  ?10/18/21 159 lb (72.1 kg)  ?09/20/21 159 lb (72.1 kg)  ? ? ?Assessment: Review of patient past medical history, allergies, medications, health status, including review of consultants reports, laboratory and other test data, was performed as part of comprehensive evaluation and provision of chronic care management services.  ? ?SDOH:  (Social Determinants of Health) assessments and interventions performed:  ?SDOH Interventions   ? ?Flowsheet Row Most Recent Value  ?SDOH Interventions   ?Financial Strain Interventions Intervention Not Indicated  ?Physical Activity Interventions Other (Comments)  [once warmer weather patient will add walking 2 or 3 days per week]  ? ?  ? ? ?[place]? ?Allergies  ?Allergen Reactions  ? Diclofenac Hypertension  ? Amlodipine Swelling  ? Lanolin   ? Tape Other (See Comments)  ?  Redness (Bandaids also)  ? ? ?Medications Reviewed Today   ? ? Reviewed by ECherre Robins RPH-CPP (Pharmacist) on 10/26/21 at 1116  Med List Status: <None>  ? ?Medication Order Taking? Sig Documenting Provider Last Dose Status Informant   ?ACETAMINOPHEN PO [id number] Take 650 mg by mouth 2 (two) times daily as needed (for shoulder and other mild to moderate pain). [provider] Taking Active   ?aMILoride (MIDAMOR) 5 MG tablet [id number] Take 1 tablet (5 mg total) by mouth daily. Copland, JGay Filler MD Taking Active   ?aspirin EC 81 MG tablet [id number] Take 81 mg by mouth daily. Swallow whole. [provider] Taking Active   ?B Complex-C (B-COMPLEX WITH VITAMIN C) tablet [id number] Take 1 tablet by mouth daily. [provider] Taking Active   ?CALCIUM CARBONATE-VITAMIN D PO [id number] Take 1 tablet by mouth daily. 658mcalcium, D3 1000IU [provider] Taking Active   ?carvedilol (COREG) 6.25 MG tablet [id number] Take 1 tablet (6.25 mg total) by mouth 2 (two) times daily with a meal. Copland, JeGay FillerMD Taking Active   ?Elderberry 575 MG/5ML SYRP [id number] Take by mouth daily. [provider] Taking Active   ?Fexofenadine HCl (ALLEGRA PO)  [id number] Yes Take 1 tablet by mouth daily as needed. [provider] Taking Active   ?Fluorouracil (TOLAK) 4 % CREA [id number] Yes Apply topically. For 3 weeks [provider] Taking Active   ?hydrochlorothiazide (HYDRODIURIL) 25 MG tablet [id number] Yes Take 1 tablet (25 mg total) by mouth daily. Copland, Gay Filler, MD Taking Active   ?ibuprofen (ADVIL) 200 MG tablet [id number] Yes Take 400 mg by mouth daily as needed (for pain not relieved by Tylenol / acetaminophen). [provider] Taking Active   ?ipratropium (ATROVENT) 0.06 % nasal spray [id number] Yes Place 2 sprays into both nostrils 4 (four) times daily. Use as needed Copland, Gay Filler, MD Taking Active   ?pantoprazole (PROTONIX) 40 MG tablet [id number] Yes Take 1 tablet (40 mg total) by mouth daily.  ?Patient taking differently: Take 40 mg by mouth every other day.  ? Copland, Gay Filler, MD Taking Active   ?Semaglutide, 2 MG/DOSE, (OZEMPIC, 2 MG/DOSE,) 8  MG/3ML SOPN [id number] Yes Inject 2 mg into the skin once a week. Briscoe Deutscher, DO Taking Active   ?simvastatin (ZOCOR) 20 MG tablet [id number] Yes Take 1 tablet (20 mg total) by mouth daily. Copland, Gay Filler, MD Taking

## 2021-10-28 DIAGNOSIS — G4733 Obstructive sleep apnea (adult) (pediatric): Secondary | ICD-10-CM | POA: Diagnosis not present

## 2021-10-31 ENCOUNTER — Encounter: Payer: Self-pay | Admitting: Family Medicine

## 2021-11-05 ENCOUNTER — Other Ambulatory Visit: Payer: Self-pay | Admitting: Family Medicine

## 2021-11-05 ENCOUNTER — Encounter: Payer: Self-pay | Admitting: Family Medicine

## 2021-11-05 DIAGNOSIS — I10 Essential (primary) hypertension: Secondary | ICD-10-CM | POA: Diagnosis not present

## 2021-11-05 DIAGNOSIS — E785 Hyperlipidemia, unspecified: Secondary | ICD-10-CM | POA: Diagnosis not present

## 2021-11-05 MED ORDER — NIFEDIPINE ER OSMOTIC RELEASE 30 MG PO TB24: 30.0000 mg | ORAL_TABLET | Freq: Every day | ORAL | 3 refills | Status: DC

## 2021-11-15 ENCOUNTER — Encounter (INDEPENDENT_AMBULATORY_CARE_PROVIDER_SITE_OTHER): Payer: Self-pay | Admitting: Bariatrics

## 2021-11-15 ENCOUNTER — Ambulatory Visit (INDEPENDENT_AMBULATORY_CARE_PROVIDER_SITE_OTHER): Payer: Medicare HMO | Admitting: Bariatrics

## 2021-11-15 ENCOUNTER — Encounter: Payer: Self-pay | Admitting: Family Medicine

## 2021-11-15 VITALS — BP 117/74 | HR 66 | Temp 97.6°F | Ht 59.0 in | Wt 158.0 lb

## 2021-11-15 DIAGNOSIS — E669 Obesity, unspecified: Secondary | ICD-10-CM

## 2021-11-15 DIAGNOSIS — E782 Mixed hyperlipidemia: Secondary | ICD-10-CM | POA: Diagnosis not present

## 2021-11-15 DIAGNOSIS — R7301 Impaired fasting glucose: Secondary | ICD-10-CM | POA: Diagnosis not present

## 2021-11-15 DIAGNOSIS — Z6832 Body mass index (BMI) 32.0-32.9, adult: Secondary | ICD-10-CM | POA: Diagnosis not present

## 2021-11-15 MED ORDER — OZEMPIC (2 MG/DOSE) 8 MG/3ML ~~LOC~~ SOPN: 2.0000 mg | PEN_INJECTOR | SUBCUTANEOUS | 0 refills | Status: DC

## 2021-11-16 NOTE — Progress Notes (Signed)
? ? ? ?Chief Complaint:  ? ?OBESITY ?Laura Bush is here to discuss her progress with her obesity treatment plan along with follow-up of her obesity related diagnoses. Laura Bush is on the [employer] plus chicken and states she is following her eating plan approximately 90% of the time. Laura Bush states she is doing yoga for 60 minutes 2 times per week. ? ?Today's visit was #: 24 ?Starting weight: 196 lbs ?Starting date: 10/17/2019 ?Today's weight: 158 lbs ?Today's date: 11/15/2021 ?Total lbs lost to date: 38 lbs ?Total lbs lost since last in-office visit: 1 lb ? ?Interim History: Laura Bush is down 1 additional pound since her last visit. She has done well with her weight overall.  ? ?Subjective:  ? ?1. Impaired fasting glucose, with polyphagia ?Laura Bush notes decreased appetite.  ? ?2. Mixed hyperlipidemia ?Laura Bush is currently taking Zocor currently.  ? ?Assessment/Plan:  ? ?1. Impaired fasting glucose, with polyphagia ?We will refill Ozempic 2 mg for 1 month with no refills.  ? ?- Semaglutide, 2 MG/DOSE, (OZEMPIC, 2 MG/DOSE,) 8 MG/3ML SOPN; Inject 2 mg into the skin once a week.  Dispense: 9 mL; Refill: 0 ? ?2. Mixed hyperlipidemia ?Cardiovascular risk and specific lipid/LDL goals reviewed.  Laura Bush will continue Zocor. She will have no trans fats or saturated fats (diary, dark chocolate or unprocessed meats). We discussed several lifestyle modifications today and Laura Bush will continue to work on diet, exercise and weight loss efforts. Orders and follow up as documented in patient record.  ? ?Counseling ?Intensive lifestyle modifications are the first line treatment for this issue. ?Dietary changes: Increase soluble fiber. Decrease simple carbohydrates. ?Exercise changes: Moderate to vigorous-intensity aerobic activity 150 minutes per week if tolerated. ?Lipid-lowering medications: see documented in medical record. ? ?3. Obesity, current BMI 32.0 ?Laura Bush is currently in the action stage of change. As such, her goal is to  continue with weight loss efforts. She has agreed to keeping a food journal and adhering to recommended goals of 1200 calories and 80 grams of protein and the [place].  ? ?Exercise goals:  Laura Bush will start walking with warmer weather.  ? ?Behavioral modification strategies: increasing lean protein intake, decreasing simple carbohydrates, increasing vegetables, increasing water intake, decreasing eating out, no skipping meals, meal planning and cooking strategies, keeping healthy foods in the home, and planning for success. ? ?Laura Bush has agreed to follow-up with our clinic in 4 weeks with nurse practitioner and 6 weeks with myself. She was informed of the importance of frequent follow-up visits to maximize her success with intensive lifestyle modifications for her multiple health conditions.  ? ?Objective:  ? ?Blood pressure 117/74, pulse 66, temperature 97.6 ?F (36.4 ?C), height 4\' 11"  (1.499 m), weight 158 lb (71.7 kg), SpO2 100 %. ?Body mass index is 31.91 kg/m?. ? ?General: Cooperative, alert, well developed, in no acute distress. ?HEENT: Conjunctivae and lids unremarkable. ?Cardiovascular: Regular rhythm.  ?Lungs: Normal work of breathing. ?Neurologic: No focal deficits.  ? ?Lab Results  ?Component Value Date  ? CREATININE 0.86 09/03/2021  ? BUN 19 09/03/2021  ? NA 138 09/03/2021  ? K 3.8 09/03/2021  ? CL 100 09/03/2021  ? CO2 31 09/03/2021  ? ?Lab Results  ?Component Value Date  ? ALT 23 09/03/2021  ? AST 19 09/03/2021  ? ALKPHOS 57 09/03/2021  ? BILITOT 0.6 09/03/2021  ? ?Lab Results  ?Component Value Date  ? HGBA1C 5.2 04/06/2020  ? HGBA1C 5.6 04/03/2019  ? HGBA1C 5.6 03/28/2018  ? ?Lab Results  ?Component Value Date  ?  INSULIN 12.6 10/17/2019  ? ?Lab Results  ?Component Value Date  ? TSH 0.88 10/05/2020  ? ?Lab Results  ?Component Value Date  ? CHOL 156 10/05/2020  ? HDL 54.30 10/05/2020  ? [place] 66 10/05/2020  ? TRIG 182.0 (H) 10/05/2020  ? CHOLHDL 3 10/05/2020  ? ?Lab Results  ?Component Value  Date  ? VD25OH 44.11 10/05/2020  ? VD25OH 38.52 04/03/2019  ? VD25OH 43.05 03/22/2017  ? ?Lab Results  ?Component Value Date  ? WBC 5.0 09/03/2021  ? HGB 13.1 09/03/2021  ? HCT 38.7 09/03/2021  ? MCV 97.2 09/03/2021  ? PLT 190 09/03/2021  ? ?Lab Results  ?Component Value Date  ? IRON 75 09/03/2021  ? TIBC 438 09/03/2021  ? FERRITIN 39 09/03/2021  ? ?Attestation Statements:  ? ?Reviewed by clinician on day of visit: allergies, medications, problem list, medical history, surgical history, family history, social history, and previous encounter notes. ? ?I, Lizbeth Bark, RMA, am acting as transcriptionist for [employer], DO. ? ?I have reviewed the above documentation for accuracy and completeness, and I agree with the above. Jearld Lesch, DO ? ?

## 2021-11-17 ENCOUNTER — Encounter (INDEPENDENT_AMBULATORY_CARE_PROVIDER_SITE_OTHER): Payer: Self-pay | Admitting: Bariatrics

## 2021-11-25 ENCOUNTER — Encounter (INDEPENDENT_AMBULATORY_CARE_PROVIDER_SITE_OTHER): Payer: Self-pay | Admitting: Bariatrics

## 2021-11-25 DIAGNOSIS — G4733 Obstructive sleep apnea (adult) (pediatric): Secondary | ICD-10-CM | POA: Diagnosis not present

## 2021-11-28 DIAGNOSIS — G4733 Obstructive sleep apnea (adult) (pediatric): Secondary | ICD-10-CM | POA: Diagnosis not present

## 2021-12-01 ENCOUNTER — Other Ambulatory Visit: Payer: Medicare HMO

## 2021-12-01 ENCOUNTER — Ambulatory Visit: Payer: Medicare HMO | Admitting: Family

## 2021-12-14 ENCOUNTER — Ambulatory Visit (INDEPENDENT_AMBULATORY_CARE_PROVIDER_SITE_OTHER): Payer: Medicare HMO | Admitting: Physician Assistant

## 2021-12-14 ENCOUNTER — Inpatient Hospital Stay: Payer: Medicare HMO | Attending: Hematology & Oncology

## 2021-12-14 ENCOUNTER — Encounter: Payer: Self-pay | Admitting: Family

## 2021-12-14 ENCOUNTER — Ambulatory Visit (HOSPITAL_BASED_OUTPATIENT_CLINIC_OR_DEPARTMENT_OTHER)
Admission: RE | Admit: 2021-12-14 | Discharge: 2021-12-14 | Disposition: A | Discharge status: Home / Self Care | Payer: Medicare HMO | Source: Ambulatory Visit | Attending: Family | Admitting: Family

## 2021-12-14 ENCOUNTER — Other Ambulatory Visit: Payer: Self-pay

## 2021-12-14 ENCOUNTER — Inpatient Hospital Stay: Payer: Medicare HMO | Admitting: Family

## 2021-12-14 DIAGNOSIS — M79661 Pain in right lower leg: Secondary | ICD-10-CM | POA: Insufficient documentation

## 2021-12-14 DIAGNOSIS — M79651 Pain in right thigh: Secondary | ICD-10-CM | POA: Diagnosis not present

## 2021-12-14 DIAGNOSIS — M7989 Other specified soft tissue disorders: Secondary | ICD-10-CM | POA: Diagnosis not present

## 2021-12-14 LAB — IRON AND IRON BINDING CAPACITY (CC-WL,HP ONLY)
Iron: 122 ug/dL (ref 28–170)
Saturation Ratios: 28 % (ref 10.4–31.8)
TIBC: 428 ug/dL (ref 250–450)
UIBC: 306 ug/dL

## 2021-12-14 LAB — CBC WITH DIFFERENTIAL (CANCER CENTER ONLY)
Abs Immature Granulocytes: 0.02 10*3/uL (ref 0.00–0.07)
Basophils Absolute: 0 10*3/uL (ref 0.0–0.1)
Basophils Relative: 1 %
Eosinophils Absolute: 0.1 10*3/uL (ref 0.0–0.5)
Eosinophils Relative: 3 %
HCT: 37.2 % (ref 36.0–46.0)
Hemoglobin: 12.9 g/dL (ref 12.0–15.0)
Immature Granulocytes: 0 %
Lymphocytes Relative: 27 %
Lymphs Abs: 1.3 10*3/uL (ref 0.7–4.0)
MCH: 33.1 pg (ref 26.0–34.0)
MCHC: 34.7 g/dL (ref 30.0–36.0)
MCV: 95.4 fL (ref 80.0–100.0)
Monocytes Absolute: 0.6 10*3/uL (ref 0.1–1.0)
Monocytes Relative: 12 %
Neutro Abs: 2.8 10*3/uL (ref 1.7–7.7)
Neutrophils Relative %: 57 %
Platelet Count: 206 10*3/uL (ref 150–400)
RBC: 3.9 MIL/uL (ref 3.87–5.11)
RDW: 12 % (ref 11.5–15.5)
WBC Count: 4.9 10*3/uL (ref 4.0–10.5)
nRBC: 0 % (ref 0.0–0.2)

## 2021-12-14 LAB — CMP (CANCER CENTER ONLY)
ALT: 20 U/L (ref 0–44)
AST: 17 U/L (ref 15–41)
Albumin: 4.4 g/dL (ref 3.5–5.0)
Alkaline Phosphatase: 68 U/L (ref 38–126)
Anion gap: 8 (ref 5–15)
BUN: 21 mg/dL (ref 8–23)
CO2: 27 mmol/L (ref 22–32)
Calcium: 9.8 mg/dL (ref 8.9–10.3)
Chloride: 101 mmol/L (ref 98–111)
Creatinine: 0.8 mg/dL (ref 0.44–1.00)
GFR, Estimated: >60 mL/min (ref >60)
Glucose, Bld: 96 mg/dL (ref 70–99)
Potassium: 3.8 mmol/L (ref 3.5–5.1)
Sodium: 136 mmol/L (ref 135–145)
Total Bilirubin: 0.7 mg/dL (ref 0.3–1.2)
Total Protein: 6.9 g/dL (ref 6.5–8.1)

## 2021-12-14 LAB — FERRITIN: Ferritin: 33 ng/mL (ref 11–307)

## 2021-12-14 NOTE — Progress Notes (Signed)
?Hematology and Oncology Follow Up Visit ? ?Laura Bush ?[id number] ?06/16/1951 71 y.o. ?12/14/2021 ? ? ?Principle Diagnosis:  ?Hemochromatosis, heterozygous for the H63D mutation ?  ?Current Therapy:        ?Phlebotomy to maintain iron saturation < 50% and ferritin < 100 ?  ?Interim History:  Laura Bush is here today for follow-up. She is doing well. She recently drove home from [place] in one day and is now having persistent pain and mild swelling in the right upper leg.  ?She is driving to [place] later this week and would like an [place] to rule out DVT before she goes.  ?No numbness or tingling in her extremities. Pedal pulses are 1+.  ?She had a fall last month after stepping off an uneven side walk but thankfully states that she was only bruises on her left elbow and hands from bracing her fall. She states that she did not hit her head.  ?No fever, chills, n/v, cough, rash, dizziness, SOB, chest pain, palpitations, abdominal pain or changes in bowel or bladder habits.  ?She is eating well and staying hydrated throughout the day. Her weight is stable at 161 lbs.  ?No blood loss noted. No bruising or petechiae.  ? ?ECOG Performance Status: 1 - Symptomatic but completely ambulatory ? ?Medications:  ?Allergies as of 12/14/2021   ? ?   Reactions  ? Diclofenac Hypertension  ? Amlodipine Swelling  ? Lanolin Other (See Comments)  ? Sneezing and watery eyes. Allergic to [facility].  ? Tape Other (See Comments)  ? Redness (Bandaids also)  ? ?  ? ?  ?Medication List  ?  ? ?  ? Accurate as of Dec 14, 2021 10:14 AM. If you have any questions, ask your nurse or doctor.  ?  ?  ? ?  ? ?ACETAMINOPHEN PO ?Take 650 mg by mouth 2 (two) times daily as needed (for shoulder and other mild to moderate pain). ?  ?ALLEGRA PO ?Take 1 tablet by mouth daily as needed. ?  ?aMILoride 5 MG tablet ?Commonly known as: MIDAMOR ?Take 1 tablet (5 mg total) by mouth daily. ?  ?aspirin EC 81 MG tablet ?Take 81 mg by mouth daily. Swallow whole. ?   ?B-complex with vitamin C tablet ?Take 1 tablet by mouth daily. ?  ?CALCIUM CARBONATE-VITAMIN D PO ?Take 1 tablet by mouth daily. 650mg  calcium, D3 1000IU ?  ?Elderberry 575 MG/5ML Syrp ?Take by mouth daily. ?  ?hydrochlorothiazide 25 MG tablet ?Commonly known as: HYDRODIURIL ?Take 1 tablet (25 mg total) by mouth daily. ?  ?ibuprofen 200 MG tablet ?Commonly known as: ADVIL ?Take 400 mg by mouth daily as needed (for pain not relieved by Tylenol / acetaminophen). ?  ?ipratropium 0.06 % nasal spray ?Commonly known as: ATROVENT ?Place 2 sprays into both nostrils 4 (four) times daily. Use as needed ?  ?NIFEdipine 30 MG 24 hr tablet ?Commonly known as: PROCARDIA-XL/NIFEDICAL-XL ?Take 1 tablet (30 mg total) by mouth daily. ?  ?Ozempic (2 MG/DOSE) 8 MG/3ML Sopn ?Generic drug: Semaglutide (2 MG/DOSE) ?Inject 2 mg into the skin once a week. ?  ?pantoprazole 40 MG tablet ?Commonly known as: Protonix ?Take 1 tablet (40 mg total) by mouth daily. ?What changed: when to take this ?  ?simvastatin 20 MG tablet ?Commonly known as: ZOCOR ?Take 1 tablet (20 mg total) by mouth daily. ?  ?telmisartan 80 MG tablet ?Commonly known as: MICARDIS ?Take 1 tablet (80 mg total) by mouth daily. ?  ?[place] 4 % Crea ?Generic  drug: Fluorouracil ?Apply topically. For 3 weeks ?  ? ?  ? ? ?Allergies:  ?Allergies  ?Allergen Reactions  ? Diclofenac Hypertension  ? Amlodipine Swelling  ? Lanolin Other (See Comments)  ?  Sneezing and watery eyes. Allergic to [facility].  ? Tape Other (See Comments)  ?  Redness (Bandaids also)  ? ? ?Past Medical History, Surgical history, Social history, and Family History were reviewed and updated. ? ?Review of Systems: ?All other 10 point review of systems is negative.  ? ?Physical Exam: ? vitals were not taken for this visit.  ? ?Wt Readings from Last 3 Encounters:  ?11/15/21 158 lb (71.7 kg)  ?10/19/21 159 lb 12.8 oz (72.5 kg)  ?10/18/21 159 lb (72.1 kg)  ? ? ?Ocular: Sclerae unicteric, pupils equal, round and reactive to  light ?Ear-nose-throat: Oropharynx clear, dentition fair ?Lymphatic: No cervical or supraclavicular adenopathy ?Lungs no rales or rhonchi, good excursion bilaterally ?Heart regular rate and rhythm, no murmur appreciated ?Abd soft, nontender, positive bowel sounds ?MSK no focal spinal tenderness, no joint edema ?Neuro: non-focal, well-oriented, appropriate affect ?Breasts: Deferred  ? ?Lab Results  ?Component Value Date  ? WBC 4.9 12/14/2021  ? HGB 12.9 12/14/2021  ? HCT 37.2 12/14/2021  ? MCV 95.4 12/14/2021  ? PLT 206 12/14/2021  ? ?Lab Results  ?Component Value Date  ? FERRITIN 39 09/03/2021  ? IRON 75 09/03/2021  ? TIBC 438 09/03/2021  ? UIBC 363 09/03/2021  ? IRONPCTSAT 17 09/03/2021  ? ?Lab Results  ?Component Value Date  ? RETICCTPCT 1.5 10/17/2019  ? RBC 3.90 12/14/2021  ? ?No results found for: KPAFRELGTCHN, LAMBDASER, KAPLAMBRATIO ?No results found for: IGGSERUM, IGA, IGMSERUM ?No results found for: TOTALPROTELP, ALBUMINELP, A1GS, A2GS, BETS, BETA2SER, GAMS, MSPIKE, SPEI ?  Chemistry   ?   ?Component Value Date/Time  ? NA 138 09/03/2021 0952  ? NA 142 05/29/2017 1002  ? K 3.8 09/03/2021 0952  ? K 3.9 05/29/2017 1002  ? CL 100 09/03/2021 0952  ? CO2 31 09/03/2021 0952  ? CO2 28 05/29/2017 1002  ? BUN 19 09/03/2021 0952  ? BUN 13.5 05/29/2017 1002  ? CREATININE 0.86 09/03/2021 0952  ? CREATININE 0.81 04/06/2020 1059  ? CREATININE 0.8 05/29/2017 1002  ?    ?Component Value Date/Time  ? CALCIUM 10.5 (H) 09/03/2021 0354  ? CALCIUM 9.4 05/29/2017 1002  ? ALKPHOS 57 09/03/2021 0952  ? ALKPHOS 39 (L) 05/29/2017 1002  ? AST 19 09/03/2021 0952  ? AST 18 05/29/2017 1002  ? ALT 23 09/03/2021 0952  ? ALT 19 05/29/2017 1002  ? BILITOT 0.6 09/03/2021 0952  ? BILITOT 0.48 05/29/2017 1002  ?  ? ? ? ?Impression and Plan: Laura Bush is a very pleasant 71 yo caucasian female with recent diagnosis of hemochromatosis, heterozygous for the H63D mutation. ?Iron studies are pending. She will donate with [facility] of needed.  ?We  will get an [place] of her right leg this week before she heads to [place].  ?Lab only in 3 months, follow-up in 6 months.  ? ?Lottie Dawson, NP ?5/9/202310:14 AM ? ?

## 2021-12-15 ENCOUNTER — Encounter (INDEPENDENT_AMBULATORY_CARE_PROVIDER_SITE_OTHER): Payer: Self-pay | Admitting: Family Medicine

## 2021-12-15 ENCOUNTER — Ambulatory Visit (INDEPENDENT_AMBULATORY_CARE_PROVIDER_SITE_OTHER): Payer: Medicare HMO | Admitting: Family Medicine

## 2021-12-15 VITALS — BP 125/75 | HR 68 | Temp 97.6°F | Ht 59.0 in | Wt 156.0 lb

## 2021-12-15 DIAGNOSIS — I10 Essential (primary) hypertension: Secondary | ICD-10-CM | POA: Diagnosis not present

## 2021-12-15 DIAGNOSIS — R7301 Impaired fasting glucose: Secondary | ICD-10-CM

## 2021-12-15 DIAGNOSIS — E669 Obesity, unspecified: Secondary | ICD-10-CM | POA: Diagnosis not present

## 2021-12-15 DIAGNOSIS — Z6831 Body mass index (BMI) 31.0-31.9, adult: Secondary | ICD-10-CM | POA: Diagnosis not present

## 2021-12-15 MED ORDER — OZEMPIC (2 MG/DOSE) 8 MG/3ML ~~LOC~~ SOPN: 2.0000 mg | PEN_INJECTOR | SUBCUTANEOUS | 0 refills | Status: DC

## 2021-12-20 ENCOUNTER — Encounter: Payer: Self-pay | Admitting: Family Medicine

## 2021-12-24 NOTE — Progress Notes (Signed)
Chief Complaint:   OBESITY Laura Bush is here to discuss her progress with her obesity treatment plan along with follow-up of her obesity related diagnoses. Laura Bush is on keeping a food journal and adhering to recommended goals of 1200 calories and 80 grams of protein and states she is following her eating plan approximately 90% of the time. Laura Bush states she is doing for 60 minutes 2 times per week.  Today's visit was #: 25 Starting weight: 196 lbs Starting date: 10/17/2019 Today's weight: 156 lbs Today's date: 12/15/2021 Total lbs lost to date: 40 Total lbs lost since last in-office visit: 2  Interim History: Laura Bush is a patient of Dr Laura Bush and this is her 1st visit with me. Laura Bush went to [place] for a family gathering and did much more sitting around than normal. She followed the Pescatarian plan with chicken and is doing very well. She denies hunger and cravings.  Subjective:   1. Essential hypertension Laura Bush is taking Micardis, HCTZ, and Procardia. She is asymptomatic and denies dizziness, visual changes, or headaches.  2. Impaired fasting glucose, with polyphagia Laura Bush is tolerating medicines well. She denies constipation, in which she takes a capful of Miralax every day. Laura Bush is regular and is drinking 64 to 80 ounces per day.  Assessment/Plan:  No orders of the defined types were placed in this encounter.   Medications Discontinued During This Encounter  Medication Reason   Semaglutide, 2 MG/DOSE, (OZEMPIC, 2 MG/DOSE,) 8 MG/3ML SOPN Reorder     Meds ordered this encounter  Medications   Semaglutide, 2 MG/DOSE, (OZEMPIC, 2 MG/DOSE,) 8 MG/3ML SOPN    Sig: Inject 2 mg into the skin once a week.    Dispense:  9 mL    Refill:  0    90 d supply.  No RF until OV     1. Essential hypertension Laura Bush's blood pressure is at goal. Laura Bush agrees to continue her medications per her specialist or PCP. She agrees to continue her PNP and weight loss.  2. Impaired fasting  glucose, with polyphagia Laura Bush agrees to increase her water intake to 80 ounces every day or to do more exercise. Since Laura Bush is in the doughnut hole and pays the same for 30 days of medicine as she does for a 90 day supply, we will refill her Ozempic for a 90 day supply today.  - Semaglutide, 2 MG/DOSE, (OZEMPIC, 2 MG/DOSE,) 8 MG/3ML SOPN; Inject 2 mg into the skin once a week.  Dispense: 9 mL; Refill: 0  3. Obesity, Current BMI 31.7 Laura Bush is currently in the action stage of change. As such, her goal is to continue with weight loss efforts. She has agreed to the [employer] with chicken.   Exercise goals:  As is, but to add resistance training.  Behavioral modification strategies: travel eating strategies and planning for success.  Laura Bush has agreed to follow-up with our clinic in 4 weeks. She was informed of the importance of frequent follow-up visits to maximize her success with intensive lifestyle modifications for her multiple health conditions.   Objective:   Blood pressure 125/75, pulse 68, temperature 97.6 F (36.4 C), height 4\' 11"  (1.499 m), weight 156 lb (70.8 kg), SpO2 99 %. Body mass index is 31.51 kg/m.  General: Cooperative, alert, well developed, in no acute distress. HEENT: Conjunctivae and lids unremarkable. Cardiovascular: Regular rhythm.  Lungs: Normal work of breathing. Neurologic: No focal deficits.   Lab Results  Component Value Date   CREATININE 0.80 12/14/2021  BUN 21 12/14/2021   NA 136 12/14/2021   K 3.8 12/14/2021   CL 101 12/14/2021   CO2 27 12/14/2021   Lab Results  Component Value Date   ALT 20 12/14/2021   AST 17 12/14/2021   ALKPHOS 68 12/14/2021   BILITOT 0.7 12/14/2021   Lab Results  Component Value Date   HGBA1C 5.2 04/06/2020   HGBA1C 5.6 04/03/2019   HGBA1C 5.6 03/28/2018   Lab Results  Component Value Date   INSULIN 12.6 10/17/2019   Lab Results  Component Value Date   TSH 0.88 10/05/2020   Lab Results   Component Value Date   CHOL 156 10/05/2020   HDL 54.30 10/05/2020   LDLCALC 66 10/05/2020   TRIG 182.0 (H) 10/05/2020   CHOLHDL 3 10/05/2020   Lab Results  Component Value Date   VD25OH 44.11 10/05/2020   VD25OH 38.52 04/03/2019   VD25OH 43.05 03/22/2017   Lab Results  Component Value Date   WBC 4.9 12/14/2021   HGB 12.9 12/14/2021   HCT 37.2 12/14/2021   MCV 95.4 12/14/2021   PLT 206 12/14/2021   Lab Results  Component Value Date   IRON 122 12/14/2021   TIBC 428 12/14/2021   FERRITIN 33 12/14/2021    Obesity Behavioral Intervention:   Approximately 15 minutes were spent on the discussion below.  ASK: We discussed the diagnosis of obesity with Laura Bush today and Laura Bush agreed to give [place] permission to discuss obesity behavioral modification therapy today.  ASSESS: Laura Bush has the diagnosis of obesity and her BMI today is 31.7. Laura Bush is in the action stage of change.   ADVISE: Laura Bush was educated on the multiple health risks of obesity as well as the benefit of weight loss to improve her health. She was advised of the need for long term treatment and the importance of lifestyle modifications to improve her current health and to decrease her risk of future health problems.  AGREE: Multiple dietary modification options and treatment options were discussed and Laura Bush agreed to follow the recommendations documented in the above note.  ARRANGE: Laura Bush was educated on the importance of frequent visits to treat obesity as outlined per CMS and USPSTF guidelines and agreed to schedule her next follow up appointment today.  Attestation Statements:   Reviewed by clinician on day of visit: allergies, medications, problem list, medical history, surgical history, family history, social history, and previous encounter notes.  IMarcille Bush, CMA, am acting as transcriptionist for [employer], DO  I have reviewed the above documentation for accuracy and completeness, and I  agree with the above. Laura Bush, D.O.  The [place] was signed into law in 2016 which includes the topic of electronic health records.  This provides immediate access to information in MyChart.  This includes consultation notes, operative notes, office notes, lab results and pathology reports.  If you have any questions about what you read please let us know at your next visit so we can discuss your concerns and take corrective action if need be.  We are right here with you.

## 2021-12-25 DIAGNOSIS — G4733 Obstructive sleep apnea (adult) (pediatric): Secondary | ICD-10-CM | POA: Diagnosis not present

## 2021-12-28 DIAGNOSIS — G4733 Obstructive sleep apnea (adult) (pediatric): Secondary | ICD-10-CM | POA: Diagnosis not present

## 2021-12-29 ENCOUNTER — Encounter: Payer: Self-pay | Admitting: Family Medicine

## 2021-12-29 ENCOUNTER — Ambulatory Visit (INDEPENDENT_AMBULATORY_CARE_PROVIDER_SITE_OTHER): Payer: Medicare HMO | Admitting: Family Medicine

## 2021-12-29 ENCOUNTER — Ambulatory Visit (HOSPITAL_BASED_OUTPATIENT_CLINIC_OR_DEPARTMENT_OTHER)
Admission: RE | Admit: 2021-12-29 | Discharge: 2021-12-29 | Disposition: A | Discharge status: Home / Self Care | Payer: Medicare HMO | Source: Ambulatory Visit | Attending: Family Medicine | Admitting: Family Medicine

## 2021-12-29 VITALS — BP 141/71 | HR 65 | Temp 97.6°F | Resp 16 | Ht <= 58 in | Wt 164.4 lb

## 2021-12-29 DIAGNOSIS — M79604 Pain in right leg: Secondary | ICD-10-CM

## 2021-12-29 DIAGNOSIS — M25561 Pain in right knee: Secondary | ICD-10-CM | POA: Diagnosis not present

## 2021-12-29 DIAGNOSIS — M1711 Unilateral primary osteoarthritis, right knee: Secondary | ICD-10-CM | POA: Diagnosis not present

## 2021-12-29 MED ORDER — NAPROXEN 500 MG PO TABS: 500.0000 mg | ORAL_TABLET | Freq: Two times a day (BID) | ORAL | 1 refills | Status: DC

## 2021-12-29 NOTE — Progress Notes (Signed)
Acute Office Visit  Subjective:     Patient ID: Laura Bush, female    DOB: 02/08/1951, 71 y.o.   MRN: [id number]  Chief Complaint  Patient presents with   Knee Pain    Here for right knee pain     Knee Pain   Patient is in today for right posterior knee/leg pain.  Patient she was recently in [place] (long drive) and woke up one morning after getting back and had sudden right posterior knee pain that radiated to her upper leg anteriorly and posteriorly. She could not recall any specific injuries. She had a regularly scheduled hematology appointment on 12/14/21 and mentioned this to them. They ordered [place] to r/o DVT given recent travel and it was negative.   She reports pain is primarily posterior right knee, and posterior/anterior upper leg/quad. She describes the pain as a constant ache that is occasionally sharp. Pain is worse with weight bearing (taking a step when walking) and improves when she is able to stand with weight equally distributed between both legs or when resting/sitting/lying. Soreness will last for awhile following activity, but eventually resolves completely until she starts walking again. Pain gets up to 6/10 with activity. She denies any numbness/tingling, swelling, bruising, erythema, popping, clicking, locking, instability. She does think she can feel a knot in there quad muscle    ROS All review of systems negative except what is listed in the HPI      Objective:    BP (!) 141/71 (BP Location: Right Arm, Patient Position: Sitting, Cuff Size: Normal)   Pulse 65   Temp 97.6 F (36.4 C) (Oral)   Resp 16   Ht 4\' 10"  (1.473 m)   Wt 164 lb 6.4 oz (74.6 kg)   SpO2 95%   BMI 34.36 kg/m    Physical Exam Vitals reviewed.  Constitutional:      Appearance: Normal appearance.  Musculoskeletal:        General: No swelling or tenderness. Normal range of motion.     Right lower leg: No edema.     Left lower leg: No edema.     Comments: Normal knee  ligament testing, normal ROM of right knee/leg, mild quad muscle tension palpable   Skin:    Findings: No bruising or erythema.  Neurological:     General: No focal deficit present.     Mental Status: She is alert and oriented to person, place, and time. Mental status is at baseline.  Psychiatric:        Mood and Affect: Mood normal.        Behavior: Behavior normal.        Thought Content: Thought content normal.        Judgment: Judgment normal.    No results found for any visits on 12/29/21.      Assessment & Plan:    1. Right leg pain Reassuring the [place] ruled out DVT and no instability in the knee. - Let's try switching to naproxen to see if you get a better pain/inflammation response. You reported that you tolerate this well. Do not mix with other NSAIDs (ibuprofen). - Knee x-ray today for baseline imaging - Rest, ice, compression, elevation - Can alternate with heating pad for relief as well - Gentle stretching/exercise as tolerated - Referral to physical therapy and sports medicine   - DG Knee Complete 4 Views Right; Future - naproxen (NAPROSYN) 500 MG tablet; Take 1 tablet (500 mg total) by mouth 2 (two)  times daily with a meal.  Dispense: 60 tablet; Refill: 1 - Ambulatory referral to Physical Therapy - Ambulatory referral to Sports Medicine    Return if symptoms worsen or fail to improve.  Terrilyn Saver, NP

## 2021-12-29 NOTE — Patient Instructions (Addendum)
For your posterior knee and leg pain: - Let's try switching to naproxen to see if you get a better pain/inflammation response. You reported that you tolerate this well. Do not mix with other NSAIDs (ibuprofen). - Knee x-ray today for baseline imaging - Rest, ice, compression, elevation - Can alternate with heating pad for relief as well - Gentle stretching/exercise as tolerated - Referral to physical therapy and sports medicine   Please contact office for follow-up if symptoms do not improve or worsen. Seek emergency care if symptoms become severe.

## 2022-01-03 ENCOUNTER — Emergency Department (HOSPITAL_COMMUNITY): Payer: Medicare HMO

## 2022-01-03 ENCOUNTER — Inpatient Hospital Stay (HOSPITAL_COMMUNITY): Payer: Medicare HMO

## 2022-01-03 ENCOUNTER — Other Ambulatory Visit: Payer: Self-pay

## 2022-01-03 ENCOUNTER — Encounter (HOSPITAL_COMMUNITY): Payer: Self-pay

## 2022-01-03 ENCOUNTER — Inpatient Hospital Stay (HOSPITAL_COMMUNITY)
Admission: EM | Admit: 2022-01-03 | Discharge: 2022-01-07 | DRG: 478 | Disposition: A | Discharge status: Home Health | Payer: Medicare HMO | Attending: Internal Medicine | Admitting: Internal Medicine

## 2022-01-03 DIAGNOSIS — D62 Acute posthemorrhagic anemia: Secondary | ICD-10-CM | POA: Diagnosis not present

## 2022-01-03 DIAGNOSIS — Z853 Personal history of malignant neoplasm of breast: Secondary | ICD-10-CM | POA: Diagnosis not present

## 2022-01-03 DIAGNOSIS — K5900 Constipation, unspecified: Secondary | ICD-10-CM | POA: Diagnosis not present

## 2022-01-03 DIAGNOSIS — M84551A Pathological fracture in neoplastic disease, right femur, initial encounter for fracture: Secondary | ICD-10-CM | POA: Diagnosis not present

## 2022-01-03 DIAGNOSIS — E119 Type 2 diabetes mellitus without complications: Secondary | ICD-10-CM | POA: Diagnosis not present

## 2022-01-03 DIAGNOSIS — S72321A Displaced transverse fracture of shaft of right femur, initial encounter for closed fracture: Secondary | ICD-10-CM

## 2022-01-03 DIAGNOSIS — C3432 Malignant neoplasm of lower lobe, left bronchus or lung: Secondary | ICD-10-CM | POA: Diagnosis not present

## 2022-01-03 DIAGNOSIS — Z8249 Family history of ischemic heart disease and other diseases of the circulatory system: Secondary | ICD-10-CM | POA: Diagnosis not present

## 2022-01-03 DIAGNOSIS — S7291XA Unspecified fracture of right femur, initial encounter for closed fracture: Secondary | ICD-10-CM | POA: Diagnosis not present

## 2022-01-03 DIAGNOSIS — Z85828 Personal history of other malignant neoplasm of skin: Secondary | ICD-10-CM | POA: Diagnosis not present

## 2022-01-03 DIAGNOSIS — I110 Hypertensive heart disease with heart failure: Secondary | ICD-10-CM | POA: Diagnosis not present

## 2022-01-03 DIAGNOSIS — M84453A Pathological fracture, unspecified femur, initial encounter for fracture: Secondary | ICD-10-CM | POA: Diagnosis not present

## 2022-01-03 DIAGNOSIS — K219 Gastro-esophageal reflux disease without esophagitis: Secondary | ICD-10-CM | POA: Diagnosis present

## 2022-01-03 DIAGNOSIS — M84451A Pathological fracture, right femur, initial encounter for fracture: Secondary | ICD-10-CM | POA: Diagnosis not present

## 2022-01-03 DIAGNOSIS — Z20822 Contact with and (suspected) exposure to covid-19: Secondary | ICD-10-CM | POA: Diagnosis present

## 2022-01-03 DIAGNOSIS — Z808 Family history of malignant neoplasm of other organs or systems: Secondary | ICD-10-CM

## 2022-01-03 DIAGNOSIS — M79605 Pain in left leg: Secondary | ICD-10-CM | POA: Diagnosis not present

## 2022-01-03 DIAGNOSIS — G319 Degenerative disease of nervous system, unspecified: Secondary | ICD-10-CM | POA: Diagnosis not present

## 2022-01-03 DIAGNOSIS — W19XXXA Unspecified fall, initial encounter: Secondary | ICD-10-CM | POA: Diagnosis not present

## 2022-01-03 DIAGNOSIS — Z888 Allergy status to other drugs, medicaments and biological substances status: Secondary | ICD-10-CM | POA: Diagnosis not present

## 2022-01-03 DIAGNOSIS — I700 Atherosclerosis of aorta: Secondary | ICD-10-CM

## 2022-01-03 DIAGNOSIS — I2510 Atherosclerotic heart disease of native coronary artery without angina pectoris: Secondary | ICD-10-CM

## 2022-01-03 DIAGNOSIS — C50812 Malignant neoplasm of overlapping sites of left female breast: Secondary | ICD-10-CM

## 2022-01-03 DIAGNOSIS — C3490 Malignant neoplasm of unspecified part of unspecified bronchus or lung: Secondary | ICD-10-CM | POA: Diagnosis not present

## 2022-01-03 DIAGNOSIS — G4733 Obstructive sleep apnea (adult) (pediatric): Secondary | ICD-10-CM

## 2022-01-03 DIAGNOSIS — Z8673 Personal history of transient ischemic attack (TIA), and cerebral infarction without residual deficits: Secondary | ICD-10-CM

## 2022-01-03 DIAGNOSIS — E7800 Pure hypercholesterolemia, unspecified: Secondary | ICD-10-CM | POA: Diagnosis present

## 2022-01-03 DIAGNOSIS — R911 Solitary pulmonary nodule: Secondary | ICD-10-CM | POA: Diagnosis not present

## 2022-01-03 DIAGNOSIS — Z820 Family history of epilepsy and other diseases of the nervous system: Secondary | ICD-10-CM

## 2022-01-03 DIAGNOSIS — R58 Hemorrhage, not elsewhere classified: Secondary | ICD-10-CM | POA: Diagnosis not present

## 2022-01-03 DIAGNOSIS — Z79899 Other long term (current) drug therapy: Secondary | ICD-10-CM

## 2022-01-03 DIAGNOSIS — R918 Other nonspecific abnormal finding of lung field: Secondary | ICD-10-CM

## 2022-01-03 DIAGNOSIS — S72331A Displaced oblique fracture of shaft of right femur, initial encounter for closed fracture: Secondary | ICD-10-CM | POA: Diagnosis not present

## 2022-01-03 DIAGNOSIS — Z823 Family history of stroke: Secondary | ICD-10-CM

## 2022-01-03 DIAGNOSIS — Z4789 Encounter for other orthopedic aftercare: Secondary | ICD-10-CM | POA: Diagnosis not present

## 2022-01-03 DIAGNOSIS — Z9012 Acquired absence of left breast and nipple: Secondary | ICD-10-CM

## 2022-01-03 DIAGNOSIS — Z043 Encounter for examination and observation following other accident: Secondary | ICD-10-CM | POA: Diagnosis not present

## 2022-01-03 DIAGNOSIS — M898X9 Other specified disorders of bone, unspecified site: Secondary | ICD-10-CM | POA: Diagnosis present

## 2022-01-03 DIAGNOSIS — Y9226 Movie house or cinema as the place of occurrence of the external cause: Secondary | ICD-10-CM

## 2022-01-03 DIAGNOSIS — M7989 Other specified soft tissue disorders: Secondary | ICD-10-CM | POA: Diagnosis not present

## 2022-01-03 DIAGNOSIS — S0181XA Laceration without foreign body of other part of head, initial encounter: Secondary | ICD-10-CM

## 2022-01-03 DIAGNOSIS — Z923 Personal history of irradiation: Secondary | ICD-10-CM

## 2022-01-03 DIAGNOSIS — S01112A Laceration without foreign body of left eyelid and periocular area, initial encounter: Secondary | ICD-10-CM | POA: Diagnosis not present

## 2022-01-03 DIAGNOSIS — K5730 Diverticulosis of large intestine without perforation or abscess without bleeding: Secondary | ICD-10-CM | POA: Diagnosis not present

## 2022-01-03 DIAGNOSIS — E559 Vitamin D deficiency, unspecified: Secondary | ICD-10-CM | POA: Diagnosis not present

## 2022-01-03 DIAGNOSIS — I5032 Chronic diastolic (congestive) heart failure: Secondary | ICD-10-CM | POA: Diagnosis not present

## 2022-01-03 DIAGNOSIS — R000 Tachycardia, unspecified: Secondary | ICD-10-CM | POA: Diagnosis not present

## 2022-01-03 DIAGNOSIS — W010XXA Fall on same level from slipping, tripping and stumbling without subsequent striking against object, initial encounter: Secondary | ICD-10-CM

## 2022-01-03 DIAGNOSIS — Z170 Estrogen receptor positive status [ER+]: Secondary | ICD-10-CM

## 2022-01-03 DIAGNOSIS — Z7982 Long term (current) use of aspirin: Secondary | ICD-10-CM

## 2022-01-03 DIAGNOSIS — S72491A Other fracture of lower end of right femur, initial encounter for closed fracture: Secondary | ICD-10-CM | POA: Diagnosis not present

## 2022-01-03 DIAGNOSIS — S72301A Unspecified fracture of shaft of right femur, initial encounter for closed fracture: Secondary | ICD-10-CM | POA: Diagnosis not present

## 2022-01-03 DIAGNOSIS — Z6834 Body mass index (BMI) 34.0-34.9, adult: Secondary | ICD-10-CM

## 2022-01-03 DIAGNOSIS — C7951 Secondary malignant neoplasm of bone: Secondary | ICD-10-CM | POA: Diagnosis present

## 2022-01-03 DIAGNOSIS — R609 Edema, unspecified: Secondary | ICD-10-CM | POA: Diagnosis not present

## 2022-01-03 DIAGNOSIS — Z9989 Dependence on other enabling machines and devices: Secondary | ICD-10-CM | POA: Diagnosis not present

## 2022-01-03 DIAGNOSIS — S72402A Unspecified fracture of lower end of left femur, initial encounter for closed fracture: Secondary | ICD-10-CM | POA: Diagnosis not present

## 2022-01-03 DIAGNOSIS — R079 Chest pain, unspecified: Secondary | ICD-10-CM | POA: Diagnosis not present

## 2022-01-03 DIAGNOSIS — Z886 Allergy status to analgesic agent status: Secondary | ICD-10-CM | POA: Diagnosis not present

## 2022-01-03 DIAGNOSIS — E876 Hypokalemia: Secondary | ICD-10-CM | POA: Diagnosis not present

## 2022-01-03 DIAGNOSIS — I10 Essential (primary) hypertension: Secondary | ICD-10-CM | POA: Diagnosis present

## 2022-01-03 DIAGNOSIS — Z743 Need for continuous supervision: Secondary | ICD-10-CM | POA: Diagnosis not present

## 2022-01-03 DIAGNOSIS — J9811 Atelectasis: Secondary | ICD-10-CM | POA: Diagnosis not present

## 2022-01-03 LAB — BASIC METABOLIC PANEL
Anion gap: 12 (ref 5–15)
BUN: 16 mg/dL (ref 8–23)
CO2: 21 mmol/L — ABNORMAL LOW (ref 22–32)
Calcium: 9.4 mg/dL (ref 8.9–10.3)
Chloride: 105 mmol/L (ref 98–111)
Creatinine, Ser: 1 mg/dL (ref 0.44–1.00)
GFR, Estimated: >60 mL/min (ref >60)
Glucose, Bld: 136 mg/dL — ABNORMAL HIGH (ref 70–99)
Potassium: 3.4 mmol/L — ABNORMAL LOW (ref 3.5–5.1)
Sodium: 138 mmol/L (ref 135–145)

## 2022-01-03 LAB — TYPE AND SCREEN
ABO/RH(D): A POS
Antibody Screen: NEGATIVE

## 2022-01-03 LAB — CBC
HCT: 36.5 % (ref 36.0–46.0)
Hemoglobin: 12.8 g/dL (ref 12.0–15.0)
MCH: 33.4 pg (ref 26.0–34.0)
MCHC: 35.1 g/dL (ref 30.0–36.0)
MCV: 95.3 fL (ref 80.0–100.0)
Platelets: 201 10*3/uL (ref 150–400)
RBC: 3.83 MIL/uL — ABNORMAL LOW (ref 3.87–5.11)
RDW: 11.8 % (ref 11.5–15.5)
WBC: 9.1 10*3/uL (ref 4.0–10.5)
nRBC: 0 % (ref 0.0–0.2)

## 2022-01-03 LAB — HEPATIC FUNCTION PANEL
ALT: 25 U/L (ref 0–44)
AST: 28 U/L (ref 15–41)
Albumin: 3.7 g/dL (ref 3.5–5.0)
Alkaline Phosphatase: 57 U/L (ref 38–126)
Bilirubin, Direct: 0.1 mg/dL (ref 0.0–0.2)
Indirect Bilirubin: 0.3 mg/dL (ref 0.3–0.9)
Total Bilirubin: 0.4 mg/dL (ref 0.3–1.2)
Total Protein: 6.4 g/dL — ABNORMAL LOW (ref 6.5–8.1)

## 2022-01-03 LAB — HEMOGLOBIN A1C
Hgb A1c MFr Bld: 5.3 % (ref 4.8–5.6)
Mean Plasma Glucose: 105.41 mg/dL

## 2022-01-03 LAB — ABO/RH: ABO/RH(D): A POS

## 2022-01-03 LAB — GLUCOSE, CAPILLARY
Glucose-Capillary: 147 mg/dL — ABNORMAL HIGH (ref 70–99)
Glucose-Capillary: 101 mg/dL — ABNORMAL HIGH (ref 70–99)

## 2022-01-03 LAB — PROTIME-INR
INR: 1.5 — ABNORMAL HIGH (ref 0.8–1.2)
Prothrombin Time: 17.8 seconds — ABNORMAL HIGH (ref 11.4–15.2)

## 2022-01-03 LAB — MAGNESIUM: Magnesium: 1.9 mg/dL (ref 1.7–2.4)

## 2022-01-03 LAB — PHOSPHORUS: Phosphorus: 3.4 mg/dL (ref 2.5–4.6)

## 2022-01-03 LAB — CK: Total CK: 509 U/L — ABNORMAL HIGH (ref 38–234)

## 2022-01-03 MED ORDER — MUPIROCIN 2 % EX OINT
1.0000 "application " | TOPICAL_OINTMENT | Freq: Two times a day (BID) | CUTANEOUS | Status: DC
  Filled 2022-01-03: qty 22

## 2022-01-03 MED ORDER — HYDROMORPHONE HCL 1 MG/ML IJ SOLN
1.0000 mg | Freq: Once | INTRAMUSCULAR | Status: AC
  Administered 2022-01-03: 1 mg via INTRAVENOUS
  Filled 2022-01-03: qty 1

## 2022-01-03 MED ORDER — POTASSIUM CHLORIDE 10 MEQ/100ML IV SOLN
10.0000 meq | INTRAVENOUS | Status: AC
  Administered 2022-01-03 – 2022-01-04 (×2): 10 meq via INTRAVENOUS
  Filled 2022-01-03 (×2): qty 100

## 2022-01-03 MED ORDER — MORPHINE SULFATE (PF) 2 MG/ML IV SOLN: 0.5000 mg | INTRAVENOUS | Status: DC | PRN

## 2022-01-03 MED ORDER — ONDANSETRON HCL 4 MG/2ML IJ SOLN
4.0000 mg | Freq: Once | INTRAMUSCULAR | Status: AC
  Administered 2022-01-03: 4 mg via INTRAVENOUS
  Filled 2022-01-03: qty 2

## 2022-01-03 MED ORDER — HYDROCODONE-ACETAMINOPHEN 5-325 MG PO TABS
1.0000 | ORAL_TABLET | Freq: Four times a day (QID) | ORAL | Status: DC | PRN
  Administered 2022-01-04: 2 via ORAL
  Filled 2022-01-03: qty 2

## 2022-01-03 MED ORDER — HYDROMORPHONE HCL 1 MG/ML IJ SOLN
0.5000 mg | INTRAMUSCULAR | Status: DC | PRN
  Administered 2022-01-03 – 2022-01-04 (×2): 1 mg via INTRAVENOUS
  Filled 2022-01-03 (×2): qty 1

## 2022-01-03 MED ORDER — POTASSIUM CHLORIDE 10 MEQ/100ML IV SOLN: 10.0000 meq | INTRAVENOUS | Status: AC

## 2022-01-03 MED ORDER — METHOCARBAMOL 1000 MG/10ML IJ SOLN
500.0000 mg | Freq: Four times a day (QID) | INTRAVENOUS | Status: DC | PRN
  Filled 2022-01-03: qty 5

## 2022-01-03 MED ORDER — INSULIN ASPART 100 UNIT/ML IJ SOLN
0.0000 [IU] | INTRAMUSCULAR | Status: DC
  Administered 2022-01-04 – 2022-01-06 (×2): 2 [IU] via SUBCUTANEOUS

## 2022-01-03 MED ORDER — IOHEXOL 300 MG/ML  SOLN
100.0000 mL | Freq: Once | INTRAMUSCULAR | Status: AC | PRN
  Administered 2022-01-03: 100 mL via INTRAVENOUS

## 2022-01-03 MED ORDER — METHOCARBAMOL 500 MG PO TABS: 500.0000 mg | ORAL_TABLET | Freq: Four times a day (QID) | ORAL | Status: DC | PRN

## 2022-01-03 MED ORDER — LIDOCAINE-EPINEPHRINE (PF) 2 %-1:200000 IJ SOLN
20.0000 mL | Freq: Once | INTRAMUSCULAR | Status: AC
  Administered 2022-01-03: 20 mL
  Filled 2022-01-03: qty 20

## 2022-01-03 NOTE — ED Triage Notes (Signed)
"  [place] movie theater and went to bathroom and slipped and hurt my right leg/knee" per patient. Swelling noted. IV established by ems in route. 585ml NS bolus and fentanyl IV given.

## 2022-01-03 NOTE — Assessment & Plan Note (Signed)
Cont PPI

## 2022-01-03 NOTE — Assessment & Plan Note (Addendum)
Orthopedics are aware Plan to take to OR when medically cleared Plain imaging ? Pathologic fracture CT ordered Defer to orthopedics Pt is at least moderate risk  will correct  electrolytes prior to OR

## 2022-01-03 NOTE — Progress Notes (Signed)
Orthopedic Tech Progress Note Patient Details:  Laura Bush [birth year] [id number]  Musculoskeletal Traction Type of Traction: Bucks Skin Traction Traction Location: rle Traction Weight: 10 lbs   Post Interventions Patient Tolerated: Well Instructions Provided: Care of device, Adjustment of device  [name] 01/03/2022, 11:40 PM

## 2022-01-03 NOTE — Assessment & Plan Note (Signed)
Continue home CPAP ?

## 2022-01-03 NOTE — ED Notes (Signed)
Attempted to call CT to notify that pt is ready for scan.

## 2022-01-03 NOTE — Assessment & Plan Note (Signed)
-   Order Sensitive SSI  °  ° -  check TSH and HgA1C °  ° ° °

## 2022-01-03 NOTE — Assessment & Plan Note (Signed)
Restart Zocor when able to tolerate

## 2022-01-03 NOTE — ED Provider Notes (Addendum)
[facility] EMERGENCY DEPARTMENT Provider Note   CSN: [id number] Arrival date & time: 01/03/22  1733     History  Chief Complaint  Patient presents with   Leg Injury   Fall    Laura Bush is a 71 y.o. female.  Pt s/p slip and fall in [place] bathroom just pta today - EMS was called - EMS gave fentanyl 200 mcg in route for suspected right femur fracture. Pt also w laceration to left eyebrow. Last tetanus unknown. Denies faintness or dizziness prior to fall. No loc w fall. No headache. No neck or back pain. No chest pain or sob. No abd pain or nv. No numbness/weakness. Denies other extremity pain. No anticoag use.   The history is provided by the patient, medical records and the EMS personnel.      Home Medications Prior to Admission medications   Medication Sig Start Date End Date Taking? Authorizing Provider  aMILoride (MIDAMOR) 5 MG tablet Take 1 tablet (5 mg total) by mouth daily. Patient taking differently: Take 5 mg by mouth in the morning. 04/07/21  Yes Copland, Gay Filler, MD  aspirin EC 81 MG tablet Take 81 mg by mouth daily. Swallow whole.   Yes [provider]  B Complex-C (SUPER B COMPLEX PO) Take 1 tablet by mouth daily with breakfast.   Yes [provider]  Calcium Carb-Cholecalciferol (CALCIUM + D3 PO) Take 1 tablet by mouth daily.   Yes [provider]  fexofenadine (ALLEGRA) 180 MG tablet Take 180 mg by mouth in the morning.   Yes [provider]  fluticasone (CUTIVATE) 0.05 % cream Apply 1 application. topically See admin instructions. Apply to dry area of the left thigh once a day   Yes [provider]  hydrochlorothiazide (HYDRODIURIL) 25 MG tablet Take 1 tablet (25 mg total) by mouth daily. 10/19/21  Yes Copland, Gay Filler, MD  ipratropium (ATROVENT) 0.06 % nasal spray Place 2 sprays into both nostrils 4 (four) times daily. Use as needed Patient taking differently: Place 2 sprays into  both nostrils 4 (four) times daily as needed for rhinitis. 04/07/21  Yes Copland, Gay Filler, MD  naproxen (NAPROSYN) 500 MG tablet Take 1 tablet (500 mg total) by mouth 2 (two) times daily with a meal. 12/29/21  Yes Caleen Jobs B, NP  NIFEdipine (PROCARDIA-XL/NIFEDICAL-XL) 30 MG 24 hr tablet Take 1 tablet (30 mg total) by mouth daily. 11/05/21  Yes Copland, Gay Filler, MD  pantoprazole (PROTONIX) 40 MG tablet Take 1 tablet (40 mg total) by mouth daily. Patient taking differently: Take 40 mg by mouth daily before breakfast. 07/14/21  Yes Copland, Gay Filler, MD  Semaglutide, 2 MG/DOSE, (OZEMPIC, 2 MG/DOSE,) 8 MG/3ML SOPN Inject 2 mg into the skin once a week. Patient taking differently: Inject 2 mg into the skin every Saturday. 12/15/21  Yes Opalski, Neoma Laming, DO  simvastatin (ZOCOR) 20 MG tablet Take 1 tablet (20 mg total) by mouth daily. Patient taking differently: Take 20 mg by mouth every evening. 07/14/21  Yes Copland, Gay Filler, MD  telmisartan (MICARDIS) 80 MG tablet Take 1 tablet (80 mg total) by mouth daily. 07/14/21  Yes Copland, Gay Filler, MD      Allergies    Diclofenac, Amlodipine, Lanolin, and Tape    Review of Systems   Review of Systems  Constitutional:  Negative for fever.  HENT:  Negative for mouth sores.   Eyes:  Negative for visual disturbance.  Respiratory:  Negative for shortness  of breath.   Cardiovascular:  Negative for chest pain.  Gastrointestinal:  Negative for abdominal pain and vomiting.  Genitourinary:  Negative for flank pain.  Musculoskeletal:  Negative for back pain and neck pain.  Skin:  Positive for wound.  Neurological:  Negative for weakness, numbness and headaches.  Hematological:  Does not bruise/bleed easily.  Psychiatric/Behavioral:  Negative for confusion.    Physical Exam Updated Vital Signs BP (!) 118/58   Pulse 77   Temp 98 F (36.7 C) (Oral)   Resp 17   Ht 1.473 m (4\' 10" )   Wt 74.8 kg   SpO2 97%   BMI 34.49 kg/m  Physical Exam Vitals and  nursing note reviewed.  Constitutional:      Appearance: Normal appearance. She is well-developed.  HENT:     Head:     Comments: Contusion/lac left eyebrow.    Nose: Nose normal.     Mouth/Throat:     Mouth: Mucous membranes are moist.  Eyes:     General: No scleral icterus.    Conjunctiva/sclera: Conjunctivae normal.     Pupils: Pupils are equal, round, and reactive to light.  Neck:     Trachea: No tracheal deviation.  Cardiovascular:     Rate and Rhythm: Normal rate and regular rhythm.     Pulses: Normal pulses.     Heart sounds: Normal heart sounds. No murmur heard.   No friction rub. No gallop.  Pulmonary:     Effort: Pulmonary effort is normal. No respiratory distress.     Breath sounds: Normal breath sounds.  Abdominal:     General: Bowel sounds are normal. There is no distension.     Palpations: Abdomen is soft.     Tenderness: There is no abdominal tenderness.  Genitourinary:    Comments: No cva tenderness.  Musculoskeletal:        General: No swelling.     Cervical back: Normal range of motion and neck supple. No rigidity. No muscular tenderness.     Comments: CTLS spine, non tender, aligned, no step off. Deformity/sts right thigh. Compartments of thigh and leg soft, not tense. Distal pulses palp. No other focal pain or bony tenderness noted.   Skin:    General: Skin is warm and dry.     Findings: No rash.  Neurological:     Mental Status: She is alert.     Comments: Alert, speech normal. GCS 15. Motor/sens grossly intact. RLE nvi.   Psychiatric:        Mood and Affect: Mood normal.    ED Results / Procedures / Treatments   Labs (all labs ordered are listed, but only abnormal results are displayed) Results for orders placed or performed during the hospital encounter of 01/03/22  CBC  Result Value Ref Range   WBC 9.1 4.0 - 10.5 K/uL   RBC 3.83 (L) 3.87 - 5.11 MIL/uL   Hemoglobin 12.8 12.0 - 15.0 g/dL   HCT 36.5 36.0 - 46.0 %   MCV 95.3 80.0 - 100.0 fL    MCH 33.4 26.0 - 34.0 pg   MCHC 35.1 30.0 - 36.0 g/dL   RDW 11.8 11.5 - 15.5 %   Platelets 201 150 - 400 K/uL   nRBC 0.0 0.0 - 0.2 %  Basic metabolic panel  Result Value Ref Range   Sodium 138 135 - 145 mmol/L   Potassium 3.4 (L) 3.5 - 5.1 mmol/L   Chloride 105 98 - 111 mmol/L   CO2 21 (  L) 22 - 32 mmol/L   Glucose, Bld 136 (H) 70 - 99 mg/dL   BUN 16 8 - 23 mg/dL   Creatinine, Ser 1.00 0.44 - 1.00 mg/dL   Calcium 9.4 8.9 - 10.3 mg/dL   GFR, Estimated >60 >60 mL/min   Anion gap 12 5 - 15  Type and screen  Result Value Ref Range   ABO/RH(D) A POS    Antibody Screen NEG    Sample Expiration      01/06/2022,2359 Performed at [facility] [address] [place], [place] [address]    DG Knee 1-2 Views Left  Result Date: 12/29/2021 CLINICAL DATA:  Posterior proximal right knee pain for 2 and half weeks after traveling. Left knee films for comparison EXAM: LEFT KNEE - 1-2 VIEW COMPARISON:  None Available. FINDINGS: Degenerative changes in the medial and lateral compartments of the left knee. Mild joint space loss medially. No fractures or dislocations. No other left knee abnormalities identified IMPRESSION: Degenerative changes in the mediolateral compartments of the left knee. Mild joint space loss medially. No other abnormalities on the left Electronically Signed   By: Dorise Bullion III M.D.   On: 12/29/2021 18:29   US Venous Img Lower Unilateral Right (DVT)  Result Date: 12/14/2021 CLINICAL DATA:  Pain and swelling EXAM: Right LOWER EXTREMITY VENOUS DOPPLER ULTRASOUND TECHNIQUE: Gray-scale sonography with compression, as well as color and duplex ultrasound, were performed to evaluate the deep venous system(s) from the level of the common femoral vein through the popliteal and proximal calf veins. COMPARISON:  None Available. FINDINGS: VENOUS Normal compressibility of the common femoral, superficial femoral, and popliteal veins, as well as the visualized calf veins. Visualized  portions of profunda femoral vein and great saphenous vein unremarkable. No filling defects to suggest DVT on grayscale or color Doppler imaging. Doppler waveforms show normal direction of venous flow, normal respiratory plasticity and response to augmentation. Limited views of the contralateral common femoral vein are unremarkable. OTHER None. Limitations: none IMPRESSION: There is no evidence of deep venous thrombosis in the right lower extremity. Electronically Signed   By: Elmer Picker M.D.   On: 12/14/2021 17:18   DG Chest Port 1 View  Result Date: 01/03/2022 CLINICAL DATA:  [place], right-sided pain EXAM: PORTABLE CHEST 1 VIEW COMPARISON:  None Available. FINDINGS: Single frontal view of the chest demonstrates an unremarkable cardiac silhouette. No acute airspace disease, effusion, or pneumothorax. No acute or destructive bony lesions. IMPRESSION: 1. No acute intrathoracic process. Electronically Signed   By: Randa Ngo M.D.   On: 01/03/2022 18:23   DG Knee Complete 4 Views Right  Result Date: 12/29/2021 CLINICAL DATA:  Knee pain. EXAM: RIGHT KNEE - COMPLETE 4+ VIEW COMPARISON:  None Available. FINDINGS: Tricompartmental degenerative changes in the right knee. Loss of joint space medially. No fractures or dislocations. The patella is high riding on the lateral view but the lateral view was performed in extension. No joint effusion. IMPRESSION: 1. The high riding patella on the lateral view is likely due to the fact that the knee was imaged in extension. Recommend clinical correlation. 2. Tricompartmental degenerative changes. Electronically Signed   By: Dorise Bullion III M.D.   On: 12/29/2021 18:31   DG Femur Min 2 Views Right  Result Date: 01/03/2022 CLINICAL DATA:  [place], pain EXAM: RIGHT FEMUR 2 VIEWS COMPARISON:  12/29/2021 FINDINGS: Frontal and lateral views of the right femur are obtained. There is an oblique mid femoral diaphyseal fracture with valgus angulation  at the fracture  site. One shaft width posterior displacement of the distal fracture fragment. There is a permeative moth-eaten appearance of the lateral cortex of the mid femoral diaphysis at the fracture site, suggesting underlying pathologic fracture. The remainder of the right femur is unremarkable. Stable osteoarthritis within the right knee. Right hip is unremarkable. Diffuse soft tissue swelling. IMPRESSION: 1. Displaced fracture mid right femoral diaphysis as above. There is a permeative moth-eaten appearance of the lateral cortex along the fracture margin, suggesting pathologic fracture in this patient with a known history of breast cancer. Electronically Signed   By: Randa Ngo M.D.   On: 01/03/2022 18:22      EKG None  Radiology DG Chest [facility] 1 View  Result Date: 01/03/2022 CLINICAL DATA:  [place], right-sided pain EXAM: PORTABLE CHEST 1 VIEW COMPARISON:  None Available. FINDINGS: Single frontal view of the chest demonstrates an unremarkable cardiac silhouette. No acute airspace disease, effusion, or pneumothorax. No acute or destructive bony lesions. IMPRESSION: 1. No acute intrathoracic process. Electronically Signed   By: Randa Ngo M.D.   On: 01/03/2022 18:23   DG Femur Min 2 Views Right  Result Date: 01/03/2022 CLINICAL DATA:  [place], pain EXAM: RIGHT FEMUR 2 VIEWS COMPARISON:  12/29/2021 FINDINGS: Frontal and lateral views of the right femur are obtained. There is an oblique mid femoral diaphyseal fracture with valgus angulation at the fracture site. One shaft width posterior displacement of the distal fracture fragment. There is a permeative moth-eaten appearance of the lateral cortex of the mid femoral diaphysis at the fracture site, suggesting underlying pathologic fracture. The remainder of the right femur is unremarkable. Stable osteoarthritis within the right knee. Right hip is unremarkable. Diffuse soft tissue swelling. IMPRESSION: 1. Displaced fracture mid right femoral diaphysis as above.  There is a permeative moth-eaten appearance of the lateral cortex along the fracture margin, suggesting pathologic fracture in this patient with a known history of breast cancer. Electronically Signed   By: Randa Ngo M.D.   On: 01/03/2022 18:22    Procedures .Marland KitchenLaceration Repair  Date/Time: 01/03/2022 8:21 PM Performed by: Lajean Saver, MD Authorized by: Lajean Saver, MD   Consent:    Consent given by:  Patient Anesthesia:    Anesthesia method:  Local infiltration   Local anesthetic:  Lidocaine 2% WITH epi Laceration details:    Location:  Face   Face location:  L eyebrow   Length (cm):  2 Pre-procedure details:    Preparation:  Patient was prepped and draped in usual sterile fashion Exploration:    Imaging outcome: foreign body not noted     Wound exploration: entire depth of wound visualized     Contaminated: no   Treatment:    Area cleansed with:  Saline   Amount of cleaning:  Standard   Irrigation solution:  Sterile saline   Visualized foreign bodies/material removed: no   Skin repair:    Repair method:  Sutures   Suture size:  6-0   Suture material:  Prolene   Suture technique:  Simple interrupted   Number of sutures:  6 Approximation:    Approximation:  Close Repair type:    Repair type:  Simple Post-procedure details:    Dressing:  Antibiotic ointment   Procedure completion:  Tolerated well, no immediate complications    Medications Ordered in ED Medications  insulin aspart (novoLOG) injection 0-9 Units (has no administration in time range)  potassium chloride 10 mEq in 100 mL IVPB (has no administration in time  range)  HYDROmorphone (DILAUDID) injection 1 mg (1 mg Intravenous Given 01/03/22 1821)  ondansetron (ZOFRAN) injection 4 mg (4 mg Intravenous Given 01/03/22 1820)  lidocaine-EPINEPHrine (XYLOCAINE W/EPI) 2 %-1:200000 (PF) injection 20 mL (20 mLs Infiltration Given 01/03/22 2011)    ED Course/ Medical Decision Making/ A&P                            Medical Decision Making Problems Addressed: Closed displaced transverse fracture of shaft of right femur, initial encounter [facility]): acute illness or injury with systemic symptoms that poses a threat to life or bodily functions Facial laceration, initial encounter: acute illness or injury Fall from slip, trip, or stumble, initial encounter: acute illness or injury with systemic symptoms that poses a threat to life or bodily functions History of breast cancer: chronic illness or injury  Amount and/or Complexity of Data Reviewed Independent Historian: EMS External Data Reviewed: notes. Labs: ordered. Decision-making details documented in ED Course. Radiology: ordered and independent interpretation performed. Decision-making details documented in ED Course. ECG/medicine tests: ordered. Discussion of management or test interpretation with external provider(s): Ortho and medicine docs, discussed pt.   Risk Prescription drug management. Parenteral controlled substances. Decision regarding hospitalization.   Iv ns. Continuous pulse ox and cardiac monitoring. Labs ordered/sent. Imaging ordered.   Reviewed nursing notes and prior charts for additional history. External reports reviewed. Additional history from:  Cardiac monitor: sinus rhythm, rate 78.  Labs reviewed/interpreted by me - hgb normal.  Xrays reviewed/interpreted by me - +femur fx. Radiology interpret also was noted to show concern for possible pathologic fx.  Pt notes hx breast ca states had lumpectomy/radiation ~ 7 yrs ago without recurrence since.  Pt has noted atraumatic right thigh area pain in past 3-4 weeks - was using cane, indicates outpatient imaging negative for dvt. Ortho re-consulted to discuss above information.   Dilaudid iv.   Tetanus im.   Ortho consulted. Discussed pt with Dr Delfino Lovett - he indicates potentially can take to [facility] - requests hospitalist admission given femur fx and multiple medical  issues/meds.    Hospitalists consulted for admission.  Discussed pt, admitting medicine requests head ct. Will also have ct femur/fx.  Pt kept npo.  Dilaudid iv for pain.   Pain improved w meds.  Buck's traction ordered.              Final Clinical Impression(s) / ED Diagnoses Final diagnoses:  Fall from slip, trip, or stumble, initial encounter  Closed displaced transverse fracture of shaft of right femur, initial encounter ([place])  History of breast cancer  Facial laceration, initial encounter    Rx / DC Orders ED Discharge Orders     None            Lajean Saver, MD 01/03/22 2025

## 2022-01-03 NOTE — Assessment & Plan Note (Addendum)
Followed by [facility] have not seen oncology here for 1 year instead now sees [place]  Given ? Pathologic fracture will let oncology know pt has been admitted

## 2022-01-03 NOTE — Assessment & Plan Note (Signed)
-   will replace and repeat in AM,  check magnesium level and replace as needed ° °

## 2022-01-03 NOTE — ED Notes (Signed)
ED TO INPATIENT HANDOFF REPORT  ED Nurse Name and Phone #: Odella Aquas, [phone]  S Name/Age/Gender Laura Bush [name] 71 y.o. female Room/Bed: 031C/031C  Code Status   Code Status: Not on file  Home/SNF/Other Home Patient oriented to: self, place, time, and situation Is this baseline? Yes   Triage Complete: Triage complete  Chief Complaint Femur fracture, right [facility]) [S72.91XA]  Triage Note "Was leaving movie theater and went to bathroom and slipped and hurt my right leg/knee" per patient. Swelling noted. IV established by ems in route. 515ml NS bolus and fentanyl IV given.    Allergies Allergies  Allergen Reactions   Diclofenac Hypertension   Amlodipine Swelling and Other (See Comments)    Limbs swell, not the throat   Lanolin Other (See Comments)    Sneezing and watery eyes. Allergic to [facility].   Tape Other (See Comments)    Redness (Bandaids also)    Level of Care/Admitting Diagnosis ED Disposition     ED Disposition  Admit   Condition  --   [place]ond: [place] [[id number]]  Level of Care: Telemetry Medical [104]  May admit patient to [facility] or [facility] if equivalent level of care is available:: No  Covid Evaluation: Asymptomatic - no recent exposure (last 10 days) testing not required  Diagnosis: Femur fracture, right [facility]) [[id number]]  Admitting Physician: Toy Baker [[id number]]  Attending Physician: Toy Baker [[id number]]  Estimated length of stay: past midnight tomorrow  Certification:: I certify this patient will need inpatient services for at least 2 midnights          B Medical/Surgery History Past Medical History:  Diagnosis Date   Asthma    in cold weather   Breast cancer ([place]) 09/18/14   left breast   Breast cancer ([place]) 10/07/14   bx left breast   Breast cancer of upper-outer quadrant of left female breast ([place]) 09/22/2014   Cluster headaches    in her 27's   GERD (gastroesophageal reflux  disease)    Heart murmur    as a child (has outgrown)   High cholesterol    Hypertension    Lower extremity edema    Microscopic colitis    Morbid obesity ([place]) 09/30/2019   Obesity (BMI 30-39.9) 11/11/2020   OSA on CPAP    Personal history of radiation therapy    Pneumonia 2000's X 1   PONV (postoperative nausea and vomiting)    Rosacea    S/P radiation therapy 12/30/14-01/27/15   left breast 50Gy total dose   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"   Swallowing difficulty    Venous insufficiency    Past Surgical History:  Procedure Laterality Date   ABDOMINAL HYSTERECTOMY  1999   APPENDECTOMY  ~ 2006   BREAST BIOPSY Left 10/2014   BREAST LUMPECTOMY Left 2016   BREAST REDUCTION SURGERY Bilateral 11/11/2014   Procedure: Bilateral Breast Reduction;  Surgeon: Crissie Reese, MD;  Location: [place] [facility];  Service: Plastics;  Laterality: Bilateral;   CARPAL TUNNEL RELEASE Bilateral    2 surgeries on right, 1 on left   CESAREAN SECTION  1978; 1980   COLONOSCOPY     FOREARM FRACTURE SURGERY Right 2003 X 3   KNEE ARTHROSCOPY Bilateral    meniscus repair   PARTIAL MASTECTOMY WITH NEEDLE LOCALIZATION AND AXILLARY SENTINEL LYMPH NODE BX Left 11/11/2014   Procedure: LEFT BREAST PARTIAL MASTECTOMY WITH NEEDLE LOCALIZATION TIMES TWO AND LEFT AXILLARY SENTINEL LYMPH NODE Biopsy;  Surgeon:  Fanny Skates, MD;  Location: [facility];  Service: General;  Laterality: Left;   SQUAMOUS CELL CARCINOMA EXCISION  1980's X 1   "nose"   TONSILLECTOMY  ~ [place]  ?1984     A IV Location/Drains/Wounds Patient Lines/Drains/Airways Status     Active Line/Drains/Airways     Name Placement date Placement time Site Days   Peripheral IV 01/03/22 18 G Left Antecubital 01/03/22  1700  Antecubital  less than 1   Incision (Closed) 11/11/14 Breast Bilateral 11/11/14  1634  -- 2610   Incision (Closed) 11/11/14 Axilla Left 11/11/14  --  -- 2610            Intake/Output Last 24 hours No intake or output data  in the 24 hours ending 01/03/22 1945  Labs/Imaging Results for orders placed or performed during the hospital encounter of 01/03/22 (from the past 48 hour(s))  Type and screen     Status: None   Collection Time: 01/03/22  6:23 PM  Result Value Ref Range   ABO/RH(D) A POS    Antibody Screen NEG    Sample Expiration      01/06/2022,2359 Performed at [facility], [place] [address] [place], [place] [address]   CBC     Status: Abnormal   Collection Time: 01/03/22  6:23 PM  Result Value Ref Range   WBC 9.1 4.0 - 10.5 K/uL   RBC 3.83 (L) 3.87 - 5.11 MIL/uL   Hemoglobin 12.8 12.0 - 15.0 g/dL   HCT 36.5 36.0 - 46.0 %   MCV 95.3 80.0 - 100.0 fL   MCH 33.4 26.0 - 34.0 pg   MCHC 35.1 30.0 - 36.0 g/dL   RDW 11.8 11.5 - 15.5 %   Platelets 201 150 - 400 K/uL   nRBC 0.0 0.0 - 0.2 %    Comment: Performed at [facility], [place] [address] [place], [place] [address]  Basic metabolic panel     Status: Abnormal   Collection Time: 01/03/22  6:23 PM  Result Value Ref Range   Sodium 138 135 - 145 mmol/L   Potassium 3.4 (L) 3.5 - 5.1 mmol/L   Chloride 105 98 - 111 mmol/L   CO2 21 (L) 22 - 32 mmol/L   Glucose, Bld 136 (H) 70 - 99 mg/dL    Comment: Glucose reference range applies only to samples taken after fasting for at least 8 hours.   BUN 16 8 - 23 mg/dL   Creatinine, Ser 1.00 0.44 - 1.00 mg/dL   Calcium 9.4 8.9 - 10.3 mg/dL   GFR, Estimated >60 >60 mL/min    Comment: (NOTE) Calculated using the CKD-EPI Creatinine Equation (2021)    Anion gap 12 5 - 15    Comment: Performed at [place] [address], [place], [place] [address]   DG Chest Port 1 View  Result Date: 01/03/2022 CLINICAL DATA:  [place], right-sided pain EXAM: PORTABLE CHEST 1 VIEW COMPARISON:  None Available. FINDINGS: Single frontal view of the chest demonstrates an unremarkable cardiac silhouette. No acute airspace disease, effusion, or pneumothorax. No acute or destructive bony lesions. IMPRESSION: 1. No  acute intrathoracic process. Electronically Signed   By: Randa Ngo M.D.   On: 01/03/2022 18:23   DG Femur Min 2 Views Right  Result Date: 01/03/2022 CLINICAL DATA:  [place], pain EXAM: RIGHT FEMUR 2 VIEWS COMPARISON:  12/29/2021 FINDINGS: Frontal and lateral views of the right femur are obtained. There is  an oblique mid femoral diaphyseal fracture with valgus angulation at the fracture site. One shaft width posterior displacement of the distal fracture fragment. There is a permeative moth-eaten appearance of the lateral cortex of the mid femoral diaphysis at the fracture site, suggesting underlying pathologic fracture. The remainder of the right femur is unremarkable. Stable osteoarthritis within the right knee. Right hip is unremarkable. Diffuse soft tissue swelling. IMPRESSION: 1. Displaced fracture mid right femoral diaphysis as above. There is a permeative moth-eaten appearance of the lateral cortex along the fracture margin, suggesting pathologic fracture in this patient with a known history of breast cancer. Electronically Signed   By: Randa Ngo M.D.   On: 01/03/2022 18:22    Pending Labs Unresulted Labs (From admission, onward)     Start     Ordered   01/04/22 0500  Prealbumin  Tomorrow morning,   R        01/03/22 1918   01/03/22 1920  Hemoglobin A1c  Once,   R       Comments: To assess prior glycemic control    01/03/22 1919   01/03/22 1919  CK  Add-on,   AD        01/03/22 1918   01/03/22 1919  Hepatic function panel  Add-on,   AD       Question:  Release to patient  Answer:  Immediate   01/03/22 1918   01/03/22 1919  Magnesium  Add-on,   AD        01/03/22 1918   01/03/22 1919  Phosphorus  Add-on,   AD        01/03/22 1918   01/03/22 1919  Protime-INR  Once,   R       Question:  Release to patient  Answer:  Immediate   01/03/22 1918   01/03/22 1835  ABO/Rh  Once,   STAT        01/03/22 1835   01/03/22 1825  SARS Coronavirus 2 by RT PCR (hospital order, performed in  [place] hospital lab) *cepheid single result test* Anterior Nasal Swab  (Tier 2 - Symptomatic/Asymptomatic)  Once,   URGENT        01/03/22 1825   Signed and Held  HIV Antibody (routine testing w rflx)  (HIV Antibody (Routine testing w reflex) panel)  Once,   R        Signed and Held   Signed and Held  CBC with Differential  Tomorrow morning,   R        Signed and Held   Signed and Held  Comprehensive metabolic panel  Tomorrow morning,   R        Signed and Held            Vitals/Pain Today's Vitals   01/03/22 1808 01/03/22 1809 01/03/22 1845 01/03/22 1850  BP: (!) 144/45  (!) 118/58   Pulse: 77  77   Resp: 17  17   Temp: 98 F (36.7 C)     TempSrc: Oral     SpO2: 92%  97%   Weight:  74.8 kg    Height:  4\' 10"  (1.473 m)    PainSc:  9   5     Isolation Precautions No active isolations  Medications Medications  insulin aspart (novoLOG) injection 0-9 Units (has no administration in time range)  lidocaine-EPINEPHrine (XYLOCAINE W/EPI) 2 %-1:200000 (PF) injection 20 mL (has no administration in time range)  potassium chloride 10 mEq in 100 mL IVPB (  has no administration in time range)  HYDROmorphone (DILAUDID) injection 1 mg (1 mg Intravenous Given 01/03/22 1821)  ondansetron (ZOFRAN) injection 4 mg (4 mg Intravenous Given 01/03/22 1820)    Mobility non-ambulatory Low fall risk   Focused Assessments Neuro Assessment Handoff:  Swallow screen pass? Yes          Neuro Assessment:   Neuro Checks:      Last Documented NIHSS Modified Score:   Has TPA been given? No If patient is a Neuro Trauma and patient is going to OR before floor call report to [place] nurse: [phone] or [phone]   R Recommendations: See Admitting Provider Note  Report given to:   Additional Notes: Pt is AAOx4. Pt is on room air.

## 2022-01-03 NOTE — H&P (Addendum)
Laura Bush NLG:[id number] DOB: [birth year] DOA: 01/03/2022     PCP: Darreld Mclean, MD   Outpatient Specialists:   CARDS: Dr.Chilchinbito    Oncology [facility] [place]  Dr. Jana Hakim have not seen for the past 1 year GI  Irene Shipper, MD    Patient arrived to ER on 01/03/22 at 1733 Referred by Attending Toy Baker, MD   Patient coming from:    home Lives   With family    Chief Complaint:   Chief Complaint  Patient presents with   Leg Injury   Fall    HPI: Laura Bush is a 71 y.o. female with medical history significant of DM2, HTN, HLD, breast cancer in remission 7 years ago, hemochromatosis, diastolic CHF    Presented with   fall Was at the movies tonight slipped and fell lending on her right leg with severe pain Has medical problems DM2, HTN HLD Not on blood thinners Hx of breast cancer  She drinks wine a glass 3 times a week No tobacco Reports pain in right side of her leg since May 8 US showed no DVT     Regarding pertinent Chronic problems:     Hyperlipidemia -  on statins Zocor Lipid Panel     Component Value Date/Time   CHOL 156 10/05/2020 1059   TRIG 182.0 (H) 10/05/2020 1059   HDL 54.30 10/05/2020 1059   CHOLHDL 3 10/05/2020 1059   VLDL 36.4 10/05/2020 1059   LDLCALC 66 10/05/2020 1059   [place] 70 04/06/2020 1059     HTN on MIcardis, hCTZ, amiloride, Nifedipine   chronic CHF diastolic - last echo 0814 Grade I diastolic dysfunction      DM 2 -  Lab Results  Component Value Date   HGBA1C 5.2 04/06/2020     diet controlled   obesity-   BMI Readings from Last 1 Encounters:  01/03/22 34.49 kg/m      OSA -on nocturnal  CPAP,     Hx of CVA -  with/out residual deficits on Aspirin 81 mg    BReast cancer - in remission     While in ER:    Ordered Right leg  Displaced fracture mid right femoral diaphysis as above. There is a permeative moth-eaten appearance of the lateral cortex along  the fracture margin, suggesting pathologic fracture in this patient with a known history of breast cancer.    CXR -  NON acute    Following Medications were ordered in ER: Medications  insulin aspart (novoLOG) injection 0-9 Units (has no administration in time range)  HYDROmorphone (DILAUDID) injection 1 mg (1 mg Intravenous Given 01/03/22 1821)  ondansetron (ZOFRAN) injection 4 mg (4 mg Intravenous Given 01/03/22 1820)    _______________________________________________________ ER Provider Called:  orthopedics   Dr. Delfino Lovett They Recommend admit to medicine  Will see in   ER   ED Triage Vitals  Enc Vitals Group     BP 01/03/22 1808 (!) 144/45     Pulse Rate 01/03/22 1808 77     Resp 01/03/22 1808 17     Temp 01/03/22 1808 98 F (36.7 C)     Temp Source 01/03/22 1808 Oral     SpO2 01/03/22 1808 92 %     Weight 01/03/22 1809 165 lb (74.8 kg)     Height 01/03/22 1809 4\' 10"  (1.473 m)     Head Circumference --      Peak Flow --  Pain Score 01/03/22 1809 9     Pain Loc --      Pain Edu? --      Excl. in [place]? --   TMAX(24)@     _________________________________________ Significant initial  Findings: Abnormal Labs Reviewed  CBC - Abnormal; Notable for the following components:      Result Value   RBC 3.83 (*)    All other components within normal limits  BASIC METABOLIC PANEL - Abnormal; Notable for the following components:   Potassium 3.4 (*)    CO2 21 (*)    Glucose, Bld 136 (*)    All other components within normal limits      ECG: Ordered    The recent clinical data is shown below. Vitals:   01/03/22 1808 01/03/22 1809 01/03/22 1845  BP: (!) 144/45  (!) 118/58  Pulse: 77  77  Resp: 17  17  Temp: 98 F (36.7 C)    TempSrc: Oral    SpO2: 92%  97%  Weight:  74.8 kg   Height:  4\' 10"  (1.473 m)     WBC     Component Value Date/Time   WBC 9.1 01/03/2022 1823   LYMPHSABS 1.3 12/14/2021 0956   LYMPHSABS 1.6 12/12/2019 1506   LYMPHSABS 1.1 05/29/2017  1002   MONOABS 0.6 12/14/2021 0956   MONOABS 0.5 05/29/2017 1002   EOSABS 0.1 12/14/2021 0956   EOSABS 0.2 12/12/2019 1506   BASOSABS 0.0 12/14/2021 0956   BASOSABS 0.0 12/12/2019 1506   BASOSABS 0.0 05/29/2017 1002       Results for orders placed or performed in visit on 05/28/15  Clostridium Difficile by PCR     Status: None   Collection Time: 05/28/15 11:59 AM   Specimen: Stool  Result Value Ref Range Status   Toxigenic C. Difficile by PCR Not Detected Not Detected Final    Comment: This test is for use only with liquid or soft stools; performance characteristics of other clinical specimen types have not been established.   This assay was performed by Cepheid GeneXpert(R) PCR. The performance characteristics of this assay have been determined by [employer]. Performance characteristics refer to the analytical performance of the test.      _______________________________________________ Hospitalist was called for admission for   Fall from slip, trip, or stumble, initial encounter  Closed displaced transverse fracture of shaft of right femur, initial encounter ([place])    History of breast cancer      The following Work up has been ordered so far:  Orders Placed This Encounter  Procedures   SARS Coronavirus 2 by RT PCR (hospital order, performed in [facility] hospital lab) *cepheid single result test* Anterior Nasal Swab   DG Femur Min 2 Views Right   DG Chest Port 1 View   CBC   Basic metabolic panel   CK   Hepatic function panel   Magnesium   Phosphorus   Prealbumin   Protime-INR   Hemoglobin A1c   Diet NPO time specified   Apply bucks traction   Cardiac monitoring   STAT CBG when hypoglycemia is suspected. If treated, recheck every 15 minutes after each treatment until CBG >/= 70 mg/dl   Refer to Hypoglycemia Protocol Sidebar Report for treatment of CBG < 70 mg/dl   Consult to orthopedic surgery   Consult to hospitalist   Consult to orthopedic  surgery   Consult to orthopedic surgery   EKG 12-Lead   Type and screen  ABO/Rh   Admit to Inpatient (patient's expected length of stay will be greater than 2 midnights or inpatient only procedure)     OTHER Significant initial  Findings:  labs showing:    Recent Labs  Lab 01/03/22 1823  NA 138  K 3.4*  CO2 21*  GLUCOSE 136*  BUN 16  CREATININE 1.00  CALCIUM 9.4    Cr   stable,    Lab Results  Component Value Date   CREATININE 1.00 01/03/2022   CREATININE 0.80 12/14/2021   CREATININE 0.86 09/03/2021    No results for input(s): AST, ALT, ALKPHOS, BILITOT, PROT, ALBUMIN in the last 168 hours. Lab Results  Component Value Date   CALCIUM 9.4 01/03/2022          Plt: Lab Results  Component Value Date   PLT 201 01/03/2022     Recent Labs  Lab 01/03/22 1823  WBC 9.1  HGB 12.8  HCT 36.5  MCV 95.3  PLT 201    HG/HCT  stable,       Component Value Date/Time   HGB 12.8 01/03/2022 1823   HGB 12.9 12/14/2021 0956   HGB 13.0 12/12/2019 1506   HGB 13.0 05/29/2017 1002   HCT 36.5 01/03/2022 1823   HCT 38.9 12/12/2019 1506   HCT 38.2 05/29/2017 1002   MCV 95.3 01/03/2022 1823   MCV 98 (H) 12/12/2019 1506   MCV 98.1 05/29/2017 1002    DM  labs:  HbA1C: No results for input(s): HGBA1C in the last 8760 hours.     CBG (last 3)  No results for input(s): GLUCAP in the last 72 hours.        Cultures: No results found for: [place], [place], CULT, REPTSTATUS   Radiological Exams on Admission: DG Chest Port 1 View  Result Date: 01/03/2022 CLINICAL DATA:  [place], right-sided pain EXAM: PORTABLE CHEST 1 VIEW COMPARISON:  None Available. FINDINGS: Single frontal view of the chest demonstrates an unremarkable cardiac silhouette. No acute airspace disease, effusion, or pneumothorax. No acute or destructive bony lesions. IMPRESSION: 1. No acute intrathoracic process. Electronically Signed   By: Randa Ngo M.D.   On: 01/03/2022 18:23   DG Femur Min 2 Views  Right  Result Date: 01/03/2022 CLINICAL DATA:  [place], pain EXAM: RIGHT FEMUR 2 VIEWS COMPARISON:  12/29/2021 FINDINGS: Frontal and lateral views of the right femur are obtained. There is an oblique mid femoral diaphyseal fracture with valgus angulation at the fracture site. One shaft width posterior displacement of the distal fracture fragment. There is a permeative moth-eaten appearance of the lateral cortex of the mid femoral diaphysis at the fracture site, suggesting underlying pathologic fracture. The remainder of the right femur is unremarkable. Stable osteoarthritis within the right knee. Right hip is unremarkable. Diffuse soft tissue swelling. IMPRESSION: 1. Displaced fracture mid right femoral diaphysis as above. There is a permeative moth-eaten appearance of the lateral cortex along the fracture margin, suggesting pathologic fracture in this patient with a known history of breast cancer. Electronically Signed   By: Randa Ngo M.D.   On: 01/03/2022 18:22   _______________________________________________________________________________________________________ Latest  Blood pressure (!) 118/58, pulse 77, temperature 98 F (36.7 C), temperature source Oral, resp. rate 17, height 4\' 10"  (1.473 m), weight 74.8 kg, SpO2 97 %.   Vitals  labs and radiology finding personally reviewed  Review of Systems:    Pertinent positives include: fall, right leg pain   Constitutional:  No weight loss, night sweats, Fevers, chills, fatigue, weight  loss  HEENT:  No headaches, Difficulty swallowing,Tooth/dental problems,Sore throat,  No sneezing, itching, ear ache, nasal congestion, post nasal drip,  Cardio-vascular:  No chest pain, Orthopnea, PND, anasarca, dizziness, palpitations.no Bilateral lower extremity swelling  GI:  No heartburn, indigestion, abdominal pain, nausea, vomiting, diarrhea, change in bowel habits, loss of appetite, melena, blood in stool, hematemesis Resp:  no shortness of breath at  rest. No dyspnea on exertion, No excess mucus, no productive cough, No non-productive cough, No coughing up of blood.No change in color of mucus.No wheezing. Skin:  no rash or lesions. No jaundice GU:  no dysuria, change in color of urine, no urgency or frequency. No straining to urinate.  No flank pain.  Musculoskeletal:  No joint pain or no joint swelling. No decreased range of motion. No back pain.  Psych:  No change in mood or affect. No depression or anxiety. No memory loss.  Neuro: no localizing neurological complaints, no tingling, no weakness, no double vision, no gait abnormality, no slurred speech, no confusion  All systems reviewed and apart from [place] all are negative _______________________________________________________________________________________________ Past Medical History:   Past Medical History:  Diagnosis Date   Asthma    in cold weather   Breast cancer ([place]) 09/18/14   left breast   Breast cancer ([place]) 10/07/14   bx left breast   Breast cancer of upper-outer quadrant of left female breast ([place]) 09/22/2014   Cluster headaches    in her 27's   GERD (gastroesophageal reflux disease)    Heart murmur    as a child (has outgrown)   High cholesterol    Hypertension    Lower extremity edema    Microscopic colitis    Morbid obesity ([place]) 09/30/2019   Obesity (BMI 30-39.9) 11/11/2020   OSA on CPAP    Personal history of radiation therapy    Pneumonia 2000's X 1   PONV (postoperative nausea and vomiting)    Rosacea    S/P radiation therapy 12/30/14-01/27/15   left breast 50Gy total dose   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"   Swallowing difficulty    Venous insufficiency       Past Surgical History:  Procedure Laterality Date   ABDOMINAL HYSTERECTOMY  1999   APPENDECTOMY  ~ 2006   BREAST BIOPSY Left 10/2014   BREAST LUMPECTOMY Left 2016   BREAST REDUCTION SURGERY Bilateral 11/11/2014   Procedure: Bilateral Breast Reduction;  Surgeon: Crissie Reese, MD;   Location: [facility];  Service: Plastics;  Laterality: Bilateral;   CARPAL TUNNEL RELEASE Bilateral    2 surgeries on right, 1 on left   CESAREAN SECTION  1978; [place] Right 2003 X 3   KNEE ARTHROSCOPY Bilateral    meniscus repair   PARTIAL MASTECTOMY WITH NEEDLE LOCALIZATION AND AXILLARY SENTINEL LYMPH NODE BX Left 11/11/2014   Procedure: LEFT BREAST PARTIAL MASTECTOMY WITH NEEDLE LOCALIZATION TIMES TWO AND LEFT AXILLARY SENTINEL LYMPH NODE Biopsy;  Surgeon: Fanny Skates, MD;  Location: [facility];  Service: General;  Laterality: Left;   SQUAMOUS CELL CARCINOMA EXCISION  1980's X 1   "nose"   TONSILLECTOMY  ~ [place]  ?1984    Social History:  Ambulatory   cane,      reports that she has never smoked. She has never used smokeless tobacco. She reports current alcohol use of about 2.0 standard drinks per week. She reports that she does not use drugs.  Family History:   Family History  Problem Relation Age of Onset   Dementia Mother        Lives in [place]   Hypertension Mother    Hyperlipidemia Mother    Stroke Father        deceased   Heart failure Father    Hypertension Father    Uterine cancer Sister 96   Heart attack Maternal Grandmother    Hypertension Maternal Grandfather    Stroke Paternal Grandfather    Colon cancer Neg Hx    Esophageal cancer Neg Hx    Pancreatic cancer Neg Hx    Stomach cancer Neg Hx    Liver disease Neg Hx    Sleep apnea Neg Hx    ______________________________________________________________________________________________ Allergies: Allergies  Allergen Reactions   Diclofenac Hypertension   Amlodipine Swelling and Other (See Comments)    Limbs swell, not the throat   Lanolin Other (See Comments)    Sneezing and watery eyes. Allergic to [facility].   Tape Other (See Comments)    Redness (Banda[place] also)     Prior to Admission medications   Medication Sig Start Date End Date Taking?  Authorizing Provider  aMILoride (MIDAMOR) 5 MG tablet Take 1 tablet (5 mg total) by mouth daily. Patient taking differently: Take 5 mg by mouth in the morning. 04/07/21  Yes Copland, Gay Filler, MD  aspirin EC 81 MG tablet Take 81 mg by mouth daily. Swallow whole.   Yes [provider]  B Complex-C (SUPER B COMPLEX PO) Take 1 tablet by mouth daily with breakfast.   Yes [provider]  Calcium Carb-Cholecalciferol (CALCIUM + D3 PO) Take 1 tablet by mouth daily.   Yes [provider]  fexofenadine (ALLEGRA) 180 MG tablet Take 180 mg by mouth in the morning.   Yes [provider]  fluticasone (CUTIVATE) 0.05 % cream Apply 1 application. topically See admin instructions. Apply to dry area of the left thigh once a day   Yes [provider]  hydrochlorothiazide (HYDRODIURIL) 25 MG tablet Take 1 tablet (25 mg total) by mouth daily. 10/19/21  Yes Copland, Gay Filler, MD  ipratropium (ATROVENT) 0.06 % nasal spray Place 2 sprays into both nostrils 4 (four) times daily. Use as needed Patient taking differently: Place 2 sprays into both nostrils 4 (four) times daily as needed for rhinitis. 04/07/21  Yes Copland, Gay Filler, MD  naproxen (NAPROSYN) 500 MG tablet Take 1 tablet (500 mg total) by mouth 2 (two) times daily with a meal. 12/29/21  Yes Caleen Jobs B, NP  NIFEdipine (PROCARDIA-XL/NIFEDICAL-XL) 30 MG 24 hr tablet Take 1 tablet (30 mg total) by mouth daily. 11/05/21  Yes Copland, Gay Filler, MD  pantoprazole (PROTONIX) 40 MG tablet Take 1 tablet (40 mg total) by mouth daily. Patient taking differently: Take 40 mg by mouth daily before breakfast. 07/14/21  Yes Copland, Gay Filler, MD  Semaglutide, 2 MG/DOSE, (OZEMPIC, 2 MG/DOSE,) 8 MG/3ML SOPN Inject 2 mg into the skin once a week. Patient taking differently: Inject 2 mg into the skin every Saturday. 12/15/21  Yes Opalski, Neoma Laming, DO  simvastatin (ZOCOR) 20 MG tablet Take 1 tablet (20 mg total) by mouth daily. Patient  taking differently: Take 20 mg by mouth every evening. 07/14/21  Yes Copland, Gay Filler, MD  telmisartan (MICARDIS) 80 MG tablet Take 1 tablet (80 mg total) by mouth daily. 07/14/21  Yes Copland, Gay Filler, MD    ___________________________________________________________________________________________________ Physical Exam:    01/03/2022  6:45 PM 01/03/2022    6:09 PM 01/03/2022    6:08 PM  Vitals with BMI  Height  4\' 10"    Weight  165 lbs   BMI  35.45   Systolic 625  638  Diastolic 58  45  Pulse 77  77     1. General:  in No  Acute distress   Chronically ill   -appearing 2. Psychological: Alert and   Oriented 3. Head/ENT:   Moist   Mucous Membranes                          Head Non traumatic, neck supple                           Poor Dentition 4. SKIN:  decreased Skin turgor,  Skin clean Dry and intact no rash 5. Heart: Regular rate and rhythm no  Murmur, no Rub or gallop 6. Lungs:  no wheezes or crackles   7. Abdomen: Soft,  non-tender, Non distended   obese  bowel sounds present 8. Lower extremities: no clubbing, cyanosis, no  edema 9. Neurologically Grossly intact, moving all 4 extremities equally   10. MSK: Normal range of motion limitted in right leg    Chart has been reviewed  ______________________________________________________________________________________________  Assessment/Plan 71 y.o. female with medical history significant of DM2, HTN, HLD, breast cancer   Admitted for   Fall from slip, trip, or stumble, initial encounter  Closed displaced transverse fracture of shaft of right femur, initial encounter ([place])    History of breast cancer      Present on Admission:  Femur fracture, right ([place])  Essential hypertension  Hypercholesteremia  Hypokalemia  GERD (gastroesophageal reflux disease)     Femur fracture, right ([place]) Orthopedics are aware Plan to take to OR when medically cleared Plain imaging ? Pathologic fracture CT ordered Defer to  orthopedics Pt is at least moderate risk  will correct  electrolytes prior to OR  Essential hypertension Restart home meds when able to tolerate  Hypercholesteremia Restart Zocor when able to tolerate  Malignant 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, estrogen receptor positive ([place]) Followed by [facility] have not seen oncology here for 1 year instead now [place]  Given ? Pathologic fracture will let oncology know pt has been admitted  DM (diabetes mellitus), type 2 ([place])  - Order Sensitive SSI     -  check TSH and HgA1C      Hypokalemia - will replace and repeat in AM,  check magnesium level and replace as needed   GERD (gastroesophageal reflux disease) Cont PPI  OSA on CPAP Continue home CPAP   Other plan as per orders.  DVT prophylaxis:  SCD         Code Status:    Code Status: Not on file FULL CODE   as per patient   I had personally discussed CODE STATUS with patient and family     Family Communication:   Family  at  Bedside  plan of care was discussed   with   Son,  Husband,     Disposition Plan:     likely will need placement for rehabilitation                           Following barriers for discharge:  Electrolytes corrected                             Femur repaired                             Pain controlled with PO medications                                                             Will need to be able to tolerate PO                                                      Will need consultants to evaluate patient prior to discharge                      Consults called:  orthopedics is aware, emailed oncology   Admission status:  ED Disposition     ED Disposition  [place]: [facility] [[id number]]  Level of Care: Telemetry Medical [[id number]]  May admit patient to [facility] or [facility] if equivalent level of care is available:: No   Covid Evaluation: Asymptomatic - no recent exposure (last 10 days) testing not required  Diagnosis: Femur fracture, right [facility]) [[id number]]  Admitting Physician: Toy Baker [[id number]]  Attending Physician: Toy Baker [[id number]]  Estimated length of stay: past midnight tomorrow  Certification:: I certify this patient will need inpatient services for at least 2 midnights            inpatient     I Expect 2 midnight stay secondary to severity of patient's current illness need for inpatient interventions justified by the following:     Severe lab/radiological/exam abnormalities including:    Right femur frx and extensive comorbidities including:    DM2    CHF    malignancy,    That are currently affecting medical management.   I expect  patient to be hospitalized for 2 midnights requiring inpatient medical care.  Patient is at high risk for adverse outcome (such as loss of life or disability) if not treated.  Indication for inpatient stay as follows:    severe pain requiring acute inpatient management,  inability to maintain oral hydration    Need for operative/procedural  intervention     Need for  IV fluids, I IV pain medications     Level of care     tele  For 12H        Josede Cicero 01/03/2022, 9:18 PM    [facility]     after 2 AM please page floor coverage PA If 7AM-7PM, please contact the day team taking care of the patient using [email or web address]   Patient was evaluated in the context of the global COVID-19 pandemic, which necessitated consideration that the patient might be at risk for infection with the SARS-CoV-2 virus that causes COVID-19. Institutional protocols and algorithms that pertain to the evaluation of patients at risk for COVID-19 are in a state of  rapid change based on information released by regulatory bodies including the CDC and federal and state organizations. These policies and algorithms were followed during the patient's  care.

## 2022-01-03 NOTE — Assessment & Plan Note (Signed)
Restart home meds when able to tolerate

## 2022-01-03 NOTE — Subjective & Objective (Signed)
Was at the movies tonight slipped and fell lending on her right leg with severe pain Has medical problems DM2, HTN HLD Not on blood thinners Hx of breast cancer

## 2022-01-04 ENCOUNTER — Encounter (HOSPITAL_COMMUNITY)
Admission: EM | Disposition: A | Discharge status: Home Health | Payer: Self-pay | Source: Home / Self Care | Attending: Internal Medicine

## 2022-01-04 ENCOUNTER — Telehealth (INDEPENDENT_AMBULATORY_CARE_PROVIDER_SITE_OTHER): Payer: Self-pay | Admitting: Bariatrics

## 2022-01-04 ENCOUNTER — Encounter: Payer: Self-pay | Admitting: Family Medicine

## 2022-01-04 ENCOUNTER — Inpatient Hospital Stay (HOSPITAL_COMMUNITY): Payer: Medicare HMO

## 2022-01-04 ENCOUNTER — Inpatient Hospital Stay (HOSPITAL_COMMUNITY): Payer: Medicare HMO | Admitting: Anesthesiology

## 2022-01-04 ENCOUNTER — Encounter (HOSPITAL_COMMUNITY): Payer: Self-pay | Admitting: Internal Medicine

## 2022-01-04 ENCOUNTER — Other Ambulatory Visit: Payer: Self-pay

## 2022-01-04 DIAGNOSIS — S0181XA Laceration without foreign body of other part of head, initial encounter: Secondary | ICD-10-CM | POA: Diagnosis not present

## 2022-01-04 DIAGNOSIS — E119 Type 2 diabetes mellitus without complications: Secondary | ICD-10-CM | POA: Diagnosis not present

## 2022-01-04 DIAGNOSIS — Z9989 Dependence on other enabling machines and devices: Secondary | ICD-10-CM

## 2022-01-04 DIAGNOSIS — G4733 Obstructive sleep apnea (adult) (pediatric): Secondary | ICD-10-CM | POA: Diagnosis not present

## 2022-01-04 DIAGNOSIS — I700 Atherosclerosis of aorta: Secondary | ICD-10-CM

## 2022-01-04 DIAGNOSIS — I2510 Atherosclerotic heart disease of native coronary artery without angina pectoris: Secondary | ICD-10-CM

## 2022-01-04 DIAGNOSIS — M84551A Pathological fracture in neoplastic disease, right femur, initial encounter for fracture: Secondary | ICD-10-CM | POA: Diagnosis not present

## 2022-01-04 DIAGNOSIS — R918 Other nonspecific abnormal finding of lung field: Secondary | ICD-10-CM | POA: Diagnosis not present

## 2022-01-04 DIAGNOSIS — I10 Essential (primary) hypertension: Secondary | ICD-10-CM | POA: Diagnosis not present

## 2022-01-04 DIAGNOSIS — S01112A Laceration without foreign body of left eyelid and periocular area, initial encounter: Secondary | ICD-10-CM

## 2022-01-04 DIAGNOSIS — M84451A Pathological fracture, right femur, initial encounter for fracture: Secondary | ICD-10-CM

## 2022-01-04 HISTORY — PX: BONE BIOPSY: SHX375

## 2022-01-04 HISTORY — PX: FEMUR IM NAIL: SHX1597

## 2022-01-04 LAB — CBC WITH DIFFERENTIAL/PLATELET
Abs Immature Granulocytes: 0.07 10*3/uL (ref 0.00–0.07)
Basophils Absolute: 0 10*3/uL (ref 0.0–0.1)
Basophils Relative: 0 %
Eosinophils Absolute: 0 10*3/uL (ref 0.0–0.5)
Eosinophils Relative: 0 %
HCT: 37.2 % (ref 36.0–46.0)
Hemoglobin: 13.1 g/dL (ref 12.0–15.0)
Immature Granulocytes: 1 %
Lymphocytes Relative: 4 %
Lymphs Abs: 0.4 10*3/uL — ABNORMAL LOW (ref 0.7–4.0)
MCH: 34.2 pg — ABNORMAL HIGH (ref 26.0–34.0)
MCHC: 35.2 g/dL (ref 30.0–36.0)
MCV: 97.1 fL (ref 80.0–100.0)
Monocytes Absolute: 0.4 10*3/uL (ref 0.1–1.0)
Monocytes Relative: 4 %
Neutro Abs: 9.1 10*3/uL — ABNORMAL HIGH (ref 1.7–7.7)
Neutrophils Relative %: 91 %
Platelets: 162 10*3/uL (ref 150–400)
RBC: 3.83 MIL/uL — ABNORMAL LOW (ref 3.87–5.11)
RDW: 12.1 % (ref 11.5–15.5)
WBC: 10 10*3/uL (ref 4.0–10.5)
nRBC: 0 % (ref 0.0–0.2)

## 2022-01-04 LAB — SURGICAL PCR SCREEN
MRSA, PCR: NEGATIVE
Staphylococcus aureus: NEGATIVE

## 2022-01-04 LAB — HIV ANTIBODY (ROUTINE TESTING W REFLEX): HIV Screen 4th Generation wRfx: NONREACTIVE

## 2022-01-04 LAB — COMPREHENSIVE METABOLIC PANEL
ALT: 25 U/L (ref 0–44)
AST: 40 U/L (ref 15–41)
Albumin: 3.7 g/dL (ref 3.5–5.0)
Alkaline Phosphatase: 60 U/L (ref 38–126)
Anion gap: 10 (ref 5–15)
BUN: 12 mg/dL (ref 8–23)
CO2: 24 mmol/L (ref 22–32)
Calcium: 9.2 mg/dL (ref 8.9–10.3)
Chloride: 103 mmol/L (ref 98–111)
Creatinine, Ser: 0.89 mg/dL (ref 0.44–1.00)
GFR, Estimated: >60 mL/min (ref >60)
Glucose, Bld: 116 mg/dL — ABNORMAL HIGH (ref 70–99)
Potassium: 4.1 mmol/L (ref 3.5–5.1)
Sodium: 137 mmol/L (ref 135–145)
Total Bilirubin: 0.6 mg/dL (ref 0.3–1.2)
Total Protein: 6.3 g/dL — ABNORMAL LOW (ref 6.5–8.1)

## 2022-01-04 LAB — SARS CORONAVIRUS 2 BY RT PCR: SARS Coronavirus 2 by RT PCR: NEGATIVE

## 2022-01-04 LAB — GLUCOSE, CAPILLARY
Glucose-Capillary: 99 mg/dL (ref 70–99)
Glucose-Capillary: 107 mg/dL — ABNORMAL HIGH (ref 70–99)
Glucose-Capillary: 107 mg/dL — ABNORMAL HIGH (ref 70–99)
Glucose-Capillary: 138 mg/dL — ABNORMAL HIGH (ref 70–99)
Glucose-Capillary: 162 mg/dL — ABNORMAL HIGH (ref 70–99)

## 2022-01-04 LAB — PREALBUMIN: Prealbumin: 26.9 mg/dL (ref 18–38)

## 2022-01-04 SURGERY — INSERTION, INTRAMEDULLARY ROD, FEMUR
Anesthesia: General | Site: Leg Upper | Laterality: Right

## 2022-01-04 MED ORDER — ONDANSETRON HCL 4 MG PO TABS: 4.0000 mg | ORAL_TABLET | Freq: Four times a day (QID) | ORAL | Status: DC | PRN

## 2022-01-04 MED ORDER — CEFAZOLIN SODIUM-DEXTROSE 2-3 GM-%(50ML) IV SOLR
INTRAVENOUS | Status: DC | PRN
  Administered 2022-01-04: 2 g via INTRAVENOUS

## 2022-01-04 MED ORDER — MENTHOL 3 MG MT LOZG: 1.0000 | LOZENGE | OROMUCOSAL | Status: DC | PRN

## 2022-01-04 MED ORDER — LIDOCAINE 2% (20 MG/ML) 5 ML SYRINGE
INTRAMUSCULAR | Status: AC
  Filled 2022-01-04: qty 5

## 2022-01-04 MED ORDER — DEXAMETHASONE SODIUM PHOSPHATE 10 MG/ML IJ SOLN
INTRAMUSCULAR | Status: AC
  Filled 2022-01-04: qty 1

## 2022-01-04 MED ORDER — PROPOFOL 10 MG/ML IV BOLUS
INTRAVENOUS | Status: DC | PRN
  Administered 2022-01-04: 100 mg via INTRAVENOUS

## 2022-01-04 MED ORDER — CEFAZOLIN SODIUM-DEXTROSE 2-4 GM/100ML-% IV SOLN
INTRAVENOUS | Status: AC
  Filled 2022-01-04: qty 100

## 2022-01-04 MED ORDER — ENOXAPARIN SODIUM 40 MG/0.4ML IJ SOSY
40.0000 mg | PREFILLED_SYRINGE | INTRAMUSCULAR | Status: DC
  Administered 2022-01-05: 40 mg via SUBCUTANEOUS
  Filled 2022-01-04: qty 0.4

## 2022-01-04 MED ORDER — METOCLOPRAMIDE HCL 5 MG/ML IJ SOLN: 5.0000 mg | Freq: Three times a day (TID) | INTRAMUSCULAR | Status: DC | PRN

## 2022-01-04 MED ORDER — PROPOFOL 500 MG/50ML IV EMUL
INTRAVENOUS | Status: DC | PRN
  Administered 2022-01-04: 25 ug/kg/min via INTRAVENOUS

## 2022-01-04 MED ORDER — ACETAMINOPHEN 500 MG PO TABS: 1000.0000 mg | ORAL_TABLET | Freq: Once | ORAL | Status: DC

## 2022-01-04 MED ORDER — FENTANYL CITRATE (PF) 250 MCG/5ML IJ SOLN
INTRAMUSCULAR | Status: AC
  Filled 2022-01-04: qty 5

## 2022-01-04 MED ORDER — VANCOMYCIN HCL 1000 MG IV SOLR
INTRAVENOUS | Status: AC
Start: 2022-01-04 — End: ?
  Filled 2022-01-04: qty 20

## 2022-01-04 MED ORDER — DEXAMETHASONE SODIUM PHOSPHATE 10 MG/ML IJ SOLN
INTRAMUSCULAR | Status: DC | PRN
  Administered 2022-01-04: 10 mg via INTRAVENOUS

## 2022-01-04 MED ORDER — CHLORHEXIDINE GLUCONATE 0.12 % MT SOLN
OROMUCOSAL | Status: AC
  Administered 2022-01-04: 15 mL via OROMUCOSAL
  Filled 2022-01-04: qty 15

## 2022-01-04 MED ORDER — AMISULPRIDE (ANTIEMETIC) 5 MG/2ML IV SOLN: 10.0000 mg | Freq: Once | INTRAVENOUS | Status: DC | PRN

## 2022-01-04 MED ORDER — TOBRAMYCIN SULFATE 1.2 G IJ SOLR
INTRAMUSCULAR | Status: AC
Start: 2022-01-04 — End: ?
  Filled 2022-01-04: qty 1.2

## 2022-01-04 MED ORDER — PANTOPRAZOLE SODIUM 40 MG PO TBEC
40.0000 mg | DELAYED_RELEASE_TABLET | Freq: Every day | ORAL | Status: DC
  Administered 2022-01-04 – 2022-01-07 (×3): 40 mg via ORAL
  Filled 2022-01-04 (×3): qty 1

## 2022-01-04 MED ORDER — CHLORHEXIDINE GLUCONATE 4 % EX LIQD: 60.0000 mL | Freq: Once | CUTANEOUS | Status: DC

## 2022-01-04 MED ORDER — DOCUSATE SODIUM 100 MG PO CAPS
100.0000 mg | ORAL_CAPSULE | Freq: Two times a day (BID) | ORAL | Status: DC
  Administered 2022-01-04 – 2022-01-05 (×3): 100 mg via ORAL
  Filled 2022-01-04 (×3): qty 1

## 2022-01-04 MED ORDER — LIDOCAINE 2% (20 MG/ML) 5 ML SYRINGE
INTRAMUSCULAR | Status: DC | PRN
  Administered 2022-01-04: 60 mg via INTRAVENOUS

## 2022-01-04 MED ORDER — FENTANYL CITRATE (PF) 250 MCG/5ML IJ SOLN
INTRAMUSCULAR | Status: DC | PRN
Start: 2022-01-04 — End: 2022-01-04
  Administered 2022-01-04: 50 ug via INTRAVENOUS
  Administered 2022-01-04: 150 ug via INTRAVENOUS

## 2022-01-04 MED ORDER — MIDAZOLAM HCL 2 MG/2ML IJ SOLN
INTRAMUSCULAR | Status: DC | PRN
  Administered 2022-01-04: 2 mg via INTRAVENOUS

## 2022-01-04 MED ORDER — TRANEXAMIC ACID-NACL 1000-0.7 MG/100ML-% IV SOLN: 1000.0000 mg | INTRAVENOUS | Status: DC

## 2022-01-04 MED ORDER — ROCURONIUM BROMIDE 10 MG/ML (PF) SYRINGE
PREFILLED_SYRINGE | INTRAVENOUS | Status: DC | PRN
  Administered 2022-01-04: 20 mg via INTRAVENOUS
  Administered 2022-01-04: 50 mg via INTRAVENOUS

## 2022-01-04 MED ORDER — FENTANYL CITRATE (PF) 100 MCG/2ML IJ SOLN
INTRAMUSCULAR | Status: AC
  Filled 2022-01-04: qty 2

## 2022-01-04 MED ORDER — PROPOFOL 10 MG/ML IV BOLUS
INTRAVENOUS | Status: AC
  Filled 2022-01-04: qty 20

## 2022-01-04 MED ORDER — ONDANSETRON HCL 4 MG/2ML IJ SOLN
INTRAMUSCULAR | Status: DC | PRN
  Administered 2022-01-04: 4 mg via INTRAVENOUS

## 2022-01-04 MED ORDER — LACTATED RINGERS IV SOLN: INTRAVENOUS | Status: DC

## 2022-01-04 MED ORDER — SUGAMMADEX SODIUM 200 MG/2ML IV SOLN
INTRAVENOUS | Status: DC | PRN
  Administered 2022-01-04: 200 mg via INTRAVENOUS

## 2022-01-04 MED ORDER — PHENYLEPHRINE HCL (PRESSORS) 10 MG/ML IV SOLN
INTRAVENOUS | Status: AC
  Filled 2022-01-04: qty 1

## 2022-01-04 MED ORDER — POVIDONE-IODINE 10 % EX SWAB: 2.0000 "application " | Freq: Once | CUTANEOUS | Status: DC

## 2022-01-04 MED ORDER — 0.9 % SODIUM CHLORIDE (POUR BTL) OPTIME
TOPICAL | Status: DC | PRN
  Administered 2022-01-04: 1000 mL

## 2022-01-04 MED ORDER — ONDANSETRON HCL 4 MG/2ML IJ SOLN
INTRAMUSCULAR | Status: AC
  Filled 2022-01-04: qty 2

## 2022-01-04 MED ORDER — PHENYLEPHRINE HCL (PRESSORS) 10 MG/ML IV SOLN
INTRAVENOUS | Status: AC
Start: 2022-01-04 — End: ?
  Filled 2022-01-04: qty 1

## 2022-01-04 MED ORDER — PHENOL 1.4 % MT LIQD: 1.0000 | OROMUCOSAL | Status: DC | PRN

## 2022-01-04 MED ORDER — ONDANSETRON HCL 4 MG/2ML IJ SOLN: 4.0000 mg | Freq: Four times a day (QID) | INTRAMUSCULAR | Status: DC | PRN

## 2022-01-04 MED ORDER — CHLORHEXIDINE GLUCONATE 0.12 % MT SOLN
15.0000 mL | OROMUCOSAL | Status: AC
  Filled 2022-01-04: qty 15

## 2022-01-04 MED ORDER — CEFAZOLIN SODIUM-DEXTROSE 2-4 GM/100ML-% IV SOLN: 2.0000 g | INTRAVENOUS | Status: DC

## 2022-01-04 MED ORDER — ONDANSETRON HCL 4 MG/2ML IJ SOLN: 4.0000 mg | Freq: Once | INTRAMUSCULAR | Status: DC | PRN

## 2022-01-04 MED ORDER — FENTANYL CITRATE (PF) 100 MCG/2ML IJ SOLN
25.0000 ug | INTRAMUSCULAR | Status: DC | PRN
  Administered 2022-01-04 (×2): 50 ug via INTRAVENOUS

## 2022-01-04 MED ORDER — CEFAZOLIN SODIUM-DEXTROSE 2-4 GM/100ML-% IV SOLN
2.0000 g | Freq: Four times a day (QID) | INTRAVENOUS | Status: AC
  Administered 2022-01-04 – 2022-01-05 (×2): 2 g via INTRAVENOUS
  Filled 2022-01-04 (×2): qty 100

## 2022-01-04 MED ORDER — MIDAZOLAM HCL 2 MG/2ML IJ SOLN
INTRAMUSCULAR | Status: AC
  Filled 2022-01-04: qty 2

## 2022-01-04 MED ORDER — METOCLOPRAMIDE HCL 5 MG PO TABS: 5.0000 mg | ORAL_TABLET | Freq: Three times a day (TID) | ORAL | Status: DC | PRN

## 2022-01-04 MED ORDER — ACETAMINOPHEN 10 MG/ML IV SOLN
1000.0000 mg | Freq: Once | INTRAVENOUS | Status: DC | PRN
  Administered 2022-01-04: 1000 mg via INTRAVENOUS

## 2022-01-04 MED ORDER — PHENYLEPHRINE 80 MCG/ML (10ML) SYRINGE FOR IV PUSH (FOR BLOOD PRESSURE SUPPORT)
PREFILLED_SYRINGE | INTRAVENOUS | Status: DC | PRN
  Administered 2022-01-04 (×2): 160 ug via INTRAVENOUS

## 2022-01-04 SURGICAL SUPPLY — 54 items
BAG COUNTER SPONGE SURGICOUNT (BAG) ×2 IMPLANT
BIT DRILL CALIBRATED 4.3MMX365 (DRILL) IMPLANT
BIT DRILL CROWE PNT TWST 4.5MM (DRILL) IMPLANT
BNDG COHESIVE 6X5 TAN STRL LF (GAUZE/BANDAGES/DRESSINGS) ×1 IMPLANT
BRUSH SCRUB EZ PLAIN DRY (MISCELLANEOUS) ×4 IMPLANT
COVER SURGICAL LIGHT HANDLE (MISCELLANEOUS) ×3 IMPLANT
DRAPE C-ARMOR (DRAPES) ×2 IMPLANT
DRAPE HALF SHEET 40X57 (DRAPES) ×1 IMPLANT
DRAPE ORTHO SPLIT 77X108 STRL (DRAPES) ×2
DRAPE SURG ORHT 6 SPLT 77X108 (DRAPES) ×2 IMPLANT
DRAPE U-SHAPE 47X51 STRL (DRAPES) ×1 IMPLANT
DRESSING MEPILEX FLEX 4X4 (GAUZE/BANDAGES/DRESSINGS) IMPLANT
DRILL CALIBRATED 4.3MMX365 (DRILL) ×2
DRILL CROWE POINT TWIST 4.5MM (DRILL) ×2
DRSG MEPILEX BORDER 4X4 (GAUZE/BANDAGES/DRESSINGS) ×1 IMPLANT
DRSG MEPILEX BORDER 4X8 (GAUZE/BANDAGES/DRESSINGS) ×1 IMPLANT
DRSG MEPILEX FLEX 4X4 (GAUZE/BANDAGES/DRESSINGS) ×6
ELECT REM PT RETURN 9FT ADLT (ELECTROSURGICAL) ×2
ELECTRODE REM PT RTRN 9FT ADLT (ELECTROSURGICAL) ×1 IMPLANT
GLOVE BIO SURGEON STRL SZ7.5 (GLOVE) ×2 IMPLANT
GLOVE BIO SURGEON STRL SZ8 (GLOVE) ×2 IMPLANT
GLOVE BIOGEL PI IND STRL 7.5 (GLOVE) ×1 IMPLANT
GLOVE BIOGEL PI IND STRL 8 (GLOVE) ×1 IMPLANT
GLOVE BIOGEL PI INDICATOR 7.5 (GLOVE) ×1
GLOVE BIOGEL PI INDICATOR 8 (GLOVE) ×1
GLOVE SURG ORTHO LTX SZ7.5 (GLOVE) ×4 IMPLANT
GOWN STRL REUS W/ TWL LRG LVL3 (GOWN DISPOSABLE) ×2 IMPLANT
GOWN STRL REUS W/ TWL XL LVL3 (GOWN DISPOSABLE) ×1 IMPLANT
GOWN STRL REUS W/TWL LRG LVL3 (GOWN DISPOSABLE) ×2
GOWN STRL REUS W/TWL XL LVL3 (GOWN DISPOSABLE) ×2
GUIDEPIN VERSANAIL DSP 3.2X444 (ORTHOPEDIC DISPOSABLE SUPPLIES) ×1 IMPLANT
GUIDEWIRE BALL NOSE 100CM (WIRE) ×1 IMPLANT
KIT BASIN OR (CUSTOM PROCEDURE TRAY) ×2 IMPLANT
KIT TURNOVER KIT B (KITS) ×2 IMPLANT
MANIFOLD NEPTUNE II (INSTRUMENTS) ×1 IMPLANT
NAIL FEM RETRO 10.5X340 (Nail) ×1 IMPLANT
NS IRRIG 1000ML POUR BTL (IV SOLUTION) ×2 IMPLANT
PACK GENERAL/GYN (CUSTOM PROCEDURE TRAY) ×2 IMPLANT
PAD ARMBOARD 7.5X6 YLW CONV (MISCELLANEOUS) ×4 IMPLANT
SCREW CORT TI DBL LEAD 5X50 (Screw) ×1 IMPLANT
SCREW CORT TI DBL LEAD 5X65 (Screw) ×2 IMPLANT
SCREW CORT TI DBLE LEAD 5X28 (Screw) ×2 IMPLANT
STAPLER VISISTAT 35W (STAPLE) ×2 IMPLANT
STOCKINETTE IMPERVIOUS LG (DRAPES) ×1 IMPLANT
SUT ETHILON 2 0 FS 18 (SUTURE) ×3 IMPLANT
SUT VIC AB 0 CT1 27 (SUTURE) ×1
SUT VIC AB 0 CT1 27XBRD ANBCTR (SUTURE) ×1 IMPLANT
SUT VIC AB 1 CT1 27 (SUTURE) ×1
SUT VIC AB 1 CT1 27XBRD ANBCTR (SUTURE) ×1 IMPLANT
SUT VIC AB 2-0 CT1 27 (SUTURE) ×1
SUT VIC AB 2-0 CT1 TAPERPNT 27 (SUTURE) ×1 IMPLANT
TOWEL GREEN STERILE (TOWEL DISPOSABLE) ×4 IMPLANT
TOWEL GREEN STERILE FF (TOWEL DISPOSABLE) ×1 IMPLANT
TUBE EXCHANGE NAIL HUMERAL (TRAUMA) ×1 IMPLANT

## 2022-01-04 NOTE — Assessment & Plan Note (Signed)
--  s/p Prolene sutures x6 in ED 5/29 --remove sutures in 5-7 days 6/3-6/5

## 2022-01-04 NOTE — Consult Note (Signed)
Orthopaedic Trauma Service (OTS) Consultation   Patient ID: Laura Bush MRN: [id number] DOB/AGE: 71-May-1952 71 y.o.   Reason for Consult: Right femoral shaft pathological fracture Referring Physician: Leonie Man, MD; Rod Can, MD  HPI: Laura Bush is an 71 y.o. 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 Also a noted ductal cell in contralateral breast on path specimen following reduction. Denies other injury or associated symptoms. Now uses a cane. CT scan head abd pelvis shows 1.7 x 1.2 lesion and nearby 1.0 cm lesion concerning for primary bronchogenic malignancy vs met and will require a PET CT biopsy. Pain is currently well controlled in traction, aching and dull, sharp and severe with motion, without associated distal tingling or numbness, and improved with traction. Also h/o ministroke. Given the location of the femoral shaft fracture and complexity of her clinical picture with suspected pathologic etiology, Dr. Rod Can asserted this was outside his scope of practice and that it would be in the best interest of the patient to have these injuries evaluated and treated by a fellowship trained orthopaedic traumatologist. Consequently, I was consulted to provide further evaluation and management.   Past Medical History:  Diagnosis Date   Asthma    in cold weather   Breast cancer ([facility]) 09/18/14   left breast   Breast cancer ([facility]) 10/07/14   bx left breast   Breast cancer of upper-outer quadrant of left female breast ([facility]) 09/22/2014   Cluster headaches    in her 15's   GERD (gastroesophageal reflux disease)    Heart murmur    as a child (has outgrown)   High cholesterol    Hypertension    Lower extremity edema    Microscopic colitis    Morbid obesity ([facility]) 09/30/2019   Obesity (BMI 30-39.9) 11/11/2020   OSA on CPAP    Personal history  of radiation therapy    Pneumonia 2000's X 1   PONV (postoperative nausea and vomiting)    Rosacea    S/P radiation therapy 12/30/14-01/27/15   left breast 50Gy total dose   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"   Swallowing difficulty    Venous insufficiency     Past Surgical History:  Procedure Laterality Date   ABDOMINAL HYSTERECTOMY  1999   APPENDECTOMY  ~ 2006   BREAST BIOPSY Left 10/2014   BREAST LUMPECTOMY Left 2016   BREAST REDUCTION SURGERY Bilateral 11/11/2014   Procedure: Bilateral Breast Reduction;  Surgeon: Crissie Reese, MD;  Location: [facility];  Service: Plastics;  Laterality: Bilateral;   CARPAL TUNNEL RELEASE Bilateral    2 surgeries on right, 1 on left   CESAREAN SECTION  1978; 1980   COLONOSCOPY     FOREARM FRACTURE SURGERY Right 2003 X 3   KNEE ARTHROSCOPY Bilateral    meniscus repair   PARTIAL MASTECTOMY WITH NEEDLE LOCALIZATION AND AXILLARY SENTINEL LYMPH NODE BX Left 11/11/2014   Procedure: LEFT BREAST PARTIAL MASTECTOMY WITH NEEDLE LOCALIZATION TIMES TWO AND LEFT AXILLARY SENTINEL LYMPH NODE Biopsy;  Surgeon: Fanny Skates, MD;  Location: [facility];  Service: General;  Laterality: Left;   SQUAMOUS CELL CARCINOMA EXCISION  1980's X 1   "nose"   TONSILLECTOMY  ~ 1957   TUBAL LIGATION  ?1984    Family History  Problem Relation Age of Onset   Dementia Mother        Lives  in [place]   Hypertension Mother    Hyperlipidemia Mother    Stroke Father        deceased   Heart failure Father    Hypertension Father    Uterine cancer Sister 85   Heart attack Maternal Grandmother    Hypertension Maternal Grandfather    Stroke Paternal Grandfather    Colon cancer Neg Hx    Esophageal cancer Neg Hx    Pancreatic cancer Neg Hx    Stomach cancer Neg Hx    Liver disease Neg Hx    Sleep apnea Neg Hx     Social History:  reports that she has never smoked. She has never used smokeless tobacco. She reports current alcohol use of about 2.0 standard drinks per week. She reports  that she does not use drugs.  Allergies:  Allergies  Allergen Reactions   Diclofenac Hypertension   Amlodipine Swelling and Other (See Comments)    Limbs swell, not the throat   Lanolin Other (See Comments)    Sneezing and watery eyes. Allergic to [facility].   Tape Other (See Comments)    Redness (Bandaids also)    Medications: Prior to Admission:  Medications Prior to Admission  Medication Sig Dispense Refill Last Dose   aMILoride (MIDAMOR) 5 MG tablet Take 1 tablet (5 mg total) by mouth daily. (Patient taking differently: Take 5 mg by mouth in the morning.) 90 tablet 3 01/03/2022 at am   aspirin EC 81 MG tablet Take 81 mg by mouth daily. Swallow whole.   01/03/2022 at 0900   B Complex-C (SUPER B COMPLEX PO) Take 1 tablet by mouth daily with breakfast.   01/02/2022 at am   Calcium Carb-Cholecalciferol (CALCIUM + D3 PO) Take 1 tablet by mouth daily.   01/02/2022 at am   fexofenadine (ALLEGRA) 180 MG tablet Take 180 mg by mouth in the morning.   01/03/2022 at am   fluticasone (CUTIVATE) 0.05 % cream Apply 1 application. topically See admin instructions. Apply to dry area of the left thigh once a day   01/02/2022   hydrochlorothiazide (HYDRODIURIL) 25 MG tablet Take 1 tablet (25 mg total) by mouth daily. 90 tablet 3 01/03/2022 at am   ipratropium (ATROVENT) 0.06 % nasal spray Place 2 sprays into both nostrils 4 (four) times daily. Use as needed (Patient taking differently: Place 2 sprays into both nostrils 4 (four) times daily as needed for rhinitis.) 15 mL 4 unk   naproxen (NAPROSYN) 500 MG tablet Take 1 tablet (500 mg total) by mouth 2 (two) times daily with a meal. 60 tablet 1 01/03/2022 at am   NIFEdipine (PROCARDIA-XL/NIFEDICAL-XL) 30 MG 24 hr tablet Take 1 tablet (30 mg total) by mouth daily. 30 tablet 3 01/03/2022 at am   pantoprazole (PROTONIX) 40 MG tablet Take 1 tablet (40 mg total) by mouth daily. (Patient taking differently: Take 40 mg by mouth daily before breakfast.) 90 tablet 3 01/02/2022 at  am   Semaglutide, 2 MG/DOSE, (OZEMPIC, 2 MG/DOSE,) 8 MG/3ML SOPN Inject 2 mg into the skin once a week. (Patient taking differently: Inject 2 mg into the skin every Saturday.) 9 mL 0 01/01/2022   simvastatin (ZOCOR) 20 MG tablet Take 1 tablet (20 mg total) by mouth daily. (Patient taking differently: Take 20 mg by mouth every evening.) 90 tablet 3 01/02/2022 at pm   telmisartan (MICARDIS) 80 MG tablet Take 1 tablet (80 mg total) by mouth daily. 90 tablet 3 01/03/2022 at am    Results for  orders placed or performed during the hospital encounter of 01/03/22 (from the past 48 hour(s))  Type and screen     Status: None   Collection Time: 01/03/22  6:23 PM  Result Value Ref Range   ABO/RH(D) A POS    Antibody Screen NEG    Sample Expiration      01/06/2022,2359 Performed at [facility], [place] [address], [place], [place] [address]   CBC     Status: Abnormal   Collection Time: 01/03/22  6:23 PM  Result Value Ref Range   WBC 9.1 4.0 - 10.5 K/uL   RBC 3.83 (L) 3.87 - 5.11 MIL/uL   Hemoglobin 12.8 12.0 - 15.0 g/dL   HCT 36.5 36.0 - 46.0 %   MCV 95.3 80.0 - 100.0 fL   MCH 33.4 26.0 - 34.0 pg   MCHC 35.1 30.0 - 36.0 g/dL   RDW 11.8 11.5 - 15.5 %   Platelets 201 150 - 400 K/uL   nRBC 0.0 0.0 - 0.2 %    Comment: Performed at [facility], [place] [address], [place], [place] [address]  Basic metabolic panel     Status: Abnormal   Collection Time: 01/03/22  6:23 PM  Result Value Ref Range   Sodium 138 135 - 145 mmol/L   Potassium 3.4 (L) 3.5 - 5.1 mmol/L   Chloride 105 98 - 111 mmol/L   CO2 21 (L) 22 - 32 mmol/L   Glucose, Bld 136 (H) 70 - 99 mg/dL    Comment: Glucose reference range applies only to samples taken after fasting for at least 8 hours.   BUN 16 8 - 23 mg/dL   Creatinine, Ser 1.00 0.44 - 1.00 mg/dL   Calcium 9.4 8.9 - 10.3 mg/dL   GFR, Estimated >60 >60 mL/min    Comment: (NOTE) Calculated using the CKD-EPI Creatinine Equation (2021)    Anion gap 12 5 - 15    Comment:  Performed at [facility] [address] [place], [place] [address]  Glucose, capillary     Status: Abnormal   Collection Time: 01/03/22  9:51 PM  Result Value Ref Range   Glucose-Capillary 147 (H) 70 - 99 mg/dL    Comment: Glucose reference range applies only to samples taken after fasting for at least 8 hours.  ABO/Rh     Status: None   Collection Time: 01/03/22 10:22 PM  Result Value Ref Range   ABO/RH(D)      A POS Performed at [facility], [place] [address], [place], [place] [address]   CK     Status: Abnormal   Collection Time: 01/03/22 10:22 PM  Result Value Ref Range   Total CK 509 (H) 38 - 234 U/L    Comment: Performed at [facility], [place] [address] [place], [place] [address]  Hepatic function panel     Status: Abnormal   Collection Time: 01/03/22 10:22 PM  Result Value Ref Range   Total Protein 6.4 (L) 6.5 - 8.1 g/dL   Albumin 3.7 3.5 - 5.0 g/dL   AST 28 15 - 41 U/L   ALT 25 0 - 44 U/L   Alkaline Phosphatase 57 38 - 126 U/L   Total Bilirubin 0.4 0.3 - 1.2 mg/dL   Bilirubin, Direct 0.1 0.0 - 0.2 mg/dL   Indirect Bilirubin 0.3 0.3 - 0.9 mg/dL    Comment: Performed at [facility] [address] [place], [place] [address]  Magnesium     Status: None   Collection Time: 01/03/22 10:22 PM  Result Value Ref Range   Magnesium 1.9 1.7 - 2.4 mg/dL    Comment: Performed at [facility], [place] [address], [place], [place] [address]  Phosphorus     Status: None   Collection Time: 01/03/22 10:22 PM  Result Value Ref Range   Phosphorus 3.4 2.5 - 4.6 mg/dL    Comment: Performed at [facility], [place] [address] [place], [place] [address]  Protime-INR     Status: Abnormal   Collection Time: 01/03/22 10:22 PM  Result Value Ref Range   Prothrombin Time 17.8 (H) 11.4 - 15.2 seconds   INR 1.5 (H) 0.8 - 1.2    Comment: (NOTE) INR goal varies based on device and disease states. Performed at [facility], [place] [address] [place],  [place] [address]   Hemoglobin A1c     Status: None   Collection Time: 01/03/22 10:26 PM  Result Value Ref Range   Hgb A1c MFr Bld 5.3 4.8 - 5.6 %    Comment: (NOTE) Pre diabetes:          5.7%-6.4%  Diabetes:              >6.4%  Glycemic control for   <7.0% adults with diabetes    Mean Plasma Glucose 105.41 mg/dL    Comment: Performed at [facility] [address] [place], [place] [address]  Glucose, capillary     Status: Abnormal   Collection Time: 01/03/22 11:19 PM  Result Value Ref Range   Glucose-Capillary 101 (H) 70 - 99 mg/dL    Comment: Glucose reference range applies only to samples taken after fasting for at least 8 hours.  SARS Coronavirus 2 by RT PCR (hospital order, performed in [facility] hospital lab) *cepheid single result test* Anterior Nasal Swab     Status: None   Collection Time: 01/03/22 11:47 PM   Specimen: Anterior Nasal Swab  Result Value Ref Range   SARS Coronavirus 2 by RT PCR NEGATIVE NEGATIVE    Comment: (NOTE) SARS-CoV-2 target nucleic acids are NOT DETECTED.  The SARS-CoV-2 RNA is generally detectable in upper and lower respiratory specimens during the acute phase of infection. The lowest concentration of SARS-CoV-2 viral copies this assay can detect is 250 copies / mL. A negative result does not preclude SARS-CoV-2 infection and should not be used as the sole basis for treatment or other patient management decisions.  A negative result may occur with improper specimen collection / handling, submission of specimen other than nasopharyngeal swab, presence of viral mutation(s) within the areas targeted by this assay, and inadequate number of viral copies (<250 copies / mL). A negative result must be combined with clinical observations, patient history, and epidemiological information.  Fact Sheet for Patients:   https://www.patel.info/  Fact Sheet for Healthcare Providers: https://hall.com/  This  test is not yet approved or  cleared by the [place] FDA and has been authorized for detection and/or diagnosis of SARS-CoV-2 by FDA under an Emergency Use Authorization (EUA).  This EUA will remain in effect (meaning this test can be used) for the duration of the COVID-19 declaration under Section 564(b)(1) of the Act, 21 U.S.C. section 360bbb-3(b)(1), unless the authorization is terminated or revoked sooner.  Performed at [facility], [place] [address], [place], [place] [address]   Surgical PCR screen     Status: None  Collection Time: 01/03/22 11:47 PM   Specimen: Nasal Mucosa; Nasal Swab  Result Value Ref Range   MRSA, PCR NEGATIVE NEGATIVE   Staphylococcus aureus NEGATIVE NEGATIVE    Comment: (NOTE) The Xpert SA Assay (FDA approved for NASAL specimens in patients 49 years of age and older), is one component of a comprehensive surveillance program. It is not intended to diagnose infection nor to guide or monitor treatment. Performed at [facility], [place] [address] [place], [place] [address]   Glucose, capillary     Status: None   Collection Time: 01/04/22  4:07 AM  Result Value Ref Range   Glucose-Capillary 99 70 - 99 mg/dL    Comment: Glucose reference range applies only to samples taken after fasting for at least 8 hours.  Glucose, capillary     Status: Abnormal   Collection Time: 01/04/22  7:20 AM  Result Value Ref Range   Glucose-Capillary 107 (H) 70 - 99 mg/dL    Comment: Glucose reference range applies only to samples taken after fasting for at least 8 hours.    CT Head Wo Contrast  Result Date: 01/03/2022 CLINICAL DATA:  Lower extremity injury. Fell. Also musculoskeletal neoplasm for staging. There is a 2016 history of partial mastectomy for carcinoma left breast EXAM: CT HEAD WITHOUT CONTRAST CT CHEST, ABDOMEN AND PELVIS WITH CONTRAST TECHNIQUE: Contiguous axial images were obtained from the base of the skull through the vertex without intravenous  contrast. Multidetector CT imaging of the chest, abdomen and pelvis was performed during intravenous contrast administration. RADIATION DOSE REDUCTION: This exam was performed according to the departmental dose-optimization program which includes automated exposure control, adjustment of the mA and/or kV according to patient size and/or use of iterative reconstruction technique. CONTRAST:  117m OMNIPAQUE IOHEXOL 300 MG/ML  SOLN COMPARISON:  No prior body CT for comparison. Only relevant previous study is portable chest x-ray earlier today. FINDINGS: CT HEAD FINDINGS Brain: No evidence of acute infarction, hemorrhage, hydrocephalus, extra-axial collection or mass lesion/mass effect. There is mild cerebrocerebellar atrophy. Vascular: There are scattered calcifications of the carotid siphons but no hyperdense central vasculature. Skull: No fracture or focal bone lesion is seen. Sinuses/Orbits: No acute findings. No mastoid or middle ear effusion. There is membrane thickening in the posterior ethmoid air cells, right sphenoid air cell without fluid levels. Other: None. CT CHEST FINDINGS Cardiovascular: Cardiac size is normal. There are calcifications in the proximal LAD coronary artery. There is no pericardial effusion. There is mild aortic atherosclerosis without aneurysm or dissection. There are mild calcifications and thickening in the aortic valve but no aortic root dilatation. Pulmonary arteries are normal caliber and centrally clear, pulmonary veins are decompressed. Mediastinum/Nodes: The lower poles of the thyroid gland are unremarkable. Axillary spaces are clear. No intrathoracic adenopathy is seen. There is a small hiatal hernia. Thoracic esophagus unremarkable aside from small amounts of scattered retained fluid. There is no tracheal filling defect. Lungs/Pleura: There is a lobular irregular 1.7 x 1.2 cm mass in the left lower lobe posteriorly in the superior segment. There is pleural stranding posterior to  this with small surrounding spiculations. There is a second, ovoid to rounded nodule inferior to this in the posterior basal segment of the left lower lobe measuring 1.0 x 0.8 cm. Findings are worrisome for primary bronchogenic neoplasm with a small intraorgan synchronous primary or intraorgan metastatic lesion versus 2 separate metastases. There is subpleural reticulation in the anterior aspect of the left upper lobe which is probably related to  left breast XRT. The remainder of both lungs are clear. There is no pleural effusion, thickening or pneumothorax. Musculoskeletal: No chest wall mass or suspicious bone lesions identified. There is thoracic spondylosis. There are postsurgical changes in the upper half of the left breast, small foci of fat necrosis and oil cysts anteriorly. CT ABDOMEN AND PELVIS FINDINGS Hepatobiliary: There is a 4 mm too small to characterize hypodensity in the dome of the left lobe of the liver. There is a 1.3 cm cyst of 11.9 Hounsfield units in the left lobe medial segment. The remaining liver is unremarkable. There are 2 small stones in the proximal gallbladder but no wall thickening or biliary dilatation. Pancreas: No mass enhancement or ductal dilatation is seen. Spleen: No mass enhancement or splenomegaly. Adrenals/Urinary Tract: No adrenal mass or renal cortical mass enhancement. There is no urinary stone or obstruction. There is no bladder thickening. Stomach/Bowel: There is mild gastric fluid distention without wall thickening. The unopacified small bowel is unremarkable. An appendix is not seen in this patient. There is moderate stool retention ascending and proximal transverse colon. Left colonic diverticula without evidence of colitis or diverticulitis. Vascular/Lymphatic: There are mild calcific plaques in the infrarenal aorta, common iliac arteries. No aneurysm or stenosis is seen. No adenopathy seen in the abdomen or the pelvis. Reproductive: No mass or other significant  abnormality. Status post hysterectomy. Musculoskeletal: There is prominent facet hypertrophy in the lower lumbar spine most significant at L4-5 and L5-S1, with spurring and osteophytes of the pubic symphysis. No destructive bone lesion is seen. Other: There is no incarcerated hernia. There is no free air, hemorrhage or fluid. IMPRESSION: 1. Irregular 1.7 x 1.2 cm left lower lobe mass, with 1.0 x 0.8 cm nodule inferior to it. The gross appearance is more suggestive of a bronchogenic neoplasm with intralobe synchronous primary or metastasis, but both could be metastatic from an extrathoracic primary in this patient with history of left breast cancer. PET-CT is recommended and tissue sampling will almost certainly be needed. 2. Coronary artery and aortic atherosclerosis. 3. Small hiatal hernia, scattered retained fluid in the otherwise unremarkable esophagus. 4. Subpleural reticulation anteriorly in the left upper lobe most likely due to left breast XRT with evidence of partial left mastectomy. 5. 4 mm too small to characterize hypodensity in the hepatic dome and a small cyst in the left lobe medial segment. 6. No adenopathy or mass enhancement in the abdomen or pelvis. 7. Constipation and diverticulosis. 8. Degenerative changes of the spine without destructive bone lesions. 9. No acute intracranial CT findings or appreciable mass. Mild global atrophy. Electronically Signed   By: Telford Nab M.D.   On: 01/03/2022 23:42   CT FEMUR RIGHT W CONTRAST  Result Date: 01/03/2022 CLINICAL DATA:  Metastatic disease, evaluate for pathologic fracture. Fall. History of breast cancer. EXAM: CT OF THE LOWER RIGHT EXTREMITY WITH CONTRAST TECHNIQUE: Multidetector CT imaging of the lower right extremity was performed according to the standard protocol following intravenous contrast administration. RADIATION DOSE REDUCTION: This exam was performed according to the departmental dose-optimization program which includes automated  exposure control, adjustment of the mA and/or kV according to patient size and/or use of iterative reconstruction technique. CONTRAST:  163m OMNIPAQUE IOHEXOL 300 MG/ML  SOLN COMPARISON:  Right femur x-ray 01/03/2022 FINDINGS: Bones/Joint/Cartilage There is an acute oblique fracture through the mid femoral diaphysis. There is 1 shaft with posterior displacement of the distal fracture fragment with 3 cm of overlap. At the level of the fracture the lateral  cortex of the femur demonstrates heterogeneous lytic areas with surrounding soft tissue calcifications. Ligaments Suboptimally assessed by CT. Muscles and Tendons Grossly within normal limits. Soft tissues No focal hematoma or fluid collection. IMPRESSION: 1. Acute displaced fracture of the mid femur with lytic bone lesion/soft tissue calcifications at this level. Findings are compatible with pathologic fracture. Electronically Signed   By: Ronney Asters M.D.   On: 01/03/2022 23:33   CT CHEST ABDOMEN PELVIS W CONTRAST  Result Date: 01/03/2022 CLINICAL DATA:  Lower extremity injury. Fell. Also musculoskeletal neoplasm for staging. There is a 2016 history of partial mastectomy for carcinoma left breast EXAM: CT HEAD WITHOUT CONTRAST CT CHEST, ABDOMEN AND PELVIS WITH CONTRAST TECHNIQUE: Contiguous axial images were obtained from the base of the skull through the vertex without intravenous contrast. Multidetector CT imaging of the chest, abdomen and pelvis was performed during intravenous contrast administration. RADIATION DOSE REDUCTION: This exam was performed according to the departmental dose-optimization program which includes automated exposure control, adjustment of the mA and/or kV according to patient size and/or use of iterative reconstruction technique. CONTRAST:  1104m OMNIPAQUE IOHEXOL 300 MG/ML  SOLN COMPARISON:  No prior body CT for comparison. Only relevant previous study is portable chest x-ray earlier today. FINDINGS: CT HEAD FINDINGS Brain: No  evidence of acute infarction, hemorrhage, hydrocephalus, extra-axial collection or mass lesion/mass effect. There is mild cerebrocerebellar atrophy. Vascular: There are scattered calcifications of the carotid siphons but no hyperdense central vasculature. Skull: No fracture or focal bone lesion is seen. Sinuses/Orbits: No acute findings. No mastoid or middle ear effusion. There is membrane thickening in the posterior ethmoid air cells, right sphenoid air cell without fluid levels. Other: None. CT CHEST FINDINGS Cardiovascular: Cardiac size is normal. There are calcifications in the proximal LAD coronary artery. There is no pericardial effusion. There is mild aortic atherosclerosis without aneurysm or dissection. There are mild calcifications and thickening in the aortic valve but no aortic root dilatation. Pulmonary arteries are normal caliber and centrally clear, pulmonary veins are decompressed. Mediastinum/Nodes: The lower poles of the thyroid gland are unremarkable. Axillary spaces are clear. No intrathoracic adenopathy is seen. There is a small hiatal hernia. Thoracic esophagus unremarkable aside from small amounts of scattered retained fluid. There is no tracheal filling defect. Lungs/Pleura: There is a lobular irregular 1.7 x 1.2 cm mass in the left lower lobe posteriorly in the superior segment. There is pleural stranding posterior to this with small surrounding spiculations. There is a second, ovoid to rounded nodule inferior to this in the posterior basal segment of the left lower lobe measuring 1.0 x 0.8 cm. Findings are worrisome for primary bronchogenic neoplasm with a small intraorgan synchronous primary or intraorgan metastatic lesion versus 2 separate metastases. There is subpleural reticulation in the anterior aspect of the left upper lobe which is probably related to left breast XRT. The remainder of both lungs are clear. There is no pleural effusion, thickening or pneumothorax. Musculoskeletal: No  chest wall mass or suspicious bone lesions identified. There is thoracic spondylosis. There are postsurgical changes in the upper half of the left breast, small foci of fat necrosis and oil cysts anteriorly. CT ABDOMEN AND PELVIS FINDINGS Hepatobiliary: There is a 4 mm too small to characterize hypodensity in the dome of the left lobe of the liver. There is a 1.3 cm cyst of 11.9 Hounsfield units in the left lobe medial segment. The remaining liver is unremarkable. There are 2 small stones in the proximal gallbladder but no wall thickening  or biliary dilatation. Pancreas: No mass enhancement or ductal dilatation is seen. Spleen: No mass enhancement or splenomegaly. Adrenals/Urinary Tract: No adrenal mass or renal cortical mass enhancement. There is no urinary stone or obstruction. There is no bladder thickening. Stomach/Bowel: There is mild gastric fluid distention without wall thickening. The unopacified small bowel is unremarkable. An appendix is not seen in this patient. There is moderate stool retention ascending and proximal transverse colon. Left colonic diverticula without evidence of colitis or diverticulitis. Vascular/Lymphatic: There are mild calcific plaques in the infrarenal aorta, common iliac arteries. No aneurysm or stenosis is seen. No adenopathy seen in the abdomen or the pelvis. Reproductive: No mass or other significant abnormality. Status post hysterectomy. Musculoskeletal: There is prominent facet hypertrophy in the lower lumbar spine most significant at L4-5 and L5-S1, with spurring and osteophytes of the pubic symphysis. No destructive bone lesion is seen. Other: There is no incarcerated hernia. There is no free air, hemorrhage or fluid. IMPRESSION: 1. Irregular 1.7 x 1.2 cm left lower lobe mass, with 1.0 x 0.8 cm nodule inferior to it. The gross appearance is more suggestive of a bronchogenic neoplasm with intralobe synchronous primary or metastasis, but both could be metastatic from an  extrathoracic primary in this patient with history of left breast cancer. PET-CT is recommended and tissue sampling will almost certainly be needed. 2. Coronary artery and aortic atherosclerosis. 3. Small hiatal hernia, scattered retained fluid in the otherwise unremarkable esophagus. 4. Subpleural reticulation anteriorly in the left upper lobe most likely due to left breast XRT with evidence of partial left mastectomy. 5. 4 mm too small to characterize hypodensity in the hepatic dome and a small cyst in the left lobe medial segment. 6. No adenopathy or mass enhancement in the abdomen or pelvis. 7. Constipation and diverticulosis. 8. Degenerative changes of the spine without destructive bone lesions. 9. No acute intracranial CT findings or appreciable mass. Mild global atrophy. Electronically Signed   By: Telford Nab M.D.   On: 01/03/2022 23:42   DG Chest Port 1 View  Result Date: 01/03/2022 CLINICAL DATA:  [place], right-sided pain EXAM: PORTABLE CHEST 1 VIEW COMPARISON:  None Available. FINDINGS: Single frontal view of the chest demonstrates an unremarkable cardiac silhouette. No acute airspace disease, effusion, or pneumothorax. No acute or destructive bony lesions. IMPRESSION: 1. No acute intrathoracic process. Electronically Signed   By: Randa Ngo M.D.   On: 01/03/2022 18:23   DG Femur Min 2 Views Right  Result Date: 01/03/2022 CLINICAL DATA:  [place], pain EXAM: RIGHT FEMUR 2 VIEWS COMPARISON:  12/29/2021 FINDINGS: Frontal and lateral views of the right femur are obtained. There is an oblique mid femoral diaphyseal fracture with valgus angulation at the fracture site. One shaft width posterior displacement of the distal fracture fragment. There is a permeative moth-eaten appearance of the lateral cortex of the mid femoral diaphysis at the fracture site, suggesting underlying pathologic fracture. The remainder of the right femur is unremarkable. Stable osteoarthritis within the right knee. Right hip  is unremarkable. Diffuse soft tissue swelling. IMPRESSION: 1. Displaced fracture mid right femoral diaphysis as above. There is a permeative moth-eaten appearance of the lateral cortex along the fracture margin, suggesting pathologic fracture in this patient with a known history of breast cancer. Electronically Signed   By: Randa Ngo M.D.   On: 01/03/2022 18:22    Intake/Output      05/29 0701 05/30 0700 05/30 0701 05/31 0700   IV Piggyback 187.4    Total  Intake(mL/kg) 187.4 (2.5)    Net +187.4            ROS No recent fever, bleeding abnormalities, urologic dysfunction, GI problems, or weight gain. Blood pressure (!) 108/56, pulse 89, temperature 97.7 F (36.5 C), temperature source Oral, resp. rate 16, height 4' 10"  (1.473 m), weight 74.8 kg, SpO2 99 %. Physical Exam NCAT, very pleasant Heart RRR No significant wheezing or chest retractions Abd soft, nodistended RLE [place] traction  Edema/ swelling controlled  Sens: DPN, SPN, TN intact  Motor: EHL, FHL, and lessor toe ext and flex all intact grossly  Brisk cap refill, warm to touch Gait: could not assess Coordination and balance: could not assess   Assessment/Plan:  Right femoral shaft fracture, likely pathologic Elevated DVT, PE risk Ministroke history Findings worrisome for left lung primary cancer  PLAN IMN of the right femur today with sending of reamings. PET CT biopsy subsequent possible later today. DVT/ PE/ stroke prophylaxis.  I discussed with the patient the findings on her abdominal CT. The risks and benefits of surgery, including the possibility of infection, nerve injury, vessel injury, wound breakdown, arthritis, symptomatic hardware, DVT/ PE, loss of motion, malunion, nonunion, and need for further surgery among others.  We also specifically discussed the need to stage surgery because of the elevated risk of soft tissue breakdown that could lead to amputation.  He acknowledged these risks and wished to  proceed.    Altamese Ulen, MD [facility], [facility] [phone]  01/04/2022, 8:34 AM  [facility] [place] [id number] [phone] Jenetta [phone] (F)    After 5pm and on the weekends please log on to Amion, go to orthopaedics and the look under the [facility] Call for the provider(s) on call. You can also call our office at [phone] and then follow the prompts to be connected to the call team.

## 2022-01-04 NOTE — Assessment & Plan Note (Addendum)
--  seen on chest CT; follow-up as an outpatient

## 2022-01-04 NOTE — Progress Notes (Addendum)
  Progress Note   Patient: Laura Bush YYT:[id number] DOB: [birth year] DOA: 01/03/2022     1 DOS: the patient was seen and examined on 01/04/2022   Brief hospital course: 29yow PMH left breast cancer status post lumpectomy, XRT 2016, right breast ductal carcinoma in situ; released from oncology. Care 2021, was admitted 5/29 when she slipped in the bathroom and fell resulting in leg pain. Imaging revealed pathologic right femur fx. S/p surgery 5/30 w/ bone biopsy pending. Tentative plan for biopsy LLL via bronchoscopy later this weak. Wants to follow-up with [facility].  Assessment and Plan: * Pathologic fracture of femur ([place]) --s/p low energy impact in patient w/ history of breast cancer; X-rays revealed lytic lesion lateral femoral cortex. CT C/A/P showed lung mass. CT R femur showed pathologic fracture. --s/p surgery 5/30, bone biopsy pending --management per orthopedics  Mass of lower lobe of left lung --Irregular mass, with nodule inferior to it. Gross appearance is more suggestive of a bronchogenic neoplasm with intralobe synchronous primary or metastasis, but both could be metastatic from an extrathoracic primary in this patient with history of left breast cancer.  --PET-CT recommended --per Dr. Irene Limbo, can wait on biopsy of bone; then d/w oncology; if sufficient may not need lung biopsy. Discussed with Dr. Lamonte Sakai, this is amenable to bronchoscopic biopsy, contact pulmonology if this needed --will need outpatient follow-up with [facility] oncology.   DM (diabetes mellitus), type 2 (HCC) --holding Ozempic --SSI, CBG stable  Laceration of left eyebrow --s/p Prolene sutures x6 in ED 5/29 --remove sutures in 5-7 days 6/3-6/5  GERD (gastroesophageal reflux disease) --PPI  Hypercholesteremia --statin  Essential hypertension --stable, continue antihypertensives  CAD (coronary artery disease) --seen on chest CT; follow-up as an outpatient        Subjective:  Feels ok s/p  surgery  Physical Exam: Vitals:   01/04/22 1215 01/04/22 1230 01/04/22 1242 01/04/22 1300  BP: 134/67 119/62 137/69 134/65  Pulse: 65 64 67 74  Resp: 11 14 12 17   Temp:   98.1 F (36.7 C) 97.7 F (36.5 C)  TempSrc:    Oral  SpO2: 100% 100% 100% 100%  Weight:      Height:       Physical Exam Vitals reviewed.  Constitutional:      General: She is not in acute distress.    Appearance: She is not ill-appearing or toxic-appearing.  Cardiovascular:     Rate and Rhythm: Normal rate and regular rhythm.     Heart sounds: No murmur heard. Pulmonary:     Effort: Pulmonary effort is normal. No respiratory distress.     Breath sounds: No wheezing, rhonchi or rales.  Neurological:     Mental Status: She is alert.  Psychiatric:        Mood and Affect: Mood normal.        Behavior: Behavior normal.    Data Reviewed:  CMP noted CBC stable Imaging noted  Family Communication: husband, son at bedside  Disposition: Status is: Inpatient Remains inpatient appropriate because: femur fx s/p surgery today  Planned Discharge Destination:  pending PT eval    Time spent: 35 minutes  Author: Murray Hodgkins, MD 01/04/2022 4:20 PM  For on call review [email or web address].

## 2022-01-04 NOTE — Anesthesia Postprocedure Evaluation (Signed)
Anesthesia Post Note  Patient: Laura Bush  Procedure(s) Performed: RIGHT FEMORAL INTRAMEDULLARY (IM) NAIL (Right: Leg Upper) RIGHT FEMORAL BONE BIOPSY (Right: Leg Upper)     Patient location during evaluation: PACU Anesthesia Type: General Level of consciousness: awake Pain management: pain level controlled Vital Signs Assessment: post-procedure vital signs reviewed and stable Respiratory status: spontaneous breathing, nonlabored ventilation, respiratory function stable and patient connected to nasal cannula oxygen Cardiovascular status: blood pressure returned to baseline and stable Postop Assessment: no apparent nausea or vomiting Anesthetic complications: no   No notable events documented.  Last Vitals:  Vitals:   01/04/22 1735 01/04/22 2033  BP: 134/76 (!) 122/56  Pulse: 74 70  Resp: 19 15  Temp: 37.3 C 37 C  SpO2: 98% 99%    Last Pain:  Vitals:   01/04/22 2033  TempSrc: Oral  PainSc:                  Karyl Kinnier Dayona Shaheen

## 2022-01-04 NOTE — Assessment & Plan Note (Signed)
PPI ?

## 2022-01-04 NOTE — Progress Notes (Signed)
Pt. Has home cpap.

## 2022-01-04 NOTE — Progress Notes (Signed)
Consult received from EDP for R midshaft femur fx after a ground level fall. H/o breast cancer. X-rays reveal lytic lesion lateral femoral cortex. CT CAP shows lung mass. CT R femur shows pathologic fracture. I have discussed the patient with Dr. Marcelino Scot. She is scheduled for open biopsy w frozen section, IM nail R femur for Tuesday am. TRH has admitted. Full consult to follow.

## 2022-01-04 NOTE — Anesthesia Procedure Notes (Signed)
Procedure Name: Intubation Date/Time: 01/04/2022 9:31 AM Performed by: Lavell Luster, CRNA Pre-anesthesia Checklist: Patient identified, Emergency Drugs available, Timeout performed, Patient being monitored and Suction available Patient Re-evaluated:Patient Re-evaluated prior to induction Oxygen Delivery Method: Circle system utilized Preoxygenation: Pre-oxygenation with 100% oxygen Induction Type: IV induction Ventilation: Mask ventilation without difficulty Laryngoscope Size: Mac and 3 Grade View: Grade II Tube type: Oral Tube size: 7.5 mm Number of attempts: 1 Airway Equipment and Method: Stylet Placement Confirmation: ETT inserted through vocal cords under direct vision, positive ETCO2 and breath sounds checked- equal and bilateral Secured at: 21 cm Tube secured with: Tape Dental Injury: Teeth and Oropharynx as per pre-operative assessment

## 2022-01-04 NOTE — Assessment & Plan Note (Signed)
--  stable, continue antihypertensives

## 2022-01-04 NOTE — Assessment & Plan Note (Addendum)
--  Irregular mass, with nodule inferior to it. Gross appearance is more suggestive of a bronchogenic neoplasm with intralobe synchronous primary or metastasis, but both could be metastatic from an extrathoracic primary in this patient with history of left breast cancer.  --PET-CT recommended --per Dr. Irene Limbo, can wait on biopsy of bone; then d/w oncology; if sufficient may not need lung biopsy. Discussed with Dr. Lamonte Sakai, this is amenable to bronchoscopic biopsy, contact pulmonology if this needed --will need outpatient follow-up with [facility] oncology.

## 2022-01-04 NOTE — Transfer of Care (Signed)
Immediate Anesthesia Transfer of Care Note  Patient: Laura Bush  Procedure(s) Performed: RIGHT FEMORAL INTRAMEDULLARY (IM) NAIL (Right: Leg Upper) RIGHT FEMORAL BONE BIOPSY (Right: Leg Upper)  Patient Location: PACU  Anesthesia Type:General  Level of Consciousness: awake and alert   Airway & Oxygen Therapy: Patient Spontanous Breathing and Patient connected to face mask oxygen  Post-op Assessment: Report given to RN and Post -op Vital signs reviewed and stable  Post vital signs: Reviewed and stable  Last Vitals:  Vitals Value Taken Time  BP 125/63 01/04/22   Temp 36.7 C 01/04/22   Pulse 70 01/04/22   Resp 11 01/04/22   SpO2 97 % 01/04/22   Vitals shown include unvalidated device data.  Last Pain:  Vitals:   01/04/22 1145  TempSrc:   PainSc: 6       Patients Stated Pain Goal: 0 ([birth year] 5697)  Complications: No notable events documented.

## 2022-01-04 NOTE — Assessment & Plan Note (Signed)
--  holding Ozempic --SSI, CBG stable

## 2022-01-04 NOTE — Assessment & Plan Note (Signed)
--  statin

## 2022-01-04 NOTE — Anesthesia Preprocedure Evaluation (Addendum)
Anesthesia Evaluation  Patient identified by MRN, date of birth, ID band Patient awake    Reviewed: Allergy & Precautions, NPO status , Patient's Chart, lab work & pertinent test results  History of Anesthesia Complications (+) PONV and history of anesthetic complications  Airway Mallampati: II  TM Distance: >3 FB Neck ROM: Full    Dental no notable dental hx.    Pulmonary asthma , sleep apnea and Continuous Positive Airway Pressure Ventilation ,    Pulmonary exam normal        Cardiovascular hypertension, Pt. on medications Normal cardiovascular exam     Neuro/Psych  Headaches, negative psych ROS   GI/Hepatic GERD  Medicated and Controlled,(+)     substance abuse  ,   Endo/Other  diabetes  Renal/GU negative Renal ROS     Musculoskeletal   Abdominal (+) + obese,   Peds  Hematology negative hematology ROS (+)   Anesthesia Other Findings Pathologic fracture Right Femur  Reproductive/Obstetrics                            Anesthesia Physical Anesthesia Plan  ASA: 3  Anesthesia Plan: General   Post-op Pain Management:    Induction: Intravenous  PONV Risk Score and Plan: 4 or greater and Ondansetron, Aprepitant, Midazolam, Propofol infusion and Treatment may vary due to age or medical condition  Airway Management Planned: Oral ETT  Additional Equipment:   Intra-op Plan:   Post-operative Plan: Extubation in OR  Informed Consent: I have reviewed the patients History and Physical, chart, labs and discussed the procedure including the risks, benefits and alternatives for the proposed anesthesia with the patient or authorized representative who has indicated his/her understanding and acceptance.     Dental advisory given  Plan Discussed with: CRNA  Anesthesia Plan Comments:        Anesthesia Quick Evaluation

## 2022-01-04 NOTE — TOC Initial Note (Signed)
Transition of Care [facility]) - Initial/Assessment Note    Patient Details  Name: Laura Bush MRN: [id number] Date of Birth: Aug 28, 1950  [facility]) CM/SW Contact:    Sharin Mons, RN Phone Number: 01/04/2022, 3:15 PM  Clinical Narrative:        Admitted after a fall, suffered R femoral shaft pathological fracture.    From home with spouse. PTA independent with ADL's, DME: cane, CPAP.     -  IMN R femur, 5/30  Plan:  PET CT biopsy  TOC team following for needs...  Expected Discharge Plan: Home/Self Care Barriers to Discharge: Continued Medical Work up   Patient Goals and CMS Choice        Expected Discharge Plan and Services Expected Discharge Plan: Home/Self Care   Discharge Planning Services: CM Consult   Living arrangements for the past 2 months: Single Family Home                                      Prior Living Arrangements/Services Living arrangements for the past 2 months: Single Family Home Lives with:: Spouse Patient language and need for interpreter reviewed:: Yes Do you feel safe going back to the place where you live?: Yes      Need for Family Participation in Patient Care: Yes (Comment) Care giver support system in place?: Yes (comment) Current home services: DME (cane, CPAP) Criminal Activity/Legal Involvement Pertinent to Current Situation/Hospitalization: No - Comment as needed  Activities of Daily Living Home Assistive Devices/Equipment: Cane (specify quad or straight) ADL Screening (condition at time of admission) Patient's cognitive ability adequate to safely complete daily activities?: Yes Is the patient deaf or have difficulty hearing?: No Does the patient have difficulty seeing, even when wearing glasses/contacts?: No Does the patient have difficulty concentrating, remembering, or making decisions?: No Patient able to express need for assistance with ADLs?: Yes Does the patient have difficulty dressing or  bathing?: Yes Independently performs ADLs?: No Communication: Independent Dressing (OT): Needs assistance Is this a change from baseline?: Change from baseline, expected to last >3 days Grooming: Needs assistance Is this a change from baseline?: Change from baseline, expected to last >3 days Feeding: Independent Bathing: Needs assistance Is this a change from baseline?: Change from baseline, expected to last >3 days Toileting: Needs assistance Is this a change from baseline?: Change from baseline, expected to last >3days In/Out Bed: Needs assistance Is this a change from baseline?: Change from baseline, expected to last >3 days Walks in Home: Needs assistance Is this a change from baseline?: Change from baseline, expected to last >3 days Does the patient have difficulty walking or climbing stairs?: Yes Weakness of Legs: Right Weakness of Arms/Hands: None  Permission Sought/Granted                  Emotional Assessment       Orientation: : Oriented to Self, Oriented to Place, Oriented to  Time, Oriented to Situation Alcohol / Substance Use: Not Applicable Psych Involvement: No (comment)  Admission diagnosis:  History of breast cancer [Z85.3] Femur fracture, right ([place]) [S72.91XA] Facial laceration, initial encounter [S01.81XA] Closed displaced transverse fracture of shaft of right femur, initial encounter ([place]) [S72.321A] Fall from slip, trip, or stumble, initial encounter [W01.0XXA] Patient Active Problem List   Diagnosis Date Noted   Laceration of left eyebrow 01/04/2022   Mass of lower lobe of left lung 01/04/2022  Aortic atherosclerosis ([place]) 01/04/2022   CAD (coronary artery disease) 01/04/2022   Femur fracture, right ([place]) 01/03/2022   DM (diabetes mellitus), type 2 ([place]) 01/03/2022   Hypokalemia 01/03/2022   OSA on CPAP 01/03/2022   Impaired fasting glucose, with polyphagia 12/15/2021   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 Griffith Citron 04/21/2020   Osteopenia 10/02/2018   Malignant 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, estrogen receptor positive ([place]) 05/29/2017   Ductal carcinoma in situ (DCIS) of right breast 05/29/2017   Vitamin D deficiency 01/29/2016   Eczema 12/29/2014   Venous insufficiency 10/16/2014   Essential hypertension 10/01/2014   Hypercholesteremia 10/01/2014   GERD (gastroesophageal reflux disease) 10/01/2014   Other bilateral bundle branch block 06/23/2003   PCP:  Darreld Mclean, MD Pharmacy:   [id number] - [place], [address] [place] [address] Phone: [phone] Fax: [phone]     Social Determinants of Health (SDOH) Interventions    Readmission Risk Interventions     View : No data to display.

## 2022-01-04 NOTE — Consult Note (Signed)
NAME:  Laura Bush, MRN:  [id number], DOB:  29-Oct-1950, LOS: 1 ADMISSION DATE:  01/03/2022, CONSULTATION DATE:  01/04/2022 REFERRING MD: Dr. Sarajane Jews, CHIEF COMPLAINT: Pulmonary nodules  History of Present Illness:  71 year old woman, never smoker, with a history of left breast cancer treated by mastectomy and XRT.  Also with hypertension, diabetes, hyperlipidemia, GERD, TIA. She has been experiencing right lower extremity pain since early May.  She experienced a ground-level fall and a closed displaced transverse fracture of the shaft of the right femur.  Subsequent CT of the right femur and identified a displaced fracture with evidence for lytic bone lesion and soft tissue calcifications consistent with a pathologic fracture.  She is being considered for biopsy and surgical repair by Dr. Marcelino Scot.  She underwent CT scan of the head, chest abdomen and pelvis 01/03/2022 which I have reviewed, shows no intracranial abnormality, a 1.7 x 1.2 cm left lower lobe superior segmental rounded nodule with some spiculation and stranding.  There is a smaller 1.0 x 0.8 cm left lower lobe more inferior pulmonary nodule.  No mediastinal or hilar adenopathy.  Abdominal scan revealed a 4 mm hypodensity in the dome of the left lobe of the liver of unclear significance.    Pertinent  Medical History   Past Medical History:  Diagnosis Date   Asthma    in cold weather   Breast cancer ([place]) 09/18/14   left breast   Breast cancer ([place]) 10/07/14   bx left breast   Breast cancer of upper-outer quadrant of left female breast ([place]) 09/22/2014   Cluster headaches    in her 62's   GERD (gastroesophageal reflux disease)    Heart murmur    as a child (has outgrown)   High cholesterol    Hypertension    Lower extremity edema    Microscopic colitis    Morbid obesity ([place]) 09/30/2019   Obesity (BMI 30-39.9) 11/11/2020   OSA on CPAP    Personal history of radiation therapy    Pneumonia 2000's X 1   PONV  (postoperative nausea and vomiting)    Rosacea    S/P radiation therapy 12/30/14-01/27/15   left breast 50Gy total dose   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 rate 17, height 4\' 10"  (1.473 m), weight 74.8 kg, SpO2 100 %.        Intake/Output Summary (Last 24 hours) at 01/04/2022 1559 Last data filed at 01/04/2022 1500 Gross per 24 hour  Intake 1537.42 ml  Output 810 ml  Net 727.42 ml   Filed Weights   01/03/22 1809 01/04/22 0754  Weight: 74.8 kg 74.8 kg    Examination: General: Tired, no distress HENT: OP clear, linear laceration above the L eye with sutures Lungs: clear B Cardiovascular: RRR no M Abdomen: non-distended,  + BS Extremities: dressings on RLE, 1+ edema Neuro: awake and appropriate, non-focal GU: Not examined  Resolved Hospital Problem list     Assessment & Plan:  Two small left lower lobe rounded pulmonary nodules, concerning for either primary lung cancer versus metastatic disease.  The absence of mediastinal adenopathy would suggest the former. -Discussed the pros and cons of navigational bronchoscopy  versus TTNA versus deferring any kind of nodule biopsy until her right femur bone pathology is available.  It would seem that the only pathology result that would make nodule biopsy unnecessary would be primary lung cancer metastatic to the bone.  The appearance of the nodules, absence of mediastinal adenopathy makes this unlikely.  I think she will ultimately require biopsy of her pulmonary nodules.  We talked about navigational bronchoscopy versus TTNA.  I do believe that navigational bronchoscopy would offer a lower pneumothorax rate.  That said  she would require general anesthesia.  She is going to think about the options, speak with her husband and we will make a definitive plan tomorrow 5/31.  I have tentatively got her scheduled for 01/06/2022 at 7:30 AM.   Labs   CBC: Recent Labs  Lab 01/03/22 1823 01/04/22 1328  WBC 9.1 10.0  NEUTROABS  --  9.1*  HGB 12.8 13.1  HCT 36.5 37.2  MCV 95.3 97.1  PLT 201 789    Basic Metabolic Panel: Recent Labs  Lab 01/03/22 1823 01/03/22 2222 01/04/22 1328  NA 138  --  137  K 3.4*  --  4.1  CL 105  --  103  CO2 21*  --  24  GLUCOSE 136*  --  116*  BUN 16  --  12  CREATININE 1.00  --  0.89  CALCIUM 9.4  --  9.2  MG  --  1.9  --   PHOS  --  3.4  --    GFR: Estimated Creatinine Clearance: 50.6 mL/min (by C-G formula based on SCr of 0.89 mg/dL). Recent Labs  Lab 01/03/22 1823 01/04/22 1328  WBC 9.1 10.0    Liver Function Tests: Recent Labs  Lab 01/03/22 2222 01/04/22 1328  AST 28 40  ALT 25 25  ALKPHOS 57 60  BILITOT 0.4 0.6  PROT 6.4* 6.3*  ALBUMIN 3.7 3.7   No results for input(s): LIPASE, AMYLASE in the last 168 hours. No results for input(s): AMMONIA in the last 168 hours.  ABG No results found for: PHART, PCO2ART, PO2ART, HCO3, TCO2, ACIDBASEDEF, O2SAT   Coagulation Profile: Recent Labs  Lab 01/03/22 2222  INR 1.5*    Cardiac Enzymes: Recent Labs  Lab 01/03/22 2222  CKTOTAL 509*    HbA1C: Hgb A1c MFr Bld  Date/Time Value Ref Range Status  01/03/2022 10:26 PM 5.3 4.8 - 5.6 % Final    Comment:    (NOTE) Pre diabetes:          5.7%-6.4%  Diabetes:              >6.4%  Glycemic control for   <7.0% adults with diabetes   04/06/2020 10:59 AM 5.2 <5.7 % of total Hgb Final    Comment:    For the purpose of screening for the presence of diabetes: . <5.7%       Consistent with the absence of diabetes 5.7-6.4%    Consistent with increased risk for diabetes             (prediabetes) > or =6.5%  Consistent with diabetes . This assay result  is consistent with a decreased risk of diabetes. . Currently, no consensus exists regarding use of hemoglobin A1c for diagnosis of diabetes in children. . According to American Diabetes Association (ADA) guidelines, hemoglobin A1c <7.0% represents optimal control in non-pregnant diabetic patients. Different metrics may apply to specific patient populations.  Standards of Medical Care in Diabetes(ADA). Marland Kitchen  CBG: Recent Labs  Lab 01/03/22 2151 01/03/22 2319 01/04/22 0407 01/04/22 0720 01/04/22 1305  GLUCAP 147* 101* 99 107* 107*    Review of Systems:   As above  Past Medical History:  She,  has a past medical history of Asthma, Breast cancer ([place]) (09/18/14), Breast cancer ([place]) (10/07/14), Breast cancer of upper-outer quadrant of left female breast ([place]) (09/22/2014), Cluster headaches, GERD (gastroesophageal reflux disease), Heart murmur, High cholesterol, Hypertension, Lower extremity edema, Microscopic colitis, Morbid obesity ([place]) (09/30/2019), Obesity (BMI 30-39.9) (11/11/2020), OSA on CPAP, Personal history of radiation therapy, Pneumonia (2000's X 1), PONV (postoperative nausea and vomiting), Rosacea, S/P radiation therapy (12/30/14-01/27/15), Squamous 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   Surgical History:   Past Surgical History:  Procedure Laterality Date   ABDOMINAL HYSTERECTOMY  1999   APPENDECTOMY  ~ 2006   BREAST BIOPSY Left 10/2014   BREAST LUMPECTOMY Left 2016   BREAST REDUCTION SURGERY Bilateral 11/11/2014   Procedure: Bilateral Breast Reduction;  Surgeon: Crissie Reese, MD;  Location: [facility];  Service: Plastics;  Laterality: Bilateral;   CARPAL TUNNEL RELEASE Bilateral    2 surgeries on right, 1 on left   CESAREAN SECTION  1978; [place] Right 2003 X 3   KNEE ARTHROSCOPY Bilateral    meniscus repair   PARTIAL MASTECTOMY WITH NEEDLE LOCALIZATION AND AXILLARY SENTINEL LYMPH NODE BX Left  11/11/2014   Procedure: LEFT BREAST PARTIAL MASTECTOMY WITH NEEDLE LOCALIZATION TIMES TWO AND LEFT AXILLARY SENTINEL LYMPH NODE Biopsy;  Surgeon: Fanny Skates, MD;  Location: [facility];  Service: General;  Laterality: Left;   SQUAMOUS CELL CARCINOMA EXCISION  1980's X 1   "nose"   TONSILLECTOMY  ~ [place]  ?1984     Social History:   reports that she has never smoked. She has never used smokeless tobacco. She reports current alcohol use of about 2.0 standard drinks per week. She reports that she does not use drugs.   Family History:  Her family history includes Dementia in her mother; Heart attack in her maternal grandmother; Heart failure in her father; Hyperlipidemia in her mother; Hypertension in her father, maternal grandfather, and mother; Stroke in her father and paternal grandfather; Uterine cancer (age of onset: 42) in her sister. There is no history of Colon cancer, Esophageal cancer, Pancreatic cancer, Stomach cancer, Liver disease, or Sleep apnea.   Allergies Allergies  Allergen Reactions   Diclofenac Hypertension   Amlodipine Swelling and Other (See Comments)    Limbs swell, not the throat   Lanolin Other (See Comments)    Sneezing and watery eyes. Allergic to [facility].   Tape Other (See Comments)    Redness (Bandaids also)     Home Medications  Prior to Admission medications   Medication Sig Start Date End Date Taking? Authorizing Provider  aMILoride (MIDAMOR) 5 MG tablet Take 1 tablet (5 mg total) by mouth daily. Patient taking differently: Take 5 mg by mouth in the morning. 04/07/21  Yes Copland, Gay Filler, MD  aspirin EC 81 MG tablet Take 81 mg by mouth daily. Swallow whole.   Yes [provider]  B Complex-C (SUPER B COMPLEX PO) Take 1 tablet by mouth daily with breakfast.   Yes [provider]  Calcium Carb-Cholecalciferol (CALCIUM + D3 PO) Take 1 tablet by mouth daily.   Yes [provider]  fexofenadine (ALLEGRA) 180 MG tablet Take  180 mg by mouth in the  morning.   Yes [provider]  fluticasone (CUTIVATE) 0.05 % cream Apply 1 application. topically See admin instructions. Apply to dry area of the left thigh once a day   Yes [provider]  hydrochlorothiazide (HYDRODIURIL) 25 MG tablet Take 1 tablet (25 mg total) by mouth daily. 10/19/21  Yes Copland, Gay Filler, MD  ipratropium (ATROVENT) 0.06 % nasal spray Place 2 sprays into both nostrils 4 (four) times daily. Use as needed Patient taking differently: Place 2 sprays into both nostrils 4 (four) times daily as needed for rhinitis. 04/07/21  Yes Copland, Gay Filler, MD  naproxen (NAPROSYN) 500 MG tablet Take 1 tablet (500 mg total) by mouth 2 (two) times daily with a meal. 12/29/21  Yes Caleen Jobs B, NP  NIFEdipine (PROCARDIA-XL/NIFEDICAL-XL) 30 MG 24 hr tablet Take 1 tablet (30 mg total) by mouth daily. 11/05/21  Yes Copland, Gay Filler, MD  pantoprazole (PROTONIX) 40 MG tablet Take 1 tablet (40 mg total) by mouth daily. Patient taking differently: Take 40 mg by mouth daily before breakfast. 07/14/21  Yes Copland, Gay Filler, MD  Semaglutide, 2 MG/DOSE, (OZEMPIC, 2 MG/DOSE,) 8 MG/3ML SOPN Inject 2 mg into the skin once a week. Patient taking differently: Inject 2 mg into the skin every Saturday. 12/15/21  Yes Opalski, Neoma Laming, DO  simvastatin (ZOCOR) 20 MG tablet Take 1 tablet (20 mg total) by mouth daily. Patient taking differently: Take 20 mg by mouth every evening. 07/14/21  Yes Copland, Gay Filler, MD  telmisartan (MICARDIS) 80 MG tablet Take 1 tablet (80 mg total) by mouth daily. 07/14/21  Yes Copland, Gay Filler, MD     Critical care time: NA     Baltazar Apo, MD, PhD 01/04/2022, 5:04 PM [facility] [phone] or if no answer before 7:00PM call [phone] For any issues after 7:00PM please call eLink [phone]
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
## 2022-01-04 NOTE — Hospital Course (Signed)
75yow PMH left breast cancer status post lumpectomy, XRT 2016, right breast ductal carcinoma in situ; released from oncology. Care 2021, was admitted 5/29 when she slipped in the bathroom and fell resulting in leg pain. Imaging revealed pathologic right femur fx. S/p surgery 5/30 w/ bone biopsy pending. Tentative plan for biopsy LLL via bronchoscopy later this weak. Wants to follow-up with [facility].

## 2022-01-05 ENCOUNTER — Encounter (HOSPITAL_COMMUNITY): Payer: Self-pay | Admitting: Orthopedic Surgery

## 2022-01-05 DIAGNOSIS — M84551A Pathological fracture in neoplastic disease, right femur, initial encounter for fracture: Secondary | ICD-10-CM | POA: Diagnosis not present

## 2022-01-05 DIAGNOSIS — R918 Other nonspecific abnormal finding of lung field: Secondary | ICD-10-CM | POA: Diagnosis not present

## 2022-01-05 LAB — GLUCOSE, CAPILLARY
Glucose-Capillary: 117 mg/dL — ABNORMAL HIGH (ref 70–99)
Glucose-Capillary: 115 mg/dL — ABNORMAL HIGH (ref 70–99)
Glucose-Capillary: 99 mg/dL (ref 70–99)
Glucose-Capillary: 118 mg/dL — ABNORMAL HIGH (ref 70–99)
Glucose-Capillary: 111 mg/dL — ABNORMAL HIGH (ref 70–99)

## 2022-01-05 LAB — VITAMIN D 25 HYDROXY (VIT D DEFICIENCY, FRACTURES): Vit D, 25-Hydroxy: 32.61 ng/mL (ref 30–100)

## 2022-01-05 NOTE — H&P (View-Only) (Signed)
NAME:  Laura Bush, MRN:  [id number], DOB:  October 18, 1950, LOS: 2 ADMISSION DATE:  01/03/2022, CONSULTATION DATE:  01/04/2022 REFERRING MD: Dr. Sarajane Jews, CHIEF COMPLAINT: Pulmonary nodules  History of Present Illness:  71 year old woman, never smoker, with a history of left breast cancer treated by mastectomy and XRT.  Also with hypertension, diabetes, hyperlipidemia, GERD, TIA. She has been experiencing right lower extremity pain since early May.  She experienced a ground-level fall and a closed displaced transverse fracture of the shaft of the right femur.  Subsequent CT of the right femur and identified a displaced fracture with evidence for lytic bone lesion and soft tissue calcifications consistent with a pathologic fracture.  She is being considered for biopsy and surgical repair by Dr. Marcelino Scot.  She underwent CT scan of the head, chest abdomen and pelvis 01/03/2022 which I have reviewed, shows no intracranial abnormality, a 1.7 x 1.2 cm left lower lobe superior segmental rounded nodule with some spiculation and stranding.  There is a smaller 1.0 x 0.8 cm left lower lobe more inferior pulmonary nodule.  No mediastinal or hilar adenopathy.  Abdominal scan revealed a 4 mm hypodensity in the dome of the left lobe of the liver of unclear significance.    Pertinent  Medical History   Past Medical History:  Diagnosis Date   Asthma    in cold weather   Breast cancer ([place]) 09/18/14   left breast   Breast cancer ([place]) 10/07/14   bx left breast   Breast cancer of upper-outer quadrant of left female breast ([place]) 09/22/2014   Cluster headaches    in her 72's   GERD (gastroesophageal reflux disease)    Heart murmur    as a child (has outgrown)   High cholesterol    Hypertension    Lower extremity edema    Microscopic colitis    Morbid obesity ([place]) 09/30/2019   Obesity (BMI 30-39.9) 11/11/2020   OSA on CPAP    Personal history of radiation therapy    Pneumonia 2000's X 1   PONV  (postoperative nausea and vomiting)    Rosacea    S/P radiation therapy 12/30/14-01/27/15   left breast 50Gy total dose   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  Worked with the therapist today. Not having any right leg pain  Objective   Blood pressure (!) 127/54, pulse 83, temperature 99.8 F (37.7 C), temperature source Oral, resp. rate 14, height 4\' 10"  (1.473 m), weight 74.8 kg, SpO2 100 %.        Intake/Output Summary (Last 24 hours) at 01/05/2022 1034 Last data filed at 01/05/2022 0451 Gross per 24 hour  Intake 500 ml  Output 650 ml  Net -150 ml   Filed Weights   01/03/22 1809 01/04/22 0754  Weight: 74.8 kg 74.8 kg    Examination: General: Well-appearing, up to chair HENT: Oropharynx clear, laceration superior to her left eye Lungs: Clear bilaterally Cardiovascular: Regular, no murmur Abdomen: Nondistended, positive bowel sounds Extremities: 2 dressings on her right lower extremity clean and dry, 1+ lower extremity edema Neuro: Awake, appropriate, nonfocal GU: Not examined  Resolved Hospital Problem list     Assessment & Plan:  Two small left lower lobe rounded pulmonary nodules, concerning for either  primary lung cancer versus metastatic disease.  The absence of mediastinal adenopathy would suggest the former. -She has considered options for diagnostics and would like to go ahead and proceed with navigational bronchoscopy to try to get a tissue diagnosis upfront, correlate this with her right leg pathology findings which are still pending.  We will plan to get this done 6/1 at 7:30 AM -I will need to confirm that her existing CT chest is adequate to plan the navigation.  If not then I will obtain a more high-resolution super D CT before  tomorrow. -I will hold her morning enoxaparin 6/1, restart later in the day, time to be determined by risk for bleeding post procedure.   Labs   CBC: Recent Labs  Lab 01/03/22 1823 01/04/22 1328  WBC 9.1 10.0  NEUTROABS  --  9.1*  HGB 12.8 13.1  HCT 36.5 37.2  MCV 95.3 97.1  PLT 201 536    Basic Metabolic Panel: Recent Labs  Lab 01/03/22 1823 01/03/22 2222 01/04/22 1328  NA 138  --  137  K 3.4*  --  4.1  CL 105  --  103  CO2 21*  --  24  GLUCOSE 136*  --  116*  BUN 16  --  12  CREATININE 1.00  --  0.89  CALCIUM 9.4  --  9.2  MG  --  1.9  --   PHOS  --  3.4  --    GFR: Estimated Creatinine Clearance: 50.6 mL/min (by C-G formula based on SCr of 0.89 mg/dL). Recent Labs  Lab 01/03/22 1823 01/04/22 1328  WBC 9.1 10.0    Liver Function Tests: Recent Labs  Lab 01/03/22 2222 01/04/22 1328  AST 28 40  ALT 25 25  ALKPHOS 57 60  BILITOT 0.4 0.6  PROT 6.4* 6.3*  ALBUMIN 3.7 3.7   No results for input(s): LIPASE, AMYLASE in the last 168 hours. No results for input(s): AMMONIA in the last 168 hours.  ABG No results found for: PHART, PCO2ART, PO2ART, HCO3, TCO2, ACIDBASEDEF, O2SAT   Coagulation Profile: Recent Labs  Lab 01/03/22 2222  INR 1.5*    Cardiac Enzymes: Recent Labs  Lab 01/03/22 2222  CKTOTAL 509*    HbA1C: Hgb A1c MFr Bld  Date/Time Value Ref Range Status  01/03/2022 10:26 PM 5.3 4.8 - 5.6 % Final    Comment:    (NOTE) Pre diabetes:          5.7%-6.4%  Diabetes:              >6.4%  Glycemic control for   <7.0% adults with diabetes   04/06/2020 10:59 AM 5.2 <5.7 % of total Hgb Final    Comment:    For the purpose of screening for the presence of diabetes: . <5.7%       Consistent with the absence of diabetes 5.7-6.4%    Consistent with increased risk for diabetes             (prediabetes) > or =6.5%  Consistent with diabetes . This assay result is consistent with a decreased risk of diabetes. . Currently, no consensus  exists regarding use of hemoglobin A1c for diagnosis of diabetes in children. . According to American Diabetes Association (ADA) guidelines, hemoglobin A1c <7.0% represents optimal control in non-pregnant diabetic patients. Different metrics may apply to specific patient populations.  Standards of Medical Care in Diabetes(ADA). .     CBG: Recent Labs  Lab 01/04/22 1305 01/04/22 1626 01/04/22 2020  01/05/22 0438 01/05/22 0845  GLUCAP 107* 138* 162* 117* 115*     Critical care time: NA     Baltazar Apo, MD, PhD 01/05/2022, 10:34 AM [facility] [phone] or if no answer before 7:00PM call [phone] For any issues after 7:00PM please call eLink [phone]

## 2022-01-05 NOTE — TOC Progression Note (Addendum)
Transition of Care [facility]) - Progression Note    Patient Details  Name: Annastacia Watson [name] MRN: [id number] Date of Birth: 1951/06/11  Transition of Care [facility]) CM/SW Contact  Sharin Mons, RN Phone Number: 01/05/2022, 3:22 PM  Clinical Narrative:    NCM spoke with pt @ bedside regarding d/c planning. NCM shared PT recommendations for home health PT services. Pt agreeable. Pt without preference. Referral made with [facility] and accepted pending MD'S order. Order for HHPT and F2Fwill be needed from provider for home health services. Referral made with [facility] for RW, W/C. Equipment will be delivered to bedside prior to d/c.  TOC team will continue to monitor and assist with needs.   Expected Discharge Plan: [place] Barriers to Discharge: Continued Medical Work up  Expected Discharge Plan and Services Expected Discharge Plan: [place]   Discharge Planning Services: CM Consult   Living arrangements for the past 2 months: Single Family Home                 DME Arranged: Walker rolling, Wheelchair manual DME Agency: [facility] Date DME Agency Contacted: 01/05/22 Time DME Agency Contacted: [id number] Representative spoke with at DME Agency: [place]: PT [place]: [facility] Date [place]: 01/05/22 Time [place]: 1522 Representative spoke with at [place]: Anderson [place]( voicemessage left)   Social Determinants of Health (SDOH) Interventions    Readmission Risk Interventions     View : No data to display.

## 2022-01-05 NOTE — Progress Notes (Signed)
Orthopaedic Trauma Service Progress Note  Patient ID: Laura Bush MRN: [id number] DOB/AGE: 07-Nov-1950 71 y.o.  Subjective:  Doing well, pain controlled  Has already worked with therapies and has done fantastic.  She has had very minimal assistance with both PT and OT sessions  No other complaints Has not had any narcotics since surgery  Planned trip to the [place] in about 12 days  ROS As above  Objective:   VITALS:   Vitals:   01/04/22 1735 01/04/22 2033 01/05/22 0004 01/05/22 0451  BP: 134/76 (!) 122/56 (!) 113/52 (!) 107/51  Pulse: 74 70 68 80  Resp: 19 15 13 16   Temp: 99.1 F (37.3 C) 98.6 F (37 C)    TempSrc: Oral Oral    SpO2: 98% 99% 100% 99%  Weight:      Height:        Estimated body mass index is 34.49 kg/m as calculated from the following:   Height as of this encounter: 4\' 10"  (1.473 m).   Weight as of this encounter: 74.8 kg.   Intake/Output      05/30 0701 05/31 0700 05/31 0701 06/01 0700   I.V. (mL/kg) 1200 (16)    IV Piggyback 350    Total Intake(mL/kg) 1550 (20.7)    Urine (mL/kg/hr) 1310 (0.7)    Blood 50    Total Output 1360    Net +190           LABS  Results for orders placed or performed during the hospital encounter of 01/03/22 (from the past 24 hour(s))  Glucose, capillary     Status: Abnormal   Collection Time: 01/04/22  1:05 PM  Result Value Ref Range   Glucose-Capillary 107 (H) 70 - 99 mg/dL  Prealbumin     Status: None   Collection Time: 01/04/22  1:28 PM  Result Value Ref Range   Prealbumin 26.9 18 - 38 mg/dL  HIV Antibody (routine testing w rflx)     Status: None   Collection Time: 01/04/22  1:28 PM  Result Value Ref Range   HIV Screen 4th Generation wRfx Non Reactive Non Reactive  CBC with Differential     Status: Abnormal   Collection Time: 01/04/22  1:28 PM  Result Value Ref Range   WBC 10.0 4.0 - 10.5 K/uL   RBC 3.83 (L) 3.87 -  5.11 MIL/uL   Hemoglobin 13.1 12.0 - 15.0 g/dL   HCT 37.2 36.0 - 46.0 %   MCV 97.1 80.0 - 100.0 fL   MCH 34.2 (H) 26.0 - 34.0 pg   MCHC 35.2 30.0 - 36.0 g/dL   RDW 12.1 11.5 - 15.5 %   Platelets 162 150 - 400 K/uL   nRBC 0.0 0.0 - 0.2 %   Neutrophils Relative % 91 %   Neutro Abs 9.1 (H) 1.7 - 7.7 K/uL   Lymphocytes Relative 4 %   Lymphs Abs 0.4 (L) 0.7 - 4.0 K/uL   Monocytes Relative 4 %   Monocytes Absolute 0.4 0.1 - 1.0 K/uL   Eosinophils Relative 0 %   Eosinophils Absolute 0.0 0.0 - 0.5 K/uL   Basophils Relative 0 %   Basophils Absolute 0.0 0.0 - 0.1 K/uL   Immature Granulocytes 1 %   Abs Immature Granulocytes 0.07 0.00 - 0.07 K/uL  Comprehensive metabolic  panel     Status: Abnormal   Collection Time: 01/04/22  1:28 PM  Result Value Ref Range   Sodium 137 135 - 145 mmol/L   Potassium 4.1 3.5 - 5.1 mmol/L   Chloride 103 98 - 111 mmol/L   CO2 24 22 - 32 mmol/L   Glucose, Bld 116 (H) 70 - 99 mg/dL   BUN 12 8 - 23 mg/dL   Creatinine, Ser 0.89 0.44 - 1.00 mg/dL   Calcium 9.2 8.9 - 10.3 mg/dL   Total Protein 6.3 (L) 6.5 - 8.1 g/dL   Albumin 3.7 3.5 - 5.0 g/dL   AST 40 15 - 41 U/L   ALT 25 0 - 44 U/L   Alkaline Phosphatase 60 38 - 126 U/L   Total Bilirubin 0.6 0.3 - 1.2 mg/dL   GFR, Estimated >60 >60 mL/min   Anion gap 10 5 - 15  Glucose, capillary     Status: Abnormal   Collection Time: 01/04/22  4:26 PM  Result Value Ref Range   Glucose-Capillary 138 (H) 70 - 99 mg/dL  Glucose, capillary     Status: Abnormal   Collection Time: 01/04/22  8:20 PM  Result Value Ref Range   Glucose-Capillary 162 (H) 70 - 99 mg/dL  Glucose, capillary     Status: Abnormal   Collection Time: 01/05/22  4:38 AM  Result Value Ref Range   Glucose-Capillary 117 (H) 70 - 99 mg/dL  Glucose, capillary     Status: Abnormal   Collection Time: 01/05/22  8:45 AM  Result Value Ref Range   Glucose-Capillary 115 (H) 70 - 99 mg/dL     PHYSICAL EXAM:   Gen: sitting up in chair, NAD, appears well.   Husband at bedside Lungs: Unlabored Cardiac: reg Ext:       Right lower extremity  Dressings are clean, dry and intact  Extremity is warm  Mild tenderness over right thigh  Excellent knee and ankle range of motion  DPN, SPN, TN sensory function intact  EHL, FHL, lesser toe motor functions intact  Ankle flexion, extension, inversion and eversion intact  No DCT  Compartments are soft and nontender, no pain out of proportion with passive stretching   Assessment/Plan: 1 Day Post-Op   Principal Problem:   Pathologic fracture of femur ([facility]) Active Problems:   Essential hypertension   Hypercholesteremia   GERD (gastroesophageal reflux disease)   Malignant 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, estrogen receptor positive ([place])   DM (diabetes mellitus), type 2 (HCC)   OSA on CPAP   Laceration of left eyebrow   Mass of lower lobe of left lung   Aortic atherosclerosis (HCC)   CAD (coronary artery disease)   Anti-infectives (From admission, onward)    Start     Dose/Rate Route Frequency Ordered Stop   01/04/22 1800  ceFAZolin (ANCEF) IVPB 2g/100 mL premix        2 g 200 mL/hr over 30 Minutes Intravenous Every 6 hours 01/04/22 1215 01/05/22 0046   01/04/22 1230  ceFAZolin (ANCEF) IVPB 2g/100 mL premix  Status:  Discontinued        2 g 200 mL/hr over 30 Minutes Intravenous On call to O.R. 01/04/22 1215 01/04/22 1256   01/04/22 0744  ceFAZolin (ANCEF) 2-4 GM/100ML-% IVPB       Note to Pharmacy: Ladoris Gene A: cabinet override      01/04/22 0744 01/04/22 1959     .  POD/HD#: 58  71 year old female history  of breast cancer with low energy right femoral shaft fracture, suspicious for pathologic fracture  -Ground-level fall with antecedent right thigh pain  -Likely pathologic fracture right femur s/p retrograde intramedullary nailing  Weight-bear as tolerated right leg  Range of motion as tolerated right hip and knee  PT and OT evaluations  Dressing changes  as needed starting on 01/06/2022  Ice and elevate as needed for swelling and pain control   No other restrictions noted  Okay to travel to the [employer] from our standpoint   TED hose right leg   Follow-up on intraoperative pathology  - Pain management:  Multimodal  Patient has had excellent pain control.  No narcotics since surgery  - ABL anemia/Hemodynamics  CBC in am  - Medical issues   Per primary team  Bronchoscopy tomorrow with pulmonology for biopsy  - DVT/PE prophylaxis:  Lovenox for now  Recommend Eliquis 2.5 mg twice daily for 30 days at discharge - ID:   Perioperative antibiotics - Metabolic Bone Disease:  Vitamin D levels pending  Patient had normal DEXA scan feb 2022.  Thus this favor more of pathologic fracture due to tumor   - Activity:  As above  - Impediments to fracture healing:  Fracture through tumor   - Dispo:  Ortho issues stable  Continue with therapies  Follow-up with orthopedics in 2 to 3 weeks   Okay for her to follow-up when she returns from the [employer], PA-C [phone] (C) 01/05/2022, 9:03 AM  [facility] [place] [address] [phone] Jenetta [phone] (F)    After 5pm and on the weekends please log on to Amion, go to orthopaedics and the look under the [facility] Call for the provider(s) on call. You can also call our office at [phone] and then follow the prompts to be connected to the call team.   Patient ID: Laura Bush, female   DOB: Dec 18, 1950, 71 y.o.   MRN: [id number]

## 2022-01-05 NOTE — Progress Notes (Signed)
NAME:  Laura Bush, MRN:  [id number], DOB:  04/16/1951, LOS: 2 ADMISSION DATE:  01/03/2022, CONSULTATION DATE:  01/04/2022 REFERRING MD: Dr. Sarajane Jews, CHIEF COMPLAINT: Pulmonary nodules  History of Present Illness:  71 year old woman, never smoker, with a history of left breast cancer treated by mastectomy and XRT.  Also with hypertension, diabetes, hyperlipidemia, GERD, TIA. She has been experiencing right lower extremity pain since early May.  She experienced a ground-level fall and a closed displaced transverse fracture of the shaft of the right femur.  Subsequent CT of the right femur and identified a displaced fracture with evidence for lytic bone lesion and soft tissue calcifications consistent with a pathologic fracture.  She is being considered for biopsy and surgical repair by Dr. Marcelino Scot.  She underwent CT scan of the head, chest abdomen and pelvis 01/03/2022 which I have reviewed, shows no intracranial abnormality, a 1.7 x 1.2 cm left lower lobe superior segmental rounded nodule with some spiculation and stranding.  There is a smaller 1.0 x 0.8 cm left lower lobe more inferior pulmonary nodule.  No mediastinal or hilar adenopathy.  Abdominal scan revealed a 4 mm hypodensity in the dome of the left lobe of the liver of unclear significance.    Pertinent  Medical History   Past Medical History:  Diagnosis Date   Asthma    in cold weather   Breast cancer ([facility]) 09/18/14   left breast   Breast cancer ([facility]) 10/07/14   bx left breast   Breast cancer of upper-outer quadrant of left female breast ([facility]) 09/22/2014   Cluster headaches    in her 66's   GERD (gastroesophageal reflux disease)    Heart murmur    as a child (has outgrown)   High cholesterol    Hypertension    Lower extremity edema    Microscopic colitis    Morbid obesity ([facility]) 09/30/2019   Obesity (BMI 30-39.9) 11/11/2020   OSA on CPAP    Personal history of radiation therapy    Pneumonia 2000's X 1   PONV  (postoperative nausea and vomiting)    Rosacea    S/P radiation therapy 12/30/14-01/27/15   left breast 50Gy total dose   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  Worked with the therapist today. Not having any right leg pain  Objective   Blood pressure (!) 127/54, pulse 83, temperature 99.8 F (37.7 C), temperature source Oral, resp. rate 14, height 4\' 10"  (1.473 m), weight 74.8 kg, SpO2 100 %.        Intake/Output Summary (Last 24 hours) at 01/05/2022 1034 Last data filed at 01/05/2022 0451 Gross per 24 hour  Intake 500 ml  Output 650 ml  Net -150 ml   Filed Weights   01/03/22 1809 01/04/22 0754  Weight: 74.8 kg 74.8 kg    Examination: General: Well-appearing, up to chair HENT: Oropharynx clear, laceration superior to her left eye Lungs: Clear bilaterally Cardiovascular: Regular, no murmur Abdomen: Nondistended, positive bowel sounds Extremities: 2 dressings on her right lower extremity clean and dry, 1+ lower extremity edema Neuro: Awake, appropriate, nonfocal GU: Not examined  Resolved Hospital Problem list     Assessment & Plan:  Two small left lower lobe rounded pulmonary nodules, concerning for either  primary lung cancer versus metastatic disease.  The absence of mediastinal adenopathy would suggest the former. -She has considered options for diagnostics and would like to go ahead and proceed with navigational bronchoscopy to try to get a tissue diagnosis upfront, correlate this with her right leg pathology findings which are still pending.  We will plan to get this done 6/1 at 7:30 AM -I will need to confirm that her existing CT chest is adequate to plan the navigation.  If not then I will obtain a more high-resolution super D CT before  tomorrow. -I will hold her morning enoxaparin 6/1, restart later in the day, time to be determined by risk for bleeding post procedure.   Labs   CBC: Recent Labs  Lab 01/03/22 1823 01/04/22 1328  WBC 9.1 10.0  NEUTROABS  --  9.1*  HGB 12.8 13.1  HCT 36.5 37.2  MCV 95.3 97.1  PLT 201 956    Basic Metabolic Panel: Recent Labs  Lab 01/03/22 1823 01/03/22 2222 01/04/22 1328  NA 138  --  137  K 3.4*  --  4.1  CL 105  --  103  CO2 21*  --  24  GLUCOSE 136*  --  116*  BUN 16  --  12  CREATININE 1.00  --  0.89  CALCIUM 9.4  --  9.2  MG  --  1.9  --   PHOS  --  3.4  --    GFR: Estimated Creatinine Clearance: 50.6 mL/min (by C-G formula based on SCr of 0.89 mg/dL). Recent Labs  Lab 01/03/22 1823 01/04/22 1328  WBC 9.1 10.0    Liver Function Tests: Recent Labs  Lab 01/03/22 2222 01/04/22 1328  AST 28 40  ALT 25 25  ALKPHOS 57 60  BILITOT 0.4 0.6  PROT 6.4* 6.3*  ALBUMIN 3.7 3.7   No results for input(s): LIPASE, AMYLASE in the last 168 hours. No results for input(s): AMMONIA in the last 168 hours.  ABG No results found for: PHART, PCO2ART, PO2ART, HCO3, TCO2, ACIDBASEDEF, O2SAT   Coagulation Profile: Recent Labs  Lab 01/03/22 2222  INR 1.5*    Cardiac Enzymes: Recent Labs  Lab 01/03/22 2222  CKTOTAL 509*    HbA1C: Hgb A1c MFr Bld  Date/Time Value Ref Range Status  01/03/2022 10:26 PM 5.3 4.8 - 5.6 % Final    Comment:    (NOTE) Pre diabetes:          5.7%-6.4%  Diabetes:              >6.4%  Glycemic control for   <7.0% adults with diabetes   04/06/2020 10:59 AM 5.2 <5.7 % of total Hgb Final    Comment:    For the purpose of screening for the presence of diabetes: . <5.7%       Consistent with the absence of diabetes 5.7-6.4%    Consistent with increased risk for diabetes             (prediabetes) > or =6.5%  Consistent with diabetes . This assay result is consistent with a decreased risk of diabetes. . Currently, no consensus  exists regarding use of hemoglobin A1c for diagnosis of diabetes in children. . According to American Diabetes Association (ADA) guidelines, hemoglobin A1c <7.0% represents optimal control in non-pregnant diabetic patients. Different metrics may apply to specific patient populations.  Standards of Medical Care in Diabetes(ADA). .     CBG: Recent Labs  Lab 01/04/22 1305 01/04/22 1626 01/04/22 2020  01/05/22 0438 01/05/22 0845  GLUCAP 107* 138* 162* 117* 115*     Critical care time: NA     Baltazar Apo, MD, PhD 01/05/2022, 10:34 AM [facility] [phone] or if no answer before 7:00PM call [phone] For any issues after 7:00PM please call eLink [phone]

## 2022-01-05 NOTE — Progress Notes (Signed)
OT Cancellation Note  Patient Details Name: Laura Bush MRN: [id number] DOB: 01/19/1951   Cancelled Treatment:    Reason Eval/Treat Not Completed: Patient declined, no reason specified Pt requesting time to talk to friend on phone. Will follow up for OT eval as schedule permits.  [name] 01/05/2022, 8:39 AM

## 2022-01-05 NOTE — Progress Notes (Signed)
Patient is complaining that her fingers are very raw and she does not want her glucose checked every 4 hours.  Will contact on call MD.  Time 2034 On call MD will keep order in place b/c patient will be NPO after midnight.  Patient and nurse agreed to have glucose checked at 2000 tonight, skip midnight, and check again at 0400 in the morning.

## 2022-01-05 NOTE — TOC CAGE-AID Note (Signed)
Transition of Care [facility]) - CAGE-AID Screening   Patient Details  Name: Laura Bush MRN: [id number] Date of Birth: [birth year]  Transition of Care [facility]) CM/SW Contact:    Gaetano Hawthorne Tarpley-Carter, [place] Phone Number: 01/05/2022, 2:37 PM   Clinical Narrative: Pt participated in [place].  Pt stated she does not use substance or ETOH (socially).  Pt was not offered resources, due to no usage of substance or ETOH.    Valorie Mcgrory Tarpley-Carter, MSW, LCSW-A Pronouns:  She/Her/Hers [facility] HealthTransitions of Care Clinical Social Worker Direct Number:  [phone] [email or web address]  CAGE-AID Screening:    Have You Ever Felt You Ought to Cut Down on Your Drinking or Drug Use?: No Have People Annoyed You By [employer] Your Drinking Or Drug Use?: No Have You Felt Bad Or Guilty About Your Drinking Or Drug Use?: No Have You Ever Had a Drink or Used Drugs First Thing In The Morning to Steady Your Nerves or to Get Rid of a Hangover?: No CAGE-AID Score: 0  Substance Abuse Education Offered: No

## 2022-01-05 NOTE — Progress Notes (Signed)
[facility]                                                                               Laura Bush, is a 71 y.o. female, DOB - [birth year], TDV:[id number] Admit date - 01/03/2022    Outpatient Primary MD for the patient is Copland, Gay Filler, MD  LOS - 2  days    Brief summary   48yow PMH left breast cancer status post lumpectomy, XRT 2016, right breast ductal carcinoma in situ; released from oncology. Care 2021, was admitted 5/29 when she slipped in the bathroom and fell resulting in leg pain. Imaging revealed pathologic right femur fx. S/p surgery 5/30 w/ bone biopsy pending. Tentative plan for biopsy LLL via bronchoscopy later this weak. Wants to follow-up with [facility].   Assessment & Plan    Assessment and Plan: * Pathologic fracture of femur (Laura Bush) --s/p low energy impact in patient w/ history of breast cancer; X-rays revealed lytic lesion lateral femoral cortex. CT C/A/P showed lung mass. CT R femur showed pathologic fracture. --s/p surgery 5/30, bone biopsy pending --management per orthopedics - pain is adequately controlled.  - therapy evaluations on board recommending supervision and assistance.   Mass of lower lobe of left lung --Irregular mass, with nodule inferior to it. Gross appearance is more suggestive of a bronchogenic neoplasm with intralobe synchronous primary or metastasis, but both could be metastatic from an extrathoracic primary in this patient with history of left breast cancer.  --PET-CT recommended --per Dr. Irene Limbo, can wait on biopsy of bone; then d/w oncology; if sufficient may not need lung biopsy. Discussed with Dr. Lamonte Sakai, this is amenable to bronchoscopic biopsy, . _ scheduled for navigational biopsy tomorrow morning.  --will need outpatient follow-up with [facility] oncology.   DM (diabetes mellitus), type 2 ([place]) adequately controlled CBG'S.  NON insulin dependent.  CBG (last 3)  Recent Labs    01/05/22 0438 01/05/22 0845  01/05/22 1121  GLUCAP 117* 115* 99   Resume SSI.   Laceration of left eyebrow --s/p Prolene sutures x6 in ED 5/29 --remove sutures in 5-7 days 6/3-6/5  GERD (gastroesophageal reflux disease) Stable, continue with Protonix.   Hypercholesteremia On statin, continue the same.   Essential hypertension BP have been adequately controlled, off BP meds.  Continue to monitor.   CAD (coronary artery disease) --seen on chest CT; follow-up as an outpatient          Estimated body mass index is 34.49 kg/m as calculated from the following:   Height as of this encounter: 4\' 10"  (1.473 m).   Weight as of this encounter: 74.8 kg.  Code Status: full code.  DVT Prophylaxis:  SCDs Start: 01/04/22 1216 SCDs Start: 01/03/22 2205   Level of Care: Level of care: Med-Surg Family Communication: Updated patient's family at bedside.   Disposition Plan:     Remains inpatient appropriate:  bronchoscopy in am   Procedures:  Open biopsy the pathological fracture.  IM nail placement on right femur  navigational bronchoscopy scheduled for tomorrow.   Consultants:   Orthopedics.  PCCM.   Antimicrobials:   Anti-infectives (From admission, onward)    Start  Dose/Rate Route Frequency Ordered Stop   01/04/22 1800  ceFAZolin (ANCEF) IVPB 2g/100 mL premix        2 g 200 mL/hr over 30 Minutes Intravenous Every 6 hours 01/04/22 1215 01/05/22 0046   01/04/22 1230  ceFAZolin (ANCEF) IVPB 2g/100 mL premix  Status:  Discontinued        2 g 200 mL/hr over 30 Minutes Intravenous On call to O.R. 01/04/22 1215 01/04/22 1256   01/04/22 0744  ceFAZolin (ANCEF) 2-4 GM/100ML-% IVPB       Note to Pharmacy: Ladoris Gene A: cabinet override      01/04/22 0744 01/04/22 1959        Medications  Scheduled Meds:  docusate sodium  100 mg Oral BID   insulin aspart  0-9 Units Subcutaneous Q4H   mupirocin ointment  1 application. Nasal BID   pantoprazole  40 mg Oral Daily   Continuous Infusions:   lactated ringers 10 mL/hr at 01/04/22 0907   methocarbamol (ROBAXIN) IV     PRN Meds:.HYDROcodone-acetaminophen, HYDROmorphone (DILAUDID) injection, menthol-cetylpyridinium **OR** phenol, methocarbamol **OR** methocarbamol (ROBAXIN) IV, morphine injection, ondansetron **OR** ondansetron (ZOFRAN) IV    Subjective:   Female Bush was seen and examined today. No pain. ,wanted to know when they can talk to Dr Irene Limbo.  Objective:   Vitals:   01/04/22 2033 01/05/22 0004 01/05/22 0451 01/05/22 0844  BP: (!) 122/56 (!) 113/52 (!) 107/51 (!) 127/54  Pulse: 70 68 80 83  Resp: 15 13 16 14   Temp: 98.6 F (37 C)   99.8 F (37.7 C)  TempSrc: Oral   Oral  SpO2: 99% 100% 99% 100%  Weight:      Height:        Intake/Output Summary (Last 24 hours) at 01/05/2022 1153 Last data filed at 01/05/2022 0451 Gross per 24 hour  Intake 300 ml  Output 650 ml  Net -350 ml   Filed Weights   01/03/22 1809 01/04/22 0754  Weight: 74.8 kg 74.8 kg     Exam General: Alert and oriented x 3, NAD Cardiovascular: S1 S2 auscultated, no murmurs, RRR Respiratory: Clear to auscultation bilaterally, no wheezing, rales or rhonchi Gastrointestinal: Soft, nontender, nondistended, + bowel sounds Ext: no pedal edema bilaterally Neuro: AAOx3, Cr N's II- XII. Strength 5/5 upper and lower extremities bilaterally Skin: No rashes Psych: Normal affect and demeanor, alert and oriented x3    Data Reviewed:  I have personally reviewed following labs and imaging studies   CBC Lab Results  Component Value Date   WBC 10.0 01/04/2022   RBC 3.83 (L) 01/04/2022   HGB 13.1 01/04/2022   HCT 37.2 01/04/2022   MCV 97.1 01/04/2022   MCH 34.2 (H) 01/04/2022   PLT 162 01/04/2022   MCHC 35.2 01/04/2022   RDW 12.1 01/04/2022   LYMPHSABS 0.4 (L) 01/04/2022   MONOABS 0.4 01/04/2022   EOSABS 0.0 01/04/2022   BASOSABS 0.0 31/49/7026     Last metabolic panel Lab Results  Component Value Date   NA 137 01/04/2022   K 4.1  01/04/2022   CL 103 01/04/2022   CO2 24 01/04/2022   BUN 12 01/04/2022   CREATININE 0.89 01/04/2022   GLUCOSE 116 (H) 01/04/2022   GFRNONAA >60 01/04/2022   GFRAA >60 10/03/2019   CALCIUM 9.2 01/04/2022   PHOS 3.4 01/03/2022   PROT 6.3 (L) 01/04/2022   ALBUMIN 3.7 01/04/2022   BILITOT 0.6 01/04/2022   ALKPHOS 60 01/04/2022   AST 40 01/04/2022   ALT  25 01/04/2022   ANIONGAP 10 01/04/2022    CBG (last 3)  Recent Labs    01/05/22 0438 01/05/22 0845 01/05/22 1121  GLUCAP 117* 115* 99      Coagulation Profile: Recent Labs  Lab 01/03/22 2222  INR 1.5*     Radiology Studies: CT Head Wo Contrast  Result Date: 01/03/2022 CLINICAL DATA:  Lower extremity injury. Fell. Also musculoskeletal neoplasm for staging. There is a 2016 history of partial mastectomy for carcinoma left breast EXAM: CT HEAD WITHOUT CONTRAST CT CHEST, ABDOMEN AND PELVIS WITH CONTRAST TECHNIQUE: Contiguous axial images were obtained from the base of the skull through the vertex without intravenous contrast. Multidetector CT imaging of the chest, abdomen and pelvis was performed during intravenous contrast administration. RADIATION DOSE REDUCTION: This exam was performed according to the departmental dose-optimization program which includes automated exposure control, adjustment of the mA and/or kV according to patient size and/or use of iterative reconstruction technique. CONTRAST:  138mL OMNIPAQUE IOHEXOL 300 MG/ML  SOLN COMPARISON:  No prior body CT for comparison. Only relevant previous study is portable chest x-ray earlier today. FINDINGS: CT HEAD FINDINGS Brain: No evidence of acute infarction, hemorrhage, hydrocephalus, extra-axial collection or mass lesion/mass effect. There is mild cerebrocerebellar atrophy. Vascular: There are scattered calcifications of the carotid siphons but no hyperdense central vasculature. Skull: No fracture or focal bone lesion is seen. Sinuses/Orbits: No acute findings. No mastoid or  middle ear effusion. There is membrane thickening in the posterior ethmoid air cells, right sphenoid air cell without fluid levels. Other: None. CT CHEST FINDINGS Cardiovascular: Cardiac size is normal. There are calcifications in the proximal LAD coronary artery. There is no pericardial effusion. There is mild aortic atherosclerosis without aneurysm or dissection. There are mild calcifications and thickening in the aortic valve but no aortic root dilatation. Pulmonary arteries are normal caliber and centrally clear, pulmonary veins are decompressed. Mediastinum/Nodes: The lower poles of the thyroid gland are unremarkable. Axillary spaces are clear. No intrathoracic adenopathy is seen. There is a small hiatal hernia. Thoracic esophagus unremarkable aside from small amounts of scattered retained fluid. There is no tracheal filling defect. Lungs/Pleura: There is a lobular irregular 1.7 x 1.2 cm mass in the left lower lobe posteriorly in the superior segment. There is pleural stranding posterior to this with small surrounding spiculations. There is a second, ovoid to rounded nodule inferior to this in the posterior basal segment of the left lower lobe measuring 1.0 x 0.8 cm. Findings are worrisome for primary bronchogenic neoplasm with a small intraorgan synchronous primary or intraorgan metastatic lesion versus 2 separate metastases. There is subpleural reticulation in the anterior aspect of the left upper lobe which is probably related to left breast XRT. The remainder of both lungs are clear. There is no pleural effusion, thickening or pneumothorax. Musculoskeletal: No chest wall mass or suspicious bone lesions identified. There is thoracic spondylosis. There are postsurgical changes in the upper half of the left breast, small foci of fat necrosis and oil cysts anteriorly. CT ABDOMEN AND PELVIS FINDINGS Hepatobiliary: There is a 4 mm too small to characterize hypodensity in the dome of the left lobe of the liver.  There is a 1.3 cm cyst of 11.9 Hounsfield units in the left lobe medial segment. The remaining liver is unremarkable. There are 2 small stones in the proximal gallbladder but no wall thickening or biliary dilatation. Pancreas: No mass enhancement or ductal dilatation is seen. Spleen: No mass enhancement or splenomegaly. Adrenals/Urinary Tract: No adrenal mass  or renal cortical mass enhancement. There is no urinary stone or obstruction. There is no bladder thickening. Stomach/Bowel: There is mild gastric fluid distention without wall thickening. The unopacified small bowel is unremarkable. An appendix is not seen in this patient. There is moderate stool retention ascending and proximal transverse colon. Left colonic diverticula without evidence of colitis or diverticulitis. Vascular/Lymphatic: There are mild calcific plaques in the infrarenal aorta, common iliac arteries. No aneurysm or stenosis is seen. No adenopathy seen in the abdomen or the pelvis. Reproductive: No mass or other significant abnormality. Status post hysterectomy. Musculoskeletal: There is prominent facet hypertrophy in the lower lumbar spine most significant at L4-5 and L5-S1, with spurring and osteophytes of the pubic symphysis. No destructive bone lesion is seen. Other: There is no incarcerated hernia. There is no free air, hemorrhage or fluid. IMPRESSION: 1. Irregular 1.7 x 1.2 cm left lower lobe mass, with 1.0 x 0.8 cm nodule inferior to it. The gross appearance is more suggestive of a bronchogenic neoplasm with intralobe synchronous primary or metastasis, but both could be metastatic from an extrathoracic primary in this patient with history of left breast cancer. PET-CT is recommended and tissue sampling will almost certainly be needed. 2. Coronary artery and aortic atherosclerosis. 3. Small hiatal hernia, scattered retained fluid in the otherwise unremarkable esophagus. 4. Subpleural reticulation anteriorly in the left upper lobe most  likely due to left breast XRT with evidence of partial left mastectomy. 5. 4 mm too small to characterize hypodensity in the hepatic dome and a small cyst in the left lobe medial segment. 6. No adenopathy or mass enhancement in the abdomen or pelvis. 7. Constipation and diverticulosis. 8. Degenerative changes of the spine without destructive bone lesions. 9. No acute intracranial CT findings or appreciable mass. Mild global atrophy. Electronically Signed   By: Telford Nab M.D.   On: 01/03/2022 23:42   CT FEMUR RIGHT W CONTRAST  Result Date: 01/03/2022 CLINICAL DATA:  Metastatic disease, evaluate for pathologic fracture. Fall. History of breast cancer. EXAM: CT OF THE LOWER RIGHT EXTREMITY WITH CONTRAST TECHNIQUE: Multidetector CT imaging of the lower right extremity was performed according to the standard protocol following intravenous contrast administration. RADIATION DOSE REDUCTION: This exam was performed according to the departmental dose-optimization program which includes automated exposure control, adjustment of the mA and/or kV according to patient size and/or use of iterative reconstruction technique. CONTRAST:  119mL OMNIPAQUE IOHEXOL 300 MG/ML  SOLN COMPARISON:  Right femur x-ray 01/03/2022 FINDINGS: Bones/Joint/Cartilage There is an acute oblique fracture through the mid femoral diaphysis. There is 1 shaft with posterior displacement of the distal fracture fragment with 3 cm of overlap. At the level of the fracture the lateral cortex of the femur demonstrates heterogeneous lytic areas with surrounding soft tissue calcifications. Ligaments Suboptimally assessed by CT. Muscles and Tendons Grossly within normal limits. Soft tissues No focal hematoma or fluid collection. IMPRESSION: 1. Acute displaced fracture of the mid femur with lytic bone lesion/soft tissue calcifications at this level. Findings are compatible with pathologic fracture. Electronically Signed   By: Ronney Asters M.D.   On:  01/03/2022 23:33   CT CHEST ABDOMEN PELVIS W CONTRAST  Result Date: 01/03/2022 CLINICAL DATA:  Lower extremity injury. Fell. Also musculoskeletal neoplasm for staging. There is a 2016 history of partial mastectomy for carcinoma left breast EXAM: CT HEAD WITHOUT CONTRAST CT CHEST, ABDOMEN AND PELVIS WITH CONTRAST TECHNIQUE: Contiguous axial images were obtained from the base of the skull through the vertex without intravenous  contrast. Multidetector CT imaging of the chest, abdomen and pelvis was performed during intravenous contrast administration. RADIATION DOSE REDUCTION: This exam was performed according to the departmental dose-optimization program which includes automated exposure control, adjustment of the mA and/or kV according to patient size and/or use of iterative reconstruction technique. CONTRAST:  126mL OMNIPAQUE IOHEXOL 300 MG/ML  SOLN COMPARISON:  No prior body CT for comparison. Only relevant previous study is portable chest x-ray earlier today. FINDINGS: CT HEAD FINDINGS Brain: No evidence of acute infarction, hemorrhage, hydrocephalus, extra-axial collection or mass lesion/mass effect. There is mild cerebrocerebellar atrophy. Vascular: There are scattered calcifications of the carotid siphons but no hyperdense central vasculature. Skull: No fracture or focal bone lesion is seen. Sinuses/Orbits: No acute findings. No mastoid or middle ear effusion. There is membrane thickening in the posterior ethmoid air cells, right sphenoid air cell without fluid levels. Other: None. CT CHEST FINDINGS Cardiovascular: Cardiac size is normal. There are calcifications in the proximal LAD coronary artery. There is no pericardial effusion. There is mild aortic atherosclerosis without aneurysm or dissection. There are mild calcifications and thickening in the aortic valve but no aortic root dilatation. Pulmonary arteries are normal caliber and centrally clear, pulmonary veins are decompressed. Mediastinum/Nodes:  The lower poles of the thyroid gland are unremarkable. Axillary spaces are clear. No intrathoracic adenopathy is seen. There is a small hiatal hernia. Thoracic esophagus unremarkable aside from small amounts of scattered retained fluid. There is no tracheal filling defect. Lungs/Pleura: There is a lobular irregular 1.7 x 1.2 cm mass in the left lower lobe posteriorly in the superior segment. There is pleural stranding posterior to this with small surrounding spiculations. There is a second, ovoid to rounded nodule inferior to this in the posterior basal segment of the left lower lobe measuring 1.0 x 0.8 cm. Findings are worrisome for primary bronchogenic neoplasm with a small intraorgan synchronous primary or intraorgan metastatic lesion versus 2 separate metastases. There is subpleural reticulation in the anterior aspect of the left upper lobe which is probably related to left breast XRT. The remainder of both lungs are clear. There is no pleural effusion, thickening or pneumothorax. Musculoskeletal: No chest wall mass or suspicious bone lesions identified. There is thoracic spondylosis. There are postsurgical changes in the upper half of the left breast, small foci of fat necrosis and oil cysts anteriorly. CT ABDOMEN AND PELVIS FINDINGS Hepatobiliary: There is a 4 mm too small to characterize hypodensity in the dome of the left lobe of the liver. There is a 1.3 cm cyst of 11.9 Hounsfield units in the left lobe medial segment. The remaining liver is unremarkable. There are 2 small stones in the proximal gallbladder but no wall thickening or biliary dilatation. Pancreas: No mass enhancement or ductal dilatation is seen. Spleen: No mass enhancement or splenomegaly. Adrenals/Urinary Tract: No adrenal mass or renal cortical mass enhancement. There is no urinary stone or obstruction. There is no bladder thickening. Stomach/Bowel: There is mild gastric fluid distention without wall thickening. The unopacified small bowel  is unremarkable. An appendix is not seen in this patient. There is moderate stool retention ascending and proximal transverse colon. Left colonic diverticula without evidence of colitis or diverticulitis. Vascular/Lymphatic: There are mild calcific plaques in the infrarenal aorta, common iliac arteries. No aneurysm or stenosis is seen. No adenopathy seen in the abdomen or the pelvis. Reproductive: No mass or other significant abnormality. Status post hysterectomy. Musculoskeletal: There is prominent facet hypertrophy in the lower lumbar spine most significant at L4-5  and L5-S1, with spurring and osteophytes of the pubic symphysis. No destructive bone lesion is seen. Other: There is no incarcerated hernia. There is no free air, hemorrhage or fluid. IMPRESSION: 1. Irregular 1.7 x 1.2 cm left lower lobe mass, with 1.0 x 0.8 cm nodule inferior to it. The gross appearance is more suggestive of a bronchogenic neoplasm with intralobe synchronous primary or metastasis, but both could be metastatic from an extrathoracic primary in this patient with history of left breast cancer. PET-CT is recommended and tissue sampling will almost certainly be needed. 2. Coronary artery and aortic atherosclerosis. 3. Small hiatal hernia, scattered retained fluid in the otherwise unremarkable esophagus. 4. Subpleural reticulation anteriorly in the left upper lobe most likely due to left breast XRT with evidence of partial left mastectomy. 5. 4 mm too small to characterize hypodensity in the hepatic dome and a small cyst in the left lobe medial segment. 6. No adenopathy or mass enhancement in the abdomen or pelvis. 7. Constipation and diverticulosis. 8. Degenerative changes of the spine without destructive bone lesions. 9. No acute intracranial CT findings or appreciable mass. Mild global atrophy. Electronically Signed   By: Telford Nab M.D.   On: 01/03/2022 23:42   DG Chest Port 1 View  Result Date: 01/03/2022 CLINICAL DATA:  [place],  right-sided pain EXAM: PORTABLE CHEST 1 VIEW COMPARISON:  None Available. FINDINGS: Single frontal view of the chest demonstrates an unremarkable cardiac silhouette. No acute airspace disease, effusion, or pneumothorax. No acute or destructive bony lesions. IMPRESSION: 1. No acute intrathoracic process. Electronically Signed   By: Randa Ngo M.D.   On: 01/03/2022 18:23   DG C-Arm 1-60 Min-No Report  Result Date: 01/04/2022 Fluoroscopy was utilized by the requesting physician.  No radiographic interpretation.   DG C-Arm 1-60 Min-No Report  Result Date: 01/04/2022 Fluoroscopy was utilized by the requesting physician.  No radiographic interpretation.   DG FEMUR, MIN 2 VIEWS RIGHT  Result Date: 01/04/2022 CLINICAL DATA:  Right femoral ORIF EXAM: RIGHT FEMUR 2 VIEWS COMPARISON:  X-ray 01/03/2022 FINDINGS: Seven C-arm fluoroscopic images were obtained intraoperatively and submitted for post operative interpretation. Images demonstrate long intramedullary rod with proximal and distal interlocking screw fixation traversing mid right femoral diaphyseal fracture. 1 minute 26 seconds fluoroscopy time utilized. Radiation dose: 12.8 mGy. Please see the performing provider's procedural report for further detail. IMPRESSION: Intraoperative right femur ORIF. Electronically Signed   By: Davina Poke D.O.   On: 01/04/2022 11:09   DG Femur Min 2 Views Right  Result Date: 01/03/2022 CLINICAL DATA:  [place], pain EXAM: RIGHT FEMUR 2 VIEWS COMPARISON:  12/29/2021 FINDINGS: Frontal and lateral views of the right femur are obtained. There is an oblique mid femoral diaphyseal fracture with valgus angulation at the fracture site. One shaft width posterior displacement of the distal fracture fragment. There is a permeative moth-eaten appearance of the lateral cortex of the mid femoral diaphysis at the fracture site, suggesting underlying pathologic fracture. The remainder of the right femur is unremarkable. Stable  osteoarthritis within the right knee. Right hip is unremarkable. Diffuse soft tissue swelling. IMPRESSION: 1. Displaced fracture mid right femoral diaphysis as above. There is a permeative moth-eaten appearance of the lateral cortex along the fracture margin, suggesting pathologic fracture in this patient with a known history of breast cancer. Electronically Signed   By: Randa Ngo M.D.   On: 01/03/2022 18:22   DG FEMUR PORT, MIN 2 VIEWS RIGHT  Result Date: 01/04/2022 CLINICAL DATA:  Right femur ORIF  EXAM: RIGHT FEMUR PORTABLE 2 VIEW COMPARISON:  01/03/2022 FINDINGS: Status post ORIF of mid left femoral diaphyseal fracture. Lytic changes with periosteal reaction along the lateral cortex at the fracture site suggestive of a pathologic fracture. Fracture alignment is anatomic. No additional fractures. No additional bone lesion is seen. IMPRESSION: Status post ORIF of mid left femoral diaphyseal fracture, in anatomic alignment. Lytic changes with periosteal reaction along the lateral cortex at the fracture site suggestive of a pathologic fracture. Electronically Signed   By: Davina Poke D.O.   On: 01/04/2022 13:03       Hosie Poisson M.D. [facility] 01/05/2022, 11:53 AM  Available via Epic secure chat 7am-7pm After 7 pm, please refer to night coverage provider listed on amion.

## 2022-01-05 NOTE — Progress Notes (Cosign Needed)
    Durable Medical Equipment  (From admission, onward)           Start     Ordered   01/05/22 1529  For home use only DME Walker rolling  Once       Question Answer Comment  Walker: With 5 Inch Wheels   Patient needs a walker to treat with the following condition Gait abnormality      01/05/22 1529   01/05/22 1529  For home use only DME lightweight manual wheelchair with seat cushion  Once       Comments: Patient suffers from  fracture of R femur  which impairs their ability to perform daily activities like bathing in the home.  A walker will not resolve  issue with performing activities of daily living. A wheelchair will allow patient to safely perform daily activities. Patient is not able to propel themselves in the home using a standard weight wheelchair due to general weakness. Patient can self propel in the lightweight wheelchair. Length of need 6 months . Accessories: elevating leg rests (ELRs), wheel locks, extensions and anti-tippers.   01/05/22 1529

## 2022-01-05 NOTE — Progress Notes (Signed)
Pt stated she has home cpap unit and is able to place herself on when ready for bed.

## 2022-01-05 NOTE — Evaluation (Signed)
Physical Therapy Evaluation Patient Details Name: Laura Bush MRN: [id number] DOB: 09-02-1950 Today's Date: 01/05/2022  History of Present Illness  Pt is a 71 y/o F s/p R femoral Intramedullary nail s/p fall and WBAT to RLE. PMH includes L breast cancer, DM type 2, GERD, Hypercholesteremia, hypertension, and CAD.   Clinical Impression  Received pt semi-reclined in bed with no c/o pain. Pt performed bed mobility with light min A for RLE management due to weakness. Pt performed all transfers and ambulated in room with RW and min A. Pt limited by feeling "too hot" and required rest break in recliner with cool washcloth to forehead. Pt/husband agreeable to HHPT upon D/C but reported needing RW. Acute PT to cont to follow.      Recommendations for follow up therapy are one component of a multi-disciplinary discharge planning process, led by the attending physician.  Recommendations may be updated based on patient status, additional functional criteria and insurance authorization.  Follow Up Recommendations Home health PT    Assistance Recommended at Discharge Intermittent Supervision/Assistance  Patient can return home with the following  A little help with walking and/or transfers;A little help with bathing/dressing/bathroom;Assist for transportation;Help with stairs or ramp for entrance    Equipment Recommendations Rolling walker (2 wheels)  Recommendations for Other Services       Functional Status Assessment Patient has had a recent decline in their functional status and demonstrates the ability to make significant improvements in function in a reasonable and predictable amount of time.     Precautions / Restrictions Precautions Precautions: Fall Restrictions Weight Bearing Restrictions: No (per PA) RLE Weight Bearing: Weight bearing as tolerated      Mobility  Bed Mobility Overal bed mobility: Needs Assistance Bed Mobility: Rolling, Supine to Sit Rolling:  Supervision   Supine to sit: Min assist     General bed mobility comments: flat bed with minor use of bedrails. Pt required light min A to prevent RLE from "dropping" off bed Patient Response: Cooperative  Transfers Overall transfer level: Needs assistance Equipment used: Rolling walker (2 wheels) Transfers: Sit to/from Stand Sit to Stand: Min assist           General transfer comment: stood from EOB with RW and min A    Ambulation/Gait Ambulation/Gait assistance: Herbalist (Feet): 10 Feet Assistive device: Rolling walker (2 wheels) Gait Pattern/deviations: Decreased step length - right, Decreased step length - left, Decreased stance time - right, Decreased stride length, Decreased weight shift to right, Narrow base of support Gait velocity: decreased Gait velocity interpretation: <1.8 ft/sec, indicate of risk for recurrent falls   General Gait Details: hesitant to place full weight on RLE and difficulty getting heel flat on ground. Limited by "feeling hot" and required sitting rest break and cool washcloth.  Stairs            Wheelchair Mobility    Modified Rankin (Stroke Patients Only)       Balance Overall balance assessment: Needs assistance Sitting-balance support: Bilateral upper extremity supported, Feet supported Sitting balance-Leahy Scale: Fair Sitting balance - Comments: static sitting balance EOB with supervision   Standing balance support: Bilateral upper extremity supported, During functional activity, Reliant on assistive device for balance (RW) Standing balance-Leahy Scale: Poor Standing balance comment: pt required min A for dynamic standing balance but able to maintain static standing balance with min guard.  Pertinent Vitals/Pain Pain Assessment Pain Assessment: No/denies pain    Home Living Family/patient expects to be discharged to:: Private residence Living Arrangements:  Spouse/significant other Available Help at Discharge: Family;Available 24 hours/day Type of Home: House Home Access: Stairs to enter Entrance Stairs-Rails: None Entrance Stairs-Number of Steps: 1 small threshold   Home Layout: One level Home Equipment: Cane - single point      Prior Function Prior Level of Function : Independent/Modified Independent;Driving (Retired)                     Journalist, newspaper   Dominant Hand: Right    Extremity/Trunk Assessment   Upper Extremity Assessment Upper Extremity Assessment: Defer to OT evaluation    Lower Extremity Assessment Lower Extremity Assessment: Generalized weakness    Cervical / Trunk Assessment Cervical / Trunk Assessment: Normal  Communication   Communication: No difficulties  Cognition Arousal/Alertness: Awake/alert Behavior During Therapy: WFL for tasks assessed/performed Overall Cognitive Status: Within Functional Limits for tasks assessed                                          General Comments General comments (skin integrity, edema, etc.): husband present at bedside    Exercises     Assessment/Plan    PT Assessment Patient needs continued PT services  PT Problem List Decreased strength;Decreased activity tolerance;Decreased balance;Decreased mobility;Decreased coordination;Decreased range of motion       PT Treatment Interventions DME instruction;Gait training;Stair training;Functional mobility training;Therapeutic activities;Therapeutic exercise;Balance training;Neuromuscular re-education;Patient/family education    PT Goals (Current goals can be found in the Care Plan section)  Acute Rehab PT Goals Patient Stated Goal: to return home PT Goal Formulation: With patient/family Time For Goal Achievement: 01/12/22 Potential to Achieve Goals: Good    Frequency Min 5X/week     Co-evaluation               AM-PAC PT "6 Clicks" Mobility  Outcome Measure Help needed turning  from your back to your side while in a flat bed without using bedrails?: A Little Help needed moving from lying on your back to sitting on the side of a flat bed without using bedrails?: A Little Help needed moving to and from a bed to a chair (including a wheelchair)?: A Little Help needed standing up from a chair using your arms (e.g., wheelchair or bedside chair)?: A Little Help needed to walk in hospital room?: A Little Help needed climbing 3-5 steps with a railing? : A Little 6 Click Score: 18    End of Session Equipment Utilized During Treatment: Gait belt Activity Tolerance: Patient tolerated treatment well;Patient limited by fatigue Patient left: in chair;with call bell/phone within reach;with chair alarm set;with family/visitor present Nurse Communication: Mobility status PT Visit Diagnosis: Unsteadiness on feet (R26.81);Muscle weakness (generalized) (M62.81);History of falling (Z91.81)    Time: 0733-0800 PT Time Calculation (min) (ACUTE ONLY): 27 min   Charges:   PT Evaluation $PT Eval Low Complexity: 1 Low PT Treatments $Gait Training: 8-22 mins        Becky Sax PT, DPT  Blenda Nicely 01/05/2022, 9:45 AM

## 2022-01-05 NOTE — Evaluation (Signed)
Occupational Therapy Evaluation/Discharge Patient Details Name: Laura Bush MRN: [id number] DOB: August 10, 1950 Today's Date: 01/05/2022   History of Present Illness Pt is a 71 y/o F s/p R femoral Intramedullary nail s/p fall and WBAT to RLE. PMH includes L breast cancer, DM type 2, GERD, Hypercholesteremia, hypertension, and CAD.   Clinical Impression   PTA, pt lives with spouse, typically Independent in all daily tasks without use of AD. Pt presents now with no pain, able to mobilize in room using RW at Supervision level, and able to complete entirety of ADLs standing at sink without physical assistance. Educated re: helpful DME, fall prevention at home, and strategies to manage with RW around household. Pt verbalized understanding of all education, endorses husband will be able to assist as needed at home, and would need to acquire RW prior to DC home. No further skilled OT services needed at acute level or on DC. Recommend pt to work with mobility specialists during admission.      Recommendations for follow up therapy are one component of a multi-disciplinary discharge planning process, led by the attending physician.  Recommendations may be updated based on patient status, additional functional criteria and insurance authorization.   Follow Up Recommendations  No OT follow up    Assistance Recommended at Discharge Set up Supervision/Assistance  Patient can return home with the following A little help with bathing/dressing/bathroom;Assistance with cooking/housework;Assist for transportation    Functional Status Assessment  Patient has had a recent decline in their functional status and demonstrates the ability to make significant improvements in function in a reasonable and predictable amount of time.  Equipment Recommendations  Other (comment) (Rolling walker)    Recommendations for Other Services       Precautions / Restrictions Precautions Precautions:  Fall Restrictions Weight Bearing Restrictions: Yes RLE Weight Bearing: Weight bearing as tolerated      Mobility Bed Mobility               General bed mobility comments: up in chair on entry    Transfers Overall transfer level: Modified independent Equipment used: Rolling walker (2 wheels) Transfers: Sit to/from Stand Sit to Stand: Modified independent (Device/Increase time)           General transfer comment: no assist to stand from recliner or toilet with RW      Balance Overall balance assessment: Needs assistance Sitting-balance support: Bilateral upper extremity supported, Feet supported Sitting balance-Leahy Scale: Good     Standing balance support: Single extremity supported, Bilateral upper extremity supported, During functional activity Standing balance-Leahy Scale: Fair Standing balance comment: able to statically stand at sink to brush teeth, bathe self without UE support. RW helpful for mobility                           ADL either performed or assessed with clinical judgement   ADL Overall ADL's : Needs assistance/impaired Eating/Feeding: Independent   Grooming: Standing;Modified independent;Oral care   Upper Body Bathing: Modified independent;Standing   Lower Body Bathing: Supervison/ safety;Sit to/from stand Lower Body Bathing Details (indicate cue type and reason): able to bathe peri region in standing with good safety awareness Upper Body Dressing : Modified independent;Standing   Lower Body Dressing: Supervision/safety;Sit to/from stand Lower Body Dressing Details (indicate cue type and reason): able to bend to easily reach B socks Toilet Transfer: Supervision/safety;Ambulation;Rolling walker (2 wheels);Comfort height toilet Toilet Transfer Details (indicate cue type and reason): able to power up from  toilet easily with use of grab bar (toilet at home is higher) Toileting- Clothing Manipulation and Hygiene:  Supervision/safety;Sit to/from stand       Functional mobility during ADLs: Supervision/safety;Rolling walker (2 wheels) General ADL Comments: No pain with activity and moving well.     Vision Baseline Vision/History: 1 Wears glasses Ability to See in Adequate Light: 0 Adequate Patient Visual Report: No change from baseline Vision Assessment?: No apparent visual deficits     Perception     Praxis      Pertinent Vitals/Pain Pain Assessment Pain Assessment: No/denies pain     Hand Dominance Right   Extremity/Trunk Assessment Upper Extremity Assessment Upper Extremity Assessment: Overall WFL for tasks assessed   Lower Extremity Assessment Lower Extremity Assessment: Defer to PT evaluation (reports chronic R knee hyperextension issues)   Cervical / Trunk Assessment Cervical / Trunk Assessment: Normal   Communication Communication Communication: No difficulties   Cognition Arousal/Alertness: Awake/alert Behavior During Therapy: WFL for tasks assessed/performed Overall Cognitive Status: Within Functional Limits for tasks assessed                                       General Comments  husband present at bedside    Exercises     Shoulder Instructions      Home Living Family/patient expects to be discharged to:: Private residence Living Arrangements: Spouse/significant other Available Help at Discharge: Family;Available 24 hours/day Type of Home: House Home Access: Stairs to enter [employer] of Steps: 1 small threshold Entrance Stairs-Rails: None Home Layout: One level     Bathroom Shower/Tub: Occupational psychologist: Handicapped height Bathroom Accessibility: Yes   Home Equipment: Cane - single point          Prior Functioning/Environment Prior Level of Function : Independent/Modified Independent;Driving             Mobility Comments: no use of AD ADLs Comments: Independent with ADLs, IADls, driving. enjoys  reading, quilting, yoga        OT Problem List:        OT Treatment/Interventions:      OT Goals(Current goals can be found in the care plan section) Acute Rehab OT Goals Patient Stated Goal: [place] OT Goal Formulation: All assessment and education complete, DC therapy  OT Frequency:      Co-evaluation              AM-PAC OT "6 Clicks" Daily Activity     Outcome Measure Help from another person eating meals?: None Help from another person taking care of personal grooming?: None Help from another person toileting, which includes using toliet, bedpan, or urinal?: A Little Help from another person bathing (including washing, rinsing, drying)?: A Little Help from another person to put on and taking off regular upper body clothing?: None Help from another person to put on and taking off regular lower body clothing?: A Little 6 Click Score: 21   End of Session Equipment Utilized During Treatment: Rolling walker (2 wheels) Nurse Communication: Mobility status  Activity Tolerance: Patient tolerated treatment well Patient left: in chair;with call bell/phone within reach;with family/visitor present;Other (comment) (PA present)  OT Visit Diagnosis: Other abnormalities of gait and mobility (R26.89)                Time: 8527-7824 OT Time Calculation (min): 29 min  Charges:  OT General Charges $OT Visit: 1 Visit OT Evaluation $OT Eval Low Complexity: 1 Low OT Treatments $Self Care/Home Management : 8-22 mins  Malachy Chamber, OTR/L Acute Rehab Services Office: [phone]   [name] 01/05/2022, 10:05 AM

## 2022-01-06 ENCOUNTER — Encounter (HOSPITAL_COMMUNITY)
Admission: EM | Disposition: A | Discharge status: Home Health | Payer: Self-pay | Source: Home / Self Care | Attending: Internal Medicine

## 2022-01-06 ENCOUNTER — Encounter (HOSPITAL_COMMUNITY): Payer: Self-pay | Admitting: Internal Medicine

## 2022-01-06 ENCOUNTER — Inpatient Hospital Stay (HOSPITAL_COMMUNITY): Payer: Medicare HMO | Admitting: Anesthesiology

## 2022-01-06 ENCOUNTER — Inpatient Hospital Stay (HOSPITAL_COMMUNITY): Payer: Medicare HMO

## 2022-01-06 ENCOUNTER — Other Ambulatory Visit: Payer: Self-pay | Admitting: Oncology

## 2022-01-06 DIAGNOSIS — M84551A Pathological fracture in neoplastic disease, right femur, initial encounter for fracture: Secondary | ICD-10-CM | POA: Diagnosis not present

## 2022-01-06 DIAGNOSIS — Z170 Estrogen receptor positive status [ER+]: Secondary | ICD-10-CM

## 2022-01-06 DIAGNOSIS — G4733 Obstructive sleep apnea (adult) (pediatric): Secondary | ICD-10-CM | POA: Diagnosis not present

## 2022-01-06 DIAGNOSIS — R918 Other nonspecific abnormal finding of lung field: Secondary | ICD-10-CM

## 2022-01-06 DIAGNOSIS — I10 Essential (primary) hypertension: Secondary | ICD-10-CM

## 2022-01-06 DIAGNOSIS — Z9989 Dependence on other enabling machines and devices: Secondary | ICD-10-CM

## 2022-01-06 HISTORY — PX: BRONCHIAL BIOPSY: SHX5109

## 2022-01-06 HISTORY — PX: BRONCHIAL BRUSHINGS: SHX5108

## 2022-01-06 HISTORY — PX: FIDUCIAL MARKER PLACEMENT: SHX6858

## 2022-01-06 HISTORY — PX: BRONCHIAL NEEDLE ASPIRATION BIOPSY: SHX5106

## 2022-01-06 HISTORY — PX: BRONCHIAL WASHINGS: SHX5105

## 2022-01-06 LAB — CBC
HCT: 28.4 % — ABNORMAL LOW (ref 36.0–46.0)
Hemoglobin: 10 g/dL — ABNORMAL LOW (ref 12.0–15.0)
MCH: 33.7 pg (ref 26.0–34.0)
MCHC: 35.2 g/dL (ref 30.0–36.0)
MCV: 95.6 fL (ref 80.0–100.0)
Platelets: 146 10*3/uL — ABNORMAL LOW (ref 150–400)
RBC: 2.97 MIL/uL — ABNORMAL LOW (ref 3.87–5.11)
RDW: 12 % (ref 11.5–15.5)
WBC: 5.3 10*3/uL (ref 4.0–10.5)
nRBC: 0 % (ref 0.0–0.2)

## 2022-01-06 LAB — GLUCOSE, CAPILLARY
Glucose-Capillary: 92 mg/dL (ref 70–99)
Glucose-Capillary: 91 mg/dL (ref 70–99)
Glucose-Capillary: 113 mg/dL — ABNORMAL HIGH (ref 70–99)
Glucose-Capillary: 165 mg/dL — ABNORMAL HIGH (ref 70–99)
Glucose-Capillary: 122 mg/dL — ABNORMAL HIGH (ref 70–99)

## 2022-01-06 SURGERY — BRONCHOSCOPY, WITH BIOPSY USING ELECTROMAGNETIC NAVIGATION
Anesthesia: General | Laterality: Left

## 2022-01-06 MED ORDER — SUGAMMADEX SODIUM 200 MG/2ML IV SOLN
INTRAVENOUS | Status: DC | PRN
  Administered 2022-01-06: 200 mg via INTRAVENOUS

## 2022-01-06 MED ORDER — PHENYLEPHRINE HCL-NACL 20-0.9 MG/250ML-% IV SOLN
INTRAVENOUS | Status: DC | PRN
  Administered 2022-01-06: 30 ug/min via INTRAVENOUS

## 2022-01-06 MED ORDER — ROCURONIUM BROMIDE 10 MG/ML (PF) SYRINGE
PREFILLED_SYRINGE | INTRAVENOUS | Status: DC | PRN
  Administered 2022-01-06: 10 mg via INTRAVENOUS
  Administered 2022-01-06: 80 mg via INTRAVENOUS

## 2022-01-06 MED ORDER — FENTANYL CITRATE (PF) 250 MCG/5ML IJ SOLN: INTRAMUSCULAR | Status: DC | PRN

## 2022-01-06 MED ORDER — ENOXAPARIN SODIUM 40 MG/0.4ML IJ SOSY
40.0000 mg | PREFILLED_SYRINGE | INTRAMUSCULAR | Status: DC
Start: 2022-01-06 — End: 2022-01-07
  Administered 2022-01-06: 40 mg via SUBCUTANEOUS
  Filled 2022-01-06: qty 0.4

## 2022-01-06 MED ORDER — PHENYLEPHRINE 80 MCG/ML (10ML) SYRINGE FOR IV PUSH (FOR BLOOD PRESSURE SUPPORT)
PREFILLED_SYRINGE | INTRAVENOUS | Status: DC | PRN
  Administered 2022-01-06 (×2): 160 ug via INTRAVENOUS
  Administered 2022-01-06 (×2): 80 ug via INTRAVENOUS

## 2022-01-06 MED ORDER — LIDOCAINE 2% (20 MG/ML) 5 ML SYRINGE
INTRAMUSCULAR | Status: DC | PRN
  Administered 2022-01-06: 100 mg via INTRAVENOUS

## 2022-01-06 MED ORDER — POLYETHYLENE GLYCOL 3350 17 G PO PACK
17.0000 g | PACK | Freq: Every day | ORAL | Status: DC
  Administered 2022-01-06 – 2022-01-07 (×2): 17 g via ORAL
  Filled 2022-01-06 (×3): qty 1

## 2022-01-06 MED ORDER — SENNOSIDES-DOCUSATE SODIUM 8.6-50 MG PO TABS
2.0000 | ORAL_TABLET | Freq: Two times a day (BID) | ORAL | Status: DC
  Administered 2022-01-06 – 2022-01-07 (×2): 2 via ORAL
  Filled 2022-01-06 (×2): qty 2

## 2022-01-06 MED ORDER — CHLORHEXIDINE GLUCONATE 0.12 % MT SOLN
OROMUCOSAL | Status: AC
  Administered 2022-01-06: 15 mL via OROMUCOSAL
  Filled 2022-01-06: qty 15

## 2022-01-06 MED ORDER — ONDANSETRON HCL 4 MG/2ML IJ SOLN
INTRAMUSCULAR | Status: DC | PRN
  Administered 2022-01-06: 4 mg via INTRAVENOUS

## 2022-01-06 MED ORDER — LACTATED RINGERS IV SOLN: INTRAVENOUS | Status: DC

## 2022-01-06 MED ORDER — FENTANYL CITRATE (PF) 100 MCG/2ML IJ SOLN
INTRAMUSCULAR | Status: DC | PRN
  Administered 2022-01-06: 100 ug via INTRAVENOUS

## 2022-01-06 MED ORDER — PROPOFOL 10 MG/ML IV BOLUS
INTRAVENOUS | Status: DC | PRN
  Administered 2022-01-06: 110 mg via INTRAVENOUS

## 2022-01-06 MED ORDER — CHLORHEXIDINE GLUCONATE 0.12 % MT SOLN: 15.0000 mL | OROMUCOSAL | Status: AC

## 2022-01-06 MED ORDER — DEXAMETHASONE SODIUM PHOSPHATE 10 MG/ML IJ SOLN
INTRAMUSCULAR | Status: DC | PRN
  Administered 2022-01-06: 10 mg via INTRAVENOUS

## 2022-01-06 SURGICAL SUPPLY — 2 items
"fiducial " IMPLANT
fiducial ×1 IMPLANT

## 2022-01-06 NOTE — Anesthesia Preprocedure Evaluation (Signed)
Anesthesia Evaluation  Patient identified by MRN, date of birth, ID band Patient awake    Reviewed: Allergy & Precautions, NPO status , Patient's Chart, lab work & pertinent test results  History of Anesthesia Complications (+) PONV and history of anesthetic complications  Airway Mallampati: III  TM Distance: <3 FB Neck ROM: Full    Dental no notable dental hx.    Pulmonary asthma , sleep apnea and Continuous Positive Airway Pressure Ventilation ,    Pulmonary exam normal breath sounds clear to auscultation       Cardiovascular hypertension, Normal cardiovascular exam Rhythm:Regular Rate:Normal     Neuro/Psych negative neurological ROS  negative psych ROS   GI/Hepatic negative GI ROS, Neg liver ROS,   Endo/Other  negative endocrine ROS  Renal/GU negative Renal ROS  negative genitourinary   Musculoskeletal negative musculoskeletal ROS (+)   Abdominal   Peds negative pediatric ROS (+)  Hematology negative hematology ROS (+)   Anesthesia Other Findings   Reproductive/Obstetrics negative OB ROS                             Anesthesia Physical Anesthesia Plan  ASA: 3  Anesthesia Plan: General   Post-op Pain Management:    Induction: Intravenous  PONV Risk Score and Plan: 4 or greater and Ondansetron, Dexamethasone, Scopolamine patch - Pre-op and Treatment may vary due to age or medical condition  Airway Management Planned: Oral ETT  Additional Equipment:   Intra-op Plan:   Post-operative Plan: Extubation in OR  Informed Consent: I have reviewed the patients History and Physical, chart, labs and discussed the procedure including the risks, benefits and alternatives for the proposed anesthesia with the patient or authorized representative who has indicated his/her understanding and acceptance.     Dental advisory given  Plan Discussed with: CRNA and Surgeon  Anesthesia Plan  Comments:         Anesthesia Quick Evaluation

## 2022-01-06 NOTE — Anesthesia Procedure Notes (Signed)
Procedure Name: Intubation Date/Time: 01/06/2022 7:49 AM Performed by: Kyung Rudd, CRNA Pre-anesthesia Checklist: Patient identified, Emergency Drugs available, Suction available and Patient being monitored Patient Re-evaluated:Patient Re-evaluated prior to induction Oxygen Delivery Method: Circle system utilized Preoxygenation: Pre-oxygenation with 100% oxygen Induction Type: IV induction Ventilation: Mask ventilation without difficulty Laryngoscope Size: Mac and 4 Grade View: Grade II Tube type: Oral Tube size: 8.5 mm Number of attempts: 1 Airway Equipment and Method: Stylet Placement Confirmation: ETT inserted through vocal cords under direct vision, positive ETCO2 and breath sounds checked- equal and bilateral Secured at: 21 cm Tube secured with: Tape Dental Injury: Teeth and Oropharynx as per pre-operative assessment

## 2022-01-06 NOTE — Transfer of Care (Signed)
Immediate Anesthesia Transfer of Care Note  Patient: Candid Watson [name]  Procedure(s) Performed: ROBOTIC ASSISTED NAVIGATIONAL BRONCHOSCOPY (Left) BRONCHIAL BRUSHINGS BRONCHIAL BIOPSIES BRONCHIAL NEEDLE ASPIRATION BIOPSIES BRONCHIAL WASHINGS FIDUCIAL MARKER PLACEMENT  Patient Location: PACU  Anesthesia Type:General  Level of Consciousness: awake, alert  and oriented  Airway & Oxygen Therapy: Patient Spontanous Breathing  Post-op Assessment: Report given to RN, Post -op Vital signs reviewed and stable and Patient moving all extremities X 4  Post vital signs: Reviewed and stable  Last Vitals:  Vitals Value Taken Time  BP 134/66 01/06/22 0953  Temp    Pulse 94 01/06/22 0954  Resp 18 01/06/22 0954  SpO2 95 % 01/06/22 0954  Vitals shown include unvalidated device data.  Last Pain:  Vitals:   01/06/22 0730  TempSrc:   PainSc: 0-No pain      Patients Stated Pain Goal: 0 (92/76/39 4320)  Complications: No notable events documented.

## 2022-01-06 NOTE — Op Note (Signed)
Video Bronchoscopy with Robotic Assisted Bronchoscopic Navigation   Date of Operation: 01/06/2022   Pre-op Diagnosis: Left lower lobe pulmonary nodules  Post-op Diagnosis: Same  Surgeon: Baltazar Apo  Assistants: None  Anesthesia: General endotracheal anesthesia  Operation: Flexible video fiberoptic bronchoscopy with robotic assistance and biopsies.  Estimated Blood Loss: Minimal  Complications: None  Indications and History: Laura Bush is a 71 y.o. female with history of breast cancer 7 years ago.  She was admitted with a pathologic fracture of the right lower extremity.  Survey CT scanning revealed 2 small left lower lobe pulmonary nodules.  Recommendation made to achieve a tissue diagnosis via navigational bronchoscopy. The risks, benefits, complications, treatment options and expected outcomes were discussed with the patient.  The possibilities of pneumothorax, pneumonia, reaction to medication, pulmonary aspiration, perforation of a viscus, bleeding, failure to diagnose a condition and creating a complication requiring transfusion or operation were discussed with the patient who freely signed the consent.    Description of Procedure: The patient was seen in the Preoperative Area, was examined and was deemed appropriate to proceed.  The patient was taken to [facility] endoscopy room 3, identified as Laura Bush and the procedure verified as Flexible Video Fiberoptic Bronchoscopy.  A Time Out was held and the above information confirmed.   Prior to the date of the procedure a high-resolution CT scan of the chest was performed. Utilizing ION software program a virtual tracheobronchial tree was generated to allow the creation of distinct navigation pathways to the patient's parenchymal abnormalities. After being taken to the operating room general anesthesia was initiated and the patient  was orally intubated. The video fiberoptic bronchoscope was introduced via the endotracheal  tube and a general inspection was performed which showed normal right and left lung anatomy. Aspiration of the bilateral mainstems was completed to remove any remaining secretions. Robotic catheter inserted into patient's endotracheal tube.   Target #1 left lower lobe superior segment pulmonary nodule: The distinct navigation pathways prepared prior to this procedure were then utilized to navigate to patient's lesion identified on CT scan. The robotic catheter was secured into place and the vision probe was withdrawn.  Lesion location was approximated using fluoroscopy and radial endobronchial ultrasound for peripheral targeting.  Local registration and targeting was performed using Cios three-dimensional imaging.  Under fluoroscopic guidance transbronchial needle brushings, transbronchial needle biopsies, and transbronchial forceps biopsies were performed to be sent for cytology and pathology. A bronchioalveolar lavage was performed in the superior segment of the left lower lobe and sent for cytology.  Under fluoroscopic guidance a single fiducial marker was placed adjacent to the nodule.  Navigation was then attempted to approach the smaller left lower lobe pulmonary nodule (target #2).  The nodule could not be identified on standard fluoroscopy or on Cios three-dimensional imaging.  No biopsies were performed at this location.  At the end of the procedure a general airway inspection was performed and there was no evidence of active bleeding. The bronchoscope was removed.  The patient tolerated the procedure well. There was no significant blood loss and there were no obvious complications. A post-procedural chest x-ray is pending.  Samples Target #1: 1. Transbronchial needle brushings from left lower lobe superior segment nodule 2. Transbronchial Wang needle biopsies from left lower lobe superior segment nodule 3. Transbronchial forceps biopsies from left lower lobe superior segment nodule 4.  Bronchoalveolar lavage from left lower lobe superior segment  Plans:  The patient will be transferred from the PACU to  medical floor when recovered from anesthesia and after chest x-ray is reviewed. We will review the cytology, pathology and microbiology results with the patient when they become available. Outpatient followup will be with Dr. Lamonte Sakai and with oncology.   Baltazar Apo, MD, PhD 01/06/2022, 9:46 AM [facility] [phone] or if no answer before 7:00PM call [phone] For any issues after 7:00PM please call eLink [phone]

## 2022-01-06 NOTE — Progress Notes (Signed)
Physical Therapy Treatment Patient Details Name: Consepcion Watson Bush MRN: [id number] DOB: October 18, 1950 Today's Date: 01/06/2022   History of Present Illness Pt is a 71 y/o female admitted 01/03/22 s/p mechanical fall resulting in R fem shaft fx. Pt underwent IM nailing on 5/30. S/p bronchoscopy with biopsies of L lower lobe pulmonary nodules 6/1. PMH includes L breast cancer, DM type 2, GERD, Hypercholesteremia, hypertension, and CAD    PT Comments    Pt is making great progress with mobility, ambulating x3 bouts of ~15 ft > ~165 ft > ~305 ft with a RW at a min guard-supervision level and navigating x4 stairs sideways with bil UE support on handrail and min guard assist. Pt reported she needs to be able to navigate x3 flights of stairs to access the [place] in ~2 weeks. She reports she will have lots of family support and a landing every ~1/2 flight of stairs where she can sit to rest as needed. Will continue to follow acutely. Current recommendations remain appropriate.     Recommendations for follow up therapy are one component of a multi-disciplinary discharge planning process, led by the attending physician.  Recommendations may be updated based on patient status, additional functional criteria and insurance authorization.  Follow Up Recommendations  Home health PT     Assistance Recommended at Discharge Intermittent Supervision/Assistance  Patient can return home with the following A little help with bathing/dressing/bathroom;Assist for transportation;Help with stairs or ramp for entrance;Assistance with cooking/housework   Equipment Recommendations  Rolling walker (2 wheels)    Recommendations for Other Services       Precautions / Restrictions Precautions Precautions: Fall Restrictions Weight Bearing Restrictions: Yes RLE Weight Bearing: Weight bearing as tolerated     Mobility  Bed Mobility Overal bed mobility:  Needs Assistance Bed Mobility: Supine to Sit     Supine to sit: Supervision, HOB elevated     General bed mobility comments: Extra time with HOB eleavted to transition legs off EOB to come to sit R EOB.    Transfers Overall transfer level: Needs assistance Equipment used: Rolling walker (2 wheels) Transfers: Sit to/from Stand Sit to Stand: Min guard           General transfer comment: Min guard for safety, no LOB, needs cues to not pull up on RW with both UEs but rather push up from sitting surface with at least 1 UE for safety, good carryover noted    Ambulation/Gait Ambulation/Gait assistance: Min guard, Supervision Gait Distance (Feet): 305 Feet (x3 bouts of ~15 ft > ~165 ft > ~305 ft) Assistive device: Rolling walker (2 wheels) Gait Pattern/deviations: Decreased step length - right, Decreased step length - left, Decreased stance time - right, Decreased stride length, Decreased weight shift to right, Narrow base of support, Step-through pattern, Antalgic Gait velocity: decreased Gait velocity interpretation: <1.8 ft/sec, indicate of risk for recurrent falls   General Gait Details: Pt with slow, fairly steady antalgic gait pattern. No LOB, min guard for safety. As distance progressed, her step-through and step length improved with less reliance on UEs, progressed to supervision   Stairs Stairs: Yes Stairs assistance: Min guard Stair Management: One rail Right, One rail Left, Step to pattern, Sideways Number of Stairs: 4 General stair comments: Ascends sideways using bil UEs on R rail, leading up with L leg. Descends sideways using bil UEs on L rail, leading down with R leg. No LOB, extra time,  min guard for safety and to cue pt to ensure she provided enough room on each step for the other foot.   Wheelchair Mobility    Modified Rankin (Stroke Patients Only)       Balance Overall balance assessment: Needs assistance Sitting-balance support: Feet supported, No upper  extremity supported Sitting balance-Leahy Scale: Good Sitting balance - Comments: Long sitting donning socks no LOB   Standing balance support: No upper extremity supported, Bilateral upper extremity supported, During functional activity Standing balance-Leahy Scale: Fair Standing balance comment: Able to manage underwear in standing without UE support, mild trunk sway noted. Benefits from RW for mobility                            Cognition Arousal/Alertness: Awake/alert Behavior During Therapy: WFL for tasks assessed/performed Overall Cognitive Status: Within Functional Limits for tasks assessed                                          Exercises      General Comments        Pertinent Vitals/Pain Pain Assessment Pain Assessment: Faces Faces Pain Scale: Hurts a little bit Pain Location: R leg Pain Descriptors / Indicators: Discomfort, Operative site guarding Pain Intervention(s): Limited activity within patient's tolerance, Monitored during session, Repositioned    Home Living                          Prior Function            PT Goals (current goals can now be found in the care plan section) Acute Rehab PT Goals Patient Stated Goal: to go home PT Goal Formulation: With patient/family Time For Goal Achievement: 01/12/22 Potential to Achieve Goals: Good Progress towards PT goals: Progressing toward goals    Frequency    Min 5X/week      PT Plan Current plan remains appropriate    Co-evaluation              AM-PAC PT "6 Clicks" Mobility   Outcome Measure  Help needed turning from your back to your side while in a flat bed without using bedrails?: A Little Help needed moving from lying on your back to sitting on the side of a flat bed without using bedrails?: A Little Help needed moving to and from a bed to a chair (including a wheelchair)?: A Little Help needed standing up from a chair using your arms (e.g.,  wheelchair or bedside chair)?: A Little Help needed to walk in hospital room?: A Little Help needed climbing 3-5 steps with a railing? : A Little 6 Click Score: 18    End of Session   Activity Tolerance: Patient tolerated treatment well Patient left: in chair;with call bell/phone within reach;with family/visitor present Nurse Communication: Mobility status;Other (comment) (pt reporting no BM since Monday) PT Visit Diagnosis: Unsteadiness on feet (R26.81);Muscle weakness (generalized) (M62.81);History of falling (Z91.81);Other abnormalities of gait and mobility (R26.89);Difficulty in walking, not elsewhere classified (R26.2)     Time: 2595-6387 PT Time Calculation (min) (ACUTE ONLY): 41 min  Charges:  $Gait Training: 38-52 mins                     Moishe Spice, PT, DPT Acute Rehabilitation Services  Pager: [phone] Office: [phone]    Laura Bees  Bush 01/06/2022, 3:51 PM

## 2022-01-06 NOTE — Interval H&P Note (Signed)
History and Physical Interval Note:  01/06/2022 7:28 AM  Laura Bush  has presented today for surgery, with the diagnosis of LUL/LLL nodule.  The various methods of treatment have been discussed with the patient and family. After consideration of risks, benefits and other options for treatment, the patient has consented to  Procedure(s): ROBOTIC ASSISTED NAVIGATIONAL BRONCHOSCOPY (Left) as a surgical intervention.  The patient's history has been reviewed, patient examined, no change in status, stable for surgery.  I have reviewed the patient's chart and labs.  Questions were answered to the patient's satisfaction.     Collene Gobble

## 2022-01-06 NOTE — Progress Notes (Signed)
Patient has home unit CPAP at beside. Patient able to place on and off when ready.

## 2022-01-06 NOTE — Progress Notes (Signed)
[facility]                                                                               Laura Bush, is a 71 y.o. female, DOB - 07/10/1951, TGY:[id number] Admit date - 01/03/2022    Outpatient Primary MD for the patient is Copland, Gay Filler, MD  LOS - 3  days    Brief summary   41yow PMH left breast cancer status post lumpectomy, XRT 2016, right breast ductal carcinoma in situ; released from oncology. Care 2021, was admitted 5/29 when she slipped in the bathroom and fell resulting in leg pain. Imaging revealed pathologic right femur fx. S/p surgery 5/30 w/ bone biopsy pending. Tentative plan for biopsy LLL via bronchoscopy later this weak. Wants to follow-up with [facility].   Assessment & Plan    Assessment and Plan: * Pathologic fracture of femur ([place]) --s/p low energy impact in patient w/ history of breast cancer; X-rays revealed lytic lesion lateral femoral cortex. CT C/A/P showed lung mass. CT R femur showed pathologic fracture. --s/p surgery 5/30, bone biopsy pending --management per orthopedics - pain is adequately controlled.  - therapy evaluations on board recommending supervision and assistance.  - plan for discharge in am.  - please follow up with Dr Melida Gimenez for the biopsy results.   Mass of lower lobe of left lung --Irregular mass, with nodule inferior to it. Gross appearance is more suggestive of a bronchogenic neoplasm with intralobe synchronous primary or metastasis, but both could be metastatic from an extrathoracic primary in this patient with history of left breast cancer.  --PET-CT recommended --underwent navigational biopsy of the left lower lobe of the pulmonary nodule.  --will need outpatient follow-up with Dr Marin Olp / Judson Roch for the biopsy results.   DM (diabetes mellitus), type 2 ([place]) adequately controlled CBG'S.  NON insulin dependent.  CBG (last 3)  Recent Labs    01/06/22 0434 01/06/22 0723 01/06/22 1130  GLUCAP  92 91 113*    Resume SSI. No changes in meds.   Laceration of left eyebrow --s/p Prolene sutures x6 in ED 5/29 --remove sutures in 5-7 days 6/3-6/5  GERD (gastroesophageal reflux disease) Stable, continue with Protonix.   Hypercholesteremia On statin, continue the same.   Essential hypertension BP parameters are adequately controlled.   CAD (coronary artery disease) --seen on chest CT; follow-up as an outpatient  Constipation:  Start on senna and colace and miralax.     Estimated body mass index is 33.44 kg/m as calculated from the following:   Height as of this encounter: 4\' 10"  (1.473 m).   Weight as of this encounter: 72.6 kg.  Code Status: full code.  DVT Prophylaxis:  enoxaparin (LOVENOX) injection 40 mg Start: 01/06/22 1300 SCDs Start: 01/04/22 1216 SCDs Start: 01/03/22 2205   Level of Care: Level of care: Med-Surg Family Communication: Updated patient's family at bedside.   Disposition Plan:     Remains inpatient appropriate:  d/c in am.   Procedures:  Open biopsy the pathological fracture.  IM nail placement on right femur  navigational bronchoscopy scheduled for tomorrow.   Consultants:   Orthopedics.  PCCM.   Antimicrobials:  Anti-infectives (From admission, onward)    Start     Dose/Rate Route Frequency Ordered Stop   01/04/22 1800  ceFAZolin (ANCEF) IVPB 2g/100 mL premix        2 g 200 mL/hr over 30 Minutes Intravenous Every 6 hours 01/04/22 1215 01/05/22 0046   01/04/22 1230  ceFAZolin (ANCEF) IVPB 2g/100 mL premix  Status:  Discontinued        2 g 200 mL/hr over 30 Minutes Intravenous On call to O.R. 01/04/22 1215 01/04/22 1256   01/04/22 0744  ceFAZolin (ANCEF) 2-4 GM/100ML-% IVPB       Note to Pharmacy: Ladoris Gene A: cabinet override      01/04/22 0744 01/04/22 1959        Medications  Scheduled Meds:  enoxaparin (LOVENOX) injection  40 mg Subcutaneous Q24H   insulin aspart  0-9 Units Subcutaneous Q4H   mupirocin ointment   1 application. Nasal BID   pantoprazole  40 mg Oral Daily   polyethylene glycol  17 g Oral Daily   senna-docusate  2 tablet Oral BID   Continuous Infusions:  lactated ringers 10 mL/hr at 01/04/22 0907   methocarbamol (ROBAXIN) IV     PRN Meds:.HYDROcodone-acetaminophen, HYDROmorphone (DILAUDID) injection, menthol-cetylpyridinium **OR** phenol, methocarbamol **OR** methocarbamol (ROBAXIN) IV, morphine injection, ondansetron **OR** ondansetron (ZOFRAN) IV    Subjective:   Laura Bush was seen and examined today. Constipated.  Objective:   Vitals:   01/06/22 0953 01/06/22 1008 01/06/22 1023 01/06/22 1358  BP: 134/66 136/64 128/67 (!) 124/57  Pulse: 89 85 83 85  Resp: 13 17 14 18   Temp: 98.5 F (36.9 C)  98.5 F (36.9 C) 97.9 F (36.6 C)  TempSrc:    Oral  SpO2: 95% 97% 95% 98%  Weight:      Height:        Intake/Output Summary (Last 24 hours) at 01/06/2022 1514 Last data filed at 01/06/2022 0950 Gross per 24 hour  Intake 600 ml  Output 10 ml  Net 590 ml    Filed Weights   01/03/22 1809 01/04/22 0754 01/06/22 0716  Weight: 74.8 kg 74.8 kg 72.6 kg     Exam General exam: Appears calm and comfortable  Respiratory system: Clear to auscultation. Respiratory effort normal. Cardiovascular system: S1 & S2 heard, RRR. No JVD, No pedal edema. Gastrointestinal system: Abdomen is nondistended, soft and nontender.  Normal bowel sounds heard. Central nervous system: Alert and oriented. No focal neurological deficits. Extremities: Symmetric 5 x 5 power. Skin: No rashes, lesions or ulcers Psychiatry: Mood & affect appropriate.     Data Reviewed:  I have personally reviewed following labs and imaging studies   CBC Lab Results  Component Value Date   WBC 5.3 01/06/2022   RBC 2.97 (L) 01/06/2022   HGB 10.0 (L) 01/06/2022   HCT 28.4 (L) 01/06/2022   MCV 95.6 01/06/2022   MCH 33.7 01/06/2022   PLT 146 (L) 01/06/2022   MCHC 35.2 01/06/2022   RDW 12.0 01/06/2022    LYMPHSABS 0.4 (L) 01/04/2022   MONOABS 0.4 01/04/2022   EOSABS 0.0 01/04/2022   BASOSABS 0.0 15/40/0867     Last metabolic panel Lab Results  Component Value Date   NA 137 01/04/2022   K 4.1 01/04/2022   CL 103 01/04/2022   CO2 24 01/04/2022   BUN 12 01/04/2022   CREATININE 0.89 01/04/2022   GLUCOSE 116 (H) 01/04/2022   GFRNONAA >60 01/04/2022   GFRAA >60 10/03/2019   CALCIUM 9.2 01/04/2022  PHOS 3.4 01/03/2022   PROT 6.3 (L) 01/04/2022   ALBUMIN 3.7 01/04/2022   BILITOT 0.6 01/04/2022   ALKPHOS 60 01/04/2022   AST 40 01/04/2022   ALT 25 01/04/2022   ANIONGAP 10 01/04/2022    CBG (last 3)  Recent Labs    01/06/22 0434 01/06/22 0723 01/06/22 1130  GLUCAP 92 91 113*       Coagulation Profile: Recent Labs  Lab 01/03/22 2222  INR 1.5*      Radiology Studies: [facility] Chest Port 1 View  Result Date: 01/06/2022 CLINICAL DATA:  Status post bronchoscopy. EXAM: PORTABLE CHEST 1 VIEW COMPARISON:  [name] 03, 2022. FINDINGS: Stable cardiomediastinal silhouette. Minimal right midlung subsegmental atelectasis is noted. Left lung is clear. No pneumothorax is noted. The visualized skeletal structures are unremarkable. IMPRESSION: Minimal right midlung subsegmental atelectasis. Electronically Signed   By: Marijo Conception M.D.   On: 01/06/2022 10:15   DG C-Arm 1-60 Min-No Report  Result Date: 01/06/2022 Fluoroscopy was utilized by the requesting physician.  No radiographic interpretation.   DG C-ARM BRONCHOSCOPY  Result Date: 01/06/2022 C-ARM BRONCHOSCOPY: Fluoroscopy was utilized by the requesting physician.  No radiographic interpretation.       Hosie Poisson M.D. [facility] 01/06/2022, 3:14 PM  Available via Epic secure chat 7am-7pm After 7 pm, please refer to night coverage provider listed on amion.

## 2022-01-06 NOTE — Anesthesia Postprocedure Evaluation (Signed)
Anesthesia Post Note  Patient: Isaly Watson [name]  Procedure(s) Performed: ROBOTIC ASSISTED NAVIGATIONAL BRONCHOSCOPY (Left) BRONCHIAL BRUSHINGS BRONCHIAL BIOPSIES BRONCHIAL NEEDLE ASPIRATION BIOPSIES BRONCHIAL WASHINGS FIDUCIAL MARKER PLACEMENT     Patient location during evaluation: PACU Anesthesia Type: General Level of consciousness: awake and alert Pain management: pain level controlled Vital Signs Assessment: post-procedure vital signs reviewed and stable Respiratory status: spontaneous breathing, nonlabored ventilation, respiratory function stable and patient connected to nasal cannula oxygen Cardiovascular status: blood pressure returned to baseline and stable Postop Assessment: no apparent nausea or vomiting Anesthetic complications: no   No notable events documented.  Last Vitals:  Vitals:   01/06/22 1008 01/06/22 1023  BP: 136/64 128/67  Pulse: 85 83  Resp: 17 14  Temp:  36.9 C  SpO2: 97% 95%    Last Pain:  Vitals:   01/06/22 1023  TempSrc:   PainSc: 0-No pain                 Azrael Huss S

## 2022-01-07 ENCOUNTER — Encounter: Payer: Self-pay | Admitting: *Deleted

## 2022-01-07 ENCOUNTER — Encounter (HOSPITAL_COMMUNITY): Payer: Self-pay | Admitting: Emergency Medicine

## 2022-01-07 ENCOUNTER — Other Ambulatory Visit (HOSPITAL_COMMUNITY): Payer: Self-pay

## 2022-01-07 DIAGNOSIS — E119 Type 2 diabetes mellitus without complications: Secondary | ICD-10-CM | POA: Diagnosis not present

## 2022-01-07 DIAGNOSIS — I10 Essential (primary) hypertension: Secondary | ICD-10-CM | POA: Diagnosis not present

## 2022-01-07 DIAGNOSIS — S72321A Displaced transverse fracture of shaft of right femur, initial encounter for closed fracture: Secondary | ICD-10-CM | POA: Diagnosis not present

## 2022-01-07 DIAGNOSIS — M84453A Pathological fracture, unspecified femur, initial encounter for fracture: Secondary | ICD-10-CM | POA: Diagnosis not present

## 2022-01-07 DIAGNOSIS — G4733 Obstructive sleep apnea (adult) (pediatric): Secondary | ICD-10-CM | POA: Diagnosis not present

## 2022-01-07 DIAGNOSIS — R918 Other nonspecific abnormal finding of lung field: Secondary | ICD-10-CM | POA: Diagnosis not present

## 2022-01-07 DIAGNOSIS — I700 Atherosclerosis of aorta: Secondary | ICD-10-CM | POA: Diagnosis not present

## 2022-01-07 LAB — GLUCOSE, CAPILLARY
Glucose-Capillary: 100 mg/dL — ABNORMAL HIGH (ref 70–99)
Glucose-Capillary: 107 mg/dL — ABNORMAL HIGH (ref 70–99)
Glucose-Capillary: 67 mg/dL — ABNORMAL LOW (ref 70–99)
Glucose-Capillary: 85 mg/dL (ref 70–99)

## 2022-01-07 MED ORDER — ACETAMINOPHEN 500 MG PO TABS
500.0000 mg | ORAL_TABLET | Freq: Three times a day (TID) | ORAL | 0 refills | Status: AC | PRN
  Filled 2022-01-07: qty 30, 10d supply, fill #0

## 2022-01-07 MED ORDER — HYDROCODONE-ACETAMINOPHEN 5-325 MG PO TABS
1.0000 | ORAL_TABLET | Freq: Three times a day (TID) | ORAL | 0 refills | Status: AC | PRN
  Filled 2022-01-07: qty 30, 5d supply, fill #0

## 2022-01-07 MED ORDER — APIXABAN 2.5 MG PO TABS
2.5000 mg | ORAL_TABLET | Freq: Two times a day (BID) | ORAL | 0 refills | Status: DC
  Filled 2022-01-07: qty 60, 30d supply, fill #0

## 2022-01-07 MED ORDER — METHOCARBAMOL 500 MG PO TABS
500.0000 mg | ORAL_TABLET | Freq: Four times a day (QID) | ORAL | 0 refills | Status: DC | PRN
  Filled 2022-01-07: qty 30, 8d supply, fill #0

## 2022-01-07 NOTE — Discharge Instructions (Signed)
[facility]uma Service Discharge Instructions   General Discharge Instructions  Orthopaedic Injuries:  Right femur fracture treated with intramedullary nailing  WEIGHT BEARING STATUS: Weight-bear as tolerated right leg, use crutches or walker  RANGE OF MOTION/ACTIVITY: Unrestricted range of motion right hip and knee.  Activity as tolerated.  Slowly increase activity level  Bone health: Vitamin D levels look good.  Would continue to take 5000 IUs vitamin D3 daily  Review the following resource for additional information regarding bone health  [email or web address]  Wound Care: Daily wound care as needed.  Okay to leave wounds open to the air.  Can keep them covered with a silicone foam dressing or 4 x 4 gauze and tape  Discharge Wound Care Instructions  Do NOT apply any ointments, solutions or lotions to pin sites or surgical wounds.  These prevent needed drainage and even though solutions like hydrogen peroxide kill bacteria, they also damage cells lining the pin sites that help fight infection.  Applying lotions or ointments can keep the wounds moist and can cause them to breakdown and open up as well. This can increase the risk for infection. When in doubt call the office.  Surgical incisions should be dressed daily.  If any drainage is noted, use one layer of adaptic or Mepitel, then gauze, and tape.  Alternatively you can use a silicone foam dressing which is what you currently have on.  These are called Mepilex  [email or web address].fr [email or web address].com.[email or web address]  CheapWipes.gl  These dressing supplies should be available at local medical supply stores ([facility], [facility], etc). They are not usually carried at places like [facility],  [facility], [facility], etc  Once the incision is completely dry and without drainage, it may be left open to air out.  Showering may begin 36-48 hours later.  Cleaning gently with soap and water.   DVT/PE prophylaxis: Eliquis 2.5 mg every 12 hours x 30 days  Diet: as you were eating previously.  Can use over the counter stool softeners and bowel preparations, such as Miralax, to help with bowel movements.  Narcotics can be constipating.  Be sure to drink plenty of fluids  PAIN MEDICATION USE AND EXPECTATIONS  You have likely been given narcotic medications to help control your pain.  After a traumatic event that results in an fracture (broken bone) with or without surgery, it is ok to use narcotic pain medications to help control one's pain.  We understand that everyone responds to pain differently and each individual patient will be evaluated on a regular basis for the continued need for narcotic medications. Ideally, narcotic medication use should last no more than 6-8 weeks (coinciding with fracture healing).   As a patient it is your responsibility as well to monitor narcotic medication use and report the amount and frequency you use these medications when you come to your office visit.   We would also advise that if you are using narcotic medications, you should take a dose prior to therapy to maximize you participation.  IF YOU ARE ON NARCOTIC MEDICATIONS IT IS NOT PERMISSIBLE TO OPERATE A MOTOR VEHICLE (MOTORCYCLE/CAR/TRUCK/MOPED) OR HEAVY MACHINERY DO NOT MIX NARCOTICS WITH OTHER CNS (CENTRAL NERVOUS SYSTEM) DEPRESSANTS SUCH AS ALCOHOL   POST-OPERATIVE OPIOID TAPER INSTRUCTIONS: It is important to wean off of your opioid medication as soon as possible. If you do not need pain medication after your surgery it is ok to stop day one. Opioids include: Codeine, Hydrocodone(Norco, Vicodin), Oxycodone(Percocet, oxycontin) and hydromorphone amongst others.  Long term and even  short term use of  opiods can cause: Increased pain response Dependence Constipation Depression Respiratory depression And more.  Withdrawal symptoms can include Flu like symptoms Nausea, vomiting And more Techniques to manage these symptoms Hydrate well Eat regular healthy meals Stay active Use relaxation techniques(deep breathing, meditating, yoga) Do Not substitute Alcohol to help with tapering If you have been on opioids for less than two weeks and do not have pain than it is ok to stop all together.  Plan to wean off of opioids This plan should start within one week post op of your fracture surgery  Maintain the same interval or time between taking each dose and first decrease the dose.  Cut the total daily intake of opioids by one tablet each day Next start to increase the time between doses. The last dose that should be eliminated is the evening dose.    STOP SMOKING OR USING NICOTINE PRODUCTS!!!!  As discussed nicotine severely impairs your body's ability to heal surgical and traumatic wounds but also impairs bone healing.  Wounds and bone heal by forming microscopic blood vessels (angiogenesis) and nicotine is a vasoconstrictor (essentially, shrinks blood vessels).  Therefore, if vasoconstriction occurs to these microscopic blood vessels they essentially disappear and are unable to deliver necessary nutrients to the healing tissue.  This is one modifiable factor that you can do to dramatically increase your chances of healing your injury.    (This means no smoking, no nicotine gum, patches, etc)  DO NOT USE NONSTEROIDAL ANTI-INFLAMMATORY DRUGS (NSAID'S)  Using products such as Advil (ibuprofen), Aleve (naproxen), Motrin (ibuprofen) for additional pain control during fracture healing can delay and/or prevent the healing response.  If you would like to take over the counter (OTC) medication, Tylenol (acetaminophen) is ok.  However, some narcotic medications that are given for pain control contain  acetaminophen as well. Therefore, you should not exceed more than 4000 mg of tylenol in a day if you do not have liver disease.  Also note that there are may OTC medicines, such as cold medicines and allergy medicines that my contain tylenol as well.  If you have any questions about medications and/or interactions please ask your doctor/PA or your pharmacist.      ICE AND ELEVATE INJURED/OPERATIVE EXTREMITY  Using ice and elevating the injured extremity above your heart can help with swelling and pain control.  Icing in a pulsatile fashion, such as 20 minutes on and 20 minutes off, can be followed.    Do not place ice directly on skin. Make sure there is a barrier between to skin and the ice pack.    Using frozen items such as frozen peas works well as the conform nicely to the are that needs to be iced.  USE AN ACE WRAP OR TED HOSE FOR SWELLING CONTROL  In addition to icing and elevation, Ace wraps or TED hose are used to help limit and resolve swelling.  It is recommended to use Ace wraps or TED hose until you are informed to stop.    When using Ace Wraps start the wrapping distally (farthest away from the body) and wrap proximally (closer to the body)   Example: If you had surgery on your leg or thing and you do not have a splint on, start the ace wrap at the toes and work your way up to the thigh        If you had surgery on your upper extremity and do not have a splint on, start  the ace wrap at your fingers and work your way up to the upper arm  IF YOU ARE IN A SPLINT OR CAST DO NOT REMOVE IT FOR ANY REASON   If your splint gets wet for any reason please contact the office immediately. You may shower in your splint or cast as long as you keep it dry.  This can be done by wrapping in a cast cover or garbage back (or similar)  Do Not stick any thing down your splint or cast such as pencils, money, or hangers to try and scratch yourself with.  If you feel itchy take benadryl as prescribed on the  bottle for itching  IF YOU ARE IN A CAM BOOT (BLACK BOOT)  You may remove boot periodically. Perform daily dressing changes as noted below.  Wash the liner of the boot regularly and wear a sock when wearing the boot. It is recommended that you sleep in the boot until told otherwise    Call office for the following: Temperature greater than 101F Persistent nausea and vomiting Severe uncontrolled pain Redness, tenderness, or signs of infection (pain, swelling, redness, odor or green/yellow discharge around the site) Difficulty breathing, headache or visual disturbances Hives Persistent dizziness or light-headedness Extreme fatigue Any other questions or concerns you may have after discharge  In an emergency, call 911 or go to an Emergency Department at a nearby hospital  HELPFUL INFORMATION  If you had a block, it will wear off between 8-24 hrs postop typically.  This is period when your pain may go from nearly zero to the pain you would have had postop without the block.  This is an abrupt transition but nothing dangerous is happening.  You may take an extra dose of narcotic when this happens.  You should wean off your narcotic medicines as soon as you are able.  Most patients will be off or using minimal narcotics before their first postop appointment.   We suggest you use the pain medication the first night prior to going to bed, in order to ease any pain when the anesthesia wears off. You should avoid taking pain medications on an empty stomach as it will make you nauseous.  Do not drink alcoholic beverages or take illicit drugs when taking pain medications.  In most states it is against the law to drive while you are in a splint or sling.  And certainly against the law to drive while taking narcotics.  You may return to work/school in the next couple of days when you feel up to it.   Pain medication may make you constipated.  Below are a few solutions to try in this  order: Decrease the amount of pain medication if you aren't having pain. Drink lots of decaffeinated fluids. Drink prune juice and/or each dried prunes  If the first 3 don't work start with additional solutions Take Colace - an over-the-counter stool softener Take Senokot - an over-the-counter laxative Take Miralax - a stronger over-the-counter laxative     CALL THE OFFICE WITH ANY QUESTIONS OR CONCERNS: [phone]   VISIT OUR WEBSITE FOR ADDITIONAL INFORMATION: [email or web address]

## 2022-01-07 NOTE — Care Management Important Message (Signed)
Important Message  Patient Details  Name: Laura Bush MRN: [id number] Date of Birth: 1951/08/06   Medicare Important Message Given:  Yes     Orbie Pyo 01/07/2022, 2:57 PM

## 2022-01-07 NOTE — Progress Notes (Signed)
Pt discharged to home by SWOT RN. Discharge instructions given by RN. See SWOT RN note for details.  Hale Bogus.

## 2022-01-07 NOTE — Progress Notes (Signed)
Patient is established with this office for management of hemochromatosis. She was recently admitted to the hospital after a fall. Workup revealed pathological fracture and widespread metastatic disease. She does have a history of breast. Work up still pending.   Reached out to Laura Bush to introduce myself as the office RN Navigator and explain our new patient process. Reviewed the reason for their referral and scheduled their new patient appointment along with labs. Provided address and directions to the office including call back phone number. Reviewed with patient any concerns they may have or any possible barriers to attending their appointment.   Informed patient about my role as a navigator and that I will meet with them prior to their New Patient appointment and more fully discuss what services I can provide. At this time patient has no further questions or needs.    Oncology Nurse Navigator Documentation     01/07/2022   11:00 AM  Oncology Nurse Navigator Flowsheets  Abnormal Finding Date 01/03/2022  Diagnosis Status Pathology Pending  Navigator Follow Up Date: 01/10/2022  Navigator Follow Up Reason: New Patient Appointment  Navigator Location [facility]  Navigator Encounter Type Introductory Phone Call  Patient Visit Type MedOnc  Treatment Phase Abnormal Scans  Barriers/Navigation Needs Coordination of Care;Education  Education Other  Interventions Coordination of Care;Education  Acuity Level 2-Minimal Needs (1-2 Barriers Identified)  Coordination of Care Appts  Education Method Verbal  Time Spent with Patient 45

## 2022-01-07 NOTE — Progress Notes (Signed)
Orthopaedic Trauma Service Progress Note  Patient ID: Laura Bush MRN: [id number] DOB/AGE: 02-08-1951 71 y.o.  Subjective:  Pain controlled R leg Wants to dc today  No other complaints Very pleasant  Objective:   VITALS:   Vitals:   01/06/22 1358 01/06/22 2046 01/07/22 0416 01/07/22 0813  BP: (!) 124/57 105/64 (!) 124/53 123/67  Pulse: 85 82 83 85  Resp: 18 15 18 14   Temp: 97.9 F (36.6 C) 98.4 F (36.9 C) 98.4 F (36.9 C) 98.2 F (36.8 C)  TempSrc: Oral Oral Oral Oral  SpO2: 98% 98% 97% 98%  Weight:      Height:        Estimated body mass index is 33.44 kg/m as calculated from the following:   Height as of this encounter: 4\' 10"  (1.473 m).   Weight as of this encounter: 72.6 kg.   Intake/Output      06/01 0701 06/02 0700 06/02 0701 06/03 0700   P.O. 720 240   I.V. (mL/kg) 600 (8.3)    Total Intake(mL/kg) 1320 (18.2) 240 (3.3)   Urine (mL/kg/hr) 0 (0)    Blood 10    Total Output 10    Net +1310 +240        Urine Occurrence 4 x      LABS  Results for orders placed or performed during the hospital encounter of 01/03/22 (from the past 24 hour(s))  Glucose, capillary     Status: Abnormal   Collection Time: 01/06/22 11:30 AM  Result Value Ref Range   Glucose-Capillary 113 (H) 70 - 99 mg/dL  Glucose, capillary     Status: Abnormal   Collection Time: 01/06/22  4:46 PM  Result Value Ref Range   Glucose-Capillary 165 (H) 70 - 99 mg/dL  Glucose, capillary     Status: Abnormal   Collection Time: 01/06/22  8:48 PM  Result Value Ref Range   Glucose-Capillary 122 (H) 70 - 99 mg/dL  Glucose, capillary     Status: Abnormal   Collection Time: 01/07/22  4:13 AM  Result Value Ref Range   Glucose-Capillary 100 (H) 70 - 99 mg/dL  Glucose, capillary     Status: Abnormal   Collection Time: 01/07/22  8:17 AM  Result Value Ref Range   Glucose-Capillary 107 (H) 70 - 99 mg/dL      PHYSICAL EXAM:   Gen: sitting up in chair, NAD, appears well.  Husband at bedside Lungs: Unlabored Cardiac: reg Ext:       Right lower extremity             Dressings are clean, dry and intact  Dressing changed   All wounds look great   No active drainage    No erythema              Extremity is warm             Mild tenderness over right thigh             Excellent knee and ankle range of motion             DPN, SPN, TN sensory function intact             EHL, FHL, lesser toe motor functions intact  Ankle flexion, extension, inversion and eversion intact             No DCT             Compartments are soft and nontender, no pain out of proportion with passive stretching     Assessment/Plan:  Principal Problem:   Mass of lower lobe of left lung Active Problems:   Essential hypertension   Hypercholesteremia   GERD (gastroesophageal reflux disease)   Malignant 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, estrogen receptor positive ([facility])   Pathologic fracture of femur (HCC)   DM (diabetes mellitus), type 2 (HCC)   OSA on CPAP   Laceration of left eyebrow   Aortic atherosclerosis (HCC)   CAD (coronary artery disease)   Anti-infectives (From admission, onward)    Start     Dose/Rate Route Frequency Ordered Stop   01/04/22 1800  ceFAZolin (ANCEF) IVPB 2g/100 mL premix        2 g 200 mL/hr over 30 Minutes Intravenous Every 6 hours 01/04/22 1215 01/05/22 0046   01/04/22 1230  ceFAZolin (ANCEF) IVPB 2g/100 mL premix  Status:  Discontinued        2 g 200 mL/hr over 30 Minutes Intravenous On call to O.R. 01/04/22 1215 01/04/22 1256   01/04/22 0744  ceFAZolin (ANCEF) 2-4 GM/100ML-% IVPB       Note to Pharmacy: Ladoris Gene A: cabinet override      01/04/22 0744 01/04/22 1959     .  POD/HD#: 28  71 year old female history of breast cancer with low energy right femoral shaft fracture, suspicious for pathologic fracture   -Ground-level fall  with antecedent right thigh pain   -Likely pathologic fracture right femur s/p retrograde intramedullary nailing             Weight-bear as tolerated right leg             Range of motion as tolerated right hip and knee             PT and OT              Dressing changes as needed starting today   Okay to shower and clean wounds with soap and water only   Okay to leave open to the air              Ice and elevate as needed for swelling and pain control               No other restrictions noted             Okay to travel to the [employer] from our standpoint               TED hose right leg               Follow-up on intraoperative pathology   - Pain management:             Multimodal             Patient has had excellent pain control.  No narcotics since surgery  Will write for short course of narcotics if she has some available   - Medical issues              Per primary team              - DVT/PE prophylaxis:             Lovenox for now  Recommend Eliquis 2.5 mg twice daily for 30 days at discharge - ID:              Perioperative antibiotics - Metabolic Bone Disease:             Vitamin D levels look ok              Patient had normal DEXA scan feb 2022.  Thus this favor more of pathologic fracture due to tumor    - Activity:             As above   - Impediments to fracture healing:             Fracture through tumor    - Dispo:             Ortho issues stable             Continue with therapies             Follow-up with orthopedics in 2 to 3 weeks                         Okay for her to follow-up when she returns from the [employer]  Medication sent to [place], PA-C [phone] (C) 01/07/2022, 9:29 AM  [facility] [place] [address] [phone] Jenetta [phone] (F)    After 5pm and on the weekends please log on to Amion, go to orthopaedics and the look under the [facility] Call for the provider(s) on call. You can also call our office at [phone] and then follow the prompts to be connected to the call team.   Patient ID: Laura Bush, female   DOB: [name] 04, 1951, 71 y.o.   MRN: [id number]

## 2022-01-08 LAB — CULTURE, BAL-QUANTITATIVE W GRAM STAIN
Culture: NO GROWTH
Gram Stain: NONE SEEN

## 2022-01-08 LAB — ACID FAST SMEAR (AFB, MYCOBACTERIA): Acid Fast Smear: NEGATIVE

## 2022-01-10 ENCOUNTER — Inpatient Hospital Stay: Payer: Medicare HMO

## 2022-01-10 ENCOUNTER — Ambulatory Visit (INDEPENDENT_AMBULATORY_CARE_PROVIDER_SITE_OTHER): Payer: Medicare HMO | Admitting: Bariatrics

## 2022-01-10 ENCOUNTER — Encounter: Payer: Self-pay | Admitting: *Deleted

## 2022-01-10 ENCOUNTER — Inpatient Hospital Stay: Payer: Medicare HMO | Admitting: Hematology & Oncology

## 2022-01-10 DIAGNOSIS — C50812 Malignant neoplasm of overlapping sites of left female breast: Secondary | ICD-10-CM

## 2022-01-10 DIAGNOSIS — E785 Hyperlipidemia, unspecified: Secondary | ICD-10-CM | POA: Diagnosis not present

## 2022-01-10 DIAGNOSIS — E119 Type 2 diabetes mellitus without complications: Secondary | ICD-10-CM | POA: Diagnosis not present

## 2022-01-10 DIAGNOSIS — R918 Other nonspecific abnormal finding of lung field: Secondary | ICD-10-CM

## 2022-01-10 DIAGNOSIS — S72351D Displaced comminuted fracture of shaft of right femur, subsequent encounter for closed fracture with routine healing: Secondary | ICD-10-CM | POA: Diagnosis not present

## 2022-01-10 DIAGNOSIS — I5032 Chronic diastolic (congestive) heart failure: Secondary | ICD-10-CM | POA: Diagnosis not present

## 2022-01-10 DIAGNOSIS — Z7901 Long term (current) use of anticoagulants: Secondary | ICD-10-CM | POA: Diagnosis not present

## 2022-01-10 DIAGNOSIS — Z7982 Long term (current) use of aspirin: Secondary | ICD-10-CM | POA: Diagnosis not present

## 2022-01-10 DIAGNOSIS — Z853 Personal history of malignant neoplasm of breast: Secondary | ICD-10-CM | POA: Diagnosis not present

## 2022-01-10 DIAGNOSIS — Z8673 Personal history of transient ischemic attack (TIA), and cerebral infarction without residual deficits: Secondary | ICD-10-CM | POA: Diagnosis not present

## 2022-01-10 DIAGNOSIS — K219 Gastro-esophageal reflux disease without esophagitis: Secondary | ICD-10-CM | POA: Diagnosis not present

## 2022-01-10 DIAGNOSIS — W19XXXD Unspecified fall, subsequent encounter: Secondary | ICD-10-CM | POA: Diagnosis not present

## 2022-01-10 DIAGNOSIS — G4733 Obstructive sleep apnea (adult) (pediatric): Secondary | ICD-10-CM | POA: Diagnosis not present

## 2022-01-10 DIAGNOSIS — Z7902 Long term (current) use of antithrombotics/antiplatelets: Secondary | ICD-10-CM | POA: Diagnosis not present

## 2022-01-10 DIAGNOSIS — I110 Hypertensive heart disease with heart failure: Secondary | ICD-10-CM | POA: Diagnosis not present

## 2022-01-10 DIAGNOSIS — Z9989 Dependence on other enabling machines and devices: Secondary | ICD-10-CM | POA: Diagnosis not present

## 2022-01-10 DIAGNOSIS — Z7985 Long-term (current) use of injectable non-insulin antidiabetic drugs: Secondary | ICD-10-CM | POA: Diagnosis not present

## 2022-01-10 NOTE — Discharge Summary (Signed)
Physician Discharge Summary   Patient: Laura Bush MRN: [id number] DOB: March 10, 1951  Admit date:     01/03/2022  Discharge date: 01/07/2022  Discharge Physician: Hosie Poisson   PCP: Darreld Mclean, MD   Recommendations at discharge:  Please follow up with PCP in one week.  Please follow up with the results of the biopsy with Dr Melida Gimenez.  Please follow up with Dr Lamonte Sakai as needed.  We have stopped hctz ad telmisartan.   Discharge Diagnoses: Principal Problem:   Mass of lower lobe of left lung Active Problems:   Pathologic fracture of femur (HCC)   DM (diabetes mellitus), type 2 (HCC)   Essential hypertension   Hypercholesteremia   GERD (gastroesophageal reflux disease)   Laceration of left eyebrow   Malignant 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, estrogen receptor positive ([place])   OSA on CPAP   Aortic atherosclerosis (HCC)   CAD (coronary artery disease)  Resolved Problems:   Hypokalemia  Hospital Course: 71yow PMH left breast cancer status post lumpectomy, XRT 2016, right breast ductal carcinoma in situ; released from oncology. Care 2021, was admitted 5/29 when she slipped in the bathroom and fell resulting in leg pain. Imaging revealed pathologic right femur fx. S/p surgery 5/30 w/ bone biopsy pending. Underwent biopsy of the  LLL via bronchoscopy later this weak. Wants to follow-up with [facility].  Assessment and Plan:   Pathologic fracture of femur ([name]) --s/p low energy impact in patient w/ history of breast cancer; X-rays revealed lytic lesion lateral femoral cortex. CT C/A/P showed lung mass. CT R femur showed pathologic fracture. --s/p surgery 5/30, bone biopsy pending --management per orthopedics - pain is adequately controlled.  - therapy evaluations on board recommending supervision and assistance.   - please follow up with Dr Melida Gimenez for the biopsy results.    Mass of lower lobe of left lung --Irregular mass,  with nodule inferior to it. Gross appearance is more suggestive of a bronchogenic neoplasm with intralobe synchronous primary or metastasis, but both could be metastatic from an extrathoracic primary in this patient with history of left breast cancer.  --PET-CT recommended --underwent navigational biopsy of the left lower lobe of the pulmonary nodule.  --will need outpatient follow-up with Dr Marin Olp / Judson Roch for the biopsy results.    DM (diabetes mellitus), type 2 ([place]) adequately controlled CBG'S.  NON insulin dependent.   Laceration of left eyebrow --s/p Prolene sutures x6 in ED 5/29 --remove sutures in 5-7 days 6/3-6/5   GERD (gastroesophageal reflux disease) Stable, continue with Protonix.    Hypercholesteremia On statin, continue the same.    Essential hypertension BP parameters are adequately controlled.  Recommend restarting Nifedipine and hold amiloride  if BP is less than SBP<100 MMHG.  Meanwhile stop micardis and hydrochlorothiazide.    CAD (coronary artery disease) --seen on chest CT; follow-up as an outpatient   Constipation:  Start on senna and colace and miralax.    Estimated body mass index is 33.44 kg/m as calculated from the following:   Height as of this encounter: _0  (1.473 m).   Weight as of this encounter: 72.6 kg.  Consultants: [facility].  Procedures performed: BRONCHOSCOPY AND BIOPSY OF THE LLL NODULE.   Disposition: Home Diet recommendation:  Discharge Diet Orders (From admission, onward)     Start     Ordered   01/07/22 0000  Diet - low sodium heart healthy  01/07/22 1219           Regular diet DISCHARGE MEDICATION: Allergies as of 01/07/2022       Reactions   Diclofenac Hypertension   Amlodipine Swelling, Other (See Comments)   Limbs swell, not the throat   Lanolin Other (See Comments)   Sneezing and watery eyes. Allergic to [facility]   Tape Other (See Comments)   Redness ([place]anda[place] also)         Medication List     STOP taking these medications    hydrochlorothiazide 25 MG tablet Commonly known as: HYDRODIURIL   naproxen 500 MG tablet Commonly known as: NAPROSYN   telmisartan 80 MG tablet Commonly known as: MICARDIS       TAKE these medications    Acetaminophen Extra Strength 500 MG tablet Generic drug: acetaminophen Take 1 tablet (500 mg total) by mouth every 8 (eight) hours as needed for mild pain or moderate pain.   aMILoride 5 MG tablet Commonly known as: MIDAMOR Take 1 tablet (5 mg total) by mouth daily. What changed: when to take this   aspirin EC 81 MG tablet Take 81 mg by mouth daily. Swallow whole.   CALCIUM + D3 PO Take 1 tablet by mouth daily.   Eliquis 2.5 MG Tabs tablet Generic drug: apixaban Take 1 tablet (2.5 mg total) by mouth 2 (two) times daily.   fexofenadine 180 MG tablet Commonly known as: ALLEGRA Take 180 mg by mouth in the morning.   fluticasone 0.05 % cream Commonly known as: CUTIVATE Apply 1 application. topically See admin instructions. Apply to dry area of the left thigh once a day   HYDROcodone-acetaminophen 5-325 MG tablet Commonly known as: NORCO/VICODIN Take 1-2 tablets by mouth every 8 (eight) hours as needed for moderate pain or severe pain.   ipratropium 0.06 % nasal spray Commonly known as: ATROVENT Place 2 sprays into both nostrils 4 (four) times daily. Use as needed What changed:  when to take this reasons to take this additional instructions   methocarbamol 500 MG tablet Commonly known as: ROBAXIN Take 1 tablet (500 mg total) by mouth every 6 (six) hours as needed for muscle spasms.   NIFEdipine 30 MG 24 hr tablet Commonly known as: PROCARDIA-XL/NIFEDICAL-XL Take 1 tablet (30 mg total) by mouth daily.   Ozempic (2 MG/DOSE) 8 MG/3ML Sopn Generic drug: Semaglutide (2 MG/DOSE) Inject 2 mg into the skin once a week. What changed: when to take this   pantoprazole 40 MG tablet Commonly known as:  Protonix Take 1 tablet (40 mg total) by mouth daily. What changed: when to take this   simvastatin 20 MG tablet Commonly known as: ZOCOR Take 1 tablet (20 mg total) by mouth daily. What changed: when to take this   SUPER B COMPLEX PO Take 1 tablet by mouth daily with breakfast.        Follow-up Information     Altamese Reedsport, MD. Schedule an appointment as soon as possible for a visit in 3 week(s).   Specialty: Orthopedic Surgery Contact information: [place] [address] [phone]                Discharge Exam: Filed Weights   01/03/22 1809 01/04/22 0754 01/06/22 0716  Weight: 74.8 kg 74.8 kg 72.6 kg   General exam: Appears calm and comfortable  Respiratory system: Clear to auscultation. Respiratory effort normal. Cardiovascular system: S1 & S2 heard, RRR. No JVD, murmurs, rubs, gallops or clicks. No pedal edema. Gastrointestinal  system: Abdomen is nondistended, soft and nontender. No organomegaly or masses felt. Normal bowel sounds heard. Central nervous system: Alert and oriented. No focal neurological deficits. Extremities: Symmetric 5 x 5 power. Skin: No rashes, lesions or ulcers Psychiatry: Judgement and insight appear normal. Mood & affect appropriate.    Condition at discharge: fair  The results of significant diagnostics from this hospitalization (including imaging, microbiology, ancillary and laboratory) are listed below for reference.   Imaging Studies: DG Knee 1-2 Views Left  Result Date: 12/29/2021 CLINICAL DATA:  Posterior proximal right knee pain for 2 and half weeks after traveling. Left knee films for comparison EXAM: LEFT KNEE - 1-2 VIEW COMPARISON:  None Available. FINDINGS: Degenerative changes in the medial and lateral compartments of the left knee. Mild joint space loss medially. No fractures or dislocations. No other left knee abnormalities identified IMPRESSION: Degenerative changes in the mediolateral compartments of  the left knee. Mild joint space loss medially. No other abnormalities on the left Electronically Signed   By: Dorise Bullion III M.D.   On: 12/29/2021 18:29   CT Head Wo Contrast  Result Date: 01/03/2022 CLINICAL DATA:  Lower extremity injury. Fell. Also musculoskeletal neoplasm for staging. There is a 2016 history of partial mastectomy for carcinoma left breast EXAM: CT HEAD WITHOUT CONTRAST CT CHEST, ABDOMEN AND PELVIS WITH CONTRAST TECHNIQUE: Contiguous axial images were obtained from the base of the skull through the vertex without intravenous contrast. Multidetector CT imaging of the chest, abdomen and pelvis was performed during intravenous contrast administration. RADIATION DOSE REDUCTION: This exam was performed according to the departmental dose-optimization program which includes automated exposure control, adjustment of the mA and/or kV according to patient size and/or use of iterative reconstruction technique. CONTRAST:  127m OMNIPAQUE IOHEXOL 300 MG/ML  SOLN COMPARISON:  No prior body CT for comparison. Only relevant previous study is portable chest x-ray earlier today. FINDINGS: CT HEAD FINDINGS Brain: No evidence of acute infarction, hemorrhage, hydrocephalus, extra-axial collection or mass lesion/mass effect. There is mild cerebrocerebellar atrophy. Vascular: There are scattered calcifications of the carotid siphons but no hyperdense central vasculature. Skull: No fracture or focal bone lesion is seen. Sinuses/Orbits: No acute findings. No mastoid or middle ear effusion. There is membrane thickening in the posterior ethmoid air cells, right sphenoid air cell without fluid levels. Other: None. CT CHEST FINDINGS Cardiovascular: Cardiac size is normal. There are calcifications in the proximal LAD coronary artery. There is no pericardial effusion. There is mild aortic atherosclerosis without aneurysm or dissection. There are mild calcifications and thickening in the aortic valve but no aortic root  dilatation. Pulmonary arteries are normal caliber and centrally clear, pulmonary veins are decompressed. Mediastinum/Nodes: The lower poles of the thyroid gland are unremarkable. Axillary spaces are clear. No intrathoracic adenopathy is seen. There is a small hiatal hernia. Thoracic esophagus unremarkable aside from small amounts of scattered retained fluid. There is no tracheal filling defect. Lungs/Pleura: There is a lobular irregular 1.7 x 1.2 cm mass in the left lower lobe posteriorly in the superior segment. There is pleural stranding posterior to this with small surrounding spiculations. There is a second, ovoid to rounded nodule inferior to this in the posterior basal segment of the left lower lobe measuring 1.0 x 0.8 cm. Findings are worrisome for primary bronchogenic neoplasm with a small intraorgan synchronous primary or intraorgan metastatic lesion versus 2 separate metastases. There is subpleural reticulation in the anterior aspect of the left upper lobe which is probably related to left breast XRT.  The remainder of both lungs are clear. There is no pleural effusion, thickening or pneumothorax. Musculoskeletal: No chest wall mass or suspicious bone lesions identified. There is thoracic spondylosis. There are postsurgical changes in the upper half of the left breast, small foci of fat necrosis and oil cysts anteriorly. CT ABDOMEN AND PELVIS FINDINGS Hepatobiliary: There is a 4 mm too small to characterize hypodensity in the dome of the left lobe of the liver. There is a 1.3 cm cyst of 11.9 Hounsfield units in the left lobe medial segment. The remaining liver is unremarkable. There are 2 small stones in the proximal gallbladder but no wall thickening or biliary dilatation. Pancreas: No mass enhancement or ductal dilatation is seen. Spleen: No mass enhancement or splenomegaly. Adrenals/Urinary Tract: No adrenal mass or renal cortical mass enhancement. There is no urinary stone or obstruction. There is no  bladder thickening. Stomach/Bowel: There is mild gastric fluid distention without wall thickening. The unopacified small bowel is unremarkable. An appendix is not seen in this patient. There is moderate stool retention ascending and proximal transverse colon. Left colonic diverticula without evidence of colitis or diverticulitis. Vascular/Lymphatic: There are mild calcific plaques in the infrarenal aorta, common iliac arteries. No aneurysm or stenosis is seen. No adenopathy seen in the abdomen or the pelvis. Reproductive: No mass or other significant abnormality. Status post hysterectomy. Musculoskeletal: There is prominent facet hypertrophy in the lower lumbar spine most significant at L4-5 and L5-S1, with spurring and osteophytes of the pubic symphysis. No destructive bone lesion is seen. Other: There is no incarcerated hernia. There is no free air, hemorrhage or fluid. IMPRESSION: 1. Irregular 1.7 x 1.2 cm left lower lobe mass, with 1.0 x 0.8 cm nodule inferior to it. The gross appearance is more suggestive of a bronchogenic neoplasm with intralobe synchronous primary or metastasis, but both could be metastatic from an extrathoracic primary in this patient with history of left breast cancer. PET-CT is recommended and tissue sampling will almost certainly be needed. 2. Coronary artery and aortic atherosclerosis. 3. Small hiatal hernia, scattered retained fluid in the otherwise unremarkable esophagus. 4. Subpleural reticulation anteriorly in the left upper lobe most likely due to left breast XRT with evidence of partial left mastectomy. 5. 4 mm too small to characterize hypodensity in the hepatic dome and a small cyst in the left lobe medial segment. 6. No adenopathy or mass enhancement in the abdomen or pelvis. 7. Constipation and diverticulosis. 8. Degenerative changes of the spine without destructive bone lesions. 9. No acute intracranial CT findings or appreciable mass. Mild global atrophy. Electronically  Signed   By: Telford Nab M.D.   On: 01/03/2022 23:42   CT FEMUR RIGHT W CONTRAST  Result Date: 01/03/2022 CLINICAL DATA:  Metastatic disease, evaluate for pathologic fracture. Fall. History of breast cancer. EXAM: CT OF THE LOWER RIGHT EXTREMITY WITH CONTRAST TECHNIQUE: Multidetector CT imaging of the lower right extremity was performed according to the standard protocol following intravenous contrast administration. RADIATION DOSE REDUCTION: This exam was performed according to the departmental dose-optimization program which includes automated exposure control, adjustment of the mA and/or kV according to patient size and/or use of iterative reconstruction technique. CONTRAST:  151m OMNIPAQUE IOHEXOL 300 MG/ML  SOLN COMPARISON:  Right femur x-ray 01/03/2022 FINDINGS: Bones/Joint/Cartilage There is an acute oblique fracture through the mid femoral diaphysis. There is 1 shaft with posterior displacement of the distal fracture fragment with 3 cm of overlap. At the level of the fracture the lateral cortex of the  femur demonstrates heterogeneous lytic areas with surrounding soft tissue calcifications. Ligaments Suboptimally assessed by CT. Muscles and Tendons Grossly within normal limits. Soft tissues No focal hematoma or fluid collection. IMPRESSION: 1. Acute displaced fracture of the mid femur with lytic bone lesion/soft tissue calcifications at this level. Findings are compatible with pathologic fracture. Electronically Signed   By: Ronney Asters M.D.   On: 01/03/2022 23:33   CT CHEST ABDOMEN PELVIS W CONTRAST  Result Date: 01/03/2022 CLINICAL DATA:  Lower extremity injury. Fell. Also musculoskeletal neoplasm for staging. There is a 2016 history of partial mastectomy for carcinoma left breast EXAM: CT HEAD WITHOUT CONTRAST CT CHEST, ABDOMEN AND PELVIS WITH CONTRAST TECHNIQUE: Contiguous axial images were obtained from the base of the skull through the vertex without intravenous contrast. Multidetector CT  imaging of the chest, abdomen and pelvis was performed during intravenous contrast administration. RADIATION DOSE REDUCTION: This exam was performed according to the departmental dose-optimization program which includes automated exposure control, adjustment of the mA and/or kV according to patient size and/or use of iterative reconstruction technique. CONTRAST:  156m OMNIPAQUE IOHEXOL 300 MG/ML  SOLN COMPARISON:  No prior body CT for comparison. Only relevant previous study is portable chest x-ray earlier today. FINDINGS: CT HEAD FINDINGS Brain: No evidence of acute infarction, hemorrhage, hydrocephalus, extra-axial collection or mass lesion/mass effect. There is mild cerebrocerebellar atrophy. Vascular: There are scattered calcifications of the carotid siphons but no hyperdense central vasculature. Skull: No fracture or focal bone lesion is seen. Sinuses/Orbits: No acute findings. No mastoid or middle ear effusion. There is membrane thickening in the posterior ethmoid air cells, right sphenoid air cell without fluid levels. Other: None. CT CHEST FINDINGS Cardiovascular: Cardiac size is normal. There are calcifications in the proximal LAD coronary artery. There is no pericardial effusion. There is mild aortic atherosclerosis without aneurysm or dissection. There are mild calcifications and thickening in the aortic valve but no aortic root dilatation. Pulmonary arteries are normal caliber and centrally clear, pulmonary veins are decompressed. Mediastinum/Nodes: The lower poles of the thyroid gland are unremarkable. Axillary spaces are clear. No intrathoracic adenopathy is seen. There is a small hiatal hernia. Thoracic esophagus unremarkable aside from small amounts of scattered retained fluid. There is no tracheal filling defect. Lungs/Pleura: There is a lobular irregular 1.7 x 1.2 cm mass in the left lower lobe posteriorly in the superior segment. There is pleural stranding posterior to this with small surrounding  spiculations. There is a second, ovoid to rounded nodule inferior to this in the posterior basal segment of the left lower lobe measuring 1.0 x 0.8 cm. Findings are worrisome for primary bronchogenic neoplasm with a small intraorgan synchronous primary or intraorgan metastatic lesion versus 2 separate metastases. There is subpleural reticulation in the anterior aspect of the left upper lobe which is probably related to left breast XRT. The remainder of both lungs are clear. There is no pleural effusion, thickening or pneumothorax. Musculoskeletal: No chest wall mass or suspicious bone lesions identified. There is thoracic spondylosis. There are postsurgical changes in the upper half of the left breast, small foci of fat necrosis and oil cysts anteriorly. CT ABDOMEN AND PELVIS FINDINGS Hepatobiliary: There is a 4 mm too small to characterize hypodensity in the dome of the left lobe of the liver. There is a 1.3 cm cyst of 11.9 Hounsfield units in the left lobe medial segment. The remaining liver is unremarkable. There are 2 small stones in the proximal gallbladder but no wall thickening or biliary dilatation.  Pancreas: No mass enhancement or ductal dilatation is seen. Spleen: No mass enhancement or splenomegaly. Adrenals/Urinary Tract: No adrenal mass or renal cortical mass enhancement. There is no urinary stone or obstruction. There is no bladder thickening. Stomach/Bowel: There is mild gastric fluid distention without wall thickening. The unopacified small bowel is unremarkable. An appendix is not seen in this patient. There is moderate stool retention ascending and proximal transverse colon. Left colonic diverticula without evidence of colitis or diverticulitis. Vascular/Lymphatic: There are mild calcific plaques in the infrarenal aorta, common iliac arteries. No aneurysm or stenosis is seen. No adenopathy seen in the abdomen or the pelvis. Reproductive: No mass or other significant abnormality. Status post  hysterectomy. Musculoskeletal: There is prominent facet hypertrophy in the lower lumbar spine most significant at L4-5 and L5-S1, with spurring and osteophytes of the pubic symphysis. No destructive bone lesion is seen. Other: There is no incarcerated hernia. There is no free air, hemorrhage or fluid. IMPRESSION: 1. Irregular 1.7 x 1.2 cm left lower lobe mass, with 1.0 x 0.8 cm nodule inferior to it. The gross appearance is more suggestive of a bronchogenic neoplasm with intralobe synchronous primary or metastasis, but both could be metastatic from an extrathoracic primary in this patient with history of left breast cancer. PET-CT is recommended and tissue sampling will almost certainly be needed. 2. Coronary artery and aortic atherosclerosis. 3. Small hiatal hernia, scattered retained fluid in the otherwise unremarkable esophagus. 4. Subpleural reticulation anteriorly in the left upper lobe most likely due to left breast XRT with evidence of partial left mastectomy. 5. 4 mm too small to characterize hypodensity in the hepatic dome and a small cyst in the left lobe medial segment. 6. No adenopathy or mass enhancement in the abdomen or pelvis. 7. Constipation and diverticulosis. 8. Degenerative changes of the spine without destructive bone lesions. 9. No acute intracranial CT findings or appreciable mass. Mild global atrophy. Electronically Signed   By: Telford Nab M.D.   On: 01/03/2022 23:42   US Venous Img Lower Unilateral Right (DVT)  Result Date: 12/14/2021 CLINICAL DATA:  Pain and swelling EXAM: Right LOWER EXTREMITY VENOUS DOPPLER ULTRASOUND TECHNIQUE: Gray-scale sonography with compression, as well as color and duplex ultrasound, were performed to evaluate the deep venous system(s) from the level of the common femoral vein through the popliteal and proximal calf veins. COMPARISON:  None Available. FINDINGS: VENOUS Normal compressibility of the common femoral, superficial femoral, and popliteal veins, as  well as the visualized calf veins. Visualized portions of profunda femoral vein and great saphenous vein unremarkable. No filling defects to suggest DVT on grayscale or color Doppler imaging. Doppler waveforms show normal direction of venous flow, normal respiratory plasticity and response to augmentation. Limited views of the contralateral common femoral vein are unremarkable. OTHER None. Limitations: none IMPRESSION: There is no evidence of deep venous thrombosis in the right lower extremity. Electronically Signed   By: Elmer Picker M.D.   On: 12/14/2021 17:18   DG Chest Port 1 View  Result Date: 01/06/2022 CLINICAL DATA:  Status post bronchoscopy. EXAM: PORTABLE CHEST 1 VIEW COMPARISON:  [name] 03, 2022. FINDINGS: Stable cardiomediastinal silhouette. Minimal right midlung subsegmental atelectasis is noted. Left lung is clear. No pneumothorax is noted. The visualized skeletal structures are unremarkable. IMPRESSION: Minimal right midlung subsegmental atelectasis. Electronically Signed   By: Marijo Conception M.D.   On: 01/06/2022 10:15   DG Chest Port 1 View  Result Date: 01/03/2022 CLINICAL DATA:  [place], right-sided pain EXAM: PORTABLE  CHEST 1 VIEW COMPARISON:  None Available. FINDINGS: Single frontal view of the chest demonstrates an unremarkable cardiac silhouette. No acute airspace disease, effusion, or pneumothorax. No acute or destructive bony lesions. IMPRESSION: 1. No acute intrathoracic process. Electronically Signed   By: Randa Ngo M.D.   On: 01/03/2022 18:23   DG Knee Complete 4 Views Right  Result Date: 12/29/2021 CLINICAL DATA:  Knee pain. EXAM: RIGHT KNEE - COMPLETE 4+ VIEW COMPARISON:  None Available. FINDINGS: Tricompartmental degenerative changes in the right knee. Loss of joint space medially. No fractures or dislocations. The patella is high riding on the lateral view but the lateral view was performed in extension. No joint effusion. IMPRESSION: 1. The high riding patella on  the lateral view is likely due to the fact that the knee was imaged in extension. Recommend clinical correlation. 2. Tricompartmental degenerative changes. Electronically Signed   By: Dorise Bullion III M.D.   On: 12/29/2021 18:31   DG C-Arm 1-60 Min-No Report  Result Date: 01/06/2022 Fluoroscopy was utilized by the requesting physician.  No radiographic interpretation.   DG C-Arm 1-60 Min-No Report  Result Date: 01/04/2022 Fluoroscopy was utilized by the requesting physician.  No radiographic interpretation.   DG C-Arm 1-60 Min-No Report  Result Date: 01/04/2022 Fluoroscopy was utilized by the requesting physician.  No radiographic interpretation.   DG FEMUR, MIN 2 VIEWS RIGHT  Result Date: 01/04/2022 CLINICAL DATA:  Right femoral ORIF EXAM: RIGHT FEMUR 2 VIEWS COMPARISON:  X-ray 01/03/2022 FINDINGS: Seven C-arm fluoroscopic images were obtained intraoperatively and submitted for post operative interpretation. Images demonstrate long intramedullary rod with proximal and distal interlocking screw fixation traversing mid right femoral diaphyseal fracture. 1 minute 26 seconds fluoroscopy time utilized. Radiation dose: 12.8 mGy. Please see the performing provider's procedural report for further detail. IMPRESSION: Intraoperative right femur ORIF. Electronically Signed   By: Davina Poke D.O.   On: 01/04/2022 11:09   DG Femur Min 2 Views Right  Result Date: 01/03/2022 CLINICAL DATA:  [place], pain EXAM: RIGHT FEMUR 2 VIEWS COMPARISON:  12/29/2021 FINDINGS: Frontal and lateral views of the right femur are obtained. There is an oblique mid femoral diaphyseal fracture with valgus angulation at the fracture site. One shaft width posterior displacement of the distal fracture fragment. There is a permeative moth-eaten appearance of the lateral cortex of the mid femoral diaphysis at the fracture site, suggesting underlying pathologic fracture. The remainder of the right femur is unremarkable. Stable  osteoarthritis within the right knee. Right hip is unremarkable. Diffuse soft tissue swelling. IMPRESSION: 1. Displaced fracture mid right femoral diaphysis as above. There is a permeative moth-eaten appearance of the lateral cortex along the fracture margin, suggesting pathologic fracture in this patient with a known history of breast cancer. Electronically Signed   By: Randa Ngo M.D.   On: 01/03/2022 18:22   DG FEMUR PORT, MIN 2 VIEWS RIGHT  Result Date: 01/04/2022 CLINICAL DATA:  Right femur ORIF EXAM: RIGHT FEMUR PORTABLE 2 VIEW COMPARISON:  01/03/2022 FINDINGS: Status post ORIF of mid left femoral diaphyseal fracture. Lytic changes with periosteal reaction along the lateral cortex at the fracture site suggestive of a pathologic fracture. Fracture alignment is anatomic. No additional fractures. No additional bone lesion is seen. IMPRESSION: Status post ORIF of mid left femoral diaphyseal fracture, in anatomic alignment. Lytic changes with periosteal reaction along the lateral cortex at the fracture site suggestive of a pathologic fracture. Electronically Signed   By: Davina Poke D.O.   On: 01/04/2022 13:03  DG C-ARM BRONCHOSCOPY  Result Date: 01/06/2022 C-ARM BRONCHOSCOPY: Fluoroscopy was utilized by the requesting physician.  No radiographic interpretation.    Microbiology: Results for orders placed or performed during the hospital encounter of 01/03/22  SARS Coronavirus 2 by RT PCR (hospital order, performed in [facility] hospital lab) *cepheid single result test* Anterior Nasal Swab     Status: None   Collection Time: 01/03/22 11:47 PM   Specimen: Anterior Nasal Swab  Result Value Ref Range Status   SARS Coronavirus 2 by RT PCR NEGATIVE NEGATIVE Final    Comment: (NOTE) SARS-CoV-2 target nucleic acids are NOT DETECTED.  The SARS-CoV-2 RNA is generally detectable in upper and lower respiratory specimens during the acute phase of infection. The lowest concentration of SARS-CoV-2  viral copies this assay can detect is 250 copies / mL. A negative result does not preclude SARS-CoV-2 infection and should not be used as the sole basis for treatment or other patient management decisions.  A negative result may occur with improper specimen collection / handling, submission of specimen other than nasopharyngeal swab, presence of viral mutation(s) within the areas targeted by this assay, and inadequate number of viral copies (<250 copies / mL). A negative result must be combined with clinical observations, patient history, and epidemiological information.  Fact Sheet for Patients:   https://www.patel.info/  Fact Sheet for Healthcare Providers: https://hall.com/  This test is not yet approved or  cleared by the [place] FDA and has been authorized for detection and/or diagnosis of SARS-CoV-2 by FDA under an Emergency Use Authorization (EUA).  This EUA will remain in effect (meaning this test can be used) for the duration of the COVID-19 declaration under Section 564(b)(1) of the Act, 21 U.S.C. section 360bbb-3(b)(1), unless the authorization is terminated or revoked sooner.  Performed at [facility], [place] [address], [place], [place] [address]   Surgical PCR screen     Status: None   Collection Time: 01/03/22 11:47 PM   Specimen: Nasal Mucosa; Nasal Swab  Result Value Ref Range Status   MRSA, PCR NEGATIVE NEGATIVE Final   Staphylococcus aureus NEGATIVE NEGATIVE Final    Comment: (NOTE) The Xpert SA Assay (FDA approved for NASAL specimens in patients 29 years of age and older), is one component of a comprehensive surveillance program. It is not intended to diagnose infection nor to guide or monitor treatment. Performed at [facility], [place] [address], [place], [place] [address]   Culture, BAL-quantitative w Gram Stain     Status: None   Collection Time: 01/06/22  9:25 AM   Specimen: Bronchial Alveolar  Lavage; Respiratory  Result Value Ref Range Status   Specimen Description BRONCHIAL ALVEOLAR LAVAGE  Final   Special Requests BRONCHIAL ALVEOLAR LAVAGE  Final   Gram Stain NO WBC SEEN NO ORGANISMS SEEN   Final   Culture   Final    NO GROWTH 2 DAYS Performed at [facility], [address] [place], [place] [address]    Report Status 01/08/2022 FINAL  Final  Aerobic/Anaerobic Culture w Gram Stain (surgical/deep wound)     Status: None (Preliminary result)   Collection Time: 01/06/22  9:25 AM   Specimen: Bronchial Alveolar Lavage; Respiratory  Result Value Ref Range Status   Specimen Description BRONCHIAL ALVEOLAR LAVAGE  Final   Special Requests BRONCHIAL ALVEOLAR LAVAGE  Final   Gram Stain NO WBC SEEN NO ORGANISMS SEEN   Final   Culture   Final    NO  GROWTH 4 DAYS NO ANAEROBES ISOLATED; CULTURE IN PROGRESS FOR 5 DAYS Performed at [facility], [place] [address], [place], [place] [address]    Report Status PENDING  Incomplete  Acid Fast Smear (AFB)     Status: None   Collection Time: 01/06/22  9:25 AM   Specimen: Bronchial Alveolar Lavage; Respiratory  Result Value Ref Range Status   AFB Specimen Processing Concentration  Final   Acid Fast Smear Negative  Final    Comment: (NOTE) Performed At: [facility] [place], [place] [id number] Rush Farmer MD CV:[id number]    Source (AFB) BRONCHIAL ALVEOLAR LAVAGE  Final    Comment: Performed at [facility], [place] [address] [place], [place] [address]    Labs: CBC: Recent Labs  Lab 01/03/22 1823 01/04/22 1328 01/06/22 0422  WBC 9.1 10.0 5.3  NEUTROABS  --  9.1*  --   HGB 12.8 13.1 10.0*  HCT 36.5 37.2 28.4*  MCV 95.3 97.1 95.6  PLT 201 162 277*   Basic Metabolic Panel: Recent Labs  Lab 01/03/22 1823 01/03/22 2222 01/04/22 1328  NA 138  --  137  K 3.4*  --  4.1  CL 105  --  103  CO2 21*  --  24  GLUCOSE 136*  --  116*  BUN 16  --  12  CREATININE 1.00  --  0.89  CALCIUM  9.4  --  9.2  MG  --  1.9  --   PHOS  --  3.4  --    Liver Function Tests: Recent Labs  Lab 01/03/22 2222 01/04/22 1328  AST 28 40  ALT 25 25  ALKPHOS 57 60  BILITOT 0.4 0.6  PROT 6.4* 6.3*  ALBUMIN 3.7 3.7   CBG: Recent Labs  Lab 01/06/22 2048 01/07/22 0413 01/07/22 0817 01/07/22 1128 01/07/22 1156  GLUCAP 122* 100* 107* 67* 85    Discharge time spent: 39 minutes.   Signed: Hosie Poisson, MD [facility] 01/10/2022

## 2022-01-10 NOTE — Progress Notes (Signed)
Patient and husband called back requesting that PET scan be ordered prior to their appointment this Friday. They would prefer it not be delayed.  Spoke to Dr Marin Olp. Order placed and auth obtained. Scheduled for 01/14/22 prior to her appointment with this office.   Patient is aware of appointment date, time and location. She knows that she is to not take anything by mouth except for water.   Oncology Nurse Navigator Documentation     01/10/2022   11:30 AM  Oncology Nurse Navigator Flowsheets  Navigator Follow Up Date: 01/14/2022  Navigator Follow Up Reason: New Patient Appointment;Scan Review  Navigator Location [facility]  Navigator Encounter Type Telephone  Telephone Incoming Call;Outgoing Call  Patient Visit Type MedOnc  Treatment Phase Abnormal Scans  Barriers/Navigation Needs Coordination of Care;Education  Education Other  Interventions Coordination of Care;Education  Acuity Level 2-Minimal Needs (1-2 Barriers Identified)  Coordination of Care Radiology  Education Method Verbal;Teach-back  Support Groups/Services Friends and Family  Time Spent with Patient 30

## 2022-01-10 NOTE — Progress Notes (Signed)
Patient's path results not resulted this morning. Tonia Ghent at [facility] and she doesn't believe the results will be available before [name]'s new patient appointment this morning.   Spoke to Dr Marin Olp and he would like patient's appointment rescheduled to later this week so that he can have a meaningful conversation with her.   Called and spoke to patient. She is aware of new appointment date, time and location. She is also aware of the reason behind the rescheduling.   Oncology Nurse Navigator Documentation     01/10/2022    8:30 AM  Oncology Nurse Navigator Flowsheets  Navigator Follow Up Date: 01/14/2022  Navigator Follow Up Reason: New Patient Appointment  Navigator Location [facility]  Navigator Encounter Type Telephone  Telephone Appt Confirmation/Clarification;Education;Outgoing Call  Patient Visit Type MedOnc  Treatment Phase Abnormal Scans  Barriers/Navigation Needs Coordination of Care;Education  Education Other  Interventions Coordination of Care;Education  Acuity Level 2-Minimal Needs (1-2 Barriers Identified)  Coordination of Care Appts  Education Method Verbal  Time Spent with Patient 30

## 2022-01-11 ENCOUNTER — Ambulatory Visit: Payer: Medicare HMO | Admitting: Family Medicine

## 2022-01-11 LAB — AEROBIC/ANAEROBIC CULTURE W GRAM STAIN (SURGICAL/DEEP WOUND)
Culture: NO GROWTH
Gram Stain: NONE SEEN

## 2022-01-11 LAB — SURGICAL PATHOLOGY

## 2022-01-11 LAB — CYTOLOGY - NON PAP

## 2022-01-11 NOTE — Telephone Encounter (Signed)
RX request previously processed. No further action required.

## 2022-01-12 ENCOUNTER — Telehealth: Payer: Self-pay | Admitting: Emergency Medicine

## 2022-01-12 ENCOUNTER — Ambulatory Visit (INDEPENDENT_AMBULATORY_CARE_PROVIDER_SITE_OTHER): Payer: Medicare HMO | Admitting: Family Medicine

## 2022-01-12 ENCOUNTER — Telehealth: Payer: Self-pay | Admitting: Family Medicine

## 2022-01-12 VITALS — BP 122/70 | HR 82 | Temp 98.2°F | Resp 18 | Ht 59.0 in | Wt 168.0 lb

## 2022-01-12 DIAGNOSIS — Z7985 Long-term (current) use of injectable non-insulin antidiabetic drugs: Secondary | ICD-10-CM | POA: Diagnosis not present

## 2022-01-12 DIAGNOSIS — C3490 Malignant neoplasm of unspecified part of unspecified bronchus or lung: Secondary | ICD-10-CM | POA: Diagnosis not present

## 2022-01-12 DIAGNOSIS — I10 Essential (primary) hypertension: Secondary | ICD-10-CM

## 2022-01-12 DIAGNOSIS — I5032 Chronic diastolic (congestive) heart failure: Secondary | ICD-10-CM | POA: Diagnosis not present

## 2022-01-12 DIAGNOSIS — Z7902 Long term (current) use of antithrombotics/antiplatelets: Secondary | ICD-10-CM | POA: Diagnosis not present

## 2022-01-12 DIAGNOSIS — S72351D Displaced comminuted fracture of shaft of right femur, subsequent encounter for closed fracture with routine healing: Secondary | ICD-10-CM | POA: Diagnosis not present

## 2022-01-12 DIAGNOSIS — G4733 Obstructive sleep apnea (adult) (pediatric): Secondary | ICD-10-CM | POA: Diagnosis not present

## 2022-01-12 DIAGNOSIS — Z853 Personal history of malignant neoplasm of breast: Secondary | ICD-10-CM | POA: Diagnosis not present

## 2022-01-12 DIAGNOSIS — Z9989 Dependence on other enabling machines and devices: Secondary | ICD-10-CM | POA: Diagnosis not present

## 2022-01-12 DIAGNOSIS — Z7901 Long term (current) use of anticoagulants: Secondary | ICD-10-CM | POA: Diagnosis not present

## 2022-01-12 DIAGNOSIS — Z8673 Personal history of transient ischemic attack (TIA), and cerebral infarction without residual deficits: Secondary | ICD-10-CM | POA: Diagnosis not present

## 2022-01-12 DIAGNOSIS — I7300 Raynaud's syndrome without gangrene: Secondary | ICD-10-CM | POA: Diagnosis not present

## 2022-01-12 DIAGNOSIS — Z7982 Long term (current) use of aspirin: Secondary | ICD-10-CM | POA: Diagnosis not present

## 2022-01-12 DIAGNOSIS — K219 Gastro-esophageal reflux disease without esophagitis: Secondary | ICD-10-CM | POA: Diagnosis not present

## 2022-01-12 DIAGNOSIS — E119 Type 2 diabetes mellitus without complications: Secondary | ICD-10-CM | POA: Diagnosis not present

## 2022-01-12 DIAGNOSIS — W19XXXD Unspecified fall, subsequent encounter: Secondary | ICD-10-CM | POA: Diagnosis not present

## 2022-01-12 DIAGNOSIS — M84551D Pathological fracture in neoplastic disease, right femur, subsequent encounter for fracture with routine healing: Secondary | ICD-10-CM

## 2022-01-12 DIAGNOSIS — E785 Hyperlipidemia, unspecified: Secondary | ICD-10-CM | POA: Diagnosis not present

## 2022-01-12 DIAGNOSIS — I110 Hypertensive heart disease with heart failure: Secondary | ICD-10-CM | POA: Diagnosis not present

## 2022-01-12 NOTE — Progress Notes (Signed)
[facility] [address] [place], [place] [address] [phone]  Date:  01/12/2022   Name:  Laura Bush   DOB:  25-Feb-1951   MRN:  [id number]  PCP:  Darreld Mclean, MD    Chief Complaint: 6 month follow up (Concerns/ questions: 1. 2 remaining stitches in eyebrow. 2. Changes in BP meds. 3/ New Dx of Cancer. /DM eye exam:/Foot exam, urine MA due today/Tdap: none in ncir)   History of Present Illness:  Laura Bush is a 71 y.o. very pleasant female patient who presents with the following:  Here today for a 6 month follow-up visit and hospital discharge visit  I saw saw her in December -  PMH includes hypertension, venous insufficiency, GERD, hypercholesterolemia, and morbid obesity,  breast cancer diagnosed 2016 followed by oncology, hemochromatosis being managed by hematology  Unfortunately she also recently suffered a pathologic fracture of the right femur, has been diagnosed with primary lung cancer  Here today with her sister Ginger who is an important support for [name]  Admit date:     01/03/2022  Discharge date: 01/07/2022  Discharge Physician: Hosie Poisson   Recommendations at discharge:  Please follow up with PCP in one week.  Please follow up with the results of the biopsy with Dr Melida Gimenez.  Please follow up with Dr Lamonte Sakai as needed.  We have stopped hctz and telmisartan.  Discharge Diagnoses: Principal Problem:   Mass of lower lobe of left lung Active Problems:   Pathologic fracture of femur (HCC)   DM (diabetes mellitus), type 2 (HCC)   Essential hypertension   Hypercholesteremia   GERD (gastroesophageal reflux disease)   Laceration of left eyebrow   Malignant 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, estrogen receptor positive ([place])   OSA on CPAP   Aortic atherosclerosis (HCC)   CAD (coronary artery disease)   Resolved Problems:   Hypokalemia   Hospital  Course: 28yow PMH left breast cancer status post lumpectomy, XRT 2016, right breast ductal carcinoma in situ; released from oncology. Care 2021, was admitted 5/29 when she slipped in the bathroom and fell resulting in leg pain. Imaging revealed pathologic right femur fx. S/p surgery 5/30 w/ bone biopsy pending. Underwent biopsy of the  LLL via bronchoscopy later this weak. Wants to follow[facility] with [facility].   Assessment and Plan: Pathologic fracture of femur ([place]) --s/p low energy impact in patient w/ history of breast cancer; X-rays revealed lytic lesion lateral femoral cortex. CT C/A/P showed lung mass. CT R femur showed pathologic fracture. --s/p surgery 5/30, bone biopsy pending --management per orthopedics - pain is adequately controlled.  - therapy evaluations on board recommending supervision and assistance.  - please follow up with Dr Melida Gimenez for the biopsy results.    Mass of lower lobe of left lung --Irregular mass, with nodule inferior to it. Gross appearance is more suggestive of a bronchogenic neoplasm with intralobe synchronous primary or metastasis, but both could be metastatic from an extrathoracic primary in this patient with history of left breast cancer.  --PET-CT recommended --underwent navigational biopsy of the left lower lobe of the pulmonary nodule.  --will need outpatient follow-up with Dr Marin Olp / Judson Roch for the biopsy results.  DM (diabetes mellitus), type 2 ([place]) adequately controlled CBG'S.  NON insulin dependent.  Laceration of left eyebrow --s/p Prolene sutures x6 in ED 5/29 --remove sutures in 5-7 days 6/3-6/5 GERD (  gastroesophageal reflux disease) Stable, continue with Protonix.  Hypercholesteremia On statin, continue the same.  Essential hypertension BP parameters are adequately controlled.  Recommend restarting Nifedipine and hold amiloride  if BP is less than SBP<100 MMHG.  Meanwhile stop micardis and hydrochlorothiazide.  CAD (coronary  artery disease) --seen on chest CT; follow-up as an outpatient Constipation:  Start on senna and colace and miralax.  Consultants: [facility].   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  She is ambulating with a walker She just had an assessment for physical therapy She notes she really does not have any significant pain.  She does have pain pills at home she can use if needed Emotionally she feels that she is doing okay, she has quite a positive outlook.  I advised her that her emotional state may get worse as she settles into her course of treatment.  Please let me know if she needs any help in this regard Lab Results  Component Value Date   HGBA1C 5.3 01/03/2022      Patient Active Problem List   Diagnosis Date Noted   Laceration of left eyebrow 01/04/2022   Mass of lower lobe of left lung 01/04/2022   Aortic atherosclerosis ([place]) 01/04/2022   CAD (coronary artery disease) 01/04/2022   Pathologic fracture of femur ([place]) 01/03/2022   OSA on CPAP 01/03/2022   Impaired fasting glucose, with polyphagia 12/15/2021   Hemochromatosis 01/21/2021   Obesity (BMI 30-39.9) 14/78/2956   Metabolic syndrome 21/30/8657   Mixed hyperlipidemia 04/21/2020   Polyp of ascending colon 04/21/2020   History of breast cancer, Tamoxifen, nearing five years, Dr. Griffith Citron 04/21/2020    Osteopenia 10/02/2018   Malignant 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, estrogen receptor positive ([place]) 05/29/2017   Ductal carcinoma in situ (DCIS) of right breast 05/29/2017   Vitamin D deficiency 01/29/2016   Eczema 12/29/2014   Venous insufficiency 10/16/2014   Essential hypertension 10/01/2014   Hypercholesteremia 10/01/2014   GERD (gastroesophageal reflux disease) 10/01/2014   Other bilateral bundle branch block 06/23/2003    Past Medical History:  Diagnosis Date   Asthma    in cold weather   Breast cancer ([place]) 09/18/14   left breast   Breast cancer ([place]) 10/07/14   bx left breast   Breast cancer of upper-outer quadrant of left female breast ([place]) 09/22/2014   Cluster headaches    in her 98's   GERD (gastroesophageal reflux disease)    Heart murmur    as a child (has outgrown)   High cholesterol    Hypertension    Lower extremity edema    Microscopic colitis    Morbid obesity ([place]) 09/30/2019   Obesity (BMI 30-39.9) 11/11/2020   OSA on CPAP    Personal history of radiation therapy    Pneumonia 2000's X 1  PONV (postoperative nausea and vomiting)    Rosacea    S/P radiation therapy 12/30/14-01/27/15   left breast 50Gy total dose   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"   Swallowing difficulty    Venous insufficiency     Past Surgical History:  Procedure Laterality Date   ABDOMINAL HYSTERECTOMY  1999   APPENDECTOMY  ~ 2006   BONE BIOPSY Right 01/04/2022   Procedure: RIGHT FEMORAL BONE BIOPSY;  Surgeon: Altamese [place], MD;  Location: [facility];  Service: Orthopedics;  Laterality: Right;   BREAST BIOPSY Left 10/2014   BREAST LUMPECTOMY Left 2016   BREAST REDUCTION SURGERY Bilateral 11/11/2014   Procedure: Bilateral Breast Reduction;  Surgeon: Crissie Reese, MD;  Location: [facility];  Service: Plastics;  Laterality: Bilateral;   BRONCHIAL BIOPSY  01/06/2022   Procedure: BRONCHIAL BIOPSIES;  Surgeon: Collene Gobble, MD;  Location[facility] ENDOSCOPY;  Service: Pulmonary;;    BRONCHIAL BRUSHINGS  01/06/2022   Procedure: BRONCHIAL BRUSHINGS;  Surgeon: Collene Gobble, MD;  Location: [facility] ENDOSCOPY;  Service: Pulmonary;;   BRONCHIAL NEEDLE ASPIRATION BIOPSY  01/06/2022   Procedure: BRONCHIAL NEEDLE ASPIRATION BIOPSIES;  Surgeon: Collene Gobble, MD;  Location: [facility];  Service: Pulmonary;;   BRONCHIAL WASHINGS  01/06/2022   Procedure: BRONCHIAL WASHINGS;  Surgeon: Collene Gobble, MD;  Location: [facility] ENDOSCOPY;  Service: Pulmonary;;   CARPAL TUNNEL RELEASE Bilateral    2 surgeries on right, 1 on left   CESAREAN SECTION  1978; [place] IM NAIL Right 01/04/2022   Procedure: RIGHT FEMORAL INTRAMEDULLARY (IM) NAIL;  Surgeon: Altamese [place], MD;  Location: [facility];  Service: Orthopedics;  Laterality: Right;   FIDUCIAL MARKER PLACEMENT  01/06/2022   Procedure: FIDUCIAL MARKER PLACEMENT;  Surgeon: Collene Gobble, MD;  Location: [facility] ENDOSCOPY;  Service: Pulmonary;;   FOREARM FRACTURE SURGERY Right 2003 X 3   KNEE ARTHROSCOPY Bilateral    meniscus repair   PARTIAL MASTECTOMY WITH NEEDLE LOCALIZATION AND AXILLARY SENTINEL LYMPH NODE BX Left 11/11/2014   Procedure: LEFT BREAST PARTIAL MASTECTOMY WITH NEEDLE LOCALIZATION TIMES TWO AND LEFT AXILLARY SENTINEL LYMPH NODE Biopsy;  Surgeon: Fanny Skates, MD;  Location: [facility];  Service: General;  Laterality: Left;   SQUAMOUS CELL CARCINOMA EXCISION  1980's X 1   "nose"   TONSILLECTOMY  ~ 1957   TUBAL LIGATION  ?1984    Social History   Tobacco Use   Smoking status: Never   Smokeless tobacco: Never  Vaping Use   Vaping Use: Never used  Substance Use Topics   Alcohol use: Yes    Alcohol/week: 2.0 standard drinks    Types: 2 Glasses of wine per week    Comment: 2 glasses a week   Drug use: No    Family History  Problem Relation Age of Onset   Dementia Mother        Lives in [place]   Hypertension Mother    Hyperlipidemia Mother    Stroke Father        deceased   Heart failure Father     Hypertension Father    Uterine cancer Sister 40   Heart attack Maternal Grandmother    Hypertension Maternal Grandfather    Stroke Paternal Grandfather    Colon cancer Neg Hx    Esophageal cancer Neg Hx    Pancreatic cancer Neg Hx    Stomach cancer Neg Hx    Liver disease Neg Hx    Sleep apnea Neg  Hx     Allergies  Allergen Reactions   Diclofenac Hypertension   Amlodipine Swelling and Other (See Comments)    Limbs swell, not the throat   Lanolin Other (See Comments)    Sneezing and watery eyes. Allergic to [facility].   Tape Other (See Comments)    Redness ([place] also)    Medication list has been reviewed and updated.  Current Outpatient Medications on File Prior to Visit  Medication Sig Dispense Refill   acetaminophen (TYLENOL) 500 MG tablet Take 1 tablet (500 mg total) by mouth every 8 (eight) hours as needed for mild pain or moderate pain. 30 tablet 0   aMILoride (MIDAMOR) 5 MG tablet Take 1 tablet (5 mg total) by mouth daily. (Patient taking differently: Take 5 mg by mouth in the morning.) 90 tablet 3   apixaban (ELIQUIS) 2.5 MG TABS tablet Take 1 tablet (2.5 mg total) by mouth 2 (two) times daily. 60 tablet 0   aspirin EC 81 MG tablet Take 81 mg by mouth daily. Swallow whole.     B Complex-C (SUPER B COMPLEX PO) Take 1 tablet by mouth daily with breakfast.     Calcium Carb-Cholecalciferol (CALCIUM + D3 PO) Take 1 tablet by mouth daily.     fexofenadine (ALLEGRA) 180 MG tablet Take 180 mg by mouth in the morning.     fluticasone (CUTIVATE) 0.05 % cream Apply 1 application. topically See admin instructions. Apply to dry area of the left thigh once a day     HYDROcodone-acetaminophen (NORCO/VICODIN) 5-325 MG tablet Take 1-2 tablets by mouth every 8 (eight) hours as needed for moderate pain or severe pain. 30 tablet 0   ipratropium (ATROVENT) 0.06 % nasal spray Place 2 sprays into both nostrils 4 (four) times daily. Use as needed (Patient taking differently: Place 2 sprays into  both nostrils 4 (four) times daily as needed for rhinitis.) 15 mL 4   methocarbamol (ROBAXIN) 500 MG tablet Take 1 tablet (500 mg total) by mouth every 6 (six) hours as needed for muscle spasms. 30 tablet 0   NIFEdipine (PROCARDIA-XL/NIFEDICAL-XL) 30 MG 24 hr tablet Take 1 tablet (30 mg total) by mouth daily. 30 tablet 3   pantoprazole (PROTONIX) 40 MG tablet Take 1 tablet (40 mg total) by mouth daily. (Patient taking differently: Take 40 mg by mouth daily before breakfast.) 90 tablet 3   Semaglutide, 2 MG/DOSE, (OZEMPIC, 2 MG/DOSE,) 8 MG/3ML SOPN Inject 2 mg into the skin once a week. (Patient taking differently: Inject 2 mg into the skin every Saturday.) 9 mL 0   simvastatin (ZOCOR) 20 MG tablet Take 1 tablet (20 mg total) by mouth daily. (Patient taking differently: Take 20 mg by mouth every evening.) 90 tablet 3   No current facility-administered medications on file prior to visit.    Review of Systems:  As per HPI- otherwise negative.   Physical Examination: Vitals:   01/12/22 1037  BP: 122/70  Pulse: 82  Resp: 18  Temp: 98.2 F (36.8 C)  SpO2: 99%   Vitals:   01/12/22 1037  Weight: 168 lb (76.2 kg)  Height: 4\' 11"  (1.499 m)   Body mass index is 33.93 kg/m. Ideal Body Weight: Weight in (lb) to have BMI = 25: 123.5  GEN: no acute distress.  Looks well, sitting in wheelchair HEENT: Atraumatic, Normocephalic.  Patient has a scab over her left brow.  This is wiped gently with damp gauze, laceration is well-healed Ears and Nose: No external deformity. CV: RRR,  No M/G/R. No JVD. No thrill. No extra heart sounds. PULM: CTA B, no wheezes, crackles, rhonchi. No retractions. No resp. distress. No accessory muscle use. EXTR: No c/c/e PSYCH: Normally interactive. Conversant.  She has proximal and distal laparoscopic incisions over her right thigh from femur surgery, no evidence of infection.  There is some edema of the leg but no tightness or tenderness of the calf  Assessment and  Plan: Essential hypertension  Raynaud's disease without gangrene  Primary malignant neoplasm of lung, unspecified laterality (HCC)  Pathological fracture in neoplastic disease, right femur, subsequent encounter for fracture with routine healing  Patient seen today for follow-up from recent hospitalization-pathologic femur fracture which led to diagnosis of primary lung cancer, she has never smoked  PET scan, hematology follow-up, orthopedics follow-up in place Her main question right now is that her blood pressure medicine.  She had been taking hydrochlorothiazide previously, this was held on hospital discharge.  She would like to use this as needed if possible for swelling.  Advised her 12.5 mg daily as needed should be okay for now.  As she comes more active and her blood pressure comes up we may need to increase to daily, we can increase to 25 mg if needed  She was started on amiloride in the hospital, it looks like she had 1 potassium reading of 3.4-previously her potassium has never been an issue.  Reasonable to go back to hydrochlorothiazide, she is not taking amiloride right now  She will likely have lab work done per hematology later this week, I will touch base with them about her potassium  Patient also reports she was told she had diabetes while inpatient and that she needed frequent glucose checks and was receiving sliding scale insulin.  She does not have diabetes.  She has been taking Ozempic for weight loss Advise she can continue Ozempic for now, but this may be stopped especially if she has to undergo chemotherapy    Signed Lamar Blinks, MD

## 2022-01-12 NOTE — Telephone Encounter (Signed)
Reviewed path results w the patient by phone > lung nodule bx's and the R LE bony bx's are both consistent with adenoCA of lung primary. She has an OV with Dr Marin Olp on Friday

## 2022-01-12 NOTE — Telephone Encounter (Signed)
Caller/Agency: [facility] Number: [phone] Requesting OT/PT/Skilled Nursing/Social Work/Speech Therapy: PT Frequency: 3x for 2 weeks then 2x for 3 weeks. [place] wanted to mention pt will be going out of town so PT will start this and resume the week of 6/19.

## 2022-01-12 NOTE — Telephone Encounter (Signed)
Verbal orders given  

## 2022-01-12 NOTE — Patient Instructions (Addendum)
It was good to see you today- we will be thinking about you!    Please see me in 3-4 months as needed for recheck Let me know if you need anything at all from me as far as support Ok to use hctz 12.5 daily as needed for swelling- as you get stronger and your BP goes up you my need to take it daily and/ or go up to 25 mg

## 2022-01-13 DIAGNOSIS — S72351D Displaced comminuted fracture of shaft of right femur, subsequent encounter for closed fracture with routine healing: Secondary | ICD-10-CM | POA: Diagnosis not present

## 2022-01-13 DIAGNOSIS — Z8673 Personal history of transient ischemic attack (TIA), and cerebral infarction without residual deficits: Secondary | ICD-10-CM | POA: Diagnosis not present

## 2022-01-13 DIAGNOSIS — I5032 Chronic diastolic (congestive) heart failure: Secondary | ICD-10-CM | POA: Diagnosis not present

## 2022-01-13 DIAGNOSIS — Z9989 Dependence on other enabling machines and devices: Secondary | ICD-10-CM | POA: Diagnosis not present

## 2022-01-13 DIAGNOSIS — Z853 Personal history of malignant neoplasm of breast: Secondary | ICD-10-CM | POA: Diagnosis not present

## 2022-01-13 DIAGNOSIS — W19XXXD Unspecified fall, subsequent encounter: Secondary | ICD-10-CM | POA: Diagnosis not present

## 2022-01-13 DIAGNOSIS — Z7902 Long term (current) use of antithrombotics/antiplatelets: Secondary | ICD-10-CM | POA: Diagnosis not present

## 2022-01-13 DIAGNOSIS — I110 Hypertensive heart disease with heart failure: Secondary | ICD-10-CM | POA: Diagnosis not present

## 2022-01-13 DIAGNOSIS — Z7901 Long term (current) use of anticoagulants: Secondary | ICD-10-CM | POA: Diagnosis not present

## 2022-01-13 DIAGNOSIS — E785 Hyperlipidemia, unspecified: Secondary | ICD-10-CM | POA: Diagnosis not present

## 2022-01-13 DIAGNOSIS — E119 Type 2 diabetes mellitus without complications: Secondary | ICD-10-CM | POA: Diagnosis not present

## 2022-01-13 DIAGNOSIS — Z7985 Long-term (current) use of injectable non-insulin antidiabetic drugs: Secondary | ICD-10-CM | POA: Diagnosis not present

## 2022-01-13 DIAGNOSIS — K219 Gastro-esophageal reflux disease without esophagitis: Secondary | ICD-10-CM | POA: Diagnosis not present

## 2022-01-13 DIAGNOSIS — G4733 Obstructive sleep apnea (adult) (pediatric): Secondary | ICD-10-CM | POA: Diagnosis not present

## 2022-01-13 DIAGNOSIS — Z7982 Long term (current) use of aspirin: Secondary | ICD-10-CM | POA: Diagnosis not present

## 2022-01-14 ENCOUNTER — Inpatient Hospital Stay: Payer: Medicare HMO | Attending: Family

## 2022-01-14 ENCOUNTER — Encounter: Payer: Self-pay | Admitting: Hematology & Oncology

## 2022-01-14 ENCOUNTER — Inpatient Hospital Stay: Payer: Medicare HMO | Admitting: Hematology & Oncology

## 2022-01-14 ENCOUNTER — Encounter: Payer: Self-pay | Admitting: *Deleted

## 2022-01-14 ENCOUNTER — Other Ambulatory Visit: Payer: Self-pay

## 2022-01-14 ENCOUNTER — Other Ambulatory Visit: Payer: Self-pay | Admitting: Oncology

## 2022-01-14 ENCOUNTER — Encounter (HOSPITAL_COMMUNITY)
Admission: RE | Admit: 2022-01-14 | Discharge: 2022-01-14 | Disposition: A | Discharge status: Home / Self Care | Payer: Medicare HMO | Source: Ambulatory Visit | Attending: Hematology & Oncology | Admitting: Hematology & Oncology

## 2022-01-14 VITALS — BP 135/56 | HR 80 | Temp 98.7°F | Resp 18 | Wt 164.0 lb

## 2022-01-14 DIAGNOSIS — Z7901 Long term (current) use of anticoagulants: Secondary | ICD-10-CM | POA: Insufficient documentation

## 2022-01-14 DIAGNOSIS — C50812 Malignant neoplasm of overlapping sites of left female breast: Secondary | ICD-10-CM

## 2022-01-14 DIAGNOSIS — Z170 Estrogen receptor positive status [ER+]: Secondary | ICD-10-CM | POA: Insufficient documentation

## 2022-01-14 DIAGNOSIS — J984 Other disorders of lung: Secondary | ICD-10-CM | POA: Diagnosis not present

## 2022-01-14 DIAGNOSIS — R918 Other nonspecific abnormal finding of lung field: Secondary | ICD-10-CM | POA: Diagnosis not present

## 2022-01-14 DIAGNOSIS — C7951 Secondary malignant neoplasm of bone: Secondary | ICD-10-CM

## 2022-01-14 DIAGNOSIS — Z7189 Other specified counseling: Secondary | ICD-10-CM | POA: Diagnosis not present

## 2022-01-14 DIAGNOSIS — K8020 Calculus of gallbladder without cholecystitis without obstruction: Secondary | ICD-10-CM | POA: Diagnosis not present

## 2022-01-14 DIAGNOSIS — C3490 Malignant neoplasm of unspecified part of unspecified bronchus or lung: Secondary | ICD-10-CM | POA: Diagnosis not present

## 2022-01-14 DIAGNOSIS — C7989 Secondary malignant neoplasm of other specified sites: Secondary | ICD-10-CM | POA: Diagnosis not present

## 2022-01-14 DIAGNOSIS — C3432 Malignant neoplasm of lower lobe, left bronchus or lung: Secondary | ICD-10-CM | POA: Diagnosis not present

## 2022-01-14 DIAGNOSIS — Z853 Personal history of malignant neoplasm of breast: Secondary | ICD-10-CM | POA: Diagnosis not present

## 2022-01-14 DIAGNOSIS — I2510 Atherosclerotic heart disease of native coronary artery without angina pectoris: Secondary | ICD-10-CM | POA: Diagnosis not present

## 2022-01-14 HISTORY — DX: Other specified counseling: Z71.89

## 2022-01-14 HISTORY — DX: Secondary malignant neoplasm of bone: C79.51

## 2022-01-14 LAB — CMP (CANCER CENTER ONLY)
ALT: 16 U/L (ref 0–44)
AST: 17 U/L (ref 15–41)
Albumin: 4 g/dL (ref 3.5–5.0)
Alkaline Phosphatase: 81 U/L (ref 38–126)
Anion gap: 7 (ref 5–15)
BUN: 16 mg/dL (ref 8–23)
CO2: 26 mmol/L (ref 22–32)
Calcium: 9.8 mg/dL (ref 8.9–10.3)
Chloride: 104 mmol/L (ref 98–111)
Creatinine: 0.76 mg/dL (ref 0.44–1.00)
GFR, Estimated: >60 mL/min (ref >60)
Glucose, Bld: 109 mg/dL — ABNORMAL HIGH (ref 70–99)
Potassium: 3.5 mmol/L (ref 3.5–5.1)
Sodium: 137 mmol/L (ref 135–145)
Total Bilirubin: 0.5 mg/dL (ref 0.3–1.2)
Total Protein: 6.3 g/dL — ABNORMAL LOW (ref 6.5–8.1)

## 2022-01-14 LAB — CBC WITH DIFFERENTIAL (CANCER CENTER ONLY)
Abs Immature Granulocytes: 0.03 10*3/uL (ref 0.00–0.07)
Basophils Absolute: 0 10*3/uL (ref 0.0–0.1)
Basophils Relative: 1 %
Eosinophils Absolute: 0.1 10*3/uL (ref 0.0–0.5)
Eosinophils Relative: 2 %
HCT: 31.1 % — ABNORMAL LOW (ref 36.0–46.0)
Hemoglobin: 10.5 g/dL — ABNORMAL LOW (ref 12.0–15.0)
Immature Granulocytes: 1 %
Lymphocytes Relative: 21 %
Lymphs Abs: 1.2 10*3/uL (ref 0.7–4.0)
MCH: 32.9 pg (ref 26.0–34.0)
MCHC: 33.8 g/dL (ref 30.0–36.0)
MCV: 97.5 fL (ref 80.0–100.0)
Monocytes Absolute: 0.5 10*3/uL (ref 0.1–1.0)
Monocytes Relative: 9 %
Neutro Abs: 3.7 10*3/uL (ref 1.7–7.7)
Neutrophils Relative %: 66 %
Platelet Count: 272 10*3/uL (ref 150–400)
RBC: 3.19 MIL/uL — ABNORMAL LOW (ref 3.87–5.11)
RDW: 12.5 % (ref 11.5–15.5)
WBC Count: 5.5 10*3/uL (ref 4.0–10.5)
nRBC: 0 % (ref 0.0–0.2)

## 2022-01-14 LAB — PREALBUMIN: Prealbumin: 24 mg/dL (ref 18–38)

## 2022-01-14 LAB — CEA (IN HOUSE-CHCC): CEA (CHCC-In House): 16.9 ng/mL — ABNORMAL HIGH (ref 0.00–5.00)

## 2022-01-14 LAB — GLUCOSE, CAPILLARY: Glucose-Capillary: 99 mg/dL (ref 70–99)

## 2022-01-14 LAB — LACTATE DEHYDROGENASE: LDH: 198 U/L — ABNORMAL HIGH (ref 98–192)

## 2022-01-14 MED ORDER — FLUDEOXYGLUCOSE F - 18 (FDG) INJECTION
8.1000 | Freq: Once | INTRAVENOUS | Status: AC | PRN
  Administered 2022-01-14: 8.1 via INTRAVENOUS

## 2022-01-14 NOTE — Progress Notes (Signed)
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  I needed surgery for my right thigh.  HPI: [name] is an incredibly charming 71 year old white female.  She actually has been seen in our office before because of hemochromatosis.  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  She has a very interesting history.  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  I think this was back in 2015.  I think this was stage I.  She had a lumpectomy.  She had radiation therapy afterwards.  She was placed on tamoxifen.  She has had bilateral breast reductions.  She began to have some pain in the left leg.  This was several weeks ago.  She denies any kind of trauma.  She had a 40 pound intentional weight loss.  She has been going to the [employer] clinic.  She began to have more in the way of pain.  She was using a cane.  However, she apparently I think lost her balance.  She subsequently I think twisted and she fractured her femur.  This was of the right femur.  On 01/03/2022, she had an x-ray which showed a displaced fracture of the proximal right femur.  At the time, there is no cough.  There is no shortness of breath.  She had no chest wall pain.  She is not a smoker.  She has never smoked.  She had a CT scan of the body and femur on 01/03/2022.  This did show a mass in the left lower lung measuring 1.7 x 1.2 cm.  This was suggestive of a bronchogenic carcinoma.  She had no mediastinal or hilar adenopathy.  She had too small to characterize lesion in the left lobe of the liver.  Her spine looked okay.  She has some arthritic lesions.  CT of the femur showed an acute displaced fracture of the mid femur lytic bone lesions.  She underwent pinning of the right femur.  I think this was done on  30 May.  She did have biopsies sent off.  The pathology report ([id number]) showed metastatic adenocarcinoma consistent with a lung primary.  She then was seen by Dr. Lamonte Sakai of a pulmonary medicine.  He did a bronchoscopy on her.  This was done on 01/06/2022.  The pathology report ([id number]) showed malignant cells consistent with adenocarcinoma.  She had a PET scan done today.  She actually looks quite good.  She comes in with her husband.  They are both incredibly charming to talk to.  She is not complaining of any pain.  She is recovering from the surgery.  She gets around with a rolling walker.  Again, she has never smoked.  There is no history of cancer in the family.  I would have to say that at the present time, her performance status is probably ECOG 1.   Past Medical History:  Diagnosis Date   Asthma    in cold weather   Breast cancer ([place]) 09/18/14   left breast   Breast cancer ([place]) 10/07/14   bx left breast   Breast cancer of upper-outer quadrant of left female breast ([place]) 09/22/2014   Cluster headaches    in her 40's   GERD (gastroesophageal reflux disease)    Heart murmur  as a child (has outgrown)   High cholesterol    Hypertension    Lower extremity edema    Microscopic colitis    Morbid obesity ([place]) 09/30/2019   Obesity (BMI 30-39.9) 11/11/2020   OSA on CPAP    Personal history of radiation therapy    Pneumonia 2000's X 1   PONV (postoperative nausea and vomiting)    Rosacea    S/P radiation therapy 12/30/14-01/27/15   left breast 50Gy total dose   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"   Swallowing difficulty    Venous insufficiency   :   Past Surgical History:  Procedure Laterality Date   ABDOMINAL HYSTERECTOMY  1999   APPENDECTOMY  ~ 2006   BONE BIOPSY Right 01/04/2022   Procedure: RIGHT FEMORAL BONE BIOPSY;  Surgeon: Altamese Point Pleasant, MD;  Location: [facility];  Service: Orthopedics;  Laterality: Right;   BREAST BIOPSY Left 10/2014   BREAST LUMPECTOMY  Left 2016   BREAST REDUCTION SURGERY Bilateral 11/11/2014   Procedure: Bilateral Breast Reduction;  Surgeon: Crissie Reese, MD;  Location: [facility];  Service: Plastics;  Laterality: Bilateral;   BRONCHIAL BIOPSY  01/06/2022   Procedure: BRONCHIAL BIOPSIES;  Surgeon: Collene Gobble, MD;  Location: [facility] ENDOSCOPY;  Service: Pulmonary;;   BRONCHIAL BRUSHINGS  01/06/2022   Procedure: BRONCHIAL BRUSHINGS;  Surgeon: Collene Gobble, MD;  Location: [facility] ENDOSCOPY;  Service: Pulmonary;;   BRONCHIAL NEEDLE ASPIRATION BIOPSY  01/06/2022   Procedure: BRONCHIAL NEEDLE ASPIRATION BIOPSIES;  Surgeon: Collene Gobble, MD;  Location: [place];  Service: Pulmonary;;   BRONCHIAL WASHINGS  01/06/2022   Procedure: BRONCHIAL WASHINGS;  Surgeon: Collene Gobble, MD;  Location: [facility] ENDOSCOPY;  Service: Pulmonary;;   CARPAL TUNNEL RELEASE Bilateral    2 surgeries on right, 1 on left   CESAREAN SECTION  1978; [place] IM NAIL Right 01/04/2022   Procedure: RIGHT FEMORAL INTRAMEDULLARY (IM) NAIL;  Surgeon: Altamese Vernon, MD;  Location: [place];  Service: Orthopedics;  Laterality: Right;   FIDUCIAL MARKER PLACEMENT  01/06/2022   Procedure: FIDUCIAL MARKER PLACEMENT;  Surgeon: Collene Gobble, MD;  Location: [place] ENDOSCOPY;  Service: Pulmonary;;   FOREARM FRACTURE SURGERY Right 2003 X 3   KNEE ARTHROSCOPY Bilateral    meniscus repair   PARTIAL MASTECTOMY WITH NEEDLE LOCALIZATION AND AXILLARY SENTINEL LYMPH NODE BX Left 11/11/2014   Procedure: LEFT BREAST PARTIAL MASTECTOMY WITH NEEDLE LOCALIZATION TIMES TWO AND LEFT AXILLARY SENTINEL LYMPH NODE Biopsy;  Surgeon: Fanny Skates, MD;  Location: [facility];  Service: General;  Laterality: Left;   SQUAMOUS CELL CARCINOMA EXCISION  1980's X 1   "nose"   TONSILLECTOMY  ~ [place]  ?1984  :   Current Outpatient Medications:    acetaminophen (TYLENOL) 500 MG tablet, Take 1 tablet (500 mg total) by mouth every 8 (eight) hours as needed for mild pain or moderate  pain., Disp: 30 tablet, Rfl: 0   aMILoride (MIDAMOR) 5 MG tablet, Take 1 tablet (5 mg total) by mouth daily. (Patient taking differently: Take 5 mg by mouth in the morning.), Disp: 90 tablet, Rfl: 3   apixaban (ELIQUIS) 2.5 MG TABS tablet, Take 1 tablet (2.5 mg total) by mouth 2 (two) times daily., Disp: 60 tablet, Rfl: 0   aspirin EC 81 MG tablet, Take 81 mg by mouth daily. Swallow whole., Disp: , Rfl:    B Complex-C (SUPER B COMPLEX PO), Take 1 tablet by mouth daily  with breakfast., Disp: , Rfl:    Calcium Carb-Cholecalciferol (CALCIUM + D3 PO), Take 1 tablet by mouth daily., Disp: , Rfl:    carvedilol (COREG) 6.25 MG tablet, Take 6.25 mg by mouth 2 (two) times daily., Disp: , Rfl:    fexofenadine (ALLEGRA) 180 MG tablet, Take 180 mg by mouth in the morning., Disp: , Rfl:    fluticasone (CUTIVATE) 0.05 % cream, Apply 1 application. topically See admin instructions. Apply to dry area of the left thigh once a day, Disp: , Rfl:    HYDROcodone-acetaminophen (NORCO/VICODIN) 5-325 MG tablet, Take 1-2 tablets by mouth every 8 (eight) hours as needed for moderate pain or severe pain., Disp: 30 tablet, Rfl: 0   ipratropium (ATROVENT) 0.06 % nasal spray, Place 2 sprays into both nostrils 4 (four) times daily. Use as needed (Patient taking differently: Place 2 sprays into both nostrils 4 (four) times daily as needed for rhinitis.), Disp: 15 mL, Rfl: 4   methocarbamol (ROBAXIN) 500 MG tablet, Take 1 tablet (500 mg total) by mouth every 6 (six) hours as needed for muscle spasms., Disp: 30 tablet, Rfl: 0   NIFEdipine (PROCARDIA-XL/NIFEDICAL-XL) 30 MG 24 hr tablet, Take 1 tablet (30 mg total) by mouth daily., Disp: 30 tablet, Rfl: 3   pantoprazole (PROTONIX) 40 MG tablet, Take 1 tablet (40 mg total) by mouth daily. (Patient taking differently: Take 40 mg by mouth daily before breakfast.), Disp: 90 tablet, Rfl: 3   Semaglutide, 2 MG/DOSE, (OZEMPIC, 2 MG/DOSE,) 8 MG/3ML SOPN, Inject 2 mg into the skin once a week.  (Patient taking differently: Inject 2 mg into the skin every Saturday.), Disp: 9 mL, Rfl: 0   simvastatin (ZOCOR) 20 MG tablet, Take 1 tablet (20 mg total) by mouth daily. (Patient taking differently: Take 20 mg by mouth every evening.), Disp: 90 tablet, Rfl: 3:  :   Allergies  Allergen Reactions   Diclofenac Hypertension   Amlodipine Swelling and Other (See Comments)    Limbs swell, not the throat   Lanolin Other (See Comments)    Sneezing and watery eyes. Allergic to [facility].   Tape Other (See Comments)    Redness (Bandaids also)  :   Family History  Problem Relation Age of Onset   Dementia Mother        Lives in [place]   Hypertension Mother    Hyperlipidemia Mother    Stroke Father        deceased   Heart failure Father    Hypertension Father    Uterine cancer Sister 38   Heart attack Maternal Grandmother    Hypertension Maternal Grandfather    Stroke Paternal Grandfather    Colon cancer Neg Hx    Esophageal cancer Neg Hx    Pancreatic cancer Neg Hx    Stomach cancer Neg Hx    Liver disease Neg Hx    Sleep apnea Neg Hx   :   Social History   Socioeconomic History   Marital status: Married    Spouse name: Randy Laura Bush   Number of children: Not on file   Years of education: Not on file   Highest education level: Not on file  Occupational History   Occupation: retired  Tobacco Use   Smoking status: Never   Smokeless tobacco: Never  Vaping Use   Vaping Use: Never used  Substance and Sexual Activity   Alcohol use: Yes    Alcohol/week: 2.0 standard drinks of alcohol    Types: 2 Glasses of wine  per week    Comment: 2 glasses a week   Drug use: No   Sexual activity: Not Currently  Other Topics Concern   Not on file  Social History Narrative   From [place].   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 Update 02/09/2021 retired   2 children, boy and girl   Has 4 grandchildren   Quilting, sewing, reading.   Social  Determinants of Health   Financial Resource Strain: Low Risk  (10/26/2021)   Overall Financial Resource Strain (CARDIA)    Difficulty of Paying Living Expenses: Not hard at all  Food Insecurity: No Food Insecurity (10/19/2021)   Hunger Vital Sign    Worried About Running Out of Food in the Last Year: Never true    Ran Out of Food in the Last Year: Never true  Transportation Needs: No Transportation Needs (11/11/2020)   PRAPARE - Hydrologist (Medical): No    Lack of Transportation (Non-Medical): No  Physical Activity: Insufficiently Active (10/26/2021)   Exercise Vital Sign    Days of Exercise per Week: 2 days    Minutes of Exercise per Session: 60 min  Stress: Stress Concern Present (11/11/2020)   [place]    Feeling of Stress : To some extent  Social Connections: Socially Integrated (10/19/2021)   Social Connection and Isolation Panel [NHANES]    Frequency of Communication with Friends and Family: More than three times a week    Frequency of Social Gatherings with Friends and Family: Three times a week    Attends Religious Services: More than 4 times per year    Active Member of Clubs or Organizations: Yes    Attends Archivist Meetings: More than 4 times per year    Marital Status: Married  Human resources officer Violence: Not At Risk (10/19/2021)   Humiliation, Afraid, Rape, and Kick questionnaire    Fear of Current or Ex-Partner: No    Emotionally Abused: No    Physically Abused: No    Sexually Abused: No  :  Review of Systems  Constitutional:  Positive for weight loss.  HENT: Negative.    Eyes: Negative.   Respiratory: Negative.    Cardiovascular: Negative.   Gastrointestinal: Negative.   Genitourinary: Negative.   Musculoskeletal:  Positive for joint pain.  Skin: Negative.   Neurological: Negative.   Endo/Heme/Allergies: Negative.   Psychiatric/Behavioral: Negative.        Exam: @IPVITALS @ Physical Exam Vitals reviewed.  HENT:     Head: Normocephalic and atraumatic.  Eyes:     Pupils: Pupils are equal, round, and reactive to light.  Cardiovascular:     Rate and Rhythm: Normal rate and regular rhythm.     Heart sounds: Normal heart sounds.     Comments: Cardiac exam shows a regular rate and rhythm with normal S1 and S2.  She has no murmurs, rubs or bruits. Pulmonary:     Effort: Pulmonary effort is normal.     Breath sounds: Normal breath sounds.     Comments: Lungs sound clear bilaterally.  She has good breath sounds bilaterally.  I hear no wheezes or rubs. Abdominal:     General: Bowel sounds are normal.     Palpations: Abdomen is soft.  Musculoskeletal:        General: No tenderness or deformity. Normal range of motion.     Cervical back: Normal range of motion.  Comments: Her extremities show some swelling in the right leg.  She has the healing surgical scars from the hip pinning.  She has relatively decent range of motion.  She has good pulses.  Lymphadenopathy:     Cervical: No cervical adenopathy.  Skin:    General: Skin is warm and dry.     Findings: No erythema or rash.  Neurological:     Mental Status: She is alert and oriented to person, place, and time.  Psychiatric:        Behavior: Behavior normal.        Thought Content: Thought content normal.        Judgment: Judgment normal.    Recent Labs    01/14/22 1356  WBC 5.5  HGB 10.5*  HCT 31.1*  PLT 272    Recent Labs    01/14/22 1356  NA 137  K 3.5  CL 104  CO2 26  GLUCOSE 109*  BUN 16  CREATININE 0.76  CALCIUM 9.8    Blood smear review: None  Pathology: See above    Assessment and Plan: [name] is a very charming 71 year old white female.  She has a history of a stage I ductal carcinoma of the left breast.  This was 7 years ago.  This was treated and cured in my opinion.  She now has a stage IV non-small cell lung cancer.  This originated in the  left lung.  It would be interesting to see what the molecular markers are.  We have sent this off.  Given the fact that she has never smoked, mom would have to think that there might be the chance that she could have a molecular marker that we can target.  We will have to see what the PET scan shows.  When I looked at the PET scan, I do not see anything that looked obvious for a lot of metastatic disease.  Looks like she may have oligometastatic disease.  She will need to have an MRI of the brain.  I know she has no symptoms of neurologic compromise.  However, we do need to get this set up.  She will need radiation therapy to the right hip, at least.  Once we get the PET scan result back, then we can see if she will need radiation therapy to any other parts of her skeleton.  As far as systemic therapy, this will really be dictated by her molecular markers.  Again, hopefully she will have a mutation that we can use targeted therapy for.  If not, we will have to use chemotherapy along with immunotherapy.  She and her family will be going to the [employer].  They will be gone for a week.  I do not see any problems with her going there.  She just needs to make sure she is hydrated.  She does wear sunscreen.  We will also need to put her on Xgeva.  Once we get the molecular markers back, and the MRI result back, and the PET scan result, we will be able to have some type of treatment plan for her.  I told her that this is stage IV lung cancer.  I told her that this cannot be cured but can be treated.  Again, if she does have a molecular target, we could certainly get several years of response to this.  It is really hard to talk about actual prognosis until we see what the molecular markers show.  Again, she  and her husband are incredibly nice.  They are very well read and are very knowledgeable about her disease.  We will plan to get her back once we have our results back from our other  studies.

## 2022-01-14 NOTE — Progress Notes (Signed)
Per Dr Marin Olp, request for [facility] testing sent on [id number] DOS 01/06/2022.  Oncology Nurse Navigator Documentation     01/14/2022   10:30 AM  Oncology Nurse Navigator Flowsheets  Confirmed Diagnosis Date 01/06/2022  Diagnosis Status Pending Molecular Studies  Navigator Location [facility]  Navigator Encounter Type Pathology Review;Molecular Studies  Patient Visit Type MedOnc  Treatment Phase Pre-Tx/Tx Discussion  Barriers/Navigation Needs Coordination of Care;Education  Interventions Coordination of Care  Acuity Level 2-Minimal Needs (1-2 Barriers Identified)  Support Groups/Services Friends and Family  Time Spent with Patient 30

## 2022-01-14 NOTE — Progress Notes (Signed)
Initial RN Navigator Patient Visit  Name: Laura Bush [name] Diagnosis: Metastatic Lung Cancer  Met with patient and her husband, Louie Casa, prior to their visit with MD. Hanley Seamen patient "Your Patient Navigator" handout which explains my role, areas in which I am able to help, and all the contact information for myself and the office. Also gave patient MD and Navigator business card. Reviewed with patient the general overview of expected course after initial diagnosis and time frame for all steps to be completed.  New patient packet given to patient which includes: orientation to office and staff; campus directory; education on My Chart and Advance Directives; and patient centered education on lung cancer.  Patient lives with her husband. She has supportive children in the area. She doesn't work. Her and her family are going to the [employer] next week so all needs will be scheduled upon her return on June 19th. She reports that she is healing well from her femur fracture will minimal pain. She continues to use a walker for support.   Patient completed visit with Dr. Marin Olp.   She will need MRI of her brain and a consult with Radiation Oncology upon her return.  Patient understands all follow up procedures and expectations. They have my number to reach out for any further clarification or additional needs.    Oncology Nurse Navigator Documentation     01/14/2022    2:00 PM  Oncology Nurse Navigator Flowsheets  Navigator Location [facility]  Navigator Encounter Type Initial MedOnc  Patient Visit Type MedOnc  Treatment Phase Pre-Tx/Tx Discussion  Barriers/Navigation Needs Coordination of Care;Education  Education Pain/ Symptom Management;Preparing for Upcoming Surgery/ Treatment  Interventions Education;Psycho-Social Support  Acuity Level 2-Minimal Needs (1-2 Barriers Identified)  Education Method Verbal;Written  Support Groups/Services Friends and Family  Time Spent with Patient 30

## 2022-01-15 ENCOUNTER — Encounter: Payer: Self-pay | Admitting: *Deleted

## 2022-01-15 ENCOUNTER — Encounter: Payer: Self-pay | Admitting: Hematology & Oncology

## 2022-01-18 ENCOUNTER — Encounter: Payer: Self-pay | Admitting: Hematology & Oncology

## 2022-01-18 ENCOUNTER — Encounter: Payer: Self-pay | Admitting: *Deleted

## 2022-01-18 NOTE — Progress Notes (Signed)
Patient is scheduled for MRI on 01/25/22. Her consult with Radiation Oncology is scheduled for 01/31/2022.   Molecular studies are still pending.   Oncology Nurse Navigator Documentation     01/18/2022    1:00 PM  Oncology Nurse Navigator Flowsheets  Navigator Follow Up Date: 01/25/2022  Navigator Follow Up Reason: Scan Review  Navigator Location [facility]  Navigator Encounter Type Appt/Treatment Plan Review  Patient Visit Type MedOnc  Treatment Phase Pre-Tx/Tx Discussion  Barriers/Navigation Needs Coordination of Care;Education  Interventions None Required  Acuity Level 2-Minimal Needs (1-2 Barriers Identified)  Support Groups/Services Friends and Family  Time Spent with Patient 15

## 2022-01-20 ENCOUNTER — Other Ambulatory Visit: Payer: Self-pay | Admitting: *Deleted

## 2022-01-20 DIAGNOSIS — Z4802 Encounter for removal of sutures: Secondary | ICD-10-CM | POA: Insufficient documentation

## 2022-01-20 DIAGNOSIS — T753XXA Motion sickness, initial encounter: Secondary | ICD-10-CM | POA: Insufficient documentation

## 2022-01-20 DIAGNOSIS — E785 Hyperlipidemia, unspecified: Secondary | ICD-10-CM | POA: Insufficient documentation

## 2022-01-20 NOTE — Progress Notes (Signed)
The proposed treatment discussed in cancer conference is for discussion purpose only and is not a binding recommendation. The patient was not physically examined nor present for their treatment options. Therefore, final treatment plans cannot be decided.  ?

## 2022-01-24 DIAGNOSIS — G4733 Obstructive sleep apnea (adult) (pediatric): Secondary | ICD-10-CM | POA: Diagnosis not present

## 2022-01-24 DIAGNOSIS — I110 Hypertensive heart disease with heart failure: Secondary | ICD-10-CM | POA: Diagnosis not present

## 2022-01-24 DIAGNOSIS — E785 Hyperlipidemia, unspecified: Secondary | ICD-10-CM | POA: Diagnosis not present

## 2022-01-24 DIAGNOSIS — S72351D Displaced comminuted fracture of shaft of right femur, subsequent encounter for closed fracture with routine healing: Secondary | ICD-10-CM | POA: Diagnosis not present

## 2022-01-24 DIAGNOSIS — E119 Type 2 diabetes mellitus without complications: Secondary | ICD-10-CM | POA: Diagnosis not present

## 2022-01-24 DIAGNOSIS — I5032 Chronic diastolic (congestive) heart failure: Secondary | ICD-10-CM | POA: Diagnosis not present

## 2022-01-24 DIAGNOSIS — Z7982 Long term (current) use of aspirin: Secondary | ICD-10-CM | POA: Diagnosis not present

## 2022-01-24 DIAGNOSIS — Z7985 Long-term (current) use of injectable non-insulin antidiabetic drugs: Secondary | ICD-10-CM | POA: Diagnosis not present

## 2022-01-24 DIAGNOSIS — Z7902 Long term (current) use of antithrombotics/antiplatelets: Secondary | ICD-10-CM | POA: Diagnosis not present

## 2022-01-24 DIAGNOSIS — Z853 Personal history of malignant neoplasm of breast: Secondary | ICD-10-CM | POA: Diagnosis not present

## 2022-01-24 DIAGNOSIS — K219 Gastro-esophageal reflux disease without esophagitis: Secondary | ICD-10-CM | POA: Diagnosis not present

## 2022-01-24 DIAGNOSIS — W19XXXD Unspecified fall, subsequent encounter: Secondary | ICD-10-CM | POA: Diagnosis not present

## 2022-01-24 DIAGNOSIS — Z7901 Long term (current) use of anticoagulants: Secondary | ICD-10-CM | POA: Diagnosis not present

## 2022-01-24 DIAGNOSIS — Z8673 Personal history of transient ischemic attack (TIA), and cerebral infarction without residual deficits: Secondary | ICD-10-CM | POA: Diagnosis not present

## 2022-01-24 DIAGNOSIS — Z9989 Dependence on other enabling machines and devices: Secondary | ICD-10-CM | POA: Diagnosis not present

## 2022-01-25 ENCOUNTER — Ambulatory Visit (HOSPITAL_COMMUNITY)
Admission: RE | Admit: 2022-01-25 | Discharge: 2022-01-25 | Disposition: A | Discharge status: Home / Self Care | Payer: Medicare HMO | Source: Ambulatory Visit | Attending: Hematology & Oncology | Admitting: Hematology & Oncology

## 2022-01-25 DIAGNOSIS — C3490 Malignant neoplasm of unspecified part of unspecified bronchus or lung: Secondary | ICD-10-CM | POA: Diagnosis not present

## 2022-01-25 DIAGNOSIS — I6381 Other cerebral infarction due to occlusion or stenosis of small artery: Secondary | ICD-10-CM | POA: Diagnosis not present

## 2022-01-25 DIAGNOSIS — G4733 Obstructive sleep apnea (adult) (pediatric): Secondary | ICD-10-CM | POA: Diagnosis not present

## 2022-01-25 MED ORDER — GADOBUTROL 1 MMOL/ML IV SOLN
7.0000 mL | Freq: Once | INTRAVENOUS | Status: AC | PRN
  Administered 2022-01-25: 7 mL via INTRAVENOUS

## 2022-01-26 ENCOUNTER — Encounter: Payer: Self-pay | Admitting: *Deleted

## 2022-01-26 ENCOUNTER — Ambulatory Visit: Payer: Medicare HMO | Admitting: Physical Therapy

## 2022-01-26 DIAGNOSIS — S72301D Unspecified fracture of shaft of right femur, subsequent encounter for closed fracture with routine healing: Secondary | ICD-10-CM | POA: Diagnosis not present

## 2022-01-26 DIAGNOSIS — E785 Hyperlipidemia, unspecified: Secondary | ICD-10-CM | POA: Diagnosis not present

## 2022-01-26 DIAGNOSIS — Z8673 Personal history of transient ischemic attack (TIA), and cerebral infarction without residual deficits: Secondary | ICD-10-CM | POA: Diagnosis not present

## 2022-01-26 DIAGNOSIS — E119 Type 2 diabetes mellitus without complications: Secondary | ICD-10-CM | POA: Diagnosis not present

## 2022-01-26 DIAGNOSIS — W19XXXD Unspecified fall, subsequent encounter: Secondary | ICD-10-CM | POA: Diagnosis not present

## 2022-01-26 DIAGNOSIS — I110 Hypertensive heart disease with heart failure: Secondary | ICD-10-CM | POA: Diagnosis not present

## 2022-01-26 DIAGNOSIS — Z7985 Long-term (current) use of injectable non-insulin antidiabetic drugs: Secondary | ICD-10-CM | POA: Diagnosis not present

## 2022-01-26 DIAGNOSIS — I5032 Chronic diastolic (congestive) heart failure: Secondary | ICD-10-CM | POA: Diagnosis not present

## 2022-01-26 DIAGNOSIS — Z853 Personal history of malignant neoplasm of breast: Secondary | ICD-10-CM | POA: Diagnosis not present

## 2022-01-26 DIAGNOSIS — Z9989 Dependence on other enabling machines and devices: Secondary | ICD-10-CM | POA: Diagnosis not present

## 2022-01-26 DIAGNOSIS — G4733 Obstructive sleep apnea (adult) (pediatric): Secondary | ICD-10-CM | POA: Diagnosis not present

## 2022-01-26 DIAGNOSIS — Z7901 Long term (current) use of anticoagulants: Secondary | ICD-10-CM | POA: Diagnosis not present

## 2022-01-26 DIAGNOSIS — K219 Gastro-esophageal reflux disease without esophagitis: Secondary | ICD-10-CM | POA: Diagnosis not present

## 2022-01-26 DIAGNOSIS — Z7982 Long term (current) use of aspirin: Secondary | ICD-10-CM | POA: Diagnosis not present

## 2022-01-26 DIAGNOSIS — S72351D Displaced comminuted fracture of shaft of right femur, subsequent encounter for closed fracture with routine healing: Secondary | ICD-10-CM | POA: Diagnosis not present

## 2022-01-26 DIAGNOSIS — Z7902 Long term (current) use of antithrombotics/antiplatelets: Secondary | ICD-10-CM | POA: Diagnosis not present

## 2022-01-26 NOTE — Progress Notes (Signed)
Histology and Location of Primary Cancer: Stage IV adenocarcinoma of the left lung  Sites of Visceral and Bony Metastatic Disease: proximal right femur  Past/Anticipated chemotherapy by medical oncology, if any: chemotherapy with consideration for immunotherapy.  Pain on a scale of 0-10 is: 1-2 out of 10 in her right leg - muscle cramps   Ambulatory status? Walker? Wheelchair?: Ambulatory with Assistance - uses a cane for unfamiliar areas.  SAFETY ISSUES: Prior radiation? 12/30/14-01/27/15 left breast 50Gy Pacemaker/ICD? no Possible current pregnancy? no Is the patient on methotrexate? no  Current Complaints / other details:  Patient is here with her husband.  BP (!) 164/84 (BP Location: Left Arm, Patient Position: Sitting)   Pulse 65   Temp 98.2 F (36.8 C) (Temporal)   Resp 18   Ht 4\' 11"  (1.499 m)   Wt 161 lb 4 oz (73.1 kg)   SpO2 100%   BMI 32.57 kg/m

## 2022-01-27 ENCOUNTER — Encounter: Payer: Self-pay | Admitting: Hematology & Oncology

## 2022-01-27 ENCOUNTER — Inpatient Hospital Stay (HOSPITAL_BASED_OUTPATIENT_CLINIC_OR_DEPARTMENT_OTHER): Payer: Medicare HMO | Admitting: Hematology & Oncology

## 2022-01-27 ENCOUNTER — Encounter: Payer: Self-pay | Admitting: *Deleted

## 2022-01-27 ENCOUNTER — Inpatient Hospital Stay: Payer: Medicare HMO

## 2022-01-27 ENCOUNTER — Other Ambulatory Visit: Payer: Self-pay

## 2022-01-27 VITALS — BP 137/76 | HR 67 | Temp 98.0°F | Resp 18 | Ht 59.0 in | Wt 161.1 lb

## 2022-01-27 DIAGNOSIS — C50812 Malignant neoplasm of overlapping sites of left female breast: Secondary | ICD-10-CM

## 2022-01-27 DIAGNOSIS — Z170 Estrogen receptor positive status [ER+]: Secondary | ICD-10-CM

## 2022-01-27 DIAGNOSIS — C7989 Secondary malignant neoplasm of other specified sites: Secondary | ICD-10-CM | POA: Diagnosis not present

## 2022-01-27 DIAGNOSIS — C3432 Malignant neoplasm of lower lobe, left bronchus or lung: Secondary | ICD-10-CM | POA: Diagnosis not present

## 2022-01-27 DIAGNOSIS — Z7901 Long term (current) use of anticoagulants: Secondary | ICD-10-CM | POA: Diagnosis not present

## 2022-01-27 LAB — CBC WITH DIFFERENTIAL (CANCER CENTER ONLY)
Abs Immature Granulocytes: 0.02 10*3/uL (ref 0.00–0.07)
Basophils Absolute: 0 10*3/uL (ref 0.0–0.1)
Basophils Relative: 0 %
Eosinophils Absolute: 0.1 10*3/uL (ref 0.0–0.5)
Eosinophils Relative: 2 %
HCT: 34.7 % — ABNORMAL LOW (ref 36.0–46.0)
Hemoglobin: 11.5 g/dL — ABNORMAL LOW (ref 12.0–15.0)
Immature Granulocytes: 0 %
Lymphocytes Relative: 24 %
Lymphs Abs: 1.1 10*3/uL (ref 0.7–4.0)
MCH: 32.8 pg (ref 26.0–34.0)
MCHC: 33.1 g/dL (ref 30.0–36.0)
MCV: 98.9 fL (ref 80.0–100.0)
Monocytes Absolute: 0.5 10*3/uL (ref 0.1–1.0)
Monocytes Relative: 10 %
Neutro Abs: 2.9 10*3/uL (ref 1.7–7.7)
Neutrophils Relative %: 64 %
Platelet Count: 226 10*3/uL (ref 150–400)
RBC: 3.51 MIL/uL — ABNORMAL LOW (ref 3.87–5.11)
RDW: 12.5 % (ref 11.5–15.5)
WBC Count: 4.6 10*3/uL (ref 4.0–10.5)
nRBC: 0 % (ref 0.0–0.2)

## 2022-01-27 LAB — CMP (CANCER CENTER ONLY)
ALT: 15 U/L (ref 0–44)
AST: 15 U/L (ref 15–41)
Albumin: 4.4 g/dL (ref 3.5–5.0)
Alkaline Phosphatase: 159 U/L — ABNORMAL HIGH (ref 38–126)
Anion gap: 8 (ref 5–15)
BUN: 14 mg/dL (ref 8–23)
CO2: 30 mmol/L (ref 22–32)
Calcium: 10.3 mg/dL (ref 8.9–10.3)
Chloride: 102 mmol/L (ref 98–111)
Creatinine: 0.78 mg/dL (ref 0.44–1.00)
GFR, Estimated: >60 mL/min (ref >60)
Glucose, Bld: 129 mg/dL — ABNORMAL HIGH (ref 70–99)
Potassium: 3.7 mmol/L (ref 3.5–5.1)
Sodium: 140 mmol/L (ref 135–145)
Total Bilirubin: 0.5 mg/dL (ref 0.3–1.2)
Total Protein: 7 g/dL (ref 6.5–8.1)

## 2022-01-27 LAB — IRON AND IRON BINDING CAPACITY (CC-WL,HP ONLY)
Iron: 60 ug/dL (ref 28–170)
Saturation Ratios: 15 % (ref 10.4–31.8)
TIBC: 409 ug/dL (ref 250–450)
UIBC: 349 ug/dL (ref 148–442)

## 2022-01-27 LAB — FERRITIN: Ferritin: 59 ng/mL (ref 11–307)

## 2022-01-27 NOTE — Progress Notes (Signed)
Dr Marin Olp would still like to wait on molecular markers prior to initiation of systemic treatment. She is scheduled for a new patient consultation with RadOnc on Monday. We will wait for molecular analysis while the patient is receiving radiation.   Oncology Nurse Navigator Documentation     01/27/2022    1:30 PM  Oncology Nurse Navigator Flowsheets  Navigator Follow Up Date: 01/31/2022  Navigator Follow Up Reason: Review Note  Navigator Location [facility]  Navigator Encounter Type Follow-up Appt  Patient Visit Type MedOnc  Treatment Phase Pre-Tx/Tx Discussion  Barriers/Navigation Needs Coordination of Care;Education  Education Other  Interventions Psycho-Social Support  Acuity Level 2-Minimal Needs (1-2 Barriers Identified)  Support Groups/Services Friends and Family  Time Spent with Patient 15

## 2022-01-27 NOTE — Progress Notes (Signed)
Hematology and Oncology Follow Up Visit  Laura Bush [id number] [birth year] 71 y.o. 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 3 months     Interim History:  Laura Bush is back for follow-up.  This was her second office visit.  We first saw her back on 01/14/2022.  At that time, she had had surgery for a pathologic fracture of the right femur.  She had surgery on 01/04/2022.  She got through surgery quite nicely.  She had a bronchoscopy which showed a a adenocarcinoma in the left lung.  Unfortunately, we could not get molecular analysis on this.  We are trying to get the specimen sent off from her femur to test.  We did do a PET scan on her.  Surprise enough, the PET scan does not show any evidence of metastatic disease outside of the right femur.  She has some activity in the left lower lung.  She had a MRI of the brain which was negative for any metastatic disease.  Currently, she has oligometastatic stage IV disease.  I would think this should be a good prognosis for her.  She is doing physical therapy at home 3 times a week.  Her appetite is doing okay.  She is on Ozempic to try to lose weight.  She has had no change in bowel or bladder habits.  She has had no nausea or vomiting.  She has had no cough or shortness of breath.  There is no headache.  Currently, I would say performance status is probably ECOG 1.  Medications:  Current Outpatient Medications:    acetaminophen (TYLENOL) 500 MG tablet, Take 1 tablet (500 mg total) by mouth every 8 (eight) hours as needed for mild pain or moderate pain., Disp: 30 tablet, Rfl: 0   aMILoride (MIDAMOR) 5 MG tablet, Take 1 tablet (5 mg total) by mouth daily. (Patient taking differently: Take 5 mg by mouth in the morning.), Disp: 90 tablet,  Rfl: 3   apixaban (ELIQUIS) 2.5 MG TABS tablet, Take 1 tablet (2.5 mg total) by mouth 2 (two) times daily., Disp: 60 tablet, Rfl: 0   aspirin EC 81 MG tablet, Take 81 mg by mouth daily. Swallow whole., Disp: , Rfl:    B Complex-C (SUPER B COMPLEX PO), Take 1 tablet by mouth daily with breakfast., Disp: , Rfl:    Calcium Carb-Cholecalciferol (CALCIUM + D3 PO), Take 1 tablet by mouth daily., Disp: , Rfl:    carvedilol (COREG) 6.25 MG tablet, Take 6.25 mg by mouth 2 (two) times daily., Disp: , Rfl:    fexofenadine (ALLEGRA) 180 MG tablet, Take 180 mg by mouth in the morning., Disp: , Rfl:    fluticasone (CUTIVATE) 0.05 % cream, Apply 1 application. topically See admin instructions. Apply to dry area of the left thigh once a day, Disp: , Rfl:    HYDROcodone-acetaminophen (NORCO/VICODIN) 5-325 MG tablet, Take 1-2 tablets by mouth every 8 (eight) hours as needed for moderate pain or severe pain., Disp: 30 tablet, Rfl: 0   ipratropium (ATROVENT) 0.06 % nasal spray, Place 2 sprays into both nostrils 4 (four) times daily. Use as needed (Patient taking differently: Place 2 sprays into both nostrils 4 (four) times daily as needed for rhinitis.), Disp: 15 mL, Rfl: 4   methocarbamol (ROBAXIN) 500 MG tablet, Take 1 tablet (  500 mg total) by mouth every 6 (six) hours as needed for muscle spasms., Disp: 30 tablet, Rfl: 0   NIFEdipine (PROCARDIA-XL/NIFEDICAL-XL) 30 MG 24 hr tablet, Take 1 tablet (30 mg total) by mouth daily., Disp: 30 tablet, Rfl: 3   pantoprazole (PROTONIX) 40 MG tablet, Take 1 tablet (40 mg total) by mouth daily. (Patient taking differently: Take 40 mg by mouth daily before breakfast.), Disp: 90 tablet, Rfl: 3   Semaglutide, 2 MG/DOSE, (OZEMPIC, 2 MG/DOSE,) 8 MG/3ML SOPN, Inject 2 mg into the skin once a week. (Patient taking differently: Inject 2 mg into the skin every Saturday.), Disp: 9 mL, Rfl: 0   simvastatin (ZOCOR) 20 MG tablet, Take 1 tablet (20 mg total) by mouth daily. (Patient taking  differently: Take 20 mg by mouth every evening.), Disp: 90 tablet, Rfl: 3  Allergies:  Allergies  Allergen Reactions   Diclofenac Hypertension   Amlodipine Swelling and Other (See Comments)    Limbs swell, not the throat   Lanolin Other (See Comments)    Sneezing and watery eyes. Allergic to [facility].   Tape Other (See Comments)    Redness (Bandaids also)    Past Medical History, Surgical history, Social history, and Family History were reviewed and updated.  Review of Systems: Review of Systems  Constitutional: Negative.   HENT:  Negative.    Eyes: Negative.   Respiratory: Negative.    Cardiovascular: Negative.   Gastrointestinal: Negative.   Endocrine: Negative.   Genitourinary: Negative.    Musculoskeletal:  Positive for arthralgias.  Skin: Negative.   Neurological: Negative.   Hematological: Negative.   Psychiatric/Behavioral: Negative.      Physical Exam:  height is 4\' 11"  (1.499 m) and weight is 161 lb 1.9 oz (73.1 kg). Her oral temperature is 98 F (36.7 C). Her blood pressure is 137/76 and her pulse is 67. Her respiration is 18 and oxygen saturation is 100%.   Wt Readings from Last 3 Encounters:  01/27/22 161 lb 1.9 oz (73.1 kg)  01/14/22 164 lb (74.4 kg)  01/12/22 168 lb (76.2 kg)    Physical Exam Vitals reviewed.  HENT:     Head: Normocephalic and atraumatic.  Eyes:     Pupils: Pupils are equal, round, and reactive to light.  Cardiovascular:     Rate and Rhythm: Normal rate and regular rhythm.     Heart sounds: Normal heart sounds.  Pulmonary:     Effort: Pulmonary effort is normal.     Breath sounds: Normal breath sounds.  Abdominal:     General: Bowel sounds are normal.     Palpations: Abdomen is soft.  Musculoskeletal:        General: No tenderness or deformity. Normal range of motion.     Cervical back: Normal range of motion.     Comments: Extremities shows the healing surgical scar in the right thigh.  Lymphadenopathy:     Cervical: No  cervical adenopathy.  Skin:    General: Skin is warm and dry.     Findings: No erythema or rash.  Neurological:     Mental Status: She is alert and oriented to person, place, and time.  Psychiatric:        Behavior: Behavior normal.        Thought Content: Thought content normal.        Judgment: Judgment normal.      Lab Results  Component Value Date   WBC 4.6 01/27/2022   HGB 11.5 (L) 01/27/2022  HCT 34.7 (L) 01/27/2022   MCV 98.9 01/27/2022   PLT 226 01/27/2022     Chemistry      Component Value Date/Time   NA 140 01/27/2022 1252   NA 142 05/29/2017 1002   K 3.7 01/27/2022 1252   K 3.9 05/29/2017 1002   CL 102 01/27/2022 1252   CO2 30 01/27/2022 1252   CO2 28 05/29/2017 1002   BUN 14 01/27/2022 1252   BUN 13.5 05/29/2017 1002   CREATININE 0.78 01/27/2022 1252   CREATININE 0.81 04/06/2020 1059   CREATININE 0.8 05/29/2017 1002      Component Value Date/Time   CALCIUM 10.3 01/27/2022 1252   CALCIUM 9.4 05/29/2017 1002   ALKPHOS 159 (H) 01/27/2022 1252   ALKPHOS 39 (L) 05/29/2017 1002   AST 15 01/27/2022 1252   AST 18 05/29/2017 1002   ALT 15 01/27/2022 1252   ALT 19 05/29/2017 1002   BILITOT 0.5 01/27/2022 1252   BILITOT 0.48 05/29/2017 1002       Impression and Plan: Laura Bush is a very charming 71 year old white female.  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  This was actually a small lesion.  I still think that we are going to need systemic therapy on her.  She must have micrometastatic disease since her tumor started in the left lung and traveled to the right femur.  The molecular analysis will be critical.  This will decide what type of therapy we use.  If she has a molecular mutation, then we could utilize targeted therapy.  If not, we would go with chemotherapy with immunotherapy.  Given that she has a oligometastatic disease, we probably do not have to give a full course of chemotherapy.  I would think 4 cycles of  chemotherapy with immunotherapy would be appropriate and then use maintenance immunotherapy.  I think that if the molecular analysis from her right thigh is nondiagnostic, then we may have to consider doing a liquid biopsy from her blood.  She is in great shape.  She has great support.  She and her husband got back from the [employer].  Had a wonderful time there with her family.  I will plan to get her back once we get the molecular analysis back.  In the meantime, she will have radiation therapy for the right femur.  She sees Dr. Sondra Come on Monday.  I have to believe that the outcome should be quite good given the fact that we just do not have a lot of tumor bulk.   Volanda Napoleon, MD 6/22/20232:10 PM

## 2022-01-27 NOTE — Progress Notes (Signed)
Received notification from [facility] that patient's bronch tissue was insufficient for testing. They have requested the alternate bone biopsy for testing.   Reviewed patient's MRI with Dr Marin Olp. He is also aware of delay to [place]. He would like to have the patient come in to discuss treatment options.   Called and scheduled patient to come in today and speak with Dr Marin Olp. She is aware of date, time and location.   Oncology Nurse Navigator Documentation     01/27/2022    9:30 AM  Oncology Nurse Navigator Flowsheets  Navigator Follow Up Date: 01/27/2022  Navigator Follow Up Reason: Follow-up Appointment  Navigator Location [facility]  Navigator Encounter Type Scan Review;Pathology Review;Appt/Treatment Plan Review  Telephone Appt Confirmation/Clarification;Outgoing Call  Patient Visit Type MedOnc  Treatment Phase Pre-Tx/Tx Discussion  Barriers/Navigation Needs Coordination of Care;Education  Education Other  Interventions Coordination of Care;Education  Acuity Level 2-Minimal Needs (1-2 Barriers Identified)  Coordination of Care Appts  Education Method Verbal  Support Groups/Services Friends and Family  Time Spent with Patient 14

## 2022-01-28 ENCOUNTER — Ambulatory Visit: Payer: Medicare HMO | Admitting: Physical Therapy

## 2022-01-28 DIAGNOSIS — Z8673 Personal history of transient ischemic attack (TIA), and cerebral infarction without residual deficits: Secondary | ICD-10-CM | POA: Diagnosis not present

## 2022-01-28 DIAGNOSIS — Z7901 Long term (current) use of anticoagulants: Secondary | ICD-10-CM | POA: Diagnosis not present

## 2022-01-28 DIAGNOSIS — K219 Gastro-esophageal reflux disease without esophagitis: Secondary | ICD-10-CM | POA: Diagnosis not present

## 2022-01-28 DIAGNOSIS — E119 Type 2 diabetes mellitus without complications: Secondary | ICD-10-CM | POA: Diagnosis not present

## 2022-01-28 DIAGNOSIS — Z7982 Long term (current) use of aspirin: Secondary | ICD-10-CM | POA: Diagnosis not present

## 2022-01-28 DIAGNOSIS — Z853 Personal history of malignant neoplasm of breast: Secondary | ICD-10-CM | POA: Diagnosis not present

## 2022-01-28 DIAGNOSIS — S72351D Displaced comminuted fracture of shaft of right femur, subsequent encounter for closed fracture with routine healing: Secondary | ICD-10-CM | POA: Diagnosis not present

## 2022-01-28 DIAGNOSIS — I5032 Chronic diastolic (congestive) heart failure: Secondary | ICD-10-CM | POA: Diagnosis not present

## 2022-01-28 DIAGNOSIS — W19XXXD Unspecified fall, subsequent encounter: Secondary | ICD-10-CM | POA: Diagnosis not present

## 2022-01-28 DIAGNOSIS — Z9989 Dependence on other enabling machines and devices: Secondary | ICD-10-CM | POA: Diagnosis not present

## 2022-01-28 DIAGNOSIS — Z7985 Long-term (current) use of injectable non-insulin antidiabetic drugs: Secondary | ICD-10-CM | POA: Diagnosis not present

## 2022-01-28 DIAGNOSIS — G4733 Obstructive sleep apnea (adult) (pediatric): Secondary | ICD-10-CM | POA: Diagnosis not present

## 2022-01-28 DIAGNOSIS — I110 Hypertensive heart disease with heart failure: Secondary | ICD-10-CM | POA: Diagnosis not present

## 2022-01-28 DIAGNOSIS — Z7902 Long term (current) use of antithrombotics/antiplatelets: Secondary | ICD-10-CM | POA: Diagnosis not present

## 2022-01-28 DIAGNOSIS — E785 Hyperlipidemia, unspecified: Secondary | ICD-10-CM | POA: Diagnosis not present

## 2022-01-30 NOTE — Progress Notes (Signed)
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 A diagnosis of Malignant 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, estrogen receptor positive (HCC) was also pertinent to this visit.  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 female who is accompanied by her husband. she is seen as a courtesy of Dr. Myna Hidalgo for an opinion concerning radiation therapy as part of management for her recently diagnosed left lung cancer with osseous metastasis to the right femur.  The patient presented to the ED on 01/03/22 following a fall resulting in a fracture to her right femur. CT scan of the chest abdomen and pelvis performed for evaluation on that same date incidentally revealed a mass in the left lower lung measuring 1.7 x 1.2 cm suggestive of a bronchogenic carcinoma. No mediastinal or hilar adenopathy was appreciated. Femoral CT showed the acute displaced fracture of the mid femur with a lytic bone lesion/soft tissue calcifications. CT of the head also performed showed no intracranial abnormalities.   Reaming of the right femur collected on 01/04/22  revealed metastatic adenocarcinoma to bone, consistent with lung cancer primary.   Bronchoscopy with biopsies of the left lower lobe, superior segment of the  lung performed on 01/06/22 revealed malignant cells present, consistent with adenocarcinoma.  Accordingly, the patient was referred to Dr. Myna Hidalgo on 01/14/22 and 01/27/22 to discuss treatment options. Following review of  treatment options, the patient expressed agreement in proceeding with 4 cycles of chemotherapy with immunotherapy (pending further molecular studies). Additionally, Dr. Myna Hidalgo referred the patient to myself for consideration of radiation therapy to the right femur. Once molecular stufy results come back, the patient will return to Dr. Myna Hidalgo to further discuss systemic treatment options.    Pertinent imaging thus far includes: -- PET scan on 01/14/22 which showed the LLL nodule as hypermetabolic and a second smaller nodule in the LLL too small for PET resolution. PET also showed a patchy hypermetabolism in the soft tissues surrounding the proximal right femur, compatible with recent pathologic fracture, s/p ORIF performed on 01/04/2022., and small postprocedural pneumatocele in the left lower lobe. -- MRI of the brain on 01/25/22 showed no evidence of intracranial metastatic disease.   Of note: prior to her femoral fracture, the patient endorsed right leg pain/swelling for several weeks. LE doppler study performed on 12/14/21 showed no evidence of DVT in the right LE.   PREVIOUS RADIATION THERAPY: Yes  Stage IA TIb, N0, Left breast, IDC/DCIS; ER/PR+; Her2- diagnosed in 2016: s/p lumpectomy, XRT, antiestrogens   Dr. Mitzi Hansen Diagnosis:   Left-sided breast cancer    Indication for treatment:  Curative      Radiation treatment dates:   12/30/2014 through 01/27/2015 Site/dose:   The patient initially received a dose of 42.5 Gy in 17 fractions to the breast using whole-breast tangent fields. This was delivered using a 3-D conformal technique. The patient then received a boost to the lumpectomy cavity. This delivered an additional 7.5 Gy in 3 fractions using a 3 field photon technique due to the depth of the lumpectomy cavity.  The total dose was 50 Gy.    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 (HCC) 01/14/2022   Obesity (BMI 30-39.9) 11/11/2020   OSA on CPAP    Personal history of radiation therapy    Pneumonia 2000's X 1   PONV (postoperative nausea and vomiting)    Rosacea    S/P radiation therapy 12/30/14-01/27/15   left breast 50Gy total dose   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"   Swallowing difficulty    Venous insufficiency     PAST SURGICAL HISTORY: Past Surgical History:  Procedure Laterality Date   ABDOMINAL HYSTERECTOMY  1999   APPENDECTOMY  ~ 2006   BONE BIOPSY Right 01/04/2022   Procedure: RIGHT FEMORAL BONE BIOPSY;  Surgeon: Myrene Galas, MD;  Location: [facility] OR;  Service: Orthopedics;  Laterality: Right;   BREAST BIOPSY Left 10/2014   BREAST LUMPECTOMY Left 2016   BREAST REDUCTION SURGERY Bilateral 11/11/2014   Procedure: Bilateral Breast Reduction;  Surgeon: Etter Sjogren, MD;  Location: [facility] OR;  Service: Plastics;  Laterality: Bilateral;   BRONCHIAL BIOPSY  01/06/2022   Procedure: BRONCHIAL BIOPSIES;  Surgeon: Leslye Peer, MD;  Location: [facility] ENDOSCOPY;  Service: Pulmonary;;   BRONCHIAL BRUSHINGS  01/06/2022   Procedure: BRONCHIAL BRUSHINGS;  Surgeon: Leslye Peer, MD;  Location: [facility] ENDOSCOPY;  Service: Pulmonary;;   BRONCHIAL NEEDLE ASPIRATION BIOPSY  01/06/2022   Procedure: BRONCHIAL NEEDLE ASPIRATION BIOPSIES;  Surgeon: Leslye Peer, MD;  Location: [facility] ENDOSCOPY;  Service: Pulmonary;;   BRONCHIAL WASHINGS  01/06/2022   Procedure: BRONCHIAL WASHINGS;  Surgeon: Leslye Peer, MD;  Location: [facility] ENDOSCOPY;   Service: Pulmonary;;   CARPAL TUNNEL RELEASE Bilateral    2 surgeries on right, 1 on left   CESAREAN SECTION  1978; 1980   COLONOSCOPY     FEMUR IM NAIL Right 01/04/2022   Procedure: RIGHT FEMORAL INTRAMEDULLARY (IM) NAIL;  Surgeon: Myrene Galas, MD;  Location: [facility] OR;  Service: Orthopedics;  Laterality: Right;   FIDUCIAL MARKER PLACEMENT  01/06/2022   Procedure: FIDUCIAL MARKER PLACEMENT;  Surgeon: Leslye Peer, MD;  Location: [facility] ENDOSCOPY;  Service: Pulmonary;;   FOREARM FRACTURE SURGERY Right 2003 X 3   KNEE ARTHROSCOPY Bilateral    meniscus repair   PARTIAL MASTECTOMY WITH NEEDLE LOCALIZATION AND AXILLARY SENTINEL LYMPH NODE BX Left 11/11/2014   Procedure: LEFT BREAST PARTIAL MASTECTOMY WITH NEEDLE LOCALIZATION TIMES TWO AND LEFT AXILLARY SENTINEL LYMPH NODE Biopsy;  Surgeon: Claud Kelp, MD;  Location: [facility] OR;  Service: General;  Laterality: Left;   SQUAMOUS CELL CARCINOMA EXCISION  1980's X 1   "nose"   TONSILLECTOMY  ~ 1957   TUBAL LIGATION  ?1984    FAMILY HISTORY:  Family History  Problem Relation Age of Onset   Dementia Mother  Lives in [place]   Hypertension Mother    Hyperlipidemia Mother    Stroke Father        deceased   Heart failure Father    Hypertension Father    Uterine cancer Sister 68   Heart attack Maternal Grandmother    Hypertension Maternal Grandfather    Stroke Paternal Grandfather    Colon cancer Neg Hx    Esophageal cancer Neg Hx    Pancreatic cancer Neg Hx    Stomach cancer Neg Hx    Liver disease Neg Hx    Sleep apnea Neg Hx     SOCIAL HISTORY:  Social History   Tobacco Use   Smoking status: Never   Smokeless tobacco: Never  Vaping Use   Vaping Use: Never used  Substance Use Topics   Alcohol use: Yes    Alcohol/week: 2.0 standard drinks of alcohol    Types: 2 Glasses of wine per week    Comment: 2 glasses a week   Drug use: No    ALLERGIES:  Allergies  Allergen Reactions   Diclofenac Hypertension   Amlodipine  Swelling and Other (See Comments)    Limbs swell, not the throat   Lanolin Other (See Comments)    Sneezing and watery eyes. Allergic to [facility].   Tape Other (See Comments)    Redness (Bandaids also)    MEDICATIONS:  Current Outpatient Medications  Medication Sig Dispense Refill   acetaminophen (TYLENOL) 500 MG tablet Take 1 tablet (500 mg total) by mouth every 8 (eight) hours as needed for mild pain or moderate pain. 30 tablet 0   aMILoride (MIDAMOR) 5 MG tablet Take 1 tablet (5 mg total) by mouth daily. (Patient taking differently: Take 5 mg by mouth in the morning.) 90 tablet 3   apixaban (ELIQUIS) 2.5 MG TABS tablet Take 1 tablet (2.5 mg total) by mouth 2 (two) times daily. 60 tablet 0   aspirin EC 81 MG tablet Take 81 mg by mouth daily. Swallow whole.     B Complex-C (SUPER B COMPLEX PO) Take 1 tablet by mouth daily with breakfast.     Calcium Carb-Cholecalciferol (CALCIUM + D3 PO) Take 1 tablet by mouth daily.     fexofenadine (ALLEGRA) 180 MG tablet Take 180 mg by mouth in the morning.     fluticasone (CUTIVATE) 0.05 % cream Apply 1 application. topically See admin instructions. Apply to dry area of the left thigh once a day     ipratropium (ATROVENT) 0.06 % nasal spray Place 2 sprays into both nostrils 4 (four) times daily. Use as needed (Patient taking differently: Place 2 sprays into both nostrils 4 (four) times daily as needed for rhinitis.) 15 mL 4   methocarbamol (ROBAXIN) 500 MG tablet Take 1 tablet (500 mg total) by mouth every 6 (six) hours as needed for muscle spasms. 30 tablet 0   NIFEdipine (PROCARDIA-XL/NIFEDICAL-XL) 30 MG 24 hr tablet Take 1 tablet (30 mg total) by mouth daily. 30 tablet 3   pantoprazole (PROTONIX) 40 MG tablet Take 1 tablet (40 mg total) by mouth daily. (Patient taking differently: Take 40 mg by mouth daily before breakfast.) 90 tablet 3   Semaglutide, 2 MG/DOSE, (OZEMPIC, 2 MG/DOSE,) 8 MG/3ML SOPN Inject 2 mg into the skin once a week. (Patient taking  differently: Inject 2 mg into the skin every Saturday.) 9 mL 0   simvastatin (ZOCOR) 20 MG tablet Take 1 tablet (20 mg total) by mouth daily. (Patient taking differently: Take 20 mg  by mouth every evening.) 90 tablet 3   carvedilol (COREG) 6.25 MG tablet Take 6.25 mg by mouth 2 (two) times daily.     HYDROcodone-acetaminophen (NORCO/VICODIN) 5-325 MG tablet Take 1-2 tablets by mouth every 8 (eight) hours as needed for moderate pain or severe pain. (Patient not taking: Reported on 01/31/2022) 30 tablet 0   No current facility-administered medications for this encounter.    REVIEW OF SYSTEMS:  A 10+ POINT REVIEW OF SYSTEMS WAS OBTAINED including neurology, dermatology, psychiatry, cardiac, respiratory, lymph, extremities, GI, GU, musculoskeletal, constitutional, reproductive, HEENT.  She denies any pain within the chest area significant cough or hemoptysis.  Her pain in the right leg is much improved after undergoing surgery.  She continues on physical therapy 3 times a week for this issue.  She is ambulating with the assistance of a cane at this time.   PHYSICAL EXAM:  height is 4\' 11"  (1.499 m) and weight is 161 lb 4 oz (73.1 kg). Her temporal temperature is 98.2 F (36.8 C). Her blood pressure is 164/84 (abnormal) and her pulse is 65. Her respiration is 18 and oxygen saturation is 100%.   General: Alert and oriented, in no acute distress HEENT: Head is normocephalic. Extraocular movements are intact. Oropharynx is clear. Neck: Neck is supple, no palpable cervical or supraclavicular lymphadenopathy. Heart: Regular in rate and rhythm with no murmurs, rubs, or gallops. Chest: Clear to auscultation bilaterally, with no rhonchi, wheezes, or rales.  Tattoos in place from her previous left breast radiation therapy Abdomen: Soft, nontender, nondistended, with no rigidity or guarding. Extremities: No cyanosis or edema. Lymphatics: see Neck Exam Skin: No concerning lesions. Musculoskeletal: symmetric  strength and muscle tone throughout. Neurologic: Cranial nerves II through XII are grossly intact. No obvious focalities. Speech is fluent. Coordination is intact. Psychiatric: Judgment and insight are intact. Affect is appropriate.   ECOG = 1  0 - Asymptomatic (Fully active, able to carry on all predisease activities without restriction)  1 - Symptomatic but completely ambulatory (Restricted in physically strenuous activity but ambulatory and able to carry out work of a light or sedentary nature. For example, light housework, office work)  2 - Symptomatic, <50% in bed during the day (Ambulatory and capable of all self care but unable to carry out any work activities. Up and about more than 50% of waking hours)  3 - Symptomatic, >50% in bed, but not bedbound (Capable of only limited self-care, confined to bed or chair 50% or more of waking hours)  4 - Bedbound (Completely disabled. Cannot carry on any self-care. Totally confined to bed or chair)  5 - Death   Santiago Glad MM, Creech RH, Tormey DC, et al. [phone]). "Toxicity and response criteria of the [facility]". Am. Evlyn Clines. Oncol. 5 (6): 649-55  LABORATORY DATA:  Lab Results  Component Value Date   WBC 4.6 01/27/2022   HGB 11.5 (L) 01/27/2022   HCT 34.7 (L) 01/27/2022   MCV 98.9 01/27/2022   PLT 226 01/27/2022   NEUTROABS 2.9 01/27/2022   Lab Results  Component Value Date   NA 140 01/27/2022   K 3.7 01/27/2022   CL 102 01/27/2022   CO2 30 01/27/2022   GLUCOSE 129 (H) 01/27/2022   BUN 14 01/27/2022   CREATININE 0.78 01/27/2022   CALCIUM 10.3 01/27/2022      RADIOGRAPHY: MR Brain W Wo Contrast  Result Date: 01/26/2022 CLINICAL DATA:  Non-small cell lung cancer, staging EXAM: MRI HEAD WITHOUT AND WITH CONTRAST  TECHNIQUE: Multiplanar, multiecho pulse sequences of the brain and surrounding structures were obtained without and with intravenous contrast. CONTRAST:  7mL GADAVIST GADOBUTROL 1 MMOL/ML IV SOLN  COMPARISON:  03/22/2021 FINDINGS: Brain: No restricted diffusion to suggest acute or subacute infarct. No acute hemorrhage, mass, mass effect, or midline shift. No abnormal parenchymal or meningeal enhancement. No hydrocephalus or extra-axial collection. No hemosiderin deposition to suggest remote hemorrhage. Scattered T2 hyperintense signal in the periventricular white matter mild, likely the sequela of chronic small vessel ischemic disease. Lacunar infarct in the right thalamus. Vascular: Normal arterial flow voids. Normal arterial and venous enhancement. Skull and upper cervical spine: Normal marrow signal. No abnormal enhancement. Sinuses/Orbits: Mild mucosal thickening in the ethmoid air cells. The orbits are unremarkable. Other: The mastoids are well aerated. IMPRESSION: No acute intracranial process. No evidence of metastatic disease in the brain. Electronically Signed   By: Wiliam Ke M.D.   On: 01/26/2022 20:13   NM PET Image Initial (PI) Skull Base To Thigh  Result Date: 01/17/2022 CLINICAL DATA:  Subsequent treatment strategy for lung cancer, history of left breast cancer. EXAM: NUCLEAR MEDICINE PET SKULL BASE TO THIGH TECHNIQUE: 8.1 mCi F-18 FDG was injected intravenously. Full-ring PET imaging was performed from the skull base to thigh after the radiotracer. CT data was obtained and used for attenuation correction and anatomic localization. Fasting blood glucose: 99 mg/dl COMPARISON:  CT chest abdomen pelvis 01/03/2022. FINDINGS: Mediastinal blood pool activity: SUV max 2.2 Liver activity: SUV max NA NECK: No abnormal hypermetabolism. Incidental CT findings: None. CHEST: No hypermetabolic mediastinal hilar or axillary lymph nodes. Hypermetabolic 1.3 x 1.7 cm nodule in the posterior left lower lobe (7/39), SUV max 5.5. Adjacent fiducial marker and new small air-fluid collection measuring 1.5 x 2.0 cm (7/42), likely a postprocedural pneumatocele. 9 mm posterior left lower lobe nodule (7/49), too  small for PET resolution. Incidental CT findings: Atherosclerotic calcification of the aorta, aortic valve and coronary arteries. Heart is enlarged. No pericardial or pleural effusion. Surgical clips along the left chest wall. Mild subpleural radiation scarring in the anterior right lung. ABDOMEN/PELVIS: No abnormal hypermetabolism in the liver, adrenal glands, spleen or pancreas. No hypermetabolic lymph nodes. Incidental CT findings: Liver is unremarkable. Stones in the gallbladder. Adrenal glands, kidneys, spleen, pancreas, stomach and bowel are grossly unremarkable. Atherosclerotic calcification of the aorta. SKELETON: Patchy hypermetabolism in the soft tissues surrounding the proximal right femur, compatible with a pathologic fracture, s/p ORIF 01/04/2022. No additional abnormal osseous hypermetabolism. Incidental CT findings: Degenerative changes in the spine. IMPRESSION: 1. Hypermetabolic left lower lobe nodule. A second smaller nodule in the left lower lobe is too small for PET resolution. Interval ORIF of a pathologic right femur fracture with pathology indicating metastatic adenocarcinoma from a lung primary. Findings are consistent with stage IV disease. 2. Small postprocedural pneumatocele in the left lower lobe. 3. Cholelithiasis. 4. Aortic atherosclerosis (ICD10-I70.0). Coronary artery calcification. Electronically Signed   By: Leanna Battles M.D.   On: 01/17/2022 08:49   DG Chest Port 1 View  Result Date: 01/06/2022 CLINICAL DATA:  Status post bronchoscopy. EXAM: PORTABLE CHEST 1 VIEW COMPARISON:  [name] 03, 2022. FINDINGS: Stable cardiomediastinal silhouette. Minimal right midlung subsegmental atelectasis is noted. Left lung is clear. No pneumothorax is noted. The visualized skeletal structures are unremarkable. IMPRESSION: Minimal right midlung subsegmental atelectasis. Electronically Signed   By: Lupita Raider M.D.   On: 01/06/2022 10:15   DG C-ARM BRONCHOSCOPY  Result Date: 01/06/2022 C-ARM  BRONCHOSCOPY: Fluoroscopy was utilized  by the requesting physician.  No radiographic interpretation.   DG C-Arm 1-60 Min-No Report  Result Date: 01/06/2022 Fluoroscopy was utilized by the requesting physician.  No radiographic interpretation.   DG FEMUR PORT, MIN 2 VIEWS RIGHT  Result Date: 01/04/2022 CLINICAL DATA:  Right femur ORIF EXAM: RIGHT FEMUR PORTABLE 2 VIEW COMPARISON:  01/03/2022 FINDINGS: Status post ORIF of mid left femoral diaphyseal fracture. Lytic changes with periosteal reaction along the lateral cortex at the fracture site suggestive of a pathologic fracture. Fracture alignment is anatomic. No additional fractures. No additional bone lesion is seen. IMPRESSION: Status post ORIF of mid left femoral diaphyseal fracture, in anatomic alignment. Lytic changes with periosteal reaction along the lateral cortex at the fracture site suggestive of a pathologic fracture. Electronically Signed   By: Duanne Guess D.O.   On: 01/04/2022 13:03   DG FEMUR, MIN 2 VIEWS RIGHT  Result Date: 01/04/2022 CLINICAL DATA:  Right femoral ORIF EXAM: RIGHT FEMUR 2 VIEWS COMPARISON:  X-ray 01/03/2022 FINDINGS: Seven C-arm fluoroscopic images were obtained intraoperatively and submitted for post operative interpretation. Images demonstrate long intramedullary rod with proximal and distal interlocking screw fixation traversing mid right femoral diaphyseal fracture. 1 minute 26 seconds fluoroscopy time utilized. Radiation dose: 12.8 mGy. Please see the performing provider's procedural report for further detail. IMPRESSION: Intraoperative right femur ORIF. Electronically Signed   By: Duanne Guess D.O.   On: 01/04/2022 11:09   DG C-Arm 1-60 Min-No Report  Result Date: 01/04/2022 Fluoroscopy was utilized by the requesting physician.  No radiographic interpretation.   DG C-Arm 1-60 Min-No Report  Result Date: 01/04/2022 Fluoroscopy was utilized by the requesting physician.  No radiographic interpretation.    CT Head Wo Contrast  Result Date: 01/03/2022 CLINICAL DATA:  Lower extremity injury. Fell. Also musculoskeletal neoplasm for staging. There is a 2016 history of partial mastectomy for carcinoma left breast EXAM: CT HEAD WITHOUT CONTRAST CT CHEST, ABDOMEN AND PELVIS WITH CONTRAST TECHNIQUE: Contiguous axial images were obtained from the base of the skull through the vertex without intravenous contrast. Multidetector CT imaging of the chest, abdomen and pelvis was performed during intravenous contrast administration. RADIATION DOSE REDUCTION: This exam was performed according to the departmental dose-optimization program which includes automated exposure control, adjustment of the mA and/or kV according to patient size and/or use of iterative reconstruction technique. CONTRAST:  OMNIPAQUE IOHEXOL 300 MG/ML  SOLN COMPARISON:  No prior body CT for comparison. Only relevant previous study is portable chest x-ray earlier today. FINDINGS: CT HEAD FINDINGS Brain: No evidence of acute infarction, hemorrhage, hydrocephalus, extra-axial collection or mass lesion/mass effect. There is mild cerebrocerebellar atrophy. Vascular: There are scattered calcifications of the carotid siphons but no hyperdense central vasculature. Skull: No fracture or focal bone lesion is seen. Sinuses/Orbits: No acute findings. No mastoid or middle ear effusion. There is membrane thickening in the posterior ethmoid air cells, right sphenoid air cell without fluid levels. Other: None. CT CHEST FINDINGS Cardiovascular: Cardiac size is normal. There are calcifications in the proximal LAD coronary artery. There is no pericardial effusion. There is mild aortic atherosclerosis without aneurysm or dissection. There are mild calcifications and thickening in the aortic valve but no aortic root dilatation. Pulmonary arteries are normal caliber and centrally clear, pulmonary veins are decompressed. Mediastinum/Nodes: The lower poles of the thyroid  gland are unremarkable. Axillary spaces are clear. No intrathoracic adenopathy is seen. There is a small hiatal hernia. Thoracic esophagus unremarkable aside from small amounts of scattered retained fluid. There is  no tracheal filling defect. Lungs/Pleura: There is a lobular irregular 1.7 x 1.2 cm mass in the left lower lobe posteriorly in the superior segment. There is pleural stranding posterior to this with small surrounding spiculations. There is a second, ovoid to rounded nodule inferior to this in the posterior basal segment of the left lower lobe measuring 1.0 x 0.8 cm. Findings are worrisome for primary bronchogenic neoplasm with a small intraorgan synchronous primary or intraorgan metastatic lesion versus 2 separate metastases. There is subpleural reticulation in the anterior aspect of the left upper lobe which is probably related to left breast XRT. The remainder of both lungs are clear. There is no pleural effusion, thickening or pneumothorax. Musculoskeletal: No chest wall mass or suspicious bone lesions identified. There is thoracic spondylosis. There are postsurgical changes in the upper half of the left breast, small foci of fat necrosis and oil cysts anteriorly. CT ABDOMEN AND PELVIS FINDINGS Hepatobiliary: There is a 4 mm too small to characterize hypodensity in the dome of the left lobe of the liver. There is a 1.3 cm cyst of 11.9 Hounsfield units in the left lobe medial segment. The remaining liver is unremarkable. There are 2 small stones in the proximal gallbladder but no wall thickening or biliary dilatation. Pancreas: No mass enhancement or ductal dilatation is seen. Spleen: No mass enhancement or splenomegaly. Adrenals/Urinary Tract: No adrenal mass or renal cortical mass enhancement. There is no urinary stone or obstruction. There is no bladder thickening. Stomach/Bowel: There is mild gastric fluid distention without wall thickening. The unopacified small bowel is unremarkable. An appendix  is not seen in this patient. There is moderate stool retention ascending and proximal transverse colon. Left colonic diverticula without evidence of colitis or diverticulitis. Vascular/Lymphatic: There are mild calcific plaques in the infrarenal aorta, common iliac arteries. No aneurysm or stenosis is seen. No adenopathy seen in the abdomen or the pelvis. Reproductive: No mass or other significant abnormality. Status post hysterectomy. Musculoskeletal: There is prominent facet hypertrophy in the lower lumbar spine most significant at L4-5 and L5-S1, with spurring and osteophytes of the pubic symphysis. No destructive bone lesion is seen. Other: There is no incarcerated hernia. There is no free air, hemorrhage or fluid. IMPRESSION: 1. Irregular 1.7 x 1.2 cm left lower lobe mass, with 1.0 x 0.8 cm nodule inferior to it. The gross appearance is more suggestive of a bronchogenic neoplasm with intralobe synchronous primary or metastasis, but both could be metastatic from an extrathoracic primary in this patient with history of left breast cancer. PET-CT is recommended and tissue sampling will almost certainly be needed. 2. Coronary artery and aortic atherosclerosis. 3. Small hiatal hernia, scattered retained fluid in the otherwise unremarkable esophagus. 4. Subpleural reticulation anteriorly in the left upper lobe most likely due to left breast XRT with evidence of partial left mastectomy. 5. 4 mm too small to characterize hypodensity in the hepatic dome and a small cyst in the left lobe medial segment. 6. No adenopathy or mass enhancement in the abdomen or pelvis. 7. Constipation and diverticulosis. 8. Degenerative changes of the spine without destructive bone lesions. 9. No acute intracranial CT findings or appreciable mass. Mild global atrophy. Electronically Signed   By: Almira Bar M.D.   On: 01/03/2022 23:42   CT CHEST ABDOMEN PELVIS W CONTRAST  Result Date: 01/03/2022 CLINICAL DATA:  Lower extremity injury.  Fell. Also musculoskeletal neoplasm for staging. There is a 2016 history of partial mastectomy for carcinoma left breast EXAM: CT HEAD WITHOUT  CONTRAST CT CHEST, ABDOMEN AND PELVIS WITH CONTRAST TECHNIQUE: Contiguous axial images were obtained from the base of the skull through the vertex without intravenous contrast. Multidetector CT imaging of the chest, abdomen and pelvis was performed during intravenous contrast administration. RADIATION DOSE REDUCTION: This exam was performed according to the departmental dose-optimization program which includes automated exposure control, adjustment of the mA and/or kV according to patient size and/or use of iterative reconstruction technique. CONTRAST:  OMNIPAQUE IOHEXOL 300 MG/ML  SOLN COMPARISON:  No prior body CT for comparison. Only relevant previous study is portable chest x-ray earlier today. FINDINGS: CT HEAD FINDINGS Brain: No evidence of acute infarction, hemorrhage, hydrocephalus, extra-axial collection or mass lesion/mass effect. There is mild cerebrocerebellar atrophy. Vascular: There are scattered calcifications of the carotid siphons but no hyperdense central vasculature. Skull: No fracture or focal bone lesion is seen. Sinuses/Orbits: No acute findings. No mastoid or middle ear effusion. There is membrane thickening in the posterior ethmoid air cells, right sphenoid air cell without fluid levels. Other: None. CT CHEST FINDINGS Cardiovascular: Cardiac size is normal. There are calcifications in the proximal LAD coronary artery. There is no pericardial effusion. There is mild aortic atherosclerosis without aneurysm or dissection. There are mild calcifications and thickening in the aortic valve but no aortic root dilatation. Pulmonary arteries are normal caliber and centrally clear, pulmonary veins are decompressed. Mediastinum/Nodes: The lower poles of the thyroid gland are unremarkable. Axillary spaces are clear. No intrathoracic adenopathy is seen. There  is a small hiatal hernia. Thoracic esophagus unremarkable aside from small amounts of scattered retained fluid. There is no tracheal filling defect. Lungs/Pleura: There is a lobular irregular 1.7 x 1.2 cm mass in the left lower lobe posteriorly in the superior segment. There is pleural stranding posterior to this with small surrounding spiculations. There is a second, ovoid to rounded nodule inferior to this in the posterior basal segment of the left lower lobe measuring 1.0 x 0.8 cm. Findings are worrisome for primary bronchogenic neoplasm with a small intraorgan synchronous primary or intraorgan metastatic lesion versus 2 separate metastases. There is subpleural reticulation in the anterior aspect of the left upper lobe which is probably related to left breast XRT. The remainder of both lungs are clear. There is no pleural effusion, thickening or pneumothorax. Musculoskeletal: No chest wall mass or suspicious bone lesions identified. There is thoracic spondylosis. There are postsurgical changes in the upper half of the left breast, small foci of fat necrosis and oil cysts anteriorly. CT ABDOMEN AND PELVIS FINDINGS Hepatobiliary: There is a 4 mm too small to characterize hypodensity in the dome of the left lobe of the liver. There is a 1.3 cm cyst of 11.9 Hounsfield units in the left lobe medial segment. The remaining liver is unremarkable. There are 2 small stones in the proximal gallbladder but no wall thickening or biliary dilatation. Pancreas: No mass enhancement or ductal dilatation is seen. Spleen: No mass enhancement or splenomegaly. Adrenals/Urinary Tract: No adrenal mass or renal cortical mass enhancement. There is no urinary stone or obstruction. There is no bladder thickening. Stomach/Bowel: There is mild gastric fluid distention without wall thickening. The unopacified small bowel is unremarkable. An appendix is not seen in this patient. There is moderate stool retention ascending and proximal  transverse colon. Left colonic diverticula without evidence of colitis or diverticulitis. Vascular/Lymphatic: There are mild calcific plaques in the infrarenal aorta, common iliac arteries. No aneurysm or stenosis is seen. No adenopathy seen in the abdomen or the pelvis.  Reproductive: No mass or other significant abnormality. Status post hysterectomy. Musculoskeletal: There is prominent facet hypertrophy in the lower lumbar spine most significant at L4-5 and L5-S1, with spurring and osteophytes of the pubic symphysis. No destructive bone lesion is seen. Other: There is no incarcerated hernia. There is no free air, hemorrhage or fluid. IMPRESSION: 1. Irregular 1.7 x 1.2 cm left lower lobe mass, with 1.0 x 0.8 cm nodule inferior to it. The gross appearance is more suggestive of a bronchogenic neoplasm with intralobe synchronous primary or metastasis, but both could be metastatic from an extrathoracic primary in this patient with history of left breast cancer. PET-CT is recommended and tissue sampling will almost certainly be needed. 2. Coronary artery and aortic atherosclerosis. 3. Small hiatal hernia, scattered retained fluid in the otherwise unremarkable esophagus. 4. Subpleural reticulation anteriorly in the left upper lobe most likely due to left breast XRT with evidence of partial left mastectomy. 5. 4 mm too small to characterize hypodensity in the hepatic dome and a small cyst in the left lobe medial segment. 6. No adenopathy or mass enhancement in the abdomen or pelvis. 7. Constipation and diverticulosis. 8. Degenerative changes of the spine without destructive bone lesions. 9. No acute intracranial CT findings or appreciable mass. Mild global atrophy. Electronically Signed   By: Almira Bar M.D.   On: 01/03/2022 23:42   CT FEMUR RIGHT W CONTRAST  Result Date: 01/03/2022 CLINICAL DATA:  Metastatic disease, evaluate for pathologic fracture. Fall. History of breast cancer. EXAM: CT OF THE LOWER RIGHT  EXTREMITY WITH CONTRAST TECHNIQUE: Multidetector CT imaging of the lower right extremity was performed according to the standard protocol following intravenous contrast administration. RADIATION DOSE REDUCTION: This exam was performed according to the departmental dose-optimization program which includes automated exposure control, adjustment of the mA and/or kV according to patient size and/or use of iterative reconstruction technique. CONTRAST:  OMNIPAQUE IOHEXOL 300 MG/ML  SOLN COMPARISON:  Right femur x-ray 01/03/2022 FINDINGS: Bones/Joint/Cartilage There is an acute oblique fracture through the mid femoral diaphysis. There is 1 shaft with posterior displacement of the distal fracture fragment with 3 cm of overlap. At the level of the fracture the lateral cortex of the femur demonstrates heterogeneous lytic areas with surrounding soft tissue calcifications. Ligaments Suboptimally assessed by CT. Muscles and Tendons Grossly within normal limits. Soft tissues No focal hematoma or fluid collection. IMPRESSION: 1. Acute displaced fracture of the mid femur with lytic bone lesion/soft tissue calcifications at this level. Findings are compatible with pathologic fracture. Electronically Signed   By: Darliss Cheney M.D.   On: 01/03/2022 23:33   DG Chest Port 1 View  Result Date: 01/03/2022 CLINICAL DATA:  Larey Seat, right-sided pain EXAM: PORTABLE CHEST 1 VIEW COMPARISON:  None Available. FINDINGS: Single frontal view of the chest demonstrates an unremarkable cardiac silhouette. No acute airspace disease, effusion, or pneumothorax. No acute or destructive bony lesions. IMPRESSION: 1. No acute intrathoracic process. Electronically Signed   By: Sharlet Salina M.D.   On: 01/03/2022 18:23   DG Femur Min 2 Views Right  Result Date: 01/03/2022 CLINICAL DATA:  Larey Seat, pain EXAM: RIGHT FEMUR 2 VIEWS COMPARISON:  12/29/2021 FINDINGS: Frontal and lateral views of the right femur are obtained. There is an oblique mid femoral  diaphyseal fracture with valgus angulation at the fracture site. One shaft width posterior displacement of the distal fracture fragment. There is a permeative moth-eaten appearance of the lateral cortex of the mid femoral diaphysis at the fracture site, suggesting underlying  pathologic fracture. The remainder of the right femur is unremarkable. Stable osteoarthritis within the right knee. Right hip is unremarkable. Diffuse soft tissue swelling. IMPRESSION: 1. Displaced fracture mid right femoral diaphysis as above. There is a permeative moth-eaten appearance of the lateral cortex along the fracture margin, suggesting pathologic fracture in this patient with a known history of breast cancer. Electronically Signed   By: Sharlet Salina M.D.   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  Images of the patient's PET scan were reviewed with her.  We discussed that she has a small primary left lower lobe adenocarcinoma with solitary metastasis to the right femur which resulted in a pathologic fracture.  We discussed that her PET scan and brain MRI did not show any other sites of disease.  We discussed that this new problem is different origin from her left breast breast cancer, that being her left lower lung.  Given the small size of her left lower lobe lesion she would be a good candidate for stereotactic body radiation therapy directed to this area.  This will give the best chance for long-term control.  We discussed the details of this treatment set up.  She is well aware of radiation positioning with her previous left breast radiation treatment.  We also discussed that she would benefit from postoperative treatments directed at the right femur.  She had surgical stabilization of the femur but residual tumor remains which resulted in a pathologic fracture.  Over  time this could enlarge and cause more problems as well as pain.  We discussed the details of treatment for both sites with expected side effects and potential long-term toxicities.  She has had left breast radiation therapy but her current problem is in the left lower posterior lung and so should be able to adequately treat this with SBRT techniques.  After careful consideration the patient would like to proceed with radiation therapy as recommended.   PLAN: She will return later this week for simulation and planning.  Anticipate 10 postoperative treatments directed at the right lower lobe and 3 SBRT treatments directed at the left lower lobe, superior segment pulmonary lesion.   65 minutes of total time was spent for this patient encounter, including preparation, face-to-face counseling with the patient and coordination of care, physical exam, and documentation of the encounter.   ------------------------------------------------  Billie Lade, PhD, MD  This document serves as a record of services personally performed by Antony Blackbird, MD. It was created on his behalf by Neena Rhymes, a trained medical scribe. The creation of this record is based on the scribe's personal observations and the provider's statements to them. This document has been checked and approved by the attending provider.

## 2022-01-31 ENCOUNTER — Ambulatory Visit
Admission: RE | Admit: 2022-01-31 | Discharge: 2022-01-31 | Disposition: A | Discharge status: Home / Self Care | Payer: Medicare HMO | Source: Ambulatory Visit | Attending: Radiation Oncology | Admitting: Radiation Oncology

## 2022-01-31 ENCOUNTER — Encounter: Payer: Self-pay | Admitting: Radiation Oncology

## 2022-01-31 ENCOUNTER — Other Ambulatory Visit: Payer: Self-pay

## 2022-01-31 VITALS — BP 164/84 | HR 65 | Temp 98.2°F | Resp 18 | Ht 59.0 in | Wt 161.2 lb

## 2022-01-31 DIAGNOSIS — G4733 Obstructive sleep apnea (adult) (pediatric): Secondary | ICD-10-CM | POA: Diagnosis not present

## 2022-01-31 DIAGNOSIS — E119 Type 2 diabetes mellitus without complications: Secondary | ICD-10-CM | POA: Diagnosis not present

## 2022-01-31 DIAGNOSIS — Z7901 Long term (current) use of anticoagulants: Secondary | ICD-10-CM | POA: Insufficient documentation

## 2022-01-31 DIAGNOSIS — K449 Diaphragmatic hernia without obstruction or gangrene: Secondary | ICD-10-CM | POA: Insufficient documentation

## 2022-01-31 DIAGNOSIS — Z853 Personal history of malignant neoplasm of breast: Secondary | ICD-10-CM | POA: Diagnosis not present

## 2022-01-31 DIAGNOSIS — Z923 Personal history of irradiation: Secondary | ICD-10-CM | POA: Insufficient documentation

## 2022-01-31 DIAGNOSIS — Z7982 Long term (current) use of aspirin: Secondary | ICD-10-CM | POA: Insufficient documentation

## 2022-01-31 DIAGNOSIS — M47814 Spondylosis without myelopathy or radiculopathy, thoracic region: Secondary | ICD-10-CM | POA: Diagnosis not present

## 2022-01-31 DIAGNOSIS — C7951 Secondary malignant neoplasm of bone: Secondary | ICD-10-CM

## 2022-01-31 DIAGNOSIS — Z7902 Long term (current) use of antithrombotics/antiplatelets: Secondary | ICD-10-CM | POA: Diagnosis not present

## 2022-01-31 DIAGNOSIS — Z8673 Personal history of transient ischemic attack (TIA), and cerebral infarction without residual deficits: Secondary | ICD-10-CM | POA: Diagnosis not present

## 2022-01-31 DIAGNOSIS — I6381 Other cerebral infarction due to occlusion or stenosis of small artery: Secondary | ICD-10-CM | POA: Diagnosis not present

## 2022-01-31 DIAGNOSIS — I5032 Chronic diastolic (congestive) heart failure: Secondary | ICD-10-CM | POA: Diagnosis not present

## 2022-01-31 DIAGNOSIS — C3432 Malignant neoplasm of lower lobe, left bronchus or lung: Secondary | ICD-10-CM | POA: Insufficient documentation

## 2022-01-31 DIAGNOSIS — I110 Hypertensive heart disease with heart failure: Secondary | ICD-10-CM | POA: Diagnosis not present

## 2022-01-31 DIAGNOSIS — R609 Edema, unspecified: Secondary | ICD-10-CM | POA: Insufficient documentation

## 2022-01-31 DIAGNOSIS — C50812 Malignant neoplasm of overlapping sites of left female breast: Secondary | ICD-10-CM | POA: Insufficient documentation

## 2022-01-31 DIAGNOSIS — K219 Gastro-esophageal reflux disease without esophagitis: Secondary | ICD-10-CM | POA: Insufficient documentation

## 2022-01-31 DIAGNOSIS — Z79899 Other long term (current) drug therapy: Secondary | ICD-10-CM | POA: Diagnosis not present

## 2022-01-31 DIAGNOSIS — W19XXXD Unspecified fall, subsequent encounter: Secondary | ICD-10-CM | POA: Diagnosis not present

## 2022-01-31 DIAGNOSIS — K5900 Constipation, unspecified: Secondary | ICD-10-CM | POA: Diagnosis not present

## 2022-01-31 DIAGNOSIS — I700 Atherosclerosis of aorta: Secondary | ICD-10-CM | POA: Diagnosis not present

## 2022-01-31 DIAGNOSIS — C3490 Malignant neoplasm of unspecified part of unspecified bronchus or lung: Secondary | ICD-10-CM | POA: Diagnosis not present

## 2022-01-31 DIAGNOSIS — S72351D Displaced comminuted fracture of shaft of right femur, subsequent encounter for closed fracture with routine healing: Secondary | ICD-10-CM | POA: Diagnosis not present

## 2022-01-31 DIAGNOSIS — Z9989 Dependence on other enabling machines and devices: Secondary | ICD-10-CM | POA: Diagnosis not present

## 2022-01-31 DIAGNOSIS — E785 Hyperlipidemia, unspecified: Secondary | ICD-10-CM | POA: Diagnosis not present

## 2022-01-31 DIAGNOSIS — K8020 Calculus of gallbladder without cholecystitis without obstruction: Secondary | ICD-10-CM | POA: Diagnosis not present

## 2022-01-31 DIAGNOSIS — Z170 Estrogen receptor positive status [ER+]: Secondary | ICD-10-CM | POA: Diagnosis not present

## 2022-01-31 DIAGNOSIS — Z7985 Long-term (current) use of injectable non-insulin antidiabetic drugs: Secondary | ICD-10-CM | POA: Diagnosis not present

## 2022-02-01 ENCOUNTER — Encounter (HOSPITAL_COMMUNITY): Payer: Self-pay

## 2022-02-01 ENCOUNTER — Encounter: Payer: Self-pay | Admitting: *Deleted

## 2022-02-02 ENCOUNTER — Ambulatory Visit
Admission: RE | Admit: 2022-02-02 | Discharge: 2022-02-02 | Disposition: A | Discharge status: Home / Self Care | Payer: Medicare HMO | Source: Ambulatory Visit | Attending: Radiation Oncology | Admitting: Radiation Oncology

## 2022-02-02 ENCOUNTER — Other Ambulatory Visit: Payer: Self-pay

## 2022-02-02 ENCOUNTER — Encounter: Payer: Medicare HMO | Admitting: Physical Therapy

## 2022-02-02 DIAGNOSIS — C7951 Secondary malignant neoplasm of bone: Secondary | ICD-10-CM | POA: Insufficient documentation

## 2022-02-02 DIAGNOSIS — C3432 Malignant neoplasm of lower lobe, left bronchus or lung: Secondary | ICD-10-CM | POA: Insufficient documentation

## 2022-02-03 ENCOUNTER — Other Ambulatory Visit (HOSPITAL_COMMUNITY): Payer: Self-pay

## 2022-02-03 DIAGNOSIS — E785 Hyperlipidemia, unspecified: Secondary | ICD-10-CM | POA: Diagnosis not present

## 2022-02-03 DIAGNOSIS — I5032 Chronic diastolic (congestive) heart failure: Secondary | ICD-10-CM | POA: Diagnosis not present

## 2022-02-03 DIAGNOSIS — E119 Type 2 diabetes mellitus without complications: Secondary | ICD-10-CM | POA: Diagnosis not present

## 2022-02-03 DIAGNOSIS — S72351D Displaced comminuted fracture of shaft of right femur, subsequent encounter for closed fracture with routine healing: Secondary | ICD-10-CM | POA: Diagnosis not present

## 2022-02-03 DIAGNOSIS — Z7982 Long term (current) use of aspirin: Secondary | ICD-10-CM | POA: Diagnosis not present

## 2022-02-03 DIAGNOSIS — I110 Hypertensive heart disease with heart failure: Secondary | ICD-10-CM | POA: Diagnosis not present

## 2022-02-03 DIAGNOSIS — Z853 Personal history of malignant neoplasm of breast: Secondary | ICD-10-CM | POA: Diagnosis not present

## 2022-02-03 DIAGNOSIS — C3490 Malignant neoplasm of unspecified part of unspecified bronchus or lung: Secondary | ICD-10-CM | POA: Diagnosis not present

## 2022-02-03 DIAGNOSIS — W19XXXD Unspecified fall, subsequent encounter: Secondary | ICD-10-CM | POA: Diagnosis not present

## 2022-02-03 DIAGNOSIS — Z8673 Personal history of transient ischemic attack (TIA), and cerebral infarction without residual deficits: Secondary | ICD-10-CM | POA: Diagnosis not present

## 2022-02-03 DIAGNOSIS — K219 Gastro-esophageal reflux disease without esophagitis: Secondary | ICD-10-CM | POA: Diagnosis not present

## 2022-02-03 DIAGNOSIS — Z7901 Long term (current) use of anticoagulants: Secondary | ICD-10-CM | POA: Diagnosis not present

## 2022-02-03 DIAGNOSIS — Z9989 Dependence on other enabling machines and devices: Secondary | ICD-10-CM | POA: Diagnosis not present

## 2022-02-03 DIAGNOSIS — Z7985 Long-term (current) use of injectable non-insulin antidiabetic drugs: Secondary | ICD-10-CM | POA: Diagnosis not present

## 2022-02-03 DIAGNOSIS — Z7902 Long term (current) use of antithrombotics/antiplatelets: Secondary | ICD-10-CM | POA: Diagnosis not present

## 2022-02-03 DIAGNOSIS — G4733 Obstructive sleep apnea (adult) (pediatric): Secondary | ICD-10-CM | POA: Diagnosis not present

## 2022-02-04 ENCOUNTER — Encounter: Payer: Medicare HMO | Admitting: Physical Therapy

## 2022-02-04 DIAGNOSIS — R918 Other nonspecific abnormal finding of lung field: Secondary | ICD-10-CM | POA: Diagnosis not present

## 2022-02-04 DIAGNOSIS — G4733 Obstructive sleep apnea (adult) (pediatric): Secondary | ICD-10-CM | POA: Diagnosis not present

## 2022-02-04 DIAGNOSIS — M84453A Pathological fracture, unspecified femur, initial encounter for fracture: Secondary | ICD-10-CM | POA: Diagnosis not present

## 2022-02-04 DIAGNOSIS — I700 Atherosclerosis of aorta: Secondary | ICD-10-CM | POA: Diagnosis not present

## 2022-02-07 DIAGNOSIS — K219 Gastro-esophageal reflux disease without esophagitis: Secondary | ICD-10-CM | POA: Diagnosis not present

## 2022-02-07 DIAGNOSIS — E785 Hyperlipidemia, unspecified: Secondary | ICD-10-CM | POA: Diagnosis not present

## 2022-02-07 DIAGNOSIS — E119 Type 2 diabetes mellitus without complications: Secondary | ICD-10-CM | POA: Diagnosis not present

## 2022-02-07 DIAGNOSIS — Z8673 Personal history of transient ischemic attack (TIA), and cerebral infarction without residual deficits: Secondary | ICD-10-CM | POA: Diagnosis not present

## 2022-02-07 DIAGNOSIS — Z510 Encounter for antineoplastic radiation therapy: Secondary | ICD-10-CM | POA: Insufficient documentation

## 2022-02-07 DIAGNOSIS — Z7985 Long-term (current) use of injectable non-insulin antidiabetic drugs: Secondary | ICD-10-CM | POA: Diagnosis not present

## 2022-02-07 DIAGNOSIS — Z853 Personal history of malignant neoplasm of breast: Secondary | ICD-10-CM | POA: Diagnosis not present

## 2022-02-07 DIAGNOSIS — Z7982 Long term (current) use of aspirin: Secondary | ICD-10-CM | POA: Diagnosis not present

## 2022-02-07 DIAGNOSIS — C3432 Malignant neoplasm of lower lobe, left bronchus or lung: Secondary | ICD-10-CM | POA: Insufficient documentation

## 2022-02-07 DIAGNOSIS — G4733 Obstructive sleep apnea (adult) (pediatric): Secondary | ICD-10-CM | POA: Diagnosis not present

## 2022-02-07 DIAGNOSIS — W19XXXD Unspecified fall, subsequent encounter: Secondary | ICD-10-CM | POA: Diagnosis not present

## 2022-02-07 DIAGNOSIS — Z7901 Long term (current) use of anticoagulants: Secondary | ICD-10-CM | POA: Diagnosis not present

## 2022-02-07 DIAGNOSIS — C7951 Secondary malignant neoplasm of bone: Secondary | ICD-10-CM | POA: Diagnosis not present

## 2022-02-07 DIAGNOSIS — I110 Hypertensive heart disease with heart failure: Secondary | ICD-10-CM | POA: Diagnosis not present

## 2022-02-07 DIAGNOSIS — Z9989 Dependence on other enabling machines and devices: Secondary | ICD-10-CM | POA: Diagnosis not present

## 2022-02-07 DIAGNOSIS — I5032 Chronic diastolic (congestive) heart failure: Secondary | ICD-10-CM | POA: Diagnosis not present

## 2022-02-07 DIAGNOSIS — D0511 Intraductal carcinoma in situ of right breast: Secondary | ICD-10-CM | POA: Insufficient documentation

## 2022-02-07 DIAGNOSIS — Z7902 Long term (current) use of antithrombotics/antiplatelets: Secondary | ICD-10-CM | POA: Diagnosis not present

## 2022-02-07 DIAGNOSIS — C3490 Malignant neoplasm of unspecified part of unspecified bronchus or lung: Secondary | ICD-10-CM | POA: Insufficient documentation

## 2022-02-07 DIAGNOSIS — S72351D Displaced comminuted fracture of shaft of right femur, subsequent encounter for closed fracture with routine healing: Secondary | ICD-10-CM | POA: Diagnosis not present

## 2022-02-08 LAB — FUNGUS CULTURE WITH STAIN

## 2022-02-08 LAB — FUNGAL ORGANISM REFLEX

## 2022-02-08 LAB — FUNGUS CULTURE RESULT

## 2022-02-09 ENCOUNTER — Encounter: Payer: Self-pay | Admitting: Family Medicine

## 2022-02-09 DIAGNOSIS — Z853 Personal history of malignant neoplasm of breast: Secondary | ICD-10-CM | POA: Diagnosis not present

## 2022-02-09 DIAGNOSIS — Z510 Encounter for antineoplastic radiation therapy: Secondary | ICD-10-CM | POA: Diagnosis present

## 2022-02-09 DIAGNOSIS — C7951 Secondary malignant neoplasm of bone: Secondary | ICD-10-CM | POA: Diagnosis not present

## 2022-02-09 DIAGNOSIS — C3432 Malignant neoplasm of lower lobe, left bronchus or lung: Secondary | ICD-10-CM | POA: Diagnosis not present

## 2022-02-09 DIAGNOSIS — Z79899 Other long term (current) drug therapy: Secondary | ICD-10-CM

## 2022-02-09 NOTE — Addendum Note (Signed)
Addended by: Lamar Blinks C on: 02/09/2022 07:51 PM   Modules accepted: Orders

## 2022-02-10 ENCOUNTER — Encounter (HOSPITAL_COMMUNITY): Payer: Self-pay

## 2022-02-13 NOTE — Op Note (Signed)
01/03/2022 - 01/06/2022  12:50 PM  PATIENT:  Laura Bush  03-Oct-1950 female   MEDICAL RECORD NUMBER: [id number]  PREOPERATIVE DIAGNOSIS:  RIGHT FEMORAL SHAFT FRACTURE.  POSTOPERATIVE DIAGNOSIS:   RIGHT FEMORAL SHAFT FRACTURE. STABLE KNEE.  PROCEDURES: 1.  RETROGRADE INTRAMEDULLARY NAILING OF THE RIGHT FEMUR with Biomet 10.5 x 340  mm statically locked nail. 2.  Stress fluoroscopy of the RIGHT knee.  SURGEON:  Astrid Divine. Marcelino Scot, M.D.  ASSISTANT:  Ainsley Spinner, PA-C.  ANESTHESIA:  General.  COMPLICATIONS:  None.  TOURNIQUET: None.  ESTIMATED BLOOD LOSS:  <100 mL.  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  DISPOSITION:  To PACU.  CONDITION:  Stable.  DELAY START OF DVT PROPHYLAXIS BECAUSE OF BLEEDING RISK: NO  BRIEF SUMMARY OF INDICATION FOR PROCEDURE:  Laura Bush is a  71 y.o. who sustained right femur fracture in a ground level fall preceded by right knee pain. Because of cancer history and preceding symptoms, I recommended obtaining the reamings as a specimen for pathology at the time of nailing. The risks and benefits of this operation were discussed with the patient including the possibilities of infection, nerve injury, vessel injury, DVT/ PE, nonunion and hardware failure, loss of motion, arthritis, symptomatic hardware, and need for further surgery among others.  After full discussion, the patient gave consent to proceed.  BRIEF SUMMARY OF PROCEDURE:  After administration of preoperative antibiotics, the patient was taken to the operating room where general anesthesia was induced.  Careful positioning was performed with a bump placed under the right hip, and control of the fracture with distraction during positioning and prepping to prevent neurovascular injury. Standard prep and drape was performed with chlorhexidine scrub and wash initially, then Betadine scrub and paint.  Time-out was held and then  the radiolucent triangle and towel bumps used to gain length and sagittal alignment with traction and flexion of the knee.  A 2.5 cm incision was made at the base of the patellar tendon, extending proximally, followed with a medial parapatellar retinacular incision.  The sharp guide pin was placed directly anterior to Blumensaat's line in the middle of the condyle and advanced on AP and lateral projections into the center-center position of the distal femur. My assistant continued traction while I fine tuned the bumps and he also applied pressure with a mallet to produce coronal plane angulation.  The starting reamer was then used distally while protecting the soft tissues.  This was followed by introduction of the ball-tipped guidewire across the fracture site into the proximal femur up close to the piriformis.  Nail length was measured. Sequential reaming followed while maintaining reduction, encountering chatter at 10 mm and reaming up to 11.5 mm, placing a 10.5 x 340 mm nail. The reamings were sent to pathology. After confirming appropriate seating of the nail beyond Blumensaat's line on the lateral, locking screws were placed distally off the guide, and checked on orthogonal images to confirm placement within the nail and appropriate length. After compressing the fracture, two proximal locks were then placed in static mode using perfect circle technique and a captured screwdriver.  These screws were also confirmed for length and position. At the conclusion of the procedure, I examined the knee for varus and valgus stability after fixation given her antecedent knee pain and did not identify significant instability. Also with my assistant, Ainsley Spinner, PA-C, we then irrigated all wounds thoroughly and closed them in standard layered fashion, working simultaneously to  reduce overall time in the OR. The wounds were dressed and a gently compressive wrap from the ankle to thigh was applied.  The patient was awakened  and taken to the PACU in stable condition.    PROGNOSIS:  The patient will have unrestricted range of motion of the knee and hip, and early mobilization will be encouraged with weight bearing.  DVT prophylaxis will be with weight-based Lovenox. Oncologic bone workup is ongoing. Given the patients associated comorbidities the risk of complications remains elevated which has been discussed with the family.    Astrid Divine. Marcelino Scot, M.D.

## 2022-02-14 ENCOUNTER — Encounter: Payer: Medicare HMO | Admitting: Physical Therapy

## 2022-02-15 ENCOUNTER — Encounter: Payer: Self-pay | Admitting: *Deleted

## 2022-02-15 ENCOUNTER — Other Ambulatory Visit: Payer: Self-pay | Admitting: *Deleted

## 2022-02-15 DIAGNOSIS — Z170 Estrogen receptor positive status [ER+]: Secondary | ICD-10-CM

## 2022-02-15 DIAGNOSIS — I788 Other diseases of capillaries: Secondary | ICD-10-CM | POA: Diagnosis not present

## 2022-02-15 DIAGNOSIS — L570 Actinic keratosis: Secondary | ICD-10-CM | POA: Diagnosis not present

## 2022-02-15 DIAGNOSIS — L718 Other rosacea: Secondary | ICD-10-CM | POA: Diagnosis not present

## 2022-02-16 ENCOUNTER — Encounter: Payer: Self-pay | Admitting: Family Medicine

## 2022-02-16 ENCOUNTER — Encounter: Payer: Self-pay | Admitting: *Deleted

## 2022-02-16 ENCOUNTER — Other Ambulatory Visit: Payer: Self-pay

## 2022-02-16 ENCOUNTER — Ambulatory Visit
Admission: RE | Admit: 2022-02-16 | Discharge: 2022-02-16 | Disposition: A | Discharge status: Home / Self Care | Payer: Medicare HMO | Source: Ambulatory Visit | Attending: Radiation Oncology | Admitting: Radiation Oncology

## 2022-02-16 ENCOUNTER — Encounter: Payer: Medicare HMO | Admitting: Physical Therapy

## 2022-02-16 ENCOUNTER — Inpatient Hospital Stay: Payer: Medicare HMO | Attending: Family

## 2022-02-16 ENCOUNTER — Telehealth: Payer: Self-pay

## 2022-02-16 DIAGNOSIS — C3412 Malignant neoplasm of upper lobe, left bronchus or lung: Secondary | ICD-10-CM | POA: Diagnosis not present

## 2022-02-16 DIAGNOSIS — D0511 Intraductal carcinoma in situ of right breast: Secondary | ICD-10-CM

## 2022-02-16 DIAGNOSIS — C7951 Secondary malignant neoplasm of bone: Secondary | ICD-10-CM | POA: Insufficient documentation

## 2022-02-16 DIAGNOSIS — Z923 Personal history of irradiation: Secondary | ICD-10-CM | POA: Insufficient documentation

## 2022-02-16 DIAGNOSIS — Z79899 Other long term (current) drug therapy: Secondary | ICD-10-CM | POA: Insufficient documentation

## 2022-02-16 DIAGNOSIS — C3432 Malignant neoplasm of lower lobe, left bronchus or lung: Secondary | ICD-10-CM | POA: Diagnosis not present

## 2022-02-16 DIAGNOSIS — Z853 Personal history of malignant neoplasm of breast: Secondary | ICD-10-CM | POA: Diagnosis not present

## 2022-02-16 DIAGNOSIS — Z510 Encounter for antineoplastic radiation therapy: Secondary | ICD-10-CM | POA: Diagnosis present

## 2022-02-16 LAB — CBC WITH DIFFERENTIAL (CANCER CENTER ONLY)
Abs Immature Granulocytes: 0.01 10*3/uL (ref 0.00–0.07)
Basophils Absolute: 0 10*3/uL (ref 0.0–0.1)
Basophils Relative: 1 %
Eosinophils Absolute: 0.1 10*3/uL (ref 0.0–0.5)
Eosinophils Relative: 2 %
HCT: 35.6 % — ABNORMAL LOW (ref 36.0–46.0)
Hemoglobin: 12.3 g/dL (ref 12.0–15.0)
Immature Granulocytes: 0 %
Lymphocytes Relative: 22 %
Lymphs Abs: 0.9 10*3/uL (ref 0.7–4.0)
MCH: 33.2 pg (ref 26.0–34.0)
MCHC: 34.6 g/dL (ref 30.0–36.0)
MCV: 96.2 fL (ref 80.0–100.0)
Monocytes Absolute: 0.4 10*3/uL (ref 0.1–1.0)
Monocytes Relative: 10 %
Neutro Abs: 2.6 10*3/uL (ref 1.7–7.7)
Neutrophils Relative %: 65 %
Platelet Count: 204 10*3/uL (ref 150–400)
RBC: 3.7 MIL/uL — ABNORMAL LOW (ref 3.87–5.11)
RDW: 12 % (ref 11.5–15.5)
WBC Count: 4 10*3/uL (ref 4.0–10.5)
nRBC: 0 % (ref 0.0–0.2)

## 2022-02-16 LAB — CMP (CANCER CENTER ONLY)
ALT: 16 U/L (ref 0–44)
AST: 18 U/L (ref 15–41)
Albumin: 4.7 g/dL (ref 3.5–5.0)
Alkaline Phosphatase: 133 U/L — ABNORMAL HIGH (ref 38–126)
Anion gap: 9 (ref 5–15)
BUN: 21 mg/dL (ref 8–23)
CO2: 26 mmol/L (ref 22–32)
Calcium: 10.1 mg/dL (ref 8.9–10.3)
Chloride: 103 mmol/L (ref 98–111)
Creatinine: 0.77 mg/dL (ref 0.44–1.00)
GFR, Estimated: >60 mL/min (ref >60)
Glucose, Bld: 109 mg/dL — ABNORMAL HIGH (ref 70–99)
Potassium: 3.9 mmol/L (ref 3.5–5.1)
Sodium: 138 mmol/L (ref 135–145)
Total Bilirubin: 0.5 mg/dL (ref 0.3–1.2)
Total Protein: 6.9 g/dL (ref 6.5–8.1)

## 2022-02-16 LAB — RAD ONC ARIA SESSION SUMMARY
Course Elapsed Days: 0
Plan Fractions Treated to Date: 1
Plan Prescribed Dose Per Fraction: 18 Gy
Plan Total Fractions Prescribed: 3
Plan Total Prescribed Dose: 54 Gy
Reference Point Dosage Given to Date: 18 Gy
Reference Point Session Dosage Given: 18 Gy
Session Number: 1

## 2022-02-16 LAB — IRON AND IRON BINDING CAPACITY (CC-WL,HP ONLY)
Iron: 91 ug/dL (ref 28–170)
Saturation Ratios: 22 % (ref 10.4–31.8)
TIBC: 407 ug/dL (ref 250–450)
UIBC: 316 ug/dL (ref 148–442)

## 2022-02-16 LAB — FERRITIN: Ferritin: 28 ng/mL (ref 11–307)

## 2022-02-16 NOTE — Progress Notes (Signed)
Patient had [facility] drawn today due to limited results on [facility] testing. Radiation starts today. Will bring patient into office once liquid biopsy returns.   Oncology Nurse Navigator Documentation     02/16/2022   11:30 AM  Oncology Nurse Navigator Flowsheets  Phase of Treatment Radiation  Radiation Actual Start Date: 02/16/2022  Radiation Expected End Date: 03/04/2022  Navigator Follow Up Date: 03/01/2022  Navigator Follow Up Reason: Engineer, [place]  Navigator Encounter Type Appt/Treatment Plan Review  Patient Visit Type MedOnc  Treatment Phase Pre-Tx/Tx Discussion  Barriers/Navigation Needs Coordination of Care;Education  Interventions None Required  Acuity Level 2-Minimal Needs (1-2 Barriers Identified)  Support Groups/Services Friends and Family  Time Spent with Patient 15

## 2022-02-16 NOTE — Telephone Encounter (Signed)
Received call from [place] with pt's dental office requesting clearance for pt to have a dental cleaning today.  Labs and treatment plan reviewed. Pt is clear to receive dental cleaning today from an oncologic standpoint by Lottie Dawson, NP. This note faxed to [place] at [phone]. dph

## 2022-02-17 ENCOUNTER — Ambulatory Visit
Admission: RE | Admit: 2022-02-17 | Discharge: 2022-02-17 | Disposition: A | Discharge status: Home / Self Care | Payer: Medicare HMO | Source: Ambulatory Visit | Attending: Radiation Oncology | Admitting: Radiation Oncology

## 2022-02-17 ENCOUNTER — Other Ambulatory Visit: Payer: Self-pay

## 2022-02-17 DIAGNOSIS — Z853 Personal history of malignant neoplasm of breast: Secondary | ICD-10-CM | POA: Diagnosis not present

## 2022-02-17 DIAGNOSIS — Z510 Encounter for antineoplastic radiation therapy: Secondary | ICD-10-CM | POA: Diagnosis not present

## 2022-02-17 DIAGNOSIS — C3432 Malignant neoplasm of lower lobe, left bronchus or lung: Secondary | ICD-10-CM | POA: Diagnosis not present

## 2022-02-17 DIAGNOSIS — C7951 Secondary malignant neoplasm of bone: Secondary | ICD-10-CM | POA: Diagnosis not present

## 2022-02-17 LAB — RAD ONC ARIA SESSION SUMMARY
Course Elapsed Days: 1
Plan Fractions Treated to Date: 2
Plan Prescribed Dose Per Fraction: 3 Gy
Plan Total Fractions Prescribed: 5
Plan Total Prescribed Dose: 15 Gy
Reference Point Dosage Given to Date: 6 Gy
Reference Point Session Dosage Given: 3 Gy
Session Number: 2

## 2022-02-18 ENCOUNTER — Other Ambulatory Visit: Payer: Self-pay

## 2022-02-18 ENCOUNTER — Ambulatory Visit
Admission: RE | Admit: 2022-02-18 | Discharge: 2022-02-18 | Disposition: A | Discharge status: Home / Self Care | Payer: Medicare HMO | Source: Ambulatory Visit | Attending: Radiation Oncology | Admitting: Radiation Oncology

## 2022-02-18 DIAGNOSIS — Z510 Encounter for antineoplastic radiation therapy: Secondary | ICD-10-CM | POA: Diagnosis not present

## 2022-02-18 DIAGNOSIS — C7951 Secondary malignant neoplasm of bone: Secondary | ICD-10-CM | POA: Diagnosis not present

## 2022-02-18 DIAGNOSIS — Z853 Personal history of malignant neoplasm of breast: Secondary | ICD-10-CM | POA: Diagnosis not present

## 2022-02-18 DIAGNOSIS — C3432 Malignant neoplasm of lower lobe, left bronchus or lung: Secondary | ICD-10-CM | POA: Diagnosis not present

## 2022-02-18 LAB — RAD ONC ARIA SESSION SUMMARY
Course Elapsed Days: 2
Plan Fractions Treated to Date: 2
Plan Prescribed Dose Per Fraction: 18 Gy
Plan Total Fractions Prescribed: 3
Plan Total Prescribed Dose: 54 Gy
Reference Point Dosage Given to Date: 36 Gy
Reference Point Session Dosage Given: 18 Gy
Session Number: 3

## 2022-02-21 ENCOUNTER — Ambulatory Visit: Payer: Medicare HMO | Admitting: Radiation Oncology

## 2022-02-21 ENCOUNTER — Other Ambulatory Visit: Payer: Self-pay

## 2022-02-21 ENCOUNTER — Encounter: Payer: Self-pay | Admitting: Radiation Oncology

## 2022-02-21 ENCOUNTER — Ambulatory Visit: Payer: Medicare HMO

## 2022-02-21 ENCOUNTER — Ambulatory Visit
Admission: RE | Admit: 2022-02-21 | Discharge: 2022-02-21 | Disposition: A | Discharge status: Home / Self Care | Payer: Medicare HMO | Source: Ambulatory Visit | Attending: Radiation Oncology | Admitting: Radiation Oncology

## 2022-02-21 DIAGNOSIS — Z853 Personal history of malignant neoplasm of breast: Secondary | ICD-10-CM | POA: Diagnosis not present

## 2022-02-21 DIAGNOSIS — C3432 Malignant neoplasm of lower lobe, left bronchus or lung: Secondary | ICD-10-CM | POA: Diagnosis not present

## 2022-02-21 DIAGNOSIS — C7951 Secondary malignant neoplasm of bone: Secondary | ICD-10-CM | POA: Diagnosis not present

## 2022-02-21 DIAGNOSIS — Z510 Encounter for antineoplastic radiation therapy: Secondary | ICD-10-CM | POA: Diagnosis not present

## 2022-02-21 LAB — RAD ONC ARIA SESSION SUMMARY
Course Elapsed Days: 5
Plan Fractions Treated to Date: 4
Plan Prescribed Dose Per Fraction: 3 Gy
Plan Total Fractions Prescribed: 5
Plan Total Prescribed Dose: 15 Gy
Reference Point Dosage Given to Date: 12 Gy
Reference Point Session Dosage Given: 3 Gy
Session Number: 4

## 2022-02-21 LAB — ACID FAST CULTURE WITH REFLEXED SENSITIVITIES (MYCOBACTERIA): Acid Fast Culture: NEGATIVE

## 2022-02-22 ENCOUNTER — Ambulatory Visit
Admission: RE | Admit: 2022-02-22 | Discharge: 2022-02-22 | Disposition: A | Discharge status: Home / Self Care | Payer: Medicare HMO | Source: Ambulatory Visit | Attending: Radiation Oncology | Admitting: Radiation Oncology

## 2022-02-22 ENCOUNTER — Other Ambulatory Visit: Payer: Self-pay

## 2022-02-22 DIAGNOSIS — C3432 Malignant neoplasm of lower lobe, left bronchus or lung: Secondary | ICD-10-CM | POA: Diagnosis not present

## 2022-02-22 DIAGNOSIS — Z853 Personal history of malignant neoplasm of breast: Secondary | ICD-10-CM | POA: Diagnosis not present

## 2022-02-22 DIAGNOSIS — C3490 Malignant neoplasm of unspecified part of unspecified bronchus or lung: Secondary | ICD-10-CM

## 2022-02-22 DIAGNOSIS — C7951 Secondary malignant neoplasm of bone: Secondary | ICD-10-CM | POA: Diagnosis not present

## 2022-02-22 DIAGNOSIS — Z510 Encounter for antineoplastic radiation therapy: Secondary | ICD-10-CM | POA: Diagnosis not present

## 2022-02-22 LAB — RAD ONC ARIA SESSION SUMMARY
Course Elapsed Days: 6
Plan Fractions Treated to Date: 5
Plan Prescribed Dose Per Fraction: 3 Gy
Plan Total Fractions Prescribed: 5
Plan Total Prescribed Dose: 15 Gy
Reference Point Dosage Given to Date: 15 Gy
Reference Point Session Dosage Given: 3 Gy
Session Number: 5

## 2022-02-23 ENCOUNTER — Other Ambulatory Visit: Payer: Self-pay

## 2022-02-23 ENCOUNTER — Ambulatory Visit: Payer: Medicare HMO

## 2022-02-23 DIAGNOSIS — C3432 Malignant neoplasm of lower lobe, left bronchus or lung: Secondary | ICD-10-CM | POA: Diagnosis not present

## 2022-02-23 DIAGNOSIS — Z510 Encounter for antineoplastic radiation therapy: Secondary | ICD-10-CM | POA: Diagnosis not present

## 2022-02-23 DIAGNOSIS — C7951 Secondary malignant neoplasm of bone: Secondary | ICD-10-CM | POA: Diagnosis not present

## 2022-02-23 DIAGNOSIS — Z853 Personal history of malignant neoplasm of breast: Secondary | ICD-10-CM | POA: Diagnosis not present

## 2022-02-23 LAB — RAD ONC ARIA SESSION SUMMARY
Course Elapsed Days: 7
Plan Fractions Treated to Date: 1
Plan Prescribed Dose Per Fraction: 3 Gy
Plan Total Fractions Prescribed: 8
Plan Total Prescribed Dose: 24 Gy
Reference Point Dosage Given to Date: 18 Gy
Reference Point Session Dosage Given: 3 Gy
Session Number: 6

## 2022-02-24 ENCOUNTER — Other Ambulatory Visit: Payer: Self-pay

## 2022-02-24 ENCOUNTER — Ambulatory Visit: Payer: Medicare HMO

## 2022-02-24 DIAGNOSIS — C7951 Secondary malignant neoplasm of bone: Secondary | ICD-10-CM | POA: Diagnosis present

## 2022-02-24 DIAGNOSIS — C3432 Malignant neoplasm of lower lobe, left bronchus or lung: Secondary | ICD-10-CM | POA: Diagnosis not present

## 2022-02-24 DIAGNOSIS — G4733 Obstructive sleep apnea (adult) (pediatric): Secondary | ICD-10-CM | POA: Diagnosis not present

## 2022-02-24 DIAGNOSIS — Z510 Encounter for antineoplastic radiation therapy: Secondary | ICD-10-CM | POA: Diagnosis present

## 2022-02-24 DIAGNOSIS — Z853 Personal history of malignant neoplasm of breast: Secondary | ICD-10-CM | POA: Diagnosis not present

## 2022-02-24 LAB — RAD ONC ARIA SESSION SUMMARY
Course Elapsed Days: 8
Plan Fractions Treated to Date: 2
Plan Prescribed Dose Per Fraction: 3 Gy
Plan Total Fractions Prescribed: 8
Plan Total Prescribed Dose: 24 Gy
Reference Point Dosage Given to Date: 21 Gy
Reference Point Session Dosage Given: 3 Gy
Session Number: 7

## 2022-02-25 ENCOUNTER — Ambulatory Visit
Admission: RE | Admit: 2022-02-25 | Discharge: 2022-02-25 | Disposition: A | Discharge status: Home / Self Care | Payer: Medicare HMO | Source: Ambulatory Visit | Attending: Radiation Oncology | Admitting: Radiation Oncology

## 2022-02-25 ENCOUNTER — Other Ambulatory Visit: Payer: Self-pay

## 2022-02-25 DIAGNOSIS — C7951 Secondary malignant neoplasm of bone: Secondary | ICD-10-CM | POA: Diagnosis not present

## 2022-02-25 DIAGNOSIS — C3490 Malignant neoplasm of unspecified part of unspecified bronchus or lung: Secondary | ICD-10-CM | POA: Diagnosis not present

## 2022-02-25 DIAGNOSIS — C3432 Malignant neoplasm of lower lobe, left bronchus or lung: Secondary | ICD-10-CM | POA: Diagnosis not present

## 2022-02-25 DIAGNOSIS — Z510 Encounter for antineoplastic radiation therapy: Secondary | ICD-10-CM | POA: Diagnosis not present

## 2022-02-25 DIAGNOSIS — Z853 Personal history of malignant neoplasm of breast: Secondary | ICD-10-CM | POA: Diagnosis not present

## 2022-02-25 LAB — RAD ONC ARIA SESSION SUMMARY
Course Elapsed Days: 9
Plan Fractions Treated to Date: 3
Plan Prescribed Dose Per Fraction: 3 Gy
Plan Total Fractions Prescribed: 8
Plan Total Prescribed Dose: 24 Gy
Reference Point Dosage Given to Date: 24 Gy
Reference Point Session Dosage Given: 3 Gy
Session Number: 8

## 2022-02-28 ENCOUNTER — Other Ambulatory Visit: Payer: Self-pay

## 2022-02-28 ENCOUNTER — Ambulatory Visit
Admission: RE | Admit: 2022-02-28 | Discharge: 2022-02-28 | Disposition: A | Discharge status: Home / Self Care | Payer: Medicare HMO | Source: Ambulatory Visit | Attending: Radiation Oncology | Admitting: Radiation Oncology

## 2022-02-28 ENCOUNTER — Encounter: Payer: Self-pay | Admitting: Family

## 2022-02-28 DIAGNOSIS — C3432 Malignant neoplasm of lower lobe, left bronchus or lung: Secondary | ICD-10-CM | POA: Diagnosis not present

## 2022-02-28 DIAGNOSIS — Z853 Personal history of malignant neoplasm of breast: Secondary | ICD-10-CM | POA: Diagnosis not present

## 2022-02-28 DIAGNOSIS — C7951 Secondary malignant neoplasm of bone: Secondary | ICD-10-CM | POA: Diagnosis not present

## 2022-02-28 DIAGNOSIS — Z510 Encounter for antineoplastic radiation therapy: Secondary | ICD-10-CM | POA: Diagnosis not present

## 2022-02-28 LAB — RAD ONC ARIA SESSION SUMMARY
Course Elapsed Days: 12
Plan Fractions Treated to Date: 4
Plan Prescribed Dose Per Fraction: 3 Gy
Plan Total Fractions Prescribed: 8
Plan Total Prescribed Dose: 24 Gy
Reference Point Dosage Given to Date: 27 Gy
Reference Point Session Dosage Given: 3 Gy
Session Number: 9

## 2022-03-01 ENCOUNTER — Other Ambulatory Visit: Payer: Self-pay | Admitting: Family Medicine

## 2022-03-01 ENCOUNTER — Ambulatory Visit: Payer: Medicare HMO

## 2022-03-01 ENCOUNTER — Encounter: Payer: Self-pay | Admitting: *Deleted

## 2022-03-01 ENCOUNTER — Ambulatory Visit
Admission: RE | Admit: 2022-03-01 | Discharge: 2022-03-01 | Disposition: A | Discharge status: Home / Self Care | Payer: Medicare HMO | Source: Ambulatory Visit | Attending: Radiation Oncology | Admitting: Radiation Oncology

## 2022-03-01 ENCOUNTER — Other Ambulatory Visit: Payer: Self-pay

## 2022-03-01 DIAGNOSIS — C3432 Malignant neoplasm of lower lobe, left bronchus or lung: Secondary | ICD-10-CM | POA: Diagnosis not present

## 2022-03-01 DIAGNOSIS — C7951 Secondary malignant neoplasm of bone: Secondary | ICD-10-CM | POA: Diagnosis not present

## 2022-03-01 DIAGNOSIS — Z853 Personal history of malignant neoplasm of breast: Secondary | ICD-10-CM | POA: Diagnosis not present

## 2022-03-01 DIAGNOSIS — Z510 Encounter for antineoplastic radiation therapy: Secondary | ICD-10-CM | POA: Diagnosis not present

## 2022-03-01 LAB — RAD ONC ARIA SESSION SUMMARY
Course Elapsed Days: 13
Plan Fractions Treated to Date: 5
Plan Prescribed Dose Per Fraction: 3 Gy
Plan Total Fractions Prescribed: 8
Plan Total Prescribed Dose: 24 Gy
Reference Point Dosage Given to Date: 30 Gy
Reference Point Session Dosage Given: 3 Gy
Session Number: 10

## 2022-03-01 MED ORDER — SONAFINE EX EMUL
1.0000 | Freq: Once | CUTANEOUS | Status: AC
  Administered 2022-03-01: 1 via TOPICAL

## 2022-03-01 NOTE — Progress Notes (Signed)
[facility] results received. Dr Marin Olp would like to follow up with patient once radiation is complete to discuss next treatment phase. Message sent to patient via MyChart to update her. Message sent to scheduling.   Oncology Nurse Navigator Documentation     03/01/2022   11:00 AM  Oncology Nurse Navigator Flowsheets  Navigator Follow Up Date: 03/04/2022  Navigator Follow Up Reason: Follow-up Appointment  Navigator Location [facility]  Navigator Encounter Type Pathology Review;Appt/Treatment Plan Review;MyChart  Patient Visit Type MedOnc  Treatment Phase Active Tx  Barriers/Navigation Needs Coordination of Care;Education  Education Other  Interventions Coordination of Care;Education  Acuity Level 2-Minimal Needs (1-2 Barriers Identified)  Coordination of Care Appts  Education Method Written  Support Groups/Services Friends and Family  Time Spent with Patient 15

## 2022-03-02 ENCOUNTER — Other Ambulatory Visit: Payer: Self-pay

## 2022-03-02 ENCOUNTER — Ambulatory Visit
Admission: RE | Admit: 2022-03-02 | Discharge: 2022-03-02 | Disposition: A | Discharge status: Home / Self Care | Payer: Medicare HMO | Source: Ambulatory Visit | Attending: Radiation Oncology | Admitting: Radiation Oncology

## 2022-03-02 DIAGNOSIS — C7951 Secondary malignant neoplasm of bone: Secondary | ICD-10-CM | POA: Diagnosis not present

## 2022-03-02 DIAGNOSIS — C3432 Malignant neoplasm of lower lobe, left bronchus or lung: Secondary | ICD-10-CM | POA: Diagnosis not present

## 2022-03-02 DIAGNOSIS — Z853 Personal history of malignant neoplasm of breast: Secondary | ICD-10-CM | POA: Diagnosis not present

## 2022-03-02 DIAGNOSIS — Z510 Encounter for antineoplastic radiation therapy: Secondary | ICD-10-CM | POA: Diagnosis not present

## 2022-03-02 DIAGNOSIS — S72301D Unspecified fracture of shaft of right femur, subsequent encounter for closed fracture with routine healing: Secondary | ICD-10-CM | POA: Diagnosis not present

## 2022-03-02 LAB — RAD ONC ARIA SESSION SUMMARY
Course Elapsed Days: 14
Plan Fractions Treated to Date: 6
Plan Prescribed Dose Per Fraction: 3 Gy
Plan Total Fractions Prescribed: 8
Plan Total Prescribed Dose: 24 Gy
Reference Point Dosage Given to Date: 33 Gy
Reference Point Session Dosage Given: 3 Gy
Session Number: 11

## 2022-03-03 ENCOUNTER — Other Ambulatory Visit: Payer: Self-pay

## 2022-03-03 ENCOUNTER — Ambulatory Visit
Admission: RE | Admit: 2022-03-03 | Discharge: 2022-03-03 | Disposition: A | Discharge status: Home / Self Care | Payer: Medicare HMO | Source: Ambulatory Visit | Attending: Radiation Oncology | Admitting: Radiation Oncology

## 2022-03-03 ENCOUNTER — Other Ambulatory Visit: Payer: Medicare HMO

## 2022-03-03 DIAGNOSIS — C7951 Secondary malignant neoplasm of bone: Secondary | ICD-10-CM | POA: Diagnosis not present

## 2022-03-03 DIAGNOSIS — Z853 Personal history of malignant neoplasm of breast: Secondary | ICD-10-CM | POA: Diagnosis not present

## 2022-03-03 DIAGNOSIS — Z510 Encounter for antineoplastic radiation therapy: Secondary | ICD-10-CM | POA: Diagnosis not present

## 2022-03-03 DIAGNOSIS — C3432 Malignant neoplasm of lower lobe, left bronchus or lung: Secondary | ICD-10-CM | POA: Diagnosis not present

## 2022-03-03 LAB — RAD ONC ARIA SESSION SUMMARY
Course Elapsed Days: 15
Plan Fractions Treated to Date: 7
Plan Prescribed Dose Per Fraction: 3 Gy
Plan Total Fractions Prescribed: 8
Plan Total Prescribed Dose: 24 Gy
Reference Point Dosage Given to Date: 36 Gy
Reference Point Session Dosage Given: 3 Gy
Session Number: 12

## 2022-03-04 ENCOUNTER — Other Ambulatory Visit: Payer: Self-pay

## 2022-03-04 ENCOUNTER — Ambulatory Visit: Payer: Medicare HMO

## 2022-03-04 ENCOUNTER — Inpatient Hospital Stay: Payer: Medicare HMO | Admitting: Hematology & Oncology

## 2022-03-04 ENCOUNTER — Encounter: Payer: Self-pay | Admitting: *Deleted

## 2022-03-04 ENCOUNTER — Encounter: Payer: Self-pay | Admitting: Radiation Oncology

## 2022-03-04 ENCOUNTER — Ambulatory Visit
Admission: RE | Admit: 2022-03-04 | Discharge: 2022-03-04 | Disposition: A | Discharge status: Home / Self Care | Payer: Medicare HMO | Source: Ambulatory Visit | Attending: Radiation Oncology | Admitting: Radiation Oncology

## 2022-03-04 ENCOUNTER — Encounter: Payer: Self-pay | Admitting: Hematology & Oncology

## 2022-03-04 VITALS — BP 123/66 | HR 73 | Temp 98.1°F | Resp 18 | Ht 59.0 in | Wt 158.0 lb

## 2022-03-04 DIAGNOSIS — C3490 Malignant neoplasm of unspecified part of unspecified bronchus or lung: Secondary | ICD-10-CM

## 2022-03-04 DIAGNOSIS — C7951 Secondary malignant neoplasm of bone: Secondary | ICD-10-CM | POA: Diagnosis not present

## 2022-03-04 DIAGNOSIS — Z923 Personal history of irradiation: Secondary | ICD-10-CM | POA: Diagnosis not present

## 2022-03-04 DIAGNOSIS — C3432 Malignant neoplasm of lower lobe, left bronchus or lung: Secondary | ICD-10-CM | POA: Diagnosis not present

## 2022-03-04 DIAGNOSIS — Z853 Personal history of malignant neoplasm of breast: Secondary | ICD-10-CM | POA: Diagnosis not present

## 2022-03-04 DIAGNOSIS — Z79899 Other long term (current) drug therapy: Secondary | ICD-10-CM | POA: Diagnosis not present

## 2022-03-04 DIAGNOSIS — C3412 Malignant neoplasm of upper lobe, left bronchus or lung: Secondary | ICD-10-CM | POA: Diagnosis not present

## 2022-03-04 DIAGNOSIS — Z510 Encounter for antineoplastic radiation therapy: Secondary | ICD-10-CM | POA: Diagnosis not present

## 2022-03-04 LAB — RAD ONC ARIA SESSION SUMMARY
Course Elapsed Days: 16
Plan Fractions Treated to Date: 8
Plan Prescribed Dose Per Fraction: 3 Gy
Plan Total Fractions Prescribed: 8
Plan Total Prescribed Dose: 24 Gy
Reference Point Dosage Given to Date: 39 Gy
Reference Point Session Dosage Given: 3 Gy
Session Number: 13

## 2022-03-04 MED ORDER — ALECENSA 150 MG PO CAPS
600.0000 mg | ORAL_CAPSULE | Freq: Two times a day (BID) | ORAL | 6 refills | Status: DC
  Filled 2022-03-04: qty 240, 30d supply, fill #0

## 2022-03-04 NOTE — Progress Notes (Signed)
Patient here to discuss systemic treatment. She is ALK positive and Dr Marin Olp will be prescribing oral chemo.   Visited with patient and her husband prior to MD visit. This navigator is out of the office next week. I made sure they had contact numbers for Specialty Pharmacy, Social Work and Nutrition all of which will be referred to. Answered their questions to their satisfaction. They understand the typical wait time for specialty drugs and to call the office with any questions or concerns they may have.   Oncology Nurse Navigator Documentation     03/04/2022    4:00 PM  Oncology Nurse Navigator Flowsheets  Phase of Treatment Chemo  Chemotherapy Pending- Reason: Authorization  Radiation Actual End Date: 03/04/2022  Navigator Follow Up Date: 03/15/2022  Navigator Follow Up Reason: Patient Call  Navigator Location [facility]  Navigator Encounter Type Follow-up Appt  Patient Visit Type MedOnc  Treatment Phase Active Tx  Barriers/Navigation Needs Coordination of International aid/development worker for Soil scientist;Other  Interventions Education;Psycho-Social Support;Referrals  Acuity Level 2-Minimal Needs (1-2 Barriers Identified)  Referrals Nutrition/dietician;Social Work  Associate Professor Groups/Services Friends and Family  Time Spent with Patient 63

## 2022-03-04 NOTE — Progress Notes (Signed)
Hematology and Oncology Follow Up Visit  Laura Bush [id number] [birth year] 71 y.o. 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 3 months  - start in 03/2022 S/P SBRT to LUL -- completed on 03/04/2022 S/P XRT to RIGHT femur -- completed on 03/04/2022 Alecensa 600 mg po BID -- start on 03/08/2022     Interim History:  [name] is back for follow-up.  She completed her radiation therapy to the right femur and to the left upper lobe today.  She had SBRT to the left upper lobe.  She had radiation therapy of the 13 treatments to the left hip.  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  As such, I think we can think about getting her on systemic therapy with Alecensa.  I realize that on her PET scan that she had done, there is no obvious evidence of metastatic disease.  However, she must have micro metastatic disease given the fact that she had tumor in the left upper lung that metastasized to her right hip.  I think that the Eyvonne Left is a reasonable option for her.  She feels well.  She is pretty mobile.  She has been doing some exercises for the right hip.  She is eating well.  She is on Ozempic for weight loss.  She is being followed by dietary medicine.  She has had no change in bowel or bladder habits.  She has had no diarrhea.  There may be a little bit of constipation.  She has a little bit of swelling in the right leg.  This happened after her surgery.  This is been chronic.  She has had no headache.  There is no cough or shortness of breath.  Overall, I would say performance status is ECOG 1.   Medications:  Current Outpatient Medications:    acetaminophen (TYLENOL) 500 MG tablet, Take 1 tablet (500 mg total) by mouth every 8 (eight) hours as needed for mild pain or moderate pain.,  Disp: 30 tablet, Rfl: 0   aMILoride (MIDAMOR) 5 MG tablet, Take 1 tablet (5 mg total) by mouth daily. (Patient taking differently: Take 5 mg by mouth in the morning.), Disp: 90 tablet, Rfl: 3   aspirin EC 81 MG tablet, Take 81 mg by mouth daily. Swallow whole., Disp: , Rfl:    B Complex-C (SUPER B COMPLEX PO), Take 1 tablet by mouth daily with breakfast., Disp: , Rfl:    Calcium Carb-Cholecalciferol (CALCIUM + D3 PO), Take 1 tablet by mouth daily., Disp: , Rfl:    fexofenadine (ALLEGRA) 180 MG tablet, Take 180 mg by mouth in the morning., Disp: , Rfl:    fluticasone (CUTIVATE) 0.05 % cream, Apply 1 application. topically See admin instructions. Apply to dry area of the left thigh once a day, Disp: , Rfl:    ipratropium (ATROVENT) 0.06 % nasal spray, Place 2 sprays into both nostrils 4 (four) times daily. Use as needed (Patient taking differently: Place 2 sprays into both nostrils 4 (four) times daily as needed for rhinitis.), Disp: 15 mL, Rfl: 4   methocarbamol (ROBAXIN) 500 MG tablet, Take 1 tablet (500 mg total) by mouth every 6 (six) hours as needed for muscle spasms., Disp: 30 tablet, Rfl: 0   NIFEdipine (PROCARDIA-XL/NIFEDICAL-XL) 30 MG 24 hr tablet, TAKE ONE TABLET  BY MOUTH DAILY, Disp: 90 tablet, Rfl: 1   pantoprazole (PROTONIX) 40 MG tablet, Take 1 tablet (40 mg total) by mouth daily. (Patient taking differently: Take 40 mg by mouth daily before breakfast.), Disp: 90 tablet, Rfl: 3   Semaglutide, 2 MG/DOSE, (OZEMPIC, 2 MG/DOSE,) 8 MG/3ML SOPN, Inject 2 mg into the skin once a week. (Patient taking differently: Inject 2 mg into the skin every Saturday.), Disp: 9 mL, Rfl: 0   simvastatin (ZOCOR) 20 MG tablet, Take 1 tablet (20 mg total) by mouth daily. (Patient taking differently: Take 20 mg by mouth every evening.), Disp: 90 tablet, Rfl: 3  Allergies:  Allergies  Allergen Reactions   Diclofenac Hypertension   Amlodipine Swelling and Other (See Comments)    Limbs swell, not the throat    Lanolin Other (See Comments)    Sneezing and watery eyes. Allergic to [facility].   Tape Other (See Comments)    Redness (Bandaids also)    Past Medical History, Surgical history, Social history, and Family History were reviewed and updated.  Review of Systems: Review of Systems  Constitutional: Negative.   HENT:  Negative.    Eyes: Negative.   Respiratory: Negative.    Cardiovascular: Negative.   Gastrointestinal: Negative.   Endocrine: Negative.   Genitourinary: Negative.    Musculoskeletal:  Positive for arthralgias.  Skin: Negative.   Neurological: Negative.   Hematological: Negative.   Psychiatric/Behavioral: Negative.      Physical Exam:  height is _0  (1.499 m) and weight is 158 lb (71.7 kg). Her oral temperature is 98.1 F (36.7 C). Her blood pressure is 123/66 and her pulse is 73. Her respiration is 18 and oxygen saturation is 100%.   Wt Readings from Last 3 Encounters:  03/04/22 158 lb (71.7 kg)  01/31/22 161 lb 4 oz (73.1 kg)  01/27/22 161 lb 1.9 oz (73.1 kg)    Physical Exam Vitals reviewed.  HENT:     Head: Normocephalic and atraumatic.  Eyes:     Pupils: Pupils are equal, round, and reactive to light.  Cardiovascular:     Rate and Rhythm: Normal rate and regular rhythm.     Heart sounds: Normal heart sounds.  Pulmonary:     Effort: Pulmonary effort is normal.     Breath sounds: Normal breath sounds.  Abdominal:     General: Bowel sounds are normal.     Palpations: Abdomen is soft.  Musculoskeletal:        General: No tenderness or deformity. Normal range of motion.     Cervical back: Normal range of motion.     Comments: Extremities shows the healing surgical scar in the right thigh.  Lymphadenopathy:     Cervical: No cervical adenopathy.  Skin:    General: Skin is warm and dry.     Findings: No erythema or rash.  Neurological:     Mental Status: She is alert and oriented to person, place, and time.  Psychiatric:        Behavior: Behavior  normal.        Thought Content: Thought content normal.        Judgment: Judgment normal.      Lab Results  Component Value Date   WBC 4.0 02/16/2022   HGB 12.3 02/16/2022   HCT 35.6 (L) 02/16/2022   MCV 96.2 02/16/2022   PLT 204 02/16/2022     Chemistry      Component Value Date/Time   NA 138 02/16/2022 0912  NA 142 05/29/2017 1002   K 3.9 02/16/2022 0912   K 3.9 05/29/2017 1002   CL 103 02/16/2022 0912   CO2 26 02/16/2022 0912   CO2 28 05/29/2017 1002   BUN 21 02/16/2022 0912   BUN 13.5 05/29/2017 1002   CREATININE 0.77 02/16/2022 0912   CREATININE 0.81 04/06/2020 1059   CREATININE 0.8 05/29/2017 1002      Component Value Date/Time   CALCIUM 10.1 02/16/2022 0912   CALCIUM 9.4 05/29/2017 1002   ALKPHOS 133 (H) 02/16/2022 0912   ALKPHOS 39 (L) 05/29/2017 1002   AST 18 02/16/2022 0912   AST 18 05/29/2017 1002   ALT 16 02/16/2022 0912   ALT 19 05/29/2017 1002   BILITOT 0.5 02/16/2022 0912   BILITOT 0.48 05/29/2017 1002       Impression and Plan: [name] is a very charming 71 year old white female.  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  This was actually a small lesion.  She has completed radiation therapy.  Now, we will see about getting her on systemic therapy.  This is almost in the "adjuvant" setting.  Again, I have to believe that she has micrometastatic disease since she has disease in the right hip.  I will send in Alecensa 600 mg p.o. twice daily.  Hopefully, she will be able to tolerate this dose.  If not, we can certainly make some dosage adjustments.  She will also need Xgeva.  I will plan to use this every 3 months.  We will give her the first treatment when she comes in in August.  I am just happy that everything is gone so well.  I am very happy that she has the ALK mutation that we can target with alectinib.  I would like to see her back in about a month or so.  I probably would do another PET scan on her in  early October.  I want to make sure that we were at least 2 months out from completion of her radiation therapy before we do any scanning.    Volanda Napoleon, MD 7/28/20235:11 PM

## 2022-03-05 ENCOUNTER — Other Ambulatory Visit (HOSPITAL_COMMUNITY): Payer: Self-pay

## 2022-03-07 ENCOUNTER — Other Ambulatory Visit (HOSPITAL_COMMUNITY): Payer: Self-pay

## 2022-03-07 ENCOUNTER — Telehealth: Payer: Self-pay | Admitting: Pharmacist

## 2022-03-07 ENCOUNTER — Encounter: Payer: Self-pay | Admitting: Hematology & Oncology

## 2022-03-07 ENCOUNTER — Telehealth: Payer: Self-pay | Admitting: Pharmacy Technician

## 2022-03-07 DIAGNOSIS — R918 Other nonspecific abnormal finding of lung field: Secondary | ICD-10-CM | POA: Diagnosis not present

## 2022-03-07 DIAGNOSIS — M84453A Pathological fracture, unspecified femur, initial encounter for fracture: Secondary | ICD-10-CM | POA: Diagnosis not present

## 2022-03-07 DIAGNOSIS — G4733 Obstructive sleep apnea (adult) (pediatric): Secondary | ICD-10-CM | POA: Diagnosis not present

## 2022-03-07 DIAGNOSIS — I700 Atherosclerosis of aorta: Secondary | ICD-10-CM | POA: Diagnosis not present

## 2022-03-07 NOTE — Telephone Encounter (Signed)
Oral Oncology Patient Advocate Encounter   Submitted application for assistance for Alecensa to [place].   Application submitted via fax to [phone]     I will continue to check the status until final determination.   Lady Deutscher, CPhT-Adv Pharmacy Patient Advocate Specialist [place] Patient Advocate Team Direct Number: [phone]  Fax: [phone]

## 2022-03-07 NOTE — Telephone Encounter (Signed)
Oral Oncology Pharmacist Encounter   Received notification from [place] that prior authorization for Eyvonne Left is required.   PA submitted on CoverMyMeds Key: [id number] Status is pending   Oral Oncology Clinic will continue to follow.   Leron Croak, PharmD, BCPS, [facility] Hematology/Oncology Clinical Pharmacist [facility] [phone] 03/07/2022 8:50 AM

## 2022-03-07 NOTE — Telephone Encounter (Signed)
Oral Oncology Pharmacist Encounter  Received new prescription for Alecensa (alectinib) for the treatment of stage IV non-small cell lung cancer, ALK-positive, planned duration until disease progression or unacceptable drug toxicity.  CMP and CBC w/ Diff from 02/16/22 assessed, no relevant lab abnormalities requiring baseline dose adjustment required at this time. Prescription dose and frequency assessed for appropriateness. Appropriate for therapy initiation.   Current medication list in Epic reviewed, no relevant/significant DDIs with Alecensa identified.  Evaluated chart and no patient barriers to medication adherence noted.   Prescription has been e-scribed to the [facility] for benefits analysis and approval.  Due to high copay [phone]) will proceed with applying for manufacturer assistance for patient at this time through [place].   Oral Oncology Clinic will continue to follow for initial counseling and start date.  Leron Croak, PharmD, BCPS, [facility] Hematology/Oncology Clinical Pharmacist [facility] [place] [phone] 03/07/2022 8:42 AM

## 2022-03-07 NOTE — Telephone Encounter (Signed)
Oral Oncology Patient Advocate Encounter   Began application for assistance for Alecensa through [place].   Application will be submitted upon completion of necessary supporting documentation.   [employer] phone number [phone].   I will continue to check the status until final determination.   Lady Deutscher, CPhT-Adv Pharmacy Patient Advocate Specialist [place] Patient Advocate Team Direct Number: [phone]  Fax: [phone]

## 2022-03-07 NOTE — Telephone Encounter (Signed)
Oral Oncology Pharmacist Encounter  Prior Authorization for Laura Bush has been approved.    PA# [id number] Effective dates: 08/08/2021 through 08/07/2022  Patients co-pay is $1744.31  Due to high copay Oral Chemotherapy Clinic will proceed with applying for manufacturer assistance for patient at this time.   Leron Croak, PharmD, BCPS, [facility] Hematology/Oncology Clinical Pharmacist [facility] [place] [phone] 03/07/2022 8:51 AM

## 2022-03-09 ENCOUNTER — Telehealth: Payer: Self-pay

## 2022-03-09 ENCOUNTER — Ambulatory Visit: Payer: Medicare HMO | Admitting: Dietician

## 2022-03-09 ENCOUNTER — Inpatient Hospital Stay: Payer: Medicare HMO

## 2022-03-09 NOTE — Progress Notes (Signed)
Nutrition Focused Physical Exam: unable to perform NFPE   Medications: Ozempic, Protonix, Zocor, calcium w/VitD3(1000IU), B-complex,  caps    Labs: 01/05/22  Vit D 32.61, 02/16/22 Hgb now WNL has been increasing   Anthropometrics:    Height:  47" Weight:  03/04/22  158# 01/31/22  161.3# 01/03/22  165# UBW:  intentional weight loss  DBW: 135-140# BMI: 31.91   Estimated Energy Needs  Kcals: 1800-2200 Protein: 72-86 Fluid: 2 L   NUTRITION DIAGNOSIS:  no nutritional concerns at this time   INTERVENTION:   Educated on importance of adequate calorie and protein energy intake with nutrient dense foods when possible to maintain weight/strength. Cautioned against more than 4# weight loss per month during cancer treatments.  Encouraged consideration of adding Vit D3 1000 IU daily to increase serum Vit D3 to optimal level 50. Emailed Nutrition Tip sheet for Nutrition During Cancer Treatment.  Contact information provided.  MONITORING, EVALUATION, GOAL: weight trends, nutrition impact symptoms, PO intake, labs   Next Visit: PRN at patient or provider request  April Manson, RDN, LDN Registered Dietitian, [place] (Usual office hours: Tuesday-Thursday) Mobile: [phone] Remote Office: [phone]

## 2022-03-09 NOTE — Telephone Encounter (Signed)
CSW attempted to contact patient.  Left vm.

## 2022-03-11 ENCOUNTER — Inpatient Hospital Stay: Payer: Medicare HMO | Attending: Hematology & Oncology

## 2022-03-11 DIAGNOSIS — C3432 Malignant neoplasm of lower lobe, left bronchus or lung: Secondary | ICD-10-CM | POA: Insufficient documentation

## 2022-03-11 DIAGNOSIS — C7951 Secondary malignant neoplasm of bone: Secondary | ICD-10-CM | POA: Insufficient documentation

## 2022-03-11 NOTE — Progress Notes (Signed)
[place] Work  Initial Assessment   Laura Bush is a 71 y.o. year old female contacted by phone. Clinical Social Work was referred by nurse navigator for assessment of psychosocial needs.   SDOH (Social Determinants of Health) assessments performed: Yes   SDOH Screenings   Alcohol Screen: Low Risk  (10/19/2021)   Alcohol Screen    Last Alcohol Screening Score (AUDIT): 3  Depression (PHQ2-9): Low Risk  (12/29/2021)   Depression (PHQ2-9)    PHQ-2 Score: 0  Financial Resource Strain: Low Risk  (10/26/2021)   Overall Financial Resource Strain (CARDIA)    Difficulty of Paying Living Expenses: Not hard at all  Food Insecurity: No Food Insecurity (10/19/2021)   Hunger Vital Sign    Worried About Running Out of Food in the Last Year: Never true    Ran Out of Food in the Last Year: Never true  Housing: Low Risk  (11/11/2020)   Housing    Last Housing Risk Score: 0  Physical Activity: Insufficiently Active (10/26/2021)   Exercise Vital Sign    Days of Exercise per Week: 2 days    Minutes of Exercise per Session: 60 min  Social Connections: Socially Integrated (10/19/2021)   Social Connection and Isolation Panel [NHANES]    Frequency of Communication with Friends and Family: More than three times a week    Frequency of Social Gatherings with Friends and Family: Three times a week    Attends Religious Services: More than 4 times per year    Active Member of Clubs or Organizations: Yes    Attends Archivist Meetings: More than 4 times per year    Marital Status: Married  Stress: Stress Concern Present (11/11/2020)   [name]    Feeling of Stress : To some extent  Tobacco Use: Low Risk  (03/04/2022)   Patient History    Smoking Tobacco Use: Never    Smokeless Tobacco Use: Never    Passive Exposure: Not on file  Transportation Needs: No Transportation Needs (11/11/2020)   PRAPARE - Transportation     Lack of Transportation (Medical): No    Lack of Transportation (Non-Medical): No     Distress Screen completed: No    12/15/2014    2:26 PM  ONCBCN DISTRESS SCREENING  Screening Type Initial Screening  Distress experienced in past week (1-10) 2  Emotional problem type Adjusting to illness  Physician notified of physical symptoms Yes  Referral to clinical social work No      Family/Social Information:  Housing Arrangement: patient lives with her spouse. Family members/support persons in your life? Family, Friends, and [employer] concerns: no  Employment: Retired.  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  Patient is a church member. Services Currently in place:  Medicare  Coping/ Adjustment to diagnosis: Patient understands treatment plan and what happens next? yes Concerns about diagnosis and/or treatment: Patient is having difficulty sleeping.  Encouraged her to contact MD. Patient reported stressors: None Hopes and/or priorities: Her family is her priority. Patient enjoys time with family/ friends Current coping skills/ strengths: Ability for insight , Average or above average intelligence , Capable of independent living , Communication skills , Scientist, research (life sciences) , General fund of knowledge , Motivation for treatment/growth , Religious Affiliation , and Supportive family/friends     SUMMARY: Current  SDOH Barriers:  None at this time.  Clinical Social Work Clinical Goal(s):  No clinical social work goals at this time  Interventions: Discussed common feeling and emotions when being diagnosed with cancer, and the importance of support during treatment Informed patient of the support team roles and support services at [facility] Provided [place] contact information and encouraged patient to call with any questions or  concerns Discussed patient's history and her connection with the [employer] and [employer] and [employer].   Follow Up Plan: Patient will contact CSW with any support or resource needs Patient verbalizes understanding of plan: Yes    Rodman Pickle Saarah Dewing, LCSW

## 2022-03-14 NOTE — Telephone Encounter (Signed)
Oral Oncology Patient Advocate Encounter   Received notification that the application for assistance for Alecensa through [place] has been approved.   [employer] phone number [phone].   Effective dates: 03/14/2022 through until.  I have spoken to the patient.   Lady Deutscher, CPhT-Adv Pharmacy Patient Advocate Specialist [place] Patient Advocate Team Direct Number: [phone]  Fax: [phone]

## 2022-03-15 ENCOUNTER — Encounter: Payer: Self-pay | Admitting: *Deleted

## 2022-03-15 NOTE — Progress Notes (Signed)
Reviewed chart for progress regarding her oral chemotherapy. Called and spoke to patient. She states at this time she is waiting for the pharmacy to call her with delivery instructions. She will call Wells Guiles PharmD when she receives the medication to initiate therapy.   Encouraged her to call us if she hasn't heard about delivery in the next 48h. She agrees.  Oncology Nurse Navigator Documentation     03/15/2022    1:00 PM  Oncology Nurse Navigator Flowsheets  Navigator Follow Up Date: 03/21/2022  Navigator Location [facility]  Navigator Encounter Type Telephone;Appt/Treatment Plan Review  Telephone Patient Update;Outgoing Call  Patient Visit Type MedOnc  Treatment Phase Active Tx  Barriers/Navigation Needs Coordination of Care;Education  Education Other  Interventions Education;Psycho-Social Support  Acuity Level 2-Minimal Needs (1-2 Barriers Identified)  Education Method Verbal  Support Groups/Services Friends and Family  Time Spent with Patient 15

## 2022-03-16 ENCOUNTER — Inpatient Hospital Stay: Payer: Medicare HMO

## 2022-03-16 ENCOUNTER — Encounter (INDEPENDENT_AMBULATORY_CARE_PROVIDER_SITE_OTHER): Payer: Self-pay

## 2022-03-16 ENCOUNTER — Encounter: Payer: Self-pay | Admitting: Hematology & Oncology

## 2022-03-18 ENCOUNTER — Other Ambulatory Visit: Payer: Self-pay | Admitting: Family Medicine

## 2022-03-18 DIAGNOSIS — E782 Mixed hyperlipidemia: Secondary | ICD-10-CM

## 2022-03-21 ENCOUNTER — Encounter: Payer: Self-pay | Admitting: *Deleted

## 2022-03-21 NOTE — Progress Notes (Signed)
Patient is scheduled to receive her medication on 03/24/2022. Will call to follow up on delivery and initiation on 03/25/22.  Oncology Nurse Navigator Documentation     03/21/2022    8:15 AM  Oncology Nurse Navigator Flowsheets  Navigator Follow Up Date: 03/25/2022  Navigator Follow Up Reason: Patient Call  Navigator Location [facility]  Navigator Encounter Type Appt/Treatment Plan Review  Patient Visit Type MedOnc  Treatment Phase Active Tx  Barriers/Navigation Needs Coordination of Care;Education  Interventions None Required  Acuity Level 2-Minimal Needs (1-2 Barriers Identified)  Education Method Verbal  Support Groups/Services Friends and Family  Time Spent with Patient 15

## 2022-03-23 NOTE — Telephone Encounter (Signed)
Oral Chemotherapy Pharmacist Encounter  I spoke with patient for overview of: Alecensa (alectinib) for the treatment of ALK-rearrangement positive, metastatic NSCLC, planned duration until disease progression or unacceptable toxicity.  Counseled patient on administration, dosing, side effects, monitoring, drug-food interactions, safe handling, storage, and disposal.  Patient will take Alecensa 149m capsules, 4 capsules (6072m by mouth twice daily with food.  Alecensa start date: 03/24/22 PM  Adverse effects include but are not limited to: bradycardia, muscle pain, edema, constipation, fatigue, skin photosensitivity, visual disturbances, headache, skin rash, increased blood sugar, decreased electrolytes, hepatotoxicity, and decreased blood counts.   Reviewed with patient importance of keeping a medication schedule and plan for any missed doses. No barriers to medication adherence identified.  Medication reconciliation performed and medication/allergy list updated.  Insurance authorization for AlEyvonne Leftas been obtained. Due to high copay [place] patient's [place] on 03/[place]/22.  All questions answered.  Ms. JoMartiniqueoiced understanding and appreciation.   Medication education handout placed in mail for patient. Patient knows to call the office with questions or concerns. Oral Chemotherapy Clinic phone number provided to patient.   ReLeron CroakPharmD, BCPS, [facility]/Oncology Clinical Pharmacist [facility] [phone]/16/2023 9:27 AM

## 2022-03-24 ENCOUNTER — Encounter: Payer: Self-pay | Admitting: *Deleted

## 2022-03-25 ENCOUNTER — Other Ambulatory Visit: Payer: Self-pay | Admitting: *Deleted

## 2022-03-25 ENCOUNTER — Encounter: Payer: Self-pay | Admitting: Hematology & Oncology

## 2022-03-25 ENCOUNTER — Encounter: Payer: Self-pay | Admitting: *Deleted

## 2022-03-25 DIAGNOSIS — T451X5A Adverse effect of antineoplastic and immunosuppressive drugs, initial encounter: Secondary | ICD-10-CM

## 2022-03-25 MED ORDER — PROCHLORPERAZINE MALEATE 10 MG PO TABS: 10.0000 mg | ORAL_TABLET | Freq: Four times a day (QID) | ORAL | 0 refills | Status: DC | PRN

## 2022-03-25 NOTE — Progress Notes (Signed)
Patient started her Alecensa yesterday. Message sent to patient checking on her tolerability. She doesn't have any antiemetics at home, so prescription sent to her preferred pharmacy.   Oncology Nurse Navigator Documentation     03/25/2022   12:45 PM  Oncology Nurse Navigator Flowsheets  Phase of Treatment Chemo  Chemotherapy Actual Start Date: 03/24/2022  Navigator Follow Up Date: 04/06/2022  Navigator Follow Up Reason: Follow-up Appointment  Navigator Location [facility]  Navigator Encounter Type MyChart  Patient Visit Type MedOnc  Treatment Phase Active Tx  Barriers/Navigation Needs Coordination of Care;Education  Education Other  Interventions Education;Psycho-Social Support  Acuity Level 2-Minimal Needs (1-2 Barriers Identified)  Education Method Written  Support Groups/Services Friends and Family  Time Spent with Patient 15

## 2022-03-31 ENCOUNTER — Encounter: Payer: Self-pay | Admitting: *Deleted

## 2022-04-05 ENCOUNTER — Encounter: Payer: Self-pay | Admitting: Radiation Oncology

## 2022-04-06 ENCOUNTER — Inpatient Hospital Stay: Payer: Medicare HMO

## 2022-04-06 ENCOUNTER — Encounter: Payer: Self-pay | Admitting: Hematology & Oncology

## 2022-04-06 ENCOUNTER — Encounter: Payer: Self-pay | Admitting: *Deleted

## 2022-04-06 ENCOUNTER — Inpatient Hospital Stay: Payer: Medicare HMO | Admitting: Hematology & Oncology

## 2022-04-06 VITALS — BP 112/69 | HR 62 | Temp 97.9°F | Resp 17 | Wt 160.0 lb

## 2022-04-06 DIAGNOSIS — C3432 Malignant neoplasm of lower lobe, left bronchus or lung: Secondary | ICD-10-CM | POA: Diagnosis not present

## 2022-04-06 DIAGNOSIS — C3490 Malignant neoplasm of unspecified part of unspecified bronchus or lung: Secondary | ICD-10-CM

## 2022-04-06 DIAGNOSIS — S72301D Unspecified fracture of shaft of right femur, subsequent encounter for closed fracture with routine healing: Secondary | ICD-10-CM | POA: Diagnosis not present

## 2022-04-06 DIAGNOSIS — C7951 Secondary malignant neoplasm of bone: Secondary | ICD-10-CM

## 2022-04-06 LAB — CBC WITH DIFFERENTIAL (CANCER CENTER ONLY)
Abs Immature Granulocytes: 0.01 K/uL (ref 0.00–0.07)
Basophils Absolute: 0 K/uL (ref 0.0–0.1)
Basophils Relative: 1 %
Eosinophils Absolute: 0.2 K/uL (ref 0.0–0.5)
Eosinophils Relative: 4 %
HCT: 37.1 % (ref 36.0–46.0)
Hemoglobin: 12.9 g/dL (ref 12.0–15.0)
Immature Granulocytes: 0 %
Lymphocytes Relative: 19 %
Lymphs Abs: 0.8 K/uL (ref 0.7–4.0)
MCH: 33 pg (ref 26.0–34.0)
MCHC: 34.8 g/dL (ref 30.0–36.0)
MCV: 94.9 fL (ref 80.0–100.0)
Monocytes Absolute: 0.3 K/uL (ref 0.1–1.0)
Monocytes Relative: 7 %
Neutro Abs: 2.9 K/uL (ref 1.7–7.7)
Neutrophils Relative %: 69 %
Platelet Count: 161 K/uL (ref 150–400)
RBC: 3.91 MIL/uL (ref 3.87–5.11)
RDW: 13.2 % (ref 11.5–15.5)
WBC Count: 4.1 K/uL (ref 4.0–10.5)
nRBC: 0 % (ref 0.0–0.2)

## 2022-04-06 LAB — CMP (CANCER CENTER ONLY)
ALT: 26 U/L (ref 0–44)
AST: 29 U/L (ref 15–41)
Albumin: 4.5 g/dL (ref 3.5–5.0)
Alkaline Phosphatase: 104 U/L (ref 38–126)
Anion gap: 8 (ref 5–15)
BUN: 25 mg/dL — ABNORMAL HIGH (ref 8–23)
CO2: 31 mmol/L (ref 22–32)
Calcium: 10.1 mg/dL (ref 8.9–10.3)
Chloride: 101 mmol/L (ref 98–111)
Creatinine: 1.19 mg/dL — ABNORMAL HIGH (ref 0.44–1.00)
GFR, Estimated: 49 mL/min — ABNORMAL LOW (ref >60)
Glucose, Bld: 148 mg/dL — ABNORMAL HIGH (ref 70–99)
Potassium: 3.8 mmol/L (ref 3.5–5.1)
Sodium: 140 mmol/L (ref 135–145)
Total Bilirubin: 0.7 mg/dL (ref 0.3–1.2)
Total Protein: 7.2 g/dL (ref 6.5–8.1)

## 2022-04-06 LAB — LACTATE DEHYDROGENASE: LDH: 184 U/L (ref 98–192)

## 2022-04-06 MED ORDER — DENOSUMAB 120 MG/1.7ML ~~LOC~~ SOLN
120.0000 mg | Freq: Once | SUBCUTANEOUS | Status: AC
  Administered 2022-04-06: 120 mg via SUBCUTANEOUS
  Filled 2022-04-06: qty 1.7

## 2022-04-06 MED ORDER — TRAZODONE HCL 50 MG PO TABS: 50.0000 mg | ORAL_TABLET | Freq: Every evening | ORAL | 3 refills | Status: DC | PRN

## 2022-04-06 NOTE — Progress Notes (Signed)
Radiation Oncology         [phone]) [phone] ________________________________  Name: Laura Bush MRN: [id number]  Date: 04/07/2022  DOB: 01-28-1951  Follow-Up Visit Note  CC: Copland, Gay Filler, MD  Volanda Napoleon, MD  No diagnosis found.  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 A diagnosis of Malignant 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, estrogen receptor positive ([place]) was also pertinent to this visit.   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 The patient tolerated radiation therapy relatively well. On the date of her final treatment, the patient reported pain to her right femur rated at a 2/10 (improved), fatigue, and skin irritation to her left back from radiation. She denied any other symptoms.     Since completing radiation, the patient followed up with Dr. Marin Olp on 03/04/22. During which time, the patient consented to beginning systemic therapy consisting of Alecensa (warranted due to her tumor being ALK positive). The patient also agreed to proceed with  Xgeva q3 months (first treatment yesterday). Per her most recent follow up visit with Dr. Marin Olp yesterday, the patient denied any side effects from [facility], and reported feeling well overall.   Otherwise, no significant interval history since the patient was seen for her initial consultation this past June.   ***                               Allergies:  is allergic to  diclofenac, amlodipine, lanolin, and tape.  Meds: Current Outpatient Medications  Medication Sig Dispense Refill   acetaminophen (TYLENOL) 500 MG tablet Take 1 tablet (500 mg total) by mouth every 8 (eight) hours as needed for mild pain or moderate pain. 30 tablet 0   alectinib (ALECENSA) 150 MG capsule Take 4 capsules (600 mg total) by mouth 2 (two) times daily with a meal. 240 capsule 6   aMILoride (MIDAMOR) 5 MG tablet Take 1 tablet (5 mg total) by mouth daily. (Patient taking differently: Take 5 mg by mouth in the morning.) 90 tablet 3   aspirin EC 81 MG tablet Take 81 mg by mouth daily. Swallow whole.     B Complex-C (SUPER B COMPLEX PO) Take 1 tablet by mouth daily with breakfast.     Calcium Carb-Cholecalciferol (CALCIUM + D3 PO) Take 1 tablet by mouth daily.     cholecalciferol (VITAMIN D3) 25 MCG (1000 UNIT) tablet Take 1,000 Units by mouth daily.     diphenhydrAMINE (BENADRYL) 25 mg capsule Take 25 mg by mouth at bedtime as needed.     fexofenadine (ALLEGRA) 180 MG tablet Take 180 mg by mouth in the morning.     fluticasone (CUTIVATE) 0.05 % cream Apply 1 application. topically See admin instructions. Apply to dry area of the left thigh once a day     ipratropium (ATROVENT) 0.06 % nasal spray Place 2 sprays into both nostrils 4 (four) times  daily. Use as needed (Patient taking differently: Place 2 sprays into both nostrils 4 (four) times daily as needed for rhinitis.) 15 mL 4   methocarbamol (ROBAXIN) 500 MG tablet Take 1 tablet (500 mg total) by mouth every 6 (six) hours as needed for muscle spasms. 30 tablet 0   NIFEdipine (PROCARDIA-XL/NIFEDICAL-XL) 30 MG 24 hr tablet TAKE ONE TABLET BY MOUTH DAILY 90 tablet 1   pantoprazole (PROTONIX) 40 MG tablet Take 1 tablet (40 mg total) by mouth daily. (Patient taking differently: Take 40 mg by mouth daily before breakfast.) 90 tablet 3   polyethylene glycol (MIRALAX / GLYCOLAX) 17 g packet Take 17 g by mouth daily.     prochlorperazine  (COMPAZINE) 10 MG tablet Take 1 tablet (10 mg total) by mouth every 6 (six) hours as needed for nausea or vomiting. 60 tablet 0   Semaglutide, 2 MG/DOSE, (OZEMPIC, 2 MG/DOSE,) 8 MG/3ML SOPN Inject 2 mg into the skin once a week. (Patient taking differently: Inject 2 mg into the skin every Saturday.) 9 mL 0   simvastatin (ZOCOR) 20 MG tablet TAKE 1 TABLET BY MOUTH EVERY DAY 90 tablet 3   traZODone (DESYREL) 50 MG tablet Take 1 tablet (50 mg total) by mouth at bedtime as needed for sleep. 30 tablet 3   No current facility-administered medications for this encounter.    Physical Findings: The patient is in no acute distress. Patient is alert and oriented.  vitals were not taken for this visit. .  No significant changes. Lungs are clear to auscultation bilaterally. Heart has regular rate and rhythm. No palpable cervical, supraclavicular, or axillary adenopathy. Abdomen soft, non-tender, normal bowel sounds.   Lab Findings: Lab Results  Component Value Date   WBC 4.1 04/06/2022   HGB 12.9 04/06/2022   HCT 37.1 04/06/2022   MCV 94.9 04/06/2022   PLT 161 04/06/2022    Radiographic Findings: No results found.  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 A diagnosis of Malignant 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, estrogen receptor positive ([name]) was also pertinent to this visit.   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  ***  Plan:  ***   *** minutes of total time was spent for this patient encounter, including preparation, face-to-face counseling with the patient and coordination of care, physical exam, and documentation of the encounter. ____________________________________  Blair Promise, PhD, MD  This document serves as a record of services personally performed by Gery Pray, MD. It was created on his behalf by Roney Mans, a trained medical scribe. The creation of this record is based on the scribe's personal observations and the provider's statements to them. This document has been checked and approved by the attending provider.

## 2022-04-06 NOTE — Progress Notes (Incomplete)
  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 A diagnosis of Malignant 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, estrogen receptor positive ([place]) was also pertinent to this visit.   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 On the date of her final treatment, the patient reported pain to her right femur rated at a 2/10 (improved), fatigue, and skin irritation to her left back from radiation. She denied any other symptoms.    Plan: The patient will follow-up with radiation oncology in one month .  ________________________________________________ -----------------------------------  Blair Promise, PhD, MD  This document serves as a record of services personally performed by Gery Pray, MD. It was created on his behalf by Roney Mans, a trained medical scribe. The creation of this record is based on the scribe's personal observations and the provider's statements to them. This document has been checked and approved by the attending provider.

## 2022-04-06 NOTE — Addendum Note (Signed)
Addended by: Volanda Napoleon on: 04/06/2022 09:53 AM   Modules accepted: Orders

## 2022-04-06 NOTE — Patient Instructions (Signed)
Denosumab Injection (Oncology) What is this medication? DENOSUMAB (den oh SUE mab) prevents weakened bones caused by cancer. It may also be used to treat noncancerous bone tumors that cannot be removed by surgery. It can also be used to treat high calcium levels in the blood caused by cancer. It works by blocking a protein that causes bones to break down quickly. This slows down the release of calcium from bones, which lowers calcium levels in your blood. It also makes your bones stronger and less likely to break (fracture). This medicine may be used for other purposes; ask your health care provider or pharmacist if you have questions. COMMON BRAND NAME(S): XGEVA What should I tell my care team before I take this medication? They need to know if you have any of these conditions: Dental disease Having surgery or tooth extraction Infection Kidney disease Low levels of calcium or vitamin D in the blood Malnutrition On hemodialysis Skin conditions or sensitivity Thyroid or parathyroid disease An unusual reaction to denosumab, other medications, foods, dyes, or preservatives Pregnant or trying to get pregnant Breast-feeding How should I use this medication? This medication is for injection under the skin. It is given by your care team in a hospital or clinic setting. A special MedGuide will be given to you before each treatment. Be sure to read this information carefully each time. Talk to your care team about the use of this medication in children. While it may be prescribed for children as young as 13 years for selected conditions, precautions do apply. Overdosage: If you think you have taken too much of this medicine contact a poison control center or emergency room at once. NOTE: This medicine is only for you. Do not share this medicine with others. What if I miss a dose? Keep appointments for follow-up doses. It is important not to miss your dose. Call your care team if you are unable to  keep an appointment. What may interact with this medication? Do not take this medication with any of the following: Other medications containing denosumab This medication may also interact with the following: Medications that lower your chance of fighting infection Steroid medications, such as prednisone or cortisone This list may not describe all possible interactions. Give your health care provider a list of all the medicines, herbs, non-prescription drugs, or dietary supplements you use. Also tell them if you smoke, drink alcohol, or use illegal drugs. Some items may interact with your medicine. What should I watch for while using this medication? Your condition will be monitored carefully while you are receiving this medication. You may need blood work while taking this medication. This medication may increase your risk of getting an infection. Call your care team for advice if you get a fever, chills, sore throat, or other symptoms of a cold or flu. Do not treat yourself. Try to avoid being around people who are sick. You should make sure you get enough calcium and vitamin D while you are taking this medication, unless your care team tells you not to. Discuss the foods you eat and the vitamins you take with your care team. Some people who take this medication have severe bone, joint, or muscle pain. This medication may also increase your risk for jaw problems or a broken thigh bone. Tell your care team right away if you have severe pain in your jaw, bones, joints, or muscles. Tell your care team if you have any pain that does not go away or that gets worse. Talk   to your care team if you may be pregnant. Serious birth defects can occur if you take this medication during pregnancy and for 5 months after the last dose. You will need a negative pregnancy test before starting this medication. Contraception is recommended while taking this medication and for 5 months after the last dose. Your care team  can help you find the option that works for you. What side effects may I notice from receiving this medication? Side effects that you should report to your care team as soon as possible: Allergic reactions--skin rash, itching, hives, swelling of the face, lips, tongue, or throat Bone, joint, or muscle pain Low calcium level--muscle pain or cramps, confusion, tingling, or numbness in the hands or feet Osteonecrosis of the jaw--pain, swelling, or redness in the mouth, numbness of the jaw, poor healing after dental work, unusual discharge from the mouth, visible bones in the mouth Side effects that usually do not require medical attention (report to your care team if they continue or are bothersome): Cough Diarrhea Fatigue Headache Nausea This list may not describe all possible side effects. Call your doctor for medical advice about side effects. You may report side effects to FDA at 1-800-FDA-1088. Where should I keep my medication? This medication is given in a hospital or clinic. It will not be stored at home. NOTE: This sheet is a summary. It may not cover all possible information. If you have questions about this medicine, talk to your doctor, pharmacist, or health care provider.  2023 Elsevier/Gold Standard (2021-12-13 00:00:00)  

## 2022-04-06 NOTE — Progress Notes (Signed)
Hematology and Oncology Follow Up Visit  Laura Bush [id number] 1950/10/05 71 y.o. 04/06/2022   Principle Diagnosis:  Stage IV adenocarcinoma of the left lower lung-oligometastatic disease to the right femur -  ALK (+)  Current Therapy:   Status post right femur repair --01/04/2022 Xgeva 120 mg subcu q. 3 months  -next dose in 06/27/2022  S/P SBRT to LUL -- completed on 03/04/2022 S/P XRT to RIGHT femur -- completed on 03/04/2022 Alecensa 600 mg po BID -- start on 03/24/2022     Interim History:  [name] is back for follow-up.  She has been on Alecensa now for 13 days.  She is doing okay with that.  She had little bit of constipation from this.  She is getting around quite nicely.  She really did well with the surgery for her right hip.  She had radiation therapy to the right hip area.  She had SBRT to the left upper lobe primary.  She is still losing weight.  She is on Ozempic.  She has had no rashes.  There is been no fever.  She has had no cough or shortness of breath.  She has had no headache.  She has had no bleeding.  Overall, I would say that her performance status is ECOG 0.     Medications:  Current Outpatient Medications:    acetaminophen (TYLENOL) 500 MG tablet, Take 1 tablet (500 mg total) by mouth every 8 (eight) hours as needed for mild pain or moderate pain., Disp: 30 tablet, Rfl: 0   alectinib (ALECENSA) 150 MG capsule, Take 4 capsules (600 mg total) by mouth 2 (two) times daily with a meal., Disp: 240 capsule, Rfl: 6   aMILoride (MIDAMOR) 5 MG tablet, Take 1 tablet (5 mg total) by mouth daily. (Patient taking differently: Take 5 mg by mouth in the morning.), Disp: 90 tablet, Rfl: 3   aspirin EC 81 MG tablet, Take 81 mg by mouth daily. Swallow whole., Disp: , Rfl:    B Complex-C (SUPER B COMPLEX PO), Take 1 tablet by mouth daily with breakfast., Disp: , Rfl:    Calcium Carb-Cholecalciferol (CALCIUM + D3 PO), Take 1 tablet by mouth daily., Disp: , Rfl:     cholecalciferol (VITAMIN D3) 25 MCG (1000 UNIT) tablet, Take 1,000 Units by mouth daily., Disp: , Rfl:    diphenhydrAMINE (BENADRYL) 25 mg capsule, Take 25 mg by mouth at bedtime as needed., Disp: , Rfl:    fexofenadine (ALLEGRA) 180 MG tablet, Take 180 mg by mouth in the morning., Disp: , Rfl:    fluticasone (CUTIVATE) 0.05 % cream, Apply 1 application. topically See admin instructions. Apply to dry area of the left thigh once a day, Disp: , Rfl:    ipratropium (ATROVENT) 0.06 % nasal spray, Place 2 sprays into both nostrils 4 (four) times daily. Use as needed (Patient taking differently: Place 2 sprays into both nostrils 4 (four) times daily as needed for rhinitis.), Disp: 15 mL, Rfl: 4   methocarbamol (ROBAXIN) 500 MG tablet, Take 1 tablet (500 mg total) by mouth every 6 (six) hours as needed for muscle spasms., Disp: 30 tablet, Rfl: 0   NIFEdipine (PROCARDIA-XL/NIFEDICAL-XL) 30 MG 24 hr tablet, TAKE ONE TABLET BY MOUTH DAILY, Disp: 90 tablet, Rfl: 1   pantoprazole (PROTONIX) 40 MG tablet, Take 1 tablet (40 mg total) by mouth daily. (Patient taking differently: Take 40 mg by mouth daily before breakfast.), Disp: 90 tablet, Rfl: 3   polyethylene glycol (MIRALAX / GLYCOLAX)  17 g packet, Take 17 g by mouth daily., Disp: , Rfl:    prochlorperazine (COMPAZINE) 10 MG tablet, Take 1 tablet (10 mg total) by mouth every 6 (six) hours as needed for nausea or vomiting., Disp: 60 tablet, Rfl: 0   Semaglutide, 2 MG/DOSE, (OZEMPIC, 2 MG/DOSE,) 8 MG/3ML SOPN, Inject 2 mg into the skin once a week. (Patient taking differently: Inject 2 mg into the skin every Saturday.), Disp: 9 mL, Rfl: 0   simvastatin (ZOCOR) 20 MG tablet, TAKE 1 TABLET BY MOUTH EVERY DAY, Disp: 90 tablet, Rfl: 3  Allergies:  Allergies  Allergen Reactions   Diclofenac Hypertension   Amlodipine Swelling and Other (See Comments)    Limbs swell, not the throat   Lanolin Other (See Comments)    Sneezing and watery eyes. Allergic to [facility]   Tape  Other (See Comments)    Redness (Bandaids also)    Past Medical History, Surgical history, Social history, and Family History were reviewed and updated.  Review of Systems: Review of Systems  Constitutional: Negative.   HENT:  Negative.    Eyes: Negative.   Respiratory: Negative.    Cardiovascular: Negative.   Gastrointestinal: Negative.   Endocrine: Negative.   Genitourinary: Negative.    Musculoskeletal:  Positive for arthralgias.  Skin: Negative.   Neurological: Negative.   Hematological: Negative.   Psychiatric/Behavioral: Negative.      Physical Exam:  weight is 160 lb (72.6 kg). Her oral temperature is 97.9 F (36.6 C). Her blood pressure is 112/69 and her pulse is 62. Her respiration is 17 and oxygen saturation is 100%.   Wt Readings from Last 3 Encounters:  04/06/22 160 lb (72.6 kg)  03/04/22 158 lb (71.7 kg)  01/31/22 161 lb 4 oz (73.1 kg)    Physical Exam Vitals reviewed.  HENT:     Head: Normocephalic and atraumatic.  Eyes:     Pupils: Pupils are equal, round, and reactive to light.  Cardiovascular:     Rate and Rhythm: Normal rate and regular rhythm.     Heart sounds: Normal heart sounds.  Pulmonary:     Effort: Pulmonary effort is normal.     Breath sounds: Normal breath sounds.  Abdominal:     General: Bowel sounds are normal.     Palpations: Abdomen is soft.  Musculoskeletal:        General: No tenderness or deformity. Normal range of motion.     Cervical back: Normal range of motion.     Comments: Extremities shows the healing surgical scar in the right thigh.  Lymphadenopathy:     Cervical: No cervical adenopathy.  Skin:    General: Skin is warm and dry.     Findings: No erythema or rash.  Neurological:     Mental Status: She is alert and oriented to person, place, and time.  Psychiatric:        Behavior: Behavior normal.        Thought Content: Thought content normal.        Judgment: Judgment normal.      Lab Results  Component  Value Date   WBC 4.1 04/06/2022   HGB 12.9 04/06/2022   HCT 37.1 04/06/2022   MCV 94.9 04/06/2022   PLT 161 04/06/2022     Chemistry      Component Value Date/Time   NA 140 04/06/2022 0821   NA 142 05/29/2017 1002   K 3.8 04/06/2022 0821   K 3.9 05/29/2017 1002   CL  101 04/06/2022 0821   CO2 31 04/06/2022 0821   CO2 28 05/29/2017 1002   BUN 25 (H) 04/06/2022 0821   BUN 13.5 05/29/2017 1002   CREATININE 1.19 (H) 04/06/2022 0821   CREATININE 0.81 04/06/2020 1059   CREATININE 0.8 05/29/2017 1002      Component Value Date/Time   CALCIUM 10.1 04/06/2022 0821   CALCIUM 9.4 05/29/2017 1002   ALKPHOS 104 04/06/2022 0821   ALKPHOS 39 (L) 05/29/2017 1002   AST 29 04/06/2022 0821   AST 18 05/29/2017 1002   ALT 26 04/06/2022 0821   ALT 19 05/29/2017 1002   BILITOT 0.7 04/06/2022 0821   BILITOT 0.48 05/29/2017 1002       Impression and Plan: [name] is a very charming 71 year old white female.  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  This was actually a small lesion.  She is doing well on the Alecensa.  Thankfully, she does not have any co-pay with this.  She only been on it for about 2 weeks.  I would not do another PET scan on her probably until November.  She will get her Delton See today.  I am just happy that her quality life is doing well.  I noted that her kidney function is a little bit on the lower side.  Hopefully, with hydration, this will improve the function.  We will plan to get her back to see [place] in about 6 weeks to see how she is doing.  Again, we probably will get a PET scan on her in November.    Volanda Napoleon, MD 8/30/20239:25 AM

## 2022-04-06 NOTE — Progress Notes (Signed)
Patient is doing well. She has been taking Alecensa for about  two weeks now. She denies any significant side effects, but does have some difficulty with constipation. She is taking Elesa Hacker but wants something that may be more effective. She plans to speak to Dr Marin Olp about this.   Oncology Nurse Navigator Documentation     04/06/2022    9:00 AM  Oncology Nurse Navigator Flowsheets  Navigator Follow Up Date: 05/18/2022  Navigator Follow Up Reason: Follow-up Appointment  Navigator Location [facility]  Navigator Encounter Type Follow-up Appt;Appt/Treatment Plan Review  Patient Visit Type MedOnc  Treatment Phase Active Tx  Barriers/Navigation Needs Coordination of Care;Education  Education Pain/ Symptom Management  Interventions Education;Psycho-Social Support  Acuity Level 2-Minimal Needs (1-2 Barriers Identified)  Education Method Verbal  Support Groups/Services Friends and Family  Time Spent with Patient 15

## 2022-04-07 ENCOUNTER — Ambulatory Visit
Admission: RE | Admit: 2022-04-07 | Discharge: 2022-04-07 | Disposition: A | Discharge status: Home / Self Care | Payer: Medicare HMO | Source: Ambulatory Visit | Attending: Radiation Oncology | Admitting: Radiation Oncology

## 2022-04-07 ENCOUNTER — Other Ambulatory Visit: Payer: Self-pay

## 2022-04-07 VITALS — BP 104/65 | HR 60 | Temp 97.3°F | Resp 18 | Ht 59.0 in | Wt 160.4 lb

## 2022-04-07 DIAGNOSIS — Z7982 Long term (current) use of aspirin: Secondary | ICD-10-CM | POA: Diagnosis not present

## 2022-04-07 DIAGNOSIS — G4733 Obstructive sleep apnea (adult) (pediatric): Secondary | ICD-10-CM | POA: Diagnosis not present

## 2022-04-07 DIAGNOSIS — C50812 Malignant neoplasm of overlapping sites of left female breast: Secondary | ICD-10-CM | POA: Diagnosis not present

## 2022-04-07 DIAGNOSIS — C3432 Malignant neoplasm of lower lobe, left bronchus or lung: Secondary | ICD-10-CM | POA: Insufficient documentation

## 2022-04-07 DIAGNOSIS — Z923 Personal history of irradiation: Secondary | ICD-10-CM | POA: Diagnosis not present

## 2022-04-07 DIAGNOSIS — I700 Atherosclerosis of aorta: Secondary | ICD-10-CM | POA: Diagnosis not present

## 2022-04-07 DIAGNOSIS — Z170 Estrogen receptor positive status [ER+]: Secondary | ICD-10-CM | POA: Diagnosis not present

## 2022-04-07 DIAGNOSIS — R918 Other nonspecific abnormal finding of lung field: Secondary | ICD-10-CM | POA: Diagnosis not present

## 2022-04-07 DIAGNOSIS — C7951 Secondary malignant neoplasm of bone: Secondary | ICD-10-CM | POA: Diagnosis not present

## 2022-04-07 DIAGNOSIS — Z79899 Other long term (current) drug therapy: Secondary | ICD-10-CM | POA: Insufficient documentation

## 2022-04-07 DIAGNOSIS — M84453A Pathological fracture, unspecified femur, initial encounter for fracture: Secondary | ICD-10-CM | POA: Diagnosis not present

## 2022-04-07 HISTORY — DX: Personal history of irradiation: Z92.3

## 2022-04-07 NOTE — Progress Notes (Signed)
Terina Mcelhinny [name] is here today for follow up post radiation to the lung and right femur.  Lung Side: Left  Does the patient complain of any of the following: Pain:none Shortness of breath w/wo exertion: none Cough: no Hemoptysis: no Pain with swallowing: no Swallowing/choking concerns: no Appetite: good Energy Level: varies Post radiation skin Changes: some hyperpigmentation on her left upper back.    Additional comments if applicable:  Reports her fatigue is improving.  She saw Dr. Marcelino Scot yesterday and he said her right femur is healing well.  BP 104/65 (BP Location: Left Arm, Patient Position: Sitting, Cuff Size: Normal)   Pulse 60   Temp (!) 97.3 F (36.3 C)   Resp 18   Ht 4\' 11"  (1.499 m)   Wt 160 lb 6.4 oz (72.8 kg)   SpO2 100%   BMI 32.40 kg/m

## 2022-04-12 ENCOUNTER — Other Ambulatory Visit: Payer: Self-pay | Admitting: *Deleted

## 2022-04-12 ENCOUNTER — Encounter: Payer: Self-pay | Admitting: *Deleted

## 2022-04-12 MED ORDER — LACTULOSE 20 GM/30ML PO SOLN: 30.0000 mL | ORAL | 1 refills | Status: DC | PRN

## 2022-04-12 NOTE — Progress Notes (Unsigned)
TeleHealth Visit:  This visit was completed with telemedicine (audio/video) technology. Laura Bush has verbally consented to this TeleHealth visit. The patient is located at home, the provider is located at home. The participants in this visit include the listed provider and patient. The visit was conducted today via MyChart video.  OBESITY Wisdom is here to discuss her progress with her obesity treatment plan along with follow-up of her obesity related diagnoses.   Today's visit was # 26 Starting weight: 196 lbs Starting date: 10/17/2019 Weight at last in office visit: 156 lbs on 12/15/21 Total weight loss: 40 lbs at last in office visit on 12/15/21. Today's reported weight: 157.6 lbs   Nutrition Plan: the Laura Bush with chicken- following somewhat  Current exercise:  yoga once weekly  Interim History: Laura Bush's last office visit with [place] was 12/15/2021.  Her weight has remained stable since then.  She has had an eventful summer.  On Memorial Day weekend she suffered a fractured femur.  This was found to be a pathologic fracture caused by metastatic lung cancer that she was unaware that she had.  She had breast cancer 7 years ago and has been in remission from that.  She has completed radiation this summer and is now on a targeted therapy (alectinib) for her cancer. She said her weights have been variable because this medication causes edema. HCTZ has been helping with this. Laura Bush seems to be in good spirits but does note fatigue. She has OSA and uses her CPAP consistently.  Her husband is also a patient at the clinic.  She has been following pescatarian plan with chicken and also following his category 2 plan sometimes.  She likes the food options offered by the category 2 plan. She has Silver sneakers and would like to exercise more. She says her goal weight is somewhere around 145 pounds.  Reports her whole view on weight loss and weight goal has changed because of recent  developments in her health.  Prefers virtual visit for now due to fatigue.  Assessment/Plan:  1.  Drug-induced constipation Laura Bush notes worsening constipation since starting alectinib.  She is also on Ozempic.  This is a known side effect of the drug.  She had been taking MiraLAX twice daily which was not working well enough.  Dr. Marin Olp prescribed lactulose 30 ml taken every 2 hours as needed for severe constipation.  She has questions about how to take this going forward.  Plan: Advised her to take the lactulose as prescribed until she has a good bowel movement.  Then she may take once to twice daily to maintain good bowel movements. Increase water intake.  2.  Impaired fasting glucose with polyphagia Last A1c was 5.3 on 01/03/2022.  Fasting insulin has not been drawn since March 2021-it was 12.6 at that time. She is on Ozempic 2 mg and tolerating well other than constipation.  Appetite is well controlled. Lab Results  Component Value Date   HGBA1C 5.3 01/03/2022   Lab Results  Component Value Date   INSULIN 12.6 10/17/2019    Plan Continue Ozempic 2 mg weekly.   3. Obesity: Current BMI 31.8 Laura Bush is currently in the action stage of change. As such, her goal is to continue with weight loss efforts.  She has agreed to the Category 2 Plan and the [place] with chicken  Talked about strategies for increasing water intake.  Exercise goals: Increase exercise as tolerated.  Behavioral modification strategies: increasing lean protein intake, decreasing simple carbohydrates, increasing  water intake, and planning for success.  Laura Bush has agreed to follow-up with our clinic in 4 weeks.   No orders of the defined types were placed in this encounter.   There are no discontinued medications.   No orders of the defined types were placed in this encounter.     Objective:   VITALS: Per patient if applicable, see vitals. GENERAL: Alert and in no acute  distress. CARDIOPULMONARY: No increased WOB. Speaking in clear sentences.  PSYCH: Pleasant and cooperative. Speech normal rate and rhythm. Affect is appropriate. Insight and judgement are appropriate. Attention is focused, linear, and appropriate.  NEURO: Oriented as arrived to appointment on time with no prompting.   Lab Results  Component Value Date   CREATININE 1.19 (H) 04/06/2022   BUN 25 (H) 04/06/2022   NA 140 04/06/2022   K 3.8 04/06/2022   CL 101 04/06/2022   CO2 31 04/06/2022   Lab Results  Component Value Date   ALT 26 04/06/2022   AST 29 04/06/2022   ALKPHOS 104 04/06/2022   BILITOT 0.7 04/06/2022   Lab Results  Component Value Date   HGBA1C 5.3 01/03/2022   HGBA1C 5.2 04/06/2020   HGBA1C 5.6 04/03/2019   HGBA1C 5.6 03/28/2018   Lab Results  Component Value Date   INSULIN 12.6 10/17/2019   Lab Results  Component Value Date   TSH 0.88 10/05/2020   Lab Results  Component Value Date   CHOL 156 10/05/2020   HDL 54.30 10/05/2020   LDLCALC 66 10/05/2020   TRIG 182.0 (H) 10/05/2020   CHOLHDL 3 10/05/2020   Lab Results  Component Value Date   WBC 4.1 04/06/2022   HGB 12.9 04/06/2022   HCT 37.1 04/06/2022   MCV 94.9 04/06/2022   PLT 161 04/06/2022   Lab Results  Component Value Date   IRON 91 02/16/2022   TIBC 407 02/16/2022   FERRITIN 28 02/16/2022   Lab Results  Component Value Date   VD25OH 32.61 01/05/2022   VD25OH 44.11 10/05/2020   VD25OH 38.52 04/03/2019    Attestation Statements:   Reviewed by clinician on day of visit: allergies, medications, problem list, medical history, surgical history, family history, social history, and previous encounter notes.   Time spent on visit including the items listed below was 30 minutes.  -preparing to see the patient (e.g., review of tests, history, previous notes) -obtaining and/or reviewing separately obtained history -counseling and educating the patient/family/caregiver -documenting clinical  information in the electronic or other health record

## 2022-04-13 ENCOUNTER — Encounter (INDEPENDENT_AMBULATORY_CARE_PROVIDER_SITE_OTHER): Payer: Self-pay | Admitting: Family Medicine

## 2022-04-13 ENCOUNTER — Telehealth (INDEPENDENT_AMBULATORY_CARE_PROVIDER_SITE_OTHER): Payer: Medicare HMO | Admitting: Family Medicine

## 2022-04-13 VITALS — Ht 59.0 in | Wt 157.6 lb

## 2022-04-13 DIAGNOSIS — R7301 Impaired fasting glucose: Secondary | ICD-10-CM | POA: Diagnosis not present

## 2022-04-13 DIAGNOSIS — E669 Obesity, unspecified: Secondary | ICD-10-CM

## 2022-04-13 DIAGNOSIS — Z6831 Body mass index (BMI) 31.0-31.9, adult: Secondary | ICD-10-CM

## 2022-04-13 DIAGNOSIS — K5903 Drug induced constipation: Secondary | ICD-10-CM | POA: Diagnosis not present

## 2022-04-13 NOTE — Telephone Encounter (Signed)
Please advise 

## 2022-04-23 NOTE — Progress Notes (Unsigned)
[facility] [address], [place], [place] [address] [phone]  Date:  04/27/2022   Name:  Laura Bush   DOB:  07-21-1951   MRN:  [id number]  PCP:  Darreld Mclean, MD    Chief Complaint: No chief complaint on file.   History of Present Illness:  Laura Bush is a 71 y.o. very pleasant female patient who presents with the following:  Patient seen today for periodic follow-up Most recent visit with myself was in June-from that visit that visit: hypertension, venous insufficiency, GERD, hypercholesterolemia, and morbid obesity,  breast cancer diagnosed 2016 followed by oncology, hemochromatosis being managed by hematology  Unfortunately she also recently suffered a pathologic fracture of the right femur, has been diagnosed with primary lung cancer  At her last visit I advised her okay to start back on hydrochlorothiazide, this has been held in the hospital Her hematologist is Dr. Marin Olp, her radiation oncologist is Dr. Sondra Come Most recent visit with Dr. 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   Vitamin D deficiency 01/29/2016   Eczema 12/29/2014   Venous insufficiency 10/16/2014   Essential hypertension 10/01/2014   Hypercholesteremia 10/01/2014   GERD (gastroesophageal reflux disease) 10/01/2014   Other bilateral bundle branch block 06/23/2003    Past Medical History:  Diagnosis Date   Asthma    in cold weather   Breast cancer ([place]) 09/18/2014   left breast   Breast cancer ([place]) 10/07/2014   bx left breast   Breast cancer of upper-outer quadrant of left female breast ([place]) 09/22/2014   Cluster headaches    in her 23's   GERD (gastroesophageal reflux disease)    Goals of care, counseling/discussion 01/14/2022   Heart murmur    as a child (has outgrown)   High cholesterol    History of radiation therapy    Left lung, Right Femur- 02/16/22-03/04/22- Dr. 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"   Swallowing difficulty    Venous insufficiency     Past Surgical  History:  Procedure Laterality Date   ABDOMINAL HYSTERECTOMY  1999   APPENDECTOMY  ~ 2006   BONE BIOPSY Right 01/04/2022   Procedure: RIGHT FEMORAL BONE BIOPSY;  Surgeon: Altamese Fanwood, MD;  Location: [facility];  Service: Orthopedics;  Laterality: Right;   BREAST BIOPSY Left 10/2014   BREAST LUMPECTOMY Left 2016   BREAST REDUCTION SURGERY Bilateral 11/11/2014   Procedure: Bilateral Breast Reduction;  Surgeon: Crissie Reese, MD;  Location: [facility];  Service: Plastics;  Laterality:  Bilateral;   BRONCHIAL BIOPSY  01/06/2022   Procedure: BRONCHIAL BIOPSIES;  Surgeon: Collene Gobble, MD;  Location[facility] ENDOSCOPY;  Service: Pulmonary;;   BRONCHIAL BRUSHINGS  01/06/2022   Procedure: BRONCHIAL BRUSHINGS;  Surgeon: Collene Gobble, MD;  Location: [facility] ENDOSCOPY;  Service: Pulmonary;;   BRONCHIAL NEEDLE ASPIRATION BIOPSY  01/06/2022   Procedure: BRONCHIAL NEEDLE ASPIRATION BIOPSIES;  Surgeon: Collene Gobble, MD;  Location: [facility];  Service: Pulmonary;;   BRONCHIAL WASHINGS  01/06/2022   Procedure: BRONCHIAL WASHINGS;  Surgeon: Collene Gobble, MD;  Location: [facility] ENDOSCOPY;  Service: Pulmonary;;   CARPAL TUNNEL RELEASE Bilateral    2 surgeries on right, 1 on left   CESAREAN SECTION  1978; [facility] IM NAIL Right 01/04/2022   Procedure: RIGHT FEMORAL INTRAMEDULLARY (IM) NAIL;  Surgeon: Altamese [place], MD;  Location: [facility];  Service: Orthopedics;  Laterality: Right;   FIDUCIAL MARKER PLACEMENT  01/06/2022   Procedure: FIDUCIAL MARKER PLACEMENT;  Surgeon: Collene Gobble, MD;  Location: [facility] ENDOSCOPY;  Service: Pulmonary;;   FOREARM FRACTURE SURGERY Right 2003 X 3   KNEE ARTHROSCOPY Bilateral    meniscus repair   PARTIAL MASTECTOMY WITH NEEDLE LOCALIZATION AND AXILLARY SENTINEL LYMPH NODE BX Left 11/11/2014   Procedure: LEFT BREAST PARTIAL MASTECTOMY WITH NEEDLE LOCALIZATION TIMES TWO AND LEFT AXILLARY SENTINEL LYMPH NODE Biopsy;  Surgeon: Fanny Skates, MD;  Location: [facility];  Service: General;  Laterality: Left;   SQUAMOUS CELL CARCINOMA EXCISION  1980's X 1   "nose"   TONSILLECTOMY  ~ 1957   TUBAL LIGATION  ?1984    Social History   Tobacco Use   Smoking status: Never   Smokeless tobacco: Never  Vaping Use   Vaping Use: Never used  Substance Use Topics   Alcohol use: Yes    Alcohol/week: 2.0 standard drinks of alcohol    Types: 2 Glasses of wine per week    Comment: 2 glasses a week   Drug use: No    Family History  Problem Relation Age of Onset    Dementia Mother        Lives in [place]   Hypertension Mother    Hyperlipidemia Mother    Stroke Father        deceased   Heart failure Father    Hypertension Father    Uterine cancer Sister 19   Heart attack Maternal Grandmother    Hypertension Maternal Grandfather    Stroke Paternal Grandfather    Colon cancer Neg Hx    Esophageal cancer Neg Hx    Pancreatic cancer Neg Hx    Stomach cancer Neg Hx    Liver disease Neg Hx    Sleep apnea Neg Hx     Allergies  Allergen Reactions   Diclofenac Hypertension   Amlodipine Swelling and Other (See Comments)    Limbs swell, not the throat   Lanolin Other (See Comments)    Sneezing and watery eyes. Allergic to  Wool.   Tape Other (See Comments)    Redness (Bandaids also)    Medication list has been reviewed and updated.  Current Outpatient Medications on File Prior to Visit  Medication Sig Dispense Refill   acetaminophen (TYLENOL) 500 MG tablet Take 1 tablet (500 mg total) by mouth every 8 (eight) hours as needed for mild pain or moderate pain. 30 tablet 0   alectinib (ALECENSA) 150 MG capsule Take 4 capsules (600 mg total) by mouth 2 (two) times daily with a meal. 240 capsule 6   aMILoride (MIDAMOR) 5 MG tablet Take 1 tablet (5 mg total) by mouth daily. (Patient taking differently: Take 5 mg by mouth in the morning.) 90 tablet 3   aspirin EC 81 MG tablet Take 81 mg by mouth daily. Swallow whole.     B Complex-C (SUPER B COMPLEX PO) Take 1 tablet by mouth daily with breakfast.     Calcium Carb-Cholecalciferol (CALCIUM + D3 PO) Take 1 tablet by mouth daily.     cholecalciferol (VITAMIN D3) 25 MCG (1000 UNIT) tablet Take 1,000 Units by mouth daily.     diphenhydrAMINE (BENADRYL) 25 mg capsule Take 25 mg by mouth at bedtime as needed.     fexofenadine (ALLEGRA) 180 MG tablet Take 180 mg by mouth in the morning.     fluticasone (CUTIVATE) 0.05 % cream Apply 1 application. topically See admin instructions. Apply to dry area of the left  thigh once a day     ipratropium (ATROVENT) 0.06 % nasal spray Place 2 sprays into both nostrils 4 (four) times daily. Use as needed (Patient taking differently: Place 2 sprays into both nostrils 4 (four) times daily as needed for rhinitis.) 15 mL 4   Lactulose 20 GM/30ML SOLN Take 30 mLs (20 g total) by mouth every 2 (two) hours as needed. Take q2h until BM as needed for severe constipation. 450 mL 1   methocarbamol (ROBAXIN) 500 MG tablet Take 1 tablet (500 mg total) by mouth every 6 (six) hours as needed for muscle spasms. 30 tablet 0   NIFEdipine (PROCARDIA-XL/NIFEDICAL-XL) 30 MG 24 hr tablet TAKE ONE TABLET BY MOUTH DAILY 90 tablet 1   pantoprazole (PROTONIX) 40 MG tablet Take 1 tablet (40 mg total) by mouth daily. (Patient taking differently: Take 40 mg by mouth daily before breakfast.) 90 tablet 3   polyethylene glycol (MIRALAX / GLYCOLAX) 17 g packet Take 17 g by mouth daily.     prochlorperazine (COMPAZINE) 10 MG tablet Take 1 tablet (10 mg total) by mouth every 6 (six) hours as needed for nausea or vomiting. 60 tablet 0   Semaglutide, 2 MG/DOSE, (OZEMPIC, 2 MG/DOSE,) 8 MG/3ML SOPN Inject 2 mg into the skin once a week. (Patient taking differently: Inject 2 mg into the skin every Saturday.) 9 mL 0   simvastatin (ZOCOR) 20 MG tablet TAKE 1 TABLET BY MOUTH EVERY DAY 90 tablet 3   traZODone (DESYREL) 50 MG tablet Take 1 tablet (50 mg total) by mouth at bedtime as needed for sleep. 30 tablet 3   No current facility-administered medications on file prior to visit.    Review of Systems:  As per HPI- otherwise negative.   Physical Examination: There were no vitals filed for this visit. There were no vitals filed for this visit. There is no height or weight on file to calculate BMI. Ideal Body Weight:    GEN: no acute distress. HEENT: Atraumatic, Normocephalic.  Ears and Nose: No external deformity. CV: RRR, No  M/G/R. No JVD. No thrill. No extra heart sounds. PULM: CTA B, no wheezes,  crackles, rhonchi. No retractions. No resp. distress. No accessory muscle use. ABD: S, NT, ND, +BS. No rebound. No HSM. EXTR: No c/c/e PSYCH: Normally interactive. Conversant.    Assessment and Plan: ***  Signed Lamar Blinks, MD

## 2022-04-25 ENCOUNTER — Encounter: Payer: Self-pay | Admitting: *Deleted

## 2022-04-25 ENCOUNTER — Ambulatory Visit: Payer: Medicare HMO | Admitting: Family Medicine

## 2022-04-27 ENCOUNTER — Ambulatory Visit (HOSPITAL_BASED_OUTPATIENT_CLINIC_OR_DEPARTMENT_OTHER)
Admission: RE | Admit: 2022-04-27 | Discharge: 2022-04-27 | Disposition: A | Discharge status: Home / Self Care | Payer: Medicare HMO | Source: Ambulatory Visit | Attending: Family Medicine | Admitting: Family Medicine

## 2022-04-27 ENCOUNTER — Ambulatory Visit (INDEPENDENT_AMBULATORY_CARE_PROVIDER_SITE_OTHER): Payer: Medicare HMO | Admitting: Family Medicine

## 2022-04-27 ENCOUNTER — Other Ambulatory Visit (HOSPITAL_BASED_OUTPATIENT_CLINIC_OR_DEPARTMENT_OTHER): Payer: Self-pay

## 2022-04-27 VITALS — BP 122/70 | HR 69 | Temp 97.8°F | Resp 18 | Ht <= 58 in | Wt 162.6 lb

## 2022-04-27 DIAGNOSIS — L84 Corns and callosities: Secondary | ICD-10-CM | POA: Diagnosis not present

## 2022-04-27 DIAGNOSIS — C3490 Malignant neoplasm of unspecified part of unspecified bronchus or lung: Secondary | ICD-10-CM | POA: Diagnosis not present

## 2022-04-27 DIAGNOSIS — K5903 Drug induced constipation: Secondary | ICD-10-CM | POA: Diagnosis not present

## 2022-04-27 DIAGNOSIS — I10 Essential (primary) hypertension: Secondary | ICD-10-CM | POA: Diagnosis not present

## 2022-04-27 DIAGNOSIS — K8020 Calculus of gallbladder without cholecystitis without obstruction: Secondary | ICD-10-CM | POA: Diagnosis not present

## 2022-04-27 DIAGNOSIS — G4733 Obstructive sleep apnea (adult) (pediatric): Secondary | ICD-10-CM | POA: Diagnosis not present

## 2022-04-27 MED ORDER — BOOSTRIX 5-2.5-18.5 LF-MCG/0.5 IM SUSY
PREFILLED_SYRINGE | INTRAMUSCULAR | 0 refills | Status: DC
  Filled 2022-04-27: qty 0.5, 1d supply, fill #0

## 2022-04-27 NOTE — Patient Instructions (Addendum)
Good to see you looking so well today!  I think your plan for your immunizations makes sense If you want to stop ozempic at any time that is ok I ordered a plain film of your abdomen to check on your constipation- can be done at your convenience  For the callus/ corn on your foot- try a corn pad to help dissolve the corn Cushioned insoles may help

## 2022-04-28 ENCOUNTER — Ambulatory Visit: Payer: Medicare HMO | Admitting: Pharmacist

## 2022-04-28 ENCOUNTER — Encounter: Payer: Self-pay | Admitting: Family Medicine

## 2022-04-28 ENCOUNTER — Encounter: Payer: Self-pay | Admitting: Pharmacist

## 2022-04-28 DIAGNOSIS — R7301 Impaired fasting glucose: Secondary | ICD-10-CM

## 2022-04-28 DIAGNOSIS — K5903 Drug induced constipation: Secondary | ICD-10-CM

## 2022-04-28 NOTE — Patient Instructions (Signed)
Laura Bush It was great speaking with you again today.   Below the chart for Ozempic dosing based on "clicks" and also the information we discussed to help lower the risk of bruising with the Ozemepic injections.   As always if you have any questions or concerns especially regarding medications, please feel free to contact me either at the phone number below or with a MyChart message.   Keep up the good work!  Cherre Robins, PharmD Clinical Pharmacist [facility] [phone] (direct line)  [phone] (main office number)  Ozempic 8 mg/3 mL - Gold Color Pen Dose  # Clicks  2.95 mg 10 clicks  0.5 mg 19 clicks  1 mg 37 clicks  1.5 mg 56 clicks  2 mg 74 clicks    How to Decrease Injection Site Bruising  Why do people bruise at injection sites? When you take an injection, a bruise may typically appear because the small blood vessels under the skin are accidentally damaged, leaking their contents into the surrounding tissue.   It is perfectly normal to bruise from injections from time to time. Bruises from injections, while causing mild pain, are generally harmless and go away within a few days.    Ways to reduce bruising  1)Use longer needles  It may seem counterintuitive, but using shorter needles increases the likelihood that you will bruise. When available, opt for longer syringe needles that will bypass the surface blood vessels, thus reducing bruising.   2)Inject at a 90-degree angle  While it may be easier to inject at a slant, try and inject at a 90-degree angle instead. This will improve the ease at which the needle goes into the skin, reducing the incidence of bruising.   3)Change your syringes, pen needles and lancets more often Most syringes and lancets are meant to be single-use, which can be a hard regimen to remember and stick to. Changing out old lancets and only using syringes/ pen needles one time will ensure that the point at  the tip of the syringe stays sharp and is significantly less likely to bruise the user.   4)Rotate sites Remember to rotate your injection sites. Routinely using the same sites (and even general areas) for your injections will definitely cause bruising, and can even hasten the development of scar tissue, which can lead to malabsorption of insulin, leading to higher blood sugars.  Continuously rotating your sites can prevent all of this, including bruising!   5)Ice the area Ice the site where you will be giving an injection about 30 to 60 seconds before doing so. Ice helps to shrink the capillary blood vessels under your skin, reducing the likelihood of damage and bruising.   6)Avoid your belly button People love injecting insulin in their stomachs - there's lots of space and usually a good amount of fat, which is excellent for insulin absorption. However, injecting too close to your belly button will cause bruising and pain. Aim to avoid any insulin injections within an inch or two of your belly button.  7)Up your iron intake  People who suffer from anemia or low iron bruise much more easily. Your body needs iron to keep your blood cells oxygenated and healthy.  Without sufficient iron from sources like beans, lentils, tofu, dark leafy greens, or even a supplement, you will be more susceptible to bruising.    What to do if you develop a bruise after an injection If you develop a bruise after an injection,  fear not. Employ these helpful strategies to bring you quick relief:  If you feel pain immediately after injecting, apply gentle pressure on the site to help prevent the development of a bruise  Do not massage or rub the injection site, as this will make the bruise worse Treat the affected area with ice  Do not inject on or near the site until the bruise has healed completely If the pain is significant, treat it with ibuprofen to reduce inflammation  If you're struggling with chronic  bruising, contact your doctor and medical team

## 2022-04-28 NOTE — Progress Notes (Signed)
Pharmacy Note  04/28/2022 Name: Laura Bush MRN: [id number] DOB: 12/22/1950  Subjective: Laura Bush is a 71 y.o. year old female who is a primary care patient of Copland, Gay Filler, MD. Referred to Clinical Pharmacist Practitioner for medication management  Engaged with patient by telephone for follow up visit today.   Patient is being treated for lung cancer with Alecensa. She reports increase in constipation though she also had constipation with Ozempic 2mg  weekly. She is taking lactulose 20grams at night and also Miralax daily. She is not sure she still needs to take Ozempic as she had a decrease in appetite when she started Alecensa.  She also reports she has been getting bruising at injection site after using Ozempic. She his rotating sites between her thighs and abdomen.    Objective:  Lab Results  Component Value Date   CREATININE 1.19 (H) 04/06/2022   CREATININE 0.77 02/16/2022   CREATININE 0.78 01/27/2022    Lab Results  Component Value Date   HGBA1C 5.3 01/03/2022       Component Value Date/Time   CHOL 156 10/05/2020 1059   TRIG 182.0 (H) 10/05/2020 1059   HDL 54.30 10/05/2020 1059   CHOLHDL 3 10/05/2020 1059   VLDL 36.4 10/05/2020 1059   LDLCALC 66 10/05/2020 1059   LDLCALC 70 04/06/2020 1059     BP Readings from Last 3 Encounters:  04/27/22 122/70  04/07/22 104/65  04/06/22 112/69    Care Plan  Allergies  Allergen Reactions   Diclofenac Hypertension   Amlodipine Swelling and Other (See Comments)    Limbs swell, not the throat   Lanolin Other (See Comments)    Sneezing and watery eyes. Allergic to [facility].   Tape Other (See Comments)    Redness (Bandaids also)    Medications Reviewed Today     Reviewed by Cherre Robins, RPH-CPP (Pharmacist) on 04/28/22 at 1053  Med List Status: <None>   Medication Order Taking? Sig Documenting Provider Last Dose Status Informant  acetaminophen (TYLENOL) 500 MG tablet [id number] Yes Take 1  tablet (500 mg total) by mouth every 8 (eight) hours as needed for mild pain or moderate pain. Ainsley Spinner, PA-C Taking Active   alectinib (ALECENSA) 150 MG capsule [id number] Yes Take 4 capsules (600 mg total) by mouth 2 (two) times daily with a meal. Volanda Napoleon, MD Taking Active   aMILoride [facility]) 5 MG tablet [id number] Yes Take 1 tablet (5 mg total) by mouth daily.  Patient taking differently: Take 5 mg by mouth in the morning.   Copland, Gay Filler, MD Taking Active Multiple Informants  aspirin EC 81 MG tablet [id number] Yes Take 81 mg by mouth daily. Swallow whole. [provider] Taking Active Multiple Informants  B Complex-C (SUPER B COMPLEX PO) [id number] Yes Take 1 tablet by mouth daily with breakfast. [provider] Taking Active Multiple Informants  Calcium Carb-Cholecalciferol (CALCIUM + D3 PO) [id number] Yes Take 1 tablet by mouth daily. [provider] Taking Active Multiple Informants  cholecalciferol (VITAMIN D3) 25 MCG (1000 UNIT) tablet [id number] Yes Take 1,000 Units by mouth daily. [provider] Taking Active   diphenhydrAMINE (BENADRYL) 25 mg capsule [id number] Yes Take 25 mg by mouth at bedtime as needed. [provider] Taking Active   fexofenadine (ALLEGRA) 180 MG tablet [id number] Yes Take 180 mg by mouth in the morning. [provider] Taking Active Multiple Informants  fluticasone (CUTIVATE) 0.05 % cream [id number] Yes Apply 1 application. topically See admin  instructions. Apply to dry area of the left thigh once a day [provider] Taking Active Multiple Informants  ipratropium (ATROVENT) 0.06 % nasal spray [id number] Yes Place 2 sprays into both nostrils 4 (four) times daily. Use as needed  Patient taking differently: Place 2 sprays into both nostrils 4 (four) times daily as needed for rhinitis.   Copland, Gay Filler, MD Taking Active Multiple Informants  Lactulose 20 GM/30ML SOLN [id number] Yes Take 30 mLs  (20 g total) by mouth every 2 (two) hours as needed. Take q2h until BM as needed for severe constipation. Volanda Napoleon, MD Taking Active   methocarbamol (ROBAXIN) 500 MG tablet [id number] Yes Take 1 tablet (500 mg total) by mouth every 6 (six) hours as needed for muscle spasms. Ainsley Spinner, PA-C Taking Active   NIFEdipine (PROCARDIA-XL/NIFEDICAL-XL) 30 MG 24 hr tablet [id number] Yes TAKE ONE TABLET BY MOUTH DAILY Copland, Gay Filler, MD Taking Active   pantoprazole (PROTONIX) 40 MG tablet [id number] Yes Take 1 tablet (40 mg total) by mouth daily.  Patient taking differently: Take 40 mg by mouth daily before breakfast.   Copland, Gay Filler, MD Taking Active Multiple Informants  polyethylene glycol (MIRALAX / GLYCOLAX) 17 g packet [id number] Yes Take 17 g by mouth daily. [provider] Taking Active   prochlorperazine (COMPAZINE) 10 MG tablet [id number] Yes Take 1 tablet (10 mg total) by mouth every 6 (six) hours as needed for nausea or vomiting. Volanda Napoleon, MD Taking Active   Semaglutide, 2 MG/DOSE, (OZEMPIC, 2 MG/DOSE,) 8 MG/3ML SOPN [id number] Yes Inject 2 mg into the skin once a week.  Patient taking differently: Inject 2 mg into the skin every Saturday.   Mellody Dance, DO Taking Active Multiple Informants  simvastatin (ZOCOR) 20 MG tablet [id number] Yes TAKE 1 TABLET BY MOUTH EVERY DAY Copland, Gay Filler, MD Taking Active   Tdap ([place]) 5-2.5-18.5 LF-MCG/0.5 injection [id number] Yes Inject into the muscle. Carlyle Basques, MD Taking Active   traZODone (DESYREL) 50 MG tablet [id number] Yes Take 1 tablet (50 mg total) by mouth at bedtime as needed for sleep. 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 ([place]) 01/14/2022   Goals of care, counseling/discussion 01/14/2022   Laceration of  left eyebrow 01/04/2022   Mass of lower lobe of left lung 01/04/2022   Aortic atherosclerosis ([place]) 01/04/2022   CAD (coronary artery disease) 01/04/2022   Pathologic fracture of femur ([place]) 01/03/2022   OSA on CPAP 01/03/2022   Impaired fasting glucose, with polyphagia 12/15/2021   Hemochromatosis 01/21/2021   Obesity (BMI 30-39.9) 95/04/3266   Metabolic syndrome 12/45/8099   Mixed hyperlipidemia 04/21/2020   Polyp of ascending colon 04/21/2020   History of breast cancer, Tamoxifen, nearing five years, Dr. Griffith Citron 04/21/2020   Osteopenia 10/02/2018   Malignant 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, estrogen receptor positive ([place]) 05/29/2017   Ductal carcinoma in situ (DCIS) of right breast 05/29/2017   Vitamin D deficiency 01/29/2016   Eczema 12/29/2014   Venous insufficiency 10/16/2014   Essential hypertension 10/01/2014   Hypercholesteremia 10/01/2014   GERD (gastroesophageal reflux disease) 10/01/2014   Other bilateral bundle branch block 06/23/2003   Assessment:  Metabolic syndrome - stable Constipation - possible related to ozempic or Alecensa; lactulose  and Miralax only helping a little - abdominal imaging pending Injection site bruising  Plan:  Discussed that Alecensa can cause anemia which might be why she is seeing increase in bruising.  Provided tips on how to decrease bruising at injection site (see patient notes) Discussed trial of lowering dose of Ozempic to see if she still needs it or if she could lower dose. She will lower to 1mg  by using "clicks system"; Wil try injection 37 clicks = 1mg  weekly.   Cherre Robins, PharmD Clinical Pharmacist [facility]

## 2022-04-29 ENCOUNTER — Encounter: Payer: Self-pay | Admitting: Family Medicine

## 2022-05-03 ENCOUNTER — Encounter: Payer: Self-pay | Admitting: *Deleted

## 2022-05-03 ENCOUNTER — Encounter: Payer: Self-pay | Admitting: Family Medicine

## 2022-05-07 DIAGNOSIS — I700 Atherosclerosis of aorta: Secondary | ICD-10-CM | POA: Diagnosis not present

## 2022-05-07 DIAGNOSIS — G4733 Obstructive sleep apnea (adult) (pediatric): Secondary | ICD-10-CM | POA: Diagnosis not present

## 2022-05-07 DIAGNOSIS — R918 Other nonspecific abnormal finding of lung field: Secondary | ICD-10-CM | POA: Diagnosis not present

## 2022-05-07 DIAGNOSIS — M84453A Pathological fracture, unspecified femur, initial encounter for fracture: Secondary | ICD-10-CM | POA: Diagnosis not present

## 2022-05-09 NOTE — Progress Notes (Signed)
TeleHealth Visit:  This visit was completed with telemedicine (audio/video) technology. Laura Bush has verbally consented to this TeleHealth visit. The patient is located at home, the provider is located at home. The participants in this visit include the listed provider and patient. The visit was conducted today via MyChart video.  OBESITY Laura Bush is here to discuss her progress with her obesity treatment plan along with follow-up of her obesity related diagnoses.   Today's visit was # 27  Starting weight: 196 lbs Starting date: 10/17/2019 Weight reported at last virtual office visit: 157.6 lbs on 04/13/22 Today's reported weight: No weight reported.  Nutrition Plan: the Category 2 Plan and the [place] with chicken  Current exercise: none   Interim History: Laura Bush is battling stage IV lung cancer and feels that she wants to take a hiatus from the program to focus on dealing with her cancer and side effects from treatment.  However, she wants to stay active in the program so she does not have to go on the wait list should she decide to come back to the clinic She notices dysgeusia and severe fatigue.  Has also been dealing with constipation that is well controlled currently. Water intake is adequate.  She is focusing on protein intake.  She is going to [place] the end of this month to [place].  Assessment/Plan:  1. Constipation Constipation has been well controlled over the past week with MiraLAX daily and a serving of raisin bran daily.  HHas not needed the lactulose. Currently on Ozempic 2 mg daily and Alecensa which can both cause constipation.  Plan: Discontinue Ozempic. Increase fiber and water. Continue MiraLAX once daily.  May take twice a day or every other day based on results. Continue raisin bran daily.  2.  Leg edema, right Notes swelling in the right leg.  She is status post radiation for pathologic fracture right femur due to bone  metastasis.  Placed on HCTZ 25 mg daily by Dr. Marin Olp which seems to help.  She is limiting salt-says she has always been salt sensitive.  Plan: Encouraged her to asked Dr. Marin Olp about physical therapy for the edema and compression stocking.   3. Obesity: Current BMI 33.9 Laura Bush is currently in the action stage of change. As such, her goal is to maintain weight for now.  She has agreed to practicing portion control and making smarter food choices, such as increasing vegetables and decreasing simple carbohydrates.   Focus on protein intake at all meals, decreasing simple carbohydrates and processed foods, increasing fruits and vegetables, increasing water.  Exercise goals: none. Activity as tolerated.  Behavioral modification strategies: increasing lean protein intake, decreasing simple carbohydrates, increasing vegetables, and increasing water intake.  Laura Bush has agreed to follow-up with our clinic in 5 months. She will schedule a virtual visit in March to remain active in the program.  No orders of the defined types were placed in this encounter.   Medications Discontinued During This Encounter  Medication Reason   Semaglutide, 2 MG/DOSE, (OZEMPIC, 2 MG/DOSE,) 8 MG/3ML SOPN Discontinued by provider     No orders of the defined types were placed in this encounter.     Objective:   VITALS: Per patient if applicable, see vitals. GENERAL: Alert and in no acute distress. CARDIOPULMONARY: No increased WOB. Speaking in clear sentences.  PSYCH: Pleasant and cooperative. Speech normal rate and rhythm. Affect is appropriate. Insight and judgement are appropriate. Attention is focused, linear, and appropriate.  NEURO: Oriented as  arrived to appointment on time with no prompting.   Lab Results  Component Value Date   CREATININE 1.19 (H) 04/06/2022   BUN 25 (H) 04/06/2022   NA 140 04/06/2022   K 3.8 04/06/2022   CL 101 04/06/2022   CO2 31 04/06/2022   Lab Results  Component  Value Date   ALT 26 04/06/2022   AST 29 04/06/2022   ALKPHOS 104 04/06/2022   BILITOT 0.7 04/06/2022   Lab Results  Component Value Date   HGBA1C 5.3 01/03/2022   HGBA1C 5.2 04/06/2020   HGBA1C 5.6 04/03/2019   HGBA1C 5.6 03/28/2018   Lab Results  Component Value Date   INSULIN 12.6 10/17/2019   Lab Results  Component Value Date   TSH 0.88 10/05/2020   Lab Results  Component Value Date   CHOL 156 10/05/2020   HDL 54.30 10/05/2020   LDLCALC 66 10/05/2020   TRIG 182.0 (H) 10/05/2020   CHOLHDL 3 10/05/2020   Lab Results  Component Value Date   WBC 4.1 04/06/2022   HGB 12.9 04/06/2022   HCT 37.1 04/06/2022   MCV 94.9 04/06/2022   PLT 161 04/06/2022   Lab Results  Component Value Date   IRON 91 02/16/2022   TIBC 407 02/16/2022   FERRITIN 28 02/16/2022   Lab Results  Component Value Date   VD25OH 32.61 01/05/2022   VD25OH 44.11 10/05/2020   VD25OH 38.52 04/03/2019    Attestation Statements:   Reviewed by clinician on day of visit: allergies, medications, problem list, medical history, surgical history, family history, social history, and previous encounter notes.  Time spent on visit including the items listed below was 35 minutes.  -preparing to see the patient (e.g., review of tests, history, previous notes) -obtaining and/or reviewing separately obtained history -counseling and educating the patient/family/caregiver -documenting clinical information in the electronic or other health record

## 2022-05-10 ENCOUNTER — Telehealth (INDEPENDENT_AMBULATORY_CARE_PROVIDER_SITE_OTHER): Payer: Medicare HMO | Admitting: Family Medicine

## 2022-05-10 ENCOUNTER — Encounter (INDEPENDENT_AMBULATORY_CARE_PROVIDER_SITE_OTHER): Payer: Self-pay | Admitting: Family Medicine

## 2022-05-10 ENCOUNTER — Encounter: Payer: Self-pay | Admitting: *Deleted

## 2022-05-10 DIAGNOSIS — E669 Obesity, unspecified: Secondary | ICD-10-CM

## 2022-05-10 DIAGNOSIS — C3490 Malignant neoplasm of unspecified part of unspecified bronchus or lung: Secondary | ICD-10-CM

## 2022-05-10 DIAGNOSIS — R600 Localized edema: Secondary | ICD-10-CM | POA: Diagnosis not present

## 2022-05-10 DIAGNOSIS — Z6833 Body mass index (BMI) 33.0-33.9, adult: Secondary | ICD-10-CM | POA: Diagnosis not present

## 2022-05-10 DIAGNOSIS — K5903 Drug induced constipation: Secondary | ICD-10-CM | POA: Diagnosis not present

## 2022-05-10 NOTE — Progress Notes (Signed)
See MyChart message dated 05/10/2022. Patient scheduled for labs this Friday. Orders placed.   Oncology Nurse Navigator Documentation     05/10/2022    1:30 PM  Oncology Nurse Navigator Flowsheets  Navigator Follow Up Date: 05/18/2022  Navigator Follow Up Reason: Follow-up Appointment  Navigator Location [facility]  Navigator Encounter Type MyChart  Patient Visit Type MedOnc  Treatment Phase Active Tx  Barriers/Navigation Needs Coordination of Care;Education  Interventions Coordination of Care  Acuity Level 2-Minimal Needs (1-2 Barriers Identified)  Education Method Written  Support Groups/Services Friends and Family  Time Spent with Patient 15

## 2022-05-12 ENCOUNTER — Telehealth (INDEPENDENT_AMBULATORY_CARE_PROVIDER_SITE_OTHER): Payer: Medicare HMO | Admitting: Family Medicine

## 2022-05-13 ENCOUNTER — Inpatient Hospital Stay: Payer: Medicare HMO | Attending: Hematology & Oncology

## 2022-05-13 ENCOUNTER — Other Ambulatory Visit (HOSPITAL_BASED_OUTPATIENT_CLINIC_OR_DEPARTMENT_OTHER): Payer: Self-pay

## 2022-05-13 DIAGNOSIS — C3432 Malignant neoplasm of lower lobe, left bronchus or lung: Secondary | ICD-10-CM | POA: Diagnosis not present

## 2022-05-13 DIAGNOSIS — C7951 Secondary malignant neoplasm of bone: Secondary | ICD-10-CM | POA: Diagnosis not present

## 2022-05-13 DIAGNOSIS — C3490 Malignant neoplasm of unspecified part of unspecified bronchus or lung: Secondary | ICD-10-CM

## 2022-05-13 DIAGNOSIS — R0602 Shortness of breath: Secondary | ICD-10-CM | POA: Insufficient documentation

## 2022-05-13 DIAGNOSIS — Z79899 Other long term (current) drug therapy: Secondary | ICD-10-CM | POA: Diagnosis not present

## 2022-05-13 LAB — CBC WITH DIFFERENTIAL (CANCER CENTER ONLY)
Abs Immature Granulocytes: 0.03 10*3/uL (ref 0.00–0.07)
Basophils Absolute: 0 10*3/uL (ref 0.0–0.1)
Basophils Relative: 1 %
Eosinophils Absolute: 0.1 10*3/uL (ref 0.0–0.5)
Eosinophils Relative: 4 %
HCT: 36.2 % (ref 36.0–46.0)
Hemoglobin: 12.6 g/dL (ref 12.0–15.0)
Immature Granulocytes: 1 %
Lymphocytes Relative: 33 %
Lymphs Abs: 1.2 10*3/uL (ref 0.7–4.0)
MCH: 32.7 pg (ref 26.0–34.0)
MCHC: 34.8 g/dL (ref 30.0–36.0)
MCV: 94 fL (ref 80.0–100.0)
Monocytes Absolute: 0.4 10*3/uL (ref 0.1–1.0)
Monocytes Relative: 12 %
Neutro Abs: 1.8 10*3/uL (ref 1.7–7.7)
Neutrophils Relative %: 49 %
Platelet Count: 147 10*3/uL — ABNORMAL LOW (ref 150–400)
RBC: 3.85 MIL/uL — ABNORMAL LOW (ref 3.87–5.11)
RDW: 13.9 % (ref 11.5–15.5)
WBC Count: 3.6 10*3/uL — ABNORMAL LOW (ref 4.0–10.5)
nRBC: 0 % (ref 0.0–0.2)

## 2022-05-13 LAB — CMP (CANCER CENTER ONLY)
ALT: 24 U/L (ref 0–44)
AST: 31 U/L (ref 15–41)
Albumin: 4.2 g/dL (ref 3.5–5.0)
Alkaline Phosphatase: 139 U/L — ABNORMAL HIGH (ref 38–126)
Anion gap: 6 (ref 5–15)
BUN: 21 mg/dL (ref 8–23)
CO2: 28 mmol/L (ref 22–32)
Calcium: 9 mg/dL (ref 8.9–10.3)
Chloride: 102 mmol/L (ref 98–111)
Creatinine: 1 mg/dL (ref 0.44–1.00)
GFR, Estimated: >60 mL/min (ref >60)
Glucose, Bld: 90 mg/dL (ref 70–99)
Potassium: 4.7 mmol/L (ref 3.5–5.1)
Sodium: 136 mmol/L (ref 135–145)
Total Bilirubin: 1 mg/dL (ref 0.3–1.2)
Total Protein: 6.9 g/dL (ref 6.5–8.1)

## 2022-05-13 MED ORDER — FLUAD QUADRIVALENT 0.5 ML IM PRSY
PREFILLED_SYRINGE | INTRAMUSCULAR | 0 refills | Status: DC
  Filled 2022-05-13: qty 0.5, 1d supply, fill #0

## 2022-05-18 ENCOUNTER — Inpatient Hospital Stay: Payer: Medicare HMO

## 2022-05-18 ENCOUNTER — Encounter: Payer: Self-pay | Admitting: Hematology & Oncology

## 2022-05-18 ENCOUNTER — Encounter: Payer: Self-pay | Admitting: *Deleted

## 2022-05-18 ENCOUNTER — Inpatient Hospital Stay: Payer: Medicare HMO | Admitting: Hematology & Oncology

## 2022-05-18 ENCOUNTER — Other Ambulatory Visit: Payer: Self-pay

## 2022-05-18 VITALS — BP 126/66 | HR 66 | Temp 97.9°F | Resp 17 | Ht <= 58 in | Wt 163.0 lb

## 2022-05-18 DIAGNOSIS — Z79899 Other long term (current) drug therapy: Secondary | ICD-10-CM | POA: Diagnosis not present

## 2022-05-18 DIAGNOSIS — C3490 Malignant neoplasm of unspecified part of unspecified bronchus or lung: Secondary | ICD-10-CM | POA: Diagnosis not present

## 2022-05-18 DIAGNOSIS — C7951 Secondary malignant neoplasm of bone: Secondary | ICD-10-CM

## 2022-05-18 DIAGNOSIS — C3432 Malignant neoplasm of lower lobe, left bronchus or lung: Secondary | ICD-10-CM | POA: Diagnosis not present

## 2022-05-18 DIAGNOSIS — R0602 Shortness of breath: Secondary | ICD-10-CM | POA: Diagnosis not present

## 2022-05-18 NOTE — Progress Notes (Signed)
Hematology and Oncology Follow Up Visit  Laura Bush [id number] [birth year] 71 y.o. 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 3 months  -next dose in 06/27/2022  S/P SBRT to LUL -- completed on 03/04/2022 S/P XRT to RIGHT femur -- completed on 03/04/2022 Alecensa 600 mg po BID -- start on 03/24/2022     Interim History:  [name] is back for follow-up.  Overall, I think she is doing pretty well with the Alecensa.  She has little bit of a pain in the back in the right hip area.  She has had surgery on the right hip.  She has little bit of radiating pain down the right hip.  She stopped the Ozempic.  She just did not want to deal with any of the side effects.  She was having constipation with this.  That she had an episode of shortness of breath.  This did not last all that long.  She has had no fever.  She has had no bleeding.  She has had no rashes.  Patient also has noted some swelling in her knees.  She has had no headache.  Her appetite has been good.  Again again she is trying to lose weight so she is watching what she eats.  She is worried about being dehydrated.  I told her she can try Pedialyte or maybe Gatorade.  Overall, I would say that her performance status is probably ECOG 1.      Medications:  Current Outpatient Medications:    hydrochlorothiazide (HYDRODIURIL) 25 MG tablet, Take 25 mg by mouth daily., Disp: , Rfl:    acetaminophen (TYLENOL) 500 MG tablet, Take 1 tablet (500 mg total) by mouth every 8 (eight) hours as needed for mild pain or moderate pain., Disp: 30 tablet, Rfl: 0   alectinib (ALECENSA) 150 MG capsule, Take 4 capsules (600 mg total) by mouth 2 (two) times daily with a meal., Disp: 240 capsule, Rfl: 6   aMILoride (MIDAMOR) 5 MG tablet, Take 1 tablet (5 mg total)  by mouth daily. (Patient taking differently: Take 5 mg by mouth in the morning.), Disp: 90 tablet, Rfl: 3   aspirin EC 81 MG tablet, Take 81 mg by mouth daily. Swallow whole., Disp: , Rfl:    B Complex-C (SUPER B COMPLEX PO), Take 1 tablet by mouth daily with breakfast., Disp: , Rfl:    Calcium Carb-Cholecalciferol (CALCIUM + D3 PO), Take 1 tablet by mouth daily., Disp: , Rfl:    cholecalciferol (VITAMIN D3) 25 MCG (1000 UNIT) tablet, Take 1,000 Units by mouth daily., Disp: , Rfl:    diphenhydrAMINE (BENADRYL) 25 mg capsule, Take 25 mg by mouth at bedtime as needed., Disp: , Rfl:    fexofenadine (ALLEGRA) 180 MG tablet, Take 180 mg by mouth in the morning., Disp: , Rfl:    influenza vaccine adjuvanted (FLUAD QUADRIVALENT) 0.5 ML injection, Inject into the muscle., Disp: 0.5 mL, Rfl: 0   ipratropium (ATROVENT) 0.06 % nasal spray, Place 2 sprays into both nostrils 4 (four) times daily. Use as needed (Patient taking differently: Place 2 sprays into both nostrils 4 (four) times daily as needed for rhinitis.), Disp: 15 mL, Rfl: 4   Lactulose 20 GM/30ML SOLN, Take 30 mLs (20 g total) by mouth every 2 (two) hours as needed. Take q2h  until BM as needed for severe constipation., Disp: 450 mL, Rfl: 1   NIFEdipine (PROCARDIA-XL/NIFEDICAL-XL) 30 MG 24 hr tablet, TAKE ONE TABLET BY MOUTH DAILY, Disp: 90 tablet, Rfl: 1   pantoprazole (PROTONIX) 40 MG tablet, Take 1 tablet (40 mg total) by mouth daily. (Patient taking differently: Take 40 mg by mouth daily before breakfast.), Disp: 90 tablet, Rfl: 3   polyethylene glycol (MIRALAX / GLYCOLAX) 17 g packet, Take 17 g by mouth daily., Disp: , Rfl:    simvastatin (ZOCOR) 20 MG tablet, TAKE 1 TABLET BY MOUTH EVERY DAY, Disp: 90 tablet, Rfl: 3   Tdap (BOOSTRIX) 5-2.5-18.5 LF-MCG/0.5 injection, Inject into the muscle., Disp: 0.5 mL, Rfl: 0   traZODone (DESYREL) 50 MG tablet, Take 1 tablet (50 mg total) by mouth at bedtime as needed for sleep., Disp: 30 tablet, Rfl:  3  Allergies:  Allergies  Allergen Reactions   Diclofenac Hypertension   Amlodipine Swelling and Other (See Comments)    Limbs swell, not the throat   Lanolin Other (See Comments)    Sneezing and watery eyes. Allergic to [facility].   Tape Other (See Comments)    Redness (Bandaids also)    Past Medical History, Surgical history, Social history, and Family History were reviewed and updated.  Review of Systems: Review of Systems  Constitutional: Negative.   HENT:  Negative.    Eyes: Negative.   Respiratory: Negative.    Cardiovascular: Negative.   Gastrointestinal: Negative.   Endocrine: Negative.   Genitourinary: Negative.    Musculoskeletal:  Positive for arthralgias.  Skin: Negative.   Neurological: Negative.   Hematological: Negative.   Psychiatric/Behavioral: Negative.      Physical Exam:  height is 4' 10" (1.473 m) and weight is 163 lb (73.9 kg). Her oral temperature is 97.9 F (36.6 C). Her blood pressure is 126/66 and her pulse is 66. Her respiration is 17 and oxygen saturation is 100%.   Wt Readings from Last 3 Encounters:  05/18/22 163 lb (73.9 kg)  04/27/22 162 lb 9.6 oz (73.8 kg)  04/13/22 157 lb 9.6 oz (71.5 kg)    Physical Exam Vitals reviewed.  HENT:     Head: Normocephalic and atraumatic.  Eyes:     Pupils: Pupils are equal, round, and reactive to light.  Cardiovascular:     Rate and Rhythm: Normal rate and regular rhythm.     Heart sounds: Normal heart sounds.  Pulmonary:     Effort: Pulmonary effort is normal.     Breath sounds: Normal breath sounds.  Abdominal:     General: Bowel sounds are normal.     Palpations: Abdomen is soft.  Musculoskeletal:        General: No tenderness or deformity. Normal range of motion.     Cervical back: Normal range of motion.     Comments: Extremities shows the healing surgical scar in the right thigh.  Lymphadenopathy:     Cervical: No cervical adenopathy.  Skin:    General: Skin is warm and dry.      Findings: No erythema or rash.  Neurological:     Mental Status: She is alert and oriented to person, place, and time.  Psychiatric:        Behavior: Behavior normal.        Thought Content: Thought content normal.        Judgment: Judgment normal.      Lab Results  Component Value Date   WBC 3.6 (L) 05/13/2022   HGB  12.6 05/13/2022   HCT 36.2 05/13/2022   MCV 94.0 05/13/2022   PLT 147 (L) 05/13/2022     Chemistry      Component Value Date/Time   NA 136 05/13/2022 0804   NA 142 05/29/2017 1002   K 4.7 05/13/2022 0804   K 3.9 05/29/2017 1002   CL 102 05/13/2022 0804   CO2 28 05/13/2022 0804   CO2 28 05/29/2017 1002   BUN 21 05/13/2022 0804   BUN 13.5 05/29/2017 1002   CREATININE 1.00 05/13/2022 0804   CREATININE 0.81 04/06/2020 1059   CREATININE 0.8 05/29/2017 1002      Component Value Date/Time   CALCIUM 9.0 05/13/2022 0804   CALCIUM 9.4 05/29/2017 1002   ALKPHOS 139 (H) 05/13/2022 0804   ALKPHOS 39 (L) 05/29/2017 1002   AST 31 05/13/2022 0804   AST 18 05/29/2017 1002   ALT 24 05/13/2022 0804   ALT 19 05/29/2017 1002   BILITOT 1.0 05/13/2022 0804   BILITOT 0.48 05/29/2017 1002       Impression and Plan: [name] is a very charming 71 year old white female.  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  This was actually a small lesion.  She is on the [place].  I think she is doing well with this.  Hopefully, we will see that she responding.  She has been on the Alecensa now for about 2 months.  We will set her up with a PET scan in November.  Hopefully, we will find that everything is looking good without any active disease.  I would like to hope that the radiation that she had the surgery that she had has taken care of issues with respect to her metastatic disease.  We will plan to get her back after the PET scan is done.     Volanda Napoleon, MD 10/11/202310:38 AM

## 2022-05-18 NOTE — Progress Notes (Signed)
Patient is doing well on Alecensa. She will need a PET to assess for response prior to her next MD appointment. Scheduled for 06/27/2022.  Patient is aware of PET appointment including date, time, and location. The following prep is reviewed with patient and confirmed with teachback: - arrive 30 minutes before appointment time - NPO except water for 6h before scan. No candy, no gum - hold any diabetic medication the morning of the scan - have a low carb dinner the night prior Radiology Information sheet also mailed to patient's home for reinforcement of education.   Oncology Nurse Navigator Documentation     05/18/2022   11:15 AM  Oncology Nurse Navigator Flowsheets  Navigator Follow Up Date: 06/27/2022  Navigator Follow Up Reason: Scan Review  Navigator Location [facility]  Navigator Encounter Type Treatment;Appt/Treatment Plan Review;Telephone  Telephone Appt Confirmation/Clarification;Education;Outgoing Call  Patient Visit Type MedOnc  Treatment Phase Active Tx  Barriers/Navigation Needs Coordination of Care;Education  Education Other  Interventions Coordination of Care;Education  Acuity Level 2-Minimal Needs (1-2 Barriers Identified)  Coordination of Care Radiology  Education Method Verbal;Written  Support Groups/Services Friends and Family  Time Spent with Patient 30

## 2022-05-19 ENCOUNTER — Encounter: Payer: Self-pay | Admitting: *Deleted

## 2022-05-20 ENCOUNTER — Other Ambulatory Visit: Payer: Self-pay | Admitting: Family Medicine

## 2022-05-20 DIAGNOSIS — I10 Essential (primary) hypertension: Secondary | ICD-10-CM

## 2022-06-06 ENCOUNTER — Other Ambulatory Visit (HOSPITAL_BASED_OUTPATIENT_CLINIC_OR_DEPARTMENT_OTHER): Payer: Self-pay

## 2022-06-06 MED ORDER — AREXVY 120 MCG/0.5ML IM SUSR
INTRAMUSCULAR | 0 refills | Status: DC
  Filled 2022-06-06: qty 1, 1d supply, fill #0

## 2022-06-07 DIAGNOSIS — M84453A Pathological fracture, unspecified femur, initial encounter for fracture: Secondary | ICD-10-CM | POA: Diagnosis not present

## 2022-06-07 DIAGNOSIS — I700 Atherosclerosis of aorta: Secondary | ICD-10-CM | POA: Diagnosis not present

## 2022-06-07 DIAGNOSIS — G4733 Obstructive sleep apnea (adult) (pediatric): Secondary | ICD-10-CM | POA: Diagnosis not present

## 2022-06-07 DIAGNOSIS — R918 Other nonspecific abnormal finding of lung field: Secondary | ICD-10-CM | POA: Diagnosis not present

## 2022-06-15 ENCOUNTER — Other Ambulatory Visit: Payer: Medicare HMO

## 2022-06-15 ENCOUNTER — Encounter (INDEPENDENT_AMBULATORY_CARE_PROVIDER_SITE_OTHER): Payer: Medicare HMO | Admitting: Family Medicine

## 2022-06-15 ENCOUNTER — Ambulatory Visit: Payer: Medicare HMO | Admitting: Family

## 2022-06-15 DIAGNOSIS — H00012 Hordeolum externum right lower eyelid: Secondary | ICD-10-CM

## 2022-06-15 MED ORDER — ERYTHROMYCIN 5 MG/GM OP OINT: 1.0000 | TOPICAL_OINTMENT | Freq: Three times a day (TID) | OPHTHALMIC | 0 refills | Status: DC

## 2022-06-15 NOTE — Telephone Encounter (Signed)
Please see the MyChart message reply(ies) for my assessment and plan.  The patient gave consent for this Medical Advice Message and is aware that it may result in a bill to their insurance company as well as the possibility that this may result in a co-payment or deductible. They are an established patient, but are not seeking medical advice exclusively about a problem treated during an in person or video visit in the last 7 days. I did not recommend an in person or video visit within 7 days of my reply.  I spent a total of 10 minutes cumulative time within 7 days through [place] Lamar Blinks, MD

## 2022-06-15 NOTE — Telephone Encounter (Signed)
Do you recommend UC? We have no openings today.

## 2022-06-17 DIAGNOSIS — D225 Melanocytic nevi of trunk: Secondary | ICD-10-CM | POA: Diagnosis not present

## 2022-06-17 DIAGNOSIS — L821 Other seborrheic keratosis: Secondary | ICD-10-CM | POA: Diagnosis not present

## 2022-06-17 DIAGNOSIS — L814 Other melanin hyperpigmentation: Secondary | ICD-10-CM | POA: Diagnosis not present

## 2022-06-17 DIAGNOSIS — L570 Actinic keratosis: Secondary | ICD-10-CM | POA: Diagnosis not present

## 2022-06-17 DIAGNOSIS — L738 Other specified follicular disorders: Secondary | ICD-10-CM | POA: Diagnosis not present

## 2022-06-17 DIAGNOSIS — Z85828 Personal history of other malignant neoplasm of skin: Secondary | ICD-10-CM | POA: Diagnosis not present

## 2022-06-17 DIAGNOSIS — L300 Nummular dermatitis: Secondary | ICD-10-CM | POA: Diagnosis not present

## 2022-06-17 DIAGNOSIS — Z08 Encounter for follow-up examination after completed treatment for malignant neoplasm: Secondary | ICD-10-CM | POA: Diagnosis not present

## 2022-06-27 ENCOUNTER — Encounter (HOSPITAL_COMMUNITY)
Admission: RE | Admit: 2022-06-27 | Discharge: 2022-06-27 | Disposition: A | Discharge status: Home / Self Care | Payer: Medicare HMO | Source: Ambulatory Visit | Attending: Hematology & Oncology | Admitting: Hematology & Oncology

## 2022-06-27 DIAGNOSIS — R911 Solitary pulmonary nodule: Secondary | ICD-10-CM | POA: Diagnosis not present

## 2022-06-27 DIAGNOSIS — C7951 Secondary malignant neoplasm of bone: Secondary | ICD-10-CM | POA: Diagnosis not present

## 2022-06-27 DIAGNOSIS — C3490 Malignant neoplasm of unspecified part of unspecified bronchus or lung: Secondary | ICD-10-CM | POA: Diagnosis not present

## 2022-06-27 LAB — GLUCOSE, CAPILLARY: Glucose-Capillary: 90 mg/dL (ref 70–99)

## 2022-06-27 MED ORDER — FLUDEOXYGLUCOSE F - 18 (FDG) INJECTION
8.1000 | Freq: Once | INTRAVENOUS | Status: AC
  Administered 2022-06-27: 8.1 via INTRAVENOUS

## 2022-06-28 ENCOUNTER — Encounter: Payer: Self-pay | Admitting: *Deleted

## 2022-06-28 NOTE — Progress Notes (Signed)
Reviewed PET and relayed results to patient via [place].  Oncology Nurse Navigator Documentation     06/28/2022    7:45 AM  Oncology Nurse Navigator Flowsheets  Navigator Follow Up Date: 07/07/2022  Navigator Follow Up Reason: Follow-up Appointment  Navigator Location [facility]  Navigator Encounter Type MyChart;Diagnostic Results;Scan Review  Patient Visit Type MedOnc  Treatment Phase Active Tx  Barriers/Navigation Needs Coordination of Care;Education  Education Other  Interventions Education  Acuity Level 2-Minimal Needs (1-2 Barriers Identified)  Education Method Written  Support Groups/Services Friends and Family  Time Spent with Patient 15

## 2022-07-06 ENCOUNTER — Other Ambulatory Visit (HOSPITAL_BASED_OUTPATIENT_CLINIC_OR_DEPARTMENT_OTHER): Payer: Self-pay | Admitting: Orthopedic Surgery

## 2022-07-06 ENCOUNTER — Ambulatory Visit (HOSPITAL_BASED_OUTPATIENT_CLINIC_OR_DEPARTMENT_OTHER)
Admission: RE | Admit: 2022-07-06 | Discharge: 2022-07-06 | Disposition: A | Discharge status: Home / Self Care | Payer: Medicare HMO | Source: Ambulatory Visit | Attending: Orthopedic Surgery | Admitting: Orthopedic Surgery

## 2022-07-06 DIAGNOSIS — R2241 Localized swelling, mass and lump, right lower limb: Secondary | ICD-10-CM | POA: Diagnosis not present

## 2022-07-06 DIAGNOSIS — S72301D Unspecified fracture of shaft of right femur, subsequent encounter for closed fracture with routine healing: Secondary | ICD-10-CM | POA: Diagnosis not present

## 2022-07-06 DIAGNOSIS — M7989 Other specified soft tissue disorders: Secondary | ICD-10-CM

## 2022-07-06 DIAGNOSIS — R600 Localized edema: Secondary | ICD-10-CM | POA: Diagnosis not present

## 2022-07-07 ENCOUNTER — Encounter: Payer: Self-pay | Admitting: *Deleted

## 2022-07-07 ENCOUNTER — Inpatient Hospital Stay (HOSPITAL_BASED_OUTPATIENT_CLINIC_OR_DEPARTMENT_OTHER): Payer: Medicare HMO | Admitting: Hematology & Oncology

## 2022-07-07 ENCOUNTER — Other Ambulatory Visit (HOSPITAL_COMMUNITY): Payer: Self-pay

## 2022-07-07 ENCOUNTER — Other Ambulatory Visit: Payer: Self-pay

## 2022-07-07 ENCOUNTER — Inpatient Hospital Stay: Payer: Medicare HMO

## 2022-07-07 ENCOUNTER — Telehealth: Payer: Self-pay

## 2022-07-07 ENCOUNTER — Inpatient Hospital Stay: Payer: Medicare HMO | Attending: Hematology & Oncology

## 2022-07-07 ENCOUNTER — Encounter: Payer: Self-pay | Admitting: Hematology & Oncology

## 2022-07-07 VITALS — BP 115/45 | HR 70 | Temp 98.1°F | Resp 17 | Ht <= 58 in | Wt 173.0 lb

## 2022-07-07 DIAGNOSIS — I700 Atherosclerosis of aorta: Secondary | ICD-10-CM | POA: Diagnosis not present

## 2022-07-07 DIAGNOSIS — C3490 Malignant neoplasm of unspecified part of unspecified bronchus or lung: Secondary | ICD-10-CM

## 2022-07-07 DIAGNOSIS — C7951 Secondary malignant neoplasm of bone: Secondary | ICD-10-CM | POA: Insufficient documentation

## 2022-07-07 DIAGNOSIS — M84453A Pathological fracture, unspecified femur, initial encounter for fracture: Secondary | ICD-10-CM | POA: Diagnosis not present

## 2022-07-07 DIAGNOSIS — C3432 Malignant neoplasm of lower lobe, left bronchus or lung: Secondary | ICD-10-CM | POA: Diagnosis not present

## 2022-07-07 DIAGNOSIS — R918 Other nonspecific abnormal finding of lung field: Secondary | ICD-10-CM | POA: Diagnosis not present

## 2022-07-07 DIAGNOSIS — G4733 Obstructive sleep apnea (adult) (pediatric): Secondary | ICD-10-CM | POA: Diagnosis not present

## 2022-07-07 LAB — CBC WITH DIFFERENTIAL (CANCER CENTER ONLY)
Abs Immature Granulocytes: 0.04 10*3/uL (ref 0.00–0.07)
Basophils Absolute: 0 10*3/uL (ref 0.0–0.1)
Basophils Relative: 1 %
Eosinophils Absolute: 0.1 10*3/uL (ref 0.0–0.5)
Eosinophils Relative: 3 %
HCT: 32.4 % — ABNORMAL LOW (ref 36.0–46.0)
Hemoglobin: 11.1 g/dL — ABNORMAL LOW (ref 12.0–15.0)
Immature Granulocytes: 1 %
Lymphocytes Relative: 23 %
Lymphs Abs: 0.9 10*3/uL (ref 0.7–4.0)
MCH: 34.9 pg — ABNORMAL HIGH (ref 26.0–34.0)
MCHC: 34.3 g/dL (ref 30.0–36.0)
MCV: 101.9 fL — ABNORMAL HIGH (ref 80.0–100.0)
Monocytes Absolute: 0.5 10*3/uL (ref 0.1–1.0)
Monocytes Relative: 13 %
Neutro Abs: 2.1 10*3/uL (ref 1.7–7.7)
Neutrophils Relative %: 59 %
Platelet Count: 207 10*3/uL (ref 150–400)
RBC: 3.18 MIL/uL — ABNORMAL LOW (ref 3.87–5.11)
RDW: 14.6 % (ref 11.5–15.5)
WBC Count: 3.7 10*3/uL — ABNORMAL LOW (ref 4.0–10.5)
nRBC: 0 % (ref 0.0–0.2)

## 2022-07-07 LAB — CMP (CANCER CENTER ONLY)
ALT: 42 U/L (ref 0–44)
AST: 39 U/L (ref 15–41)
Albumin: 4.2 g/dL (ref 3.5–5.0)
Alkaline Phosphatase: 91 U/L (ref 38–126)
Anion gap: 8 (ref 5–15)
BUN: 27 mg/dL — ABNORMAL HIGH (ref 8–23)
CO2: 32 mmol/L (ref 22–32)
Calcium: 9.2 mg/dL (ref 8.9–10.3)
Chloride: 103 mmol/L (ref 98–111)
Creatinine: 1.1 mg/dL — ABNORMAL HIGH (ref 0.44–1.00)
GFR, Estimated: 54 mL/min — ABNORMAL LOW (ref >60)
Glucose, Bld: 95 mg/dL (ref 70–99)
Potassium: 3.8 mmol/L (ref 3.5–5.1)
Sodium: 143 mmol/L (ref 135–145)
Total Bilirubin: 1 mg/dL (ref 0.3–1.2)
Total Protein: 6.3 g/dL — ABNORMAL LOW (ref 6.5–8.1)

## 2022-07-07 LAB — LACTATE DEHYDROGENASE: LDH: 268 U/L — ABNORMAL HIGH (ref 98–192)

## 2022-07-07 MED ORDER — DENOSUMAB 120 MG/1.7ML ~~LOC~~ SOLN
120.0000 mg | Freq: Once | SUBCUTANEOUS | Status: AC
  Administered 2022-07-07: 120 mg via SUBCUTANEOUS
  Filled 2022-07-07: qty 1.7

## 2022-07-07 NOTE — Telephone Encounter (Signed)
Oral Oncology Patient Advocate Encounter   Received notification that patient is due for re-enrollment for assistance for Alecensa through [place].   Re-enrollment process has been initiated and will be submitted upon completion of necessary documents.  [employer]'s phone number [phone].   I will continue to follow until final determination.  Berdine Addison, [facility] Patient [place]  [phone] (phone) [phone] (fax) 07/07/2022 4:03 PM

## 2022-07-07 NOTE — Progress Notes (Signed)
Hematology and Oncology Follow Up Visit  Laura Bush [id number] [birth year] 71 y.o. 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 3 months  -next dose in 06/27/2022  S/P SBRT to LUL -- completed on 03/04/2022 S/P XRT to RIGHT femur -- completed on 03/04/2022 Alecensa 600 mg po BID -- start on 03/24/2022     Interim History:  [name] is back for follow-up.  Unfortunately, she is going to be busy.  She has gone to [place].  Her mother-in-law passed away.  In addition, her mom is not doing well.  Her mother has dementia.  She is leaving for [place] today.  She had a PET scan that was done.  This was done on the 20th.  The PET scan did not show any evidence of cancer activity on the PET scan.  Where she had the radiosurgery, that area looked fine.  Patient has little swelling in the right leg.  She had a Doppler done yesterday which did not show any thromboembolic disease.  I told her to make sure she takes her baby aspirin although trip to [place] and to make sure that she drinks a lot of water and when she drives, to get out every hour or so to walk around.  She has had no problems with nausea or vomiting.  She has had no diarrhea.  In fact, she had a little constipation..  She has had no problems with pain.  She has had no cough or shortness of breath.  There has been no chest wall pain.  Overall, her performance status is ECOG 1.    Medications:  Current Outpatient Medications:    acetaminophen (TYLENOL) 500 MG tablet, Take 1 tablet (500 mg total) by mouth every 8 (eight) hours as needed for mild pain or moderate pain., Disp: 30 tablet, Rfl: 0   alectinib (ALECENSA) 150 MG capsule, Take 4 capsules (600 mg total) by mouth 2 (two) times daily with a meal., Disp: 240 capsule, Rfl: 6   aMILoride (MIDAMOR)  5 MG tablet, TAKE ONE TABLET BY MOUTH DAILY, Disp: 90 tablet, Rfl: 3   aspirin EC 81 MG tablet, Take 81 mg by mouth daily. Swallow whole., Disp: , Rfl:    B Complex-C (SUPER B COMPLEX PO), Take 1 tablet by mouth daily with breakfast., Disp: , Rfl:    Calcium Carb-Cholecalciferol (CALCIUM + D3 PO), Take 1 tablet by mouth daily., Disp: , Rfl:    cholecalciferol (VITAMIN D3) 25 MCG (1000 UNIT) tablet, Take 1,000 Units by mouth daily., Disp: , Rfl:    diphenhydrAMINE (BENADRYL) 25 mg capsule, Take 25 mg by mouth at bedtime as needed., Disp: , Rfl:    erythromycin ophthalmic ointment, Place 1 Application into the right eye 3 (three) times daily. Use as needed for stye, Disp: 3.5 g, Rfl: 0   fexofenadine (ALLEGRA) 180 MG tablet, Take 180 mg by mouth in the morning., Disp: , Rfl:    hydrochlorothiazide (HYDRODIURIL) 25 MG tablet, Take 25 mg by mouth daily., Disp: , Rfl:    influenza vaccine adjuvanted (FLUAD QUADRIVALENT) 0.5 ML injection, Inject into the muscle., Disp: 0.5 mL, Rfl: 0   ipratropium (ATROVENT) 0.06 % nasal spray, Place 2 sprays into both nostrils 4 (four) times daily. Use as needed (Patient taking differently: Place 2 sprays into both nostrils 4 (  four) times daily as needed for rhinitis.), Disp: 15 mL, Rfl: 4   Lactulose 20 GM/30ML SOLN, Take 30 mLs (20 g total) by mouth every 2 (two) hours as needed. Take q2h until BM as needed for severe constipation., Disp: 450 mL, Rfl: 1   NIFEdipine (PROCARDIA-XL/NIFEDICAL-XL) 30 MG 24 hr tablet, TAKE ONE TABLET BY MOUTH DAILY, Disp: 90 tablet, Rfl: 1   pantoprazole (PROTONIX) 40 MG tablet, Take 1 tablet (40 mg total) by mouth daily. (Patient taking differently: Take 40 mg by mouth daily before breakfast.), Disp: 90 tablet, Rfl: 3   polyethylene glycol (MIRALAX / GLYCOLAX) 17 g packet, Take 17 g by mouth daily., Disp: , Rfl:    RSV vaccine recomb adjuvanted (AREXVY) 120 MCG/0.5ML injection, Inject into the muscle., Disp: 1 each, Rfl: 0   simvastatin  (ZOCOR) 20 MG tablet, TAKE 1 TABLET BY MOUTH EVERY DAY, Disp: 90 tablet, Rfl: 3   Tdap (BOOSTRIX) 5-2.5-18.5 LF-MCG/0.5 injection, Inject into the muscle., Disp: 0.5 mL, Rfl: 0   traZODone (DESYREL) 50 MG tablet, Take 1 tablet (50 mg total) by mouth at bedtime as needed for sleep., Disp: 30 tablet, Rfl: 3  Allergies:  Allergies  Allergen Reactions   Diclofenac Hypertension   Amlodipine Swelling and Other (See Comments)    Limbs swell, not the throat   Lanolin Other (See Comments)    Sneezing and watery eyes. Allergic to [facility].   Tape Other (See Comments)    Redness (Bandaids also)    Past Medical History, Surgical history, Social history, and Family History were reviewed and updated.  Review of Systems: Review of Systems  Constitutional: Negative.   HENT:  Negative.    Eyes: Negative.   Respiratory: Negative.    Cardiovascular: Negative.   Gastrointestinal: Negative.   Endocrine: Negative.   Genitourinary: Negative.    Musculoskeletal:  Positive for arthralgias.  Skin: Negative.   Neurological: Negative.   Hematological: Negative.   Psychiatric/Behavioral: Negative.      Physical Exam:  height is _0  (1.473 m) and weight is 173 lb (78.5 kg). Her oral temperature is 98.1 F (36.7 C). Her blood pressure is 115/45 (abnormal) and her pulse is 70. Her respiration is 17 and oxygen saturation is 100%.   Wt Readings from Last 3 Encounters:  07/07/22 173 lb (78.5 kg)  05/18/22 163 lb (73.9 kg)  04/27/22 162 lb 9.6 oz (73.8 kg)    Physical Exam Vitals reviewed.  HENT:     Head: Normocephalic and atraumatic.  Eyes:     Pupils: Pupils are equal, round, and reactive to light.  Cardiovascular:     Rate and Rhythm: Normal rate and regular rhythm.     Heart sounds: Normal heart sounds.  Pulmonary:     Effort: Pulmonary effort is normal.     Breath sounds: Normal breath sounds.  Abdominal:     General: Bowel sounds are normal.     Palpations: Abdomen is soft.   Musculoskeletal:        General: No tenderness or deformity. Normal range of motion.     Cervical back: Normal range of motion.     Comments: Extremities shows the healing surgical scar in the right thigh.  Lymphadenopathy:     Cervical: No cervical adenopathy.  Skin:    General: Skin is warm and dry.     Findings: No erythema or rash.  Neurological:     Mental Status: She is alert and oriented to person, place, and time.  Psychiatric:  Behavior: Behavior normal.        Thought Content: Thought content normal.        Judgment: Judgment normal.     Lab Results  Component Value Date   WBC 3.7 (L) 07/07/2022   HGB 11.1 (L) 07/07/2022   HCT 32.4 (L) 07/07/2022   MCV 101.9 (H) 07/07/2022   PLT 207 07/07/2022     Chemistry      Component Value Date/Time   NA 143 07/07/2022 0949   NA 142 05/29/2017 1002   K 3.8 07/07/2022 0949   K 3.9 05/29/2017 1002   CL 103 07/07/2022 0949   CO2 32 07/07/2022 0949   CO2 28 05/29/2017 1002   BUN 27 (H) 07/07/2022 0949   BUN 13.5 05/29/2017 1002   CREATININE 1.10 (H) 07/07/2022 0949   CREATININE 0.81 04/06/2020 1059   CREATININE 0.8 05/29/2017 1002      Component Value Date/Time   CALCIUM 9.2 07/07/2022 0949   CALCIUM 9.4 05/29/2017 1002   ALKPHOS 91 07/07/2022 0949   ALKPHOS 39 (L) 05/29/2017 1002   AST 39 07/07/2022 0949   AST 18 05/29/2017 1002   ALT 42 07/07/2022 0949   ALT 19 05/29/2017 1002   BILITOT 1.0 07/07/2022 0949   BILITOT 0.48 05/29/2017 1002       Impression and Plan: [name] is a very charming 71 year old white female.  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  This was actually a small lesion.  She is on the [place].  I think she is doing well with this.  The PET scan shows that she has responded.  I do not think we have to do another PET scan probably for about 3 months or so.  I know that she is going to be busy for the next couple weeks.  I know that she will have a  quiet Christmas.  I would like to see her back in January.    Volanda Napoleon, MD 11/30/202311:12 AM

## 2022-07-07 NOTE — Patient Instructions (Signed)
Denosumab Injection (Oncology) What is this medication? DENOSUMAB (den oh SUE mab) prevents weakened bones caused by cancer. It may also be used to treat noncancerous bone tumors that cannot be removed by surgery. It can also be used to treat high calcium levels in the blood caused by cancer. It works by blocking a protein that causes bones to break down quickly. This slows down the release of calcium from bones, which lowers calcium levels in your blood. It also makes your bones stronger and less likely to break (fracture). This medicine may be used for other purposes; ask your health care provider or pharmacist if you have questions. COMMON BRAND NAME(S): XGEVA What should I tell my care team before I take this medication? They need to know if you have any of these conditions: Dental disease Having surgery or tooth extraction Infection Kidney disease Low levels of calcium or vitamin D in the blood Malnutrition On hemodialysis Skin conditions or sensitivity Thyroid or parathyroid disease An unusual reaction to denosumab, other medications, foods, dyes, or preservatives Pregnant or trying to get pregnant Breast-feeding How should I use this medication? This medication is for injection under the skin. It is given by your care team in a hospital or clinic setting. A special MedGuide will be given to you before each treatment. Be sure to read this information carefully each time. Talk to your care team about the use of this medication in children. While it may be prescribed for children as young as 13 years for selected conditions, precautions do apply. Overdosage: If you think you have taken too much of this medicine contact a poison control center or emergency room at once. NOTE: This medicine is only for you. Do not share this medicine with others. What if I miss a dose? Keep appointments for follow-up doses. It is important not to miss your dose. Call your care team if you are unable to  keep an appointment. What may interact with this medication? Do not take this medication with any of the following: Other medications containing denosumab This medication may also interact with the following: Medications that lower your chance of fighting infection Steroid medications, such as prednisone or cortisone This list may not describe all possible interactions. Give your health care provider a list of all the medicines, herbs, non-prescription drugs, or dietary supplements you use. Also tell them if you smoke, drink alcohol, or use illegal drugs. Some items may interact with your medicine. What should I watch for while using this medication? Your condition will be monitored carefully while you are receiving this medication. You may need blood work while taking this medication. This medication may increase your risk of getting an infection. Call your care team for advice if you get a fever, chills, sore throat, or other symptoms of a cold or flu. Do not treat yourself. Try to avoid being around people who are sick. You should make sure you get enough calcium and vitamin D while you are taking this medication, unless your care team tells you not to. Discuss the foods you eat and the vitamins you take with your care team. Some people who take this medication have severe bone, joint, or muscle pain. This medication may also increase your risk for jaw problems or a broken thigh bone. Tell your care team right away if you have severe pain in your jaw, bones, joints, or muscles. Tell your care team if you have any pain that does not go away or that gets worse. Talk   to your care team if you may be pregnant. Serious birth defects can occur if you take this medication during pregnancy and for 5 months after the last dose. You will need a negative pregnancy test before starting this medication. Contraception is recommended while taking this medication and for 5 months after the last dose. Your care team  can help you find the option that works for you. What side effects may I notice from receiving this medication? Side effects that you should report to your care team as soon as possible: Allergic reactions--skin rash, itching, hives, swelling of the face, lips, tongue, or throat Bone, joint, or muscle pain Low calcium level--muscle pain or cramps, confusion, tingling, or numbness in the hands or feet Osteonecrosis of the jaw--pain, swelling, or redness in the mouth, numbness of the jaw, poor healing after dental work, unusual discharge from the mouth, visible bones in the mouth Side effects that usually do not require medical attention (report to your care team if they continue or are bothersome): Cough Diarrhea Fatigue Headache Nausea This list may not describe all possible side effects. Call your doctor for medical advice about side effects. You may report side effects to FDA at 1-800-FDA-1088. Where should I keep my medication? This medication is given in a hospital or clinic. It will not be stored at home. NOTE: This sheet is a summary. It may not cover all possible information. If you have questions about this medicine, talk to your doctor, pharmacist, or health care provider.  2023 Elsevier/Gold Standard (2021-12-13 00:00:00)  

## 2022-07-07 NOTE — Progress Notes (Signed)
Patient continues to do very well on treatment. No navigational needs at this time.   Oncology Nurse Navigator Documentation     07/07/2022   10:15 AM  Oncology Nurse Navigator Flowsheets  Navigator Follow Up Date: 08/22/2022  Navigator Follow Up Reason: Follow-up Appointment  Navigator Location [facility]  Navigator Encounter Type Follow-up Appt;Appt/Treatment Plan Review  Patient Visit Type MedOnc  Treatment Phase Active Tx  Barriers/Navigation Needs No Barriers At This Time  Interventions Psycho-Social Support  Acuity Level 1-No Barriers  Support Groups/Services Friends and Family  Time Spent with Patient 15

## 2022-07-12 NOTE — Telephone Encounter (Signed)
Oral Oncology Patient Advocate Encounter  Reached out and spoke with patient regarding PAP paperwork, explained that I would send it to their preferred email via [employer].   Confirmed email address: [email or web address].    Patient expressed understanding and consent.  Will follow up once paperwork has been signed and returned.   Berdine Addison, [facility] Patient [place]  [phone] (phone) [phone] (fax) 07/12/2022 8:46 AM

## 2022-07-13 ENCOUNTER — Other Ambulatory Visit: Payer: Self-pay

## 2022-07-13 ENCOUNTER — Encounter: Payer: Self-pay | Admitting: Family Medicine

## 2022-07-13 ENCOUNTER — Telehealth: Payer: Self-pay | Admitting: Family Medicine

## 2022-07-13 ENCOUNTER — Encounter: Payer: Self-pay | Admitting: Hematology & Oncology

## 2022-07-13 DIAGNOSIS — E782 Mixed hyperlipidemia: Secondary | ICD-10-CM

## 2022-07-13 MED ORDER — SIMVASTATIN 20 MG PO TABS: 20.0000 mg | ORAL_TABLET | Freq: Every day | ORAL | 3 refills | Status: DC

## 2022-07-13 NOTE — Telephone Encounter (Signed)
Filled

## 2022-07-13 NOTE — Telephone Encounter (Signed)
[facility] called stating that pt needed a refill of this medication and needed to change it to a 100 day supply:  Prescription Request  07/13/2022  Is this a "Controlled Substance" medicine? No  LOV: 04/27/2022  What is the name of the medication or equipment?   simvastatin (ZOCOR) 20 MG tablet [[id number]]   Have you contacted your pharmacy to request a refill? No   Which pharmacy would you like this sent to?   [facility] [id number] - [address] [place] [address] Phone: [phone] Fax: [phone]  Patient notified that their request is being sent to the clinical staff for review and that they should receive a response within 2 business days.   Please advise at Mobile [phone] (mobile)

## 2022-07-14 NOTE — Telephone Encounter (Signed)
Received patient signatures. Will submit once I receive MD signatures.  Laura Bush, [name] Patient [place]  [phone] (phone) [phone] (fax) 07/14/2022 9:22 AM

## 2022-07-26 NOTE — Telephone Encounter (Signed)
Oral Oncology Patient Advocate Encounter   Submitted application for assistance for Alecensa to [place].   Application submitted via e-fax to [phone]   [employer]'s phone number [phone].   I will continue to check the status until final determination.   Berdine Addison, [facility] Patient [place]  [phone] (phone) [phone] (fax) 07/26/2022 2:58 PM

## 2022-07-31 ENCOUNTER — Other Ambulatory Visit: Payer: Self-pay | Admitting: Hematology & Oncology

## 2022-08-04 ENCOUNTER — Other Ambulatory Visit: Payer: Self-pay | Admitting: Family Medicine

## 2022-08-04 DIAGNOSIS — Z1231 Encounter for screening mammogram for malignant neoplasm of breast: Secondary | ICD-10-CM

## 2022-08-07 DIAGNOSIS — R918 Other nonspecific abnormal finding of lung field: Secondary | ICD-10-CM | POA: Diagnosis not present

## 2022-08-07 DIAGNOSIS — G4733 Obstructive sleep apnea (adult) (pediatric): Secondary | ICD-10-CM | POA: Diagnosis not present

## 2022-08-07 DIAGNOSIS — I700 Atherosclerosis of aorta: Secondary | ICD-10-CM | POA: Diagnosis not present

## 2022-08-07 DIAGNOSIS — M84453A Pathological fracture, unspecified femur, initial encounter for fracture: Secondary | ICD-10-CM | POA: Diagnosis not present

## 2022-08-16 ENCOUNTER — Telehealth: Payer: Medicare HMO

## 2022-08-18 ENCOUNTER — Telehealth: Payer: Medicare HMO

## 2022-08-18 ENCOUNTER — Ambulatory Visit: Payer: Medicare HMO | Admitting: Hematology & Oncology

## 2022-08-18 ENCOUNTER — Other Ambulatory Visit: Payer: Medicare HMO

## 2022-08-22 ENCOUNTER — Ambulatory Visit (HOSPITAL_BASED_OUTPATIENT_CLINIC_OR_DEPARTMENT_OTHER)
Admission: RE | Admit: 2022-08-22 | Discharge: 2022-08-22 | Disposition: A | Discharge status: Home / Self Care | Payer: Medicare HMO | Source: Ambulatory Visit | Attending: Hematology & Oncology | Admitting: Hematology & Oncology

## 2022-08-22 ENCOUNTER — Other Ambulatory Visit: Payer: Self-pay | Admitting: Family Medicine

## 2022-08-22 ENCOUNTER — Encounter: Payer: Self-pay | Admitting: Hematology & Oncology

## 2022-08-22 ENCOUNTER — Other Ambulatory Visit: Payer: Self-pay

## 2022-08-22 ENCOUNTER — Inpatient Hospital Stay: Payer: Medicare HMO | Attending: Hematology & Oncology

## 2022-08-22 ENCOUNTER — Inpatient Hospital Stay (HOSPITAL_BASED_OUTPATIENT_CLINIC_OR_DEPARTMENT_OTHER): Payer: Medicare HMO | Admitting: Hematology & Oncology

## 2022-08-22 VITALS — BP 94/38 | HR 53 | Temp 97.8°F | Resp 18 | Ht <= 58 in | Wt 174.0 lb

## 2022-08-22 DIAGNOSIS — C3490 Malignant neoplasm of unspecified part of unspecified bronchus or lung: Secondary | ICD-10-CM

## 2022-08-22 DIAGNOSIS — R062 Wheezing: Secondary | ICD-10-CM | POA: Diagnosis not present

## 2022-08-22 DIAGNOSIS — C3432 Malignant neoplasm of lower lobe, left bronchus or lung: Secondary | ICD-10-CM | POA: Insufficient documentation

## 2022-08-22 DIAGNOSIS — C7951 Secondary malignant neoplasm of bone: Secondary | ICD-10-CM

## 2022-08-22 DIAGNOSIS — Z79899 Other long term (current) drug therapy: Secondary | ICD-10-CM | POA: Insufficient documentation

## 2022-08-22 DIAGNOSIS — K219 Gastro-esophageal reflux disease without esophagitis: Secondary | ICD-10-CM

## 2022-08-22 LAB — CMP (CANCER CENTER ONLY)
ALT: 25 U/L (ref 0–44)
AST: 30 U/L (ref 15–41)
Albumin: 4.3 g/dL (ref 3.5–5.0)
Alkaline Phosphatase: 77 U/L (ref 38–126)
Anion gap: 8 (ref 5–15)
BUN: 25 mg/dL — ABNORMAL HIGH (ref 8–23)
CO2: 29 mmol/L (ref 22–32)
Calcium: 9.7 mg/dL (ref 8.9–10.3)
Chloride: 104 mmol/L (ref 98–111)
Creatinine: 1.08 mg/dL — ABNORMAL HIGH (ref 0.44–1.00)
GFR, Estimated: 55 mL/min — ABNORMAL LOW (ref >60)
Glucose, Bld: 104 mg/dL — ABNORMAL HIGH (ref 70–99)
Potassium: 4.3 mmol/L (ref 3.5–5.1)
Sodium: 141 mmol/L (ref 135–145)
Total Bilirubin: 0.9 mg/dL (ref 0.3–1.2)
Total Protein: 6.9 g/dL (ref 6.5–8.1)

## 2022-08-22 LAB — CBC WITH DIFFERENTIAL (CANCER CENTER ONLY)
Abs Immature Granulocytes: 0.03 10*3/uL (ref 0.00–0.07)
Basophils Absolute: 0.1 10*3/uL (ref 0.0–0.1)
Basophils Relative: 1 %
Eosinophils Absolute: 0.1 10*3/uL (ref 0.0–0.5)
Eosinophils Relative: 3 %
HCT: 33.7 % — ABNORMAL LOW (ref 36.0–46.0)
Hemoglobin: 11.5 g/dL — ABNORMAL LOW (ref 12.0–15.0)
Immature Granulocytes: 1 %
Lymphocytes Relative: 23 %
Lymphs Abs: 1.1 10*3/uL (ref 0.7–4.0)
MCH: 35.3 pg — ABNORMAL HIGH (ref 26.0–34.0)
MCHC: 34.1 g/dL (ref 30.0–36.0)
MCV: 103.4 fL — ABNORMAL HIGH (ref 80.0–100.0)
Monocytes Absolute: 0.6 10*3/uL (ref 0.1–1.0)
Monocytes Relative: 12 %
Neutro Abs: 3 10*3/uL (ref 1.7–7.7)
Neutrophils Relative %: 60 %
Platelet Count: 223 10*3/uL (ref 150–400)
RBC: 3.26 MIL/uL — ABNORMAL LOW (ref 3.87–5.11)
RDW: 13.7 % (ref 11.5–15.5)
WBC Count: 5 10*3/uL (ref 4.0–10.5)
nRBC: 0 % (ref 0.0–0.2)

## 2022-08-22 LAB — CEA (IN HOUSE-CHCC): CEA (CHCC-In House): 11.32 ng/mL — ABNORMAL HIGH (ref 0.00–5.00)

## 2022-08-22 LAB — LACTATE DEHYDROGENASE: LDH: 266 U/L — ABNORMAL HIGH (ref 98–192)

## 2022-08-22 NOTE — Progress Notes (Signed)
Hematology and Oncology Follow Up Visit  Laura Bush [id number] 12-May-1951 72 y.o. 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 3 months  -next dose in 09/2022  S/P SBRT to LUL -- completed on 03/04/2022 S/P XRT to RIGHT femur -- completed on 03/04/2022 Alecensa 600 mg po BID -- start on 03/24/2022     Interim History:  [name] is back for follow-up.  She is doing pretty well.  She comes in with her husband.  His mom did pass away.  Her mom is not doing all that well.  She has dementia.  Her 89nd birthday is this Friday.  She has some wheezing going on.  This seems to be in the left lung.  This is where her cancer started.  I will get a chest x-ray on her to see how everything looks.  She has had no fever.  She is not coughing up any sputum.  She has had no nausea or vomiting.  She had a little bit of swelling in the legs.  Her blood pressure is on the low side.  She is on 2 diuretics and a antihypertensive.  She will stop the amiloride.  She has had no diarrhea.  There is been no urinary difficulty.  She is off Ozempic now.  She been off it now for several months.  She lost 40 pounds.  She is gained some weight back.  She has had no headache.  Overall, I would say that her performance status is probably ECOG 1.    Medications:  Current Outpatient Medications:    acetaminophen (TYLENOL) 500 MG tablet, Take 1 tablet (500 mg total) by mouth every 8 (eight) hours as needed for mild pain or moderate pain., Disp: 30 tablet, Rfl: 0   alectinib (ALECENSA) 150 MG capsule, Take 4 capsules (600 mg total) by mouth 2 (two) times daily with a meal., Disp: 240 capsule, Rfl: 6   aMILoride (MIDAMOR) 5 MG tablet, TAKE ONE TABLET BY MOUTH DAILY, Disp: 90 tablet, Rfl: 3   aspirin EC 81 MG tablet, Take 81 mg by  mouth daily. Swallow whole., Disp: , Rfl:    B Complex-C (SUPER B COMPLEX PO), Take 1 tablet by mouth daily with breakfast., Disp: , Rfl:    Calcium Carb-Cholecalciferol (CALCIUM + D3 PO), Take 1 tablet by mouth daily., Disp: , Rfl:    cholecalciferol (VITAMIN D3) 25 MCG (1000 UNIT) tablet, Take 1,000 Units by mouth daily., Disp: , Rfl:    diphenhydrAMINE (BENADRYL) 25 mg capsule, Take 25 mg by mouth at bedtime as needed., Disp: , Rfl:    fexofenadine (ALLEGRA) 180 MG tablet, Take 180 mg by mouth in the morning., Disp: , Rfl:    hydrochlorothiazide (HYDRODIURIL) 25 MG tablet, Take 25 mg by mouth daily., Disp: , Rfl:    ipratropium (ATROVENT) 0.06 % nasal spray, Place 2 sprays into both nostrils 4 (four) times daily. Use as needed (Patient taking differently: Place 2 sprays into both nostrils 4 (four) times daily as needed for rhinitis.), Disp: 15 mL, Rfl: 4   Lactulose 20 GM/30ML SOLN, Take 30 mLs (20 g total) by mouth every 2 (two) hours as needed. Take q2h until BM as needed for severe constipation., Disp: 450 mL, Rfl: 1   NIFEdipine (PROCARDIA-XL/NIFEDICAL-XL) 30 MG 24 hr tablet, TAKE ONE  TABLET BY MOUTH DAILY, Disp: 90 tablet, Rfl: 1   pantoprazole (PROTONIX) 40 MG tablet, TAKE ONE TABLET BY MOUTH DAILY, Disp: 90 tablet, Rfl: 3   polyethylene glycol (MIRALAX / GLYCOLAX) 17 g packet, Take 17 g by mouth daily., Disp: , Rfl:    simvastatin (ZOCOR) 20 MG tablet, Take 1 tablet (20 mg total) by mouth daily., Disp: 100 tablet, Rfl: 3   traZODone (DESYREL) 50 MG tablet, TAKE ONE TABLET BY MOUTH EVERY NIGHT AT BEDTIME AS NEEDED FOR SLEEP, Disp: 30 tablet, Rfl: 3  Allergies:  Allergies  Allergen Reactions   Diclofenac Hypertension   Amlodipine Swelling and Other (See Comments)    Limbs swell, not the throat   Lanolin Other (See Comments)    Sneezing and watery eyes. Allergic to [facility]   Tape Other (See Comments)    Redness (Bandaids also)    Past Medical History, Surgical history, Social history,  and Family History were reviewed and updated.  Review of Systems: Review of Systems  Constitutional: Negative.   HENT:  Negative.    Eyes: Negative.   Respiratory: Negative.    Cardiovascular: Negative.   Gastrointestinal: Negative.   Endocrine: Negative.   Genitourinary: Negative.    Musculoskeletal:  Positive for arthralgias.  Skin: Negative.   Neurological: Negative.   Hematological: Negative.   Psychiatric/Behavioral: Negative.      Physical Exam:  height is 4\' 10"  (1.473 m) and weight is 174 lb (78.9 kg). Her oral temperature is 97.8 F (36.6 C). Her blood pressure is 94/38 (abnormal) and her pulse is 53 (abnormal). Her respiration is 18 and oxygen saturation is 100%.   Wt Readings from Last 3 Encounters:  08/22/22 174 lb (78.9 kg)  07/07/22 173 lb (78.5 kg)  05/18/22 163 lb (73.9 kg)    Physical Exam Vitals reviewed.  HENT:     Head: Normocephalic and atraumatic.  Eyes:     Pupils: Pupils are equal, round, and reactive to light.  Cardiovascular:     Rate and Rhythm: Normal rate and regular rhythm.     Heart sounds: Normal heart sounds.  Pulmonary:     Effort: Pulmonary effort is normal.     Breath sounds: Normal breath sounds.  Abdominal:     General: Bowel sounds are normal.     Palpations: Abdomen is soft.  Musculoskeletal:        General: No tenderness or deformity. Normal range of motion.     Cervical back: Normal range of motion.     Comments: Extremities shows the healing surgical scar in the right thigh.  Lymphadenopathy:     Cervical: No cervical adenopathy.  Skin:    General: Skin is warm and dry.     Findings: No erythema or rash.  Neurological:     Mental Status: She is alert and oriented to person, place, and time.  Psychiatric:        Behavior: Behavior normal.        Thought Content: Thought content normal.        Judgment: Judgment normal.      Lab Results  Component Value Date   WBC 5.0 08/22/2022   HGB 11.5 (L) 08/22/2022   HCT  33.7 (L) 08/22/2022   MCV 103.4 (H) 08/22/2022   PLT 223 08/22/2022     Chemistry      Component Value Date/Time   NA 143 07/07/2022 0949   NA 142 05/29/2017 1002   K 3.8 07/07/2022 0949   K 3.9  05/29/2017 1002   CL 103 07/07/2022 0949   CO2 32 07/07/2022 0949   CO2 28 05/29/2017 1002   BUN 27 (H) 07/07/2022 0949   BUN 13.5 05/29/2017 1002   CREATININE 1.10 (H) 07/07/2022 0949   CREATININE 0.81 04/06/2020 1059   CREATININE 0.8 05/29/2017 1002      Component Value Date/Time   CALCIUM 9.2 07/07/2022 0949   CALCIUM 9.4 05/29/2017 1002   ALKPHOS 91 07/07/2022 0949   ALKPHOS 39 (L) 05/29/2017 1002   AST 39 07/07/2022 0949   AST 18 05/29/2017 1002   ALT 42 07/07/2022 0949   ALT 19 05/29/2017 1002   BILITOT 1.0 07/07/2022 0949   BILITOT 0.48 05/29/2017 1002       Impression and Plan: [name] is a very charming 72 year old white female.  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  This was actually a small lesion.  She is on the [place].  She has been on Alecensa for a good 5 months.  We had to repeat another PET scan.  Will do this in February.  She is due for Rivka Barbara also in February.  We will see what the chest x-ray looks like.  If there is something on the chest x-ray that is suspicious, we may have to get a CAT scan on her.  I am glad that her mom is having the 92nd birthday.  This is quite impressive.  I will plan to see [name] back in about 6 weeks.    Josph Macho, MD 1/15/20243:47 PM

## 2022-08-23 ENCOUNTER — Encounter: Payer: Self-pay | Admitting: *Deleted

## 2022-08-23 ENCOUNTER — Ambulatory Visit (INDEPENDENT_AMBULATORY_CARE_PROVIDER_SITE_OTHER): Payer: Medicare HMO | Admitting: Pharmacist

## 2022-08-23 DIAGNOSIS — I10 Essential (primary) hypertension: Secondary | ICD-10-CM

## 2022-08-23 DIAGNOSIS — R7301 Impaired fasting glucose: Secondary | ICD-10-CM

## 2022-08-23 NOTE — Progress Notes (Signed)
Patient needs PET scan. Scheduled for 09/29/2022.  Josph Macho, MD  P Onc Nurse Hp Please call and let her know that there is no pneumonia.  She may have a little bit of congestion in the left lung.  I do not think she needs any inhalers.  She does not need any antibiotics.  There is nothing that suggest cancer.  Pete  Patient also sent MyChart question about CEA level.  Called and spoke to patient. She is aware of CXR results. Also discussed CEA and the current level which is down prior prior levels.   Patient is aware of PET appointment including date, time, and location. The following prep is reviewed with patient and confirmed with teachback: - arrive 30 minutes before appointment time - NPO except water for 6h before scan. No candy, no gum - hold any diabetic medication the morning of the scan - have a low carb dinner the night prior Radiology Information sheet also mailed to patient's home for reinforcement of education.  Oncology Nurse Navigator Documentation     08/23/2022    9:30 AM  Oncology Nurse Navigator Flowsheets  Navigator Follow Up Date: 09/29/2022  Navigator Follow Up Reason: Scan Review  Navigator Location [facility]  Navigator Encounter Type Appt/Treatment Plan Review;Telephone  Telephone Appt Confirmation/Clarification;Education;Outgoing Call  Patient Visit Type MedOnc  Treatment Phase Active Tx  Barriers/Navigation Needs Coordination of Care;Education  Education Understanding Cancer/ Treatment Options;Other  Interventions Coordination of Care;Education  Acuity Level 1-No Barriers  Coordination of Care Radiology  Education Method Verbal;Written  Support Groups/Services Friends and Family  Time Spent with Patient 30

## 2022-08-25 NOTE — Progress Notes (Signed)
Pharmacy Note  08/25/2022 Name: Laura Bush MRN: [id number] DOB: 16-Jul-1951  Subjective: Laura Bush is a 72 y.o. year old female who is a primary care patient of Copland, Gay Filler, MD. Referred to Clinical Pharmacist Practitioner for medication management  Engaged with patient by telephone for follow up visit today.   Hypertension: blood pressure was low at visit with oncology office yesterday and at the visit with onccology in November 2023. Dr Marin Olp recommended patient hold amilodide however patient is hesitant to do this since she checked blood pressure at home yesterday and was 131/73 Patient checks blood pressure 3 to 4 times per week. Ranges form 120 to 145 / 60 to 70 Denies dizziness or lightheadedness.  Current blood pressure medications - amiloride 5mg  daily and hydrochlorothiazide 25mg  daily for blood pressure  / edema.  Patient also has Reynauds Syndrome - she takes nifedipine ER 30mg  daily for this  Constipation - resolved: Taking Alecensa for lung cancer. At previous visit suspected constipation might have been related to either Alecensa or Ozempic. Patient has stopped Ozempic and reports constipation improved. She also mentions that bruising she saw after each injection of Ozempic has improved. She has gained about 10 pounds back but she would prefer to remain off Ozempic for now.   Wt Readings from Last 3 Encounters:  08/22/22 174 lb (78.9 kg)  07/07/22 173 lb (78.5 kg)  05/18/22 163 lb (73.9 kg)     Objective:  Lab Results  Component Value Date   CREATININE 1.08 (H) 08/22/2022   CREATININE 1.10 (H) 07/07/2022   CREATININE 1.00 05/13/2022    Lab Results  Component Value Date   HGBA1C 5.3 01/03/2022       Component Value Date/Time   CHOL 156 10/05/2020 1059   TRIG 182.0 (H) 10/05/2020 1059   HDL 54.30 10/05/2020 1059   CHOLHDL 3 10/05/2020 1059   VLDL 36.4 10/05/2020 1059   LDLCALC 66 10/05/2020 1059   LDLCALC 70 04/06/2020 1059      BP Readings from Last 3 Encounters:  08/22/22 (!) 94/38  07/07/22 (!) 115/45  05/18/22 126/66    Care Plan  Allergies  Allergen Reactions   Diclofenac Hypertension   Amlodipine Swelling and Other (See Comments)    Limbs swell, not the throat   Lanolin Other (See Comments)    Sneezing and watery eyes. Allergic to [facility].   Tape Other (See Comments)    Redness (Bandaids also)    Medications Reviewed Today     Reviewed by Cherre Robins, RPH-CPP (Pharmacist) on 08/23/22 at 1520  Med List Status: <None>   Medication Order Taking? Sig Documenting Provider Last Dose Status Informant  acetaminophen (TYLENOL) 500 MG tablet [id number] Yes Take 1 tablet (500 mg total) by mouth every 8 (eight) hours as needed for mild pain or moderate pain. Ainsley Spinner, PA-C Taking Active   alectinib (ALECENSA) 150 MG capsule [id number] Yes Take 4 capsules (600 mg total) by mouth 2 (two) times daily with a meal. Volanda Napoleon, MD Taking Active   aMILoride (MIDAMOR) 5 MG tablet [id number] Yes TAKE ONE TABLET BY MOUTH DAILY Copland, Gay Filler, MD Taking Active   aspirin EC 81 MG tablet [id number] Yes Take 81 mg by mouth daily. Swallow whole. [provider] Taking Active Multiple Informants  B Complex-C (SUPER B COMPLEX PO) [id number] Yes Take 1 tablet by mouth daily with breakfast. [provider] Taking Active Multiple Informants  Calcium Carb-Cholecalciferol (CALCIUM + D3 PO) [id number] Yes  Take 1 tablet by mouth daily. [provider] Taking Active Multiple Informants  cholecalciferol (VITAMIN D3) 25 MCG (1000 UNIT) tablet [id number] Yes Take 1,000 Units by mouth daily. [provider] Taking Active   diphenhydrAMINE (BENADRYL) 25 mg capsule [id number] Yes Take 25 mg by mouth at bedtime as needed. [provider] Taking Active            Med Note Clydie Braun, Marchele Decock B   Tue Aug 23, 2022  3:19 PM) Rarely needs to take   fexofenadine (ALLEGRA) 180 MG tablet [id number] Yes  Take 180 mg by mouth in the morning. [provider] Taking Active Multiple Informants  folic acid (FOLATE) 400 MCG tablet [id number] Yes Take 400 mcg by mouth daily. [provider] Taking Active   hydrochlorothiazide (HYDRODIURIL) 25 MG tablet [id number] Yes Take 25 mg by mouth daily. [provider] Taking Active   ipratropium (ATROVENT) 0.06 % nasal spray [id number] No Place 2 sprays into both nostrils 4 (four) times daily. Use as needed  Patient not taking: Reported on 08/23/2022   Copland, Gwenlyn Found, MD Not Taking Active Multiple Informants  Lactulose 20 GM/30ML SOLN [id number] No Take 30 mLs (20 g total) by mouth every 2 (two) hours as needed. Take q2h until BM as needed for severe constipation.  Patient not taking: Reported on 08/23/2022   Josph Macho, MD Not Taking Active   NIFEdipine (PROCARDIA-XL/NIFEDICAL-XL) 30 MG 24 hr tablet [id number] Yes TAKE ONE TABLET BY MOUTH DAILY Copland, Gwenlyn Found, MD Taking Active   pantoprazole (PROTONIX) 40 MG tablet [id number] Yes TAKE ONE TABLET BY MOUTH DAILY Copland, Gwenlyn Found, MD Taking Active   polyethylene glycol (MIRALAX / GLYCOLAX) 17 g packet [id number] Yes Take 17 g by mouth daily. [provider] Taking Active   simvastatin (ZOCOR) 20 MG tablet [id number] Yes Take 1 tablet (20 mg total) by mouth daily. 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/29/2017   Vitamin D deficiency 01/29/2016   Eczema 12/29/2014   Venous insufficiency 10/16/2014   Essential hypertension 10/01/2014   Hypercholesteremia 10/01/2014   GERD (gastroesophageal reflux disease) 10/01/2014   Other bilateral bundle branch block 06/23/2003   Assessment:  Metabolic syndrome - stable Constipation - resolved possibly related to ozempic  Hypertension with recent low blood pressure during oncology office  visit; blood pressure goal < 140/90  Plan:  Patient will continue current blood pressure regimen. She will come to office later this week and being her blood pressure cuff for comparison to office blood pressure cuff.  Reviewed med list and updated.  Reviewed refills and adherence.  Reminded patient to get yearly eye exam - patient plans to make appointment soon.   Henrene Pastor, PharmD Clinical Pharmacist [facility]

## 2022-08-26 ENCOUNTER — Ambulatory Visit (INDEPENDENT_AMBULATORY_CARE_PROVIDER_SITE_OTHER): Payer: Medicare HMO | Admitting: Pharmacist

## 2022-08-26 VITALS — BP 128/68 | HR 70

## 2022-08-26 DIAGNOSIS — I10 Essential (primary) hypertension: Secondary | ICD-10-CM | POA: Diagnosis not present

## 2022-08-26 NOTE — Progress Notes (Signed)
Pharmacy Note  08/26/2022 Name: Laura Bush MRN: [id number] DOB: 03-06-1951  Subjective: Laura Bush is a 72 y.o. year old female who is a primary care patient of Copland, Gay Filler, MD. Referred to Clinical Pharmacist Practitioner for medication management  Engaged with patient face to face for follow up visit today.   Vitals:   08/26/22 0906 08/26/22 0911  BP: 124/70 128/68  Pulse: 68 70    Hypertension: blood pressure was low at visit with oncology office last week and at previous visit November 2023. Dr Marin Olp recommended patient hold amilodide however patient was hesitant to do this since she checked blood pressure at home and was 131/73 Patient checks blood pressure at home. Patient has [employer] blood pressure cuff with phone app to record BP Recent blood pressure readings at home: 08/25/22 was 133/79 and HR 51 08/23/22 was 131/72 and HR 59 08/15/22 was 131/75 and HR 67  Denies dizziness or lightheadedness.  Current blood pressure medications - amiloride 5mg  daily and hydrochlorothiazide 25mg  daily for blood pressure  / edema.  Patient also has Reynauds Syndrome - she takes nifedipine ER 30mg  daily for this    Wt Readings from Last 3 Encounters:  08/22/22 174 lb (78.9 kg)  07/07/22 173 lb (78.5 kg)  05/18/22 163 lb (73.9 kg)     Objective:  Lab Results  Component Value Date   CREATININE 1.08 (H) 08/22/2022   CREATININE 1.10 (H) 07/07/2022   CREATININE 1.00 05/13/2022    Lab Results  Component Value Date   HGBA1C 5.3 01/03/2022       Component Value Date/Time   CHOL 156 10/05/2020 1059   TRIG 182.0 (H) 10/05/2020 1059   HDL 54.30 10/05/2020 1059   CHOLHDL 3 10/05/2020 1059   VLDL 36.4 10/05/2020 1059   LDLCALC 66 10/05/2020 1059   LDLCALC 70 04/06/2020 1059     BP Readings from Last 3 Encounters:  08/26/22 128/68  08/22/22 (!) 94/38  07/07/22 (!) 115/45    Care Plan  Allergies  Allergen Reactions   Diclofenac Hypertension    Amlodipine Swelling and Other (See Comments)    Limbs swell, not the throat   Lanolin Other (See Comments)    Sneezing and watery eyes. Allergic to [facility].   Tape Other (See Comments)    Redness (Bandaids also)    Medications Reviewed Today     Reviewed by Cherre Robins, RPH-CPP (Pharmacist) on 08/23/22 at 1520  Med List Status: <None>   Medication Order Taking? Sig Documenting Provider Last Dose Status Informant  acetaminophen (TYLENOL) 500 MG tablet [id number] Yes Take 1 tablet (500 mg total) by mouth every 8 (eight) hours as needed for mild pain or moderate pain. Ainsley Spinner, PA-C Taking Active   alectinib (ALECENSA) 150 MG capsule [id number] Yes Take 4 capsules (600 mg total) by mouth 2 (two) times daily with a meal. Volanda Napoleon, MD Taking Active   aMILoride (MIDAMOR) 5 MG tablet [id number] Yes TAKE ONE TABLET BY MOUTH DAILY Copland, Gay Filler, MD Taking Active   aspirin EC 81 MG tablet [id number] Yes Take 81 mg by mouth daily. Swallow whole. [provider] Taking Active Multiple Informants  B Complex-C (SUPER B COMPLEX PO) [id number] Yes Take 1 tablet by mouth daily with breakfast. [provider] Taking Active Multiple Informants  Calcium Carb-Cholecalciferol (CALCIUM + D3 PO) [id number] Yes Take 1 tablet by mouth daily. [provider] Taking Active Multiple Informants  cholecalciferol (VITAMIN D3) 25 MCG (1000 UNIT)  tablet [id number] Yes Take 1,000 Units by mouth daily. [provider] Taking Active   diphenhydrAMINE (BENADRYL) 25 mg capsule [id number] Yes Take 25 mg by mouth at bedtime as needed. [provider] Taking Active            Med Note Antony Contras, Maralyn Witherell B   Tue Aug 23, 2022  3:19 PM) Rarely needs to take   fexofenadine (ALLEGRA) 180 MG tablet [id number] Yes Take 180 mg by mouth in the morning. [provider] Taking Active Multiple Informants  folic acid (FOLATE) 277 MCG tablet [id number] Yes Take 400 mcg by mouth daily.  [provider] Taking Active   hydrochlorothiazide (HYDRODIURIL) 25 MG tablet [id number] Yes Take 25 mg by mouth daily. [provider] Taking Active   ipratropium (ATROVENT) 0.06 % nasal spray [id number] No Place 2 sprays into both nostrils 4 (four) times daily. Use as needed  Patient not taking: Reported on 08/23/2022   Copland, Gay Filler, MD Not Taking Active Multiple Informants  Lactulose 20 GM/30ML SOLN [id number] No Take 30 mLs (20 g total) by mouth every 2 (two) hours as needed. Take q2h until BM as needed for severe constipation.  Patient not taking: Reported on 08/23/2022   Volanda Napoleon, MD Not Taking Active   NIFEdipine (PROCARDIA-XL/NIFEDICAL-XL) 30 MG 24 hr tablet [id number] Yes TAKE ONE TABLET BY MOUTH DAILY Copland, Gay Filler, MD Taking Active   pantoprazole (PROTONIX) 40 MG tablet [id number] Yes TAKE ONE TABLET BY MOUTH DAILY Copland, Gay Filler, MD Taking Active   polyethylene glycol (MIRALAX / GLYCOLAX) 17 g packet [id number] Yes Take 17 g by mouth daily. [provider] Taking Active   simvastatin (ZOCOR) 20 MG tablet [id number] Yes Take 1 tablet (20 mg total) by mouth daily. 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 ([place]) 01/14/2022   Goals of care, counseling/discussion 01/14/2022   Laceration of left eyebrow 01/04/2022   Mass of lower lobe of left lung 01/04/2022   Aortic atherosclerosis ([place]) 01/04/2022   CAD (coronary artery disease) 01/04/2022   Pathologic fracture of femur ([place]) 01/03/2022   OSA on CPAP 01/03/2022   Impaired fasting glucose, with polyphagia 12/15/2021   Hemochromatosis 01/21/2021    Obesity (BMI 30-39.9) 22/97/9892   Metabolic syndrome 11/94/1740   Mixed hyperlipidemia 04/21/2020   Polyp of ascending colon 04/21/2020   History of breast cancer, Tamoxifen, nearing five years, Dr. Griffith Citron 04/21/2020   Osteopenia 10/02/2018   Malignant 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, estrogen receptor positive ([place]) 05/29/2017   Ductal carcinoma in situ (DCIS) of right breast 05/29/2017   Vitamin D deficiency 01/29/2016   Eczema 12/29/2014   Venous insufficiency 10/16/2014   Essential hypertension 10/01/2014   Hypercholesteremia 10/01/2014   GERD (gastroesophageal reflux disease) 10/01/2014   Other bilateral bundle branch block 06/23/2003   Assessment:  Hypertension with recent low blood pressure during oncology office visit; blood pressure goal < 140/90. Blood pressure today was at goal  Plan:  Current current blood pressure lowering medications - amiloride 5mg  daily, hydrochlorothiazide 25mg  daily and nifedipine ER 30mg  daily (  for Raynaud's Syndrome) Continue to check blood pressure 2 to 3 times per week. Call office if blood pressure < 100/60 or if she has symptoms of low blood pressure - dizziness, fatigue, blurry vision, weakness.    Henrene Pastor, PharmD Clinical Pharmacist [facility]

## 2022-08-28 ENCOUNTER — Other Ambulatory Visit: Payer: Self-pay | Admitting: Family Medicine

## 2022-09-05 DIAGNOSIS — G4733 Obstructive sleep apnea (adult) (pediatric): Secondary | ICD-10-CM | POA: Diagnosis not present

## 2022-09-05 NOTE — Telephone Encounter (Signed)
Oral Oncology Patient Advocate Encounter   Received notification re-enrollment for assistance for Alecensa through [employer] has been approved. Patient may continue to receive their medication at $0 from this program.    [employer]'s phone number [phone].   Effective dates: 08/08/22 until further notice.  I have spoken to the patient.  Berdine Addison, [facility] Patient [place]  [phone] (phone) [phone] (fax) 09/05/2022 9:58 AM

## 2022-09-07 DIAGNOSIS — M84453A Pathological fracture, unspecified femur, initial encounter for fracture: Secondary | ICD-10-CM | POA: Diagnosis not present

## 2022-09-07 DIAGNOSIS — R918 Other nonspecific abnormal finding of lung field: Secondary | ICD-10-CM | POA: Diagnosis not present

## 2022-09-07 DIAGNOSIS — G4733 Obstructive sleep apnea (adult) (pediatric): Secondary | ICD-10-CM | POA: Diagnosis not present

## 2022-09-07 DIAGNOSIS — I700 Atherosclerosis of aorta: Secondary | ICD-10-CM | POA: Diagnosis not present

## 2022-09-12 DIAGNOSIS — Z0100 Encounter for examination of eyes and vision without abnormal findings: Secondary | ICD-10-CM | POA: Diagnosis not present

## 2022-09-12 NOTE — Progress Notes (Unsigned)
TeleHealth Visit:  This visit was completed with telemedicine (audio/video) technology. Laura Bush has verbally consented to this TeleHealth visit. The patient is located at home, the provider is located at home. The participants in this visit include the listed provider and patient. The visit was conducted today via MyChart video.  OBESITY Laura Bush is here to discuss her progress with her obesity treatment plan along with follow-up of her obesity related diagnoses.    Today's visit was # 28  Starting weight: 196 lbs Starting date: 10/17/2019 Weight reported at last virtual office visit: 157.6 lbs on 05/10/22 Today's reported weight: No weight reported.  Nutrition Plan: practicing portion control and making smarter food choices, such as increasing vegetables and decreasing simple carbohydrates.   Current exercise:  yoga  Interim History: Laura Bush returns to care today after a hiatus.  Her last virtual visit was 05/10/2022. She took a break because she was dealing with stage IV lung cancer which had metastasized to her bone.   She has gained about 10 pounds since September and would like to start working on a healthier diet. Her husband is also a patient and is currently journaling. She was on Ozempic in the past but does not wish to restart due to bruising.  Typical day of food: Breakfast:  veggie frittata, slice of bread, 8 ounces of fair life milk  or yogurt and berries  or toast with peanut butter and berries  Lunch: Cabbage soup with kale salad (oil and vinegar dressing)  Dinner: Cream barbecue, cauliflower rice or roasted chicken, vegetables, salad with oil and vinegar dressing  She typically snacks on cheese sticks and sometimes about 6 wheat thins. Drinks water with a little bit of Liquid IV added.  We discussed options and she would like a less structured diet.  I will start her back on portion control/smart choices. Assessment/Plan:  1. Lung cancer metastasis to  bone On Memorial Day weekend she suffered a fractured femur.  This was found to be a pathologic fracture caused by metastatic lung cancer that she was unaware that she had. She had breast cancer 7 years ago and has been in remission from that.  She has completed radiation is now on a targeted therapy (alectinib) for her cancer.  She sees Dr. Burney Gauze.  PET scan on 06/27/2022 showed no sign of cancer.  She will have repeat PET scan on 09/29/2022  Plan: Follow-up with Dr. Burney Gauze as directed.  2. Insulin Resistance Laura Bush has had elevated fasting insulin readings.  Last fasting insulin was 12.6 back in 2021.  A1c was 5.3 on 01/03/2022. She  denies polyphagia but has noticed she is hungrier without the Ozempic.  Has also been on metformin for polyphagia. Medication(s): none Lab Results  Component Value Date   HGBA1C 5.3 01/03/2022   Lab Results  Component Value Date   INSULIN 12.6 10/17/2019    Plan Reduce simple carbohydrates in diet. Focus on increasing protein intake. Recheck labs within the next few months.  3. Obesity: Current BMI 36 Laura Bush is currently in the action stage of change. As such, her goal is to continue with weight loss efforts.  She has agreed to practicing portion control and making smarter food choices, such as increasing vegetables and decreasing simple carbohydrates.   1.  Work on reducing simple carbohydrates in diet. 2.  Measure portions on oils. 3.  Have protein at every meal. 4.  If having rice or potato, limit serving size to 1/2 cup. 5.  Keep snack  calories less than 300/day. 6.  Category 2 plan sent via MyChart to use as a guideline.  Exercise goals: Yoga twice weekly  Behavioral modification strategies: increasing lean protein intake, decreasing simple carbohydrates, meal planning and cooking strategies, and planning for success.  Laura Bush has agreed to follow-up with our clinic in 6 weeks.   No orders of the defined types were placed in  this encounter.   There are no discontinued medications.   No orders of the defined types were placed in this encounter.     Objective:   VITALS: Per patient if applicable, see vitals. GENERAL: Alert and in no acute distress. CARDIOPULMONARY: No increased WOB. Speaking in clear sentences.  PSYCH: Pleasant and cooperative. Speech normal rate and rhythm. Affect is appropriate. Insight and judgement are appropriate. Attention is focused, linear, and appropriate.  NEURO: Oriented as arrived to appointment on time with no prompting.   Lab Results  Component Value Date   CREATININE 1.08 (H) 08/22/2022   BUN 25 (H) 08/22/2022   NA 141 08/22/2022   K 4.3 08/22/2022   CL 104 08/22/2022   CO2 29 08/22/2022   Lab Results  Component Value Date   ALT 25 08/22/2022   AST 30 08/22/2022   ALKPHOS 77 08/22/2022   BILITOT 0.9 08/22/2022   Lab Results  Component Value Date   HGBA1C 5.3 01/03/2022   HGBA1C 5.2 04/06/2020   HGBA1C 5.6 04/03/2019   HGBA1C 5.6 03/28/2018   Lab Results  Component Value Date   INSULIN 12.6 10/17/2019   Lab Results  Component Value Date   TSH 0.88 10/05/2020   Lab Results  Component Value Date   CHOL 156 10/05/2020   HDL 54.30 10/05/2020   LDLCALC 66 10/05/2020   TRIG 182.0 (H) 10/05/2020   CHOLHDL 3 10/05/2020   Lab Results  Component Value Date   WBC 5.0 08/22/2022   HGB 11.5 (L) 08/22/2022   HCT 33.7 (L) 08/22/2022   MCV 103.4 (H) 08/22/2022   PLT 223 08/22/2022   Lab Results  Component Value Date   IRON 91 02/16/2022   TIBC 407 02/16/2022   FERRITIN 28 02/16/2022   Lab Results  Component Value Date   VD25OH 32.61 01/05/2022   VD25OH 44.11 10/05/2020   VD25OH 38.52 04/03/2019    Attestation Statements:   Reviewed by clinician on day of visit: allergies, medications, problem list, medical history, surgical history, family history, social history, and previous encounter notes.  Time spent on visit including the items listed  below was 30 minutes.  -preparing to see the patient (e.g., review of tests, history, previous notes) -obtaining and/or reviewing separately obtained history -counseling and educating the patient/family/caregiver -documenting clinical information in the electronic or other health record -ordering medications, tests, or procedures -independently interpreting results and communicating results to the patient/ family/caregiver -referring and communicating with other health care professionals  -care coordination

## 2022-09-13 ENCOUNTER — Encounter (INDEPENDENT_AMBULATORY_CARE_PROVIDER_SITE_OTHER): Payer: Self-pay | Admitting: Family Medicine

## 2022-09-13 ENCOUNTER — Encounter: Payer: Self-pay | Admitting: *Deleted

## 2022-09-13 ENCOUNTER — Telehealth (INDEPENDENT_AMBULATORY_CARE_PROVIDER_SITE_OTHER): Payer: Medicare HMO | Admitting: Family Medicine

## 2022-09-13 DIAGNOSIS — C3490 Malignant neoplasm of unspecified part of unspecified bronchus or lung: Secondary | ICD-10-CM | POA: Diagnosis not present

## 2022-09-13 DIAGNOSIS — E88819 Insulin resistance, unspecified: Secondary | ICD-10-CM | POA: Insufficient documentation

## 2022-09-13 DIAGNOSIS — C7951 Secondary malignant neoplasm of bone: Secondary | ICD-10-CM

## 2022-09-13 DIAGNOSIS — E669 Obesity, unspecified: Secondary | ICD-10-CM

## 2022-09-13 DIAGNOSIS — Z6836 Body mass index (BMI) 36.0-36.9, adult: Secondary | ICD-10-CM

## 2022-09-14 ENCOUNTER — Other Ambulatory Visit: Payer: Self-pay

## 2022-09-22 ENCOUNTER — Encounter: Payer: Self-pay | Admitting: Family Medicine

## 2022-09-22 DIAGNOSIS — R7301 Impaired fasting glucose: Secondary | ICD-10-CM

## 2022-09-22 DIAGNOSIS — E782 Mixed hyperlipidemia: Secondary | ICD-10-CM

## 2022-09-22 DIAGNOSIS — I10 Essential (primary) hypertension: Secondary | ICD-10-CM

## 2022-09-23 ENCOUNTER — Encounter: Payer: Self-pay | Admitting: *Deleted

## 2022-09-26 ENCOUNTER — Ambulatory Visit
Admission: RE | Admit: 2022-09-26 | Discharge: 2022-09-26 | Disposition: A | Discharge status: Home / Self Care | Payer: Medicare HMO | Source: Ambulatory Visit | Attending: Family Medicine | Admitting: Family Medicine

## 2022-09-26 DIAGNOSIS — Z1231 Encounter for screening mammogram for malignant neoplasm of breast: Secondary | ICD-10-CM | POA: Diagnosis not present

## 2022-09-29 ENCOUNTER — Encounter: Payer: Self-pay | Admitting: *Deleted

## 2022-09-29 ENCOUNTER — Encounter (HOSPITAL_COMMUNITY)
Admission: RE | Admit: 2022-09-29 | Discharge: 2022-09-29 | Disposition: A | Discharge status: Home / Self Care | Payer: Medicare HMO | Source: Ambulatory Visit | Attending: Hematology & Oncology | Admitting: Hematology & Oncology

## 2022-09-29 DIAGNOSIS — K8020 Calculus of gallbladder without cholecystitis without obstruction: Secondary | ICD-10-CM | POA: Diagnosis not present

## 2022-09-29 DIAGNOSIS — C7951 Secondary malignant neoplasm of bone: Secondary | ICD-10-CM | POA: Diagnosis not present

## 2022-09-29 DIAGNOSIS — C3490 Malignant neoplasm of unspecified part of unspecified bronchus or lung: Secondary | ICD-10-CM | POA: Insufficient documentation

## 2022-09-29 DIAGNOSIS — I2510 Atherosclerotic heart disease of native coronary artery without angina pectoris: Secondary | ICD-10-CM | POA: Diagnosis not present

## 2022-09-29 DIAGNOSIS — M84451A Pathological fracture, right femur, initial encounter for fracture: Secondary | ICD-10-CM | POA: Diagnosis not present

## 2022-09-29 LAB — GLUCOSE, CAPILLARY: Glucose-Capillary: 84 mg/dL (ref 70–99)

## 2022-09-29 MED ORDER — FLUDEOXYGLUCOSE F - 18 (FDG) INJECTION
8.6000 | Freq: Once | INTRAVENOUS | Status: AC
  Administered 2022-09-29: 8.6 via INTRAVENOUS

## 2022-09-30 ENCOUNTER — Encounter: Payer: Self-pay | Admitting: *Deleted

## 2022-09-30 NOTE — Progress Notes (Signed)
Reviewed PET scan which shows decreased activity and negative for any disease progression.   Oncology Nurse Navigator Documentation     09/30/2022   10:30 AM  Oncology Nurse Navigator Flowsheets  Navigator Follow Up Date: 10/03/2022  Navigator Follow Up Reason: Follow-up Appointment  Navigator Location [facility]  Navigator Encounter Type Scan Review  Patient Visit Type MedOnc  Treatment Phase Active Tx  Barriers/Navigation Needs Coordination of Care;Education  Interventions None Required  Acuity Level 1-No Barriers  Support Groups/Services Friends and Family  Time Spent with Patient 15

## 2022-10-03 ENCOUNTER — Inpatient Hospital Stay: Payer: Medicare HMO | Admitting: Hematology & Oncology

## 2022-10-03 ENCOUNTER — Encounter: Payer: Self-pay | Admitting: Hematology & Oncology

## 2022-10-03 ENCOUNTER — Inpatient Hospital Stay: Payer: Medicare HMO | Attending: Hematology & Oncology

## 2022-10-03 ENCOUNTER — Inpatient Hospital Stay: Payer: Medicare HMO

## 2022-10-03 ENCOUNTER — Other Ambulatory Visit: Payer: Self-pay

## 2022-10-03 ENCOUNTER — Encounter: Payer: Self-pay | Admitting: *Deleted

## 2022-10-03 VITALS — BP 144/70 | HR 55 | Temp 97.7°F | Resp 20 | Ht <= 58 in | Wt 177.1 lb

## 2022-10-03 DIAGNOSIS — C3490 Malignant neoplasm of unspecified part of unspecified bronchus or lung: Secondary | ICD-10-CM

## 2022-10-03 DIAGNOSIS — Z79899 Other long term (current) drug therapy: Secondary | ICD-10-CM | POA: Diagnosis not present

## 2022-10-03 DIAGNOSIS — C7951 Secondary malignant neoplasm of bone: Secondary | ICD-10-CM

## 2022-10-03 DIAGNOSIS — C3432 Malignant neoplasm of lower lobe, left bronchus or lung: Secondary | ICD-10-CM | POA: Diagnosis not present

## 2022-10-03 LAB — CBC WITH DIFFERENTIAL (CANCER CENTER ONLY)
Abs Immature Granulocytes: 0.02 10*3/uL (ref 0.00–0.07)
Basophils Absolute: 0 10*3/uL (ref 0.0–0.1)
Basophils Relative: 1 %
Eosinophils Absolute: 0.1 10*3/uL (ref 0.0–0.5)
Eosinophils Relative: 3 %
HCT: 33.8 % — ABNORMAL LOW (ref 36.0–46.0)
Hemoglobin: 11.5 g/dL — ABNORMAL LOW (ref 12.0–15.0)
Immature Granulocytes: 1 %
Lymphocytes Relative: 29 %
Lymphs Abs: 1 10*3/uL (ref 0.7–4.0)
MCH: 34.1 pg — ABNORMAL HIGH (ref 26.0–34.0)
MCHC: 34 g/dL (ref 30.0–36.0)
MCV: 100.3 fL — ABNORMAL HIGH (ref 80.0–100.0)
Monocytes Absolute: 0.5 10*3/uL (ref 0.1–1.0)
Monocytes Relative: 13 %
Neutro Abs: 2 10*3/uL (ref 1.7–7.7)
Neutrophils Relative %: 53 %
Platelet Count: 196 10*3/uL (ref 150–400)
RBC: 3.37 MIL/uL — ABNORMAL LOW (ref 3.87–5.11)
RDW: 13.3 % (ref 11.5–15.5)
WBC Count: 3.6 10*3/uL — ABNORMAL LOW (ref 4.0–10.5)
nRBC: 0 % (ref 0.0–0.2)

## 2022-10-03 LAB — CMP (CANCER CENTER ONLY)
ALT: 27 U/L (ref 0–44)
AST: 31 U/L (ref 15–41)
Albumin: 4.4 g/dL (ref 3.5–5.0)
Alkaline Phosphatase: 80 U/L (ref 38–126)
Anion gap: 9 (ref 5–15)
BUN: 22 mg/dL (ref 8–23)
CO2: 30 mmol/L (ref 22–32)
Calcium: 9.6 mg/dL (ref 8.9–10.3)
Chloride: 102 mmol/L (ref 98–111)
Creatinine: 1.09 mg/dL — ABNORMAL HIGH (ref 0.44–1.00)
GFR, Estimated: 54 mL/min — ABNORMAL LOW (ref >60)
Glucose, Bld: 97 mg/dL (ref 70–99)
Potassium: 3.9 mmol/L (ref 3.5–5.1)
Sodium: 141 mmol/L (ref 135–145)
Total Bilirubin: 0.8 mg/dL (ref 0.3–1.2)
Total Protein: 6.8 g/dL (ref 6.5–8.1)

## 2022-10-03 LAB — CEA (ACCESS): CEA (CHCC): 14.99 ng/mL — ABNORMAL HIGH (ref 0.00–5.00)

## 2022-10-03 LAB — LACTATE DEHYDROGENASE: LDH: 255 U/L — ABNORMAL HIGH (ref 98–192)

## 2022-10-03 NOTE — Progress Notes (Signed)
Hematology and Oncology Follow Up Visit  Laura Bush [id number] 1951-07-02 72 y.o. 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 3 months  -next dose in 11/2022  S/P SBRT to LUL -- completed on 03/04/2022 S/P XRT to RIGHT femur -- completed on 03/04/2022 Alecensa 600 mg po BID -- start on 03/24/2022     Interim History:  Laura Bush is back for follow-up.  She is doing quite nicely.  She had a good weekend.  She and her husband will be going to [place] this week to help babysit.  Her mother is now on Hospice down in [place].  Sounds like this was a necessary step to help her out.  I know that she is quite elderly and does have dementia.  We did go ahead and do a PET scan on Laura Bush.  This was done on 09/29/2022.  The PET scan did not show any evidence of progressive disease.  She had decreased hypermetabolism with the pathologic fracture of the right femoral shaft.  She says she does have some spasms in the right hamstring.  This does not happen all the time.  She has had no weakness.  She has had no change in bowel or bladder habits.  She says that she has had a little bit of constipation.  She has had no fever.  There is been no cough or shortness of breath.  She is about to have a dental procedure done.  As such, will get have to hold off on the Xgeva.    Overall, I would say that her performance status is probably ECOG 1.    Medications:  Current Outpatient Medications:    acetaminophen (TYLENOL) 500 MG tablet, Take 1 tablet (500 mg total) by mouth every 8 (eight) hours as needed for mild pain or moderate pain., Disp: 30 tablet, Rfl: 0   alectinib (ALECENSA) 150 MG capsule, Take 4 capsules (600 mg total) by mouth 2 (two) times daily with a meal., Disp: 240 capsule, Rfl: 6   aMILoride (MIDAMOR)  5 MG tablet, TAKE ONE TABLET BY MOUTH DAILY, Disp: 90 tablet, Rfl: 3   aspirin EC 81 MG tablet, Take 81 mg by mouth daily. Swallow whole., Disp: , Rfl:    B Complex-C (SUPER B COMPLEX PO), Take 1 tablet by mouth daily with breakfast., Disp: , Rfl:    Calcium Carb-Cholecalciferol (CALCIUM + D3 PO), Take 1 tablet by mouth daily., Disp: , Rfl:    cholecalciferol (VITAMIN D3) 25 MCG (1000 UNIT) tablet, Take 1,000 Units by mouth daily., Disp: , Rfl:    fexofenadine (ALLEGRA) 180 MG tablet, Take 180 mg by mouth in the morning., Disp: , Rfl:    folic acid (FOLATE) A999333 MCG tablet, Take 400 mcg by mouth daily., Disp: , Rfl:    hydrochlorothiazide (HYDRODIURIL) 25 MG tablet, Take 25 mg by mouth daily., Disp: , Rfl:    metFORMIN (GLUCOPHAGE) 500 MG tablet, Take 500 mg by mouth daily., Disp: , Rfl:    NIFEdipine (PROCARDIA-XL/NIFEDICAL-XL) 30 MG 24 hr tablet, TAKE 1 TABLET BY MOUTH DAILY, Disp: 90 tablet, Rfl: 1   pantoprazole (PROTONIX) 40 MG tablet, TAKE ONE TABLET BY MOUTH DAILY, Disp: 90 tablet, Rfl: 3   polyethylene glycol (MIRALAX / GLYCOLAX) 17 g packet, Take 17 g by mouth daily., Disp: ,  Rfl:    simvastatin (ZOCOR) 20 MG tablet, Take 1 tablet (20 mg total) by mouth daily., Disp: 100 tablet, Rfl: 3   traZODone (DESYREL) 50 MG tablet, TAKE ONE TABLET BY MOUTH EVERY NIGHT AT BEDTIME AS NEEDED FOR SLEEP, Disp: 30 tablet, Rfl: 3   diphenhydrAMINE (BENADRYL) 25 mg capsule, Take 25 mg by mouth at bedtime as needed. (Patient not taking: Reported on 10/03/2022), Disp: , Rfl:    ipratropium (ATROVENT) 0.06 % nasal spray, Place 2 sprays into both nostrils 4 (four) times daily. Use as needed (Patient not taking: Reported on 10/03/2022), Disp: 15 mL, Rfl: 4  Allergies:  Allergies  Allergen Reactions   Diclofenac Hypertension   Amlodipine Swelling and Other (See Comments)    Limbs swell, not the throat   Lanolin Other (See Comments)    Sneezing and watery eyes. Allergic to [facility].   Tape Other (See Comments)     Redness (Bandaids also)    Past Medical History, Surgical history, Social history, and Family History were reviewed and updated.  Review of Systems: Review of Systems  Constitutional: Negative.   HENT:  Negative.    Eyes: Negative.   Respiratory: Negative.    Cardiovascular: Negative.   Gastrointestinal: Negative.   Endocrine: Negative.   Genitourinary: Negative.    Musculoskeletal:  Positive for arthralgias.  Skin: Negative.   Neurological: Negative.   Hematological: Negative.   Psychiatric/Behavioral: Negative.      Physical Exam:  height is '4\' 10"'$  (1.473 m) and weight is 177 lb 1.9 oz (80.3 kg). Her oral temperature is 97.7 F (36.5 C). Her blood pressure is 144/70 (abnormal) and her pulse is 55 (abnormal). Her respiration is 20 and oxygen saturation is 100%.   Wt Readings from Last 3 Encounters:  10/03/22 177 lb 1.9 oz (80.3 kg)  08/22/22 174 lb (78.9 kg)  07/07/22 173 lb (78.5 kg)    Physical Exam Vitals reviewed.  HENT:     Head: Normocephalic and atraumatic.  Eyes:     Pupils: Pupils are equal, round, and reactive to light.  Cardiovascular:     Rate and Rhythm: Normal rate and regular rhythm.     Heart sounds: Normal heart sounds.  Pulmonary:     Effort: Pulmonary effort is normal.     Breath sounds: Normal breath sounds.  Abdominal:     General: Bowel sounds are normal.     Palpations: Abdomen is soft.  Musculoskeletal:        General: No tenderness or deformity. Normal range of motion.     Cervical back: Normal range of motion.     Comments: Extremities shows the healing surgical scar in the right thigh.  Lymphadenopathy:     Cervical: No cervical adenopathy.  Skin:    General: Skin is warm and dry.     Findings: No erythema or rash.  Neurological:     Mental Status: She is alert and oriented to person, place, and time.  Psychiatric:        Behavior: Behavior normal.        Thought Content: Thought content normal.        Judgment: Judgment normal.       Lab Results  Component Value Date   WBC 3.6 (L) 10/03/2022   HGB 11.5 (L) 10/03/2022   HCT 33.8 (L) 10/03/2022   MCV 100.3 (H) 10/03/2022   PLT 196 10/03/2022     Chemistry      Component Value Date/Time   NA 141  08/22/2022 1516   NA 142 05/29/2017 1002   K 4.3 08/22/2022 1516   K 3.9 05/29/2017 1002   CL 104 08/22/2022 1516   CO2 29 08/22/2022 1516   CO2 28 05/29/2017 1002   BUN 25 (H) 08/22/2022 1516   BUN 13.5 05/29/2017 1002   CREATININE 1.08 (H) 08/22/2022 1516   CREATININE 0.81 04/06/2020 1059   CREATININE 0.8 05/29/2017 1002      Component Value Date/Time   CALCIUM 9.7 08/22/2022 1516   CALCIUM 9.4 05/29/2017 1002   ALKPHOS 77 08/22/2022 1516   ALKPHOS 39 (L) 05/29/2017 1002   AST 30 08/22/2022 1516   AST 18 05/29/2017 1002   ALT 25 08/22/2022 1516   ALT 19 05/29/2017 1002   BILITOT 0.9 08/22/2022 1516   BILITOT 0.48 05/29/2017 1002       Impression and Plan: Laura Bush is a very charming 72 year old white female.  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  This was actually a small lesion.  She is on the [place].  She has been on Alecensa for a good 7 months.  I do not think we need another PET scan probably until May or June.  We will go ahead and plan to get her back in about 6 weeks.  We will see if she is ready for Xgeva at that time.    Volanda Napoleon, MD 2/26/20249:57 AM

## 2022-10-03 NOTE — Progress Notes (Signed)
Oncology Nurse Navigator Documentation     10/03/2022    9:45 AM  Oncology Nurse Navigator Flowsheets  Navigator Follow Up Date: 11/14/2022  Navigator Follow Up Reason: Follow-up Appointment  Navigator Location [facility]  Navigator Encounter Type Follow-up Appt  Patient Visit Type MedOnc  Treatment Phase Active Tx  Barriers/Navigation Needs No Barriers At This Time  Interventions None Required  Acuity Level 1-No Barriers  Support Groups/Services Friends and Family  Time Spent with Patient 15

## 2022-10-06 DIAGNOSIS — I700 Atherosclerosis of aorta: Secondary | ICD-10-CM | POA: Diagnosis not present

## 2022-10-06 DIAGNOSIS — G4733 Obstructive sleep apnea (adult) (pediatric): Secondary | ICD-10-CM | POA: Diagnosis not present

## 2022-10-06 DIAGNOSIS — M84453A Pathological fracture, unspecified femur, initial encounter for fracture: Secondary | ICD-10-CM | POA: Diagnosis not present

## 2022-10-06 DIAGNOSIS — R918 Other nonspecific abnormal finding of lung field: Secondary | ICD-10-CM | POA: Diagnosis not present

## 2022-10-17 ENCOUNTER — Encounter: Payer: Self-pay | Admitting: *Deleted

## 2022-10-18 DIAGNOSIS — J45909 Unspecified asthma, uncomplicated: Secondary | ICD-10-CM | POA: Diagnosis not present

## 2022-10-18 DIAGNOSIS — Z008 Encounter for other general examination: Secondary | ICD-10-CM | POA: Diagnosis not present

## 2022-10-18 DIAGNOSIS — R32 Unspecified urinary incontinence: Secondary | ICD-10-CM | POA: Diagnosis not present

## 2022-10-18 DIAGNOSIS — K5900 Constipation, unspecified: Secondary | ICD-10-CM | POA: Diagnosis not present

## 2022-10-18 DIAGNOSIS — G4733 Obstructive sleep apnea (adult) (pediatric): Secondary | ICD-10-CM | POA: Diagnosis not present

## 2022-10-18 DIAGNOSIS — M1990 Unspecified osteoarthritis, unspecified site: Secondary | ICD-10-CM | POA: Diagnosis not present

## 2022-10-18 DIAGNOSIS — M8580 Other specified disorders of bone density and structure, unspecified site: Secondary | ICD-10-CM | POA: Diagnosis not present

## 2022-10-18 DIAGNOSIS — N189 Chronic kidney disease, unspecified: Secondary | ICD-10-CM | POA: Diagnosis not present

## 2022-10-18 DIAGNOSIS — I2510 Atherosclerotic heart disease of native coronary artery without angina pectoris: Secondary | ICD-10-CM | POA: Diagnosis not present

## 2022-10-18 DIAGNOSIS — E785 Hyperlipidemia, unspecified: Secondary | ICD-10-CM | POA: Diagnosis not present

## 2022-10-18 DIAGNOSIS — K219 Gastro-esophageal reflux disease without esophagitis: Secondary | ICD-10-CM | POA: Diagnosis not present

## 2022-10-18 DIAGNOSIS — I509 Heart failure, unspecified: Secondary | ICD-10-CM | POA: Diagnosis not present

## 2022-10-20 ENCOUNTER — Ambulatory Visit: Payer: Medicare HMO

## 2022-10-20 NOTE — Progress Notes (Signed)
TeleHealth Visit:  This visit was completed with telemedicine (audio/video) technology. Laura Bush has verbally consented to this TeleHealth visit. The patient is located at home, the provider is located at home. The participants in this visit include the listed provider and patient. The visit was conducted today via phone call.  Unsuccessful attempt was made to connect to video. Length of call was 28 minutes.   OBESITY Laura Bush is here to discuss her progress with her obesity treatment plan along with follow-up of her obesity related diagnoses.    Today's visit was # 29  Starting weight: 196 lbs Starting date: 10/17/2019 Weight reported at last virtual office visit: no weight reported Today's reported weight:  175  lbs Total weight loss: 19 Weight change since last visit: 0   Nutrition Plan: practicing portion control and making smarter food choices, such as increasing vegetables and decreasing simple carbohydrates.    Current exercise: yoga sporadically   Interim History: Had recent PET scan that showed no cancer- she has stage 4 lung cancer.  Follows up with Dr. Marin Bush in April.  She is focusing on snacks with protein. She had added some protein (eggs) into breakfast. She uses very little oil on her salads. She is being mindful of snacking and trying to keep calories less than 300. Says she needs to drink more water. She feels she isn't losing because of reduced activity and eating richer foods.  She gets fatigued very quickly with activity. She loves going to yoga class but frequently babysits her grandchildren in [place] so she cannot always attend classes.  Eating all of the prescribed protein: yes  Skipping meals: No Drinking adequate water: No Drinking sugar sweetened beverages: No Hunger controlled: poorly controlled. Cravings controlled:  well controlled.   Assessment/Plan:  1. Insulin Resistance Last fasting insulin was 12.6. A1c was  5.3. Polyphagia:Yes Medication(s):  metformin 500 mg at lunch. Lab Results  Component Value Date   HGBA1C 5.3 01/03/2022   HGBA1C 5.2 04/06/2020   HGBA1C 5.6 04/03/2019   HGBA1C 5.6 03/28/2018   Lab Results  Component Value Date   INSULIN 12.6 10/17/2019    Plan: Continue and increase dose metformin 1000 mg daily with lunch.  Advised her she may also divide the doses and take 500 mg at breakfast and lunch. Increase protein intake. Increase fiber intake.   2. Laura Bush endorses excessive hunger throughout the day since coming off of the Ozempic.  She does not want to restart the Ozempic. Medication(s): Metformin 500 mg at lunch Effects of medication:  poorly controlled. Cravings are well controlled.   Plan: Consider topiramate in the future. Increase dose metformin to 1000 mg at lunch.   3. Generalized Obesity: Current BMI 80  Laura Bush. is currently in the action stage of change. As such, her goal is to continue with weight loss efforts.  She has agreed to practicing portion control and making smarter food choices, such as increasing vegetables and decreasing simple carbohydrates.  1.  Try to eat leaner cuts of meat. 2.  Continue to keep snacks less than 300 cal/day.  Exercise goals: Discussed finding some short exercise videos on [employer] or YouTube to do several times per week.  Behavioral modification strategies: increasing lean protein intake, meal planning , and planning for success.  Laura Bush has agreed to follow-up with our clinic in 8 weeks.   No orders of the defined types were placed in this encounter.   Medications Discontinued During This Encounter  Medication Reason   metFORMIN (  GLUCOPHAGE) 500 MG tablet Reorder     Meds ordered this encounter  Medications   metFORMIN (GLUCOPHAGE) 500 MG tablet    Sig: Take 2 tablets (1,000 mg total) by mouth daily with lunch.    Dispense:  180 tablet    Refill:  0    Order Specific Question:   Supervising  Provider    Answer:   Laura Bush [[id number]]      Objective:   VITALS: Per patient if applicable, see vitals. GENERAL: Alert and in no acute distress. CARDIOPULMONARY: No increased WOB. Speaking in clear sentences.  PSYCH: Pleasant and cooperative. Speech normal rate and rhythm. Affect is appropriate. Insight and judgement are appropriate. Attention is focused, linear, and appropriate.  NEURO: Oriented as arrived to appointment on time with no prompting.   Attestation Statements:   Reviewed by clinician on day of visit: allergies, medications, problem list, medical history, surgical history, family history, social history, and previous encounter notes.   This was prepared with the assistance of [employer].  Occasional wrong-word or sound-a-like substitutions may have occurred due to the inherent limitations of voice recognition software.

## 2022-10-24 ENCOUNTER — Encounter (INDEPENDENT_AMBULATORY_CARE_PROVIDER_SITE_OTHER): Payer: Self-pay | Admitting: Family Medicine

## 2022-10-24 ENCOUNTER — Telehealth (INDEPENDENT_AMBULATORY_CARE_PROVIDER_SITE_OTHER): Payer: Medicare HMO | Admitting: Family Medicine

## 2022-10-24 ENCOUNTER — Ambulatory Visit: Payer: Medicare HMO

## 2022-10-24 VITALS — Ht <= 58 in | Wt 175.0 lb

## 2022-10-24 DIAGNOSIS — Z6836 Body mass index (BMI) 36.0-36.9, adult: Secondary | ICD-10-CM

## 2022-10-24 DIAGNOSIS — R632 Polyphagia: Secondary | ICD-10-CM

## 2022-10-24 DIAGNOSIS — E669 Obesity, unspecified: Secondary | ICD-10-CM

## 2022-10-24 DIAGNOSIS — E88819 Insulin resistance, unspecified: Secondary | ICD-10-CM | POA: Diagnosis not present

## 2022-10-24 DIAGNOSIS — Z6839 Body mass index (BMI) 39.0-39.9, adult: Secondary | ICD-10-CM | POA: Diagnosis not present

## 2022-10-24 MED ORDER — METFORMIN HCL 500 MG PO TABS: 1000.0000 mg | ORAL_TABLET | Freq: Every day | ORAL | 0 refills | Status: DC

## 2022-10-25 ENCOUNTER — Ambulatory Visit (INDEPENDENT_AMBULATORY_CARE_PROVIDER_SITE_OTHER): Payer: Medicare HMO | Admitting: *Deleted

## 2022-10-25 VITALS — BP 128/74 | HR 67 | Ht <= 58 in | Wt 181.8 lb

## 2022-10-25 DIAGNOSIS — Z0000 Encounter for general adult medical examination without abnormal findings: Secondary | ICD-10-CM | POA: Diagnosis not present

## 2022-10-25 NOTE — Progress Notes (Signed)
Subjective:   Laura Bush is a 72 y.o. female who presents for Medicare Annual (Subsequent) preventive examination.  Review of Systems     Cardiac Risk Factors include: advanced age (>6men, >57 women);obesity (BMI >30kg/m2);dyslipidemia;hypertension     Objective:    Today's Vitals   10/25/22 0820  BP: 128/74  Pulse: 67  Weight: 181 lb 12.8 oz (82.5 kg)  Height: 4\' 10"  (1.473 m)   Body mass index is 38 kg/m.     10/25/2022    8:23 AM 10/03/2022    9:43 AM 08/22/2022    3:30 PM 07/07/2022   10:11 AM 05/18/2022    9:58 AM 04/07/2022    8:15 AM 04/06/2022    8:54 AM  Advanced Directives  Does Patient Have a Medical Advance Directive? Yes Yes Yes Yes Yes Yes Yes  Type of Paramedic of [place];Living will [place];Living will [place];Living will Living will;Healthcare Power of Attorney Living will;Healthcare Power of [place];Living will Living will;Healthcare Power of Attorney  Does patient want to make changes to medical advance directive? No - Patient declined  No - Patient declined No - Patient declined No - Patient declined    Copy of [place] in Chart? Yes - validated most recent copy scanned in chart (See row information) No - copy requested No - copy requested    Yes - validated most recent copy scanned in chart (See row information)    Current Medications (verified) Outpatient Encounter Medications as of 10/25/2022  Medication Sig   acetaminophen (TYLENOL) 500 MG tablet Take 1 tablet (500 mg total) by mouth every 8 (eight) hours as needed for mild pain or moderate pain.   alectinib (ALECENSA) 150 MG capsule Take 4 capsules (600 mg total) by mouth 2 (two) times daily with a meal.   aMILoride (MIDAMOR) 5 MG tablet TAKE ONE TABLET BY MOUTH DAILY   aspirin EC 81 MG tablet Take 81 mg by mouth daily. Swallow whole.   B Complex-C (SUPER B COMPLEX PO) Take  1 tablet by mouth daily with breakfast.   Calcium Carb-Cholecalciferol (CALCIUM + D3 PO) Take 1 tablet by mouth daily.   cholecalciferol (VITAMIN D3) 25 MCG (1000 UNIT) tablet Take 1,000 Units by mouth daily.   diphenhydrAMINE (BENADRYL) 25 mg capsule Take 25 mg by mouth at bedtime as needed. (Patient not taking: Reported on 10/03/2022)   fexofenadine (ALLEGRA) 180 MG tablet Take 180 mg by mouth in the morning.   folic acid (FOLATE) A999333 MCG tablet Take 400 mcg by mouth daily.   hydrochlorothiazide (HYDRODIURIL) 25 MG tablet Take 25 mg by mouth daily.   ipratropium (ATROVENT) 0.06 % nasal spray Place 2 sprays into both nostrils 4 (four) times daily. Use as needed (Patient not taking: Reported on 10/03/2022)   metFORMIN (GLUCOPHAGE) 500 MG tablet Take 2 tablets (1,000 mg total) by mouth daily with lunch.   NIFEdipine (PROCARDIA-XL/NIFEDICAL-XL) 30 MG 24 hr tablet TAKE 1 TABLET BY MOUTH DAILY   pantoprazole (PROTONIX) 40 MG tablet TAKE ONE TABLET BY MOUTH DAILY   polyethylene glycol (MIRALAX / GLYCOLAX) 17 g packet Take 17 g by mouth daily.   simvastatin (ZOCOR) 20 MG tablet Take 1 tablet (20 mg total) by mouth daily.   traZODone (DESYREL) 50 MG tablet TAKE ONE TABLET BY MOUTH EVERY NIGHT AT BEDTIME AS NEEDED FOR SLEEP   No facility-administered encounter medications on file as of 10/25/2022.  Allergies (verified) Diclofenac, Amlodipine, Lanolin, and Tape   History: Past Medical History:  Diagnosis Date   Allergy    seasonal   Arthritis Last several years   Mostly in hands, wrists, shoulders, knees   Asthma    in cold weather   Breast cancer ([place]) 09/18/2014   left breast   Breast cancer ([place]) 10/07/2014   bx left breast   Breast cancer of upper-outer quadrant of left female breast ([place]) 09/22/2014   Cluster headaches    in her 71's   GERD (gastroesophageal reflux disease)    Goals of care, counseling/discussion 01/14/2022   Heart murmur    as a child (has outgrown)   High  cholesterol    History of radiation therapy    Left lung, Right Femur- 02/16/22-03/04/22- Dr. 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"   Stroke [facility]) summer 2022   described as tiny   Swallowing difficulty    Venous insufficiency    Past Surgical History:  Procedure Laterality Date   ABDOMINAL HYSTERECTOMY  1999   APPENDECTOMY  ~ 2006   BONE BIOPSY Right 01/04/2022   Procedure: RIGHT FEMORAL BONE BIOPSY;  Surgeon: Altamese [place], MD;  Location: [place];  Service: Orthopedics;  Laterality: Right;   BREAST BIOPSY Left 10/2014   BREAST LUMPECTOMY Left 2016   BREAST REDUCTION SURGERY Bilateral 11/11/2014   Procedure: Bilateral Breast Reduction;  Surgeon: Crissie Reese, MD;  Location: [place];  Service: Plastics;  Laterality: Bilateral;   BRONCHIAL BIOPSY  01/06/2022   Procedure: BRONCHIAL BIOPSIES;  Surgeon: Collene Gobble, MD;  Location: [facility] ENDOSCOPY;  Service: Pulmonary;;   BRONCHIAL BRUSHINGS  01/06/2022   Procedure: BRONCHIAL BRUSHINGS;  Surgeon: Collene Gobble, MD;  Location: [facility] ENDOSCOPY;  Service: Pulmonary;;   BRONCHIAL NEEDLE ASPIRATION BIOPSY  01/06/2022   Procedure: BRONCHIAL NEEDLE ASPIRATION BIOPSIES;  Surgeon: Collene Gobble, MD;  Location: [place];  Service: Pulmonary;;   BRONCHIAL WASHINGS  01/06/2022   Procedure: BRONCHIAL WASHINGS;  Surgeon: Collene Gobble, MD;  Location: [place] ENDOSCOPY;  Service: Pulmonary;;   CARPAL TUNNEL RELEASE Bilateral    2 surgeries on right, 1 on left   CESAREAN SECTION   1978; [place] IM NAIL Right 01/04/2022   Procedure: RIGHT FEMORAL INTRAMEDULLARY (IM) NAIL;  Surgeon: Altamese Motley, MD;  Location: [place];  Service: Orthopedics;  Laterality: Right;   FIDUCIAL MARKER PLACEMENT  01/06/2022   Procedure: FIDUCIAL MARKER PLACEMENT;  Surgeon: Collene Gobble, MD;  Location: [facility] ENDOSCOPY;  Service: Pulmonary;;   FOREARM FRACTURE SURGERY Right 2003 X 3   FRACTURE SURGERY  2003, 2023   3 surgeries, right arm -03; Femur-23   KNEE ARTHROSCOPY Bilateral    meniscus repair   PARTIAL MASTECTOMY WITH NEEDLE LOCALIZATION AND AXILLARY SENTINEL LYMPH NODE BX Left 11/11/2014   Procedure: LEFT BREAST PARTIAL MASTECTOMY WITH NEEDLE LOCALIZATION TIMES TWO  AND LEFT AXILLARY SENTINEL LYMPH NODE Biopsy;  Surgeon: Fanny Skates, MD;  Location: [place];  Service: General;  Laterality: Left;   REDUCTION MAMMAPLASTY Bilateral 2016   SQUAMOUS CELL CARCINOMA EXCISION  1980's X 1   "nose"   TONSILLECTOMY  ~ Ramirez-Perez  ?1984   Family History  Problem Relation Age of Onset   Dementia Mother        Lives in [place]   Hypertension Mother    Hyperlipidemia Mother    Stroke Father        deceased   Heart failure Father    Hypertension Father    Uterine cancer Sister 55   Heart attack Maternal Grandmother    Hypertension Maternal Grandfather    Stroke Paternal Grandfather    Colon cancer Neg Hx    Esophageal cancer Neg Hx    Pancreatic cancer Neg Hx    Stomach cancer Neg Hx    Liver disease Neg Hx    Sleep apnea Neg Hx    Social History   Socioeconomic History   Marital status: Married    Spouse name: Randy Bush   Number of children: Not on file   Years of education: Not on file   Highest education level: Not on file  Occupational History   Occupation: retired  Tobacco Use   Smoking status: Never   Smokeless tobacco: Never  Vaping Use   Vaping Use: Never used  Substance and Sexual Activity   Alcohol use: Yes    Alcohol/week: 2.0  standard drinks of alcohol    Types: 2 Glasses of wine per week    Comment: 2 glasses a week   Drug use: No   Sexual activity: Not Currently  Other Topics Concern   Not on file  Social History Narrative   From [place].   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 Update 02/09/2021 retired   2 children, boy and girl   Has 4 grandchildren   Quilting, sewing, reading.   Social Determinants of Health   Financial Resource Strain: Low Risk  (10/26/2021)   Overall Financial Resource Strain (CARDIA)    Difficulty of Paying Living Expenses: Not hard at all  Food Insecurity: No Food Insecurity (10/25/2022)   Hunger Vital Sign    Worried About Running Out of Food in the Last Year: Never true    Ran Out of Food in the Last Year: Never true  Transportation Needs: No Transportation Needs (10/25/2022)   PRAPARE - Hydrologist (Medical): No    Lack of Transportation (Non-Medical): No  Physical Activity: Insufficiently Active (10/26/2021)   Exercise Vital Sign    Days of Exercise per Week: 2 days    Minutes of Exercise per Session: 60 min  Stress: Stress Concern Present (11/11/2020)   [place]    Feeling of Stress : To some extent  Social Connections: Socially Integrated (10/19/2021)   Social Connection and Isolation Panel [NHANES]    Frequency of Communication with Friends and Family: More than three times a week    Frequency of Social Gatherings with Friends and Family: Three times a week    Attends Religious Services: More than 4 times per year    Active Member of Clubs or Organizations: Yes    Attends Archivist Meetings: More than 4 times per year    Marital Status: Married  Tobacco Counseling Counseling given: Not Answered   Clinical Intake:  Pre-visit preparation completed: Yes  Pain : No/denies pain  Diabetes: No  How often do you need  to have someone help you when you read instructions, pamphlets, or other written materials from your doctor or pharmacy?: 1 - Never  Activities of Daily Living    10/25/2022    8:29 AM 01/03/2022    9:00 PM  In your present state of health, do you have any difficulty performing the following activities:  Hearing? 0 0  Vision? 0 0  Difficulty concentrating or making decisions? 0 0  Walking or climbing stairs? 1 1  Dressing or bathing? 0 1  Doing errands, shopping? 0 0  Preparing Food and eating ? N   Using the Toilet? N   In the past six months, have you accidently leaked urine? Y   Do you have problems with loss of bowel control? N   Managing your Medications? N   Managing your Finances? N   Housekeeping or managing your Housekeeping? N     Patient Care Team: Copland, Gay Filler, MD as PCP - General (Family Medicine) Skeet Latch, MD as Attending Physician (Cardiology) Cherre Robins, RPH-CPP (Pharmacist) Irene Shipper, MD as Consulting Physician (Gastroenterology) Marin Olp Rudell Cobb, MD as Medical Oncologist (Oncology) Cordelia Poche, RN as Oncology Nurse Navigator  Indicate any recent Medical Services you may have received from other than [facility] providers in the past year (date may be approximate).     Assessment:   This is a routine wellness examination for Marcelene.  Hearing/Vision screen No results found.  Dietary issues and exercise activities discussed: Current Exercise Habits: Home exercise routine, Type of exercise: yoga, Time (Minutes): 60, Frequency (Times/Week): 1, Weekly Exercise (Minutes/Week): 60, Intensity: Mild, Exercise limited by: orthopedic condition(s)   Goals Addressed   None    Depression Screen    10/25/2022    8:28 AM 12/29/2021    2:01 PM 10/19/2021    9:26 AM 04/05/2021   10:38 AM 01/21/2021    1:25 PM 09/24/2020    2:02 PM 10/17/2019   10:28 AM  PHQ 2/9 Scores  PHQ - 2 Score 0 0 0 2 0 0 2  PHQ- 9 Score       5    Fall Risk    10/25/2022     8:25 AM 12/29/2021    2:01 PM 10/19/2021    9:26 AM 04/05/2021   10:38 AM 01/21/2021    1:26 PM  Fall Risk   Falls in the past year? 1 0 0 0 0  Number falls in past yr: 1 0 0    Injury with Fall? 1 0 0    Comment fell due to femur fracture because of bone cancer      Risk for fall due to : History of fall(s)  No Fall Risks    Follow up Falls evaluation completed  Falls evaluation completed      [place]:  Any stairs in or around the home? No  Home free of loose throw rugs in walkways, pet beds, electrical cords, etc? Yes  Adequate lighting in your home to reduce risk of falls? Yes   ASSISTIVE DEVICES UTILIZED TO PREVENT FALLS:  Life alert? No  Use of a cane, walker or w/c? No  Grab bars in the bathroom? No  Shower chair or bench in shower? No  Elevated toilet seat or a handicapped toilet?  Comfort height  TIMED UP AND GO:  Was the test performed? Yes .  Length of time to ambulate 10 feet: 6 sec.   Gait steady and fast without use of assistive device  Cognitive Function:        10/25/2022    8:32 AM 10/19/2021    9:34 AM  6CIT Screen  What Year? 0 points 0 points  What month? 0 points 0 points  What time? 0 points 0 points  Count back from 20 0 points 0 points  Months in reverse 0 points 0 points  Repeat phrase 0 points 0 points  Total Score 0 points 0 points    Immunizations Immunization History  Administered Date(s) Administered   COVID-19, mRNA, vaccine(Comirnaty)12 years and older 05/01/2022   Fluad Quad(high Dose 65+) 04/03/2019, 04/29/2020, 05/07/2021, 05/13/2022   Hepatitis A 08/24/1995, 12/23/1996   Hepatitis B 12/23/1996, 02/14/1997, 05/24/1997, 06/04/1998   IPV 08/24/1995   Influenza Split 04/06/2017   Influenza, High Dose Seasonal PF 05/10/2016, 04/06/2017   Influenza,inj,Quad PF,6+ Mos 04/30/2015   Influenza-Unspecified 07/07/2005, 06/08/2006, 05/28/2007, 04/23/2008, 05/23/2011, 05/14/2012, 05/20/2013, 04/12/2018,  04/03/2019   Meningococcal Conjugate 05/07/2008   Meningococcal Mcv4o 05/07/2008   Moderna SARS-COV2 Booster Vaccination 12/22/2020   PFIZER(Purple Top)SARS-COV-2 Vaccination 09/13/2019, 10/08/2019, 05/21/2020   Pfizer Covid-19 Vaccine Bivalent Booster 13yrs & up 05/24/2021, 05/02/2022   Pneumococcal Conjugate-13 03/22/2017   Pneumococcal Polysaccharide-23 06/08/2006, 03/28/2018   Respiratory Syncytial Virus Vaccine,Recomb Aduvanted(Arexvy) 06/06/2022   Tdap 09/19/2011, 04/27/2022   Typhoid Inactivated 05/07/2008   Yellow Fever 05/08/2008   Zoster Recombinat (Shingrix) 10/06/2018, 01/18/2019   Zoster, Live 06/21/2013    TDAP status: Up to date  Flu Vaccine status: Up to date  Pneumococcal vaccine status: Up to date  Covid-19 vaccine status: Information provided on how to obtain vaccines.   Qualifies for Shingles Vaccine? Yes   Zostavax completed Yes   Shingrix Completed?: Yes  Screening Tests Health Maintenance  Topic Date Due   FOOT EXAM  Never done   OPHTHALMOLOGY EXAM  Never done   Diabetic kidney evaluation - Urine ACR  Never done   COVID-19 Vaccine (6 - 2023-24 season) 06/27/2022   HEMOGLOBIN A1C  07/06/2022   Medicare Annual Wellness (AWV)  10/20/2022   Diabetic kidney evaluation - eGFR measurement  10/04/2023   MAMMOGRAM  09/26/2024   COLONOSCOPY (Pts 45-46yrs Insurance coverage will need to be confirmed)  04/09/2025   DTaP/Tdap/Td (3 - Td or Tdap) 04/27/2032   Pneumonia Vaccine 68+ Years old  Completed   INFLUENZA VACCINE  Completed   DEXA SCAN  Completed   Hepatitis C Screening  Completed   Zoster Vaccines- Shingrix  Completed   HPV VACCINES  Aged Out   Fecal DNA (Cologuard)  Discontinued    Health Maintenance  Health Maintenance Due  Topic Date Due   FOOT EXAM  Never done   OPHTHALMOLOGY EXAM  Never done   Diabetic kidney evaluation - Urine ACR  Never done   COVID-19 Vaccine (6 - 2023-24 season) 06/27/2022   HEMOGLOBIN A1C  07/06/2022   Medicare  Annual Wellness (AWV)  10/20/2022    Colorectal cancer screening: Type of screening: Colonoscopy. Completed 04/09/20. Repeat every 5 years  Mammogram status: Completed 09/26/22. Repeat every year  Bone Density status: Completed 10/05/20. Results reflect: Bone density results: NORMAL. Repeat every 2 years.  Lung Cancer Screening: (Low Dose CT Chest recommended if Age 55-80 years, 30 pack-year currently smoking OR have quit w/in 15years.) does not qualify.   Additional Screening:  Hepatitis C Screening: does qualify; Completed 01/29/16  Vision Screening: Recommended annual ophthalmology exams for early detection of glaucoma and other disorders of the eye. Is the patient up to date with their annual eye exam?  Yes  Who is the provider or what is the name of the office in which the patient attends annual eye exams? Dr. Hardie Pulley [facility] If pt is not established with a provider, would they like to be referred to a provider to establish care? No .   Dental Screening: Recommended annual dental exams for proper oral hygiene  Community Resource Referral / Chronic Care Management: CRR required this visit?  No   CCM required this visit?  No      Plan:     I have personally reviewed and noted the following in the patient's chart:   Medical and social history Use of alcohol, tobacco or illicit drugs  Current medications and supplements including opioid prescriptions. Patient is not currently taking opioid prescriptions. Functional ability and status Nutritional status Physical activity Advanced directives List of other physicians Hospitalizations, surgeries, and ER visits in previous 12 months Vitals Screenings to include cognitive, depression, and falls Referrals and appointments  In addition, I have reviewed and discussed with patient certain preventive protocols, quality metrics, and best practice recommendations. A written personalized care plan for preventive services as well as  general preventive health recommendations were provided to patient.     Beatris Ship, [place]   10/25/2022   Nurse Notes: None

## 2022-10-25 NOTE — Patient Instructions (Signed)
Laura Bush , Thank you for taking time to come for your Medicare Wellness Visit. I appreciate your ongoing commitment to your health goals. Please review the following plan we discussed and let me know if I can assist you in the future.     This is a list of the screening recommended for you and due dates:  Health Maintenance  Topic Date Due   Complete foot exam   Never done   Eye exam for diabetics  Never done   Yearly kidney health urinalysis for diabetes  Never done   COVID-19 Vaccine (6 - 2023-24 season) 06/27/2022   Hemoglobin A1C  07/06/2022   Yearly kidney function blood test for diabetes  10/04/2023   Medicare Annual Wellness Visit  10/25/2023   Mammogram  09/26/2024   Colon Cancer Screening  04/09/2025   DTaP/Tdap/Td vaccine (3 - Td or Tdap) 04/27/2032   Pneumonia Vaccine  Completed   Flu Shot  Completed   DEXA scan (bone density measurement)  Completed   Hepatitis C Screening: USPSTF Recommendation to screen - Ages 26-79 yo.  Completed   Zoster (Shingles) Vaccine  Completed   HPV Vaccine  Aged Out   Cologuard (Stool DNA test)  Discontinued    Next appointment: Follow up in one year for your annual wellness visit.   Preventive Care 72 Years and Older, Female Preventive care refers to lifestyle choices and visits with your health care provider that can promote health and wellness. What does preventive care include? A yearly physical exam. This is also called an annual well check. Dental exams once or twice a year. Routine eye exams. Ask your health care provider how often you should have your eyes checked. Personal lifestyle choices, including: Daily care of your teeth and gums. Regular physical activity. Eating a healthy diet. Avoiding tobacco and drug use. Limiting alcohol use. Practicing safe sex. Taking low-dose aspirin every day. Taking vitamin and mineral supplements as recommended by your health care provider. What happens during an annual well check? The  services and screenings done by your health care provider during your annual well check will depend on your age, overall health, lifestyle risk factors, and family history of disease. Counseling  Your health care provider may ask you questions about your: Alcohol use. Tobacco use. Drug use. Emotional well-being. Home and relationship well-being. Sexual activity. Eating habits. History of falls. Memory and ability to understand (cognition). Work and work Statistician. Reproductive health. Screening  You may have the following tests or measurements: Height, weight, and BMI. Blood pressure. Lipid and cholesterol levels. These may be checked every 5 years, or more frequently if you are over 17 years old. Skin check. Lung cancer screening. You may have this screening every year starting at age 52 if you have a 30-pack-year history of smoking and currently smoke or have quit within the past 15 years. Fecal occult blood test (FOBT) of the stool. You may have this test every year starting at age 23. Flexible sigmoidoscopy or colonoscopy. You may have a sigmoidoscopy every 5 years or a colonoscopy every 10 years starting at age 69. Hepatitis C blood test. Hepatitis B blood test. Sexually transmitted disease (STD) testing. Diabetes screening. This is done by checking your blood sugar (glucose) after you have not eaten for a while (fasting). You may have this done every 1-3 years. Bone density scan. This is done to screen for osteoporosis. You may have this done starting at age 24. Mammogram. This may be done every 1-2  years. Talk to your health care provider about how often you should have regular mammograms. Talk with your health care provider about your test results, treatment options, and if necessary, the need for more tests. Vaccines  Your health care provider may recommend certain vaccines, such as: Influenza vaccine. This is recommended every year. Tetanus, diphtheria, and acellular  pertussis (Tdap, Td) vaccine. You may need a Td booster every 10 years. Zoster vaccine. You may need this after age 54. Pneumococcal 13-valent conjugate (PCV13) vaccine. One dose is recommended after age 63. Pneumococcal polysaccharide (PPSV23) vaccine. One dose is recommended after age 59. Talk to your health care provider about which screenings and vaccines you need and how often you need them. This information is not intended to replace advice given to you by your health care provider. Make sure you discuss any questions you have with your health care provider. Document Released: 08/21/2015 Document Revised: 04/13/2016 Document Reviewed: 05/26/2015 Elsevier Interactive Patient Education  2017 [employer] Prevention in the Home Falls can cause injuries. They can happen to people of all ages. There are many things you can do to make your home safe and to help prevent falls. What can I do on the outside of my home? Regularly fix the edges of walkways and driveways and fix any cracks. Remove anything that might make you trip as you walk through a door, such as a raised step or threshold. Trim any bushes or trees on the path to your home. Use bright outdoor lighting. Clear any walking paths of anything that might make someone trip, such as rocks or tools. Regularly check to see if handrails are loose or broken. Make sure that both sides of any steps have handrails. Any raised decks and porches should have guardrails on the edges. Have any leaves, snow, or ice cleared regularly. Use sand or salt on walking paths during winter. Clean up any spills in your garage right away. This includes oil or grease spills. What can I do in the bathroom? Use night lights. Install grab bars by the toilet and in the tub and shower. Do not use towel bars as grab bars. Use non-skid mats or decals in the tub or shower. If you need to sit down in the shower, use a plastic, non-slip stool. Keep the floor  dry. Clean up any water that spills on the floor as soon as it happens. Remove soap buildup in the tub or shower regularly. Attach bath mats securely with double-sided non-slip rug tape. Do not have throw rugs and other things on the floor that can make you trip. What can I do in the bedroom? Use night lights. Make sure that you have a light by your bed that is easy to reach. Do not use any sheets or blankets that are too big for your bed. They should not hang down onto the floor. Have a firm chair that has side arms. You can use this for support while you get dressed. Do not have throw rugs and other things on the floor that can make you trip. What can I do in the kitchen? Clean up any spills right away. Avoid walking on wet floors. Keep items that you use a lot in easy-to-reach places. If you need to reach something above you, use a strong step stool that has a grab bar. Keep electrical cords out of the way. Do not use floor polish or wax that makes floors slippery. If you must use wax, use non-skid floor  wax. Do not have throw rugs and other things on the floor that can make you trip. What can I do with my stairs? Do not leave any items on the stairs. Make sure that there are handrails on both sides of the stairs and use them. Fix handrails that are broken or loose. Make sure that handrails are as long as the stairways. Check any carpeting to make sure that it is firmly attached to the stairs. Fix any carpet that is loose or worn. Avoid having throw rugs at the top or bottom of the stairs. If you do have throw rugs, attach them to the floor with carpet tape. Make sure that you have a light switch at the top of the stairs and the bottom of the stairs. If you do not have them, ask someone to add them for you. What else can I do to help prevent falls? Wear shoes that: Do not have high heels. Have rubber bottoms. Are comfortable and fit you well. Are closed at the toe. Do not wear  sandals. If you use a stepladder: Make sure that it is fully opened. Do not climb a closed stepladder. Make sure that both sides of the stepladder are locked into place. Ask someone to hold it for you, if possible. Clearly mark and make sure that you can see: Any grab bars or handrails. First and last steps. Where the edge of each step is. Use tools that help you move around (mobility aids) if they are needed. These include: Canes. Walkers. Scooters. Crutches. Turn on the lights when you go into a dark area. Replace any light bulbs as soon as they burn out. Set up your furniture so you have a clear path. Avoid moving your furniture around. If any of your floors are uneven, fix them. If there are any pets around you, be aware of where they are. Review your medicines with your doctor. Some medicines can make you feel dizzy. This can increase your chance of falling. Ask your doctor what other things that you can do to help prevent falls. This information is not intended to replace advice given to you by your health care provider. Make sure you discuss any questions you have with your health care provider. Document Released: 05/21/2009 Document Revised: 12/31/2015 Document Reviewed: 08/29/2014 Elsevier Interactive Patient Education  2017 [employer].

## 2022-10-26 ENCOUNTER — Other Ambulatory Visit (INDEPENDENT_AMBULATORY_CARE_PROVIDER_SITE_OTHER): Payer: Medicare HMO

## 2022-10-26 ENCOUNTER — Encounter: Payer: Self-pay | Admitting: Family Medicine

## 2022-10-26 DIAGNOSIS — I10 Essential (primary) hypertension: Secondary | ICD-10-CM

## 2022-10-26 DIAGNOSIS — R7301 Impaired fasting glucose: Secondary | ICD-10-CM

## 2022-10-26 DIAGNOSIS — E782 Mixed hyperlipidemia: Secondary | ICD-10-CM | POA: Diagnosis not present

## 2022-10-26 LAB — LIPID PANEL
Cholesterol: 163 mg/dL (ref 0–200)
HDL: 63.4 mg/dL (ref >39.00)
LDL Cholesterol: 81 mg/dL (ref 0–99)
NonHDL: 99.73
Total CHOL/HDL Ratio: 3
Triglycerides: 96 mg/dL (ref 0.0–149.0)
VLDL: 19.2 mg/dL (ref 0.0–40.0)

## 2022-10-26 LAB — HEMOGLOBIN A1C: Hgb A1c MFr Bld: 5.5 % (ref 4.6–6.5)

## 2022-10-26 LAB — TSH: TSH: 2.91 u[IU]/mL (ref 0.35–5.50)

## 2022-11-03 NOTE — Progress Notes (Signed)
[facility] [address], [place], [place] [address] [phone]  Date:  11/07/2022   Name:  Laura Bush   DOB:  May 27, 1951   MRN:  [id number]  PCP:  Darreld Mclean, MD    Chief Complaint: nodule on leg (Nodule on Left lower leg x 3 weeks, pain only with pressure. /Concerns/ questions: GI issues/)   History of Present Illness:  Laura Bush is a 72 y.o. very pleasant female patient who presents with the following:  Pt seen today with concern of a bump on her leg and to discuss her medication Last seen by myself in September  From our visit in June:  hypertension, venous insufficiency, GERD, hypercholesterolemia, and morbid obesity,  breast cancer diagnosed 2016 followed by oncology, hemochromatosis being managed by hematology  Unfortunately she also recently suffered a pathologic fracture of the right femur on 01/05/22, has been diagnosed with primary lung cancer  Her hematologist is Dr. Marin Olp, her radiation oncologist is Dr. 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 3 months  -next dose in 11/2022  S/P SBRT to LUL -- completed on 03/04/2022 S/P XRT to RIGHT femur -- completed on 03/04/2022 Alecensa 600 mg po BID -- start on 03/24/2022   Lab Results  Component Value Date   HGBA1C 5.5 10/26/2022   Today pt notes a bump on her left leg - she just happened to notice it  It has been there for 3 weeks It is tender to touch only but not painfulf to walk, etc She is not sure what could have caused this  She has just noted the one spot  She had the pathologic fracture in the right leg   She has noted more frequent BM since the holidays she thinks due to her lymphocytic colitis she has  struggled with intermittently;  She was on entocort in the past- maybe 8 years go when she last used it  Since then her symptoms have been began last few months  Her grands are nearly 50, 82,7 and 77 yo- she has 4 total   Patient Active Problem List   Diagnosis Date Noted   Insulin resistance 09/13/2022   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 Griffith Citron 04/21/2020   Osteopenia 10/02/2018   Malignant 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, estrogen receptor positive 05/29/2017   Ductal carcinoma in situ (DCIS) of right breast 05/29/2017   Vitamin D deficiency 01/29/2016   Eczema 12/29/2014   Venous insufficiency 10/16/2014   Essential hypertension 10/01/2014   Hypercholesteremia 10/01/2014   GERD (gastroesophageal reflux disease) 10/01/2014   Other bilateral bundle branch block 06/23/2003  Past Medical History:  Diagnosis Date   Allergy    seasonal   Arthritis Last several years   Mostly in hands, wrists, shoulders, knees   Asthma    in cold weather   Breast cancer ([facility]) 09/18/2014   left breast   Breast cancer ([facility]) 10/07/2014   bx left breast   Breast cancer of upper-outer quadrant of left female breast ([facility]) 09/22/2014   Cluster headaches    in her 89's   GERD (gastroesophageal reflux disease)    Goals of care, counseling/discussion 01/14/2022   Heart murmur    as a child  (has outgrown)   High cholesterol    History of radiation therapy    Left lung, Right Femur- 02/16/22-03/04/22- Dr. 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"   Stroke [facility]) summer 2022   described as tiny   Swallowing difficulty    Venous insufficiency     Past Surgical History:  Procedure Laterality Date   ABDOMINAL HYSTERECTOMY  1999   APPENDECTOMY  ~ 2006   BONE BIOPSY Right 01/04/2022   Procedure: RIGHT FEMORAL BONE BIOPSY;  Surgeon: Altamese Glendon, MD;  Location: [facility];  Service: Orthopedics;  Laterality: Right;   BREAST BIOPSY Left 10/2014   BREAST LUMPECTOMY Left 2016   BREAST REDUCTION SURGERY Bilateral 11/11/2014   Procedure: Bilateral Breast Reduction;  Surgeon: Crissie Reese, MD;  Location: [facility];  Service: Plastics;  Laterality: Bilateral;   BRONCHIAL BIOPSY  01/06/2022   Procedure: BRONCHIAL BIOPSIES;  Surgeon: Collene Gobble, MD;  Location: [facility] ENDOSCOPY;  Service: Pulmonary;;   BRONCHIAL BRUSHINGS  01/06/2022   Procedure: BRONCHIAL BRUSHINGS;  Surgeon: Collene Gobble, MD;  Location: [facility] ENDOSCOPY;  Service: Pulmonary;;   BRONCHIAL NEEDLE ASPIRATION BIOPSY  01/06/2022   Procedure: BRONCHIAL NEEDLE ASPIRATION BIOPSIES;  Surgeon: Collene Gobble, MD;  Location: [facility];  Service: Pulmonary;;   BRONCHIAL WASHINGS  01/06/2022   Procedure: BRONCHIAL WASHINGS;  Surgeon: Collene Gobble, MD;  Location: [facility] ENDOSCOPY;  Service: Pulmonary;;   CARPAL TUNNEL RELEASE Bilateral    2 surgeries on right, 1 on left    CESAREAN SECTION  1978; [place] IM NAIL Right 01/04/2022   Procedure: RIGHT FEMORAL INTRAMEDULLARY (IM) NAIL;  Surgeon: Altamese , MD;  Location: [facility];  Service: Orthopedics;  Laterality: Right;   FIDUCIAL MARKER PLACEMENT  01/06/2022   Procedure: FIDUCIAL MARKER PLACEMENT;  Surgeon: Collene Gobble, MD;  Location[facility] ENDOSCOPY;  Service: Pulmonary;;   FOREARM FRACTURE SURGERY Right 2003 X 3   FRACTURE SURGERY  2003, 2023   3 surgeries, right arm -03; Femur-23   KNEE ARTHROSCOPY Bilateral    meniscus repair   PARTIAL MASTECTOMY WITH NEEDLE LOCALIZATION AND AXILLARY SENTINEL LYMPH NODE BX Left 11/11/2014   Procedure: LEFT BREAST PARTIAL MASTECTOMY WITH NEEDLE LOCALIZATION TIMES TWO AND LEFT AXILLARY SENTINEL LYMPH NODE Biopsy;  Surgeon:  Fanny Skates, MD;  Location: [facility];  Service: General;  Laterality: Left;   REDUCTION MAMMAPLASTY Bilateral 2016   SQUAMOUS CELL CARCINOMA EXCISION  1980's X 1   "nose"   TONSILLECTOMY  ~ [place]  ?1984    Social History   Tobacco Use   Smoking status: Never   Smokeless tobacco: Never  Vaping Use   Vaping Use: Never used  Substance Use Topics   Alcohol use: Yes    Alcohol/week: 2.0 standard drinks of alcohol    Types: 2 Glasses of wine per week    Comment: 2 glasses a week   Drug use: No    Family History  Problem Relation Age of Onset   Dementia Mother        Lives in [place]   Hypertension Mother    Hyperlipidemia Mother    Stroke Father        deceased   Heart failure Father    Hypertension Father    Uterine cancer Sister 74   Heart attack Maternal Grandmother    Hypertension Maternal Grandfather    Stroke Paternal Grandfather    Colon cancer Neg Hx    Esophageal cancer Neg Hx    Pancreatic cancer Neg Hx    Stomach cancer Neg Hx    Liver disease Neg Hx    Sleep apnea Neg Hx     Allergies  Allergen Reactions   Diclofenac Hypertension   Amlodipine Swelling and Other (See  Comments)    Limbs swell, not the throat   Lanolin Other (See Comments)    Sneezing and watery eyes. Allergic to [facility].   Tape Other (See Comments)    Redness (Bandaids also)    Medication list has been reviewed and updated.  Current Outpatient Medications on File Prior to Visit  Medication Sig Dispense Refill   acetaminophen (TYLENOL) 500 MG tablet Take 1 tablet (500 mg total) by mouth every 8 (eight) hours as needed for mild pain or moderate pain. 30 tablet 0   alectinib (ALECENSA) 150 MG capsule Take 4 capsules (600 mg total) by mouth 2 (two) times daily with a meal. 240 capsule 6   aMILoride (MIDAMOR) 5 MG tablet TAKE ONE TABLET BY MOUTH DAILY 90 tablet 3   aspirin EC 81 MG tablet Take 81 mg by mouth daily. Swallow whole.     B Complex-C (SUPER B COMPLEX PO) Take 1 tablet by mouth daily with breakfast.     Calcium Carb-Cholecalciferol (CALCIUM + D3 PO) Take 1 tablet by mouth daily.     cholecalciferol (VITAMIN D3) 25 MCG (1000 UNIT) tablet Take 1,000 Units by mouth daily.     diphenhydrAMINE (BENADRYL) 25 mg capsule Take 25 mg by mouth at bedtime as needed.     fexofenadine (ALLEGRA) 180 MG tablet Take 180 mg by mouth in the morning.     folic acid (FOLATE) A999333 MCG tablet Take 400 mcg by mouth daily.     hydrochlorothiazide (HYDRODIURIL) 25 MG tablet Take 25 mg by mouth daily.     ipratropium (ATROVENT) 0.06 % nasal spray Place 2 sprays into both nostrils 4 (four) times daily. Use as needed 15 mL 4   metFORMIN (GLUCOPHAGE) 500 MG tablet Take 2 tablets (1,000 mg total) by mouth daily with lunch. 180 tablet 0   NIFEdipine (PROCARDIA-XL/NIFEDICAL-XL) 30 MG 24 hr tablet TAKE 1 TABLET BY MOUTH DAILY 90 tablet 1   pantoprazole (PROTONIX) 40 MG tablet TAKE ONE TABLET BY MOUTH DAILY  90 tablet 3   polyethylene glycol (MIRALAX / GLYCOLAX) 17 g packet Take 17 g by mouth daily.     simvastatin (ZOCOR) 20 MG tablet Take 1 tablet (20 mg total) by mouth daily. 100 tablet 3   traZODone (DESYREL) 50  MG tablet TAKE ONE TABLET BY MOUTH EVERY NIGHT AT BEDTIME AS NEEDED FOR SLEEP 30 tablet 3   No current facility-administered medications on file prior to visit.    Review of Systems:  As per HPI- otherwise negative.   Physical Examination: Vitals:   11/07/22 1316  BP: 122/80  Pulse: (!) 55  Resp: 18  Temp: 97.6 F (36.4 C)  SpO2: 98%   Vitals:   11/07/22 1316  Height: 4\' 10"  (1.473 m)   Body mass index is 38 kg/m. Ideal Body Weight: Weight in (lb) to have BMI = 25: 119.4  GEN: no acute distress.  Obese, looks well  HEENT: Atraumatic, Normocephalic.  Ears and Nose: No external deformity. CV: RRR, No M/G/R. No JVD. No thrill. No extra heart sounds. PULM: CTA B, no wheezes, crackles, rhonchi. No retractions. No resp. distress. No accessory muscle use. EXTR: No c/c/e PSYCH: Normally interactive. Conversant.  Left leg:  there is a palpable approx 2cm diameter mass under the skin over her anterior mid -tibia.  Slightly tender.  Suspect a cyst of some sort   Assessment and Plan: Mass of left lower leg - Plan: DG Tibia/Fibula Left, [place] LT LOWER EXTREM LTD SOFT TISSUE NON VASCULAR  Diarrhea, unspecified type - Plan: budesonide (ENTOCORT EC) 3 MG 24 hr capsule  Patient seen today with concern of possible mass of the the left anterior shin. Given history of lung cancer with pathologic femur fracture she is course concerned.  Will obtain plain films and also an ultrasound of the area She has noted recurrence of chronic diarrhea symptoms, recently treated with budesonide.  Will have her take 6 mg daily for 2 to 4 weeks.  I will run this by her oncologist to make sure no concern with her current cancer treatment  Signed Lamar Blinks, MD

## 2022-11-07 ENCOUNTER — Encounter: Payer: Self-pay | Admitting: Family Medicine

## 2022-11-07 ENCOUNTER — Ambulatory Visit (INDEPENDENT_AMBULATORY_CARE_PROVIDER_SITE_OTHER): Payer: Medicare HMO | Admitting: Family Medicine

## 2022-11-07 ENCOUNTER — Ambulatory Visit (HOSPITAL_BASED_OUTPATIENT_CLINIC_OR_DEPARTMENT_OTHER)
Admission: RE | Admit: 2022-11-07 | Discharge: 2022-11-07 | Disposition: A | Discharge status: Home / Self Care | Payer: Medicare HMO | Source: Ambulatory Visit | Attending: Family Medicine | Admitting: Family Medicine

## 2022-11-07 VITALS — BP 122/80 | HR 55 | Temp 97.6°F | Resp 18 | Ht <= 58 in

## 2022-11-07 DIAGNOSIS — M1712 Unilateral primary osteoarthritis, left knee: Secondary | ICD-10-CM | POA: Diagnosis not present

## 2022-11-07 DIAGNOSIS — R2242 Localized swelling, mass and lump, left lower limb: Secondary | ICD-10-CM

## 2022-11-07 DIAGNOSIS — R197 Diarrhea, unspecified: Secondary | ICD-10-CM | POA: Diagnosis not present

## 2022-11-07 MED ORDER — BUDESONIDE 3 MG PO CPEP: 6.0000 mg | ORAL_CAPSULE | Freq: Every day | ORAL | 2 refills | Status: DC

## 2022-11-07 NOTE — Patient Instructions (Signed)
Good to see you today- please stop by imaging on the ground floor to have your x-ray and ultrasound I will make sure your oncology team has no concerns- assuming ok with them we can use Entocort total of 6 mg daily for 2-4 weeks for diarrhea

## 2022-11-08 ENCOUNTER — Encounter: Payer: Self-pay | Admitting: Family Medicine

## 2022-11-08 ENCOUNTER — Ambulatory Visit (HOSPITAL_BASED_OUTPATIENT_CLINIC_OR_DEPARTMENT_OTHER)
Admission: RE | Admit: 2022-11-08 | Discharge: 2022-11-08 | Disposition: A | Discharge status: Home / Self Care | Payer: Medicare HMO | Source: Ambulatory Visit | Attending: Family Medicine | Admitting: Family Medicine

## 2022-11-08 DIAGNOSIS — R2242 Localized swelling, mass and lump, left lower limb: Secondary | ICD-10-CM | POA: Insufficient documentation

## 2022-11-09 ENCOUNTER — Encounter: Payer: Self-pay | Admitting: Family Medicine

## 2022-11-14 ENCOUNTER — Encounter: Payer: Self-pay | Admitting: *Deleted

## 2022-11-14 ENCOUNTER — Inpatient Hospital Stay: Payer: Medicare HMO | Admitting: Hematology & Oncology

## 2022-11-14 ENCOUNTER — Ambulatory Visit (HOSPITAL_BASED_OUTPATIENT_CLINIC_OR_DEPARTMENT_OTHER)
Admission: RE | Admit: 2022-11-14 | Discharge: 2022-11-14 | Disposition: A | Discharge status: Home / Self Care | Payer: Medicare HMO | Source: Ambulatory Visit | Attending: Hematology & Oncology | Admitting: Hematology & Oncology

## 2022-11-14 ENCOUNTER — Other Ambulatory Visit: Payer: Self-pay

## 2022-11-14 ENCOUNTER — Inpatient Hospital Stay: Payer: Medicare HMO

## 2022-11-14 ENCOUNTER — Inpatient Hospital Stay: Payer: Medicare HMO | Attending: Hematology & Oncology

## 2022-11-14 ENCOUNTER — Encounter: Payer: Self-pay | Admitting: Hematology & Oncology

## 2022-11-14 VITALS — BP 137/62 | HR 54 | Temp 97.9°F | Resp 20 | Wt 181.0 lb

## 2022-11-14 DIAGNOSIS — C3490 Malignant neoplasm of unspecified part of unspecified bronchus or lung: Secondary | ICD-10-CM

## 2022-11-14 DIAGNOSIS — C7951 Secondary malignant neoplasm of bone: Secondary | ICD-10-CM | POA: Insufficient documentation

## 2022-11-14 DIAGNOSIS — R0781 Pleurodynia: Secondary | ICD-10-CM | POA: Diagnosis not present

## 2022-11-14 DIAGNOSIS — C3432 Malignant neoplasm of lower lobe, left bronchus or lung: Secondary | ICD-10-CM | POA: Insufficient documentation

## 2022-11-14 LAB — CMP (CANCER CENTER ONLY)
ALT: 32 U/L (ref 0–44)
AST: 30 U/L (ref 15–41)
Albumin: 4.3 g/dL (ref 3.5–5.0)
Alkaline Phosphatase: 85 U/L (ref 38–126)
Anion gap: 8 (ref 5–15)
BUN: 27 mg/dL — ABNORMAL HIGH (ref 8–23)
CO2: 31 mmol/L (ref 22–32)
Calcium: 9.6 mg/dL (ref 8.9–10.3)
Chloride: 100 mmol/L (ref 98–111)
Creatinine: 1.19 mg/dL — ABNORMAL HIGH (ref 0.44–1.00)
GFR, Estimated: 49 mL/min — ABNORMAL LOW (ref >60)
Glucose, Bld: 90 mg/dL (ref 70–99)
Potassium: 3.6 mmol/L (ref 3.5–5.1)
Sodium: 139 mmol/L (ref 135–145)
Total Bilirubin: 0.7 mg/dL (ref 0.3–1.2)
Total Protein: 7.2 g/dL (ref 6.5–8.1)

## 2022-11-14 LAB — CBC WITH DIFFERENTIAL (CANCER CENTER ONLY)
Abs Immature Granulocytes: 0.03 10*3/uL (ref 0.00–0.07)
Basophils Absolute: 0 10*3/uL (ref 0.0–0.1)
Basophils Relative: 1 %
Eosinophils Absolute: 0.1 10*3/uL (ref 0.0–0.5)
Eosinophils Relative: 2 %
HCT: 33.8 % — ABNORMAL LOW (ref 36.0–46.0)
Hemoglobin: 11.6 g/dL — ABNORMAL LOW (ref 12.0–15.0)
Immature Granulocytes: 1 %
Lymphocytes Relative: 30 %
Lymphs Abs: 1.2 10*3/uL (ref 0.7–4.0)
MCH: 34 pg (ref 26.0–34.0)
MCHC: 34.3 g/dL (ref 30.0–36.0)
MCV: 99.1 fL (ref 80.0–100.0)
Monocytes Absolute: 0.5 10*3/uL (ref 0.1–1.0)
Monocytes Relative: 12 %
Neutro Abs: 2.2 10*3/uL (ref 1.7–7.7)
Neutrophils Relative %: 54 %
Platelet Count: 205 10*3/uL (ref 150–400)
RBC: 3.41 MIL/uL — ABNORMAL LOW (ref 3.87–5.11)
RDW: 13.6 % (ref 11.5–15.5)
WBC Count: 4.1 10*3/uL (ref 4.0–10.5)
nRBC: 0 % (ref 0.0–0.2)

## 2022-11-14 LAB — CEA (ACCESS): CEA (CHCC): 15.73 ng/mL — ABNORMAL HIGH (ref 0.00–5.00)

## 2022-11-14 LAB — LACTATE DEHYDROGENASE: LDH: 238 U/L — ABNORMAL HIGH (ref 98–192)

## 2022-11-14 MED ORDER — DENOSUMAB 120 MG/1.7ML ~~LOC~~ SOLN
120.0000 mg | Freq: Once | SUBCUTANEOUS | Status: AC
  Administered 2022-11-14: 120 mg via SUBCUTANEOUS
  Filled 2022-11-14: qty 1.7

## 2022-11-14 NOTE — Progress Notes (Signed)
Hematology and Oncology Follow Up Visit  Laura Bush [id number] [name] 17, 1951 72 y.o. 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 3 months  -next dose in 02/2023  S/P SBRT to LUL -- completed on 03/04/2022 S/P XRT to RIGHT femur -- completed on 03/04/2022 Alecensa 600 mg po BID -- start on 03/24/2022     Interim History:  Laura Bush is back for follow-up.  We last saw her back in February.  Since then, she been doing fairly well.  She has had no problems with nausea or vomiting.  Her weight is slowly going up.  She does have diabetes.  She had been on Ozempic.  She is on metformin.  To be complaining of some pain in the anterior left rib cage.  This appears to be about the sixth or seventh rib.  It is under the left breast.  She has had no problems with bowels or bladder.  There is no bleeding.  There is no leg swelling.  She has had no rashes.  She has had no cough or shortness of breath.  There is no headache.  Currently, her performance status is probably ECOG 1.     Medications:  Current Outpatient Medications:    acetaminophen (TYLENOL) 500 MG tablet, Take 1 tablet (500 mg total) by mouth every 8 (eight) hours as needed for mild pain or moderate pain., Disp: 30 tablet, Rfl: 0   alectinib (ALECENSA) 150 MG capsule, Take 4 capsules (600 mg total) by mouth 2 (two) times daily with a meal., Disp: 240 capsule, Rfl: 6   aMILoride (MIDAMOR) 5 MG tablet, TAKE ONE TABLET BY MOUTH DAILY, Disp: 90 tablet, Rfl: 3   aspirin EC 81 MG tablet, Take 81 mg by mouth daily. Swallow whole., Disp: , Rfl:    B Complex-C (SUPER B COMPLEX PO), Take 1 tablet by mouth daily with breakfast., Disp: , Rfl:    budesonide (ENTOCORT EC) 3 MG 24 hr capsule, Take 2 capsules (6 mg total) by mouth daily. Take for 2-4 weeks as needed  for diarrhea, Disp: 60 capsule, Rfl: 2   Calcium Carb-Cholecalciferol (CALCIUM + D3 PO), Take 1 tablet by mouth daily., Disp: , Rfl:    cholecalciferol (VITAMIN D3) 25 MCG (1000 UNIT) tablet, Take 1,000 Units by mouth daily., Disp: , Rfl:    fexofenadine (ALLEGRA) 180 MG tablet, Take 180 mg by mouth in the morning., Disp: , Rfl:    folic acid (FOLATE) 400 MCG tablet, Take 400 mcg by mouth daily., Disp: , Rfl:    hydrochlorothiazide (HYDRODIURIL) 25 MG tablet, Take 25 mg by mouth daily., Disp: , Rfl:    metFORMIN (GLUCOPHAGE) 500 MG tablet, Take 2 tablets (1,000 mg total) by mouth daily with lunch., Disp: 180 tablet, Rfl: 0   NIFEdipine (PROCARDIA-XL/NIFEDICAL-XL) 30 MG 24 hr tablet, TAKE 1 TABLET BY MOUTH DAILY, Disp: 90 tablet, Rfl: 1   pantoprazole (PROTONIX) 40 MG tablet, TAKE ONE TABLET BY MOUTH DAILY, Disp: 90 tablet, Rfl: 3   polyethylene glycol (MIRALAX / GLYCOLAX) 17 g packet, Take 17 g by mouth daily., Disp: , Rfl:    simvastatin (ZOCOR) 20 MG tablet, Take 1 tablet (20 mg total) by mouth daily., Disp: 100 tablet, Rfl: 3   traZODone (DESYREL) 50 MG tablet, TAKE ONE TABLET BY MOUTH EVERY NIGHT AT BEDTIME  AS NEEDED FOR SLEEP, Disp: 30 tablet, Rfl: 3   diphenhydrAMINE (BENADRYL) 25 mg capsule, Take 25 mg by mouth at bedtime as needed. (Patient not taking: Reported on 11/14/2022), Disp: , Rfl:    ipratropium (ATROVENT) 0.06 % nasal spray, Place 2 sprays into both nostrils 4 (four) times daily. Use as needed (Patient not taking: Reported on 11/14/2022), Disp: 15 mL, Rfl: 4 No current facility-administered medications for this visit.  Facility-Administered Medications Ordered in Other Visits:    denosumab (XGEVA) injection 120 mg, 120 mg, Subcutaneous, Once, Traylen Eckels, Rose PhiPeter R, MD  Allergies:  Allergies  Allergen Reactions   Diclofenac Hypertension   Amlodipine Swelling and Other (See Comments)    Limbs swell, not the throat   Lanolin Other (See Comments)    Sneezing and watery eyes. Allergic  to [facility].   Tape Other (See Comments)    Redness (Bandaids also)    Past Medical History, Surgical history, Social history, and Family History were reviewed and updated.  Review of Systems: Review of Systems  Constitutional: Negative.   HENT:  Negative.    Eyes: Negative.   Respiratory: Negative.    Cardiovascular: Negative.   Gastrointestinal: Negative.   Endocrine: Negative.   Genitourinary: Negative.    Musculoskeletal:  Positive for arthralgias.  Skin: Negative.   Neurological: Negative.   Hematological: Negative.   Psychiatric/Behavioral: Negative.      Physical Exam:  weight is 181 lb 0.6 oz (82.1 kg). Her temperature is 97.9 F (36.6 C). Her blood pressure is 137/62 and her pulse is 54 (abnormal). Her respiration is 20 and oxygen saturation is 98%.   Wt Readings from Last 3 Encounters:  11/14/22 181 lb 0.6 oz (82.1 kg)  10/25/22 181 lb 12.8 oz (82.5 kg)  10/24/22 175 lb (79.4 kg)    Physical Exam Vitals reviewed.  HENT:     Head: Normocephalic and atraumatic.  Eyes:     Pupils: Pupils are equal, round, and reactive to light.  Cardiovascular:     Rate and Rhythm: Normal rate and regular rhythm.     Heart sounds: Normal heart sounds.  Pulmonary:     Effort: Pulmonary effort is normal.     Breath sounds: Normal breath sounds.  Abdominal:     General: Bowel sounds are normal.     Palpations: Abdomen is soft.  Musculoskeletal:        General: No tenderness or deformity. Normal range of motion.     Cervical back: Normal range of motion.     Comments: Extremities shows the healing surgical scar in the right thigh.  Lymphadenopathy:     Cervical: No cervical adenopathy.  Skin:    General: Skin is warm and dry.     Findings: No erythema or rash.  Neurological:     Mental Status: She is alert and oriented to person, place, and time.  Psychiatric:        Behavior: Behavior normal.        Thought Content: Thought content normal.        Judgment: Judgment  normal.     Lab Results  Component Value Date   WBC 4.1 11/14/2022   HGB 11.6 (L) 11/14/2022   HCT 33.8 (L) 11/14/2022   MCV 99.1 11/14/2022   PLT 205 11/14/2022     Chemistry      Component Value Date/Time   NA 139 11/14/2022 0909   NA 142 05/29/2017 1002   K 3.6 11/14/2022 0909   K 3.9 05/29/2017  1002   CL 100 11/14/2022 0909   CO2 31 11/14/2022 0909   CO2 28 05/29/2017 1002   BUN 27 (H) 11/14/2022 0909   BUN 13.5 05/29/2017 1002   CREATININE 1.19 (H) 11/14/2022 0909   CREATININE 0.81 04/06/2020 1059   CREATININE 0.8 05/29/2017 1002      Component Value Date/Time   CALCIUM 9.6 11/14/2022 0909   CALCIUM 9.4 05/29/2017 1002   ALKPHOS 85 11/14/2022 0909   ALKPHOS 39 (L) 05/29/2017 1002   AST 30 11/14/2022 0909   AST 18 05/29/2017 1002   ALT 32 11/14/2022 0909   ALT 19 05/29/2017 1002   BILITOT 0.7 11/14/2022 0909   BILITOT 0.48 05/29/2017 1002       Impression and Plan: Laura Bush is a very charming 72 year old white female.  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  This was actually a small lesion.  She is on the [place].  She has been on Alecensa for a good 9 months.  We will have to get an x-ray of her left rib cage.  Because she does get her [place] today.  Will have to set her up with a PET scan in May.  Hopefully, we will see that she is in remission still.  I will see her back after she has the PET scan.  Sounds like she will have a good time with her sister who is coming up from [place].     Josph Macho, MD 4/8/202410:35 AM

## 2022-11-14 NOTE — Patient Instructions (Signed)
Denosumab Injection (Oncology) What is this medication? DENOSUMAB (den oh SUE mab) prevents weakened bones caused by cancer. It may also be used to treat noncancerous bone tumors that cannot be removed by surgery. It can also be used to treat high calcium levels in the blood caused by cancer. It works by blocking a protein that causes bones to break down quickly. This slows down the release of calcium from bones, which lowers calcium levels in your blood. It also makes your bones stronger and less likely to break (fracture). This medicine may be used for other purposes; ask your health care provider or pharmacist if you have questions. COMMON BRAND NAME(S): XGEVA What should I tell my care team before I take this medication? They need to know if you have any of these conditions: Dental disease Having surgery or tooth extraction Infection Kidney disease Low levels of calcium or vitamin D in the blood Malnutrition On hemodialysis Skin conditions or sensitivity Thyroid or parathyroid disease An unusual reaction to denosumab, other medications, foods, dyes, or preservatives Pregnant or trying to get pregnant Breast-feeding How should I use this medication? This medication is for injection under the skin. It is given by your care team in a hospital or clinic setting. A special MedGuide will be given to you before each treatment. Be sure to read this information carefully each time. Talk to your care team about the use of this medication in children. While it may be prescribed for children as young as 13 years for selected conditions, precautions do apply. Overdosage: If you think you have taken too much of this medicine contact a poison control center or emergency room at once. NOTE: This medicine is only for you. Do not share this medicine with others. What if I miss a dose? Keep appointments for follow-up doses. It is important not to miss your dose. Call your care team if you are unable to  keep an appointment. What may interact with this medication? Do not take this medication with any of the following: Other medications containing denosumab This medication may also interact with the following: Medications that lower your chance of fighting infection Steroid medications, such as prednisone or cortisone This list may not describe all possible interactions. Give your health care provider a list of all the medicines, herbs, non-prescription drugs, or dietary supplements you use. Also tell them if you smoke, drink alcohol, or use illegal drugs. Some items may interact with your medicine. What should I watch for while using this medication? Your condition will be monitored carefully while you are receiving this medication. You may need blood work while taking this medication. This medication may increase your risk of getting an infection. Call your care team for advice if you get a fever, chills, sore throat, or other symptoms of a cold or flu. Do not treat yourself. Try to avoid being around people who are sick. You should make sure you get enough calcium and vitamin D while you are taking this medication, unless your care team tells you not to. Discuss the foods you eat and the vitamins you take with your care team. Some people who take this medication have severe bone, joint, or muscle pain. This medication may also increase your risk for jaw problems or a broken thigh bone. Tell your care team right away if you have severe pain in your jaw, bones, joints, or muscles. Tell your care team if you have any pain that does not go away or that gets worse. Talk   to your care team if you may be pregnant. Serious birth defects can occur if you take this medication during pregnancy and for 5 months after the last dose. You will need a negative pregnancy test before starting this medication. Contraception is recommended while taking this medication and for 5 months after the last dose. Your care team  can help you find the option that works for you. What side effects may I notice from receiving this medication? Side effects that you should report to your care team as soon as possible: Allergic reactions--skin rash, itching, hives, swelling of the face, lips, tongue, or throat Bone, joint, or muscle pain Low calcium level--muscle pain or cramps, confusion, tingling, or numbness in the hands or feet Osteonecrosis of the jaw--pain, swelling, or redness in the mouth, numbness of the jaw, poor healing after dental work, unusual discharge from the mouth, visible bones in the mouth Side effects that usually do not require medical attention (report to your care team if they continue or are bothersome): Cough Diarrhea Fatigue Headache Nausea This list may not describe all possible side effects. Call your doctor for medical advice about side effects. You may report side effects to FDA at 1-800-FDA-1088. Where should I keep my medication? This medication is given in a hospital or clinic. It will not be stored at home. NOTE: This sheet is a summary. It may not cover all possible information. If you have questions about this medicine, talk to your doctor, pharmacist, or health care provider.  2023 Elsevier/Gold Standard (2021-12-13 00:00:00)  

## 2022-11-14 NOTE — Progress Notes (Signed)
Patient continues on treatment. She will need a PET prior to her next appointment. Scheduled for 12/14/2022.  Patient sent PET appointment including date, time, and location via MyChart. The following prep is reviewed : - arrive 30 minutes before appointment time - NPO except water for 6h before scan. No candy, no gum - hold any diabetic medication the morning of the scan - have a low carb dinner the night prior  Radiology Information sheet also mailed to patient's home for reinforcement of education.  Oncology Nurse Navigator Documentation     11/14/2022    9:45 AM  Oncology Nurse Navigator Flowsheets  Navigator Follow Up Date: 12/15/2022  Navigator Follow Up Reason: Scan Review  Navigator Location [facility]  Navigator Encounter Type Appt/Treatment Plan Review;MyChart  Patient Visit Type MedOnc  Treatment Phase Active Tx  Barriers/Navigation Needs Coordination of Care;Education  Education Other  Interventions Coordination of Care;Education  Acuity Level 1-No Barriers  Coordination of Care Radiology  Education Method Written  Support Groups/Services Friends and Family  Time Spent with Patient 30

## 2022-11-16 ENCOUNTER — Encounter: Payer: Self-pay | Admitting: *Deleted

## 2022-11-21 ENCOUNTER — Encounter: Payer: Self-pay | Admitting: *Deleted

## 2022-11-27 ENCOUNTER — Other Ambulatory Visit: Payer: Self-pay | Admitting: Hematology & Oncology

## 2022-11-28 ENCOUNTER — Encounter: Payer: Self-pay | Admitting: Hematology & Oncology

## 2022-12-05 ENCOUNTER — Encounter: Payer: Self-pay | Admitting: *Deleted

## 2022-12-05 DIAGNOSIS — G4733 Obstructive sleep apnea (adult) (pediatric): Secondary | ICD-10-CM | POA: Diagnosis not present

## 2022-12-06 DIAGNOSIS — R69 Illness, unspecified: Secondary | ICD-10-CM | POA: Diagnosis not present

## 2022-12-15 ENCOUNTER — Encounter (HOSPITAL_COMMUNITY)
Admission: RE | Admit: 2022-12-15 | Discharge: 2022-12-15 | Disposition: A | Discharge status: Home / Self Care | Payer: Medicare HMO | Source: Ambulatory Visit | Attending: Hematology & Oncology | Admitting: Hematology & Oncology

## 2022-12-15 DIAGNOSIS — C3432 Malignant neoplasm of lower lobe, left bronchus or lung: Secondary | ICD-10-CM | POA: Diagnosis not present

## 2022-12-15 DIAGNOSIS — C3490 Malignant neoplasm of unspecified part of unspecified bronchus or lung: Secondary | ICD-10-CM | POA: Insufficient documentation

## 2022-12-15 DIAGNOSIS — C50912 Malignant neoplasm of unspecified site of left female breast: Secondary | ICD-10-CM | POA: Diagnosis not present

## 2022-12-15 DIAGNOSIS — C7951 Secondary malignant neoplasm of bone: Secondary | ICD-10-CM

## 2022-12-15 LAB — GLUCOSE, CAPILLARY: Glucose-Capillary: 84 mg/dL (ref 70–99)

## 2022-12-15 MED ORDER — FLUDEOXYGLUCOSE F - 18 (FDG) INJECTION
9.0000 | Freq: Once | INTRAVENOUS | Status: AC | PRN
  Administered 2022-12-15: 9 via INTRAVENOUS

## 2022-12-15 NOTE — Progress Notes (Signed)
TeleHealth Visit:  This visit was completed with telemedicine (audio/video) technology. Laura Bush has verbally consented to this TeleHealth visit. The patient is located at home, the provider is located at home. The participants in this visit include the listed provider and patient. The visit was conducted today via MyChart video.  OBESITY Laura Bush is here to discuss her progress with her obesity treatment plan along with follow-up of her obesity related diagnoses.    Today's visit was # 30  Starting weight: 196 lbs Starting date: 10/17/2019 Weight reported at last virtual office visit: 175 lbs on 10/24/22 Today's reported weight (12/19/22): high 170s Weight change since last visit: Up a few pounds  Nutrition Plan: practicing portion control and making smarter food choices, such as increasing vegetables and decreasing simple carbohydrates   Current exercise:  yoga   Interim History:  She has had significant stress recently due to recent PET scan (waiting on results). Her daughter is having a breast biopsy soon. She is trying really hard to maximize protein intake. She admits to more sweets the last few weeks due to birthdays.  She is eating plenty of fresh vegetables.  She still has low energy and is seeing cardiology soon. Her goal is weight maintenance.  Water intake is good.  Assessment/Plan:  1. Insulin Resistance Last fasting insulin was 12.6. A1c was 5.5.Marland Kitchen We increased her metformin dose to 1000 mg at lunch which she feels has helped with hunger in the afternoon. Lab Results  Component Value Date   HGBA1C 5.5 10/26/2022   HGBA1C 5.3 01/03/2022   HGBA1C 5.2 04/06/2020   HGBA1C 5.6 04/03/2019   HGBA1C 5.6 03/28/2018   Lab Results  Component Value Date   INSULIN 12.6 10/17/2019    Plan: Continue and refill and Continue and increase dose metformin 500 mg - 2 tablets at  2.  Other fatigue Has had longstanding fatigue since last summer when she was diagnosed with  metastatic lung cancer.  This prohibits cardio exercise.  She has attributed this to her cancer treatment but is having a follow-up with cardiology soon to rule out cardiac etiology.  Plan: Follow-up with both cardiology and oncology as recommended.  3. Generalized Obesity: Current BMI 37 Laura Bush is currently in the action stage of change. As such, her goal is to maintain weight for now.  She has agreed to practicing portion control and making smarter food choices, such as increasing vegetables and decreasing simple carbohydrates. Encouraged 80 g of protein daily.  Exercise goals: Yoga as tolerated.  Behavioral modification strategies: increasing lean protein intake and decreasing simple carbohydrates .  Laura Bush has agreed to follow-up with our clinic in 8 weeks.   No orders of the defined types were placed in this encounter.   Medications Discontinued During This Encounter  Medication Reason   metFORMIN (GLUCOPHAGE) 500 MG tablet Reorder     Meds ordered this encounter  Medications   metFORMIN (GLUCOPHAGE) 500 MG tablet    Sig: Take 2 pills (1000 mg) at lunch and 1 pill (500 mg) at supper.    Dispense:  270 tablet    Refill:  0    Order Specific Question:   Supervising Provider    Answer:   Glennis Brink [[id number]]      Objective:   VITALS: Per patient if applicable, see vitals. GENERAL: Alert and in no acute distress. CARDIOPULMONARY: No increased WOB. Speaking in clear sentences.  PSYCH: Pleasant and cooperative. Speech normal rate and rhythm. Affect is appropriate. Insight and judgement  are appropriate. Attention is focused, linear, and appropriate.  NEURO: Oriented as arrived to appointment on time with no prompting.   Attestation Statements:   Reviewed by clinician on day of visit: allergies, medications, problem list, medical history, surgical history, family history, social history, and previous encounter notes.   This was prepared with the assistance of Dragon  Medical.  Occasional wrong-word or sound-a-like substitutions may have occurred due to the inherent limitations of voice recognition software.

## 2022-12-19 ENCOUNTER — Encounter (INDEPENDENT_AMBULATORY_CARE_PROVIDER_SITE_OTHER): Payer: Self-pay | Admitting: Family Medicine

## 2022-12-19 ENCOUNTER — Other Ambulatory Visit: Payer: Self-pay | Admitting: Family Medicine

## 2022-12-19 ENCOUNTER — Telehealth: Payer: Self-pay

## 2022-12-19 ENCOUNTER — Telehealth (INDEPENDENT_AMBULATORY_CARE_PROVIDER_SITE_OTHER): Payer: Medicare HMO | Admitting: Family Medicine

## 2022-12-19 DIAGNOSIS — R5383 Other fatigue: Secondary | ICD-10-CM

## 2022-12-19 DIAGNOSIS — E669 Obesity, unspecified: Secondary | ICD-10-CM | POA: Diagnosis not present

## 2022-12-19 DIAGNOSIS — Z6837 Body mass index (BMI) 37.0-37.9, adult: Secondary | ICD-10-CM | POA: Diagnosis not present

## 2022-12-19 DIAGNOSIS — E88819 Insulin resistance, unspecified: Secondary | ICD-10-CM | POA: Diagnosis not present

## 2022-12-19 MED ORDER — METFORMIN HCL 500 MG PO TABS
ORAL_TABLET | ORAL | 0 refills | Status: DC
Start: 2022-12-19 — End: 2023-01-19

## 2022-12-19 NOTE — Telephone Encounter (Signed)
Advised via MyChart.

## 2022-12-19 NOTE — Telephone Encounter (Signed)
-----   Message from Josph Macho, MD sent at 12/19/2022  1:19 PM EDT ----- Call and let her know that the PET scan looks fantastic.  There is no obvious metabolically active cancer.  You are doing a great job.  Thanks.  Cindee Lame

## 2022-12-20 ENCOUNTER — Encounter: Payer: Self-pay | Admitting: *Deleted

## 2022-12-20 NOTE — Progress Notes (Signed)
Reviewed PET which is negative for any new findings.   Oncology Nurse Navigator Documentation     12/20/2022    7:30 AM  Oncology Nurse Navigator Flowsheets  Navigator Follow Up Date: 12/29/2022  Navigator Follow Up Reason: Follow-up Appointment  Navigator Location [facility]  Navigator Encounter Type Scan Review  Patient Visit Type MedOnc  Treatment Phase Active Tx  Barriers/Navigation Needs No Barriers At This Time  Interventions None Required  Acuity Level 1-No Barriers  Support Groups/Services Friends and Family  Time Spent with Patient 15

## 2022-12-20 NOTE — Progress Notes (Signed)
Cardiology Office Note   Date:  12/21/2022   ID:  Laura Bush, DOB 1951-05-14, MRN [id number]  PCP:  Laura Cables, MD  Cardiologist:  Laura Si, MD  Electrophysiologist:  None   Evaluation Performed:  Follow-Up Visit  Chief Complaint:  Weight gain  History of Present Illness:    Laura Bush is a 72 y.o. female breast cancer s/p lumpectomy and XRT, GERD, hypertension and hyperlipidemia who presents for follow up.  She was seen 04/2016 with a report of progressive exertional dyspnea. It was especially prominent when walking up an incline. She was referred for an exercise Myoview that was negative for ischemia and revealed LVEF 71%.  She also noted that her heart rate only gets into the 90s with exertion.  Metoprolol was switched to carvedilol. She followed up with our pharmacist in her blood pressure was well-controlled. However amlodipine was discontinued due to lower extremity edema.   HCTZ was increased due to poorly controlled blood pressure.   [name] switched to a plant-based diet in September 2020.    She was frustrated by her lack of weight loss and was referred to the healthy weight and wellness clinic where she has already lost over 25 pounds. She was congratulated on her weight loss efforts. At the time of her 11/2020 visit she felt her weight loss had decreased in the setting of an URI. Her recovery was slow and she complained of fatigue. Her blood pressure was well controlled, and her DOE was improving with her weight loss and exercise.  Today, she reports that last Summer she was diagnosed with stage 4 lung cancer, now in remission. Initially she had radiation therapy. Currently she is on daily chemotherapy with Laura Bush. She had a repeat PET scan last week. Next Thursday she follows up with Laura Bush and will have labs drawn then. She complains of left-sided chest pain, sometimes as a discomfort and soreness with palpation, and sometimes more of a  sharp quality. She hasn't noticed any changes to her left-sided pain with or without activity. She also reported this to her oncologist who ordered an x-ray that was unremarkable. In clinic today her blood pressure is 136/72, and 132/60 on manual recheck. Usually she has readings closer to 128/72 at home. Previously she was walking up to 2 miles a day. She admits to very little formal exercise since her last visit, as she has had multiple health issues including the lung cancer and a fractured femur. Following the titanium rod placement she had struggled with lymphedema. Her weight surprised her this morning, as she states she seems to have gained 7 pounds. This morning her weight was 183 at home, and now 188 in clinic. Over the last month her weight has increased without significant changes in diet or lifestyle. She does believe she is retaining fluid. Usually she wears compression stockings at home. Of note, she has stopped her Ozempic as her injection sites seemed to be bruising significantly worse with the addition of chemotherapy. She denies any palpitations, shortness of breath, lightheadedness, headaches, syncope, orthopnea, or PND.   Past Medical History:  Diagnosis Date   Allergy    seasonal   Arthritis Last several years   Mostly in hands, wrists, shoulders, knees   Asthma    in cold weather   Breast cancer (HCC) 09/18/2014   left breast   Breast cancer (HCC) 10/07/2014   bx left breast   Breast cancer of upper-outer quadrant of left female breast (  HCC) 09/22/2014   Cluster headaches    in her 40's   GERD (gastroesophageal reflux disease)    Goals of care, counseling/discussion 01/14/2022   Heart murmur    as a child (has outgrown)   High cholesterol    History of radiation therapy    Left lung, Right Femur- 02/16/22-03/04/22- Dr. 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"   Stroke [facility]) summer 2022   described as tiny   Swallowing difficulty    Venous insufficiency    Past Surgical History:  Procedure Laterality Date   ABDOMINAL HYSTERECTOMY  1999   APPENDECTOMY  ~ 2006   BONE BIOPSY Right 01/04/2022   Procedure: RIGHT FEMORAL BONE BIOPSY;  Surgeon: Myrene Galas, MD;  Location: [facility] OR;  Service: Orthopedics;  Laterality: Right;   BREAST BIOPSY Left 10/2014   BREAST LUMPECTOMY Left 2016   BREAST REDUCTION SURGERY Bilateral 11/11/2014   Procedure: Bilateral Breast Reduction;  Surgeon: Etter Sjogren, MD;  Location: [facility] OR;  Service: Plastics;  Laterality: Bilateral;   BRONCHIAL BIOPSY  01/06/2022   Procedure: BRONCHIAL BIOPSIES;  Surgeon: Leslye Peer, MD;  Location: [facility] ENDOSCOPY;  Service: Pulmonary;;   BRONCHIAL BRUSHINGS  01/06/2022   Procedure: BRONCHIAL BRUSHINGS;  Surgeon: Leslye Peer, MD;  Location: [facility] ENDOSCOPY;  Service: Pulmonary;;   BRONCHIAL NEEDLE ASPIRATION BIOPSY  01/06/2022   Procedure: BRONCHIAL NEEDLE ASPIRATION BIOPSIES;  Surgeon: Leslye Peer, MD;  Location: [facility] ENDOSCOPY;  Service: Pulmonary;;   BRONCHIAL WASHINGS  01/06/2022   Procedure: BRONCHIAL WASHINGS;  Surgeon: Leslye Peer, MD;  Location: [facility] ENDOSCOPY;  Service: Pulmonary;;   CARPAL TUNNEL RELEASE Bilateral    2 surgeries on right, 1 on left   CESAREAN SECTION  1978; 1980   COLONOSCOPY     FEMUR IM NAIL Right 01/04/2022   Procedure: RIGHT FEMORAL INTRAMEDULLARY (IM) NAIL;  Surgeon: Myrene Galas, MD;  Location: [facility] OR;  Service: Orthopedics;  Laterality: Right;   FIDUCIAL MARKER PLACEMENT   01/06/2022   Procedure: FIDUCIAL MARKER PLACEMENT;  Surgeon: Leslye Peer, MD;  Location: [facility] ENDOSCOPY;  Service: Pulmonary;;   FOREARM FRACTURE SURGERY Right 2003 X 3   FRACTURE SURGERY  2003, 2023   3 surgeries, right arm -03; Femur-23   KNEE ARTHROSCOPY Bilateral    meniscus repair   PARTIAL MASTECTOMY WITH NEEDLE LOCALIZATION AND AXILLARY SENTINEL LYMPH NODE BX Left 11/11/2014   Procedure: LEFT BREAST PARTIAL MASTECTOMY WITH NEEDLE LOCALIZATION TIMES TWO AND LEFT AXILLARY SENTINEL LYMPH NODE Biopsy;  Surgeon: Claud Kelp, MD;  Location: [facility] OR;  Service: General;  Laterality: Left;   REDUCTION MAMMAPLASTY Bilateral 2016   SQUAMOUS CELL CARCINOMA EXCISION  1980's X 1   "nose"   TONSILLECTOMY  ~ 1957   TUBAL LIGATION  ?1984     Current Meds  Medication Sig   acetaminophen (TYLENOL) 500 MG tablet Take 1 tablet (500 mg total) by mouth every  8 (eight) hours as needed for mild pain or moderate pain.   alectinib (Laura Bush) 150 MG capsule Take 4 capsules (600 mg total) by mouth 2 (two) times daily with a meal.   aMILoride (MIDAMOR) 5 MG tablet TAKE ONE TABLET BY MOUTH DAILY   aspirin EC 81 MG tablet Take 81 mg by mouth daily. Swallow whole.   B Complex-C (SUPER B COMPLEX PO) Take 1 tablet by mouth daily with breakfast.   Calcium Carb-Cholecalciferol (CALCIUM + D3 PO) Take 1 tablet by mouth daily.   cholecalciferol (VITAMIN D3) 25 MCG (1000 UNIT) tablet Take 1,000 Units by mouth daily.   diphenhydrAMINE (BENADRYL) 25 mg capsule Take 25 mg by mouth at bedtime as needed.   fexofenadine (ALLEGRA) 180 MG tablet Take 180 mg by mouth in the morning.   folic acid (FOLATE) 400 MCG tablet Take 400 mcg by mouth daily.   hydrochlorothiazide (HYDRODIURIL) 25 MG tablet Take 1 tablet (25 mg total) by mouth daily.   ipratropium (ATROVENT) 0.06 % nasal spray Place 2 sprays into both nostrils 4 (four) times daily. Use as needed   metFORMIN (GLUCOPHAGE) 500 MG tablet Take 2 pills (1000 mg) at lunch and 1  pill (500 mg) at supper.   pantoprazole (PROTONIX) 40 MG tablet TAKE ONE TABLET BY MOUTH DAILY   polyethylene glycol (MIRALAX / GLYCOLAX) 17 g packet Take 17 g by mouth daily.   rosuvastatin (CRESTOR) 20 MG tablet Take 1 tablet (20 mg total) by mouth daily.   traZODone (DESYREL) 50 MG tablet TAKE ONE TABLET BY MOUTH EVERY NIGHT AT BEDTIME AS NEEDED FOR SLEEP   valsartan (DIOVAN) 80 MG tablet Take 1 tablet (80 mg total) by mouth daily.   [DISCONTINUED] NIFEdipine (PROCARDIA-XL/NIFEDICAL-XL) 30 MG 24 hr tablet TAKE 1 TABLET BY MOUTH DAILY   [DISCONTINUED] simvastatin (ZOCOR) 20 MG tablet Take 1 tablet (20 mg total) by mouth daily.     Allergies:   Diclofenac, Amlodipine, Lanolin, and Tape   Social History   Tobacco Use   Smoking status: Never   Smokeless tobacco: Never  Vaping Use   Vaping Use: Never used  Substance Use Topics   Alcohol use: Yes    Alcohol/week: 2.0 standard drinks of alcohol    Types: 2 Glasses of wine per week    Comment: 2 glasses a week   Drug use: No     Family Hx: The patient's family history includes Dementia in her mother; Heart attack in her maternal grandmother; Heart failure in her father; Hyperlipidemia in her mother; Hypertension in her father, maternal grandfather, and mother; Stroke in her father and paternal grandfather; Uterine cancer (age of onset: 66) in her sister. There is no history of Colon cancer, Esophageal cancer, Pancreatic cancer, Stomach cancer, Liver disease, or Sleep apnea.  ROS:   Please see the history of present illness.    (+) Left-sided chest pain (+) BLE edema All other systems reviewed and are negative.   Prior CV studies:   The following studies were reviewed today:  Echo  03/03/2021:  1. Left ventricular ejection fraction, by estimation, is 60 to 65%. The  left ventricle has normal function. The left ventricle has no regional  wall motion abnormalities. There is mild concentric left ventricular  hypertrophy. Left  ventricular diastolic  parameters are consistent with Grade I diastolic dysfunction (impaired  relaxation).   2. Right ventricular systolic function is normal. The right ventricular  size is normal. Tricuspid regurgitation signal is inadequate for assessing  PA  pressure.   3. The mitral valve is normal in structure. No evidence of mitral valve  regurgitation. No evidence of mitral stenosis.   4. The aortic valve is tricuspid. There is mild calcification of the  aortic valve. There is mild thickening of the aortic valve. Aortic valve  regurgitation is not visualized. Mild aortic valve sclerosis is present,  with no evidence of aortic valve  stenosis.   5. The inferior vena cava is normal in size with greater than 50%  respiratory variability, suggesting right atrial pressure of 3 mmHg.   Comparison(s): No prior Echocardiogram.   Conclusion(s)/Recommendation(s): No intracardiac source of embolism  detected on this transthoracic study. A transesophageal echocardiogram is  recommended to exclude cardiac source of embolism if clinically indicated.   Bilateral Carotid Doppler  03/03/2021: Summary:  Right Carotid: Velocities in the right ICA are consistent with a 1-39%  stenosis.   Left Carotid: Velocities in the left ICA are consistent with a 1-39%  stenosis.   Vertebrals: Bilateral vertebral arteries demonstrate antegrade flow.   Exercise Myoview 05/11/16: The left ventricular ejection fraction is hyperdynamic (>65%). Nuclear stress EF: 71%. Blood pressure demonstrated a normal response to exercise. There was no ST segment deviation noted during stress. The study is normal.   Normal stress nuclear study with no ischemia or infarction; EF 71 with normal wall motion.   Labs/Other Tests and Data Reviewed:    EKG:  EKG is personally reviewed. 12/21/2022:  Sinus bradycardia. Rate 55 bpm. LAFB. 11/11/2020:  Sinus rhythm. Rate 63 bpm. LAFB.  Recent Labs: 01/03/2022: Magnesium  1.9 10/26/2022: TSH 2.91 11/14/2022: ALT 32; BUN 27; Creatinine 1.19; Hemoglobin 11.6; Platelet Count 205; Potassium 3.6; Sodium 139   Recent Lipid Panel Lab Results  Component Value Date/Time   CHOL 163 10/26/2022 07:42 AM   TRIG 96.0 10/26/2022 07:42 AM   HDL 63.40 10/26/2022 07:42 AM   CHOLHDL 3 10/26/2022 07:42 AM   LDLCALC 81 10/26/2022 07:42 AM   LDLCALC 70 04/06/2020 10:59 AM    Wt Readings from Last 3 Encounters:  12/21/22 188 lb 11.2 oz (85.6 kg)  11/14/22 181 lb 0.6 oz (82.1 kg)  10/25/22 181 lb 12.8 oz (82.5 kg)     Objective:    VS:  BP 132/60 (BP Location: Right Arm, Patient Position: Sitting, Cuff Size: Large)   Pulse (!) 55   Ht 4\' 10"  (1.473 m)   Wt 188 lb 11.2 oz (85.6 kg)   BMI 39.44 kg/m  , BMI Body mass index is 39.44 kg/m. GENERAL:  Well appearing HEENT: Pupils equal round and reactive, fundi not visualized, oral mucosa unremarkable NECK:  No jugular venous distention, waveform within normal limits, carotid upstroke brisk and symmetric, no bruits LUNGS:  Clear to auscultation bilaterally HEART:  RRR.  PMI not displaced or sustained,S1 and S2 within normal limits, no S3, no S4, no clicks, no rubs, no murmurs ABD:  Flat, positive bowel sounds normal in frequency in pitch, no bruits, no rebound, no guarding, no midline pulsatile mass, no hepatomegaly, no splenomegaly EXT:  2 plus pulses throughout, 2+ non-pitting LE edema bilaterally, no cyanosis no clubbing SKIN:  No rashes no nodules NEURO:  Cranial nerves II through XII grossly intact, motor grossly intact throughout PSYCH:  Cognitively intact, oriented to person place and time  ASSESSMENT & PLAN:    CAD (coronary artery disease) CAD noted on CT and PET imaging.  She has atypical chest pain that seems musculoskeletal.  She isn't getting much exercise so  it is challenging to know if there is any exertional component.  We will get a coronary CT-a 2 assess the severity of her CAD.  Continue aspirin.  She is  bradycardic so no need for beta-blocker.  Switch simvastatin to rosuvastatin.  LDL goal is less than 70.  Repeat fasting lipids and a CMP in 2 to 3 months.  Essential hypertension Blood pressure slightly above goal in the office.  It has been well-controlled at home.  She does have some lymphedema.  Is possible that nifedipine is contributing.  Will stop the nifedipine and start valsartan 80 mg daily.  Check BMP in 1 week.  She will check her blood pressures at home and bring to follow-up.  Continue HCTZ.  Hypercholesteremia Switch simvastatin to rosuvastatin as above.  Lower extremity edema On exam this seems to be consistent with lymphedema than edema.  Check BNP.  Will give her Lasix 20 mg daily for 3 days to see if this helps.  Encouraged her to continue using compression socks and elevate her legs when sitting.  Stop nifedipine as it may be contributing.  She notes that she does use it for Raynaud's and we may not have to started back in the winter.  Of note, her current chemotherapy seems to also have a possibility of causing edema.  She has not been on any cardiotoxic chemotherapy recently.  Will check an echocardiogram.  Disposition: FU with APP in 6 weeks. FU with Sura Canul C. Duke Salvia, MD, [facility] in 6 months.  Medication Adjustments/Labs and Tests Ordered: Current medicines are reviewed at length with the patient today.  Concerns regarding medicines are outlined above.   Orders Placed This Encounter  Procedures   CT CORONARY MORPH W/CTA COR W/SCORE W/CA W/CM &/OR WO/CM   Basic metabolic panel   B Nat Peptide   Lipid panel   Comprehensive metabolic panel   EKG 12-Lead   ECHOCARDIOGRAM COMPLETE   Meds ordered this encounter  Medications   valsartan (DIOVAN) 80 MG tablet    Sig: Take 1 tablet (80 mg total) by mouth daily.    Dispense:  90 tablet    Refill:  3    D/C NIFEDIPINE   rosuvastatin (CRESTOR) 20 MG tablet    Sig: Take 1 tablet (20 mg total) by mouth daily.     Dispense:  90 tablet    Refill:  3    D/C SIMVASTATIN   Patient Instructions  Medication Instructions:  STOP NIFEDIPINE   START VALSARTAN 80 MG DAILY   STOP SIMVASTATIN   START ROSUVASTATIN 20 MG DAILY   *If you need a refill on your cardiac medications before your next appointment, please call your pharmacy*   Lab Work: BMET/BNP IN 1 WEEK   FASTING LP/CMET IN 2-3 MONTHS   If you have labs (blood work) drawn today and your tests are completely normal, you will receive your results only by: MyChart Message (if you have MyChart) OR A paper copy in the mail If you have any lab test that is abnormal or we need to change your treatment, we will call you to review the results.   Testing/Procedures: Your physician has requested that you have an echocardiogram. Echocardiography is a painless test that uses sound waves to create images of your heart. It provides your doctor with information about the size and shape of your heart and how well your heart's chambers and valves are working. This procedure takes approximately one hour. There are no restrictions for this procedure.  Please do NOT wear cologne, perfume, aftershave, or lotions (deodorant is allowed). Please arrive 15 minutes prior to your appointment time.   Your physician has requested that you have cardiac CT. Cardiac computed tomography (CT) is a painless test that uses an x-ray machine to take clear, detailed pictures of your heart. For further information please visit [email or web address] Please follow instruction sheet as given. ONCE INSURANCE HAS BEEN REVIEWED THE OFFICE WILL CALL YOU TO SCHEDULE. IF YOU DO NOT HEAR IN 2 WEEKS CALL TO FOLLOW UP   Follow-Up: At [facility], you and your health needs are our priority.  As part of our continuing mission to provide you with exceptional heart care, we have created designated Provider Care Teams.  These Care Teams include your primary Cardiologist (physician) and  Advanced Practice Providers (APPs -  Physician Assistants and Nurse Practitioners) who all work together to provide you with the care you need, when you need it.  We recommend signing up for the patient portal called "MyChart".  Sign up information is provided on this After Visit Summary.  MyChart is used to connect with patients for Virtual Visits (Telemedicine).  Patients are able to view lab/test results, encounter notes, upcoming appointments, etc.  Non-urgent messages can be sent to your provider as well.   To learn more about what you can do with MyChart, go to [email or web address].    Your next appointment:   6 week(s)  Provider:   Gillian Shields, NP    6 MONTHS WITH [facility]  Other Instructions    Your cardiac CT will be scheduled at one of the below locations:   [facility] [address] [place], [place] [address] [phone]  OR  [facility] [address] [place], [place] [address] [phone]  OR   [facility] [address] [place], [place] [address] [phone]  If scheduled at [facility], please arrive at the [facility] En[facility] (Entrance C2) of [facility] 30 minutes prior to test start time. You can use the FREE valet parking offered at entrance C (encouraged to control the heart rate for the test)  Proceed to the [facility] Radiology Department (first floor) to check-in and test prep.  All radiology patients and guests should use entrance C2 at [facility], accessed from [facility], even though the hospital's physical address listed is [address].    If scheduled at [facility] or [facility], please arrive 15 mins early for check-in and test prep.   Please follow these instructions carefully (unless otherwise directed):  Hold all erectile  dysfunction medications at least 3 days (72 hrs) prior to test. (Ie viagra, cialis, sildenafil, tadalafil, etc) We will administer nitroglycerin during this exam.   On the Night Before the Test: Be sure to Drink plenty of water. Do not consume any caffeinated/decaffeinated beverages or chocolate 12 hours prior to your test. Do not take any antihistamines 12 hours prior to your test  On the Day of the Test: Drink plenty of water until 1 hour prior to the test. Do not eat any food 1 hour prior to test. You may take your regular medications prior to the test.  Take metoprolol (Lopressor) two hours prior to test. If you take Furosemide/Hydrochlorothiazide/Spironolactone, please HOLD on the morning of the test. FEMALES- please wear underwire-free bra if available, avoid dresses & tight clothing  After the Test: Drink plenty of water. After receiving IV contrast, you may experience a mild flushed feeling. This is normal. On occasion, you may experience a mild rash up to 24 hours after the test. This is not dangerous. If this occurs, you can take Benadryl 25 mg and increase your fluid intake. If you experience trouble breathing, this can be serious. If it is severe call 911 IMMEDIATELY. If it is mild, please call our office. If you take any of these medications: Glipizide/Metformin, Avandament, Glucavance, please do not take 48 hours after completing test unless otherwise instructed.  We will call to schedule your test 2-4 weeks out understanding that some insurance companies will need an authorization prior to the service being performed.   For non-scheduling related questions, please contact the cardiac imaging nurse navigator should you have any questions/concerns: Rockwell [place], Cardiac Imaging Nurse Navigator Larey Brick, Cardiac Imaging Nurse Navigator [facility] Direct Office Dial: [phone]   For scheduling needs, including cancellations and  rescheduling, please call [place], [phone].      I,Mathew Stumpf,acting as a Neurosurgeon for Laura Si, MD.,have documented all relevant documentation on the behalf of Laura Si, MD,as directed by  Laura Si, MD while in the presence of Laura Si, MD.  I, Don Tiu C. Duke Salvia, MD have reviewed all documentation for this visit.  The documentation of the exam, diagnosis, procedures, and orders on 12/21/2022 are all accurate and complete.   Signed, Laura Si, MD  12/21/2022 10:28 AM    [facility]

## 2022-12-21 ENCOUNTER — Encounter (HOSPITAL_BASED_OUTPATIENT_CLINIC_OR_DEPARTMENT_OTHER): Payer: Self-pay | Admitting: Cardiovascular Disease

## 2022-12-21 ENCOUNTER — Encounter (HOSPITAL_BASED_OUTPATIENT_CLINIC_OR_DEPARTMENT_OTHER): Payer: Self-pay | Admitting: *Deleted

## 2022-12-21 ENCOUNTER — Ambulatory Visit (HOSPITAL_BASED_OUTPATIENT_CLINIC_OR_DEPARTMENT_OTHER): Payer: Medicare HMO | Admitting: Cardiovascular Disease

## 2022-12-21 VITALS — BP 132/60 | HR 55 | Ht <= 58 in | Wt 188.7 lb

## 2022-12-21 DIAGNOSIS — Z5181 Encounter for therapeutic drug level monitoring: Secondary | ICD-10-CM

## 2022-12-21 DIAGNOSIS — E785 Hyperlipidemia, unspecified: Secondary | ICD-10-CM | POA: Diagnosis not present

## 2022-12-21 DIAGNOSIS — R079 Chest pain, unspecified: Secondary | ICD-10-CM

## 2022-12-21 DIAGNOSIS — I2510 Atherosclerotic heart disease of native coronary artery without angina pectoris: Secondary | ICD-10-CM | POA: Diagnosis not present

## 2022-12-21 DIAGNOSIS — R609 Edema, unspecified: Secondary | ICD-10-CM

## 2022-12-21 DIAGNOSIS — I10 Essential (primary) hypertension: Secondary | ICD-10-CM

## 2022-12-21 DIAGNOSIS — E7800 Pure hypercholesterolemia, unspecified: Secondary | ICD-10-CM | POA: Diagnosis not present

## 2022-12-21 DIAGNOSIS — I2583 Coronary atherosclerosis due to lipid rich plaque: Secondary | ICD-10-CM

## 2022-12-21 DIAGNOSIS — R600 Localized edema: Secondary | ICD-10-CM | POA: Diagnosis not present

## 2022-12-21 MED ORDER — ROSUVASTATIN CALCIUM 20 MG PO TABS: 20.0000 mg | ORAL_TABLET | Freq: Every day | ORAL | 3 refills | Status: DC

## 2022-12-21 MED ORDER — FUROSEMIDE 20 MG PO TABS: ORAL_TABLET | ORAL | 0 refills | Status: DC

## 2022-12-21 MED ORDER — VALSARTAN 80 MG PO TABS: 80.0000 mg | ORAL_TABLET | Freq: Every day | ORAL | 3 refills | Status: DC

## 2022-12-21 NOTE — Addendum Note (Signed)
Addended by: Regis Bill B on: 12/21/2022 10:33 AM   Modules accepted: Orders

## 2022-12-21 NOTE — Assessment & Plan Note (Signed)
CAD noted on CT and PET imaging.  She has atypical chest pain that seems musculoskeletal.  She isn't getting much exercise so it is challenging to know if there is any exertional component.  We will get a coronary CT-a 2 assess the severity of her CAD.  Continue aspirin.  She is bradycardic so no need for beta-blocker.  Switch simvastatin to rosuvastatin.  LDL goal is less than 70.  Repeat fasting lipids and a CMP in 2 to 3 months.

## 2022-12-21 NOTE — Assessment & Plan Note (Addendum)
On exam this seems to be consistent with lymphedema than edema.  Check BNP.  Will give her Lasix 20 mg daily for 3 days to see if this helps.  Encouraged her to continue using compression socks and elevate her legs when sitting.  Stop nifedipine as it may be contributing.  She notes that she does use it for Raynaud's and we may not have to started back in the winter.  Of note, her current chemotherapy seems to also have a possibility of causing edema.  She has not been on any cardiotoxic chemotherapy recently.  Will check an echocardiogram.

## 2022-12-21 NOTE — Assessment & Plan Note (Signed)
Switch simvastatin to rosuvastatin as above.

## 2022-12-21 NOTE — Assessment & Plan Note (Signed)
Blood pressure slightly above goal in the office.  It has been well-controlled at home.  She does have some lymphedema.  Is possible that nifedipine is contributing.  Will stop the nifedipine and start valsartan 80 mg daily.  Check BMP in 1 week.  She will check her blood pressures at home and bring to follow-up.  Continue HCTZ.

## 2022-12-21 NOTE — Patient Instructions (Addendum)
Medication Instructions:  TAKE FUROSEMIDE 20 MG DAILY FOR 3 DAYS ONLY   STOP NIFEDIPINE   START VALSARTAN 80 MG DAILY   STOP SIMVASTATIN   START ROSUVASTATIN 20 MG DAILY   *If you need a refill on your cardiac medications before your next appointment, please call your pharmacy*   Lab Work: BMET/BNP IN 1 WEEK   FASTING LP/CMET IN 2-3 MONTHS   If you have labs (blood work) drawn today and your tests are completely normal, you will receive your results only by: MyChart Message (if you have MyChart) OR A paper copy in the mail If you have any lab test that is abnormal or we need to change your treatment, we will call you to review the results.   Testing/Procedures: Your physician has requested that you have an echocardiogram. Echocardiography is a painless test that uses sound waves to create images of your heart. It provides your doctor with information about the size and shape of your heart and how well your heart's chambers and valves are working. This procedure takes approximately one hour. There are no restrictions for this procedure. Please do NOT wear cologne, perfume, aftershave, or lotions (deodorant is allowed). Please arrive 15 minutes prior to your appointment time.   Your physician has requested that you have cardiac CT. Cardiac computed tomography (CT) is a painless test that uses an x-ray machine to take clear, detailed pictures of your heart. For further information please visit [email or web address] Please follow instruction sheet as given. ONCE INSURANCE HAS BEEN REVIEWED THE OFFICE WILL CALL YOU TO SCHEDULE. IF YOU DO NOT HEAR IN 2 WEEKS CALL TO FOLLOW UP   Follow-Up: At [facility], you and your health needs are our priority.  As part of our continuing mission to provide you with exceptional heart care, we have created designated Provider Care Teams.  These Care Teams include your primary Cardiologist (physician) and Advanced Practice Providers (APPs -   Physician Assistants and Nurse Practitioners) who all work together to provide you with the care you need, when you need it.  We recommend signing up for the patient portal called "MyChart".  Sign up information is provided on this After Visit Summary.  MyChart is used to connect with patients for Virtual Visits (Telemedicine).  Patients are able to view lab/test results, encounter notes, upcoming appointments, etc.  Non-urgent messages can be sent to your provider as well.   To learn more about what you can do with MyChart, go to [email or web address].    Your next appointment:   6 week(s)  Provider:   Gillian Shields, NP    6 MONTHS WITH DR [facility]  Other Instructions    Your cardiac CT will be scheduled at one of the below locations:   [facility] [address] [place], [place] [address] [phone]  OR  [facility] [address] [place], [place] [address] [phone]  OR   [facility] [address] [place], [place] [address] [phone]  If scheduled at [facility], please arrive at the [facility]'s Entrance (Entrance C2) of [facility] 30 minutes prior to test start time. You can use the FREE valet parking offered at entrance C (encouraged to control the heart rate for the test)  Proceed to the [facility] Radiology Department (first floor) to check-in and test prep.  All radiology patients and guests should use entrance C2 at [facility], accessed from [place]  [facility], even though the hospital's physical address listed is [address]    If scheduled at [facility] or [facility], please arrive 15 mins early for check-in and test prep.   Please follow these instructions carefully (unless otherwise directed):  Hold all erectile dysfunction medications at least 3 days (72  hrs) prior to test. (Ie viagra, cialis, sildenafil, tadalafil, etc) We will administer nitroglycerin during this exam.   On the Night Before the Test: Be sure to Drink plenty of water. Do not consume any caffeinated/decaffeinated beverages or chocolate 12 hours prior to your test. Do not take any antihistamines 12 hours prior to your test  On the Day of the Test: Drink plenty of water until 1 hour prior to the test. Do not eat any food 1 hour prior to test. You may take your regular medications prior to the test.  Take metoprolol (Lopressor) two hours prior to test. If you take Furosemide/Hydrochlorothiazide/Spironolactone, please HOLD on the morning of the test. FEMALES- please wear underwire-free bra if available, avoid dresses & tight clothing      After the Test: Drink plenty of water. After receiving IV contrast, you may experience a mild flushed feeling. This is normal. On occasion, you may experience a mild rash up to 24 hours after the test. This is not dangerous. If this occurs, you can take Benadryl 25 mg and increase your fluid intake. If you experience trouble breathing, this can be serious. If it is severe call 911 IMMEDIATELY. If it is mild, please call our office. If you take any of these medications: Glipizide/Metformin, Avandament, Glucavance, please do not take 48 hours after completing test unless otherwise instructed.  We will call to schedule your test 2-4 weeks out understanding that some insurance companies will need an authorization prior to the service being performed.   For non-scheduling related questions, please contact the cardiac imaging nurse navigator should you have any questions/concerns: Rockwell [place], Cardiac Imaging Nurse Navigator Larey Brick, Cardiac Imaging Nurse Navigator [facility] Direct Office Dial: [phone]   For scheduling needs, including cancellations and rescheduling, please call [place],  [phone].

## 2022-12-26 ENCOUNTER — Telehealth (HOSPITAL_COMMUNITY): Payer: Self-pay | Admitting: *Deleted

## 2022-12-26 NOTE — Telephone Encounter (Signed)
Reaching out to patient to offer assistance regarding upcoming cardiac imaging study; pt verbalizes understanding of appt date/time, parking situation and where to check in, pre-test NPO status, and verified current allergies; name and call back number provided for further questions should they arise  Larey Brick RN Navigator Cardiac Imaging [facility] [phone] office [phone] cell  Patient aware to obtain labs prior to appt. She is to arrive at 1:30pm.

## 2022-12-27 DIAGNOSIS — R609 Edema, unspecified: Secondary | ICD-10-CM | POA: Diagnosis not present

## 2022-12-27 DIAGNOSIS — R079 Chest pain, unspecified: Secondary | ICD-10-CM | POA: Diagnosis not present

## 2022-12-27 DIAGNOSIS — Z5181 Encounter for therapeutic drug level monitoring: Secondary | ICD-10-CM | POA: Diagnosis not present

## 2022-12-28 LAB — BASIC METABOLIC PANEL
BUN/Creatinine Ratio: 21 (ref 12–28)
BUN: 28 mg/dL — ABNORMAL HIGH (ref 8–27)
CO2: 28 mmol/L (ref 20–29)
Calcium: 9.4 mg/dL (ref 8.7–10.3)
Chloride: 101 mmol/L (ref 96–106)
Creatinine, Ser: 1.32 mg/dL — ABNORMAL HIGH (ref 0.57–1.00)
Glucose: 84 mg/dL (ref 70–99)
Potassium: 4.1 mmol/L (ref 3.5–5.2)
Sodium: 141 mmol/L (ref 134–144)
eGFR: 43 mL/min/{1.73_m2} — ABNORMAL LOW (ref >59)

## 2022-12-28 LAB — BRAIN NATRIURETIC PEPTIDE: BNP: 18.7 pg/mL (ref 0.0–100.0)

## 2022-12-29 ENCOUNTER — Inpatient Hospital Stay: Payer: Medicare HMO | Attending: Hematology & Oncology

## 2022-12-29 ENCOUNTER — Encounter: Payer: Self-pay | Admitting: Hematology & Oncology

## 2022-12-29 ENCOUNTER — Other Ambulatory Visit: Payer: Self-pay

## 2022-12-29 ENCOUNTER — Inpatient Hospital Stay: Payer: Medicare HMO | Admitting: Hematology & Oncology

## 2022-12-29 ENCOUNTER — Ambulatory Visit (HOSPITAL_BASED_OUTPATIENT_CLINIC_OR_DEPARTMENT_OTHER)
Admission: RE | Admit: 2022-12-29 | Discharge: 2022-12-29 | Disposition: A | Discharge status: Home / Self Care | Payer: Medicare HMO | Source: Ambulatory Visit | Attending: Cardiovascular Disease | Admitting: Cardiovascular Disease

## 2022-12-29 VITALS — BP 131/63 | HR 55 | Temp 97.7°F | Resp 18 | Ht <= 58 in | Wt 184.1 lb

## 2022-12-29 DIAGNOSIS — M7989 Other specified soft tissue disorders: Secondary | ICD-10-CM | POA: Insufficient documentation

## 2022-12-29 DIAGNOSIS — C7951 Secondary malignant neoplasm of bone: Secondary | ICD-10-CM | POA: Insufficient documentation

## 2022-12-29 DIAGNOSIS — R609 Edema, unspecified: Secondary | ICD-10-CM | POA: Insufficient documentation

## 2022-12-29 DIAGNOSIS — R072 Precordial pain: Secondary | ICD-10-CM | POA: Diagnosis not present

## 2022-12-29 DIAGNOSIS — I2510 Atherosclerotic heart disease of native coronary artery without angina pectoris: Secondary | ICD-10-CM | POA: Insufficient documentation

## 2022-12-29 DIAGNOSIS — C3432 Malignant neoplasm of lower lobe, left bronchus or lung: Secondary | ICD-10-CM | POA: Diagnosis not present

## 2022-12-29 DIAGNOSIS — C3490 Malignant neoplasm of unspecified part of unspecified bronchus or lung: Secondary | ICD-10-CM

## 2022-12-29 DIAGNOSIS — R079 Chest pain, unspecified: Secondary | ICD-10-CM | POA: Insufficient documentation

## 2022-12-29 DIAGNOSIS — Z79899 Other long term (current) drug therapy: Secondary | ICD-10-CM | POA: Diagnosis not present

## 2022-12-29 LAB — CBC WITH DIFFERENTIAL (CANCER CENTER ONLY)
Abs Immature Granulocytes: 0.02 10*3/uL (ref 0.00–0.07)
Basophils Absolute: 0 10*3/uL (ref 0.0–0.1)
Basophils Relative: 1 %
Eosinophils Absolute: 0.1 10*3/uL (ref 0.0–0.5)
Eosinophils Relative: 3 %
HCT: 32.4 % — ABNORMAL LOW (ref 36.0–46.0)
Hemoglobin: 11.1 g/dL — ABNORMAL LOW (ref 12.0–15.0)
Immature Granulocytes: 1 %
Lymphocytes Relative: 29 %
Lymphs Abs: 1.2 10*3/uL (ref 0.7–4.0)
MCH: 34.3 pg — ABNORMAL HIGH (ref 26.0–34.0)
MCHC: 34.3 g/dL (ref 30.0–36.0)
MCV: 100 fL (ref 80.0–100.0)
Monocytes Absolute: 0.4 10*3/uL (ref 0.1–1.0)
Monocytes Relative: 11 %
Neutro Abs: 2.3 10*3/uL (ref 1.7–7.7)
Neutrophils Relative %: 55 %
Platelet Count: 177 10*3/uL (ref 150–400)
RBC: 3.24 MIL/uL — ABNORMAL LOW (ref 3.87–5.11)
RDW: 14.2 % (ref 11.5–15.5)
WBC Count: 4 10*3/uL (ref 4.0–10.5)
nRBC: 0 % (ref 0.0–0.2)

## 2022-12-29 LAB — CMP (CANCER CENTER ONLY)
ALT: 55 U/L — ABNORMAL HIGH (ref 0–44)
AST: 52 U/L — ABNORMAL HIGH (ref 15–41)
Albumin: 4.4 g/dL (ref 3.5–5.0)
Alkaline Phosphatase: 74 U/L (ref 38–126)
Anion gap: 7 (ref 5–15)
BUN: 28 mg/dL — ABNORMAL HIGH (ref 8–23)
CO2: 32 mmol/L (ref 22–32)
Calcium: 10.3 mg/dL (ref 8.9–10.3)
Chloride: 103 mmol/L (ref 98–111)
Creatinine: 1.21 mg/dL — ABNORMAL HIGH (ref 0.44–1.00)
GFR, Estimated: 48 mL/min — ABNORMAL LOW (ref >60)
Glucose, Bld: 97 mg/dL (ref 70–99)
Potassium: 4.8 mmol/L (ref 3.5–5.1)
Sodium: 142 mmol/L (ref 135–145)
Total Bilirubin: 0.9 mg/dL (ref 0.3–1.2)
Total Protein: 6.7 g/dL (ref 6.5–8.1)

## 2022-12-29 LAB — LACTATE DEHYDROGENASE: LDH: 254 U/L — ABNORMAL HIGH (ref 98–192)

## 2022-12-29 MED ORDER — FUROSEMIDE 20 MG PO TABS: ORAL_TABLET | ORAL | 3 refills | Status: DC

## 2022-12-29 MED ORDER — NITROGLYCERIN 0.4 MG SL SUBL
SUBLINGUAL_TABLET | SUBLINGUAL | Status: AC
  Filled 2022-12-29: qty 2

## 2022-12-29 MED ORDER — NITROGLYCERIN 0.4 MG SL SUBL
0.8000 mg | SUBLINGUAL_TABLET | Freq: Once | SUBLINGUAL | Status: AC
  Administered 2022-12-29: 0.8 mg via SUBLINGUAL

## 2022-12-29 MED ORDER — IOHEXOL 350 MG/ML SOLN
95.0000 mL | Freq: Once | INTRAVENOUS | Status: AC | PRN
  Administered 2022-12-29: 95 mL via INTRAVENOUS

## 2022-12-29 NOTE — Progress Notes (Signed)
Patient states, "I feel good." No symptoms.

## 2022-12-29 NOTE — Progress Notes (Signed)
Hematology and Oncology Follow Up Visit  Laura Bush [id number] [birth year] 72 y.o. 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 3 months  -next dose in 02/2023  S/P SBRT to LUL -- completed on 03/04/2022 S/P XRT to RIGHT femur -- completed on 03/04/2022 Alecensa 600 mg po BID -- start on 03/24/2022     Interim History:  [name] is back for follow-up.  She has little bit swelling in her legs.  She has seen cardiology.  They put her on Lasix for 3 days which worked quite well.  Unfortunately, she does not have any more Lasix.  I will call the Lasix in for her.    She did have a PET scan that was done.  This was done on 12/15/2022.  The PET scan thankfully did not show any evidence of obvious active disease.  I am very happy about this.  We are monitoring the CEA level.  When we last saw her, the CEA level was 15.7.  She has had no problems with nausea or vomiting.  There is no change in bowel or bladder habits.  She has no fever.  There is no bleeding.  Currently, I would say her performance status is probably ECOG 1.    Medications:  Current Outpatient Medications:    acetaminophen (TYLENOL) 500 MG tablet, Take 1 tablet (500 mg total) by mouth every 8 (eight) hours as needed for mild pain or moderate pain., Disp: 30 tablet, Rfl: 0   alectinib (ALECENSA) 150 MG capsule, Take 4 capsules (600 mg total) by mouth 2 (two) times daily with a meal., Disp: 240 capsule, Rfl: 6   aMILoride (MIDAMOR) 5 MG tablet, TAKE ONE TABLET BY MOUTH DAILY, Disp: 90 tablet, Rfl: 3   aspirin EC 81 MG tablet, Take 81 mg by mouth daily. Swallow whole., Disp: , Rfl:    B Complex-C (SUPER B COMPLEX PO), Take 1 tablet by mouth daily with breakfast., Disp: , Rfl:    Calcium Carb-Cholecalciferol (CALCIUM + D3 PO), Take 1 tablet by  mouth daily., Disp: , Rfl:    cholecalciferol (VITAMIN D3) 25 MCG (1000 UNIT) tablet, Take 1,000 Units by mouth daily., Disp: , Rfl:    diphenhydrAMINE (BENADRYL) 25 mg capsule, Take 25 mg by mouth at bedtime as needed., Disp: , Rfl:    fexofenadine (ALLEGRA) 180 MG tablet, Take 180 mg by mouth in the morning., Disp: , Rfl:    folic acid (FOLATE) 400 MCG tablet, Take 400 mcg by mouth daily., Disp: , Rfl:    furosemide (LASIX) 20 MG tablet, 1 TABLET DAILY FOR 3 DAYS, Disp: 3 tablet, Rfl: 0   hydrochlorothiazide (HYDRODIURIL) 25 MG tablet, Take 1 tablet (25 mg total) by mouth daily., Disp: 90 tablet, Rfl: 1   ipratropium (ATROVENT) 0.06 % nasal spray, Place 2 sprays into both nostrils 4 (four) times daily. Use as needed, Disp: 15 mL, Rfl: 4   metFORMIN (GLUCOPHAGE) 500 MG tablet, Take 2 pills (1000 mg) at lunch and 1 pill (500 mg) at supper., Disp: 270 tablet, Rfl: 0   pantoprazole (PROTONIX) 40 MG tablet, TAKE ONE TABLET BY MOUTH DAILY, Disp: 90 tablet, Rfl: 3   polyethylene glycol (MIRALAX / GLYCOLAX) 17 g packet, Take 17 g by mouth daily., Disp: , Rfl:    rosuvastatin (CRESTOR) 20 MG  tablet, Take 1 tablet (20 mg total) by mouth daily., Disp: 90 tablet, Rfl: 3   traZODone (DESYREL) 50 MG tablet, TAKE ONE TABLET BY MOUTH EVERY NIGHT AT BEDTIME AS NEEDED FOR SLEEP, Disp: 30 tablet, Rfl: 3   valsartan (DIOVAN) 80 MG tablet, Take 1 tablet (80 mg total) by mouth daily., Disp: 90 tablet, Rfl: 3  Allergies:  Allergies  Allergen Reactions   Diclofenac Hypertension   Amlodipine Swelling and Other (See Comments)    Limbs swell, not the throat   Lanolin Other (See Comments)    Sneezing and watery eyes. Allergic to [facility].   Tape Other (See Comments)    Redness (Bandaids also)    Past Medical History, Surgical history, Social history, and Family History were reviewed and updated.  Review of Systems: Review of Systems  Constitutional: Negative.   HENT:  Negative.    Eyes: Negative.   Respiratory:  Negative.    Cardiovascular: Negative.   Gastrointestinal: Negative.   Endocrine: Negative.   Genitourinary: Negative.    Musculoskeletal:  Positive for arthralgias.  Skin: Negative.   Neurological: Negative.   Hematological: Negative.   Psychiatric/Behavioral: Negative.      Physical Exam:  height is 4\' 10"  (1.473 m) and weight is 184 lb 1.9 oz (83.5 kg). Her oral temperature is 97.7 F (36.5 C). Her blood pressure is 131/63 and her pulse is 55 (abnormal). Her respiration is 18 and oxygen saturation is 98%.   Wt Readings from Last 3 Encounters:  12/29/22 184 lb 1.9 oz (83.5 kg)  12/21/22 188 lb 11.2 oz (85.6 kg)  11/14/22 181 lb 0.6 oz (82.1 kg)    Physical Exam Vitals reviewed.  HENT:     Head: Normocephalic and atraumatic.  Eyes:     Pupils: Pupils are equal, round, and reactive to light.  Cardiovascular:     Rate and Rhythm: Normal rate and regular rhythm.     Heart sounds: Normal heart sounds.  Pulmonary:     Effort: Pulmonary effort is normal.     Breath sounds: Normal breath sounds.  Abdominal:     General: Bowel sounds are normal.     Palpations: Abdomen is soft.  Musculoskeletal:        General: No tenderness or deformity. Normal range of motion.     Cervical back: Normal range of motion.     Comments: She has about 2+ edema in the lower legs.  I cannot palpate any venous cord.  She has a negative Homans' sign.  Lymphadenopathy:     Cervical: No cervical adenopathy.  Skin:    General: Skin is warm and dry.     Findings: No erythema or rash.  Neurological:     Mental Status: She is alert and oriented to person, place, and time.  Psychiatric:        Behavior: Behavior normal.        Thought Content: Thought content normal.        Judgment: Judgment normal.      Lab Results  Component Value Date   WBC 4.0 12/29/2022   HGB 11.1 (L) 12/29/2022   HCT 32.4 (L) 12/29/2022   MCV 100.0 12/29/2022   PLT 177 12/29/2022     Chemistry      Component Value  Date/Time   NA 142 12/29/2022 0908   NA 141 12/27/2022 0812   NA 142 05/29/2017 1002   K 4.8 12/29/2022 0908   K 3.9 05/29/2017 1002   CL 103 12/29/2022  0908   CO2 32 12/29/2022 0908   CO2 28 05/29/2017 1002   BUN 28 (H) 12/29/2022 0908   BUN 28 (H) 12/27/2022 0812   BUN 13.5 05/29/2017 1002   CREATININE 1.21 (H) 12/29/2022 0908   CREATININE 0.81 04/06/2020 1059   CREATININE 0.8 05/29/2017 1002      Component Value Date/Time   CALCIUM 10.3 12/29/2022 0908   CALCIUM 9.4 05/29/2017 1002   ALKPHOS 74 12/29/2022 0908   ALKPHOS 39 (L) 05/29/2017 1002   AST 52 (H) 12/29/2022 0908   AST 18 05/29/2017 1002   ALT 55 (H) 12/29/2022 0908   ALT 19 05/29/2017 1002   BILITOT 0.9 12/29/2022 0908   BILITOT 0.48 05/29/2017 1002       Impression and Plan: [name] is a very charming 72 year old white female.  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  This was actually a small lesion.  She is on the [place].  She has been on Alecensa for a good 11 months.  Right now, we will just plan to get her back in July.  I think we get her through the month of June.  If she has any problems, she can always let us know.    Josph Macho, MD 5/23/202410:13 AM

## 2022-12-30 ENCOUNTER — Encounter: Payer: Self-pay | Admitting: *Deleted

## 2022-12-30 NOTE — Progress Notes (Signed)
Patient seen yesterday for follow up. She continues to do well with a recent PET scan showing good disease control. Will continue to follow.  Oncology Nurse Navigator Documentation     12/30/2022    8:45 AM  Oncology Nurse Navigator Flowsheets  Navigator Follow Up Date: 02/15/2023  Navigator Follow Up Reason: Follow-up Appointment  Navigator Location [facility]  Navigator Encounter Type Appt/Treatment Plan Review  Patient Visit Type MedOnc  Treatment Phase Active Tx  Barriers/Navigation Needs No Barriers At This Time  Interventions None Required  Acuity Level 1-No Barriers  Support Groups/Services Friends and Family  Time Spent with Patient 15

## 2023-01-02 NOTE — Patient Instructions (Incomplete)
Good to see you again today  

## 2023-01-02 NOTE — Progress Notes (Unsigned)
[facility] [address], [address] [place], [place] [address] [phone]  Date:  01/04/2023   Name:  Laura Bush   DOB:  [birth year]   MRN:  [id number]  PCP:  Pearline Cables, MD    Chief Complaint: left side pain (X 6-8 weeks. Has seen card and had imaging. Always present, sharp. Responds to Nsaid. )   History of Present Illness:  Laura Bush is a 72 y.o. very pleasant female patient who presents with the following:  Pt seen today with concern of left sided ribcage pain Last seen by myself in April of this year- hypertension, venous insufficiency, GERD, hypercholesterolemia, and morbid obesity,  breast cancer diagnosed 2016 followed by oncology, hemochromatosis being managed by hematology. 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 3 months  -next dose in 02/2023  S/P SBRT to LUL -- completed on 03/04/2022 S/P XRT to RIGHT femur -- completed on 03/04/2022 Alecensa 600 mg po BID -- start on 03/24/2022  She has noted a left side pain for 6-8 weeks Dr Myna Hidalgo did a plain x-ray and all was ok She also saw cardiology-Dr. Duke Salvia, on 5/15-chest pain thought to be musculoskeletal.  They did get a CT coronary No precipitant injury that she can recall She notes ibuprofen helps, but they are keeping an eye on her kidney function so she tries not to overuse this medication Tylenol does help some as well but has not completely resolve her pain She notes the pain seemed to start just anteriorly/under the left breast and now will  radaite into her back as well on the left side only Wearing a bra bothers her so she is really wearing 1 No rash on her skin now or previously No cough or fever  No breast changes noted, mammo UTD No gastrointestinal symptoms  Per cardiology note 5/15: CAD (coronary artery disease) CAD noted on CT and PET imaging.  She has atypical chest pain that seems musculoskeletal.  She isn't getting much exercise so it is challenging to know if there is any exertional component.  We will get a coronary CT-a 2 assess the severity of her CAD.  Continue aspirin.  She is bradycardic so no need for beta-blocker.  Switch simvastatin to rosuvastatin.  LDL goal is less than 70.  Repeat fasting lipids and a CMP in 2 to 3 months.     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/29/2017   Vitamin D deficiency 01/29/2016   Eczema 12/29/2014   Venous insufficiency 10/16/2014   Essential hypertension 10/01/2014   Hypercholesteremia 10/01/2014   GERD (gastroesophageal reflux disease) 10/01/2014    Other bilateral bundle branch block 06/23/2003    Past Medical History:  Diagnosis Date   Allergy    seasonal   Arthritis Last several years   Mostly in hands, wrists, shoulders, knees   Asthma    in cold weather   Breast cancer (HCC) 09/18/2014   left breast   Breast cancer (HCC) 10/07/2014   bx left breast   Breast cancer of upper-outer quadrant of left female breast (HCC) 09/22/2014   Cluster headaches    in her 40's   GERD (gastroesophageal reflux disease)    Goals of care, counseling/discussion 01/14/2022   Heart murmur    as a child (has outgrown)   High cholesterol    History of radiation therapy    Left lung, Right Femur- 02/16/22-03/04/22- Dr. 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"   Stroke [facility]) summer 2022   described as tiny   Swallowing difficulty    Venous insufficiency     Past Surgical History:  Procedure Laterality Date   ABDOMINAL HYSTERECTOMY  1999   APPENDECTOMY  ~ 2006   BONE BIOPSY Right 01/04/2022   Procedure: RIGHT FEMORAL BONE BIOPSY;  Surgeon: Myrene Galas, MD;  Location: [facility] OR;  Service: Orthopedics;  Laterality: Right;   BREAST BIOPSY Left 10/2014   BREAST LUMPECTOMY Left 2016   BREAST REDUCTION SURGERY Bilateral 11/11/2014   Procedure: Bilateral Breast Reduction;  Surgeon: Etter Sjogren, MD;  Location: [facility] OR;  Service: Plastics;  Laterality: Bilateral;   BRONCHIAL BIOPSY  01/06/2022   Procedure: BRONCHIAL BIOPSIES;  Surgeon: Leslye Peer, MD;  Location: [facility]  ENDOSCOPY;  Service: Pulmonary;;   BRONCHIAL BRUSHINGS  01/06/2022   Procedure: BRONCHIAL BRUSHINGS;  Surgeon: Leslye Peer, MD;  Location: [facility] ENDOSCOPY;  Service: Pulmonary;;   BRONCHIAL NEEDLE ASPIRATION BIOPSY  01/06/2022   Procedure: BRONCHIAL NEEDLE ASPIRATION BIOPSIES;  Surgeon: Leslye Peer, MD;  Location: [facility] ENDOSCOPY;  Service: Pulmonary;;   BRONCHIAL WASHINGS  01/06/2022   Procedure: BRONCHIAL WASHINGS;  Surgeon: Leslye Peer, MD;  Location: [facility] ENDOSCOPY;  Service: Pulmonary;;   CARPAL TUNNEL RELEASE Bilateral    2 surgeries on right, 1 on left   CESAREAN SECTION  1978; 1980   COLONOSCOPY     FEMUR IM NAIL Right 01/04/2022   Procedure: RIGHT FEMORAL INTRAMEDULLARY (IM) NAIL;  Surgeon: Myrene Galas, MD;  Location: [facility] OR;  Service: Orthopedics;  Laterality: Right;   FIDUCIAL MARKER  PLACEMENT  01/06/2022   Procedure: FIDUCIAL MARKER PLACEMENT;  Surgeon: Leslye Peer, MD;  Location: [facility] ENDOSCOPY;  Service: Pulmonary;;   FOREARM FRACTURE SURGERY Right 2003 X 3   FRACTURE SURGERY  2003, 2023   3 surgeries, right arm -03; Femur-23   KNEE ARTHROSCOPY Bilateral    meniscus repair   PARTIAL MASTECTOMY WITH NEEDLE LOCALIZATION AND AXILLARY SENTINEL LYMPH NODE BX Left 11/11/2014   Procedure: LEFT BREAST PARTIAL MASTECTOMY WITH NEEDLE LOCALIZATION TIMES TWO AND LEFT AXILLARY SENTINEL LYMPH NODE Biopsy;  Surgeon: Claud Kelp, MD;  Location: [facility] OR;  Service: General;  Laterality: Left;   REDUCTION MAMMAPLASTY Bilateral 2016   SQUAMOUS CELL CARCINOMA EXCISION  1980's X 1   "nose"   TONSILLECTOMY  ~ 1957   TUBAL LIGATION  ?1984    Social History   Tobacco Use   Smoking status: Never   Smokeless tobacco: Never  Vaping Use   Vaping Use: Never used  Substance Use Topics   Alcohol use: Yes    Alcohol/week: 2.0 standard drinks of alcohol    Types: 2 Glasses of wine per week    Comment: 2 glasses a week   Drug use: No    Family History  Problem Relation Age of Onset    Dementia Mother        Lives in [place]   Hypertension Mother    Hyperlipidemia Mother    Stroke Father        deceased   Heart failure Father    Hypertension Father    Uterine cancer Sister 84   Heart attack Maternal Grandmother    Hypertension Maternal Grandfather    Stroke Paternal Grandfather    Colon cancer Neg Hx    Esophageal cancer Neg Hx    Pancreatic cancer Neg Hx    Stomach cancer Neg Hx    Liver disease Neg Hx    Sleep apnea Neg Hx     Allergies  Allergen Reactions   Diclofenac Hypertension   Amlodipine Swelling and Other (See Comments)    Limbs swell, not the throat   Lanolin Other (See Comments)    Sneezing and watery eyes. Allergic to [facility].   Tape Other (See Comments)    Redness (Bandaids also)    Medication list has been reviewed and updated.  Current Outpatient Medications on File Prior to Visit  Medication Sig Dispense Refill   acetaminophen (TYLENOL) 500 MG tablet Take 1 tablet (500 mg total) by mouth every 8 (eight) hours as needed for mild pain or moderate pain. 30 tablet 0   alectinib (ALECENSA) 150 MG capsule Take 4 capsules (600 mg total) by mouth 2 (two) times daily with a meal. 240 capsule 6   aMILoride (MIDAMOR) 5 MG tablet TAKE ONE TABLET BY MOUTH DAILY 90 tablet 3   aspirin EC 81 MG tablet Take 81 mg by mouth daily. Swallow whole.     B Complex-C (SUPER B COMPLEX PO) Take 1 tablet by mouth daily with breakfast.     Calcium Carb-Cholecalciferol (CALCIUM + D3 PO) Take 1 tablet by mouth daily.     cholecalciferol (VITAMIN D3) 25 MCG (1000 UNIT) tablet Take 1,000 Units by mouth daily.     diphenhydrAMINE (BENADRYL) 25 mg capsule Take 25 mg by mouth at bedtime as needed.     fexofenadine (ALLEGRA) 180 MG tablet Take 180 mg by mouth in the morning.     folic acid (FOLATE) 400 MCG tablet Take 400 mcg by mouth daily.  furosemide (LASIX) 20 MG tablet Please take 1 tablet daily as needed for leg swelling. 30 tablet 3   hydrochlorothiazide  (HYDRODIURIL) 25 MG tablet Take 1 tablet (25 mg total) by mouth daily. 90 tablet 1   ipratropium (ATROVENT) 0.06 % nasal spray Place 2 sprays into both nostrils 4 (four) times daily. Use as needed 15 mL 4   metFORMIN (GLUCOPHAGE) 500 MG tablet Take 2 pills (1000 mg) at lunch and 1 pill (500 mg) at supper. 270 tablet 0   pantoprazole (PROTONIX) 40 MG tablet TAKE ONE TABLET BY MOUTH DAILY 90 tablet 3   polyethylene glycol (MIRALAX / GLYCOLAX) 17 g packet Take 17 g by mouth daily.     rosuvastatin (CRESTOR) 20 MG tablet Take 1 tablet (20 mg total) by mouth daily. 90 tablet 3   traZODone (DESYREL) 50 MG tablet TAKE ONE TABLET BY MOUTH EVERY NIGHT AT BEDTIME AS NEEDED FOR SLEEP 30 tablet 3   valsartan (DIOVAN) 80 MG tablet Take 1 tablet (80 mg total) by mouth daily. 90 tablet 3   No current facility-administered medications on file prior to visit.    Review of Systems:  As per HPI- otherwise negative.   Physical Examination: Vitals:   01/04/23 1342  BP: 122/80  Pulse: 60  Resp: 18  Temp: 98.1 F (36.7 C)  SpO2: 97%   Vitals:   01/04/23 1342  Weight: 181 lb 3.2 oz (82.2 kg)  Height: 4\' 10"  (1.473 m)   Body mass index is 37.87 kg/m. Ideal Body Weight: Weight in (lb) to have BMI = 25: 119.4  GEN: no acute distress.  Mildly obese, looks well HEENT: Atraumatic, Normocephalic.  Ears and Nose: No external deformity. CV: RRR, No M/G/R. No JVD. No thrill. No extra heart sounds. PULM: CTA B, no wheezes, crackles, rhonchi. No retractions. No resp. distress. No accessory muscle use. ABD: S, NT, ND, +BS. No rebound. No HSM. EXTR: No c/c/e PSYCH: Normally interactive. Conversant.  Unable to easily reproduce her discomfort by pressing on the lower rib border under the left breast, both anteriorly, laterally and posteriorly.  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  She had negative plain films on 4/8 She had a CT coronary on 5/23 but this did not completely visualize her chest However, she also had a PET scan on 5/9 which was normal, making a new oncologic lesion less likely I explained to patient I am not certain if any additional imaging would be beneficial at this time.  I will call and discussed with the radiologist to read her PET scan-?  What a nonpathologic fracture also had appearance In the meantime we decided to try a week of Valtrex in case this may be an occult shingles-I do not think this will harm her in any way Signed Abbe Amsterdam, MD

## 2023-01-03 DIAGNOSIS — R69 Illness, unspecified: Secondary | ICD-10-CM | POA: Diagnosis not present

## 2023-01-04 ENCOUNTER — Ambulatory Visit (INDEPENDENT_AMBULATORY_CARE_PROVIDER_SITE_OTHER): Payer: Medicare HMO | Admitting: Family Medicine

## 2023-01-04 VITALS — BP 122/80 | HR 60 | Temp 98.1°F | Resp 18 | Ht <= 58 in | Wt 181.2 lb

## 2023-01-04 DIAGNOSIS — C50812 Malignant neoplasm of overlapping sites of left female breast: Secondary | ICD-10-CM | POA: Diagnosis not present

## 2023-01-04 DIAGNOSIS — C7951 Secondary malignant neoplasm of bone: Secondary | ICD-10-CM

## 2023-01-04 DIAGNOSIS — R0781 Pleurodynia: Secondary | ICD-10-CM | POA: Diagnosis not present

## 2023-01-04 DIAGNOSIS — Z170 Estrogen receptor positive status [ER+]: Secondary | ICD-10-CM | POA: Diagnosis not present

## 2023-01-04 DIAGNOSIS — E782 Mixed hyperlipidemia: Secondary | ICD-10-CM | POA: Diagnosis not present

## 2023-01-04 DIAGNOSIS — C3490 Malignant neoplasm of unspecified part of unspecified bronchus or lung: Secondary | ICD-10-CM

## 2023-01-04 DIAGNOSIS — G4733 Obstructive sleep apnea (adult) (pediatric): Secondary | ICD-10-CM | POA: Diagnosis not present

## 2023-01-04 DIAGNOSIS — I10 Essential (primary) hypertension: Secondary | ICD-10-CM

## 2023-01-04 MED ORDER — VALACYCLOVIR HCL 1 G PO TABS
1000.0000 mg | ORAL_TABLET | Freq: Two times a day (BID) | ORAL | 0 refills | Status: DC
Start: 2023-01-04 — End: 2023-02-15

## 2023-01-05 ENCOUNTER — Encounter: Payer: Self-pay | Admitting: Family Medicine

## 2023-01-09 ENCOUNTER — Telehealth (HOSPITAL_BASED_OUTPATIENT_CLINIC_OR_DEPARTMENT_OTHER): Payer: Self-pay

## 2023-01-09 NOTE — Telephone Encounter (Addendum)
Seen by patient Laura Bush on 01/09/2023  6:05 AM; follow up mychart message sent to patient   ----- Message from Alver Sorrow, NP sent at 01/08/2023  7:20 PM EDT ----- Coronary CTA with calcium score of 242 with overall minimal nonobstructive coronary disease.   Continue Aspirin, Rosuvastatin with repeat cholesterol labs in 2-3 months as recommended at last office visit.

## 2023-01-18 ENCOUNTER — Encounter: Payer: Self-pay | Admitting: Family Medicine

## 2023-01-18 ENCOUNTER — Ambulatory Visit (HOSPITAL_BASED_OUTPATIENT_CLINIC_OR_DEPARTMENT_OTHER)
Admission: RE | Admit: 2023-01-18 | Discharge: 2023-01-18 | Disposition: A | Discharge status: Home / Self Care | Payer: Medicare HMO | Source: Ambulatory Visit | Attending: Cardiovascular Disease | Admitting: Cardiovascular Disease

## 2023-01-18 DIAGNOSIS — R600 Localized edema: Secondary | ICD-10-CM | POA: Diagnosis not present

## 2023-01-18 DIAGNOSIS — R0609 Other forms of dyspnea: Secondary | ICD-10-CM | POA: Diagnosis not present

## 2023-01-18 DIAGNOSIS — R609 Edema, unspecified: Secondary | ICD-10-CM | POA: Insufficient documentation

## 2023-01-18 DIAGNOSIS — R079 Chest pain, unspecified: Secondary | ICD-10-CM | POA: Insufficient documentation

## 2023-01-18 LAB — ECHOCARDIOGRAM COMPLETE
AR max vel: 1.82 cm2
AV Area VTI: 2.1 cm2
AV Area mean vel: 1.84 cm2
AV Mean grad: 7 mmHg
AV Peak grad: 15.1 mmHg
Ao pk vel: 1.94 m/s
Area-P 1/2: 2.75 cm2
Calc EF: 70.6 %
S' Lateral: 2.6 cm
Single Plane A2C EF: 72.4 %
Single Plane A4C EF: 66.6 %

## 2023-01-18 MED ORDER — PERFLUTREN LIPID MICROSPHERE
1.0000 mL | INTRAVENOUS | Status: AC | PRN
  Administered 2023-01-18: 3 mL via INTRAVENOUS

## 2023-01-19 ENCOUNTER — Other Ambulatory Visit (INDEPENDENT_AMBULATORY_CARE_PROVIDER_SITE_OTHER): Payer: Self-pay | Admitting: Family Medicine

## 2023-01-19 DIAGNOSIS — E88819 Insulin resistance, unspecified: Secondary | ICD-10-CM

## 2023-01-24 DIAGNOSIS — R69 Illness, unspecified: Secondary | ICD-10-CM | POA: Diagnosis not present

## 2023-01-26 ENCOUNTER — Encounter: Payer: Self-pay | Admitting: Hematology & Oncology

## 2023-01-31 ENCOUNTER — Encounter (HOSPITAL_BASED_OUTPATIENT_CLINIC_OR_DEPARTMENT_OTHER): Payer: Self-pay | Admitting: Family

## 2023-01-31 ENCOUNTER — Ambulatory Visit (HOSPITAL_BASED_OUTPATIENT_CLINIC_OR_DEPARTMENT_OTHER): Payer: Medicare HMO | Admitting: Family

## 2023-01-31 ENCOUNTER — Other Ambulatory Visit: Payer: Self-pay | Admitting: Family Medicine

## 2023-01-31 VITALS — BP 120/62 | HR 72 | Ht <= 58 in | Wt 180.0 lb

## 2023-01-31 DIAGNOSIS — I25118 Atherosclerotic heart disease of native coronary artery with other forms of angina pectoris: Secondary | ICD-10-CM | POA: Diagnosis not present

## 2023-01-31 DIAGNOSIS — R0982 Postnasal drip: Secondary | ICD-10-CM

## 2023-01-31 DIAGNOSIS — E785 Hyperlipidemia, unspecified: Secondary | ICD-10-CM | POA: Diagnosis not present

## 2023-01-31 DIAGNOSIS — R600 Localized edema: Secondary | ICD-10-CM

## 2023-01-31 NOTE — Progress Notes (Signed)
Cardiology Office Note:  .   Date:  01/31/2023  ID:  Laura Bush, DOB 05-Mar-1951, MRN [id number] PCP: Pearline Cables, MD  [facility] Providers Cardiologist:  Chilton Si, MD    History of Present Illness: .   Laura Bush is a 72 y.o. female nonobstructive coronary artery disease, hyperlipidemia, breast cancer s/p lumpectomy and XRT, GERD, hypertension.  Since 2017 with progressive exertional dyspnea.  Exercise Myoview negative for ischemia and LVEF 71%.  Metoprolol previously switched to carvedilol for blood pressure control.  Amlodipine discontinued due to lower extremity edema.  HCTZ increased due to poorly controlled blood pressure.  Summer 2023 she was diagnosed with stage IV lung cancer and is now in remission.  She was seen 12/21/2022 by Dr. Duke Salvia noting chest pain and exertional dyspnea.  She noted decreased exercise related to fractured femur requiring titanium rod placement and subsequent struggle with lymphedema.  He was no longer on Ozempic due to bruising.  Due to known coronary calcification on prior imaging simvastatin was transitioned to rosuvastatin.  Nifedipine was stopped due to concern was contributory to lymphedema and she was started on valsartan 80 mg daily.  Coronary CTA 12/29/2022 with coronary calcium score of 242 placing her in the 83rd percentile with overall minimal plaque in the LAD, LCx, OM1. Echo 01/18/2023 normal LVEF 60 to 65%, grade 1 diastolic dysfunction, no RWMA, no significant valvular abnormalities.  Presents today for follow-up with her husband. About a week ago had an episode of fatigue this week felt better. Notes her swelling is a little bit better. She is taking Lasix every other day. BP has been at goal <130/80 at home. No chest pain. Exertional dyspnea is stable and interested in increasing her physical activity.   ROS: Please see the history of present illness.    All other systems reviewed and are negative.    Studies Reviewed: .        Cardiac Studies & Procedures     STRESS TESTS  MYOCARDIAL PERFUSION IMAGING 05/11/2016  Narrative  The left ventricular ejection fraction is hyperdynamic (>65%).  Nuclear stress EF: 71%.  Blood pressure demonstrated a normal response to exercise.  There was no ST segment deviation noted during stress.  The study is normal.  Normal stress nuclear study with no ischemia or infarction; EF 71 with normal wall motion.   ECHOCARDIOGRAM  ECHOCARDIOGRAM COMPLETE 01/18/2023  Narrative ECHOCARDIOGRAM REPORT    Patient Name:   Laura Bush Date of Exam: 01/18/2023 Medical Rec #:  [id number]            Height:       48.0 in Accession #:    [id number]           Weight:       181.2 lb Date of Birth:  [birth year]            BSA:          1.523 m Patient Age:    71 years             BP:           130/60 mmHg Patient Gender: F                    HR:           62 bpm. Exam Location:  [place]  Procedure: 2D Echo, Cardiac Doppler, Color Doppler and Intracardiac Opacification Agent  Indications:    R06.9 DOE; R60.0  Lower extremity edema  History:        Patient has prior history of Echocardiogram examinations, most recent 03/03/2021. CAD, Signs/Symptoms:Dyspnea and Edema; Risk Factors:Hypertension, Dyslipidemia and Sleep Apnea. Patient denies chest pain. She does have DOE with bilateral leg edema. Patient undergoing chemotherapy for lung cancer.  Sonographer:    Carlos American RVT, RDCS (AE), RDMS Referring Phys: [phone] [facility] [place]   Sonographer Comments: Suboptimal apical window and patient is obese. Image acquisition challenging due to patient body habitus. IMPRESSIONS   1. Left ventricular ejection fraction, by estimation, is 60 to 65%. The left ventricle has normal function. The left ventricle has no regional wall motion abnormalities. Left ventricular diastolic parameters are consistent with Grade I diastolic dysfunction (impaired  relaxation). 2. Right ventricular systolic function is normal. The right ventricular size is moderately enlarged. 3. The mitral valve is normal in structure. No evidence of mitral valve regurgitation. No evidence of mitral stenosis. 4. The aortic valve is normal in structure. Aortic valve regurgitation is not visualized. No aortic stenosis is present. 5. The inferior vena cava is normal in size with greater than 50% respiratory variability, suggesting right atrial pressure of 3 mmHg.  Comparison(s): EF 60%, mild LVH.  FINDINGS Left Ventricle: Left ventricular ejection fraction, by estimation, is 60 to 65%. The left ventricle has normal function. The left ventricle has no regional wall motion abnormalities. Definity contrast agent was given IV to delineate the left ventricular endocardial borders. The left ventricular internal cavity size was normal in size. There is no left ventricular hypertrophy. Left ventricular diastolic parameters are consistent with Grade I diastolic dysfunction (impaired relaxation).  Right Ventricle: The right ventricular size is moderately enlarged. No increase in right ventricular wall thickness. Right ventricular systolic function is normal.  Left Atrium: Left atrial size was normal in size.  Right Atrium: Right atrial size was normal in size.  Pericardium: There is no evidence of pericardial effusion.  Mitral Valve: The mitral valve is normal in structure. No evidence of mitral valve regurgitation. No evidence of mitral valve stenosis.  Tricuspid Valve: The tricuspid valve is normal in structure. Tricuspid valve regurgitation is mild . No evidence of tricuspid stenosis.  Aortic Valve: The aortic valve is normal in structure. Aortic valve regurgitation is not visualized. No aortic stenosis is present. Aortic valve mean gradient measures 7.0 mmHg. Aortic valve peak gradient measures 15.1 mmHg. Aortic valve area, by VTI measures 2.10 cm.  Pulmonic Valve: The  pulmonic valve was normal in structure. Pulmonic valve regurgitation is not visualized. No evidence of pulmonic stenosis.  Aorta: The aortic root is normal in size and structure.  Venous: The inferior vena cava is normal in size with greater than 50% respiratory variability, suggesting right atrial pressure of 3 mmHg.  IAS/Shunts: No atrial level shunt detected by color flow Doppler.   LEFT VENTRICLE PLAX 2D LVIDd:         4.20 cm      Diastology LVIDs:         2.60 cm      LV e' medial:    7.07 cm/s LV PW:         1.60 cm      LV E/e' medial:  8.2 LV IVS:        1.20 cm      LV e' lateral:   7.72 cm/s LVOT diam:     1.80 cm      LV E/e' lateral: 7.5 LV SV:  67 LV SV Index:   44 LVOT Area:     2.54 cm  LV Volumes (MOD) LV vol d, MOD A2C: 82.6 ml LV vol d, MOD A4C: 116.0 ml LV vol s, MOD A2C: 22.8 ml LV vol s, MOD A4C: 38.8 ml LV SV MOD A2C:     59.8 ml LV SV MOD A4C:     116.0 ml LV SV MOD BP:      73.0 ml  RIGHT VENTRICLE RV S prime:     22.70 cm/s TAPSE (M-mode): 3.0 cm  LEFT ATRIUM             Index        RIGHT ATRIUM           Index LA diam:        4.00 cm 2.63 cm/m   RA Area:     12.60 cm LA Vol (A2C):   39.1 ml 25.68 ml/m  RA Volume:   33.00 ml  21.67 ml/m LA Vol (A4C):   63.7 ml 41.84 ml/m LA Biplane Vol: 53.4 ml 35.07 ml/m AORTIC VALVE                     PULMONIC VALVE AV Area (Vmax):    1.82 cm      PV Vmax:          1.39 m/s AV Area (Vmean):   1.84 cm      PV Peak grad:     7.7 mmHg AV Area (VTI):     2.10 cm      PR End Diast Vel: 1.75 msec AV Vmax:           194.00 cm/s AV Vmean:          121.000 cm/s AV VTI:            0.317 m AV Peak Grad:      15.1 mmHg AV Mean Grad:      7.0 mmHg LVOT Vmax:         139.00 cm/s LVOT Vmean:        87.600 cm/s LVOT VTI:          0.262 m LVOT/AV VTI ratio: 0.83  AORTA Ao Root diam: 2.70 cm Ao Asc diam:  3.20 cm Ao Arch diam: 2.5 cm  MITRAL VALVE               TRICUSPID VALVE MV Area (PHT): 2.75  cm    TR Peak grad:   20.8 mmHg MV Decel Time: 276 msec    TR Vmax:        228.00 cm/s MV E velocity: 58.00 cm/s MV A velocity: 88.60 cm/s  SHUNTS MV E/A ratio:  0.65        Systemic VTI:  0.26 m Systemic Diam: 1.80 cm  Belva Crome MD Electronically signed by Belva Crome MD Signature Date/Time: 01/18/2023/11:51:48 AM    Final     CT SCANS  CT CORONARY MORPH W/CTA COR W/SCORE 01/05/2023  Addendum 01/05/2023 11:12 AM ADDENDUM REPORT: 01/05/2023 11:09  EXAM: OVER-READ INTERPRETATION  CT CHEST  The following report is an over-read performed by radiologist Dr. Narda Rutherford of [facility] on 01/05/2023. This over-read does not include interpretation of cardiac or coronary anatomy or pathology. The coronary CTA interpretation by the cardiologist is attached.  COMPARISON:  Chest CT from PET 12/15/2022  FINDINGS: Vascular: No aortic atherosclerosis. The included aorta is normal in caliber.  Mediastinum/nodes: No adenopathy  or mass. Unremarkable esophagus.  Lungs: Stable area of consolidation in the left lower lobe from prior PET. Again seen 8 mm left lower lobe nodule series 11, image 35. No pleural fluid. The included airways are patent.  Upper abdomen: No acute or unexpected findings.  Musculoskeletal: There are no acute or suspicious osseous abnormalities. Surgical clips in the left breast partially included in the field of view.  IMPRESSION: 1. Stable area of left lower lobe consolidation from recent PET, favored on that exam to represent post treatment related change. 2. Stable left lower lobe pulmonary nodule measuring 8 mm, not hypermetabolic on recent PET. Continued oncologic follow-up recommended.   Electronically Signed By: Narda Rutherford M.D. On: 01/05/2023 11:09  Narrative CLINICAL DATA:  75F with with breast cancer s/p lumpectomy and XRT, hypertension, hyperlipidemia and chest pain.  EXAM: Cardiac/Coronary   CT  TECHNIQUE: The patient was scanned on a [employer].  FINDINGS: A 120 kV prospective scan was triggered in the descending thoracic aorta at 111 HU's. Axial non-contrast 3 mm slices were carried out through the heart. The data set was analyzed on a dedicated work station and scored using the Agatson method. Gantry rotation speed was 250 msecs and collimation was .6 mm. No beta blockade and 0.8 mg of sl NTG was given. The 3D data set was reconstructed in 5% intervals of the 67-82 % of the R-R cycle. Diastolic phases were analyzed on a dedicated work station using MPR, MIP and VRT modes. The patient received 80 cc of contrast.  Aorta: Normal size.  No calcifications.  No dissection.  Aortic Valve:  Trileaflet.  Mild calcification.  Coronary Arteries:  Normal coronary origin.  Left dominance.  RCA is a non-dominant artery.  There is no plaque.  Left main is a large artery that gives rise to LAD and LCX arteries.  LAD is a large vessel that has minimal (<25%) calcified plaque proximally.  LCX is a dominant artery with minimal (<25%) soft plaque distally. There is a small OM1 with minimal (<25%) calcified plaque.  Coronary Calcium Score:  Left main: 0  Left anterior descending artery: 242  Left circumflex artery: 0  Right coronary artery: 0  Total: 242  Percentile: 83rd  Other findings:  Normal pulmonary vein drainage into the left atrium.  Normal let atrial appendage without a thrombus.  Normal size of the pulmonary artery.  IMPRESSION: 1. Coronary calcium score of 242. This was 83rd percentile for age-, sex, and race-matched controls.  2. Total plaque volume 171 mm3 which is 41st percentile for age- and sex-matched controls (calcified plaque 51 mm3; non-calcified plaque 120 mm3). TPV is (moderate).  2. Normal coronary origin with left dominance.  3.  There is minimal plaque in the LAD, LCX and OM1.  CAD-RADS 1.  RECOMMENDATIONS: 1.  CAD-RADS 0: No evidence of CAD (0%). Consider non-atherosclerotic causes of chest pain.  2. CAD-RADS 1: Minimal non-obstructive CAD (0-24%). Consider non-atherosclerotic causes of chest pain. Consider preventive therapy and risk factor modification.  3. CAD-RADS 2: Mild non-obstructive CAD (25-49%). Consider non-atherosclerotic causes of chest pain. Consider preventive therapy and risk factor modification.  4. CAD-RADS 3: Moderate stenosis. Consider symptom-guided anti-ischemic pharmacotherapy as well as risk factor modification per guideline directed care. Additional analysis with CT FFR will be submitted.  5. CAD-RADS 4: Severe stenosis. (70-99% or > 50% left main). Cardiac catheterization or CT FFR is recommended. Consider symptom-guided anti-ischemic pharmacotherapy as well as risk factor modification per guideline directed care. Invasive  coronary angiography recommended with revascularization per published guideline statements.  6. CAD-RADS 5: Total coronary occlusion (100%). Consider cardiac catheterization or viability assessment. Consider symptom-guided anti-ischemic pharmacotherapy as well as risk factor modification per guideline directed care.  7. CAD-RADS N: Non-diagnostic study. Obstructive CAD can't be excluded. Alternative evaluation is recommended.  Chilton Si, MD  Electronically Signed: By: Chilton Si M.D. On: 12/29/2022 16:11          Risk Assessment/Calculations:             Physical Exam:   VS:  BP 120/62   Pulse 72   Ht 4\' 10"  (1.473 m)   Wt 180 lb (81.6 kg)   BMI 37.62 kg/m    Wt Readings from Last 3 Encounters:  01/31/23 180 lb (81.6 kg)  01/04/23 181 lb 3.2 oz (82.2 kg)  12/29/22 184 lb 1.9 oz (83.5 kg)    GEN: Well nourished, well developed in no acute distress NECK: No JVD; No carotid bruits CARDIAC: RRR, no murmurs, rubs, gallops RESPIRATORY:  Clear to auscultation without rales, wheezing or rhonchi  ABDOMEN: Soft,  non-tender, non-distended EXTREMITIES:  Nonpitting bilateral LE edema; No deformity,   ASSESSMENT AND PLAN: .    CAD/HLD, LDL goal less than 70 -coronary CTA 12/29/2022 with minimal nonobstructive coronary artery disease.  GDMT aspirin, rosuvastatin.  Repeat lipid panel, CMP upcoming in 2 to 3 months to reassess LDL on rosuvastatin.  Stable with no anginal symptoms. No indication for ischemic evaluation.   Refer to PREP exercise program.  Hypertension - BP well controlled. Continue current antihypertensive regimen.    Lower extremity edema -LE edema improved since transition from nifedipine to valsartan.  Of note could resume utilization of nifedipine in the winter months if needed for control of Raynaud's.  Echo 12/2022 normal LVEF with no evidence of heart failure.  Likely related to lymphedema, venous insufficiency.  Leg elevation, compression stockings, low-sodium diet encouraged.       Dispo: As scheduled with Dr. Duke Salvia 06/2023  Signed, Alver Sorrow, NP

## 2023-01-31 NOTE — Patient Instructions (Addendum)
Medication Instructions:  Your physician recommends that you continue on your current medications as directed. Please refer to the Current Medication list given to you today.  *If you need a refill on your cardiac medications before your next appointment, please call your pharmacy*   Lab Work: Fasting labs 2-3 months after starting cholesterol medications   Follow-Up: At [facility], you and your health needs are our priority.  As part of our continuing mission to provide you with exceptional heart care, we have created designated Provider Care Teams.  These Care Teams include your primary Cardiologist (physician) and Advanced Practice Providers (APPs -  Physician Assistants and Nurse Practitioners) who all work together to provide you with the care you need, when you need it.  We recommend signing up for the patient portal called "MyChart".  Sign up information is provided on this After Visit Summary.  MyChart is used to connect with patients for Virtual Visits (Telemedicine).  Patients are able to view lab/test results, encounter notes, upcoming appointments, etc.  Non-urgent messages can be sent to your provider as well.   To learn more about what you can do with MyChart, go to [email or web address].    Your next appointment:   Follow up as scheduled   Other Instructions For coronary artery disease often called "heart disease" we aim for optimal guideline directed medical therapy. We use the "A, B, C"s to help keep [place] on track!  A = Aspirin 81mg  daily B = Blood pressure control to goal of <130/80 C = Cholesterol control. You take Rosuvastatin to help control your cholesterol.  D = Diet - low sodium, heart healthy diet.  E = Exercise - gradually increase to 150 minutes per week.      Provider Referral Exercise Program (P.R.E.P.)      12 Weeks to Wellness       What is included in the PREP?  --Health Coaching and personalized exercise prescription --Full Membership to  participating [facility] for the 12 weeks --Pre- and Post-consultations to assess progress and formulate an exercise plan for       continuation of exercise   What is my investment?  Your cost is $100 (reg. $144). This includes full membership privileges to [facility] in [place] and across the country. If you complete the post-assessment visit, any applicable enrollment costs will be waived if you decide to join the [facility].   Who will benefit from PREP?  People with challenges, including (but not limited to): ---Low back pain ---Arthritis ---Hypertension ---Diabetes ---Obesity ---Joint Replacement ---Neuromuscular Disorders ---Cancer Recovery ---Weight Loss ---Many others (as determined by provider)   Get Started Today! Ask your healthcare provider to fill out a Healthcare Provider Referral for Exercise form. Be sure to turn it in before you leave the office and send/fax/email it directly to the Wellness RN to schedule your Initial Consultation. If you have family or friends that would also benefit, please share this referral with them.   Contacts:  [facility] [phone]    Viann Fish, Wellness RN  [phone]  [email or web address]

## 2023-02-01 DIAGNOSIS — H00014 Hordeolum externum left upper eyelid: Secondary | ICD-10-CM | POA: Diagnosis not present

## 2023-02-01 DIAGNOSIS — L821 Other seborrheic keratosis: Secondary | ICD-10-CM | POA: Diagnosis not present

## 2023-02-01 DIAGNOSIS — B351 Tinea unguium: Secondary | ICD-10-CM | POA: Diagnosis not present

## 2023-02-03 ENCOUNTER — Telehealth: Payer: Self-pay

## 2023-02-03 NOTE — Telephone Encounter (Signed)
Called [id number] program referral, would like to attend [place] in August, will contact mid August to confirm and set up assessment visit.

## 2023-02-04 DIAGNOSIS — G4733 Obstructive sleep apnea (adult) (pediatric): Secondary | ICD-10-CM | POA: Diagnosis not present

## 2023-02-07 DIAGNOSIS — R69 Illness, unspecified: Secondary | ICD-10-CM | POA: Diagnosis not present

## 2023-02-08 NOTE — Progress Notes (Signed)
  TeleHealth Visit:  This visit was completed with telemedicine (audio/video) technology. Laura Bush has verbally consented to this TeleHealth visit. The patient is located at home, the provider is located at home. The participants in this visit include the listed provider and patient. The visit was conducted today via MyChart video.  OBESITY Laura Bush is here to discuss her progress with her obesity treatment plan along with follow-up of her obesity related diagnoses.    Today's visit was # 31  Starting weight: 196 lbs Starting date: 10/17/2019 Weight reported at last virtual office visit: high 170s lbs on 12/19/22 Today's reported weight (02/13/23):  177 lbs Total weight loss: 19 lbs  Nutrition Plan: practicing portion control and making smarter food choices, such as increasing vegetables and decreasing simple carbohydrates and Other 80 gms protein daily    Current exercise:  yoga once weekly  Interim History:  Dealing with constant fatigue. She sleeps well and is able to do activities but only one major activity per day. She is currently being treated for stage 4 lung cancer.  She was referred to the PREP exercise program by cardiology and will be starting that. She is taking a Lasix every other day for lower extremity edema which has been very helpful. Protein intake is good. She is focusing on having protein 3 at all meals. Water, fruit,veggie intake is good.   Assessment/Plan:  1. Insulin Resistance Last fasting insulin was 12.6. A1c was 5.5. She is happy with how well the metformin is controlling her appetite and cravings. Medication(s): Metformin 500 mg 2 tabs at lunch. Tried to take 1500 mg/day but had GI side effects.  Lab Results  Component Value Date   HGBA1C 5.5 10/26/2022   HGBA1C 5.3 01/03/2022   HGBA1C 5.2 04/06/2020   HGBA1C 5.6 04/03/2019   HGBA1C 5.6 03/28/2018   Lab Results  Component Value Date   INSULIN 12.6 10/17/2019    Plan: Continue metformin  1000 mg at lunch.   2. Generalized Obesity: Current BMI 37 Laura Bush is currently in the action stage of change. As such, her goal is to continue with weight loss efforts.  She has agreed to practicing portion control and making smarter food choices, such as increasing vegetables and decreasing simple carbohydrates.  1.  Continue to have protein at every meal.  Exercise goals: as is  Behavioral modification strategies: increasing lean protein intake and planning for success.  Laura Bush has agreed to follow-up with our clinic in 8 weeks.  No orders of the defined types were placed in this encounter.   There are no discontinued medications.   No orders of the defined types were placed in this encounter.     Objective:   VITALS: Per patient if applicable, see vitals. GENERAL: Alert and in no acute distress. CARDIOPULMONARY: No increased WOB. Speaking in clear sentences.  PSYCH: Pleasant and cooperative. Speech normal rate and rhythm. Affect is appropriate. Insight and judgement are appropriate. Attention is focused, linear, and appropriate.  NEURO: Oriented as arrived to appointment on time with no prompting.   Attestation Statements:   Reviewed by clinician on day of visit: allergies, medications, problem list, medical history, surgical history, family history, social history, and previous encounter notes.  This was prepared with the assistance of [employer].  Occasional wrong-word or sound-a-like substitutions may have occurred due to the inherent limitations of voice recognition software.

## 2023-02-13 ENCOUNTER — Encounter (INDEPENDENT_AMBULATORY_CARE_PROVIDER_SITE_OTHER): Payer: Self-pay | Admitting: Family Medicine

## 2023-02-13 ENCOUNTER — Telehealth (INDEPENDENT_AMBULATORY_CARE_PROVIDER_SITE_OTHER): Payer: Medicare HMO | Admitting: Family Medicine

## 2023-02-13 VITALS — Ht <= 58 in | Wt 177.0 lb

## 2023-02-13 DIAGNOSIS — Z6837 Body mass index (BMI) 37.0-37.9, adult: Secondary | ICD-10-CM | POA: Diagnosis not present

## 2023-02-13 DIAGNOSIS — E669 Obesity, unspecified: Secondary | ICD-10-CM

## 2023-02-13 DIAGNOSIS — E88819 Insulin resistance, unspecified: Secondary | ICD-10-CM | POA: Diagnosis not present

## 2023-02-15 ENCOUNTER — Encounter: Payer: Self-pay | Admitting: Hematology & Oncology

## 2023-02-15 ENCOUNTER — Encounter: Payer: Self-pay | Admitting: *Deleted

## 2023-02-15 ENCOUNTER — Ambulatory Visit (HOSPITAL_BASED_OUTPATIENT_CLINIC_OR_DEPARTMENT_OTHER)
Admission: RE | Admit: 2023-02-15 | Discharge: 2023-02-15 | Disposition: A | Discharge status: Home / Self Care | Payer: Medicare HMO | Source: Ambulatory Visit | Attending: Hematology & Oncology | Admitting: Hematology & Oncology

## 2023-02-15 ENCOUNTER — Inpatient Hospital Stay: Payer: Medicare HMO | Admitting: Hematology & Oncology

## 2023-02-15 ENCOUNTER — Inpatient Hospital Stay: Payer: Medicare HMO | Attending: Hematology & Oncology

## 2023-02-15 ENCOUNTER — Inpatient Hospital Stay: Payer: Medicare HMO

## 2023-02-15 VITALS — BP 117/53 | HR 61 | Temp 98.2°F | Resp 20 | Ht <= 58 in | Wt 179.1 lb

## 2023-02-15 DIAGNOSIS — C7951 Secondary malignant neoplasm of bone: Secondary | ICD-10-CM | POA: Insufficient documentation

## 2023-02-15 DIAGNOSIS — C3490 Malignant neoplasm of unspecified part of unspecified bronchus or lung: Secondary | ICD-10-CM | POA: Insufficient documentation

## 2023-02-15 DIAGNOSIS — K8020 Calculus of gallbladder without cholecystitis without obstruction: Secondary | ICD-10-CM | POA: Diagnosis not present

## 2023-02-15 DIAGNOSIS — R7989 Other specified abnormal findings of blood chemistry: Secondary | ICD-10-CM | POA: Diagnosis not present

## 2023-02-15 DIAGNOSIS — C3432 Malignant neoplasm of lower lobe, left bronchus or lung: Secondary | ICD-10-CM | POA: Insufficient documentation

## 2023-02-15 LAB — CMP (CANCER CENTER ONLY)
ALT: 82 U/L — ABNORMAL HIGH (ref 0–44)
AST: 67 U/L — ABNORMAL HIGH (ref 15–41)
Albumin: 4.4 g/dL (ref 3.5–5.0)
Alkaline Phosphatase: 76 U/L (ref 38–126)
Anion gap: 7 (ref 5–15)
BUN: 28 mg/dL — ABNORMAL HIGH (ref 8–23)
CO2: 30 mmol/L (ref 22–32)
Calcium: 9.9 mg/dL (ref 8.9–10.3)
Chloride: 100 mmol/L (ref 98–111)
Creatinine: 1.29 mg/dL — ABNORMAL HIGH (ref 0.44–1.00)
GFR, Estimated: 44 mL/min — ABNORMAL LOW (ref >60)
Glucose, Bld: 96 mg/dL (ref 70–99)
Potassium: 4 mmol/L (ref 3.5–5.1)
Sodium: 137 mmol/L (ref 135–145)
Total Bilirubin: 0.9 mg/dL (ref 0.3–1.2)
Total Protein: 7 g/dL (ref 6.5–8.1)

## 2023-02-15 LAB — CBC WITH DIFFERENTIAL (CANCER CENTER ONLY)
Abs Immature Granulocytes: 0.05 10*3/uL (ref 0.00–0.07)
Basophils Absolute: 0 10*3/uL (ref 0.0–0.1)
Basophils Relative: 1 %
Eosinophils Absolute: 0.1 10*3/uL (ref 0.0–0.5)
Eosinophils Relative: 2 %
HCT: 29.8 % — ABNORMAL LOW (ref 36.0–46.0)
Hemoglobin: 10.4 g/dL — ABNORMAL LOW (ref 12.0–15.0)
Immature Granulocytes: 1 %
Lymphocytes Relative: 29 %
Lymphs Abs: 1.3 10*3/uL (ref 0.7–4.0)
MCH: 35.6 pg — ABNORMAL HIGH (ref 26.0–34.0)
MCHC: 34.9 g/dL (ref 30.0–36.0)
MCV: 102.1 fL — ABNORMAL HIGH (ref 80.0–100.0)
Monocytes Absolute: 0.5 10*3/uL (ref 0.1–1.0)
Monocytes Relative: 10 %
Neutro Abs: 2.7 10*3/uL (ref 1.7–7.7)
Neutrophils Relative %: 57 %
Platelet Count: 156 10*3/uL (ref 150–400)
RBC: 2.92 MIL/uL — ABNORMAL LOW (ref 3.87–5.11)
RDW: 14.4 % (ref 11.5–15.5)
WBC Count: 4.7 10*3/uL (ref 4.0–10.5)
nRBC: 0 % (ref 0.0–0.2)

## 2023-02-15 LAB — CEA (IN HOUSE-CHCC): CEA (CHCC-In House): 18.26 ng/mL — ABNORMAL HIGH (ref 0.00–5.00)

## 2023-02-15 MED ORDER — DENOSUMAB 120 MG/1.7ML ~~LOC~~ SOLN
120.0000 mg | Freq: Once | SUBCUTANEOUS | Status: AC
  Administered 2023-02-15: 120 mg via SUBCUTANEOUS
  Filled 2023-02-15: qty 1.7

## 2023-02-15 NOTE — Progress Notes (Signed)
Hematology and Oncology Follow Up Visit  Laura Bush [id number] 26-Apr-1951 72 y.o. 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 3 months  -next dose in 02/2023  S/P SBRT to LUL -- completed on 03/04/2022 S/P XRT to RIGHT femur -- completed on 03/04/2022 Alecensa 600 mg po BID -- start on 03/24/2022     Interim History:  [name] is back for follow-up.  She feels well.  She really has no specific complaints.  There is still little bit of swelling in the legs.  She does take Lasix on occasion.  Heart with her labs today, noted that her LFTs are slowly going up.  I told her to stop the Crestor.  She was put on Crestor about 4 weeks ago.  I would like to get an ultrasound of her liver to see what is going on.  They are going out of town this afternoon for a week or so.  They are we have been babysitting some grandchildren.  I would like to try to get the ultrasound before she goes out of town.  She has had no nausea or vomiting.  There is been no change in bowel or bladder habits.  She has had no fever.  She has had no bleeding.  There is been no headache.  She does feel little bit fatigued.  She has a little bit more anemia.  When we see her back, we will have to check her iron studies.  Overall, I would say that her performance status is probably ECOG 1.  .    Medications:  Current Outpatient Medications:    acetaminophen (TYLENOL) 500 MG tablet, Take 1 tablet (500 mg total) by mouth every 8 (eight) hours as needed for mild pain or moderate pain., Disp: 30 tablet, Rfl: 0   alectinib (ALECENSA) 150 MG capsule, Take 4 capsules (600 mg total) by mouth 2 (two) times daily with a meal., Disp: 240 capsule, Rfl: 6   aMILoride (MIDAMOR) 5 MG tablet, TAKE ONE TABLET BY MOUTH DAILY, Disp: 90 tablet, Rfl: 3    aspirin EC 81 MG tablet, Take 81 mg by mouth daily. Swallow whole., Disp: , Rfl:    B Complex-C (SUPER B COMPLEX PO), Take 1 tablet by mouth daily with breakfast., Disp: , Rfl:    Calcium Carb-Cholecalciferol (CALCIUM + D3 PO), Take 1 tablet by mouth daily., Disp: , Rfl:    cholecalciferol (VITAMIN D3) 25 MCG (1000 UNIT) tablet, Take 1,000 Units by mouth daily., Disp: , Rfl:    fexofenadine (ALLEGRA) 180 MG tablet, Take 180 mg by mouth in the morning., Disp: , Rfl:    folic acid (FOLATE) 400 MCG tablet, Take 400 mcg by mouth daily., Disp: , Rfl:    furosemide (LASIX) 20 MG tablet, Please take 1 tablet daily as needed for leg swelling., Disp: 30 tablet, Rfl: 3   hydrochlorothiazide (HYDRODIURIL) 25 MG tablet, Take 1 tablet (25 mg total) by mouth daily., Disp: 90 tablet, Rfl: 1   ipratropium (ATROVENT) 0.06 % nasal spray, Place 2 sprays into both nostrils 4 (four) times daily as needed for rhinitis., Disp: 15 mL, Rfl: 4   metFORMIN (GLUCOPHAGE) 500 MG tablet, TAKE 2 TABLETS BY MOUTH DAILY WITH LUNCH (Patient taking differently: Take 1,000 mg by mouth daily with lunch. TAKE 2 TABLETS BY MOUTH  DAILY WITH LUNCH), Disp: 180 tablet, Rfl: 0   pantoprazole (PROTONIX) 40 MG tablet, TAKE ONE TABLET BY MOUTH DAILY (Patient taking differently: Take 40 mg by mouth every other day.), Disp: 90 tablet, Rfl: 3   polyethylene glycol (MIRALAX / GLYCOLAX) 17 g packet, Take 17 g by mouth daily., Disp: , Rfl:    rosuvastatin (CRESTOR) 20 MG tablet, Take 1 tablet (20 mg total) by mouth daily., Disp: 90 tablet, Rfl: 3   traZODone (DESYREL) 50 MG tablet, TAKE ONE TABLET BY MOUTH EVERY NIGHT AT BEDTIME AS NEEDED FOR SLEEP, Disp: 30 tablet, Rfl: 3   valsartan (DIOVAN) 80 MG tablet, Take 1 tablet (80 mg total) by mouth daily., Disp: 90 tablet, Rfl: 3   diphenhydrAMINE (BENADRYL) 25 mg capsule, Take 25 mg by mouth at bedtime as needed. (Patient not taking: Reported on 02/15/2023), Disp: , Rfl:   Allergies:  Allergies  Allergen  Reactions   Diclofenac Hypertension   Amlodipine Swelling and Other (See Comments)    Limbs swell, not the throat   Lanolin Other (See Comments)    Sneezing and watery eyes. Allergic to [facility].   Tape Other (See Comments)    Redness (Bandaids also)    Past Medical History, Surgical history, Social history, and Family History were reviewed and updated.  Review of Systems: Review of Systems  Constitutional: Negative.   HENT:  Negative.    Eyes: Negative.   Respiratory: Negative.    Cardiovascular: Negative.   Gastrointestinal: Negative.   Endocrine: Negative.   Genitourinary: Negative.    Musculoskeletal:  Positive for arthralgias.  Skin: Negative.   Neurological: Negative.   Hematological: Negative.   Psychiatric/Behavioral: Negative.      Physical Exam:  height is 4\' 10"  (1.473 m) and weight is 179 lb 1.9 oz (81.2 kg). Her oral temperature is 98.2 F (36.8 C). Her blood pressure is 117/53 (abnormal) and her pulse is 61. Her respiration is 20 and oxygen saturation is 98%.   Wt Readings from Last 3 Encounters:  02/15/23 179 lb 1.9 oz (81.2 kg)  02/13/23 177 lb (80.3 kg)  01/31/23 180 lb (81.6 kg)    Physical Exam Vitals reviewed.  HENT:     Head: Normocephalic and atraumatic.  Eyes:     Pupils: Pupils are equal, round, and reactive to light.  Cardiovascular:     Rate and Rhythm: Normal rate and regular rhythm.     Heart sounds: Normal heart sounds.  Pulmonary:     Effort: Pulmonary effort is normal.     Breath sounds: Normal breath sounds.  Abdominal:     General: Bowel sounds are normal.     Palpations: Abdomen is soft.  Musculoskeletal:        General: No tenderness or deformity. Normal range of motion.     Cervical back: Normal range of motion.     Comments: She has about 2+ edema in the lower legs.  I cannot palpate any venous cord.  She has a negative Homans' sign.  Lymphadenopathy:     Cervical: No cervical adenopathy.  Skin:    General: Skin is warm and  dry.     Findings: No erythema or rash.  Neurological:     Mental Status: She is alert and oriented to person, place, and time.  Psychiatric:        Behavior: Behavior normal.        Thought Content: Thought content normal.        Judgment: Judgment normal.  Lab Results  Component Value Date   WBC 4.7 02/15/2023   HGB 10.4 (L) 02/15/2023   HCT 29.8 (L) 02/15/2023   MCV 102.1 (H) 02/15/2023   PLT 156 02/15/2023     Chemistry      Component Value Date/Time   NA 137 02/15/2023 0842   NA 141 12/27/2022 0812   NA 142 05/29/2017 1002   K 4.0 02/15/2023 0842   K 3.9 05/29/2017 1002   CL 100 02/15/2023 0842   CO2 30 02/15/2023 0842   CO2 28 05/29/2017 1002   BUN 28 (H) 02/15/2023 0842   BUN 28 (H) 12/27/2022 0812   BUN 13.5 05/29/2017 1002   CREATININE 1.29 (H) 02/15/2023 0842   CREATININE 0.81 04/06/2020 1059   CREATININE 0.8 05/29/2017 1002      Component Value Date/Time   CALCIUM 9.9 02/15/2023 0842   CALCIUM 9.4 05/29/2017 1002   ALKPHOS 76 02/15/2023 0842   ALKPHOS 39 (L) 05/29/2017 1002   AST 67 (H) 02/15/2023 0842   AST 18 05/29/2017 1002   ALT 82 (H) 02/15/2023 0842   ALT 19 05/29/2017 1002   BILITOT 0.9 02/15/2023 0842   BILITOT 0.48 05/29/2017 1002       Impression and Plan: [name] is a very charming 72 year old white female.  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  This was actually a small lesion.  She is on the [place].  She has been on Alecensa for almost a year.  We really have to get another PET scan on her.  I will set this up in August.  Hopefully, we will be able to get the ultrasound today.  I would like to see her back after she has a PET scan done in August.  Again I told her to stop the Crestor that she is taking right now.    Josph Macho, MD 7/10/202410:02 AM

## 2023-02-15 NOTE — Patient Instructions (Signed)
Denosumab Injection (Oncology) What is this medication? DENOSUMAB (den oh SUE mab) prevents weakened bones caused by cancer. It may also be used to treat noncancerous bone tumors that cannot be removed by surgery. It can also be used to treat high calcium levels in the blood caused by cancer. It works by blocking a protein that causes bones to break down quickly. This slows down the release of calcium from bones, which lowers calcium levels in your blood. It also makes your bones stronger and less likely to break (fracture). This medicine may be used for other purposes; ask your health care provider or pharmacist if you have questions. COMMON BRAND NAME(S): XGEVA What should I tell my care team before I take this medication? They need to know if you have any of these conditions: Dental disease Having surgery or tooth extraction Infection Kidney disease Low levels of calcium or vitamin D in the blood Malnutrition On hemodialysis Skin conditions or sensitivity Thyroid or parathyroid disease An unusual reaction to denosumab, other medications, foods, dyes, or preservatives Pregnant or trying to get pregnant Breast-feeding How should I use this medication? This medication is for injection under the skin. It is given by your care team in a hospital or clinic setting. A special MedGuide will be given to you before each treatment. Be sure to read this information carefully each time. Talk to your care team about the use of this medication in children. While it may be prescribed for children as young as 13 years for selected conditions, precautions do apply. Overdosage: If you think you have taken too much of this medicine contact a poison control center or emergency room at once. NOTE: This medicine is only for you. Do not share this medicine with others. What if I miss a dose? Keep appointments for follow-up doses. It is important not to miss your dose. Call your care team if you are unable to  keep an appointment. What may interact with this medication? Do not take this medication with any of the following: Other medications containing denosumab This medication may also interact with the following: Medications that lower your chance of fighting infection Steroid medications, such as prednisone or cortisone This list may not describe all possible interactions. Give your health care provider a list of all the medicines, herbs, non-prescription drugs, or dietary supplements you use. Also tell them if you smoke, drink alcohol, or use illegal drugs. Some items may interact with your medicine. What should I watch for while using this medication? Your condition will be monitored carefully while you are receiving this medication. You may need blood work while taking this medication. This medication may increase your risk of getting an infection. Call your care team for advice if you get a fever, chills, sore throat, or other symptoms of a cold or flu. Do not treat yourself. Try to avoid being around people who are sick. You should make sure you get enough calcium and vitamin D while you are taking this medication, unless your care team tells you not to. Discuss the foods you eat and the vitamins you take with your care team. Some people who take this medication have severe bone, joint, or muscle pain. This medication may also increase your risk for jaw problems or a broken thigh bone. Tell your care team right away if you have severe pain in your jaw, bones, joints, or muscles. Tell your care team if you have any pain that does not go away or that gets worse. Talk   to your care team if you may be pregnant. Serious birth defects can occur if you take this medication during pregnancy and for 5 months after the last dose. You will need a negative pregnancy test before starting this medication. Contraception is recommended while taking this medication and for 5 months after the last dose. Your care team  can help you find the option that works for you. What side effects may I notice from receiving this medication? Side effects that you should report to your care team as soon as possible: Allergic reactions--skin rash, itching, hives, swelling of the face, lips, tongue, or throat Bone, joint, or muscle pain Low calcium level--muscle pain or cramps, confusion, tingling, or numbness in the hands or feet Osteonecrosis of the jaw--pain, swelling, or redness in the mouth, numbness of the jaw, poor healing after dental work, unusual discharge from the mouth, visible bones in the mouth Side effects that usually do not require medical attention (report to your care team if they continue or are bothersome): Cough Diarrhea Fatigue Headache Nausea This list may not describe all possible side effects. Call your doctor for medical advice about side effects. You may report side effects to FDA at 1-800-FDA-1088. Where should I keep my medication? This medication is given in a hospital or clinic. It will not be stored at home. NOTE: This sheet is a summary. It may not cover all possible information. If you have questions about this medicine, talk to your doctor, pharmacist, or health care provider.  2024 Elsevier/Gold Standard (2021-12-15 00:00:00)  

## 2023-02-15 NOTE — Progress Notes (Signed)
Patient is doing well today. She is tolerating her treatment. Her LFTs are elevated and an [place] was performed. No evidence of malignancy. Dr Myna Hidalgo made med adjustments.  Patient will need a PET scan prior to her next appointment.   Oncology Nurse Navigator Documentation     02/15/2023    9:00 AM  Oncology Nurse Navigator Flowsheets  Navigator Follow Up Date: 03/30/2023  Navigator Follow Up Reason: Scan Review  Navigator Location [facility]  Navigator Encounter Type Follow-up Appt;Appt/Treatment Plan Review;MyChart  Patient Visit Type MedOnc  Treatment Phase Active Tx  Barriers/Navigation Needs No Barriers At This Time  Education Other  Interventions Coordination of Care;Education  Acuity Level 1-No Barriers  Coordination of Care Radiology  Education Method Written  Support Groups/Services Friends and Family  Time Spent with Patient 30

## 2023-02-16 ENCOUNTER — Other Ambulatory Visit: Payer: Self-pay

## 2023-02-16 ENCOUNTER — Telehealth: Payer: Self-pay

## 2023-02-16 DIAGNOSIS — C7951 Secondary malignant neoplasm of bone: Secondary | ICD-10-CM

## 2023-02-16 NOTE — Telephone Encounter (Signed)
Advised via MyChart.

## 2023-02-16 NOTE — Telephone Encounter (Signed)
-----   Message from Laura Bush sent at 02/15/2023  3:31 PM EDT ----- Please call and let her know that she does have some small gallstones.  This is really not a problem.  She does have some liver infiltration by fat.  Again, this might explain the increase LFTs.  However, I would still stop the Crestor.  Please make sure that she comes back in 3-4 weeks to have another CMET drawn.  I have not ordered this yet.  Thanks.Marland Kitchen

## 2023-02-26 ENCOUNTER — Other Ambulatory Visit: Payer: Self-pay | Admitting: Family Medicine

## 2023-02-27 ENCOUNTER — Encounter (INDEPENDENT_AMBULATORY_CARE_PROVIDER_SITE_OTHER): Payer: Medicare HMO | Admitting: Family Medicine

## 2023-02-27 DIAGNOSIS — U071 COVID-19: Secondary | ICD-10-CM | POA: Diagnosis not present

## 2023-02-27 MED ORDER — NIRMATRELVIR/RITONAVIR (PAXLOVID) TABLET (RENAL DOSING)
2.0000 | ORAL_TABLET | Freq: Two times a day (BID) | ORAL | 0 refills | Status: AC
Start: 2023-02-27 — End: 2023-03-04

## 2023-02-27 NOTE — Telephone Encounter (Signed)
Please see the MyChart message reply(ies) for my assessment and plan.  The patient gave consent for this Medical Advice Message and is aware that it may result in a bill to their insurance company as well as the possibility that this may result in a co-payment or deductible. They are an established patient, but are not seeking medical advice exclusively about a problem treated during an in person or video visit in the last 7 days. I did not recommend an in person or video visit within 7 days of my reply.  I spent a total of 12 minutes cumulative time within 7 days through [employer] messaging Lamar Blinks, MD

## 2023-02-28 ENCOUNTER — Encounter: Payer: Self-pay | Admitting: *Deleted

## 2023-03-03 ENCOUNTER — Other Ambulatory Visit: Payer: Self-pay | Admitting: *Deleted

## 2023-03-03 DIAGNOSIS — C50812 Malignant neoplasm of overlapping sites of left female breast: Secondary | ICD-10-CM

## 2023-03-03 DIAGNOSIS — Z7189 Other specified counseling: Secondary | ICD-10-CM

## 2023-03-03 DIAGNOSIS — C7951 Secondary malignant neoplasm of bone: Secondary | ICD-10-CM

## 2023-03-03 DIAGNOSIS — C3490 Malignant neoplasm of unspecified part of unspecified bronchus or lung: Secondary | ICD-10-CM

## 2023-03-06 ENCOUNTER — Inpatient Hospital Stay: Payer: Medicare HMO

## 2023-03-06 ENCOUNTER — Encounter (HOSPITAL_BASED_OUTPATIENT_CLINIC_OR_DEPARTMENT_OTHER): Payer: Self-pay | Admitting: Cardiovascular Disease

## 2023-03-06 DIAGNOSIS — C7951 Secondary malignant neoplasm of bone: Secondary | ICD-10-CM | POA: Diagnosis not present

## 2023-03-06 DIAGNOSIS — C3490 Malignant neoplasm of unspecified part of unspecified bronchus or lung: Secondary | ICD-10-CM

## 2023-03-06 DIAGNOSIS — I25118 Atherosclerotic heart disease of native coronary artery with other forms of angina pectoris: Secondary | ICD-10-CM

## 2023-03-06 DIAGNOSIS — G4733 Obstructive sleep apnea (adult) (pediatric): Secondary | ICD-10-CM | POA: Diagnosis not present

## 2023-03-06 DIAGNOSIS — E785 Hyperlipidemia, unspecified: Secondary | ICD-10-CM

## 2023-03-06 DIAGNOSIS — Z7189 Other specified counseling: Secondary | ICD-10-CM

## 2023-03-06 DIAGNOSIS — C3432 Malignant neoplasm of lower lobe, left bronchus or lung: Secondary | ICD-10-CM | POA: Diagnosis not present

## 2023-03-06 DIAGNOSIS — Z170 Estrogen receptor positive status [ER+]: Secondary | ICD-10-CM

## 2023-03-06 LAB — CMP (CANCER CENTER ONLY)
ALT: 33 U/L (ref 0–44)
AST: 36 U/L (ref 15–41)
Albumin: 3.7 g/dL (ref 3.5–5.0)
Alkaline Phosphatase: 80 U/L (ref 38–126)
Anion gap: 12 (ref 5–15)
BUN: 37 mg/dL — ABNORMAL HIGH (ref 8–23)
CO2: 27 mmol/L (ref 22–32)
Calcium: 9 mg/dL (ref 8.9–10.3)
Chloride: 97 mmol/L — ABNORMAL LOW (ref 98–111)
Creatinine: 1.65 mg/dL — ABNORMAL HIGH (ref 0.44–1.00)
GFR, Estimated: 33 mL/min — ABNORMAL LOW (ref >60)
Glucose, Bld: 97 mg/dL (ref 70–99)
Potassium: 4.7 mmol/L (ref 3.5–5.1)
Sodium: 136 mmol/L (ref 135–145)
Total Bilirubin: 0.7 mg/dL (ref 0.3–1.2)
Total Protein: 6.9 g/dL (ref 6.5–8.1)

## 2023-03-07 ENCOUNTER — Encounter: Payer: Self-pay | Admitting: Hematology & Oncology

## 2023-03-07 MED ORDER — PRAVASTATIN SODIUM 20 MG PO TABS
20.0000 mg | ORAL_TABLET | ORAL | 3 refills | Status: DC
Start: 2023-03-08 — End: 2023-07-28

## 2023-03-07 NOTE — Telephone Encounter (Signed)
Please advise on how to proceed with cholesterol management

## 2023-03-16 ENCOUNTER — Other Ambulatory Visit (INDEPENDENT_AMBULATORY_CARE_PROVIDER_SITE_OTHER): Payer: Self-pay | Admitting: Family Medicine

## 2023-03-16 DIAGNOSIS — E88819 Insulin resistance, unspecified: Secondary | ICD-10-CM

## 2023-03-20 DIAGNOSIS — M61552 Other ossification of muscle, left thigh: Secondary | ICD-10-CM | POA: Diagnosis not present

## 2023-03-20 DIAGNOSIS — T8484XA Pain due to internal orthopedic prosthetic devices, implants and grafts, initial encounter: Secondary | ICD-10-CM | POA: Diagnosis not present

## 2023-03-20 DIAGNOSIS — S72301D Unspecified fracture of shaft of right femur, subsequent encounter for closed fracture with routine healing: Secondary | ICD-10-CM | POA: Diagnosis not present

## 2023-03-24 ENCOUNTER — Telehealth: Payer: Self-pay

## 2023-03-24 NOTE — Telephone Encounter (Signed)
Called to confirm participation in PREP class at [facility] in August; she is going to have some surgery so would like to hold off until Sept class at [place]; will contact her first of Sept to confirm dates and set up assessment visit.

## 2023-03-25 ENCOUNTER — Other Ambulatory Visit: Payer: Self-pay | Admitting: Hematology & Oncology

## 2023-03-27 ENCOUNTER — Encounter (INDEPENDENT_AMBULATORY_CARE_PROVIDER_SITE_OTHER): Payer: Medicare HMO | Admitting: Family Medicine

## 2023-03-27 DIAGNOSIS — R052 Subacute cough: Secondary | ICD-10-CM

## 2023-03-27 NOTE — Addendum Note (Signed)
Addended by: Abbe Amsterdam C on: 03/27/2023 08:35 PM   Modules accepted: Orders

## 2023-03-27 NOTE — Telephone Encounter (Signed)
Please see the MyChart message reply(ies) for my assessment and plan.  The patient gave consent for this Medical Advice Message and is aware that it may result in a bill to their insurance company as well as the possibility that this may result in a co-payment or deductible. They are an established patient, but are not seeking medical advice exclusively about a problem treated during an in person or video visit in the last 7 days. I did not recommend an in person or video visit within 7 days of my reply.  I spent a total of 18 minutes cumulative time within 7 days through [employer] Abbe Amsterdam, MD

## 2023-03-28 ENCOUNTER — Encounter: Payer: Self-pay | Admitting: Family Medicine

## 2023-03-28 ENCOUNTER — Ambulatory Visit (HOSPITAL_BASED_OUTPATIENT_CLINIC_OR_DEPARTMENT_OTHER)
Admission: RE | Admit: 2023-03-28 | Discharge: 2023-03-28 | Disposition: A | Discharge status: Home / Self Care | Payer: Medicare HMO | Source: Ambulatory Visit | Attending: Family Medicine | Admitting: Family Medicine

## 2023-03-28 DIAGNOSIS — Z8616 Personal history of COVID-19: Secondary | ICD-10-CM | POA: Diagnosis not present

## 2023-03-28 DIAGNOSIS — R052 Subacute cough: Secondary | ICD-10-CM | POA: Diagnosis not present

## 2023-03-28 DIAGNOSIS — R059 Cough, unspecified: Secondary | ICD-10-CM | POA: Diagnosis not present

## 2023-03-28 DIAGNOSIS — I700 Atherosclerosis of aorta: Secondary | ICD-10-CM | POA: Diagnosis not present

## 2023-03-30 ENCOUNTER — Other Ambulatory Visit: Payer: Self-pay | Admitting: Family Medicine

## 2023-03-30 ENCOUNTER — Ambulatory Visit (HOSPITAL_COMMUNITY)
Admission: RE | Admit: 2023-03-30 | Discharge: 2023-03-30 | Disposition: A | Discharge status: Home / Self Care | Payer: Medicare HMO | Source: Ambulatory Visit | Attending: Hematology & Oncology | Admitting: Hematology & Oncology

## 2023-03-30 ENCOUNTER — Other Ambulatory Visit (HOSPITAL_BASED_OUTPATIENT_CLINIC_OR_DEPARTMENT_OTHER): Payer: Self-pay | Admitting: Cardiovascular Disease

## 2023-03-30 DIAGNOSIS — C7951 Secondary malignant neoplasm of bone: Secondary | ICD-10-CM | POA: Diagnosis not present

## 2023-03-30 DIAGNOSIS — C3490 Malignant neoplasm of unspecified part of unspecified bronchus or lung: Secondary | ICD-10-CM | POA: Insufficient documentation

## 2023-03-30 LAB — GLUCOSE, CAPILLARY: Glucose-Capillary: 89 mg/dL (ref 70–99)

## 2023-03-30 MED ORDER — FLUDEOXYGLUCOSE F - 18 (FDG) INJECTION
9.0000 | Freq: Once | INTRAVENOUS | Status: AC | PRN
  Administered 2023-03-30: 8.91 via INTRAVENOUS

## 2023-04-04 ENCOUNTER — Encounter: Payer: Self-pay | Admitting: Hematology & Oncology

## 2023-04-04 ENCOUNTER — Inpatient Hospital Stay (HOSPITAL_BASED_OUTPATIENT_CLINIC_OR_DEPARTMENT_OTHER): Payer: Medicare HMO | Admitting: Hematology & Oncology

## 2023-04-04 ENCOUNTER — Inpatient Hospital Stay: Payer: Medicare HMO | Attending: Hematology & Oncology

## 2023-04-04 ENCOUNTER — Other Ambulatory Visit: Payer: Self-pay

## 2023-04-04 ENCOUNTER — Encounter: Payer: Self-pay | Admitting: *Deleted

## 2023-04-04 VITALS — BP 110/60 | HR 52 | Temp 98.0°F | Resp 18 | Ht <= 58 in | Wt 177.0 lb

## 2023-04-04 DIAGNOSIS — D649 Anemia, unspecified: Secondary | ICD-10-CM | POA: Diagnosis not present

## 2023-04-04 DIAGNOSIS — N183 Chronic kidney disease, stage 3 unspecified: Secondary | ICD-10-CM | POA: Diagnosis not present

## 2023-04-04 DIAGNOSIS — C3432 Malignant neoplasm of lower lobe, left bronchus or lung: Secondary | ICD-10-CM | POA: Diagnosis not present

## 2023-04-04 DIAGNOSIS — C7951 Secondary malignant neoplasm of bone: Secondary | ICD-10-CM

## 2023-04-04 DIAGNOSIS — D631 Anemia in chronic kidney disease: Secondary | ICD-10-CM

## 2023-04-04 DIAGNOSIS — C3490 Malignant neoplasm of unspecified part of unspecified bronchus or lung: Secondary | ICD-10-CM | POA: Diagnosis not present

## 2023-04-04 LAB — RETICULOCYTES
Immature Retic Fract: 6.2 % (ref 2.3–15.9)
RBC.: 2.87 MIL/uL — ABNORMAL LOW (ref 3.87–5.11)
Retic Count, Absolute: 51.1 10*3/uL (ref 19.0–186.0)
Retic Ct Pct: 1.8 % (ref 0.4–3.1)

## 2023-04-04 LAB — CBC WITH DIFFERENTIAL (CANCER CENTER ONLY)
Abs Immature Granulocytes: 0.03 10*3/uL (ref 0.00–0.07)
Basophils Absolute: 0 10*3/uL (ref 0.0–0.1)
Basophils Relative: 1 %
Eosinophils Absolute: 0.2 10*3/uL (ref 0.0–0.5)
Eosinophils Relative: 4 %
HCT: 29.9 % — ABNORMAL LOW (ref 36.0–46.0)
Hemoglobin: 10.3 g/dL — ABNORMAL LOW (ref 12.0–15.0)
Immature Granulocytes: 1 %
Lymphocytes Relative: 23 %
Lymphs Abs: 1.1 10*3/uL (ref 0.7–4.0)
MCH: 35.9 pg — ABNORMAL HIGH (ref 26.0–34.0)
MCHC: 34.4 g/dL (ref 30.0–36.0)
MCV: 104.2 fL — ABNORMAL HIGH (ref 80.0–100.0)
Monocytes Absolute: 0.4 10*3/uL (ref 0.1–1.0)
Monocytes Relative: 8 %
Neutro Abs: 2.9 10*3/uL (ref 1.7–7.7)
Neutrophils Relative %: 63 %
Platelet Count: 191 10*3/uL (ref 150–400)
RBC: 2.87 MIL/uL — ABNORMAL LOW (ref 3.87–5.11)
RDW: 14.2 % (ref 11.5–15.5)
WBC Count: 4.6 10*3/uL (ref 4.0–10.5)
nRBC: 0 % (ref 0.0–0.2)

## 2023-04-04 LAB — IRON AND IRON BINDING CAPACITY (CC-WL,HP ONLY)
Iron: 105 ug/dL (ref 28–170)
Saturation Ratios: 22 % (ref 10.4–31.8)
TIBC: 483 ug/dL — ABNORMAL HIGH (ref 250–450)
UIBC: 378 ug/dL (ref 148–442)

## 2023-04-04 LAB — CMP (CANCER CENTER ONLY)
ALT: 44 U/L (ref 0–44)
AST: 43 U/L — ABNORMAL HIGH (ref 15–41)
Albumin: 4.4 g/dL (ref 3.5–5.0)
Alkaline Phosphatase: 86 U/L (ref 38–126)
Anion gap: 9 (ref 5–15)
BUN: 62 mg/dL — ABNORMAL HIGH (ref 8–23)
CO2: 29 mmol/L (ref 22–32)
Calcium: 9.7 mg/dL (ref 8.9–10.3)
Chloride: 96 mmol/L — ABNORMAL LOW (ref 98–111)
Creatinine: 2.29 mg/dL — ABNORMAL HIGH (ref 0.44–1.00)
GFR, Estimated: 22 mL/min — ABNORMAL LOW (ref >60)
Glucose, Bld: 94 mg/dL (ref 70–99)
Potassium: 3.9 mmol/L (ref 3.5–5.1)
Sodium: 134 mmol/L — ABNORMAL LOW (ref 135–145)
Total Bilirubin: 0.8 mg/dL (ref 0.3–1.2)
Total Protein: 7 g/dL (ref 6.5–8.1)

## 2023-04-04 LAB — FERRITIN: Ferritin: 122 ng/mL (ref 11–307)

## 2023-04-04 LAB — LACTATE DEHYDROGENASE: LDH: 231 U/L — ABNORMAL HIGH (ref 98–192)

## 2023-04-04 MED ORDER — BENZONATATE 100 MG PO CAPS: 100.0000 mg | ORAL_CAPSULE | Freq: Three times a day (TID) | ORAL | 0 refills | Status: DC | PRN

## 2023-04-04 MED ORDER — METHYLPREDNISOLONE 4 MG PO TBPK: ORAL_TABLET | ORAL | 1 refills | Status: DC

## 2023-04-04 NOTE — Progress Notes (Addendum)
Hematology and Oncology Follow Up Visit  Laura Bush [id number] 1951-01-31 72 y.o. 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 3 months  -next dose in 02/2023  S/P SBRT to LUL -- completed on 03/04/2022 S/P XRT to RIGHT femur -- completed on 03/04/2022 Alecensa 600 mg po BID -- start on 03/24/2022 --on hold starting 04/04/2023 Aranesp 300 mcg subcu every 3 weeks     Interim History:  [name] is back for follow-up.  Unfortunately, a problem that were now having his renal function is deteriorating.  I am not sure as to why this is happening.  I know that Alecensa and rare occasions can cause some renal insufficiency.  Her anemia also is a little bit worse.  I suspect this is probably from her renal function.  Her PET scan that was done on 03/30/2023 did not show any evidence of progressive or active cancer.  Next week, she did not have surgery to remove a screw  from the right hip.  I do not know that orthopedic surgery could do that after the implant was placed.  She has elevated cough.  Again, the PET scan not show anything that looked suspicious.  I will go ahead and put her on a Medrol dose pack and give her some Tessalon Perles.  She has had no bleeding.  She has had no change in bowel or bladder habits.  She has had no fever.  Has been no mouth sores.  She does state that she did have some "sloughing" of her gum lining.  I do not know why this would be happening.  I told her to try some Biotene mouth rinse and may alternate this with water and baking soda.  Her appetite has been doing okay.  She does have diabetes.  She is on I think Glucophage.  Overall, said that her performance status ECOG 1.  Medications:  Current Outpatient Medications:    acetaminophen  (TYLENOL) 500 MG tablet, Take 1 tablet (500 mg total) by mouth every 8 (eight) hours as needed for mild pain or moderate pain., Disp: 30 tablet, Rfl: 0   alectinib (ALECENSA) 150 MG capsule, Take 4 capsules (600 mg total) by mouth 2 (two) times daily with a meal., Disp: 240 capsule, Rfl: 6   aMILoride (MIDAMOR) 5 MG tablet, TAKE ONE TABLET BY MOUTH DAILY, Disp: 90 tablet, Rfl: 3   aspirin EC 81 MG tablet, Take 81 mg by mouth in the morning. Swallow whole., Disp: , Rfl:    B Complex-C (SUPER B COMPLEX PO), Take 1 tablet by mouth daily with breakfast., Disp: , Rfl:    Calcium Carb-Cholecalciferol (CALCIUM + D3 PO), Take 1 tablet by mouth in the morning., Disp: , Rfl:    cholecalciferol (VITAMIN D3) 25 MCG (1000 UNIT) tablet, Take 1,000 Units by mouth in the morning., Disp: , Rfl:    COLLAGEN PO, Take 3 tablets by mouth in the morning and at bedtime. With Biotin, Disp: , Rfl:    Denosumab (XGEVA [place]), Inject into the skin every 3 (three) months., Disp: , Rfl:    fexofenadine (ALLEGRA) 180 MG tablet, Take 180 mg by mouth in the morning., Disp: , Rfl:    folic acid (FOLATE) 400 MCG tablet, Take 400 mcg by mouth in the morning., Disp: ,  Rfl:    furosemide (LASIX) 20 MG tablet, Please take 1 tablet daily as needed for leg swelling., Disp: 30 tablet, Rfl: 3   hydrochlorothiazide (HYDRODIURIL) 25 MG tablet, Take 1 tablet (25 mg total) by mouth daily., Disp: 90 tablet, Rfl: 1   ipratropium (ATROVENT) 0.06 % nasal spray, Place 2 sprays into both nostrils 4 (four) times daily as needed for rhinitis., Disp: 15 mL, Rfl: 4   lidocaine (XYLOCAINE) 2 % solution, SMARTSIG:By Mouth, Disp: , Rfl:    metFORMIN (GLUCOPHAGE) 500 MG tablet, TAKE TWO TABELTS AT LUNCH AND ONE TABLET AT DINNER (Patient taking differently: Take 1,000 mg by mouth daily at 12 noon.), Disp: 270 tablet, Rfl: 0   pantoprazole (PROTONIX) 40 MG tablet, TAKE ONE TABLET BY MOUTH DAILY (Patient taking differently: Take 40 mg by mouth daily as needed  (indigestion/heartburn.).), Disp: 90 tablet, Rfl: 3   polyethylene glycol (MIRALAX / GLYCOLAX) 17 g packet, Take 17 g by mouth daily as needed (constipation)., Disp: , Rfl:    pravastatin (PRAVACHOL) 20 MG tablet, Take 1 tablet (20 mg total) by mouth 3 (three) times a week. (Patient taking differently: Take 20 mg by mouth every Monday, Wednesday, and Friday at 8 PM.), Disp: 40 tablet, Rfl: 3   traZODone (DESYREL) 50 MG tablet, TAKE 1 TABLET BY MOUTH EVERY NIGHT AT BEDTIME AS NEEDED FOR SLEEP (Patient taking differently: Take 50 mg by mouth at bedtime. for sleep), Disp: 90 tablet, Rfl: 2   valsartan (DIOVAN) 80 MG tablet, Take 1 tablet (80 mg total) by mouth daily., Disp: 90 tablet, Rfl: 3  Allergies:  Allergies  Allergen Reactions   Diclofenac Hypertension   Amlodipine Swelling and Other (See Comments)    Limbs swell, not the throat   Lanolin Other (See Comments)    Sneezing and watery eyes. Allergic to [facility].   Tape Other (See Comments)    Redness (Bandaids also)    Past Medical History, Surgical history, Social history, and Family History were reviewed and updated.  Review of Systems: Review of Systems  Constitutional: Negative.   HENT:  Negative.    Eyes: Negative.   Respiratory: Negative.    Cardiovascular: Negative.   Gastrointestinal: Negative.   Endocrine: Negative.   Genitourinary: Negative.    Musculoskeletal:  Positive for arthralgias.  Skin: Negative.   Neurological: Negative.   Hematological: Negative.   Psychiatric/Behavioral: Negative.      Physical Exam:  height is 4\' 10"  (1.473 m) and weight is 177 lb (80.3 kg). Her oral temperature is 98 F (36.7 C). Her blood pressure is 110/60 and her pulse is 52 (abnormal). Her respiration is 18 and oxygen saturation is 99%.   Wt Readings from Last 3 Encounters:  04/04/23 177 lb (80.3 kg)  02/15/23 179 lb 1.9 oz (81.2 kg)  02/13/23 177 lb (80.3 kg)    Physical Exam Vitals reviewed.  HENT:     Head: Normocephalic  and atraumatic.  Eyes:     Pupils: Pupils are equal, round, and reactive to light.  Cardiovascular:     Rate and Rhythm: Normal rate and regular rhythm.     Heart sounds: Normal heart sounds.  Pulmonary:     Effort: Pulmonary effort is normal.     Breath sounds: Normal breath sounds.  Abdominal:     General: Bowel sounds are normal.     Palpations: Abdomen is soft.  Musculoskeletal:        General: No tenderness or deformity. Normal range of motion.  Cervical back: Normal range of motion.     Comments: She has about 2+ edema in the lower legs.  I cannot palpate any venous cord.  She has a negative Homans' sign.  Lymphadenopathy:     Cervical: No cervical adenopathy.  Skin:    General: Skin is warm and dry.     Findings: No erythema or rash.  Neurological:     Mental Status: She is alert and oriented to person, place, and time.  Psychiatric:        Behavior: Behavior normal.        Thought Content: Thought content normal.        Judgment: Judgment normal.     Lab Results  Component Value Date   WBC 4.6 04/04/2023   HGB 10.3 (L) 04/04/2023   HCT 29.9 (L) 04/04/2023   MCV 104.2 (H) 04/04/2023   PLT 191 04/04/2023     Chemistry      Component Value Date/Time   NA 134 (L) 04/04/2023 0951   NA 141 12/27/2022 0812   NA 142 05/29/2017 1002   K 3.9 04/04/2023 0951   K 3.9 05/29/2017 1002   CL 96 (L) 04/04/2023 0951   CO2 29 04/04/2023 0951   CO2 28 05/29/2017 1002   BUN 62 (H) 04/04/2023 0951   BUN 28 (H) 12/27/2022 0812   BUN 13.5 05/29/2017 1002   CREATININE 2.29 (H) 04/04/2023 0951   CREATININE 0.81 04/06/2020 1059   CREATININE 0.8 05/29/2017 1002      Component Value Date/Time   CALCIUM 9.7 04/04/2023 0951   CALCIUM 9.4 05/29/2017 1002   ALKPHOS 86 04/04/2023 0951   ALKPHOS 39 (L) 05/29/2017 1002   AST 43 (H) 04/04/2023 0951   AST 18 05/29/2017 1002   ALT 44 04/04/2023 0951   ALT 19 05/29/2017 1002   BILITOT 0.8 04/04/2023 0951   BILITOT 0.48 05/29/2017  1002       Impression and Plan: [name] is a very charming 72 year old white female.  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  This was actually a small lesion.  She is on the [place].  She has been on Alecensa for a year.  Again, I am not sure as to why she has this progressive renal insufficiency.  We will stop the Alecensa for right now.  I do not see a problem with stopping the Alecensa.  She has anemia secondary to renal insufficiency.  We checked her an erythropoietin level on her.  It was only 15.  Had iron studies.  Ferritin was 122 with an iron saturation of 22%.  Her corrected reticulocyte count was about 1.5%.  We will try her on some Aranesp.  I think this will help get her hemoglobin back up.  I would like to recheck her renal function in a couple weeks.  We may have to decrease the dose of Alecensa or maybe put her on something different.  I would hate to try something different given the fact that the Serita Butcher has been working very nicely.  I do not see a problem with her having surgery.  I will see her back myself in a month.   Josph Macho, MD 8/27/202411:01 AM

## 2023-04-04 NOTE — Progress Notes (Unsigned)
Called radiology reading room to have PET read for this mornings appointment. PET shows continued control with no evidence of malignancy.  He renal function has been declining. Will told Alecensa and see if renal function improves off treatment.   Oncology Nurse Navigator Documentation     04/04/2023   10:00 AM  Oncology Nurse Navigator Flowsheets  Navigator Follow Up Date: 05/04/2023  Navigator Follow Up Reason: Follow-up Appointment  Navigator Location [facility]  Navigator Encounter Type Appt/Treatment Plan Review  Patient Visit Type MedOnc  Treatment Phase Active Tx  Barriers/Navigation Needs No Barriers At This Time  Interventions None Required  Acuity Level 1-No Barriers  Support Groups/Services Friends and Family  Time Spent with Patient 15

## 2023-04-05 ENCOUNTER — Encounter: Payer: Self-pay | Admitting: Family Medicine

## 2023-04-05 ENCOUNTER — Encounter: Payer: Self-pay | Admitting: Hematology & Oncology

## 2023-04-05 LAB — ERYTHROPOIETIN: Erythropoietin: 15 m[IU]/mL (ref 2.6–18.5)

## 2023-04-06 ENCOUNTER — Encounter: Payer: Self-pay | Admitting: Family Medicine

## 2023-04-06 ENCOUNTER — Encounter (HOSPITAL_COMMUNITY): Payer: Self-pay | Admitting: Orthopedic Surgery

## 2023-04-06 ENCOUNTER — Telehealth: Payer: Self-pay

## 2023-04-06 ENCOUNTER — Encounter: Payer: Self-pay | Admitting: Hematology & Oncology

## 2023-04-06 ENCOUNTER — Other Ambulatory Visit: Payer: Self-pay

## 2023-04-06 DIAGNOSIS — G4733 Obstructive sleep apnea (adult) (pediatric): Secondary | ICD-10-CM | POA: Diagnosis not present

## 2023-04-06 NOTE — Progress Notes (Signed)
PCP - Abbe [place] Cardiologist - Cindy Hazy Oncologist - Dr Arlan Organ  PPM/ICD - Denies Device Orders - n/a Rep Notified - n/a  Chest x-ray - 03-28-23 EKG - 12-21-22 Stress Test - 05-11-16 ECHO - 01-18-23 Cardiac Cath - denies  CPAP - use nightly  DM - Denies. Does take metformin insulin resistant, weight loss.  Blood Thinner Instructions: Denies Aspirin Instructions: Made aware to follow MD instructions  ERAS Protcol - ERAS no drink  COVID TEST- n/a. Tested positive in July.  Anesthesia review: yes. Recently started on Prednisone . Prednisone will be completed on Sept. 1, 2024.  Patient verbally denies any shortness of breath, fever, cough and chest pain during phone call   -------------  SDW INSTRUCTIONS given:  Your procedure is scheduled on April 11, 2023.  Report to [facility] Main Entrance "A" at 5:30 A.M., and check in at the Admitting office.  Call this number if you have problems the morning of surgery:  [phone]   Remember:  Do not eat after midnight the night before your surgery  You may drink clear liquids until 5:00 A.M. the morning of your surgery.   Clear liquids allowed are: Water, Non-Citrus Juices (without pulp), Carbonated Beverages, Clear Tea, Black Coffee Only, and Gatorade    Take these medicines the morning of surgery with A SIP OF WATER  alectinib (ALECENSA)  fexofenadine (ALLEGRA)  methylPREDNISolone (MEDROL DOSEPAK)  IF NEEDED; acetaminophen (TYLENOL)  benzonatate (TESSALON)  ipratropium (ATROVENT) 0.06 % nasal spray  pantoprazole (PROTONIX)  polyethylene glycol (MIRALAX / GLYCOLAX)    As of today, STOP taking any Aspirin (unless otherwise instructed by your surgeon) Aleve, Naproxen, Ibuprofen, Motrin, Advil, Goody's, BC's, all herbal medications, fish oil, and all vitamins.   Aspirin EC  follow instructions given to you by MD metFORMIN (GLUCOPHAGE  Patient states does not have DM take this medication for insulin  resistant and weight loss. Made aware to hold medication morning of surgery              Do not wear jewelry, make up, or nail polish            Do not wear lotions, powders, perfumes/colognes, or deodorant.            Do not shave 48 hours prior to surgery.  Men may shave face and neck.            Do not bring valuables to the hospital.            [facility] is not responsible for any belongings or valuables.  Do NOT Smoke (Tobacco/Vaping) 24 hours prior to your procedure If you use a CPAP at night, you may bring all equipment for your overnight stay.   Contacts, glasses, dentures or bridgework may not be worn into surgery.      For patients admitted to the hospital, discharge time will be determined by your treatment team.   Patients discharged the day of surgery will not be allowed to drive home, and someone needs to stay with them for 24 hours.    Special instructions:   [place]- Preparing For Surgery  Before surgery, you can play an important role. Because skin is not sterile, your skin needs to be as free of germs as possible. You can reduce the number of germs on your skin by washing with CHG (chlorahexidine gluconate) Soap before surgery.  CHG is an antiseptic cleaner which kills germs and bonds with the skin to continue killing  germs even after washing.    Oral Hygiene is also important to reduce your risk of infection.  Remember - BRUSH YOUR TEETH THE MORNING OF SURGERY WITH YOUR REGULAR TOOTHPASTE  Please do not use if you have an allergy to CHG or antibacterial soaps. If your skin becomes reddened/irritated stop using the CHG.  Do not shave (including legs and underarms) for at least 48 hours prior to first CHG shower. It is OK to shave your face.  Please follow these instructions carefully.   Shower the NIGHT BEFORE SURGERY and the MORNING OF SURGERY with DIAL Soap.   Pat yourself dry with a CLEAN TOWEL.  Wear CLEAN PAJAMAS to bed the night before surgery  Place  CLEAN SHEETS on your bed the night of your first shower and DO NOT SLEEP WITH PETS.   Day of Surgery: Please shower morning of surgery  Wear Clean/Comfortable clothing the morning of surgery Do not apply any deodorants/lotions.   Remember to brush your teeth WITH YOUR REGULAR TOOTHPASTE.   Questions were answered. Patient verbalized understanding of instructions.

## 2023-04-06 NOTE — Telephone Encounter (Signed)
Called to confirm participation in PREP class at [facility] on 9/10; she is having hardware removed and wants to wait to start until October 1 class; will conact mid-late Sept to confirm and set up assessment visit.

## 2023-04-10 NOTE — Anesthesia Preprocedure Evaluation (Signed)
Anesthesia Evaluation  Patient identified by MRN, date of birth, ID band Patient awake    Reviewed: Allergy & Precautions, NPO status , Patient's Chart, lab work & pertinent test results  History of Anesthesia Complications (+) PONV and history of anesthetic complications  Airway Mallampati: III  TM Distance: >3 FB Neck ROM: Full   Comment: Previous grade II view with MAC 4, easy mask Dental  (+) Dental Advisory Given   Pulmonary neg shortness of breath, asthma , sleep apnea and Continuous Positive Airway Pressure Ventilation , neg COPD, neg recent URI Metastatic lung cancer, had COVID in July   Pulmonary exam normal breath sounds clear to auscultation       Cardiovascular hypertension (amiloride, furosemide, HCTZ, valsartan), Pt. on medications (-) angina + CAD (non-obstructive)  (-) Past MI, (-) Cardiac Stents and (-) CABG + dysrhythmias (BBB) + Valvular Problems/Murmurs  Rhythm:Regular Rate:Normal  HLD  TTE 01/18/2023: IMPRESSIONS    1. Left ventricular ejection fraction, by estimation, is 60 to 65%. The  left ventricle has normal function. The left ventricle has no regional  wall motion abnormalities. Left ventricular diastolic parameters are  consistent with Grade I diastolic  dysfunction (impaired relaxation).   2. Right ventricular systolic function is normal. The right ventricular  size is moderately enlarged.   3. The mitral valve is normal in structure. No evidence of mitral valve  regurgitation. No evidence of mitral stenosis.   4. The aortic valve is normal in structure. Aortic valve regurgitation is  not visualized. No aortic stenosis is present.   5. The inferior vena cava is normal in size with greater than 50%  respiratory variability, suggesting right atrial pressure of 3 mmHg.     Neuro/Psych  Headaches, neg Seizures CVA (2022), No Residual Symptoms    GI/Hepatic Neg liver ROS,GERD  Medicated,,   Endo/Other  negative endocrine ROS    Renal/GU negative Renal ROS     Musculoskeletal  (+) Arthritis ,    Abdominal  (+) + obese  Peds  Hematology  (+) Blood dyscrasia (hemochromatosis)   Anesthesia Other Findings Finished a 5 day course of prednisone on Sunday for residual cough from COVID in July  Reproductive/Obstetrics H/o left breast cancer                             Anesthesia Physical Anesthesia Plan  ASA: 3  Anesthesia Plan: General   Post-op Pain Management: Tylenol PO (pre-op)*   Induction: Intravenous  PONV Risk Score and Plan: 4 or greater and Ondansetron, Dexamethasone and Treatment may vary due to age or medical condition  Airway Management Planned: Oral ETT  Additional Equipment:   Intra-op Plan:   Post-operative Plan: Extubation in OR  Informed Consent: I have reviewed the patients History and Physical, chart, labs and discussed the procedure including the risks, benefits and alternatives for the proposed anesthesia with the patient or authorized representative who has indicated his/her understanding and acceptance.     Dental advisory given  Plan Discussed with: CRNA and Anesthesiologist  Anesthesia Plan Comments: (Risks of general anesthesia discussed including, but not limited to, sore throat, hoarse voice, chipped/damaged teeth, injury to vocal cords, nausea and vomiting, allergic reactions, lung infection, heart attack, stroke, and death. All questions answered. )        Anesthesia Quick Evaluation

## 2023-04-10 NOTE — H&P (Signed)
Orthopaedic Trauma Service (OTS) Consult   Patient ID: Laura Bush MRN: [id number] DOB/AGE: 12/25/1950 72 y.o.     HPI: Laura Bush is an 72 y.o. female s/p IMN pathologic (lung CA) R femur fracture 12/2021. She has done very well with respects to her femur fracture and treatment for her Lung CA. She has been having intermittent pain in her right femur.  It has been affecting her ability to precipitate with therapy.  Initially thought it was due to her chemotherapy regimen but has not resolved.  X-rays in the office do show some heterotopic bone along the lateral thigh as well as potentially prominent distal femoral locking bolts.  Decision was made to proceed with hardware removal of the distal locking screws as well as excision of her heterotopic bone.  Past Medical History:  Diagnosis Date   Allergy    seasonal   Arthritis Last several years   Mostly in hands, wrists, shoulders, knees   Asthma    in cold weather   Breast cancer (HCC) 09/18/2014   left breast   Breast cancer (HCC) 10/07/2014   bx left breast   Breast cancer of upper-outer quadrant of left female breast (HCC) 09/22/2014   Cluster headaches    in her 40's   GERD (gastroesophageal reflux disease)    Goals of care, counseling/discussion 01/14/2022   Heart murmur    as a child (has outgrown)   High cholesterol    History of radiation therapy    Left lung, Right Femur- 02/16/22-03/04/22- Dr. 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"   Stroke [facility]) summer 2022   described as tiny   Swallowing difficulty    Venous insufficiency     Past Surgical History:  Procedure Laterality Date   ABDOMINAL HYSTERECTOMY  1999   APPENDECTOMY  ~ 2006   BONE BIOPSY Right 01/04/2022   Procedure: RIGHT FEMORAL BONE BIOPSY;  Surgeon: Myrene Galas, MD;  Location: [facility] OR;  Service: Orthopedics;  Laterality: Right;   BREAST BIOPSY Left 10/2014   BREAST LUMPECTOMY Left 2016   BREAST REDUCTION SURGERY Bilateral 11/11/2014   Procedure: Bilateral Breast Reduction;  Surgeon: Etter Sjogren, MD;  Location: [facility] OR;  Service: Plastics;  Laterality: Bilateral;   BRONCHIAL BIOPSY  01/06/2022   Procedure: BRONCHIAL BIOPSIES;  Surgeon: Leslye Peer, MD;  Location: [facility] ENDOSCOPY;  Service: Pulmonary;;   BRONCHIAL BRUSHINGS  01/06/2022   Procedure: BRONCHIAL BRUSHINGS;  Surgeon: Leslye Peer, MD;  Location: [facility] ENDOSCOPY;  Service: Pulmonary;;   BRONCHIAL NEEDLE ASPIRATION BIOPSY  01/06/2022   Procedure: BRONCHIAL NEEDLE ASPIRATION BIOPSIES;  Surgeon: Leslye Peer, MD;  Location: [facility] ENDOSCOPY;  Service: Pulmonary;;   BRONCHIAL WASHINGS  01/06/2022   Procedure: BRONCHIAL WASHINGS;  Surgeon: Leslye Peer, MD;  Location: [facility] ENDOSCOPY;  Service: Pulmonary;;   CARPAL TUNNEL RELEASE Bilateral    2 surgeries  on right, 1 on left   CESAREAN SECTION  1978; 1980   COLONOSCOPY     FEMUR IM NAIL Right 01/04/2022   Procedure: RIGHT FEMORAL INTRAMEDULLARY (IM) NAIL;  Surgeon: Myrene Galas, MD;  Location: [facility] OR;  Service: Orthopedics;  Laterality: Right;   FIDUCIAL MARKER PLACEMENT  01/06/2022   Procedure: FIDUCIAL MARKER PLACEMENT;  Surgeon: Leslye Peer, MD;  Location: [facility] ENDOSCOPY;  Service: Pulmonary;;   FOREARM FRACTURE SURGERY Right 2003 X 3   FRACTURE SURGERY  2003, 2023   3 surgeries, right arm -03; Femur-23   KNEE ARTHROSCOPY Bilateral    meniscus repair   PARTIAL MASTECTOMY WITH NEEDLE LOCALIZATION AND AXILLARY SENTINEL  LYMPH NODE BX Left 11/11/2014   Procedure: LEFT BREAST PARTIAL MASTECTOMY WITH NEEDLE LOCALIZATION TIMES TWO AND LEFT AXILLARY SENTINEL LYMPH NODE Biopsy;  Surgeon: Claud Kelp, MD;  Location: [facility] OR;  Service: General;  Laterality: Left;   REDUCTION MAMMAPLASTY Bilateral 2016   SQUAMOUS CELL CARCINOMA EXCISION  1980's X 1   "nose"   TONSILLECTOMY  ~ 1957   TUBAL LIGATION  ?1984    Family History  Problem Relation Age of Onset   Dementia Mother        Lives in [place]   Hypertension Mother    Hyperlipidemia Mother    Stroke Father        deceased   Heart failure Father    Hypertension Father    Uterine cancer Sister 67   Heart attack Maternal Grandmother    Hypertension Maternal Grandfather    Stroke Paternal Grandfather    Colon cancer Neg Hx    Esophageal cancer Neg Hx    Pancreatic cancer Neg Hx    Stomach cancer Neg Hx    Liver disease Neg Hx    Sleep apnea Neg Hx     Social History:  reports that she has never smoked. She has never used smokeless tobacco. She reports current alcohol use of about 2.0 standard drinks of alcohol per week. She reports that she does not use drugs.  Allergies:  Allergies  Allergen Reactions   Diclofenac Hypertension   Amlodipine Swelling and Other (See Comments)    Limbs swell, not the throat   Lanolin Other (See Comments)    Sneezing and watery eyes. Allergic to [facility].   Tape Other (See Comments)    Redness (Bandaids also)    Medications: I have reviewed the patient's current medications. Current Outpatient Medications  Medication Instructions   Acetaminophen Extra Strength (TYLENOL) 500 mg, Oral, Every 8 hours PRN   Alecensa 600 mg, Oral, 2 times daily with meals   aMILoride (MIDAMOR) 5 mg, Oral, Daily   aspirin EC 81 mg, Oral, Every morning, Swallow whole.   B Complex-C (SUPER B COMPLEX PO) 1 tablet, Oral, Daily with breakfast   benzonatate (TESSALON) 100 mg, Oral, 3 times daily PRN   Calcium Carb-Cholecalciferol (CALCIUM +  D3 PO) 1 tablet, Oral, Every morning   cholecalciferol (VITAMIN D3) 1,000 Units, Oral, Every morning   COLLAGEN PO 3 tablets, Oral, 2 times daily, With Biotin   Denosumab (XGEVA [place]) Subcutaneous, Every 3 months   fexofenadine (ALLEGRA) 180 mg, Oral, Every morning   folic acid (FOLATE) 400 mcg, Oral, Every morning   furosemide (LASIX) 20 MG tablet Please take 1 tablet daily as needed for leg swelling.   hydrochlorothiazide (HYDRODIURIL) 25 mg, Oral, Daily   ipratropium (ATROVENT) 0.06 % nasal spray 2 sprays, Each Nare, 4 times daily PRN  lidocaine (XYLOCAINE) 2 % solution SMARTSIG:By Mouth   metFORMIN (GLUCOPHAGE) 500 MG tablet TAKE TWO TABELTS AT LUNCH AND ONE TABLET AT DINNER   methylPREDNISolone (MEDROL DOSEPAK) 4 MG TBPK tablet Take as directed:  6-5-4-3-2-1   pantoprazole (PROTONIX) 40 mg, Oral, Daily   polyethylene glycol (MIRALAX / GLYCOLAX) 17 g, Oral, Daily PRN   pravastatin (PRAVACHOL) 20 mg, Oral, 3 times weekly   traZODone (DESYREL) 50 mg, Oral, At bedtime PRN, for sleep   valsartan (DIOVAN) 80 mg, Oral, Daily     No results found for this or any previous visit (from the past 48 hour(s)).  No results found.  Intake/Output    None      Review of Systems  Constitutional:  Negative for chills and fever.  Respiratory:  Negative for shortness of breath.   Cardiovascular:  Negative for chest pain and palpitations.  Gastrointestinal:  Negative for nausea and vomiting.  Musculoskeletal:        Right knee and thigh pain  Neurological:  Negative for tremors and sensory change.   Height 4\' 10"  (1.473 m), weight 80.3 kg. Physical Exam Constitutional:      General: She is not in acute distress.    Appearance: Normal appearance.  HENT:     Mouth/Throat:     Mouth: Mucous membranes are moist.  Eyes:     Extraocular Movements: Extraocular movements intact.  Cardiovascular:     Rate and Rhythm: Normal rate and regular rhythm.  Pulmonary:     Effort: Pulmonary effort is  normal.  Musculoskeletal:     Comments: Right lower extremity Surgical wounds well-healed No pitting edema Motor and sensory functions intact Excellent hip and knee range of motion Nontender over her fracture site Tender over distal locking bolts as well as heterotopic bone lateral soft tissue of thigh Palpable DP pulse Extremity is warm.  Good perfusion distally  Skin:    Capillary Refill: Capillary refill takes less than 2 seconds.  Neurological:     General: No focal deficit present.     Mental Status: She is alert and oriented to person, place, and time.  Psychiatric:        Mood and Affect: Mood normal.        Behavior: Behavior normal.     Assessment/Plan:  71 year old female symptomatic hardware right femur and heterotopic bone right thigh  -s/p intramedullary nailing of pathologic right femur fracture with symptomatic hardware and symptomatic heterotopic bone  OR for removal of distal locking bolts as well as excision of heterotopic bone  No restrictions postop  Risks and benefits reviewed with patient.  She understands that this may not alleviate her symptoms  Outpatient procedure  Mearl Latin, PA-C [phone] Salena Saner) 04/10/2023, 8:20 PM  [facility] [address] [place] [address] [phone] Collier Bullock (F)    After 5pm and on the weekends please log on to Amion, go to orthopaedics and the look under the [facility] Call for the provider(s) on call. You can also call our office at [phone] and then follow the prompts to be connected to the call team.

## 2023-04-11 ENCOUNTER — Ambulatory Visit (HOSPITAL_BASED_OUTPATIENT_CLINIC_OR_DEPARTMENT_OTHER): Payer: Medicare HMO | Admitting: Anesthesiology

## 2023-04-11 ENCOUNTER — Encounter (HOSPITAL_COMMUNITY)
Admission: RE | Disposition: A | Discharge status: Home / Self Care | Payer: Self-pay | Source: Home / Self Care | Attending: Orthopedic Surgery

## 2023-04-11 ENCOUNTER — Other Ambulatory Visit: Payer: Self-pay

## 2023-04-11 ENCOUNTER — Ambulatory Visit (HOSPITAL_COMMUNITY): Payer: Medicare HMO

## 2023-04-11 ENCOUNTER — Ambulatory Visit (HOSPITAL_COMMUNITY): Payer: Medicare HMO | Admitting: Anesthesiology

## 2023-04-11 ENCOUNTER — Encounter: Payer: Self-pay | Admitting: Hematology & Oncology

## 2023-04-11 ENCOUNTER — Encounter (HOSPITAL_COMMUNITY): Payer: Self-pay | Admitting: Orthopedic Surgery

## 2023-04-11 ENCOUNTER — Ambulatory Visit (HOSPITAL_COMMUNITY)
Admission: RE | Admit: 2023-04-11 | Discharge: 2023-04-11 | Disposition: A | Discharge status: Home / Self Care | Payer: Medicare HMO | Attending: Orthopedic Surgery | Admitting: Orthopedic Surgery

## 2023-04-11 ENCOUNTER — Telehealth (INDEPENDENT_AMBULATORY_CARE_PROVIDER_SITE_OTHER): Payer: Medicare HMO | Admitting: Family Medicine

## 2023-04-11 DIAGNOSIS — T8484XA Pain due to internal orthopedic prosthetic devices, implants and grafts, initial encounter: Secondary | ICD-10-CM | POA: Insufficient documentation

## 2023-04-11 DIAGNOSIS — G4733 Obstructive sleep apnea (adult) (pediatric): Secondary | ICD-10-CM | POA: Diagnosis not present

## 2023-04-11 DIAGNOSIS — I2510 Atherosclerotic heart disease of native coronary artery without angina pectoris: Secondary | ICD-10-CM | POA: Insufficient documentation

## 2023-04-11 DIAGNOSIS — I10 Essential (primary) hypertension: Secondary | ICD-10-CM | POA: Diagnosis not present

## 2023-04-11 DIAGNOSIS — E782 Mixed hyperlipidemia: Secondary | ICD-10-CM

## 2023-04-11 DIAGNOSIS — M61551 Other ossification of muscle, right thigh: Secondary | ICD-10-CM | POA: Diagnosis not present

## 2023-04-11 DIAGNOSIS — K219 Gastro-esophageal reflux disease without esophagitis: Secondary | ICD-10-CM | POA: Insufficient documentation

## 2023-04-11 DIAGNOSIS — Z8673 Personal history of transient ischemic attack (TIA), and cerebral infarction without residual deficits: Secondary | ICD-10-CM | POA: Insufficient documentation

## 2023-04-11 DIAGNOSIS — Z9221 Personal history of antineoplastic chemotherapy: Secondary | ICD-10-CM | POA: Diagnosis not present

## 2023-04-11 DIAGNOSIS — Z01818 Encounter for other preprocedural examination: Secondary | ICD-10-CM

## 2023-04-11 DIAGNOSIS — E7800 Pure hypercholesterolemia, unspecified: Secondary | ICD-10-CM | POA: Diagnosis not present

## 2023-04-11 DIAGNOSIS — Z85118 Personal history of other malignant neoplasm of bronchus and lung: Secondary | ICD-10-CM | POA: Diagnosis not present

## 2023-04-11 DIAGNOSIS — Z472 Encounter for removal of internal fixation device: Secondary | ICD-10-CM | POA: Diagnosis not present

## 2023-04-11 DIAGNOSIS — Y793 Surgical instruments, materials and orthopedic devices (including sutures) associated with adverse incidents: Secondary | ICD-10-CM | POA: Diagnosis not present

## 2023-04-11 DIAGNOSIS — Z8781 Personal history of (healed) traumatic fracture: Secondary | ICD-10-CM | POA: Insufficient documentation

## 2023-04-11 DIAGNOSIS — M61451 Other calcification of muscle, right thigh: Secondary | ICD-10-CM | POA: Diagnosis not present

## 2023-04-11 DIAGNOSIS — M898X5 Other specified disorders of bone, thigh: Secondary | ICD-10-CM | POA: Diagnosis not present

## 2023-04-11 HISTORY — PX: HARDWARE REMOVAL: SHX979

## 2023-04-11 HISTORY — PX: BONE EXCISION: SHX6730

## 2023-04-11 LAB — COMPREHENSIVE METABOLIC PANEL
ALT: 54 U/L — ABNORMAL HIGH (ref 0–44)
AST: 40 U/L (ref 15–41)
Albumin: 3.6 g/dL (ref 3.5–5.0)
Alkaline Phosphatase: 62 U/L (ref 38–126)
Anion gap: 12 (ref 5–15)
BUN: 20 mg/dL (ref 8–23)
CO2: 25 mmol/L (ref 22–32)
Calcium: 9.2 mg/dL (ref 8.9–10.3)
Chloride: 101 mmol/L (ref 98–111)
Creatinine, Ser: 1.11 mg/dL — ABNORMAL HIGH (ref 0.44–1.00)
GFR, Estimated: 53 mL/min — ABNORMAL LOW (ref >60)
Glucose, Bld: 90 mg/dL (ref 70–99)
Potassium: 3.8 mmol/L (ref 3.5–5.1)
Sodium: 138 mmol/L (ref 135–145)
Total Bilirubin: 0.8 mg/dL (ref 0.3–1.2)
Total Protein: 6.4 g/dL — ABNORMAL LOW (ref 6.5–8.1)

## 2023-04-11 LAB — CBC WITH DIFFERENTIAL/PLATELET
Abs Immature Granulocytes: 0.07 10*3/uL (ref 0.00–0.07)
Basophils Absolute: 0 10*3/uL (ref 0.0–0.1)
Basophils Relative: 1 %
Eosinophils Absolute: 0.1 10*3/uL (ref 0.0–0.5)
Eosinophils Relative: 2 %
HCT: 29 % — ABNORMAL LOW (ref 36.0–46.0)
Hemoglobin: 10 g/dL — ABNORMAL LOW (ref 12.0–15.0)
Immature Granulocytes: 1 %
Lymphocytes Relative: 21 %
Lymphs Abs: 1.1 10*3/uL (ref 0.7–4.0)
MCH: 35.7 pg — ABNORMAL HIGH (ref 26.0–34.0)
MCHC: 34.5 g/dL (ref 30.0–36.0)
MCV: 103.6 fL — ABNORMAL HIGH (ref 80.0–100.0)
Monocytes Absolute: 0.6 10*3/uL (ref 0.1–1.0)
Monocytes Relative: 11 %
Neutro Abs: 3.2 10*3/uL (ref 1.7–7.7)
Neutrophils Relative %: 64 %
Platelets: 199 10*3/uL (ref 150–400)
RBC: 2.8 MIL/uL — ABNORMAL LOW (ref 3.87–5.11)
RDW: 14 % (ref 11.5–15.5)
WBC: 5.1 10*3/uL (ref 4.0–10.5)
nRBC: 0 % (ref 0.0–0.2)

## 2023-04-11 SURGERY — REMOVAL, HARDWARE
Anesthesia: General | Site: Leg Lower | Laterality: Right

## 2023-04-11 MED ORDER — LACTATED RINGERS IV SOLN
INTRAVENOUS | Status: DC | PRN
Start: 2023-04-11 — End: 2023-04-11

## 2023-04-11 MED ORDER — FENTANYL CITRATE (PF) 100 MCG/2ML IJ SOLN: 25.0000 ug | INTRAMUSCULAR | Status: DC | PRN

## 2023-04-11 MED ORDER — DEXAMETHASONE SODIUM PHOSPHATE 10 MG/ML IJ SOLN
INTRAMUSCULAR | Status: AC
  Filled 2023-04-11: qty 1

## 2023-04-11 MED ORDER — HYDROCODONE-ACETAMINOPHEN 5-325 MG PO TABS: 1.0000 | ORAL_TABLET | Freq: Three times a day (TID) | ORAL | 0 refills | Status: DC | PRN

## 2023-04-11 MED ORDER — PROPOFOL 10 MG/ML IV BOLUS
INTRAVENOUS | Status: AC
  Filled 2023-04-11: qty 20

## 2023-04-11 MED ORDER — CEFAZOLIN SODIUM-DEXTROSE 2-4 GM/100ML-% IV SOLN
2.0000 g | INTRAVENOUS | Status: AC
  Administered 2023-04-11: 2 g via INTRAVENOUS
  Filled 2023-04-11: qty 100

## 2023-04-11 MED ORDER — FENTANYL CITRATE (PF) 250 MCG/5ML IJ SOLN
INTRAMUSCULAR | Status: AC
  Filled 2023-04-11: qty 5

## 2023-04-11 MED ORDER — ORAL CARE MOUTH RINSE: 15.0000 mL | Freq: Once | OROMUCOSAL | Status: AC

## 2023-04-11 MED ORDER — BUPIVACAINE-EPINEPHRINE 0.25% -1:200000 IJ SOLN
INTRAMUSCULAR | Status: DC | PRN
Start: 2023-04-11 — End: 2023-04-11
  Administered 2023-04-11: 20 mL

## 2023-04-11 MED ORDER — ONDANSETRON 4 MG PO TBDP
4.0000 mg | ORAL_TABLET | Freq: Three times a day (TID) | ORAL | 0 refills | Status: DC | PRN
Start: 2023-04-11 — End: 2023-05-04

## 2023-04-11 MED ORDER — LACTATED RINGERS IV SOLN: INTRAVENOUS | Status: DC

## 2023-04-11 MED ORDER — MIDAZOLAM HCL 2 MG/2ML IJ SOLN
INTRAMUSCULAR | Status: AC
  Filled 2023-04-11: qty 2

## 2023-04-11 MED ORDER — DEXAMETHASONE SODIUM PHOSPHATE 10 MG/ML IJ SOLN
INTRAMUSCULAR | Status: DC | PRN
  Administered 2023-04-11: 5 mg via INTRAVENOUS

## 2023-04-11 MED ORDER — FENTANYL CITRATE (PF) 250 MCG/5ML IJ SOLN
INTRAMUSCULAR | Status: DC | PRN
  Administered 2023-04-11: 50 ug via INTRAVENOUS
  Administered 2023-04-11: 100 ug via INTRAVENOUS
  Administered 2023-04-11 (×2): 50 ug via INTRAVENOUS

## 2023-04-11 MED ORDER — SUGAMMADEX SODIUM 200 MG/2ML IV SOLN
INTRAVENOUS | Status: DC | PRN
  Administered 2023-04-11: 200 mg via INTRAVENOUS

## 2023-04-11 MED ORDER — 0.9 % SODIUM CHLORIDE (POUR BTL) OPTIME
TOPICAL | Status: DC | PRN
  Administered 2023-04-11: 1000 mL

## 2023-04-11 MED ORDER — ROCURONIUM BROMIDE 10 MG/ML (PF) SYRINGE
PREFILLED_SYRINGE | INTRAVENOUS | Status: DC | PRN
  Administered 2023-04-11: 60 mg via INTRAVENOUS

## 2023-04-11 MED ORDER — OXYCODONE HCL 5 MG/5ML PO SOLN: 5.0000 mg | Freq: Once | ORAL | Status: DC | PRN

## 2023-04-11 MED ORDER — OXYCODONE HCL 5 MG PO TABS: 5.0000 mg | ORAL_TABLET | Freq: Once | ORAL | Status: DC | PRN

## 2023-04-11 MED ORDER — CHLORHEXIDINE GLUCONATE 0.12 % MT SOLN
15.0000 mL | Freq: Once | OROMUCOSAL | Status: AC
  Administered 2023-04-11: 15 mL via OROMUCOSAL
  Filled 2023-04-11: qty 15

## 2023-04-11 MED ORDER — LIDOCAINE 2% (20 MG/ML) 5 ML SYRINGE
INTRAMUSCULAR | Status: DC | PRN
  Administered 2023-04-11: 100 mg via INTRAVENOUS

## 2023-04-11 MED ORDER — DEXMEDETOMIDINE HCL IN NACL 80 MCG/20ML IV SOLN
INTRAVENOUS | Status: DC | PRN
  Administered 2023-04-11: 8 ug via INTRAVENOUS

## 2023-04-11 MED ORDER — ONDANSETRON HCL 4 MG/2ML IJ SOLN
INTRAMUSCULAR | Status: DC | PRN
  Administered 2023-04-11: 4 mg via INTRAVENOUS

## 2023-04-11 MED ORDER — BUPIVACAINE-EPINEPHRINE (PF) 0.25% -1:200000 IJ SOLN
INTRAMUSCULAR | Status: AC
  Filled 2023-04-11: qty 30

## 2023-04-11 MED ORDER — DEXMEDETOMIDINE HCL IN NACL 80 MCG/20ML IV SOLN
INTRAVENOUS | Status: AC
  Filled 2023-04-11: qty 20

## 2023-04-11 MED ORDER — ONDANSETRON HCL 4 MG/2ML IJ SOLN
INTRAMUSCULAR | Status: AC
  Filled 2023-04-11: qty 2

## 2023-04-11 MED ORDER — SODIUM CHLORIDE 0.9 % IV SOLN: INTRAVENOUS | Status: DC

## 2023-04-11 MED ORDER — LIDOCAINE 2% (20 MG/ML) 5 ML SYRINGE
INTRAMUSCULAR | Status: AC
  Filled 2023-04-11: qty 5

## 2023-04-11 MED ORDER — ROCURONIUM BROMIDE 10 MG/ML (PF) SYRINGE
PREFILLED_SYRINGE | INTRAVENOUS | Status: AC
  Filled 2023-04-11: qty 10

## 2023-04-11 MED ORDER — ACETAMINOPHEN 500 MG PO TABS
1000.0000 mg | ORAL_TABLET | Freq: Once | ORAL | Status: AC
  Administered 2023-04-11: 1000 mg via ORAL
  Filled 2023-04-11: qty 2

## 2023-04-11 MED ORDER — PROPOFOL 10 MG/ML IV BOLUS
INTRAVENOUS | Status: DC | PRN
  Administered 2023-04-11: 180 mg via INTRAVENOUS

## 2023-04-11 SURGICAL SUPPLY — 68 items
BAG COUNTER SPONGE SURGICOUNT (BAG) ×1 IMPLANT
BAG SPNG CNTER NS LX DISP (BAG) ×1
BANDAGE ESMARK 6X9 LF (GAUZE/BANDAGES/DRESSINGS) ×1 IMPLANT
BNDG CMPR 5X4 KNIT ELC UNQ LF (GAUZE/BANDAGES/DRESSINGS) ×1
BNDG CMPR 5X6 CHSV STRCH STRL (GAUZE/BANDAGES/DRESSINGS) ×1
BNDG CMPR 6 X 5 YARDS HK CLSR (GAUZE/BANDAGES/DRESSINGS) ×1
BNDG CMPR 9X6 STRL LF SNTH (GAUZE/BANDAGES/DRESSINGS) ×1
BNDG COHESIVE 6X5 TAN ST LF (GAUZE/BANDAGES/DRESSINGS) ×1 IMPLANT
BNDG ELASTIC 4INX 5YD STR LF (GAUZE/BANDAGES/DRESSINGS) IMPLANT
BNDG ELASTIC 4X5.8 VLCR STR LF (GAUZE/BANDAGES/DRESSINGS) ×1 IMPLANT
BNDG ELASTIC 6INX 5YD STR LF (GAUZE/BANDAGES/DRESSINGS) IMPLANT
BNDG ELASTIC 6X5.8 VLCR STR LF (GAUZE/BANDAGES/DRESSINGS) ×1 IMPLANT
BNDG ESMARK 6X9 LF (GAUZE/BANDAGES/DRESSINGS) ×1
BNDG GAUZE DERMACEA FLUFF 4 (GAUZE/BANDAGES/DRESSINGS) ×2 IMPLANT
BNDG GZE DERMACEA 4 6PLY (GAUZE/BANDAGES/DRESSINGS) ×1
BRUSH SCRUB EZ PLAIN DRY (MISCELLANEOUS) ×2 IMPLANT
COVER SURGICAL LIGHT HANDLE (MISCELLANEOUS) ×2 IMPLANT
CUFF TOURN SGL QUICK 18X4 (TOURNIQUET CUFF) IMPLANT
CUFF TOURN SGL QUICK 24 (TOURNIQUET CUFF)
CUFF TOURN SGL QUICK 34 (TOURNIQUET CUFF)
CUFF TRNQT CYL 24X4X16.5-23 (TOURNIQUET CUFF) IMPLANT
CUFF TRNQT CYL 34X4.125X (TOURNIQUET CUFF) IMPLANT
DRAPE C-ARM 42X72 X-RAY (DRAPES) IMPLANT
DRAPE C-ARMOR (DRAPES) ×1 IMPLANT
DRAPE U-SHAPE 47X51 STRL (DRAPES) ×1 IMPLANT
DRSG ADAPTIC 3X8 NADH LF (GAUZE/BANDAGES/DRESSINGS) ×1 IMPLANT
DRSG EMULSION OIL 3X3 NADH (GAUZE/BANDAGES/DRESSINGS) IMPLANT
ELECT REM PT RETURN 9FT ADLT (ELECTROSURGICAL) ×1
ELECTRODE REM PT RTRN 9FT ADLT (ELECTROSURGICAL) ×1 IMPLANT
GAUZE PAD ABD 8X10 STRL (GAUZE/BANDAGES/DRESSINGS) IMPLANT
GAUZE SPONGE 4X4 12PLY STRL (GAUZE/BANDAGES/DRESSINGS) ×1 IMPLANT
GLOVE BIO SURGEON STRL SZ7.5 (GLOVE) ×1 IMPLANT
GLOVE BIO SURGEON STRL SZ8 (GLOVE) ×1 IMPLANT
GLOVE BIOGEL PI IND STRL 7.5 (GLOVE) ×1 IMPLANT
GLOVE BIOGEL PI IND STRL 8 (GLOVE) ×1 IMPLANT
GLOVE SURG ORTHO LTX SZ7.5 (GLOVE) ×2 IMPLANT
GOWN STRL REUS W/ TWL LRG LVL3 (GOWN DISPOSABLE) ×2 IMPLANT
GOWN STRL REUS W/ TWL XL LVL3 (GOWN DISPOSABLE) ×1 IMPLANT
GOWN STRL REUS W/TWL LRG LVL3 (GOWN DISPOSABLE) ×2
GOWN STRL REUS W/TWL XL LVL3 (GOWN DISPOSABLE) ×1
KIT BASIN OR (CUSTOM PROCEDURE TRAY) ×1 IMPLANT
KIT TURNOVER KIT B (KITS) ×1 IMPLANT
MANIFOLD NEPTUNE II (INSTRUMENTS) ×1 IMPLANT
NDL 22X1.5 STRL (OR ONLY) (MISCELLANEOUS) IMPLANT
NEEDLE 22X1.5 STRL (OR ONLY) (MISCELLANEOUS) IMPLANT
NS IRRIG 1000ML POUR BTL (IV SOLUTION) ×1 IMPLANT
PACK ORTHO EXTREMITY (CUSTOM PROCEDURE TRAY) ×1 IMPLANT
PAD ARMBOARD 7.5X6 YLW CONV (MISCELLANEOUS) ×2 IMPLANT
PADDING CAST COTTON 6X4 STRL (CAST SUPPLIES) ×3 IMPLANT
SPONGE T-LAP 18X18 ~~LOC~~+RFID (SPONGE) ×1 IMPLANT
STAPLER VISISTAT 35W (STAPLE) IMPLANT
STOCKINETTE IMPERVIOUS LG (DRAPES) ×1 IMPLANT
STRIP CLOSURE SKIN 1/2X4 (GAUZE/BANDAGES/DRESSINGS) IMPLANT
SUCTION TUBE FRAZIER 10FR DISP (SUCTIONS) IMPLANT
SUT ETHILON 2 0 FS 18 (SUTURE) IMPLANT
SUT PDS AB 2-0 CT1 27 (SUTURE) IMPLANT
SUT VIC AB 0 CT1 27 (SUTURE)
SUT VIC AB 0 CT1 27XBRD ANBCTR (SUTURE) IMPLANT
SUT VIC AB 2-0 CT1 27 (SUTURE)
SUT VIC AB 2-0 CT1 TAPERPNT 27 (SUTURE) IMPLANT
SYR CONTROL 10ML LL (SYRINGE) IMPLANT
TOWEL GREEN STERILE (TOWEL DISPOSABLE) ×2 IMPLANT
TOWEL GREEN STERILE FF (TOWEL DISPOSABLE) ×2 IMPLANT
TUBE CONNECTING 12X1/4 (SUCTIONS) ×1 IMPLANT
TUBE CONNECTING 20X1/4 (TUBING) IMPLANT
UNDERPAD 30X36 HEAVY ABSORB (UNDERPADS AND DIAPERS) ×1 IMPLANT
WATER STERILE IRR 1000ML POUR (IV SOLUTION) ×2 IMPLANT
YANKAUER SUCT BULB TIP NO VENT (SUCTIONS) ×1 IMPLANT

## 2023-04-11 NOTE — Op Note (Signed)
06/28/2019  10:22 AM  PATIENT:  Laura Bush  72 y.o. female  PRE-OPERATIVE DIAGNOSIS:   1. SYMPTOMATIC HARDWARE RIGHT FEMUR 2. SYMPTOMATIC HETEROTOPIC OSSIFICATION RIGHT THIGH  POST-OPERATIVE DIAGNOSIS:   1. SYMPTOMATIC HARDWARE RIGHT FEMUR 2. SYMPTOMATIC HETEROTOPIC OSSIFICATION RIGHT THIGH  PROCEDURES:   1. DEEP HARDWARE REMOVAL RIGHT FEMUR 2. BENIGN BONE MASS EXCISION DEEP RIGHT THIGH  SURGEON:  Surgeon(s) and Role:    Myrene Galas, MD - Primary  PHYSICIAN ASSISTANT: None.  ANESTHESIA:   general  I/O:  Total I/O In: 750 [I.V.:650; IV Piggyback:100] Out: 25 [Blood:25]  SPECIMEN:  None  TOURNIQUET:  * No tourniquets in log *  DICTATION: Epic  DISPOSITION: PACU  CONDITION: Stable  BRIEF SUMMARY OF INDICATION FOR PROCEDURE:  Patient is a pleasant 72 y.o. who underwent surgical repair of right femur fracture with subsequent healing and also the development of some heterotopic bone. Despite conservative measures, hardware and ossification related symptoms have persisted. Therefore, I discussed with the patient the risks and benefits of surgical removal including infection, nerve or vessel injury, failure to alleviate symptoms, occult nonunion, re-fracture, DVT, PE, and multiple others. Consent was provided to proceed.   BRIEF SUMMARY OF PROCEDURE:  The patient was taken to the operating room after administration of 2 g of Ancef.  General anesthesia was induced. The operative extremity was prepped and draped in usual sterile fashion.  No tourniquet was used during the procedure.  C-arm was brought in to confirm position of the hardware.  I remade the old incision used for insertion of one of the locking bolts and extended it proximally. C-arm was brought in to identify the heterotopic ossification (HO). I made a midline exposure through the vastus lateralis fascia of this deep bone mass which measured 2 x 3 x 4 cm at its farthest points. I used electrocautery to work  around [employer] as well as an Engineer, structural. I was able to remove it in its entirety and confirm with fluoro.  Next attention was turned to the locking bolts, two of which were partially grown over. I was able to finally localize on the lateral and then to engage the heads with the screwdriver and remove the locking bolts without complication. Final x-rays confirmed removal of all hardware and a healed fracture. The wounds were irrigated thoroughly and closed in standard fashion with nylon. A sterile gently compressive dressing was applied.  The patient was taken to the PACU in stable condition.    PROGNOSIS: Patient will be weightbearing as tolerated with aggressive active and passive motion of the knee and ankle. Bleeding would be anticipated. The dressing may be changed or removed in 48 hours and shower. Patient will follow up in 10 days for removal of sutures.       Doralee Albino. Carola Frost, M.D.

## 2023-04-11 NOTE — Transfer of Care (Signed)
Immediate Anesthesia Transfer of Care Note  Patient: Laura Bush  Procedure(s) Performed: HARDWARE REMOVAL (Right: Leg Lower) BONE EXCISION (Right: Leg Lower)  Patient Location: PACU  Anesthesia Type:General  Level of Consciousness: drowsy and patient cooperative  Airway & Oxygen Therapy: Patient Spontanous Breathing and Patient connected to nasal cannula oxygen  Post-op Assessment: Report given to RN and Post -op Vital signs reviewed and stable  Post vital signs: Reviewed and stable  Last Vitals:  Vitals Value Taken Time  BP 133/67 04/11/23 1007  Temp    Pulse 69 04/11/23 1008  Resp 10 04/11/23 1008  SpO2 99 % 04/11/23 1008  Vitals shown include unfiled device data.  Last Pain:  Vitals:   04/11/23 0656  PainSc: 4       Patients Stated Pain Goal: 2 (04/11/23 0656)  Complications: No notable events documented.

## 2023-04-11 NOTE — Anesthesia Postprocedure Evaluation (Signed)
Anesthesia Post Note  Patient: Laura Bush  Procedure(s) Performed: HARDWARE REMOVAL (Right: Leg Lower) BONE EXCISION (Right: Leg Lower)     Patient location during evaluation: PACU Anesthesia Type: General Level of consciousness: awake Pain management: pain level controlled Vital Signs Assessment: post-procedure vital signs reviewed and stable Respiratory status: spontaneous breathing, nonlabored ventilation and respiratory function stable Cardiovascular status: blood pressure returned to baseline and stable Postop Assessment: no apparent nausea or vomiting Anesthetic complications: no   No notable events documented.  Last Vitals:  Vitals:   04/11/23 1015 04/11/23 1030  BP: 128/60 (!) 141/67  Pulse: (!) 59 (!) 50  Resp: 13 13  Temp:  36.4 C  SpO2: 96% 99%    Last Pain:  Vitals:   04/11/23 1030  PainSc: 4                  Linton Rump

## 2023-04-11 NOTE — Anesthesia Procedure Notes (Signed)
Procedure Name: Intubation Date/Time: 04/11/2023 8:59 AM  Performed by: Waynard Edwards, CRNAPre-anesthesia Checklist: Patient identified, Emergency Drugs available, Suction available and Patient being monitored Patient Re-evaluated:Patient Re-evaluated prior to induction Oxygen Delivery Method: Circle system utilized Preoxygenation: Pre-oxygenation with 100% oxygen Induction Type: IV induction Ventilation: Mask ventilation without difficulty Laryngoscope Size: Miller and 2 Grade View: Grade I Tube type: Oral Tube size: 7.0 mm Number of attempts: 1 Airway Equipment and Method: Stylet Placement Confirmation: ETT inserted through vocal cords under direct vision, positive ETCO2 and breath sounds checked- equal and bilateral Secured at: 21 cm Tube secured with: Tape Dental Injury: Teeth and Oropharynx as per pre-operative assessment

## 2023-04-11 NOTE — Discharge Instructions (Signed)
Orthopaedic Trauma Service Discharge Instructions   General Discharge Instructions   WEIGHT BEARING STATUS: Weightbearing as tolerated right leg.  May need to use walker for a day or 2.  RANGE OF MOTION/ACTIVITY: No motion restrictions right knee.  Activity as tolerated.  Slowly increase activity level  Wound Care: Daily wound care starting on 04/13/2023.  Can use a silicone foam dressing or 4 x 4 gauze and tape.  Okay to shower and clean with soap and water only once there is no drainage from the wounds.   Diet: as you were eating previously.  Can use over the counter stool softeners and bowel preparations, such as Miralax, to help with bowel movements.  Narcotics can be constipating.  Be sure to drink plenty of fluids  PAIN MEDICATION USE AND EXPECTATIONS  You have likely been given narcotic medications to help control your pain.  After a traumatic event that results in an fracture (broken bone) with or without surgery, it is ok to use narcotic pain medications to help control one's pain.  We understand that everyone responds to pain differently and each individual patient will be evaluated on a regular basis for the continued need for narcotic medications. Ideally, narcotic medication use should last no more than 6-8 weeks (coinciding with fracture healing).   As a patient it is your responsibility as well to monitor narcotic medication use and report the amount and frequency you use these medications when you come to your office visit.   We would also advise that if you are using narcotic medications, you should take a dose prior to therapy to maximize you participation.  IF YOU ARE ON NARCOTIC MEDICATIONS IT IS NOT PERMISSIBLE TO OPERATE A MOTOR VEHICLE (MOTORCYCLE/CAR/TRUCK/MOPED) OR HEAVY MACHINERY DO NOT MIX NARCOTICS WITH OTHER CNS (CENTRAL NERVOUS SYSTEM) DEPRESSANTS SUCH AS ALCOHOL   POST-OPERATIVE OPIOID TAPER INSTRUCTIONS: It is important to wean off of your opioid medication  as soon as possible. If you do not need pain medication after your surgery it is ok to stop day one. Opioids include: Codeine, Hydrocodone(Norco, Vicodin), Oxycodone(Percocet, oxycontin) and hydromorphone amongst others.  Long term and even short term use of opiods can cause: Increased pain response Dependence Constipation Depression Respiratory depression And more.  Withdrawal symptoms can include Flu like symptoms Nausea, vomiting And more Techniques to manage these symptoms Hydrate well Eat regular healthy meals Stay active Use relaxation techniques(deep breathing, meditating, yoga) Do Not substitute Alcohol to help with tapering If you have been on opioids for less than two weeks and do not have pain than it is ok to stop all together.  Plan to wean off of opioids This plan should start within one week post op of your fracture surgery  Maintain the same interval or time between taking each dose and first decrease the dose.  Cut the total daily intake of opioids by one tablet each day Next start to increase the time between doses. The last dose that should be eliminated is the evening dose.    STOP SMOKING OR USING NICOTINE PRODUCTS!!!!  As discussed nicotine severely impairs your body's ability to heal surgical and traumatic wounds but also impairs bone healing.  Wounds and bone heal by forming microscopic blood vessels (angiogenesis) and nicotine is a vasoconstrictor (essentially, shrinks blood vessels).  Therefore, if vasoconstriction occurs to these microscopic blood vessels they essentially disappear and are unable to deliver necessary nutrients to the healing tissue.  This is one modifiable factor that you can do to  dramatically increase your chances of healing your injury.    (This means no smoking, no nicotine gum, patches, etc)  DO NOT USE NONSTEROIDAL ANTI-INFLAMMATORY DRUGS (NSAID'S)  Using products such as Advil (ibuprofen), Aleve (naproxen), Motrin (ibuprofen) for  additional pain control during fracture healing can delay and/or prevent the healing response.  If you would like to take over the counter (OTC) medication, Tylenol (acetaminophen) is ok.  However, some narcotic medications that are given for pain control contain acetaminophen as well. Therefore, you should not exceed more than 4000 mg of tylenol in a day if you do not have liver disease.  Also note that there are may OTC medicines, such as cold medicines and allergy medicines that my contain tylenol as well.  If you have any questions about medications and/or interactions please ask your doctor/PA or your pharmacist.      ICE AND ELEVATE INJURED/OPERATIVE EXTREMITY  Using ice and elevating the injured extremity above your heart can help with swelling and pain control.  Icing in a pulsatile fashion, such as 20 minutes on and 20 minutes off, can be followed.    Do not place ice directly on skin. Make sure there is a barrier between to skin and the ice pack.    Using frozen items such as frozen peas works well as the conform nicely to the are that needs to be iced.  USE AN ACE WRAP OR TED HOSE FOR SWELLING CONTROL  In addition to icing and elevation, Ace wraps or TED hose are used to help limit and resolve swelling.  It is recommended to use Ace wraps or TED hose until you are informed to stop.    When using Ace Wraps start the wrapping distally (farthest away from the body) and wrap proximally (closer to the body)   Example: If you had surgery on your leg or thing and you do not have a splint on, start the ace wrap at the toes and work your way up to the thigh        If you had surgery on your upper extremity and do not have a splint on, start the ace wrap at your fingers and work your way up to the upper arm  IF YOU ARE IN A SPLINT OR CAST DO NOT REMOVE IT FOR ANY REASON   If your splint gets wet for any reason please contact the office immediately. You may shower in your splint or cast as long as you  keep it dry.  This can be done by wrapping in a cast cover or garbage back (or similar)  Do Not stick any thing down your splint or cast such as pencils, money, or hangers to try and scratch yourself with.  If you feel itchy take benadryl as prescribed on the bottle for itching  IF YOU ARE IN A CAM BOOT (BLACK BOOT)  You may remove boot periodically. Perform daily dressing changes as noted below.  Wash the liner of the boot regularly and wear a sock when wearing the boot. It is recommended that you sleep in the boot until told otherwise    Call office for the following: Temperature greater than 101F Persistent nausea and vomiting Severe uncontrolled pain Redness, tenderness, or signs of infection (pain, swelling, redness, odor or green/yellow discharge around the site) Difficulty breathing, headache or visual disturbances Hives Persistent dizziness or light-headedness Extreme fatigue Any other questions or concerns you may have after discharge  In an emergency, call 911 or go to  an Emergency Department at a nearby hospital  HELPFUL INFORMATION  If you had a block, it will wear off between 8-24 hrs postop typically.  This is period when your pain may go from nearly zero to the pain you would have had postop without the block.  This is an abrupt transition but nothing dangerous is happening.  You may take an extra dose of narcotic when this happens.  You should wean off your narcotic medicines as soon as you are able.  Most patients will be off or using minimal narcotics before their first postop appointment.   We suggest you use the pain medication the first night prior to going to bed, in order to ease any pain when the anesthesia wears off. You should avoid taking pain medications on an empty stomach as it will make you nauseous.  Do not drink alcoholic beverages or take illicit drugs when taking pain medications.  In most states it is against the law to drive while you are in a  splint or sling.  And certainly against the law to drive while taking narcotics.  You may return to work/school in the next couple of days when you feel up to it.   Pain medication may make you constipated.  Below are a few solutions to try in this order: Decrease the amount of pain medication if you aren't having pain. Drink lots of decaffeinated fluids. Drink prune juice and/or each dried prunes  If the first 3 don't work start with additional solutions Take Colace - an over-the-counter stool softener Take Senokot - an over-the-counter laxative Take Miralax - a stronger over-the-counter laxative     CALL THE OFFICE WITH ANY QUESTIONS OR CONCERNS: [phone]   VISIT OUR WEBSITE FOR ADDITIONAL INFORMATION: [email or web address]

## 2023-04-12 ENCOUNTER — Encounter (HOSPITAL_COMMUNITY): Payer: Self-pay | Admitting: Orthopedic Surgery

## 2023-04-15 ENCOUNTER — Encounter (INDEPENDENT_AMBULATORY_CARE_PROVIDER_SITE_OTHER): Payer: Medicare HMO | Admitting: Family Medicine

## 2023-04-15 DIAGNOSIS — J0110 Acute frontal sinusitis, unspecified: Secondary | ICD-10-CM

## 2023-04-16 DIAGNOSIS — J0110 Acute frontal sinusitis, unspecified: Secondary | ICD-10-CM

## 2023-04-16 MED ORDER — AMOXICILLIN 500 MG PO CAPS
1000.0000 mg | ORAL_CAPSULE | Freq: Two times a day (BID) | ORAL | 0 refills | Status: DC
Start: 2023-04-16 — End: 2023-05-04

## 2023-04-16 NOTE — Telephone Encounter (Signed)
Please see the MyChart message reply(ies) for my assessment and plan.  The patient gave consent for this Medical Advice Message and is aware that it may result in a bill to their insurance company as well as the possibility that this may result in a co-payment or deductible. They are an established patient, but are not seeking medical advice exclusively about a problem treated during an in person or video visit in the last 7 days. I did not recommend an in person or video visit within 7 days of my reply.  I spent a total of 10 minutes cumulative time within 7 days through MyChart messaging Jessica Copland, MD  

## 2023-04-18 ENCOUNTER — Encounter: Payer: Self-pay | Admitting: Hematology & Oncology

## 2023-04-18 ENCOUNTER — Inpatient Hospital Stay: Payer: Medicare HMO | Attending: Hematology & Oncology

## 2023-04-18 DIAGNOSIS — D631 Anemia in chronic kidney disease: Secondary | ICD-10-CM

## 2023-04-18 DIAGNOSIS — N183 Chronic kidney disease, stage 3 unspecified: Secondary | ICD-10-CM | POA: Insufficient documentation

## 2023-04-18 DIAGNOSIS — C3490 Malignant neoplasm of unspecified part of unspecified bronchus or lung: Secondary | ICD-10-CM

## 2023-04-18 HISTORY — DX: Anemia in chronic kidney disease: D63.1

## 2023-04-18 LAB — CBC WITH DIFFERENTIAL (CANCER CENTER ONLY)
Abs Immature Granulocytes: 0.03 10*3/uL (ref 0.00–0.07)
Basophils Absolute: 0 10*3/uL (ref 0.0–0.1)
Basophils Relative: 1 %
Eosinophils Absolute: 0.1 10*3/uL (ref 0.0–0.5)
Eosinophils Relative: 3 %
HCT: 28.8 % — ABNORMAL LOW (ref 36.0–46.0)
Hemoglobin: 9.9 g/dL — ABNORMAL LOW (ref 12.0–15.0)
Immature Granulocytes: 1 %
Lymphocytes Relative: 17 %
Lymphs Abs: 0.7 10*3/uL (ref 0.7–4.0)
MCH: 36 pg — ABNORMAL HIGH (ref 26.0–34.0)
MCHC: 34.4 g/dL (ref 30.0–36.0)
MCV: 104.7 fL — ABNORMAL HIGH (ref 80.0–100.0)
Monocytes Absolute: 0.5 10*3/uL (ref 0.1–1.0)
Monocytes Relative: 13 %
Neutro Abs: 2.7 10*3/uL (ref 1.7–7.7)
Neutrophils Relative %: 65 %
Platelet Count: 178 10*3/uL (ref 150–400)
RBC: 2.75 MIL/uL — ABNORMAL LOW (ref 3.87–5.11)
RDW: 13.6 % (ref 11.5–15.5)
WBC Count: 4.2 10*3/uL (ref 4.0–10.5)
nRBC: 0 % (ref 0.0–0.2)

## 2023-04-18 LAB — BASIC METABOLIC PANEL - CANCER CENTER ONLY
Anion gap: 9 (ref 5–15)
BUN: 18 mg/dL (ref 8–23)
CO2: 29 mmol/L (ref 22–32)
Calcium: 10 mg/dL (ref 8.9–10.3)
Chloride: 103 mmol/L (ref 98–111)
Creatinine: 0.98 mg/dL (ref 0.44–1.00)
GFR, Estimated: >60 mL/min (ref >60)
Glucose, Bld: 95 mg/dL (ref 70–99)
Potassium: 4.1 mmol/L (ref 3.5–5.1)
Sodium: 141 mmol/L (ref 135–145)

## 2023-04-18 NOTE — Addendum Note (Signed)
Addended by: Arlan Organ R on: 04/18/2023 02:24 PM   Modules accepted: Orders

## 2023-04-19 DIAGNOSIS — S72301D Unspecified fracture of shaft of right femur, subsequent encounter for closed fracture with routine healing: Secondary | ICD-10-CM | POA: Diagnosis not present

## 2023-04-19 DIAGNOSIS — T8484XD Pain due to internal orthopedic prosthetic devices, implants and grafts, subsequent encounter: Secondary | ICD-10-CM | POA: Diagnosis not present

## 2023-04-20 ENCOUNTER — Other Ambulatory Visit: Payer: Self-pay | Admitting: *Deleted

## 2023-04-20 DIAGNOSIS — N183 Anemia in chronic kidney disease: Secondary | ICD-10-CM

## 2023-04-21 ENCOUNTER — Inpatient Hospital Stay: Payer: Medicare HMO

## 2023-04-21 VITALS — BP 125/59 | HR 75 | Temp 99.3°F | Resp 18

## 2023-04-21 DIAGNOSIS — D631 Anemia in chronic kidney disease: Secondary | ICD-10-CM | POA: Diagnosis not present

## 2023-04-21 DIAGNOSIS — N183 Chronic kidney disease, stage 3 unspecified: Secondary | ICD-10-CM | POA: Diagnosis not present

## 2023-04-21 DIAGNOSIS — C3490 Malignant neoplasm of unspecified part of unspecified bronchus or lung: Secondary | ICD-10-CM

## 2023-04-21 LAB — CMP (CANCER CENTER ONLY)
ALT: 32 U/L (ref 0–44)
AST: 25 U/L (ref 15–41)
Albumin: 4.2 g/dL (ref 3.5–5.0)
Alkaline Phosphatase: 55 U/L (ref 38–126)
Anion gap: 7 (ref 5–15)
BUN: 18 mg/dL (ref 8–23)
CO2: 29 mmol/L (ref 22–32)
Calcium: 10.2 mg/dL (ref 8.9–10.3)
Chloride: 105 mmol/L (ref 98–111)
Creatinine: 0.94 mg/dL (ref 0.44–1.00)
GFR, Estimated: >60 mL/min (ref >60)
Glucose, Bld: 93 mg/dL (ref 70–99)
Potassium: 4.6 mmol/L (ref 3.5–5.1)
Sodium: 141 mmol/L (ref 135–145)
Total Bilirubin: 0.4 mg/dL (ref 0.3–1.2)
Total Protein: 6.9 g/dL (ref 6.5–8.1)

## 2023-04-21 LAB — CBC WITH DIFFERENTIAL (CANCER CENTER ONLY)
Abs Immature Granulocytes: 0.02 10*3/uL (ref 0.00–0.07)
Basophils Absolute: 0 10*3/uL (ref 0.0–0.1)
Basophils Relative: 1 %
Eosinophils Absolute: 0.1 10*3/uL (ref 0.0–0.5)
Eosinophils Relative: 3 %
HCT: 29.8 % — ABNORMAL LOW (ref 36.0–46.0)
Hemoglobin: 10.1 g/dL — ABNORMAL LOW (ref 12.0–15.0)
Immature Granulocytes: 1 %
Lymphocytes Relative: 17 %
Lymphs Abs: 0.6 10*3/uL — ABNORMAL LOW (ref 0.7–4.0)
MCH: 35.7 pg — ABNORMAL HIGH (ref 26.0–34.0)
MCHC: 33.9 g/dL (ref 30.0–36.0)
MCV: 105.3 fL — ABNORMAL HIGH (ref 80.0–100.0)
Monocytes Absolute: 0.5 10*3/uL (ref 0.1–1.0)
Monocytes Relative: 13 %
Neutro Abs: 2.5 10*3/uL (ref 1.7–7.7)
Neutrophils Relative %: 65 %
Platelet Count: 196 10*3/uL (ref 150–400)
RBC: 2.83 MIL/uL — ABNORMAL LOW (ref 3.87–5.11)
RDW: 13.4 % (ref 11.5–15.5)
WBC Count: 3.8 10*3/uL — ABNORMAL LOW (ref 4.0–10.5)
nRBC: 0 % (ref 0.0–0.2)

## 2023-04-21 MED ORDER — DARBEPOETIN ALFA 300 MCG/0.6ML IJ SOSY
300.0000 ug | PREFILLED_SYRINGE | Freq: Once | INTRAMUSCULAR | Status: AC
  Administered 2023-04-21: 300 ug via SUBCUTANEOUS
  Filled 2023-04-21: qty 0.6

## 2023-04-21 NOTE — Patient Instructions (Signed)

## 2023-04-24 ENCOUNTER — Telehealth: Payer: Self-pay

## 2023-04-24 ENCOUNTER — Other Ambulatory Visit: Payer: Self-pay | Admitting: Hematology & Oncology

## 2023-04-24 NOTE — Telephone Encounter (Signed)
Returned her call from message over the weekend; she is having to adjust some of her chemo/treatment meds and wants to get that straightened out before starting PREP, I agreed; she plans to reach out to me when she is ready to begin the program.

## 2023-05-04 ENCOUNTER — Encounter: Payer: Self-pay | Admitting: Hematology & Oncology

## 2023-05-04 ENCOUNTER — Encounter: Payer: Self-pay | Admitting: *Deleted

## 2023-05-04 ENCOUNTER — Inpatient Hospital Stay: Payer: Medicare HMO

## 2023-05-04 ENCOUNTER — Other Ambulatory Visit: Payer: Self-pay

## 2023-05-04 ENCOUNTER — Inpatient Hospital Stay: Payer: Medicare HMO | Admitting: Hematology & Oncology

## 2023-05-04 VITALS — BP 126/58 | HR 60 | Temp 98.3°F | Resp 16 | Ht <= 58 in | Wt 172.0 lb

## 2023-05-04 DIAGNOSIS — C3490 Malignant neoplasm of unspecified part of unspecified bronchus or lung: Secondary | ICD-10-CM | POA: Diagnosis not present

## 2023-05-04 DIAGNOSIS — N183 Chronic kidney disease, stage 3 unspecified: Secondary | ICD-10-CM | POA: Diagnosis not present

## 2023-05-04 DIAGNOSIS — D631 Anemia in chronic kidney disease: Secondary | ICD-10-CM | POA: Diagnosis not present

## 2023-05-04 DIAGNOSIS — C7951 Secondary malignant neoplasm of bone: Secondary | ICD-10-CM | POA: Diagnosis not present

## 2023-05-04 LAB — CBC WITH DIFFERENTIAL (CANCER CENTER ONLY)
Abs Immature Granulocytes: 0.01 10*3/uL (ref 0.00–0.07)
Basophils Absolute: 0 10*3/uL (ref 0.0–0.1)
Basophils Relative: 0 %
Eosinophils Absolute: 0.1 10*3/uL (ref 0.0–0.5)
Eosinophils Relative: 4 %
HCT: 33.9 % — ABNORMAL LOW (ref 36.0–46.0)
Hemoglobin: 11.2 g/dL — ABNORMAL LOW (ref 12.0–15.0)
Immature Granulocytes: 0 %
Lymphocytes Relative: 22 %
Lymphs Abs: 0.5 10*3/uL — ABNORMAL LOW (ref 0.7–4.0)
MCH: 33.7 pg (ref 26.0–34.0)
MCHC: 33 g/dL (ref 30.0–36.0)
MCV: 102.1 fL — ABNORMAL HIGH (ref 80.0–100.0)
Monocytes Absolute: 0.3 10*3/uL (ref 0.1–1.0)
Monocytes Relative: 13 %
Neutro Abs: 1.5 10*3/uL — ABNORMAL LOW (ref 1.7–7.7)
Neutrophils Relative %: 61 %
Platelet Count: 291 10*3/uL (ref 150–400)
RBC: 3.32 MIL/uL — ABNORMAL LOW (ref 3.87–5.11)
RDW: 13.4 % (ref 11.5–15.5)
WBC Count: 2.5 10*3/uL — ABNORMAL LOW (ref 4.0–10.5)
nRBC: 0 % (ref 0.0–0.2)

## 2023-05-04 LAB — CMP (CANCER CENTER ONLY)
ALT: 61 U/L — ABNORMAL HIGH (ref 0–44)
AST: 42 U/L — ABNORMAL HIGH (ref 15–41)
Albumin: 4.1 g/dL (ref 3.5–5.0)
Alkaline Phosphatase: 55 U/L (ref 38–126)
Anion gap: 8 (ref 5–15)
BUN: 15 mg/dL (ref 8–23)
CO2: 29 mmol/L (ref 22–32)
Calcium: 9.5 mg/dL (ref 8.9–10.3)
Chloride: 101 mmol/L (ref 98–111)
Creatinine: 0.95 mg/dL (ref 0.44–1.00)
GFR, Estimated: >60 mL/min (ref >60)
Glucose, Bld: 95 mg/dL (ref 70–99)
Potassium: 3.6 mmol/L (ref 3.5–5.1)
Sodium: 138 mmol/L (ref 135–145)
Total Bilirubin: 0.5 mg/dL (ref 0.3–1.2)
Total Protein: 6.7 g/dL (ref 6.5–8.1)

## 2023-05-04 LAB — LACTATE DEHYDROGENASE: LDH: 180 U/L (ref 98–192)

## 2023-05-04 NOTE — Progress Notes (Signed)
Patient's renal function has improved. Patient will restart her Alecensa.   Patient has not have navigational needs for sometime and is on maintenance treatment. As such, I will discontinue active navigation at this time, but be available as needed in the future.   Oncology Nurse Navigator Documentation     05/04/2023   10:30 AM  Oncology Nurse Navigator Flowsheets  Navigation Complete Date: 05/04/2023  Post Navigation: Continue to Follow Patient? No  Reason Not Navigating Patient: Patient On Maintenance Chemotherapy  Navigator Location [facility]  Navigator Encounter Type Appt/Treatment Plan Review  Patient Visit Type MedOnc  Treatment Phase Active Tx  Barriers/Navigation Needs No Barriers At This Time  Interventions None Required  Acuity Level 1-No Barriers  Support Groups/Services Friends and Family  Time Spent with Patient 15

## 2023-05-04 NOTE — Progress Notes (Signed)
Hematology and Oncology Follow Up Visit  Laura Bush [id number] Dec 20, 1950 72 y.o. 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 3 months  -next dose in 05/2023  S/P SBRT to LUL -- completed on 03/04/2022 S/P XRT to RIGHT femur -- completed on 03/04/2022 Alecensa 600 mg po BID -- start on 03/24/2022 --on hold starting 04/04/2023 -restart 3 mg p.o. twice daily on 05/04/2023 Aranesp 300 mcg subcu every 3 weeks     Interim History:  [name] is back for follow-up.  She is doing a lot better.  Her kidneys are doing a whole lot better.  So not sure exactly what her kidney function had gone down.  Because of the lower kidney function, she became anemic.  We got her on Aranesp.  She has responded very well to the Aranesp.  She feels a lot better with her oral hygiene now.  She does not feel as if her gum lining is sloughing off.  It is certainly possible that the Alecensa was doing all of this.  I would like to get her back on Alecensa but we will try her at half dose.  We will try her at 300 mg p.o. twice daily.  She has had no problem with rashes.  There is been no leg swelling.  She has had no cough.  The cough that she had before is much better now.  There is no headache.  Overall, I would say that her performance status is probably ECOG 1.    Medications:  Current Outpatient Medications:    acetaminophen (TYLENOL) 500 MG tablet, Take 1 tablet (500 mg total) by mouth every 8 (eight) hours as needed for mild pain or moderate pain., Disp: 30 tablet, Rfl: 0   alectinib (ALECENSA) 150 MG capsule, Take 4 capsules (600 mg total) by mouth 2 (two) times daily with a meal. (Patient not taking: Reported on 04/06/2023), Disp: 240 capsule, Rfl: 6   aMILoride (MIDAMOR) 5 MG tablet, TAKE  ONE TABLET BY MOUTH DAILY, Disp: 90 tablet, Rfl: 3   aspirin EC 81 MG tablet, Take 81 mg by mouth in the morning. Swallow whole., Disp: , Rfl:    B Complex-C (SUPER B COMPLEX PO), Take 1 tablet by mouth daily with breakfast., Disp: , Rfl:    Calcium Carb-Cholecalciferol (CALCIUM + D3 PO), Take 1 tablet by mouth in the morning., Disp: , Rfl:    cholecalciferol (VITAMIN D3) 25 MCG (1000 UNIT) tablet, Take 1,000 Units by mouth in the morning., Disp: , Rfl:    COLLAGEN PO, Take 3 tablets by mouth in the morning and at bedtime. With Biotin, Disp: , Rfl:    Denosumab (XGEVA [place]), Inject into the skin every 3 (three) months., Disp: , Rfl:    fexofenadine (ALLEGRA) 180 MG tablet, Take 180 mg by mouth in the morning., Disp: , Rfl:    folic acid (FOLATE) 400 MCG tablet, Take 400 mcg by mouth in the morning., Disp: , Rfl:    furosemide (LASIX) 20 MG tablet, TAKE 1 TABLET BY MOUTH DAILY AS NEEDED, Disp: 30 tablet, Rfl: 3   hydrochlorothiazide (HYDRODIURIL) 25 MG tablet, Take 1 tablet (25 mg total) by mouth daily., Disp: 90 tablet, Rfl: 1   ipratropium (ATROVENT) 0.06 % nasal spray, Place 2 sprays into both nostrils 4 (  four) times daily as needed for rhinitis., Disp: 15 mL, Rfl: 4   metFORMIN (GLUCOPHAGE) 500 MG tablet, TAKE TWO TABELTS AT LUNCH AND ONE TABLET AT DINNER (Patient taking differently: Take 1,000 mg by mouth daily at 12 noon.), Disp: 270 tablet, Rfl: 0   pantoprazole (PROTONIX) 40 MG tablet, TAKE ONE TABLET BY MOUTH DAILY (Patient taking differently: Take 40 mg by mouth daily as needed (indigestion/heartburn.).), Disp: 90 tablet, Rfl: 3   polyethylene glycol (MIRALAX / GLYCOLAX) 17 g packet, Take 17 g by mouth daily as needed (constipation)., Disp: , Rfl:    pravastatin (PRAVACHOL) 20 MG tablet, Take 1 tablet (20 mg total) by mouth 3 (three) times a week. (Patient taking differently: Take 20 mg by mouth every Monday, Wednesday, and Friday at 8 PM.), Disp: 40 tablet, Rfl: 3   traZODone (DESYREL) 50 MG  tablet, TAKE 1 TABLET BY MOUTH EVERY NIGHT AT BEDTIME AS NEEDED FOR SLEEP (Patient taking differently: Take 50 mg by mouth at bedtime. for sleep), Disp: 90 tablet, Rfl: 2   valsartan (DIOVAN) 80 MG tablet, Take 1 tablet (80 mg total) by mouth daily., Disp: 90 tablet, Rfl: 3  Allergies:  Allergies  Allergen Reactions   Diclofenac Hypertension   Amlodipine Swelling and Other (See Comments)    Limbs swell, not the throat   Lanolin Other (See Comments)    Sneezing and watery eyes. Allergic to [facility].   Tape Other (See Comments)    Redness (Bandaids also)    Past Medical History, Surgical history, Social history, and Family History were reviewed and updated.  Review of Systems: Review of Systems  Constitutional: Negative.   HENT:  Negative.    Eyes: Negative.   Respiratory: Negative.    Cardiovascular: Negative.   Gastrointestinal: Negative.   Endocrine: Negative.   Genitourinary: Negative.    Musculoskeletal:  Positive for arthralgias.  Skin: Negative.   Neurological: Negative.   Hematological: Negative.   Psychiatric/Behavioral: Negative.      Physical Exam:  height is 4\' 10"  (1.473 m) and weight is 172 lb (78 kg). Her oral temperature is 98.3 F (36.8 C). Her blood pressure is 126/58 (abnormal) and her pulse is 60. Her respiration is 16 and oxygen saturation is 99%.   Wt Readings from Last 3 Encounters:  05/04/23 172 lb (78 kg)  04/11/23 172 lb (78 kg)  04/04/23 177 lb (80.3 kg)    Physical Exam Vitals reviewed.  HENT:     Head: Normocephalic and atraumatic.  Eyes:     Pupils: Pupils are equal, round, and reactive to light.  Cardiovascular:     Rate and Rhythm: Normal rate and regular rhythm.     Heart sounds: Normal heart sounds.  Pulmonary:     Effort: Pulmonary effort is normal.     Breath sounds: Normal breath sounds.  Abdominal:     General: Bowel sounds are normal.     Palpations: Abdomen is soft.  Musculoskeletal:        General: No tenderness or  deformity. Normal range of motion.     Cervical back: Normal range of motion.     Comments: She has about 2+ edema in the lower legs.  I cannot palpate any venous cord.  She has a negative Homans' sign.  Lymphadenopathy:     Cervical: No cervical adenopathy.  Skin:    General: Skin is warm and dry.     Findings: No erythema or rash.  Neurological:     Mental Status: She  is alert and oriented to person, place, and time.  Psychiatric:        Behavior: Behavior normal.        Thought Content: Thought content normal.        Judgment: Judgment normal.     Lab Results  Component Value Date   WBC 2.5 (L) 05/04/2023   HGB 11.2 (L) 05/04/2023   HCT 33.9 (L) 05/04/2023   MCV 102.1 (H) 05/04/2023   PLT 291 05/04/2023     Chemistry      Component Value Date/Time   NA 138 05/04/2023 0948   NA 141 12/27/2022 0812   NA 142 05/29/2017 1002   K 3.6 05/04/2023 0948   K 3.9 05/29/2017 1002   CL 101 05/04/2023 0948   CO2 29 05/04/2023 0948   CO2 28 05/29/2017 1002   BUN 15 05/04/2023 0948   BUN 28 (H) 12/27/2022 0812   BUN 13.5 05/29/2017 1002   CREATININE 0.95 05/04/2023 0948   CREATININE 0.81 04/06/2020 1059   CREATININE 0.8 05/29/2017 1002      Component Value Date/Time   CALCIUM 9.5 05/04/2023 0948   CALCIUM 9.4 05/29/2017 1002   ALKPHOS 55 05/04/2023 0948   ALKPHOS 39 (L) 05/29/2017 1002   AST 42 (H) 05/04/2023 0948   AST 18 05/29/2017 1002   ALT 61 (H) 05/04/2023 0948   ALT 19 05/29/2017 1002   BILITOT 0.5 05/04/2023 0948   BILITOT 0.48 05/29/2017 1002       Impression and Plan: [name] is a very charming 72 year old white female.  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  This was actually a small lesion.  She is on the [place].  She has been on Alecensa for a year.  Again, everything seems to be improving.  Her renal function is back to normal.  Again we will get her back on Alecensa 300 mg p.o. twice daily.  I think her last PET  scan was done back in August.  I do not think we need another 1 probably until November.  I am glad that her hemoglobin is improved.  The Aranesp really has helped this out.  She does not need any Aranesp today.  I would like to have her come back in about 3 weeks.  We will see how she is feeling.  If, we see difficulties or changes with her renal function or if she does not feel as good, then we may have to switch her over to a different ALK inhibitor.   Josph Macho, MD 9/26/202411:11 AM

## 2023-05-07 DIAGNOSIS — G4733 Obstructive sleep apnea (adult) (pediatric): Secondary | ICD-10-CM | POA: Diagnosis not present

## 2023-05-11 ENCOUNTER — Other Ambulatory Visit: Payer: Self-pay

## 2023-05-11 DIAGNOSIS — C7951 Secondary malignant neoplasm of bone: Secondary | ICD-10-CM

## 2023-05-11 MED ORDER — ALECENSA 150 MG PO CAPS: 600.0000 mg | ORAL_CAPSULE | Freq: Two times a day (BID) | ORAL | 6 refills | Status: DC

## 2023-05-11 NOTE — Telephone Encounter (Signed)
Refill request received from [facility] requesting Alecensa 150mg  4 capsules twice a day. Please advise if ok to refill, thanks!

## 2023-05-19 ENCOUNTER — Other Ambulatory Visit: Payer: Self-pay | Admitting: Family Medicine

## 2023-05-19 DIAGNOSIS — I10 Essential (primary) hypertension: Secondary | ICD-10-CM

## 2023-05-22 ENCOUNTER — Other Ambulatory Visit (HOSPITAL_BASED_OUTPATIENT_CLINIC_OR_DEPARTMENT_OTHER): Payer: Self-pay

## 2023-05-22 MED ORDER — INFLUENZA VAC A&B SURF ANT ADJ 0.5 ML IM SUSY
0.5000 mL | PREFILLED_SYRINGE | Freq: Once | INTRAMUSCULAR | 0 refills | Status: AC
  Filled 2023-05-22: qty 0.5, 1d supply, fill #0

## 2023-05-26 ENCOUNTER — Inpatient Hospital Stay: Payer: Medicare HMO | Attending: Hematology & Oncology

## 2023-05-26 ENCOUNTER — Telehealth: Payer: Self-pay | Admitting: *Deleted

## 2023-05-26 ENCOUNTER — Encounter: Payer: Self-pay | Admitting: Hematology & Oncology

## 2023-05-26 ENCOUNTER — Inpatient Hospital Stay (HOSPITAL_BASED_OUTPATIENT_CLINIC_OR_DEPARTMENT_OTHER): Payer: Medicare HMO | Admitting: Hematology & Oncology

## 2023-05-26 ENCOUNTER — Inpatient Hospital Stay: Payer: Medicare HMO

## 2023-05-26 DIAGNOSIS — C7951 Secondary malignant neoplasm of bone: Secondary | ICD-10-CM | POA: Insufficient documentation

## 2023-05-26 DIAGNOSIS — C3490 Malignant neoplasm of unspecified part of unspecified bronchus or lung: Secondary | ICD-10-CM | POA: Diagnosis not present

## 2023-05-26 DIAGNOSIS — D649 Anemia, unspecified: Secondary | ICD-10-CM | POA: Diagnosis not present

## 2023-05-26 DIAGNOSIS — N289 Disorder of kidney and ureter, unspecified: Secondary | ICD-10-CM | POA: Insufficient documentation

## 2023-05-26 DIAGNOSIS — C3432 Malignant neoplasm of lower lobe, left bronchus or lung: Secondary | ICD-10-CM | POA: Insufficient documentation

## 2023-05-26 LAB — CBC WITH DIFFERENTIAL (CANCER CENTER ONLY)
Abs Immature Granulocytes: 0.07 10*3/uL (ref 0.00–0.07)
Basophils Absolute: 0 10*3/uL (ref 0.0–0.1)
Basophils Relative: 1 %
Eosinophils Absolute: 0.2 10*3/uL (ref 0.0–0.5)
Eosinophils Relative: 5 %
HCT: 33.4 % — ABNORMAL LOW (ref 36.0–46.0)
Hemoglobin: 11.3 g/dL — ABNORMAL LOW (ref 12.0–15.0)
Immature Granulocytes: 2 %
Lymphocytes Relative: 20 %
Lymphs Abs: 0.8 10*3/uL (ref 0.7–4.0)
MCH: 33.2 pg (ref 26.0–34.0)
MCHC: 33.8 g/dL (ref 30.0–36.0)
MCV: 98.2 fL (ref 80.0–100.0)
Monocytes Absolute: 0.4 10*3/uL (ref 0.1–1.0)
Monocytes Relative: 10 %
Neutro Abs: 2.4 10*3/uL (ref 1.7–7.7)
Neutrophils Relative %: 62 %
Platelet Count: 208 10*3/uL (ref 150–400)
RBC: 3.4 MIL/uL — ABNORMAL LOW (ref 3.87–5.11)
RDW: 13.7 % (ref 11.5–15.5)
WBC Count: 3.8 10*3/uL — ABNORMAL LOW (ref 4.0–10.5)
nRBC: 0 % (ref 0.0–0.2)

## 2023-05-26 LAB — IRON AND IRON BINDING CAPACITY (CC-WL,HP ONLY)
Iron: 93 ug/dL (ref 28–170)
Saturation Ratios: 18 % (ref 10.4–31.8)
TIBC: 525 ug/dL — ABNORMAL HIGH (ref 250–450)
UIBC: 432 ug/dL (ref 148–442)

## 2023-05-26 LAB — CMP (CANCER CENTER ONLY)
ALT: 35 U/L (ref 0–44)
AST: 30 U/L (ref 15–41)
Albumin: 4.1 g/dL (ref 3.5–5.0)
Alkaline Phosphatase: 70 U/L (ref 38–126)
Anion gap: 9 (ref 5–15)
BUN: 26 mg/dL — ABNORMAL HIGH (ref 8–23)
CO2: 30 mmol/L (ref 22–32)
Calcium: 9.9 mg/dL (ref 8.9–10.3)
Chloride: 100 mmol/L (ref 98–111)
Creatinine: 1.11 mg/dL — ABNORMAL HIGH (ref 0.44–1.00)
GFR, Estimated: 53 mL/min — ABNORMAL LOW (ref >60)
Glucose, Bld: 128 mg/dL — ABNORMAL HIGH (ref 70–99)
Potassium: 3.9 mmol/L (ref 3.5–5.1)
Sodium: 139 mmol/L (ref 135–145)
Total Bilirubin: 0.5 mg/dL (ref 0.3–1.2)
Total Protein: 7 g/dL (ref 6.5–8.1)

## 2023-05-26 LAB — RETICULOCYTES
Immature Retic Fract: 10.4 % (ref 2.3–15.9)
RBC.: 3.45 MIL/uL — ABNORMAL LOW (ref 3.87–5.11)
Retic Count, Absolute: 63.8 10*3/uL (ref 19.0–186.0)
Retic Ct Pct: 1.9 % (ref 0.4–3.1)

## 2023-05-26 LAB — FERRITIN: Ferritin: 32 ng/mL (ref 11–307)

## 2023-05-26 LAB — LACTATE DEHYDROGENASE: LDH: 203 U/L — ABNORMAL HIGH (ref 98–192)

## 2023-05-26 MED ORDER — DENOSUMAB 120 MG/1.7ML ~~LOC~~ SOLN
120.0000 mg | Freq: Once | SUBCUTANEOUS | Status: AC
  Administered 2023-05-26: 120 mg via SUBCUTANEOUS
  Filled 2023-05-26: qty 1.7

## 2023-05-26 MED ORDER — BRIGATINIB 90 & 180 MG PO TBPK: ORAL_TABLET | ORAL | 0 refills | Status: DC

## 2023-05-26 NOTE — Patient Instructions (Signed)
Denosumab Injection (Oncology) What is this medication? DENOSUMAB (den oh SUE mab) prevents weakened bones caused by cancer. It may also be used to treat noncancerous bone tumors that cannot be removed by surgery. It can also be used to treat high calcium levels in the blood caused by cancer. It works by blocking a protein that causes bones to break down quickly. This slows down the release of calcium from bones, which lowers calcium levels in your blood. It also makes your bones stronger and less likely to break (fracture). This medicine may be used for other purposes; ask your health care provider or pharmacist if you have questions. COMMON BRAND NAME(S): XGEVA What should I tell my care team before I take this medication? They need to know if you have any of these conditions: Dental disease Having surgery or tooth extraction Infection Kidney disease Low levels of calcium or vitamin D in the blood Malnutrition On hemodialysis Skin conditions or sensitivity Thyroid or parathyroid disease An unusual reaction to denosumab, other medications, foods, dyes, or preservatives Pregnant or trying to get pregnant Breast-feeding How should I use this medication? This medication is for injection under the skin. It is given by your care team in a hospital or clinic setting. A special MedGuide will be given to you before each treatment. Be sure to read this information carefully each time. Talk to your care team about the use of this medication in children. While it may be prescribed for children as young as 13 years for selected conditions, precautions do apply. Overdosage: If you think you have taken too much of this medicine contact a poison control center or emergency room at once. NOTE: This medicine is only for you. Do not share this medicine with others. What if I miss a dose? Keep appointments for follow-up doses. It is important not to miss your dose. Call your care team if you are unable to  keep an appointment. What may interact with this medication? Do not take this medication with any of the following: Other medications containing denosumab This medication may also interact with the following: Medications that lower your chance of fighting infection Steroid medications, such as prednisone or cortisone This list may not describe all possible interactions. Give your health care provider a list of all the medicines, herbs, non-prescription drugs, or dietary supplements you use. Also tell them if you smoke, drink alcohol, or use illegal drugs. Some items may interact with your medicine. What should I watch for while using this medication? Your condition will be monitored carefully while you are receiving this medication. You may need blood work while taking this medication. This medication may increase your risk of getting an infection. Call your care team for advice if you get a fever, chills, sore throat, or other symptoms of a cold or flu. Do not treat yourself. Try to avoid being around people who are sick. You should make sure you get enough calcium and vitamin D while you are taking this medication, unless your care team tells you not to. Discuss the foods you eat and the vitamins you take with your care team. Some people who take this medication have severe bone, joint, or muscle pain. This medication may also increase your risk for jaw problems or a broken thigh bone. Tell your care team right away if you have severe pain in your jaw, bones, joints, or muscles. Tell your care team if you have any pain that does not go away or that gets worse. Talk   to your care team if you may be pregnant. Serious birth defects can occur if you take this medication during pregnancy and for 5 months after the last dose. You will need a negative pregnancy test before starting this medication. Contraception is recommended while taking this medication and for 5 months after the last dose. Your care team  can help you find the option that works for you. What side effects may I notice from receiving this medication? Side effects that you should report to your care team as soon as possible: Allergic reactions--skin rash, itching, hives, swelling of the face, lips, tongue, or throat Bone, joint, or muscle pain Low calcium level--muscle pain or cramps, confusion, tingling, or numbness in the hands or feet Osteonecrosis of the jaw--pain, swelling, or redness in the mouth, numbness of the jaw, poor healing after dental work, unusual discharge from the mouth, visible bones in the mouth Side effects that usually do not require medical attention (report to your care team if they continue or are bothersome): Cough Diarrhea Fatigue Headache Nausea This list may not describe all possible side effects. Call your doctor for medical advice about side effects. You may report side effects to FDA at 1-800-FDA-1088. Where should I keep my medication? This medication is given in a hospital or clinic. It will not be stored at home. NOTE: This sheet is a summary. It may not cover all possible information. If you have questions about this medicine, talk to your doctor, pharmacist, or health care provider.  2024 Elsevier/Gold Standard (2021-12-15 00:00:00)  

## 2023-05-26 NOTE — Telephone Encounter (Signed)
MyChart msg to pt

## 2023-05-26 NOTE — Progress Notes (Signed)
Hematology and Oncology Follow Up Visit  Laura Bush [id number] [birth year] 72 y.o. 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 3 months  -next dose in 08/2023   S/P SBRT to LUL -- completed on 03/04/2022 S/P XRT to RIGHT femur -- completed on 03/04/2022 Alecensa 600 mg po BID -- start on 03/24/2022 --on hold starting 04/04/2023 -restart 300 mg p.o. twice daily on 05/04/2023 --DC on 05/26/2023 Brigatinib 180 mg p.o. daily-start 06/04/2023 Aranesp 300 mcg subcu every 3 weeks     Interim History:  [name] is back for follow-up.  Unfortunately, I think when I had to get her off the Alecensa.  I just think that she is having too many in the way of side effects right now.  She has a lot of edema in the legs.  This happened after she started the 300 mg p.o. twice daily dosing.  Her renal function is up a little bit also.  Think a good option for her would be Brigatinib.  I think this does not have as much of a risk of lower extremity edema.  With Brigatinib, the starting dose of 90 mg a day for 7 days then goes up to 180 mg a day.  She and her husband are going go on vacation starting this weekend.  They will be gone for about a week.  I do not think we have to get her on this until she comes back.    She has not complained of any pain.  There has been no problems with nausea or vomiting.  She has had no fever.  There has been no cough.  She has had no headache.  Overall, I would say performance status is probably ECOG 1.    Medications:  Current Outpatient Medications:    acetaminophen (TYLENOL) 500 MG tablet, Take 1 tablet (500 mg total) by mouth every 8 (eight) hours as needed for mild pain or moderate pain., Disp: 30 tablet, Rfl: 0   alectinib (ALECENSA) 150 MG capsule,  Take 4 capsules (600 mg total) by mouth 2 (two) times daily with a meal., Disp: 240 capsule, Rfl: 6   aMILoride (MIDAMOR) 5 MG tablet, Take 1 tablet (5 mg total) by mouth daily., Disp: 90 tablet, Rfl: 0   aspirin EC 81 MG tablet, Take 81 mg by mouth in the morning. Swallow whole., Disp: , Rfl:    B Complex-C (SUPER B COMPLEX PO), Take 1 tablet by mouth daily with breakfast., Disp: , Rfl:    Calcium Carb-Cholecalciferol (CALCIUM + D3 PO), Take 1 tablet by mouth in the morning., Disp: , Rfl:    cholecalciferol (VITAMIN D3) 25 MCG (1000 UNIT) tablet, Take 1,000 Units by mouth in the morning., Disp: , Rfl:    COLLAGEN PO, Take 3 tablets by mouth in the morning and at bedtime. With Biotin, Disp: , Rfl:    Denosumab (XGEVA [place]), Inject into the skin every 3 (three) months., Disp: , Rfl:    fexofenadine (ALLEGRA) 180 MG tablet, Take 180 mg by mouth in the morning., Disp: , Rfl:    folic acid (FOLATE) 400 MCG tablet, Take 400 mcg by mouth in the morning., Disp: , Rfl:    hydrochlorothiazide (HYDRODIURIL) 25 MG tablet, Take 1 tablet (25 mg total) by mouth daily., Disp: 90 tablet,  Rfl: 0   ibuprofen (ADVIL) 200 MG tablet, Take 600 mg by mouth daily as needed., Disp: , Rfl:    ipratropium (ATROVENT) 0.06 % nasal spray, Place 2 sprays into both nostrils 4 (four) times daily as needed for rhinitis., Disp: 15 mL, Rfl: 4   metFORMIN (GLUCOPHAGE) 500 MG tablet, TAKE TWO TABELTS AT LUNCH AND ONE TABLET AT DINNER (Patient taking differently: Take 1,000 mg by mouth daily at 12 noon.), Disp: 270 tablet, Rfl: 0   pantoprazole (PROTONIX) 40 MG tablet, TAKE ONE TABLET BY MOUTH DAILY (Patient taking differently: Take 40 mg by mouth daily as needed (indigestion/heartburn.).), Disp: 90 tablet, Rfl: 3   polyethylene glycol (MIRALAX / GLYCOLAX) 17 g packet, Take 17 g by mouth daily as needed (constipation)., Disp: , Rfl:    pravastatin (PRAVACHOL) 20 MG tablet, Take 1 tablet (20 mg total) by mouth 3 (three) times a week.  (Patient taking differently: Take 20 mg by mouth every Monday, Wednesday, and Friday at 8 PM.), Disp: 40 tablet, Rfl: 3   traZODone (DESYREL) 50 MG tablet, TAKE 1 TABLET BY MOUTH EVERY NIGHT AT BEDTIME AS NEEDED FOR SLEEP (Patient taking differently: Take 50 mg by mouth at bedtime. for sleep), Disp: 90 tablet, Rfl: 2   valsartan (DIOVAN) 80 MG tablet, Take 1 tablet (80 mg total) by mouth daily., Disp: 90 tablet, Rfl: 3   furosemide (LASIX) 20 MG tablet, TAKE 1 TABLET BY MOUTH DAILY AS NEEDED (Patient not taking: Reported on 05/26/2023), Disp: 30 tablet, Rfl: 3  Allergies:  Allergies  Allergen Reactions   Diclofenac Hypertension   Amlodipine Swelling and Other (See Comments)    Limbs swell, not the throat   Lanolin Other (See Comments)    Sneezing and watery eyes. Allergic to [facility].   Tape Other (See Comments)    Redness (Bandaids also)    Past Medical History, Surgical history, Social history, and Family History were reviewed and updated.  Review of Systems: Review of Systems  Constitutional: Negative.   HENT:  Negative.    Eyes: Negative.   Respiratory: Negative.    Cardiovascular: Negative.   Gastrointestinal: Negative.   Endocrine: Negative.   Genitourinary: Negative.    Musculoskeletal:  Positive for arthralgias.  Skin: Negative.   Neurological: Negative.   Hematological: Negative.   Psychiatric/Behavioral: Negative.      Physical Exam: Vital signs show temperature of 98.3.  Pulse 60.  Blood pressure 126/58.  Weight is 172 pounds.  Wt Readings from Last 3 Encounters:  05/04/23 172 lb (78 kg)  04/11/23 172 lb (78 kg)  04/04/23 177 lb (80.3 kg)    Physical Exam Vitals reviewed.  HENT:     Head: Normocephalic and atraumatic.  Eyes:     Pupils: Pupils are equal, round, and reactive to light.  Cardiovascular:     Rate and Rhythm: Normal rate and regular rhythm.     Heart sounds: Normal heart sounds.  Pulmonary:     Effort: Pulmonary effort is normal.     Breath  sounds: Normal breath sounds.  Abdominal:     General: Bowel sounds are normal.     Palpations: Abdomen is soft.  Musculoskeletal:        General: No tenderness or deformity. Normal range of motion.     Cervical back: Normal range of motion.     Comments: She has about 2+ edema in the lower legs.  I cannot palpate any venous cord.  She has a negative Homans' sign.  Lymphadenopathy:  Cervical: No cervical adenopathy.  Skin:    General: Skin is warm and dry.     Findings: No erythema or rash.  Neurological:     Mental Status: She is alert and oriented to person, place, and time.  Psychiatric:        Behavior: Behavior normal.        Thought Content: Thought content normal.        Judgment: Judgment normal.     Lab Results  Component Value Date   WBC 3.8 (L) 05/26/2023   HGB 11.3 (L) 05/26/2023   HCT 33.4 (L) 05/26/2023   MCV 98.2 05/26/2023   PLT 208 05/26/2023     Chemistry      Component Value Date/Time   NA 139 05/26/2023 1232   NA 141 12/27/2022 0812   NA 142 05/29/2017 1002   K 3.9 05/26/2023 1232   K 3.9 05/29/2017 1002   CL 100 05/26/2023 1232   CO2 30 05/26/2023 1232   CO2 28 05/29/2017 1002   BUN 26 (H) 05/26/2023 1232   BUN 28 (H) 12/27/2022 0812   BUN 13.5 05/29/2017 1002   CREATININE 1.11 (H) 05/26/2023 1232   CREATININE 0.81 04/06/2020 1059   CREATININE 0.8 05/29/2017 1002      Component Value Date/Time   CALCIUM 9.9 05/26/2023 1232   CALCIUM 9.4 05/29/2017 1002   ALKPHOS 70 05/26/2023 1232   ALKPHOS 39 (L) 05/29/2017 1002   AST 30 05/26/2023 1232   AST 18 05/29/2017 1002   ALT 35 05/26/2023 1232   ALT 19 05/29/2017 1002   BILITOT 0.5 05/26/2023 1232   BILITOT 0.48 05/29/2017 1002       Impression and Plan: [name] is a very charming 72 year old white female.  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  This was actually a small lesion.  Again, we will switch her off the Alecensa and onto another ALK  inhibitor-Brigatinib.  I think this is reasonable.  We will go ahead and get her set up with another PET scan.  Will probably get this done in November.  I just hate to switch out of the Alecensa since she was doing well with this.  However, I also think that she is beginning to have more the way of toxicity that was affecting her quality of life.  I know the quality of life is very important for both she and her husband.  At least, her hemoglobin is doing pretty well.  She does not need any Aranesp today.  Her iron studies show iron saturation of 18%.  I think this is acceptable right now.  I would like to get her back in about a month or so.  Again, we will get the PET scan set up for her before we see her back.   Josph Macho, MD 10/18/20241:45 PM

## 2023-05-29 ENCOUNTER — Other Ambulatory Visit (HOSPITAL_COMMUNITY): Payer: Self-pay

## 2023-05-29 ENCOUNTER — Telehealth: Payer: Self-pay | Admitting: Pharmacist

## 2023-05-29 ENCOUNTER — Telehealth: Payer: Self-pay

## 2023-05-29 DIAGNOSIS — C3490 Malignant neoplasm of unspecified part of unspecified bronchus or lung: Secondary | ICD-10-CM

## 2023-05-29 MED ORDER — LORLATINIB 100 MG PO TABS
100.0000 mg | ORAL_TABLET | Freq: Every day | ORAL | 0 refills | Status: DC
  Filled 2023-06-12: qty 30, 30d supply, fill #0

## 2023-05-29 NOTE — Telephone Encounter (Signed)
Oral Oncology Pharmacist Encounter  Received new prescription for Alunbrig (brigatinib) for the treatment of metastatic non-small cell lung cancer, ALK positive, planned duration until disease progression or unacceptable drug toxicity.  CBC w/ Diff and CMP from 05/26/23 assessed, patient with Scr of 1.11 mg/dL (CrCl 16.1 mL/min) - no renal dose adjustments required. Patient's last BP reading in clinic on 05/04/23 was 126/58 mmHg - recommend monitoring patient for elevated BP while on Alunbrig as this is a side effect. Prescription dose and frequency assessed for appropriateness.   Current medication list in Epic reviewed, no relevant/significant DDIs with Alunbrig identified.  Evaluated chart and no patient barriers to medication adherence noted.   Prescription has been e-scribed to the [facility] for benefits analysis and approval.  Oral Oncology Clinic will continue to follow for insurance authorization, copayment issues, initial counseling and start date.  Lenord Carbo, PharmD, BCPS, [facility] Hematology/Oncology Clinical Pharmacist [facility] and [facility] Oral Chemotherapy Navigation Clinics [phone] 05/29/2023 8:36 AM

## 2023-05-29 NOTE — Telephone Encounter (Signed)
Oral Oncology Patient Advocate Encounter  New authorization   Received notification that prior authorization for Laura Bush is required.   PA submitted on 05/29/23  Key [id number]  Status is pending     Ardeen Fillers, CPhT Oncology Pharmacy Patient Advocate  [facility]  [phone] (phone) [phone] (fax) 05/29/2023 11:52 AM

## 2023-05-29 NOTE — Telephone Encounter (Signed)
Oral Oncology Pharmacist Encounter  Received new prescription for Lorbrena (lorlatinib) for the treatment of metastatic non-small cell lung cancer, ALK-positive, planned duration until disease progression or unacceptable drug toxicity.  CBC w/ Diff and CMP from 05/26/23 assessed, no baseline dose adjustments required at this time based on renal or hepatic function. Patient will need to monitor BP after starting therapy as there is risk of increased BP with [place] Prescription dose and frequency assessed for appropriateness.  Current medication list in Epic reviewed, DDIs with [place] identified: Category C drug-drug interaction between [place] and Fexofenadine - Lorbrena may decrease efficacy of fexofenadine - monitor for decreased efficacy of fexofenadine, no change in therapy warranted at this time.   Category C drug-drug interaction between [place] and Trazodone - Lorbrena may decrease serum concentrations, thus efficacy of trazodone - monitor for decreased efficacy of trazodone, no change in therapy warranted at this time.   Evaluated chart and no patient barriers to medication adherence noted.   Prescription has been e-scribed to the [facility] for benefits analysis and approval.  Oral Oncology Clinic will continue to follow for insurance authorization, copayment issues, initial counseling and start date.  Lenord Carbo, PharmD, BCPS, BCOP Hematology/Oncology Clinical Pharmacist [facility] and [facility] Oral Chemotherapy Navigation Clinics [phone] 05/29/2023 11:00 AM

## 2023-05-29 NOTE — Telephone Encounter (Signed)
Oral Oncology Patient Advocate Encounter  New authorization   Received notification that prior authorization for Alunbrig is required.   PA submitted on 05/29/23  Key [id number]  Status is pending     Ardeen Fillers, CPhT Oncology Pharmacy Patient Advocate  [facility]  [phone] (phone) [phone] (fax) 05/29/2023 7:49 AM

## 2023-05-29 NOTE — Telephone Encounter (Signed)
Oral Oncology Patient Advocate Encounter  Reached out and spoke with patient regarding PAP paperwork, explained that I would send it to their preferred email via DocuSign.   Confirmed email address: [email or web address].    Patient expressed understanding and consent.  Will follow up once paperwork has been signed and returned.   Ardeen Fillers, CPhT Oncology Pharmacy Patient Advocate  [facility]  [phone] (phone) [phone] (fax) 05/29/2023 2:06 PM

## 2023-05-29 NOTE — Telephone Encounter (Signed)
Oral Chemotherapy Pharmacist Encounter   Due to patient having high copay with Alunbrig, and the manufacturer Raelene Bott) not offering patient assistance for medicare part D patients, patient will be started on a different ALK directed TKI.   Spoke with Dr. Myna Hidalgo, and patient will switch to [place] instead at this time. Prescription set to [facility] for processing and co-signed back to Dr. Myna Hidalgo.   Lenord Carbo, PharmD, BCPS, [facility] Hematology/Oncology Clinical Pharmacist [facility] and [facility] Oral Chemotherapy Navigation Clinics [phone] 05/29/2023 10:54 AM

## 2023-05-29 NOTE — Telephone Encounter (Addendum)
Oral Oncology Patient Advocate Encounter  Prior Authorization for Alunbrig has been approved.    PA# [id number]  Effective dates: 08/08/22 through 08/08/23  Patients co-pay is $3,215.11.   No grant funding open and no PAP available for medication. Message sent to MD Office to advise.   Due to no PAP available for Alunbrig, MD changed medication to Lorbrena (lorlatinib).   See additional encounter.    Ardeen Fillers, CPhT Oncology Pharmacy Patient Advocate  [facility]  [phone] (phone) [phone] (fax) 05/29/2023 8:10 AM

## 2023-05-29 NOTE — Telephone Encounter (Addendum)
Oral Oncology Patient Advocate Encounter  Prior Authorization for Laura Bush has been approved.    PA# [id number]  Effective dates: 08/08/22 through 08/08/23  Patients co-pay is $3,215.11.   PAP initiated. See additional encounter.    Ardeen Fillers, CPhT Oncology Pharmacy Patient Advocate  [facility]  [phone] (phone) [phone] (fax) 05/29/2023 12:17 PM

## 2023-05-29 NOTE — Telephone Encounter (Signed)
Oral Oncology Patient Advocate Encounter   Began application for assistance for Lorbrena through [employer] On[employer] Together Patient Assistance Program.   Application will be submitted upon completion of necessary supporting documentation.   [employer]'s phone number [phone].   I will continue to check the status until final determination.    Ardeen Fillers, CPhT Oncology Pharmacy Patient Advocate  [facility]  [phone] (phone) [phone] (fax) 05/29/2023 12:59 PM

## 2023-06-05 NOTE — Telephone Encounter (Signed)
Oral Oncology Patient Advocate Encounter   Submitted application for assistance for Lorbrena to [employer] On[employer] Together Patient Assistance Program.   Application submitted via e-fax to [phone]   [employer]'s phone number [phone].   I will continue to check the status until final determination.    Ardeen Fillers, CPhT Oncology Pharmacy Patient Advocate  [facility]  [phone] (phone) [phone] (fax) 06/05/2023 10:36 AM

## 2023-06-06 ENCOUNTER — Other Ambulatory Visit (HOSPITAL_BASED_OUTPATIENT_CLINIC_OR_DEPARTMENT_OTHER): Payer: Self-pay

## 2023-06-06 MED ORDER — COMIRNATY 30 MCG/0.3ML IM SUSY
PREFILLED_SYRINGE | Freq: Once | INTRAMUSCULAR | 0 refills | Status: AC
  Filled 2023-06-06: qty 0.3, 1d supply, fill #0

## 2023-06-07 ENCOUNTER — Encounter: Payer: Self-pay | Admitting: Family Medicine

## 2023-06-07 NOTE — Telephone Encounter (Signed)
Called to check status of application. Informed by representative that application was currently under review and to expect a determination in 1 to 2 business days via fax. I will continue to follow and update until final determination.    Ardeen Fillers, CPhT Oncology Pharmacy Patient Advocate  [facility]  [phone] (phone) [phone] (fax) 06/07/2023 8:57 AM

## 2023-06-12 ENCOUNTER — Other Ambulatory Visit: Payer: Self-pay

## 2023-06-12 ENCOUNTER — Other Ambulatory Visit (HOSPITAL_COMMUNITY): Payer: Self-pay

## 2023-06-12 ENCOUNTER — Encounter: Payer: Self-pay | Admitting: Hematology & Oncology

## 2023-06-12 ENCOUNTER — Telehealth: Payer: Self-pay

## 2023-06-12 NOTE — Progress Notes (Signed)
Oral Chemotherapy Pharmacist Encounter  Patient was counseled under telephone encounter from 05/29/23.  Lenord Carbo, PharmD, BCPS, [facility] Hematology/Oncology Clinical Pharmacist [facility] Oral Chemotherapy Navigation Clinics [phone] 06/12/2023 9:32 AM

## 2023-06-12 NOTE — Telephone Encounter (Signed)
Oral Oncology Patient Advocate Encounter   Was successful in obtaining a voucher card for [place]  This voucher card will make the patients copay $0.00 for the first month.  I have spoken with the patient.    The billing information is as follows and has been shared with [facility].   RxBin: [id number] PCN:  Member ID: [id number] Group ID: [id number]   Ardeen Fillers, CPhT Oncology Pharmacy Patient Advocate  [facility]  [phone] (phone) [phone] (fax) 06/12/2023 9:20 AM

## 2023-06-12 NOTE — Progress Notes (Signed)
Specialty Pharmacy Initial Fill Coordination Note  Laura Bush is a 72 y.o. female contacted today regarding refills of specialty medication(s) Lorlatinib   Patient requested Delivery   Delivery date: 06/14/23   Verified address: [address] [place], [place] [address]   Medication will be filled on 06/13/23.   Patient is aware of $0.00 copayment. [employer] account executive as primary insurance.    Ardeen Fillers, CPhT Oncology Pharmacy Patient Advocate  [facility]  [phone] (phone) [phone] (fax) 06/12/2023 9:28 AM

## 2023-06-12 NOTE — Telephone Encounter (Signed)
Patient successfully OnBoarded and drug education provided by pharmacist. Medication scheduled to be shipped on Tuesday, 06/13/23, for delivery on Wednesday, 06/14/23, from [facility] to patient's address. Patient also knows to call me at [phone] with any questions or concerns regarding receiving medication or if there is any unexpected change in co-pay.    Ardeen Fillers, CPhT Oncology Pharmacy Patient Advocate  [facility]  [phone] (phone) [phone] (fax) 06/12/2023 9:46 AM

## 2023-06-12 NOTE — Telephone Encounter (Signed)
Oral Oncology Pharmacist Encounter  I spoke with patient for overview of new oral chemotherapy medication: Lorbrena (lorlatinib) for the treatment of metastatic non-small cell lung cancer, ALK positive, planned duration until disease progression or unacceptable drug toxicity.   Pt is doing well. Counseled patient on administration, dosing, side effects, monitoring, drug-food interactions, safe handling, storage, and disposal.   Patient will take Lorbrena 100 mg tablets, 1 tablet by mouth daily with or without food.  Patient knows to avoid grapefruit and grapefruit juice while on [place]   Start date: 06/15/23   Side effects include but not limited to: increased blood pressure, edema, changes in electrolytes, changes in LFTs, peripheral neuropathy, CNS/mood changes   Reviewed with patient importance of keeping a medication schedule and plan for any missed doses.   After discussion with patient no patient barriers to medication adherence identified.    Laura Bush voiced understanding and appreciation. All questions answered. Medication handout provided.   One time free trial offer of [place] voucher is being utilized on first fill since patient does not qualify for manufacturer assistance (income too high) and will allow her to try medication for a month before committing to high copay.   Provided patient with Oral Chemotherapy Navigation Clinic phone number. Patient knows to call the office with questions or concerns. Oral Chemotherapy Navigation Clinic will continue to follow.   Lenord Carbo, PharmD, BCPS, [facility] Hematology/Oncology Clinical Pharmacist [facility] Oral Chemotherapy Navigation Clinics [phone] 06/12/2023 9:23 AM

## 2023-06-13 ENCOUNTER — Other Ambulatory Visit: Payer: Self-pay

## 2023-06-14 NOTE — Telephone Encounter (Signed)
Oral Oncology Patient Advocate Encounter   Received notification that the application for assistance for Lorbrena through [employer] On[employer] Together Patient Assistance Program has been denied due to patient exceeds income limits.   [employer]'s phone number [phone].   Ardeen Fillers, CPhT Oncology Pharmacy Patient Advocate  [facility]  [phone] (phone) [phone] (fax) 06/14/2023 2:32 PM

## 2023-06-15 ENCOUNTER — Encounter (HOSPITAL_COMMUNITY)
Admission: RE | Admit: 2023-06-15 | Discharge: 2023-06-15 | Disposition: A | Discharge status: Home / Self Care | Payer: Medicare HMO | Source: Ambulatory Visit | Attending: Hematology & Oncology | Admitting: Hematology & Oncology

## 2023-06-15 DIAGNOSIS — C3490 Malignant neoplasm of unspecified part of unspecified bronchus or lung: Secondary | ICD-10-CM | POA: Insufficient documentation

## 2023-06-15 DIAGNOSIS — C7951 Secondary malignant neoplasm of bone: Secondary | ICD-10-CM | POA: Insufficient documentation

## 2023-06-15 LAB — GLUCOSE, CAPILLARY: Glucose-Capillary: 103 mg/dL — ABNORMAL HIGH (ref 70–99)

## 2023-06-15 MED ORDER — FLUDEOXYGLUCOSE F - 18 (FDG) INJECTION
8.5000 | Freq: Once | INTRAVENOUS | Status: AC | PRN
  Administered 2023-06-15: 8.5 via INTRAVENOUS

## 2023-06-19 NOTE — Telephone Encounter (Signed)
Called and spoke to patient regarding denial to [employer] PAP program. Patient knows we have her waitlisted for when great funding opens and we can secure a grant to continue helping to cover co-pay of [place] Patient also knows to call me at [phone] with any questions or concerns regarding receiving medication.    Ardeen Fillers, CPhT Oncology Pharmacy Patient Advocate  [facility]  [phone] (phone) [phone] (fax) 06/19/2023 8:25 AM

## 2023-06-21 ENCOUNTER — Inpatient Hospital Stay: Payer: Medicare HMO

## 2023-06-21 ENCOUNTER — Inpatient Hospital Stay (HOSPITAL_BASED_OUTPATIENT_CLINIC_OR_DEPARTMENT_OTHER): Payer: Medicare HMO | Admitting: Hematology & Oncology

## 2023-06-21 ENCOUNTER — Encounter: Payer: Self-pay | Admitting: Hematology & Oncology

## 2023-06-21 ENCOUNTER — Inpatient Hospital Stay: Payer: Medicare HMO | Attending: Hematology & Oncology

## 2023-06-21 VITALS — BP 128/70 | HR 68 | Temp 97.7°F | Resp 17 | Wt 182.0 lb

## 2023-06-21 DIAGNOSIS — R609 Edema, unspecified: Secondary | ICD-10-CM | POA: Insufficient documentation

## 2023-06-21 DIAGNOSIS — D631 Anemia in chronic kidney disease: Secondary | ICD-10-CM | POA: Insufficient documentation

## 2023-06-21 DIAGNOSIS — C7951 Secondary malignant neoplasm of bone: Secondary | ICD-10-CM

## 2023-06-21 DIAGNOSIS — E611 Iron deficiency: Secondary | ICD-10-CM | POA: Diagnosis present

## 2023-06-21 DIAGNOSIS — C3490 Malignant neoplasm of unspecified part of unspecified bronchus or lung: Secondary | ICD-10-CM | POA: Diagnosis not present

## 2023-06-21 DIAGNOSIS — C3432 Malignant neoplasm of lower lobe, left bronchus or lung: Secondary | ICD-10-CM | POA: Diagnosis not present

## 2023-06-21 DIAGNOSIS — N183 Chronic kidney disease, stage 3 unspecified: Secondary | ICD-10-CM | POA: Diagnosis not present

## 2023-06-21 LAB — CBC WITH DIFFERENTIAL (CANCER CENTER ONLY)
Abs Immature Granulocytes: 0.02 10*3/uL (ref 0.00–0.07)
Basophils Absolute: 0 10*3/uL (ref 0.0–0.1)
Basophils Relative: 1 %
Eosinophils Absolute: 0.3 10*3/uL (ref 0.0–0.5)
Eosinophils Relative: 5 %
HCT: 34.2 % — ABNORMAL LOW (ref 36.0–46.0)
Hemoglobin: 11.4 g/dL — ABNORMAL LOW (ref 12.0–15.0)
Immature Granulocytes: 0 %
Lymphocytes Relative: 19 %
Lymphs Abs: 1 10*3/uL (ref 0.7–4.0)
MCH: 32.2 pg (ref 26.0–34.0)
MCHC: 33.3 g/dL (ref 30.0–36.0)
MCV: 96.6 fL (ref 80.0–100.0)
Monocytes Absolute: 0.4 10*3/uL (ref 0.1–1.0)
Monocytes Relative: 7 %
Neutro Abs: 3.6 10*3/uL (ref 1.7–7.7)
Neutrophils Relative %: 68 %
Platelet Count: 230 10*3/uL (ref 150–400)
RBC: 3.54 MIL/uL — ABNORMAL LOW (ref 3.87–5.11)
RDW: 13.1 % (ref 11.5–15.5)
WBC Count: 5.3 10*3/uL (ref 4.0–10.5)
nRBC: 0 % (ref 0.0–0.2)

## 2023-06-21 LAB — CMP (CANCER CENTER ONLY)
ALT: 20 U/L (ref 0–44)
AST: 19 U/L (ref 15–41)
Albumin: 4.2 g/dL (ref 3.5–5.0)
Alkaline Phosphatase: 57 U/L (ref 38–126)
Anion gap: 9 (ref 5–15)
BUN: 24 mg/dL — ABNORMAL HIGH (ref 8–23)
CO2: 30 mmol/L (ref 22–32)
Calcium: 9.6 mg/dL (ref 8.9–10.3)
Chloride: 100 mmol/L (ref 98–111)
Creatinine: 0.91 mg/dL (ref 0.44–1.00)
GFR, Estimated: >60 mL/min (ref >60)
Glucose, Bld: 181 mg/dL — ABNORMAL HIGH (ref 70–99)
Potassium: 3.4 mmol/L — ABNORMAL LOW (ref 3.5–5.1)
Sodium: 139 mmol/L (ref 135–145)
Total Bilirubin: 0.2 mg/dL (ref <1.2)
Total Protein: 7.1 g/dL (ref 6.5–8.1)

## 2023-06-21 LAB — RETICULOCYTES
Immature Retic Fract: 11.8 % (ref 2.3–15.9)
RBC.: 3.5 MIL/uL — ABNORMAL LOW (ref 3.87–5.11)
Retic Count, Absolute: 45.9 10*3/uL (ref 19.0–186.0)
Retic Ct Pct: 1.3 % (ref 0.4–3.1)

## 2023-06-21 LAB — LACTATE DEHYDROGENASE: LDH: 170 U/L (ref 98–192)

## 2023-06-21 LAB — FERRITIN: Ferritin: 26 ng/mL (ref 11–307)

## 2023-06-21 LAB — MAGNESIUM: Magnesium: 2.1 mg/dL (ref 1.7–2.4)

## 2023-06-21 MED ORDER — POTASSIUM CHLORIDE CRYS ER 20 MEQ PO TBCR: 20.0000 meq | EXTENDED_RELEASE_TABLET | Freq: Two times a day (BID) | ORAL | 4 refills | Status: DC

## 2023-06-21 NOTE — Progress Notes (Signed)
Hematology and Oncology Follow Up Visit  Laura Bush [id number] [birth year] 72 y.o. 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 3 months  -next dose in 08/2023   S/P SBRT to LUL -- completed on 03/04/2022 S/P XRT to RIGHT femur -- completed on 03/04/2022 Alecensa 600 mg po BID -- start on 03/24/2022 --on hold starting 04/04/2023 -restart 300 mg p.o. twice daily on 05/04/2023 --DC on 05/26/2023 Lorbrena 100 mg po q day  -- start on 06/14/2023 Aranesp 300 mcg subcu every 3 weeks     Interim History:  Laura Bush is back for follow-up.  We now have her on Lorbrena for non-small cell lung cancer.  She has been have more difficulties with the Alecensa..  She did have a PET scan that was done.  This was done on 06/15/2023.  The PET scan, thankfully, did not show any evidence of metastatic disease.  She does have some discomfort in the right thigh where she had her surgery.  She and her husband will be going out of [place] for the week.  I do not see a problem with this.  She has gained weight.  I suspect this probably is fluid retention from the [place]  I told her to start taking Lasix.  She is to take 40 mg a day.  I called in some potassium for her to take twice a day.  If this does not help with some of the edema, we may have to add Zaroxolyn.  She has had no cough.  There is been no shortness of breath.  She has had no change in bowel or bladder habits.  She has had no bleeding.  There is been no fever.  She has had no headache.  Overall, I would have said that her performance status is probably ECOG 1. .    Medications:  Current Outpatient Medications:    acetaminophen (TYLENOL) 500 MG tablet, Take 1 tablet (500 mg total) by mouth every 8 (eight) hours as needed for mild pain  or moderate pain., Disp: 30 tablet, Rfl: 0   aMILoride (MIDAMOR) 5 MG tablet, Take 1 tablet (5 mg total) by mouth daily., Disp: 90 tablet, Rfl: 0   aspirin EC 81 MG tablet, Take 81 mg by mouth in the morning. Swallow whole., Disp: , Rfl:    B Complex-C (SUPER B COMPLEX PO), Take 1 tablet by mouth daily with breakfast., Disp: , Rfl:    Calcium Carb-Cholecalciferol (CALCIUM + D3 PO), Take 1 tablet by mouth in the morning., Disp: , Rfl:    cholecalciferol (VITAMIN D3) 25 MCG (1000 UNIT) tablet, Take 1,000 Units by mouth in the morning., Disp: , Rfl:    COLLAGEN PO, Take 3 tablets by mouth in the morning and at bedtime. With Biotin, Disp: , Rfl:    Denosumab (XGEVA [place]), Inject into the skin every 3 (three) months., Disp: , Rfl:    fexofenadine (ALLEGRA) 180 MG tablet, Take 180 mg by mouth in the morning., Disp: , Rfl:    folic acid (FOLATE) 400 MCG tablet, Take 400 mcg by mouth in the morning., Disp: , Rfl:    furosemide (LASIX) 20 MG tablet, TAKE 1 TABLET BY MOUTH DAILY AS NEEDED, Disp: 30 tablet, Rfl: 3   hydrochlorothiazide (HYDRODIURIL) 25 MG tablet, Take 1 tablet (  25 mg total) by mouth daily., Disp: 90 tablet, Rfl: 0   ibuprofen (ADVIL) 200 MG tablet, Take 600 mg by mouth daily as needed., Disp: , Rfl:    ipratropium (ATROVENT) 0.06 % nasal spray, Place 2 sprays into both nostrils 4 (four) times daily as needed for rhinitis., Disp: 15 mL, Rfl: 4   lorlatinib (LORBRENA) 100 MG tablet, Take 1 tablet (100 mg total) by mouth daily. Swallow tablets whole. Do not chew, crush or split tablets., Disp: 30 tablet, Rfl: 0   metFORMIN (GLUCOPHAGE) 500 MG tablet, TAKE TWO TABELTS AT LUNCH AND ONE TABLET AT DINNER (Patient taking differently: Take 1,000 mg by mouth daily at 12 noon.), Disp: 270 tablet, Rfl: 0   pantoprazole (PROTONIX) 40 MG tablet, TAKE ONE TABLET BY MOUTH DAILY (Patient taking differently: Take 40 mg by mouth daily as needed (indigestion/heartburn.).), Disp: 90 tablet, Rfl: 3   polyethylene  glycol (MIRALAX / GLYCOLAX) 17 g packet, Take 17 g by mouth daily as needed (constipation)., Disp: , Rfl:    traZODone (DESYREL) 50 MG tablet, TAKE 1 TABLET BY MOUTH EVERY NIGHT AT BEDTIME AS NEEDED FOR SLEEP (Patient taking differently: Take 50 mg by mouth at bedtime. for sleep), Disp: 90 tablet, Rfl: 2   valsartan (DIOVAN) 80 MG tablet, Take 1 tablet (80 mg total) by mouth daily., Disp: 90 tablet, Rfl: 3   pravastatin (PRAVACHOL) 20 MG tablet, Take 1 tablet (20 mg total) by mouth 3 (three) times a week. (Patient taking differently: Take 20 mg by mouth every Monday, Wednesday, and Friday at 8 PM.), Disp: 40 tablet, Rfl: 3  Allergies:  Allergies  Allergen Reactions   Diclofenac Hypertension   Amlodipine Swelling and Other (See Comments)    Limbs swell, not the throat   Lanolin Other (See Comments)    Sneezing and watery eyes. Allergic to [facility].   Tape Other (See Comments)    Redness (Bandaids also)    Past Medical History, Surgical history, Social history, and Family History were reviewed and updated.  Review of Systems: Review of Systems  Constitutional: Negative.   HENT:  Negative.    Eyes: Negative.   Respiratory: Negative.    Cardiovascular: Negative.   Gastrointestinal: Negative.   Endocrine: Negative.   Genitourinary: Negative.    Musculoskeletal:  Positive for arthralgias.  Skin: Negative.   Neurological: Negative.   Hematological: Negative.   Psychiatric/Behavioral: Negative.      Physical Exam: Vital signs show temperature of 97.7.  Pulse 68.  Blood pressure 128/70.  Weight is 182 pounds.    Wt Readings from Last 3 Encounters:  06/21/23 182 lb (82.6 kg)  05/04/23 172 lb (78 kg)  04/11/23 172 lb (78 kg)    Physical Exam Vitals reviewed.  HENT:     Head: Normocephalic and atraumatic.  Eyes:     Pupils: Pupils are equal, round, and reactive to light.  Cardiovascular:     Rate and Rhythm: Normal rate and regular rhythm.     Heart sounds: Normal heart sounds.   Pulmonary:     Effort: Pulmonary effort is normal.     Breath sounds: Normal breath sounds.  Abdominal:     General: Bowel sounds are normal.     Palpations: Abdomen is soft.  Musculoskeletal:        General: No tenderness or deformity. Normal range of motion.     Cervical back: Normal range of motion.     Comments: She has about 2+ edema in the lower legs.  I cannot palpate any venous cord.  She has a negative Homans' sign.  Lymphadenopathy:     Cervical: No cervical adenopathy.  Skin:    General: Skin is warm and dry.     Findings: No erythema or rash.  Neurological:     Mental Status: She is alert and oriented to person, place, and time.  Psychiatric:        Behavior: Behavior normal.        Thought Content: Thought content normal.        Judgment: Judgment normal.     Lab Results  Component Value Date   WBC 5.3 06/21/2023   HGB 11.4 (L) 06/21/2023   HCT 34.2 (L) 06/21/2023   MCV 96.6 06/21/2023   PLT 230 06/21/2023     Chemistry      Component Value Date/Time   NA 139 05/26/2023 1232   NA 141 12/27/2022 0812   NA 142 05/29/2017 1002   K 3.9 05/26/2023 1232   K 3.9 05/29/2017 1002   CL 100 05/26/2023 1232   CO2 30 05/26/2023 1232   CO2 28 05/29/2017 1002   BUN 26 (H) 05/26/2023 1232   BUN 28 (H) 12/27/2022 0812   BUN 13.5 05/29/2017 1002   CREATININE 1.11 (H) 05/26/2023 1232   CREATININE 0.81 04/06/2020 1059   CREATININE 0.8 05/29/2017 1002      Component Value Date/Time   CALCIUM 9.9 05/26/2023 1232   CALCIUM 9.4 05/29/2017 1002   ALKPHOS 70 05/26/2023 1232   ALKPHOS 39 (L) 05/29/2017 1002   AST 30 05/26/2023 1232   AST 18 05/29/2017 1002   ALT 35 05/26/2023 1232   ALT 19 05/29/2017 1002   BILITOT 0.5 05/26/2023 1232   BILITOT 0.48 05/29/2017 1002       Impression and Plan: Laura Bush is a very charming 72 year old white female.  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  This was actually a small lesion.  Now, she  is on [place]  Hopefully, she will do well with this.  Again I worry about the fluid retention.  Hopefully the Lasix and potassium will help with this.  Again, I do not see a problem with her going down to [place].  She would does have compression stockings that she can wear on the drive.  We will plan to get her back to see [place] in about 5 weeks.  She does have a decent blood count right now.  She does not need any Aranesp.  We are following up with her iron studies also.   Josph Macho, MD 11/13/20242:29 PM

## 2023-06-21 NOTE — Progress Notes (Signed)
Hemoglobin noted at 11.4 today. No injection needed per MD's order.

## 2023-06-22 ENCOUNTER — Telehealth: Payer: Self-pay

## 2023-06-22 ENCOUNTER — Other Ambulatory Visit (HOSPITAL_COMMUNITY): Payer: Self-pay

## 2023-06-22 ENCOUNTER — Encounter: Payer: Self-pay | Admitting: Hematology & Oncology

## 2023-06-22 LAB — IRON AND IRON BINDING CAPACITY (CC-WL,HP ONLY)
Iron: 52 ug/dL (ref 28–170)
Saturation Ratios: 12 % (ref 10.4–31.8)
TIBC: 431 ug/dL (ref 250–450)
UIBC: 379 ug/dL (ref 148–442)

## 2023-06-22 NOTE — Telephone Encounter (Addendum)
Per Anders Grant pt can receive Venofer or Infed.  Per Dr Myna Hidalgo, pt to receive 2 doses of Venofer 300mg  at her convenience. This information sent to scheduling to make appts. Dph   ----- Message from Josph Macho sent at 06/22/2023 12:24 PM EST ----- Please call and let him know that the iron is on the low side.  I think she would benefit from a dose of IV iron.  Please set this up.  Cindee Lame

## 2023-06-22 NOTE — Telephone Encounter (Signed)
Oral Oncology Patient Advocate Encounter   Was successful in securing patient a $4,000.00 grant from [employer] to provide copayment coverage for [place]  This will keep the out of pocket expense at $0.    The billing information is as follows and has been shared with [facility].   Member ID: [id number] Group ID: [id number] RxBin: [id number] PCN: [id number] Dates of Eligibility: 06/20/23 through 06/19/24  Fund name:  Non Small Cell Lung Cancer.   Ardeen Fillers, CPhT Oncology Pharmacy Patient Advocate  [facility]  [phone] (phone) [phone] (fax) 06/22/2023 8:18 AM

## 2023-06-22 NOTE — Telephone Encounter (Signed)
Called and spoke to patient regarding grant approval. Patient knows that co-pay for [name] will be $0.00 now. Patient knows to let pharmacy know about billing grant secondary if they miss it during initial billing. Patient also knows to call me at [phone] with any questions or concerns regarding receiving medication from pharmacy or if there is any unexpected change in co-pay.    Ardeen Fillers, CPhT Oncology Pharmacy Patient Advocate  [facility]  [phone] (phone) [phone] (fax) 06/22/2023 8:43 AM

## 2023-06-27 ENCOUNTER — Other Ambulatory Visit: Payer: Self-pay | Admitting: Medical Oncology

## 2023-06-29 ENCOUNTER — Other Ambulatory Visit (HOSPITAL_COMMUNITY): Payer: Self-pay

## 2023-06-29 ENCOUNTER — Other Ambulatory Visit: Payer: Medicare HMO | Admitting: Hematology & Oncology

## 2023-06-29 ENCOUNTER — Other Ambulatory Visit: Payer: Self-pay

## 2023-06-29 DIAGNOSIS — C7951 Secondary malignant neoplasm of bone: Secondary | ICD-10-CM

## 2023-06-29 MED ORDER — LORLATINIB 100 MG PO TABS
100.0000 mg | ORAL_TABLET | Freq: Every day | ORAL | 0 refills | Status: DC
  Filled 2023-06-29: qty 30, 30d supply, fill #0

## 2023-06-29 NOTE — Progress Notes (Signed)
Specialty Pharmacy Ongoing Clinical Assessment Note  Laura Bush is a 72 y.o. female who is being followed by the specialty pharmacy service for RxSp Oncology   Patient's specialty medication(s) reviewed today: Lorlatinib   Missed doses in the last 4 weeks: 0   Patient/Caregiver did not have any additional questions or concerns.   Therapeutic benefit summary: Patient is achieving benefit   Adverse events/side effects summary: No adverse events/side effects   Patient's therapy is appropriate to: Continue    Goals Addressed             This Visit's Progress    Slow Disease Progression   No change    Patient is initiating therapy. Patient will maintain adherence         Follow up:  3 months  Servando Snare Specialty Pharmacist

## 2023-06-29 NOTE — Progress Notes (Signed)
Specialty Pharmacy Refill Coordination Note  Laura Bush is a 72 y.o. female contacted today regarding refills of specialty medication(s) Lorlatinib   Patient requested Delivery   Delivery date: 07/11/23   Verified address: [address] [place] [address]   Medication will be filled on 07/10/23. *Pending Refill Request*

## 2023-06-30 ENCOUNTER — Other Ambulatory Visit: Payer: Self-pay

## 2023-07-03 ENCOUNTER — Ambulatory Visit (HOSPITAL_BASED_OUTPATIENT_CLINIC_OR_DEPARTMENT_OTHER): Payer: Medicare HMO | Admitting: Cardiovascular Disease

## 2023-07-03 ENCOUNTER — Encounter (HOSPITAL_BASED_OUTPATIENT_CLINIC_OR_DEPARTMENT_OTHER): Payer: Self-pay | Admitting: Cardiovascular Disease

## 2023-07-03 VITALS — BP 112/78 | HR 66 | Ht <= 58 in | Wt 184.4 lb

## 2023-07-03 DIAGNOSIS — I2510 Atherosclerotic heart disease of native coronary artery without angina pectoris: Secondary | ICD-10-CM | POA: Diagnosis not present

## 2023-07-03 DIAGNOSIS — I2583 Coronary atherosclerosis due to lipid rich plaque: Secondary | ICD-10-CM | POA: Diagnosis not present

## 2023-07-03 DIAGNOSIS — I517 Cardiomegaly: Secondary | ICD-10-CM

## 2023-07-03 DIAGNOSIS — Z5181 Encounter for therapeutic drug level monitoring: Secondary | ICD-10-CM

## 2023-07-03 DIAGNOSIS — G4733 Obstructive sleep apnea (adult) (pediatric): Secondary | ICD-10-CM | POA: Diagnosis not present

## 2023-07-03 DIAGNOSIS — E7800 Pure hypercholesterolemia, unspecified: Secondary | ICD-10-CM | POA: Diagnosis not present

## 2023-07-03 DIAGNOSIS — I10 Essential (primary) hypertension: Secondary | ICD-10-CM | POA: Diagnosis not present

## 2023-07-03 MED ORDER — SPIRONOLACTONE 25 MG PO TABS: 25.0000 mg | ORAL_TABLET | Freq: Every day | ORAL | 3 refills | Status: DC

## 2023-07-03 NOTE — Progress Notes (Signed)
Cardiology Office Note:  .   Date:  07/03/2023  ID:  Laura Bush, DOB 28-Sep-1950, MRN [id number] PCP: Pearline Cables, MD  [facility] Providers Cardiologist:  Chilton Si, MD    History of Present Illness: .   Laura Bush is a 72 y.o. female with breast cancer s/p lumpectomy and XRT, lung cancer s/p XRT and chemo, GERD, hypertension and hyperlipidemia who presents for follow up.  She was seen 04/2016 with a report of progressive exertional dyspnea. It was especially prominent when walking up an incline. She was referred for an exercise Myoview that was negative for ischemia and revealed LVEF 71%.  She also noted that her heart rate only gets into the 90s with exertion.  Metoprolol was switched to carvedilol. She followed up with our pharmacist in her blood pressure was well-controlled. However amlodipine was discontinued due to lower extremity edema.   HCTZ was increased due to poorly controlled blood pressure.   [name] switched to a plant-based diet in September 2020.    She was frustrated by her lack of weight loss and was referred to the healthy weight and wellness clinic where she had great success. At the time of her 11/2020 visit she felt her weight loss had decreased in the setting of an URI. Her recovery was slow and she complained of fatigue. Her blood pressure was well controlled, and her DOE was improving with her weight loss and exercise.  She was diagnosed with lung cancer in 2023.  She underwent chemotherapy and radiation and has been in remission.  At her appointment 12/2022 she wasn't exercising as much as in the past and coronary calcification was noted on chest imaging.  She was referred for coronary CT-a 12/2022 that revealed a calcium score of 242, which was 83rd percentile.  She had minimal plaque.  Simvastatin was switched to rosuvastatin.  She had lower extremity edema and nifedipine was switched to valsartan.  She follow-up with Gillian Shields, NP  the following month.  Her fatigue had improved and her swelling was a little better.  Blood pressures were well-controlled.  She was referred to the PREP program.  [name] presents with concerns about recent leg swelling. She reports that a few months ago, her cancer medication was causing kidney issues, leading to a significant drop in kidney function. Upon discontinuation of the medication, kidney function improved. She was off cancer medication for approximately six to eight weeks, during which a PET scan showed no signs of recurrence. She has recently started a new cancer medication and has been on it for about two and a half weeks.  [name] also reports that she has been prescribed Lasix and potassium to manage the swelling. She expresses a desire to review her current medications to determine if any could be contributing to the swelling. She mentions a family history of cardiomyopathy, with her father developing the condition in his late sixties.  The patient admits to struggling with exercise due to a previous leg injury and subsequent surgery to remove screws from a rod in her leg and shave off overgrown bone. She expresses a preference for yoga and walking but has been unable to engage in these activities due to the leg issues. She also reports a recent focus on dietary improvements, including increased protein intake, reduced carbohydrate consumption, and minimized red meat intake.      ROS:  Negative other than HPI  Studies Reviewed: .       Echo  01/2023:  1. Left ventricular ejection fraction, by estimation, is 60 to 65%. The  left ventricle has normal function. The left ventricle has no regional  wall motion abnormalities. Left ventricular diastolic parameters are  consistent with Grade I diastolic  dysfunction (impaired relaxation).   2. Right ventricular systolic function is normal. The right ventricular  size is moderately enlarged.   3. The mitral valve is normal in  structure. No evidence of mitral valve  regurgitation. No evidence of mitral stenosis.   4. The aortic valve is normal in structure. Aortic valve regurgitation is  not visualized. No aortic stenosis is present.   5. The inferior vena cava is normal in size with greater than 50%  respiratory variability, suggesting right atrial pressure of 3 mmHg.   Risk Assessment/Calculations:             Physical Exam:    VS:  BP 112/78 (BP Location: Left Arm, Patient Position: Sitting, Cuff Size: Normal)   Pulse 66   Ht 4\' 10"  (1.473 m)   Wt 184 lb 6.4 oz (83.6 kg)   SpO2 95%   BMI 38.54 kg/m  , BMI Body mass index is 38.54 kg/m. GENERAL:  Well appearing HEENT: Pupils equal round and reactive, fundi not visualized, oral mucosa unremarkable NECK:  No jugular venous distention, waveform within normal limits, carotid upstroke brisk and symmetric, no bruits, no thyromegaly LUNGS:  Clear to auscultation bilaterally HEART:  RRR.  PMI not displaced or sustained,S1 and S2 within normal limits, no S3, no S4, no clicks, no rubs, no murmurs ABD:  Flat, positive bowel sounds normal in frequency in pitch, no bruits, no rebound, no guarding, no midline pulsatile mass, no hepatomegaly, no splenomegaly EXT:  2 plus pulses throughout, no edema, +lymphedema bilaterally. No cyanosis no clubbing SKIN:  No rashes no nodules NEURO:  Cranial nerves II through XII grossly intact, motor grossly intact throughout PSYCH:  Cognitively intact, oriented to person place and time   ASSESSMENT AND PLAN: .    # Cancer Treatment-Related Kidney Dysfunction Recent kidney dysfunction due to cancer medication. Medication was stopped and kidney function improved. Currently on a new cancer medication for 2.5 weeks with no reported kidney issues. -Continue current cancer medication regimen.  # Lower Extremity Edema: # LVH:  Noted leg swelling, currently on Lasix 40mg  and Hydrochlorothiazide. Plan to adjust diuretic regimen due to  potential kidney impact and to manage potassium levels. -Discontinue Hydrochlorothiazide and Potassium supplement. -Start Spironolactone 25mg  daily. -Check blood pressure regularly and report readings. -Check basic metabolic panel in 2 weeks.  # Hyperlipidemia LDL 81 in March, goal is under 70. Currently on Pravastatin 3 times a week, but there were concerns about liver function. -Consult with pharmacist to discuss alternative cholesterol medications (Repatha or Leqvio). -Continue Pravastatin until further discussion with pharmacist.  # Mild LVH:  Noted mild thickening of heart muscle on echo in June. No symptoms of heart failure. Family history of cardiomyopathy. -Repeat echocardiogram in June 2025. -Continue Valsartan for blood pressure control.  General Health Maintenance / Follow-up Plans -Encouraged to incorporate 5 minutes of daily exercise, such as chair yoga. -Plan for follow-up after repeat echocardiogram in June 2025.       Signed, Chilton Si, MD

## 2023-07-03 NOTE — Patient Instructions (Signed)
Medication Instructions:  STOP POTASSIUM   STOP HYDROCHLOROTHIAZIDE  START SPIRONOLACTONE 25 MG DAILY    *If you need a refill on your cardiac medications before your next appointment, please call your pharmacy*  Lab Work: CMET AT YOUR ONCOLOGY OFFICE WHEN YOU HAVE OTHER LABS  If you have labs (blood work) drawn today and your tests are completely normal, you will receive your results only by: MyChart Message (if you have MyChart) OR A paper copy in the mail If you have any lab test that is abnormal or we need to change your treatment, we will call you to review the results.  Testing/Procedures: Your physician has requested that you have an echocardiogram. Echocardiography is a painless test that uses sound waves to create images of your heart. It provides your doctor with information about the size and shape of your heart and how well your heart's chambers and valves are working. This procedure takes approximately one hour. There are no restrictions for this procedure. Please do NOT wear cologne, perfume, aftershave, or lotions (deodorant is allowed). Please arrive 15 minutes prior to your appointment time.  Please note: We ask at that you not bring children with you during ultrasound (echo/ vascular) testing. Due to room size and safety concerns, children are not allowed in the ultrasound rooms during exams. Our front office staff cannot provide observation of children in our lobby area while testing is being conducted. An adult accompanying a patient to their appointment will only be allowed in the ultrasound room at the discretion of the ultrasound technician under special circumstances. We apologize for any inconvenience.  TO BE DONE IN JUNE ABOUT 1 WEEK PRIOR TO FOLLOW UP   Follow-Up: At [facility], you and your health needs are our priority.  As part of our continuing mission to provide you with exceptional heart care, we have created designated Provider Care Teams.  These  Care Teams include your primary Cardiologist (physician) and Advanced Practice Providers (APPs -  Physician Assistants and Nurse Practitioners) who all work together to provide you with the care you need, when you need it.  We recommend signing up for the patient portal called "MyChart".  Sign up information is provided on this After Visit Summary.  MyChart is used to connect with patients for Virtual Visits (Telemedicine).  Patients are able to view lab/test results, encounter notes, upcoming appointments, etc.  Non-urgent messages can be sent to your provider as well.   To learn more about what you can do with MyChart, go to [email or web address].    Your next appointment:   6 month(s)  Provider:   Chilton Si, MD or Gillian Shields, NP AFTER ECHO   PHARM D FOR LIPID MANAGEMENT    Other Instructions TRY TO EXERCISE DAILY FOR AT LEAST 5 MINUTES

## 2023-07-04 ENCOUNTER — Telehealth: Payer: Self-pay | Admitting: Family Medicine

## 2023-07-04 ENCOUNTER — Inpatient Hospital Stay: Payer: Medicare HMO

## 2023-07-04 VITALS — BP 128/77 | HR 63 | Temp 97.7°F | Resp 18

## 2023-07-04 DIAGNOSIS — C3490 Malignant neoplasm of unspecified part of unspecified bronchus or lung: Secondary | ICD-10-CM

## 2023-07-04 DIAGNOSIS — E611 Iron deficiency: Secondary | ICD-10-CM | POA: Diagnosis not present

## 2023-07-04 MED ORDER — SODIUM CHLORIDE 0.9 % IV SOLN
300.0000 mg | Freq: Once | INTRAVENOUS | Status: AC
  Administered 2023-07-04: 300 mg via INTRAVENOUS
  Filled 2023-07-04: qty 300

## 2023-07-04 MED ORDER — SODIUM CHLORIDE 0.9 % IV SOLN: INTRAVENOUS | Status: DC

## 2023-07-04 NOTE — Patient Instructions (Signed)
Iron Sucrose Injection What is this medication? IRON SUCROSE (EYE ern SOO krose) treats low levels of iron (iron deficiency anemia) in people with kidney disease. Iron is a mineral that plays an important role in making red blood cells, which carry oxygen from your lungs to the rest of your body. This medicine may be used for other purposes; ask your health care provider or pharmacist if you have questions. COMMON BRAND NAME(S): Venofer What should I tell my care team before I take this medication? They need to know if you have any of these conditions: Anemia not caused by low iron levels Heart disease High levels of iron in the blood Kidney disease Liver disease An unusual or allergic reaction to iron, other medications, foods, dyes, or preservatives Pregnant or trying to get pregnant Breastfeeding How should I use this medication? This medication is for infusion into a vein. It is given in a hospital or clinic setting. Talk to your care team about the use of this medication in children. While this medication may be prescribed for children as young as 2 years for selected conditions, precautions do apply. Overdosage: If you think you have taken too much of this medicine contact a poison control center or emergency room at once. NOTE: This medicine is only for you. Do not share this medicine with others. What if I miss a dose? Keep appointments for follow-up doses. It is important not to miss your dose. Call your care team if you are unable to keep an appointment. What may interact with this medication? Do not take this medication with any of the following: Deferoxamine Dimercaprol Other iron products This medication may also interact with the following: Chloramphenicol Deferasirox This list may not describe all possible interactions. Give your health care provider a list of all the medicines, herbs, non-prescription drugs, or dietary supplements you use. Also tell them if you smoke,  drink alcohol, or use illegal drugs. Some items may interact with your medicine. What should I watch for while using this medication? Visit your care team regularly. Tell your care team if your symptoms do not start to get better or if they get worse. You may need blood work done while you are taking this medication. You may need to follow a special diet. Talk to your care team. Foods that contain iron include: whole grains/cereals, dried fruits, beans, or peas, leafy green vegetables, and organ meats (liver, kidney). What side effects may I notice from receiving this medication? Side effects that you should report to your care team as soon as possible: Allergic reactions--skin rash, itching, hives, swelling of the face, lips, tongue, or throat Low blood pressure--dizziness, feeling faint or lightheaded, blurry vision Shortness of breath Side effects that usually do not require medical attention (report to your care team if they continue or are bothersome): Flushing Headache Joint pain Muscle pain Nausea Pain, redness, or irritation at injection site This list may not describe all possible side effects. Call your doctor for medical advice about side effects. You may report side effects to FDA at 1-800-FDA-1088. Where should I keep my medication? This medication is given in a hospital or clinic. It will not be stored at home. NOTE: This sheet is a summary. It may not cover all possible information. If you have questions about this medicine, talk to your doctor, pharmacist, or health care provider.  2024 Elsevier/Gold Standard (2022-12-30 00:00:00)

## 2023-07-04 NOTE — Telephone Encounter (Signed)
Pt last seen 07/20/2021

## 2023-07-04 NOTE — Telephone Encounter (Signed)
Pt scheduled an appt due to needing a different mask.

## 2023-07-10 ENCOUNTER — Inpatient Hospital Stay: Payer: Medicare HMO | Attending: Hematology & Oncology

## 2023-07-10 ENCOUNTER — Other Ambulatory Visit: Payer: Self-pay

## 2023-07-10 VITALS — BP 126/70 | HR 71 | Temp 98.0°F | Resp 19

## 2023-07-10 DIAGNOSIS — C3432 Malignant neoplasm of lower lobe, left bronchus or lung: Secondary | ICD-10-CM | POA: Insufficient documentation

## 2023-07-10 DIAGNOSIS — C7951 Secondary malignant neoplasm of bone: Secondary | ICD-10-CM

## 2023-07-10 DIAGNOSIS — E611 Iron deficiency: Secondary | ICD-10-CM | POA: Diagnosis not present

## 2023-07-10 MED ORDER — SODIUM CHLORIDE 0.9 % IV SOLN
INTRAVENOUS | Status: DC
Start: 2023-07-10 — End: 2023-07-10

## 2023-07-10 MED ORDER — SODIUM CHLORIDE 0.9 % IV SOLN
300.0000 mg | Freq: Once | INTRAVENOUS | Status: AC
  Administered 2023-07-10: 300 mg via INTRAVENOUS
  Filled 2023-07-10: qty 300

## 2023-07-10 NOTE — Patient Instructions (Signed)
Iron Sucrose Injection What is this medication? IRON SUCROSE (EYE ern SOO krose) treats low levels of iron (iron deficiency anemia) in people with kidney disease. Iron is a mineral that plays an important role in making red blood cells, which carry oxygen from your lungs to the rest of your body. This medicine may be used for other purposes; ask your health care provider or pharmacist if you have questions. COMMON BRAND NAME(S): Venofer What should I tell my care team before I take this medication? They need to know if you have any of these conditions: Anemia not caused by low iron levels Heart disease High levels of iron in the blood Kidney disease Liver disease An unusual or allergic reaction to iron, other medications, foods, dyes, or preservatives Pregnant or trying to get pregnant Breastfeeding How should I use this medication? This medication is for infusion into a vein. It is given in a hospital or clinic setting. Talk to your care team about the use of this medication in children. While this medication may be prescribed for children as young as 2 years for selected conditions, precautions do apply. Overdosage: If you think you have taken too much of this medicine contact a poison control center or emergency room at once. NOTE: This medicine is only for you. Do not share this medicine with others. What if I miss a dose? Keep appointments for follow-up doses. It is important not to miss your dose. Call your care team if you are unable to keep an appointment. What may interact with this medication? Do not take this medication with any of the following: Deferoxamine Dimercaprol Other iron products This medication may also interact with the following: Chloramphenicol Deferasirox This list may not describe all possible interactions. Give your health care provider a list of all the medicines, herbs, non-prescription drugs, or dietary supplements you use. Also tell them if you smoke,  drink alcohol, or use illegal drugs. Some items may interact with your medicine. What should I watch for while using this medication? Visit your care team regularly. Tell your care team if your symptoms do not start to get better or if they get worse. You may need blood work done while you are taking this medication. You may need to follow a special diet. Talk to your care team. Foods that contain iron include: whole grains/cereals, dried fruits, beans, or peas, leafy green vegetables, and organ meats (liver, kidney). What side effects may I notice from receiving this medication? Side effects that you should report to your care team as soon as possible: Allergic reactions--skin rash, itching, hives, swelling of the face, lips, tongue, or throat Low blood pressure--dizziness, feeling faint or lightheaded, blurry vision Shortness of breath Side effects that usually do not require medical attention (report to your care team if they continue or are bothersome): Flushing Headache Joint pain Muscle pain Nausea Pain, redness, or irritation at injection site This list may not describe all possible side effects. Call your doctor for medical advice about side effects. You may report side effects to FDA at 1-800-FDA-1088. Where should I keep my medication? This medication is given in a hospital or clinic. It will not be stored at home. NOTE: This sheet is a summary. It may not cover all possible information. If you have questions about this medicine, talk to your doctor, pharmacist, or health care provider.  2024 Elsevier/Gold Standard (2022-12-30 00:00:00)

## 2023-07-17 ENCOUNTER — Ambulatory Visit (HOSPITAL_BASED_OUTPATIENT_CLINIC_OR_DEPARTMENT_OTHER): Payer: Medicare HMO | Admitting: Pharmacist Clinician (PhC)/ Clinical Pharmacy Specialist

## 2023-07-17 VITALS — HR 68

## 2023-07-17 DIAGNOSIS — I10 Essential (primary) hypertension: Secondary | ICD-10-CM

## 2023-07-17 DIAGNOSIS — E782 Mixed hyperlipidemia: Secondary | ICD-10-CM | POA: Diagnosis not present

## 2023-07-17 DIAGNOSIS — R600 Localized edema: Secondary | ICD-10-CM

## 2023-07-17 DIAGNOSIS — Z5181 Encounter for therapeutic drug level monitoring: Secondary | ICD-10-CM | POA: Diagnosis not present

## 2023-07-17 MED ORDER — NIFEDIPINE ER OSMOTIC RELEASE 30 MG PO TB24: 30.0000 mg | ORAL_TABLET | Freq: Every day | ORAL | 1 refills | Status: DC

## 2023-07-17 NOTE — Assessment & Plan Note (Signed)
Blood pressure well controlled in the office as well as at home.  Since stopping nifedipine has had flares of her Raynaud's and wishes to go back on, at least through the winter months.  She is aware of the increased risk of lower extremity edema, which is already present, secondary to chemotherapy.   Will discontinue valsartan and re-start nifedipine xl 30 mg daily

## 2023-07-17 NOTE — Assessment & Plan Note (Signed)
Patient continues to have edema, despite furosemide 40 mg daily, amiloride 5 mg daily and spironolactone 25 mg daily.  Potassium supplement was stopped when spironolactone was added.  Patient reports having labs drawn today, will wait to see renal function and potassium levels.  May consider d/c amiloride and increase spironolactone as both are potassium sparing.  For now asked that she increase furosemide to 60 mg for 3 days.  Will see if any change at that time.  Oncology mentioned trying metolazone, may consider if no improvement.

## 2023-07-17 NOTE — Progress Notes (Signed)
Office Visit    Patient Name: Laura Bush Date of Encounter: 07/17/2023  Primary Care Provider:  Pearline Cables, MD Primary Cardiologist:  Laura Si, MD  Chief Complaint    Hyperlipidemia   Significant Past Medical History   HTN Controlled on amiloride 5, spironolactone 25, valsartan 80  LVH Leg swelling noted, on furosemide 40 mg and spironolactone 25 mg  Lung cancer Previous chemo was affecting renal function, doing better with current regimen.    Raynaud's Did well on nifedipine, already had episodes since d/c  CAD CAC = 242 (83rd percentile), all in LAD     Allergies  Allergen Reactions   Diclofenac Hypertension   Amlodipine Swelling and Other (See Comments)    Limbs swell, not the throat   Lanolin Other (See Comments)    Sneezing and watery eyes. Allergic to [facility].   Tape Other (See Comments)    Redness (Bandaids also)    History of Present Illness    Laura Bush is a 72 y.o. female patient of Laura Bush, in the office today to discuss options for cholesterol management.  She is currently taking pravastatin 20 mg three times weekly.  Previously she was on rosuvastatin, but that was discontinued secondary to a rise in LFT's.  They have dropped back down to normal range since starting the pravastatin.  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   Unfortunately her current chemotherapy combination is causing her to have edema in her legs/feet.  Because of this, when she saw Laura. Duke Bush, her nifedipine xl 30 was switched to valsartan 80 mg daily.  She notes home blood pressure readings all WNL.  [name] also suffers from Raynaud's, and since stopping the nifedipine, has had flare ups.  She would prefer to go back on the nifedipine.     Insurance Carrier:  [employer]  LDL Cholesterol goal:  LDL < 70  Current Medications:     Cholesterol:  pravastatin 20  mg three times weekly  Blood pressure/edema:  valsartan 80 mg every day, amiloride 5 mg every day, furosemide 40 mg every day, spironolactone 25 mg every day  Social Hx: Tobacco: no Alcohol: occasional   Diet:   plant based since 2020   Accessory Clinical Findings   Lab Results  Component Value Date   CHOL 163 10/26/2022   HDL 63.40 10/26/2022   LDLCALC 81 10/26/2022   TRIG 96.0 10/26/2022   CHOLHDL 3 10/26/2022    No results found for: "LIPOA"  Lab Results  Component Value Date   ALT 20 06/21/2023   AST 19 06/21/2023   ALKPHOS 57 06/21/2023   BILITOT 0.2 06/21/2023   Lab Results  Component Value Date   CREATININE 0.91 06/21/2023   BUN 24 (H) 06/21/2023   NA 139 06/21/2023   K 3.4 (L) 06/21/2023   CL 100 06/21/2023   CO2 30 06/21/2023   Lab Results  Component Value Date   HGBA1C 5.5 10/26/2022    Home Medications    Current Outpatient Medications  Medication Sig Dispense Refill   NIFEdipine (PROCARDIA-XL/NIFEDICAL-XL) 30 MG 24 hr tablet Take 1 tablet (30 mg total) by mouth daily. 90 tablet 1   acetaminophen (TYLENOL) 500 MG tablet Take 1 tablet (500 mg total) by mouth every 8 (eight) hours as needed for mild pain or moderate pain. 30 tablet 0   aMILoride (MIDAMOR) 5 MG tablet Take 1 tablet (5 mg  total) by mouth daily. 90 tablet 0   aspirin EC 81 MG tablet Take 81 mg by mouth in the morning. Swallow whole.     B Complex-C (SUPER B COMPLEX PO) Take 1 tablet by mouth daily with breakfast.     Calcium Carb-Cholecalciferol (CALCIUM + D3 PO) Take 1 tablet by mouth in the morning.     cholecalciferol (VITAMIN D3) 25 MCG (1000 UNIT) tablet Take 1,000 Units by mouth in the morning.     COLLAGEN PO Take 3 tablets by mouth in the morning and at bedtime. With Biotin     Denosumab (XGEVA [place]) Inject into the skin every 3 (three) months.     fexofenadine (ALLEGRA) 180 MG tablet Take 180 mg by mouth in the morning.     folic acid (FOLATE) 400 MCG tablet Take 400 mcg by mouth  in the morning.     furosemide (LASIX) 20 MG tablet TAKE 1 TABLET BY MOUTH DAILY AS NEEDED 30 tablet 3   ibuprofen (ADVIL) 200 MG tablet Take 600 mg by mouth daily as needed.     ipratropium (ATROVENT) 0.06 % nasal spray Place 2 sprays into both nostrils 4 (four) times daily as needed for rhinitis. (Patient not taking: Reported on 07/03/2023) 15 mL 4   lorlatinib (LORBRENA) 100 MG tablet Take 1 tablet (100 mg total) by mouth daily. Swallow tablets whole. Do not chew, crush or split tablets. 30 tablet 0   metFORMIN (GLUCOPHAGE) 500 MG tablet TAKE TWO TABELTS AT LUNCH AND ONE TABLET AT DINNER (Patient taking differently: Take 1,000 mg by mouth daily at 12 noon.) 270 tablet 0   pantoprazole (PROTONIX) 40 MG tablet TAKE ONE TABLET BY MOUTH DAILY (Patient taking differently: Take 40 mg by mouth daily as needed (indigestion/heartburn.).) 90 tablet 3   polyethylene glycol (MIRALAX / GLYCOLAX) 17 g packet Take 17 g by mouth daily as needed (constipation).     pravastatin (PRAVACHOL) 20 MG tablet Take 1 tablet (20 mg total) by mouth 3 (three) times a week. (Patient taking differently: Take 20 mg by mouth every Monday, Wednesday, and Friday at 8 PM.) 40 tablet 3   spironolactone (ALDACTONE) 25 MG tablet Take 1 tablet (25 mg total) by mouth daily. 90 tablet 3   traZODone (DESYREL) 50 MG tablet TAKE 1 TABLET BY MOUTH EVERY NIGHT AT BEDTIME AS NEEDED FOR SLEEP (Patient taking differently: Take 50 mg by mouth at bedtime. for sleep) 90 tablet 2   No current facility-administered medications for this visit.     Assessment & Plan    Mixed hyperlipidemia Assessment: Patient with ASCVD not at LDL goal of < 70 Most recent LDL 81 on 10/26/22 - had labs drawn earlier today Has been compliant with low intensity statin: pravastatin 20 mg three times weekly  Not able to tolerate rosuvastatin secondary to elevated LFT's Reviewed options for lowering LDL cholesterol, including PCSK-9 inhibitors and inclisiran.  Discussed  mechanisms of action, dosing, side effects, potential decreases in LDL cholesterol and costs.  Also reviewed potential options for patient assistance.  Plan: Patient agreeable to starting Repatha 140 mg q14d Repeat labs after:  3 months Lipid Liver function Patient was given information on [employer] - will determine eligibility with patient when PA approved Marital status - married Income < $72,000 (single) or < $102,000 (married)   Primary hypertension Blood pressure well controlled in the office as well as at home.  Since stopping nifedipine has had flares of her Raynaud's and wishes to  go back on, at least through the winter months.  She is aware of the increased risk of lower extremity edema, which is already present, secondary to chemotherapy.   Will discontinue valsartan and re-start nifedipine xl 30 mg daily  Lower extremity edema Patient continues to have edema, despite furosemide 40 mg daily, amiloride 5 mg daily and spironolactone 25 mg daily.  Potassium supplement was stopped when spironolactone was added.  Patient reports having labs drawn today, will wait to see renal function and potassium levels.  May consider d/c amiloride and increase spironolactone as both are potassium sparing.  For now asked that she increase furosemide to 60 mg for 3 days.  Will see if any change at that time.  Oncology mentioned trying metolazone, may consider if no improvement.     Phillips Hay, PharmD CPP [facility] [address]  [place], [place] [address] [phone]  07/17/2023, 5:04 PM

## 2023-07-17 NOTE — Assessment & Plan Note (Signed)
Assessment: Patient with ASCVD not at LDL goal of < 70 Most recent LDL 81 on 10/26/22 - had labs drawn earlier today Has been compliant with low intensity statin: pravastatin 20 mg three times weekly  Not able to tolerate rosuvastatin secondary to elevated LFT's Reviewed options for lowering LDL cholesterol, including PCSK-9 inhibitors and inclisiran.  Discussed mechanisms of action, dosing, side effects, potential decreases in LDL cholesterol and costs.  Also reviewed potential options for patient assistance.  Plan: Patient agreeable to starting Repatha 140 mg q14d Repeat labs after:  3 months Lipid Liver function Patient was given information on [employer] - will determine eligibility with patient when PA approved Marital status - married Income < $72,000 (single) or < $102,000 (married)

## 2023-07-17 NOTE — Patient Instructions (Signed)
Your Results:             Your most recent labs Goal  Total Cholesterol 163 < 200  Triglycerides 96 < 150  HDL (happy/good cholesterol) 63 > 40  LDL (lousy/bad cholesterol 81 < 70   Medication changes:  We will start the process to get Repatha covered by your insurance.  Once the prior authorization is complete, I will call/send a MyChart message to let you know and confirm pharmacy information.   You will take one injection every 14 days  Lab orders:  We want to repeat labs after 2-3 months.  We will send you a lab order to remind you once we get closer to that time.    Patient Assistance:    We will sign you up for a [employer] Grant once your medication is approved by [employer].  I will call you with the ID number, then you will take this information to the pharmacy.  They will bill it after your insurance, bringing your copay to $0.  The grant will pay the first $2,500 in a one year period.    ID   BIN [id number]  PCN [id number]  GRP [id number]   Stop valsartan.  Re-start nifedipine xl 30 mg once daily. Increase furosemide to 60 mg for 3 days.   Thank you for choosing [facility]

## 2023-07-18 LAB — COMPREHENSIVE METABOLIC PANEL
ALT: 36 [IU]/L — ABNORMAL HIGH (ref 0–32)
AST: 24 [IU]/L (ref 0–40)
Albumin: 4.2 g/dL (ref 3.8–4.8)
Alkaline Phosphatase: 52 [IU]/L (ref 44–121)
BUN/Creatinine Ratio: 21 (ref 12–28)
BUN: 26 mg/dL (ref 8–27)
Bilirubin Total: <0.2 mg/dL (ref 0.0–1.2)
CO2: 24 mmol/L (ref 20–29)
Calcium: 9.3 mg/dL (ref 8.7–10.3)
Chloride: 101 mmol/L (ref 96–106)
Creatinine, Ser: 1.22 mg/dL — ABNORMAL HIGH (ref 0.57–1.00)
Globulin, Total: 2.4 g/dL (ref 1.5–4.5)
Glucose: 135 mg/dL — ABNORMAL HIGH (ref 70–99)
Potassium: 3.3 mmol/L — ABNORMAL LOW (ref 3.5–5.2)
Sodium: 142 mmol/L (ref 134–144)
Total Protein: 6.6 g/dL (ref 6.0–8.5)
eGFR: 47 mL/min/{1.73_m2} — ABNORMAL LOW (ref >59)

## 2023-07-19 ENCOUNTER — Telehealth: Payer: Self-pay | Admitting: Pharmacist Clinician (PhC)/ Clinical Pharmacy Specialist

## 2023-07-19 ENCOUNTER — Other Ambulatory Visit: Payer: Self-pay | Admitting: Pharmacist Clinician (PhC)/ Clinical Pharmacy Specialist

## 2023-07-19 ENCOUNTER — Other Ambulatory Visit (HOSPITAL_COMMUNITY): Payer: Self-pay

## 2023-07-19 ENCOUNTER — Telehealth: Payer: Self-pay | Admitting: Pharmacy Technician

## 2023-07-19 DIAGNOSIS — I10 Essential (primary) hypertension: Secondary | ICD-10-CM

## 2023-07-19 MED ORDER — POTASSIUM CHLORIDE CRYS ER 20 MEQ PO TBCR: 20.0000 meq | EXTENDED_RELEASE_TABLET | Freq: Two times a day (BID) | ORAL | Status: DC

## 2023-07-19 NOTE — Telephone Encounter (Signed)
Pharmacy Patient Advocate Encounter   Received notification from Pt Calls Messages that prior authorization for repatha is required/requested.   Insurance verification completed.   The patient is insured through [facility] .   Per test claim: PA required; PA submitted to above mentioned insurance via CoverMyMeds Key/confirmation #/EOC [id number] Status is pending

## 2023-07-19 NOTE — Telephone Encounter (Signed)
Please do PA for Repatha

## 2023-07-19 NOTE — Telephone Encounter (Signed)
Pharmacy Patient Advocate Encounter  Received notification from [facility] that Prior Authorization for repatha has been APPROVED from 08/08/22 to 08/08/23. Ran test claim, Copay is $0.00 one month. This test claim was processed through [facility]- copay amounts may vary at other pharmacies due to pharmacy/plan contracts, or as the patient moves through the different stages of their insurance plan.   PA #/Case ID/Reference #: [id number]

## 2023-07-22 ENCOUNTER — Other Ambulatory Visit (HOSPITAL_COMMUNITY): Payer: Self-pay

## 2023-07-22 MED ORDER — REPATHA SURECLICK 140 MG/ML ~~LOC~~ SOAJ
140.0000 mg | SUBCUTANEOUS | 3 refills | Status: DC
  Filled 2023-07-22: qty 6, 84d supply, fill #0
  Filled 2023-09-27 (×2): qty 6, 84d supply, fill #1
  Filled 2023-12-27: qty 6, 84d supply, fill #2
  Filled 2024-03-11: qty 6, 84d supply, fill #3

## 2023-07-22 NOTE — Addendum Note (Signed)
Addended by: Rosalee Kaufman on: 07/22/2023 08:56 AM   Modules accepted: Orders

## 2023-07-24 ENCOUNTER — Other Ambulatory Visit (HOSPITAL_COMMUNITY): Payer: Self-pay

## 2023-07-25 ENCOUNTER — Inpatient Hospital Stay: Payer: Medicare HMO

## 2023-07-25 ENCOUNTER — Inpatient Hospital Stay: Payer: Medicare HMO | Admitting: Hematology & Oncology

## 2023-07-28 ENCOUNTER — Ambulatory Visit (HOSPITAL_BASED_OUTPATIENT_CLINIC_OR_DEPARTMENT_OTHER): Payer: Medicare HMO

## 2023-07-28 ENCOUNTER — Inpatient Hospital Stay: Payer: Medicare HMO | Admitting: Hematology & Oncology

## 2023-07-28 ENCOUNTER — Inpatient Hospital Stay: Payer: Medicare HMO

## 2023-07-28 ENCOUNTER — Other Ambulatory Visit: Payer: Self-pay | Admitting: *Deleted

## 2023-07-28 ENCOUNTER — Ambulatory Visit: Payer: Medicare HMO

## 2023-07-28 VITALS — BP 125/66 | HR 72 | Temp 98.3°F | Resp 17 | Ht <= 58 in | Wt 184.0 lb

## 2023-07-28 DIAGNOSIS — D631 Anemia in chronic kidney disease: Secondary | ICD-10-CM | POA: Diagnosis not present

## 2023-07-28 DIAGNOSIS — C3490 Malignant neoplasm of unspecified part of unspecified bronchus or lung: Secondary | ICD-10-CM

## 2023-07-28 DIAGNOSIS — N183 Chronic kidney disease, stage 3 unspecified: Secondary | ICD-10-CM

## 2023-07-28 DIAGNOSIS — C3432 Malignant neoplasm of lower lobe, left bronchus or lung: Secondary | ICD-10-CM | POA: Diagnosis not present

## 2023-07-28 DIAGNOSIS — E611 Iron deficiency: Secondary | ICD-10-CM | POA: Diagnosis not present

## 2023-07-28 DIAGNOSIS — C7951 Secondary malignant neoplasm of bone: Secondary | ICD-10-CM | POA: Diagnosis not present

## 2023-07-28 LAB — CBC WITH DIFFERENTIAL (CANCER CENTER ONLY)
Abs Immature Granulocytes: 0.09 10*3/uL — ABNORMAL HIGH (ref 0.00–0.07)
Basophils Absolute: 0 10*3/uL (ref 0.0–0.1)
Basophils Relative: 1 %
Eosinophils Absolute: 0.3 10*3/uL (ref 0.0–0.5)
Eosinophils Relative: 5 %
HCT: 36.3 % (ref 36.0–46.0)
Hemoglobin: 12 g/dL (ref 12.0–15.0)
Immature Granulocytes: 2 %
Lymphocytes Relative: 13 %
Lymphs Abs: 0.8 10*3/uL (ref 0.7–4.0)
MCH: 32 pg (ref 26.0–34.0)
MCHC: 33.1 g/dL (ref 30.0–36.0)
MCV: 96.8 fL (ref 80.0–100.0)
Monocytes Absolute: 1 10*3/uL (ref 0.1–1.0)
Monocytes Relative: 16 %
Neutro Abs: 3.9 10*3/uL (ref 1.7–7.7)
Neutrophils Relative %: 63 %
Platelet Count: 162 10*3/uL (ref 150–400)
RBC: 3.75 MIL/uL — ABNORMAL LOW (ref 3.87–5.11)
RDW: 15.1 % (ref 11.5–15.5)
WBC Count: 6 10*3/uL (ref 4.0–10.5)
nRBC: 0 % (ref 0.0–0.2)

## 2023-07-28 LAB — CMP (CANCER CENTER ONLY)
ALT: 29 U/L (ref 0–44)
AST: 24 U/L (ref 15–41)
Albumin: 4.2 g/dL (ref 3.5–5.0)
Alkaline Phosphatase: 49 U/L (ref 38–126)
Anion gap: 10 (ref 5–15)
BUN: 28 mg/dL — ABNORMAL HIGH (ref 8–23)
CO2: 27 mmol/L (ref 22–32)
Calcium: 9.9 mg/dL (ref 8.9–10.3)
Chloride: 104 mmol/L (ref 98–111)
Creatinine: 1.05 mg/dL — ABNORMAL HIGH (ref 0.44–1.00)
GFR, Estimated: 56 mL/min — ABNORMAL LOW (ref >60)
Glucose, Bld: 100 mg/dL — ABNORMAL HIGH (ref 70–99)
Potassium: 4.4 mmol/L (ref 3.5–5.1)
Sodium: 141 mmol/L (ref 135–145)
Total Bilirubin: 0.3 mg/dL (ref <1.2)
Total Protein: 7.3 g/dL (ref 6.5–8.1)

## 2023-07-28 LAB — FERRITIN: Ferritin: 423 ng/mL — ABNORMAL HIGH (ref 11–307)

## 2023-07-28 LAB — RETICULOCYTES
Immature Retic Fract: 12.7 % (ref 2.3–15.9)
RBC.: 3.8 MIL/uL — ABNORMAL LOW (ref 3.87–5.11)
Retic Count, Absolute: 63.8 10*3/uL (ref 19.0–186.0)
Retic Ct Pct: 1.7 % (ref 0.4–3.1)

## 2023-07-28 LAB — LACTATE DEHYDROGENASE: LDH: 184 U/L (ref 98–192)

## 2023-07-28 MED ORDER — FUROSEMIDE 20 MG PO TABS: 40.0000 mg | ORAL_TABLET | Freq: Every day | ORAL | 4 refills | Status: DC

## 2023-07-28 MED ORDER — FUROSEMIDE 20 MG PO TABS: ORAL_TABLET | ORAL | 4 refills | Status: DC

## 2023-07-28 NOTE — Progress Notes (Signed)
Hematology and Oncology Follow Up Visit  Laura Bush [id number] November 17, 1950 72 y.o. 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 3 months  -next dose in 08/2023   S/P SBRT to LUL -- completed on 03/04/2022 S/P XRT to RIGHT femur -- completed on 03/04/2022 Alecensa 600 mg po BID -- start on 03/24/2022 --on hold starting 04/04/2023 -restart 300 mg p.o. twice daily on 05/04/2023 --DC on 05/26/2023 Lorbrena 100 mg po q day  -- start on 06/14/2023 Aranesp 300 mcg subcu every 3 weeks     Interim History:  [name] is back for follow-up.  She looks fantastic.  She is doing quite well.  That a wonderful Thanksgiving.  They are getting ready to go on a big [place] cruise in January..    She is doing well on the [place]  There is been no problems with nausea or vomiting.  She is having no problems with diarrhea.  There is been no bleeding.  She does have leg swelling.  She is on Lasix now.  She is also on some potassium.  She is really responded well to the Aranesp.  Her hemoglobin has come up quite nicely.  She has more energy.  Her last iron studies that were done back in November showed a ferritin of 26 and iron saturation of 12%.  We did going give her some Venofer.  Again I think this really has helped her out.  She is not have any problems with pain.  Her hips are doing pretty well.  There is been no problems with headache.  Currently, I would say that her performance status is probably ECOG 1. .    Medications:  Current Outpatient Medications:    acetaminophen (TYLENOL) 500 MG tablet, Take 1 tablet (500 mg total) by mouth every 8 (eight) hours as needed for mild pain or moderate pain., Disp: 30 tablet, Rfl: 0   aMILoride (MIDAMOR) 5 MG tablet, Take 1 tablet (5 mg total)  by mouth daily., Disp: 90 tablet, Rfl: 0   aspirin EC 81 MG tablet, Take 81 mg by mouth in the morning. Swallow whole., Disp: , Rfl:    B Complex-C (SUPER B COMPLEX PO), Take 1 tablet by mouth daily with breakfast., Disp: , Rfl:    Calcium Carb-Cholecalciferol (CALCIUM + D3 PO), Take 1 tablet by mouth in the morning., Disp: , Rfl:    cholecalciferol (VITAMIN D3) 25 MCG (1000 UNIT) tablet, Take 1,000 Units by mouth in the morning., Disp: , Rfl:    COLLAGEN PO, Take 3 tablets by mouth in the morning and at bedtime. With Biotin, Disp: , Rfl:    Denosumab (XGEVA [place]), Inject into the skin every 3 (three) months., Disp: , Rfl:    Evolocumab (REPATHA SURECLICK) 140 MG/ML SOAJ, Inject 140 mg into the skin every 14 (fourteen) days., Disp: 6 mL, Rfl: 3   fexofenadine (ALLEGRA) 180 MG tablet, Take 180 mg by mouth in the morning., Disp: , Rfl:    folic acid (FOLATE) 400 MCG tablet, Take 400 mcg by mouth in the morning., Disp: , Rfl:    furosemide (LASIX) 20 MG tablet, TAKE 1 TABLET BY MOUTH DAILY AS NEEDED (Patient taking differently: 40 mg. TAKE 1 TABLET BY MOUTH DAILY AS NEEDED), Disp: 30 tablet, Rfl: 3   ibuprofen (  ADVIL) 200 MG tablet, Take 600 mg by mouth daily as needed., Disp: , Rfl:    ipratropium (ATROVENT) 0.06 % nasal spray, Place 2 sprays into both nostrils 4 (four) times daily as needed for rhinitis., Disp: 15 mL, Rfl: 4   lorlatinib (LORBRENA) 100 MG tablet, Take 1 tablet (100 mg total) by mouth daily. Swallow tablets whole. Do not chew, crush or split tablets., Disp: 30 tablet, Rfl: 0   metFORMIN (GLUCOPHAGE) 500 MG tablet, TAKE TWO TABELTS AT LUNCH AND ONE TABLET AT DINNER (Patient taking differently: Take 1,000 mg by mouth daily at 12 noon.), Disp: 270 tablet, Rfl: 0   NIFEdipine (PROCARDIA-XL/NIFEDICAL-XL) 30 MG 24 hr tablet, Take 1 tablet (30 mg total) by mouth daily., Disp: 90 tablet, Rfl: 1   pantoprazole (PROTONIX) 40 MG tablet, TAKE ONE TABLET BY MOUTH DAILY, Disp: 90 tablet, Rfl: 3    polyethylene glycol (MIRALAX / GLYCOLAX) 17 g packet, Take 17 g by mouth daily as needed (constipation)., Disp: , Rfl:    potassium chloride SA (KLOR-CON M) 20 MEQ tablet, Take 1 tablet (20 mEq total) by mouth 2 (two) times daily., Disp: , Rfl:    spironolactone (ALDACTONE) 25 MG tablet, Take 1 tablet (25 mg total) by mouth daily., Disp: 90 tablet, Rfl: 3   traZODone (DESYREL) 50 MG tablet, TAKE 1 TABLET BY MOUTH EVERY NIGHT AT BEDTIME AS NEEDED FOR SLEEP (Patient taking differently: Take 50 mg by mouth at bedtime. for sleep), Disp: 90 tablet, Rfl: 2  Allergies:  Allergies  Allergen Reactions   Diclofenac Hypertension   Amlodipine Swelling and Other (See Comments)    Limbs swell, not the throat   Lanolin Other (See Comments)    Sneezing and watery eyes. Allergic to [facility].   Tape Other (See Comments)    Redness (Bandaids also)    Past Medical History, Surgical history, Social history, and Family History were reviewed and updated.  Review of Systems: Review of Systems  Constitutional: Negative.   HENT:  Negative.    Eyes: Negative.   Respiratory: Negative.    Cardiovascular: Negative.   Gastrointestinal: Negative.   Endocrine: Negative.   Genitourinary: Negative.    Musculoskeletal:  Positive for arthralgias.  Skin: Negative.   Neurological: Negative.   Hematological: Negative.   Psychiatric/Behavioral: Negative.      Physical Exam: Vital signs show temperature of 98.3.  Pulse 72.  Blood pressure 125/66.  Weight is 184 pounds.   Wt Readings from Last 3 Encounters:  07/28/23 184 lb (83.5 kg)  07/03/23 184 lb 6.4 oz (83.6 kg)  06/21/23 182 lb (82.6 kg)    Physical Exam Vitals reviewed.  HENT:     Head: Normocephalic and atraumatic.  Eyes:     Pupils: Pupils are equal, round, and reactive to light.  Cardiovascular:     Rate and Rhythm: Normal rate and regular rhythm.     Heart sounds: Normal heart sounds.  Pulmonary:     Effort: Pulmonary effort is normal.     Breath  sounds: Normal breath sounds.  Abdominal:     General: Bowel sounds are normal.     Palpations: Abdomen is soft.  Musculoskeletal:        General: No tenderness or deformity. Normal range of motion.     Cervical back: Normal range of motion.     Comments: She has about 2+ edema in the lower legs.  I cannot palpate any venous cord.  She has a negative Homans' sign.  Lymphadenopathy:  Cervical: No cervical adenopathy.  Skin:    General: Skin is warm and dry.     Findings: No erythema or rash.  Neurological:     Mental Status: She is alert and oriented to person, place, and time.  Psychiatric:        Behavior: Behavior normal.        Thought Content: Thought content normal.        Judgment: Judgment normal.      Lab Results  Component Value Date   WBC 6.0 07/28/2023   HGB 12.0 07/28/2023   HCT 36.3 07/28/2023   MCV 96.8 07/28/2023   PLT 162 07/28/2023     Chemistry      Component Value Date/Time   NA 141 07/28/2023 1401   NA 142 07/17/2023 1358   NA 142 05/29/2017 1002   K 4.4 07/28/2023 1401   K 3.9 05/29/2017 1002   CL 104 07/28/2023 1401   CO2 27 07/28/2023 1401   CO2 28 05/29/2017 1002   BUN 28 (H) 07/28/2023 1401   BUN 26 07/17/2023 1358   BUN 13.5 05/29/2017 1002   CREATININE 1.05 (H) 07/28/2023 1401   CREATININE 0.81 04/06/2020 1059   CREATININE 0.8 05/29/2017 1002      Component Value Date/Time   CALCIUM 9.9 07/28/2023 1401   CALCIUM 9.4 05/29/2017 1002   ALKPHOS 49 07/28/2023 1401   ALKPHOS 39 (L) 05/29/2017 1002   AST 24 07/28/2023 1401   AST 18 05/29/2017 1002   ALT 29 07/28/2023 1401   ALT 19 05/29/2017 1002   BILITOT 0.3 07/28/2023 1401   BILITOT 0.48 05/29/2017 1002       Impression and Plan: [name] is a very charming 72 year old white female.  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  This was actually a small lesion.  Now, she is on [place]  I do believe that she is doing quite well.  Her last PET  scan I think was back in November.  We probably do not need to do another 1 until February or March.  I would like to have her come back in January, before they go on there big [place] cruise.  She will get her Xgeva then.  We will see if she needs any iron or Aranesp.     Josph Macho, MD 12/20/20242:52 PM

## 2023-07-31 LAB — IRON AND IRON BINDING CAPACITY (CC-WL,HP ONLY)
Iron: 60 ug/dL (ref 28–170)
Saturation Ratios: 20 % (ref 10.4–31.8)
TIBC: 298 ug/dL (ref 250–450)
UIBC: 238 ug/dL (ref 148–442)

## 2023-08-01 ENCOUNTER — Other Ambulatory Visit: Payer: Self-pay | Admitting: Hematology & Oncology

## 2023-08-01 ENCOUNTER — Other Ambulatory Visit: Payer: Self-pay

## 2023-08-01 DIAGNOSIS — C3490 Malignant neoplasm of unspecified part of unspecified bronchus or lung: Secondary | ICD-10-CM

## 2023-08-01 NOTE — Progress Notes (Signed)
Specialty Pharmacy Refill Coordination Note  Laura Bush is a 72 y.o. female contacted today regarding refills of specialty medication(s) Lorlatinib [name])   Patient requested Delivery   Delivery date: 08/08/23   Verified address: [address] [place] [address]   Medication will be filled on 12.30.24.  Refill request pending

## 2023-08-02 MED ORDER — LORLATINIB 100 MG PO TABS
100.0000 mg | ORAL_TABLET | Freq: Every day | ORAL | 0 refills | Status: DC
  Filled 2023-08-03: qty 30, 30d supply, fill #0

## 2023-08-03 ENCOUNTER — Other Ambulatory Visit (HOSPITAL_COMMUNITY): Payer: Self-pay

## 2023-08-03 ENCOUNTER — Other Ambulatory Visit: Payer: Self-pay

## 2023-08-04 ENCOUNTER — Encounter (INDEPENDENT_AMBULATORY_CARE_PROVIDER_SITE_OTHER): Payer: Medicare HMO | Admitting: Family Medicine

## 2023-08-04 DIAGNOSIS — R197 Diarrhea, unspecified: Secondary | ICD-10-CM

## 2023-08-04 NOTE — Telephone Encounter (Signed)

## 2023-08-04 NOTE — Telephone Encounter (Signed)
Please advise- we have no open appts today.

## 2023-08-07 ENCOUNTER — Other Ambulatory Visit: Payer: Self-pay

## 2023-08-10 DIAGNOSIS — D225 Melanocytic nevi of trunk: Secondary | ICD-10-CM | POA: Diagnosis not present

## 2023-08-10 DIAGNOSIS — L814 Other melanin hyperpigmentation: Secondary | ICD-10-CM | POA: Diagnosis not present

## 2023-08-10 DIAGNOSIS — Z85828 Personal history of other malignant neoplasm of skin: Secondary | ICD-10-CM | POA: Diagnosis not present

## 2023-08-10 DIAGNOSIS — Z08 Encounter for follow-up examination after completed treatment for malignant neoplasm: Secondary | ICD-10-CM | POA: Diagnosis not present

## 2023-08-10 DIAGNOSIS — L821 Other seborrheic keratosis: Secondary | ICD-10-CM | POA: Diagnosis not present

## 2023-08-15 DIAGNOSIS — Z7982 Long term (current) use of aspirin: Secondary | ICD-10-CM | POA: Diagnosis not present

## 2023-08-15 DIAGNOSIS — Z8249 Family history of ischemic heart disease and other diseases of the circulatory system: Secondary | ICD-10-CM | POA: Diagnosis not present

## 2023-08-15 DIAGNOSIS — C3490 Malignant neoplasm of unspecified part of unspecified bronchus or lung: Secondary | ICD-10-CM | POA: Diagnosis not present

## 2023-08-15 DIAGNOSIS — C7951 Secondary malignant neoplasm of bone: Secondary | ICD-10-CM | POA: Diagnosis not present

## 2023-08-15 DIAGNOSIS — G319 Degenerative disease of nervous system, unspecified: Secondary | ICD-10-CM | POA: Diagnosis not present

## 2023-08-15 DIAGNOSIS — R32 Unspecified urinary incontinence: Secondary | ICD-10-CM | POA: Diagnosis not present

## 2023-08-15 DIAGNOSIS — E119 Type 2 diabetes mellitus without complications: Secondary | ICD-10-CM | POA: Diagnosis not present

## 2023-08-15 DIAGNOSIS — E785 Hyperlipidemia, unspecified: Secondary | ICD-10-CM | POA: Diagnosis not present

## 2023-08-15 DIAGNOSIS — I10 Essential (primary) hypertension: Secondary | ICD-10-CM | POA: Diagnosis not present

## 2023-08-15 DIAGNOSIS — Z008 Encounter for other general examination: Secondary | ICD-10-CM | POA: Diagnosis not present

## 2023-08-15 DIAGNOSIS — M1990 Unspecified osteoarthritis, unspecified site: Secondary | ICD-10-CM | POA: Diagnosis not present

## 2023-08-15 LAB — HEMOGLOBIN A1C
EGFR: 60
Hemoglobin A1C: 6.1

## 2023-08-15 LAB — MICROALBUMIN / CREATININE URINE RATIO: Microalb Creat Ratio: <30

## 2023-08-16 ENCOUNTER — Other Ambulatory Visit: Payer: Self-pay | Admitting: Hematology & Oncology

## 2023-08-16 DIAGNOSIS — Z1231 Encounter for screening mammogram for malignant neoplasm of breast: Secondary | ICD-10-CM

## 2023-08-17 ENCOUNTER — Other Ambulatory Visit: Payer: Self-pay | Admitting: Family Medicine

## 2023-08-17 ENCOUNTER — Encounter: Payer: Self-pay | Admitting: Family Medicine

## 2023-08-17 DIAGNOSIS — K219 Gastro-esophageal reflux disease without esophagitis: Secondary | ICD-10-CM

## 2023-08-17 DIAGNOSIS — I10 Essential (primary) hypertension: Secondary | ICD-10-CM

## 2023-08-20 ENCOUNTER — Encounter (HOSPITAL_BASED_OUTPATIENT_CLINIC_OR_DEPARTMENT_OTHER): Payer: Self-pay | Admitting: Cardiovascular Disease

## 2023-08-20 DIAGNOSIS — I10 Essential (primary) hypertension: Secondary | ICD-10-CM

## 2023-08-21 MED ORDER — AMILORIDE HCL 5 MG PO TABS: 5.0000 mg | ORAL_TABLET | Freq: Every day | ORAL | 1 refills | Status: DC

## 2023-08-25 ENCOUNTER — Encounter: Payer: Self-pay | Admitting: Hematology & Oncology

## 2023-08-25 ENCOUNTER — Inpatient Hospital Stay: Payer: Medicare HMO | Attending: Hematology & Oncology

## 2023-08-25 ENCOUNTER — Inpatient Hospital Stay: Payer: Medicare HMO

## 2023-08-25 ENCOUNTER — Other Ambulatory Visit: Payer: Self-pay

## 2023-08-25 ENCOUNTER — Inpatient Hospital Stay (HOSPITAL_BASED_OUTPATIENT_CLINIC_OR_DEPARTMENT_OTHER): Payer: Medicare HMO | Admitting: Hematology & Oncology

## 2023-08-25 VITALS — BP 128/75 | HR 90 | Temp 99.1°F | Resp 16 | Ht <= 58 in | Wt 178.0 lb

## 2023-08-25 DIAGNOSIS — D509 Iron deficiency anemia, unspecified: Secondary | ICD-10-CM | POA: Diagnosis not present

## 2023-08-25 DIAGNOSIS — C3432 Malignant neoplasm of lower lobe, left bronchus or lung: Secondary | ICD-10-CM | POA: Diagnosis not present

## 2023-08-25 DIAGNOSIS — C3490 Malignant neoplasm of unspecified part of unspecified bronchus or lung: Secondary | ICD-10-CM

## 2023-08-25 DIAGNOSIS — N183 Chronic kidney disease, stage 3 unspecified: Secondary | ICD-10-CM

## 2023-08-25 DIAGNOSIS — C7951 Secondary malignant neoplasm of bone: Secondary | ICD-10-CM

## 2023-08-25 LAB — CMP (CANCER CENTER ONLY)
ALT: 20 U/L (ref 0–44)
AST: 17 U/L (ref 15–41)
Albumin: 4.3 g/dL (ref 3.5–5.0)
Alkaline Phosphatase: 46 U/L (ref 38–126)
Anion gap: 11 (ref 5–15)
BUN: 26 mg/dL — ABNORMAL HIGH (ref 8–23)
CO2: 28 mmol/L (ref 22–32)
Calcium: 11 mg/dL — ABNORMAL HIGH (ref 8.9–10.3)
Chloride: 98 mmol/L (ref 98–111)
Creatinine: 1.11 mg/dL — ABNORMAL HIGH (ref 0.44–1.00)
GFR, Estimated: 53 mL/min — ABNORMAL LOW (ref >60)
Glucose, Bld: 96 mg/dL (ref 70–99)
Potassium: 4.3 mmol/L (ref 3.5–5.1)
Sodium: 137 mmol/L (ref 135–145)
Total Bilirubin: 0.3 mg/dL (ref 0.0–1.2)
Total Protein: 8.2 g/dL — ABNORMAL HIGH (ref 6.5–8.1)

## 2023-08-25 LAB — CBC WITH DIFFERENTIAL (CANCER CENTER ONLY)
Abs Immature Granulocytes: 0.31 10*3/uL — ABNORMAL HIGH (ref 0.00–0.07)
Basophils Absolute: 0.1 10*3/uL (ref 0.0–0.1)
Basophils Relative: 1 %
Eosinophils Absolute: 0.6 10*3/uL — ABNORMAL HIGH (ref 0.0–0.5)
Eosinophils Relative: 7 %
HCT: 37.8 % (ref 36.0–46.0)
Hemoglobin: 12.6 g/dL (ref 12.0–15.0)
Immature Granulocytes: 3 %
Lymphocytes Relative: 11 %
Lymphs Abs: 1 10*3/uL (ref 0.7–4.0)
MCH: 31.4 pg (ref 26.0–34.0)
MCHC: 33.3 g/dL (ref 30.0–36.0)
MCV: 94.3 fL (ref 80.0–100.0)
Monocytes Absolute: 1.4 10*3/uL — ABNORMAL HIGH (ref 0.1–1.0)
Monocytes Relative: 16 %
Neutro Abs: 5.7 10*3/uL (ref 1.7–7.7)
Neutrophils Relative %: 62 %
Platelet Count: 216 10*3/uL (ref 150–400)
RBC: 4.01 MIL/uL (ref 3.87–5.11)
RDW: 16.1 % — ABNORMAL HIGH (ref 11.5–15.5)
WBC Count: 9.2 10*3/uL (ref 4.0–10.5)
nRBC: 0 % (ref 0.0–0.2)

## 2023-08-25 LAB — IRON AND IRON BINDING CAPACITY (CC-WL,HP ONLY)
Iron: 54 ug/dL (ref 28–170)
Saturation Ratios: 19 % (ref 10.4–31.8)
TIBC: 293 ug/dL (ref 250–450)
UIBC: 239 ug/dL (ref 148–442)

## 2023-08-25 LAB — RETICULOCYTES
Immature Retic Fract: 14 % (ref 2.3–15.9)
RBC.: 4.02 MIL/uL (ref 3.87–5.11)
Retic Count, Absolute: 75.2 10*3/uL (ref 19.0–186.0)
Retic Ct Pct: 1.9 % (ref 0.4–3.1)

## 2023-08-25 LAB — FERRITIN: Ferritin: 326 ng/mL — ABNORMAL HIGH (ref 11–307)

## 2023-08-25 MED ORDER — DENOSUMAB 120 MG/1.7ML ~~LOC~~ SOLN
120.0000 mg | Freq: Once | SUBCUTANEOUS | Status: AC
  Administered 2023-08-25: 120 mg via SUBCUTANEOUS
  Filled 2023-08-25: qty 1.7

## 2023-08-25 NOTE — Patient Instructions (Signed)
Denosumab Injection (Oncology) What is this medication? DENOSUMAB (den oh SUE mab) prevents weakened bones caused by cancer. It may also be used to treat noncancerous bone tumors that cannot be removed by surgery. It can also be used to treat high calcium levels in the blood caused by cancer. It works by blocking a protein that causes bones to break down quickly. This slows down the release of calcium from bones, which lowers calcium levels in your blood. It also makes your bones stronger and less likely to break (fracture). This medicine may be used for other purposes; ask your health care provider or pharmacist if you have questions. COMMON BRAND NAME(S): XGEVA What should I tell my care team before I take this medication? They need to know if you have any of these conditions: Dental disease Having surgery or tooth extraction Infection Kidney disease Low levels of calcium or vitamin D in the blood Malnutrition On hemodialysis Skin conditions or sensitivity Thyroid or parathyroid disease An unusual reaction to denosumab, other medications, foods, dyes, or preservatives Pregnant or trying to get pregnant Breast-feeding How should I use this medication? This medication is for injection under the skin. It is given by your care team in a hospital or clinic setting. A special MedGuide will be given to you before each treatment. Be sure to read this information carefully each time. Talk to your care team about the use of this medication in children. While it may be prescribed for children as young as 13 years for selected conditions, precautions do apply. Overdosage: If you think you have taken too much of this medicine contact a poison control center or emergency room at once. NOTE: This medicine is only for you. Do not share this medicine with others. What if I miss a dose? Keep appointments for follow-up doses. It is important not to miss your dose. Call your care team if you are unable to  keep an appointment. What may interact with this medication? Do not take this medication with any of the following: Other medications containing denosumab This medication may also interact with the following: Medications that lower your chance of fighting infection Steroid medications, such as prednisone or cortisone This list may not describe all possible interactions. Give your health care provider a list of all the medicines, herbs, non-prescription drugs, or dietary supplements you use. Also tell them if you smoke, drink alcohol, or use illegal drugs. Some items may interact with your medicine. What should I watch for while using this medication? Your condition will be monitored carefully while you are receiving this medication. You may need blood work while taking this medication. This medication may increase your risk of getting an infection. Call your care team for advice if you get a fever, chills, sore throat, or other symptoms of a cold or flu. Do not treat yourself. Try to avoid being around people who are sick. You should make sure you get enough calcium and vitamin D while you are taking this medication, unless your care team tells you not to. Discuss the foods you eat and the vitamins you take with your care team. Some people who take this medication have severe bone, joint, or muscle pain. This medication may also increase your risk for jaw problems or a broken thigh bone. Tell your care team right away if you have severe pain in your jaw, bones, joints, or muscles. Tell your care team if you have any pain that does not go away or that gets worse. Talk   to your care team if you may be pregnant. Serious birth defects can occur if you take this medication during pregnancy and for 5 months after the last dose. You will need a negative pregnancy test before starting this medication. Contraception is recommended while taking this medication and for 5 months after the last dose. Your care team  can help you find the option that works for you. What side effects may I notice from receiving this medication? Side effects that you should report to your care team as soon as possible: Allergic reactions--skin rash, itching, hives, swelling of the face, lips, tongue, or throat Bone, joint, or muscle pain Low calcium level--muscle pain or cramps, confusion, tingling, or numbness in the hands or feet Osteonecrosis of the jaw--pain, swelling, or redness in the mouth, numbness of the jaw, poor healing after dental work, unusual discharge from the mouth, visible bones in the mouth Side effects that usually do not require medical attention (report to your care team if they continue or are bothersome): Cough Diarrhea Fatigue Headache Nausea This list may not describe all possible side effects. Call your doctor for medical advice about side effects. You may report side effects to FDA at 1-800-FDA-1088. Where should I keep my medication? This medication is given in a hospital or clinic. It will not be stored at home. NOTE: This sheet is a summary. It may not cover all possible information. If you have questions about this medicine, talk to your doctor, pharmacist, or health care provider.  2024 Elsevier/Gold Standard (2021-12-15 00:00:00)  

## 2023-08-25 NOTE — Progress Notes (Signed)
Hematology and Oncology Follow Up Visit  Laura Bush [id number] [birth year] 73 y.o. 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 3 months  -next dose in 11/2023   S/P SBRT to LUL -- completed on 03/04/2022 S/P XRT to RIGHT femur -- completed on 03/04/2022 Alecensa 600 mg po BID -- start on 03/24/2022 --on hold starting 04/04/2023 -restart 300 mg p.o. twice daily on 05/04/2023 --DC on 05/26/2023 Lorbrena 100 mg po q day  -- start on 06/14/2023 Aranesp 300 mcg subcu every 3 weeks Venofer-given on 07/10/2023     Interim History:  [name] is back for follow-up.  Overall, she is doing great.  She is doing well with the [place]  She has had no side effects from this.  She really has done quite well with the Aranesp.  Her hemoglobin has come up quite nicely.  Her renal function is also improved.  She has not complained of any pain.  She has had no problems with nausea or vomiting.  She has had no cough.  She has had no headache.  She and her family are getting ready to go on their big [employer].  I know they will really enjoyed it.  I told her make sure she wears sunscreen.  Her last iron studies that were done back in December showed a ferritin of 423 with an iron saturation of 20%.  She had IV iron back in early December.  I think this is really helped.  Overall, I would have to say that her performance status is probably ECOG 1.  .    Medications:  Current Outpatient Medications:    pravastatin (PRAVACHOL) 20 MG tablet, Take 20 mg by mouth daily., Disp: , Rfl:    acetaminophen (TYLENOL) 500 MG tablet, Take 1 tablet (500 mg total) by mouth every 8 (eight) hours as needed for mild pain or moderate pain., Disp: 30 tablet, Rfl: 0    aMILoride (MIDAMOR) 5 MG tablet, Take 1 tablet (5 mg total) by mouth daily., Disp: 90 tablet, Rfl: 1   aspirin EC 81 MG tablet, Take 81 mg by mouth in the morning. Swallow whole., Disp: , Rfl:    B Complex-C (SUPER B COMPLEX PO), Take 1 tablet by mouth daily with breakfast., Disp: , Rfl:    Calcium Carb-Cholecalciferol (CALCIUM + D3 PO), Take 1 tablet by mouth in the morning., Disp: , Rfl:    cholecalciferol (VITAMIN D3) 25 MCG (1000 UNIT) tablet, Take 1,000 Units by mouth in the morning., Disp: , Rfl:    COLLAGEN PO, Take 3 tablets by mouth in the morning and at bedtime. With Biotin, Disp: , Rfl:    Denosumab (XGEVA [place]), Inject into the skin every 3 (three) months., Disp: , Rfl:    Evolocumab (REPATHA SURECLICK) 140 MG/ML SOAJ, Inject 140 mg into the skin every 14 (fourteen) days., Disp: 6 mL, Rfl: 3   fexofenadine (ALLEGRA) 180 MG tablet, Take 180 mg by mouth in the morning., Disp: , Rfl:    folic acid (FOLATE) 400 MCG tablet, Take 400 mcg by mouth in the morning., Disp: , Rfl:    furosemide (LASIX) 20 MG tablet, Take Lasix two tablets (20 mg each) once a day, Disp: 60 tablet, Rfl: 4   ibuprofen (ADVIL) 200 MG  tablet, Take 600 mg by mouth daily as needed., Disp: , Rfl:    ipratropium (ATROVENT) 0.06 % nasal spray, Place 2 sprays into both nostrils 4 (four) times daily as needed for rhinitis., Disp: 15 mL, Rfl: 4   lorlatinib (LORBRENA) 100 MG tablet, Take 1 tablet (100 mg total) by mouth daily. Swallow tablets whole. Do not chew, crush or split tablets., Disp: 30 tablet, Rfl: 0   metFORMIN (GLUCOPHAGE) 500 MG tablet, TAKE TWO TABELTS AT LUNCH AND ONE TABLET AT DINNER (Patient taking differently: Take 1,000 mg by mouth daily at 12 noon.), Disp: 270 tablet, Rfl: 0   NIFEdipine (PROCARDIA-XL/NIFEDICAL-XL) 30 MG 24 hr tablet, Take 1 tablet (30 mg total) by mouth daily., Disp: 90 tablet, Rfl: 1   pantoprazole (PROTONIX) 40 MG tablet, Take 1 tablet (40 mg total) by mouth daily., Disp: 90 tablet, Rfl: 0    polyethylene glycol (MIRALAX / GLYCOLAX) 17 g packet, Take 17 g by mouth daily as needed (constipation)., Disp: , Rfl:    potassium chloride SA (KLOR-CON M) 20 MEQ tablet, Take 1 tablet (20 mEq total) by mouth 2 (two) times daily., Disp: , Rfl:    spironolactone (ALDACTONE) 25 MG tablet, Take 1 tablet (25 mg total) by mouth daily., Disp: 90 tablet, Rfl: 3   traZODone (DESYREL) 50 MG tablet, TAKE 1 TABLET BY MOUTH EVERY NIGHT AT BEDTIME AS NEEDED FOR SLEEP (Patient taking differently: Take 50 mg by mouth at bedtime. for sleep), Disp: 90 tablet, Rfl: 2 No current facility-administered medications for this visit.  Facility-Administered Medications Ordered in Other Visits:    denosumab (XGEVA) injection 120 mg, 120 mg, Subcutaneous, Once, Clementina Mareno, Rose Phi, MD  Allergies:  Allergies  Allergen Reactions   Diclofenac Hypertension   Amlodipine Swelling and Other (See Comments)    Limbs swell, not the throat   Lanolin Other (See Comments)    Sneezing and watery eyes. Allergic to [facility].   Tape Other (See Comments)    Redness (Bandaids also)    Past Medical History, Surgical history, Social history, and Family History were reviewed and updated.  Review of Systems: Review of Systems  Constitutional: Negative.   HENT:  Negative.    Eyes: Negative.   Respiratory: Negative.    Cardiovascular: Negative.   Gastrointestinal: Negative.   Endocrine: Negative.   Genitourinary: Negative.    Musculoskeletal:  Positive for arthralgias.  Skin: Negative.   Neurological: Negative.   Hematological: Negative.   Psychiatric/Behavioral: Negative.      Physical Exam: Vital signs show temperature of 99.1.  Pulse 90.  Blood pressure 128/75.  Weight is 178 pounds.    Wt Readings from Last 3 Encounters:  08/25/23 178 lb (80.7 kg)  07/28/23 184 lb (83.5 kg)  07/03/23 184 lb 6.4 oz (83.6 kg)    Physical Exam Vitals reviewed.  HENT:     Head: Normocephalic and atraumatic.  Eyes:     Pupils: Pupils are  equal, round, and reactive to light.  Cardiovascular:     Rate and Rhythm: Normal rate and regular rhythm.     Heart sounds: Normal heart sounds.  Pulmonary:     Effort: Pulmonary effort is normal.     Breath sounds: Normal breath sounds.  Abdominal:     General: Bowel sounds are normal.     Palpations: Abdomen is soft.  Musculoskeletal:        General: No tenderness or deformity. Normal range of motion.     Cervical back: Normal range of motion.  Comments: She has about 2+ edema in the lower legs.  I cannot palpate any venous cord.  She has a negative Homans' sign.  Lymphadenopathy:     Cervical: No cervical adenopathy.  Skin:    General: Skin is warm and dry.     Findings: No erythema or rash.  Neurological:     Mental Status: She is alert and oriented to person, place, and time.  Psychiatric:        Behavior: Behavior normal.        Thought Content: Thought content normal.        Judgment: Judgment normal.     Lab Results  Component Value Date   WBC 9.2 08/25/2023   HGB 12.6 08/25/2023   HCT 37.8 08/25/2023   MCV 94.3 08/25/2023   PLT 216 08/25/2023     Chemistry      Component Value Date/Time   NA 137 08/25/2023 0934   NA 142 07/17/2023 1358   NA 142 05/29/2017 1002   K 4.3 08/25/2023 0934   K 3.9 05/29/2017 1002   CL 98 08/25/2023 0934   CO2 28 08/25/2023 0934   CO2 28 05/29/2017 1002   BUN 26 (H) 08/25/2023 0934   BUN 26 07/17/2023 1358   BUN 13.5 05/29/2017 1002   CREATININE 1.11 (H) 08/25/2023 0934   CREATININE 0.81 04/06/2020 1059   CREATININE 0.8 05/29/2017 1002      Component Value Date/Time   CALCIUM 11.0 (H) 08/25/2023 0934   CALCIUM 9.4 05/29/2017 1002   ALKPHOS 46 08/25/2023 0934   ALKPHOS 39 (L) 05/29/2017 1002   AST 17 08/25/2023 0934   AST 18 05/29/2017 1002   ALT 20 08/25/2023 0934   ALT 19 05/29/2017 1002   BILITOT 0.3 08/25/2023 0934   BILITOT 0.48 05/29/2017 1002       Impression and Plan: [name] is a very charming  73 year old white female.  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  This was actually a small lesion.  Now, she is on [place]  I do believe that she is doing quite well.  Her last PET scan I think was back in November.  We will go ahead and get her set up with another scan in February.  She will get her [place] Barbara today.  She does not need any Aranesp which is wonderful.  I am glad that her performance status is somewhat better so she will be able to enjoy her cruise.   Josph Macho, MD 1/17/202510:35 AM

## 2023-08-29 ENCOUNTER — Other Ambulatory Visit (HOSPITAL_COMMUNITY): Payer: Self-pay

## 2023-09-01 ENCOUNTER — Other Ambulatory Visit (HOSPITAL_COMMUNITY): Payer: Self-pay

## 2023-09-01 ENCOUNTER — Other Ambulatory Visit: Payer: Self-pay

## 2023-09-01 ENCOUNTER — Encounter: Payer: Self-pay | Admitting: Family Medicine

## 2023-09-01 ENCOUNTER — Other Ambulatory Visit: Payer: Self-pay | Admitting: Family Medicine

## 2023-09-01 ENCOUNTER — Other Ambulatory Visit: Payer: Self-pay | Admitting: Hematology & Oncology

## 2023-09-01 DIAGNOSIS — C7951 Secondary malignant neoplasm of bone: Secondary | ICD-10-CM

## 2023-09-01 MED ORDER — LORLATINIB 100 MG PO TABS
100.0000 mg | ORAL_TABLET | Freq: Every day | ORAL | 0 refills | Status: DC
  Filled 2023-09-01 – 2023-09-07 (×2): qty 30, 30d supply, fill #0

## 2023-09-01 NOTE — Progress Notes (Signed)
Specialty Pharmacy Ongoing Clinical Assessment Note  Laura Bush is a 73 y.o. female who is being followed by the specialty pharmacy service for RxSp Oncology   Patient's specialty medication(s) reviewed today: Lorlatinib (LORBRENA)   Missed doses in the last 4 weeks: 0   Patient/Caregiver did not have any additional questions or concerns.   Therapeutic benefit summary: Unable to assess   Adverse events/side effects summary: No adverse events/side effects   Patient's therapy is appropriate to: Continue    Goals Addressed             This Visit's Progress    Stabilization of disease       Patient is on track. Patient will maintain adherence         Follow up:  3 months  Bobette Mo Specialty Pharmacist

## 2023-09-01 NOTE — Progress Notes (Signed)
Specialty Pharmacy Refill Coordination Note  Laura Bush is a 73 y.o. female contacted today regarding refills of specialty medication(s) Lorlatinib [name])   Patient requested [name] at [facility] [place] date: 09/06/23   Medication will be filled on 09/05/23. Pending Refill Request.

## 2023-09-05 ENCOUNTER — Other Ambulatory Visit: Payer: Self-pay

## 2023-09-05 ENCOUNTER — Other Ambulatory Visit (HOSPITAL_COMMUNITY): Payer: Self-pay

## 2023-09-05 NOTE — Progress Notes (Signed)
09/05/23 CA: Towanda Malkin is attempting to request additional funds from the foundation.

## 2023-09-06 ENCOUNTER — Other Ambulatory Visit: Payer: Self-pay

## 2023-09-07 ENCOUNTER — Other Ambulatory Visit (HOSPITAL_COMMUNITY): Payer: Self-pay

## 2023-09-07 ENCOUNTER — Other Ambulatory Visit: Payer: Self-pay

## 2023-09-08 ENCOUNTER — Ambulatory Visit (INDEPENDENT_AMBULATORY_CARE_PROVIDER_SITE_OTHER): Payer: Medicare HMO | Admitting: Family Medicine

## 2023-09-08 ENCOUNTER — Ambulatory Visit: Payer: Self-pay | Admitting: Family Medicine

## 2023-09-08 VITALS — BP 132/80 | HR 93 | Temp 98.0°F | Resp 16 | Ht <= 58 in | Wt 185.2 lb

## 2023-09-08 DIAGNOSIS — J0100 Acute maxillary sinusitis, unspecified: Secondary | ICD-10-CM | POA: Diagnosis not present

## 2023-09-08 DIAGNOSIS — J4521 Mild intermittent asthma with (acute) exacerbation: Secondary | ICD-10-CM | POA: Diagnosis not present

## 2023-09-08 LAB — POCT INFLUENZA A/B

## 2023-09-08 LAB — POC COVID19 BINAXNOW: SARS Coronavirus 2 Ag: NEGATIVE

## 2023-09-08 MED ORDER — PREDNISONE 50 MG PO TABS: ORAL_TABLET | ORAL | 0 refills | Status: DC

## 2023-09-08 MED ORDER — ALBUTEROL-BUDESONIDE 90-80 MCG/ACT IN AERO: 2.0000 | INHALATION_SPRAY | Freq: Four times a day (QID) | RESPIRATORY_TRACT | 1 refills | Status: DC | PRN

## 2023-09-08 MED ORDER — AMOXICILLIN-POT CLAVULANATE 875-125 MG PO TABS: 1.0000 | ORAL_TABLET | Freq: Two times a day (BID) | ORAL | 0 refills | Status: AC

## 2023-09-08 NOTE — Telephone Encounter (Signed)
Copied from CRM [id number]. Topic: Appointments - Appointment Scheduling >> Sep 08, 2023  8:01 AM [place] A wrote: Patient has been coughing, nasal congestion-Patient has Lung Cancer and is worried about the deep cough she has   Chief Complaint: Productive cough x 1 week Symptoms: nasal/chest congestion, wheezing, and headache Frequency: constant Pertinent Negatives: Patient denies fever Disposition: [] ED /[] Urgent Care (no appt availability in office) / [x] Appointment(In office/virtual)/ []  [facility]/ [] Home Care/ [] Refused Recommended Disposition /[] [facility]/ []  Follow-up with PCP Additional Notes: Patient reports history of NSC lung cancer, states that she is currently taking a targeted chemo pill. Pts reports recent cruise to the [place] earlier this month. Appt scheduled for today at 0945  Reason for Disposition  [1] Known COPD or other severe lung disease (i.e., bronchiectasis, cystic fibrosis, lung surgery) AND [2] worsening symptoms (i.e., increased sputum purulence or amount, increased breathing difficulty  Answer Assessment - Initial Assessment Questions 1. ONSET: "When did the cough begin?"      1 week  2. SEVERITY: "How bad is the cough today?"      Feels like a deep cough"  3. SPUTUM: "Describe the color of your sputum" (none, dry cough; clear, white, yellow, green)     Yellowish  4. HEMOPTYSIS: "Are you coughing up any blood?" If so ask: "How much?" (flecks, streaks, tablespoons, etc.)     No blood  5. DIFFICULTY BREATHING: "Are you having difficulty breathing?" If Yes, ask: "How bad is it?" (e.g., mild, moderate, severe)    - MILD: No SOB at rest, mild SOB with walking, speaks normally in sentences, can lie down, no retractions, pulse < 100.    - MODERATE: SOB at rest, SOB with minimal exertion and prefers to sit, cannot lie down flat, speaks in phrases, mild retractions, audible wheezing, pulse 100-120.    - SEVERE: Very SOB at rest, speaks in  single words, struggling to breathe, sitting hunched forward, retractions, pulse > 120      Shortness of breath only when coughing  6. FEVER: "Do you have a fever?" If Yes, ask: "What is your temperature, how was it measured, and when did it start?"     Low grade  7. CARDIAC HISTORY: "Do you have any history of heart disease?" (e.g., heart attack, congestive heart failure)      High blood pressure  8. LUNG HISTORY: "Do you have any history of lung disease?"  (e.g., pulmonary embolus, asthma, emphysema)     History of lung cancer, takes a daily targeted pill.   9. PE RISK FACTORS: "Do you have a history of blood clots?" (or: recent major surgery, recent prolonged travel, bedridden)     No  10. OTHER SYMPTOMS: "Do you have any other symptoms?" (e.g., runny nose, wheezing, chest pain)       Nasal congestion, headaches, wheezing last night  11. PREGNANCY: "Is there any chance you are pregnant?" "When was your last menstrual period?"       No  12. TRAVEL: "Have you traveled out of the country in the last month?" (e.g., travel history, exposures)       Went on cruise to [place] earlier this month  Protocols used: Cough - Acute Productive-A-AH

## 2023-09-08 NOTE — Telephone Encounter (Signed)
 Appt today.  FYI.

## 2023-09-08 NOTE — Patient Instructions (Addendum)
Continue to push fluids, practice good hand hygiene, and cover your mouth if you cough.  If you start having fevers, shaking or shortness of breath, seek immediate care.  OK to take Tylenol 1000 mg (2 extra strength tabs) or 975 mg (3 regular strength tabs) every 6 hours as needed.  Wait 2 days, if the sinuses are not significantly improved by Sunday, take the antibiotic.   Let us know if you need anything.

## 2023-09-08 NOTE — Addendum Note (Signed)
Addended by: Kathi Ludwig on: 09/08/2023 10:59 AM   Modules accepted: Orders

## 2023-09-08 NOTE — Progress Notes (Signed)
Chief Complaint  Patient presents with   Cough    Cough     Laura Bush [place] here for URI complaints.  Duration: 9 days Associated symptoms: sinus headache, sinus congestion, sinus pain, rhinorrhea, wheezing, and dental pain, productive cough Denies: red eyes, ear pain, ear drainage, sore throat, shortness of breath, myalgia, and fevers Treatment to date: Mucinex, Delsym Sick contacts: No  Past Medical History:  Diagnosis Date   Allergy    seasonal   Anemia associated with stage 3 chronic renal failure (HCC) 04/18/2023   Arthritis Last several years   Mostly in hands, wrists, shoulders, knees   Asthma    in cold weather   Breast cancer (HCC) 09/18/2014   left breast   Breast cancer (HCC) 10/07/2014   bx left breast   Breast cancer of upper-outer quadrant of left female breast (HCC) 09/22/2014   Cluster headaches    in her 40's   GERD (gastroesophageal reflux disease)    Goals of care, counseling/discussion 01/14/2022   Heart murmur    as a child (has outgrown)   High cholesterol    History of radiation therapy    Left lung, Right Femur- 02/16/22-03/04/22- Dr. 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"   Stroke [facility]) summer 2022   described as tiny   Swallowing difficulty    Venous insufficiency     Objective BP 132/80   Pulse 93   Temp 98 F (36.7 C) (Oral)   Resp 16   Ht 4\' 10"  (1.473 m)   Wt 185 lb 3.2 oz (84 kg)   SpO2 98%   BMI 38.71 kg/m  General: Awake, alert, appears stated  age HEENT: AT, [place], ears patent b/l and TM's neg, nares patent w/o discharge, pharynx pink and without exudates, MMM, +ttp over b/l max sinuses Neck: No masses or asymmetry Heart: RRR Lungs: +diffuse exp wheezes, no accessory muscle use Psych: Age appropriate judgment and insight, normal mood and affect  Acute maxillary sinusitis, recurrence not specified - Plan: amoxicillin-clavulanate (AUGMENTIN) 875-125 MG tablet  Mild intermittent asthma with exacerbation - Plan: Albuterol-Budesonide 90-80 MCG/ACT AERO, predniSONE (DELTASONE) 50 MG tablet  Continue to push fluids, practice good hand hygiene, cover mouth when coughing. F/u prn. If starting to experience fevers, shaking, or worsening shortness of breath, seek immediate care. Pt voiced understanding and agreement to the plan.  Jilda Roche [place], DO 09/08/23 9:52 AM

## 2023-09-12 ENCOUNTER — Encounter (HOSPITAL_BASED_OUTPATIENT_CLINIC_OR_DEPARTMENT_OTHER): Payer: Self-pay | Admitting: Pharmacist Clinician (PhC)/ Clinical Pharmacy Specialist

## 2023-09-12 ENCOUNTER — Ambulatory Visit (HOSPITAL_BASED_OUTPATIENT_CLINIC_OR_DEPARTMENT_OTHER): Payer: Medicare HMO | Admitting: Pharmacist Clinician (PhC)/ Clinical Pharmacy Specialist

## 2023-09-12 VITALS — BP 134/70 | HR 64

## 2023-09-12 DIAGNOSIS — I10 Essential (primary) hypertension: Secondary | ICD-10-CM | POA: Diagnosis not present

## 2023-09-12 NOTE — Progress Notes (Signed)
 Office Visit    Patient Name: Laura Bush Date of Encounter: 09/12/2023  Primary Care Provider:  Watt Harlene BROCKS, MD Primary Cardiologist:  Annabella Scarce, MD  Chief Complaint    Hypertension  Significant Past Medical History   HLD Now on evolocumab   LVH Leg swelling noted, on furosemide  40 mg and spironolactone  25 mg  Lung cancer Previous chemo was affecting renal function, doing better with current regimen.    Raynaud's Did well on nifedipine , already had episodes since d/c  CAD CAC = 242 (83rd percentile), all in LAD    Allergies  Allergen Reactions   Diclofenac  Hypertension   Amlodipine  Swelling and Other (See Comments)    Limbs swell, not the throat   Lanolin Other (See Comments)    Sneezing and watery eyes. Allergic to Wool.   Tape Other (See Comments)    Redness (Bandaids also)    History of Present Illness    Laura Bush is a 73 y.o. female patient of Dr Scarce, in the office today for hypertension evaluation. I saw her last month for cholesterol management, now she is taking Repatha  without any problems.  She currently  takes nifedipine , which in addition to helping with her BP, also helps with Raynaud's symptoms.   When it was discontinued she noted more flares, so we restarted it last month.    Today she notes that she is just recovering from a sinus infection, has just taken her last dose of prednisone  50 mg (5 days total) and has a few more days on Augmentin .  She was also given an inhaler, but notes is using it less frequently now.  Has seen some increase in her BP readings, but still not much over 130 systolic.     Blood Pressure Goal:  130/80  Current Medications:  nifedipine  xl 30 mg every day, sprionolactone 25 mg every day   Previously tried:  amlodipine  - edema  Family Hx:   both parents had high BP; father died from stroke, mom still living, with dementia at 35, sister without hypertension; 2 children healthy  Social Hx:       Tobacco: no  Alcohol: occasional wine  Caffeine: 2 coffee in the morning, decaf teas,   Diet:    mostly home cooked from scratch, vegetables fresh and frozen; lots of citrus; 2 cookies at lunch  Exercise:  none recently  Home BP readings:  home arm cuff, about 73 years old, [employer]  Accessory Clinical Findings    Lab Results  Component Value Date   CREATININE 1.11 (H) 08/25/2023   BUN 26 (H) 08/25/2023   NA 137 08/25/2023   K 4.3 08/25/2023   CL 98 08/25/2023   CO2 28 08/25/2023   Lab Results  Component Value Date   ALT 20 08/25/2023   AST 17 08/25/2023   ALKPHOS 46 08/25/2023   BILITOT 0.3 08/25/2023   Lab Results  Component Value Date   HGBA1C 5.5 10/26/2022    Home Medications    Current Outpatient Medications  Medication Sig Dispense Refill   acetaminophen  (TYLENOL ) 500 MG tablet Take 1 tablet (500 mg total) by mouth every 8 (eight) hours as needed for mild pain or moderate pain. 30 tablet 0   Albuterol -Budesonide  90-80 MCG/ACT AERO Inhale 2 puffs into the lungs every 6 (six) hours as needed (wheezing, sob). 10.7 g 1   amoxicillin -clavulanate (AUGMENTIN ) 875-125 MG tablet Take 1 tablet by mouth 2 (two) times daily for 7 days. 14 tablet 0  aspirin EC 81 MG tablet Take 81 mg by mouth in the morning. Swallow whole.     B Complex-C (SUPER B COMPLEX PO) Take 1 tablet by mouth daily with breakfast.     Calcium  Carb-Cholecalciferol (CALCIUM  + D3 PO) Take 1 tablet by mouth in the morning.     cholecalciferol (VITAMIN D3) 25 MCG (1000 UNIT) tablet Take 1,000 Units by mouth in the morning.     COLLAGEN PO Take 3 tablets by mouth in the morning and at bedtime. With Biotin     Denosumab  (XGEVA  [place]) Inject into the skin every 3 (three) months.     Evolocumab  (REPATHA  SURECLICK) 140 MG/ML SOAJ Inject 140 mg into the skin every 14 (fourteen) days. 6 mL 3   folic acid  (FOLATE) 400 MCG tablet Take 400 mcg by mouth in the morning.     furosemide  (LASIX ) 20 MG tablet Take Lasix  two  tablets (20 mg each) once a day 60 tablet 4   ibuprofen (ADVIL) 200 MG tablet Take 600 mg by mouth daily as needed.     ipratropium (ATROVENT ) 0.06 % nasal spray Place 2 sprays into both nostrils 4 (four) times daily as needed for rhinitis. 15 mL 4   lorlatinib  (LORBRENA ) 100 MG tablet Take 1 tablet (100 mg total) by mouth daily. Swallow tablets whole. Do not chew, crush or split tablets. 30 tablet 0   metFORMIN  (GLUCOPHAGE ) 500 MG tablet TAKE TWO TABELTS AT LUNCH AND ONE TABLET AT DINNER (Patient taking differently: Take 500 mg by mouth 2 (two) times daily with a meal.) 270 tablet 0   NIFEdipine  (PROCARDIA -XL/NIFEDICAL-XL) 30 MG 24 hr tablet Take 1 tablet (30 mg total) by mouth daily. 90 tablet 1   pantoprazole  (PROTONIX ) 40 MG tablet Take 1 tablet (40 mg total) by mouth daily. 90 tablet 0   spironolactone  (ALDACTONE ) 25 MG tablet Take 1 tablet (25 mg total) by mouth daily. 90 tablet 3   traZODone  (DESYREL ) 50 MG tablet TAKE 1 TABLET BY MOUTH EVERY NIGHT AT BEDTIME AS NEEDED FOR SLEEP (Patient taking differently: Take 50 mg by mouth at bedtime. for sleep) 90 tablet 2   fexofenadine (ALLEGRA) 180 MG tablet Take 180 mg by mouth in the morning.     No current facility-administered medications for this visit.       Assessment & Plan    Primary hypertension Assessment: BP is controlled in office BP 134/70 mmHg;  Tolerates nifedipine  xl 30 and spironolactone  25 well, without any side effects Denies SOB, palpitation, chest pain, headaches,or swelling Reiterated the importance of regular exercise and low salt diet   Plan:  Continue taking nifedipine  xl 30 mg every day, sprionolactone 25 mg every day  Patient to keep record of BP readings with heart rate and report to us  at the next visit Patient to follow up with Dr. Raford in May  Labs ordered today: none    Allean Mink PharmD CPP [facility]  [address] [place], [place] [address] [phone]

## 2023-09-12 NOTE — Assessment & Plan Note (Signed)
 Assessment: BP is controlled in office BP 134/70 mmHg;  Tolerates nifedipine  xl 30 and spironolactone  25 well, without any side effects Denies SOB, palpitation, chest pain, headaches,or swelling Reiterated the importance of regular exercise and low salt diet   Plan:  Continue taking nifedipine  xl 30 mg every day, sprionolactone 25 mg every day  Patient to keep record of BP readings with heart rate and report to us  at the next visit Patient to follow up with Dr. Raford in May  Labs ordered today: none

## 2023-09-12 NOTE — Patient Instructions (Signed)
 Follow up appointment: with Dr. Raford in May  Take your BP meds as follows: continue with nifedipine  and spironolactone   Check your blood pressure at home daily (if able) and keep record of the readings.  Your blood pressure goal is < 130/80  To check your pressure at home you will need to:  1. Sit up in a chair, with feet flat on the floor and back supported. Do not cross your ankles or legs. 2. Rest your left arm so that the cuff is about heart level. If the cuff goes on your upper arm,  then just relax the arm on the table, arm of the chair or your lap. If you have a wrist cuff, we  suggest relaxing your wrist against your chest (think of it as Pledging the Flag with the  wrong arm).  3. Place the cuff snugly around your arm, about 1 inch above the crook of your elbow. The  cords should be inside the groove of your elbow.  4. Sit quietly, with the cuff in place, for about 5 minutes. After that 5 minutes press the power  button to start a reading. 5. Do not talk or move while the reading is taking place.  6. Record your readings on a sheet of paper. Although most cuffs have a memory, it is often  easier to see a pattern developing when the numbers are all in front of you.  7. You can repeat the reading after 1-3 minutes if it is recommended  Make sure your bladder is empty and you have not had caffeine or tobacco within the last 30 min  Always bring your blood pressure log with you to your appointments. If you have not brought your monitor in to be double checked for accuracy, please bring it to your next appointment.  You can find a list of quality blood pressure cuffs at [email or web address]  Important lifestyle changes to control high blood pressure  Intervention  Effect on the BP  Lose extra pounds and watch your waistline Weight loss is one of the most effective lifestyle changes for controlling blood pressure. If you're overweight or obese, losing even a small amount of weight  can help reduce blood pressure. Blood pressure might go down by about 1 millimeter of mercury (mm Hg) with each kilogram (about 2.2 pounds) of weight lost.  Exercise regularly As a general goal, aim for at least 30 minutes of moderate physical activity every day. Regular physical activity can lower high blood pressure by about 5 to 8 mm Hg.  Eat a healthy diet Eating a diet rich in whole grains, fruits, vegetables, and low-fat dairy products and low in saturated fat and cholesterol. A healthy diet can lower high blood pressure by up to 11 mm Hg.  Reduce salt (sodium) in your diet Even a small reduction of sodium in the diet can improve heart health and reduce high blood pressure by about 5 to 6 mm Hg.  Limit alcohol One drink equals 12 ounces of beer, 5 ounces of wine, or 1.5 ounces of 80-proof liquor.  Limiting alcohol to less than one drink a day for women or two drinks a day for men can help lower blood pressure by about 4 mm Hg.   If you have any questions or concerns please use My Chart to send questions or call the office at [phone]

## 2023-09-13 ENCOUNTER — Ambulatory Visit (INDEPENDENT_AMBULATORY_CARE_PROVIDER_SITE_OTHER): Payer: Medicare HMO | Admitting: Neurology

## 2023-09-13 ENCOUNTER — Encounter: Payer: Self-pay | Admitting: Neurology

## 2023-09-13 VITALS — BP 130/86 | HR 69 | Ht 59.0 in | Wt 191.8 lb

## 2023-09-13 DIAGNOSIS — G4733 Obstructive sleep apnea (adult) (pediatric): Secondary | ICD-10-CM

## 2023-09-13 NOTE — Progress Notes (Signed)
 Subjective:    Patient ID: Laura Bush is a 73 y.o. female.  HPI    Interim history:   [name] is a 73 year old female with an underlying medical history of non-small cell lung cancer, right femur fracture with rod placement, hypertension, hyperlipidemia, heart murmur, history of cluster headaches, reflux disease, breast cancer, status post radiation therapy and surgery, on tamoxifen , asthma, history of rosacea, history of venous insufficiency and pneumonia, hemochromatosis, and obesity, who presents for follow-up consultation of her obstructive sleep apnea, treated with AutoPap therapy.  The patient is unaccompanied today and presents after a longer gap of over 2 years, as she has been in cancer care and treatment.    Today, 09/13/2023: She reports overall being compliant with her AutoPap and still benefiting from it, she has been using nasal pillows successfully but lately has had some nasal congestion and would like to try a fullface mask.  She has never been on a fullface mask.  Unfortunately, she was diagnosed with NSCLC about 2 years ago, she is closely followed by oncology.  She is currently on Lorbrena  and tolerates it well.  Prior medication caused kidney impairment and fatigue.  She is holding up okay, she had a recent bout of asthma flare and was treated with a steroid course for 5 days which she just finished yesterday and she is finishing her Augmentin  for a sinus infection.  She has good psychosocial support with her family, church, and friends.  She had sustained a right femur pathological fracture, requiring intramedullary rod placement.  I reviewed her AutoPap compliance data from 08/13/2023 through 09/11/2023, which is a total of 30 days, during which time she used her machine every night with percent use days greater than 4 hours at 100%, indicating superb compliance with an average usage of 8 hours and 5 minutes, residual AHI at goal at 1.0/h, minimum pressure of 5 cm, maximum  pressure of 13 cm with EPR of 3, 95th percentile of pressure at 9.5 cm, leak on the left side with the 95th percentile at 4.3 L/min.    Previously:    07/20/2021 (VV, Amy Lomax, NP): <<Laura Bush is a 73 y.o. female here today for follow up for OSA on CPAP. She received her new CPAP machine 05/27/2021. She is doing great. She has gotten adjusted to her new machine. She denies concerns with machine or mask. She is resting well most night. She wakes feeling refreshed.    Facial weakness has improved. She is no longer biting her gum.  She continues simvastatin  20mg  and asa 81mg  daily. LDL was 66 in 09/2020. She is followed by PCP closely.  >> 05/13/2021: I reviewed her AutoPap compliance data from 04/13/2021 through 05/12/2021, which is a total of 30 days, during which time she used her machine every night with percent used days greater than 4 hours at 90%, indicating excellent compliance with an average usage of 7 hours and 22 minutes, residual AHI at 0.7/h, 95th percentile of pressure at 11.9 cm, leak on the low side with a 95th percentile at 6.8 L/min on a pressure of 5 cm to 15 cm with EPR of 3.  She reports she is still waiting for her new machine, her current AutoPap machine is older and has started making abnormal noises including whistling noises and high-pitched abnormal noises which are bothersome and sometimes she just takes off her mask in the middle of the night when she cannot stand the abnormal noise.  It is  not every night.  She gets an update from her DME company about the status of her AutoPap but does not have an actual set up date yet.  She is doing well, no additional neurological symptoms, has been on Ozempic  for weight loss.  Her husband has recently retired and they have a celebration for him tonight.      She had a brain MRI with and without contrast on 03/22/2021 and I reviewed the results: IMPRESSION: This MRI of the brain with and without contrast shows the following: 1.    Scattered T2/FLAIR hyperintense foci in the hemispheres and pons consistent with mild chronic microvascular ischemic change.  A small focus in the right lateral thalamus could represent either an expanded Virchow-Robin space or a chronic lacunar infarction.  None of the foci appear to be acute.  They were all present on the MRI dated 01/23/2021. 2.   Small mastoid effusion on the right.  This could be incidental or due to eustachian tube dysfunction. 3.   No acute findings.  Normal enhancement pattern.   We called her with her test results.   She had an echocardiogram on 03/03/2021 and results were benign.  In particular, her EF was 60 to 65%, normal left ventricular motion.  Mild concentric left ventricular hypertrophy was noted, no valvular abnormalities, aortic valve was tricuspid.   She had a carotid Doppler ultrasound on 03/03/2021 which showed less than 40% stenosis in bilateral ICAs and vertebral arteries showed antegrade flow.        02/09/21: She reports that she had been noticed to have a slight facial weakness on the left side by her dentist.  She was at a routine appointment in mid June 2022 and was noted to have a left lower facial weakness.  She herself had not noticed it but did notice that she had bitten the inside of the left lower lip a couple of times around 06 January 2021.  She did not notice any weakness elsewhere or numbness.  Just recently a few days ago when she was working on a tile floor she noticed mild numbness in the left digits 4 and 5 but when she shook her hand it went away.  She has not fallen.  She did have a slight tripping incident in the kitchen when she was wearing flip-flops and she did not pick up her left foot as well.  Overall, she feels at baseline but wanted to get checked out for these issues.  She reports a family history of brain hemorrhage.  Her father had a hemorrhagic stroke and died after the second hemorrhagic stroke.  Not sure if he had a brain aneurysm.   Patient is working on healthy lifestyle and weight loss.  She has been going to the weight management clinic and recently started on Ozempic .  She is trying to hydrate well.  She limits her alcohol to 1 or 2 glasses of wine per week.  She does not smoke.    I reviewed your office note from 01/21/2021.  You ordered a brain MRI.  She had a brain MRI without contrast on 01/23/2021 and I reviewed the results: IMPRESSION: 1. No acute or focal abnormality to explain the patient's symptoms. 2. Scattered subcortical T2 hyperintensities bilaterally are mildly advanced for age. The finding is nonspecific but can be seen in the setting of chronic microvascular ischemia, a demyelinating process such as multiple sclerosis, vasculitis, complicated migraine headaches, or as the sequelae of a prior infectious or inflammatory  process.   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 She had a lipid panel on 10/05/2020 which showed a total cholesterol of 156, triglycerides 182, HDL 54.3, LDL 66.  TSH was 0.88.   She has been compliant with CPAP therapy.  She had an interim home sleep test on 10/14/2020 which showed moderate obstructive sleep apnea with an AHI of 18.8/h, O2 nadir 86%.  I wrote for new AutoPap machine.  She has not had her new machine yet, she reports that it is estimated to be delivered in mid July 2022.   She sees cardiology once a year, she sees her eye doctor once every 1 or 2 years.  She was diagnosed with hemochromatosis recently and follows with hematology.      She saw Greig Forbes, nurse practitioner on 09/01/2020, at which time she was compliant with her AutoPap.   She needed a new machine.   She had an interim home sleep test on 10/14/2020 which indicated moderate obstructive sleep apnea with an AHI of 18.8/h, O2 nadir 86%.     04/11/19: 73 year old right-handed woman with an underlying medical history of hypertension, hyperlipidemia, heart murmur,  history of cluster headaches, reflux disease, breast cancer, status post radiation therapy and surgery, on tamoxifen , asthma, history of rosacea, history of venous insufficiency and pneumonia, and obesity, who reports a prior diagnosis of obstructive sleep apnea.  She has a CPAP machine.  She has not had evaluation in over 5 years.  Her machine is from about 2014 and I was able to review her latest compliance download for the past 30 days from 03/12/2019 through 04/10/2019 during which time she was fully compliant with treatment with an average usage of 8 hours and 3 minutes which is excellent, residual AHI at goal at 1.3/h, average pressure of 12 cm, she is actually on an AutoPap with a setting of 5 to 15 cm, leak on the low side with a 95th percentile at 3.7 L/min.  Prior sleep study results are not available for my review today.  She reports that she had sleep study testing in 2014 through the [facility] [place] .  She moved with her husband in December 2015 to [place] to be closer to their son who was expecting twins.  The twins are about 73 years old now.  She has a daughter in [place]  who has a 22-year-old and a 11-year-old.  She sleeps well with her CPAP, she is fully compliant with it.  Her husband also has a CPAP machine and they both get their supplies online.  She has been using nasal pillows successfully.  Her Epworth sleepiness score is 4 out of 24, fatigue severity score is 21 out of 63.  She sleeps without major interruptions, no nocturia or morning headaches are reported.  She generally is in bed around 10 and rise time is between 6 and 7.  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  Her husband still works.  She had a tonsillectomy at age 43.  Her weight has increased over time.  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  She had  subsequent radiation therapy and is in her fifth year of tamoxifen .  She is a non-smoker and drinks a glass of wine essentially daily, caffeine and limitation in the form of  coffee, 1 cup/day on average.  They have no pets in the household.      Her Past Medical History Is Significant For: Past Medical History:  Diagnosis Date   Allergy    seasonal   Anemia associated with stage 3 chronic renal failure (HCC) 04/18/2023   Arthritis Last several years   Mostly in hands, wrists, shoulders, knees   Asthma    in cold weather   Breast cancer (HCC) 09/18/2014   left breast   Breast cancer (HCC) 10/07/2014   bx left breast   Breast cancer of upper-outer quadrant of left female breast (HCC) 09/22/2014   Cluster headaches    in her 40's   GERD (gastroesophageal reflux disease)    Goals of care, counseling/discussion 01/14/2022   Heart murmur    as a child (has outgrown)   High cholesterol    History of radiation therapy    Left lung, Right Femur- 02/16/22-03/04/22- Dr. 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   Stroke [facility]) summer 2022   described as tiny   Swallowing difficulty    Venous insufficiency     Her Past Surgical History Is Significant For: Past Surgical History:  Procedure Laterality Date   ABDOMINAL HYSTERECTOMY  1999   APPENDECTOMY  ~ 2006   BONE BIOPSY Right 01/04/2022   Procedure: RIGHT FEMORAL BONE BIOPSY;  Surgeon: Celena Sharper, MD;  Location: [facility] OR;  Service: Orthopedics;  Laterality: Right;    BONE EXCISION Right 04/11/2023   Procedure: BONE EXCISION;  Surgeon: Celena Sharper, MD;  Location: [facility] OR;  Service: Orthopedics;  Laterality: Right;   BREAST BIOPSY Left 10/2014   BREAST LUMPECTOMY Left 2016   BREAST REDUCTION SURGERY Bilateral 11/11/2014   Procedure: Bilateral Breast Reduction;  Surgeon: Alm Sick, MD;  Location: [facility] OR;  Service: Plastics;  Laterality: Bilateral;   BRONCHIAL BIOPSY  01/06/2022   Procedure: BRONCHIAL BIOPSIES;  Surgeon: Shelah Lamar RAMAN, MD;  Location: [facility] ENDOSCOPY;  Service: Pulmonary;;   BRONCHIAL BRUSHINGS  01/06/2022   Procedure: BRONCHIAL BRUSHINGS;  Surgeon: Shelah Lamar RAMAN, MD;  Location: [facility] ENDOSCOPY;  Service: Pulmonary;;   BRONCHIAL NEEDLE ASPIRATION BIOPSY  01/06/2022   Procedure: BRONCHIAL NEEDLE ASPIRATION BIOPSIES;  Surgeon: Shelah Lamar RAMAN, MD;  Location: [facility] ENDOSCOPY;  Service: Pulmonary;;   BRONCHIAL WASHINGS  01/06/2022   Procedure: BRONCHIAL WASHINGS;  Surgeon: Shelah Lamar RAMAN, MD;  Location: [facility] ENDOSCOPY;  Service: Pulmonary;;   CARPAL TUNNEL RELEASE Bilateral    2 surgeries on right, 1 on left   CESAREAN SECTION  1978; 1980   COLONOSCOPY     FEMUR IM NAIL Right 01/04/2022   Procedure: RIGHT FEMORAL INTRAMEDULLARY (IM) NAIL;  Surgeon: Celena Sharper, MD;  Location: [facility] OR;  Service: Orthopedics;  Laterality: Right;   FIDUCIAL MARKER PLACEMENT  01/06/2022   Procedure: FIDUCIAL MARKER PLACEMENT;  Surgeon: Shelah Lamar RAMAN, MD;  Location: [facility] ENDOSCOPY;  Service: Pulmonary;;  FOREARM FRACTURE SURGERY Right 2003 X 3   FRACTURE SURGERY  2003, 2023   3 surgeries, right arm -03; Femur-23   HARDWARE REMOVAL Right 04/11/2023   Procedure: HARDWARE REMOVAL;  Surgeon: Celena Sharper, MD;  Location: [facility] OR;  Service: Orthopedics;  Laterality: Right;   KNEE ARTHROSCOPY Bilateral    meniscus repair   PARTIAL MASTECTOMY WITH NEEDLE LOCALIZATION AND AXILLARY SENTINEL LYMPH NODE BX Left 11/11/2014   Procedure: LEFT BREAST PARTIAL MASTECTOMY WITH NEEDLE  LOCALIZATION TIMES TWO AND LEFT AXILLARY SENTINEL LYMPH NODE Biopsy;  Surgeon: Elon Pacini, MD;  Location: [facility] OR;  Service: General;  Laterality: Left;   REDUCTION MAMMAPLASTY Bilateral 2016   SQUAMOUS CELL CARCINOMA EXCISION  1980's X 1   nose   TONSILLECTOMY  ~ 1957   TUBAL LIGATION  ?1984    Her Family History Is Significant For: Family History  Problem Relation Age of Onset   Dementia Mother        Lives in [place]   Hypertension Mother    Hyperlipidemia Mother    Stroke Father        deceased   Heart failure Father 87   Hypertension Father    Uterine cancer Sister 65   Heart attack Maternal Grandmother    Hypertension Maternal Grandfather    Stroke Paternal Grandfather    Colon cancer Neg Hx    Esophageal cancer Neg Hx    Pancreatic cancer Neg Hx    Stomach cancer Neg Hx    Liver disease Neg Hx    Sleep apnea Neg Hx     Her Social History Is Significant For: Social History   Socioeconomic History   Marital status: Married    Spouse name: Randy Philipp   Number of children: Not on file   Years of education: Not on file   Highest education level: Bachelor's degree (e.g., BA, AB, BS)  Occupational History   Occupation: retired  Tobacco Use   Smoking status: Never   Smokeless tobacco: Never  Vaping Use   Vaping status: Never Used  Substance and Sexual Activity   Alcohol use: Yes    Alcohol/week: 2.0 standard drinks of alcohol    Types: 2 Glasses of wine per week    Comment: 2 glasses a week   Drug use: No   Sexual activity: Not Currently  Other Topics Concern   Not on file  Social History Narrative   From [place]  origionally.   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 Update 02/09/2021 retired   2 children, boy and girl   Has 4 grandchildren   Quilting, sewing, reading.   Social Drivers of Corporate Investment Banker Strain: Low Risk  (09/08/2023)   Overall Financial Resource Strain (CARDIA)    Difficulty of Paying  Living Expenses: Not hard at all  Food Insecurity: No Food Insecurity (09/08/2023)   Hunger Vital Sign    Worried About Running Out of Food in the Last Year: Never true    Ran Out of Food in the Last Year: Never true  Transportation Needs: No Transportation Needs (09/08/2023)   PRAPARE - Administrator, Civil Service (Medical): No    Lack of Transportation (Non-Medical): No  Physical Activity: Unknown (09/08/2023)   Exercise Vital Sign    Days of Exercise per Week: 0 days    Minutes of Exercise per Session: Not on file  Stress: No Stress Concern Present (09/08/2023)   [employer] of Occupational Health -  Occupational Stress Questionnaire    Feeling of Stress : Not at all  Social Connections: Socially Integrated (09/08/2023)   Social Connection and Isolation Panel [NHANES]    Frequency of Communication with Friends and Family: More than three times a week    Frequency of Social Gatherings with Friends and Family: Three times a week    Attends Religious Services: More than 4 times per year    Active Member of Clubs or Organizations: Yes    Attends Banker Meetings: More than 4 times per year    Marital Status: Married    Her Allergies Are:  Allergies  Allergen Reactions   Diclofenac  Hypertension   Amlodipine  Swelling and Other (See Comments)    Limbs swell, not the throat   Lanolin Other (See Comments)    Sneezing and watery eyes. Allergic to Wool.   Tape Other (See Comments)    Redness (Bandaids also)  :   Her Current Medications Are:  Outpatient Encounter Medications as of 09/13/2023  Medication Sig   acetaminophen  (TYLENOL ) 500 MG tablet Take 1 tablet (500 mg total) by mouth every 8 (eight) hours as needed for mild pain or moderate pain.   Albuterol -Budesonide  90-80 MCG/ACT AERO Inhale 2 puffs into the lungs every 6 (six) hours as needed (wheezing, sob).   amoxicillin -clavulanate (AUGMENTIN ) 875-125 MG tablet Take 1 tablet by mouth 2 (two) times  daily for 7 days.   aspirin EC 81 MG tablet Take 81 mg by mouth in the morning. Swallow whole.   B Complex-C (SUPER B COMPLEX PO) Take 1 tablet by mouth daily with breakfast.   Calcium  Carb-Cholecalciferol (CALCIUM  + D3 PO) Take 1 tablet by mouth in the morning.   cholecalciferol (VITAMIN D3) 25 MCG (1000 UNIT) tablet Take 1,000 Units by mouth in the morning.   COLLAGEN PO Take 3 tablets by mouth in the morning and at bedtime. With Biotin   Denosumab  (XGEVA  [place]) Inject into the skin every 3 (three) months.   Evolocumab  (REPATHA  SURECLICK) 140 MG/ML SOAJ Inject 140 mg into the skin every 14 (fourteen) days.   fexofenadine (ALLEGRA) 180 MG tablet Take 180 mg by mouth in the morning.   folic acid  (FOLATE) 400 MCG tablet Take 400 mcg by mouth in the morning.   furosemide  (LASIX ) 20 MG tablet Take Lasix  two tablets (20 mg each) once a day   ibuprofen (ADVIL) 200 MG tablet Take 600 mg by mouth daily as needed.   ipratropium (ATROVENT ) 0.06 % nasal spray Place 2 sprays into both nostrils 4 (four) times daily as needed for rhinitis.   lorlatinib  (LORBRENA ) 100 MG tablet Take 1 tablet (100 mg total) by mouth daily. Swallow tablets whole. Do not chew, crush or split tablets.   metFORMIN  (GLUCOPHAGE ) 500 MG tablet TAKE TWO TABELTS AT LUNCH AND ONE TABLET AT DINNER (Patient taking differently: Take 500 mg by mouth 2 (two) times daily with a meal.)   NIFEdipine  (PROCARDIA -XL/NIFEDICAL-XL) 30 MG 24 hr tablet Take 1 tablet (30 mg total) by mouth daily.   pantoprazole  (PROTONIX ) 40 MG tablet Take 1 tablet (40 mg total) by mouth daily.   spironolactone  (ALDACTONE ) 25 MG tablet Take 1 tablet (25 mg total) by mouth daily.   traZODone  (DESYREL ) 50 MG tablet TAKE 1 TABLET BY MOUTH EVERY NIGHT AT BEDTIME AS NEEDED FOR SLEEP (Patient taking differently: Take 50 mg by mouth at bedtime. for sleep)   [DISCONTINUED] polyethylene glycol (MIRALAX  / GLYCOLAX ) 17 g packet Take 17 g by mouth  daily as needed (constipation).    [DISCONTINUED] pravastatin  (PRAVACHOL ) 20 MG tablet Take 20 mg by mouth daily.   [DISCONTINUED] predniSONE  (DELTASONE ) 50 MG tablet Take 1 tab daily for 5 days.   No facility-administered encounter medications on file as of 09/13/2023.  :  Review of Systems:  Out of a complete 14 point review of systems, all are reviewed and negative with the exception of these symptoms as listed below:  Review of Systems  Neurological:        Patient in room #9 and alone. Patient states she here today for a mask change for her cpap machine.    Objective:  Neurological Exam  Physical Exam Physical Examination:   Vitals:   09/13/23 0721  BP: 130/86  Pulse: 69    General Examination: The patient is a very pleasant 73 y.o. female in no acute distress. She appears well-developed and well-nourished and well groomed.   HEENT: Normocephalic, atraumatic, pupils are equal, round and reactive to light, extraocular tracking is well-preserved, corrective eyeglasses in place, hearing grossly intact.  Face is symmetric, speech is clear, no dysarthria, no voice tremor, airway examination is stable, neck is supple, no carotid bruits.  Tongue protrudes centrally and palate elevates symmetrically.     Chest: Clear to auscultation with left midlung rhonchi but no wheezing noted.  Intermittent coughing spells.     Heart: S1+S2+0, regular and normal without murmurs, rubs or gallops noted.   Abdomen: Soft, non-tender and non-distended.   Extremities: There is no obvious edema.    Skin: Warm and dry without trophic changes noted. There are no varicose veins.   Musculoskeletal: exam reveals arthritic changes in both hands.     Neurologically: Mental status: The patient is awake, alert and oriented in all 4 spheres. Her immediate and remote memory, attention, language skills and fund of knowledge are appropriate. There is no evidence of aphasia, agnosia, apraxia or anomia. Speech is clear with normal prosody and  enunciation. Thought process is linear. Mood is normal and affect is normal. Cranial nerves II - XII are as described above under HEENT exam.  Motor exam: Normal bulk, strength and tone is noted.  No tremor.  Fine motor skills and coordination: Intact grossly in the upper and lower extremities.   Cerebellar testing: No dysmetria or intention tremor. There is no truncal or gait ataxia. Sensory exam: intact to light touch in the upper and lower extremities. Gait, station and balance: She stands easily. No veering to one side is noted. No leaning to one side is noted. Posture is age-appropriate and stance is narrow based. Gait shows normal stride length and normal pace. No problems turning are noted.               Assessment and Plan:  In summary, Jeslyn W Guidry is a very pleasant 73 year old female with an underlying medical history of non-small cell lung cancer, right femur fracture with rod placement, hypertension, hyperlipidemia, heart murmur, history of cluster headaches, reflux disease, breast cancer, status post radiation therapy and surgery, on tamoxifen , asthma, history of rosacea, history of venous insufficiency and pneumonia, hemochromatosis, and obesity, who presents for follow-up consultation of her obstructive sleep apnea, treated with AutoPap therapy.  She continues to be fully compliant with her AutoPap.  She has been using nasal pillows with good success but would like to try a fullface mask.  I provided her with a [employer]Med F40 fullface mask sample pack so she can try it out.  If  she likes this kind, she can order further masks from her DME supplier.  She can also request a different mask through them if she does not like the F40.  She is commended for treatment adherence.  She is in good spirits, she is in close follow-up with oncology.  She is advised to follow-up routinely with us  for sleep apnea management in about a year, sooner if needed.  I answered all her questions today and she was  in agreement.

## 2023-09-13 NOTE — Progress Notes (Signed)
Cpap supply order sent to [facility].

## 2023-09-13 NOTE — Patient Instructions (Signed)
 I wish you all the best with your ongoing treatment. You are fully compliant with your AutoPap, please try the mask sample I provided, if you like it, you can reorder it through [facility]. You can also request a different mask from them.

## 2023-09-14 ENCOUNTER — Ambulatory Visit: Payer: Medicare HMO | Admitting: Neurology

## 2023-09-14 NOTE — Progress Notes (Signed)
 New, Maryella Shivers, Otilio Jefferson, RN; Alain Honey; Jeris Penta, [place]; 1 other Received, thank you!

## 2023-09-18 ENCOUNTER — Encounter: Payer: Self-pay | Admitting: Neurology

## 2023-09-19 DIAGNOSIS — G4733 Obstructive sleep apnea (adult) (pediatric): Secondary | ICD-10-CM | POA: Diagnosis not present

## 2023-09-21 ENCOUNTER — Other Ambulatory Visit: Payer: Self-pay

## 2023-09-22 ENCOUNTER — Inpatient Hospital Stay: Payer: Medicare HMO | Attending: Hematology & Oncology

## 2023-09-22 ENCOUNTER — Encounter: Payer: Self-pay | Admitting: Hematology & Oncology

## 2023-09-22 ENCOUNTER — Inpatient Hospital Stay: Payer: Medicare HMO

## 2023-09-22 ENCOUNTER — Inpatient Hospital Stay (HOSPITAL_BASED_OUTPATIENT_CLINIC_OR_DEPARTMENT_OTHER): Payer: Medicare HMO | Admitting: Hematology & Oncology

## 2023-09-22 VITALS — BP 138/74 | HR 59 | Temp 98.1°F | Resp 19 | Ht 59.0 in | Wt 190.1 lb

## 2023-09-22 DIAGNOSIS — D509 Iron deficiency anemia, unspecified: Secondary | ICD-10-CM | POA: Insufficient documentation

## 2023-09-22 DIAGNOSIS — C3490 Malignant neoplasm of unspecified part of unspecified bronchus or lung: Secondary | ICD-10-CM | POA: Diagnosis not present

## 2023-09-22 DIAGNOSIS — C7951 Secondary malignant neoplasm of bone: Secondary | ICD-10-CM | POA: Diagnosis not present

## 2023-09-22 DIAGNOSIS — C3432 Malignant neoplasm of lower lobe, left bronchus or lung: Secondary | ICD-10-CM | POA: Diagnosis not present

## 2023-09-22 LAB — CMP (CANCER CENTER ONLY)
ALT: 19 U/L (ref 0–44)
AST: 16 U/L (ref 15–41)
Albumin: 3.9 g/dL (ref 3.5–5.0)
Alkaline Phosphatase: 37 U/L — ABNORMAL LOW (ref 38–126)
Anion gap: 9 (ref 5–15)
BUN: 19 mg/dL (ref 8–23)
CO2: 29 mmol/L (ref 22–32)
Calcium: 9.8 mg/dL (ref 8.9–10.3)
Chloride: 105 mmol/L (ref 98–111)
Creatinine: 1.04 mg/dL — ABNORMAL HIGH (ref 0.44–1.00)
GFR, Estimated: 57 mL/min — ABNORMAL LOW (ref >60)
Glucose, Bld: 100 mg/dL — ABNORMAL HIGH (ref 70–99)
Potassium: 4.1 mmol/L (ref 3.5–5.1)
Sodium: 143 mmol/L (ref 135–145)
Total Bilirubin: 0.3 mg/dL (ref 0.0–1.2)
Total Protein: 7.2 g/dL (ref 6.5–8.1)

## 2023-09-22 LAB — CBC WITH DIFFERENTIAL (CANCER CENTER ONLY)
Abs Immature Granulocytes: 0.18 10*3/uL — ABNORMAL HIGH (ref 0.00–0.07)
Basophils Absolute: 0.1 10*3/uL (ref 0.0–0.1)
Basophils Relative: 1 %
Eosinophils Absolute: 0.2 10*3/uL (ref 0.0–0.5)
Eosinophils Relative: 5 %
HCT: 33.7 % — ABNORMAL LOW (ref 36.0–46.0)
Hemoglobin: 11.1 g/dL — ABNORMAL LOW (ref 12.0–15.0)
Immature Granulocytes: 4 %
Lymphocytes Relative: 21 %
Lymphs Abs: 1.1 10*3/uL (ref 0.7–4.0)
MCH: 32.6 pg (ref 26.0–34.0)
MCHC: 32.9 g/dL (ref 30.0–36.0)
MCV: 98.8 fL (ref 80.0–100.0)
Monocytes Absolute: 0.7 10*3/uL (ref 0.1–1.0)
Monocytes Relative: 13 %
Neutro Abs: 2.9 10*3/uL (ref 1.7–7.7)
Neutrophils Relative %: 56 %
Platelet Count: 172 10*3/uL (ref 150–400)
RBC: 3.41 MIL/uL — ABNORMAL LOW (ref 3.87–5.11)
RDW: 16.3 % — ABNORMAL HIGH (ref 11.5–15.5)
WBC Count: 5.1 10*3/uL (ref 4.0–10.5)
nRBC: 0 % (ref 0.0–0.2)

## 2023-09-22 LAB — FERRITIN: Ferritin: 196 ng/mL (ref 11–307)

## 2023-09-22 LAB — IRON AND IRON BINDING CAPACITY (CC-WL,HP ONLY)
Iron: 98 ug/dL (ref 28–170)
Saturation Ratios: 36 % — ABNORMAL HIGH (ref 10.4–31.8)
TIBC: 272 ug/dL (ref 250–450)
UIBC: 174 ug/dL (ref 148–442)

## 2023-09-22 LAB — RETICULOCYTES
Immature Retic Fract: 18.6 % — ABNORMAL HIGH (ref 2.3–15.9)
RBC.: 3.38 MIL/uL — ABNORMAL LOW (ref 3.87–5.11)
Retic Count, Absolute: 76.4 10*3/uL (ref 19.0–186.0)
Retic Ct Pct: 2.3 % (ref 0.4–3.1)

## 2023-09-22 NOTE — Progress Notes (Signed)
Hematology and Oncology Follow Up Visit  Zulma Watson [place] [id number] [birth year] 73 y.o. 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 3 months  -next dose in 11/2023   S/P SBRT to LUL -- completed on 03/04/2022 S/P XRT to RIGHT femur -- completed on 03/04/2022 Alecensa 600 mg po BID -- start on 03/24/2022 --on hold starting 04/04/2023 -restart 300 mg p.o. twice daily on 05/04/2023 --DC on 05/26/2023 Lorbrena 100 mg po q day  -- start on 06/14/2023 Aranesp 300 mcg subcu every 3 weeks Venofer-given on 07/10/2023     Interim History:  Ms. [place] is back for follow-up.  She and her family had a wonderful time on their [employer] cruise.  They went to the [place].  She got back a couple weeks ago.  She show me some pictures.  I am so happy that she was able to enjoy it.  She has had no problems with pain.  She had a little bit of swelling in the feet.  She has had no problems with fever.  She has had little bit of a cough but it is nonproductive.  She has responded well to the Aranesp.  Her hemoglobin is down a little bit today but not low enough for [place] to give Aranesp.  Her last PET scan was done back in November.  We will do another 1 before we see her back in March.  She has had no problems with bowels or bladder.  There is been no diarrhea.  She does take a little bit of Lasix.  She has had no rashes.  There is been no headache.  She has had no mouth sores.  Overall, I would say performance status is probably ECOG 1.    .    Medications:  Current Outpatient Medications:    acetaminophen (TYLENOL) 500 MG tablet, Take 1 tablet (500 mg total) by mouth every 8 (eight) hours as needed for mild pain or moderate pain., Disp: 30 tablet, Rfl: 0    Albuterol-Budesonide 90-80 MCG/ACT AERO, Inhale 2 puffs into the lungs every 6 (six) hours as needed (wheezing, sob)., Disp: 10.7 g, Rfl: 1   aspirin EC 81 MG tablet, Take 81 mg by mouth in the morning. Swallow whole., Disp: , Rfl:    B Complex-C (SUPER B COMPLEX PO), Take 1 tablet by mouth daily with breakfast., Disp: , Rfl:    Calcium Carb-Cholecalciferol (CALCIUM + D3 PO), Take 1 tablet by mouth in the morning., Disp: , Rfl:    cholecalciferol (VITAMIN D3) 25 MCG (1000 UNIT) tablet, Take 1,000 Units by mouth in the morning., Disp: , Rfl:    COLLAGEN PO, Take 3 tablets by mouth in the morning and at bedtime. With Biotin, Disp: , Rfl:    Denosumab (XGEVA [place]), Inject into the skin every 3 (three) months., Disp: , Rfl:    Evolocumab (REPATHA SURECLICK) 140 MG/ML SOAJ, Inject 140 mg into the skin every 14 (fourteen) days., Disp: 6 mL, Rfl: 3   fexofenadine (ALLEGRA) 180 MG tablet, Take 180 mg by mouth in the morning., Disp: , Rfl:    folic acid (FOLATE) 400 MCG tablet, Take 400 mcg by mouth in the morning., Disp: , Rfl:    furosemide (LASIX) 20 MG tablet, Take Lasix two tablets (20 mg each)  once a day, Disp: 60 tablet, Rfl: 4   ibuprofen (ADVIL) 200 MG tablet, Take 600 mg by mouth daily as needed., Disp: , Rfl:    ipratropium (ATROVENT) 0.06 % nasal spray, Place 2 sprays into both nostrils 4 (four) times daily as needed for rhinitis., Disp: 15 mL, Rfl: 4   lorlatinib (LORBRENA) 100 MG tablet, Take 1 tablet (100 mg total) by mouth daily. Swallow tablets whole. Do not chew, crush or split tablets., Disp: 30 tablet, Rfl: 0   metFORMIN (GLUCOPHAGE) 500 MG tablet, TAKE TWO TABELTS AT LUNCH AND ONE TABLET AT DINNER, Disp: 270 tablet, Rfl: 0   NIFEdipine (PROCARDIA-XL/NIFEDICAL-XL) 30 MG 24 hr tablet, Take 1 tablet (30 mg total) by mouth daily., Disp: 90 tablet, Rfl: 1   pantoprazole (PROTONIX) 40 MG tablet, Take 1 tablet (40 mg total) by mouth daily., Disp: 90 tablet, Rfl: 0   spironolactone (ALDACTONE) 25  MG tablet, Take 1 tablet (25 mg total) by mouth daily., Disp: 90 tablet, Rfl: 3   traZODone (DESYREL) 50 MG tablet, TAKE 1 TABLET BY MOUTH EVERY NIGHT AT BEDTIME AS NEEDED FOR SLEEP (Patient taking differently: Take 50 mg by mouth at bedtime. for sleep), Disp: 90 tablet, Rfl: 2  Allergies:  Allergies  Allergen Reactions   Diclofenac Hypertension   Amlodipine Swelling and Other (See Comments)    Limbs swell, not the throat   Lanolin Other (See Comments)    Sneezing and watery eyes. Allergic to [facility].   Tape Other (See Comments)    Redness (Bandaids also)    Past Medical History, Surgical history, Social history, and Family History were reviewed and updated.  Review of Systems: Review of Systems  Constitutional: Negative.   HENT:  Negative.    Eyes: Negative.   Respiratory: Negative.    Cardiovascular: Negative.   Gastrointestinal: Negative.   Endocrine: Negative.   Genitourinary: Negative.    Musculoskeletal:  Positive for arthralgias.  Skin: Negative.   Neurological: Negative.   Hematological: Negative.   Psychiatric/Behavioral: Negative.      Physical Exam: Vital signs show temperature of 98.1.  Pulse 59.  Blood pressure 138/74.  Weight is 190 pounds.  Wt Readings from Last 3 Encounters:  09/22/23 190 lb 1.9 oz (86.2 kg)  09/13/23 191 lb 12.8 oz (87 kg)  09/08/23 185 lb 3.2 oz (84 kg)    Physical Exam Vitals reviewed.  HENT:     Head: Normocephalic and atraumatic.  Eyes:     Pupils: Pupils are equal, round, and reactive to light.  Cardiovascular:     Rate and Rhythm: Normal rate and regular rhythm.     Heart sounds: Normal heart sounds.  Pulmonary:     Effort: Pulmonary effort is normal.     Breath sounds: Normal breath sounds.  Abdominal:     General: Bowel sounds are normal.     Palpations: Abdomen is soft.  Musculoskeletal:        General: No tenderness or deformity. Normal range of motion.     Cervical back: Normal range of motion.     Comments: She  has about 2+ edema in the lower legs.  I cannot palpate any venous cord.  She has a negative Homans' sign.  Lymphadenopathy:     Cervical: No cervical adenopathy.  Skin:    General: Skin is warm and dry.     Findings: No erythema or rash.  Neurological:     Mental Status: She is alert and oriented to person, place, and  time.  Psychiatric:        Behavior: Behavior normal.        Thought Content: Thought content normal.        Judgment: Judgment normal.      Lab Results  Component Value Date   WBC 5.1 09/22/2023   HGB 11.1 (L) 09/22/2023   HCT 33.7 (L) 09/22/2023   MCV 98.8 09/22/2023   PLT 172 09/22/2023     Chemistry      Component Value Date/Time   NA 137 08/25/2023 0934   NA 142 07/17/2023 1358   NA 142 05/29/2017 1002   K 4.3 08/25/2023 0934   K 3.9 05/29/2017 1002   CL 98 08/25/2023 0934   CO2 28 08/25/2023 0934   CO2 28 05/29/2017 1002   BUN 26 (H) 08/25/2023 0934   BUN 26 07/17/2023 1358   BUN 13.5 05/29/2017 1002   CREATININE 1.11 (H) 08/25/2023 0934   CREATININE 0.81 04/06/2020 1059   CREATININE 0.8 05/29/2017 1002      Component Value Date/Time   CALCIUM 11.0 (H) 08/25/2023 0934   CALCIUM 9.4 05/29/2017 1002   ALKPHOS 46 08/25/2023 0934   ALKPHOS 39 (L) 05/29/2017 1002   AST 17 08/25/2023 0934   AST 18 05/29/2017 1002   ALT 20 08/25/2023 0934   ALT 19 05/29/2017 1002   BILITOT 0.3 08/25/2023 0934   BILITOT 0.48 05/29/2017 1002       Impression and Plan: Ms. [place] is a very charming 73 year old white female.  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  This was actually a small lesion.  Now, [place]  I do believe that she is doing quite well.  Her last PET scan I think was back in November.  Again, we will send a PET scan in March.  Hopefully, we will see that she is responding.  She does not need any Aranesp today.  I am also glad that she had a wonderful time on her [place] cruise.  We will plan to  get her back in 1 month.    Josph Macho, MD 2/14/20259:39 AM

## 2023-09-22 NOTE — Addendum Note (Signed)
Addended by: Margaretha Seeds on: 09/22/2023 10:19 AM   Modules accepted: Orders

## 2023-09-25 ENCOUNTER — Encounter (HOSPITAL_COMMUNITY)
Admission: RE | Admit: 2023-09-25 | Discharge: 2023-09-25 | Disposition: A | Discharge status: Home / Self Care | Payer: Medicare HMO | Source: Ambulatory Visit | Attending: Hematology & Oncology | Admitting: Hematology & Oncology

## 2023-09-25 DIAGNOSIS — C7951 Secondary malignant neoplasm of bone: Secondary | ICD-10-CM | POA: Insufficient documentation

## 2023-09-25 DIAGNOSIS — C3411 Malignant neoplasm of upper lobe, right bronchus or lung: Secondary | ICD-10-CM | POA: Diagnosis not present

## 2023-09-25 DIAGNOSIS — C3490 Malignant neoplasm of unspecified part of unspecified bronchus or lung: Secondary | ICD-10-CM | POA: Insufficient documentation

## 2023-09-25 DIAGNOSIS — C3432 Malignant neoplasm of lower lobe, left bronchus or lung: Secondary | ICD-10-CM | POA: Diagnosis not present

## 2023-09-25 LAB — GLUCOSE, CAPILLARY: Glucose-Capillary: 97 mg/dL (ref 70–99)

## 2023-09-25 MED ORDER — FLUDEOXYGLUCOSE F - 18 (FDG) INJECTION
9.5000 | Freq: Once | INTRAVENOUS | Status: AC
Start: 2023-09-25 — End: 2023-09-25
  Administered 2023-09-25: 9.5 via INTRAVENOUS

## 2023-09-26 ENCOUNTER — Other Ambulatory Visit: Payer: Self-pay

## 2023-09-26 ENCOUNTER — Other Ambulatory Visit: Payer: Self-pay | Admitting: Hematology & Oncology

## 2023-09-26 ENCOUNTER — Encounter (HOSPITAL_BASED_OUTPATIENT_CLINIC_OR_DEPARTMENT_OTHER): Payer: Self-pay | Admitting: Cardiovascular Disease

## 2023-09-26 DIAGNOSIS — C7951 Secondary malignant neoplasm of bone: Secondary | ICD-10-CM

## 2023-09-26 NOTE — Progress Notes (Signed)
Refill request pending - will complete encounter when refill comes in

## 2023-09-27 ENCOUNTER — Other Ambulatory Visit (HOSPITAL_COMMUNITY): Payer: Self-pay

## 2023-09-27 ENCOUNTER — Other Ambulatory Visit: Payer: Self-pay

## 2023-09-27 MED ORDER — LORLATINIB 100 MG PO TABS
100.0000 mg | ORAL_TABLET | Freq: Every day | ORAL | 0 refills | Status: DC
  Filled 2023-09-27: qty 30, 30d supply, fill #0

## 2023-09-27 NOTE — Progress Notes (Signed)
09/27/23-CA: Laura Bush  Ship 02/24 or 02/25. Pt is ok with receiving on 02/25 or 02/26. Will have to order medication through [facility]. Will call patient on 02/24 to confirm copay before shipping.

## 2023-09-28 ENCOUNTER — Ambulatory Visit: Payer: Medicare HMO

## 2023-09-28 ENCOUNTER — Other Ambulatory Visit (HOSPITAL_COMMUNITY): Payer: Self-pay

## 2023-09-29 ENCOUNTER — Other Ambulatory Visit (HOSPITAL_COMMUNITY): Payer: Self-pay

## 2023-10-02 ENCOUNTER — Other Ambulatory Visit (HOSPITAL_COMMUNITY): Payer: Self-pay

## 2023-10-02 ENCOUNTER — Other Ambulatory Visit: Payer: Self-pay

## 2023-10-02 ENCOUNTER — Encounter (HOSPITAL_COMMUNITY): Payer: Self-pay

## 2023-10-02 DIAGNOSIS — Z0100 Encounter for examination of eyes and vision without abnormal findings: Secondary | ICD-10-CM | POA: Diagnosis not present

## 2023-10-04 ENCOUNTER — Encounter: Payer: Self-pay | Admitting: *Deleted

## 2023-10-05 ENCOUNTER — Encounter: Payer: Self-pay | Admitting: *Deleted

## 2023-10-09 ENCOUNTER — Other Ambulatory Visit: Payer: Self-pay | Admitting: Hematology & Oncology

## 2023-10-09 DIAGNOSIS — C50812 Malignant neoplasm of overlapping sites of left female breast: Secondary | ICD-10-CM

## 2023-10-10 ENCOUNTER — Ambulatory Visit
Admission: RE | Admit: 2023-10-10 | Discharge: 2023-10-10 | Disposition: A | Discharge status: Home / Self Care | Payer: Medicare HMO | Source: Ambulatory Visit | Attending: Hematology & Oncology | Admitting: Hematology & Oncology

## 2023-10-10 DIAGNOSIS — Z1231 Encounter for screening mammogram for malignant neoplasm of breast: Secondary | ICD-10-CM | POA: Diagnosis not present

## 2023-10-11 ENCOUNTER — Encounter: Payer: Self-pay | Admitting: *Deleted

## 2023-10-11 DIAGNOSIS — Z0100 Encounter for examination of eyes and vision without abnormal findings: Secondary | ICD-10-CM | POA: Diagnosis not present

## 2023-10-18 ENCOUNTER — Encounter (HOSPITAL_BASED_OUTPATIENT_CLINIC_OR_DEPARTMENT_OTHER): Payer: Self-pay | Admitting: Cardiovascular Disease

## 2023-10-18 ENCOUNTER — Encounter: Payer: Self-pay | Admitting: Family Medicine

## 2023-10-18 ENCOUNTER — Encounter: Payer: Self-pay | Admitting: Hematology & Oncology

## 2023-10-19 NOTE — Telephone Encounter (Signed)
 Please review and advise.

## 2023-10-20 MED ORDER — NIFEDIPINE ER OSMOTIC RELEASE 60 MG PO TB24: 60.0000 mg | ORAL_TABLET | Freq: Every day | ORAL | 1 refills | Status: DC

## 2023-10-22 ENCOUNTER — Encounter: Payer: Self-pay | Admitting: Family Medicine

## 2023-10-23 ENCOUNTER — Other Ambulatory Visit: Payer: Self-pay

## 2023-10-23 ENCOUNTER — Emergency Department (HOSPITAL_BASED_OUTPATIENT_CLINIC_OR_DEPARTMENT_OTHER)

## 2023-10-23 ENCOUNTER — Encounter (HOSPITAL_BASED_OUTPATIENT_CLINIC_OR_DEPARTMENT_OTHER): Payer: Self-pay

## 2023-10-23 ENCOUNTER — Ambulatory Visit: Payer: Self-pay | Admitting: Family Medicine

## 2023-10-23 ENCOUNTER — Ambulatory Visit (INDEPENDENT_AMBULATORY_CARE_PROVIDER_SITE_OTHER): Admitting: Family

## 2023-10-23 ENCOUNTER — Emergency Department (HOSPITAL_BASED_OUTPATIENT_CLINIC_OR_DEPARTMENT_OTHER): Admission: EM | Admit: 2023-10-23 | Discharge: 2023-10-23 | Disposition: A | Discharge status: Home / Self Care

## 2023-10-23 VITALS — BP 150/72 | HR 71 | Temp 98.2°F | Resp 18 | Ht 59.0 in | Wt 194.0 lb

## 2023-10-23 DIAGNOSIS — I10 Essential (primary) hypertension: Secondary | ICD-10-CM | POA: Insufficient documentation

## 2023-10-23 DIAGNOSIS — Z7982 Long term (current) use of aspirin: Secondary | ICD-10-CM | POA: Diagnosis not present

## 2023-10-23 DIAGNOSIS — Z85118 Personal history of other malignant neoplasm of bronchus and lung: Secondary | ICD-10-CM | POA: Insufficient documentation

## 2023-10-23 DIAGNOSIS — Z7984 Long term (current) use of oral hypoglycemic drugs: Secondary | ICD-10-CM | POA: Diagnosis not present

## 2023-10-23 DIAGNOSIS — R519 Headache, unspecified: Secondary | ICD-10-CM | POA: Insufficient documentation

## 2023-10-23 DIAGNOSIS — E119 Type 2 diabetes mellitus without complications: Secondary | ICD-10-CM | POA: Insufficient documentation

## 2023-10-23 DIAGNOSIS — R200 Anesthesia of skin: Secondary | ICD-10-CM | POA: Insufficient documentation

## 2023-10-23 DIAGNOSIS — I6782 Cerebral ischemia: Secondary | ICD-10-CM | POA: Diagnosis not present

## 2023-10-23 DIAGNOSIS — R600 Localized edema: Secondary | ICD-10-CM | POA: Diagnosis not present

## 2023-10-23 LAB — CBC
HCT: 37 % (ref 36.0–46.0)
Hemoglobin: 12.3 g/dL (ref 12.0–15.0)
MCH: 32.5 pg (ref 26.0–34.0)
MCHC: 33.2 g/dL (ref 30.0–36.0)
MCV: 97.6 fL (ref 80.0–100.0)
Platelets: 173 10*3/uL (ref 150–400)
RBC: 3.79 MIL/uL — ABNORMAL LOW (ref 3.87–5.11)
RDW: 14.3 % (ref 11.5–15.5)
WBC: 5.1 10*3/uL (ref 4.0–10.5)
nRBC: 0 % (ref 0.0–0.2)

## 2023-10-23 LAB — BASIC METABOLIC PANEL
Anion gap: 12 (ref 5–15)
BUN: 32 mg/dL — ABNORMAL HIGH (ref 8–23)
CO2: 25 mmol/L (ref 22–32)
Calcium: 9.9 mg/dL (ref 8.9–10.3)
Chloride: 100 mmol/L (ref 98–111)
Creatinine, Ser: 0.91 mg/dL (ref 0.44–1.00)
GFR, Estimated: >60 mL/min (ref >60)
Glucose, Bld: 106 mg/dL — ABNORMAL HIGH (ref 70–99)
Potassium: 3.4 mmol/L — ABNORMAL LOW (ref 3.5–5.1)
Sodium: 137 mmol/L (ref 135–145)

## 2023-10-23 LAB — TROPONIN I (HIGH SENSITIVITY)
Troponin I (High Sensitivity): 5 ng/L (ref <18)
Troponin I (High Sensitivity): 5 ng/L (ref <18)

## 2023-10-23 MED ORDER — AMOXICILLIN-POT CLAVULANATE 875-125 MG PO TABS: 1.0000 | ORAL_TABLET | Freq: Two times a day (BID) | ORAL | 0 refills | Status: DC

## 2023-10-23 MED ORDER — IOHEXOL 300 MG/ML  SOLN
75.0000 mL | Freq: Once | INTRAMUSCULAR | Status: AC | PRN
  Administered 2023-10-23: 75 mL via INTRAVENOUS

## 2023-10-23 NOTE — Assessment & Plan Note (Signed)
 Reports recent increase in nifedipine dose which could be a contributor to her edema.   She is also on [place] per Oncology which may be contributing.  She plans to address this concern at her next appointment with oncology.

## 2023-10-23 NOTE — Assessment & Plan Note (Addendum)
 BP mildly elevated today- but pt took sudafed last night. Advised pt to stop sudafed.  Continue to follow with the [facility] (Dr. Duke Salvia).

## 2023-10-23 NOTE — Assessment & Plan Note (Signed)
 New. Facial pain and swelling likely due to sinusitis or dental abscess. No fever or significant nasal congestion, but postnasal drip is present. Treatment with Augmentin expected to address both conditions. - Prescribe Augmentin (amoxicillin/clavulanate) 1 tablet twice a day for 7 days. - Recommend acetaminophen as needed for pain. - Advise dental visit if symptoms do not improve. - Instruct to report fever, increased pain or swelling.

## 2023-10-23 NOTE — ED Notes (Signed)
 Pt taking 400 mg ibuprofen from home. EDP approved

## 2023-10-23 NOTE — Telephone Encounter (Signed)
 Pt seeing Melissa today at 10:40 AM.

## 2023-10-23 NOTE — ED Provider Notes (Signed)
 [facility] Provider Note   CSN: [id number] Arrival date & time: 10/23/23  1448     History  Chief Complaint  Patient presents with   Numbness    Laura Bush is a 73 y.o. female.  73 year old female with past medical history of lung cancer with metastases to her right femur as well as diabetes and hypertension presenting to the emergency department today with pain in the left side of her face.  Patient states this been going on for the past week or so.  States he was having this pain around her teeth on the upper left side and then felt some sinus congestion.  She states that she has had intermittent issues over the past few days when the pain gets worse where she does have numbness in the left side of her face and thinks that she may have noticed some facial asymmetry.  This has been waxing and waning in intensity.  She denies any associated headache with this.  Denies any focal weakness, numbness, or tingling in her extremities.  She states that she did have a PET scan performed a few weeks ago that did show an abnormality in one of her sinuses.        Home Medications Prior to Admission medications   Medication Sig Start Date End Date Taking? Authorizing Provider  acetaminophen (TYLENOL) 500 MG tablet Take 1 tablet (500 mg total) by mouth every 8 (eight) hours as needed for mild pain or moderate pain. 01/07/22   Montez Morita, PA-C  Albuterol-Budesonide 90-80 MCG/ACT AERO Inhale 2 puffs into the lungs every 6 (six) hours as needed (wheezing, sob). 09/08/23   Sharlene Dory, DO  amoxicillin-clavulanate (AUGMENTIN) 875-125 MG tablet Take 1 tablet by mouth 2 (two) times daily. 10/23/23   Sandford Craze, NP  aspirin EC 81 MG tablet Take 81 mg by mouth in the morning. Swallow whole.    [provider]  B Complex-C (SUPER B COMPLEX PO) Take 1 tablet by mouth daily with breakfast.    [provider]  Calcium  Carb-Cholecalciferol (CALCIUM + D3 PO) Take 1 tablet by mouth in the morning.    [provider]  cholecalciferol (VITAMIN D3) 25 MCG (1000 UNIT) tablet Take 1,000 Units by mouth in the morning.    [provider]  COLLAGEN PO Take 3 tablets by mouth in the morning and at bedtime. With Biotin    [provider]  Denosumab (XGEVA [place]) Inject into the skin every 3 (three) months.    [provider]  Evolocumab (REPATHA SURECLICK) 140 MG/ML SOAJ Inject 140 mg into the skin every 14 (fourteen) days. 07/22/23   Chilton Si, MD  fexofenadine (ALLEGRA) 180 MG tablet Take 180 mg by mouth in the morning.    [provider]  folic acid (FOLATE) 400 MCG tablet Take 400 mcg by mouth in the morning.    [provider]  furosemide (LASIX) 20 MG tablet Take Lasix two tablets (20 mg each) once a day 07/28/23   Josph Macho, MD  ibuprofen (ADVIL) 200 MG tablet Take 600 mg by mouth daily as needed.    [provider]  ipratropium (ATROVENT) 0.06 % nasal spray Place 2 sprays into both nostrils 4 (four) times daily as needed for rhinitis. 01/31/23   Copland, Gwenlyn Found, MD  lorlatinib (LORBRENA) 100 MG tablet Take 1 tablet (100 mg total) by mouth daily. Swallow tablets whole. Do not chew, crush  or split tablets. 09/27/23   Josph Macho, MD  metFORMIN (GLUCOPHAGE) 500 MG tablet TAKE TWO TABELTS AT LUNCH AND ONE TABLET AT [facility] 03/16/23   [place], Dawn, FNP  NIFEdipine (PROCARDIA XL/NIFEDICAL XL) 60 MG 24 hr tablet Take 1 tablet (60 mg total) by mouth daily. 10/20/23   Chilton Si, MD  pantoprazole (PROTONIX) 40 MG tablet Take 1 tablet (40 mg total) by mouth daily. 08/17/23   Copland, Gwenlyn Found, MD  spironolactone (ALDACTONE) 25 MG tablet Take 1 tablet (25 mg total) by mouth daily. 07/03/23   Chilton Si, MD  traZODone (DESYREL) 50 MG tablet TAKE 1 TABLET BY MOUTH EVERY NIGHT AT BEDTIME AS NEEDED FOR SLEEP Patient taking differently: Take 50  mg by mouth at bedtime. for sleep 03/25/23   Josph Macho, MD      Allergies    Diclofenac, Amlodipine, Lanolin, and Tape    Review of Systems   Review of Systems  Neurological:  Positive for facial asymmetry and numbness.  All other systems reviewed and are negative.   Physical Exam Updated Vital Signs BP 136/69   Pulse 69   Temp 97.7 F (36.5 C)   Resp 20   SpO2 98%  Physical Exam Vitals and nursing note reviewed.   Gen: NAD Eyes: PERRL, EOMI HEENT: no oropharyngeal swelling Neck: trachea midline Resp: clear to auscultation bilaterally Card: RRR, no murmurs, rubs, or gallops Abd: nontender, nondistended Extremities: no calf tenderness, no edema Vascular: 2+ radial pulses bilaterally, 2+ DP pulses bilaterally Neuro: No focal deficits Skin: no rashes Psyc: acting appropriately   ED Results / Procedures / Treatments   Labs (all labs ordered are listed, but only abnormal results are displayed) Labs Reviewed  BASIC METABOLIC PANEL - Abnormal; Notable for the following components:      Result Value   Potassium 3.4 (*)    Glucose, Bld 106 (*)    BUN 32 (*)    All other components within normal limits  CBC - Abnormal; Notable for the following components:   RBC 3.79 (*)    All other components within normal limits  TROPONIN I (HIGH SENSITIVITY)  TROPONIN I (HIGH SENSITIVITY)    EKG None  Radiology CT Head Wo Contrast Result Date: 10/23/2023 CLINICAL DATA:  Jaw pain with mastication paralysis. EXAM: CT HEAD WITHOUT CONTRAST CT MAXILLOFACIAL WITHOUT CONTRAST TECHNIQUE: Multidetector CT imaging of the head and maxillofacial structures were performed using the standard protocol without intravenous contrast. Multiplanar CT image reconstructions of the maxillofacial structures were also generated. RADIATION DOSE REDUCTION: This exam was performed according to the departmental dose-optimization program which includes automated exposure control, adjustment of the mA  and/or kV according to patient size and/or use of iterative reconstruction technique. COMPARISON:  None Available. FINDINGS: CT HEAD FINDINGS Brain: There is no mass, hemorrhage or extra-axial collection. The size and configuration of the ventricles and extra-axial CSF spaces are normal. There is hypoattenuation of the white matter, most commonly indicating chronic small vessel disease. Vascular: No hyperdense vessel or unexpected vascular calcification. Skull: The visualized skull base, calvarium and extracranial soft tissues are normal. Sinuses/Orbits: No fluid levels or advanced mucosal thickening of the visualized paranasal sinuses. No mastoid or middle ear effusion. Normal orbits. Other: None. CT MAXILLOFACIAL FINDINGS Osseous: No facial fracture or mandibular dislocation. Torus palatini incidentally noted. Normal appearance of the temporomandibular joints. Orbits: The globes and optic nerves are intact. Normal extraocular muscles and intraorbital fat. Sinuses: No acute finding. Soft tissues: Normal visualized extracranial soft  tissues. IMPRESSION: 1. No acute intracranial abnormality. 2. Chronic small vessel disease. 3. No facial fracture or mandibular dislocation. Electronically Signed   By: Deatra Robinson M.D.   On: 10/23/2023 16:55   CT Maxillofacial W Contrast Result Date: 10/23/2023 CLINICAL DATA:  Jaw pain with mastication paralysis. EXAM: CT HEAD WITHOUT CONTRAST CT MAXILLOFACIAL WITHOUT CONTRAST TECHNIQUE: Multidetector CT imaging of the head and maxillofacial structures were performed using the standard protocol without intravenous contrast. Multiplanar CT image reconstructions of the maxillofacial structures were also generated. RADIATION DOSE REDUCTION: This exam was performed according to the departmental dose-optimization program which includes automated exposure control, adjustment of the mA and/or kV according to patient size and/or use of iterative reconstruction technique. COMPARISON:  None  Available. FINDINGS: CT HEAD FINDINGS Brain: There is no mass, hemorrhage or extra-axial collection. The size and configuration of the ventricles and extra-axial CSF spaces are normal. There is hypoattenuation of the white matter, most commonly indicating chronic small vessel disease. Vascular: No hyperdense vessel or unexpected vascular calcification. Skull: The visualized skull base, calvarium and extracranial soft tissues are normal. Sinuses/Orbits: No fluid levels or advanced mucosal thickening of the visualized paranasal sinuses. No mastoid or middle ear effusion. Normal orbits. Other: None. CT MAXILLOFACIAL FINDINGS Osseous: No facial fracture or mandibular dislocation. Torus palatini incidentally noted. Normal appearance of the temporomandibular joints. Orbits: The globes and optic nerves are intact. Normal extraocular muscles and intraorbital fat. Sinuses: No acute finding. Soft tissues: Normal visualized extracranial soft tissues. IMPRESSION: 1. No acute intracranial abnormality. 2. Chronic small vessel disease. 3. No facial fracture or mandibular dislocation. Electronically Signed   By: Deatra Robinson M.D.   On: 10/23/2023 16:55    Procedures Procedures    Medications Ordered in ED Medications  iohexol (OMNIPAQUE) 300 MG/ML solution 75 mL (75 mLs Intravenous Contrast Given 10/23/23 1612)    ED Course/ Medical Decision Making/ A&P                                 Medical Decision Making 73 year old female with past medical history of hypertension, hyperlipidemia, and lung cancer with metastases to the right femur presents the emergency department today with jaw pain and pain in her left side of her face.  The patient denies any headache with this.  She does not have any appreciable facial asymmetry here today.  She did see her doctor earlier and was started on Augmentin but was sent here for further evaluation.  I will further evaluate her here with basic labs well as an EKG and troponin to  eval for atypical ACS given the jaw pain.  Will also obtain a CT scan of her maxillofacial soft tissues and CT scan of her head to evaluate for intracranial hemorrhage or mass lesion.  I will reevaluate for ultimate disposition.  With no headache and with her having pain mostly in the left side of her face suspicion for temporal arteritis is low at this time.  The patient CT scans are unremarkable.  She is feeling better on reassessment.  Repeat neuroexam shows no focal deficits.  She will be discharged with return precautions.  She has been prescribed Augmentin is encouraged to start this and to call her oncologist for reevaluation.  Amount and/or Complexity of Data Reviewed Labs: ordered. Radiology: ordered.  Risk Prescription drug management.           Final Clinical Impression(s) / ED Diagnoses Final diagnoses:  Right facial pain    Rx / DC Orders ED Discharge Orders     None         Durwin Glaze, MD 10/23/23 [phone]

## 2023-10-23 NOTE — ED Triage Notes (Addendum)
 6 days ago woke up in the middle of the night with hypertension. Cardiologist told to double dose of nifedipine 4 days ago. Started having pain to left upper jaw 3 days ago intermittently. Taking ibuprofen, sudafed. Numbness intermittently to bottom lip and tongue feels "thick" x 2 days. States hx of mini stroke, cluster headaches.   States having facial droop and slurring speech when pain/numbness occurs. No droop in triage. Taking oral chemo for lung cancer.

## 2023-10-23 NOTE — Discharge Instructions (Signed)
 Please take the Augmentin as prescribed.  Please call and schedule a follow-up appointment with your oncologist to see if they have like to do any further workup regarding your abnormal PET scan as an outpatient.  Your CT scans today did not show anything concerning.  Please return to the ER for reevaluation for worsening symptoms.

## 2023-10-23 NOTE — Progress Notes (Signed)
 Subjective:     Patient ID: Laura Bush, female    DOB: 05/01/1951, 73 y.o.   MRN: [id number]  Chief Complaint  Patient presents with   Facial Pain    Patient complains of pain on left side of pain, s    HPI  Discussed the use of AI scribe software for clinical note transcription with the patient, who gave verbal consent to proceed.  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  She has been experiencing facial tenderness and swelling since last Thursday, initially presenting as mild tenderness on the left cheek. By Friday, the symptoms worsened, and over the weekend, the pain became intense. The pain starts at the nasal area, radiates around the cheek, and involves the canine tooth. She attempted to contact her dentist but did not receive a response. No fever or general malaise, but she has postnasal drip and a huskier voice, which she attributes to allergies. She took Sudafed last night, which she believes may have contributed to her elevated blood pressure today.  In addition to the facial symptoms, she has been experiencing high blood pressure and edema in both feet. She attributes these symptoms to her cancer medication Maeola Sarah), which was recently changed due to kidney effects from the previous medication. Her blood pressure medication, nifedipine, was increased last week to double the previous dose. She is scheduled to see her oncologist for lab work to assess her adaptation to the new medication.  She has a history of a sinus infection on the same side in January, diagnosed by Dr. Carmelia Roller, who also noted asthma at that time.     Health Maintenance Due  Topic Date Due   Medicare Annual Wellness (AWV)  10/25/2023    Past Medical History:  Diagnosis Date   Allergy    seasonal   Anemia associated with stage 3 chronic renal failure  (HCC) 04/18/2023   Arthritis Last several years   Mostly in hands, wrists, shoulders, knees   Asthma    in cold weather   Breast cancer (HCC) 09/18/2014   left breast   Breast cancer (HCC) 10/07/2014   bx left breast   Breast cancer of upper-outer quadrant of left female breast (HCC) 09/22/2014   Cluster headaches    in her 40's   GERD (gastroesophageal reflux disease)    Goals of care, counseling/discussion 01/14/2022   Heart murmur    as a child (has outgrown)   High cholesterol    History of radiation therapy    Left lung, Right Femur- 02/16/22-03/04/22- Dr. 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"   Stroke [facility]) summer 2022   described as tiny   Swallowing difficulty    Venous insufficiency     Past Surgical History:  Procedure Laterality Date  ABDOMINAL HYSTERECTOMY  1999   APPENDECTOMY  ~ 2006   BONE BIOPSY Right 01/04/2022   Procedure: RIGHT FEMORAL BONE BIOPSY;  Surgeon: Myrene Galas, MD;  Location: [facility] OR;  Service: Orthopedics;  Laterality: Right;   BONE EXCISION Right 04/11/2023   Procedure: BONE EXCISION;  Surgeon: Myrene Galas, MD;  Location: [facility] OR;  Service: Orthopedics;  Laterality: Right;   BREAST BIOPSY Left 10/2014   BREAST LUMPECTOMY Left 2016   BREAST REDUCTION SURGERY Bilateral 11/11/2014   Procedure: Bilateral Breast Reduction;  Surgeon: Etter Sjogren, MD;  Location: [facility] OR;  Service: Plastics;  Laterality: Bilateral;   BRONCHIAL BIOPSY  01/06/2022   Procedure: BRONCHIAL BIOPSIES;   Surgeon: Leslye Peer, MD;  Location: [facility] ENDOSCOPY;  Service: Pulmonary;;   BRONCHIAL BRUSHINGS  01/06/2022   Procedure: BRONCHIAL BRUSHINGS;  Surgeon: Leslye Peer, MD;  Location: [facility] ENDOSCOPY;  Service: Pulmonary;;   BRONCHIAL NEEDLE ASPIRATION BIOPSY  01/06/2022   Procedure: BRONCHIAL NEEDLE ASPIRATION BIOPSIES;  Surgeon: Leslye Peer, MD;  Location: [facility] ENDOSCOPY;  Service: Pulmonary;;   BRONCHIAL WASHINGS  01/06/2022   Procedure: BRONCHIAL WASHINGS;  Surgeon: Leslye Peer, MD;  Location: [facility] ENDOSCOPY;  Service: Pulmonary;;   CARPAL TUNNEL RELEASE Bilateral    2 surgeries on right, 1 on left   CESAREAN SECTION  1978; 1980   COLONOSCOPY     FEMUR IM NAIL Right 01/04/2022   Procedure: RIGHT FEMORAL INTRAMEDULLARY (IM) NAIL;  Surgeon: Myrene Galas, MD;  Location: [facility] OR;  Service: Orthopedics;  Laterality: Right;   FIDUCIAL MARKER PLACEMENT  01/06/2022   Procedure: FIDUCIAL MARKER PLACEMENT;  Surgeon: Leslye Peer, MD;  Location: [facility] ENDOSCOPY;  Service: Pulmonary;;   FOREARM FRACTURE SURGERY Right 2003 X 3   FRACTURE SURGERY  2003, 2023   3 surgeries, right arm -03; Femur-23   HARDWARE REMOVAL Right 04/11/2023   Procedure: HARDWARE REMOVAL;  Surgeon: Myrene Galas, MD;  Location: [facility] OR;  Service: Orthopedics;  Laterality: Right;   KNEE ARTHROSCOPY Bilateral    meniscus repair   PARTIAL MASTECTOMY WITH NEEDLE LOCALIZATION AND AXILLARY SENTINEL LYMPH NODE BX Left 11/11/2014   Procedure: LEFT BREAST PARTIAL MASTECTOMY WITH NEEDLE LOCALIZATION TIMES TWO AND LEFT AXILLARY SENTINEL LYMPH NODE Biopsy;  Surgeon: Claud Kelp, MD;  Location: [facility] OR;  Service: General;  Laterality: Left;   REDUCTION MAMMAPLASTY Bilateral 2016   SQUAMOUS CELL CARCINOMA EXCISION  1980's X 1   "nose"   TONSILLECTOMY  ~ 1957   TUBAL LIGATION  ?1984    Family History  Problem Relation Age of Onset   Dementia Mother        Lives in [place]   Hypertension Mother    Hyperlipidemia Mother    Stroke  Father        deceased   Heart failure Father 99   Hypertension Father    Uterine cancer Sister 34   Heart attack Maternal Grandmother    Hypertension Maternal Grandfather    Stroke Paternal Grandfather    Colon cancer Neg Hx    Esophageal cancer Neg Hx    Pancreatic cancer Neg Hx    Stomach cancer Neg Hx    Liver disease Neg Hx    Sleep apnea Neg Hx     Social History   Socioeconomic History   Marital status: Married    Spouse name: Randy Bush   Number of children: Not on file   Years of education: Not on file   Highest education level: Bachelor's degree (e.g.,  BA, AB, BS)  Occupational History   Occupation: retired  Tobacco Use   Smoking status: Never   Smokeless tobacco: Never  Vaping Use   Vaping status: Never Used  Substance and Sexual Activity   Alcohol use: Yes    Alcohol/week: 2.0 standard drinks of alcohol    Types: 2 Glasses of wine per week    Comment: 2 glasses a week   Drug use: No   Sexual activity: Not Currently  Other Topics Concern   Not on file  Social History Narrative   From [place].   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   Social Drivers of Corporate investment banker Strain: Low Risk  (09/08/2023)   Overall Financial Resource Strain (CARDIA)    Difficulty of Paying Living Expenses: Not hard at all  Food Insecurity: No Food Insecurity (09/08/2023)   Hunger Vital Sign    Worried About Running Out of Food in the Last Year: Never true    Ran Out of Food in the Last Year: Never true  Transportation Needs: No Transportation Needs (09/08/2023)   PRAPARE - Administrator, Civil Service (Medical): No    Lack of Transportation (Non-Medical): No  Physical Activity: Unknown (09/08/2023)   Exercise Vital Sign    Days of Exercise per Week: 0 days    Minutes of Exercise per Session: Not on file   Stress: No Stress Concern Present (09/08/2023)   [employer] of Occupational Health - Occupational Stress Questionnaire    Feeling of Stress : Not at all  Social Connections: Socially Integrated (09/08/2023)   Social Connection and Isolation Panel [NHANES]    Frequency of Communication with Friends and Family: More than three times a week    Frequency of Social Gatherings with Friends and Family: Three times a week    Attends Religious Services: More than 4 times per year    Active Member of Clubs or Organizations: Yes    Attends Banker Meetings: More than 4 times per year    Marital Status: Married  Catering manager Violence: Not At Risk (10/25/2022)   Humiliation, Afraid, Rape, and Kick questionnaire    Fear of Current or Ex-Partner: No    Emotionally Abused: No    Physically Abused: No    Sexually Abused: No    Outpatient Medications Prior to Visit  Medication Sig Dispense Refill   acetaminophen (TYLENOL) 500 MG tablet Take 1 tablet (500 mg total) by mouth every 8 (eight) hours as needed for mild pain or moderate pain. 30 tablet 0   Albuterol-Budesonide 90-80 MCG/ACT AERO Inhale 2 puffs into the lungs every 6 (six) hours as needed (wheezing, sob). 10.7 g 1   aspirin EC 81 MG tablet Take 81 mg by mouth in the morning. Swallow whole.     B Complex-C (SUPER B COMPLEX PO) Take 1 tablet by mouth daily with breakfast.     Calcium Carb-Cholecalciferol (CALCIUM + D3 PO) Take 1 tablet by mouth in the morning.     cholecalciferol (VITAMIN D3) 25 MCG (1000 UNIT) tablet Take 1,000 Units by mouth in the morning.     COLLAGEN PO Take 3 tablets by mouth in the morning and at bedtime. With Biotin     Denosumab (XGEVA [place]) Inject into the skin every 3 (three) months.  Evolocumab (REPATHA SURECLICK) 140 MG/ML SOAJ Inject 140 mg into the skin every 14 (fourteen) days. 6 mL 3   fexofenadine (ALLEGRA) 180 MG tablet Take 180 mg by mouth in the morning.     folic acid (FOLATE) 400 MCG  tablet Take 400 mcg by mouth in the morning.     furosemide (LASIX) 20 MG tablet Take Lasix two tablets (20 mg each) once a day 60 tablet 4   ibuprofen (ADVIL) 200 MG tablet Take 600 mg by mouth daily as needed.     ipratropium (ATROVENT) 0.06 % nasal spray Place 2 sprays into both nostrils 4 (four) times daily as needed for rhinitis. 15 mL 4   lorlatinib (LORBRENA) 100 MG tablet Take 1 tablet (100 mg total) by mouth daily. Swallow tablets whole. Do not chew, crush or split tablets. 30 tablet 0   metFORMIN (GLUCOPHAGE) 500 MG tablet TAKE TWO TABELTS AT LUNCH AND ONE TABLET AT DINNER 270 tablet 0   NIFEdipine (PROCARDIA XL/NIFEDICAL XL) 60 MG 24 hr tablet Take 1 tablet (60 mg total) by mouth daily. 90 tablet 1   pantoprazole (PROTONIX) 40 MG tablet Take 1 tablet (40 mg total) by mouth daily. 90 tablet 0   spironolactone (ALDACTONE) 25 MG tablet Take 1 tablet (25 mg total) by mouth daily. 90 tablet 3   traZODone (DESYREL) 50 MG tablet TAKE 1 TABLET BY MOUTH EVERY NIGHT AT BEDTIME AS NEEDED FOR SLEEP (Patient taking differently: Take 50 mg by mouth at bedtime. for sleep) 90 tablet 2   No facility-administered medications prior to visit.    Allergies  Allergen Reactions   Diclofenac Hypertension   Amlodipine Swelling and Other (See Comments)    Limbs swell, not the throat   Lanolin Other (See Comments)    Sneezing and watery eyes. Allergic to [facility].   Tape Other (See Comments)    Redness (Bandaids also)    ROS See HPI    Objective:    Physical Exam Constitutional:      General: She is not in acute distress.    Appearance: Normal appearance. She is well-developed.  HENT:     Head: Normocephalic and atraumatic.     Right Ear: Tympanic membrane, ear canal and external ear normal.     Left Ear: Tympanic membrane, ear canal and external ear normal.     Nose:     Right Sinus: No maxillary sinus tenderness or frontal sinus tenderness.     Left Sinus: Maxillary sinus tenderness and frontal  sinus tenderness (very mild) present.     Comments: Mild swelling of left cheek    Mouth/Throat:     Mouth: Mucous membranes are moist.     Pharynx: Oropharynx is clear. Uvula midline.  Eyes:     General: No scleral icterus. Neck:     Thyroid: No thyromegaly.  Cardiovascular:     Rate and Rhythm: Normal rate and regular rhythm.     Heart sounds: Normal heart sounds. No murmur heard. Pulmonary:     Effort: Pulmonary effort is normal. No respiratory distress.     Breath sounds: Normal breath sounds. No wheezing.  Musculoskeletal:     Cervical back: Neck supple.     Right lower leg: 3+ Edema present.     Left lower leg: 3+ Edema present.  Lymphadenopathy:     Cervical: No cervical adenopathy.  Skin:    General: Skin is warm and dry.  Neurological:     Mental Status: She is alert and  oriented to person, place, and time.  Psychiatric:        Mood and Affect: Mood normal.        Behavior: Behavior normal.        Thought Content: Thought content normal.        Judgment: Judgment normal.      BP (!) 150/72   Pulse 71   Temp 98.2 F (36.8 C)   Resp 18   Ht 4\' 11"  (1.499 m)   Wt 194 lb (88 kg)   SpO2 97%   BMI 39.18 kg/m  Wt Readings from Last 3 Encounters:  10/23/23 194 lb (88 kg)  09/22/23 190 lb 1.9 oz (86.2 kg)  09/13/23 191 lb 12.8 oz (87 kg)       Assessment & Plan:   Problem List Items Addressed This Visit       Unprioritized   Primary hypertension   BP mildly elevated today- but pt took sudafed last night. Advised pt to stop sudafed.  Continue to follow with the [facility] (Dr. Duke Salvia).      Lower extremity edema   Reports recent increase in nifedipine dose which could be a contributor to her edema.   She is also on [place] per Oncology which may be contributing.  She plans to address this concern at her next appointment with oncology.       Facial pain - Primary   New. Facial pain and swelling likely due to sinusitis or dental abscess. No  fever or significant nasal congestion, but postnasal drip is present. Treatment with Augmentin expected to address both conditions. - Prescribe Augmentin (amoxicillin/clavulanate) 1 tablet twice a day for 7 days. - Recommend acetaminophen as needed for pain. - Advise dental visit if symptoms do not improve. - Instruct to report fever, increased pain or swelling.        I am having Laura W. Bush "[name]" start on amoxicillin-clavulanate. I am also having her maintain her aspirin EC, B Complex-C (SUPER B COMPLEX PO), fexofenadine, Calcium Carb-Cholecalciferol (CALCIUM + D3 PO), acetaminophen, cholecalciferol, folic acid, ipratropium, metFORMIN, traZODone, Denosumab (XGEVA [place]), COLLAGEN PO, ibuprofen, spironolactone, Repatha SureClick, furosemide, pantoprazole, Albuterol-Budesonide, lorlatinib, and NIFEdipine.  Meds ordered this encounter  Medications   amoxicillin-clavulanate (AUGMENTIN) 875-125 MG tablet    Sig: Take 1 tablet by mouth 2 (two) times daily.    Dispense:  14 tablet    Refill:  0    Supervising Provider:   Danise Edge A [[id number]]

## 2023-10-23 NOTE — Telephone Encounter (Signed)
 Copied from CRM [id number]. Topic: Clinical - Red Word Triage >> Oct 23, 2023 10:16 AM [place] J wrote: [facility] that prompted transfer to Nurse Triage: Severe face/tooth pain.   Chief Complaint: Left sided dental pain  Symptoms: Left sided dental pain, left sided facial swelling  Frequency: Intermittent pain  Pertinent Negatives: Patient denies fever  Disposition: [] ED /[] Urgent Care (no appt availability in office) / [x] Appointment(In office/virtual)/ []  [facility]/ [] Home Care/ [] Refused Recommended Disposition /[] [place] [facility]/ []  Follow-up with PCP Additional Notes: Patient reports that she began to experience left sided dental pain 4 days ago. She states that the pain is in her upper left jaw, is intermittent, and will radiate to her lower jaw and to her sinuses. She states that she is also experiencing some swelling to her left sided jaw. She denies any fever. Appointment made for today in the office.    Reason for Disposition  [1] Face is swollen AND [2] no fever  Answer Assessment - Initial Assessment Questions 1. LOCATION: "Which tooth is hurting?"  (e.g., right-side/left-side, upper/lower, front/back)     Left sided jaw, mostly upper but sometimes lower 2. ONSET: "When did the toothache start?"  (e.g., hours, days)      4 days ago, worsening  3. SEVERITY: "How bad is the toothache?"  (Scale 1-10; mild, moderate or severe)   - MILD (1-3): doesn't interfere with chewing    - MODERATE (4-7): interferes with chewing, interferes with normal activities, awakens from sleep     - SEVERE (8-10): unable to eat, unable to do any normal activities, excruciating pain        Moderate to severe  4. SWELLING: "Is there any visible swelling of your face?"     Slight swelling on the left side  5. OTHER SYMPTOMS: "Do you have any other symptoms?" (e.g., fever)     No  Protocols used: Toothache-A-AH

## 2023-10-23 NOTE — Patient Instructions (Addendum)
 VISIT SUMMARY:  Today, we discussed your facial tenderness and swelling, high blood pressure, and foot edema. We reviewed your symptoms and made a plan to address each issue. You were prescribed medication for your facial pain and swelling, and we discussed the potential causes and next steps. We also reviewed your blood pressure and edema, considering your recent changes in cancer medication.  YOUR PLAN:  -FACIAL PAIN AND SWELLING: Your facial pain and swelling are likely due to either a sinus infection or a dental abscess. We have prescribed Augmentin (amoxicillin/clavulanate) to treat the infection. Please take one tablet twice a day for 7 days. You can also take acetaminophen as needed for pain relief. If your symptoms do not improve, please visit your dentist. Report any fever, increased swelling, or other concerning symptoms immediately.  -HYPERTENSION: Your recent high blood pressure episodes may be related to the use of Sudafed. Your blood pressure medication, nifedipine, was increased last week. We will continue to monitor your blood pressure closely.  -EDEMA: The swelling in your feet may be related to the recent changes in your cancer medication, which can affect kidney function. You are scheduled to see your oncologist for lab work to assess how you are adapting to the new medication.   INSTRUCTIONS:  Please follow up with your oncologist as scheduled for lab work to assess your adaptation to the new cancer medication. If your facial pain and swelling do not improve, visit your dentist. Report any fever, increased swelling, or other concerning symptoms immediately.

## 2023-10-25 ENCOUNTER — Other Ambulatory Visit: Payer: Self-pay

## 2023-10-25 ENCOUNTER — Telehealth: Payer: Self-pay | Admitting: Family Medicine

## 2023-10-25 ENCOUNTER — Emergency Department (HOSPITAL_COMMUNITY)
Admission: EM | Admit: 2023-10-25 | Discharge: 2023-10-26 | Disposition: A | Discharge status: Home / Self Care | Attending: Emergency Medicine | Admitting: Emergency Medicine

## 2023-10-25 ENCOUNTER — Other Ambulatory Visit: Payer: Self-pay | Admitting: Hematology & Oncology

## 2023-10-25 ENCOUNTER — Telehealth: Payer: Self-pay

## 2023-10-25 ENCOUNTER — Ambulatory Visit (HOSPITAL_BASED_OUTPATIENT_CLINIC_OR_DEPARTMENT_OTHER)
Admission: RE | Admit: 2023-10-25 | Discharge: 2023-10-25 | Discharge status: Home / Self Care | Source: Ambulatory Visit | Attending: Hematology & Oncology | Admitting: Hematology & Oncology

## 2023-10-25 DIAGNOSIS — R220 Localized swelling, mass and lump, head: Secondary | ICD-10-CM | POA: Diagnosis not present

## 2023-10-25 DIAGNOSIS — Z170 Estrogen receptor positive status [ER+]: Secondary | ICD-10-CM

## 2023-10-25 DIAGNOSIS — L03211 Cellulitis of face: Secondary | ICD-10-CM | POA: Diagnosis not present

## 2023-10-25 DIAGNOSIS — Z85118 Personal history of other malignant neoplasm of bronchus and lung: Secondary | ICD-10-CM | POA: Insufficient documentation

## 2023-10-25 DIAGNOSIS — J329 Chronic sinusitis, unspecified: Secondary | ICD-10-CM | POA: Diagnosis not present

## 2023-10-25 DIAGNOSIS — Z7984 Long term (current) use of oral hypoglycemic drugs: Secondary | ICD-10-CM | POA: Diagnosis not present

## 2023-10-25 DIAGNOSIS — Z7982 Long term (current) use of aspirin: Secondary | ICD-10-CM | POA: Diagnosis not present

## 2023-10-25 DIAGNOSIS — C50812 Malignant neoplasm of overlapping sites of left female breast: Secondary | ICD-10-CM | POA: Insufficient documentation

## 2023-10-25 DIAGNOSIS — C7951 Secondary malignant neoplasm of bone: Secondary | ICD-10-CM

## 2023-10-25 DIAGNOSIS — C3490 Malignant neoplasm of unspecified part of unspecified bronchus or lung: Secondary | ICD-10-CM

## 2023-10-25 DIAGNOSIS — M25451 Effusion, right hip: Secondary | ICD-10-CM | POA: Diagnosis not present

## 2023-10-25 MED ORDER — LORLATINIB 100 MG PO TABS
100.0000 mg | ORAL_TABLET | Freq: Every day | ORAL | 0 refills | Status: DC
  Filled 2023-10-25: qty 30, 30d supply, fill #0

## 2023-10-25 MED ORDER — GADOBUTROL 1 MMOL/ML IV SOLN
10.0000 mL | Freq: Once | INTRAVENOUS | Status: AC | PRN
  Administered 2023-10-25: 10 mL via INTRAVENOUS

## 2023-10-25 NOTE — Telephone Encounter (Signed)
 Lvm 2 sch.

## 2023-10-25 NOTE — Progress Notes (Signed)
 Specialty Pharmacy Refill Coordination Note  Laura Bush is a 73 y.o. female contacted today regarding refills of specialty medication(s) Lorlatinib Laura Bush)   Patient requested (Patient-Rptd) Delivery   Delivery date: (Patient-Rptd) 11/06/23   Verified address: (Patient-Rptd) [address] [place], [address]   Medication will be filled on 03.28.25.   This fill date is pending response to refill request from provider. Patient is aware and if they have not received fill by intended date they must follow up with pharmacy.

## 2023-10-25 NOTE — Telephone Encounter (Signed)
 Okay to schedule on pts return.

## 2023-10-25 NOTE — Transitions of Care (Post Inpatient/ED Visit) (Signed)
 10/25/2023  Name: Laura Bush MRN: [id number] DOB: 07-27-1951  Today's TOC FU Call Status: Today's TOC FU Call Status:: Successful TOC FU Call Completed TOC FU Call Complete Date: 10/25/23 Patient's Name and Date of Birth confirmed.  Transition Care Management Follow-up Telephone Call Date of Discharge: 10/24/23 Discharge Facility: [facility] Type of Discharge: Emergency Department Reason for ED Visit: Other: (headache) How have you been since you were released from the hospital?: Better Any questions or concerns?: No  Items Reviewed: Did you receive and understand the discharge instructions provided?: Yes Medications obtained,verified, and reconciled?: Yes (Medications Reviewed) Any new allergies since your discharge?: No Dietary orders reviewed?: Yes Do you have support at home?: Yes People in Home: spouse  Medications Reviewed Today: Medications Reviewed Today     Reviewed by Karena Addison, LPN (Licensed Practical Nurse) on 10/25/23 at 1143  Med List Status: <None>   Medication Order Taking? Sig Documenting Provider Last Dose Status Informant  acetaminophen (TYLENOL) 500 MG tablet [id number] No Take 1 tablet (500 mg total) by mouth every 8 (eight) hours as needed for mild pain or moderate pain. Montez Morita, PA-C Taking Active Self  Albuterol-Budesonide 90-80 MCG/ACT AERO [id number] No Inhale 2 puffs into the lungs every 6 (six) hours as needed (wheezing, sob). Sharlene Dory, DO Taking Active   amoxicillin-clavulanate (AUGMENTIN) 875-125 MG tablet [id number]  Take 1 tablet by mouth 2 (two) times daily. Sandford Craze, NP  Active   aspirin EC 81 MG tablet [id number] No Take 81 mg by mouth in the morning. Swallow whole. [provider] Taking Active Self  B Complex-C (SUPER B COMPLEX PO) [id number] No Take 1 tablet by mouth daily with breakfast. [provider] Taking Active Self  Calcium Carb-Cholecalciferol (CALCIUM + D3 PO)  [id number] No Take 1 tablet by mouth in the morning. [provider] Taking Active Self  cholecalciferol (VITAMIN D3) 25 MCG (1000 UNIT) tablet [id number] No Take 1,000 Units by mouth in the morning. [provider] Taking Active Self  COLLAGEN PO [id number] No Take 3 tablets by mouth in the morning and at bedtime. With Biotin [provider] Taking Active Self  Denosumab (XGEVA ) [id number] No Inject into the skin every 3 (three) months. [provider] Taking Active Self  Evolocumab (REPATHA SURECLICK) 140 MG/ML SOAJ [id number] No Inject 140 mg into the skin every 14 (fourteen) days. Chilton Si, MD Taking Active   fexofenadine [facility]) 180 MG tablet [id number] No Take 180 mg by mouth in the morning. [provider] Taking Active Self  folic acid (FOLATE) 400 MCG tablet [id number] No Take 400 mcg by mouth in the morning. [provider] Taking Active Self  furosemide (LASIX) 20 MG tablet [id number] No Take Lasix two tablets (20 mg each) once a day Josph Macho, MD Taking Active   ibuprofen (ADVIL) 200 MG tablet [id number] No Take 600 mg by mouth daily as needed. [provider] Taking Active   ipratropium (ATROVENT) 0.06 % nasal spray [id number] No Place 2 sprays into both nostrils 4 (four) times daily as needed for rhinitis. Copland, Gwenlyn Found, MD Taking Active Self  lorlatinib (LORBRENA) 100 MG tablet [id number]  Take 1 tablet (100 mg total) by mouth daily. Swallow tablets whole. Do not chew, crush or split tablets. Josph Macho, MD  Active   metFORMIN (GLUCOPHAGE) 500 MG tablet [id number] No TAKE TWO TABELTS AT LUNCH AND ONE TABLET AT Hassie Bruce, [place], [place] Taking Active Self  Med Note Lacie Draft   Fri May 26, 2023  1:09 PM) 05/26/2023 Takes 1000 mg daily only.  NIFEdipine (PROCARDIA XL/NIFEDICAL XL) 60 MG 24 hr tablet [id number]  Take 1 tablet (60 mg total) by mouth daily. Chilton Si, MD  Active    pantoprazole (PROTONIX) 40 MG tablet [id number] No Take 1 tablet (40 mg total) by mouth daily. Copland, Gwenlyn Found, MD Taking Active   spironolactone (ALDACTONE) 25 MG tablet [id number] No Take 1 tablet (25 mg total) by mouth daily. Chilton Si, MD Taking Active   traZODone (DESYREL) 50 MG tablet [id number] No TAKE 1 TABLET BY MOUTH EVERY NIGHT AT BEDTIME AS NEEDED FOR SLEEP  Patient taking differently: Take 50 mg by mouth at bedtime. for sleep   Josph Macho, MD Taking Active Self  Med List Note Brunetta Jeans, [facility] 06/12/23 [id number]): [name] filled through [facility] and Equipment/Supplies: Were Home Health Services Ordered?: NA Any new equipment or medical supplies ordered?: NA  Functional Questionnaire: Do you need assistance with bathing/showering or dressing?: No Do you need assistance with meal preparation?: No Do you need assistance with eating?: No Do you have difficulty maintaining continence: No Do you need assistance with getting out of bed/getting out of a chair/moving?: No Do you have difficulty managing or taking your medications?: No  Follow up appointments reviewed: PCP Follow-up appointment confirmed?: No (will call back to schedule) MD Provider Line [phone] Given: No Specialist Hospital Follow-up appointment confirmed?: NA Do you need transportation to your follow-up appointment?: No Do you understand care options if your condition(s) worsen?: Yes-patient verbalized understanding    SIGNATURE Karena Addison, LPN [facility] Nurse Health Advisor Direct Dial [phone]

## 2023-10-25 NOTE — Telephone Encounter (Signed)
 Copied from CRM [id number]. Topic: Appointments - Appointment Scheduling >> Oct 25, 2023  2:40 PM Laura Bush O wrote: Patient/patient representative is calling to schedule an appointment. Refer to attachments for appointment information. Patient is calling for a hospital follow up . I offered the appointments for next week but she said she will be out of town . So by time she comes back it will be past due the time range we are to schedule hospital follow ups. Then as we were talking patient had to go cause she had another appointment to go to. Please reach out to patient to get her scheduled for the hospital follow up .[phone]

## 2023-10-25 NOTE — ED Triage Notes (Addendum)
 Pt had a root canal completed today around noon today at dentist office. Begin swelling on her left side her face with redness starting around 9pm. Reports oral temp of 99.4 at home. Pt states dentist told them to come to ER if she had any of the s/s she is having at this time. Denies any other s/s at time of triage.   Pt is currently on chemo for cancer.

## 2023-10-25 NOTE — ED Notes (Signed)
 Provider called to see pt in triage.

## 2023-10-26 ENCOUNTER — Encounter: Payer: Self-pay | Admitting: Hematology & Oncology

## 2023-10-26 ENCOUNTER — Ambulatory Visit (INDEPENDENT_AMBULATORY_CARE_PROVIDER_SITE_OTHER): Admitting: Family Medicine

## 2023-10-26 ENCOUNTER — Telehealth: Payer: Self-pay | Admitting: Hematology & Oncology

## 2023-10-26 ENCOUNTER — Inpatient Hospital Stay (HOSPITAL_BASED_OUTPATIENT_CLINIC_OR_DEPARTMENT_OTHER): Payer: Medicare HMO | Admitting: Hematology & Oncology

## 2023-10-26 ENCOUNTER — Emergency Department (HOSPITAL_COMMUNITY)

## 2023-10-26 ENCOUNTER — Encounter (HOSPITAL_COMMUNITY): Payer: Self-pay

## 2023-10-26 ENCOUNTER — Telehealth: Payer: Self-pay

## 2023-10-26 ENCOUNTER — Ambulatory Visit (HOSPITAL_BASED_OUTPATIENT_CLINIC_OR_DEPARTMENT_OTHER)

## 2023-10-26 ENCOUNTER — Inpatient Hospital Stay: Payer: Medicare HMO | Attending: Hematology & Oncology

## 2023-10-26 VITALS — BP 151/74 | HR 75 | Temp 97.7°F | Resp 20 | Ht 59.0 in | Wt 193.1 lb

## 2023-10-26 VITALS — BP 122/82 | HR 83 | Temp 97.8°F | Resp 18 | Ht 59.0 in | Wt 193.4 lb

## 2023-10-26 DIAGNOSIS — J329 Chronic sinusitis, unspecified: Secondary | ICD-10-CM | POA: Diagnosis not present

## 2023-10-26 DIAGNOSIS — C3432 Malignant neoplasm of lower lobe, left bronchus or lung: Secondary | ICD-10-CM | POA: Insufficient documentation

## 2023-10-26 DIAGNOSIS — L03211 Cellulitis of face: Secondary | ICD-10-CM

## 2023-10-26 DIAGNOSIS — C3490 Malignant neoplasm of unspecified part of unspecified bronchus or lung: Secondary | ICD-10-CM | POA: Diagnosis not present

## 2023-10-26 DIAGNOSIS — C7951 Secondary malignant neoplasm of bone: Secondary | ICD-10-CM | POA: Insufficient documentation

## 2023-10-26 DIAGNOSIS — D509 Iron deficiency anemia, unspecified: Secondary | ICD-10-CM | POA: Insufficient documentation

## 2023-10-26 DIAGNOSIS — R220 Localized swelling, mass and lump, head: Secondary | ICD-10-CM | POA: Diagnosis not present

## 2023-10-26 LAB — CMP (CANCER CENTER ONLY)
ALT: 29 U/L (ref 0–44)
AST: 25 U/L (ref 15–41)
Albumin: 4.3 g/dL (ref 3.5–5.0)
Alkaline Phosphatase: 63 U/L (ref 38–126)
Anion gap: 9 (ref 5–15)
BUN: 17 mg/dL (ref 8–23)
CO2: 30 mmol/L (ref 22–32)
Calcium: 9.7 mg/dL (ref 8.9–10.3)
Chloride: 99 mmol/L (ref 98–111)
Creatinine: 0.92 mg/dL (ref 0.44–1.00)
GFR, Estimated: >60 mL/min (ref >60)
Glucose, Bld: 118 mg/dL — ABNORMAL HIGH (ref 70–99)
Potassium: 3.6 mmol/L (ref 3.5–5.1)
Sodium: 138 mmol/L (ref 135–145)
Total Bilirubin: 0.5 mg/dL (ref 0.0–1.2)
Total Protein: 7.5 g/dL (ref 6.5–8.1)

## 2023-10-26 LAB — CBC WITH DIFFERENTIAL (CANCER CENTER ONLY)
Abs Immature Granulocytes: 0.05 10*3/uL (ref 0.00–0.07)
Basophils Absolute: 0 10*3/uL (ref 0.0–0.1)
Basophils Relative: 0 %
Eosinophils Absolute: 0.1 10*3/uL (ref 0.0–0.5)
Eosinophils Relative: 1 %
HCT: 33.9 % — ABNORMAL LOW (ref 36.0–46.0)
Hemoglobin: 11.2 g/dL — ABNORMAL LOW (ref 12.0–15.0)
Immature Granulocytes: 1 %
Lymphocytes Relative: 12 %
Lymphs Abs: 0.9 10*3/uL (ref 0.7–4.0)
MCH: 32.9 pg (ref 26.0–34.0)
MCHC: 33 g/dL (ref 30.0–36.0)
MCV: 99.7 fL (ref 80.0–100.0)
Monocytes Absolute: 1.1 10*3/uL — ABNORMAL HIGH (ref 0.1–1.0)
Monocytes Relative: 15 %
Neutro Abs: 5.6 10*3/uL (ref 1.7–7.7)
Neutrophils Relative %: 71 %
Platelet Count: 171 10*3/uL (ref 150–400)
RBC: 3.4 MIL/uL — ABNORMAL LOW (ref 3.87–5.11)
RDW: 14.3 % (ref 11.5–15.5)
WBC Count: 7.8 10*3/uL (ref 4.0–10.5)
nRBC: 0 % (ref 0.0–0.2)

## 2023-10-26 LAB — COMPREHENSIVE METABOLIC PANEL
ALT: 22 U/L (ref 0–44)
AST: 37 U/L (ref 15–41)
Albumin: 3.5 g/dL (ref 3.5–5.0)
Alkaline Phosphatase: 37 U/L — ABNORMAL LOW (ref 38–126)
Anion gap: 9 (ref 5–15)
BUN: 16 mg/dL (ref 8–23)
CO2: 26 mmol/L (ref 22–32)
Calcium: 9.2 mg/dL (ref 8.9–10.3)
Chloride: 99 mmol/L (ref 98–111)
Creatinine, Ser: 0.97 mg/dL (ref 0.44–1.00)
GFR, Estimated: >60 mL/min (ref >60)
Glucose, Bld: 139 mg/dL — ABNORMAL HIGH (ref 70–99)
Potassium: 4.2 mmol/L (ref 3.5–5.1)
Sodium: 134 mmol/L — ABNORMAL LOW (ref 135–145)
Total Bilirubin: 1.3 mg/dL — ABNORMAL HIGH (ref 0.0–1.2)
Total Protein: 7.2 g/dL (ref 6.5–8.1)

## 2023-10-26 LAB — CBC WITH DIFFERENTIAL/PLATELET
Abs Immature Granulocytes: 0.03 10*3/uL (ref 0.00–0.07)
Basophils Absolute: 0 10*3/uL (ref 0.0–0.1)
Basophils Relative: 0 %
Eosinophils Absolute: 0.2 10*3/uL (ref 0.0–0.5)
Eosinophils Relative: 2 %
HCT: 34.6 % — ABNORMAL LOW (ref 36.0–46.0)
Hemoglobin: 11.7 g/dL — ABNORMAL LOW (ref 12.0–15.0)
Immature Granulocytes: 0 %
Lymphocytes Relative: 12 %
Lymphs Abs: 0.9 10*3/uL (ref 0.7–4.0)
MCH: 33.3 pg (ref 26.0–34.0)
MCHC: 33.8 g/dL (ref 30.0–36.0)
MCV: 98.6 fL (ref 80.0–100.0)
Monocytes Absolute: 1 10*3/uL (ref 0.1–1.0)
Monocytes Relative: 13 %
Neutro Abs: 5.4 10*3/uL (ref 1.7–7.7)
Neutrophils Relative %: 73 %
Platelets: 168 10*3/uL (ref 150–400)
RBC: 3.51 MIL/uL — ABNORMAL LOW (ref 3.87–5.11)
RDW: 14 % (ref 11.5–15.5)
WBC: 7.5 10*3/uL (ref 4.0–10.5)
nRBC: 0 % (ref 0.0–0.2)

## 2023-10-26 LAB — IRON AND IRON BINDING CAPACITY (CC-WL,HP ONLY)
Iron: 45 ug/dL (ref 28–170)
Saturation Ratios: 14 % (ref 10.4–31.8)
TIBC: 330 ug/dL (ref 250–450)
UIBC: 285 ug/dL (ref 148–442)

## 2023-10-26 LAB — RETICULOCYTES
Immature Retic Fract: 9.4 % (ref 2.3–15.9)
RBC.: 3.43 MIL/uL — ABNORMAL LOW (ref 3.87–5.11)
Retic Count, Absolute: 58 10*3/uL (ref 19.0–186.0)
Retic Ct Pct: 1.7 % (ref 0.4–3.1)

## 2023-10-26 LAB — FERRITIN: Ferritin: 135 ng/mL (ref 11–307)

## 2023-10-26 MED ORDER — CLINDAMYCIN PHOSPHATE 900 MG/50ML IV SOLN
900.0000 mg | Freq: Once | INTRAVENOUS | Status: AC
  Administered 2023-10-26: 900 mg via INTRAVENOUS
  Filled 2023-10-26: qty 50

## 2023-10-26 MED ORDER — CLINDAMYCIN HCL 150 MG PO CAPS: 300.0000 mg | ORAL_CAPSULE | Freq: Four times a day (QID) | ORAL | 0 refills | Status: AC

## 2023-10-26 MED ORDER — SODIUM CHLORIDE 0.9 % IV SOLN: 3.0000 g | Freq: Once | INTRAVENOUS | Status: DC

## 2023-10-26 MED ORDER — MORPHINE SULFATE (PF) 4 MG/ML IV SOLN
4.0000 mg | Freq: Once | INTRAVENOUS | Status: AC
  Administered 2023-10-26: 4 mg via INTRAVENOUS
  Filled 2023-10-26: qty 1

## 2023-10-26 MED ORDER — IOHEXOL 350 MG/ML SOLN
75.0000 mL | Freq: Once | INTRAVENOUS | Status: AC | PRN
  Administered 2023-10-26: 75 mL via INTRAVENOUS

## 2023-10-26 NOTE — Progress Notes (Signed)
 Hematology and Oncology Follow Up Visit  Laura Bush [id number] [birth year] 73 y.o. 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 3 months  -next dose in 11/2023   -- on hold due to maxillary infection S/P SBRT to LUL -- completed on 03/04/2022 S/P XRT to RIGHT femur -- completed on 03/04/2022 Alecensa 600 mg po BID -- start on 03/24/2022 --on hold starting 04/04/2023 -restart 300 mg p.o. twice daily on 05/04/2023 --DC on 05/26/2023 Lorbrena 100 mg po q day  -- start on 06/14/2023 Aranesp 300 mcg subcu every 3 weeks Venofer-given on 07/10/2023     Interim History:  [name] is back for follow-up.  Unfortunately, she really has been through quite a lot since we last saw her.  Their issue now is that she has cellulitis and an abscess in the maxilla.  This is on the right side.  She was having some pain.  She ultimately had a root canal.  Some purulent discharge was noted from the root canal.  She has been in the ER.  She actually had a CT of the face yesterday.  This did show soft tissue swelling in the left face involving the left masticator space.  No discrete abscess was noted.  She is on clindamycin right now.  I told her to make sure she takes a probiotic to help prevent C. difficile.  The next problem that we have is that with her PET scan that she had done back in February, this did show enlarging sclerotic lesion in the lower right femoral neck.  SUV was 4.91.  It was worrisome for the possibility of tumor progression.  Everything else looks very stable.  We did do a MRI of the right hip.  This was done on 10/25/2023.  This did show a progressive sclerotic lesion in the antrum to your junction of the right femoral neck and greater trochanter.  I will  have to speak with Dr. Carola Frost of Orthopedic Surgery and see what he has to say.  I will also to speak with Dr. Roselind Messier RADIATION Oncology to see what he has to say.  She is already had surgery to that area.  She is already had radiation to that area.  I told her to hold the [place] for about a week.  Thankfully, she has had no bleeding.  There is been no fever.  She has had no nausea or vomiting.  There is been no change in bowel or bladder habits.  Overall, I would have to say that her performance status is probably ECOG 2.   .    Medications:  Current Outpatient Medications:    acetaminophen (TYLENOL) 500 MG tablet, Take 1 tablet (500 mg total) by mouth every 8 (eight) hours as needed for mild pain or moderate pain., Disp: 30 tablet, Rfl: 0   aspirin EC 81 MG tablet, Take 81 mg by mouth in the morning. Swallow whole., Disp: , Rfl:    B Complex-C (SUPER B COMPLEX PO), Take 1 tablet by mouth daily with breakfast., Disp: , Rfl:    Calcium Carb-Cholecalciferol (CALCIUM + D3 PO), Take 1 tablet by mouth in the morning., Disp: , Rfl:    cholecalciferol (VITAMIN D3) 25 MCG (1000 UNIT) tablet, Take 1,000 Units by mouth in the morning.,  Disp: , Rfl:    clindamycin (CLEOCIN) 150 MG capsule, Take 2 capsules (300 mg total) by mouth 4 (four) times daily for 7 days., Disp: 56 capsule, Rfl: 0   COLLAGEN PO, Take 3 tablets by mouth in the morning and at bedtime. With Biotin, Disp: , Rfl:    Denosumab (XGEVA [place]), Inject into the skin every 3 (three) months., Disp: , Rfl:    Evolocumab (REPATHA SURECLICK) 140 MG/ML SOAJ, Inject 140 mg into the skin every 14 (fourteen) days., Disp: 6 mL, Rfl: 3   fexofenadine (ALLEGRA) 180 MG tablet, Take 180 mg by mouth in the morning., Disp: , Rfl:    folic acid (FOLATE) 400 MCG tablet, Take 400 mcg by mouth in the morning., Disp: , Rfl:    furosemide (LASIX) 20 MG tablet, Take Lasix two tablets (20 mg each) once a day, Disp: 60 tablet, Rfl: 4   ibuprofen (ADVIL) 200 MG  tablet, Take 600 mg by mouth daily as needed., Disp: , Rfl:    ipratropium (ATROVENT) 0.06 % nasal spray, Place 2 sprays into both nostrils 4 (four) times daily as needed for rhinitis., Disp: 15 mL, Rfl: 4   lorlatinib (LORBRENA) 100 MG tablet, Take 1 tablet (100 mg total) by mouth daily. Swallow tablets whole. Do not chew, crush or split tablets., Disp: 30 tablet, Rfl: 0   metFORMIN (GLUCOPHAGE) 500 MG tablet, TAKE TWO TABELTS AT LUNCH AND ONE TABLET AT DINNER, Disp: 270 tablet, Rfl: 0   NIFEdipine (PROCARDIA XL/NIFEDICAL XL) 60 MG 24 hr tablet, Take 1 tablet (60 mg total) by mouth daily., Disp: 90 tablet, Rfl: 1   pantoprazole (PROTONIX) 40 MG tablet, Take 1 tablet (40 mg total) by mouth daily., Disp: 90 tablet, Rfl: 0   spironolactone (ALDACTONE) 25 MG tablet, Take 1 tablet (25 mg total) by mouth daily., Disp: 90 tablet, Rfl: 3   traZODone (DESYREL) 50 MG tablet, TAKE 1 TABLET BY MOUTH EVERY NIGHT AT BEDTIME AS NEEDED FOR SLEEP (Patient taking differently: Take 50 mg by mouth at bedtime. for sleep), Disp: 90 tablet, Rfl: 2   valsartan (DIOVAN) 80 MG tablet, Take 80 mg by mouth daily., Disp: , Rfl:    Albuterol-Budesonide 90-80 MCG/ACT AERO, Inhale 2 puffs into the lungs every 6 (six) hours as needed (wheezing, sob). (Patient not taking: Reported on 10/26/2023), Disp: 10.7 g, Rfl: 1  Allergies:  Allergies  Allergen Reactions   Diclofenac Hypertension   Amlodipine Swelling and Other (See Comments)    Limbs swell, not the throat   Lanolin Other (See Comments)    Sneezing and watery eyes. Allergic to [facility].   Tape Other (See Comments)    Redness (Bandaids also)    Past Medical History, Surgical history, Social history, and Family History were reviewed and updated.  Review of Systems: Review of Systems  Constitutional: Negative.   HENT:  Negative.    Eyes: Negative.   Respiratory: Negative.    Cardiovascular: Negative.   Gastrointestinal: Negative.   Endocrine: Negative.    Genitourinary: Negative.    Musculoskeletal:  Positive for arthralgias.  Skin: Negative.   Neurological: Negative.   Hematological: Negative.   Psychiatric/Behavioral: Negative.      Physical Exam: Vital signs show temperature of 97.7.  Pulse 75.  Blood pressure 151/74.  Weight is 193 pounds.   Wt Readings from Last 3 Encounters:  10/26/23 193 lb 1.9 oz (87.6 kg)  10/25/23 190 lb (86.2 kg)  10/23/23 194 lb (88 kg)    Physical Exam  Vitals reviewed.  HENT:     Head: Normocephalic and atraumatic.  Eyes:     Pupils: Pupils are equal, round, and reactive to light.  Cardiovascular:     Rate and Rhythm: Normal rate and regular rhythm.     Heart sounds: Normal heart sounds.  Pulmonary:     Effort: Pulmonary effort is normal.     Breath sounds: Normal breath sounds.  Abdominal:     General: Bowel sounds are normal.     Palpations: Abdomen is soft.  Musculoskeletal:        General: No tenderness or deformity. Normal range of motion.     Cervical back: Normal range of motion.     Comments: She has about 2+ edema in the lower legs.  I cannot palpate any venous cord.  She has a negative Homans' sign.  Lymphadenopathy:     Cervical: No cervical adenopathy.  Skin:    General: Skin is warm and dry.     Findings: No erythema or rash.  Neurological:     Mental Status: She is alert and oriented to person, place, and time.  Psychiatric:        Behavior: Behavior normal.        Thought Content: Thought content normal.        Judgment: Judgment normal.      Lab Results  Component Value Date   WBC 7.8 10/26/2023   HGB 11.2 (L) 10/26/2023   HCT 33.9 (L) 10/26/2023   MCV 99.7 10/26/2023   PLT 171 10/26/2023     Chemistry      Component Value Date/Time   NA 134 (L) 10/25/2023 2358   NA 142 07/17/2023 1358   NA 142 05/29/2017 1002   K 4.2 10/25/2023 2358   K 3.9 05/29/2017 1002   CL 99 10/25/2023 2358   CO2 26 10/25/2023 2358   CO2 28 05/29/2017 1002   BUN 16 10/25/2023  2358   BUN 26 07/17/2023 1358   BUN 13.5 05/29/2017 1002   CREATININE 0.97 10/25/2023 2358   CREATININE 1.04 (H) 09/22/2023 0909   CREATININE 0.81 04/06/2020 1059   CREATININE 0.8 05/29/2017 1002      Component Value Date/Time   CALCIUM 9.2 10/25/2023 2358   CALCIUM 9.4 05/29/2017 1002   ALKPHOS 37 (L) 10/25/2023 2358   ALKPHOS 39 (L) 05/29/2017 1002   AST 37 10/25/2023 2358   AST 16 09/22/2023 0909   AST 18 05/29/2017 1002   ALT 22 10/25/2023 2358   ALT 19 09/22/2023 0909   ALT 19 05/29/2017 1002   BILITOT 1.3 (H) 10/25/2023 2358   BILITOT 0.3 09/22/2023 0909   BILITOT 0.48 05/29/2017 1002       Impression and Plan: [name] is a very charming 73 year old white female.  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  This was actually a small lesion.  We have a whole lot going on right now.  I really hate that that she is having the facial issues.  This certainly takes priority.  Hopefully, the clindamycin will help.  I GIST.  Dr. Carola Frost of Orthopedic Surgery.  He would like to have a CT scan of the right hip.  He said this can give him some more information as to what this area of abnormality might be.  He says that he would like to see her back in a week.  She wants to go out of town next week.  She has sisters that  she is going to go down to [place] to visit.  I think she should go down to [place].  I do not think that Dr. Carola Frost would have a problem with her going down there.  We would have to hold her Xgeva at this point.  I would like to see her back in about a couple weeks or so.  I know this is quite complicated.  There is a lot going on.    Josph Macho, MD 3/20/20258:55 AM

## 2023-10-26 NOTE — Patient Instructions (Addendum)
 It was good to see you again today - please continue to use the clindamycin and monitor your condition carefully If you are getting worse we may need to use IV antibiotics- which may necessitate admission to the hospital.  I hope this will not happen but please keep me posted!

## 2023-10-26 NOTE — ED Notes (Signed)
 Pt returned from CT

## 2023-10-26 NOTE — Progress Notes (Signed)
 10/26/2023 BP remains elevated, 151/74. Cardiologist is monitoring.

## 2023-10-26 NOTE — ED Notes (Signed)
 Pt transported to CT ?

## 2023-10-26 NOTE — Telephone Encounter (Signed)
 Advised via MyChart.

## 2023-10-26 NOTE — Telephone Encounter (Signed)
-----   Message from Josph Macho sent at 10/26/2023  3:19 PM EDT ----- Call and let her know that the iron is on the low side.  We really need to give her some IV iron.  I know that she will be gone next week.  Please set this up for when she returns from her vacation in [place].  Thanks.  Cindee Lame

## 2023-10-26 NOTE — Progress Notes (Signed)
 [facility] [address], [address] [place], [place] [address] [id number] [phone]  Date:  10/26/2023   Name:  Laura Bush   DOB:  [birth year]   MRN:  [id number]  PCP:  Pearline Cables, MD    Chief Complaint: ER follow up (10/25/23 facial cellulitis and facial numbness on 10/23/23/Concerns/ questions: pt asks if what she is doing is the right tx? )   History of Present Illness:  Laura Bush is a 73 y.o. 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 3 months  -next dose in 11/2023   S/P SBRT to LUL -- completed on 03/04/2022 S/P XRT to RIGHT femur -- completed on 03/04/2022 Alecensa 600 mg po BID -- start on 03/24/2022 --on hold starting 04/04/2023 -restart 300 mg p.o. twice daily on 05/04/2023 --DC on 05/26/2023 Lorbrena 100 mg po q day  -- start on 06/14/2023 Aranesp 300 mcg subcu every 3 weeks Venofer-given on 07/10/2023  She saw my partner Sandford Craze on March 17 with facial pain-was to start her on Augmentin She ended up in the ER the same day with facial pain, she was evaluated and released to home She ended up getting a root canal yesterday- that evening she developed a low grade temp and went back to the ER as she had been instructed - oral antibiotic changed to  clindamycin  Her left face is still significantly swollen and tender, the patient and her husband both note it is significantly better since yesterday.  She had a CT of her face yesterday: IMPRESSION: 1. Soft tissue swelling with hazy inflammatory stranding within the left face, primarily involving the left masticator space, suspicious for acute infection/cellulitis. No discrete abscess or drainable fluid collection. 2. Sequelae of prior root canal at the first mandibular molars bilaterally as well as the left maxillary cuspid. Associated Periapical lucency at the left maxillary cuspid. 3. Mild-to-moderate paranasal sinus disease as above.  No fever She is able to chew on the right side of her mouth-she has to be more careful on the left side due to recent root canal  She was seen by oncology this am: they want to do a hip CT for Dr Carola Frost  We think the cancer is back in the right hip/ femur She is not really having pain in her leg Her husband is with her today - he is very supportive  Patient Active Problem List   Diagnosis Date Noted   Facial pain 10/23/2023   Anemia associated with stage 3 chronic renal failure (HCC) 04/18/2023   Lower extremity edema 12/21/2022   Insulin resistance 09/13/2022   Motion sickness 01/20/2022   Visit for suture removal 01/20/2022   HLD (hyperlipidemia) 01/20/2022   Non-small cell lung cancer metastatic to bone (HCC) 01/14/2022   Goals of care, counseling/discussion 01/14/2022  Laceration of left eyebrow 01/04/2022   Mass of lower lobe of left lung 01/04/2022   Aortic atherosclerosis (HCC) 01/04/2022   CAD (coronary artery disease) 01/04/2022   Pathologic fracture of femur (HCC) 01/03/2022   OSA on CPAP 01/03/2022   Impaired fasting glucose, with polyphagia 12/15/2021   Hemochromatosis 01/21/2021   Generalized obesity 11/11/2020   Metabolic syndrome 04/21/2020   Mixed hyperlipidemia 04/21/2020   Polyp of ascending colon 04/21/2020   History  of breast cancer, Tamoxifen, nearing five years, Dr. 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/29/2017   Vitamin D deficiency 01/29/2016   Eczema 12/29/2014   Venous insufficiency 10/16/2014   Primary hypertension 10/01/2014   Hypercholesteremia 10/01/2014   GERD (gastroesophageal reflux disease) 10/01/2014   Obesity 10/23/2013   Obstructive sleep apnea syndrome 07/19/2013   Nodule of finger 12/24/2009   Other bilateral bundle branch block 06/23/2003    Past Medical History:  Diagnosis Date   Allergy    seasonal   Anemia associated with stage 3 chronic renal failure (HCC) 04/18/2023   Arthritis Last several years   Mostly in hands, wrists, shoulders, knees   Asthma    in cold weather   Breast cancer (HCC) 09/18/2014   left breast   Breast cancer (HCC) 10/07/2014   bx left breast   Breast cancer of upper-outer quadrant of left female breast (HCC) 09/22/2014   Cluster headaches    in her 40's   GERD (gastroesophageal reflux disease)    Goals of care, counseling/discussion 01/14/2022   Heart murmur    as a child (has outgrown)   High cholesterol    History of radiation therapy    Left lung, Right Femur- 02/16/22-03/04/22- Dr. 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"   Stroke [facility]) summer 2022   described as tiny   Swallowing difficulty     Venous insufficiency     Past Surgical History:  Procedure Laterality Date   ABDOMINAL HYSTERECTOMY  1999   APPENDECTOMY  ~ 2006   BONE BIOPSY Right 01/04/2022   Procedure: RIGHT FEMORAL BONE BIOPSY;  Surgeon: Myrene Galas, MD;  Location: [facility] OR;  Service: Orthopedics;  Laterality: Right;   BONE EXCISION Right 04/11/2023   Procedure: BONE EXCISION;  Surgeon: Myrene Galas, MD;  Location: [facility] OR;  Service: Orthopedics;  Laterality: Right;   BREAST BIOPSY Left 10/2014   BREAST LUMPECTOMY Left 2016   BREAST REDUCTION SURGERY Bilateral 11/11/2014   Procedure: Bilateral Breast Reduction;  Surgeon: Etter Sjogren, MD;  Location: [facility] OR;  Service: Plastics;  Laterality: Bilateral;   BRONCHIAL BIOPSY  01/06/2022   Procedure: BRONCHIAL BIOPSIES;  Surgeon: Leslye Peer, MD;  Location: [facility] ENDOSCOPY;  Service: Pulmonary;;   BRONCHIAL BRUSHINGS  01/06/2022   Procedure: BRONCHIAL BRUSHINGS;  Surgeon: Leslye Peer, MD;  Location: [facility] ENDOSCOPY;  Service: Pulmonary;;   BRONCHIAL NEEDLE ASPIRATION BIOPSY  01/06/2022   Procedure: BRONCHIAL NEEDLE ASPIRATION BIOPSIES;  Surgeon: Leslye Peer, MD;  Location: [facility] ENDOSCOPY;  Service: Pulmonary;;   BRONCHIAL WASHINGS  01/06/2022   Procedure: BRONCHIAL WASHINGS;  Surgeon: Leslye Peer, MD;  Location: [facility] ENDOSCOPY;  Service: Pulmonary;;   CARPAL TUNNEL RELEASE Bilateral    2 surgeries on right, 1 on left   CESAREAN SECTION  1978; 1980   COLONOSCOPY     FEMUR IM NAIL Right 01/04/2022   Procedure: RIGHT FEMORAL INTRAMEDULLARY (IM) NAIL;  Surgeon: Myrene Galas, MD;  Location: [facility] OR;  Service: Orthopedics;  Laterality: Right;   FIDUCIAL MARKER PLACEMENT  01/06/2022   Procedure: FIDUCIAL MARKER PLACEMENT;  Surgeon: Leslye Peer, MD;  Location: [facility] ENDOSCOPY;  Service: Pulmonary;;   FOREARM FRACTURE SURGERY Right 2003 X 3   FRACTURE SURGERY  2003, 2023   3 surgeries, right arm -03; Femur-23   HARDWARE REMOVAL Right 04/11/2023   Procedure: HARDWARE  REMOVAL;  Surgeon: Myrene Galas, MD;  Location: [facility] OR;  Service: Orthopedics;  Laterality: Right;   KNEE ARTHROSCOPY Bilateral    meniscus repair   PARTIAL MASTECTOMY WITH NEEDLE LOCALIZATION AND AXILLARY SENTINEL LYMPH NODE BX Left 11/11/2014   Procedure: LEFT BREAST PARTIAL MASTECTOMY WITH NEEDLE LOCALIZATION TIMES TWO AND LEFT AXILLARY SENTINEL LYMPH NODE Biopsy;  Surgeon: Claud Kelp, MD;  Location: [facility] OR;  Service: General;  Laterality: Left;   REDUCTION MAMMAPLASTY Bilateral 2016   SQUAMOUS CELL CARCINOMA EXCISION  1980's X 1   "nose"   TONSILLECTOMY  ~ 1957   TUBAL LIGATION  ?1984    Social History   Tobacco Use   Smoking status: Never   Smokeless tobacco: Never  Vaping Use   Vaping status: Never Used  Substance Use Topics   Alcohol use: Yes    Alcohol/week: 2.0 standard drinks of alcohol    Types: 2 Glasses of wine per week    Comment: 2 glasses a week   Drug use: No    Family History  Problem Relation Age of Onset   Dementia Mother        Lives in [place]   Hypertension Mother    Hyperlipidemia Mother    Stroke Father        deceased   Heart failure Father 56   Hypertension Father    Uterine cancer Sister 60   Heart attack Maternal Grandmother    Hypertension Maternal Grandfather    Stroke Paternal Grandfather    Colon cancer Neg Hx    Esophageal cancer Neg Hx    Pancreatic cancer Neg Hx    Stomach cancer Neg Hx    Liver disease Neg Hx    Sleep apnea Neg Hx     Allergies  Allergen Reactions   Diclofenac Hypertension   Amlodipine Swelling and Other (See Comments)    Limbs swell, not the throat   Lanolin Other (See Comments)    Sneezing and watery eyes. Allergic to [facility]   Tape Other (See Comments)    Redness (Bandaids also)    Medication list has been reviewed and updated.  Current Outpatient Medications on File Prior to Visit  Medication Sig Dispense Refill   acetaminophen (TYLENOL) 500 MG tablet Take 1 tablet (500 mg total) by mouth  every 8 (eight) hours as needed for mild pain or moderate pain. 30 tablet 0   Albuterol-Budesonide 90-80 MCG/ACT AERO Inhale  2 puffs into the lungs every 6 (six) hours as needed (wheezing, sob). 10.7 g 1   aspirin EC 81 MG tablet Take 81 mg by mouth in the morning. Swallow whole.     B Complex-C (SUPER B COMPLEX PO) Take 1 tablet by mouth daily with breakfast.     Calcium Carb-Cholecalciferol (CALCIUM + D3 PO) Take 1 tablet by mouth in the morning.     cholecalciferol (VITAMIN D3) 25 MCG (1000 UNIT) tablet Take 1,000 Units by mouth in the morning.     clindamycin (CLEOCIN) 150 MG capsule Take 2 capsules (300 mg total) by mouth 4 (four) times daily for 7 days. 56 capsule 0   COLLAGEN PO Take 3 tablets by mouth in the morning and at bedtime. With Biotin     Denosumab (XGEVA Cos Cob) Inject into the skin every 3 (three) months.     Evolocumab (REPATHA SURECLICK) 140 MG/ML SOAJ Inject 140 mg into the skin every 14 (fourteen) days. 6 mL 3   fexofenadine (ALLEGRA) 180 MG tablet Take 180 mg by mouth in the morning.     folic acid (FOLATE) 400 MCG tablet Take 400 mcg by mouth in the morning.     furosemide (LASIX) 20 MG tablet Take Lasix two tablets (20 mg each) once a day 60 tablet 4   ibuprofen (ADVIL) 200 MG tablet Take 600 mg by mouth daily as needed.     ipratropium (ATROVENT) 0.06 % nasal spray Place 2 sprays into both nostrils 4 (four) times daily as needed for rhinitis. 15 mL 4   lorlatinib (LORBRENA) 100 MG tablet Take 1 tablet (100 mg total) by mouth daily. Swallow tablets whole. Do not chew, crush or split tablets. 30 tablet 0   metFORMIN (GLUCOPHAGE) 500 MG tablet TAKE TWO TABELTS AT LUNCH AND ONE TABLET AT DINNER 270 tablet 0   NIFEdipine (PROCARDIA XL/NIFEDICAL XL) 60 MG 24 hr tablet Take 1 tablet (60 mg total) by mouth daily. 90 tablet 1   pantoprazole (PROTONIX) 40 MG tablet Take 1 tablet (40 mg total) by mouth daily. 90 tablet 0   spironolactone (ALDACTONE) 25 MG tablet Take 1 tablet (25 mg  total) by mouth daily. 90 tablet 3   traZODone (DESYREL) 50 MG tablet TAKE 1 TABLET BY MOUTH EVERY NIGHT AT BEDTIME AS NEEDED FOR SLEEP (Patient taking differently: Take 50 mg by mouth at bedtime. for sleep) 90 tablet 2   valsartan (DIOVAN) 80 MG tablet Take 80 mg by mouth daily.     No current facility-administered medications on file prior to visit.    Review of Systems:  As per HPI- otherwise negative.   Physical Examination: Vitals:   10/26/23 1419  BP: 122/82  Pulse: 83  Resp: 18  Temp: 97.8 F (36.6 C)  SpO2: 95%   Vitals:   10/26/23 1419  Weight: 193 lb 6.4 oz (87.7 kg)  Height: 4\' 11"  (1.499 m)   Body mass index is 39.06 kg/m. Ideal Body Weight: Weight in (lb) to have BMI = 25: 123.5  GEN: no acute distress.  Overweight, looks well except for facial swelling HEENT: Atraumatic, Normocephalic.  Bilateral TM wnl, oropharynx normal.  PEERL,EOMI.   She has tenderness, mild redness and swelling under her left eye and lateral to her left nose as pictured below.  Patient states this is much improved from yesterday, they show me a previous photo and she does look much better Ears and Nose: No external deformity. CV: RRR, No M/G/R. No JVD. No  thrill. No extra heart sounds. PULM: CTA B, no wheezes, crackles, rhonchi. No retractions. No resp. distress. No accessory muscle use. EXTR: No c/c/e PSYCH: Normally interactive. Conversant.    Assessment and Plan:  Facial cellulitis  Patient seen today for follow-up of facial cellulitis as described above.  She is now on oral clindamycin and does seem to be improving.  Asked patient and her husband to monitor her progress closely.  If she stops getting better or starts to get worse she likely would need to be admitted for IV antibiotics.  They state understanding and will keep a close watch on things.  They will reach out to me for assistance as needed   Signed Abbe Amsterdam, MD

## 2023-10-26 NOTE — Telephone Encounter (Signed)
 Per inbasket pt needs 2 doses of Iv iron. Called and lvm.

## 2023-10-26 NOTE — ED Provider Notes (Signed)
 [place] EMERGENCY DEPARTMENT AT [facility] Provider Note   CSN: [id number] Arrival date & time: 10/25/23  2236     History  No chief complaint on file.   Laura Bush is a 73 y.o. female.  The history is provided by the patient.  Laura Bush is a 73 y.o. female who presents to the Emergency Department complaining of facial swelling.  She presents to the emergency department for evaluation of facial swelling following a root canal that took place on Wednesday.  She did have some mild facial swelling over the weekend and was started on Augmentin on Monday.  She saw Dr. Kateri Plummer with [facility] endodontics on Wednesday and had a root canal performed of tooth #10.  She states that following the procedure she developed significantly worsening of her left facial swelling.  She did have a temp to 99.4.  She also has a history of stage IV lung cancer, takes oral lorbrena daily.  No chest pain, sob, throat pain, difficulty breathing.    Home Medications Prior to Admission medications   Medication Sig Start Date End Date Taking? Authorizing Provider  clindamycin (CLEOCIN) 150 MG capsule Take 2 capsules (300 mg total) by mouth 4 (four) times daily for 7 days. 10/26/23 11/02/23 Yes Tilden Fossa, MD  acetaminophen (TYLENOL) 500 MG tablet Take 1 tablet (500 mg total) by mouth every 8 (eight) hours as needed for mild pain or moderate pain. 01/07/22   Montez Morita, PA-C  Albuterol-Budesonide 90-80 MCG/ACT AERO Inhale 2 puffs into the lungs every 6 (six) hours as needed (wheezing, sob). 09/08/23   Sharlene Dory, DO  aspirin EC 81 MG tablet Take 81 mg by mouth in the morning. Swallow whole.    [provider]  B Complex-C (SUPER B COMPLEX PO) Take 1 tablet by mouth daily with breakfast.    [provider]  Calcium Carb-Cholecalciferol (CALCIUM + D3 PO) Take 1 tablet by mouth in the morning.    [provider]  cholecalciferol (VITAMIN D3) 25 MCG  (1000 UNIT) tablet Take 1,000 Units by mouth in the morning.    [provider]  COLLAGEN PO Take 3 tablets by mouth in the morning and at bedtime. With Biotin    [provider]  Denosumab (XGEVA Ignacio) Inject into the skin every 3 (three) months.    [provider]  Evolocumab (REPATHA SURECLICK) 140 MG/ML SOAJ Inject 140 mg into the skin every 14 (fourteen) days. 07/22/23   Chilton Si, MD  fexofenadine (ALLEGRA) 180 MG tablet Take 180 mg by mouth in the morning.    [provider]  folic acid (FOLATE) 400 MCG tablet Take 400 mcg by mouth in the morning.    [provider]  furosemide (LASIX) 20 MG tablet Take Lasix two tablets (20 mg each) once a day 07/28/23   Josph Macho, MD  ibuprofen (ADVIL) 200 MG tablet Take 600 mg by mouth daily as needed.    [provider]  ipratropium (ATROVENT) 0.06 % nasal spray Place 2 sprays into both nostrils 4 (four) times daily as needed for rhinitis. 01/31/23   Copland, Gwenlyn Found, MD  lorlatinib (LORBRENA) 100 MG tablet Take 1 tablet (100 mg total) by mouth daily. Swallow tablets whole. Do not chew, crush or split tablets. 10/25/23   Josph Macho, MD  metFORMIN (GLUCOPHAGE) 500 MG tablet TAKE TWO TABELTS AT LUNCH AND ONE TABLET AT [facility] 03/16/23   Whitmire, Dawn, FNP  NIFEdipine (PROCARDIA  XL/NIFEDICAL XL) 60 MG 24 hr tablet Take 1 tablet (60 mg total) by mouth daily. 10/20/23   Chilton Si, MD  pantoprazole (PROTONIX) 40 MG tablet Take 1 tablet (40 mg total) by mouth daily. 08/17/23   Copland, Gwenlyn Found, MD  spironolactone (ALDACTONE) 25 MG tablet Take 1 tablet (25 mg total) by mouth daily. 07/03/23   Chilton Si, MD  traZODone (DESYREL) 50 MG tablet TAKE 1 TABLET BY MOUTH EVERY NIGHT AT BEDTIME AS NEEDED FOR SLEEP Patient taking differently: Take 50 mg by mouth at bedtime. for sleep 03/25/23   Josph Macho, MD      Allergies    Diclofenac, Amlodipine, Lanolin, and Tape    Review of  Systems   Review of Systems  All other systems reviewed and are negative.   Physical Exam Updated Vital Signs BP (!) 159/83   Pulse 81   Temp 98.5 F (36.9 C)   Resp 20   Ht 4\' 11"  (1.499 m)   Wt 86.2 kg   SpO2 96%   BMI 38.38 kg/m  Physical Exam Vitals and nursing note reviewed.  Constitutional:      Appearance: She is well-developed.  HENT:     Head: Normocephalic and atraumatic.     Comments: Moderate facial erythema and edema over maxillary region.  Mild left periorbital edema.  No significant edema in the oropharynx.  Cardiovascular:     Rate and Rhythm: Normal rate and regular rhythm.     Heart sounds: No murmur heard. Pulmonary:     Effort: Pulmonary effort is normal. No respiratory distress.     Breath sounds: Normal breath sounds.  Abdominal:     Palpations: Abdomen is soft.     Tenderness: There is no abdominal tenderness. There is no guarding or rebound.  Musculoskeletal:        General: No tenderness.  Skin:    General: Skin is warm and dry.  Neurological:     Mental Status: She is alert and oriented to person, place, and time.  Psychiatric:        Behavior: Behavior normal.     ED Results / Procedures / Treatments   Labs (all labs ordered are listed, but only abnormal results are displayed) Labs Reviewed  CBC WITH DIFFERENTIAL/PLATELET - Abnormal; Notable for the following components:      Result Value   RBC 3.51 (*)    Hemoglobin 11.7 (*)    HCT 34.6 (*)    All other components within normal limits  COMPREHENSIVE METABOLIC PANEL - Abnormal; Notable for the following components:   Sodium 134 (*)    Glucose, Bld 139 (*)    Alkaline Phosphatase 37 (*)    Total Bilirubin 1.3 (*)    All other components within normal limits    EKG None  Radiology CT Maxillofacial W Contrast Result Date: 10/26/2023 CLINICAL DATA:  In for acute facial swelling post root canal. EXAM: CT MAXILLOFACIAL WITH CONTRAST TECHNIQUE: Multidetector CT imaging of the  maxillofacial structures was performed with intravenous contrast. Multiplanar CT image reconstructions were also generated. RADIATION DOSE REDUCTION: This exam was performed according to the departmental dose-optimization program which includes automated exposure control, adjustment of the mA and/or kV according to patient size and/or use of iterative reconstruction technique. CONTRAST:  75mL OMNIPAQUE IOHEXOL 350 MG/ML SOLN COMPARISON:  None Available. FINDINGS: Osseous: No acute osseous abnormality. No discrete or worrisome osseous lesions. Torus palatini noted. Moderate spondylosis noted at C5-6. Orbits: Globes orbital soft tissues within  normal limits. Sinuses: Mild-to-moderate mucosal thickening present about the right sphenoid and left maxillary sinus. Comparatively mild mucosal thickening present about the ethmoidal air cells. Mastoid air cells and middle ear cavities are clear. Soft tissues: Sequelae of prior root canal at the first mandibular molars bilaterally as well as the left maxillary cuspid. Periapical lucency noted at the left maxillary cuspid as well (series 5, image 43). Soft tissue swelling with hazy inflammatory stranding seen within the left face, primarily involving the left masticator space, nonspecific, but could reflect acute infection/cellulitis. No discrete abscess or drainable fluid collection. Remainder of the visualized facial soft tissues otherwise unremarkable. Limited intracranial: Unremarkable. IMPRESSION: 1. Soft tissue swelling with hazy inflammatory stranding within the left face, primarily involving the left masticator space, suspicious for acute infection/cellulitis. No discrete abscess or drainable fluid collection. 2. Sequelae of prior root canal at the first mandibular molars bilaterally as well as the left maxillary cuspid. Associated Periapical lucency at the left maxillary cuspid. 3. Mild-to-moderate paranasal sinus disease as above. Electronically Signed   By: Rise Mu M.D.   On: 10/26/2023 01:32     Procedures Procedures    Medications Ordered in ED Medications  clindamycin (CLEOCIN) IVPB 900 mg (900 mg Intravenous New Bag/Given 10/26/23 0330)  iohexol (OMNIPAQUE) 350 MG/ML injection 75 mL (75 mLs Intravenous Contrast Given 10/26/23 0118)  morphine (PF) 4 MG/ML injection 4 mg (4 mg Intravenous Given 10/26/23 0328)    ED Course/ Medical Decision Making/ A&P                                 Medical Decision Making Risk Prescription drug management.   Patient with history of lung cancer here for evaluation of facial swelling after root canal of tooth 10 yesterday.  She does have soft tissue swelling to the face.  No evidence of sepsis.  Will change her antibiotics to clindamycin.  Discussed close dentistry follow-up as well as return precautions.  No remaining abscess on imaging.        Final Clinical Impression(s) / ED Diagnoses Final diagnoses:  Facial cellulitis    Rx / DC Orders ED Discharge Orders          Ordered    clindamycin (CLEOCIN) 150 MG capsule  4 times daily        10/26/23 0258              Tilden Fossa, MD 10/26/23 [phone]

## 2023-10-27 ENCOUNTER — Other Ambulatory Visit (HOSPITAL_COMMUNITY): Payer: Self-pay

## 2023-10-29 ENCOUNTER — Ambulatory Visit (HOSPITAL_BASED_OUTPATIENT_CLINIC_OR_DEPARTMENT_OTHER)
Admission: RE | Admit: 2023-10-29 | Discharge: 2023-10-29 | Disposition: A | Discharge status: Home / Self Care | Source: Ambulatory Visit | Attending: Hematology & Oncology | Admitting: Hematology & Oncology

## 2023-10-29 DIAGNOSIS — C7951 Secondary malignant neoplasm of bone: Secondary | ICD-10-CM | POA: Insufficient documentation

## 2023-10-29 DIAGNOSIS — M898X5 Other specified disorders of bone, thigh: Secondary | ICD-10-CM | POA: Diagnosis not present

## 2023-10-29 DIAGNOSIS — C3490 Malignant neoplasm of unspecified part of unspecified bronchus or lung: Secondary | ICD-10-CM | POA: Diagnosis not present

## 2023-10-29 MED ORDER — IOHEXOL 300 MG/ML  SOLN
100.0000 mL | Freq: Once | INTRAMUSCULAR | Status: AC | PRN
  Administered 2023-10-29: 100 mL via INTRAVENOUS

## 2023-11-01 ENCOUNTER — Telehealth (HOSPITAL_BASED_OUTPATIENT_CLINIC_OR_DEPARTMENT_OTHER): Payer: Self-pay | Admitting: Pharmacist Clinician (PhC)/ Clinical Pharmacy Specialist

## 2023-11-01 DIAGNOSIS — E782 Mixed hyperlipidemia: Secondary | ICD-10-CM

## 2023-11-01 NOTE — Telephone Encounter (Signed)
 Lipid labs due

## 2023-11-03 ENCOUNTER — Other Ambulatory Visit: Payer: Self-pay

## 2023-11-05 ENCOUNTER — Other Ambulatory Visit (HOSPITAL_BASED_OUTPATIENT_CLINIC_OR_DEPARTMENT_OTHER): Payer: Self-pay | Admitting: Cardiovascular Disease

## 2023-11-05 ENCOUNTER — Encounter: Payer: Self-pay | Admitting: Hematology & Oncology

## 2023-11-06 ENCOUNTER — Inpatient Hospital Stay

## 2023-11-06 ENCOUNTER — Encounter: Payer: Self-pay | Admitting: Hematology & Oncology

## 2023-11-06 VITALS — BP 151/57 | HR 64 | Temp 97.8°F | Resp 18

## 2023-11-06 DIAGNOSIS — C3432 Malignant neoplasm of lower lobe, left bronchus or lung: Secondary | ICD-10-CM | POA: Diagnosis not present

## 2023-11-06 DIAGNOSIS — C7951 Secondary malignant neoplasm of bone: Secondary | ICD-10-CM

## 2023-11-06 DIAGNOSIS — D509 Iron deficiency anemia, unspecified: Secondary | ICD-10-CM | POA: Diagnosis not present

## 2023-11-06 MED ORDER — SODIUM CHLORIDE 0.9 % IV SOLN
300.0000 mg | Freq: Once | INTRAVENOUS | Status: AC
  Administered 2023-11-06: 300 mg via INTRAVENOUS
  Filled 2023-11-06: qty 300

## 2023-11-06 MED ORDER — SODIUM CHLORIDE 0.9 % IV SOLN: INTRAVENOUS | Status: DC

## 2023-11-06 NOTE — Patient Instructions (Signed)

## 2023-11-07 DIAGNOSIS — E782 Mixed hyperlipidemia: Secondary | ICD-10-CM | POA: Diagnosis not present

## 2023-11-08 ENCOUNTER — Encounter: Payer: Self-pay | Admitting: Hematology & Oncology

## 2023-11-08 DIAGNOSIS — M25551 Pain in right hip: Secondary | ICD-10-CM | POA: Diagnosis not present

## 2023-11-08 DIAGNOSIS — S72301D Unspecified fracture of shaft of right femur, subsequent encounter for closed fracture with routine healing: Secondary | ICD-10-CM | POA: Diagnosis not present

## 2023-11-08 DIAGNOSIS — T8484XD Pain due to internal orthopedic prosthetic devices, implants and grafts, subsequent encounter: Secondary | ICD-10-CM | POA: Diagnosis not present

## 2023-11-08 LAB — LIPID PANEL
Chol/HDL Ratio: 3.2 ratio (ref 0.0–4.4)
Cholesterol, Total: 211 mg/dL — ABNORMAL HIGH (ref 100–199)
HDL: 66 mg/dL (ref >39)
LDL Chol Calc (NIH): 119 mg/dL — ABNORMAL HIGH (ref 0–99)
Triglycerides: 148 mg/dL (ref 0–149)
VLDL Cholesterol Cal: 26 mg/dL (ref 5–40)

## 2023-11-09 ENCOUNTER — Ambulatory Visit: Payer: Medicare HMO

## 2023-11-09 VITALS — Ht 59.0 in | Wt 185.0 lb

## 2023-11-09 DIAGNOSIS — Z0000 Encounter for general adult medical examination without abnormal findings: Secondary | ICD-10-CM | POA: Diagnosis not present

## 2023-11-09 NOTE — Addendum Note (Signed)
 Addended by: Tillie Rung on: 11/09/2023 01:38 PM   Modules accepted: Level of Service

## 2023-11-09 NOTE — Patient Instructions (Addendum)
 Ms. [place] , Thank you for taking time to come for your Medicare Wellness Visit. I appreciate your ongoing commitment to your health goals. Please review the following plan we discussed and let me know if I can assist you in the future.   Referrals/Orders/Follow-Ups/Clinician Recommendations:    This is a list of the screening recommended for you and due dates:  Health Maintenance  Topic Date Due   COVID-19 Vaccine (7 - Pfizer risk 2024-25 season) 12/05/2023   Flu Shot  03/08/2024   Medicare Annual Wellness Visit  11/08/2024   Colon Cancer Screening  04/09/2025   Mammogram  10/09/2025   DTaP/Tdap/Td vaccine (3 - Td or Tdap) 04/27/2032   Pneumonia Vaccine  Completed   DEXA scan (bone density measurement)  Completed   Hepatitis C Screening  Completed   Zoster (Shingles) Vaccine  Completed   HPV Vaccine  Aged Out   Cologuard (Stool DNA test)  Discontinued    Advanced directives: (In Chart) A copy of your advanced directives are scanned into your chart should your provider ever need it.  Next Medicare Annual Wellness Visit scheduled for next year: Yes

## 2023-11-09 NOTE — Progress Notes (Addendum)
 Subjective:   Laura Bush is a 73 y.o. who presents for a Medicare Wellness preventive visit.  Visit Complete: Virtual I connected with  Laura Bush on 11/09/23 by a audio enabled telemedicine application and verified that I am speaking with the correct person using two identifiers.  Patient Location: Home  Provider Location: Home Office  I discussed the limitations of evaluation and management by telemedicine. The patient expressed understanding and agreed to proceed.  Vital Signs: Because this visit was a virtual/telehealth visit, some criteria may be missing or patient reported. Any vitals not documented were not able to be obtained and vitals that have been documented are patient reported.  VideoError- Librarian, academic were attempted between this provider and patient, however failed, due to patient having technical difficulties OR patient did not have access to video capability.  We continued and completed visit with audio only.   Persons Participating in Visit: Patient.  AWV Questionnaire: Yes: Patient Medicare AWV questionnaire was completed by the patient on 11/02/23; I have confirmed that all information answered by patient is correct and no changes since this date.  Cardiac Risk Factors include: advanced age (>32men, >57 women);hypertension     Objective:    Today's Vitals   11/09/23 0853 11/09/23 0855  Weight: 185 lb (83.9 kg)   Height: 4\' 11"  (1.499 m)   PainSc:  0-No pain   Body mass index is 37.37 kg/m.     11/09/2023    9:07 AM 10/26/2023    8:47 AM 10/25/2023   11:22 PM 10/23/2023    2:59 PM 09/22/2023    9:27 AM 08/25/2023   10:11 AM 07/28/2023    2:29 PM  Advanced Directives  Does Patient Have a Medical Advance Directive? Yes Yes Yes Yes Yes Yes Yes  Type of Estate agent of [place];Living will Healthcare Power of [place];Living will Living will;Healthcare Power of [employer] Power  of [place];Living will Healthcare Power of [place];Living will Living will Living will;Healthcare Power of Attorney  Does patient want to make changes to medical advance directive? No - Patient declined    No - Patient declined No - Patient declined   Copy of Healthcare Power of Attorney in Chart? Yes - validated most recent copy scanned in chart (See row information) No - copy requested   No - copy requested      Current Medications (verified) Outpatient Encounter Medications as of 11/09/2023  Medication Sig   acetaminophen (TYLENOL) 500 MG tablet Take 1 tablet (500 mg total) by mouth every 8 (eight) hours as needed for mild pain or moderate pain.   Albuterol-Budesonide 90-80 MCG/ACT AERO Inhale 2 puffs into the lungs every 6 (six) hours as needed (wheezing, sob).   aspirin EC 81 MG tablet Take 81 mg by mouth in the morning. Swallow whole.   B Complex-C (SUPER B COMPLEX PO) Take 1 tablet by mouth daily with breakfast.   Calcium Carb-Cholecalciferol (CALCIUM + D3 PO) Take 1 tablet by mouth in the morning.   cholecalciferol (VITAMIN D3) 25 MCG (1000 UNIT) tablet Take 1,000 Units by mouth in the morning.   COLLAGEN PO Take 3 tablets by mouth in the morning and at bedtime. With Biotin   Denosumab (XGEVA [place]) Inject into the skin every 3 (three) months.   Evolocumab (REPATHA SURECLICK) 140 MG/ML SOAJ Inject 140 mg into the skin every 14 (fourteen) days.   fexofenadine (ALLEGRA) 180 MG tablet Take 180 mg by mouth in the morning.  folic acid (FOLATE) 400 MCG tablet Take 400 mcg by mouth in the morning.   furosemide (LASIX) 20 MG tablet Take Lasix two tablets (20 mg each) once a day   ibuprofen (ADVIL) 200 MG tablet Take 600 mg by mouth daily as needed.   ipratropium (ATROVENT) 0.06 % nasal spray Place 2 sprays into both nostrils 4 (four) times daily as needed for rhinitis.   lorlatinib (LORBRENA) 100 MG tablet Take 1 tablet (100 mg total) by mouth daily. Swallow tablets whole. Do not chew, crush or  split tablets.   metFORMIN (GLUCOPHAGE) 500 MG tablet TAKE TWO TABELTS AT LUNCH AND ONE TABLET AT DINNER   NIFEdipine (PROCARDIA XL/NIFEDICAL XL) 60 MG 24 hr tablet Take 1 tablet (60 mg total) by mouth daily.   pantoprazole (PROTONIX) 40 MG tablet Take 1 tablet (40 mg total) by mouth daily.   spironolactone (ALDACTONE) 25 MG tablet Take 1 tablet (25 mg total) by mouth daily.   traZODone (DESYREL) 50 MG tablet TAKE 1 TABLET BY MOUTH EVERY NIGHT AT BEDTIME AS NEEDED FOR SLEEP (Patient taking differently: Take 50 mg by mouth at bedtime. for sleep)   valsartan (DIOVAN) 80 MG tablet TAKE 1 TABLET BY MOUTH DAILY   No facility-administered encounter medications on file as of 11/09/2023.    Allergies (verified) Diclofenac, Amlodipine, Lanolin, and Tape   History: Past Medical History:  Diagnosis Date   Allergy    seasonal   Anemia associated with stage 3 chronic renal failure (HCC) 04/18/2023   Arthritis Last several years   Mostly in hands, wrists, shoulders, knees   Asthma    in cold weather   Breast cancer (HCC) 09/18/2014   left breast   Breast cancer (HCC) 10/07/2014   bx left breast   Breast cancer of upper-outer quadrant of left female breast (HCC) 09/22/2014   Cluster headaches    in her 40's   GERD (gastroesophageal reflux disease)    Goals of care, counseling/discussion 01/14/2022   Heart murmur    as a child (has outgrown)   High cholesterol    History of radiation therapy    Left lung, Right Femur- 02/16/22-03/04/22- Dr. 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) summer 2022   described as tiny   Swallowing difficulty    Venous insufficiency    Past Surgical History:  Procedure Laterality Date   ABDOMINAL HYSTERECTOMY  1999   APPENDECTOMY  ~ 2006   BONE BIOPSY Right 01/04/2022   Procedure: RIGHT FEMORAL BONE BIOPSY;  Surgeon: Myrene Galas, MD;  Location: [facility] OR;  Service: Orthopedics;  Laterality: Right;   BONE EXCISION Right 04/11/2023   Procedure: BONE EXCISION;  Surgeon: Myrene Galas, MD;  Location: [facility] OR;  Service: Orthopedics;  Laterality: Right;   BREAST BIOPSY Left 10/2014   BREAST LUMPECTOMY Left 2016   BREAST REDUCTION SURGERY Bilateral 11/11/2014   Procedure: Bilateral Breast Reduction;  Surgeon: Etter Sjogren, MD;  Location: [facility] OR;  Service: Plastics;  Laterality: Bilateral;   BRONCHIAL BIOPSY  01/06/2022   Procedure: BRONCHIAL BIOPSIES;  Surgeon: Leslye Peer, MD;  Location: [facility] ENDOSCOPY;  Service: Pulmonary;;   BRONCHIAL BRUSHINGS  01/06/2022   Procedure: BRONCHIAL BRUSHINGS;  Surgeon: Leslye Peer, MD;  Location: [facility] ENDOSCOPY;  Service: Pulmonary;;   BRONCHIAL NEEDLE ASPIRATION BIOPSY  01/06/2022   Procedure: BRONCHIAL NEEDLE ASPIRATION BIOPSIES;  Surgeon: Leslye Peer, MD;  Location: [facility] ENDOSCOPY;  Service: Pulmonary;;   BRONCHIAL WASHINGS  01/06/2022   Procedure: BRONCHIAL WASHINGS;  Surgeon: Leslye Peer, MD;  Location: [facility] ENDOSCOPY;  Service: Pulmonary;;   CARPAL TUNNEL RELEASE Bilateral    2 surgeries on right, 1 on left   CESAREAN SECTION  1978; 1980   COLONOSCOPY     FEMUR IM NAIL Right 01/04/2022   Procedure: RIGHT FEMORAL INTRAMEDULLARY (IM) NAIL;  Surgeon: Myrene Galas, MD;  Location: [facility] OR;  Service: Orthopedics;  Laterality: Right;   FIDUCIAL MARKER PLACEMENT  01/06/2022   Procedure: FIDUCIAL MARKER PLACEMENT;  Surgeon: Leslye Peer, MD;  Location: [facility] ENDOSCOPY;  Service: Pulmonary;;   FOREARM FRACTURE SURGERY Right 2003 X 3    FRACTURE SURGERY  2003, 2023   3 surgeries, right arm -03; Femur-23   HARDWARE REMOVAL Right 04/11/2023   Procedure: HARDWARE REMOVAL;  Surgeon: Myrene Galas, MD;  Location: [facility] OR;  Service: Orthopedics;  Laterality: Right;   KNEE ARTHROSCOPY Bilateral    meniscus repair   PARTIAL MASTECTOMY WITH NEEDLE LOCALIZATION AND AXILLARY SENTINEL LYMPH NODE BX Left 11/11/2014   Procedure: LEFT BREAST PARTIAL MASTECTOMY WITH NEEDLE LOCALIZATION TIMES TWO AND LEFT AXILLARY SENTINEL LYMPH NODE Biopsy;  Surgeon: Claud Kelp, MD;  Location: [facility] OR;  Service: General;  Laterality: Left;   REDUCTION MAMMAPLASTY Bilateral 2016   SQUAMOUS CELL CARCINOMA EXCISION  1980's X 1   "nose"   TONSILLECTOMY  ~ 1957   TUBAL LIGATION  ?1984   Family History  Problem Relation Age of Onset   Dementia Mother        Lives in [place]   Hypertension Mother    Hyperlipidemia Mother    Stroke Father        deceased   Heart failure Father 64   Hypertension Father    Uterine cancer Sister 35   Heart attack Maternal Grandmother    Hypertension Maternal Grandfather    Stroke Paternal Grandfather    Colon cancer Neg Hx    Esophageal cancer Neg Hx    Pancreatic cancer Neg Hx    Stomach cancer Neg Hx    Liver disease Neg Hx    Sleep apnea Neg Hx    Social History   Socioeconomic History   Marital status: Married    Spouse name: Randy Bush   Number of children: Not on file   Years of education: Not on file   Highest education level: Bachelor's degree (e.g., BA, AB, BS)  Occupational History   Occupation: retired  Tobacco Use   Smoking status: Never   Smokeless tobacco: Never  Vaping Use   Vaping status: Never Used  Substance and Sexual Activity   Alcohol use: Yes    Alcohol/week: 2.0 standard drinks of alcohol    Types: 2 Glasses of wine per week    Comment: 2 glasses a week   Drug use: No   Sexual activity: Not Currently  Other Topics Concern   Not on file  Social History Narrative  From  [place] [place] [facility].   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   Social Drivers of Corporate investment banker Strain: Low Risk  (11/09/2023)   Overall Financial Resource Strain (CARDIA)    Difficulty of Paying Living Expenses: Not hard at all  Food Insecurity: No Food Insecurity (11/09/2023)   Hunger Vital Sign    Worried About Running Out of Food in the Last Year: Never true    Ran Out of Food in the Last Year: Never true  Transportation Needs: No Transportation Needs (11/09/2023)   PRAPARE - Administrator, Civil Service (Medical): No    Lack of Transportation (Non-Medical): No  Physical Activity: Inactive (11/09/2023)   Exercise Vital Sign    Days of Exercise per Week: 0 days    Minutes of Exercise per Session: 0 min  Stress: No Stress Concern Present (11/09/2023)   [employer] of Occupational Health - Occupational Stress Questionnaire    Feeling of Stress : Not at all  Social Connections: Socially Integrated (11/09/2023)   Social Connection and Isolation Panel [NHANES]    Frequency of Communication with Friends and Family: More than three times a week    Frequency of Social Gatherings with Friends and Family: More than three times a week    Attends Religious Services: More than 4 times per year    Active Member of [employer] or Organizations: Yes    Attends Engineer, structural: More than 4 times per year    Marital Status: Married    Tobacco Counseling Counseling given: Not Answered    Clinical Intake:  Pre-visit preparation completed: Yes  Pain : No/denies pain Pain Score: 0-No pain     BMI - recorded: 37.37 Nutritional Status: BMI > 30  Obese Nutritional Risks: None Diabetes: No  Lab Results  Component Value Date   HGBA1C 6.1 08/15/2023   HGBA1C 5.5 10/26/2022   HGBA1C 5.3 01/03/2022     How often do you need  to have someone help you when you read instructions, pamphlets, or other written materials from your doctor or pharmacy?: 1 - Never  Interpreter Needed?: No  Information entered by :: Theresa Mulligan LPN   Activities of Daily Living     11/09/2023    9:02 AM 11/02/2023    7:59 AM  In your present state of health, do you have any difficulty performing the following activities:  Hearing? 0 0  Vision? 0 0  Difficulty concentrating or making decisions? 0 0  Walking or climbing stairs? 1 1  Comment Due to Hip pain followed by medical attention   Dressing or bathing? 0 0  Doing errands, shopping? 0 0  Preparing Food and eating ? N N  Using the Toilet? N N  In the past six months, have you accidently leaked urine? Malvin Johns  Comment Wears Breifs. Followed by PCP   Do you have problems with loss of bowel control? N N  Managing your Medications? N N  Managing your Finances? N N  Housekeeping or managing your Housekeeping? N N    Patient Care Team: Copland, Gwenlyn Found, MD as PCP - General (Family Medicine) Chilton Si, MD as PCP - Cardiology (Cardiology) Chilton Si, MD as Attending Physician (Cardiology) Henrene Pastor, RPH-CPP (Pharmacist) Hilarie Fredrickson, MD as Consulting Physician (Gastroenterology) Josph Macho, MD as Medical  Oncologist (Oncology)  Indicate any recent Medical Services you may have received from other than [facility] providers in the past year (date may be approximate).     Assessment:   This is a routine wellness examination for Laura Bush.  Hearing/Vision screen Hearing Screening - Comments:: Denies hearing difficulties   Vision Screening - Comments:: Wears rx glasses - up to date with routine eye exams with  [facility]/Staci Palmer   Goals Addressed               This Visit's Progress     Increase physical activity (pt-stated)        Continue to lose weight.       Depression Screen     11/09/2023    9:01 AM 11/07/2022    1:22 PM 10/25/2022     8:28 AM 12/29/2021    2:01 PM 10/19/2021    9:26 AM 04/05/2021   10:38 AM 01/21/2021    1:25 PM  PHQ 2/9 Scores  PHQ - 2 Score 0 0 0 0 0 2 0    Fall Risk     11/09/2023    9:04 AM 11/02/2023    7:59 AM 11/07/2022    1:22 PM 10/25/2022    8:25 AM 12/29/2021    2:01 PM  Fall Risk   Falls in the past year? 0 1 0 1 0  Number falls in past yr: 0 0 0 1 0  Injury with Fall? 0 0 0 1 0  Comment    fell due to femur fracture because of bone cancer   Risk for fall due to : No Fall Risks  No Fall Risks History of fall(s)   Follow up Falls prevention discussed;Falls evaluation completed  Falls evaluation completed Falls evaluation completed     MEDICARE RISK AT HOME:  Medicare Risk at Home Any stairs in or around the home?: No If so, are there any without handrails?: No Home free of loose throw rugs in walkways, pet beds, electrical cords, etc?: Yes Adequate lighting in your home to reduce risk of falls?: Yes Life alert?: No Use of a cane, walker or w/c?: No Grab bars in the bathroom?: No Shower chair or bench in shower?: No Elevated toilet seat or a handicapped toilet?: Yes  TIMED UP AND GO:  Was the test performed?  No  Cognitive Function: 6CIT completed        11/09/2023    9:07 AM 10/25/2022    8:32 AM 10/19/2021    9:34 AM  6CIT Screen  What Year? 0 points 0 points 0 points  What month? 0 points 0 points 0 points  What time? 0 points 0 points 0 points  Count back from 20 0 points 0 points 0 points  Months in reverse 0 points 0 points 0 points  Repeat phrase 0 points 0 points 0 points  Total Score 0 points 0 points 0 points    Immunizations Immunization History  Administered Date(s) Administered   Fluad Quad(high Dose 65+) 04/03/2019, 04/29/2020, 05/07/2021, 05/13/2022   Fluad Trivalent(High Dose 65+) 05/22/2023   Hepatitis A 08/24/1995, 12/23/1996   Hepatitis B 12/23/1996, 02/14/1997, 05/24/1997, 06/04/1998   IPV 08/24/1995   Influenza Split 04/06/2017   Influenza, High  Dose Seasonal PF 05/10/2016, 04/06/2017   Influenza,inj,Quad PF,6+ Mos 04/30/2015   Influenza-Unspecified 07/07/2005, 06/08/2006, 05/28/2007, 04/23/2008, 05/23/2011, 05/14/2012, 05/20/2013, 04/12/2018, 04/03/2019   Meningococcal Conjugate 05/07/2008   Meningococcal Mcv4o 05/07/2008   Moderna SARS-COV2 Booster Vaccination 12/22/2020   PFIZER(Purple  Top)SARS-COV-2 Vaccination 09/13/2019, 10/08/2019, 05/21/2020   Pfizer Covid-19 Vaccine Bivalent Booster 62yrs & up 05/24/2021, 05/02/2022   Pfizer(Comirnaty)Fall Seasonal Vaccine 12 years and older 05/01/2022, 06/06/2023   Pneumococcal Conjugate-13 03/22/2017   Pneumococcal Polysaccharide-23 06/08/2006, 03/28/2018   Respiratory Syncytial Virus Vaccine,Recomb Aduvanted(Arexvy) 06/06/2022   Tdap 09/19/2011, 04/27/2022   Typhoid Inactivated 05/07/2008   Yellow Fever 05/08/2008   Zoster Recombinant(Shingrix) 10/06/2018, 01/18/2019   Zoster, Live 06/21/2013    Screening Tests Health Maintenance  Topic Date Due   COVID-19 Vaccine (7 - Pfizer risk 2024-25 season) 12/05/2023   INFLUENZA VACCINE  03/08/2024   Medicare Annual Wellness (AWV)  11/08/2024   Colonoscopy  04/09/2025   MAMMOGRAM  10/09/2025   DTaP/Tdap/Td (3 - Td or Tdap) 04/27/2032   Pneumonia Vaccine 68+ Years old  Completed   DEXA SCAN  Completed   Hepatitis C Screening  Completed   Zoster Vaccines- Shingrix  Completed   HPV VACCINES  Aged Out   Fecal DNA (Cologuard)  Discontinued    Health Maintenance  There are no preventive care reminders to display for this patient.  Health Maintenance Items Addressed:    Additional Screening:  Vision Screening: Recommended annual ophthalmology exams for early detection of glaucoma and other disorders of the eye.  Dental Screening: Recommended annual dental exams for proper oral hygiene  Community Resource Referral / Chronic Care Management: CRR required this visit?  No   CCM required this visit?  No     Plan:     I have  personally reviewed and noted the following in the patient's chart:   Medical and social history Use of alcohol, tobacco or illicit drugs  Current medications and supplements including opioid prescriptions. Patient is not currently taking opioid prescriptions. Functional ability and status Nutritional status Physical activity Advanced directives List of other physicians Hospitalizations, surgeries, and ER visits in previous 12 months Vitals Screenings to include cognitive, depression, and falls Referrals and appointments  In addition, I have reviewed and discussed with patient certain preventive protocols, quality metrics, and best practice recommendations. A written personalized care plan for preventive services as well as general preventive health recommendations were provided to patient.     Tillie Rung, LPN   03/09/9561   After Visit Summary: (MyChart) Due to this being a telephonic visit, the after visit summary with patients personalized plan was offered to patient via MyChart   Notes: Nothing significant to report at this time.

## 2023-11-13 ENCOUNTER — Other Ambulatory Visit: Payer: Self-pay | Admitting: Hematology & Oncology

## 2023-11-13 ENCOUNTER — Inpatient Hospital Stay: Attending: Hematology & Oncology

## 2023-11-13 VITALS — BP 126/53 | HR 66 | Temp 97.0°F | Resp 18

## 2023-11-13 DIAGNOSIS — C3432 Malignant neoplasm of lower lobe, left bronchus or lung: Secondary | ICD-10-CM | POA: Diagnosis not present

## 2023-11-13 DIAGNOSIS — D509 Iron deficiency anemia, unspecified: Secondary | ICD-10-CM | POA: Diagnosis not present

## 2023-11-13 DIAGNOSIS — C3490 Malignant neoplasm of unspecified part of unspecified bronchus or lung: Secondary | ICD-10-CM

## 2023-11-13 MED ORDER — SODIUM CHLORIDE 0.9 % IV SOLN
300.0000 mg | Freq: Once | INTRAVENOUS | Status: AC
Start: 2023-11-13 — End: 2023-11-13
  Administered 2023-11-13: 300 mg via INTRAVENOUS
  Filled 2023-11-13: qty 300

## 2023-11-13 MED ORDER — IRON SUCROSE 300 MG IVPB - SIMPLE MED: 300.0000 mg | Freq: Once | Status: DC

## 2023-11-13 MED ORDER — SODIUM CHLORIDE 0.9 % IV SOLN: INTRAVENOUS | Status: DC

## 2023-11-13 NOTE — Progress Notes (Signed)
 Pt declined to wait the 30 minute post infusion monitoring time. Vss, staedy gait noted.

## 2023-11-13 NOTE — Patient Instructions (Signed)

## 2023-11-16 ENCOUNTER — Inpatient Hospital Stay

## 2023-11-16 ENCOUNTER — Encounter: Payer: Self-pay | Admitting: *Deleted

## 2023-11-16 ENCOUNTER — Encounter: Payer: Self-pay | Admitting: Hematology & Oncology

## 2023-11-16 ENCOUNTER — Other Ambulatory Visit: Payer: Self-pay

## 2023-11-16 ENCOUNTER — Inpatient Hospital Stay: Attending: Hematology & Oncology

## 2023-11-16 ENCOUNTER — Inpatient Hospital Stay (HOSPITAL_BASED_OUTPATIENT_CLINIC_OR_DEPARTMENT_OTHER): Admitting: Hematology & Oncology

## 2023-11-16 VITALS — BP 135/58 | HR 66 | Temp 98.3°F | Resp 16 | Ht 59.0 in | Wt 184.0 lb

## 2023-11-16 DIAGNOSIS — C7951 Secondary malignant neoplasm of bone: Secondary | ICD-10-CM

## 2023-11-16 DIAGNOSIS — C3490 Malignant neoplasm of unspecified part of unspecified bronchus or lung: Secondary | ICD-10-CM | POA: Diagnosis not present

## 2023-11-16 DIAGNOSIS — C3432 Malignant neoplasm of lower lobe, left bronchus or lung: Secondary | ICD-10-CM | POA: Diagnosis not present

## 2023-11-16 DIAGNOSIS — D509 Iron deficiency anemia, unspecified: Secondary | ICD-10-CM | POA: Diagnosis not present

## 2023-11-16 LAB — CBC WITH DIFFERENTIAL (CANCER CENTER ONLY)
Abs Immature Granulocytes: 0.05 10*3/uL (ref 0.00–0.07)
Basophils Absolute: 0 10*3/uL (ref 0.0–0.1)
Basophils Relative: 0 %
Eosinophils Absolute: 0.2 10*3/uL (ref 0.0–0.5)
Eosinophils Relative: 4 %
HCT: 37.8 % (ref 36.0–46.0)
Hemoglobin: 12.6 g/dL (ref 12.0–15.0)
Immature Granulocytes: 1 %
Lymphocytes Relative: 24 %
Lymphs Abs: 1.2 10*3/uL (ref 0.7–4.0)
MCH: 32.8 pg (ref 26.0–34.0)
MCHC: 33.3 g/dL (ref 30.0–36.0)
MCV: 98.4 fL (ref 80.0–100.0)
Monocytes Absolute: 0.7 10*3/uL (ref 0.1–1.0)
Monocytes Relative: 13 %
Neutro Abs: 2.9 10*3/uL (ref 1.7–7.7)
Neutrophils Relative %: 58 %
Platelet Count: 210 10*3/uL (ref 150–400)
RBC: 3.84 MIL/uL — ABNORMAL LOW (ref 3.87–5.11)
RDW: 13.3 % (ref 11.5–15.5)
WBC Count: 5 10*3/uL (ref 4.0–10.5)
nRBC: 0 % (ref 0.0–0.2)

## 2023-11-16 LAB — CMP (CANCER CENTER ONLY)
ALT: 21 U/L (ref 0–44)
AST: 18 U/L (ref 15–41)
Albumin: 4.4 g/dL (ref 3.5–5.0)
Alkaline Phosphatase: 53 U/L (ref 38–126)
Anion gap: 10 (ref 5–15)
BUN: 26 mg/dL — ABNORMAL HIGH (ref 8–23)
CO2: 29 mmol/L (ref 22–32)
Calcium: 10.8 mg/dL — ABNORMAL HIGH (ref 8.9–10.3)
Chloride: 102 mmol/L (ref 98–111)
Creatinine: 1.02 mg/dL — ABNORMAL HIGH (ref 0.44–1.00)
GFR, Estimated: 58 mL/min — ABNORMAL LOW (ref >60)
Glucose, Bld: 98 mg/dL (ref 70–99)
Potassium: 3.9 mmol/L (ref 3.5–5.1)
Sodium: 141 mmol/L (ref 135–145)
Total Bilirubin: 0.3 mg/dL (ref 0.0–1.2)
Total Protein: 7.5 g/dL (ref 6.5–8.1)

## 2023-11-16 LAB — IRON AND IRON BINDING CAPACITY (CC-WL,HP ONLY)
Iron: 113 ug/dL (ref 28–170)
Saturation Ratios: 36 % — ABNORMAL HIGH (ref 10.4–31.8)
TIBC: 314 ug/dL (ref 250–450)
UIBC: 201 ug/dL (ref 148–442)

## 2023-11-16 LAB — RETICULOCYTES
Immature Retic Fract: 9.9 % (ref 2.3–15.9)
RBC.: 3.77 MIL/uL — ABNORMAL LOW (ref 3.87–5.11)
Retic Count, Absolute: 61.1 10*3/uL (ref 19.0–186.0)
Retic Ct Pct: 1.6 % (ref 0.4–3.1)

## 2023-11-16 LAB — FERRITIN: Ferritin: 645 ng/mL — ABNORMAL HIGH (ref 11–307)

## 2023-11-16 MED ORDER — FUROSEMIDE 20 MG PO TABS: ORAL_TABLET | ORAL | 4 refills | Status: DC

## 2023-11-16 NOTE — Progress Notes (Signed)
 Hematology and Oncology Follow Up Visit  Laura Bush [id number] [birth year] 73 y.o. 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 3 months  -next dose in 11/2023   -- on hold due to maxillary infection S/P SBRT to LUL -- completed on 03/04/2022 S/P XRT to RIGHT femur -- completed on 03/04/2022 Alecensa 600 mg po BID -- start on 03/24/2022 --on hold starting 04/04/2023 -restart 300 mg p.o. twice daily on 05/04/2023 --DC on 05/26/2023 Lorbrena 100 mg po q day  -- start on 06/14/2023 Aranesp 300 mcg subcu every 3 weeks Venofer-given on 07/10/2023     Interim History:  Laura Bush is back for follow-up.  She seems to doing pretty well.  There is still some uncertainty as what is going on with the right hip.  She has seen Dr. Carola Frost of Orthopedic Surgery.  He wanted to have a CT scan that was done.  The CT scan was done of the hip.  This did show that sclerotic lesion.  However, the radiologist was not sure if this was malignant.  He thought maybe this was a stress fracture.  Laura Bush is going to go to [facility] to see one of the Orthopedic Surgeons in there.  I think the appointment is on 11/29/2023.  She has responded well to iron and Aranesp.  Her hemoglobin is much better now..  The infection in the left face is also improved.  However, I still want to hold her [place] for right now.  She has had no problems with headache.  She has had no bleeding.  She has had no change in bowel or bladder habits.  She has had improved leg swelling.  Her appetite is good.  Overall, I would have to say that her performance status about ECOG 1.     Medications:  Current Outpatient Medications:    acetaminophen (TYLENOL) 500 MG tablet, Take 1 tablet (500 mg total) by mouth every 8  (eight) hours as needed for mild pain or moderate pain., Disp: 30 tablet, Rfl: 0   Albuterol-Budesonide 90-80 MCG/ACT AERO, Inhale 2 puffs into the lungs every 6 (six) hours as needed (wheezing, sob)., Disp: 10.7 g, Rfl: 1   aspirin EC 81 MG tablet, Take 81 mg by mouth in the morning. Swallow whole., Disp: , Rfl:    B Complex-C (SUPER B COMPLEX PO), Take 1 tablet by mouth daily with breakfast., Disp: , Rfl:    Calcium Carb-Cholecalciferol (CALCIUM + D3 PO), Take 1 tablet by mouth in the morning., Disp: , Rfl:    cholecalciferol (VITAMIN D3) 25 MCG (1000 UNIT) tablet, Take 1,000 Units by mouth in the morning., Disp: , Rfl:    COLLAGEN PO, Take 3 tablets by mouth in the morning and at bedtime. With Biotin, Disp: , Rfl:    Denosumab (XGEVA [place]), Inject into the skin every 3 (three) months., Disp: , Rfl:    Evolocumab (REPATHA SURECLICK) 140 MG/ML SOAJ, Inject 140 mg into the skin every 14 (fourteen) days., Disp: 6 mL, Rfl: 3   fexofenadine (ALLEGRA) 180 MG tablet, Take 180 mg by mouth in the morning., Disp: , Rfl:    folic acid (FOLATE) 400 MCG tablet, Take 400 mcg by mouth in the morning., Disp: , Rfl:    furosemide (LASIX) 20 MG  tablet, Take Lasix two tablets (20 mg each) once a day, Disp: 60 tablet, Rfl: 4   ibuprofen (ADVIL) 200 MG tablet, Take 600 mg by mouth daily as needed., Disp: , Rfl:    ipratropium (ATROVENT) 0.06 % nasal spray, Place 2 sprays into both nostrils 4 (four) times daily as needed for rhinitis., Disp: 15 mL, Rfl: 4   lorlatinib (LORBRENA) 100 MG tablet, Take 1 tablet (100 mg total) by mouth daily. Swallow tablets whole. Do not chew, crush or split tablets., Disp: 30 tablet, Rfl: 0   metFORMIN (GLUCOPHAGE) 500 MG tablet, TAKE TWO TABELTS AT LUNCH AND ONE TABLET AT DINNER, Disp: 270 tablet, Rfl: 0   NIFEdipine (PROCARDIA XL/NIFEDICAL XL) 60 MG 24 hr tablet, Take 1 tablet (60 mg total) by mouth daily., Disp: 90 tablet, Rfl: 1   pantoprazole (PROTONIX) 40 MG tablet, Take 1 tablet (40  mg total) by mouth daily., Disp: 90 tablet, Rfl: 0   spironolactone (ALDACTONE) 25 MG tablet, Take 1 tablet (25 mg total) by mouth daily., Disp: 90 tablet, Rfl: 3   traZODone (DESYREL) 50 MG tablet, TAKE 1 TABLET BY MOUTH EVERY NIGHT AT BEDTIME AS NEEDED FOR SLEEP (Patient taking differently: Take 50 mg by mouth at bedtime. for sleep), Disp: 90 tablet, Rfl: 2   valsartan (DIOVAN) 80 MG tablet, TAKE 1 TABLET BY MOUTH DAILY, Disp: 90 tablet, Rfl: 0  Allergies:  Allergies  Allergen Reactions   Diclofenac Hypertension   Amlodipine Swelling and Other (See Comments)    Limbs swell, not the throat   Lanolin Other (See Comments)    Sneezing and watery eyes. Allergic to [facility].   Tape Other (See Comments)    Redness (Bandaids also)    Past Medical History, Surgical history, Social history, and Family History were reviewed and updated.  Review of Systems: Review of Systems  Constitutional: Negative.   HENT:  Negative.    Eyes: Negative.   Respiratory: Negative.    Cardiovascular: Negative.   Gastrointestinal: Negative.   Endocrine: Negative.   Genitourinary: Negative.    Musculoskeletal:  Positive for arthralgias.  Skin: Negative.   Neurological: Negative.   Hematological: Negative.   Psychiatric/Behavioral: Negative.      Physical Exam: Vital signs show temperature of 98.3.  Pulse 66.  Blood pressure 135/58.  Weight is 184 pounds.   Wt Readings from Last 3 Encounters:  11/16/23 184 lb (83.5 kg)  11/09/23 185 lb (83.9 kg)  10/26/23 193 lb 6.4 oz (87.7 kg)    Physical Exam Vitals reviewed.  HENT:     Head: Normocephalic and atraumatic.  Eyes:     Pupils: Pupils are equal, round, and reactive to light.  Cardiovascular:     Rate and Rhythm: Normal rate and regular rhythm.     Heart sounds: Normal heart sounds.  Pulmonary:     Effort: Pulmonary effort is normal.     Breath sounds: Normal breath sounds.  Abdominal:     General: Bowel sounds are normal.     Palpations:  Abdomen is soft.  Musculoskeletal:        General: No tenderness or deformity. Normal range of motion.     Cervical back: Normal range of motion.     Comments: She has about 2+ edema in the lower legs.  I cannot palpate any venous cord.  She has a negative Homans' sign.  Lymphadenopathy:     Cervical: No cervical adenopathy.  Skin:    General: Skin is warm and dry.  Findings: No erythema or rash.  Neurological:     Mental Status: She is alert and oriented to person, place, and time.  Psychiatric:        Behavior: Behavior normal.        Thought Content: Thought content normal.        Judgment: Judgment normal.      Lab Results  Component Value Date   WBC 7.8 10/26/2023   HGB 11.2 (L) 10/26/2023   HCT 33.9 (L) 10/26/2023   MCV 99.7 10/26/2023   PLT 171 10/26/2023     Chemistry      Component Value Date/Time   NA 138 10/26/2023 0824   NA 142 07/17/2023 1358   NA 142 05/29/2017 1002   K 3.6 10/26/2023 0824   K 3.9 05/29/2017 1002   CL 99 10/26/2023 0824   CO2 30 10/26/2023 0824   CO2 28 05/29/2017 1002   BUN 17 10/26/2023 0824   BUN 26 07/17/2023 1358   BUN 13.5 05/29/2017 1002   CREATININE 0.92 10/26/2023 0824   CREATININE 0.81 04/06/2020 1059   CREATININE 0.8 05/29/2017 1002      Component Value Date/Time   CALCIUM 9.7 10/26/2023 0824   CALCIUM 9.4 05/29/2017 1002   ALKPHOS 63 10/26/2023 0824   ALKPHOS 39 (L) 05/29/2017 1002   AST 25 10/26/2023 0824   AST 18 05/29/2017 1002   ALT 29 10/26/2023 0824   ALT 19 05/29/2017 1002   BILITOT 0.5 10/26/2023 0824   BILITOT 0.48 05/29/2017 1002       Impression and Plan: Laura Bush is a very charming 73 year old white female.  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  This was actually a small lesion.  It would be very interesting to see what the Orthopedist at [facility] says.  It sounds like she may need to have surgery to repair this area.  Again, we will go hold on the [employer] today..  She  is doing well on the [place]  I had to believe that this is going to work.  I would like to get her back in about a month.  We will see how things are going at that point.  Maybe, she would have even had had surgery for the right hip if needed.  From my perspective, I do not see a problem with her having surgery.  We can certainly hold the Lorbrena if necessary.  Josph Macho, MD 4/10/20259:05 AM

## 2023-11-21 ENCOUNTER — Encounter (INDEPENDENT_AMBULATORY_CARE_PROVIDER_SITE_OTHER): Payer: Self-pay | Admitting: Family Medicine

## 2023-11-21 DIAGNOSIS — E88819 Insulin resistance, unspecified: Secondary | ICD-10-CM

## 2023-11-22 ENCOUNTER — Encounter: Payer: Self-pay | Admitting: *Deleted

## 2023-11-22 DIAGNOSIS — E88819 Insulin resistance, unspecified: Secondary | ICD-10-CM

## 2023-11-22 MED ORDER — METFORMIN HCL 500 MG PO TABS: ORAL_TABLET | ORAL | 3 refills | Status: AC

## 2023-11-22 NOTE — Telephone Encounter (Signed)

## 2023-11-22 NOTE — Addendum Note (Signed)
 Addended by: Gates Kasal C on: 11/22/2023 01:29 PM   Modules accepted: Orders

## 2023-11-24 ENCOUNTER — Other Ambulatory Visit: Payer: Self-pay | Admitting: Hematology & Oncology

## 2023-11-24 ENCOUNTER — Other Ambulatory Visit: Payer: Self-pay

## 2023-11-24 DIAGNOSIS — C3490 Malignant neoplasm of unspecified part of unspecified bronchus or lung: Secondary | ICD-10-CM

## 2023-11-24 MED ORDER — LORLATINIB 100 MG PO TABS
100.0000 mg | ORAL_TABLET | Freq: Every day | ORAL | 0 refills | Status: DC
  Filled 2023-11-24: qty 30, 30d supply, fill #0

## 2023-11-24 NOTE — Progress Notes (Signed)
 Specialty Pharmacy Ongoing Clinical Assessment Note  Laura Bush is a 73 y.o. female who is being followed by the specialty pharmacy service for RxSp Oncology   Patient's specialty medication(s) reviewed today: Lorlatinib  (LORBRENA )   Missed doses in the last 4 weeks: 4   Patient/Caregiver did not have any additional questions or concerns.   Therapeutic benefit summary: Unable to assess   Adverse events/side effects summary: No adverse events/side effects   Patient's therapy is appropriate to: Continue    Goals Addressed             This Visit's Progress    Slow Disease Progression       Patient is unable to be assessed as therapy was recently initiated. Patient will maintain adherence          Follow up:  3 months  Palermo Cohick M Ebin Palazzi Specialty Pharmacist

## 2023-11-24 NOTE — Progress Notes (Signed)
 Specialty Pharmacy Refill Coordination Note  Laura Bush is a 73 y.o. female contacted today regarding refills of specialty medication(s) Lorlatinib  (LORBRENA )   Patient requested Delivery   Delivery date: 12/01/23   Verified address: [address] [facility] [place], [address]   Medication will be filled on 11/30/23.

## 2023-11-25 ENCOUNTER — Other Ambulatory Visit (HOSPITAL_BASED_OUTPATIENT_CLINIC_OR_DEPARTMENT_OTHER): Payer: Self-pay

## 2023-11-29 DIAGNOSIS — C7951 Secondary malignant neoplasm of bone: Secondary | ICD-10-CM | POA: Diagnosis not present

## 2023-11-29 DIAGNOSIS — M84551D Pathological fracture in neoplastic disease, right femur, subsequent encounter for fracture with routine healing: Secondary | ICD-10-CM | POA: Diagnosis not present

## 2023-11-30 ENCOUNTER — Other Ambulatory Visit: Payer: Self-pay

## 2023-11-30 ENCOUNTER — Encounter: Payer: Self-pay | Admitting: Hematology & Oncology

## 2023-11-30 DIAGNOSIS — C7951 Secondary malignant neoplasm of bone: Secondary | ICD-10-CM

## 2023-12-05 NOTE — Progress Notes (Signed)
 Radiation Oncology         [phone] ________________________________  Initial Out patient Re-Consultation  Name: Laura Bush MRN: [id number]  Date: 12/07/2023  DOB: 05-Feb-1951  EX:BMWUXLK, Skipper Dumas, MD  Ivor Mars, MD   REFERRING PHYSICIAN: Ivor Mars, MD  DIAGNOSIS: The primary encounter diagnosis was Secondary malignant neoplasm of bone and bone marrow (HCC). 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   HISTORY OF PRESENT ILLNESS::Laura Bush is a 73 y.o. female who is accompanied by her supportive husband. she is seen as a courtesy of Dr. Maria Shiner for an opinion concerning radiation therapy as part of management for her recurrent metastatic bone cancer with lung cancer origin. Patient was last seen in office on 04/07/22 for a routine follow up.    Patient is well known to us  as she previously received radiation therapy to her right femur (completed on 02/24/2022) after her retrograde IMN for a metastatic bone lesion and SBRT to her LUL (completed on 03/04/2022).   Patient presented for a restaging PET scan on 09/25/23 which showed an enlarging sclerotic lesion measuring 21 x 19 mm in the lower right femoral neck that is worrisome for the possibility of tumor progression. Scan also indicated a new 14 mm ground-glass nodule in the right upper lobe anteriorly.   To further investigate findings, she underwent a right hip MRI on 10/25/23 showing a progressive sclerotic moderately hypermetabolic lesion in the right anterior junction of the femoral neck and greater trochanter is favored to represent a metastatic focus, with chronic infection and stress fracture as less likely differential diagnostic considerations. A right hip CT on 10/29/23 confirmed a large sclerotic lesion  involving the right femur located at the junction between the greater trochanter and the lower femoral neck.  Other pertinent imaging includes: -- Bilateral screening mammogram on 10/10/23 showing no evidence of  malignancy.   --CT Head on 10/23/23 showing no acute intracranial abnormality.  To further investigate her hip pain, patient was referred to Dr. Marylene Snuffer at [facility] orthopedic on 11/29/23 who recommended additional radiation to this region and antiresorptive therapy to prevent SREs.          Right femur and pelvis x-ray preformed on 11/29/23 showed a small sclerotic lesion within the intertrochanteric femur could represent metastatic disease and a healing fracture of the right femur status post ORIF.    Of note, she had a tooth abscess with root canal and her recent scheduled Xgeva  dose was held, however has not had any infectious symptoms since her tooth abscess resolved.   Patient reports to be doing well overall today.  She notes intermittent right-sided hip pain, but is unable to remember when this started.  She has been taking ibuprofen/Tylenol  to help manage her pain.  She uses a cane for ambulation.  She also notes some weakness in her right thigh since the surgery.  She denies any numbness or tingling.  PREVIOUS RADIATION THERAPY: Yes  02/16/2022 through 03/04/2022 Site Technique Total Dose (Gy) Dose per Fx (Gy) Completed Fx Beam Energies  Lung, Left: Lung_L IMRT 54/54 18 3/3 6XFFF  Femur Right: Ext_R 3D 24/24 3 8/8 10X  Femur Right: Ext_R_Bst IMRT 15/15 3 5/5 6X   PAST MEDICAL HISTORY:  Past Medical History:  Diagnosis Date   Allergy  seasonal   Anemia associated with stage 3 chronic renal failure (HCC) 04/18/2023   Arthritis Last several years   Mostly in hands, wrists, shoulders, knees   Asthma    in cold weather   Breast cancer (HCC) 09/18/2014   left breast   Breast cancer (HCC) 10/07/2014   bx left breast   Breast cancer of upper-outer quadrant of left female breast  (HCC) 09/22/2014   Cluster headaches    in her 40's   GERD (gastroesophageal reflux disease)    Goals of care, counseling/discussion 01/14/2022   Heart murmur    as a child (has outgrown)   High cholesterol    History of radiation therapy    Left lung, Right Femur- 02/16/22-03/04/22- Dr. 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"   Stroke [facility]) summer 2022   described as tiny   Swallowing difficulty    Venous insufficiency     PAST SURGICAL HISTORY: Past Surgical History:  Procedure Laterality Date   ABDOMINAL HYSTERECTOMY  1999   APPENDECTOMY  ~ 2006   BONE BIOPSY Right 01/04/2022   Procedure: RIGHT FEMORAL BONE BIOPSY;  Surgeon: Hardy Lia, MD;  Location: [facility] OR;  Service: Orthopedics;  Laterality: Right;   BONE EXCISION Right 04/11/2023   Procedure: BONE EXCISION;  Surgeon: Hardy Lia, MD;  Location: [facility] OR;  Service: Orthopedics;  Laterality: Right;   BREAST BIOPSY Left 10/2014   BREAST LUMPECTOMY Left 2016   BREAST REDUCTION SURGERY Bilateral 11/11/2014   Procedure: Bilateral Breast Reduction;  Surgeon: Elidia Grout, MD;  Location: [facility] OR;  Service: Plastics;  Laterality: Bilateral;   BRONCHIAL BIOPSY  01/06/2022   Procedure: BRONCHIAL BIOPSIES;  Surgeon: Denson Flake, MD;  Location: [facility] ENDOSCOPY;  Service: Pulmonary;;   BRONCHIAL BRUSHINGS  01/06/2022   Procedure: BRONCHIAL BRUSHINGS;  Surgeon: Denson Flake, MD;  Location: [facility] ENDOSCOPY;  Service: Pulmonary;;   BRONCHIAL NEEDLE ASPIRATION BIOPSY   01/06/2022   Procedure: BRONCHIAL NEEDLE ASPIRATION BIOPSIES;  Surgeon: Denson Flake, MD;  Location: [facility] ENDOSCOPY;  Service: Pulmonary;;   BRONCHIAL WASHINGS  01/06/2022   Procedure: BRONCHIAL WASHINGS;  Surgeon: Denson Flake, MD;  Location: [facility] ENDOSCOPY;  Service: Pulmonary;;   CARPAL TUNNEL RELEASE Bilateral    2 surgeries on right, 1 on left   CESAREAN SECTION  1978; 1980   COLONOSCOPY     FEMUR IM NAIL Right 01/04/2022   Procedure: RIGHT FEMORAL INTRAMEDULLARY (IM) NAIL;  Surgeon: Hardy Lia, MD;  Location: [facility] OR;  Service: Orthopedics;  Laterality: Right;   FIDUCIAL MARKER PLACEMENT  01/06/2022   Procedure: FIDUCIAL MARKER PLACEMENT;  Surgeon: Denson Flake, MD;  Location: [facility] ENDOSCOPY;  Service: Pulmonary;;   FOREARM FRACTURE SURGERY Right 2003 X 3   FRACTURE SURGERY  2003, 2023   3 surgeries, right arm -03; Femur-23   HARDWARE REMOVAL Right 04/11/2023   Procedure: HARDWARE REMOVAL;  Surgeon: Hardy Lia, MD;  Location: [facility] OR;  Service: Orthopedics;  Laterality: Right;   KNEE ARTHROSCOPY Bilateral    meniscus repair   PARTIAL MASTECTOMY WITH NEEDLE LOCALIZATION AND AXILLARY SENTINEL LYMPH NODE BX Left 11/11/2014   Procedure: LEFT BREAST PARTIAL MASTECTOMY WITH NEEDLE LOCALIZATION TIMES TWO AND LEFT AXILLARY SENTINEL LYMPH NODE Biopsy;  Surgeon: Boyce Byes, MD;  Location: [facility] OR;  Service: General;  Laterality: Left;   REDUCTION MAMMAPLASTY Bilateral 2016   SQUAMOUS CELL CARCINOMA EXCISION  1980's X 1   "nose"   TONSILLECTOMY  ~ 1957   TUBAL LIGATION  ?1984    FAMILY HISTORY:  Family History  Problem Relation Age of Onset   Dementia Mother        Lives in [place]   Hypertension Mother    Hyperlipidemia Mother    Stroke Father        deceased   Heart failure Father 52   Hypertension Father    Uterine cancer Sister 45   Heart attack Maternal Grandmother    Hypertension Maternal Grandfather    Stroke Paternal Grandfather    Colon cancer Neg Hx     Esophageal cancer Neg Hx    Pancreatic cancer Neg Hx    Stomach cancer Neg Hx    Liver disease Neg Hx    Sleep apnea Neg Hx     SOCIAL HISTORY:  Social History   Tobacco Use   Smoking status: Never   Smokeless tobacco: Never  Vaping Use   Vaping status: Never Used  Substance Use Topics   Alcohol use: Yes    Alcohol/week: 2.0 standard drinks of alcohol    Types: 2 Glasses of wine per week    Comment: 2 glasses a week   Drug use: No    ALLERGIES:  Allergies  Allergen Reactions   Diclofenac  Hypertension   Amlodipine  Swelling and Other (See Comments)    Limbs swell, not the throat   Lanolin Other (See Comments)    Sneezing and watery eyes. Allergic to Wool.   Tape Other (See Comments)    Redness (Bandaids also)    MEDICATIONS:  Current Outpatient Medications  Medication Sig Dispense Refill   acetaminophen  (TYLENOL ) 500 MG tablet Take 1 tablet (500 mg total) by mouth every 8 (eight) hours as needed for mild pain or moderate pain. 30 tablet 0   Albuterol -Budesonide  90-80 MCG/ACT AERO Inhale 2 puffs into the lungs every 6 (six) hours as needed (wheezing, sob). 10.7 g 1   aspirin EC 81 MG tablet Take 81 mg by mouth in the morning. Swallow whole.     B Complex-C (SUPER B COMPLEX PO) Take 1 tablet by mouth daily with breakfast.     Calcium  Carb-Cholecalciferol (CALCIUM  + D3 PO) Take 1 tablet by mouth in the morning.     cholecalciferol (VITAMIN D3) 25 MCG (1000 UNIT) tablet Take 1,000 Units by mouth in the morning.     COLLAGEN PO Take 3 tablets by mouth in the morning and at bedtime. With Biotin     Denosumab  (XGEVA  [place]) Inject into the skin every 3 (three) months.     Evolocumab  (REPATHA  SURECLICK) 140 MG/ML SOAJ Inject 140 mg into the skin every 14 (fourteen) days. 6 mL 3   fexofenadine (ALLEGRA) 180 MG tablet Take 180 mg by mouth in the morning.     folic acid  (FOLATE) 400 MCG tablet Take 400 mcg by mouth in the morning.     furosemide  (LASIX ) 20 MG tablet Take Lasix  two  tablets (  20 mg each) once a day 60 tablet 4   ibuprofen (ADVIL) 200 MG tablet Take 600 mg by mouth daily as needed.     ipratropium (ATROVENT ) 0.06 % nasal spray Place 2 sprays into both nostrils 4 (four) times daily as needed for rhinitis. 15 mL 4   lorlatinib  (LORBRENA ) 100 MG tablet Take 1 tablet (100 mg total) by mouth daily. Swallow tablets whole. Do not chew, crush or split tablets. 30 tablet 0   metFORMIN  (GLUCOPHAGE ) 500 MG tablet Take 1 tablet by mouth daily 90 tablet 3   NIFEdipine  (PROCARDIA  XL/NIFEDICAL XL) 60 MG 24 hr tablet Take 1 tablet (60 mg total) by mouth daily. 90 tablet 1   pantoprazole  (PROTONIX ) 40 MG tablet Take 1 tablet (40 mg total) by mouth daily. 90 tablet 0   spironolactone  (ALDACTONE ) 25 MG tablet Take 1 tablet (25 mg total) by mouth daily. 90 tablet 3   traZODone  (DESYREL ) 50 MG tablet TAKE 1 TABLET BY MOUTH EVERY NIGHT AT BEDTIME AS NEEDED FOR SLEEP (Patient taking differently: Take 50 mg by mouth at bedtime. for sleep) 90 tablet 2   valsartan  (DIOVAN ) 80 MG tablet TAKE 1 TABLET BY MOUTH DAILY 90 tablet 0   No current facility-administered medications for this encounter.    REVIEW OF SYSTEMS: Notable for that above.   PHYSICAL EXAM:  height is 4' 10.5" (1.486 m) and weight is 191 lb 3.2 oz (86.7 kg). Her oral temperature is 98 F (36.7 C). Her blood pressure is 148/80 (abnormal) and her pulse is 80. Her respiration is 18 and oxygen saturation is 98%.   General: Alert and oriented, in no acute distress HEENT: Head is normocephalic. Extraocular movements are intact. Oropharynx is clear. Neck: Neck is supple, no palpable cervical or supraclavicular lymphadenopathy. Heart: Regular in rate and rhythm with no murmurs, rubs, or gallops. Chest: Clear to auscultation bilaterally, with no rhonchi, wheezes, or rales. Abdomen: Soft, nontender, nondistended, with no rigidity or guarding. Extremities: No cyanosis or edema. Lymphatics: see Neck Exam Skin: No concerning  lesions. Musculoskeletal: symmetric strength and muscle tone throughout.  Using a cane as needed. Neurologic: Cranial nerves II through XII are grossly intact. No obvious focalities. Speech is fluent. Coordination is intact. Psychiatric: Judgment and insight are intact. Affect is appropriate.   ECOG = 1  0 - Asymptomatic (Fully active, able to carry on all predisease activities without restriction)  1 - Symptomatic but completely ambulatory (Restricted in physically strenuous activity but ambulatory and able to carry out work of a light or sedentary nature. For example, light housework, office work)  2 - Symptomatic, <50% in bed during the day (Ambulatory and capable of all self care but unable to carry out any work activities. Up and about more than 50% of waking hours)  3 - Symptomatic, >50% in bed, but not bedbound (Capable of only limited self-care, confined to bed or chair 50% or more of waking hours)  4 - Bedbound (Completely disabled. Cannot carry on any self-care. Totally confined to bed or chair)  5 - Death   Aurea Blossom MM, Creech RH, Tormey DC, et al. [phone]). "Toxicity and response criteria of the [facility]". Am. Hillard [employer]. Oncol. 5 (6): 649-55  LABORATORY DATA:  Lab Results  Component Value Date   WBC 5.0 11/16/2023   HGB 12.6 11/16/2023   HCT 37.8 11/16/2023   MCV 98.4 11/16/2023   PLT 210 11/16/2023   NEUTROABS 2.9 11/16/2023   Lab Results  Component Value  Date   NA 141 11/16/2023   K 3.9 11/16/2023   CL 102 11/16/2023   CO2 29 11/16/2023   GLUCOSE 98 11/16/2023   BUN 26 (H) 11/16/2023   CREATININE 1.02 (H) 11/16/2023   CALCIUM  10.8 (H) 11/16/2023      RADIOGRAPHY: No results found.    IMPRESSION: Stage IV (cT1b, cNX, pM1) metastatic adenocarcinoma of the left lung (LLL); now osseous metastasis to the right femur  We personally reviewed this patient's current work-up. MRI of the hip demonstrates a progressive sclerotic lesion in the right  hip, highly concerning for progressive metastatic disease. She is s/p retrograde IMN and adjuvant radiation to the mid-femur completed on 02/24/2022. Patient has met with orthopaedic oncologist, Michaela Adie, who is not recommending surgery due to the fact that this lesion is blastic and patient is fortunately asymptomatic. Dr. Eloise Hake has reviewed her previous treatment plans. She is a good candidate for radiation to this area to help prevent fracture in the future.  Today, we talked to the patient and family about the findings and work-up thus far.  We discussed the natural history of osseous metastases and general treatment, highlighting the role of radiotherapy in the management.  We discussed the available radiation techniques, and focused on the details of logistics and delivery.  We reviewed the anticipated acute and late sequelae associated with radiation in this setting.  The patient was encouraged to ask questions that I answered to the best of my ability. A patient consent form was discussed and signed.  We retained a copy for our records.  The patient would like to proceed with radiation and will be scheduled for CT simulation.  PLAN: Patient is scheduled for CT simulation on 12/13/2023.  Anticipate 30 Gy in 10 fractions to the right hip.  We look forward to again partaking in this patient's care.  She was encouraged to call with any questions or concerns in the meantime.  Continue on systemic treatment under the care of Dr. Maria Shiner.   60 minutes of total time was spent for this patient encounter, including preparation, face-to-face counseling with the patient and coordination of care, physical exam, and documentation of the encounter.   ------------------------------------------------   Julio Ohm, PA-C   Noralee Beam, PhD, MD   [facility]  Radiation Oncology Direct Dial: [phone]  Fax: [phone] [email or web address]     This document serves as a record of services  personally performed by Retta Caster, MD. It was created on his behalf by Lucky Sable, a trained medical scribe. The creation of this record is based on the scribe's personal observations and the provider's statements to them. This document has been checked and approved by the attending provider.

## 2023-12-06 NOTE — Progress Notes (Addendum)
 Histology and Location of Primary Cancer:  Left lower lung with bone mets   Sites of Visceral and Bony Metastatic Disease: Right femoral neck lesion  Location(s) of Symptomatic Metastases:  Past/Anticipated chemotherapy by medical oncology, if any: Dr. Maria Shiner    Pain on a scale of 0-10 is: Not applicable    If Spine Met(s), symptoms, if any, include: Bowel/Bladder retention or incontinence (please describe): Denies Numbness or weakness in extremities (please describe): She reports numbness in right thigh. Current Decadron  regimen, if applicable:N/A  Ambulatory status? Walker? Wheelchair?: Ambulatory  SAFETY ISSUES: Prior radiation? No Pacemaker/ICD? No Possible current pregnancy? No Is the patient on methotrexate? No  Current Complaints / other details:   She is concerned if she can have more radition to right leg.  BP (!) 148/80 (BP Location: Left Arm, Patient Position: Sitting)   Pulse 80   Temp 98 F (36.7 C) (Oral)   Resp 18   Ht 4' 10.5" (1.486 m)   Wt 191 lb 3.2 oz (86.7 kg)   SpO2 98%   BMI 39.28 kg/m

## 2023-12-07 ENCOUNTER — Ambulatory Visit
Admission: RE | Admit: 2023-12-07 | Discharge: 2023-12-07 | Disposition: A | Discharge status: Home / Self Care | Source: Ambulatory Visit | Attending: Radiation Oncology | Admitting: Radiation Oncology

## 2023-12-07 ENCOUNTER — Ambulatory Visit: Admitting: Radiation Oncology

## 2023-12-07 ENCOUNTER — Encounter: Payer: Self-pay | Admitting: Radiation Oncology

## 2023-12-07 ENCOUNTER — Ambulatory Visit

## 2023-12-07 VITALS — BP 148/80 | HR 80 | Temp 98.0°F | Resp 18 | Ht 58.5 in | Wt 191.2 lb

## 2023-12-07 DIAGNOSIS — K219 Gastro-esophageal reflux disease without esophagitis: Secondary | ICD-10-CM | POA: Insufficient documentation

## 2023-12-07 DIAGNOSIS — R011 Cardiac murmur, unspecified: Secondary | ICD-10-CM | POA: Diagnosis not present

## 2023-12-07 DIAGNOSIS — I129 Hypertensive chronic kidney disease with stage 1 through stage 4 chronic kidney disease, or unspecified chronic kidney disease: Secondary | ICD-10-CM | POA: Insufficient documentation

## 2023-12-07 DIAGNOSIS — C7951 Secondary malignant neoplasm of bone: Secondary | ICD-10-CM | POA: Insufficient documentation

## 2023-12-07 DIAGNOSIS — C50212 Malignant neoplasm of upper-inner quadrant of left female breast: Secondary | ICD-10-CM | POA: Diagnosis not present

## 2023-12-07 DIAGNOSIS — E7800 Pure hypercholesterolemia, unspecified: Secondary | ICD-10-CM | POA: Diagnosis not present

## 2023-12-07 DIAGNOSIS — D631 Anemia in chronic kidney disease: Secondary | ICD-10-CM | POA: Insufficient documentation

## 2023-12-07 DIAGNOSIS — N183 Chronic kidney disease, stage 3 unspecified: Secondary | ICD-10-CM | POA: Diagnosis not present

## 2023-12-07 DIAGNOSIS — E669 Obesity, unspecified: Secondary | ICD-10-CM | POA: Diagnosis not present

## 2023-12-07 DIAGNOSIS — Z8673 Personal history of transient ischemic attack (TIA), and cerebral infarction without residual deficits: Secondary | ICD-10-CM | POA: Insufficient documentation

## 2023-12-07 DIAGNOSIS — Z79899 Other long term (current) drug therapy: Secondary | ICD-10-CM | POA: Insufficient documentation

## 2023-12-07 DIAGNOSIS — I872 Venous insufficiency (chronic) (peripheral): Secondary | ICD-10-CM | POA: Insufficient documentation

## 2023-12-07 DIAGNOSIS — C3432 Malignant neoplasm of lower lobe, left bronchus or lung: Secondary | ICD-10-CM | POA: Insufficient documentation

## 2023-12-07 DIAGNOSIS — M25551 Pain in right hip: Secondary | ICD-10-CM | POA: Diagnosis not present

## 2023-12-07 DIAGNOSIS — J45909 Unspecified asthma, uncomplicated: Secondary | ICD-10-CM | POA: Diagnosis not present

## 2023-12-07 DIAGNOSIS — C3490 Malignant neoplasm of unspecified part of unspecified bronchus or lung: Secondary | ICD-10-CM

## 2023-12-07 DIAGNOSIS — Z7984 Long term (current) use of oral hypoglycemic drugs: Secondary | ICD-10-CM | POA: Diagnosis not present

## 2023-12-07 DIAGNOSIS — Z923 Personal history of irradiation: Secondary | ICD-10-CM | POA: Diagnosis not present

## 2023-12-07 NOTE — Addendum Note (Signed)
 Encounter addended by: Jena Minor, LPN on: 04/15/1190 2:54 PM  Actions taken: Visit diagnoses modified

## 2023-12-13 ENCOUNTER — Ambulatory Visit
Admission: RE | Admit: 2023-12-13 | Discharge: 2023-12-13 | Disposition: A | Discharge status: Home / Self Care | Source: Ambulatory Visit | Attending: Radiation Oncology | Admitting: Radiation Oncology

## 2023-12-13 DIAGNOSIS — C7951 Secondary malignant neoplasm of bone: Secondary | ICD-10-CM | POA: Insufficient documentation

## 2023-12-13 DIAGNOSIS — C7952 Secondary malignant neoplasm of bone marrow: Secondary | ICD-10-CM | POA: Diagnosis not present

## 2023-12-13 DIAGNOSIS — Z510 Encounter for antineoplastic radiation therapy: Secondary | ICD-10-CM | POA: Insufficient documentation

## 2023-12-13 DIAGNOSIS — C3490 Malignant neoplasm of unspecified part of unspecified bronchus or lung: Secondary | ICD-10-CM

## 2023-12-13 DIAGNOSIS — C3432 Malignant neoplasm of lower lobe, left bronchus or lung: Secondary | ICD-10-CM | POA: Diagnosis not present

## 2023-12-18 ENCOUNTER — Other Ambulatory Visit: Payer: Self-pay

## 2023-12-19 ENCOUNTER — Other Ambulatory Visit (HOSPITAL_COMMUNITY): Payer: Self-pay

## 2023-12-19 ENCOUNTER — Ambulatory Visit (HOSPITAL_BASED_OUTPATIENT_CLINIC_OR_DEPARTMENT_OTHER): Admitting: Family

## 2023-12-19 ENCOUNTER — Other Ambulatory Visit: Payer: Self-pay

## 2023-12-19 ENCOUNTER — Encounter (HOSPITAL_BASED_OUTPATIENT_CLINIC_OR_DEPARTMENT_OTHER): Payer: Self-pay | Admitting: Family

## 2023-12-19 ENCOUNTER — Other Ambulatory Visit: Payer: Self-pay | Admitting: Pharmacy Technician

## 2023-12-19 ENCOUNTER — Other Ambulatory Visit: Payer: Self-pay | Admitting: Hematology & Oncology

## 2023-12-19 VITALS — BP 132/80 | HR 58 | Ht 58.5 in | Wt 192.0 lb

## 2023-12-19 DIAGNOSIS — C7952 Secondary malignant neoplasm of bone marrow: Secondary | ICD-10-CM | POA: Diagnosis not present

## 2023-12-19 DIAGNOSIS — E785 Hyperlipidemia, unspecified: Secondary | ICD-10-CM

## 2023-12-19 DIAGNOSIS — C3490 Malignant neoplasm of unspecified part of unspecified bronchus or lung: Secondary | ICD-10-CM

## 2023-12-19 DIAGNOSIS — I25118 Atherosclerotic heart disease of native coronary artery with other forms of angina pectoris: Secondary | ICD-10-CM | POA: Diagnosis not present

## 2023-12-19 DIAGNOSIS — I517 Cardiomegaly: Secondary | ICD-10-CM | POA: Diagnosis not present

## 2023-12-19 DIAGNOSIS — I10 Essential (primary) hypertension: Secondary | ICD-10-CM

## 2023-12-19 DIAGNOSIS — Z510 Encounter for antineoplastic radiation therapy: Secondary | ICD-10-CM | POA: Diagnosis not present

## 2023-12-19 DIAGNOSIS — C7951 Secondary malignant neoplasm of bone: Secondary | ICD-10-CM | POA: Diagnosis not present

## 2023-12-19 DIAGNOSIS — C3432 Malignant neoplasm of lower lobe, left bronchus or lung: Secondary | ICD-10-CM | POA: Diagnosis not present

## 2023-12-19 MED ORDER — LORLATINIB 100 MG PO TABS
100.0000 mg | ORAL_TABLET | Freq: Every day | ORAL | 0 refills | Status: DC
Start: 2023-12-19 — End: 2024-01-30
  Filled 2023-12-19 (×2): qty 30, 30d supply, fill #0

## 2023-12-19 NOTE — Progress Notes (Signed)
 Cardiology Office Note:  .   Date:  12/19/2023  ID:  Laura Bush, DOB 1951-06-21, MRN [id number] PCP: Kaylee Partridge, MD  [facility] Providers Cardiologist:  Maudine Sos, MD    History of Present Illness: .   Laura Bush is a 73 y.o. 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  Since 2017 with progressive exertional dyspnea.  Exercise Myoview  negative for ischemia and LVEF 71%.  Metoprolol  previously switched to carvedilol  for blood pressure control.  Amlodipine  discontinued due to lower extremity edema.  HCTZ increased due to poorly controlled blood pressure.  Summer 2023 she was diagnosed with stage IV lung cancer and is now in remission.  She was seen 12/21/2022 by Dr. Theodis Fiscal noting chest pain and exertional dyspnea.  She noted decreased exercise related to fractured femur requiring titanium rod placement and subsequent struggle with lymphedema.  He was no longer on Ozempic  due to bruising.  Due to known coronary calcification on prior imaging simvastatin  was transitioned to rosuvastatin .  Nifedipine  was stopped due to concern was contributory to lymphedema and she was started on valsartan  80 mg daily.  Coronary CTA 12/29/2022 with coronary calcium  score of 242 placing her in the 83rd percentile with overall minimal plaque in the LAD, LCx, OM1. Echo 01/18/2023 normal LVEF 60 to 65%, grade 1 diastolic dysfunction, no RWMA, no significant valvular abnormalities.  At visit 01/11/2023 her blood pressure was well-controlled, LE edema improved with transition from nifedipine  to valsartan  and she was referred to the PREP exercise program.  When seen by Dr. Theodis Fiscal 07/03/2023 potassium hydrochlorothiazide  stopped and spironolactone  25 mg daily initiated.  She was also recommended for echocardiogram monitoring of LVH which is scheduled for  01/15/2024.  Seen by pharmacy team 07/17/2023 noting more Raynaud's flares.  Valsartan  stopped and nifedipine  30 mg daily initiated.  At visit 2//25 BP was controlled and nifedipine  30 mg daily, spironolactone  20 mg daily were continued.  Since that time due to elevated blood pressures nifedipine  increased to 60 mg daily and valsartan  80 mg daily resumed.  Presents today for follow-up. Excited for upcoming trip to [place]  and planning train ride to see the sites,. Blood pressure at home has been well controlled 120s-140s. Reports no shortness of breath nor dyspnea on exertion. Occasional asthma symptoms which is overall not bothersome. Reports no chest pain, pressure, or tightness. No  orthopnea, PND. Reports no palpitations.  Does note LE edema which she attributes to her present cancer medications. Does try to wear compression stockings. Upcoming right hip radiation treatments. Per her oncology team, she is taking Lasix  two 20mg  tablets daily and additional tablet PRN.   ROS: Please see the history of present illness.    All other systems reviewed and are negative.   Studies Reviewed: .        Cardiac Studies & Procedures   ______________________________________________________________________________________________     ECHOCARDIOGRAM  ECHOCARDIOGRAM COMPLETE 01/18/2023  Narrative ECHOCARDIOGRAM REPORT    Patient Name:   Laura Bush Date of Exam: 01/18/2023 Medical Rec #:  [id number]            Height:       48.0 in Accession #:    [id number]           Weight:       181.2 lb Date of Birth:  1951-05-07            BSA:  1.523 m Patient Age:    71 years             BP:           130/60 mmHg Patient Gender: F                    HR:           62 bpm. Exam Location:  [place]  Procedure: 2D Echo, Cardiac Doppler, Color Doppler and Intracardiac Opacification Agent  Indications:    R06.9 DOE; R60.0 Lower extremity edema  History:        Patient has prior history of  Echocardiogram examinations, most recent 03/03/2021. CAD, Signs/Symptoms:Dyspnea and Edema; Risk Factors:Hypertension, Dyslipidemia and Sleep Apnea. Patient denies chest pain. She does have DOE with bilateral leg edema. Patient undergoing chemotherapy for lung cancer.  Sonographer:    Richarda Chance RVT, RDCS (AE), RDMS Referring Phys: [phone] [facility]   Sonographer Comments: Suboptimal apical window and patient is obese. Image acquisition challenging due to patient body habitus. IMPRESSIONS   1. Left ventricular ejection fraction, by estimation, is 60 to 65%. The left ventricle has normal function. The left ventricle has no regional wall motion abnormalities. Left ventricular diastolic parameters are consistent with Grade I diastolic dysfunction (impaired relaxation). 2. Right ventricular systolic function is normal. The right ventricular size is moderately enlarged. 3. The mitral valve is normal in structure. No evidence of mitral valve regurgitation. No evidence of mitral stenosis. 4. The aortic valve is normal in structure. Aortic valve regurgitation is not visualized. No aortic stenosis is present. 5. The inferior vena cava is normal in size with greater than 50% respiratory variability, suggesting right atrial pressure of 3 mmHg.  Comparison(s): EF 60%, mild LVH.  FINDINGS Left Ventricle: Left ventricular ejection fraction, by estimation, is 60 to 65%. The left ventricle has normal function. The left ventricle has no regional wall motion abnormalities. Definity  contrast agent was given IV to delineate the left ventricular endocardial borders. The left ventricular internal cavity size was normal in size. There is no left ventricular hypertrophy. Left ventricular diastolic parameters are consistent with Grade I diastolic dysfunction (impaired relaxation).  Right Ventricle: The right ventricular size is moderately enlarged. No increase in right ventricular wall thickness. Right  ventricular systolic function is normal.  Left Atrium: Left atrial size was normal in size.  Right Atrium: Right atrial size was normal in size.  Pericardium: There is no evidence of pericardial effusion.  Mitral Valve: The mitral valve is normal in structure. No evidence of mitral valve regurgitation. No evidence of mitral valve stenosis.  Tricuspid Valve: The tricuspid valve is normal in structure. Tricuspid valve regurgitation is mild . No evidence of tricuspid stenosis.  Aortic Valve: The aortic valve is normal in structure. Aortic valve regurgitation is not visualized. No aortic stenosis is present. Aortic valve mean gradient measures 7.0 mmHg. Aortic valve peak gradient measures 15.1 mmHg. Aortic valve area, by VTI measures 2.10 cm.  Pulmonic Valve: The pulmonic valve was normal in structure. Pulmonic valve regurgitation is not visualized. No evidence of pulmonic stenosis.  Aorta: The aortic root is normal in size and structure.  Venous: The inferior vena cava is normal in size with greater than 50% respiratory variability, suggesting right atrial pressure of 3 mmHg.  IAS/Shunts: No atrial level shunt detected by color flow Doppler.   LEFT VENTRICLE PLAX 2D LVIDd:         4.20 cm  Diastology LVIDs:         2.60 cm      LV e' medial:    7.07 cm/s LV PW:         1.60 cm      LV E/e' medial:  8.2 LV IVS:        1.20 cm      LV e' lateral:   7.72 cm/s LVOT diam:     1.80 cm      LV E/e' lateral: 7.5 LV SV:         67 LV SV Index:   44 LVOT Area:     2.54 cm  LV Volumes (MOD) LV vol d, MOD A2C: 82.6 ml LV vol d, MOD A4C: 116.0 ml LV vol s, MOD A2C: 22.8 ml LV vol s, MOD A4C: 38.8 ml LV SV MOD A2C:     59.8 ml LV SV MOD A4C:     116.0 ml LV SV MOD BP:      73.0 ml  RIGHT VENTRICLE RV S prime:     22.70 cm/s TAPSE (M-mode): 3.0 cm  LEFT ATRIUM             Index        RIGHT ATRIUM           Index LA diam:        4.00 cm 2.63 cm/m   RA Area:     12.60 cm LA Vol  (A2C):   39.1 ml 25.68 ml/m  RA Volume:   33.00 ml  21.67 ml/m LA Vol (A4C):   63.7 ml 41.84 ml/m LA Biplane Vol: 53.4 ml 35.07 ml/m AORTIC VALVE                     PULMONIC VALVE AV Area (Vmax):    1.82 cm      PV Vmax:          1.39 m/s AV Area (Vmean):   1.84 cm      PV Peak grad:     7.7 mmHg AV Area (VTI):     2.10 cm      PR End Diast Vel: 1.75 msec AV Vmax:           194.00 cm/s AV Vmean:          121.000 cm/s AV VTI:            0.317 m AV Peak Grad:      15.1 mmHg AV Mean Grad:      7.0 mmHg LVOT Vmax:         139.00 cm/s LVOT Vmean:        87.600 cm/s LVOT VTI:          0.262 m LVOT/AV VTI ratio: 0.83  AORTA Ao Root diam: 2.70 cm Ao Asc diam:  3.20 cm Ao Arch diam: 2.5 cm  MITRAL VALVE               TRICUSPID VALVE MV Area (PHT): 2.75 cm    TR Peak grad:   20.8 mmHg MV Decel Time: 276 msec    TR Vmax:        228.00 cm/s MV E velocity: 58.00 cm/s MV A velocity: 88.60 cm/s  SHUNTS MV E/A ratio:  0.65        Systemic VTI:  0.26 m Systemic Diam: 1.80 cm  Hillis Lu MD Electronically signed by Hillis Lu MD Signature Date/Time: 01/18/2023/11:51:48 AM  Final      CT SCANS  CT CORONARY MORPH W/CTA COR W/SCORE 12/29/2022  Addendum 01/05/2023 11:12 AM ADDENDUM REPORT: 01/05/2023 11:09  EXAM: OVER-READ INTERPRETATION  CT CHEST  The following report is an over-read performed by radiologist Dr. Chadwick Colonel of [facility] on 01/05/2023. This over-read does not include interpretation of cardiac or coronary anatomy or pathology. The coronary CTA interpretation by the cardiologist is attached.  COMPARISON:  Chest CT from PET 12/15/2022  FINDINGS: Vascular: No aortic atherosclerosis. The included aorta is normal in caliber.  Mediastinum/nodes: No adenopathy or mass. Unremarkable esophagus.  Lungs: Stable area of consolidation in the left lower lobe from prior PET. Again seen 8 mm left lower lobe nodule series 11, image 35. No  pleural fluid. The included airways are patent.  Upper abdomen: No acute or unexpected findings.  Musculoskeletal: There are no acute or suspicious osseous abnormalities. Surgical clips in the left breast partially included in the field of view.  IMPRESSION: 1. Stable area of left lower lobe consolidation from recent PET, favored on that exam to represent post treatment related change. 2. Stable left lower lobe pulmonary nodule measuring 8 mm, not hypermetabolic on recent PET. Continued oncologic follow-up recommended.   Electronically Signed By: Chadwick Colonel M.D. On: 01/05/2023 11:09  Narrative CLINICAL DATA:  42F with with breast cancer s/p lumpectomy and XRT, hypertension, hyperlipidemia and chest pain.  EXAM: Cardiac/Coronary  CT  TECHNIQUE: The patient was scanned on a [employer].  FINDINGS: A 120 kV prospective scan was triggered in the descending thoracic aorta at 111 HU's. Axial non-contrast 3 mm slices were carried out through the heart. The data set was analyzed on a dedicated work station and scored using the Agatson method. Gantry rotation speed was 250 msecs and collimation was .6 mm. No beta blockade and 0.8 mg of sl NTG was given. The 3D data set was reconstructed in 5% intervals of the 67-82 % of the R-R cycle. Diastolic phases were analyzed on a dedicated work station using MPR, MIP and VRT modes. The patient received 80 cc of contrast.  Aorta: Normal size.  No calcifications.  No dissection.  Aortic Valve:  Trileaflet.  Mild calcification.  Coronary Arteries:  Normal coronary origin.  Left dominance.  RCA is a non-dominant artery.  There is no plaque.  Left main is a large artery that gives rise to LAD and LCX arteries.  LAD is a large vessel that has minimal (<25%) calcified plaque proximally.  LCX is a dominant artery with minimal (<25%) soft plaque distally. There is a small OM1 with minimal (<25%) calcified  plaque.  Coronary Calcium  Score:  Left main: 0  Left anterior descending artery: 242  Left circumflex artery: 0  Right coronary artery: 0  Total: 242  Percentile: 83rd  Other findings:  Normal pulmonary vein drainage into the left atrium.  Normal let atrial appendage without a thrombus.  Normal size of the pulmonary artery.  IMPRESSION: 1. Coronary calcium  score of 242. This was 83rd percentile for age-, sex, and race-matched controls.  2. Total plaque volume 171 mm3 which is 41st percentile for age- and sex-matched controls (calcified plaque 51 mm3; non-calcified plaque 120 mm3). TPV is (moderate).  2. Normal coronary origin with left dominance.  3.  There is minimal plaque in the LAD, LCX and OM1.  CAD-RADS 1.  RECOMMENDATIONS: 1. CAD-RADS 0: No evidence of CAD (0%). Consider non-atherosclerotic causes of chest pain.  2. CAD-RADS 1: Minimal non-obstructive  CAD (0-24%). Consider non-atherosclerotic causes of chest pain. Consider preventive therapy and risk factor modification.  3. CAD-RADS 2: Mild non-obstructive CAD (25-49%). Consider non-atherosclerotic causes of chest pain. Consider preventive therapy and risk factor modification.  4. CAD-RADS 3: Moderate stenosis. Consider symptom-guided anti-ischemic pharmacotherapy as well as risk factor modification per guideline directed care. Additional analysis with CT FFR will be submitted.  5. CAD-RADS 4: Severe stenosis. (70-99% or > 50% left main). Cardiac catheterization or CT FFR is recommended. Consider symptom-guided anti-ischemic pharmacotherapy as well as risk factor modification per guideline directed care. Invasive coronary angiography recommended with revascularization per published guideline statements.  6. CAD-RADS 5: Total coronary occlusion (100%). Consider cardiac catheterization or viability assessment. Consider symptom-guided anti-ischemic pharmacotherapy as well as risk factor  modification per guideline directed care.  7. CAD-RADS N: Non-diagnostic study. Obstructive CAD can't be excluded. Alternative evaluation is recommended.  Maudine Sos, MD  Electronically Signed: By: Maudine Sos M.D. On: 12/29/2022 16:11     ______________________________________________________________________________________________         Risk Assessment/Calculations:             Physical Exam:   VS:  BP 132/80   Pulse (!) 58   Ht 4' 10.5" (1.486 m)   Wt 192 lb (87.1 kg)   SpO2 96%   BMI 39.45 kg/m    Wt Readings from Last 3 Encounters:  12/19/23 192 lb (87.1 kg)  12/07/23 191 lb 3.2 oz (86.7 kg)  11/16/23 184 lb (83.5 kg)    GEN: Well nourished, well developed in no acute distress NECK: No JVD; No carotid bruits CARDIAC: RRR, no murmurs, rubs, gallops RESPIRATORY:  Clear to auscultation without rales, wheezing or rhonchi  ABDOMEN: Soft, non-tender, non-distended EXTREMITIES:  Nonpitting bilateral LE edema; No deformity,   ASSESSMENT AND PLAN: .    CAD/HLD, LDL goal less than 70 -coronary CTA 12/29/2022 with minimal nonobstructive coronary artery disease.  GDMT aspirin 81 mg daily, Repatha  140 mg every 14 days.  Unable to tolerate rosuvastatin  due to elevated LFTs..  Unfortunately her present cancer regimen impacts LDL.  11/07/2023 LDL 119.  Hypertension - BP well controlled. Continue current antihypertensive regimen Nifedipine  60mg  daily, Spironolactone  25mg  daily, Valsartan  80mg  daily. Discussed to monitor BP at home at least 2 hours after medications and sitting for 5-10 minutes.    Lower extremity edema -   Echo 12/2022 normal LVEF with no evidence of heart failure.  Likely related to lymphedema, venous insufficiency, present cancer medications.  Leg elevation, compression stockings, low-sodium diet encouraged. Continue lasix  per oncology team.   LVH - Repeat echo scheduled 01/2024 for monitoring. Continue optimal BP control.       Dispo: follow up in  6 months   Signed, Clearnce Curia, NP

## 2023-12-19 NOTE — Patient Instructions (Signed)
 Medication Instructions:  Your physician recommends that you continue on your current medications as directed. Please refer to the Current Medication list given to you today.   Follow-Up:  Please follow up in 6 months with Dr. Theodis Fiscal, Slater Duncan, NP or Neomi Banks, NP

## 2023-12-19 NOTE — Progress Notes (Signed)
 Specialty Pharmacy Refill Coordination Note  Laura Bush is a 73 y.o. female contacted today regarding refills of specialty medication(s) Lorlatinib  (LORBRENA )   Patient requested (Patient-Rptd) Delivery   Delivery date: (Patient-Rptd) 01/08/24   Verified address: (Patient-Rptd) [address] [place], [place] [address]   Medication will be filled on 01/05/24.

## 2023-12-20 ENCOUNTER — Ambulatory Visit
Admission: RE | Admit: 2023-12-20 | Discharge: 2023-12-20 | Disposition: A | Discharge status: Home / Self Care | Source: Ambulatory Visit | Attending: Radiation Oncology | Admitting: Radiation Oncology

## 2023-12-20 ENCOUNTER — Other Ambulatory Visit: Payer: Self-pay

## 2023-12-20 DIAGNOSIS — C3490 Malignant neoplasm of unspecified part of unspecified bronchus or lung: Secondary | ICD-10-CM

## 2023-12-20 DIAGNOSIS — C7951 Secondary malignant neoplasm of bone: Secondary | ICD-10-CM | POA: Diagnosis not present

## 2023-12-20 DIAGNOSIS — C3432 Malignant neoplasm of lower lobe, left bronchus or lung: Secondary | ICD-10-CM | POA: Diagnosis not present

## 2023-12-20 DIAGNOSIS — Z510 Encounter for antineoplastic radiation therapy: Secondary | ICD-10-CM | POA: Diagnosis not present

## 2023-12-20 DIAGNOSIS — C7952 Secondary malignant neoplasm of bone marrow: Secondary | ICD-10-CM | POA: Diagnosis not present

## 2023-12-20 LAB — RAD ONC ARIA SESSION SUMMARY
Course Elapsed Days: 0
Plan Fractions Treated to Date: 1
Plan Prescribed Dose Per Fraction: 3 Gy
Plan Total Fractions Prescribed: 10
Plan Total Prescribed Dose: 30 Gy
Reference Point Dosage Given to Date: 3 Gy
Reference Point Session Dosage Given: 3 Gy
Session Number: 1

## 2023-12-21 ENCOUNTER — Ambulatory Visit
Admission: RE | Admit: 2023-12-21 | Discharge: 2023-12-21 | Disposition: A | Discharge status: Home / Self Care | Source: Ambulatory Visit | Attending: Radiation Oncology | Admitting: Radiation Oncology

## 2023-12-21 ENCOUNTER — Other Ambulatory Visit: Payer: Self-pay

## 2023-12-21 DIAGNOSIS — C7951 Secondary malignant neoplasm of bone: Secondary | ICD-10-CM | POA: Diagnosis not present

## 2023-12-21 DIAGNOSIS — C7952 Secondary malignant neoplasm of bone marrow: Secondary | ICD-10-CM | POA: Diagnosis not present

## 2023-12-21 DIAGNOSIS — C3432 Malignant neoplasm of lower lobe, left bronchus or lung: Secondary | ICD-10-CM | POA: Diagnosis not present

## 2023-12-21 DIAGNOSIS — Z510 Encounter for antineoplastic radiation therapy: Secondary | ICD-10-CM | POA: Diagnosis not present

## 2023-12-21 LAB — RAD ONC ARIA SESSION SUMMARY
Course Elapsed Days: 1
Plan Fractions Treated to Date: 2
Plan Prescribed Dose Per Fraction: 3 Gy
Plan Total Fractions Prescribed: 10
Plan Total Prescribed Dose: 30 Gy
Reference Point Dosage Given to Date: 6 Gy
Reference Point Session Dosage Given: 3 Gy
Session Number: 2

## 2023-12-22 ENCOUNTER — Other Ambulatory Visit: Payer: Self-pay

## 2023-12-22 ENCOUNTER — Ambulatory Visit
Admission: RE | Admit: 2023-12-22 | Discharge: 2023-12-22 | Disposition: A | Discharge status: Home / Self Care | Source: Ambulatory Visit | Attending: Radiation Oncology | Admitting: Radiation Oncology

## 2023-12-22 DIAGNOSIS — Z510 Encounter for antineoplastic radiation therapy: Secondary | ICD-10-CM | POA: Diagnosis not present

## 2023-12-22 DIAGNOSIS — C7952 Secondary malignant neoplasm of bone marrow: Secondary | ICD-10-CM | POA: Diagnosis not present

## 2023-12-22 DIAGNOSIS — C7951 Secondary malignant neoplasm of bone: Secondary | ICD-10-CM | POA: Diagnosis not present

## 2023-12-22 DIAGNOSIS — C3432 Malignant neoplasm of lower lobe, left bronchus or lung: Secondary | ICD-10-CM | POA: Diagnosis not present

## 2023-12-22 LAB — RAD ONC ARIA SESSION SUMMARY
Course Elapsed Days: 2
Plan Fractions Treated to Date: 3
Plan Prescribed Dose Per Fraction: 3 Gy
Plan Total Fractions Prescribed: 10
Plan Total Prescribed Dose: 30 Gy
Reference Point Dosage Given to Date: 9 Gy
Reference Point Session Dosage Given: 3 Gy
Session Number: 3

## 2023-12-25 ENCOUNTER — Other Ambulatory Visit: Payer: Self-pay

## 2023-12-25 ENCOUNTER — Ambulatory Visit
Admission: RE | Admit: 2023-12-25 | Discharge: 2023-12-25 | Disposition: A | Discharge status: Home / Self Care | Source: Ambulatory Visit | Attending: Radiation Oncology | Admitting: Radiation Oncology

## 2023-12-25 DIAGNOSIS — G4733 Obstructive sleep apnea (adult) (pediatric): Secondary | ICD-10-CM | POA: Diagnosis not present

## 2023-12-25 DIAGNOSIS — C7952 Secondary malignant neoplasm of bone marrow: Secondary | ICD-10-CM | POA: Diagnosis not present

## 2023-12-25 DIAGNOSIS — C3432 Malignant neoplasm of lower lobe, left bronchus or lung: Secondary | ICD-10-CM | POA: Diagnosis not present

## 2023-12-25 DIAGNOSIS — Z510 Encounter for antineoplastic radiation therapy: Secondary | ICD-10-CM | POA: Diagnosis not present

## 2023-12-25 DIAGNOSIS — C7951 Secondary malignant neoplasm of bone: Secondary | ICD-10-CM | POA: Diagnosis not present

## 2023-12-25 LAB — RAD ONC ARIA SESSION SUMMARY
Course Elapsed Days: 5
Plan Fractions Treated to Date: 4
Plan Prescribed Dose Per Fraction: 3 Gy
Plan Total Fractions Prescribed: 10
Plan Total Prescribed Dose: 30 Gy
Reference Point Dosage Given to Date: 12 Gy
Reference Point Session Dosage Given: 3 Gy
Session Number: 4

## 2023-12-26 ENCOUNTER — Ambulatory Visit
Admission: RE | Admit: 2023-12-26 | Discharge: 2023-12-26 | Disposition: A | Discharge status: Home / Self Care | Source: Ambulatory Visit | Attending: Radiation Oncology | Admitting: Radiation Oncology

## 2023-12-26 ENCOUNTER — Inpatient Hospital Stay: Admitting: Hematology & Oncology

## 2023-12-26 ENCOUNTER — Inpatient Hospital Stay

## 2023-12-26 ENCOUNTER — Inpatient Hospital Stay: Attending: Hematology & Oncology

## 2023-12-26 ENCOUNTER — Other Ambulatory Visit: Payer: Self-pay

## 2023-12-26 DIAGNOSIS — C7952 Secondary malignant neoplasm of bone marrow: Secondary | ICD-10-CM | POA: Diagnosis not present

## 2023-12-26 DIAGNOSIS — C7951 Secondary malignant neoplasm of bone: Secondary | ICD-10-CM | POA: Insufficient documentation

## 2023-12-26 DIAGNOSIS — D509 Iron deficiency anemia, unspecified: Secondary | ICD-10-CM | POA: Insufficient documentation

## 2023-12-26 DIAGNOSIS — C3432 Malignant neoplasm of lower lobe, left bronchus or lung: Secondary | ICD-10-CM | POA: Diagnosis not present

## 2023-12-26 DIAGNOSIS — Z79899 Other long term (current) drug therapy: Secondary | ICD-10-CM | POA: Insufficient documentation

## 2023-12-26 DIAGNOSIS — C3490 Malignant neoplasm of unspecified part of unspecified bronchus or lung: Secondary | ICD-10-CM

## 2023-12-26 DIAGNOSIS — Z510 Encounter for antineoplastic radiation therapy: Secondary | ICD-10-CM | POA: Diagnosis not present

## 2023-12-26 LAB — CBC WITH DIFFERENTIAL (CANCER CENTER ONLY)
Abs Immature Granulocytes: 0.06 10*3/uL (ref 0.00–0.07)
Basophils Absolute: 0 10*3/uL (ref 0.0–0.1)
Basophils Relative: 1 %
Eosinophils Absolute: 0.4 10*3/uL (ref 0.0–0.5)
Eosinophils Relative: 8 %
HCT: 35.7 % — ABNORMAL LOW (ref 36.0–46.0)
Hemoglobin: 12.1 g/dL (ref 12.0–15.0)
Immature Granulocytes: 1 %
Lymphocytes Relative: 23 %
Lymphs Abs: 1.2 10*3/uL (ref 0.7–4.0)
MCH: 33.2 pg (ref 26.0–34.0)
MCHC: 33.9 g/dL (ref 30.0–36.0)
MCV: 97.8 fL (ref 80.0–100.0)
Monocytes Absolute: 0.8 10*3/uL (ref 0.1–1.0)
Monocytes Relative: 15 %
Neutro Abs: 2.7 10*3/uL (ref 1.7–7.7)
Neutrophils Relative %: 52 %
Platelet Count: 147 10*3/uL — ABNORMAL LOW (ref 150–400)
RBC: 3.65 MIL/uL — ABNORMAL LOW (ref 3.87–5.11)
RDW: 13.3 % (ref 11.5–15.5)
WBC Count: 5.1 10*3/uL (ref 4.0–10.5)
nRBC: 0 % (ref 0.0–0.2)

## 2023-12-26 LAB — RAD ONC ARIA SESSION SUMMARY
Course Elapsed Days: 6
Plan Fractions Treated to Date: 5
Plan Prescribed Dose Per Fraction: 3 Gy
Plan Total Fractions Prescribed: 10
Plan Total Prescribed Dose: 30 Gy
Reference Point Dosage Given to Date: 15 Gy
Reference Point Session Dosage Given: 3 Gy
Session Number: 5

## 2023-12-26 LAB — LACTATE DEHYDROGENASE: LDH: 159 U/L (ref 98–192)

## 2023-12-26 LAB — CMP (CANCER CENTER ONLY)
ALT: 19 U/L (ref 0–44)
AST: 17 U/L (ref 15–41)
Albumin: 4.4 g/dL (ref 3.5–5.0)
Alkaline Phosphatase: 51 U/L (ref 38–126)
Anion gap: 9 (ref 5–15)
BUN: 27 mg/dL — ABNORMAL HIGH (ref 8–23)
CO2: 31 mmol/L (ref 22–32)
Calcium: 10.6 mg/dL — ABNORMAL HIGH (ref 8.9–10.3)
Chloride: 101 mmol/L (ref 98–111)
Creatinine: 1.05 mg/dL — ABNORMAL HIGH (ref 0.44–1.00)
GFR, Estimated: 56 mL/min — ABNORMAL LOW (ref >60)
Glucose, Bld: 110 mg/dL — ABNORMAL HIGH (ref 70–99)
Potassium: 4.4 mmol/L (ref 3.5–5.1)
Sodium: 141 mmol/L (ref 135–145)
Total Bilirubin: 0.3 mg/dL (ref 0.0–1.2)
Total Protein: 7.2 g/dL (ref 6.5–8.1)

## 2023-12-27 ENCOUNTER — Other Ambulatory Visit: Payer: Self-pay

## 2023-12-27 ENCOUNTER — Ambulatory Visit
Admission: RE | Admit: 2023-12-27 | Discharge: 2023-12-27 | Disposition: A | Discharge status: Home / Self Care | Source: Ambulatory Visit | Attending: Radiation Oncology | Admitting: Radiation Oncology

## 2023-12-27 ENCOUNTER — Other Ambulatory Visit (HOSPITAL_COMMUNITY): Payer: Self-pay

## 2023-12-27 DIAGNOSIS — C3432 Malignant neoplasm of lower lobe, left bronchus or lung: Secondary | ICD-10-CM | POA: Diagnosis not present

## 2023-12-27 DIAGNOSIS — C7952 Secondary malignant neoplasm of bone marrow: Secondary | ICD-10-CM | POA: Diagnosis not present

## 2023-12-27 DIAGNOSIS — C7951 Secondary malignant neoplasm of bone: Secondary | ICD-10-CM | POA: Diagnosis not present

## 2023-12-27 DIAGNOSIS — Z510 Encounter for antineoplastic radiation therapy: Secondary | ICD-10-CM | POA: Diagnosis not present

## 2023-12-27 LAB — RAD ONC ARIA SESSION SUMMARY
Course Elapsed Days: 7
Plan Fractions Treated to Date: 6
Plan Prescribed Dose Per Fraction: 3 Gy
Plan Total Fractions Prescribed: 10
Plan Total Prescribed Dose: 30 Gy
Reference Point Dosage Given to Date: 18 Gy
Reference Point Session Dosage Given: 3 Gy
Session Number: 6

## 2023-12-28 ENCOUNTER — Other Ambulatory Visit: Payer: Self-pay

## 2023-12-28 ENCOUNTER — Ambulatory Visit
Admission: RE | Admit: 2023-12-28 | Discharge: 2023-12-28 | Disposition: A | Discharge status: Home / Self Care | Source: Ambulatory Visit | Attending: Radiation Oncology | Admitting: Radiation Oncology

## 2023-12-28 ENCOUNTER — Ambulatory Visit: Admitting: Hematology & Oncology

## 2023-12-28 DIAGNOSIS — C7952 Secondary malignant neoplasm of bone marrow: Secondary | ICD-10-CM | POA: Diagnosis not present

## 2023-12-28 DIAGNOSIS — C7951 Secondary malignant neoplasm of bone: Secondary | ICD-10-CM | POA: Diagnosis not present

## 2023-12-28 DIAGNOSIS — Z510 Encounter for antineoplastic radiation therapy: Secondary | ICD-10-CM | POA: Diagnosis not present

## 2023-12-28 DIAGNOSIS — C3432 Malignant neoplasm of lower lobe, left bronchus or lung: Secondary | ICD-10-CM | POA: Diagnosis not present

## 2023-12-28 LAB — RAD ONC ARIA SESSION SUMMARY
Course Elapsed Days: 8
Plan Fractions Treated to Date: 7
Plan Prescribed Dose Per Fraction: 3 Gy
Plan Total Fractions Prescribed: 10
Plan Total Prescribed Dose: 30 Gy
Reference Point Dosage Given to Date: 21 Gy
Reference Point Session Dosage Given: 3 Gy
Session Number: 7

## 2023-12-29 ENCOUNTER — Encounter: Payer: Self-pay | Admitting: Hematology & Oncology

## 2023-12-29 ENCOUNTER — Ambulatory Visit
Admission: RE | Admit: 2023-12-29 | Discharge: 2023-12-29 | Disposition: A | Discharge status: Home / Self Care | Source: Ambulatory Visit | Attending: Radiation Oncology | Admitting: Radiation Oncology

## 2023-12-29 ENCOUNTER — Other Ambulatory Visit: Payer: Self-pay

## 2023-12-29 ENCOUNTER — Inpatient Hospital Stay: Admitting: Hematology & Oncology

## 2023-12-29 VITALS — BP 134/58 | HR 69 | Temp 98.5°F | Resp 20 | Ht 58.5 in | Wt 195.0 lb

## 2023-12-29 DIAGNOSIS — D509 Iron deficiency anemia, unspecified: Secondary | ICD-10-CM | POA: Diagnosis not present

## 2023-12-29 DIAGNOSIS — C7952 Secondary malignant neoplasm of bone marrow: Secondary | ICD-10-CM | POA: Diagnosis not present

## 2023-12-29 DIAGNOSIS — C7951 Secondary malignant neoplasm of bone: Secondary | ICD-10-CM | POA: Diagnosis not present

## 2023-12-29 DIAGNOSIS — C3432 Malignant neoplasm of lower lobe, left bronchus or lung: Secondary | ICD-10-CM | POA: Diagnosis not present

## 2023-12-29 DIAGNOSIS — C3490 Malignant neoplasm of unspecified part of unspecified bronchus or lung: Secondary | ICD-10-CM

## 2023-12-29 DIAGNOSIS — Z510 Encounter for antineoplastic radiation therapy: Secondary | ICD-10-CM | POA: Diagnosis not present

## 2023-12-29 DIAGNOSIS — Z79899 Other long term (current) drug therapy: Secondary | ICD-10-CM | POA: Diagnosis not present

## 2023-12-29 LAB — RAD ONC ARIA SESSION SUMMARY
Course Elapsed Days: 9
Plan Fractions Treated to Date: 8
Plan Prescribed Dose Per Fraction: 3 Gy
Plan Total Fractions Prescribed: 10
Plan Total Prescribed Dose: 30 Gy
Reference Point Dosage Given to Date: 24 Gy
Reference Point Session Dosage Given: 3 Gy
Session Number: 8

## 2023-12-29 MED ORDER — METOLAZONE 5 MG PO TABS
5.0000 mg | ORAL_TABLET | Freq: Every day | ORAL | 4 refills | Status: DC
Start: 2023-12-29 — End: 2024-06-03

## 2023-12-29 NOTE — Progress Notes (Signed)
 Hematology and Oncology Follow Up Visit  Laura Bush [id number] 1950/12/19 73 y.o. 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 3 months  -next dose in 11/2023   -- on hold due to maxillary infection S/P SBRT to LUL -- completed on 03/04/2022 S/P XRT to RIGHT femur -- completed on 03/04/2022 Alecensa  600 mg po BID -- start on 03/24/2022 --on hold starting 04/04/2023 -restart 300 mg p.o. twice daily on 05/04/2023 --DC on 05/26/2023 Lorbrena  100 mg po q day  -- start on 06/14/2023 Aranesp  300 mcg subcu every 3 weeks Venofer -given on 07/10/2023     Interim History:  Laura Bush is back for follow-up.  She currently is undergoing radiotherapy to the right hip.  She has a treatments.  She has 2 left ago.  Hopefully, this will help with the issue with respect to the possibility of recurrence.  She has a big problem right now of gaining weight.  I do not know if this is from the Lorbrena .  She has gained 10 pounds.  This is all water weight.  She has no evidence of heart failure.  The last echocardiogram was about a year ago.  I will try her on some Zaroxolyn to take before Lasix .  Hopefully, this will help get rid of some of the water.  She is eating okay.  She is having no problems with nausea or vomiting.  She is having no problems with bowels or bladder.  I guess there might be a little bit of diarrhea.  She has had no rashes.  There is been no headache.  Overall, I would have to say that her performance status is probably ECOG 1.        Medications:  Current Outpatient Medications:    acetaminophen  (TYLENOL ) 500 MG tablet, Take 1 tablet (500 mg total) by mouth every 8 (eight) hours as needed for mild pain or moderate pain., Disp: 30 tablet, Rfl: 0    Albuterol -Budesonide  90-80 MCG/ACT AERO, Inhale 2 puffs into the lungs every 6 (six) hours as needed (wheezing, sob)., Disp: 10.7 g, Rfl: 1   aspirin EC 81 MG tablet, Take 81 mg by mouth in the morning. Swallow whole., Disp: , Rfl:    B Complex-C (SUPER B COMPLEX PO), Take 1 tablet by mouth daily with breakfast., Disp: , Rfl:    Calcium  Carb-Cholecalciferol (CALCIUM  + D3 PO), Take 1 tablet by mouth in the morning., Disp: , Rfl:    cholecalciferol (VITAMIN D3) 25 MCG (1000 UNIT) tablet, Take 1,000 Units by mouth in the morning., Disp: , Rfl:    COLLAGEN PO, Take 3 tablets by mouth in the morning and at bedtime. With Biotin, Disp: , Rfl:    Denosumab  (XGEVA  [place]), Inject into the skin every 3 (three) months., Disp: , Rfl:    Evolocumab  (REPATHA  SURECLICK) 140 MG/ML SOAJ, Inject 140 mg into the skin every 14 (fourteen) days., Disp: 6 mL, Rfl: 3   fexofenadine (ALLEGRA) 180 MG tablet, Take 180 mg by mouth in the morning., Disp: , Rfl:    folic acid  (FOLATE) 400 MCG tablet, Take 400 mcg by mouth in the morning., Disp: , Rfl:    furosemide  (LASIX ) 20 MG tablet, Take Lasix  two tablets (20 mg each) once a day, Disp: 60 tablet, Rfl: 4  ibuprofen (ADVIL) 200 MG tablet, Take 600 mg by mouth daily as needed., Disp: , Rfl:    ipratropium (ATROVENT ) 0.06 % nasal spray, Place 2 sprays into both nostrils 4 (four) times daily as needed for rhinitis., Disp: 15 mL, Rfl: 4   lorlatinib  (LORBRENA ) 100 MG tablet, Take 1 tablet (100 mg total) by mouth daily. Swallow tablets whole. Do not chew, crush or split tablets., Disp: 30 tablet, Rfl: 0   metFORMIN  (GLUCOPHAGE ) 500 MG tablet, Take 1 tablet by mouth daily, Disp: 90 tablet, Rfl: 3   NIFEdipine  (PROCARDIA  XL/NIFEDICAL XL) 60 MG 24 hr tablet, Take 1 tablet (60 mg total) by mouth daily., Disp: 90 tablet, Rfl: 1   pantoprazole  (PROTONIX ) 40 MG tablet, Take 1 tablet (40 mg total) by mouth daily., Disp: 90 tablet, Rfl: 0   spironolactone  (ALDACTONE ) 25 MG tablet, Take 1  tablet (25 mg total) by mouth daily., Disp: 90 tablet, Rfl: 3   traZODone  (DESYREL ) 50 MG tablet, TAKE 1 TABLET BY MOUTH EVERY NIGHT AT BEDTIME AS NEEDED FOR SLEEP (Patient taking differently: Take 50 mg by mouth at bedtime. for sleep), Disp: 90 tablet, Rfl: 2   valsartan  (DIOVAN ) 80 MG tablet, TAKE 1 TABLET BY MOUTH DAILY, Disp: 90 tablet, Rfl: 0  Allergies:  Allergies  Allergen Reactions   Diclofenac  Hypertension   Amlodipine  Swelling and Other (See Comments)    Limbs swell, not the throat   Lanolin Other (See Comments)    Sneezing and watery eyes. Allergic to Wool.   Tape Other (See Comments)    Redness (Bandaids also)    Past Medical History, Surgical history, Social history, and Family History were reviewed and updated.  Review of Systems: Review of Systems  Constitutional: Negative.   HENT:  Negative.    Eyes: Negative.   Respiratory: Negative.    Cardiovascular: Negative.   Gastrointestinal: Negative.   Endocrine: Negative.   Genitourinary: Negative.    Musculoskeletal:  Positive for arthralgias.  Skin: Negative.   Neurological: Negative.   Hematological: Negative.   Psychiatric/Behavioral: Negative.      Physical Exam: Vital signs show temperature of 98.5.  Pulse 69.  Blood pressure 134/58.  Weight is 195 pounds.    Wt Readings from Last 3 Encounters:  12/29/23 195 lb (88.5 kg)  12/19/23 192 lb (87.1 kg)  12/07/23 191 lb 3.2 oz (86.7 kg)    Physical Exam Vitals reviewed.  HENT:     Head: Normocephalic and atraumatic.  Eyes:     Pupils: Pupils are equal, round, and reactive to light.  Cardiovascular:     Rate and Rhythm: Normal rate and regular rhythm.     Heart sounds: Normal heart sounds.  Pulmonary:     Effort: Pulmonary effort is normal.     Breath sounds: Normal breath sounds.  Abdominal:     General: Bowel sounds are normal.     Palpations: Abdomen is soft.  Musculoskeletal:        General: No tenderness or deformity. Normal range of motion.      Cervical back: Normal range of motion.     Comments: She has about 2+ edema in the lower legs.  I cannot palpate any venous cord.  She has a negative Homans' sign.  Lymphadenopathy:     Cervical: No cervical adenopathy.  Skin:    General: Skin is warm and dry.     Findings: No erythema or rash.  Neurological:     Mental Status: She is alert and oriented  to person, place, and time.  Psychiatric:        Behavior: Behavior normal.        Thought Content: Thought content normal.        Judgment: Judgment normal.      Lab Results  Component Value Date   WBC 5.1 12/26/2023   HGB 12.1 12/26/2023   HCT 35.7 (L) 12/26/2023   MCV 97.8 12/26/2023   PLT 147 (L) 12/26/2023     Chemistry      Component Value Date/Time   NA 141 12/26/2023 0740   NA 142 07/17/2023 1358   NA 142 05/29/2017 1002   K 4.4 12/26/2023 0740   K 3.9 05/29/2017 1002   CL 101 12/26/2023 0740   CO2 31 12/26/2023 0740   CO2 28 05/29/2017 1002   BUN 27 (H) 12/26/2023 0740   BUN 26 07/17/2023 1358   BUN 13.5 05/29/2017 1002   CREATININE 1.05 (H) 12/26/2023 0740   CREATININE 0.81 04/06/2020 1059   CREATININE 0.8 05/29/2017 1002      Component Value Date/Time   CALCIUM  10.6 (H) 12/26/2023 0740   CALCIUM  9.4 05/29/2017 1002   ALKPHOS 51 12/26/2023 0740   ALKPHOS 39 (L) 05/29/2017 1002   AST 17 12/26/2023 0740   AST 18 05/29/2017 1002   ALT 19 12/26/2023 0740   ALT 19 05/29/2017 1002   BILITOT 0.3 12/26/2023 0740   BILITOT 0.48 05/29/2017 1002       Impression and Plan: Laura Bush is a very charming 73 year old white female.  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  This was actually a small lesion.  Currently, she is on radiotherapy to the right hip.  Hopefully, this will help and will help minimize any further complications.  At some point, we will need to do another PET scan on her.  We will try to get 1 set up for sometime in late June.  I hate that she is having this  water buildup.  Again this might be from the Lorbrena   Maybe, the Zaroxolyn  will help.  I would like to see her back probably in about 3 weeks so we can mostly try to help manage the weight gain.   Ivor Mars, MD 5/23/20258:18 AM

## 2024-01-02 ENCOUNTER — Ambulatory Visit
Admission: RE | Admit: 2024-01-02 | Discharge: 2024-01-02 | Disposition: A | Discharge status: Home / Self Care | Source: Ambulatory Visit | Attending: Radiation Oncology | Admitting: Radiation Oncology

## 2024-01-02 ENCOUNTER — Other Ambulatory Visit: Payer: Self-pay

## 2024-01-02 DIAGNOSIS — Z510 Encounter for antineoplastic radiation therapy: Secondary | ICD-10-CM | POA: Diagnosis not present

## 2024-01-02 DIAGNOSIS — C3432 Malignant neoplasm of lower lobe, left bronchus or lung: Secondary | ICD-10-CM | POA: Diagnosis not present

## 2024-01-02 DIAGNOSIS — C7951 Secondary malignant neoplasm of bone: Secondary | ICD-10-CM | POA: Diagnosis not present

## 2024-01-02 DIAGNOSIS — C7952 Secondary malignant neoplasm of bone marrow: Secondary | ICD-10-CM | POA: Diagnosis not present

## 2024-01-02 LAB — RAD ONC ARIA SESSION SUMMARY
Course Elapsed Days: 13
Plan Fractions Treated to Date: 9
Plan Prescribed Dose Per Fraction: 3 Gy
Plan Total Fractions Prescribed: 10
Plan Total Prescribed Dose: 30 Gy
Reference Point Dosage Given to Date: 27 Gy
Reference Point Session Dosage Given: 3 Gy
Session Number: 9

## 2024-01-03 ENCOUNTER — Ambulatory Visit
Admission: RE | Admit: 2024-01-03 | Discharge: 2024-01-03 | Disposition: A | Discharge status: Home / Self Care | Source: Ambulatory Visit | Attending: Radiation Oncology | Admitting: Radiation Oncology

## 2024-01-03 ENCOUNTER — Other Ambulatory Visit: Payer: Self-pay

## 2024-01-03 DIAGNOSIS — C7952 Secondary malignant neoplasm of bone marrow: Secondary | ICD-10-CM | POA: Diagnosis not present

## 2024-01-03 DIAGNOSIS — Z510 Encounter for antineoplastic radiation therapy: Secondary | ICD-10-CM | POA: Diagnosis not present

## 2024-01-03 DIAGNOSIS — C3432 Malignant neoplasm of lower lobe, left bronchus or lung: Secondary | ICD-10-CM | POA: Diagnosis not present

## 2024-01-03 DIAGNOSIS — C7951 Secondary malignant neoplasm of bone: Secondary | ICD-10-CM | POA: Diagnosis not present

## 2024-01-03 LAB — RAD ONC ARIA SESSION SUMMARY
Course Elapsed Days: 14
Plan Fractions Treated to Date: 10
Plan Prescribed Dose Per Fraction: 3 Gy
Plan Total Fractions Prescribed: 10
Plan Total Prescribed Dose: 30 Gy
Reference Point Dosage Given to Date: 30 Gy
Reference Point Session Dosage Given: 3 Gy
Session Number: 10

## 2024-01-04 NOTE — Radiation Completion Notes (Addendum)
  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 She reported improvement in her right pelvis pain during her treatment.   The patient will return in one month and will continue follow up with Dr. Maria Shiner as well.      Amiel Kalata, PA-C

## 2024-01-05 ENCOUNTER — Other Ambulatory Visit: Payer: Self-pay

## 2024-01-05 ENCOUNTER — Encounter: Payer: Self-pay | Admitting: Pharmacist

## 2024-01-05 NOTE — Progress Notes (Signed)
 Pharmacy Quality Measure Review  This patient is appearing on a report for being at risk of failing the adherence measure for cholesterol (statin) medications this calendar year.   Medication: pravastatin  Last fill date: 10/12/2023 for 40 day supply  Reviewed patient's records. It looks like pravastatin  was discontinued in December 2024 when Repatha  was started. However spoke with pharmacist at [facility] and pravastatin  was filled for 40 day supply on 08/17/2023 and again 10/12/2023  Spoke with her pharmacy [facility] and they report she did pick up pravastatin  on both those dates. Spoke with Mrs. [place] and she states she has nto been taking pravastatin  but the pharmacy did auto-refill them so she has pravastatin  at home. Unfortunately this will cause patient to fail MAC measure for 2025.  Patient to continue Repatha  140mg  every 14 days.  Called [facility] and cancelled any remaining pravastatin  refills.   Cecilie Coffee, PharmD Clinical Pharmacist [facility]  Population Health [phone]

## 2024-01-10 ENCOUNTER — Ambulatory Visit (HOSPITAL_BASED_OUTPATIENT_CLINIC_OR_DEPARTMENT_OTHER): Payer: Medicare HMO | Admitting: Cardiovascular Disease

## 2024-01-11 ENCOUNTER — Other Ambulatory Visit: Payer: Self-pay | Admitting: Hematology & Oncology

## 2024-01-15 ENCOUNTER — Ambulatory Visit (HOSPITAL_BASED_OUTPATIENT_CLINIC_OR_DEPARTMENT_OTHER): Payer: Medicare HMO

## 2024-01-15 DIAGNOSIS — I517 Cardiomegaly: Secondary | ICD-10-CM

## 2024-01-15 DIAGNOSIS — I10 Essential (primary) hypertension: Secondary | ICD-10-CM

## 2024-01-15 LAB — ECHOCARDIOGRAM COMPLETE
Area-P 1/2: 3.21 cm2
P 1/2 time: 935 ms
S' Lateral: 2.87 cm

## 2024-01-16 ENCOUNTER — Ambulatory Visit: Payer: Self-pay | Admitting: Cardiovascular Disease

## 2024-01-18 ENCOUNTER — Encounter: Payer: Self-pay | Admitting: Pharmacist Clinician (PhC)/ Clinical Pharmacy Specialist

## 2024-01-23 ENCOUNTER — Inpatient Hospital Stay: Attending: Hematology & Oncology

## 2024-01-23 ENCOUNTER — Other Ambulatory Visit: Payer: Self-pay

## 2024-01-23 ENCOUNTER — Inpatient Hospital Stay: Admitting: Hematology & Oncology

## 2024-01-23 ENCOUNTER — Encounter: Payer: Self-pay | Admitting: Hematology & Oncology

## 2024-01-23 VITALS — BP 113/56 | HR 62 | Temp 98.9°F | Resp 16 | Ht 58.5 in | Wt 191.0 lb

## 2024-01-23 DIAGNOSIS — C7951 Secondary malignant neoplasm of bone: Secondary | ICD-10-CM | POA: Diagnosis not present

## 2024-01-23 DIAGNOSIS — Z79899 Other long term (current) drug therapy: Secondary | ICD-10-CM | POA: Insufficient documentation

## 2024-01-23 DIAGNOSIS — C3432 Malignant neoplasm of lower lobe, left bronchus or lung: Secondary | ICD-10-CM | POA: Diagnosis not present

## 2024-01-23 DIAGNOSIS — C3490 Malignant neoplasm of unspecified part of unspecified bronchus or lung: Secondary | ICD-10-CM | POA: Diagnosis not present

## 2024-01-23 DIAGNOSIS — D509 Iron deficiency anemia, unspecified: Secondary | ICD-10-CM | POA: Insufficient documentation

## 2024-01-23 LAB — CBC WITH DIFFERENTIAL (CANCER CENTER ONLY)
Abs Immature Granulocytes: 0.09 10*3/uL — ABNORMAL HIGH (ref 0.00–0.07)
Basophils Absolute: 0 10*3/uL (ref 0.0–0.1)
Basophils Relative: 1 %
Eosinophils Absolute: 0.5 10*3/uL (ref 0.0–0.5)
Eosinophils Relative: 9 %
HCT: 34.3 % — ABNORMAL LOW (ref 36.0–46.0)
Hemoglobin: 11.7 g/dL — ABNORMAL LOW (ref 12.0–15.0)
Immature Granulocytes: 2 %
Lymphocytes Relative: 22 %
Lymphs Abs: 1.3 10*3/uL (ref 0.7–4.0)
MCH: 33.3 pg (ref 26.0–34.0)
MCHC: 34.1 g/dL (ref 30.0–36.0)
MCV: 97.7 fL (ref 80.0–100.0)
Monocytes Absolute: 0.8 10*3/uL (ref 0.1–1.0)
Monocytes Relative: 14 %
Neutro Abs: 3.1 10*3/uL (ref 1.7–7.7)
Neutrophils Relative %: 52 %
Platelet Count: 148 10*3/uL — ABNORMAL LOW (ref 150–400)
RBC: 3.51 MIL/uL — ABNORMAL LOW (ref 3.87–5.11)
RDW: 13.5 % (ref 11.5–15.5)
WBC Count: 5.8 10*3/uL (ref 4.0–10.5)
nRBC: 0 % (ref 0.0–0.2)

## 2024-01-23 LAB — CMP (CANCER CENTER ONLY)
ALT: 20 U/L (ref 0–44)
AST: 16 U/L (ref 15–41)
Albumin: 4.4 g/dL (ref 3.5–5.0)
Alkaline Phosphatase: 70 U/L (ref 38–126)
Anion gap: 12 (ref 5–15)
BUN: 38 mg/dL — ABNORMAL HIGH (ref 8–23)
CO2: 31 mmol/L (ref 22–32)
Calcium: 10.9 mg/dL — ABNORMAL HIGH (ref 8.9–10.3)
Chloride: 98 mmol/L (ref 98–111)
Creatinine: 1.07 mg/dL — ABNORMAL HIGH (ref 0.44–1.00)
GFR, Estimated: 55 mL/min — ABNORMAL LOW (ref >60)
Glucose, Bld: 108 mg/dL — ABNORMAL HIGH (ref 70–99)
Potassium: 3.4 mmol/L — ABNORMAL LOW (ref 3.5–5.1)
Sodium: 141 mmol/L (ref 135–145)
Total Bilirubin: 0.3 mg/dL (ref 0.0–1.2)
Total Protein: 7.5 g/dL (ref 6.5–8.1)

## 2024-01-23 LAB — MAGNESIUM: Magnesium: 1.9 mg/dL (ref 1.7–2.4)

## 2024-01-23 NOTE — Progress Notes (Signed)
 Hematology and Oncology Follow Up Visit  Laura Bush [id number] 1951/02/10 73 y.o. 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 3 months  -next dose in 11/2023   -- on hold due to maxillary infection S/P SBRT to LUL -- completed on 03/04/2022 S/P XRT to RIGHT femur -- completed on 03/04/2022 Alecensa  600 mg po BID -- start on 03/24/2022 --on hold starting 04/04/2023 -restart 300 mg p.o. twice daily on 05/04/2023 --DC on 05/26/2023 Lorbrena  100 mg po q day  -- start on 06/14/2023 Aranesp  300 mcg subcu every 3 weeks Venofer -given on 07/10/2023     Interim History:  Laura Bush is back for follow-up.  She completed radiotherapy.  She is still having pain in the right hip area.  Again, I do not know if this is malignant or this is just wear-and-tear and just arthritis and degeneration of the right hip.  I know she has had surgery on that right hip.  She has had a rod put in the right thigh.  She still has swelling.  This might be from the Lorbrena .  We do have her on Zaroxolyn  along with Lasix .  She did lose 10 pounds but then has gained some back.  She has had no cough.  She has had no change in bowel or bladder habits.  There has been no bleeding.  She has had no headache.  There is been no problems with her appetite.  She has had no nausea or vomiting.  As always, she and her husband will be traveling.  I do think there [place] be going out [place] in [place].  The loss of going up to the [place]  state.  I really hate the fact that she is having some problems with the hip.  I know she has seen Orthopedic Surgery out at [facility].  Her last PET scan was done back in February.  It certainly would not be a bad idea to think about another 1 in July.  Her last iron  studies  that were done back in April showed a ferritin of 645 with an iron  saturation of 36%.  Currently, I would have to say that her performance status is probably ECOG 1.  Medications:  Current Outpatient Medications:    acetaminophen  (TYLENOL ) 500 MG tablet, Take 1 tablet (500 mg total) by mouth every 8 (eight) hours as needed for mild pain or moderate pain., Disp: 30 tablet, Rfl: 0   Albuterol -Budesonide  90-80 MCG/ACT AERO, Inhale 2 puffs into the lungs every 6 (six) hours as needed (wheezing, sob)., Disp: 10.7 g, Rfl: 1   aspirin EC 81 MG tablet, Take 81 mg by mouth in the morning. Swallow whole., Disp: , Rfl:    B Complex-C (SUPER B COMPLEX PO), Take 1 tablet by mouth daily with breakfast., Disp: , Rfl:    Calcium  Carb-Cholecalciferol (CALCIUM  + D3 PO), Take 1 tablet by mouth in the morning., Disp: , Rfl:    cholecalciferol (VITAMIN D3) 25 MCG (1000 UNIT) tablet, Take 1,000 Units by mouth in the morning., Disp: , Rfl:    COLLAGEN PO, Take 3 tablets by mouth in the morning and at bedtime. With Biotin, Disp: , Rfl:    Denosumab  (XGEVA  [place]), Inject into the skin every 3 (three) months., Disp: , Rfl:  Evolocumab  (REPATHA  SURECLICK) 140 MG/ML SOAJ, Inject 140 mg into the skin every 14 (fourteen) days., Disp: 6 mL, Rfl: 3   fexofenadine (ALLEGRA) 180 MG tablet, Take 180 mg by mouth in the morning., Disp: , Rfl:    folic acid  (FOLATE) 400 MCG tablet, Take 400 mcg by mouth in the morning., Disp: , Rfl:    furosemide  (LASIX ) 20 MG tablet, Take Lasix  two tablets (20 mg each) once a day, Disp: 60 tablet, Rfl: 4   ibuprofen (ADVIL) 200 MG tablet, Take 600 mg by mouth daily as needed., Disp: , Rfl:    ipratropium (ATROVENT ) 0.06 % nasal spray, Place 2 sprays into both nostrils 4 (four) times daily as needed for rhinitis., Disp: 15 mL, Rfl: 4   lorlatinib  (LORBRENA ) 100 MG tablet, Take 1 tablet (100 mg total) by mouth daily. Swallow tablets whole. Do not chew, crush or split tablets., Disp: 30 tablet, Rfl: 0    metFORMIN  (GLUCOPHAGE ) 500 MG tablet, Take 1 tablet by mouth daily, Disp: 90 tablet, Rfl: 3   metolazone  (ZAROXOLYN ) 5 MG tablet, Take 1 tablet (5 mg total) by mouth daily. Take Zaroxolyn  about 30-45 minutes prior to Lasix  each day., Disp: 30 tablet, Rfl: 4   NIFEdipine  (PROCARDIA  XL/NIFEDICAL XL) 60 MG 24 hr tablet, Take 1 tablet (60 mg total) by mouth daily., Disp: 90 tablet, Rfl: 1   pantoprazole  (PROTONIX ) 40 MG tablet, Take 1 tablet (40 mg total) by mouth daily., Disp: 90 tablet, Rfl: 0   spironolactone  (ALDACTONE ) 25 MG tablet, Take 1 tablet (25 mg total) by mouth daily., Disp: 90 tablet, Rfl: 3   traZODone  (DESYREL ) 50 MG tablet, TAKE 1 TABLET BY MOUTH EVERY NIGHT AT BEDTIME AS NEEDED FOR SLEEP, Disp: 90 tablet, Rfl: 2   valsartan  (DIOVAN ) 80 MG tablet, TAKE 1 TABLET BY MOUTH DAILY, Disp: 90 tablet, Rfl: 0  Allergies:  Allergies  Allergen Reactions   Diclofenac  Hypertension   Amlodipine  Swelling and Other (See Comments)    Limbs swell, not the throat   Lanolin Other (See Comments)    Sneezing and watery eyes. Allergic to Wool.   Tape Other (See Comments)    Redness (Bandaids also)    Past Medical History, Surgical history, Social history, and Family History were reviewed and updated.  Review of Systems: Review of Systems  Constitutional: Negative.   HENT:  Negative.    Eyes: Negative.   Respiratory: Negative.    Cardiovascular: Negative.   Gastrointestinal: Negative.   Endocrine: Negative.   Genitourinary: Negative.    Musculoskeletal:  Positive for arthralgias.  Skin: Negative.   Neurological: Negative.   Hematological: Negative.   Psychiatric/Behavioral: Negative.      Physical Exam: Vital signs show temperature of 98.9.  Pulse 69.  Blood pressure 113/56.  Weight is 191 pounds.     Wt Readings from Last 3 Encounters:  12/29/23 195 lb (88.5 kg)  12/19/23 192 lb (87.1 kg)  12/07/23 191 lb 3.2 oz (86.7 kg)    Physical Exam Vitals reviewed.  HENT:     Head:  Normocephalic and atraumatic.   Eyes:     Pupils: Pupils are equal, round, and reactive to light.    Cardiovascular:     Rate and Rhythm: Normal rate and regular rhythm.     Heart sounds: Normal heart sounds.  Pulmonary:     Effort: Pulmonary effort is normal.     Breath sounds: Normal breath sounds.  Abdominal:     General: Bowel sounds are normal.  Palpations: Abdomen is soft.   Musculoskeletal:        General: No tenderness or deformity. Normal range of motion.     Cervical back: Normal range of motion.     Comments: She has about 2+ edema in the lower legs.  I cannot palpate any venous cord.  She has a negative Homans' sign.  Lymphadenopathy:     Cervical: No cervical adenopathy.   Skin:    General: Skin is warm and dry.     Findings: No erythema or rash.   Neurological:     Mental Status: She is alert and oriented to person, place, and time.   Psychiatric:        Behavior: Behavior normal.        Thought Content: Thought content normal.        Judgment: Judgment normal.     Lab Results  Component Value Date   WBC 5.8 01/23/2024   HGB 11.7 (L) 01/23/2024   HCT 34.3 (L) 01/23/2024   MCV 97.7 01/23/2024   PLT 148 (L) 01/23/2024     Chemistry      Component Value Date/Time   NA 141 12/26/2023 0740   NA 142 07/17/2023 1358   NA 142 05/29/2017 1002   K 4.4 12/26/2023 0740   K 3.9 05/29/2017 1002   CL 101 12/26/2023 0740   CO2 31 12/26/2023 0740   CO2 28 05/29/2017 1002   BUN 27 (H) 12/26/2023 0740   BUN 26 07/17/2023 1358   BUN 13.5 05/29/2017 1002   CREATININE 1.05 (H) 12/26/2023 0740   CREATININE 0.81 04/06/2020 1059   CREATININE 0.8 05/29/2017 1002      Component Value Date/Time   CALCIUM  10.6 (H) 12/26/2023 0740   CALCIUM  9.4 05/29/2017 1002   ALKPHOS 51 12/26/2023 0740   ALKPHOS 39 (L) 05/29/2017 1002   AST 17 12/26/2023 0740   AST 18 05/29/2017 1002   ALT 19 12/26/2023 0740   ALT 19 05/29/2017 1002   BILITOT 0.3 12/26/2023 0740    BILITOT 0.48 05/29/2017 1002       Impression and Plan: Laura Bush is a very charming 73 year old white female.  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  This was actually a small lesion.  She completed radiotherapy to the right hip about 3 weeks ago.  Again, she is having pain.  I am not sure if this will ever improve.  I am not sure if she would ever be able to have any kind of surgery in the right hip since she is already had surgery in that area.  She is post to see the Orthopedic Surgeon at [facility] in late July.  I was wonder if there might be any way we could try to move that appointment up.  Again we will set her up with a PET scan.  Will try to get a PET scan in about 3 weeks or so.  I just hate that she has had other problems with the swelling.  This might be from the Lorbrena .  I thought about maybe decreasing the dose or maybe switching over to a different agent.  I might consider her for brigatinib .  I know that she and her husband will have a very busy summer.  Will be their 50th wedding anniversary.  I am very happy for them.  I just want her to be able to feel like she can do what she would like to do.  It has  been a while since she has had Xgeva .  We will see about doing this when we see her back.Aaron Aas   Ivor Mars, MD 6/17/20258:36 AM

## 2024-01-28 NOTE — Progress Notes (Unsigned)
 [facility] [address], [address] [place], [place] [address] [phone]  Date:  02/01/2024   Name:  Laura Bush   DOB:  Dec 10, 1950   MRN:  [id number]  PCP:  Watt Harlene BROCKS, MD    Chief Complaint: No chief complaint on file.   History of Present Illness:  Laura Bush is a 73 y.o. 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 3 months  -next dose in 11/2023   -- on hold due to maxillary infection S/P SBRT to LUL -- completed on 03/04/2022 S/P XRT to RIGHT femur -- completed on 03/04/2022 Alecensa  600 mg po BID -- start on 03/24/2022 --on hold starting 04/04/2023 -restart 300 mg p.o. 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 Denver 04/21/2020   Osteopenia 10/02/2018   Malignant 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, estrogen receptor positive (HCC) 05/29/2017   Ductal carcinoma in situ (DCIS) of right breast 05/29/2017   Vitamin D  deficiency 01/29/2016   Eczema 12/29/2014   Venous insufficiency 10/16/2014   Primary hypertension 10/01/2014   Hypercholesteremia 10/01/2014   GERD (gastroesophageal reflux disease) 10/01/2014   Obesity 10/23/2013   Obstructive sleep apnea syndrome 07/19/2013   Nodule of finger 12/24/2009   Other bilateral bundle branch block 06/23/2003    Past Medical History:  Diagnosis Date   Allergy    seasonal   Anemia associated with stage 3 chronic renal failure (HCC) 04/18/2023   Arthritis Last several years   Mostly in hands, wrists, shoulders, knees   Asthma    in cold weather   Breast cancer (HCC) 09/18/2014   left breast   Breast cancer (HCC) 10/07/2014  bx left breast   Breast cancer of upper-outer quadrant of left female breast (HCC) 09/22/2014   Cluster headaches    in her 40's   GERD (gastroesophageal reflux disease)    Goals of care, counseling/discussion 01/14/2022   Heart murmur    as a child (has outgrown)   High cholesterol    History of radiation therapy    Left lung, Right Femur- 02/16/22-03/04/22- Dr. 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   Stroke [facility]) summer 2022   described as tiny   Swallowing difficulty    Venous insufficiency     Past Surgical History:  Procedure Laterality Date   ABDOMINAL HYSTERECTOMY  1999   APPENDECTOMY  ~ 2006   BONE BIOPSY Right 01/04/2022   Procedure: RIGHT FEMORAL BONE BIOPSY;  Surgeon: Celena Sharper, MD;  Location: [facility] OR;  Service: Orthopedics;  Laterality: Right;   BONE EXCISION Right 04/11/2023   Procedure: BONE EXCISION;  Surgeon: Celena Sharper, MD;  Location: [facility] OR;  Service: Orthopedics;  Laterality: Right;   BREAST BIOPSY Left 10/2014   BREAST LUMPECTOMY Left 2016   BREAST REDUCTION SURGERY Bilateral 11/11/2014   Procedure: Bilateral Breast Reduction;  Surgeon: Alm Sick, MD;  Location: [facility] OR;  Service: Plastics;  Laterality: Bilateral;   BRONCHIAL BIOPSY  01/06/2022   Procedure: BRONCHIAL BIOPSIES;  Surgeon: Shelah Lamar RAMAN, MD;  Location: [facility] ENDOSCOPY;  Service: Pulmonary;;   BRONCHIAL BRUSHINGS  01/06/2022   Procedure: BRONCHIAL BRUSHINGS;  Surgeon: Shelah Lamar RAMAN, MD;  Location: [facility] ENDOSCOPY;  Service: Pulmonary;;   BRONCHIAL NEEDLE ASPIRATION BIOPSY  01/06/2022   Procedure: BRONCHIAL NEEDLE ASPIRATION BIOPSIES;  Surgeon: Shelah Lamar RAMAN, MD;  Location: [facility] ENDOSCOPY;  Service: Pulmonary;;   BRONCHIAL WASHINGS  01/06/2022   Procedure: BRONCHIAL WASHINGS;  Surgeon: Shelah Lamar RAMAN, MD;  Location: [facility] ENDOSCOPY;  Service: Pulmonary;;   CARPAL TUNNEL RELEASE Bilateral    2 surgeries on right, 1 on left   CESAREAN SECTION  1978; 1980   COLONOSCOPY     FEMUR IM NAIL Right  01/04/2022   Procedure: RIGHT FEMORAL INTRAMEDULLARY (IM) NAIL;  Surgeon: Celena Sharper, MD;  Location: [facility] OR;  Service: Orthopedics;  Laterality: Right;   FIDUCIAL MARKER PLACEMENT  01/06/2022   Procedure: FIDUCIAL MARKER PLACEMENT;  Surgeon: Shelah Lamar RAMAN, MD;  Location: [facility] ENDOSCOPY;  Service: Pulmonary;;   FOREARM FRACTURE SURGERY Right 2003 X 3   FRACTURE SURGERY  2003, 2023   3 surgeries, right arm -03; Femur-23   HARDWARE REMOVAL Right 04/11/2023   Procedure: HARDWARE REMOVAL;  Surgeon: Celena Sharper, MD;  Location: [facility] OR;  Service: Orthopedics;  Laterality: Right;   KNEE ARTHROSCOPY Bilateral    meniscus repair   PARTIAL MASTECTOMY WITH NEEDLE LOCALIZATION AND AXILLARY SENTINEL LYMPH NODE BX Left 11/11/2014   Procedure: LEFT BREAST PARTIAL MASTECTOMY WITH NEEDLE LOCALIZATION TIMES TWO AND LEFT AXILLARY SENTINEL LYMPH NODE Biopsy;  Surgeon: Elon Pacini,  MD;  Location: [facility] OR;  Service: General;  Laterality: Left;   REDUCTION MAMMAPLASTY Bilateral 2016   SQUAMOUS CELL CARCINOMA EXCISION  1980's X 1   nose   TONSILLECTOMY  ~ 1957   TUBAL LIGATION  ?1984    Social History   Tobacco Use   Smoking status: Never   Smokeless tobacco: Never  Vaping Use   Vaping status: Never Used  Substance Use Topics   Alcohol use: Yes    Alcohol/week: 2.0 standard drinks of alcohol    Types: 2 Glasses of wine per week    Comment: 2 glasses a week   Drug use: No    Family History  Problem Relation Age of Onset   Dementia Mother        Lives in [place]   Hypertension Mother    Hyperlipidemia Mother    Stroke Father        deceased   Heart failure Father 23   Hypertension Father    Uterine cancer Sister 67   Heart attack Maternal Grandmother    Hypertension Maternal Grandfather    Stroke Paternal Grandfather    Colon cancer Neg Hx    Esophageal cancer Neg Hx    Pancreatic cancer Neg Hx    Stomach cancer Neg Hx    Liver disease Neg Hx    Sleep apnea Neg Hx     Allergies   Allergen Reactions   Diclofenac  Hypertension   Amlodipine  Swelling and Other (See Comments)    Limbs swell, not the throat   Lanolin Other (See Comments)    Sneezing and watery eyes. Allergic to Wool.   Tape Other (See Comments)    Redness (Bandaids also)    Medication list has been reviewed and updated.  Current Outpatient Medications on File Prior to Visit  Medication Sig Dispense Refill   acetaminophen  (TYLENOL ) 500 MG tablet Take 1 tablet (500 mg total) by mouth every 8 (eight) hours as needed for mild pain or moderate pain. 30 tablet 0   Albuterol -Budesonide  90-80 MCG/ACT AERO Inhale 2 puffs into the lungs every 6 (six) hours as needed (wheezing, sob). 10.7 g 1   aspirin EC 81 MG tablet Take 81 mg by mouth in the morning. Swallow whole.     B Complex-C (SUPER B COMPLEX PO) Take 1 tablet by mouth daily with breakfast.     Calcium  Carb-Cholecalciferol (CALCIUM  + D3 PO) Take 1 tablet by mouth in the morning.     cholecalciferol (VITAMIN D3) 25 MCG (1000 UNIT) tablet Take 1,000 Units by mouth in the morning.     COLLAGEN PO Take 3 tablets by mouth in the morning and at bedtime. With Biotin     Denosumab  (XGEVA  [place]) Inject into the skin every 3 (three) months.     Evolocumab  (REPATHA  SURECLICK) 140 MG/ML SOAJ Inject 140 mg into the skin every 14 (fourteen) days. 6 mL 3   fexofenadine (ALLEGRA) 180 MG tablet Take 180 mg by mouth in the morning.     folic acid  (FOLATE) 400 MCG tablet Take 400 mcg by mouth in the morning.     furosemide  (LASIX ) 20 MG tablet Take Lasix  two tablets (20 mg each) once a day 60 tablet 4   ibuprofen (ADVIL) 200 MG tablet Take 600 mg by mouth daily as needed.     ipratropium (ATROVENT ) 0.06 % nasal spray Place 2 sprays into both nostrils 4 (four) times daily as needed for rhinitis. 15 mL 4   lorlatinib  (LORBRENA )  100 MG tablet Take 1 tablet (100 mg total) by mouth daily. Swallow tablets whole. Do not chew, crush or split tablets. 30 tablet 0   metFORMIN   (GLUCOPHAGE ) 500 MG tablet Take 1 tablet by mouth daily 90 tablet 3   metolazone  (ZAROXOLYN ) 5 MG tablet Take 1 tablet (5 mg total) by mouth daily. Take Zaroxolyn  about 30-45 minutes prior to Lasix  each day. 30 tablet 4   NIFEdipine  (PROCARDIA  XL/NIFEDICAL XL) 60 MG 24 hr tablet Take 1 tablet (60 mg total) by mouth daily. 90 tablet 1   pantoprazole  (PROTONIX ) 40 MG tablet Take 1 tablet (40 mg total) by mouth daily. 90 tablet 0   spironolactone  (ALDACTONE ) 25 MG tablet Take 1 tablet (25 mg total) by mouth daily. 90 tablet 3   traZODone  (DESYREL ) 50 MG tablet TAKE 1 TABLET BY MOUTH EVERY NIGHT AT BEDTIME AS NEEDED FOR SLEEP 90 tablet 2   valsartan  (DIOVAN ) 80 MG tablet TAKE 1 TABLET BY MOUTH DAILY 90 tablet 0   No current facility-administered medications on file prior to visit.    Review of Systems:  As per HPI- otherwise negative.   Physical Examination: There were no vitals filed for this visit. There were no vitals filed for this visit. There is no height or weight on file to calculate BMI. Ideal Body Weight:    GEN: no acute distress. HEENT: Atraumatic, Normocephalic.  Ears and Nose: No external deformity. CV: RRR, No M/G/R. No JVD. No thrill. No extra heart sounds. PULM: CTA B, no wheezes, crackles, rhonchi. No retractions. No resp. distress. No accessory muscle use. ABD: S, NT, ND, +BS. No rebound. No HSM. EXTR: No c/c/e PSYCH: Normally interactive. Conversant.    Assessment and Plan: ***  Signed Harlene Schroeder, MD

## 2024-01-28 NOTE — Patient Instructions (Incomplete)
 Good to see you again - I am sorry you have had a hard time Let's try and get you back on a GLP-1 drug for weight Start on tirzepatide 2.5 mg weekly (sample) and then hopefully increase to 5 mg in a month- let me now how you tolerate the 2.5 and what happens with insurance coverage.  We can try [employer] direct if need be

## 2024-01-30 ENCOUNTER — Other Ambulatory Visit: Payer: Self-pay

## 2024-01-30 ENCOUNTER — Other Ambulatory Visit (HOSPITAL_BASED_OUTPATIENT_CLINIC_OR_DEPARTMENT_OTHER): Payer: Self-pay | Admitting: Cardiovascular Disease

## 2024-01-30 ENCOUNTER — Other Ambulatory Visit: Payer: Self-pay | Admitting: Hematology & Oncology

## 2024-01-30 ENCOUNTER — Encounter (INDEPENDENT_AMBULATORY_CARE_PROVIDER_SITE_OTHER): Payer: Self-pay

## 2024-01-30 DIAGNOSIS — G4733 Obstructive sleep apnea (adult) (pediatric): Secondary | ICD-10-CM | POA: Diagnosis not present

## 2024-01-30 DIAGNOSIS — C7951 Secondary malignant neoplasm of bone: Secondary | ICD-10-CM

## 2024-01-30 MED ORDER — LORLATINIB 100 MG PO TABS
100.0000 mg | ORAL_TABLET | Freq: Every day | ORAL | 0 refills | Status: DC
  Filled 2024-01-30: qty 30, 30d supply, fill #0

## 2024-01-30 NOTE — Progress Notes (Signed)
 Specialty Pharmacy Refill Coordination Note  Laura Bush is a 73 y.o. female contacted today regarding refills of specialty medication(s) Lorlatinib  (LORBRENA )   Patient requested Delivery   Delivery date: 02/01/24   Verified address: (Patient-Rptd) [address] [facility], [address]   Medication will be filled on 06.25.25.   Refill request pending, will need to notify patient if delayed.

## 2024-01-31 ENCOUNTER — Ambulatory Visit: Admitting: Family Medicine

## 2024-01-31 ENCOUNTER — Other Ambulatory Visit: Payer: Self-pay

## 2024-01-31 DIAGNOSIS — C7951 Secondary malignant neoplasm of bone: Secondary | ICD-10-CM | POA: Diagnosis not present

## 2024-01-31 DIAGNOSIS — M80051D Age-related osteoporosis with current pathological fracture, right femur, subsequent encounter for fracture with routine healing: Secondary | ICD-10-CM | POA: Diagnosis not present

## 2024-02-01 ENCOUNTER — Encounter: Payer: Self-pay | Admitting: Hematology & Oncology

## 2024-02-01 ENCOUNTER — Encounter: Payer: Self-pay | Admitting: Radiation Oncology

## 2024-02-01 ENCOUNTER — Encounter: Payer: Self-pay | Admitting: Family Medicine

## 2024-02-01 ENCOUNTER — Ambulatory Visit: Admitting: Family Medicine

## 2024-02-01 ENCOUNTER — Other Ambulatory Visit: Payer: Self-pay

## 2024-02-01 VITALS — BP 134/72 | HR 81 | Ht 58.5 in | Wt 191.2 lb

## 2024-02-01 DIAGNOSIS — R7303 Prediabetes: Secondary | ICD-10-CM

## 2024-02-01 DIAGNOSIS — G4733 Obstructive sleep apnea (adult) (pediatric): Secondary | ICD-10-CM

## 2024-02-01 LAB — HEMOGLOBIN A1C: Hgb A1c MFr Bld: 6.4 % (ref 4.6–6.5)

## 2024-02-01 MED ORDER — TIRZEPATIDE-WEIGHT MANAGEMENT 5 MG/0.5ML ~~LOC~~ SOLN: 5.0000 mg | SUBCUTANEOUS | 2 refills | Status: DC

## 2024-02-01 MED ORDER — TIRZEPATIDE 2.5 MG/0.5ML ~~LOC~~ SOAJ
2.5000 mg | SUBCUTANEOUS | Status: DC
Start: 2024-02-01 — End: 2024-02-27

## 2024-02-03 ENCOUNTER — Other Ambulatory Visit: Payer: Self-pay | Admitting: Family Medicine

## 2024-02-03 DIAGNOSIS — J4521 Mild intermittent asthma with (acute) exacerbation: Secondary | ICD-10-CM

## 2024-02-04 NOTE — Progress Notes (Incomplete)
 Radiation Oncology         [phone]) [phone] ________________________________  Name: Laura Bush MRN: [id number]  Date: 02/05/2024  DOB: [birth year]  Follow-Up Visit Note  CC: Copland, Harlene BROCKS, MD  Copland, Harlene BROCKS, MD  No diagnosis found.  Diagnosis: The primary encounter diagnosis was Secondary malignant neoplasm of bone and bone marrow (HCC). 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   Interval Since Last Radiation: 1 month and 2 days   3) Treatment Intent: Curative  Radiation Treatment Dates: First Treatment Date: 2023-12-20 -- Last Treatment Date: 2024-01-03 Site/Dose/Technique/Mode:  Plan Name: Pelvis_R Site: Femur Right Technique: Isodose Plan Mode: Photon Dose Per Fraction: 3 Gy Prescribed Dose (Delivered / Prescribed): 30 Gy / 30 Gy Prescribed Fxs (Delivered / Prescribed): 10 / 10  2) Radiation Treatment Dates: 02/16/2022 through 03/04/2022 Site Technique Total Dose (Gy) Dose per Fx (Gy) Completed Fx Beam Energies  Lung, Left: Lung_L IMRT 54/54 18 3/3 6XFFF  Femur Right: Ext_R 3D 24/24 3 8/8 10X  Femur Right: Ext_R_Bst IMRT 15/15 3 5/5 6X   1)  Diagnosis Stage IA TIb, N0, Left breast, IDC/DCIS; ER/PR+; Her2- diagnosed in 2016: s/p lumpectomy, XRT, antiestrogens  Dr. Dewey Diagnosis:   Left-sided breast cancer    Indication for treatment:  Curative      Radiation treatment dates:   12/30/2014 through 01/27/2015 Site/dose:   The patient initially received a dose of 42.5 Gy in 17 fractions to the breast using whole-breast tangent fields. This was delivered using a 3-D conformal technique. The patient then received a boost to the lumpectomy cavity. This delivered an additional 7.5 Gy in 3 fractions using a 3 field photon technique due to the depth of the lumpectomy cavity. The  total dose was 50 Gy.    Narrative:  The patient returns today for routine follow-up. She tolerated radiation therapy relatively well overall other than mild-moderate fatigue. She also reported improvement in her right pelvic pain during her treatment course.       Since completing radiation therapy, she was seen by Dr. Timmy on 01/23/24 for a routine follow-up visit. During that time, the patient endorsed ongoing pain to the right hip area since completing radiation (despite achieving improvement in her pain during treatment). Dr. Timmy notes that she is scheduled to seen her orthopedic surgeon in late July to discuss if anything else can be done for her pain.   In most recent history, she was seen by Dr. Federico at the [facility] on 01/31/24 and had a pelvic x-ray performed at that time which showed subtle worsening of endosteal scalloping and periostitis at the distal femoral diaphysis concerning for worsening disease. The healing fracture deformity of the right mid femoral diaphysis s/p ORIF otherwise appeared stable.   She was also seen by Dr. Timmy while undergoing radiotherapy on 12/29/23. At that time, she was observed to have gained approximally 10 pounds of water weight which Dr. Timmy suspected as possibly due to Lasix . The addition of Zaroxolyn  was subsequently recommended for her to take prior to receiving Lasix . Dr. Timmy has also ordered a restaging PET scan which is currently scheduled for late July.        ***  Allergies:  is allergic to diclofenac , amlodipine , lanolin, and tape.  Meds: Current Outpatient Medications  Medication Sig Dispense Refill   acetaminophen  (TYLENOL ) 500 MG tablet Take 1 tablet (500 mg total) by mouth every 8 (eight) hours as needed for mild pain or moderate pain. 30 tablet 0   Albuterol -Budesonide  90-80 MCG/ACT AERO Inhale 2 puffs into the lungs every 6 (six) hours as needed (wheezing, sob). 10.7 g 1   aspirin EC 81  MG tablet Take 81 mg by mouth in the morning. Swallow whole.     B Complex-C (SUPER B COMPLEX PO) Take 1 tablet by mouth daily with breakfast.     Calcium  Carb-Cholecalciferol (CALCIUM  + D3 PO) Take 1 tablet by mouth in the morning.     cholecalciferol (VITAMIN D3) 25 MCG (1000 UNIT) tablet Take 1,000 Units by mouth in the morning.     COLLAGEN PO Take 3 tablets by mouth in the morning and at bedtime. With Biotin     Denosumab  (XGEVA  [place]) Inject into the skin every 3 (three) months.     Evolocumab  (REPATHA  SURECLICK) 140 MG/ML SOAJ Inject 140 mg into the skin every 14 (fourteen) days. 6 mL 3   fexofenadine (ALLEGRA) 180 MG tablet Take 180 mg by mouth in the morning.     folic acid  (FOLATE) 400 MCG tablet Take 400 mcg by mouth in the morning.     furosemide  (LASIX ) 20 MG tablet Take Lasix  two tablets (20 mg each) once a day 60 tablet 4   ibuprofen (ADVIL) 200 MG tablet Take 600 mg by mouth daily as needed.     ipratropium (ATROVENT ) 0.06 % nasal spray Place 2 sprays into both nostrils 4 (four) times daily as needed for rhinitis. 15 mL 4   lorlatinib  (LORBRENA ) 100 MG tablet Take 1 tablet (100 mg total) by mouth daily. Swallow tablets whole. Do not chew, crush or split tablets. 30 tablet 0   metFORMIN  (GLUCOPHAGE ) 500 MG tablet Take 1 tablet by mouth daily 90 tablet 3   metolazone  (ZAROXOLYN ) 5 MG tablet Take 1 tablet (5 mg total) by mouth daily. Take Zaroxolyn  about 30-45 minutes prior to Lasix  each day. 30 tablet 4   NIFEdipine  (PROCARDIA  XL/NIFEDICAL XL) 60 MG 24 hr tablet Take 1 tablet (60 mg total) by mouth daily. 90 tablet 1   pantoprazole  (PROTONIX ) 40 MG tablet Take 1 tablet (40 mg total) by mouth daily. 90 tablet 0   spironolactone  (ALDACTONE ) 25 MG tablet Take 1 tablet (25 mg total) by mouth daily. 90 tablet 3   tirzepatide  (MOUNJARO ) 2.5 MG/0.5ML Pen Inject 2.5 mg into the skin once a week.     tirzepatide  5 MG/0.5ML injection vial Inject 5 mg into the skin once a week. 2 mL 2   traZODone   (DESYREL ) 50 MG tablet TAKE 1 TABLET BY MOUTH EVERY NIGHT AT BEDTIME AS NEEDED FOR SLEEP 90 tablet 2   valsartan  (DIOVAN ) 80 MG tablet TAKE 1 TABLET BY MOUTH DAILY 90 tablet 1   No current facility-administered medications for this encounter.    Physical Findings: The patient is in no acute distress. Patient is alert and oriented.  vitals were not taken for this visit. .  No significant changes. Lungs are clear to auscultation bilaterally. Heart has regular rate and rhythm. No palpable cervical, supraclavicular, or axillary adenopathy. Abdomen soft, non-tender, normal bowel sounds.   Lab Findings: Lab Results  Component Value Date   WBC 5.8 01/23/2024   HGB 11.7 (L) 01/23/2024  HCT 34.3 (L) 01/23/2024   MCV 97.7 01/23/2024   PLT 148 (L) 01/23/2024    Radiographic Findings: ECHOCARDIOGRAM COMPLETE Result Date: 01/15/2024    ECHOCARDIOGRAM REPORT   Patient Name:   Laura Bush Date of Exam: 01/15/2024 Medical Rec #:  [id number]            Height:       58.5 in Accession #:    [id number]           Weight:       195.0 lb Date of Birth:  Oct 05, 1950            BSA:          1.813 m Patient Age:    72 years             BP:           120/60 mmHg Patient Gender: F                    HR:           62 bpm. Exam Location:  Outpatient Procedure: 2D Echo, Color Doppler, Cardiac Doppler and Strain Analysis (Both            Spectral and Color Flow Doppler were utilized during procedure). Indications:     LVH  History:         Patient has prior history of Echocardiogram examinations, most                  recent 01/18/2023. CAD; Risk Factors:Hypertension, Non-Smoker                  and Dyslipidemia.  Sonographer:     Orvil Holmes RDCS Referring Phys:  [id number] [facility] [place] Diagnosing Phys: Lonni Nanas MD IMPRESSIONS  1. Left ventricular ejection fraction, by estimation, is 60 to 65%. The left ventricle has normal function. The left ventricle has no regional wall motion abnormalities. There  is mild left ventricular hypertrophy. Left ventricular diastolic parameters are consistent with Grade I diastolic dysfunction (impaired relaxation).  2. Right ventricular systolic function is normal. The right ventricular size is normal. Tricuspid regurgitation signal is inadequate for assessing PA pressure.  3. The mitral valve is normal in structure. Trivial mitral valve regurgitation. No evidence of mitral stenosis.  4. The aortic valve is tricuspid. Aortic valve regurgitation is trivial. Aortic valve sclerosis/calcification is present, without any evidence of aortic stenosis. FINDINGS  Left Ventricle: Left ventricular ejection fraction, by estimation, is 60 to 65%. The left ventricle has normal function. The left ventricle has no regional wall motion abnormalities. Global longitudinal strain performed but not reported based on interpreter judgement due to suboptimal tracking. The left ventricular internal cavity size was normal in size. There is mild left ventricular hypertrophy. Left ventricular diastolic parameters are consistent with Grade I diastolic dysfunction (impaired relaxation). Right Ventricle: The right ventricular size is normal. No increase in right ventricular wall thickness. Right ventricular systolic function is normal. Tricuspid regurgitation signal is inadequate for assessing PA pressure. Left Atrium: Left atrial size was normal in size. Right Atrium: Right atrial size was normal in size. Pericardium: There is no evidence of pericardial effusion. Mitral Valve: The mitral valve is normal in structure. Trivial mitral valve regurgitation. No evidence of mitral valve stenosis. Tricuspid Valve: The tricuspid valve is normal in structure. Tricuspid valve regurgitation is trivial. Aortic Valve: The aortic valve is tricuspid. Aortic valve regurgitation is trivial. Aortic regurgitation PHT measures 935  msec. Aortic valve sclerosis/calcification is present, without any evidence of aortic stenosis.  Pulmonic Valve: The pulmonic valve was not well visualized. Pulmonic valve regurgitation is trivial. Aorta: The aortic root and ascending aorta are structurally normal, with no evidence of dilitation. IAS/Shunts: The interatrial septum was not well visualized.  LEFT VENTRICLE PLAX 2D LVIDd:         4.59 cm   Diastology LVIDs:         2.87 cm   LV e' medial:    4.90 cm/s LV PW:         1.05 cm   LV E/e' medial:  12.0 LV IVS:        0.80 cm   LV e' lateral:   12.40 cm/s LVOT diam:     1.80 cm   LV E/e' lateral: 4.7 LV SV:         46 LV SV Index:   25        2D Longitudinal Strain LVOT Area:     2.54 cm  2D Strain GLS (A4C):   -15.1 %                          2D Strain GLS (A3C):   -11.8 %                          2D Strain GLS (A2C):   -12.2 %                          2D Strain GLS Avg:     -13.0 % RIGHT VENTRICLE RV Basal diam:  3.95 cm RV Mid diam:    3.00 cm RV S prime:     11.00 cm/s TAPSE (M-mode): 1.9 cm LEFT ATRIUM           Index        RIGHT ATRIUM           Index LA diam:      4.30 cm 2.37 cm/m   RA Area:     17.10 cm LA Vol (A2C): 18.4 ml 10.15 ml/m  RA Volume:   50.50 ml  27.85 ml/m LA Vol (A4C): 52.9 ml 29.18 ml/m  AORTIC VALVE LVOT Vmax:   85.40 cm/s LVOT Vmean:  55.500 cm/s LVOT VTI:    0.181 m AI PHT:      935 msec  AORTA Ao Root diam: 2.50 cm Ao Asc diam:  3.30 cm MITRAL VALVE MV Area (PHT): 3.21 cm     SHUNTS MV Decel Time: 236 msec     Systemic VTI:  0.18 m MV E velocity: 58.70 cm/s   Systemic Diam: 1.80 cm MV A velocity: 107.00 cm/s MV E/A ratio:  0.55 Lonni Nanas MD Electronically signed by Lonni Nanas MD Signature Date/Time: 01/15/2024/3:44:59 PM    Final (Updated)     Impression: 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  ***  Plan:  ***   *** minutes of total time was spent for this patient encounter, including  preparation, face-to-face counseling with the patient and coordination of care, physical exam, and documentation of the encounter. ____________________________________  Lynwood CHARM Nasuti, PhD, MD  This document serves as a record of services personally performed by Lynwood Nasuti, MD. It was  created on his behalf by Dorthy Fuse, a trained medical scribe. The creation of this record is based on the scribe's personal observations and the provider's statements to them. This document has been checked and approved by the attending provider.

## 2024-02-05 ENCOUNTER — Ambulatory Visit
Admission: RE | Admit: 2024-02-05 | Discharge: 2024-02-05 | Disposition: A | Discharge status: Home / Self Care | Source: Ambulatory Visit | Attending: Radiation Oncology | Admitting: Radiation Oncology

## 2024-02-05 ENCOUNTER — Encounter: Payer: Self-pay | Admitting: Radiation Oncology

## 2024-02-05 VITALS — BP 144/80 | HR 70 | Temp 98.3°F | Resp 14 | Wt 191.4 lb

## 2024-02-05 DIAGNOSIS — C7951 Secondary malignant neoplasm of bone: Secondary | ICD-10-CM | POA: Insufficient documentation

## 2024-02-05 NOTE — Progress Notes (Addendum)
 Akaisha Truman [place] is here today for follow up post radiation to the pelvis.  They completed their radiation on: 01/03/24   Does the patient complain of any of the following:  Pain: She reports 3/10 pain to the top of her leg. Abdominal bloating: Denies Diarrhea/Constipation: Denies Nausea/Vomiting: Denies Urinary Issues (dysuria/incomplete emptying/ incontinence/ increased frequency/urgency): Denies  Post radiation skin changes: Denies   Additional comments if applicable: She wanted Dr. Shannon to be aware of an xray was taken on 01/31/24 and she stated that it another stress fracture below treatment field.   BP (!) 144/80 (BP Location: Right Arm, Patient Position: Sitting) Comment: recheck; Nurse notified  Pulse 70   Temp 98.3 F (36.8 C) (Oral)   Resp 14   Wt 191 lb 6.4 oz (86.8 kg)   SpO2 100%   BMI 39.32 kg/m

## 2024-02-06 ENCOUNTER — Other Ambulatory Visit: Payer: Self-pay

## 2024-02-06 ENCOUNTER — Other Ambulatory Visit: Payer: Self-pay | Admitting: Radiation Oncology

## 2024-02-06 ENCOUNTER — Ambulatory Visit
Admission: RE | Admit: 2024-02-06 | Discharge: 2024-02-06 | Disposition: A | Discharge status: Home / Self Care | Source: Ambulatory Visit | Attending: Radiation Oncology | Admitting: Radiation Oncology

## 2024-02-06 DIAGNOSIS — Z510 Encounter for antineoplastic radiation therapy: Secondary | ICD-10-CM | POA: Insufficient documentation

## 2024-02-06 DIAGNOSIS — C3432 Malignant neoplasm of lower lobe, left bronchus or lung: Secondary | ICD-10-CM | POA: Insufficient documentation

## 2024-02-06 DIAGNOSIS — C7952 Secondary malignant neoplasm of bone marrow: Secondary | ICD-10-CM | POA: Insufficient documentation

## 2024-02-06 DIAGNOSIS — C7951 Secondary malignant neoplasm of bone: Secondary | ICD-10-CM

## 2024-02-07 ENCOUNTER — Ambulatory Visit: Admitting: Radiation Oncology

## 2024-02-07 ENCOUNTER — Encounter: Payer: Self-pay | Admitting: Hematology & Oncology

## 2024-02-08 ENCOUNTER — Ambulatory Visit

## 2024-02-08 ENCOUNTER — Other Ambulatory Visit: Payer: Self-pay

## 2024-02-08 NOTE — Progress Notes (Signed)
 Specialty Pharmacy Ongoing Clinical Assessment Note  Laura Bush is a 73 y.o. female who is being followed by the specialty pharmacy service for RxSp Oncology   Patient's specialty medication(s) reviewed today: Lorlatinib  (LORBRENA )   Missed doses in the last 4 weeks: 0   Patient/Caregiver did not have any additional questions or concerns.   Therapeutic benefit summary: Patient is achieving benefit   Adverse events/side effects summary: Experienced adverse events/side effects (edema/leg swelling being treated with diuretics)   Patient's therapy is appropriate to: Continue    Goals Addressed             This Visit's Progress    Slow Disease Progression       Patient is on track. Patient will maintain adherence. Next PET scan scheduled for 7/17.            Follow up: 3 months  Rivaldo Hineman M Kavion Mancinas Specialty Pharmacist

## 2024-02-12 ENCOUNTER — Ambulatory Visit

## 2024-02-13 ENCOUNTER — Ambulatory Visit

## 2024-02-14 ENCOUNTER — Ambulatory Visit

## 2024-02-15 ENCOUNTER — Ambulatory Visit

## 2024-02-15 ENCOUNTER — Encounter: Payer: Self-pay | Admitting: *Deleted

## 2024-02-16 ENCOUNTER — Ambulatory Visit

## 2024-02-16 ENCOUNTER — Other Ambulatory Visit (HOSPITAL_BASED_OUTPATIENT_CLINIC_OR_DEPARTMENT_OTHER): Payer: Self-pay | Admitting: Family

## 2024-02-16 DIAGNOSIS — I10 Essential (primary) hypertension: Secondary | ICD-10-CM

## 2024-02-19 ENCOUNTER — Ambulatory Visit

## 2024-02-20 ENCOUNTER — Ambulatory Visit

## 2024-02-21 ENCOUNTER — Ambulatory Visit

## 2024-02-22 ENCOUNTER — Encounter (HOSPITAL_COMMUNITY)
Admission: RE | Admit: 2024-02-22 | Discharge: 2024-02-22 | Disposition: A | Discharge status: Home / Self Care | Source: Ambulatory Visit | Attending: Hematology & Oncology | Admitting: Hematology & Oncology

## 2024-02-22 DIAGNOSIS — C7951 Secondary malignant neoplasm of bone: Secondary | ICD-10-CM | POA: Insufficient documentation

## 2024-02-22 DIAGNOSIS — C7952 Secondary malignant neoplasm of bone marrow: Secondary | ICD-10-CM | POA: Diagnosis not present

## 2024-02-22 DIAGNOSIS — C50912 Malignant neoplasm of unspecified site of left female breast: Secondary | ICD-10-CM | POA: Diagnosis not present

## 2024-02-22 DIAGNOSIS — C3490 Malignant neoplasm of unspecified part of unspecified bronchus or lung: Secondary | ICD-10-CM | POA: Diagnosis not present

## 2024-02-22 LAB — GLUCOSE, CAPILLARY: Glucose-Capillary: 95 mg/dL (ref 70–99)

## 2024-02-22 MED ORDER — FLUDEOXYGLUCOSE F - 18 (FDG) INJECTION
9.5000 | Freq: Once | INTRAVENOUS | Status: AC
  Administered 2024-02-22: 9.11 via INTRAVENOUS

## 2024-02-27 ENCOUNTER — Inpatient Hospital Stay

## 2024-02-27 ENCOUNTER — Inpatient Hospital Stay: Attending: Hematology & Oncology

## 2024-02-27 ENCOUNTER — Inpatient Hospital Stay (HOSPITAL_BASED_OUTPATIENT_CLINIC_OR_DEPARTMENT_OTHER): Admitting: Hematology & Oncology

## 2024-02-27 ENCOUNTER — Encounter: Payer: Self-pay | Admitting: Hematology & Oncology

## 2024-02-27 ENCOUNTER — Other Ambulatory Visit: Payer: Self-pay

## 2024-02-27 ENCOUNTER — Ambulatory Visit: Payer: Self-pay | Admitting: Hematology & Oncology

## 2024-02-27 VITALS — BP 118/56 | HR 69 | Temp 97.9°F | Resp 16 | Ht 58.5 in | Wt 186.0 lb

## 2024-02-27 DIAGNOSIS — C3490 Malignant neoplasm of unspecified part of unspecified bronchus or lung: Secondary | ICD-10-CM

## 2024-02-27 DIAGNOSIS — C7951 Secondary malignant neoplasm of bone: Secondary | ICD-10-CM | POA: Diagnosis not present

## 2024-02-27 DIAGNOSIS — C3492 Malignant neoplasm of unspecified part of left bronchus or lung: Secondary | ICD-10-CM | POA: Insufficient documentation

## 2024-02-27 DIAGNOSIS — D631 Anemia in chronic kidney disease: Secondary | ICD-10-CM | POA: Diagnosis not present

## 2024-02-27 DIAGNOSIS — N183 Chronic kidney disease, stage 3 unspecified: Secondary | ICD-10-CM

## 2024-02-27 LAB — CMP (CANCER CENTER ONLY)
ALT: 28 U/L (ref 0–44)
AST: 23 U/L (ref 15–41)
Albumin: 4.4 g/dL (ref 3.5–5.0)
Alkaline Phosphatase: 75 U/L (ref 38–126)
Anion gap: 15 (ref 5–15)
BUN: 27 mg/dL — ABNORMAL HIGH (ref 8–23)
CO2: 28 mmol/L (ref 22–32)
Calcium: 11.3 mg/dL — ABNORMAL HIGH (ref 8.9–10.3)
Chloride: 95 mmol/L — ABNORMAL LOW (ref 98–111)
Creatinine: 1.1 mg/dL — ABNORMAL HIGH (ref 0.44–1.00)
GFR, Estimated: 53 mL/min — ABNORMAL LOW (ref >60)
Glucose, Bld: 119 mg/dL — ABNORMAL HIGH (ref 70–99)
Potassium: 3.1 mmol/L — ABNORMAL LOW (ref 3.5–5.1)
Sodium: 138 mmol/L (ref 135–145)
Total Bilirubin: 0.3 mg/dL (ref 0.0–1.2)
Total Protein: 7.7 g/dL (ref 6.5–8.1)

## 2024-02-27 LAB — CBC WITH DIFFERENTIAL (CANCER CENTER ONLY)
Abs Immature Granulocytes: 0.07 K/uL (ref 0.00–0.07)
Basophils Absolute: 0 K/uL (ref 0.0–0.1)
Basophils Relative: 0 %
Eosinophils Absolute: 0.5 K/uL (ref 0.0–0.5)
Eosinophils Relative: 9 %
HCT: 34.2 % — ABNORMAL LOW (ref 36.0–46.0)
Hemoglobin: 11.7 g/dL — ABNORMAL LOW (ref 12.0–15.0)
Immature Granulocytes: 1 %
Lymphocytes Relative: 17 %
Lymphs Abs: 0.9 K/uL (ref 0.7–4.0)
MCH: 33.9 pg (ref 26.0–34.0)
MCHC: 34.2 g/dL (ref 30.0–36.0)
MCV: 99.1 fL (ref 80.0–100.0)
Monocytes Absolute: 0.5 K/uL (ref 0.1–1.0)
Monocytes Relative: 10 %
Neutro Abs: 3.3 K/uL (ref 1.7–7.7)
Neutrophils Relative %: 63 %
Platelet Count: 183 K/uL (ref 150–400)
RBC: 3.45 MIL/uL — ABNORMAL LOW (ref 3.87–5.11)
RDW: 13.5 % (ref 11.5–15.5)
WBC Count: 5.3 K/uL (ref 4.0–10.5)
nRBC: 0 % (ref 0.0–0.2)

## 2024-02-27 LAB — RETICULOCYTES
Immature Retic Fract: 11.9 % (ref 2.3–15.9)
RBC.: 3.41 MIL/uL — ABNORMAL LOW (ref 3.87–5.11)
Retic Count, Absolute: 56.9 K/uL (ref 19.0–186.0)
Retic Ct Pct: 1.7 % (ref 0.4–3.1)

## 2024-02-27 LAB — IRON AND IRON BINDING CAPACITY (CC-WL,HP ONLY)
Iron: 85 ug/dL (ref 28–170)
Saturation Ratios: 29 % (ref 10.4–31.8)
TIBC: 290 ug/dL (ref 250–450)
UIBC: 205 ug/dL

## 2024-02-27 LAB — MAGNESIUM: Magnesium: 1.8 mg/dL (ref 1.7–2.4)

## 2024-02-27 LAB — FERRITIN: Ferritin: 545 ng/mL — ABNORMAL HIGH (ref 11–307)

## 2024-02-27 MED ORDER — DENOSUMAB 120 MG/1.7ML ~~LOC~~ SOLN
120.0000 mg | Freq: Once | SUBCUTANEOUS | Status: AC
  Administered 2024-02-27: 120 mg via SUBCUTANEOUS
  Filled 2024-02-27: qty 1.7

## 2024-02-27 MED ORDER — RIVAROXABAN 10 MG PO TABS: 10.0000 mg | ORAL_TABLET | Freq: Every day | ORAL | 0 refills | Status: DC

## 2024-02-27 NOTE — Progress Notes (Unsigned)
 Hematology and Oncology Follow Up Visit  Laura Bush [id number] 01-[name]-1951 73 y.o. 02/27/2024   Principle Diagnosis:  Stage IV adenocarcinoma of the left lower lung-oligometastatic disease to the right femur -  ALK (+) Anemia renal insufficiency Iron  deficiency anemia  Current Therapy:   Status post right femur repair --01/04/2022 Xgeva  120 mg subcu q. 3 months  -next dose in 05/2024    S/P SBRT to LUL -- completed on 03/04/2022 S/P XRT to RIGHT femur -- completed on 03/04/2022 Alecensa  600 mg po BID -- start on 03/24/2022 --on hold starting 04/04/2023 -restart 300 mg p.o. twice daily on 05/04/2023 --DC on 05/26/2023 Lorbrena  100 mg po q day  -- start on 06/14/2023 Aranesp  300 mcg subcu every 3 weeks Venofer -given on 07/10/2023     Interim History:  [name] is back for follow-up.  She and her husband are getting ready to go on their big trip out west.  They were going out to [place] .  That she might be going into [place] .  I know that they will have a wonderful time out there.  After that, the be back home for a week or so and then drive up to [place] .  She is doing okay.  She is on Lorbrena .  She is now is on Mounjaro .  She is losing weight.  Her legs do not have swollen.  Very happy about this.  She did have a PET scan that was done.  This was done on 02/22/2024.  The PET scan showed that there is some activity in the right femoral neck around the intramedullary rod.  There was no disease that was active elsewhere.  The radiologist cannot tell if this was reactive or malignant.  I had Dr. Shannon of  Radiation Oncology look at this PET scan.  He thought this might be residual activity from where she had had radiotherapy.  He felt that she could just be watched.  She is still having some discomfort in that area.  It might be a little bit better.  She has had no cough.  She has had no change in bowel or bladder habits.  Her iron  studies that we did today showed a  ferritin of 545 with an iron  saturation of 29%.  She has had no headache.  She has had no rashes.  Overall, I would have to say that her performance status is probably ECOG 1.  Medications:  Current Outpatient Medications:    KLOR-CON  M20 20 MEQ tablet, Take 20 mEq by mouth 2 (two) times daily., Disp: , Rfl:    MOUNJARO  5 MG/0.5ML Pen, Inject 5 mg into the skin once a week., Disp: , Rfl:    acetaminophen  (TYLENOL ) 500 MG tablet, Take 1 tablet (500 mg total) by mouth every 8 (eight) hours as needed for mild pain or moderate pain., Disp: 30 tablet, Rfl: 0   AIRSUPRA  90-80 MCG/ACT AERO, INHALE 2 PUFFS BY MOUTH EVERY 6 HOURS AS NEEDED, Disp: 10.7 g, Rfl: 1   aspirin EC 81 MG tablet, Take 81 mg by mouth in the morning. Swallow whole., Disp: , Rfl:    B Complex-C (SUPER B COMPLEX PO), Take 1 tablet by mouth daily with breakfast., Disp: , Rfl:    Calcium  Carb-Cholecalciferol (CALCIUM  + D3 PO), Take 1 tablet by mouth in the morning., Disp: , Rfl:    cholecalciferol (VITAMIN D3) 25 MCG (1000 UNIT) tablet, Take 1,000 Units by mouth in the morning., Disp: , Rfl:    COLLAGEN  PO, Take 3 tablets by mouth in the morning and at bedtime. With Biotin, Disp: , Rfl:    Denosumab  (XGEVA  [place]), Inject into the skin every 3 (three) months., Disp: , Rfl:    Evolocumab  (REPATHA  SURECLICK) 140 MG/ML SOAJ, Inject 140 mg into the skin every 14 (fourteen) days., Disp: 6 mL, Rfl: 3   fexofenadine (ALLEGRA) 180 MG tablet, Take 180 mg by mouth in the morning., Disp: , Rfl:    folic acid  (FOLATE) 400 MCG tablet, Take 400 mcg by mouth in the morning., Disp: , Rfl:    furosemide  (LASIX ) 20 MG tablet, Take Lasix  two tablets (20 mg each) once a day, Disp: 60 tablet, Rfl: 4   ibuprofen (ADVIL) 200 MG tablet, Take 600 mg by mouth daily as needed., Disp: , Rfl:    ipratropium (ATROVENT ) 0.06 % nasal spray, Place 2 sprays into both nostrils 4 (four) times daily as needed for rhinitis., Disp: 15 mL, Rfl: 4   lorlatinib  (LORBRENA ) 100  MG tablet, Take 1 tablet (100 mg total) by mouth daily. Swallow tablets whole. Do not chew, crush or split tablets., Disp: 30 tablet, Rfl: 0   metFORMIN  (GLUCOPHAGE ) 500 MG tablet, Take 1 tablet by mouth daily, Disp: 90 tablet, Rfl: 3   metolazone  (ZAROXOLYN ) 5 MG tablet, Take 1 tablet (5 mg total) by mouth daily. Take Zaroxolyn  about 30-45 minutes prior to Lasix  each day., Disp: 30 tablet, Rfl: 4   NIFEdipine  (PROCARDIA  XL/NIFEDICAL XL) 60 MG 24 hr tablet, Take 1 tablet (60 mg total) by mouth daily., Disp: 90 tablet, Rfl: 1   pantoprazole  (PROTONIX ) 40 MG tablet, Take 1 tablet (40 mg total) by mouth daily., Disp: 90 tablet, Rfl: 0   spironolactone  (ALDACTONE ) 25 MG tablet, Take 1 tablet (25 mg total) by mouth daily., Disp: 90 tablet, Rfl: 3   tirzepatide  5 MG/0.5ML injection vial, Inject 5 mg into the skin once a week., Disp: 2 mL, Rfl: 2   traZODone  (DESYREL ) 50 MG tablet, TAKE 1 TABLET BY MOUTH EVERY NIGHT AT BEDTIME AS NEEDED FOR SLEEP, Disp: 90 tablet, Rfl: 2   valsartan  (DIOVAN ) 80 MG tablet, TAKE 1 TABLET BY MOUTH DAILY, Disp: 90 tablet, Rfl: 1 No current facility-administered medications for this visit.  Facility-Administered Medications Ordered in Other Visits:    denosumab  (XGEVA ) injection 120 mg, 120 mg, Subcutaneous, Once, Izabel Chim, Maude SAUNDERS, MD  Allergies:  Allergies  Allergen Reactions   Diclofenac  Hypertension and Other (See Comments)    Other Reaction(s): Hypertension   Amlodipine  Swelling and Other (See Comments)    Limbs swell, not the throat   Lanolin Other (See Comments)    Sneezing and watery eyes. Allergic to Wool.  Other Reaction(s): Other (See Comments)  Sneezing and watery eyes. Allergic to Wool.    Sneezing and watery eyes. Allergic to Wool.   Silicone Dermatitis and Other (See Comments)    Redness (Bandaids also)   Tape Other (See Comments)    Redness (Bandaids also)    Past Medical History, Surgical history, Social history, and Family History were reviewed  and updated.  Review of Systems: Review of Systems  Constitutional: Negative.   HENT:  Negative.    Eyes: Negative.   Respiratory: Negative.    Cardiovascular: Negative.   Gastrointestinal: Negative.   Endocrine: Negative.   Genitourinary: Negative.    Musculoskeletal:  Positive for arthralgias.  Skin: Negative.   Neurological: Negative.   Hematological: Negative.   Psychiatric/Behavioral: Negative.      Physical Exam: Vital  signs show temperature of 97.9.  Pulse 69.  Blood pressure 118/56.  Weight is 186 pounds.   Wt Readings from Last 3 Encounters:  02/27/24 186 lb (84.4 kg)  02/22/24 186 lb (84.4 kg)  02/05/24 191 lb 6.4 oz (86.8 kg)    Physical Exam Vitals reviewed.  HENT:     Head: Normocephalic and atraumatic.  Eyes:     Pupils: Pupils are equal, round, and reactive to light.  Cardiovascular:     Rate and Rhythm: Normal rate and regular rhythm.     Heart sounds: Normal heart sounds.  Pulmonary:     Effort: Pulmonary effort is normal.     Breath sounds: Normal breath sounds.  Abdominal:     General: Bowel sounds are normal.     Palpations: Abdomen is soft.  Musculoskeletal:        General: No tenderness or deformity. Normal range of motion.     Cervical back: Normal range of motion.     Comments: She has about 2+ edema in the lower legs.  I cannot palpate any venous cord.  She has a negative Homans' sign.  Lymphadenopathy:     Cervical: No cervical adenopathy.  Skin:    General: Skin is warm and dry.     Findings: No erythema or rash.  Neurological:     Mental Status: She is alert and oriented to person, place, and time.  Psychiatric:        Behavior: Behavior normal.        Thought Content: Thought content normal.        Judgment: Judgment normal.      Lab Results  Component Value Date   WBC 5.3 02/27/2024   HGB 11.7 (L) 02/27/2024   HCT 34.2 (L) 02/27/2024   MCV 99.1 02/27/2024   PLT 183 02/27/2024     Chemistry      Component Value  Date/Time   NA 138 02/27/2024 0911   NA 142 07/17/2023 1358   NA 142 05/29/2017 1002   K 3.1 (L) 02/27/2024 0911   K 3.9 05/29/2017 1002   CL 95 (L) 02/27/2024 0911   CO2 28 02/27/2024 0911   CO2 28 05/29/2017 1002   BUN 27 (H) 02/27/2024 0911   BUN 26 07/17/2023 1358   BUN 13.5 05/29/2017 1002   CREATININE 1.10 (H) 02/27/2024 0911   CREATININE 0.81 04/06/2020 1059   CREATININE 0.8 05/29/2017 1002      Component Value Date/Time   CALCIUM  11.3 (H) 02/27/2024 0911   CALCIUM  9.4 05/29/2017 1002   ALKPHOS 75 02/27/2024 0911   ALKPHOS 39 (L) 05/29/2017 1002   AST 23 02/27/2024 0911   AST 18 05/29/2017 1002   ALT 28 02/27/2024 0911   ALT 19 05/29/2017 1002   BILITOT 0.3 02/27/2024 0911   BILITOT 0.48 05/29/2017 1002       Impression and Plan: [name] is a very charming 73 year old white female.  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  This was actually a small lesion.  She completed radiotherapy to the right hip recently.  Again, the PET scan hopefully will just show that this is just from radiotherapy.  I am sure we will have to repeat another PET scan down the road.  As far as her trauma, I really think we need to get her on low-dose anticoagulation for her trip.  I do think this would be a very reasonable way to try to help prevent her  from having thromboembolic disease.  I do worry that given that she has metastatic breast cancer that she would be at risk for thromboembolic disease with travel.  I believe that Xarelto  10 mg daily for about the next month would be reasonable.  I think this would provide some good protection for her.  We will going give her a dose of Xgeva  today.  I think this would be helpful.  We we will have her come back to see us  in about 6 weeks.  I would like to get her back after she gets back from her vacation.  She does not need any Aranesp  today.  Her iron  studies look okay.  Hopefully, there will right hip will  improve on the next PET scan.   Maude JONELLE Crease, MD 7/22/202510:13 AM

## 2024-02-27 NOTE — Patient Instructions (Signed)
 Denosumab Injection (Oncology) What is this medication? DENOSUMAB (den oh SUE mab) prevents weakened bones caused by cancer. It may also be used to treat noncancerous bone tumors that cannot be removed by surgery. It can also be used to treat high calcium levels in the blood caused by cancer. It works by blocking a protein that causes bones to break down quickly. This slows down the release of calcium from bones, which lowers calcium levels in your blood. It also makes your bones stronger and less likely to break (fracture). This medicine may be used for other purposes; ask your health care provider or pharmacist if you have questions. COMMON BRAND NAME(S): XGEVA What should I tell my care team before I take this medication? They need to know if you have any of these conditions: Dental disease Having surgery or tooth extraction Infection Kidney disease Low levels of calcium or vitamin D in the blood Malnutrition On hemodialysis Skin conditions or sensitivity Thyroid or parathyroid disease An unusual reaction to denosumab, other medications, foods, dyes, or preservatives Pregnant or trying to get pregnant Breast-feeding How should I use this medication? This medication is for injection under the skin. It is given by your care team in a hospital or clinic setting. A special MedGuide will be given to you before each treatment. Be sure to read this information carefully each time. Talk to your care team about the use of this medication in children. While it may be prescribed for children as young as 13 years for selected conditions, precautions do apply. Overdosage: If you think you have taken too much of this medicine contact a poison control center or emergency room at once. NOTE: This medicine is only for you. Do not share this medicine with others. What if I miss a dose? Keep appointments for follow-up doses. It is important not to miss your dose. Call your care team if you are unable to  keep an appointment. What may interact with this medication? Do not take this medication with any of the following: Other medications containing denosumab This medication may also interact with the following: Medications that lower your chance of fighting infection Steroid medications, such as prednisone or cortisone This list may not describe all possible interactions. Give your health care provider a list of all the medicines, herbs, non-prescription drugs, or dietary supplements you use. Also tell them if you smoke, drink alcohol, or use illegal drugs. Some items may interact with your medicine. What should I watch for while using this medication? Your condition will be monitored carefully while you are receiving this medication. You may need blood work while taking this medication. This medication may increase your risk of getting an infection. Call your care team for advice if you get a fever, chills, sore throat, or other symptoms of a cold or flu. Do not treat yourself. Try to avoid being around people who are sick. You should make sure you get enough calcium and vitamin D while you are taking this medication, unless your care team tells you not to. Discuss the foods you eat and the vitamins you take with your care team. Some people who take this medication have severe bone, joint, or muscle pain. This medication may also increase your risk for jaw problems or a broken thigh bone. Tell your care team right away if you have severe pain in your jaw, bones, joints, or muscles. Tell your care team if you have any pain that does not go away or that gets worse. Talk  to your care team if you may be pregnant. Serious birth defects can occur if you take this medication during pregnancy and for 5 months after the last dose. You will need a negative pregnancy test before starting this medication. Contraception is recommended while taking this medication and for 5 months after the last dose. Your care team  can help you find the option that works for you. What side effects may I notice from receiving this medication? Side effects that you should report to your care team as soon as possible: Allergic reactions--skin rash, itching, hives, swelling of the face, lips, tongue, or throat Bone, joint, or muscle pain Low calcium level--muscle pain or cramps, confusion, tingling, or numbness in the hands or feet Osteonecrosis of the jaw--pain, swelling, or redness in the mouth, numbness of the jaw, poor healing after dental work, unusual discharge from the mouth, visible bones in the mouth Side effects that usually do not require medical attention (report to your care team if they continue or are bothersome): Cough Diarrhea Fatigue Headache Nausea This list may not describe all possible side effects. Call your doctor for medical advice about side effects. You may report side effects to FDA at 1-800-FDA-1088. Where should I keep my medication? This medication is given in a hospital or clinic. It will not be stored at home. NOTE: This sheet is a summary. It may not cover all possible information. If you have questions about this medicine, talk to your doctor, pharmacist, or health care provider.  2024 Elsevier/Gold Standard (2021-12-15 00:00:00)

## 2024-02-28 ENCOUNTER — Encounter: Payer: Self-pay | Admitting: *Deleted

## 2024-03-01 ENCOUNTER — Encounter: Payer: Self-pay | Admitting: Hematology & Oncology

## 2024-03-01 DIAGNOSIS — G4733 Obstructive sleep apnea (adult) (pediatric): Secondary | ICD-10-CM | POA: Diagnosis not present

## 2024-03-11 ENCOUNTER — Other Ambulatory Visit: Payer: Self-pay | Admitting: Hematology & Oncology

## 2024-03-11 ENCOUNTER — Other Ambulatory Visit: Payer: Self-pay

## 2024-03-11 ENCOUNTER — Other Ambulatory Visit (HOSPITAL_BASED_OUTPATIENT_CLINIC_OR_DEPARTMENT_OTHER): Payer: Self-pay | Admitting: Cardiovascular Disease

## 2024-03-11 ENCOUNTER — Other Ambulatory Visit (HOSPITAL_COMMUNITY): Payer: Self-pay

## 2024-03-11 DIAGNOSIS — C3490 Malignant neoplasm of unspecified part of unspecified bronchus or lung: Secondary | ICD-10-CM

## 2024-03-11 MED ORDER — LORLATINIB 100 MG PO TABS
100.0000 mg | ORAL_TABLET | Freq: Every day | ORAL | 0 refills | Status: DC
  Filled 2024-03-11: qty 30, 30d supply, fill #0

## 2024-03-11 NOTE — Progress Notes (Signed)
 Specialty Pharmacy Refill Coordination Note  Laura Bush is a 73 y.o. female contacted today regarding refills of specialty medication(s) Lorlatinib  (LORBRENA )   Patient requested [facility] at [facility] [place] date: 03/13/24   Medication will be filled on 03/12/24.

## 2024-03-12 ENCOUNTER — Other Ambulatory Visit: Payer: Self-pay

## 2024-03-15 ENCOUNTER — Other Ambulatory Visit: Payer: Self-pay | Admitting: Family Medicine

## 2024-03-15 DIAGNOSIS — K219 Gastro-esophageal reflux disease without esophagitis: Secondary | ICD-10-CM

## 2024-03-24 ENCOUNTER — Other Ambulatory Visit: Payer: Self-pay | Admitting: Hematology & Oncology

## 2024-03-25 ENCOUNTER — Encounter: Payer: Self-pay | Admitting: Hematology & Oncology

## 2024-04-05 ENCOUNTER — Other Ambulatory Visit: Payer: Self-pay

## 2024-04-05 ENCOUNTER — Other Ambulatory Visit: Payer: Self-pay | Admitting: Hematology & Oncology

## 2024-04-05 ENCOUNTER — Encounter (INDEPENDENT_AMBULATORY_CARE_PROVIDER_SITE_OTHER): Payer: Self-pay

## 2024-04-05 DIAGNOSIS — C3490 Malignant neoplasm of unspecified part of unspecified bronchus or lung: Secondary | ICD-10-CM

## 2024-04-05 MED ORDER — LORLATINIB 100 MG PO TABS
100.0000 mg | ORAL_TABLET | Freq: Every day | ORAL | 0 refills | Status: DC
  Filled 2024-04-09: qty 30, 30d supply, fill #0

## 2024-04-08 ENCOUNTER — Other Ambulatory Visit: Payer: Self-pay | Admitting: Hematology & Oncology

## 2024-04-09 ENCOUNTER — Other Ambulatory Visit: Payer: Self-pay | Admitting: Pharmacy Technician

## 2024-04-09 ENCOUNTER — Other Ambulatory Visit: Payer: Self-pay

## 2024-04-09 NOTE — Progress Notes (Signed)
 Specialty Pharmacy Refill Coordination Note  Laura Bush is a 73 y.o. female contacted today regarding refills of specialty medication(s)  Lorlatinib  (LORBRENA )     Patient requested Delivery Delivery date: 04/11/24  Verified address: [address] [place] [address]   Medication will be filled on 04/10/24.   Patient answered questionnaire.

## 2024-04-10 ENCOUNTER — Other Ambulatory Visit: Payer: Self-pay

## 2024-04-10 ENCOUNTER — Other Ambulatory Visit: Payer: Self-pay | Admitting: Hematology & Oncology

## 2024-04-10 DIAGNOSIS — G4733 Obstructive sleep apnea (adult) (pediatric): Secondary | ICD-10-CM | POA: Diagnosis not present

## 2024-04-17 ENCOUNTER — Encounter: Payer: Self-pay | Admitting: *Deleted

## 2024-04-17 DIAGNOSIS — X58XXXD Exposure to other specified factors, subsequent encounter: Secondary | ICD-10-CM | POA: Diagnosis not present

## 2024-04-17 DIAGNOSIS — C7951 Secondary malignant neoplasm of bone: Secondary | ICD-10-CM | POA: Diagnosis not present

## 2024-04-17 DIAGNOSIS — M80051D Age-related osteoporosis with current pathological fracture, right femur, subsequent encounter for fracture with routine healing: Secondary | ICD-10-CM | POA: Diagnosis not present

## 2024-04-17 DIAGNOSIS — M79604 Pain in right leg: Secondary | ICD-10-CM | POA: Diagnosis not present

## 2024-04-17 DIAGNOSIS — Z79899 Other long term (current) drug therapy: Secondary | ICD-10-CM | POA: Diagnosis not present

## 2024-04-17 NOTE — Progress Notes (Addendum)
 Division of Orthopaedic Oncology [facility]  Chief Orthopedic Oncology Complaint:  Right femoral neck lesion -  retrograde IMN placed @ [facility] when fractured 01/04/2022 (new dx of NSCLC) -  Radiation completed 03/04/2022 [facility]) -  Second round of radiation to right femur, completed May 2025 [facility])  H/o breast and NSCLC  Care team:  Med Onc  Arne, [place]), Rad Onc (Shannon, [place]), [facility] (Orthopedic surgeon @ [facility]), Ortho Onc surgeon (Dr. Huston, [facility])  Interval History, 04/17/24: Laura Bush returns today with increasing Right leg pain. Pain has been increasing over the last 3-4 months and has progressed to the point where she is using a walker to get around. Previously she just needed a cane. She has had significant functional decline. She has diffuse tenderness around her knee.  Additionally, she reports a car trip to [place]  10 days ago. Normally she takes anticoagulation prior to travel however this was an unexpected trip and since then she has had unilateral swelling.   Interval history, 01-31-2024:   Laura Bush returns today for her scheduled f/u, including review of her right femur XR.  She returns today due to an increase in her pain since (2nd) radiation, which she completed in late May 2025 at [facility]. She previously participated with a yoga class, and was able to get up and down from the floor prior to the fracture.   Pain assessment: Standing or walking can bring it on  Daily pain of her high thigh area w/ intensity of 3-6:10 but responsive to both Tylenol  and ibuprofen. She has a wonky gait without a cane, but is more steady and even with the cane.  History of Present Illness: Laura Bush is a 73 y.o. 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  She was initially seen by Dr. Huston 11-29-2023, as referred by Dr. Maude Crease and Dr. Celena [facility]) for further evaluation of Right femoral neck lesion. Patient reports that her lung adenocarcinoma and metastatic bone disease was found when she fractured through her femur. Had retrograde IMN placed, bone shavings were sent for biopsy that confirmed metastatic disease. Then received radiation to the Right femur + systemic biologic therapy. She had distal interlocking screws removed due to instrumentation pain. Has had regular PET bone scans to monitor. Right femoral neck lesion was found incidentally on 09/25/23 PET bone scan that was new compared to 07/02/2023 PET.   Regarding symptoms, patient reports that she has intermittent pain in her Right trochanteric bursa area that sometimes radiates anteriorly, not associated with activity. No nighttime pain, fever, chills, skin changes, or unintentional weight loss. Patient has had BLE swelling, although does feel like her Right side is a bit more swollen than the left. Of note, she had a tooth abscess with root canal and her recent scheduled Xgeva  dose was held, however has not had any infectious symptoms since her tooth abscess resolved. She is currently ambulating with a cane, mainly for stability not for pain.   Based on her history, exam, and imaging, Dr. Huston was concerned this could be another site of metastasis given the mixed/blastic nature of her other femur met. At the present time she does not have any mechanical pain, and there is not concerning lytic/destruction of the bone in this region- rather blastic appearance. Given this, we discussed  continuing to monitor.  Additional radiation therapy focused at this lesion can be considered, as well as anti-resorptive therapy. We recommended that she can continue to follow with us  in our metastatic bone disease clinic and we will communicate with Dr. Timmy about our  recommendations.  [facility] Med Onc note, 01-23-2024, Dr. 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 3 months -next dose in 11/2023 -- on hold due to maxillary infection S/P SBRT to LUL -- completed on 03/04/2022 S/P XRT to RIGHT femur -- completed on 03/04/2022 Alecensa  600 mg po BID -- start on 03/24/2022 --on hold starting 04/04/2023 -restart 300 mg p.o. twice daily on 05/04/2023 --DC on 05/26/2023 Lorbrena  100 mg po q day -- start on 06/14/2023 Aranesp  300 mcg subcu every 3 weeks Venofer -given on 07/10/2023  OSH PREVIOUS RADIATION THERAPY (per Dr. Colton clinic note 12-07-2023) 02/16/2022 through 03/04/2022 Site Technique Total Dose (Gy) Dose per Fx (Gy) Completed Fx Beam Energies  Lung, Left: Lung_L IMRT 54/54 18 3/3 6XFFF  Femur Right: Ext_R 3D 24/24 3 8/8 10X  Femur Right: Ext_R_Bst IMRT 15/15 3 5/5 6X     Past Medical History: Past Medical History:  Diagnosis Date  . Anemia    Due to cancer  . Arthritis   . Asthma (HHS-HCC)   . Breast cancer (CMS/HHS-HCC)   . Diabetes mellitus without complication (CMS/HHS-HCC)   . GERD (gastroesophageal reflux disease)   . History of external beam radiation therapy   . History of stroke    Mini - no lasting effects  . Hypercholesteremia   . Hypertension 1995?  SABRA Lower extremity edema   . Microscopic colitis   . Rosacea   . Sleep apnea   . 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 Venous insufficiency     Past Surgical History: Past Surgical History:  Procedure Laterality Date  . APPENDECTOMY  2006  . BREAST BIOPSY  2016  . breast lumpectomy Left   . CESAREAN SECTION  1978, 1980  . ENDOSCOPIC CARPAL TUNNEL RELEASE    . HYSTERECTOMY  1999  . KNEE ARTHROSCOPY    . LYMPH  NODE BIOPSY  2016  . REDUCTION MAMMAPLASTY    . squamus cell excision    . TONSILLECTOMY    . TUBAL LIGATION  1983    Family History: Family History  Problem Relation Age of Onset  . Dementia Mother   . Hyperlipidemia (Elevated cholesterol) Mother   . High blood pressure (Hypertension) Mother   . Heart failure Father   . High blood pressure (Hypertension) Father   . Stroke Father   . Skin cancer Father   . Uterine cancer Sister   . High blood pressure (Hypertension) Maternal Grandfather   . Myocardial Infarction (Heart attack) Maternal Grandfather   . Stroke Paternal Grandfather    Social History: Social History   Socioeconomic History  . Marital status: Married  Tobacco Use  . Smoking status: Never  . Smokeless tobacco: Never  Vaping Use  . Vaping status: Never Used  Substance and Sexual Activity  . Drug use: Never   Social Drivers of Corporate investment banker Strain: Low Risk  (01/25/2024)   Received from [facility]   Overall Financial Resource Strain (CARDIA)   . How hard  is it for you to pay for the very basics like food, housing, medical care, and heating?: Not hard at all  Food Insecurity: No Food Insecurity (01/25/2024)   Received from [facility]   Hunger Vital Sign   . Within the past 12 months, you worried that your food would run out before you got the money to buy more.: Never true   . Within the past 12 months, the food you bought just didn't last and you didn't have money to get more.: Never true  Transportation Needs: No Transportation Needs (01/25/2024)   Received from [facility] - Transportation   . In the past 12 months, has lack of transportation kept you from medical appointments or from getting medications?: No   . In the past 12 months, has lack of transportation kept you from meetings, work, or from getting things needed for daily living?: No  Physical Activity: Inactive (01/25/2024)   Received from [facility]   Exercise Vital Sign    . On average, how many days per week do you engage in moderate to strenuous exercise (like a brisk walk)?: 0 days  Stress: No Stress Concern Present (01/25/2024)   Received from [facility] of Occupational [facility] - Occupational Stress Questionnaire   . Do you feel stress - tense, restless, nervous, or anxious, or unable to sleep at night because your mind is troubled all the time - these days?: Only a little  Social Connections: Socially Integrated (01/25/2024)   Received from [facility]   Social Connection and Isolation Panel   . In a typical week, how many times do you talk on the phone with family, friends, or neighbors?: More than three times a week   . How often do you get together with friends or relatives?: More than three times a week   . How often do you attend church or religious services?: More than 4 times per year   . Do you belong to any clubs or organizations such as church groups, unions, fraternal or athletic groups, or school groups?: Yes   . How often do you attend meetings of the clubs or organizations you belong to?: More than 4 times per year   . Are you married, widowed, divorced, separated, never married, or living with a partner?: Married  Housing Stability: Unknown (11/29/2023)   Housing Stability Vital Sign   . Homeless in the Last Year: No    Medications: Current Outpatient Medications  Medication Sig Dispense Refill  . albuterol -budesonide  90-80 mcg/actuation HFAA Inhale 2 inhalations into the lungs    . aMILoride  (MIDAMOR ) 5 MG tablet Take 1 tablet by mouth once daily    . aspirin 81 MG EC tablet Take 81 mg by mouth    . cholecalciferol (VITAMIN D3) 1000 unit tablet Take 1,000 Units by mouth every morning    . clindamycin  (CLEOCIN ) 150 MG capsule     . denosumab  (XGEVA ) 120 mg/1.7 mL (70 mg/mL) injection Inject subcutaneously    . fexofenadine (ALLEGRA) 180 MG tablet Take 180 mg by mouth    . folic acid  (FOLVITE ) 400 MCG tablet Take 400  mcg by mouth    . FUROsemide  (LASIX ) 20 MG tablet Take Lasix  two tablets (20 mg each) once a day    . ipratropium (ATROVENT ) 0.06 % nasal spray     . KLOR-CON  M20 20 mEq ER tablet     . lorlatinib  (LORBRENA ) 100 mg tablet Take 100 mg by  mouth    . metFORMIN  (GLUCOPHAGE ) 500 MG tablet Take 1 tablet by mouth once daily    . metOLazone  (ZAROXOLYN ) 5 MG tablet Take 5 mg by mouth once daily    . MOUNJARO  5 mg/0.5 mL pen injector Inject 5 mg subcutaneously every 7 (seven) days    . NIFEdipine  (PROCARDIA -XL) 60 MG (OSM) XL tablet Take 60 mg by mouth once daily    . pantoprazole  (PROTONIX ) 40 MG DR tablet Take 40 mg by mouth once daily    . pravastatin  (PRAVACHOL ) 20 MG tablet     . REPATHA  SURECLICK 140 mg/mL PnIj Inject 140 mg subcutaneously    . spironolactone  (ALDACTONE ) 25 MG tablet Take 1 tablet by mouth once daily    . traZODone  (DESYREL ) 50 MG tablet Take 1 tablet by mouth at bedtime as needed    . XARELTO  10 mg tablet Take 10 mg by mouth once daily    . valsartan  (DIOVAN ) 80 MG tablet Take 1 tablet by mouth once daily (Patient not taking: Reported on 04/17/2024)     No current facility-administered medications for this visit.   Allergies: Allergies  Allergen Reactions  . Diclofenac  Other (See Comments)    Other Reaction(s): Hypertension  . Amlodipine  Other (See Comments) and Swelling    Limbs swell, not the throat  . Lanolin Other (See Comments) and Unknown    Other Reaction(s): Other (See Comments)    Sneezing and watery eyes. Allergic to Wool.  Sneezing and watery eyes. Allergic to Wool.  . Adhesive Tape-Silicones Other (See Comments) and Rash    Redness (Bandaids also)     Review of Systems: A ROS was performed including pertinent positives and negatives as documented in the HPI.  Physical Exam: .BP 104/63 (BP Location: Left upper arm, Patient Position: Sitting)   Pulse 90   Temp 36.8 C (98.2 F) (Oral)   Resp 18   Ht 150 cm (4' 11.06)   Wt 84.1 kg (185 lb 6.5 oz)    SpO2 96%   BMI 37.38 kg/m  General/Constitutional: No apparent distress: well-nourished and well developed. Respiratory: Non-labored breathing Vascular: No edema, swelling or tenderness, except as noted in detailed exam. Neurological:  Oriented to person, place, and time. Psychological:  Normal mood and affect. Musculoskeletal: Normal, except as noted in detailed exam and in HPI. Focused examination of the Right Lower Extremity reveals normal hip flexion strength and ROM, neurovascularly intact, Right lower extremity pitting edema, normal bilateral sensation. Diffusely tender to palpation. Pain with varus/valgus stress.  Walks with antalgic gait (steadied w/ cane)  Imaging: I personally reviewed the following images:  XR pelvis, right femur   01-31-2024 FINDINGS:  Redemonstrated pathologic fracture of the mid femoral diaphysis status post intramedullary nail and screw fixation. Callus formation across the fracture, compatible with healing. No evidence of hardware complication. Permeative appearance of the bone surrounding the fracture has progressed from the prior radiographs on 11/29/2023. The lytic lesion extending from the fracture throughout the remainder of the distal femoral diaphysis is increased in size with increased endosteal scalloping and periosteal reaction throughout the distal femur. Mixed lytic and sclerotic lesion in the right greater trochanter with extension into the right femoral neck is poorly visualized but grossly unchanged from a prior radiographs on 11/29/2023. Tricompartmental arthrosis of the right knee, unchanged. Small effusion. Degenerative sclerosis of the bilateral SI joints. IMPRESSION: Redemonstrated pathologic fracture of the mid femoral diaphysis status post with evidence of progression adjacent to the fracture and extending throughout the  distal femoral diaphysis. Mixed lytic and sclerotic lesion in the right greater trochanter with extension of the right femoral  neck is poorly visualized but grossly unchanged from the prior radiographs on 11/29/2023.   11/29/23 X-ray Right Femur: S/p Right retrograde IMN in place with healing pathologic fracture in the diaphysis.  Blastic leison in the femoral neck likely consistent with foci of metastatic disease.  04/17/24 X-ray Right Femur: S/p Right retrograde femoral nail. There appears to be increased lysis of the bone surrounding the nail and distally in the meta diaphyseal region.     Assessment:  This is a 73 y.o. 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   She presents today with worsening pain and radiographs concerning for tumor progression in the right femur. She also has increasing swelling in the leg concerning for DVT, especially in the setting of recent travel.   Visit Diagnosis   ICD-10-CM  1. Metastatic cancer to bone (CMS/HHS-HCC)  C79.51  2. Right leg pain  M79.604  3. Pathological fracture of right femur due to age-related osteoporosis with routine healing, subsequent encounter  M80.051D    Plan: -  Ultrasound DVT today -  Obtain recent PET CT from OSH -  Pending review of PET CT we will consider further imaging of the Right leg (possible R femur MRI) -  If no evidence of tumor progression we will refer for treatment of her arthritis (Ultrasound guided CSI to R knee)   Jayson Genta, MD PGY-5, Orthopedic Surgery  ADDENDUM, 04-24-2024:  I spoke to [name] & Darina that we reviewed her case and all related images at our [facility] (radiology) conference.  This conference is attended by [facility] radiologists, orthopedic oncology surgeons, radiation oncologists,  interventional radiologist and other related disciplines. Her right mid and distal femur show active disease. The recommended plan of care was discussed; -  radiation therapy to the mid/distal right femur due to more cortical loss, including a fracture line.  04-25-2024:  I spoke to [facility] (RN w/ Dr. Shannon) who will get this message to Dr. Shannon and they will contact Ms. [place].   We will fax this office note to them at [phone].     SHARLET JINNY PETER, NP Office: [phone] Fax: [phone] Division of Orthopaedic Oncology       Attestation Statement:   I personally saw and evaluated the patient, and participated in the management and treatment plan as documented in the resident/fellow note.  JULIA STEPHANE LEAK, MD    Recardo LEAK, MD Department of Orthopaedic Surgery Division of Orthopaedic Oncology Office: [phone] Fax:  [phone]

## 2024-04-19 ENCOUNTER — Other Ambulatory Visit: Payer: Self-pay | Admitting: Family Medicine

## 2024-04-19 ENCOUNTER — Other Ambulatory Visit: Payer: Self-pay | Admitting: Hematology & Oncology

## 2024-04-19 DIAGNOSIS — R7303 Prediabetes: Secondary | ICD-10-CM

## 2024-04-23 ENCOUNTER — Inpatient Hospital Stay: Attending: Hematology & Oncology

## 2024-04-23 ENCOUNTER — Inpatient Hospital Stay

## 2024-04-23 ENCOUNTER — Other Ambulatory Visit (HOSPITAL_BASED_OUTPATIENT_CLINIC_OR_DEPARTMENT_OTHER): Payer: Self-pay

## 2024-04-23 ENCOUNTER — Encounter: Payer: Self-pay | Admitting: Hematology & Oncology

## 2024-04-23 ENCOUNTER — Other Ambulatory Visit: Payer: Self-pay

## 2024-04-23 ENCOUNTER — Inpatient Hospital Stay: Admitting: Hematology & Oncology

## 2024-04-23 VITALS — BP 121/60 | HR 66 | Temp 98.6°F | Resp 18 | Ht 58.5 in | Wt 184.0 lb

## 2024-04-23 DIAGNOSIS — C7951 Secondary malignant neoplasm of bone: Secondary | ICD-10-CM | POA: Insufficient documentation

## 2024-04-23 DIAGNOSIS — N183 Anemia in chronic kidney disease: Secondary | ICD-10-CM

## 2024-04-23 DIAGNOSIS — C3432 Malignant neoplasm of lower lobe, left bronchus or lung: Secondary | ICD-10-CM | POA: Diagnosis not present

## 2024-04-23 DIAGNOSIS — D509 Iron deficiency anemia, unspecified: Secondary | ICD-10-CM | POA: Insufficient documentation

## 2024-04-23 DIAGNOSIS — C3490 Malignant neoplasm of unspecified part of unspecified bronchus or lung: Secondary | ICD-10-CM | POA: Diagnosis not present

## 2024-04-23 LAB — CBC WITH DIFFERENTIAL (CANCER CENTER ONLY)
Abs Immature Granulocytes: 0.14 K/uL — ABNORMAL HIGH (ref 0.00–0.07)
Basophils Absolute: 0 K/uL (ref 0.0–0.1)
Basophils Relative: 1 %
Eosinophils Absolute: 0.7 K/uL — ABNORMAL HIGH (ref 0.0–0.5)
Eosinophils Relative: 10 %
HCT: 32.9 % — ABNORMAL LOW (ref 36.0–46.0)
Hemoglobin: 11.2 g/dL — ABNORMAL LOW (ref 12.0–15.0)
Immature Granulocytes: 2 %
Lymphocytes Relative: 16 %
Lymphs Abs: 1.1 K/uL (ref 0.7–4.0)
MCH: 33.9 pg (ref 26.0–34.0)
MCHC: 34 g/dL (ref 30.0–36.0)
MCV: 99.7 fL (ref 80.0–100.0)
Monocytes Absolute: 0.7 K/uL (ref 0.1–1.0)
Monocytes Relative: 10 %
Neutro Abs: 4.1 K/uL (ref 1.7–7.7)
Neutrophils Relative %: 61 %
Platelet Count: 207 K/uL (ref 150–400)
RBC: 3.3 MIL/uL — ABNORMAL LOW (ref 3.87–5.11)
RDW: 13.2 % (ref 11.5–15.5)
WBC Count: 6.6 K/uL (ref 4.0–10.5)
nRBC: 0 % (ref 0.0–0.2)

## 2024-04-23 LAB — CMP (CANCER CENTER ONLY)
ALT: 18 U/L (ref 0–44)
AST: 20 U/L (ref 15–41)
Albumin: 4.5 g/dL (ref 3.5–5.0)
Alkaline Phosphatase: 64 U/L (ref 38–126)
Anion gap: 14 (ref 5–15)
BUN: 30 mg/dL — ABNORMAL HIGH (ref 8–23)
CO2: 27 mmol/L (ref 22–32)
Calcium: 10.3 mg/dL (ref 8.9–10.3)
Chloride: 97 mmol/L — ABNORMAL LOW (ref 98–111)
Creatinine: 1.12 mg/dL — ABNORMAL HIGH (ref 0.44–1.00)
GFR, Estimated: 52 mL/min — ABNORMAL LOW (ref >60)
Glucose, Bld: 100 mg/dL — ABNORMAL HIGH (ref 70–99)
Potassium: 3.2 mmol/L — ABNORMAL LOW (ref 3.5–5.1)
Sodium: 138 mmol/L (ref 135–145)
Total Bilirubin: 0.3 mg/dL (ref 0.0–1.2)
Total Protein: 7.7 g/dL (ref 6.5–8.1)

## 2024-04-23 LAB — IRON AND IRON BINDING CAPACITY (CC-WL,HP ONLY)
Iron: 72 ug/dL (ref 28–170)
Saturation Ratios: 25 % (ref 10.4–31.8)
TIBC: 284 ug/dL (ref 250–450)
UIBC: 212 ug/dL

## 2024-04-23 LAB — RETICULOCYTES
Immature Retic Fract: 13.4 % (ref 2.3–15.9)
RBC.: 3.33 MIL/uL — ABNORMAL LOW (ref 3.87–5.11)
Retic Count, Absolute: 64.9 K/uL (ref 19.0–186.0)
Retic Ct Pct: 2 % (ref 0.4–3.1)

## 2024-04-23 LAB — FERRITIN: Ferritin: 435 ng/mL — ABNORMAL HIGH (ref 11–307)

## 2024-04-23 LAB — LACTATE DEHYDROGENASE: LDH: 168 U/L (ref 98–192)

## 2024-04-23 MED ORDER — OXAPROZIN 600 MG PO TABS: 600.0000 mg | ORAL_TABLET | Freq: Every day | ORAL | 4 refills | Status: DC

## 2024-04-23 MED ORDER — COMIRNATY 30 MCG/0.3ML IM SUSY
0.3000 mL | PREFILLED_SYRINGE | Freq: Once | INTRAMUSCULAR | 0 refills | Status: AC
  Filled 2024-04-23: qty 0.3, 1d supply, fill #0

## 2024-04-23 NOTE — Progress Notes (Signed)
 Hematology and Oncology Follow Up Visit  Laura Bush [id number] [birth year] 73 y.o. 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 3 months  -next dose in 05/2024    S/P SBRT to LUL -- completed on 03/04/2022 S/P XRT to RIGHT femur -- completed on 03/04/2022 Alecensa  600 mg po BID -- start on 03/24/2022 --on hold starting 04/04/2023 -restart 300 mg p.o. twice daily on 05/04/2023 --DC on 05/26/2023 Lorbrena  100 mg po q day  -- start on 06/14/2023 Aranesp  300 mcg subcu every 3 weeks Venofer -given on 07/10/2023     Interim History:  [name] is back for follow-up.  She and her husband had a wonderful time on their vacation.  [place].  [place] time out west.  After that, they came back home and then went up to [place] .  That a great time up in [place] .  I am so happy that she did well.  Her right hip does not bother her all that much.  Think she saw the Orthopedist at [facility] a week or so ago.  They are worried that she may have a meniscus tear in the right knee.  She is set up for an MRI to see if that is the case.  We will try her on Daypro  (600 mg p.o. daily) to see if this may help with any type of inflammatory pain.  She has had no problems with the Xarelto  while she was on vacation.  I am glad that she took the Xarelto .  Of note, I do she did have a Doppler of her legs which were negative.  She has had no fever.  She has had no cough or shortness of breath.  She has had no headache.  She has had no rashes..  She still has some leg swelling.  Her weight is coming down.  I think that she probably should stay on the metolazone .  Currently, I would have to say that her performance status is probably ECOG 1.   Medications:  Current  Outpatient Medications:    acetaminophen  (TYLENOL ) 500 MG tablet, Take 1 tablet (500 mg total) by mouth every 8 (eight) hours as needed for mild pain or moderate pain., Disp: 30 tablet, Rfl: 0   AIRSUPRA  90-80 MCG/ACT AERO, INHALE 2 PUFFS BY MOUTH EVERY 6 HOURS AS NEEDED, Disp: 10.7 g, Rfl: 1   aspirin EC 81 MG tablet, Take 81 mg by mouth in the morning. Swallow whole., Disp: , Rfl:    B Complex-C (SUPER B COMPLEX PO), Take 1 tablet by mouth daily with breakfast., Disp: , Rfl:    Calcium  Carb-Cholecalciferol (CALCIUM  + D3 PO), Take 1 tablet by mouth in the morning., Disp: , Rfl:    cholecalciferol (VITAMIN D3) 25 MCG (1000 UNIT) tablet, Take 1,000 Units by mouth in the morning., Disp: , Rfl:    COLLAGEN PO, Take 3 tablets by mouth in the morning and at bedtime. With Biotin, Disp: , Rfl:    Denosumab  (XGEVA  [place]), Inject into the skin every 3 (three) months., Disp: , Rfl:    Evolocumab  (REPATHA  SURECLICK) 140 MG/ML SOAJ, Inject 140 mg into the skin every 14 (fourteen) days., Disp: 6 mL, Rfl: 3   fexofenadine (ALLEGRA) 180 MG tablet, Take 180 mg by  mouth in the morning., Disp: , Rfl:    folic acid  (FOLATE) 400 MCG tablet, Take 400 mcg by mouth in the morning., Disp: , Rfl:    furosemide  (LASIX ) 20 MG tablet, TAKE 2 TABLETS BY MOUTH DAILY, Disp: 180 tablet, Rfl: 3   ibuprofen (ADVIL) 200 MG tablet, Take 600 mg by mouth daily as needed., Disp: , Rfl:    ipratropium (ATROVENT ) 0.06 % nasal spray, Place 2 sprays into both nostrils 4 (four) times daily as needed for rhinitis., Disp: 15 mL, Rfl: 4   lorlatinib  (LORBRENA ) 100 MG tablet, Take 1 tablet (100 mg total) by mouth daily. Swallow tablets whole. Do not chew, crush or split tablets., Disp: 30 tablet, Rfl: 0   metFORMIN  (GLUCOPHAGE ) 500 MG tablet, Take 1 tablet by mouth daily, Disp: 90 tablet, Rfl: 3   metolazone  (ZAROXOLYN ) 5 MG tablet, Take 1 tablet (5 mg total) by mouth daily. Take Zaroxolyn  about 30-45 minutes prior to Lasix  each day., Disp: 30  tablet, Rfl: 4   NIFEdipine  (PROCARDIA  XL/NIFEDICAL XL) 60 MG 24 hr tablet, TAKE 1 TABLET BY MOUTH DAILY, Disp: 90 tablet, Rfl: 3   pantoprazole  (PROTONIX ) 40 MG tablet, TAKE 1 TABLET BY MOUTH DAILY, Disp: 90 tablet, Rfl: 0   potassium chloride  SA (KLOR-CON  M) 20 MEQ tablet, TAKE 1 TABLET BY MOUTH 2 TIMES A DAY, Disp: 60 tablet, Rfl: 5   spironolactone  (ALDACTONE ) 25 MG tablet, Take 1 tablet (25 mg total) by mouth daily., Disp: 90 tablet, Rfl: 3   tirzepatide  (MOUNJARO ) 5 MG/0.5ML Pen, Inject 5 mg into the skin once a week., Disp: 2 mL, Rfl: 0   traZODone  (DESYREL ) 50 MG tablet, TAKE 1 TABLET BY MOUTH EVERY NIGHT AT BEDTIME AS NEEDED FOR SLEEP, Disp: 90 tablet, Rfl: 2   valsartan  (DIOVAN ) 80 MG tablet, TAKE 1 TABLET BY MOUTH DAILY, Disp: 90 tablet, Rfl: 1   XARELTO  10 MG TABS tablet, TAKE 1 TABLET BY MOUTH DAILY, Disp: 30 tablet, Rfl: 0  Allergies:  Allergies  Allergen Reactions   Diclofenac  Hypertension and Other (See Comments)    Other Reaction(s): Hypertension   Amlodipine  Swelling and Other (See Comments)    Limbs swell, not the throat   Lanolin Other (See Comments)    Sneezing and watery eyes. Allergic to Wool.  Other Reaction(s): Other (See Comments)  Sneezing and watery eyes. Allergic to Wool.    Sneezing and watery eyes. Allergic to Wool.   Silicone Dermatitis and Other (See Comments)    Redness (Bandaids also)   Tape Other (See Comments)    Redness (Bandaids also)    Past Medical History, Surgical history, Social history, and Family History were reviewed and updated.  Review of Systems: Review of Systems  Constitutional: Negative.   HENT:  Negative.    Eyes: Negative.   Respiratory: Negative.    Cardiovascular: Negative.   Gastrointestinal: Negative.   Endocrine: Negative.   Genitourinary: Negative.    Musculoskeletal:  Positive for arthralgias.  Skin: Negative.   Neurological: Negative.   Hematological: Negative.   Psychiatric/Behavioral: Negative.       Physical Exam: Vital signs show temperature of 98.6.  Pulse 66.  Blood pressure 121/60.  Weight is 184 pounds.   Wt Readings from Last 3 Encounters:  02/27/24 186 lb (84.4 kg)  02/22/24 186 lb (84.4 kg)  02/05/24 191 lb 6.4 oz (86.8 kg)    Physical Exam Vitals reviewed.  HENT:     Head: Normocephalic and atraumatic.  Eyes:  Pupils: Pupils are equal, round, and reactive to light.  Cardiovascular:     Rate and Rhythm: Normal rate and regular rhythm.     Heart sounds: Normal heart sounds.  Pulmonary:     Effort: Pulmonary effort is normal.     Breath sounds: Normal breath sounds.  Abdominal:     General: Bowel sounds are normal.     Palpations: Abdomen is soft.  Musculoskeletal:        General: No tenderness or deformity. Normal range of motion.     Cervical back: Normal range of motion.     Comments: She has about 2+ edema in the lower legs.  I cannot palpate any venous cord.  She has a negative Homans' sign.  Lymphadenopathy:     Cervical: No cervical adenopathy.  Skin:    General: Skin is warm and dry.     Findings: No erythema or rash.  Neurological:     Mental Status: She is alert and oriented to person, place, and time.  Psychiatric:        Behavior: Behavior normal.        Thought Content: Thought content normal.        Judgment: Judgment normal.      Lab Results  Component Value Date   WBC 6.6 04/23/2024   HGB 11.2 (L) 04/23/2024   HCT 32.9 (L) 04/23/2024   MCV 99.7 04/23/2024   PLT 207 04/23/2024     Chemistry      Component Value Date/Time   NA 138 04/23/2024 0907   NA 142 07/17/2023 1358   NA 142 05/29/2017 1002   K 3.2 (L) 04/23/2024 0907   K 3.9 05/29/2017 1002   CL 97 (L) 04/23/2024 0907   CO2 27 04/23/2024 0907   CO2 28 05/29/2017 1002   BUN 30 (H) 04/23/2024 0907   BUN 26 07/17/2023 1358   BUN 13.5 05/29/2017 1002   CREATININE 1.12 (H) 04/23/2024 0907   CREATININE 0.81 04/06/2020 1059   CREATININE 0.8 05/29/2017 1002       Component Value Date/Time   CALCIUM  10.3 04/23/2024 0907   CALCIUM  9.4 05/29/2017 1002   ALKPHOS 64 04/23/2024 0907   ALKPHOS 39 (L) 05/29/2017 1002   AST 20 04/23/2024 0907   AST 18 05/29/2017 1002   ALT 18 04/23/2024 0907   ALT 19 05/29/2017 1002   BILITOT 0.3 04/23/2024 0907   BILITOT 0.48 05/29/2017 1002       Impression and Plan: [name] is a very charming 73 year old white female.  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  This was actually a small lesion.  Again, I am glad that she had a wonderful time on her vacation.  She had no complications.  I think the issue now is when to do another PET scan.  I think her last PET scan was probably back in July.  Probably need to get another 1 set up for October.  It will be interesting to see what the MRI of the right knee shows.  Hopefully, we will see that she still has a good remission with a Lorbrena .  I am happy that she is doing okay.  Hopefully, we get some more of the swelling out of her legs.  I will plan to get her back to see us  in about a month or so.   Maude JONELLE Crease, MD 9/16/202510:24 AM

## 2024-04-24 ENCOUNTER — Encounter: Payer: Self-pay | Admitting: Hematology & Oncology

## 2024-04-25 ENCOUNTER — Telehealth: Payer: Self-pay

## 2024-04-25 ENCOUNTER — Other Ambulatory Visit: Payer: Self-pay

## 2024-04-25 ENCOUNTER — Other Ambulatory Visit: Payer: Self-pay | Admitting: Hematology & Oncology

## 2024-04-25 DIAGNOSIS — C7951 Secondary malignant neoplasm of bone: Secondary | ICD-10-CM

## 2024-04-25 MED ORDER — LORLATINIB 100 MG PO TABS
100.0000 mg | ORAL_TABLET | Freq: Every day | ORAL | 0 refills | Status: DC
  Filled 2024-04-25: qty 30, 30d supply, fill #0

## 2024-04-25 NOTE — Progress Notes (Signed)
 Specialty Pharmacy Ongoing Clinical Assessment Note  Laura Bush is a 73 y.o. female who is being followed by the specialty pharmacy service for RxSp Oncology   Patient's specialty medication(s) reviewed today: Lorlatinib  (LORBRENA )   Missed doses in the last 4 weeks: 0   Patient/Caregiver did not have any additional questions or concerns.   Therapeutic benefit summary: Patient is achieving benefit   Adverse events/side effects summary: No adverse events/side effects   Patient's therapy is appropriate to: Continue    Goals Addressed             This Visit's Progress    Slow Disease Progression   On track    Patient is on track. Patient will maintain adherence. Per visit on 7/22, PET scan showed some activity in the right femoral neck, but Dr. Shannon is watching for now. Plan to repeat PET scan in October          Follow up: 3 months  [facility] Specialty Pharmacist

## 2024-04-25 NOTE — Progress Notes (Signed)
 Clinical Intervention Note  Clinical Intervention Notes: Patient recently starting taking Daypro  due to pain in her leg. No DDIs identified with Lorbrena    Clinical Intervention Outcomes: Prevention of an adverse drug event   Advertising account planner

## 2024-04-25 NOTE — Progress Notes (Signed)
 Specialty Pharmacy Refill Coordination Note  Laura Bush is a 73 y.o. female contacted today regarding refills of specialty medication(s) Lorlatinib  (LORBRENA )   Patient requested Delivery   Delivery date: 05/06/24   Verified address: [address] [place] [address]   Medication will be filled on 05/03/24. This fill date is pending response to refill request from provider. Patient is aware and if they have not received fill by intended date they must follow up with pharmacy.

## 2024-04-25 NOTE — Telephone Encounter (Signed)
 Received a call from [facility] from [facility] oncology. Per Pam NP patient reported new right leg pain,  scan was obtained and shows new fracture and progressive disease to the right distal femur.  Pam NP to send over office noted an scans and suggest that patient be seen by Dr. Shannon asap so that radiation ( full beamer radiation)  can be started to this area .

## 2024-04-25 NOTE — Progress Notes (Signed)
 Histology and Location of Primary Cancer:  Left Lung   Sites of Visceral and Bony Metastatic Disease:   Location(s) of Symptomatic Metastases:  Right distal femur  Past/Anticipated chemotherapy by medical oncology, if any:    Pain on a scale of 0-10 is: 0     Ambulatory status? Walker? Wheelchair?: Ambulatory with Assistance   SAFETY ISSUES: Prior radiation? yes Pacemaker/ICD? no Possible current pregnancy? no Is the patient on methotrexate? no  Current Complaints / other details:    BP 114/80 (BP Location: Left Arm, Patient Position: Sitting, Cuff Size: Large)   Pulse 74   Temp 97.7 F (36.5 C)   Resp 20   Ht 4' 10.5 (1.486 m)   Wt 188 lb (85.3 kg)   SpO2 100%   BMI 38.62 kg/m

## 2024-04-28 NOTE — Progress Notes (Signed)
 Radiation Oncology         [phone] ________________________________  Outpatient Re-Consultation  Name: Laura Bush MRN: [id number]  Date: 04/29/2024  DOB: 11-Mar-1951  RR:Rneojwi, Harlene BROCKS, MD  Visgauss, Recardo Morrison, MD   REFERRING PHYSICIAN: Visgauss, Recardo Morrison, MD  DIAGNOSIS: There were no encounter diagnoses.  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   HISTORY OF PRESENT ILLNESS::Laura Bush is a 73 y.o. female who is accompanied by ***. she is returns to us  as a courtesy of Dr. Visgauss for an opinion concerning radiation therapy as part of management for her recent progression of osseous metastatic disease to the right femur from a NSCLC primary. To review, she was last seen here for a follow-up visit on 02/05/24 where she presented with a new symptomatic femur fracture, highly concerning for metastatic disease. Given that this was a new site, an additional course of radiation therapy was recommended to the right femur to prevent disease progression. She was promptly scheduled for CT simulation on 02/06/2024. Her prescribed palliative course was to consist of 8-10 fractions directed at the area of progression distal to her initial site in the right femur.   {Reason for not proceeding with XRT after CT SIM}  Since her last visit, she presented for a restaging PET scan on 02/22/24 which demonstrated: posttreatment changes in left lower lobe without evidence of locally recurrent non-small cell carcinoma. Imaging also showed interval resolution of the previously demonstrated RUL ground glass nodule, and stable metabolic activity in right femoral neck and around the intramedullary rod, with associated sclerotic osseous changes. However, a mild interval increase in FDG uptake  of the right femoral neck component was also demonstrated, located just proximal to the tip of the femoral rod, with differential considerations including a reactive vs malignant etiology noted. Imaging otherwise showed no evidence of lymphadenopathy or evidence to suggest locally recurrent breast malignancy.   She has continued to follow with Dr. Timmy since her last visit. She was noted to be doing well during a follow-up visit with Dr. Timmy on 02/27/24 other than ongoing pain to her right femoral area which had remained stable in severity. She was also planning on going on a trip out west shortly after this visit which prompted to Dr. Timmy to start her on Xarelto . She also received her dose of Xgeva  that day, and has continued to take Lorbrena  (100 mg po q day).    In more recent history, she presented to Dr. Huston at [facility] on 04/17/24 with increasing right leg pain over the past several months. Her pain had progressed to the point where she required a walker for ambulation, and Dr. Huston noted a significant functional decline in her overall status. Additionally, she was observed to have increasing swelling in her right leg concerning for DVT, (especially given her recent history of travel). A right LE vascular US  was accordingly performed that day which demonstrated no evidence of DVT in the right leg.   She also had an x-ray performed of the right femur at [facility] on 04/17/24 which showed new linear lucencies in the distal anterior femur, possibly representing a new pathologic periprosthetic fracture.  Dr. Huston has reviewed her recent scans (performed at [facility]) which show a new fracture and progressive disease to the right distal femur. She  has been subsequently referred back to us  for consideration of radiation therapy the right femur which we will discuss in detail today. (Her imaging performed at [facility] is not available for review at this time ~ NP Pam will be sending over  these results).    PREVIOUS RADIATION THERAPY: Yes   3) Treatment Intent: Curative  Radiation Treatment Dates: First Treatment Date: 2023-12-20 -- Last Treatment Date: 2024-01-03 Site/Dose/Technique/Mode:  Plan Name: Pelvis_R Site: Femur Right proximal Technique: Isodose Plan Mode: Photon Dose Per Fraction: 3 Gy Prescribed Dose (Delivered / Prescribed): 30 Gy / 30 Gy Prescribed Fxs (Delivered / Prescribed): 10 / 10   2) Radiation Treatment Dates: 02/16/2022 through 03/04/2022 Site Technique Total Dose (Gy) Dose per Fx (Gy) Completed Fx Beam Energies  Lung, Left: Lung_L IMRT 54/54 18 3/3 6XFFF  Femur Right: Ext_R 3D 24/24 3 8/8 10X  Femur Right: Ext_R_Bst IMRT 15/15 3 5/5 6X    1)  Diagnosis Stage IA TIb, N0, Left breast, IDC/DCIS; ER/PR+; Her2- diagnosed in 2016: s/p lumpectomy, XRT, antiestrogens  Dr. Dewey Diagnosis:   Left-sided breast cancer    Indication for treatment:  Curative      Radiation treatment dates:   12/30/2014 through 01/27/2015 Site/dose:   The patient initially received a dose of 42.5 Gy in 17 fractions to the breast using whole-breast tangent fields. This was delivered using a 3-D conformal technique. The patient then received a boost to the lumpectomy cavity. This delivered an additional 7.5 Gy in 3 fractions using a 3 field photon technique due to the depth of the lumpectomy cavity. The total dose was 50 Gy.  PAST MEDICAL HISTORY:  Past Medical History:  Diagnosis Date   Allergy    seasonal   Anemia associated with stage 3 chronic renal failure (HCC) 04/18/2023   Arthritis Last several years   Mostly in hands, wrists, shoulders, knees   Asthma    in cold weather   Breast cancer (HCC) 09/18/2014   left breast   Breast cancer (HCC) 10/07/2014   bx left breast   Breast cancer of upper-outer quadrant of left female breast (HCC) 09/22/2014   Cluster headaches    in her 40's   GERD (gastroesophageal reflux disease)    Goals of care,  counseling/discussion 01/14/2022   Heart murmur    as a child (has outgrown)   High cholesterol    History of radiation therapy    Left lung, Right Femur- 02/16/22-03/04/22- Dr. Lynwood Nasuti   History of radiation therapy    Right Pelvis-12/20/23-01/03/24-Dr. 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   Stroke [facility]) summer 2022   described as tiny   Swallowing difficulty    Venous insufficiency     PAST SURGICAL HISTORY: Past Surgical History:  Procedure Laterality Date   ABDOMINAL HYSTERECTOMY  1999   APPENDECTOMY  ~ 2006   BONE BIOPSY Right 01/04/2022   Procedure: RIGHT FEMORAL BONE  BIOPSY;  Surgeon: Celena Sharper, MD;  Location[facility] OR;  Service: Orthopedics;  Laterality: Right;   BONE EXCISION Right 04/11/2023   Procedure: BONE EXCISION;  Surgeon: Celena Sharper, MD;  Location: [facility] OR;  Service: Orthopedics;  Laterality: Right;   BREAST BIOPSY Left 10/2014   BREAST LUMPECTOMY Left 2016   BREAST REDUCTION SURGERY Bilateral 11/11/2014   Procedure: Bilateral Breast Reduction;  Surgeon: Alm Sick, MD;  Location: [facility] OR;  Service: Plastics;  Laterality: Bilateral;   BRONCHIAL BIOPSY  01/06/2022   Procedure: BRONCHIAL BIOPSIES;  Surgeon: Shelah Lamar RAMAN, MD;  Location: [facility] ENDOSCOPY;  Service: Pulmonary;;   BRONCHIAL BRUSHINGS  01/06/2022   Procedure: BRONCHIAL BRUSHINGS;  Surgeon: Shelah Lamar RAMAN, MD;  Location: [facility] ENDOSCOPY;  Service: Pulmonary;;   BRONCHIAL NEEDLE ASPIRATION BIOPSY  01/06/2022   Procedure: BRONCHIAL  NEEDLE ASPIRATION BIOPSIES;  Surgeon: Shelah Lamar RAMAN, MD;  Location[facility] ENDOSCOPY;  Service: Pulmonary;;   BRONCHIAL WASHINGS  01/06/2022   Procedure: BRONCHIAL WASHINGS;  Surgeon: Shelah Lamar RAMAN, MD;  Location: [facility] ENDOSCOPY;  Service: Pulmonary;;   CARPAL TUNNEL RELEASE Bilateral    2 surgeries on right, 1 on left   CESAREAN SECTION  1978; 1980   COLONOSCOPY     FEMUR IM NAIL Right 01/04/2022   Procedure: RIGHT FEMORAL INTRAMEDULLARY (IM) NAIL;  Surgeon: Celena Sharper, MD;  Location: [facility] OR;  Service: Orthopedics;  Laterality: Right;   FIDUCIAL MARKER PLACEMENT  01/06/2022   Procedure: FIDUCIAL MARKER PLACEMENT;  Surgeon: Shelah Lamar RAMAN, MD;  [facility] ENDOSCOPY;  Service: Pulmonary;;   FOREARM FRACTURE SURGERY Right 2003 X 3   FRACTURE SURGERY  2003, 2023   3 surgeries, right arm -03; Femur-23   HARDWARE REMOVAL Right 04/11/2023   Procedure: HARDWARE REMOVAL;  Surgeon: Celena Sharper, MD;  Location: [facility] OR;  Service: Orthopedics;  Laterality: Right;   KNEE ARTHROSCOPY Bilateral    meniscus repair   PARTIAL MASTECTOMY WITH NEEDLE LOCALIZATION AND AXILLARY SENTINEL LYMPH NODE BX Left 11/11/2014   Procedure: LEFT BREAST PARTIAL MASTECTOMY WITH NEEDLE LOCALIZATION TIMES TWO AND LEFT AXILLARY SENTINEL LYMPH NODE Biopsy;  Surgeon: Elon Pacini, MD;  Location: [facility] OR;  Service: General;  Laterality: Left;   REDUCTION MAMMAPLASTY Bilateral 2016   SQUAMOUS CELL CARCINOMA EXCISION  1980's X 1   nose   TONSILLECTOMY  ~ 1957   TUBAL LIGATION  ?1984    FAMILY HISTORY:  Family History  Problem Relation Age of Onset   Dementia Mother        Lives in [place]   Hypertension Mother    Hyperlipidemia Mother    Stroke Father        deceased   Heart failure Father 23   Hypertension Father    Uterine cancer Sister 73   Heart attack Maternal Grandmother    Hypertension Maternal Grandfather    Stroke Paternal Grandfather    Colon cancer Neg Hx    Esophageal cancer Neg Hx    Pancreatic  cancer Neg Hx    Stomach cancer Neg Hx    Liver disease Neg Hx    Sleep apnea Neg Hx     SOCIAL HISTORY:  Social History   Tobacco Use   Smoking status: Never   Smokeless tobacco: Never  Vaping Use   Vaping status: Never Used  Substance Use Topics   Alcohol use: Yes    Alcohol/week: 2.0 standard drinks of alcohol    Types: 2 Glasses of wine per week    Comment: 2  glasses a week   Drug use: No    ALLERGIES:  Allergies  Allergen Reactions   Diclofenac  Hypertension and Other (See Comments)    Other Reaction(s): Hypertension   Amlodipine  Swelling and Other (See Comments)    Limbs swell, not the throat   Lanolin Other (See Comments)    Sneezing and watery eyes. Allergic to Wool.  Other Reaction(s): Other (See Comments)  Sneezing and watery eyes. Allergic to Wool.    Sneezing and watery eyes. Allergic to Wool.   Silicone Dermatitis and Other (See Comments)    Redness (Bandaids also)   Tape Other (See Comments)    Redness (Bandaids also)    MEDICATIONS:  Current Outpatient Medications  Medication Sig Dispense Refill   acetaminophen  (TYLENOL ) 500 MG tablet Take 1 tablet (500 mg total) by mouth every 8 (eight) hours as needed for mild pain or moderate pain. 30 tablet 0   AIRSUPRA  90-80 MCG/ACT AERO INHALE 2 PUFFS BY MOUTH EVERY 6 HOURS AS NEEDED 10.7 g 1   aspirin EC 81 MG tablet Take 81 mg by mouth in the morning. Swallow whole.     B Complex-C (SUPER B COMPLEX PO) Take 1 tablet by mouth daily with breakfast.     Calcium  Carb-Cholecalciferol (CALCIUM  + D3 PO) Take 1 tablet by mouth in the morning.     cholecalciferol (VITAMIN D3) 25 MCG (1000 UNIT) tablet Take 1,000 Units by mouth in the morning.     COLLAGEN PO Take 3 tablets by mouth in the morning and at bedtime. With Biotin     Denosumab  (XGEVA  [place]) Inject into the skin every 3 (three) months.     Evolocumab  (REPATHA  SURECLICK) 140 MG/ML SOAJ Inject 140 mg into the skin every 14 (fourteen) days. 6 mL 3   fexofenadine  (ALLEGRA) 180 MG tablet Take 180 mg by mouth in the morning.     folic acid  (FOLATE) 400 MCG tablet Take 400 mcg by mouth in the morning.     furosemide  (LASIX ) 20 MG tablet TAKE 2 TABLETS BY MOUTH DAILY 180 tablet 3   ibuprofen (ADVIL) 200 MG tablet Take 600 mg by mouth daily as needed.     ipratropium (ATROVENT ) 0.06 % nasal spray Place 2 sprays into both nostrils 4 (four) times daily as needed for rhinitis. 15 mL 4   lorlatinib  (LORBRENA ) 100 MG tablet Take 1 tablet (100 mg total) by mouth daily. Swallow tablets whole. Do not chew, crush or split tablets. 30 tablet 0   metFORMIN  (GLUCOPHAGE ) 500 MG tablet Take 1 tablet by mouth daily 90 tablet 3   metolazone  (ZAROXOLYN ) 5 MG tablet Take 1 tablet (5 mg total) by mouth daily. Take Zaroxolyn  about 30-45 minutes prior to Lasix  each day. 30 tablet 4   NIFEdipine  (PROCARDIA  XL/NIFEDICAL XL) 60 MG 24 hr tablet TAKE 1 TABLET BY MOUTH DAILY 90 tablet 3   oxaprozin  (DAYPRO ) 600 MG tablet Take 1 tablet (600 mg total) by mouth daily. Please take in the morning with breakfast. 30 tablet 4   pantoprazole  (PROTONIX ) 40 MG tablet TAKE 1 TABLET BY MOUTH DAILY 90 tablet 0   potassium chloride  SA (KLOR-CON  M) 20 MEQ tablet TAKE 1 TABLET BY MOUTH 2 TIMES A DAY 60 tablet 5   spironolactone  (ALDACTONE ) 25 MG tablet Take 1 tablet (25 mg total) by mouth daily. 90 tablet 3   tirzepatide  (MOUNJARO ) 5 MG/0.5ML Pen Inject 5 mg into the skin once a week. 2 mL 0   traZODone  (DESYREL ) 50  MG tablet TAKE 1 TABLET BY MOUTH EVERY NIGHT AT BEDTIME AS NEEDED FOR SLEEP 90 tablet 2   valsartan  (DIOVAN ) 80 MG tablet TAKE 1 TABLET BY MOUTH DAILY 90 tablet 1   No current facility-administered medications for this encounter.    REVIEW OF SYSTEMS:  A 10+ POINT REVIEW OF SYSTEMS WAS OBTAINED including neurology, dermatology, psychiatry, cardiac, respiratory, lymph, extremities, GI, GU, musculoskeletal, constitutional, reproductive, HEENT. ***   PHYSICAL EXAM:  vitals were not taken for  this visit.   General: Alert and oriented, in no acute distress HEENT: Head is normocephalic. Extraocular movements are intact. Oropharynx is clear. Neck: Neck is supple, no palpable cervical or supraclavicular lymphadenopathy. Heart: Regular in rate and rhythm with no murmurs, rubs, or gallops. Chest: Clear to auscultation bilaterally, with no rhonchi, wheezes, or rales. Abdomen: Soft, nontender, nondistended, with no rigidity or guarding. Extremities: No cyanosis or edema. Lymphatics: see Neck Exam Skin: No concerning lesions. Musculoskeletal: symmetric strength and muscle tone throughout. Neurologic: Cranial nerves II through XII are grossly intact. No obvious focalities. Speech is fluent. Coordination is intact. Psychiatric: Judgment and insight are intact. Affect is appropriate. ***  ECOG = ***  0 - Asymptomatic (Fully active, able to carry on all predisease activities without restriction)  1 - Symptomatic but completely ambulatory (Restricted in physically strenuous activity but ambulatory and able to carry out work of a light or sedentary nature. For example, light housework, office work)  2 - Symptomatic, <50% in bed during the day (Ambulatory and capable of all self care but unable to carry out any work activities. Up and about more than 50% of waking hours)  3 - Symptomatic, >50% in bed, but not bedbound (Capable of only limited self-care, confined to bed or chair 50% or more of waking hours)  4 - Bedbound (Completely disabled. Cannot carry on any self-care. Totally confined to bed or chair)  5 - Death   Raylene MM, Creech RH, Tormey DC, et al. [phone]). Toxicity and response criteria of the [facility]. Am. DOROTHA Bridges. Oncol. 5 (6): 649-55  LABORATORY DATA:  Lab Results  Component Value Date   WBC 6.6 04/23/2024   HGB 11.2 (L) 04/23/2024   HCT 32.9 (L) 04/23/2024   MCV 99.7 04/23/2024   PLT 207 04/23/2024   NEUTROABS 4.1 04/23/2024   Lab Results   Component Value Date   NA 138 04/23/2024   K 3.2 (L) 04/23/2024   CL 97 (L) 04/23/2024   CO2 27 04/23/2024   GLUCOSE 100 (H) 04/23/2024   BUN 30 (H) 04/23/2024   CREATININE 1.12 (H) 04/23/2024   CALCIUM  10.3 04/23/2024      RADIOGRAPHY: No results found.    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   ***  Today, I talked to the patient and family about the findings and work-up thus far.  We discussed the natural history of *** and general treatment, highlighting the role of radiotherapy in the management.  We discussed the available radiation techniques, and focused on the details of logistics and delivery.  We reviewed the anticipated acute and late sequelae associated with radiation in this setting.  The patient was encouraged to ask questions that I answered to the best of my ability. *** A patient consent form was discussed and signed.  We  retained a copy for our records.  The patient would like to proceed with radiation and will be scheduled for CT simulation.  PLAN: ***    *** minutes of total time was spent for this patient encounter, including preparation, face-to-face counseling with the patient and coordination of care, physical exam, and documentation of the encounter.   ------------------------------------------------  Lynwood CHARM Nasuti, PhD, MD  This document serves as a record of services personally performed by Lynwood Nasuti, MD. It was created on his behalf by Dorthy Fuse, a trained medical scribe. The creation of this record is based on the scribe's personal observations and the provider's statements to them. This document has been checked and approved by the attending provider.

## 2024-04-29 ENCOUNTER — Encounter: Payer: Self-pay | Admitting: Radiation Oncology

## 2024-04-29 ENCOUNTER — Ambulatory Visit
Admission: RE | Admit: 2024-04-29 | Discharge: 2024-04-29 | Disposition: A | Discharge status: Home / Self Care | Source: Ambulatory Visit | Attending: Radiation Oncology | Admitting: Radiation Oncology

## 2024-04-29 VITALS — BP 114/80 | HR 74 | Temp 97.7°F | Resp 20 | Ht 58.5 in | Wt 188.0 lb

## 2024-04-29 DIAGNOSIS — C50212 Malignant neoplasm of upper-inner quadrant of left female breast: Secondary | ICD-10-CM | POA: Diagnosis not present

## 2024-04-29 DIAGNOSIS — Z79899 Other long term (current) drug therapy: Secondary | ICD-10-CM | POA: Insufficient documentation

## 2024-04-29 DIAGNOSIS — D631 Anemia in chronic kidney disease: Secondary | ICD-10-CM | POA: Diagnosis not present

## 2024-04-29 DIAGNOSIS — E7800 Pure hypercholesterolemia, unspecified: Secondary | ICD-10-CM | POA: Diagnosis not present

## 2024-04-29 DIAGNOSIS — C3490 Malignant neoplasm of unspecified part of unspecified bronchus or lung: Secondary | ICD-10-CM

## 2024-04-29 DIAGNOSIS — C7951 Secondary malignant neoplasm of bone: Secondary | ICD-10-CM | POA: Diagnosis not present

## 2024-04-29 DIAGNOSIS — Z7984 Long term (current) use of oral hypoglycemic drugs: Secondary | ICD-10-CM | POA: Diagnosis not present

## 2024-04-29 DIAGNOSIS — N183 Chronic kidney disease, stage 3 unspecified: Secondary | ICD-10-CM | POA: Diagnosis not present

## 2024-04-29 DIAGNOSIS — I872 Venous insufficiency (chronic) (peripheral): Secondary | ICD-10-CM | POA: Insufficient documentation

## 2024-04-29 DIAGNOSIS — I129 Hypertensive chronic kidney disease with stage 1 through stage 4 chronic kidney disease, or unspecified chronic kidney disease: Secondary | ICD-10-CM | POA: Insufficient documentation

## 2024-04-29 DIAGNOSIS — K219 Gastro-esophageal reflux disease without esophagitis: Secondary | ICD-10-CM | POA: Diagnosis not present

## 2024-04-29 DIAGNOSIS — M129 Arthropathy, unspecified: Secondary | ICD-10-CM | POA: Diagnosis not present

## 2024-04-29 DIAGNOSIS — C3432 Malignant neoplasm of lower lobe, left bronchus or lung: Secondary | ICD-10-CM | POA: Insufficient documentation

## 2024-04-29 DIAGNOSIS — R011 Cardiac murmur, unspecified: Secondary | ICD-10-CM | POA: Insufficient documentation

## 2024-04-29 DIAGNOSIS — Z8673 Personal history of transient ischemic attack (TIA), and cerebral infarction without residual deficits: Secondary | ICD-10-CM | POA: Diagnosis not present

## 2024-04-29 DIAGNOSIS — Z923 Personal history of irradiation: Secondary | ICD-10-CM | POA: Insufficient documentation

## 2024-04-30 ENCOUNTER — Encounter: Payer: Self-pay | Admitting: Hematology & Oncology

## 2024-04-30 DIAGNOSIS — C7951 Secondary malignant neoplasm of bone: Secondary | ICD-10-CM | POA: Diagnosis not present

## 2024-04-30 DIAGNOSIS — C3432 Malignant neoplasm of lower lobe, left bronchus or lung: Secondary | ICD-10-CM | POA: Diagnosis not present

## 2024-05-01 ENCOUNTER — Ambulatory Visit
Admission: RE | Admit: 2024-05-01 | Discharge: 2024-05-01 | Disposition: A | Discharge status: Home / Self Care | Source: Ambulatory Visit | Attending: Radiation Oncology | Admitting: Radiation Oncology

## 2024-05-01 ENCOUNTER — Encounter: Payer: Self-pay | Admitting: Hematology & Oncology

## 2024-05-01 DIAGNOSIS — C3432 Malignant neoplasm of lower lobe, left bronchus or lung: Secondary | ICD-10-CM | POA: Diagnosis not present

## 2024-05-01 DIAGNOSIS — C7951 Secondary malignant neoplasm of bone: Secondary | ICD-10-CM | POA: Diagnosis not present

## 2024-05-01 DIAGNOSIS — C3490 Malignant neoplasm of unspecified part of unspecified bronchus or lung: Secondary | ICD-10-CM | POA: Diagnosis not present

## 2024-05-02 ENCOUNTER — Ambulatory Visit
Admission: RE | Admit: 2024-05-02 | Discharge: 2024-05-02 | Discharge status: Home / Self Care | Source: Ambulatory Visit | Attending: Radiation Oncology | Admitting: Radiation Oncology

## 2024-05-02 ENCOUNTER — Other Ambulatory Visit: Payer: Self-pay

## 2024-05-02 DIAGNOSIS — C3490 Malignant neoplasm of unspecified part of unspecified bronchus or lung: Secondary | ICD-10-CM | POA: Diagnosis not present

## 2024-05-02 DIAGNOSIS — C7951 Secondary malignant neoplasm of bone: Secondary | ICD-10-CM

## 2024-05-02 DIAGNOSIS — C3432 Malignant neoplasm of lower lobe, left bronchus or lung: Secondary | ICD-10-CM | POA: Diagnosis not present

## 2024-05-02 LAB — RAD ONC ARIA SESSION SUMMARY
Course Elapsed Days: 0
Plan Fractions Treated to Date: 1
Plan Prescribed Dose Per Fraction: 3 Gy
Plan Total Fractions Prescribed: 5
Plan Total Prescribed Dose: 15 Gy
Reference Point Dosage Given to Date: 3 Gy
Reference Point Session Dosage Given: 3 Gy
Session Number: 1

## 2024-05-03 ENCOUNTER — Other Ambulatory Visit: Payer: Self-pay

## 2024-05-03 ENCOUNTER — Ambulatory Visit
Admission: RE | Admit: 2024-05-03 | Discharge: 2024-05-03 | Disposition: A | Discharge status: Home / Self Care | Source: Ambulatory Visit | Attending: Radiation Oncology | Admitting: Radiation Oncology

## 2024-05-03 DIAGNOSIS — C3490 Malignant neoplasm of unspecified part of unspecified bronchus or lung: Secondary | ICD-10-CM | POA: Diagnosis not present

## 2024-05-03 LAB — RAD ONC ARIA SESSION SUMMARY
Course Elapsed Days: 1
Plan Fractions Treated to Date: 2
Plan Prescribed Dose Per Fraction: 3 Gy
Plan Total Fractions Prescribed: 5
Plan Total Prescribed Dose: 15 Gy
Reference Point Dosage Given to Date: 6 Gy
Reference Point Session Dosage Given: 3 Gy
Session Number: 2

## 2024-05-06 ENCOUNTER — Other Ambulatory Visit: Payer: Self-pay

## 2024-05-06 ENCOUNTER — Ambulatory Visit
Admission: RE | Admit: 2024-05-06 | Discharge: 2024-05-06 | Disposition: A | Discharge status: Home / Self Care | Source: Ambulatory Visit | Attending: Radiation Oncology | Admitting: Radiation Oncology

## 2024-05-06 DIAGNOSIS — C3490 Malignant neoplasm of unspecified part of unspecified bronchus or lung: Secondary | ICD-10-CM | POA: Diagnosis not present

## 2024-05-06 LAB — RAD ONC ARIA SESSION SUMMARY
Course Elapsed Days: 4
Plan Fractions Treated to Date: 3
Plan Prescribed Dose Per Fraction: 3 Gy
Plan Total Fractions Prescribed: 5
Plan Total Prescribed Dose: 15 Gy
Reference Point Dosage Given to Date: 9 Gy
Reference Point Session Dosage Given: 3 Gy
Session Number: 3

## 2024-05-07 ENCOUNTER — Ambulatory Visit: Admission: RE | Admit: 2024-05-07 | Source: Ambulatory Visit

## 2024-05-07 ENCOUNTER — Ambulatory Visit
Admission: RE | Admit: 2024-05-07 | Discharge: 2024-05-07 | Disposition: A | Discharge status: Home / Self Care | Source: Ambulatory Visit | Attending: Radiation Oncology | Admitting: Radiation Oncology

## 2024-05-08 ENCOUNTER — Ambulatory Visit
Admission: RE | Admit: 2024-05-08 | Discharge: 2024-05-08 | Disposition: A | Discharge status: Home / Self Care | Source: Ambulatory Visit | Attending: Radiation Oncology | Admitting: Radiation Oncology

## 2024-05-08 ENCOUNTER — Other Ambulatory Visit: Payer: Self-pay

## 2024-05-08 DIAGNOSIS — C7951 Secondary malignant neoplasm of bone: Secondary | ICD-10-CM | POA: Diagnosis not present

## 2024-05-08 DIAGNOSIS — C3490 Malignant neoplasm of unspecified part of unspecified bronchus or lung: Secondary | ICD-10-CM | POA: Insufficient documentation

## 2024-05-08 DIAGNOSIS — C3432 Malignant neoplasm of lower lobe, left bronchus or lung: Secondary | ICD-10-CM | POA: Diagnosis not present

## 2024-05-08 DIAGNOSIS — Z510 Encounter for antineoplastic radiation therapy: Secondary | ICD-10-CM | POA: Diagnosis not present

## 2024-05-08 LAB — RAD ONC ARIA SESSION SUMMARY
Course Elapsed Days: 6
Plan Fractions Treated to Date: 4
Plan Prescribed Dose Per Fraction: 3 Gy
Plan Total Fractions Prescribed: 5
Plan Total Prescribed Dose: 15 Gy
Reference Point Dosage Given to Date: 12 Gy
Reference Point Session Dosage Given: 3 Gy
Session Number: 4

## 2024-05-09 ENCOUNTER — Other Ambulatory Visit: Payer: Self-pay

## 2024-05-09 ENCOUNTER — Ambulatory Visit: Admitting: Radiation Oncology

## 2024-05-09 ENCOUNTER — Ambulatory Visit
Admission: RE | Admit: 2024-05-09 | Discharge: 2024-05-09 | Disposition: A | Source: Ambulatory Visit | Attending: Radiation Oncology | Admitting: Radiation Oncology

## 2024-05-09 DIAGNOSIS — C7951 Secondary malignant neoplasm of bone: Secondary | ICD-10-CM | POA: Diagnosis not present

## 2024-05-09 DIAGNOSIS — C3490 Malignant neoplasm of unspecified part of unspecified bronchus or lung: Secondary | ICD-10-CM | POA: Diagnosis not present

## 2024-05-09 DIAGNOSIS — Z510 Encounter for antineoplastic radiation therapy: Secondary | ICD-10-CM | POA: Diagnosis not present

## 2024-05-09 DIAGNOSIS — C3432 Malignant neoplasm of lower lobe, left bronchus or lung: Secondary | ICD-10-CM | POA: Diagnosis not present

## 2024-05-09 LAB — RAD ONC ARIA SESSION SUMMARY
Course Elapsed Days: 7
Plan Fractions Treated to Date: 5
Plan Prescribed Dose Per Fraction: 3 Gy
Plan Total Fractions Prescribed: 5
Plan Total Prescribed Dose: 15 Gy
Reference Point Dosage Given to Date: 15 Gy
Reference Point Session Dosage Given: 3 Gy
Session Number: 5

## 2024-05-10 ENCOUNTER — Ambulatory Visit

## 2024-05-10 ENCOUNTER — Ambulatory Visit
Admission: RE | Admit: 2024-05-10 | Discharge: 2024-05-10 | Disposition: A | Discharge status: Home / Self Care | Source: Ambulatory Visit | Attending: Radiation Oncology | Admitting: Radiation Oncology

## 2024-05-10 ENCOUNTER — Other Ambulatory Visit: Payer: Self-pay

## 2024-05-10 DIAGNOSIS — Z510 Encounter for antineoplastic radiation therapy: Secondary | ICD-10-CM | POA: Diagnosis not present

## 2024-05-10 DIAGNOSIS — C3490 Malignant neoplasm of unspecified part of unspecified bronchus or lung: Secondary | ICD-10-CM | POA: Diagnosis not present

## 2024-05-10 DIAGNOSIS — C3432 Malignant neoplasm of lower lobe, left bronchus or lung: Secondary | ICD-10-CM | POA: Diagnosis not present

## 2024-05-10 DIAGNOSIS — C7951 Secondary malignant neoplasm of bone: Secondary | ICD-10-CM | POA: Diagnosis not present

## 2024-05-10 LAB — RAD ONC ARIA SESSION SUMMARY
Course Elapsed Days: 8
Plan Fractions Treated to Date: 1
Plan Prescribed Dose Per Fraction: 3 Gy
Plan Total Fractions Prescribed: 5
Plan Total Prescribed Dose: 15 Gy
Reference Point Dosage Given to Date: 3 Gy
Reference Point Session Dosage Given: 3 Gy
Session Number: 6

## 2024-05-13 ENCOUNTER — Other Ambulatory Visit: Payer: Self-pay

## 2024-05-13 ENCOUNTER — Ambulatory Visit
Admission: RE | Admit: 2024-05-13 | Discharge: 2024-05-13 | Disposition: A | Discharge status: Home / Self Care | Source: Ambulatory Visit | Attending: Radiation Oncology | Admitting: Radiation Oncology

## 2024-05-13 ENCOUNTER — Ambulatory Visit

## 2024-05-13 ENCOUNTER — Other Ambulatory Visit: Payer: Self-pay | Admitting: Family Medicine

## 2024-05-13 DIAGNOSIS — C3490 Malignant neoplasm of unspecified part of unspecified bronchus or lung: Secondary | ICD-10-CM | POA: Diagnosis not present

## 2024-05-13 DIAGNOSIS — R7303 Prediabetes: Secondary | ICD-10-CM

## 2024-05-13 LAB — RAD ONC ARIA SESSION SUMMARY
Course Elapsed Days: 11
Plan Fractions Treated to Date: 2
Plan Prescribed Dose Per Fraction: 3 Gy
Plan Total Fractions Prescribed: 5
Plan Total Prescribed Dose: 15 Gy
Reference Point Dosage Given to Date: 6 Gy
Reference Point Session Dosage Given: 3 Gy
Session Number: 7

## 2024-05-14 ENCOUNTER — Other Ambulatory Visit: Payer: Self-pay

## 2024-05-14 ENCOUNTER — Ambulatory Visit
Admission: RE | Admit: 2024-05-14 | Discharge: 2024-05-14 | Disposition: A | Discharge status: Home / Self Care | Source: Ambulatory Visit | Attending: Radiation Oncology | Admitting: Radiation Oncology

## 2024-05-14 DIAGNOSIS — C3490 Malignant neoplasm of unspecified part of unspecified bronchus or lung: Secondary | ICD-10-CM | POA: Diagnosis not present

## 2024-05-14 LAB — RAD ONC ARIA SESSION SUMMARY
Course Elapsed Days: 12
Plan Fractions Treated to Date: 3
Plan Prescribed Dose Per Fraction: 3 Gy
Plan Total Fractions Prescribed: 5
Plan Total Prescribed Dose: 15 Gy
Reference Point Dosage Given to Date: 9 Gy
Reference Point Session Dosage Given: 3 Gy
Session Number: 8

## 2024-05-15 ENCOUNTER — Ambulatory Visit
Admission: RE | Admit: 2024-05-15 | Discharge: 2024-05-15 | Disposition: A | Discharge status: Home / Self Care | Source: Ambulatory Visit | Attending: Radiation Oncology | Admitting: Radiation Oncology

## 2024-05-15 ENCOUNTER — Other Ambulatory Visit: Payer: Self-pay

## 2024-05-15 ENCOUNTER — Ambulatory Visit

## 2024-05-15 DIAGNOSIS — C3490 Malignant neoplasm of unspecified part of unspecified bronchus or lung: Secondary | ICD-10-CM | POA: Diagnosis not present

## 2024-05-15 LAB — RAD ONC ARIA SESSION SUMMARY
Course Elapsed Days: 13
Plan Fractions Treated to Date: 4
Plan Prescribed Dose Per Fraction: 3 Gy
Plan Total Fractions Prescribed: 5
Plan Total Prescribed Dose: 15 Gy
Reference Point Dosage Given to Date: 12 Gy
Reference Point Session Dosage Given: 3 Gy
Session Number: 9

## 2024-05-16 ENCOUNTER — Ambulatory Visit

## 2024-05-17 ENCOUNTER — Other Ambulatory Visit: Payer: Self-pay

## 2024-05-17 ENCOUNTER — Ambulatory Visit
Admission: RE | Admit: 2024-05-17 | Discharge: 2024-05-17 | Disposition: A | Discharge status: Home / Self Care | Source: Ambulatory Visit | Attending: Radiation Oncology | Admitting: Radiation Oncology

## 2024-05-17 DIAGNOSIS — C3490 Malignant neoplasm of unspecified part of unspecified bronchus or lung: Secondary | ICD-10-CM | POA: Diagnosis not present

## 2024-05-17 DIAGNOSIS — C7951 Secondary malignant neoplasm of bone: Secondary | ICD-10-CM | POA: Diagnosis not present

## 2024-05-17 DIAGNOSIS — Z510 Encounter for antineoplastic radiation therapy: Secondary | ICD-10-CM | POA: Diagnosis not present

## 2024-05-17 DIAGNOSIS — C3432 Malignant neoplasm of lower lobe, left bronchus or lung: Secondary | ICD-10-CM | POA: Diagnosis not present

## 2024-05-17 LAB — RAD ONC ARIA SESSION SUMMARY
Course Elapsed Days: 15
Plan Fractions Treated to Date: 5
Plan Prescribed Dose Per Fraction: 3 Gy
Plan Total Fractions Prescribed: 5
Plan Total Prescribed Dose: 15 Gy
Reference Point Dosage Given to Date: 15 Gy
Reference Point Session Dosage Given: 3 Gy
Session Number: 10

## 2024-05-23 NOTE — Radiation Completion Notes (Addendum)
  Radiation Oncology         [phone]) [phone] ________________________________  Name: Laura Bush MRN: [id number]  Date of Service: 05/17/2024  DOB: [birth year]  End of Treatment Note  Diagnosis: Right femur metastasis from Stage IV adenocarcinoma, NSCLC, of the LLL Intent: Palliative     ==========DELIVERED PLANS==========  First Treatment Date: 2024-05-02 Last Treatment Date: 2024-05-17   Plan Name: Ext_R Site: Femur Right Technique: Isodose Plan Mode: Photon Dose Per Fraction: 3 Gy Prescribed Dose (Delivered / Prescribed): 15 Gy / 15 Gy Prescribed Fxs (Delivered / Prescribed): 5 / 5   Plan Name: Ext_R_Bst Site: Femur Right Technique: 3D Mode: Photon Dose Per Fraction: 3 Gy Prescribed Dose (Delivered / Prescribed): 15 Gy / 15 Gy Prescribed Fxs (Delivered / Prescribed): 5 / 5     ====================================  The patient tolerated radiation well overall. She developed some discomfort along the medial knee and episodes of muscle cramping along the thigh area.   The patient will return in one month and will continue follow up with Dr. Timmy as well.      Ronita Due, PA-C

## 2024-05-24 ENCOUNTER — Other Ambulatory Visit: Payer: Self-pay | Admitting: Hematology & Oncology

## 2024-05-24 ENCOUNTER — Other Ambulatory Visit: Payer: Self-pay

## 2024-05-24 ENCOUNTER — Encounter (INDEPENDENT_AMBULATORY_CARE_PROVIDER_SITE_OTHER): Payer: Self-pay

## 2024-05-24 DIAGNOSIS — C7951 Secondary malignant neoplasm of bone: Secondary | ICD-10-CM

## 2024-05-25 MED ORDER — LORLATINIB 100 MG PO TABS
100.0000 mg | ORAL_TABLET | Freq: Every day | ORAL | 0 refills | Status: DC
  Filled 2024-05-27 (×2): qty 30, 30d supply, fill #0

## 2024-05-27 ENCOUNTER — Other Ambulatory Visit: Payer: Self-pay

## 2024-05-27 ENCOUNTER — Other Ambulatory Visit: Payer: Self-pay | Admitting: Cardiovascular Disease

## 2024-05-27 ENCOUNTER — Encounter (HOSPITAL_COMMUNITY)

## 2024-05-27 DIAGNOSIS — I25118 Atherosclerotic heart disease of native coronary artery with other forms of angina pectoris: Secondary | ICD-10-CM

## 2024-05-27 DIAGNOSIS — E785 Hyperlipidemia, unspecified: Secondary | ICD-10-CM

## 2024-05-27 MED ORDER — REPATHA SURECLICK 140 MG/ML ~~LOC~~ SOAJ
140.0000 mg | SUBCUTANEOUS | 3 refills | Status: AC
  Filled 2024-05-27: qty 6, 84d supply, fill #0
  Filled 2024-09-01 – 2024-09-04 (×2): qty 6, 84d supply, fill #1

## 2024-05-27 NOTE — Progress Notes (Signed)
 Specialty Pharmacy Refill Coordination Note  Laura Bush is a 73 y.o. female contacted today regarding refills of specialty medication(s) Lorlatinib  (LORBRENA )   Patient requested Delivery   Delivery date: 06/04/24   Verified address: [address], [place], [place] [address]   Medication will be filled on 10.27.25.

## 2024-06-01 ENCOUNTER — Encounter: Payer: Self-pay | Admitting: Family Medicine

## 2024-06-01 ENCOUNTER — Other Ambulatory Visit: Payer: Self-pay | Admitting: Hematology & Oncology

## 2024-06-03 ENCOUNTER — Encounter: Payer: Self-pay | Admitting: Hematology & Oncology

## 2024-06-03 ENCOUNTER — Other Ambulatory Visit: Payer: Self-pay

## 2024-06-05 ENCOUNTER — Inpatient Hospital Stay (HOSPITAL_BASED_OUTPATIENT_CLINIC_OR_DEPARTMENT_OTHER): Admitting: Hematology & Oncology

## 2024-06-05 ENCOUNTER — Inpatient Hospital Stay: Attending: Hematology & Oncology

## 2024-06-05 ENCOUNTER — Ambulatory Visit: Payer: Self-pay | Admitting: Hematology & Oncology

## 2024-06-05 ENCOUNTER — Inpatient Hospital Stay

## 2024-06-05 ENCOUNTER — Other Ambulatory Visit (HOSPITAL_BASED_OUTPATIENT_CLINIC_OR_DEPARTMENT_OTHER): Payer: Self-pay

## 2024-06-05 ENCOUNTER — Encounter: Payer: Self-pay | Admitting: Hematology & Oncology

## 2024-06-05 VITALS — BP 125/49 | HR 73 | Temp 98.2°F | Resp 20 | Ht 58.5 in | Wt 183.1 lb

## 2024-06-05 DIAGNOSIS — C3432 Malignant neoplasm of lower lobe, left bronchus or lung: Secondary | ICD-10-CM | POA: Diagnosis not present

## 2024-06-05 DIAGNOSIS — C3490 Malignant neoplasm of unspecified part of unspecified bronchus or lung: Secondary | ICD-10-CM | POA: Diagnosis not present

## 2024-06-05 DIAGNOSIS — C7951 Secondary malignant neoplasm of bone: Secondary | ICD-10-CM | POA: Insufficient documentation

## 2024-06-05 LAB — CBC WITH DIFFERENTIAL (CANCER CENTER ONLY)
Abs Immature Granulocytes: 0.09 K/uL — ABNORMAL HIGH (ref 0.00–0.07)
Basophils Absolute: 0 K/uL (ref 0.0–0.1)
Basophils Relative: 1 %
Eosinophils Absolute: 0.4 K/uL (ref 0.0–0.5)
Eosinophils Relative: 7 %
HCT: 34.2 % — ABNORMAL LOW (ref 36.0–46.0)
Hemoglobin: 11.5 g/dL — ABNORMAL LOW (ref 12.0–15.0)
Immature Granulocytes: 2 %
Lymphocytes Relative: 17 %
Lymphs Abs: 0.9 K/uL (ref 0.7–4.0)
MCH: 34 pg (ref 26.0–34.0)
MCHC: 33.6 g/dL (ref 30.0–36.0)
MCV: 101.2 fL — ABNORMAL HIGH (ref 80.0–100.0)
Monocytes Absolute: 0.6 K/uL (ref 0.1–1.0)
Monocytes Relative: 11 %
Neutro Abs: 3.3 K/uL (ref 1.7–7.7)
Neutrophils Relative %: 62 %
Platelet Count: 214 K/uL (ref 150–400)
RBC: 3.38 MIL/uL — ABNORMAL LOW (ref 3.87–5.11)
RDW: 13.4 % (ref 11.5–15.5)
WBC Count: 5.3 K/uL (ref 4.0–10.5)
nRBC: 0 % (ref 0.0–0.2)

## 2024-06-05 LAB — IRON AND IRON BINDING CAPACITY (CC-WL,HP ONLY)
Iron: 75 ug/dL (ref 28–170)
Saturation Ratios: 28 % (ref 10.4–31.8)
TIBC: 269 ug/dL (ref 250–450)
UIBC: 194 ug/dL

## 2024-06-05 LAB — CMP (CANCER CENTER ONLY)
ALT: 21 U/L (ref 0–44)
AST: 21 U/L (ref 15–41)
Albumin: 4.3 g/dL (ref 3.5–5.0)
Alkaline Phosphatase: 44 U/L (ref 38–126)
Anion gap: 14 (ref 5–15)
BUN: 35 mg/dL — ABNORMAL HIGH (ref 8–23)
CO2: 28 mmol/L (ref 22–32)
Calcium: 11 mg/dL — ABNORMAL HIGH (ref 8.9–10.3)
Chloride: 99 mmol/L (ref 98–111)
Creatinine: 1.14 mg/dL — ABNORMAL HIGH (ref 0.44–1.00)
GFR, Estimated: 51 mL/min — ABNORMAL LOW (ref >60)
Glucose, Bld: 106 mg/dL — ABNORMAL HIGH (ref 70–99)
Potassium: 3.5 mmol/L (ref 3.5–5.1)
Sodium: 141 mmol/L (ref 135–145)
Total Bilirubin: 0.2 mg/dL (ref 0.0–1.2)
Total Protein: 7.6 g/dL (ref 6.5–8.1)

## 2024-06-05 LAB — LACTATE DEHYDROGENASE: LDH: 170 U/L (ref 98–192)

## 2024-06-05 LAB — FERRITIN: Ferritin: 415 ng/mL — ABNORMAL HIGH (ref 11–307)

## 2024-06-05 MED ORDER — FLUZONE HIGH-DOSE 0.5 ML IM SUSY
0.5000 mL | PREFILLED_SYRINGE | Freq: Once | INTRAMUSCULAR | 0 refills | Status: AC
  Filled 2024-06-05: qty 0.5, 1d supply, fill #0

## 2024-06-05 MED ORDER — DENOSUMAB 120 MG/1.7ML ~~LOC~~ SOLN
120.0000 mg | Freq: Once | SUBCUTANEOUS | Status: AC
  Administered 2024-06-05: 120 mg via SUBCUTANEOUS
  Filled 2024-06-05: qty 1.7

## 2024-06-05 NOTE — Progress Notes (Signed)
 Hematology and Oncology Follow Up Visit  Laura Bush [id number] [birth year] 73 y.o. 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 3 months  -next dose in 08/2024    S/P SBRT to LUL -- completed on 03/04/2022 S/P XRT to RIGHT femur -- completed on 03/04/2022 Alecensa  600 mg po BID -- start on 03/24/2022 --on hold starting 04/04/2023 -restart 300 mg p.o. twice daily on 05/04/2023 --DC on 05/26/2023 Lorbrena  100 mg po q day  -- start on 06/14/2023 Aranesp  300 mcg subcu every 3 weeks Venofer -given on 07/10/2023     Interim History:  Laura Bush is back for follow-up.  She actually looks quite good.  She feels okay.  She finished her radiotherapy about 3 weeks or so ago.  She is due for a PET scan and late November.  She is going to see Orthopedic Surgery for her right knee.  I think this is some totally separate from her malignancy.  She is really not having a lot of pain in the right hip.  She is on her diuretics.  She is on Mounjaro .  She has lost a little bit of weight..  She has had no cough or shortness of breath.  She has had no nausea or vomiting.  There is been no change in bowel or bladder habits..  She is on Lorbrena .  She she is doing quite well with this.  However, it sounds like the assistance program that they have been using might not be able to pay for this next year.  She has had no fever.  She has had no bleeding.    When her last saw her, her ferritin was 435 with an iron  saturation of 25%.  Currently, I would say that her performance status is ECOG 1.  Medications:   Current Outpatient Medications:    acetaminophen  (TYLENOL ) 500 MG tablet, Take 1 tablet (500 mg total) by mouth every 8 (eight) hours as needed for mild pain or moderate  pain., Disp: 30 tablet, Rfl: 0   AIRSUPRA  90-80 MCG/ACT AERO, INHALE 2 PUFFS BY MOUTH EVERY 6 HOURS AS NEEDED, Disp: 10.7 g, Rfl: 1   aspirin EC 81 MG tablet, Take 81 mg by mouth in the morning. Swallow whole., Disp: , Rfl:    B Complex-C (SUPER B COMPLEX PO), Take 1 tablet by mouth daily with breakfast., Disp: , Rfl:    Calcium  Carb-Cholecalciferol (CALCIUM  + D3 PO), Take 1 tablet by mouth in the morning., Disp: , Rfl:    cholecalciferol (VITAMIN D3) 25 MCG (1000 UNIT) tablet, Take 1,000 Units by mouth in the morning., Disp: , Rfl:    COLLAGEN PO, Take 3 tablets by mouth in the morning and at bedtime. With Biotin (Patient taking differently: Take 2 tablets by mouth daily. With Biotin), Disp: , Rfl:    Denosumab  (XGEVA  [place]), Inject into the skin every 3 (three) months., Disp: , Rfl:    Evolocumab  (REPATHA  SURECLICK) 140 MG/ML SOAJ, Inject 140 mg into the skin every 14 (fourteen) days., Disp: 6 mL, Rfl: 3   fexofenadine (ALLEGRA) 180 MG tablet, Take 180 mg by mouth in the morning., Disp: , Rfl:    folic acid  (FOLATE) 400 MCG tablet, Take 400 mcg by mouth in the morning., Disp: , Rfl:    furosemide  (  LASIX ) 20 MG tablet, TAKE 2 TABLETS BY MOUTH DAILY, Disp: 180 tablet, Rfl: 3   ibuprofen (ADVIL) 200 MG tablet, Take 600 mg by mouth daily as needed., Disp: , Rfl:    ipratropium (ATROVENT ) 0.06 % nasal spray, Place 2 sprays into both nostrils 4 (four) times daily as needed for rhinitis., Disp: 15 mL, Rfl: 4   lorlatinib  (LORBRENA ) 100 MG tablet, Take 1 tablet (100 mg total) by mouth daily. Swallow tablets whole. Do not chew, crush or split tablets., Disp: 30 tablet, Rfl: 0   metFORMIN  (GLUCOPHAGE ) 500 MG tablet, Take 1 tablet by mouth daily, Disp: 90 tablet, Rfl: 3   metolazone  (ZAROXOLYN ) 5 MG tablet, TAKE 1 TABLET BY MOUTH DAILY TAKE ABOUT 30-45 MINUTES PRIOR TO LASIX  EACH DAY, Disp: 30 tablet, Rfl: 4   NIFEdipine  (PROCARDIA  XL/NIFEDICAL XL) 60 MG 24 hr tablet, TAKE 1 TABLET BY MOUTH DAILY, Disp: 90  tablet, Rfl: 3   oxaprozin  (DAYPRO ) 600 MG tablet, Take 1 tablet (600 mg total) by mouth daily. Please take in the morning with breakfast., Disp: 30 tablet, Rfl: 4   pantoprazole  (PROTONIX ) 40 MG tablet, TAKE 1 TABLET BY MOUTH DAILY, Disp: 90 tablet, Rfl: 0   potassium chloride  SA (KLOR-CON  M) 20 MEQ tablet, TAKE 1 TABLET BY MOUTH 2 TIMES A DAY, Disp: 60 tablet, Rfl: 5   spironolactone  (ALDACTONE ) 25 MG tablet, Take 1 tablet (25 mg total) by mouth daily., Disp: 90 tablet, Rfl: 3   tirzepatide  (MOUNJARO ) 5 MG/0.5ML Pen, Inject 5 mg into the skin once a week., Disp: 2 mL, Rfl: 1   traZODone  (DESYREL ) 50 MG tablet, TAKE 1 TABLET BY MOUTH EVERY NIGHT AT BEDTIME AS NEEDED FOR SLEEP, Disp: 90 tablet, Rfl: 2   valsartan  (DIOVAN ) 80 MG tablet, TAKE 1 TABLET BY MOUTH DAILY, Disp: 90 tablet, Rfl: 1  Allergies:  Allergies  Allergen Reactions   Diclofenac  Hypertension and Other (See Comments)    Other Reaction(s): Hypertension   Amlodipine  Swelling and Other (See Comments)    Limbs swell, not the throat   Lanolin Other (See Comments)    Sneezing and watery eyes. Allergic to Wool.  Other Reaction(s): Other (See Comments)  Sneezing and watery eyes. Allergic to Wool.    Sneezing and watery eyes. Allergic to Wool.   Silicone Dermatitis and Other (See Comments)    Redness (Bandaids also)   Tape Other (See Comments)    Redness (Bandaids also)    Past Medical History, Surgical history, Social history, and Family History were reviewed and updated.  Review of Systems: Review of Systems  Constitutional: Negative.   HENT:  Negative.    Eyes: Negative.   Respiratory: Negative.    Cardiovascular: Negative.   Gastrointestinal: Negative.   Endocrine: Negative.   Genitourinary: Negative.    Musculoskeletal:  Positive for arthralgias.  Skin: Negative.   Neurological: Negative.   Hematological: Negative.   Psychiatric/Behavioral: Negative.      Physical Exam: Vital signs show temperature of 98.6.   Pulse 66.  Blood pressure 125/49.  Weight is 183 pounds.   Wt Readings from Last 3 Encounters:  06/05/24 183 lb 1.3 oz (83 kg)  04/29/24 188 lb (85.3 kg)  04/23/24 184 lb (83.5 kg)    Physical Exam Vitals reviewed.  HENT:     Head: Normocephalic and atraumatic.  Eyes:     Pupils: Pupils are equal, round, and reactive to light.  Cardiovascular:     Rate and Rhythm: Normal rate and regular rhythm.  Heart sounds: Normal heart sounds.  Pulmonary:     Effort: Pulmonary effort is normal.     Breath sounds: Normal breath sounds.  Abdominal:     General: Bowel sounds are normal.     Palpations: Abdomen is soft.  Musculoskeletal:        General: No tenderness or deformity. Normal range of motion.     Cervical back: Normal range of motion.     Comments: She has about 2+ edema in the lower legs.  I cannot palpate any venous cord.  She has a negative Homans' sign.  Lymphadenopathy:     Cervical: No cervical adenopathy.  Skin:    General: Skin is warm and dry.     Findings: No erythema or rash.  Neurological:     Mental Status: She is alert and oriented to person, place, and time.  Psychiatric:        Behavior: Behavior normal.        Thought Content: Thought content normal.        Judgment: Judgment normal.      Lab Results  Component Value Date   WBC 5.3 06/05/2024   HGB 11.5 (L) 06/05/2024   HCT 34.2 (L) 06/05/2024   MCV 101.2 (H) 06/05/2024   PLT 214 06/05/2024     Chemistry      Component Value Date/Time   NA 141 06/05/2024 0919   NA 142 07/17/2023 1358   NA 142 05/29/2017 1002   K 3.5 06/05/2024 0919   K 3.9 05/29/2017 1002   CL 99 06/05/2024 0919   CO2 28 06/05/2024 0919   CO2 28 05/29/2017 1002   BUN 35 (H) 06/05/2024 0919   BUN 26 07/17/2023 1358   BUN 13.5 05/29/2017 1002   CREATININE 1.14 (H) 06/05/2024 0919   CREATININE 0.81 04/06/2020 1059   CREATININE 0.8 05/29/2017 1002      Component Value Date/Time   CALCIUM  11.0 (H) 06/05/2024 0919    CALCIUM  9.4 05/29/2017 1002   ALKPHOS 44 06/05/2024 0919   ALKPHOS 39 (L) 05/29/2017 1002   AST 21 06/05/2024 0919   AST 18 05/29/2017 1002   ALT 21 06/05/2024 0919   ALT 19 05/29/2017 1002   BILITOT 0.2 06/05/2024 0919   BILITOT 0.48 05/29/2017 1002       Impression and Plan: Laura Bush is a very charming 73 year old white female.  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  This was actually a small lesion.  I think the real key is going to be this PET scan.  We will have to see what it shows.  I know that there will will be a conference out of [facility] regarding the PET scan results.  They may be able to help us  out with any recommendations.  For my opinion, I have to believe that the Lorbrena  is working quite well.  I would be very surprised if she did have any problems with active malignancy.  We will plan to get her back after Thanksgiving.  She will get her Xgeva  today.  She will continue on her diuretics.   Maude JONELLE Crease, MD 10/29/202510:37 AM

## 2024-06-05 NOTE — Patient Instructions (Signed)
 Denosumab  Injection (Oncology) What is this medication? DENOSUMAB  (den oh SUE mab) prevents weakened bones caused by cancer. It may also be used to treat noncancerous bone tumors that cannot be removed by surgery. It can also be used to treat high calcium  levels in the blood caused by cancer. It works by blocking a protein that causes bones to break down quickly. This slows down the release of calcium  from bones, which lowers calcium  levels in your blood. It also makes your bones stronger and less likely to break (fracture). This medicine may be used for other purposes; ask your health care provider or pharmacist if you have questions. COMMON BRAND NAME(S): XGEVA  What should I tell my care team before I take this medication? They need to know if you have any of these conditions: Dental disease Having surgery or tooth extraction Infection Kidney disease Low levels of calcium  or vitamin D  in the blood Malnutrition On hemodialysis Skin conditions or sensitivity Thyroid  or parathyroid disease An unusual reaction to denosumab , other medications, foods, dyes, or preservatives Pregnant or trying to get pregnant Breast-feeding How should I use this medication? This medication is for injection under the skin. It is given by your care team in a hospital or clinic setting. A special MedGuide will be given to you before each treatment. Be sure to read this information carefully each time. Talk to your care team about the use of this medication in children. While it may be prescribed for children as young as 13 years for selected conditions, precautions do apply. Overdosage: If you think you have taken too much of this medicine contact a poison control center or emergency room at once. NOTE: This medicine is only for you. Do not share this medicine with others. What if I miss a dose? Keep appointments for follow-up doses. It is important not to miss your dose. Call your care team if you are unable to  keep an appointment. What may interact with this medication? Do not take this medication with any of the following: Other medications containing denosumab  This medication may also interact with the following: Medications that lower your chance of fighting infection Steroid medications, such as prednisone  or cortisone This list may not describe all possible interactions. Give your health care provider a list of all the medicines, herbs, non-prescription drugs, or dietary supplements you use. Also tell them if you smoke, drink alcohol, or use illegal drugs. Some items may interact with your medicine. What should I watch for while using this medication? Your condition will be monitored carefully while you are receiving this medication. You may need blood work while taking this medication. This medication may increase your risk of getting an infection. Call your care team for advice if you get a fever, chills, sore throat, or other symptoms of a cold or flu. Do not treat yourself. Try to avoid being around people who are sick. You should make sure you get enough calcium  and vitamin D  while you are taking this medication, unless your care team tells you not to. Discuss the foods you eat and the vitamins you take with your care team. Some people who take this medication have severe bone, joint, or muscle pain. This medication may also increase your risk for jaw problems or a broken thigh bone. Tell your care team right away if you have severe pain in your jaw, bones, joints, or muscles. Tell your care team if you have any pain that does not go away or that gets worse. Talk  to your care team if you may be pregnant. Serious birth defects can occur if you take this medication during pregnancy and for 5 months after the last dose. You will need a negative pregnancy test before starting this medication. Contraception is recommended while taking this medication and for 5 months after the last dose. Your care team  can help you find the option that works for you. What side effects may I notice from receiving this medication? Side effects that you should report to your care team as soon as possible: Allergic reactions--skin rash, itching, hives, swelling of the face, lips, tongue, or throat Bone, joint, or muscle pain Low calcium  level--muscle pain or cramps, confusion, tingling, or numbness in the hands or feet Osteonecrosis of the jaw--pain, swelling, or redness in the mouth, numbness of the jaw, poor healing after dental work, unusual discharge from the mouth, visible bones in the mouth Side effects that usually do not require medical attention (report to your care team if they continue or are bothersome): Cough Diarrhea Fatigue Headache Nausea This list may not describe all possible side effects. Call your doctor for medical advice about side effects. You may report side effects to FDA at 1-800-FDA-1088. Where should I keep my medication? This medication is given in a hospital or clinic. It will not be stored at home. NOTE: This sheet is a summary. It may not cover all possible information. If you have questions about this medicine, talk to your doctor, pharmacist, or health care provider.  2024 Elsevier/Gold Standard (2021-12-15 00:00:00)

## 2024-06-09 ENCOUNTER — Other Ambulatory Visit: Payer: Self-pay | Admitting: Family Medicine

## 2024-06-09 DIAGNOSIS — K219 Gastro-esophageal reflux disease without esophagitis: Secondary | ICD-10-CM

## 2024-06-14 ENCOUNTER — Encounter: Payer: Self-pay | Admitting: Radiation Oncology

## 2024-06-14 ENCOUNTER — Other Ambulatory Visit: Payer: Self-pay | Admitting: Family Medicine

## 2024-06-14 DIAGNOSIS — H2513 Age-related nuclear cataract, bilateral: Secondary | ICD-10-CM | POA: Diagnosis not present

## 2024-06-14 DIAGNOSIS — R0982 Postnasal drip: Secondary | ICD-10-CM

## 2024-06-17 ENCOUNTER — Ambulatory Visit
Admission: RE | Admit: 2024-06-17 | Discharge: 2024-06-17 | Disposition: A | Discharge status: Home / Self Care | Source: Ambulatory Visit | Attending: Radiation Oncology | Admitting: Radiation Oncology

## 2024-06-17 ENCOUNTER — Encounter: Payer: Self-pay | Admitting: Radiation Oncology

## 2024-06-17 DIAGNOSIS — Z79899 Other long term (current) drug therapy: Secondary | ICD-10-CM | POA: Diagnosis not present

## 2024-06-17 DIAGNOSIS — Z7984 Long term (current) use of oral hypoglycemic drugs: Secondary | ICD-10-CM | POA: Insufficient documentation

## 2024-06-17 DIAGNOSIS — Z923 Personal history of irradiation: Secondary | ICD-10-CM | POA: Insufficient documentation

## 2024-06-17 DIAGNOSIS — C3490 Malignant neoplasm of unspecified part of unspecified bronchus or lung: Secondary | ICD-10-CM

## 2024-06-17 DIAGNOSIS — C7951 Secondary malignant neoplasm of bone: Secondary | ICD-10-CM | POA: Diagnosis not present

## 2024-06-17 DIAGNOSIS — C3432 Malignant neoplasm of lower lobe, left bronchus or lung: Secondary | ICD-10-CM | POA: Insufficient documentation

## 2024-06-17 NOTE — Progress Notes (Signed)
 Radiation Oncology         [phone]) [phone] ________________________________  Name: Laura Bush MRN: [id number]  Date: 06/17/2024  DOB: 1951/02/11  Follow-Up Visit Note  CC: Copland, Harlene BROCKS, MD  Copland, Harlene BROCKS, MD    ICD-10-CM   1. 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   Interval Since Last Radiation: 1 month  Intent: Palliative  Radiation Treatment Dates: First Treatment Date: 2024-05-02 -- Last Treatment Date: 2024-05-17 Site/Dose/Technique/Mode:  Plan Name: Ext_R Site: Femur Right Technique: Isodose Plan Mode: Photon Dose Per Fraction: 3 Gy Prescribed Dose (Delivered / Prescribed): 15 Gy / 15 Gy Prescribed Fxs (Delivered / Prescribed): 5 / 5   Plan Name: Ext_R_Bst Site: Femur Right Technique: 3D Mode: Photon Dose Per Fraction: 3 Gy Prescribed Dose (Delivered / Prescribed): 15 Gy / 15 Gy Prescribed Fxs (Delivered / Prescribed): 5 / 5    Narrative:  The patient returns today for routine follow-up. She did develop some discomfort along the medial knee and episodes of muscle cramping along the thigh area during treatment. She otherwise tolerated radiation therapy relatively well overall.      Since completing radiation therapy, she followed up with Dr. Timmy on 06/05/24. She was noted to be doing relatively well at that time and denied any changes. She also received her dose of xgeva  at that time.   She is due for a restaging PET scan which is scheduled for 06/27/24.   No other significant interval history since the patient completed radiation therapy.   Patient notes improvement in her leg pain since the radiation, rating it 2/10. She is now using a walker to aid in  ambulation. She notes an outward leaning appearance of her leg since completing her radiation. She notes chronic swelling of the leg, but denies any new swelling or pain. She is experiencing new right knee pain for which she is seeing an orthopedist for on Monday 06/24/2024.                       Allergies:  is allergic to diclofenac , amlodipine , lanolin, silicone, and tape.  Meds: Current Outpatient Medications  Medication Sig Dispense Refill   acetaminophen  (TYLENOL ) 500 MG tablet Take 1 tablet (500 mg total) by mouth every 8 (eight) hours as needed for mild pain or moderate pain. 30 tablet 0   AIRSUPRA  90-80 MCG/ACT AERO INHALE 2 PUFFS BY MOUTH EVERY 6 HOURS AS NEEDED 10.7 g 1   aspirin EC 81 MG tablet Take 81 mg by mouth in the morning. Swallow whole.     B Complex-C (SUPER B COMPLEX PO) Take 1 tablet by mouth daily with breakfast.     Calcium  Carb-Cholecalciferol (CALCIUM  + D3 PO) Take 1 tablet by mouth in the morning.     cholecalciferol (VITAMIN D3) 25 MCG (1000 UNIT) tablet Take 1,000 Units by mouth in the morning.     COLLAGEN PO Take 3 tablets by mouth in the morning and at bedtime. With Biotin (Patient taking differently: Take 2 tablets by mouth daily. With Biotin)     Denosumab  (XGEVA  [place]) Inject into the skin every 3 (three) months.     Evolocumab  (REPATHA  SURECLICK)  140 MG/ML SOAJ Inject 140 mg into the skin every 14 (fourteen) days. 6 mL 3   fexofenadine (ALLEGRA) 180 MG tablet Take 180 mg by mouth in the morning.     folic acid  (FOLATE) 400 MCG tablet Take 400 mcg by mouth in the morning.     furosemide  (LASIX ) 20 MG tablet TAKE 2 TABLETS BY MOUTH DAILY 180 tablet 3   ibuprofen (ADVIL) 200 MG tablet Take 600 mg by mouth daily as needed.     ipratropium (ATROVENT ) 0.06 % nasal spray Place 2 sprays into both nostrils 4 (four) times daily as needed for rhinitis. 15 mL 4   lorlatinib  (LORBRENA ) 100 MG tablet Take 1 tablet (100 mg total) by mouth daily. Swallow tablets whole. Do not  chew, crush or split tablets. 30 tablet 0   metFORMIN  (GLUCOPHAGE ) 500 MG tablet Take 1 tablet by mouth daily 90 tablet 3   metolazone  (ZAROXOLYN ) 5 MG tablet TAKE 1 TABLET BY MOUTH DAILY TAKE ABOUT 30-45 MINUTES PRIOR TO LASIX  EACH DAY 30 tablet 4   NIFEdipine  (PROCARDIA  XL/NIFEDICAL XL) 60 MG 24 hr tablet TAKE 1 TABLET BY MOUTH DAILY 90 tablet 3   oxaprozin  (DAYPRO ) 600 MG tablet Take 1 tablet (600 mg total) by mouth daily. Please take in the morning with breakfast. 30 tablet 4   pantoprazole  (PROTONIX ) 40 MG tablet Take 1 tablet (40 mg total) by mouth daily. 90 tablet 0   potassium chloride  SA (KLOR-CON  M) 20 MEQ tablet TAKE 1 TABLET BY MOUTH 2 TIMES A DAY 60 tablet 5   spironolactone  (ALDACTONE ) 25 MG tablet Take 1 tablet (25 mg total) by mouth daily. 90 tablet 3   tirzepatide  (MOUNJARO ) 5 MG/0.5ML Pen Inject 5 mg into the skin once a week. 2 mL 1   traZODone  (DESYREL ) 50 MG tablet TAKE 1 TABLET BY MOUTH EVERY NIGHT AT BEDTIME AS NEEDED FOR SLEEP 90 tablet 2   valsartan  (DIOVAN ) 80 MG tablet TAKE 1 TABLET BY MOUTH DAILY 90 tablet 1   No current facility-administered medications for this encounter.    Physical Findings: The patient is in no acute distress. Patient is alert and oriented.  height is 4' 10.5 (1.486 m) (pended) and weight is 180 lb 4 oz (81.8 kg) (pended). Her temporal temperature is 97.5 F (36.4 C) (abnormal, pended). Her blood pressure is 135/58 (abnormal, pended) and her pulse is 79 (pended). Her respiration is 18 (pended) and oxygen saturation is 100% (pended). .  In general this is a well appearing female in no acute distress. She's alert and oriented x4 and appropriate throughout the examination. Cardiopulmonary assessment is negative for acute distress and she exhibits normal effort.     Diffuse swelling around the right leg. No erythema. Antalgic gait. Patient uses a walker for ambulation.    Lab Findings: Lab Results  Component Value Date   WBC 5.3 06/05/2024    HGB 11.5 (L) 06/05/2024   HCT 34.2 (L) 06/05/2024   MCV 101.2 (H) 06/05/2024   PLT 214 06/05/2024    Radiographic Findings: No results found.  ImpressionPlan:  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; s/p radiation completed on 01/03/2024. She developed progressive disease to the right distal femur in September 2025; s/p palliative radiation completed on 05/17/2024.    Patient has healed well from the effects of her radiation treatment, and noted some improvement in her right hip/leg pain. She is now using a walker  to ambulate, which is improvement from her wheelchair during treatment. She is seeing an orthopedic surgeon for knee pain on Monday, 06/24/2024.   She will continue on immunotherapy under the care of Dr. Timmy.  She is scheduled for a restaging PET on 07/05/2024 and will follow-up with Dr. Timmy to be the results.   Radiation follow-up PRN.  We appreciate the opportunity to take part in this patient's care.  She was encouraged to call back with any questions or concerns.   20 minutes of total time was spent for this patient encounter, including preparation, face-to-face counseling with the patient and coordination of care, physical exam, and documentation of the encounter. ____________________________________    Leeroy Due, PA-C    This document serves as a record of services personally performed by Leeroy Due, PA-C. It was created on his behalf by Dorthy Fuse, a trained medical scribe. The creation of this record is based on the scribe's personal observations and the provider's statements to them. This document has been checked and approved by the attending provider.

## 2024-06-17 NOTE — Progress Notes (Signed)
 Laura Bush is here today for follow up post radiation to the right femur  They completed their radiation on: 05/17/24   Does the patient complain of any of the following:  Pain: Yes, reports mild pain to right hip, rating 2/10.  Fatigue:Yes, mild Post radiation skin changes:  No   Additional comments if applicable: Patient reports leg turns outward and she continues to have swelling to right leg.      BP (!) (P) 135/58 (BP Location: Left Arm, Patient Position: Sitting)   Pulse (P) 79   Temp (!) (P) 97.5 F (36.4 C) (Temporal)   Resp (P) 18   Ht (P) 4' 10.5 (1.486 m)   Wt (P) 180 lb 4 oz (81.8 kg)   SpO2 (P) 100%   BMI (P) 37.03 kg/m

## 2024-06-23 ENCOUNTER — Other Ambulatory Visit (HOSPITAL_BASED_OUTPATIENT_CLINIC_OR_DEPARTMENT_OTHER): Payer: Self-pay | Admitting: Cardiovascular Disease

## 2024-06-24 ENCOUNTER — Other Ambulatory Visit (INDEPENDENT_AMBULATORY_CARE_PROVIDER_SITE_OTHER): Payer: Self-pay

## 2024-06-24 ENCOUNTER — Encounter: Payer: Self-pay | Admitting: Hematology & Oncology

## 2024-06-24 ENCOUNTER — Ambulatory Visit: Admitting: Orthopedic Surgery

## 2024-06-24 DIAGNOSIS — M79604 Pain in right leg: Secondary | ICD-10-CM | POA: Diagnosis not present

## 2024-06-24 DIAGNOSIS — M1711 Unilateral primary osteoarthritis, right knee: Secondary | ICD-10-CM | POA: Diagnosis not present

## 2024-06-25 ENCOUNTER — Encounter: Payer: Self-pay | Admitting: Family Medicine

## 2024-06-27 ENCOUNTER — Other Ambulatory Visit: Payer: Self-pay | Admitting: Hematology & Oncology

## 2024-06-27 ENCOUNTER — Ambulatory Visit (HOSPITAL_COMMUNITY)
Admission: RE | Admit: 2024-06-27 | Discharge: 2024-06-27 | Disposition: A | Discharge status: Home / Self Care | Source: Ambulatory Visit | Attending: Hematology & Oncology | Admitting: Hematology & Oncology

## 2024-06-27 ENCOUNTER — Other Ambulatory Visit: Payer: Self-pay

## 2024-06-27 ENCOUNTER — Other Ambulatory Visit (HOSPITAL_COMMUNITY): Payer: Self-pay

## 2024-06-27 DIAGNOSIS — C3490 Malignant neoplasm of unspecified part of unspecified bronchus or lung: Secondary | ICD-10-CM | POA: Diagnosis not present

## 2024-06-27 DIAGNOSIS — K8020 Calculus of gallbladder without cholecystitis without obstruction: Secondary | ICD-10-CM | POA: Diagnosis not present

## 2024-06-27 DIAGNOSIS — C7951 Secondary malignant neoplasm of bone: Secondary | ICD-10-CM | POA: Diagnosis not present

## 2024-06-27 DIAGNOSIS — M84451A Pathological fracture, right femur, initial encounter for fracture: Secondary | ICD-10-CM | POA: Diagnosis not present

## 2024-06-27 DIAGNOSIS — K5730 Diverticulosis of large intestine without perforation or abscess without bleeding: Secondary | ICD-10-CM | POA: Diagnosis not present

## 2024-06-27 LAB — GLUCOSE, CAPILLARY: Glucose-Capillary: 99 mg/dL (ref 70–99)

## 2024-06-27 MED ORDER — LORLATINIB 100 MG PO TABS
100.0000 mg | ORAL_TABLET | Freq: Every day | ORAL | 0 refills | Status: DC
  Filled 2024-06-27 (×2): qty 30, 30d supply, fill #0

## 2024-06-27 MED ORDER — FLUDEOXYGLUCOSE F - 18 (FDG) INJECTION
8.0000 | Freq: Once | INTRAVENOUS | Status: AC
  Administered 2024-06-27: 8.86 via INTRAVENOUS

## 2024-06-27 NOTE — Progress Notes (Signed)
 Specialty Pharmacy Refill Coordination Note  Laura Bush is a 73 y.o. female contacted today regarding refills of specialty medication(s) Lorlatinib  (LORBRENA )   Patient requested (Patient-Rptd) Delivery   Delivery date: 07/01/24   Verified address: (Patient-Rptd) [address] [facility] [address]   Medication will be filled on: 06/28/24

## 2024-06-28 ENCOUNTER — Encounter: Payer: Self-pay | Admitting: Orthopedic Surgery

## 2024-06-28 ENCOUNTER — Other Ambulatory Visit: Payer: Self-pay

## 2024-06-28 NOTE — Progress Notes (Unsigned)
 Office Visit Note   Patient: Laura Bush           Date of Birth: September 03, 1950           MRN: [id number] Visit Date: 06/24/2024 Requested by: Watt Harlene BROCKS, MD [address] [place],  [place] [address] PCP: Watt Harlene BROCKS, MD  Subjective: Chief Complaint  Patient presents with   Right Leg - Pain    HPI: Laura Bush is a 73 y.o. female who presents to the office reporting right knee and leg pain.  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  Patient feels like she has a screwdriver in her knee at times.  She has a history of arthroscopy about 10 years ago in both knees.  She presented with metastatic lung cancer with a femur fracture treated last year by Dr. Celena.  She subsequently had distal interlocking screws removed.  She describes continued pain diminished flexion as well as progressive deformity in that right knee and leg region.  She is here with her husband and son today.  She will be discussed at a [facility] multidisciplinary conference in the near future.  She has a PET scan pending for Thursday.              ROS: All systems reviewed are negative as they relate to the chief complaint within the history of present illness.  Patient denies fevers or chills.  Assessment & Plan: Visit Diagnoses:  1. Pain in right leg     Plan: Impression is progressive varus deformity in the right knee due to metastatic disease in the distal right femur.  Patient also has arthritis in the knee.  I think there has been some collapse of the fracture and varus alignment of the fracture since distal interlocking screws were removed.  Cortisone injection performed today into the knee for pain relief.  Did give her a copy of her radiographs today.  I think in general activity modification will be the best way to manage this current situation.  Could consider episodic knee injections as well.  Would not really recommend nail removal at this time due to the amount  of metastasis present in that distal femur.  Follow-Up Instructions: No follow-ups on file.   Orders:  Orders Placed This Encounter  Procedures   XR FEMUR, MIN 2 VIEWS RIGHT   XR KNEE 3 VIEW RIGHT   No orders of the defined types were placed in this encounter.     Procedures: Large Joint Inj: R knee on 06/24/2024 3:51 PM Indications: diagnostic evaluation, joint swelling and pain Details: 18 G 1.5 in needle, superolateral approach  Arthrogram: No  Medications: 5 mL lidocaine  1 %; 4 mL bupivacaine  0.25 %; 40 mg triamcinolone  acetonide 40 MG/ML Outcome: tolerated well, no immediate complications Procedure, treatment alternatives, risks and benefits explained, specific risks discussed. Consent was given by the patient. Immediately prior to procedure a time out was called to verify the correct patient, procedure, equipment, support staff and site/side marked as required. Patient was prepped and draped in the usual sterile fashion.       Clinical Data: No additional findings.  Objective: Vital Signs: There were no vitals taken for this visit.  Physical Exam:  Constitutional: Patient appears well-developed HEENT:  Head: Normocephalic Eyes:EOM are normal Neck: Normal range of motion Cardiovascular: Normal rate Pulmonary/chest: Effort normal Neurologic: Patient is alert Skin: Skin is warm Psychiatric: Patient has normal mood and affect  Ortho  Exam: Ortho exam demonstrates varus alignment of the right lower extremity.  Patient lacks about 5 degrees of full extension and Has about 85 degrees of flexion.  Collateral ligaments are stable.  No groin pain with internal or external rotation of the right leg.  Does have moderately more swelling in the right lower extremity compared to the left but negative Homans and no calf tenderness.  No effusion is present in the right knee.  Specialty Comments:  No specialty comments available.  Imaging: No results found.   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 Denver 04/21/2020   Osteopenia 10/02/2018   Malignant 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, estrogen receptor positive (HCC) 05/29/2017   Ductal carcinoma in situ (DCIS) of right breast 05/29/2017   Vitamin D  deficiency 01/29/2016   Eczema 12/29/2014   Venous insufficiency 10/16/2014   Primary hypertension 10/01/2014   Hypercholesteremia 10/01/2014   GERD (gastroesophageal reflux disease) 10/01/2014   Obesity 10/23/2013   Obstructive sleep apnea syndrome 07/19/2013   Nodule of finger 12/24/2009   Other bilateral bundle branch block 06/23/2003   Past Medical History:  Diagnosis Date   Allergy    seasonal   Anemia associated with stage 3 chronic renal failure (HCC) 04/18/2023   Arthritis Last several years   Mostly in hands, wrists, shoulders, knees   Asthma    in cold weather   Breast cancer (HCC) 09/18/2014   left breast   Breast cancer (HCC) 10/07/2014   bx left  breast   Breast cancer of upper-outer quadrant of left female breast (HCC) 09/22/2014   Cluster headaches    in her 40's   GERD (gastroesophageal reflux disease)    Goals of care, counseling/discussion 01/14/2022   Heart murmur    as a child (has outgrown)   High cholesterol    History of radiation therapy    Left lung, Right Femur- 02/16/22-03/04/22- Dr. Lynwood Nasuti   History of radiation therapy    Right Pelvis-12/20/23-01/03/24-Dr. Lynwood Nasuti   History of radiation therapy    Right femur-05/02/24-05/17/24- Dr. 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   Stroke [facility]) summer 2022   described as tiny   Swallowing difficulty    Venous insufficiency     Family History  Problem Relation Age of Onset   Dementia Mother        Lives in [place]   Hypertension Mother    Hyperlipidemia Mother    Stroke Father        deceased   Heart failure Father 59   Hypertension Father    Uterine cancer Sister 59   Heart attack Maternal Grandmother    Hypertension Maternal Grandfather    Stroke Paternal Grandfather    Colon cancer Neg Hx    Esophageal cancer Neg Hx    Pancreatic cancer Neg Hx    Stomach cancer Neg Hx    Liver disease Neg Hx    Sleep apnea Neg Hx     Past Surgical History:  Procedure Laterality Date   ABDOMINAL HYSTERECTOMY  1999   APPENDECTOMY  ~ 2006   BONE BIOPSY Right 01/04/2022   Procedure: RIGHT FEMORAL BONE BIOPSY;  Surgeon: Celena Sharper, MD;  Location: [facility] OR;  Service: Orthopedics;   Laterality: Right;   BONE EXCISION Right 04/11/2023   Procedure: BONE EXCISION;  Surgeon: Celena Sharper, MD;  Location: [facility] OR;  Service: Orthopedics;  Laterality: Right;   BREAST BIOPSY Left 10/2014   BREAST LUMPECTOMY Left 2016   BREAST REDUCTION SURGERY Bilateral 11/11/2014   Procedure: Bilateral Breast Reduction;  Surgeon: Alm Sick, MD;  Location: [facility] OR;  Service: Plastics;  Laterality: Bilateral;   BRONCHIAL BIOPSY  01/06/2022   Procedure: BRONCHIAL BIOPSIES;  Surgeon: Shelah Lamar RAMAN, MD;  Location: [facility] ENDOSCOPY;  Service: Pulmonary;;   BRONCHIAL BRUSHINGS  01/06/2022   Procedure: BRONCHIAL BRUSHINGS;  Surgeon: Shelah Lamar RAMAN, MD;  Location: [facility] ENDOSCOPY;  Service: Pulmonary;;   BRONCHIAL NEEDLE ASPIRATION BIOPSY  01/06/2022   Procedure: BRONCHIAL NEEDLE ASPIRATION BIOPSIES;  Surgeon: Shelah Lamar RAMAN, MD;  Location: [facility] ENDOSCOPY;  Service: Pulmonary;;   BRONCHIAL WASHINGS  01/06/2022   Procedure: BRONCHIAL WASHINGS;  Surgeon: Shelah Lamar RAMAN, MD;  Location: [facility] ENDOSCOPY;  Service: Pulmonary;;   CARPAL TUNNEL RELEASE Bilateral    2 surgeries on right, 1 on left   CESAREAN SECTION  1978; 1980   COLONOSCOPY     FEMUR IM NAIL Right 01/04/2022   Procedure: RIGHT FEMORAL INTRAMEDULLARY (IM) NAIL;  Surgeon: Celena Sharper, MD;  Location: [facility] OR;  Service: Orthopedics;  Laterality: Right;   FIDUCIAL MARKER PLACEMENT  01/06/2022   Procedure: FIDUCIAL MARKER PLACEMENT;  Surgeon: Shelah Lamar RAMAN, MD;  Location: [facility] ENDOSCOPY;  Service: Pulmonary;;   FOREARM FRACTURE SURGERY Right 2003 X 3   FRACTURE SURGERY  2003, 2023   3 surgeries, right arm -03; Femur-23   HARDWARE REMOVAL Right 04/11/2023   Procedure: HARDWARE REMOVAL;  Surgeon: Celena Sharper, MD;  Location: [facility] OR;  Service: Orthopedics;  Laterality: Right;   KNEE ARTHROSCOPY Bilateral    meniscus repair   PARTIAL MASTECTOMY WITH NEEDLE LOCALIZATION AND AXILLARY SENTINEL LYMPH NODE BX Left 11/11/2014   Procedure: LEFT BREAST PARTIAL  MASTECTOMY WITH NEEDLE LOCALIZATION TIMES TWO AND LEFT AXILLARY SENTINEL LYMPH NODE Biopsy;  Surgeon: Elon Pacini, MD;  Location: [facility] OR;  Service: General;  Laterality: Left;   REDUCTION MAMMAPLASTY Bilateral 2016   SQUAMOUS CELL CARCINOMA EXCISION  1980's X 1  nose   TONSILLECTOMY  ~ 1957   TUBAL LIGATION  ?1984   Social History   Occupational History   Occupation: retired  Tobacco Use   Smoking status: Never   Smokeless tobacco: Never  Vaping Use   Vaping status: Never Used  Substance and Sexual Activity   Alcohol use: Yes    Alcohol/week: 2.0 standard drinks of alcohol    Types: 2 Glasses of wine per week    Comment: 2 glasses a week   Drug use: No   Sexual activity: Not Currently

## 2024-06-30 MED ORDER — TRIAMCINOLONE ACETONIDE 40 MG/ML IJ SUSP
40.0000 mg | INTRAMUSCULAR | Status: AC | PRN
  Administered 2024-06-24: 40 mg via INTRA_ARTICULAR

## 2024-06-30 MED ORDER — BUPIVACAINE HCL 0.25 % IJ SOLN
4.0000 mL | INTRAMUSCULAR | Status: AC | PRN
  Administered 2024-06-24: 4 mL via INTRA_ARTICULAR

## 2024-06-30 MED ORDER — LIDOCAINE HCL 1 % IJ SOLN
5.0000 mL | INTRAMUSCULAR | Status: AC | PRN
  Administered 2024-06-24: 5 mL

## 2024-07-01 ENCOUNTER — Encounter: Payer: Self-pay | Admitting: Hematology & Oncology

## 2024-07-01 ENCOUNTER — Encounter: Payer: Self-pay | Admitting: Orthopedic Surgery

## 2024-07-03 ENCOUNTER — Other Ambulatory Visit: Payer: Self-pay | Admitting: Family Medicine

## 2024-07-03 DIAGNOSIS — R7303 Prediabetes: Secondary | ICD-10-CM

## 2024-07-08 ENCOUNTER — Inpatient Hospital Stay: Attending: Hematology & Oncology

## 2024-07-08 ENCOUNTER — Inpatient Hospital Stay: Admitting: Hematology & Oncology

## 2024-07-08 ENCOUNTER — Encounter: Payer: Self-pay | Admitting: Hematology & Oncology

## 2024-07-08 ENCOUNTER — Other Ambulatory Visit: Payer: Self-pay

## 2024-07-08 VITALS — BP 119/60 | HR 70 | Temp 98.1°F | Resp 18 | Ht 58.5 in | Wt 179.0 lb

## 2024-07-08 DIAGNOSIS — C3490 Malignant neoplasm of unspecified part of unspecified bronchus or lung: Secondary | ICD-10-CM | POA: Diagnosis not present

## 2024-07-08 DIAGNOSIS — D631 Anemia in chronic kidney disease: Secondary | ICD-10-CM | POA: Diagnosis not present

## 2024-07-08 DIAGNOSIS — C7951 Secondary malignant neoplasm of bone: Secondary | ICD-10-CM

## 2024-07-08 DIAGNOSIS — N183 Chronic kidney disease, stage 3 unspecified: Secondary | ICD-10-CM

## 2024-07-08 DIAGNOSIS — C3432 Malignant neoplasm of lower lobe, left bronchus or lung: Secondary | ICD-10-CM | POA: Insufficient documentation

## 2024-07-08 DIAGNOSIS — D509 Iron deficiency anemia, unspecified: Secondary | ICD-10-CM | POA: Diagnosis not present

## 2024-07-08 LAB — CMP (CANCER CENTER ONLY)
ALT: 27 U/L (ref 0–44)
AST: 25 U/L (ref 15–41)
Albumin: 4.4 g/dL (ref 3.5–5.0)
Alkaline Phosphatase: 47 U/L (ref 38–126)
Anion gap: 13 (ref 5–15)
BUN: 36 mg/dL — ABNORMAL HIGH (ref 8–23)
CO2: 29 mmol/L (ref 22–32)
Calcium: 10.1 mg/dL (ref 8.9–10.3)
Chloride: 98 mmol/L (ref 98–111)
Creatinine: 1.17 mg/dL — ABNORMAL HIGH (ref 0.44–1.00)
GFR, Estimated: 49 mL/min — ABNORMAL LOW (ref >60)
Glucose, Bld: 99 mg/dL (ref 70–99)
Potassium: 4 mmol/L (ref 3.5–5.1)
Sodium: 141 mmol/L (ref 135–145)
Total Bilirubin: 0.3 mg/dL (ref 0.0–1.2)
Total Protein: 7.7 g/dL (ref 6.5–8.1)

## 2024-07-08 LAB — CBC WITH DIFFERENTIAL (CANCER CENTER ONLY)
Abs Immature Granulocytes: 0.13 K/uL — ABNORMAL HIGH (ref 0.00–0.07)
Basophils Absolute: 0.1 K/uL (ref 0.0–0.1)
Basophils Relative: 1 %
Eosinophils Absolute: 0.3 K/uL (ref 0.0–0.5)
Eosinophils Relative: 4 %
HCT: 34.3 % — ABNORMAL LOW (ref 36.0–46.0)
Hemoglobin: 11.7 g/dL — ABNORMAL LOW (ref 12.0–15.0)
Immature Granulocytes: 2 %
Lymphocytes Relative: 16 %
Lymphs Abs: 1.1 K/uL (ref 0.7–4.0)
MCH: 33.9 pg (ref 26.0–34.0)
MCHC: 34.1 g/dL (ref 30.0–36.0)
MCV: 99.4 fL (ref 80.0–100.0)
Monocytes Absolute: 0.8 K/uL (ref 0.1–1.0)
Monocytes Relative: 11 %
Neutro Abs: 4.5 K/uL (ref 1.7–7.7)
Neutrophils Relative %: 66 %
Platelet Count: 182 K/uL (ref 150–400)
RBC: 3.45 MIL/uL — ABNORMAL LOW (ref 3.87–5.11)
RDW: 14 % (ref 11.5–15.5)
WBC Count: 6.8 K/uL (ref 4.0–10.5)
nRBC: 0 % (ref 0.0–0.2)

## 2024-07-08 LAB — IRON AND IRON BINDING CAPACITY (CC-WL,HP ONLY)
Iron: 93 ug/dL (ref 28–170)
Saturation Ratios: 31 % (ref 10.4–31.8)
TIBC: 300 ug/dL (ref 250–450)
UIBC: 207 ug/dL

## 2024-07-08 LAB — FERRITIN: Ferritin: 414 ng/mL — ABNORMAL HIGH (ref 11–307)

## 2024-07-08 NOTE — Progress Notes (Signed)
 Hematology and Oncology Follow Up Visit  Laura Bush [id number] 15-Nov-1950 73 y.o. 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 3 months  -next dose in 08/2024    S/P SBRT to LUL -- completed on 03/04/2022 S/P XRT to RIGHT femur -- completed on 03/04/2022 Alecensa  600 mg po BID -- start on 03/24/2022 --on hold starting 04/04/2023 -restart 300 mg p.o. twice daily on 05/04/2023 --DC on 05/26/2023 Lorbrena  100 mg po q day  -- start on 06/14/2023 Aranesp  300 mcg subcu every 3 weeks Venofer -given on 07/10/2023     Interim History:  Laura Bush is back for follow-up.  The real big news is that she is going to have knee surgery for the right knee.  This will be at [facility]  She is going to have a total knee and distal femoral replacement.  I have never heard of this before.  They show me pictures of the prosthesis.  It looks incredibly interesting.  Hopefully this will help her.  She can barely bend her knee right now.  This should be able to give her much better quality of life.  She did have a PET scan.  The PET scan was done on 06/27/2024.  The PET scan did not show any activity other than the right hip area.  This area had decreased level of activity down to 3.9 with SUV.  This I think is indicative that there was a response to the radiotherapy.  I am not sure if [facility] has any plans to try to remove this.  Again, I think she is done incredibly well.  She is on Lorbrena .  I think she is doing well with this.  But I do have her on diuretics for leg swelling.  This is helped.  Also, being on Mounjaro  has helped.  I think her weight is down about 7 or 8 pounds since she was last here.  She really has had no problems with the Lorbrena .  She has had no cough or shortness  of breath.  She has had no change in bowel or bladder habits.  She has had no bleeding.  She has had no headache.  Overall, I would have to set her performance status is probably ECOG 1.    Medications:    Current Outpatient Medications:    acetaminophen  (TYLENOL ) 500 MG tablet, Take 1 tablet (500 mg total) by mouth every 8 (eight) hours as needed for mild pain or moderate pain., Disp: 30 tablet, Rfl: 0   AIRSUPRA  90-80 MCG/ACT AERO, INHALE 2 PUFFS BY MOUTH EVERY 6 HOURS AS NEEDED, Disp: 10.7 g, Rfl: 1   aspirin EC 81 MG tablet, Take 81 mg by mouth in the morning. Swallow whole., Disp: , Rfl:    B Complex-C (SUPER B COMPLEX PO), Take 1 tablet by mouth daily with breakfast., Disp: , Rfl:    Calcium  Carb-Cholecalciferol (CALCIUM  + D3 PO), Take 1 tablet by mouth in the morning., Disp: , Rfl:    cholecalciferol (VITAMIN D3) 25 MCG (1000 UNIT) tablet, Take 1,000 Units by mouth in the morning., Disp: , Rfl:    COLLAGEN PO, Take 3 tablets by mouth in the morning and at bedtime. With Biotin (Patient taking differently: Take 2 tablets by mouth daily. With Biotin), Disp: , Rfl:  Denosumab  (XGEVA  Farmer), Inject into the skin every 3 (three) months., Disp: , Rfl:    Evolocumab  (REPATHA  SURECLICK) 140 MG/ML SOAJ, Inject 140 mg into the skin every 14 (fourteen) days., Disp: 6 mL, Rfl: 3   fexofenadine (ALLEGRA) 180 MG tablet, Take 180 mg by mouth in the morning., Disp: , Rfl:    folic acid  (FOLATE) 400 MCG tablet, Take 400 mcg by mouth in the morning., Disp: , Rfl:    furosemide  (LASIX ) 20 MG tablet, TAKE 2 TABLETS BY MOUTH DAILY, Disp: 180 tablet, Rfl: 3   ibuprofen (ADVIL) 200 MG tablet, Take 600 mg by mouth daily as needed., Disp: , Rfl:    ipratropium (ATROVENT ) 0.06 % nasal spray, Place 2 sprays into both nostrils 4 (four) times daily as needed for rhinitis., Disp: 15 mL, Rfl: 4   lorlatinib  (LORBRENA ) 100 MG tablet, Take 1 tablet (100 mg total) by mouth daily. Swallow tablets whole. Do not chew, crush  or split tablets., Disp: 30 tablet, Rfl: 0   metFORMIN  (GLUCOPHAGE ) 500 MG tablet, Take 1 tablet by mouth daily, Disp: 90 tablet, Rfl: 3   metolazone  (ZAROXOLYN ) 5 MG tablet, TAKE 1 TABLET BY MOUTH DAILY TAKE ABOUT 30-45 MINUTES PRIOR TO LASIX  EACH DAY, Disp: 30 tablet, Rfl: 4   MOUNJARO  5 MG/0.5ML Pen, INJECT 5 MG UNDER THE SKIN ONCE WEEKLY, Disp: 2 mL, Rfl: 1   NIFEdipine  (PROCARDIA  XL/NIFEDICAL XL) 60 MG 24 hr tablet, TAKE 1 TABLET BY MOUTH DAILY, Disp: 90 tablet, Rfl: 3   oxaprozin  (DAYPRO ) 600 MG tablet, Take 1 tablet (600 mg total) by mouth daily. Please take in the morning with breakfast., Disp: 30 tablet, Rfl: 4   pantoprazole  (PROTONIX ) 40 MG tablet, Take 1 tablet (40 mg total) by mouth daily., Disp: 90 tablet, Rfl: 0   potassium chloride  SA (KLOR-CON  M) 20 MEQ tablet, TAKE 1 TABLET BY MOUTH 2 TIMES A DAY, Disp: 60 tablet, Rfl: 5   spironolactone  (ALDACTONE ) 25 MG tablet, TAKE 1 TABLET BY MOUTH DAILY, Disp: 90 tablet, Rfl: 1   traZODone  (DESYREL ) 50 MG tablet, TAKE 1 TABLET BY MOUTH EVERY NIGHT AT BEDTIME AS NEEDED FOR SLEEP, Disp: 90 tablet, Rfl: 2   valsartan  (DIOVAN ) 80 MG tablet, TAKE 1 TABLET BY MOUTH DAILY, Disp: 90 tablet, Rfl: 1  Allergies:  Allergies  Allergen Reactions   Diclofenac  Hypertension and Other (See Comments)    Other Reaction(s): Hypertension   Amlodipine  Swelling and Other (See Comments)    Limbs swell, not the throat   Lanolin Other (See Comments)    Sneezing and watery eyes. Allergic to Wool.  Other Reaction(s): Other (See Comments)  Sneezing and watery eyes. Allergic to Wool.    Sneezing and watery eyes. Allergic to Wool.   Silicone Dermatitis and Other (See Comments)    Redness (Bandaids also)   Tape Other (See Comments)    Redness (Bandaids also)    Past Medical History, Surgical history, Social history, and Family History were reviewed and updated.  Review of Systems: Review of Systems  Constitutional: Negative.   HENT:  Negative.    Eyes:  Negative.   Respiratory: Negative.    Cardiovascular: Negative.   Gastrointestinal: Negative.   Endocrine: Negative.   Genitourinary: Negative.    Musculoskeletal:  Positive for arthralgias.  Skin: Negative.   Neurological: Negative.   Hematological: Negative.   Psychiatric/Behavioral: Negative.      Physical Exam: Vital signs show temperature of 98.1.  Pulse 70.  Blood pressure 119/60.  Weight  is 179 pounds.    Wt Readings from Last 3 Encounters:  06/17/24 (P) 180 lb 4 oz (81.8 kg)  06/05/24 183 lb 1.3 oz (83 kg)  04/29/24 188 lb (85.3 kg)    Physical Exam Vitals reviewed.  HENT:     Head: Normocephalic and atraumatic.  Eyes:     Pupils: Pupils are equal, round, and reactive to light.  Cardiovascular:     Rate and Rhythm: Normal rate and regular rhythm.     Heart sounds: Normal heart sounds.  Pulmonary:     Effort: Pulmonary effort is normal.     Breath sounds: Normal breath sounds.  Abdominal:     General: Bowel sounds are normal.     Palpations: Abdomen is soft.  Musculoskeletal:        General: No tenderness or deformity. Normal range of motion.     Cervical back: Normal range of motion.     Comments: She has about 2+ edema in the lower legs.  I cannot palpate any venous cord.  She has a negative Homans' sign.  Lymphadenopathy:     Cervical: No cervical adenopathy.  Skin:    General: Skin is warm and dry.     Findings: No erythema or rash.  Neurological:     Mental Status: She is alert and oriented to person, place, and time.  Psychiatric:        Behavior: Behavior normal.        Thought Content: Thought content normal.        Judgment: Judgment normal.      Lab Results  Component Value Date   WBC 6.8 07/08/2024   HGB 11.7 (L) 07/08/2024   HCT 34.3 (L) 07/08/2024   MCV 99.4 07/08/2024   PLT 182 07/08/2024     Chemistry      Component Value Date/Time   NA 141 06/05/2024 0919   NA 142 07/17/2023 1358   NA 142 05/29/2017 1002   K 3.5 06/05/2024  0919   K 3.9 05/29/2017 1002   CL 99 06/05/2024 0919   CO2 28 06/05/2024 0919   CO2 28 05/29/2017 1002   BUN 35 (H) 06/05/2024 0919   BUN 26 07/17/2023 1358   BUN 13.5 05/29/2017 1002   CREATININE 1.14 (H) 06/05/2024 0919   CREATININE 0.81 04/06/2020 1059   CREATININE 0.8 05/29/2017 1002      Component Value Date/Time   CALCIUM  11.0 (H) 06/05/2024 0919   CALCIUM  9.4 05/29/2017 1002   ALKPHOS 44 06/05/2024 0919   ALKPHOS 39 (L) 05/29/2017 1002   AST 21 06/05/2024 0919   AST 18 05/29/2017 1002   ALT 21 06/05/2024 0919   ALT 19 05/29/2017 1002   BILITOT 0.2 06/05/2024 0919   BILITOT 0.48 05/29/2017 1002       Impression and Plan: Laura Bush is a very charming 73 year old white female.  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  This was actually a small lesion.  From my point of view, I do not see a problem with her having surgery.  Although, this will help her quality of life.  I still do worry about the activity in the right hip area.  Again, I am not sure if there is active cancer there or if what is there might just be inflammatory changes.  I think that what we will do with the Lorbrena 's have her stop this probably 5 days before surgery.  This should provide for less bleeding and  for improved healing.  I suspect that a problem take a little bit for her to get back here.  I think that they are going to try to stay in the [facility] area after the surgery to try to help facilitate her rehabilitation.  I am very happy that the PET scan does look this good.  Again, we will have to be aware of the right hip area.  I probably will plan to get her back in about 6 weeks or so.  When she comes back, she will get her Xgeva .   Maude JONELLE Crease, MD 12/1/20259:27 AM

## 2024-07-11 ENCOUNTER — Other Ambulatory Visit: Payer: Self-pay

## 2024-07-19 ENCOUNTER — Other Ambulatory Visit: Payer: Self-pay | Admitting: Hematology & Oncology

## 2024-07-19 ENCOUNTER — Other Ambulatory Visit (HOSPITAL_COMMUNITY): Payer: Self-pay

## 2024-07-19 ENCOUNTER — Encounter: Payer: Self-pay | Admitting: Family Medicine

## 2024-07-19 ENCOUNTER — Other Ambulatory Visit: Payer: Self-pay

## 2024-07-19 DIAGNOSIS — C7951 Secondary malignant neoplasm of bone: Secondary | ICD-10-CM

## 2024-07-19 MED ORDER — TIRZEPATIDE 7.5 MG/0.5ML ~~LOC~~ SOAJ: 7.5000 mg | SUBCUTANEOUS | 1 refills | Status: AC

## 2024-07-19 MED ORDER — LORLATINIB 100 MG PO TABS
100.0000 mg | ORAL_TABLET | Freq: Every day | ORAL | 0 refills | Status: DC
  Filled 2024-07-19: qty 30, 30d supply, fill #0

## 2024-07-19 NOTE — Progress Notes (Signed)
 Specialty Pharmacy Refill Coordination Note  MyChart Questionnaire Submission  Laura Bush is a 73 y.o. female contacted today regarding refills of specialty medication(s) Lorbrena .  Doses on hand: (Patient-Rptd) 27   Patient requested: (Patient-Rptd) Delivery   Delivery date: 08/06/24  Verified address: [address] [place] [address]  Medication will be filled on 08/05/24  This fill date is pending response to refill request from provider. Patient is aware and if they have not received fill by intended date, they must follow up with pharmacy.

## 2024-07-19 NOTE — Addendum Note (Signed)
 Addended by: WATT RAISIN C on: 07/19/2024 12:17 PM   Modules accepted: Orders

## 2024-07-24 DIAGNOSIS — M84551D Pathological fracture in neoplastic disease, right femur, subsequent encounter for fracture with routine healing: Secondary | ICD-10-CM | POA: Diagnosis not present

## 2024-07-24 DIAGNOSIS — G4733 Obstructive sleep apnea (adult) (pediatric): Secondary | ICD-10-CM | POA: Diagnosis not present

## 2024-07-24 DIAGNOSIS — C3490 Malignant neoplasm of unspecified part of unspecified bronchus or lung: Secondary | ICD-10-CM | POA: Diagnosis not present

## 2024-07-24 DIAGNOSIS — C7951 Secondary malignant neoplasm of bone: Secondary | ICD-10-CM | POA: Diagnosis not present

## 2024-07-24 DIAGNOSIS — E876 Hypokalemia: Secondary | ICD-10-CM | POA: Diagnosis not present

## 2024-07-24 DIAGNOSIS — E8881 Metabolic syndrome: Secondary | ICD-10-CM | POA: Diagnosis not present

## 2024-07-24 DIAGNOSIS — C50812 Malignant neoplasm of overlapping sites of left female breast: Secondary | ICD-10-CM | POA: Diagnosis not present

## 2024-07-24 DIAGNOSIS — I872 Venous insufficiency (chronic) (peripheral): Secondary | ICD-10-CM | POA: Diagnosis not present

## 2024-07-24 DIAGNOSIS — I452 Bifascicular block: Secondary | ICD-10-CM | POA: Diagnosis not present

## 2024-07-24 DIAGNOSIS — M7989 Other specified soft tissue disorders: Secondary | ICD-10-CM | POA: Diagnosis not present

## 2024-07-24 DIAGNOSIS — I700 Atherosclerosis of aorta: Secondary | ICD-10-CM | POA: Diagnosis not present

## 2024-07-24 DIAGNOSIS — E669 Obesity, unspecified: Secondary | ICD-10-CM | POA: Diagnosis not present

## 2024-07-24 DIAGNOSIS — E782 Mixed hyperlipidemia: Secondary | ICD-10-CM | POA: Diagnosis not present

## 2024-07-24 DIAGNOSIS — D0511 Intraductal carcinoma in situ of right breast: Secondary | ICD-10-CM | POA: Diagnosis not present

## 2024-07-24 DIAGNOSIS — E88819 Insulin resistance, unspecified: Secondary | ICD-10-CM | POA: Diagnosis not present

## 2024-07-24 DIAGNOSIS — M79604 Pain in right leg: Secondary | ICD-10-CM | POA: Diagnosis not present

## 2024-07-24 DIAGNOSIS — Z01818 Encounter for other preprocedural examination: Secondary | ICD-10-CM | POA: Diagnosis not present

## 2024-07-26 DIAGNOSIS — C7951 Secondary malignant neoplasm of bone: Secondary | ICD-10-CM | POA: Diagnosis not present

## 2024-07-29 ENCOUNTER — Other Ambulatory Visit (HOSPITAL_COMMUNITY): Payer: Self-pay

## 2024-07-30 NOTE — Progress Notes (Signed)
 Hemovac drain site cleansed with HCG swab and drain removed with tip intact and patient tolerated well. Drain site without bleeding. Gauze and Tegaderm dressing applied to drain site. Proximal aquacel dressing noted with some drainage. Dressing removed. No active oozing noted. New Aquacel dressing applied( 2 pieces). Pt tolerated well.     Margaretann Rush FNP Orthopedics.

## 2024-07-30 NOTE — Progress Notes (Signed)
 " Physical Therapy Progress Note  Patient Name:  Laura Bush Date of Therapy Session: 07/30/24 Time of Therapy Session:  1240 Duration of Session:  28 Minutes Room/Bed: 11B33/11B33-01  Precautions: Falls Risk, Weight bearing      Weight Bearing status: Weight bearing as tolerated on right lower extremity   Assessment: Pt demonstrated excellent progress with functional mobility this session. She progressed to completing transfers and ambulation with stand by assistance, as well as negotiated 1 threshold step with contact guard assistance. Of note, she remained limited with R knee flexion due to pain, however was encouraged to participate in knee flexion ROM exercises at home to avoid residual deficits with future mobility. Pt verbalized high confidence with home level mobility, and husband who was present throughout session endorsed ability to assist as needed at home. She maintained safe behaviors throughout all activities performed, thus from a PT perspective she is cleared for d/c home with HHPT and husband's assistance.  The patient will continue to benefit from the skills of a home health physical therapist to address Impaired motor control, Decreased balance, Impaired functional mobility, Decreased strength, Decreased endurance/activity tolerance, Decreased ROM/flexibility, Gait/Ambulation limitations, Pain.    Recommendations for mobility with nursing:  Use BMAT score and associated clinical judgement to determine safe mobility on a daily basis as patient status may be subject to change.  Pt safe to ambulate with use of RW and stand by assistance from nursing x1.   Discharge Recommendations: Is the patient safe to discharge to the recommended disposition? Yes Discharge Recommendations: Home, Home Health PT DME Recommendations    Flowsheet Row Most Recent Value  DME Recommendations None Filed at 07/30/2024 1240    Complete details of today's session: Chart reviewed and spoke  with RN prior to session to clear pt for therapy. Pt received supine in bedside recliner with husband present, agreeable to participate in session. She completed all functional mobility as detailed in flow sheet below. To conclude session, pt was educated on further safe home mobility instructions, anticipated progression with HHPT and finally clearance from therapy for d/c home. Pt verbalized understanding and agreement with no further questions at this time. At end of session, the patient was left semi-reclined in bed, with family present, with all needs in reach, with nurse call device in reach. Her status was communicated to the NP, Case Manager, RN, Patient, Family.     07/30/24 1240  Discipline Timestamp  Discipline Timestamp PT  Documentation Type     Documentation Type                                E,R, T   Treatment  Patient Subjective Information  Patient Subjective Information Patient agreeable to therapy  Precautions  Precautions Falls Risk;Weight bearing       Weight Bearing status WBAT RLE  Patient/Family Goals  Patient/Family Goals Return to previous lifestyle  Pain Assessment  Pain Assessment %% 0-10  Pain Score %% Two  Pain Type Surgical pain  Pain Loc LEG  Pain Orientation Right  Pain Descriptors Sore  Pain Frequency Intermittent  Pain Onset With activity  Non-Pharmacological Intervention(s) Active listening;Rest  Pain Intervention Response %% Decreased behavioral signs of pain;Patient satisfied with pain management  Multiple Pain Sites No  Change in Therapy? (Inpatient / HOD Only) No  Activity At Time Of Vitals Measurement  Activity Physical Exertion;Ambulation  Review of Systems Cardiovascular/Pulmonary Function  Any supplemental oxygen?  No  Review of Systems- Musculoskeletal  RLE Assessment X  LLE Assessment WFL  Mobility  Time spent sitting Received supine in bedside recliner  Bed Mobility  Sit to Supine;Sit to Stand;Stand to Sit       Sit to Supine  Assistance Minimal assist (pt. performs > 75%) (For RLE)       Sit to Supine Details Requires extra time;Set up;Verbal cues;Management of lines and tubes       Number of People Required 1       Sit to Stand Assistance Stand by assist       Sit to Stand Details Requires extra time;Set up;Verbal cues       Number of People Required 1       Sit to Stand Assistive Devices Walker       Walker type Rolling       Stand to [employer] by assist       Stand to Sit Details Requires extra time;Set up;Verbal cues       Number of People Required 1       Stand to Sit Assistive Devices Walker       Walker type [employer] Yes       Ambulation Assistance Stand by assist       Number of People Required 1  Distance ambulated in feet 20 feet (Within room)  Ambulation Assistive Device Walker       Walker type Rolling   Number of rest breaks 1 (For toileting)  Gait Pattern Antalgic, right;Shuffle;Decreased push off (Step-to leading with RLE, decreased cadence)  Gait Pattern Stance Phase Excessive knee flexion, right;Decreased stance time, right  Gait Pattern Swing Phase Decreased step length, bilateral  Stairs/Curb assessed Yes  Rails  None (comment)  Stairs Assistive Device Lobbyist type Geographical Information Systems Officer Assist (Hand Touch)  Comment/# Steps  1  Stair Pattern Step-to going up/Lead left foot;Step-to going down/Lead right foot (Backwards with RW)       Weight Bearing status WBAT RLE  Adult PT Outcomes  Highest Level of Activity 10-Walking gait aid (no assist)  Inpatient AM-PAC Performed Basic Mobility Inpatient Short Form - 6 Clicks  AM-PAC 6 Clicks Basic Mobility Inpatient Short Form  Turning from your back to your side while in a flat bed without using bedrails? 4-None  Moving from lying on your back to sitting on the side of a flat bed without using bedrails? 4-None  Moving to and from a bed to a chair (including a wheelchair)? 4-None  Standing  up from a chair using your arms (e.g,. wheelchair, or bedside chair)? 4-None  To walk in hospital room? 4-None  Climbing 3-5 steps with a railing? 3-A Little  AM-PAC Basic Mobility Raw Score 23  AM-PAC Basic Mobility t-Scale Score 50.88  AM-PAC Basic Mobility G-Code Modifier CI  Patient Status At End of Session  Status Communicated to: NP;Case Manager;RN;Patient;Family  Pt Left: semi-reclined in bed;with family present;with all needs in reach;with nurse call device in reach  Assessment  PT Enhancers Good family support/resources;Motivated;Has needed equipment;Age  PT Barriers Pain;Home environmental barriers;Low activity tolerance;Decreased activity tolerance/medically complex/comorbidities  Impairments/Functional Limitations    Impaired motor control;Decreased balance;Impaired functional mobility;Decreased strength;Decreased endurance/activity tolerance;Decreased ROM/flexibility;Gait/Ambulation limitations;Pain  Rehab Potential   Good  Patient safe for DC/PT perspective? Yes  Summary of Findings  Safe to return home;Notified case manager that patient is ready for discharge from PT perspective;Notified provider that  patient is ready for discharge from PT perspective  Plan  PT Frequency Patient discharged from PT  Discharge Recommendation (DUH/DRH) Home;Home Health PT  DME Recommendations None  Plan(Progress Note) Discontinue physical therapy    Please see patient education record for PT teaching completed today.   SCHUYLER FOLEY, PT [phone]  "

## 2024-07-30 NOTE — Progress Notes (Signed)
 " DAILY ROUNDING PROGRESS NOTE    Name:  Laura Bush Age: 73 y.o.  MRN: [id number] Sex: female   Room/Bed:  11B33/11B33-01 DOB: Jun 11, 1951   Admission Date:  07/26/2024  7:19 AM Attending Provider: Huston Recardo Morrison, MD   Time: 6:39 AM Hosp. Day #: 4    Laura Bush is a 73 y.o. female who is Hospital Day: 5 and 4 Days Post-Op status post Procedure(s): radical resection and DFR   SUBJECTIVE:  Interval History:  Laura Bush has no complaints and is resting comfortably. Reports adequate pain control. Epidural out.   No new onset numbness/tingling to operative extremity, chest pain, shortness of breath, fever, or chills. Drain: 80mL out past 24, 60mL out past 12  Current diet: Diet carbohydrate level 2 (60 gm/meal) Oral Supplements - Adult All Supplements; Ensure Plus High Protein-Assorted; With Meals Last BM: 07/28/24  OBJECTIVE:  Vital signs in last 24 hours:  Current Vital Signs 24h Vital Sign Ranges  T 37.2 C (99 F) (07/29/24 2340) Temp  Avg: 36.9 C (98.4 F)  Min: 36.7 C (98.1 F)  Max: 37.2 C (99 F)  BP 112/48 (07/29/24 2340) BP  Min: 106/55  Max: 112/48  HR 77 (07/29/24 2340) Pulse  Avg: 76  Min: 75  Max: 77  RR 15 (07/29/24 2340) Resp  Avg: 15  Min: 15  Max: 15  O2sat 100 %   SpO2  Avg: 98 %  Min: 97 %  Max: 100 %    Intake/Output Summary (Last 24 hours) at 07/30/2024 9360 Last data filed at 07/29/2024 1936 Gross per 24 hour  Intake 225.17 ml  Output 80 ml  Net 145.17 ml        Today's I&Os: 12/22 1901 - 12/23 0700 In: -  Out: 60 [Drains:60]  Current Diet: Diet carbohydrate level 2 (60 gm/meal) Oral Supplements - Adult All Supplements; Ensure Plus High Protein-Assorted; With Meals  Recent Labs  Lab 07/28/24 0350 07/29/24 0354  NA 134* 137  K 3.8 3.5  CL 103 105  CO2 24 27  BUN 14 9  CREATININE 1.0 0.9  GLUCOSE 109 97  CALCIUM  7.8* 7.6*   Recent Labs  Lab 07/28/24 0350 07/29/24 0354  WBC 6.5 4.9  HGB 9.0* 7.9*  HCT  26.6* 23.8*  PLT 132* 134*   Recent Labs  Lab 07/24/24 1500  APTT 29.9  INR 1.0     Microbiology: OR cultures sent 12/19 - pending.  Drain output:  Closed/Suction Drain Hemovac 1 Right;Anterior Thigh 10 Fr.-Output (mL): 60 mL Output by Drain (mL) 07/28/24 0701 - 07/28/24 1900 07/28/24 1901 - 07/29/24 0700 07/29/24 0701 - 07/29/24 1900 07/29/24 1901 - 07/30/24 0639  Closed/Suction Drain Hemovac 1 Right;Anterior Thigh 10 Fr.  70 20 60   Physical Exam: General: Alert, cooperative, and in NAD.  Right Lower Extremity: Dressing:  Aquacel left clean, dry, and intact. Minor strikethrough proximally Incision:  Not visualized. Drain:  Drain remains in place. Motor:  Tibialis anterior, gastrocnemius, and EHL & FHL muscles intact. Sensory:  Deep & superficial peroneal, sural, saphenous, and tibial intact. Vascular:  2+ distal pulses.  ASSESSMENT/PLAN:  Laura Bush 73 y.o. female is doing well and recovering as anticipated s/p radical resection and DFR with Dr. Huston on 12/19. AM labs pending. Remains asymptomatic - will continue to monitor Hb.   NAEON is resting comfortably. Reports adequate pain control. Epidural out.   No new onset numbness/tingling to operative extremity, chest pain, shortness of  breath, fever, or chills. Yet to pass PT. Labs pending, Euglycemic. Drain: 80mL out past 24, 60mL out past 12  Plan: Plan to mobilize with PT/OT and work towards clearance. Emphasize ROM immediately following surgery. Epidural out and lovenox  started. Voiding following TOV. Plan to maintain HV drain until output is less than 30 cc per shift.   PT/OT: Patient neurovascularly intact. WBAT. DIET: Carb 60.  PAIN CONTROL: PO medications with IV for breakthrough. DVT PPx: lovenox  40mg  qd ANTIBIOTICS: Perioperative antibiotics for 24 hours. Ancef  and Vanc perioperatively. Ancef  to continue while drain remains in place. DISPO:  Pending final treatment plan.  Follow up with Holley Peter  on 08/14/24.   Oneil Ply, MD PGY-2 Orthopaedic Surgery Resident For primary patients please page [phone].  For consult patients please page [phone].   *This note was dictated utilizing a voice recognition software. Please excuse occasional typographical errors. If questions occur, please do not hesitate to contact our team.*   "

## 2024-07-31 ENCOUNTER — Telehealth: Payer: Self-pay

## 2024-07-31 ENCOUNTER — Encounter: Payer: Self-pay | Admitting: Family Medicine

## 2024-07-31 DIAGNOSIS — M10079 Idiopathic gout, unspecified ankle and foot: Secondary | ICD-10-CM

## 2024-07-31 DIAGNOSIS — G4733 Obstructive sleep apnea (adult) (pediatric): Secondary | ICD-10-CM | POA: Diagnosis not present

## 2024-07-31 NOTE — Transitions of Care (Post Inpatient/ED Visit) (Signed)
 "  07/31/2024  Name: Laura Bush MRN: [id number] DOB: May 02, 1951  Today's TOC FU Call Status: Today's TOC FU Call Status:: Successful TOC FU Call Completed TOC FU Call Complete Date: 07/31/24  Patient's Name and Date of Birth confirmed. Name, DOB  Transition Care Management Follow-up Telephone Call Date of Discharge: 07/30/24 Discharge Facility: Other ([facility]) Name of Other ([facility]) Discharge Facility: [facility] Type of Discharge: Inpatient Admission Primary Inpatient Discharge Diagnosis:: Tumor Resection with DFR How have you been since you were released from the hospital?: Better Any questions or concerns?: No  Items Reviewed: Did you receive and understand the discharge instructions provided?: Yes Medications obtained,verified, and reconciled?: Yes (Medications Reviewed) Any new allergies since your discharge?: No Dietary orders reviewed?: NA Do you have support at home?: Yes People in Home [RPT]: spouse Name of Support/Comfort Primary Source: Randy Gauntt  Medications Reviewed Today: Medications Reviewed Today     Reviewed by Moises Reusing, RN (Case Manager) on 07/31/24 at 1453  Med List Status: <None>   Medication Order Taking? Sig Documenting Provider Last Dose Status Informant  acetaminophen  (TYLENOL ) 500 MG tablet [id number]  Take 1 tablet (500 mg total) by mouth every 8 (eight) hours as needed for mild pain or moderate pain. Deward Eck, PA-C  Active Self  AIRSUPRA  90-80 MCG/ACT AERO [id number]  INHALE 2 PUFFS BY MOUTH EVERY 6 HOURS AS NEEDED Frann Mabel Deward, DO  Active   aspirin EC 81 MG tablet [id number]  Take 81 mg by mouth in the morning. Swallow whole. [provider]  Active Self  B Complex-C (SUPER B COMPLEX PO) [id number]  Take 1 tablet by mouth daily with breakfast. [provider]  Active Self  Calcium  Carb-Cholecalciferol (CALCIUM  + D3 PO) [id number]  Take 1 tablet by mouth in the morning. [provider]  Active Self  cholecalciferol (VITAMIN D3) 25 MCG (1000 UNIT) tablet [id number]  Take 1,000 Units by mouth in the morning. [provider]  Active Self  COLLAGEN PO [id number]  Take 3 tablets by mouth in the morning and at bedtime. With Biotin  Patient taking differently: Take 2 tablets by mouth daily. With Biotin   [provider]  Active Self  Denosumab  (XGEVA  [place]) [id number]  Inject into the skin every 3 (three) months. [provider]  Active Self  enoxaparin  (LOVENOX ) 40 MG/0.4ML injection [id number] Yes Inject 40 mg into the skin daily. [provider]  Active   Evolocumab  (REPATHA  SURECLICK) 140 MG/ML SOAJ [id number]  Inject 140 mg into the skin every 14 (fourteen) days. Raford Riggs, MD  Active   fexofenadine (ALLEGRA) 180 MG tablet [id number]  Take 180 mg by mouth in the morning. [provider]  Active Self  folic acid  (FOLATE) 400 MCG tablet [id number]  Take 400 mcg by mouth in the morning. [provider]  Active Self  furosemide  (LASIX ) 20 MG tablet [id number]  TAKE 2 TABLETS BY MOUTH DAILY Ennever, Peter R, MD  Active   ibuprofen (ADVIL) 200 MG tablet [id number]  Take 600 mg by mouth daily as needed. [provider]  Active   ipratropium (ATROVENT ) 0.06 % nasal spray [id number]  Place 2 sprays into both nostrils 4 (four) times daily as needed for rhinitis. Copland, Harlene BROCKS, MD  Active   lorlatinib  (LORBRENA ) 100 MG tablet [id number]  Take 1 tablet (100 mg total) by mouth daily. Swallow tablets whole. Do not chew, crush or split tablets. Timmy Maude SAUNDERS, MD  Active   metFORMIN  (GLUCOPHAGE )  500 MG tablet [id number]  Take 1 tablet by mouth daily Copland, Harlene BROCKS, MD  Active   metolazone  (ZAROXOLYN ) 5 MG tablet [id number]  TAKE 1 TABLET BY MOUTH DAILY TAKE ABOUT 30-45 MINUTES PRIOR TO LASIX  EACH DAY Timmy Maude SAUNDERS, MD  Active   NIFEdipine  (PROCARDIA  XL/NIFEDICAL XL) 60 MG 24 hr tablet [id number]  TAKE 1 TABLET BY  MOUTH DAILY Raford Riggs, MD  Active   oxaprozin  (DAYPRO ) 600 MG tablet [id number]  Take 1 tablet (600 mg total) by mouth daily. Please take in the morning with breakfast. Timmy Maude SAUNDERS, MD  Active   oxyCODONE  (OXY IR/ROXICODONE ) 5 MG immediate release tablet [id number] Yes Take 5-10 mg by mouth every 4 (four) hours as needed. [provider]  Active   pantoprazole  (PROTONIX ) 40 MG tablet [id number]  Take 1 tablet (40 mg total) by mouth daily. Copland, Harlene BROCKS, MD  Active   polyethylene glycol powder (GLYCOLAX /MIRALAX ) 17 GM/SCOOP powder [id number] Yes Take 17 g by mouth daily. [provider]  Active   potassium chloride  SA (KLOR-CON  M) 20 MEQ tablet [id number]  TAKE 1 TABLET BY MOUTH 2 TIMES A DAY Ennever, Maude SAUNDERS, MD  Active   senna-docusate (SENOKOT-S) 8.6-50 MG tablet [id number] Yes Take 1 tablet by mouth at bedtime as needed. [provider]  Active   spironolactone  (ALDACTONE ) 25 MG tablet [id number]  TAKE 1 TABLET BY MOUTH DAILY Raford Riggs, MD  Active   tirzepatide  (MOUNJARO ) 7.5 MG/0.5ML Pen [id number]  Inject 7.5 mg into the skin once a week. Copland, Harlene BROCKS, MD  Active   traZODone  (DESYREL ) 50 MG tablet [id number]  TAKE 1 TABLET BY MOUTH EVERY NIGHT AT BEDTIME AS NEEDED FOR SLEEP Timmy Maude SAUNDERS, MD  Active   valsartan  (DIOVAN ) 80 MG tablet [id number]  TAKE 1 TABLET BY MOUTH DAILY Walker, Caitlin S, NP  Active   Med List Note Carmella Asberry [facility] 06/12/23 9075): Lorbrena  filled through [facility] and Equipment/Supplies: Were Home Health Services Ordered?: Yes Name of Home Health Agency:: [facility] Has Agency set up a time to come to your home?: Yes First Home Health Visit Date: 08/04/24 Any new equipment or medical supplies ordered?: No  Functional Questionnaire: Do you need assistance with bathing/showering or dressing?: Yes (Assistance of one) Do you need assistance with meal preparation?: Yes  (The spouse is providing assistance) Do you need assistance with eating?: No Do you have difficulty maintaining continence: No Do you need assistance with getting out of bed/getting out of a chair/moving?: Yes (Assistance of one) Do you have difficulty managing or taking your medications?: Yes (Her spouse is handling her medications)  Follow up appointments reviewed: PCP Follow-up appointment confirmed?: Yes Date of PCP follow-up appointment?: 08/15/24 Follow-up Provider: Dr. Watt Specialist [facility] Follow-up appointment confirmed?: Yes Date of Specialist follow-up appointment?: 08/14/24 Follow-Up Specialty Provider:: Dr. Timmy Do you need transportation to your follow-up appointment?: No Do you understand care options if your condition(s) worsen?: Yes-patient verbalized understanding  SDOH Interventions Today    Flowsheet Row Most Recent Value  SDOH Interventions   Food Insecurity Interventions Intervention Not Indicated  Housing Interventions Intervention Not Indicated  Transportation Interventions Intervention Not Indicated  Utilities Interventions Intervention Not Indicated    Medford Balboa, BSN, RN [place]  VBCI - Population Health RN Care Manager [phone]  "

## 2024-08-04 DIAGNOSIS — M10079 Idiopathic gout, unspecified ankle and foot: Secondary | ICD-10-CM | POA: Diagnosis not present

## 2024-08-05 ENCOUNTER — Other Ambulatory Visit: Payer: Self-pay

## 2024-08-05 ENCOUNTER — Other Ambulatory Visit (HOSPITAL_COMMUNITY): Payer: Self-pay

## 2024-08-05 MED ORDER — COLCHICINE 0.6 MG PO TABS: ORAL_TABLET | ORAL | 0 refills | Status: AC

## 2024-08-05 NOTE — Addendum Note (Signed)
 Addended by: WATT RAISIN C on: 08/05/2024 09:12 AM   Modules accepted: Orders

## 2024-08-05 NOTE — Telephone Encounter (Signed)
Please see the MyChart message reply(ies) for my assessment and plan.  The patient gave consent for this Medical Advice Message and is aware that it may result in a bill to their insurance company as well as the possibility that this may result in a co-payment or deductible. They are an established patient, but are not seeking medical advice exclusively about a problem treated during an in person or video visit in the last 7 days. I did not recommend an in person or video visit within 7 days of my reply.  I spent a total of7 minutes cumulative time within 7 days through MyChart messaging Sheily Lineman, MD  

## 2024-08-09 ENCOUNTER — Telehealth: Payer: Self-pay

## 2024-08-09 ENCOUNTER — Other Ambulatory Visit (HOSPITAL_COMMUNITY): Payer: Self-pay

## 2024-08-09 ENCOUNTER — Encounter: Payer: Self-pay | Admitting: Hematology & Oncology

## 2024-08-09 NOTE — Telephone Encounter (Signed)
 Oral Oncology Patient Advocate Encounter  Was successful in securing patient a $6000 grant from [facility] to provide copayment coverage for Lorbrena .  This will keep the out of pocket expense at $0.     [employer] ID: [id number]  Pending: Diagnosis Verification The billing information is as follows and has been shared with [facility].    RxBin: [id number] PCN: [id number] Member ID: [id number] Group ID: [id number] Dates of Eligibility: 07/10/2024 through 07/09/2025  Fund:  Non-Small Cell Lung Cancer - Medicare Access   [name],  CPhT-Adv  she/her/hers [facility]  [facility] Pharmacy Technician Patient Advocate Specialist III [facility] Phone: [phone]  Fax: [phone] [email or web address]

## 2024-08-13 ENCOUNTER — Encounter: Payer: Self-pay | Admitting: Pharmacist

## 2024-08-13 ENCOUNTER — Ambulatory Visit: Attending: Oncology

## 2024-08-13 ENCOUNTER — Other Ambulatory Visit: Payer: Self-pay

## 2024-08-13 DIAGNOSIS — M6281 Muscle weakness (generalized): Secondary | ICD-10-CM | POA: Diagnosis present

## 2024-08-13 DIAGNOSIS — R262 Difficulty in walking, not elsewhere classified: Secondary | ICD-10-CM | POA: Diagnosis present

## 2024-08-13 DIAGNOSIS — M25561 Pain in right knee: Secondary | ICD-10-CM | POA: Diagnosis present

## 2024-08-13 DIAGNOSIS — M25661 Stiffness of right knee, not elsewhere classified: Secondary | ICD-10-CM | POA: Diagnosis present

## 2024-08-13 NOTE — Progress Notes (Signed)
 Pharmacy Quality Measure Review  This patient is appearing on the insurance-providing list for being at risk of failing the adherence measure for Statin Therapy for Patients with Cardiovascular Disease [facility]) medications this calendar year.   Lab Results  Component Value Date   CHOL 211 (H) 11/07/2023   HDL 66 11/07/2023   LDLCALC 119 (H) 11/07/2023   TRIG 148 11/07/2023   CHOLHDL 3.2 11/07/2023    Has tried low dose statin in past - took pravastatin  20mg  3 times week but did not get LDL to goal.  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   No intervention at this time - oncology diagnosis might exclude her from [facility] measure in 2026 - will continue to monitor.   Madelin Ray, PharmD Clinical Pharmacist [facility] Primary Care  Population Health [phone]

## 2024-08-13 NOTE — Progress Notes (Signed)
 " OUTPATIENT PHYSICAL THERAPY LOWER EXTREMITY EVALUATION   Patient Name: Laura Bush MRN: [id number] DOB:01-15-1951, 74 y.o., female Today's Date: 08/14/2024  END OF SESSION:  PT End of Session - 08/13/24 1116     Visit Number 1    Date for Recertification  11/05/24    Progress Note Due on Visit 10    PT Start Time 1018    PT Stop Time 1110    PT Time Calculation (min) 52 min    Activity Tolerance Patient limited by fatigue;Patient limited by pain;Patient tolerated treatment well    Behavior During Therapy [facility] for tasks assessed/performed          Past Medical History:  Diagnosis Date   Allergy    seasonal   Anemia associated with stage 3 chronic renal failure (HCC) 04/18/2023   Arthritis Last several years   Mostly in hands, wrists, shoulders, knees   Asthma    in cold weather   Breast cancer (HCC) 09/18/2014   left breast   Breast cancer (HCC) 10/07/2014   bx left breast   Breast cancer of upper-outer quadrant of left female breast (HCC) 09/22/2014   Cluster headaches    in her 40's   GERD (gastroesophageal reflux disease)    Goals of care, counseling/discussion 01/14/2022   Heart murmur    as a child (has outgrown)   High cholesterol    History of radiation therapy    Left lung, Right Femur- 02/16/22-03/04/22- Dr. Lynwood Nasuti   History of radiation therapy    Right Pelvis-12/20/23-01/03/24-Dr. Lynwood Nasuti   History of radiation therapy    Right femur-05/02/24-05/17/24- Dr. 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   Stroke [facility]) summer 2022   described as tiny   Swallowing difficulty    Venous insufficiency    Past Surgical History:  Procedure Laterality Date   ABDOMINAL HYSTERECTOMY  1999   APPENDECTOMY  ~ 2006   BONE BIOPSY Right 01/04/2022   Procedure: RIGHT FEMORAL BONE BIOPSY;  Surgeon: Celena Sharper, MD;  Location: [facility] OR;  Service: Orthopedics;  Laterality: Right;   BONE EXCISION Right 04/11/2023   Procedure: BONE EXCISION;  Surgeon: Celena Sharper, MD;  Location: [facility] OR;  Service: Orthopedics;  Laterality: Right;   BREAST BIOPSY Left 10/2014   BREAST LUMPECTOMY Left 2016   BREAST REDUCTION SURGERY Bilateral 11/11/2014   Procedure: Bilateral Breast Reduction;  Surgeon: Alm Sick, MD;  Location: [facility] OR;  Service: Plastics;  Laterality: Bilateral;   BRONCHIAL BIOPSY  01/06/2022   Procedure: BRONCHIAL BIOPSIES;  Surgeon: Shelah Lamar RAMAN, MD;  Location: [facility] ENDOSCOPY;  Service: Pulmonary;;   BRONCHIAL BRUSHINGS  01/06/2022   Procedure: BRONCHIAL BRUSHINGS;  Surgeon: Shelah Lamar RAMAN, MD;  Location: [facility] ENDOSCOPY;  Service: Pulmonary;;   BRONCHIAL NEEDLE ASPIRATION BIOPSY  01/06/2022   Procedure: BRONCHIAL NEEDLE ASPIRATION BIOPSIES;  Surgeon: Shelah Lamar RAMAN, MD;  Location: [facility] ENDOSCOPY;  Service:  Pulmonary;;   BRONCHIAL WASHINGS  01/06/2022   Procedure: BRONCHIAL WASHINGS;  Surgeon: Shelah Lamar RAMAN, MD;  Location: [facility] ENDOSCOPY;  Service: Pulmonary;;   CARPAL TUNNEL RELEASE Bilateral    2 surgeries on right, 1 on left   CESAREAN SECTION  1978; 1980   COLONOSCOPY     FEMUR IM NAIL Right 01/04/2022   Procedure: RIGHT FEMORAL INTRAMEDULLARY (IM) NAIL;  Surgeon: Celena Sharper, MD;  Location: [facility] OR;  Service: Orthopedics;  Laterality: Right;   FIDUCIAL MARKER PLACEMENT  01/06/2022   Procedure: FIDUCIAL MARKER PLACEMENT;  Surgeon: Shelah Lamar RAMAN, MD;  Location: [facility] ENDOSCOPY;  Service: Pulmonary;;   FOREARM FRACTURE SURGERY Right 2003 X 3   FRACTURE SURGERY   2003, 2023   3 surgeries, right arm -03; Femur-23   HARDWARE REMOVAL Right 04/11/2023   Procedure: HARDWARE REMOVAL;  Surgeon: Celena Sharper, MD;  Location: [facility] OR;  Service: Orthopedics;  Laterality: Right;   KNEE ARTHROSCOPY Bilateral    meniscus repair   PARTIAL MASTECTOMY WITH NEEDLE LOCALIZATION AND AXILLARY SENTINEL LYMPH NODE BX Left 11/11/2014   Procedure: LEFT BREAST PARTIAL MASTECTOMY WITH NEEDLE LOCALIZATION TIMES TWO AND LEFT AXILLARY SENTINEL LYMPH NODE Biopsy;  Surgeon: Elon Pacini, MD;  Location: [facility] OR;  Service: General;  Laterality: Left;   REDUCTION MAMMAPLASTY Bilateral 2016   SQUAMOUS CELL CARCINOMA EXCISION  1980's X 1   nose   TONSILLECTOMY  ~ 1957   TUBAL LIGATION  ?1984   Patient Active Problem List   Diagnosis Date Noted   Metastasis to bone (HCC) 02/05/2024   Facial pain 10/23/2023   Anemia associated with stage 3 chronic renal failure (HCC) 04/18/2023   Lower extremity edema 12/21/2022   Insulin  resistance 09/13/2022   Motion sickness 01/20/2022   Visit for suture removal 01/20/2022   HLD (hyperlipidemia) 01/20/2022   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 Denver 04/21/2020   Osteopenia 10/02/2018   Malignant 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, estrogen receptor positive (HCC) 05/29/2017   Ductal carcinoma in situ (DCIS) of right breast  05/29/2017   Vitamin D  deficiency 01/29/2016   Eczema 12/29/2014   Venous insufficiency 10/16/2014   Primary hypertension 10/01/2014   Hypercholesteremia 10/01/2014   GERD (gastroesophageal reflux disease) 10/01/2014   Obesity 10/23/2013   Obstructive sleep apnea syndrome 07/19/2013   Nodule of finger 12/24/2009   Other bilateral bundle branch block 06/23/2003    PCP: Watt Raisin MD  REFERRING PROVIDER: Timmy Maude SAUNDERS MD  REFERRING DIAG: s/p hing TKA and distal femur replacement secondary to metastasis and OA R knee  THERAPY DIAG:  Difficulty in walking, not elsewhere classified  Acute pain of right knee  Stiffness of right knee, not elsewhere classified  Muscle weakness (generalized)  Rationale for Evaluation and Treatment: Rehabilitation  ONSET DATE: 07/26/24  SUBJECTIVE:   SUBJECTIVE STATEMENT: Resting pain 3/10, completed home health PT yesterday, is managing daily tasks at home  well . Using walker to get around at home.  Brought wheelchair to today's appt   PERTINENT HISTORY: Extensive history with R LE orthopedics , pathologic R prox femur fx 3 years ago, has lung Ca with METS to R femur , has undergone radiation R femur, had this surgery 07/26/24.   PAIN:  Are you having pain? Yes: NPRS scale: 3 to 9 Pain location: R lateral thigh, infrapatellar  , medial knee Pain description: deep  Aggravating factors: movement Relieving factors: icing  PRECAUTIONS: Other: WBAT R LE, fall risk  RED FLAGS: Lung ca with METS to R femur   WEIGHT BEARING RESTRICTIONS: No  FALLS:  Has patient fallen in last 6 months? Did not ask  LIVING ENVIRONMENT: Lives with: lives with their spouse Lives in: House/apartment Stairs: No Has following equipment at home: Vannie - 2 wheeled, Environmental Consultant - 4 wheeled, Wheelchair (manual), shower chair, and Grab bars  OCCUPATION: retired  PLOF: Independent with basic ADLs and Independent with household mobility with device  PATIENT  GOALS: not reliant on wheelchair  NEXT MD VISIT: 08/14/24  OBJECTIVE:  Note: Objective measures were completed at Evaluation unless otherwise noted.  DIAGNOSTIC FINDINGS: husband provided with pics on his phone of x rays, has long rod entire length of femur, hingesd  PATIENT SURVEYS:  PSFS: THE PATIENT SPECIFIC FUNCTIONAL SCALE  Place score of 0-10 (0 = unable to perform activity and 10 = able to perform activity at the same level as before injury or problem)  Activity Date: 08/13/24    Car transfers  4    2.bed mobility 5    3.walking greater than 350' for household distance  2    4.      Total Score 11/3: 3.67      Total Score = Sum of activity scores/number of activities  Minimally Detectable Change: 3 points (for single activity); 2 points (for average score)  [facility] (nd). The Patient Specific Functional Scale . Retrieved from [email or web address]   COGNITION: Overall cognitive status: Within functional limits for tasks assessed     SENSATION: WFL  EDEMA:  Mod edema B lower legs, R thigh and knee greater than L  MUSCLE LENGTH: Hamstrings: Right wnl deg; Left wnl deg Debby test: Right nt deg; Left nt deg  POSTURE: mod edema B Le, more pronounced R compared to L, in standing tends to ER R hip PALPATION: Has normal R patellar mobility, some tenderness  LOWER EXTREMITY ROM:   PROM Right eval Left eval  Hip flexion    Hip extension    Hip abduction    Hip adduction    Hip internal rotation    Hip external rotation    Knee flexion 42   Knee extension Hyperextends 3 degrees   Ankle dorsiflexion    Ankle plantarflexion    Ankle inversion    Ankle eversion     (Blank rows = not tested)  LOWER EXTREMITY MMT:  MMT Right eval Left eval  Hip flexion 3-   Hip extension    Hip abduction 2+   Hip adduction 2+   Hip internal rotation    Hip external rotation    Knee flexion    Knee  extension trace   Ankle dorsiflexion    Ankle plantarflexion 3+   Ankle inversion    Ankle eversion     (Blank rows = not tested)  LOWER EXTREMITY SPECIAL TESTS:  na  FUNCTIONAL TESTS:  Timed up and go (TUG): TBD  GAIT: Distance walked: 4' Assistive device utilized: FWW Level of assistance: SBA Comments: step through gait pattern, does stop walker briefly to advance L LE , utilizes good heel/ toe pattern                                                                                                                                TREATMENT DATE: 08/13/24  Evaluation, education throughout regarding goals Reviewed her current home program, she was able to replicate, includes seated heel slides with yoga strap,seated long arc quads with yoga strap , allowing R knee to bend and gravity assisted R knee flexion stretch. Supine glut sets, supine hip abd/ add with yoga strap,quad sets  Attempted short arc quads with pillow rolled under knee, with yoga strap assist, very painful, performed about 6 reps Also education on floating R heel with pillow due to some heel pain/ pressure overnight Adjuncted her current home program with new ex including standing heel/toe rocks, also standing 3 way hip, pushing into floor with foot on washcloth to engage proprioception and some approximation through jts     PATIENT EDUCATION:  Education details: POC, goals Person educated: Patient and Spouse Education method: Explanation, Demonstration, Tactile cues, and Verbal cues Education comprehension: verbalized understanding  HOME EXERCISE PROGRAM: Access Code: [email or web address] URL: [email or web address] Date: 08/13/2024 Prepared by: Greig Teresa Lemmerman  Exercises - Standing 3-way Hip with Walker  - 1 x daily - 7 x weekly - 2 sets - 10 reps - Standing Ankle Dorsiflexion with Table Support  - 1 x daily - 7 x weekly - 2 sets - 10 reps - Supine Short Arc Quad  - 1 x daily - 7 x weekly - 2 sets - 10  reps  ASSESSMENT:  CLINICAL IMPRESSION: Patient is a 73 y.o. 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   OBJECTIVE IMPAIRMENTS: decreased activity tolerance, decreased balance, decreased endurance, decreased mobility, difficulty walking, decreased ROM, decreased strength, hypomobility, increased edema, impaired perceived functional ability, impaired flexibility, improper body mechanics, postural dysfunction, and pain.   ACTIVITY LIMITATIONS: carrying, lifting, bending, standing, squatting, sleeping, stairs, transfers, bed mobility, bathing, dressing, and locomotion level  PARTICIPATION LIMITATIONS: meal prep, cleaning, laundry, community activity, yard work, and church  PERSONAL FACTORS: Age, Past/current experiences, Time since onset of injury/illness/exacerbation, and 1-2 comorbidities: small cell lung disease , previous pathological fracture R prox femur, are also affecting patient's functional outcome.   REHAB POTENTIAL: Good  CLINICAL DECISION MAKING: Evolving/moderate complexity  EVALUATION COMPLEXITY: Moderate   GOALS: Goals reviewed with patient? Yes  SHORT TERM GOALS: Target date: 4 weeks,09/11/23 I in HEP and progression Baseline:adapted today Goal status: INITIAL   LONG TERM GOALS: Target date: 12 weeks, 11/05/24  Improve PSFS score from 3.67 to 7 or greater Baseline:  Goal status: INITIAL  2.  Improve R knee flexion from 42 to 90 degrees for improved function for transfers, gait, mobility Baseline:  Goal status: INITIAL  3.  Improve R LE strength, hip and knee musculature to 3+/5 throughout, currently trace to 2+/5 Baseline:  Goal status: INITIAL  4.  Gait 350' at a time , without rest,with LAD for household ambulation Baseline:  Goal status: INITIAL   PLAN:  PT FREQUENCY: 2 x week x 1xweek,  then 3 x week x 2 weeks, then 2 x week weeks 4 to 12  PT DURATION: 12 weeks  PLANNED INTERVENTIONS: 97110-Therapeutic exercises, 97530- Therapeutic activity, [id number]- Neuromuscular re-education, 97535- Self Care, [id number]- Manual therapy, [id number]- Aquatic Therapy, [id number]- Electrical stimulation (manual), Patient/Family education, Balance training, Stair training, Taping, Cryotherapy, and Moist heat  PLAN FOR NEXT SESSION: review home exercise program again, gentle ROM R knee/ hip for flexion ROM, ex to engage LE musculature, trial of light lunges on low step, possible trial of Nustep.  Progress carefully, NMES may be contraindicated due to METS to femur, needs TUG when able   Refugio Mcconico L Tonnette Zwiebel, PT, DPT, OCS 08/14/2024, 8:30 AM  "

## 2024-08-13 NOTE — Progress Notes (Signed)
 [employer]er, [employer] [facility] [employer] [address], [address] [place], [place] [address] [phone]  Date:  08/15/2024   Name:  Laura Bush   DOB:  Jun 30, 1951   MRN:  [id number]  PCP:  Watt Harlene BROCKS, MD    Chief Complaint: Hospitalization Follow-up   History of Present Illness:  Laura Bush is a 74 y.o. very pleasant female patient who presents with the following:  Patient seen today to follow-up from recent admission at [facility].  She was admitted from December 19 through December 23 for orthopedic surgery.  She had resection of cancerous tumor of her right femur which involves removing the majority of the bone  History of Present Illness:  74 y.o. 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 She has significant femur and medial sided knee pain. It is possible that her IMN is moving around and causing pain in addition to her known metastatic disease in the femur and arthritis of the knee. The risks, benefits, and alternatives to surgery were clearly explained and they had a good understanding. She has agreed to removal of IMN and distal right femur replacement. The patient presented preoperatively for the above procedure. See admission H&P for full admission details.  Hospital Course:  The patient was a same-day admit on 07/26/2024. The patient underwent the above procedure by Visgauss, Recardo Morrison, MD. The patient had Epidural anesthesia with an estimated blood loss of . Intraoperatively, tissue was sent to microbiology and to pathology. . The patient did receive perioperative  antibiotics and DVT/PE prophylaxis. The foley was removed on POD#1 and the patient was able to void.  #HTN The patient's home antihypertensives were held in the immediate postoperative period to prevent hypotension. NiFEdipine  was continued with BP parameters to hold. Her BP remained low normal. Pt is advised to resume her Diuretics(lasix , metolazone ) after discharge one a time. Instructed to hold spironolactone  and Valsartan  for a week, check BP daily and if remains on low side discuss with primary care physician for further recommendations on resuming these medications #DM The patient's home DM regimen except Mounjaro  was continued postoperatively with the addition of sliding scale insulin  to prevent postoperative hyperglycemia.Blood sugar remained wnl. #Acute blood loss anemia Pt received 2 units PRBC intra op and 1 unit on 07/27/24 for Hb 6.2. She responded appropriately and Hb remained stable at the time of discharge. Surgical site was without bleeding or drainage. #Acute postoperative pain Pt had epidural catheter for post op pain management. Inpatient pain service (IPS) was following the patient. This was removed on POD 1 by IPS. Multimodal pain regimen was utilized. Narcotic pain medications were titrated up to lowest effective dose/frequency.  I saw her most recently in June History of hypertension, hyperlipidemia, her main issue now is stage IV lung cancer with mets to bone specifically her right femur   She had been getting her oncology care at [facility], however she was recently admitted at [facility] and then got a second opinion from [facility] oncology Her cancer was originally discovered with a pathologic fracture of her femur  Current Therapy:        Status post  right femur repair --01/04/2022 Xgeva  120 mg subcu q. 3 months  -next dose in 08/2024    S/P SBRT to LUL -- completed on 03/04/2022 S/P XRT to RIGHT femur -- completed on 03/04/2022 Alecensa  600 mg po BID -- start on 03/24/2022 --on  hold starting 04/04/2023 -restart 300 mg p.o. twice daily on 05/04/2023 --DC on 05/26/2023 Lorbrena  100 mg po q day  -- start on 06/14/2023 Aranesp  300 mcg subcu every 3 weeks Venofer -given on 07/10/2023  Discussed the use of AI scribe software for clinical note transcription with the patient, who gave verbal consent to proceed.  History of Present Illness Laura Bush is a 74 year old female who presents for follow-up after femur replacement surgery.  She underwent femur replacement surgery at [facility] just before Christmas, involving the replacement of a significant portion of the femur. She was hospitalized for a few days post-operatively and is currently using a walker for mobility. She has started formal physical therapy, with full sessions beginning tomorrow. Post-surgery, she experienced low blood pressure, leading to a temporary discontinuation of spironolactone  and valsartan . These medications have not been resumed, and her blood pressure remains well-controlled without them. She also describes a significantly low potassium level, necessitating an ER visit where she was advised to take potassium tablets instead of an infusion. She is currently taking potassium tablets twice daily.  20 mill equivalents twice daily  During the surgery, she lost a significant amount of blood and received three units in total. Her hemoglobin was 9.2 at the last check, having dropped to about 6 post-surgery. She is back on Mounjaro  at a dose of 7.5 mg. She is also taking furosemide  20 mg daily, with an option to take 40 mg if she needs it for increased swelling. She is not currently taking metolazone  or spironolactone .  Her pain is managed with Tylenol , and she is no longer taking oxycodone . No fever or flu-like symptoms have been noted since returning home. Some swelling post-surgery is gradually improving, and she uses ace bandages for compression, wearing them for a few hours a day.  Her appetite is  affected by Mounjaro , leading to smaller meal portions. She does not check her blood sugar at home, and her last A1c was 6.4. She experienced constipation, which resolved with a stool softener. Her weight recently decreased from 177 to 175 pounds, with a goal of reaching 155 pounds.  Wt Readings from Last 3 Encounters:  08/15/24 175 lb 9.6 oz (79.7 kg)  07/08/24 179 lb (81.2 kg)  06/17/24 (P) 180 lb 4 oz (81.8 kg)     Patient Active Problem List   Diagnosis Date Noted   Metastasis to bone (HCC) 02/05/2024   Facial pain 10/23/2023   Anemia associated with stage 3 chronic renal failure (HCC) 04/18/2023   Lower extremity edema 12/21/2022   Insulin  resistance 09/13/2022   Motion sickness 01/20/2022   Visit for suture removal 01/20/2022   HLD (hyperlipidemia) 01/20/2022   Non-small cell lung cancer metastatic to bone (HCC) 01/14/2022   Goals of care, counseling/discussion 01/14/2022   Laceration of left eyebrow 01/04/2022   Mass of lower lobe of left lung 01/04/2022   Aortic atherosclerosis 01/04/2022   CAD (coronary artery disease) 01/04/2022   Pathologic fracture of femur (HCC) 01/03/2022   OSA on CPAP 01/03/2022   Impaired fasting glucose, with polyphagia 12/15/2021   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 Denver 04/21/2020   Osteopenia 10/02/2018   Malignant 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, estrogen receptor positive (HCC) 05/29/2017   Ductal carcinoma in situ (DCIS) of right breast 05/29/2017   Vitamin D  deficiency 01/29/2016   Eczema 12/29/2014   Venous insufficiency 10/16/2014   Primary hypertension 10/01/2014   Hypercholesteremia 10/01/2014   GERD (gastroesophageal reflux disease) 10/01/2014   Obesity 10/23/2013   Obstructive sleep apnea syndrome 07/19/2013    Nodule of finger 12/24/2009   Other bilateral bundle branch block 06/23/2003    Past Medical History:  Diagnosis Date   Allergy    seasonal   Anemia associated with stage 3 chronic renal failure (HCC) 04/18/2023   Arthritis Last several years   Mostly in hands, wrists, shoulders, knees   Asthma    in cold weather   Breast cancer (HCC) 09/18/2014   left breast   Breast cancer (HCC) 10/07/2014   bx left breast   Breast cancer of upper-outer quadrant of left female breast (HCC) 09/22/2014   Cluster headaches    in her 40's   GERD (gastroesophageal reflux disease)    Goals of care, counseling/discussion 01/14/2022   Heart murmur    as a child (has outgrown)   High cholesterol    History of radiation therapy    Left lung, Right Femur- 02/16/22-03/04/22- Dr. Lynwood Nasuti   History of radiation therapy    Right Pelvis-12/20/23-01/03/24-Dr. Lynwood Nasuti   History of radiation therapy    Right femur-05/02/24-05/17/24- Dr. 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   Stroke [facility]) summer 2022   described as tiny   Swallowing difficulty    Venous insufficiency     Past Surgical History:  Procedure Laterality Date   ABDOMINAL HYSTERECTOMY  1999   APPENDECTOMY  ~ 2006   BONE BIOPSY Right 01/04/2022   Procedure: RIGHT FEMORAL BONE BIOPSY;  Surgeon: Celena Sharper, MD;  Location: [facility] OR;  Service: Orthopedics;  Laterality: Right;   BONE EXCISION Right 04/11/2023   Procedure: BONE EXCISION;  Surgeon: Celena Sharper, MD;   Location[facility];  Service: Orthopedics;  Laterality: Right;   BREAST BIOPSY Left 10/2014   BREAST LUMPECTOMY Left 2016   BREAST REDUCTION SURGERY Bilateral 11/11/2014   Procedure: Bilateral Breast Reduction;  Surgeon: Alm Sick, MD;  Location: [facility] OR;  Service: Plastics;  Laterality: Bilateral;   BRONCHIAL BIOPSY  01/06/2022   Procedure: BRONCHIAL BIOPSIES;  Surgeon: Shelah Lamar RAMAN, MD;  Location: [facility] ENDOSCOPY;  Service: Pulmonary;;   BRONCHIAL BRUSHINGS  01/06/2022   Procedure: BRONCHIAL BRUSHINGS;  Surgeon: Shelah Lamar RAMAN, MD;  Location: [facility] ENDOSCOPY;  Service: Pulmonary;;   BRONCHIAL NEEDLE ASPIRATION BIOPSY  01/06/2022   Procedure: BRONCHIAL NEEDLE ASPIRATION BIOPSIES;  Surgeon: Shelah Lamar RAMAN, MD;  Location: [facility] ENDOSCOPY;  Service: Pulmonary;;   BRONCHIAL WASHINGS  01/06/2022  Procedure: BRONCHIAL WASHINGS;  Surgeon: Shelah Lamar RAMAN, MD;  Location: [facility] ENDOSCOPY;  Service: Pulmonary;;   CARPAL TUNNEL RELEASE Bilateral    2 surgeries on right, 1 on left   CESAREAN SECTION  1978; 1980   COLONOSCOPY     FEMUR IM NAIL Right 01/04/2022   Procedure: RIGHT FEMORAL INTRAMEDULLARY (IM) NAIL;  Surgeon: Celena Sharper, MD;  Location: [facility] OR;  Service: Orthopedics;  Laterality: Right;   FIDUCIAL MARKER PLACEMENT  01/06/2022   Procedure: FIDUCIAL MARKER PLACEMENT;  Surgeon: Shelah Lamar RAMAN, MD;  Location: [facility] ENDOSCOPY;  Service: Pulmonary;;   FOREARM FRACTURE SURGERY Right 2003 X 3   FRACTURE SURGERY  2003, 2023   3 surgeries, right arm -03; Femur-23   HARDWARE REMOVAL Right 04/11/2023   Procedure: HARDWARE REMOVAL;  Surgeon: Celena Sharper, MD;  Location: [facility] OR;  Service: Orthopedics;  Laterality: Right;   KNEE ARTHROSCOPY Bilateral    meniscus repair   PARTIAL MASTECTOMY WITH NEEDLE LOCALIZATION AND AXILLARY SENTINEL LYMPH NODE BX Left 11/11/2014   Procedure: LEFT BREAST PARTIAL MASTECTOMY WITH NEEDLE LOCALIZATION TIMES TWO AND LEFT AXILLARY SENTINEL LYMPH NODE Biopsy;  Surgeon: Elon Pacini, MD;  Location: [facility] OR;  Service: General;  Laterality: Left;   REDUCTION MAMMAPLASTY Bilateral 2016   SQUAMOUS CELL CARCINOMA EXCISION  1980's X 1   nose   TONSILLECTOMY  ~ 1957   TUBAL LIGATION  ?1984    Social History[1]  Family History  Problem Relation Age of Onset   Dementia Mother        Lives in [place]   Hypertension Mother    Hyperlipidemia Mother    Stroke Father        deceased   Heart failure Father 52   Hypertension Father    Uterine cancer Sister 80   Heart attack Maternal Grandmother    Hypertension Maternal Grandfather    Stroke Paternal Grandfather    Colon cancer Neg Hx    Esophageal cancer Neg Hx    Pancreatic cancer Neg Hx    Stomach cancer Neg Hx    Liver disease Neg Hx    Sleep apnea Neg Hx     Allergies[2]  Medication list has been reviewed and updated.  Medications Ordered Prior to Encounter[3]  Review of Systems:  As per HPI- otherwise negative.  BP Readings from Last 3 Encounters:  08/15/24 128/74  07/08/24 119/60  06/17/24 (!) (P) 135/58   Physical Examination: Vitals:   08/15/24 1317  BP: 128/74  Pulse: 91  SpO2: 99%   Vitals:   08/15/24 1317  Weight: 175 lb 9.6 oz (79.7 kg)  Height: 4' 10.5 (1.486 m)   Body mass index is 36.08 kg/m. Ideal Body Weight: Weight in (lb) to have BMI = 25: 121.4  GEN: no acute distress.  Mildly overweight, looks well.  Accompanied today by her husband.  She is ambulating with a walker.  In good spirits as per usual HEENT: Atraumatic, Normocephalic.  Ears and Nose: No external deformity. CV: RRR, No M/G/R. No JVD. No thrill. No extra heart sounds. PULM: CTA B, no wheezes, crackles, rhonchi. No retractions. No resp. distress. No accessory muscle use. EXTR: No c/c/e PSYCH: Normally interactive. Conversant.  Large incision right thigh appears to be healing nicely, no evidence of infection.  Steri-Strips in place.  She shows me photographs of her new femur hardware which are displayed  below         Lab Results  Component Value Date   HGBA1C 6.4  02/01/2024    Assessment and Plan: Hypokalemia - Plan: Basic metabolic panel with GFR  Prediabetes - Plan: Hemoglobin A1c  Hypercholesteremia - Plan: Lipid panel  Pathological fracture of right femur due to neoplastic disease, initial encounter [facility])  Assessment & Plan Stage IV ALK-positive lung adenocarcinoma with bone metastasis Post-operative recovery from femur surgery with some persistent lower leg swelling due to radiation therapy. No infection signs.  Anemia due to stage 3 chronic kidney disease and iron  deficiency Anemia secondary to chronic kidney disease and iron  deficiency. Hemoglobin improved post-transfusion. - Continue to monitor hemoglobin levels.  Hypokalemia Recent hypokalemia episode managed with potassium supplementation. Current level 4.2 mEq/L. - Checked potassium level today. - Will adjust potassium supplementation based on results.  Hopefully can decrease her to 20 mill equivalents once daily now that she is on less diuretic  Chronic lower extremity edema Chronic edema exacerbated by surgery and radiation. Managed with furosemide  and compression bandages. - Continue furosemide  20 mg daily. - Use compression bandages as needed. - Monitor for signs of skin breakdown or infection.  If these occur we may need to increase her diuretics again  Prediabetes A1c at 6.4%. Mounjaro  well-tolerated with weight loss from 177 lbs to 175 lbs. - Continue Mounjaro  7.5 mg. - Encouraged gradual weight loss with a goal of 155 lbs.  Morbid obesity Weight loss to 175 lbs with a goal of 155 lbs. Mounjaro  aiding weight management. - Continue Mounjaro  7.5 mg.  Hyperlipidemia Managed with Repatha . No recent statin use due to intolerance and interaction with Lupron. - Checked lipid panel today. - Continue Repatha  as prescribed.  Signed Harlene Schroeder, MD     [1]  Social History Tobacco Use    Smoking status: Never   Smokeless tobacco: Never  Vaping Use   Vaping status: Never Used  Substance Use Topics   Alcohol use: Yes    Alcohol/week: 2.0 standard drinks of alcohol    Types: 2 Glasses of wine per week    Comment: 2 glasses a week   Drug use: No  [2]  Allergies Allergen Reactions   Diclofenac  Hypertension and Other (See Comments)    Other Reaction(s): Hypertension   Amlodipine  Swelling and Other (See Comments)    Limbs swell, not the throat   Lanolin Other (See Comments)    Sneezing and watery eyes. Allergic to Wool.  Other Reaction(s): Other (See Comments)  Sneezing and watery eyes. Allergic to Wool.    Sneezing and watery eyes. Allergic to Wool.   Silicone Dermatitis and Other (See Comments)    Redness (Bandaids also)   Tape Other (See Comments)    Redness (Bandaids also)  [3]  Current Outpatient Medications on File Prior to Visit  Medication Sig Dispense Refill   acetaminophen  (TYLENOL ) 500 MG tablet Take 1 tablet (500 mg total) by mouth every 8 (eight) hours as needed for mild pain or moderate pain. 30 tablet 0   AIRSUPRA  90-80 MCG/ACT AERO INHALE 2 PUFFS BY MOUTH EVERY 6 HOURS AS NEEDED 10.7 g 1   aspirin EC 81 MG tablet Take 81 mg by mouth in the morning. Swallow whole.     B Complex-C (SUPER B COMPLEX PO) Take 1 tablet by mouth daily with breakfast.     Calcium  Carb-Cholecalciferol (CALCIUM  + D3 PO) Take 1 tablet by mouth in the morning.     cholecalciferol (VITAMIN D3) 25 MCG (1000 UNIT) tablet Take 1,000 Units by mouth in the morning.     colchicine  0.6 MG tablet  Take 1.2 mg once, then take 0.6 mg an hour later.  Hold the rest of the rx for later use 15 tablet 0   COLLAGEN PO Take 3 tablets by mouth in the morning and at bedtime. With Biotin (Patient taking differently: Take 2 tablets by mouth daily. With Biotin)     Denosumab  (XGEVA  [place]) Inject into the skin every 3 (three) months.     enoxaparin  (LOVENOX ) 40 MG/0.4ML injection Inject 40 mg into the skin  daily.     Evolocumab  (REPATHA  SURECLICK) 140 MG/ML SOAJ Inject 140 mg into the skin every 14 (fourteen) days. 6 mL 3   fexofenadine (ALLEGRA) 180 MG tablet Take 180 mg by mouth in the morning.     folic acid  (FOLATE) 400 MCG tablet Take 400 mcg by mouth in the morning.     furosemide  (LASIX ) 20 MG tablet TAKE 2 TABLETS BY MOUTH DAILY 180 tablet 3   ibuprofen (ADVIL) 200 MG tablet Take 600 mg by mouth daily as needed.     ipratropium (ATROVENT ) 0.06 % nasal spray Place 2 sprays into both nostrils 4 (four) times daily as needed for rhinitis. 15 mL 4   lorlatinib  (LORBRENA ) 100 MG tablet Take 1 tablet (100 mg total) by mouth daily. Swallow tablets whole. Do not chew, crush or split tablets. 30 tablet 0   metFORMIN  (GLUCOPHAGE ) 500 MG tablet Take 1 tablet by mouth daily 90 tablet 3   NIFEdipine  (PROCARDIA  XL/NIFEDICAL XL) 60 MG 24 hr tablet TAKE 1 TABLET BY MOUTH DAILY 90 tablet 3   pantoprazole  (PROTONIX ) 40 MG tablet Take 1 tablet (40 mg total) by mouth daily. 90 tablet 0   potassium chloride  SA (KLOR-CON  M) 20 MEQ tablet TAKE 1 TABLET BY MOUTH 2 TIMES A DAY 60 tablet 5   senna-docusate (SENOKOT-S) 8.6-50 MG tablet Take 1 tablet by mouth at bedtime as needed.     spironolactone  (ALDACTONE ) 25 MG tablet TAKE 1 TABLET BY MOUTH DAILY 90 tablet 1   tirzepatide  (MOUNJARO ) 7.5 MG/0.5ML Pen Inject 7.5 mg into the skin once a week. 6 mL 1   traZODone  (DESYREL ) 50 MG tablet TAKE 1 TABLET BY MOUTH EVERY NIGHT AT BEDTIME AS NEEDED FOR SLEEP 90 tablet 2   valsartan  (DIOVAN ) 80 MG tablet TAKE 1 TABLET BY MOUTH DAILY 90 tablet 1   No current facility-administered medications on file prior to visit.   "

## 2024-08-15 ENCOUNTER — Ambulatory Visit (INDEPENDENT_AMBULATORY_CARE_PROVIDER_SITE_OTHER): Admitting: Family Medicine

## 2024-08-15 ENCOUNTER — Encounter: Payer: Self-pay | Admitting: Family Medicine

## 2024-08-15 VITALS — BP 128/74 | HR 91 | Ht 58.5 in | Wt 175.6 lb

## 2024-08-15 DIAGNOSIS — E876 Hypokalemia: Secondary | ICD-10-CM | POA: Diagnosis not present

## 2024-08-15 DIAGNOSIS — E7800 Pure hypercholesterolemia, unspecified: Secondary | ICD-10-CM

## 2024-08-15 DIAGNOSIS — R7303 Prediabetes: Secondary | ICD-10-CM | POA: Diagnosis not present

## 2024-08-15 DIAGNOSIS — M84551A Pathological fracture in neoplastic disease, right femur, initial encounter for fracture: Secondary | ICD-10-CM

## 2024-08-15 NOTE — Patient Instructions (Signed)
 Great to see you today!   For now let's continue furosemide  20 mg daily I will check your potassium; suspect we will be able to drop your potassium to 20 meq just once daily Your BP may well come back up as you get your strength back; can add back valsartan  when needed

## 2024-08-16 ENCOUNTER — Ambulatory Visit

## 2024-08-16 ENCOUNTER — Encounter: Payer: Self-pay | Admitting: Family Medicine

## 2024-08-16 DIAGNOSIS — R262 Difficulty in walking, not elsewhere classified: Secondary | ICD-10-CM

## 2024-08-16 DIAGNOSIS — M25561 Pain in right knee: Secondary | ICD-10-CM

## 2024-08-16 DIAGNOSIS — M25661 Stiffness of right knee, not elsewhere classified: Secondary | ICD-10-CM

## 2024-08-16 DIAGNOSIS — M6281 Muscle weakness (generalized): Secondary | ICD-10-CM

## 2024-08-16 LAB — LIPID PANEL
Cholesterol: 168 mg/dL (ref 28–200)
HDL: 57.9 mg/dL
LDL Cholesterol: 72 mg/dL (ref 10–99)
NonHDL: 109.69
Total CHOL/HDL Ratio: 3
Triglycerides: 189 mg/dL — ABNORMAL HIGH (ref 10.0–149.0)
VLDL: 37.8 mg/dL (ref 0.0–40.0)

## 2024-08-16 LAB — HEMOGLOBIN A1C: Hgb A1c MFr Bld: 5.3 % (ref 4.6–6.5)

## 2024-08-16 LAB — BASIC METABOLIC PANEL WITH GFR
BUN: 14 mg/dL (ref 6–23)
CO2: 26 meq/L (ref 19–32)
Calcium: 9.5 mg/dL (ref 8.4–10.5)
Chloride: 102 meq/L (ref 96–112)
Creatinine, Ser: 0.95 mg/dL (ref 0.40–1.20)
GFR: 59.48 mL/min — ABNORMAL LOW
Glucose, Bld: 92 mg/dL (ref 70–99)
Potassium: 4 meq/L (ref 3.5–5.1)
Sodium: 138 meq/L (ref 135–145)

## 2024-08-16 NOTE — Therapy (Signed)
 " OUTPATIENT PHYSICAL THERAPY LOWER EXTREMITY TREATMENT     Patient Name: Laura Bush MRN: [id number] DOB:[birth year], 74 y.o., female Today's Date: 08/14/2024   END OF SESSION:   PT End of Session - 08/13/24 1116       Visit Number 1     Date for Recertification  11/05/24     Progress Note Due on Visit 10     PT Start Time 0846     PT Stop Time 0930     PT Time Calculation (min) 44 min     Activity Tolerance Patient tolerated treatment well     Behavior During Therapy [facility] for tasks assessed/performed                  Past Medical History:  Diagnosis Date   Allergy      seasonal   Anemia associated with stage 3 chronic renal failure (HCC) 04/18/2023   Arthritis Last several years    Mostly in hands, wrists, shoulders, knees   Asthma      in cold weather   Breast cancer (HCC) 09/18/2014    left breast   Breast cancer (HCC) 10/07/2014    bx left breast   Breast cancer of upper-outer quadrant of left female breast (HCC) 09/22/2014   Cluster headaches      in her 40's   GERD (gastroesophageal reflux disease)     Goals of care, counseling/discussion 01/14/2022   Heart murmur      as a child (has outgrown)   High cholesterol     History of radiation therapy      Left lung, Right Femur- 02/16/22-03/04/22- Dr. Lynwood Nasuti   History of radiation therapy      Right Pelvis-12/20/23-01/03/24-Dr. Lynwood Nasuti   History of radiation therapy      Right femur-05/02/24-05/17/24- Dr. 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   Stroke [facility]) summer 2022    described as tiny   Swallowing difficulty     Venous insufficiency               Past Surgical History:  Procedure Laterality Date   ABDOMINAL HYSTERECTOMY   1999   APPENDECTOMY   ~ 2006   BONE BIOPSY Right 01/04/2022    Procedure: RIGHT FEMORAL BONE BIOPSY;  Surgeon: Celena Sharper, MD;  Location: [facility] OR;  Service: Orthopedics;  Laterality: Right;   BONE EXCISION Right 04/11/2023    Procedure: BONE EXCISION;  Surgeon: Celena Sharper, MD;  Location: [facility] OR;  Service: Orthopedics;  Laterality: Right;   BREAST BIOPSY Left 10/2014   BREAST LUMPECTOMY Left 2016   BREAST REDUCTION SURGERY Bilateral 11/11/2014    Procedure: Bilateral Breast Reduction;  Surgeon: Alm Sick, MD;  Location: [facility] OR;  Service: Plastics;  Laterality: Bilateral;   BRONCHIAL BIOPSY   01/06/2022    Procedure: BRONCHIAL BIOPSIES;  Surgeon: Shelah Lamar RAMAN, MD;  Location: [facility] ENDOSCOPY;  Service: Pulmonary;;   BRONCHIAL BRUSHINGS   01/06/2022    Procedure: BRONCHIAL BRUSHINGS;  Surgeon: Shelah Lamar RAMAN, MD;  Location: [facility] ENDOSCOPY;  Service: Pulmonary;;   BRONCHIAL NEEDLE ASPIRATION BIOPSY   01/06/2022    Procedure: BRONCHIAL NEEDLE ASPIRATION BIOPSIES;  Surgeon: Shelah Lamar RAMAN, MD;  Location: [facility] ENDOSCOPY;  Service: Pulmonary;;   BRONCHIAL WASHINGS   01/06/2022    Procedure: BRONCHIAL WASHINGS;  Surgeon: Shelah Lamar RAMAN, MD;  Location: [facility] ENDOSCOPY;  Service: Pulmonary;;   CARPAL TUNNEL RELEASE Bilateral      2 surgeries on right, 1 on left   CESAREAN SECTION   1978; 1980   COLONOSCOPY       FEMUR IM NAIL Right 01/04/2022    Procedure: RIGHT FEMORAL INTRAMEDULLARY (IM) NAIL;  Surgeon: Celena Sharper, MD;  Location: [facility] OR;  Service: Orthopedics;  Laterality: Right;   FIDUCIAL MARKER PLACEMENT   01/06/2022    Procedure: FIDUCIAL MARKER PLACEMENT;  Surgeon: Shelah Lamar RAMAN, MD;  Location: [facility] ENDOSCOPY;  Service: Pulmonary;;   FOREARM FRACTURE SURGERY Right 2003  X 3   FRACTURE SURGERY   2003, 2023    3 surgeries, right arm -03; Femur-23   HARDWARE REMOVAL Right 04/11/2023    Procedure: HARDWARE REMOVAL;  Surgeon: Celena Sharper, MD;  Location: [facility] OR;  Service: Orthopedics;  Laterality: Right;   KNEE ARTHROSCOPY Bilateral      meniscus repair   PARTIAL MASTECTOMY WITH NEEDLE LOCALIZATION AND AXILLARY SENTINEL LYMPH NODE BX Left 11/11/2014    Procedure: LEFT BREAST PARTIAL MASTECTOMY WITH NEEDLE LOCALIZATION TIMES TWO AND LEFT AXILLARY SENTINEL LYMPH NODE Biopsy;  Surgeon: Elon Pacini, MD;  Location: [facility] OR;  Service: General;  Laterality: Left;   REDUCTION MAMMAPLASTY Bilateral 2016   SQUAMOUS CELL CARCINOMA EXCISION   1980's X 1    nose   TONSILLECTOMY   ~ 1957   TUBAL LIGATION   ?1984            Patient Active Problem List    Diagnosis Date Noted   Metastasis to bone (HCC) 02/05/2024   Facial pain 10/23/2023   Anemia associated with stage 3 chronic renal failure (HCC) 04/18/2023   Lower extremity edema 12/21/2022   Insulin  resistance 09/13/2022   Motion sickness 01/20/2022   Visit for suture removal 01/20/2022   HLD (hyperlipidemia) 01/20/2022   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 Denver 04/21/2020   Osteopenia 10/02/2018   Malignant 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, estrogen receptor positive (HCC) 05/29/2017    Ductal carcinoma in situ (DCIS) of right breast 05/29/2017   Vitamin D  deficiency 01/29/2016   Eczema 12/29/2014   Venous insufficiency 10/16/2014   Primary hypertension 10/01/2014   Hypercholesteremia 10/01/2014   GERD (gastroesophageal reflux disease) 10/01/2014   Obesity 10/23/2013   Obstructive sleep apnea syndrome 07/19/2013   Nodule of finger 12/24/2009  Other bilateral bundle branch block 06/23/2003      PCP: Watt Raisin MD   REFERRING PROVIDER: Timmy Maude SAUNDERS MD   REFERRING DIAG: s/p hing TKA and distal femur replacement secondary to metastasis and OA R knee   THERAPY DIAG:  Difficulty in walking, not elsewhere classified   Acute pain of right knee   Stiffness of right knee, not elsewhere classified   Muscle weakness (generalized)   Rationale for Evaluation and Treatment: Rehabilitation   ONSET DATE: 07/26/24   SUBJECTIVE:    SUBJECTIVE STATEMENT: Pt reports a resting pain 5/10 with swelling and tightness in R knee. Pt is still using walker to get around. No wheelchair in clinic today.   EVAL:  Resting pain 3/10, completed home health PT yesterday, is managing daily tasks at home well . Using walker to get around at home.  Brought wheelchair to today's appt    PERTINENT HISTORY: Extensive history with R LE orthopedics , pathologic R prox femur fx 3 years ago, has lung Ca with METS to R femur , has undergone radiation R femur, had this surgery 07/26/24.   PAIN:  Are you having pain? Yes: NPRS scale: 3 to 9 Pain location: R lateral thigh, infrapatellar  , medial knee Pain description: deep  Aggravating factors: movement Relieving factors: icing   PRECAUTIONS: Other: WBAT R LE, fall risk   RED FLAGS: Lung ca with METS to R femur        WEIGHT BEARING RESTRICTIONS: No   FALLS:  Has patient fallen in last 6 months? Did not ask   LIVING ENVIRONMENT: Lives with: lives with their spouse Lives in: House/apartment Stairs: No Has following equipment  at home: Vannie - 2 wheeled, Environmental Consultant - 4 wheeled, Wheelchair (manual), shower chair, and Grab bars   OCCUPATION: retired   PLOF: Independent with basic ADLs and Independent with household mobility with device   PATIENT GOALS: not reliant on wheelchair   NEXT MD VISIT: 08/14/24   OBJECTIVE:  Note: Objective measures were completed at Evaluation unless otherwise noted.   DIAGNOSTIC FINDINGS: husband provided with pics on his phone of x rays, has long rod entire length of femur, hingesd   PATIENT SURVEYS:  PSFS: THE PATIENT SPECIFIC FUNCTIONAL SCALE   Place score of 0-10 (0 = unable to perform activity and 10 = able to perform activity at the same level as before injury or problem)   Activity Date: 08/13/24      Car transfers  4      2.bed mobility 5      3.walking greater than 350' for household distance  2      4.         Total Score 11/3: 3.67          Total Score = Sum of activity scores/number of activities   Minimally Detectable Change: 3 points (for single activity); 2 points (for average score)   [employer] (nd). The Patient Specific Functional Scale . Retrieved from [email or web address]    COGNITION: Overall cognitive status: Within functional limits for tasks assessed                         SENSATION: WFL   EDEMA:  Mod edema B lower legs, R thigh and knee greater than L   MUSCLE LENGTH: Hamstrings: Right wnl deg; Left wnl deg Debby test: Right nt deg; Left nt deg   POSTURE: mod edema B Le, more pronounced R  compared to L, in standing tends to ER R hip PALPATION: Has normal R patellar mobility, some tenderness   LOWER EXTREMITY ROM:    PROM Right eval Left eval  Hip flexion      Hip extension      Hip abduction      Hip adduction      Hip internal rotation      Hip external rotation      Knee flexion 42    Knee extension Hyperextends 3 degrees    Ankle dorsiflexion      Ankle  plantarflexion      Ankle inversion      Ankle eversion       (Blank rows = not tested)   LOWER EXTREMITY MMT:   MMT Right eval Left eval  Hip flexion 3-    Hip extension      Hip abduction 2+    Hip adduction 2+    Hip internal rotation      Hip external rotation      Knee flexion      Knee extension trace    Ankle dorsiflexion      Ankle plantarflexion 3+    Ankle inversion      Ankle eversion       (Blank rows = not tested)   LOWER EXTREMITY SPECIAL TESTS:  na   FUNCTIONAL TESTS:  Timed up and go (TUG): TBD   GAIT: Distance walked: 50' Assistive device utilized: FWW Level of assistance: SBA Comments: step through gait pattern, does stop walker briefly to advance L LE , utilizes good heel/ toe pattern                                                                                                                                  TREATMENT DATE: 08/16/24 THERAPEUTIC ACTIVITY:  NuStep L2 6 minutes (LE/UE) Standing 3-way hip abduction, ext, flex R LE holding counter 2x10 verbal cues to keep foot forward with abduction Standing marches R LE 2x10  Seated ankle pumps R LE on double stacked yoga blocks 1x20 Seated knee ext R LE 2x10 Seated heel slides R LE 2x20 Forward lunges on 2in step R LE 10x3'  BALANCE TRAINING The following exercises were performed in static standing with feet shoulder width apart: Eye closed and open  1 minute  EC standing Cervical rotation 5 reps each direction EC standing Cervical flexion and extension 5 reps each direction      08/13/24  Evaluation, education throughout regarding goals Reviewed her current home program, she was able to replicate, includes seated heel slides with yoga strap,seated long arc quads with yoga strap , allowing R knee to bend and gravity assisted R knee flexion stretch. Supine glut sets, supine hip abd/ add with yoga strap,quad sets   Attempted short arc quads with pillow rolled under knee, with yoga strap  assist, very painful, performed about 6 reps Also education on floating  R heel with pillow due to some heel pain/ pressure overnight Adjuncted her current home program with new ex including standing heel/toe rocks, also standing 3 way hip, pushing into floor with foot on washcloth to engage proprioception and some approximation through jts        PATIENT EDUCATION:  Education details: POC, goals Person educated: Patient and Spouse Education method: Explanation, Demonstration, Tactile cues, and Verbal cues Education comprehension: verbalized understanding   HOME EXERCISE PROGRAM: Access Code: [email or web address] URL: [email or web address] Date: 08/13/2024 Prepared by: Greig Speaks   Exercises - Standing 3-way Hip with Walker  - 1 x daily - 7 x weekly - 2 sets - 10 reps - Standing Ankle Dorsiflexion with Table Support  - 1 x daily - 7 x weekly - 2 sets - 10 reps - Supine Short Arc Quad  - 1 x daily - 7 x weekly - 2 sets - 10 reps   ASSESSMENT:   CLINICAL IMPRESSION: Pt is a 74 y.o female treated for R TKA and distal femur replacement. Therapeutic exercise was utilized  to improve functional mobility, balance, and reduce swelling to progress toward goals and recovery. Cuing provided throughout visit to correct form and technique. Some fatigue noted after the standing interventions. Pt showed improvement and is progressing well since evaluation. Continue with HEP and POC.   EVAL:  Patient is a 74 y.o. 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    OBJECTIVE IMPAIRMENTS: decreased activity tolerance, decreased balance, decreased endurance, decreased mobility, difficulty walking, decreased ROM, decreased strength, hypomobility, increased edema, impaired perceived functional ability, impaired flexibility, improper body mechanics,  postural dysfunction, and pain.    ACTIVITY LIMITATIONS: carrying, lifting, bending, standing, squatting, sleeping, stairs, transfers, bed mobility, bathing, dressing, and locomotion level   PARTICIPATION LIMITATIONS: meal prep, cleaning, laundry, community activity, yard work, and church   PERSONAL FACTORS: Age, Past/current experiences, Time since onset of injury/illness/exacerbation, and 1-2 comorbidities: small cell lung disease , previous pathological fracture R prox femur, are also affecting patient's functional outcome.    REHAB POTENTIAL: Good   CLINICAL DECISION MAKING: Evolving/moderate complexity   EVALUATION COMPLEXITY: Moderate     GOALS: Goals reviewed with patient? Yes   SHORT TERM GOALS: Target date: 4 weeks,09/11/23 I in HEP and progression Baseline:adapted today Goal status: Progressing- reviewed not attempted standing ex at home yet 08/16/24     LONG TERM GOALS: Target date: 12 weeks, 11/05/24   Improve PSFS score from 3.67 to 7 or greater Baseline:  Goal status: INITIAL   2.  Improve R knee flexion from 42 to 90 degrees for improved function for transfers, gait, mobility Baseline:  Goal status: INITIAL   3.  Improve R LE strength, hip and knee musculature to 3+/5 throughout, currently trace to 2+/5 Baseline:  Goal status: INITIAL   4.  Gait 350' at a time , without rest,with LAD for household ambulation Baseline:  Goal status: INITIAL     PLAN:   PT FREQUENCY: 2 x week x 1xweek, then 3 x week x 2 weeks, then 2 x week weeks 4 to 12   PT DURATION: 12 weeks   PLANNED INTERVENTIONS: 97110-Therapeutic exercises, 97530- Therapeutic activity, [id number]- Neuromuscular re-education, 97535- Self Care, [id number]- Manual therapy, [id number]- Aquatic Therapy, [phone]- Electrical stimulation (manual), Patient/Family education, Balance training, Stair training, Taping, Cryotherapy, and Moist heat   PLAN  FOR NEXT SESSION:  gentle ROM R knee/ hip for flexion ROM, ex to engage LE  musculature, trial of light lunges on low step, possible trial of Nustep.  Progress carefully, NMES may be contraindicated due to METS to femur, needs TUG when able     Braylin L Clark, PTA  08/16/2024, 10:30 am "

## 2024-08-19 ENCOUNTER — Ambulatory Visit

## 2024-08-19 DIAGNOSIS — R262 Difficulty in walking, not elsewhere classified: Secondary | ICD-10-CM

## 2024-08-19 DIAGNOSIS — M6281 Muscle weakness (generalized): Secondary | ICD-10-CM

## 2024-08-19 DIAGNOSIS — M25561 Pain in right knee: Secondary | ICD-10-CM

## 2024-08-19 DIAGNOSIS — M25661 Stiffness of right knee, not elsewhere classified: Secondary | ICD-10-CM

## 2024-08-19 NOTE — Therapy (Signed)
 " OUTPATIENT PHYSICAL THERAPY LOWER EXTREMITY TREATMENT     Patient Name: Laura Bush MRN: [id number] DOB:04/28/1951, 74 y.o., female Today's Date: 08/14/2024   END OF SESSION:    PT End of Session - 08/19/24 1156     Visit Number 3    Date for Recertification  11/05/24    Progress Note Due on Visit 10    PT Start Time 1148    PT Stop Time 1231    PT Time Calculation (min) 43 min    Activity Tolerance Patient tolerated treatment well    Behavior During Therapy [facility] for tasks assessed/performed                Past Medical History:  Diagnosis Date   Allergy      seasonal   Anemia associated with stage 3 chronic renal failure (HCC) 04/18/2023   Arthritis Last several years    Mostly in hands, wrists, shoulders, knees   Asthma      in cold weather   Breast cancer (HCC) 09/18/2014    left breast   Breast cancer (HCC) 10/07/2014    bx left breast   Breast cancer of upper-outer quadrant of left female breast (HCC) 09/22/2014   Cluster headaches      in her 40's   GERD (gastroesophageal reflux disease)     Goals of care, counseling/discussion 01/14/2022   Heart murmur      as a child (has outgrown)   High cholesterol     History of radiation therapy      Left lung, Right Femur- 02/16/22-03/04/22- Dr. Lynwood Nasuti   History of radiation therapy      Right Pelvis-12/20/23-01/03/24-Dr. Lynwood Nasuti   History of radiation therapy      Right femur-05/02/24-05/17/24- Dr. 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   Stroke [facility]) summer 2022    described as tiny   Swallowing difficulty     Venous insufficiency               Past Surgical History:  Procedure Laterality Date   ABDOMINAL HYSTERECTOMY   1999   APPENDECTOMY   ~ 2006   BONE BIOPSY Right 01/04/2022    Procedure: RIGHT FEMORAL BONE BIOPSY;  Surgeon: Celena Sharper, MD;  Location: [facility] OR;  Service: Orthopedics;  Laterality: Right;   BONE EXCISION Right 04/11/2023    Procedure: BONE EXCISION;  Surgeon: Celena Sharper, MD;  Location: [facility] OR;  Service: Orthopedics;  Laterality: Right;   BREAST BIOPSY Left 10/2014   BREAST LUMPECTOMY Left 2016   BREAST REDUCTION SURGERY Bilateral 11/11/2014    Procedure: Bilateral Breast Reduction;  Surgeon: Alm Sick, MD;  Location: [facility] OR;  Service: Plastics;  Laterality: Bilateral;   BRONCHIAL BIOPSY   01/06/2022    Procedure: BRONCHIAL BIOPSIES;  Surgeon: Shelah Lamar RAMAN, MD;  Location: [facility] ENDOSCOPY;  Service: Pulmonary;;   BRONCHIAL BRUSHINGS   01/06/2022    Procedure: BRONCHIAL BRUSHINGS;  Surgeon: Shelah Lamar RAMAN, MD;  Location: [facility] ENDOSCOPY;  Service: Pulmonary;;   BRONCHIAL NEEDLE ASPIRATION BIOPSY   01/06/2022    Procedure: BRONCHIAL NEEDLE ASPIRATION BIOPSIES;  Surgeon: Shelah Lamar RAMAN, MD;  Location: [facility] ENDOSCOPY;  Service: Pulmonary;;   BRONCHIAL WASHINGS   01/06/2022    Procedure: BRONCHIAL WASHINGS;  Surgeon: Shelah Lamar RAMAN, MD;  Location: [facility] ENDOSCOPY;  Service: Pulmonary;;   CARPAL TUNNEL RELEASE Bilateral      2 surgeries on right, 1 on left   CESAREAN SECTION   1978; 1980   COLONOSCOPY       FEMUR IM NAIL Right 01/04/2022    Procedure: RIGHT FEMORAL INTRAMEDULLARY (IM) NAIL;  Surgeon: Celena Sharper, MD;  Location: [facility] OR;  Service: Orthopedics;  Laterality: Right;   FIDUCIAL MARKER PLACEMENT   01/06/2022    Procedure: FIDUCIAL MARKER PLACEMENT;  Surgeon: Shelah Lamar RAMAN, MD;  Location: [facility] ENDOSCOPY;  Service: Pulmonary;;   FOREARM FRACTURE SURGERY Right 2003 X 3    FRACTURE SURGERY   2003, 2023    3 surgeries, right arm -03; Femur-23   HARDWARE REMOVAL Right 04/11/2023    Procedure: HARDWARE REMOVAL;  Surgeon: Celena Sharper, MD;  Location: [facility] OR;  Service: Orthopedics;  Laterality: Right;   KNEE ARTHROSCOPY Bilateral      meniscus repair   PARTIAL MASTECTOMY WITH NEEDLE LOCALIZATION AND AXILLARY SENTINEL LYMPH NODE BX Left 11/11/2014    Procedure: LEFT BREAST PARTIAL MASTECTOMY WITH NEEDLE LOCALIZATION TIMES TWO AND LEFT AXILLARY SENTINEL LYMPH NODE Biopsy;  Surgeon: Elon Pacini, MD;  Location: [facility] OR;  Service: General;  Laterality: Left;   REDUCTION MAMMAPLASTY Bilateral 2016   SQUAMOUS CELL CARCINOMA EXCISION   1980's X 1    nose   TONSILLECTOMY   ~ 1957   TUBAL LIGATION   ?1984            Patient Active Problem List    Diagnosis Date Noted   Metastasis to bone (HCC) 02/05/2024   Facial pain 10/23/2023   Anemia associated with stage 3 chronic renal failure (HCC) 04/18/2023   Lower extremity edema 12/21/2022   Insulin  resistance 09/13/2022   Motion sickness 01/20/2022   Visit for suture removal 01/20/2022   HLD (hyperlipidemia) 01/20/2022   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 Denver 04/21/2020   Osteopenia 10/02/2018   Malignant 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, estrogen receptor positive (HCC) 05/29/2017   Ductal  carcinoma in situ (DCIS) of right breast 05/29/2017   Vitamin D  deficiency 01/29/2016   Eczema 12/29/2014   Venous insufficiency 10/16/2014   Primary hypertension 10/01/2014   Hypercholesteremia 10/01/2014   GERD (gastroesophageal reflux disease) 10/01/2014   Obesity 10/23/2013   Obstructive sleep apnea syndrome 07/19/2013   Nodule of finger 12/24/2009   Other bilateral bundle branch block 06/23/2003  PCP: Watt Raisin MD   REFERRING PROVIDER: Timmy Maude SAUNDERS MD   REFERRING DIAG: s/p hing TKA and distal femur replacement secondary to metastasis and OA R knee   THERAPY DIAG:  Difficulty in walking, not elsewhere classified   Acute pain of right knee   Stiffness of right knee, not elsewhere classified   Muscle weakness (generalized)   Rationale for Evaluation and Treatment: Rehabilitation   ONSET DATE: 07/26/24   SUBJECTIVE:    SUBJECTIVE STATEMENT: Pt reports she was sore from last session, still a little bit today  EVAL:  Resting pain 3/10, completed home health PT yesterday, is managing daily tasks at home well . Using walker to get around at home.  Brought wheelchair to today's appt    PERTINENT HISTORY: Extensive history with R LE orthopedics , pathologic R prox femur fx 3 years ago, has lung Ca with METS to R femur , has undergone radiation R femur, had this surgery 07/26/24.   PAIN:  Are you having pain? Yes: NPRS scale: 5/10 Pain location: R lateral thigh, infrapatellar  , medial knee Pain description: deep  Aggravating factors: movement Relieving factors: icing   PRECAUTIONS: Other: WBAT R LE, fall risk   RED FLAGS: Lung ca with METS to R femur        WEIGHT BEARING RESTRICTIONS: No   FALLS:  Has patient fallen in last 6 months? Did not ask   LIVING ENVIRONMENT: Lives with: lives with their spouse Lives in: House/apartment Stairs: No Has following equipment at home: Vannie - 2 wheeled, Environmental Consultant - 4 wheeled, Wheelchair (manual), shower chair,  and Grab bars   OCCUPATION: retired   PLOF: Independent with basic ADLs and Independent with household mobility with device   PATIENT GOALS: not reliant on wheelchair   NEXT MD VISIT: 08/14/24   OBJECTIVE:  Note: Objective measures were completed at Evaluation unless otherwise noted.   DIAGNOSTIC FINDINGS: husband provided with pics on his phone of x rays, has long rod entire length of femur, hingesd   PATIENT SURVEYS:  PSFS: THE PATIENT SPECIFIC FUNCTIONAL SCALE   Place score of 0-10 (0 = unable to perform activity and 10 = able to perform activity at the same level as before injury or problem)   Activity Date: 08/13/24      Car transfers  4      2.bed mobility 5      3.walking greater than 350' for household distance  2      4.         Total Score 11/3: 3.67          Total Score = Sum of activity scores/number of activities   Minimally Detectable Change: 3 points (for single activity); 2 points (for average score)   [facility] (nd). The Patient Specific Functional Scale . Retrieved from [email or web address]    COGNITION: Overall cognitive status: Within functional limits for tasks assessed                         SENSATION: WFL   EDEMA:  Mod edema B lower legs, R thigh and knee greater than L   MUSCLE LENGTH: Hamstrings: Right wnl deg; Left wnl deg Debby test: Right nt deg; Left nt deg   POSTURE: mod edema B Le, more pronounced R compared to L, in standing tends to ER R hip PALPATION: Has normal R patellar mobility, some tenderness   LOWER EXTREMITY ROM:    PROM  Right eval Left eval R AROM 08/19/24  Hip flexion       Hip extension       Hip abduction       Hip adduction       Hip internal rotation       Hip external rotation       Knee flexion 42   60 in sitting  Knee extension Hyperextends 3 degrees     Ankle dorsiflexion       Ankle plantarflexion       Ankle inversion       Ankle  eversion        (Blank rows = not tested)   LOWER EXTREMITY MMT:   MMT Right eval Left eval  Hip flexion 3-    Hip extension      Hip abduction 2+    Hip adduction 2+    Hip internal rotation      Hip external rotation      Knee flexion      Knee extension trace    Ankle dorsiflexion      Ankle plantarflexion 3+    Ankle inversion      Ankle eversion       (Blank rows = not tested)   LOWER EXTREMITY SPECIAL TESTS:  na   FUNCTIONAL TESTS:  Timed up and go (TUG): TBD   GAIT: Distance walked: 50' Assistive device utilized: FWW Level of assistance: SBA Comments: step through gait pattern, does stop walker briefly to advance L LE , utilizes good heel/ toe pattern                                                                                                                                  TREATMENT DATE: 08/19/24 Bike Partial rev x Seated RLE LAQ 2x10 Supine heel slide with peanut ball x 8 Supine AP with bolster under LE x 20 Supine PF RTB  legs on bolster 2 x 10 BLE Supine DF RTB legs on bolster x 10 BLE Seated RLE heel slides on slider 2x10 Nustep L2x45min UE/LE  08/16/24 THERAPEUTIC ACTIVITY:  NuStep L2 6 minutes (LE/UE) Standing 3-way hip abduction, ext, flex R LE holding counter 2x10 verbal cues to keep foot forward with abduction Standing marches R LE 2x10  Seated ankle pumps R LE on double stacked yoga blocks 1x20 Seated knee ext R LE 2x10 Seated heel slides R LE 2x20 Forward lunges on 2in step R LE 10x3'  BALANCE TRAINING The following exercises were performed in static standing with feet shoulder width apart: Eye closed and open  1 minute  EC standing Cervical rotation 5 reps each direction EC standing Cervical flexion and extension 5 reps each direction      08/13/24  Evaluation, education throughout regarding goals Reviewed her current home program, she was able to replicate, includes seated heel slides with yoga strap,seated long arc quads  with yoga strap , allowing  R knee to bend and gravity assisted R knee flexion stretch. Supine glut sets, supine hip abd/ add with yoga strap,quad sets   Attempted short arc quads with pillow rolled under knee, with yoga strap assist, very painful, performed about 6 reps Also education on floating R heel with pillow due to some heel pain/ pressure overnight Adjuncted her current home program with new ex including standing heel/toe rocks, also standing 3 way hip, pushing into floor with foot on washcloth to engage proprioception and some approximation through jts        PATIENT EDUCATION:  Education details: POC, goals Person educated: Patient and Spouse Education method: Explanation, Demonstration, Tactile cues, and Verbal cues Education comprehension: verbalized understanding   HOME EXERCISE PROGRAM: Access Code: [email or web address] URL: [email or web address] Date: 08/13/2024 Prepared by: Greig Speaks   Exercises - Standing 3-way Hip with Walker  - 1 x daily - 7 x weekly - 2 sets - 10 reps - Standing Ankle Dorsiflexion with Table Support  - 1 x daily - 7 x weekly - 2 sets - 10 reps - Supine Short Arc Quad  - 1 x daily - 7 x weekly - 2 sets - 10 reps   ASSESSMENT:   CLINICAL IMPRESSION: Pt is a 74 y.o female treated for R TKA and distal femur replacement. Therapeutic exercise focusing on LE ROM today and light strengthening as we may have overworked last session. Knee flexion has improved to 60 degrees in sitting position. Pt responded well to treatment, will continue to progress as tolerated.  EVAL:  Patient is a 74 y.o. 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    OBJECTIVE IMPAIRMENTS: decreased activity tolerance, decreased balance, decreased endurance, decreased mobility, difficulty walking, decreased ROM, decreased  strength, hypomobility, increased edema, impaired perceived functional ability, impaired flexibility, improper body mechanics, postural dysfunction, and pain.    ACTIVITY LIMITATIONS: carrying, lifting, bending, standing, squatting, sleeping, stairs, transfers, bed mobility, bathing, dressing, and locomotion level   PARTICIPATION LIMITATIONS: meal prep, cleaning, laundry, community activity, yard work, and church   PERSONAL FACTORS: Age, Past/current experiences, Time since onset of injury/illness/exacerbation, and 1-2 comorbidities: small cell lung disease , previous pathological fracture R prox femur, are also affecting patient's functional outcome.    REHAB POTENTIAL: Good   CLINICAL DECISION MAKING: Evolving/moderate complexity   EVALUATION COMPLEXITY: Moderate     GOALS: Goals reviewed with patient? Yes   SHORT TERM GOALS: Target date: 4 weeks,09/11/23 I in HEP and progression Baseline:adapted today Goal status: met- 08/19/24     LONG TERM GOALS: Target date: 12 weeks, 11/05/24   Improve PSFS score from 3.67 to 7 or greater Baseline:  Goal status: INITIAL   2.  Improve R knee flexion from 42 to 90 degrees for improved function for transfers, gait, mobility Baseline:  Goal status: INITIAL   3.  Improve R LE strength, hip and knee musculature to 3+/5 throughout, currently trace to 2+/5 Baseline:  Goal status: INITIAL   4.  Gait 350' at a time , without rest,with LAD for household ambulation Baseline:  Goal status: INITIAL      PLAN:   PT FREQUENCY: 2 x week x 1xweek, then 3 x week x 2 weeks, then 2 x week weeks 4 to 12   PT DURATION: 12 weeks   PLANNED INTERVENTIONS: 97110-Therapeutic exercises, 97530- Therapeutic activity, [id number]- Neuromuscular  re-education, [phone]- Self Care, [id number]- Manual therapy, [phone]- Aquatic Therapy, [phone]- Electrical stimulation (manual), Patient/Family education, Balance training, Stair training, Taping, Cryotherapy, and Moist heat   PLAN FOR  NEXT SESSION:  gentle ROM R knee/ hip for flexion ROM, ex to engage LE musculature, trial of light lunges on low step, possible trial of Nustep.  Progress carefully, NMES may be contraindicated due to METS to femur, needs TUG when able      Laura Bush L Daemion Mcniel, PTA  08/16/2024, 10:30 am "

## 2024-08-20 NOTE — Therapy (Signed)
 " OUTPATIENT PHYSICAL THERAPY LOWER EXTREMITY TREATMENT     Patient Name: Laura Bush MRN: [id number] DOB:07-09-1951, 74 y.o., female Today's Date: 08/14/2024   END OF SESSION:            Past Medical History:  Diagnosis Date   Allergy      seasonal   Anemia associated with stage 3 chronic renal failure (HCC) 04/18/2023   Arthritis Last several years    Mostly in hands, wrists, shoulders, knees   Asthma      in cold weather   Breast cancer (HCC) 09/18/2014    left breast   Breast cancer (HCC) 10/07/2014    bx left breast   Breast cancer of upper-outer quadrant of left female breast (HCC) 09/22/2014   Cluster headaches      in her 40's   GERD (gastroesophageal reflux disease)     Goals of care, counseling/discussion 01/14/2022   Heart murmur      as a child (has outgrown)   High cholesterol     History of radiation therapy      Left lung, Right Femur- 02/16/22-03/04/22- Dr. Lynwood Nasuti   History of radiation therapy      Right Pelvis-12/20/23-01/03/24-Dr. Lynwood Nasuti   History of radiation therapy      Right femur-05/02/24-05/17/24- Dr. 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   Stroke [facility]) summer 2022    described as tiny   Swallowing difficulty     Venous insufficiency               Past Surgical History:  Procedure Laterality Date   ABDOMINAL HYSTERECTOMY   1999   APPENDECTOMY   ~ 2006   BONE BIOPSY Right 01/04/2022    Procedure: RIGHT FEMORAL BONE BIOPSY;  Surgeon:  Celena Sharper, MD;  Location: [facility] OR;  Service: Orthopedics;  Laterality: Right;   BONE EXCISION Right 04/11/2023    Procedure: BONE EXCISION;  Surgeon: Celena Sharper, MD;  Location: [facility] OR;  Service: Orthopedics;  Laterality: Right;   BREAST BIOPSY Left 10/2014   BREAST LUMPECTOMY Left 2016   BREAST REDUCTION SURGERY Bilateral 11/11/2014    Procedure: Bilateral Breast Reduction;  Surgeon: Alm Sick, MD;  Location: [facility] OR;  Service: Plastics;  Laterality: Bilateral;   BRONCHIAL BIOPSY   01/06/2022    Procedure: BRONCHIAL BIOPSIES;  Surgeon: Shelah Lamar RAMAN, MD;  Location: [facility] ENDOSCOPY;  Service: Pulmonary;;   BRONCHIAL BRUSHINGS   01/06/2022    Procedure: BRONCHIAL BRUSHINGS;  Surgeon: Shelah Lamar RAMAN, MD;  Location: [facility] ENDOSCOPY;  Service: Pulmonary;;   BRONCHIAL NEEDLE ASPIRATION BIOPSY   01/06/2022    Procedure: BRONCHIAL NEEDLE ASPIRATION BIOPSIES;  Surgeon: Shelah Lamar RAMAN, MD;  Location: [facility] ENDOSCOPY;  Service: Pulmonary;;   BRONCHIAL WASHINGS   01/06/2022    Procedure: BRONCHIAL WASHINGS;  Surgeon: Shelah Lamar RAMAN, MD;  Location:  [facility] ENDOSCOPY;  Service: Pulmonary;;   CARPAL TUNNEL RELEASE Bilateral      2 surgeries on right, 1 on left   CESAREAN SECTION   1978; 1980   COLONOSCOPY       FEMUR IM NAIL Right 01/04/2022    Procedure: RIGHT FEMORAL INTRAMEDULLARY (IM) NAIL;  Surgeon: Celena Sharper, MD;  Location: [facility] OR;  Service: Orthopedics;  Laterality: Right;   FIDUCIAL MARKER PLACEMENT   01/06/2022    Procedure: FIDUCIAL MARKER PLACEMENT;  Surgeon: Shelah Lamar RAMAN, MD;  Location: [facility] ENDOSCOPY;  Service: Pulmonary;;   FOREARM FRACTURE SURGERY Right 2003 X 3   FRACTURE SURGERY   2003, 2023    3 surgeries, right arm -03; Femur-23   HARDWARE REMOVAL Right 04/11/2023    Procedure: HARDWARE REMOVAL;  Surgeon: Celena Sharper, MD;  Location: [facility] OR;  Service: Orthopedics;  Laterality: Right;   KNEE ARTHROSCOPY Bilateral      meniscus repair   PARTIAL MASTECTOMY WITH NEEDLE LOCALIZATION AND  AXILLARY SENTINEL LYMPH NODE BX Left 11/11/2014    Procedure: LEFT BREAST PARTIAL MASTECTOMY WITH NEEDLE LOCALIZATION TIMES TWO AND LEFT AXILLARY SENTINEL LYMPH NODE Biopsy;  Surgeon: Elon Pacini, MD;  Location: [facility] OR;  Service: General;  Laterality: Left;   REDUCTION MAMMAPLASTY Bilateral 2016   SQUAMOUS CELL CARCINOMA EXCISION   1980's X 1    nose   TONSILLECTOMY   ~ 1957   TUBAL LIGATION   ?1984            Patient Active Problem List    Diagnosis Date Noted   Metastasis to bone (HCC) 02/05/2024   Facial pain 10/23/2023   Anemia associated with stage 3 chronic renal failure (HCC) 04/18/2023   Lower extremity edema 12/21/2022   Insulin  resistance 09/13/2022   Motion sickness 01/20/2022   Visit for suture removal 01/20/2022   HLD (hyperlipidemia) 01/20/2022   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 Denver 04/21/2020   Osteopenia 10/02/2018   Malignant 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, estrogen receptor positive (HCC) 05/29/2017   Ductal carcinoma in situ (DCIS) of right breast 05/29/2017   Vitamin D  deficiency 01/29/2016   Eczema 12/29/2014   Venous insufficiency 10/16/2014   Primary hypertension 10/01/2014   Hypercholesteremia 10/01/2014   GERD (gastroesophageal reflux disease) 10/01/2014   Obesity 10/23/2013   Obstructive sleep apnea syndrome 07/19/2013    Nodule of finger 12/24/2009   Other bilateral bundle branch block 06/23/2003      PCP: Watt Raisin MD   REFERRING PROVIDER: Timmy Maude SAUNDERS MD   REFERRING DIAG: s/p hing TKA and distal femur replacement secondary to metastasis and OA R knee   THERAPY DIAG:  Difficulty in walking, not elsewhere classified   Acute pain of right knee   Stiffness of right knee, not elsewhere classified   Muscle weakness (generalized)   Rationale for Evaluation and Treatment: Rehabilitation   ONSET DATE: 07/26/24  SUBJECTIVE:    SUBJECTIVE STATEMENT: Pt reports she was sore from last session, still a little bit today  EVAL:  Resting pain 3/10, completed home health PT yesterday, is managing daily tasks at home well . Using walker to get around at home.  Brought wheelchair to today's appt    PERTINENT HISTORY: Extensive history with R LE orthopedics , pathologic R prox femur fx 3 years ago, has lung Ca with METS to R femur , has undergone radiation R femur, had this surgery 07/26/24.   PAIN:  Are you having pain? Yes: NPRS scale: 5/10 Pain location: R lateral thigh, infrapatellar  , medial knee Pain description: deep  Aggravating factors: movement Relieving factors: icing   PRECAUTIONS: Other: WBAT R LE, fall risk   RED FLAGS: Lung ca with METS to R femur        WEIGHT BEARING RESTRICTIONS: No   FALLS:  Has patient fallen in last 6 months? Did not ask   LIVING ENVIRONMENT: Lives with: lives with their spouse Lives in: House/apartment Stairs: No Has following equipment at home: Vannie - 2 wheeled, Environmental Consultant - 4 wheeled, Wheelchair (manual), shower chair, and Grab bars   OCCUPATION: retired   PLOF: Independent with basic ADLs and Independent with household mobility with device   PATIENT GOALS: not reliant on wheelchair   NEXT MD VISIT: 08/14/24   OBJECTIVE:  Note: Objective measures were completed at Evaluation unless otherwise noted.   DIAGNOSTIC FINDINGS: husband provided  with pics on his phone of x rays, has long rod entire length of femur, hingesd   PATIENT SURVEYS:  PSFS: THE PATIENT SPECIFIC FUNCTIONAL SCALE   Place score of 0-10 (0 = unable to perform activity and 10 = able to perform activity at the same level as before injury or problem)   Activity Date: 08/13/24      Car transfers  4      2.bed mobility 5      3.walking greater than 350' for household distance  2      4.         Total Score 11/3: 3.67          Total Score = Sum of activity scores/number of activities   Minimally Detectable Change: 3 points (for single activity); 2 points (for average score)   [facility] (nd). The Patient Specific Functional Scale . Retrieved from [email or web address]    COGNITION: Overall cognitive status: Within functional limits for tasks assessed                         SENSATION: WFL   EDEMA:  Mod edema B lower legs, R thigh and knee greater than L   MUSCLE LENGTH: Hamstrings: Right wnl deg; Left wnl deg Debby test: Right nt deg; Left nt deg   POSTURE: mod edema B Le, more pronounced R compared to L, in standing tends to ER R hip PALPATION: Has normal R patellar mobility, some tenderness   LOWER EXTREMITY ROM:    PROM Right eval Left eval R AROM 08/19/24  Hip flexion       Hip extension       Hip abduction       Hip adduction       Hip internal rotation       Hip external rotation       Knee flexion 42   60 in sitting  Knee extension Hyperextends 3 degrees     Ankle  dorsiflexion       Ankle plantarflexion       Ankle inversion       Ankle eversion        (Blank rows = not tested)   LOWER EXTREMITY MMT:   MMT Right eval Left eval  Hip flexion 3-    Hip extension      Hip abduction 2+    Hip adduction 2+    Hip internal rotation      Hip external rotation      Knee flexion      Knee extension trace    Ankle dorsiflexion      Ankle plantarflexion 3+    Ankle  inversion      Ankle eversion       (Blank rows = not tested)   LOWER EXTREMITY SPECIAL TESTS:  na   FUNCTIONAL TESTS:  Timed up and go (TUG): TBD   GAIT: Distance walked: 50' Assistive device utilized: FWW Level of assistance: SBA Comments: step through gait pattern, does stop walker briefly to advance L LE , utilizes good heel/ toe pattern                                                                                                                                  TREATMENT DATE:       08/19/24 Bike Partial rev x Seated RLE LAQ 2x10 Supine heel slide with peanut ball x 8 Supine AP with bolster under LE x 20 Supine PF RTB  legs on bolster 2 x 10 BLE Supine DF RTB legs on bolster x 10 BLE Seated RLE heel slides on slider 2x10 Nustep L2x65min UE/LE  08/16/24 THERAPEUTIC ACTIVITY:  NuStep L2 6 minutes (LE/UE) Standing 3-way hip abduction, ext, flex R LE holding counter 2x10 verbal cues to keep foot forward with abduction Standing marches R LE 2x10  Seated ankle pumps R LE on double stacked yoga blocks 1x20 Seated knee ext R LE 2x10 Seated heel slides R LE 2x20 Forward lunges on 2in step R LE 10x3'  BALANCE TRAINING The following exercises were performed in static standing with feet shoulder width apart: Eye closed and open  1 minute  EC standing Cervical rotation 5 reps each direction EC standing Cervical flexion and extension 5 reps each direction      08/13/24  Evaluation, education throughout regarding goals Reviewed her current home program, she was able to replicate, includes seated heel slides with yoga strap,seated long arc quads with yoga strap , allowing R knee to bend and gravity assisted R knee flexion stretch. Supine glut sets, supine hip abd/ add with yoga strap,quad sets   Attempted short arc quads with pillow rolled under knee, with yoga strap assist, very painful, performed about 6 reps Also education on floating R heel with pillow due to  some heel pain/ pressure overnight Adjuncted her current home program with new ex including standing heel/toe rocks, also  standing 3 way hip, pushing into floor with foot on washcloth to engage proprioception and some approximation through jts        PATIENT EDUCATION:  Education details: POC, goals Person educated: Patient and Spouse Education method: Explanation, Demonstration, Tactile cues, and Verbal cues Education comprehension: verbalized understanding   HOME EXERCISE PROGRAM: Access Code: [email or web address] URL: [email or web address] Date: 08/13/2024 Prepared by: Greig Speaks   Exercises - Standing 3-way Hip with Walker  - 1 x daily - 7 x weekly - 2 sets - 10 reps - Standing Ankle Dorsiflexion with Table Support  - 1 x daily - 7 x weekly - 2 sets - 10 reps - Supine Short Arc Quad  - 1 x daily - 7 x weekly - 2 sets - 10 reps   ASSESSMENT:   CLINICAL IMPRESSION: Pt is a 74 y.o female treated for R TKA and distal femur replacement. Therapeutic exercise focusing on LE ROM today and light strengthening as we may have overworked last session. Knee flexion has improved to 60 degrees in sitting position. Pt responded well to treatment, will continue to progress as tolerated.  EVAL:  Patient is a 74 y.o. 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    OBJECTIVE IMPAIRMENTS: decreased activity tolerance, decreased balance, decreased endurance, decreased mobility, difficulty walking, decreased ROM, decreased strength, hypomobility, increased edema, impaired perceived functional ability, impaired flexibility, improper body mechanics, postural dysfunction, and pain.    ACTIVITY LIMITATIONS: carrying, lifting, bending, standing, squatting, sleeping, stairs, transfers, bed mobility, bathing, dressing, and locomotion level    PARTICIPATION LIMITATIONS: meal prep, cleaning, laundry, community activity, yard work, and church   PERSONAL FACTORS: Age, Past/current experiences, Time since onset of injury/illness/exacerbation, and 1-2 comorbidities: small cell lung disease , previous pathological fracture R prox femur, are also affecting patient's functional outcome.    REHAB POTENTIAL: Good   CLINICAL DECISION MAKING: Evolving/moderate complexity   EVALUATION COMPLEXITY: Moderate     GOALS: Goals reviewed with patient? Yes   SHORT TERM GOALS: Target date: 4 weeks,09/11/23 I in HEP and progression Baseline:adapted today Goal status: met- 08/19/24     LONG TERM GOALS: Target date: 12 weeks, 11/05/24   Improve PSFS score from 3.67 to 7 or greater Baseline:  Goal status: INITIAL   2.  Improve R knee flexion from 42 to 90 degrees for improved function for transfers, gait, mobility Baseline:  Goal status: INITIAL   3.  Improve R LE strength, hip and knee musculature to 3+/5 throughout, currently trace to 2+/5 Baseline:  Goal status: INITIAL   4.  Gait 350' at a time , without rest,with LAD for household ambulation Baseline:  Goal status: INITIAL      PLAN:   PT FREQUENCY: 2 x week x 1xweek, then 3 x week x 2 weeks, then 2 x week weeks 4 to 12   PT DURATION: 12 weeks   PLANNED INTERVENTIONS: 97110-Therapeutic exercises, 97530- Therapeutic activity, [id number]- Neuromuscular re-education, 97535- Self Care, [id number]- Manual therapy, [id number]- Aquatic Therapy, [phone]- Electrical stimulation (manual), Patient/Family education, Balance training, Stair training, Taping, Cryotherapy, and Moist heat   PLAN FOR NEXT SESSION:  gentle ROM R knee/ hip for flexion ROM, ex to engage LE musculature, trial of light lunges on low step, possible trial of Nustep.  Progress carefully, NMES may be contraindicated due to METS to femur, needs TUG when  able      Chayim Bialas, PT  08/16/2024, 10:30 am "

## 2024-08-21 ENCOUNTER — Ambulatory Visit: Admitting: Rehabilitation

## 2024-08-21 ENCOUNTER — Encounter: Payer: Self-pay | Admitting: Rehabilitation

## 2024-08-21 DIAGNOSIS — M25561 Pain in right knee: Secondary | ICD-10-CM

## 2024-08-21 DIAGNOSIS — M25661 Stiffness of right knee, not elsewhere classified: Secondary | ICD-10-CM

## 2024-08-21 DIAGNOSIS — M6281 Muscle weakness (generalized): Secondary | ICD-10-CM

## 2024-08-21 DIAGNOSIS — R262 Difficulty in walking, not elsewhere classified: Secondary | ICD-10-CM | POA: Diagnosis not present

## 2024-08-23 ENCOUNTER — Encounter: Payer: Self-pay | Admitting: Rehabilitation

## 2024-08-23 ENCOUNTER — Ambulatory Visit: Admitting: Rehabilitation

## 2024-08-23 DIAGNOSIS — M6281 Muscle weakness (generalized): Secondary | ICD-10-CM

## 2024-08-23 DIAGNOSIS — M25561 Pain in right knee: Secondary | ICD-10-CM

## 2024-08-23 DIAGNOSIS — R262 Difficulty in walking, not elsewhere classified: Secondary | ICD-10-CM

## 2024-08-23 DIAGNOSIS — M25661 Stiffness of right knee, not elsewhere classified: Secondary | ICD-10-CM

## 2024-08-23 NOTE — Therapy (Addendum)
 " OUTPATIENT PHYSICAL THERAPY LOWER EXTREMITY TREATMENT     Patient Name: Laura Bush MRN: [id number] DOB:[birth year], 74 y.o., female Today's Date: 08/14/2024   END OF SESSION:    PT End of Session - 08/23/24 0001     Visit Number 5    Date for Recertification  11/05/24    Progress Note Due on Visit 10    PT Start Time 0930    PT Stop Time 1020    PT Time Calculation (min) 50 min    Equipment Utilized During Treatment Gait belt    Activity Tolerance Patient tolerated treatment well    Behavior During Therapy WFL for tasks assessed/performed                  Past Medical History:  Diagnosis Date   Allergy      seasonal   Anemia associated with stage 3 chronic renal failure (HCC) 04/18/2023   Arthritis Last several years    Mostly in hands, wrists, shoulders, knees   Asthma      in cold weather   Breast cancer (HCC) 09/18/2014    left breast   Breast cancer (HCC) 10/07/2014    bx left breast   Breast cancer of upper-outer quadrant of left female breast (HCC) 09/22/2014   Cluster headaches      in her 40's   GERD (gastroesophageal reflux disease)     Goals of care, counseling/discussion 01/14/2022   Heart murmur      as a child (has outgrown)   High cholesterol     History of radiation therapy      Left lung, Right Femur- 02/16/22-03/04/22- Dr. Lynwood Nasuti   History of radiation therapy      Right Pelvis-12/20/23-01/03/24-Dr. Lynwood Nasuti   History of radiation therapy      Right femur-05/02/24-05/17/24- Dr. 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   Stroke [facility]) summer 2022    described as tiny   Swallowing difficulty     Venous insufficiency               Past Surgical History:  Procedure Laterality Date   ABDOMINAL HYSTERECTOMY   1999   APPENDECTOMY   ~ 2006   BONE BIOPSY Right 01/04/2022    Procedure: RIGHT FEMORAL BONE BIOPSY;  Surgeon: Celena Sharper, MD;  Location: [facility] OR;  Service: Orthopedics;  Laterality: Right;   BONE EXCISION Right 04/11/2023    Procedure: BONE EXCISION;  Surgeon: Celena Sharper, MD;  Location: [facility] OR;  Service: Orthopedics;  Laterality: Right;   BREAST BIOPSY Left 10/2014   BREAST LUMPECTOMY Left 2016   BREAST REDUCTION SURGERY Bilateral 11/11/2014    Procedure: Bilateral Breast Reduction;  Surgeon: Alm Sick, MD;  Location: [facility] OR;  Service: Plastics;  Laterality: Bilateral;   BRONCHIAL BIOPSY   01/06/2022    Procedure: BRONCHIAL  BIOPSIES;  Surgeon: Shelah Lamar RAMAN, MD;  Location: [facility] ENDOSCOPY;  Service: Pulmonary;;   BRONCHIAL BRUSHINGS   01/06/2022    Procedure: BRONCHIAL BRUSHINGS;  Surgeon: Shelah Lamar RAMAN, MD;  Location: [facility] ENDOSCOPY;  Service: Pulmonary;;   BRONCHIAL NEEDLE ASPIRATION BIOPSY   01/06/2022    Procedure: BRONCHIAL NEEDLE ASPIRATION BIOPSIES;  Surgeon: Shelah Lamar RAMAN, MD;  Location: [facility] ENDOSCOPY;  Service: Pulmonary;;   BRONCHIAL WASHINGS   01/06/2022    Procedure: BRONCHIAL WASHINGS;  Surgeon: Shelah Lamar RAMAN, MD;  Location: [facility] ENDOSCOPY;  Service: Pulmonary;;   CARPAL TUNNEL RELEASE Bilateral      2 surgeries on right, 1 on left   CESAREAN SECTION   1978; 1980   COLONOSCOPY       FEMUR IM NAIL Right 01/04/2022    Procedure: RIGHT FEMORAL INTRAMEDULLARY (IM) NAIL;  Surgeon: Celena Sharper, MD;  Location: [facility] OR;  Service: Orthopedics;  Laterality: Right;   FIDUCIAL MARKER PLACEMENT   01/06/2022    Procedure: FIDUCIAL MARKER PLACEMENT;  Surgeon: Shelah Lamar RAMAN, MD;  Location: [facility] ENDOSCOPY;  Service:  Pulmonary;;   FOREARM FRACTURE SURGERY Right 2003 X 3   FRACTURE SURGERY   2003, 2023    3 surgeries, right arm -03; Femur-23   HARDWARE REMOVAL Right 04/11/2023    Procedure: HARDWARE REMOVAL;  Surgeon: Celena Sharper, MD;  Location: [facility] OR;  Service: Orthopedics;  Laterality: Right;   KNEE ARTHROSCOPY Bilateral      meniscus repair   PARTIAL MASTECTOMY WITH NEEDLE LOCALIZATION AND AXILLARY SENTINEL LYMPH NODE BX Left 11/11/2014    Procedure: LEFT BREAST PARTIAL MASTECTOMY WITH NEEDLE LOCALIZATION TIMES TWO AND LEFT AXILLARY SENTINEL LYMPH NODE Biopsy;  Surgeon: Elon Pacini, MD;  Location: [facility] OR;  Service: General;  Laterality: Left;   REDUCTION MAMMAPLASTY Bilateral 2016   SQUAMOUS CELL CARCINOMA EXCISION   1980's X 1    nose   TONSILLECTOMY   ~ 1957   TUBAL LIGATION   ?1984            Patient Active Problem List    Diagnosis Date Noted   Metastasis to bone (HCC) 02/05/2024   Facial pain 10/23/2023   Anemia associated with stage 3 chronic renal failure (HCC) 04/18/2023   Lower extremity edema 12/21/2022   Insulin  resistance 09/13/2022   Motion sickness 01/20/2022   Visit for suture removal 01/20/2022   HLD (hyperlipidemia) 01/20/2022   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 Denver 04/21/2020   Osteopenia 10/02/2018   Malignant 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,  estrogen receptor positive (HCC) 05/29/2017   Ductal carcinoma in situ (DCIS) of right breast 05/29/2017   Vitamin D  deficiency 01/29/2016   Eczema 12/29/2014   Venous insufficiency 10/16/2014   Primary hypertension 10/01/2014   Hypercholesteremia 10/01/2014   GERD (gastroesophageal reflux disease) 10/01/2014   Obesity 10/23/2013   Obstructive sleep apnea syndrome 07/19/2013   Nodule of finger  12/24/2009   Other bilateral bundle branch block 06/23/2003      PCP: Watt Raisin MD   REFERRING PROVIDER: Timmy Maude SAUNDERS MD   REFERRING DIAG: s/p hing TKA and distal femur replacement secondary to metastasis and OA R knee   THERAPY DIAG:  Difficulty in walking, not elsewhere classified   Acute pain of right knee   Stiffness of right knee, not elsewhere classified   Muscle weakness (generalized)   Rationale for Evaluation and Treatment: Rehabilitation   ONSET DATE: 07/26/24   SUBJECTIVE:    SUBJECTIVE STATEMENT: Patient reports feeling good today resting pain 2/10 pain with some stiffness and swelling in R knee when she woke up this morning.  EVAL:  Resting pain 3/10, completed home health PT yesterday, is managing daily tasks at home well . Using walker to get around at home.  Brought wheelchair to today's appt    PERTINENT HISTORY: Extensive history with R LE orthopedics , pathologic R prox femur fx 3 years ago, has lung Ca with METS to R femur , has undergone radiation R femur, had this surgery 07/26/24.   PAIN:  Are you having pain? Yes: NPRS scale: 5/10 Pain location: R lateral thigh, infrapatellar  , medial knee Pain description: deep  Aggravating factors: movement Relieving factors: icing   PRECAUTIONS: Other: WBAT R LE, fall risk   RED FLAGS: Lung ca with METS to R femur        WEIGHT BEARING RESTRICTIONS: No   FALLS:  Has patient fallen in last 6 months? Did not ask   LIVING ENVIRONMENT: Lives with: lives with their spouse Lives in:  House/apartment Stairs: No Has following equipment at home: Vannie - 2 wheeled, Environmental Consultant - 4 wheeled, Wheelchair (manual), shower chair, and Grab bars   OCCUPATION: retired   PLOF: Independent with basic ADLs and Independent with household mobility with device   PATIENT GOALS: not reliant on wheelchair   NEXT MD VISIT: 08/14/24   OBJECTIVE:  Note: Objective measures were completed at Evaluation unless otherwise noted.   DIAGNOSTIC FINDINGS: husband provided with pics on his phone of x rays, has long rod entire length of femur, hingesd   PATIENT SURVEYS:  PSFS: THE PATIENT SPECIFIC FUNCTIONAL SCALE   Place score of 0-10 (0 = unable to perform activity and 10 = able to perform activity at the same level as before injury or problem)   Activity Date: 08/13/24      Car transfers  4      2.bed mobility 5      3.walking greater than 350' for household distance  2      4.         Total Score 11/3: 3.67          Total Score = Sum of activity scores/number of activities   Minimally Detectable Change: 3 points (for single activity); 2 points (for average score)   [facility] (nd). The Patient Specific Functional Scale . Retrieved from [email or web address]    COGNITION: Overall cognitive status: Within functional limits for tasks assessed                         SENSATION: WFL   EDEMA:  Mod edema B lower legs, R thigh and knee greater than L   MUSCLE LENGTH: Hamstrings: Right wnl deg; Left wnl deg Debby test: Right nt deg; Left nt deg   POSTURE: mod edema B Le, more pronounced R compared to L,  in standing tends to ER R hip PALPATION: Has normal R patellar mobility, some tenderness   LOWER EXTREMITY ROM:    PROM Right eval Left eval R AROM 08/19/24 R  08/23/24  Hip flexion        Hip extension        Hip abduction        Hip adduction        Hip internal rotation        Hip external rotation        Knee  flexion 42   60 in sitting 65 in sitting  Knee extension Hyperextends 3 degrees      Ankle dorsiflexion        Ankle plantarflexion        Ankle inversion        Ankle eversion         (Blank rows = not tested)   LOWER EXTREMITY MMT:   MMT Right eval Left eval  Hip flexion 3-    Hip extension      Hip abduction 2+    Hip adduction 2+    Hip internal rotation      Hip external rotation      Knee flexion      Knee extension trace    Ankle dorsiflexion      Ankle plantarflexion 3+    Ankle inversion      Ankle eversion       (Blank rows = not tested)   LOWER EXTREMITY SPECIAL TESTS:  na   FUNCTIONAL TESTS:  Timed up and go (TUG): TBD   GAIT: Distance walked: 50' Assistive device utilized: FWW Level of assistance: SBA Comments: step through gait pattern, does stop walker briefly to advance L LE , utilizes good heel/ toe pattern                                                                                                                                  TREATMENT DATE: 08/23/24 THERAPEUTIC EXERCISE: To improve strength, endurance, ROM, and flexibility in.  Demonstration, verbal and tactile cues throughout for technique.  NuStep L1 x 5 min UE/LE  THERAPEUTIC ACTIVITIES: To improve functional performance.  Demonstration, verbal and tactile cues throughout for technique. Seated knee flexion on slider 2x10 RLE Standing 3 way hip 3# ankle wt x 10 each direction RLE Sidestepping holding counter 2x down and back with 3lb ankle wt on RLE DF and PF with RTB 2x10  Seated adduction with ball  between knee 5s hold 2x5 Supine short arc quad with towel under popliteal 2x10 RLE  SLR stretch RLE with PT assisting with hand under calf and pt holding thigh in 80 degree hip flexion x 2/5  NEUROMUSCULAR RE-EDUCATION: To improve proprioception and balance. Static standing EC and EO in each direction cervical flex, ext, rot with feet shoulder width apart Static standing on airex with  PT guarding EC in neutral  Standing heel raise on airex eyes open x 10 BLE Standing toe raise on airex eyes open x 10 BLE  Vitals:  98% on RA  08/21/24 THERAPEUTIC EXERCISE: To improve strength and ROM.  Demonstration, verbal and tactile cues throughout for technique. NuStep L2 x 6' BUE/BLE  Standing 3 way hip 3# ankle wt x 10 each direction RLE Step stance on 6 step with gentle forward lunges keeping most of her weight on the LLE x 15 RLE Seated knee extension with PT assist from 60 to 0 x 2/10 RLE Seated knee flexion on slider x 20 RLE Seated knee flexion stretch on slider x 1' x 3 RLE Supine peanut ball knee flexion x 20 BLE Supine QS x 10 RLE Supine SLR w/ PT assisting w/ hand under knee and ankle x 2/5 RLE GS x 10 BLE  Added sidestepping at kitchen counter at home, but patient was too fatigued to perform in clinic today Medbridge app updated, handouts provided  08/19/24 Bike Partial rev x Seated RLE LAQ 2x10 Supine heel slide with peanut ball x 8 Supine AP with bolster under LE x 20 Supine PF RTB  legs on bolster 2 x 10 BLE Supine DF RTB legs on bolster x 10 BLE Seated RLE heel slides on slider 2x10 Nustep L2x23min UE/LE  08/16/24 THERAPEUTIC ACTIVITY:  NuStep L2 6 minutes (LE/UE) Standing 3-way hip abduction, ext, flex R LE holding counter 2x10 verbal cues to keep foot forward with abduction Standing marches R LE 2x10  Seated ankle pumps R LE on double stacked yoga blocks 1x20 Seated knee ext R LE 2x10 Seated heel slides R LE 2x20 Forward lunges on 2in step R LE 10x3'  BALANCE TRAINING The following exercises were performed in static standing with feet shoulder width apart: Eye closed and open  1 minute  EC standing Cervical rotation 5 reps each direction EC standing Cervical flexion and extension 5 reps each direction      08/13/24  Evaluation, education throughout regarding goals Reviewed her current home program, she was able to replicate, includes  seated heel slides with yoga strap,seated long arc quads with yoga strap , allowing R knee to bend and gravity assisted R knee flexion stretch. Supine glut sets, supine hip abd/ add with yoga strap,quad sets   Attempted short arc quads with pillow rolled under knee, with yoga strap assist, very painful, performed about 6 reps Also education on floating R heel with pillow due to some heel pain/ pressure overnight Adjuncted her current home program with new ex including standing heel/toe rocks, also standing 3 way hip, pushing into floor with foot on washcloth to engage proprioception and some approximation through jts        PATIENT EDUCATION:  Education details: POC, goals Person educated: Patient and Spouse Education method: Explanation, Demonstration, Tactile cues, and Verbal cues Education comprehension: verbalized understanding   HOME EXERCISE PROGRAM: Access Code: [email or web address] URL: [email or web address] Date: 08/21/2024 Prepared by: Garnette Montclair  Exercises - Standing 3-way Hip with Vannie  - 1 x daily - 7 x weekly - 2 sets - 10 reps - Standing Ankle Dorsiflexion with Table Support  - 1 x daily - 7 x weekly - 2 sets - 10 reps - Supine Short Arc Quad  - 1 x daily - 7 x weekly - 2 sets - 10 reps - Supine Hip and Knee Flexion AROM with Swiss Ball  - 1 x daily - 7 x weekly - 3 sets - 10 reps -  Seated Knee Extension AAROM  - 1 x daily - 7 x weekly - 3 sets - 10 reps - Hip Flexion with Knee Extension Caregiver PROM  - 1 x daily - 7 x weekly - 3 sets - 10 reps  ASSESSMENT:   CLINICAL IMPRESSION:  08/23/24: Pt is progressing well with increasing knee flexion ROM and limiting use on walker for dynamic stability since initial eval. Pt is able to tolerate and progress with some assisted LAQ and SLR exercises. They are difficult for her, but she tolerates well. R knee flexion today is 62-65 degrees.  Her incision is healing well with steri strips being completely off today.  Noticed 2- putting edema in R LE with no redness. O2 today was good with no concerns. She is progressing toward goals.  PT remains necessary for gait, balance, HEP, safety, strength, ROM deficits. Continue per POC  Pt is a 74 y.o female treated for R TKA and distal femur replacement. Therapeutic exercise focusing on LE ROM today and light strengthening as we may have overworked last session. Knee flexion has improved to 60 degrees in sitting position. Pt responded well to treatment, will continue to progress as tolerated.  EVAL:  Patient is a 74 y.o. 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    OBJECTIVE IMPAIRMENTS: decreased activity tolerance, decreased balance, decreased endurance, decreased mobility, difficulty walking, decreased ROM, decreased strength, hypomobility, increased edema, impaired perceived functional ability, impaired flexibility, improper body mechanics, postural dysfunction, and pain.    ACTIVITY LIMITATIONS: carrying, lifting, bending, standing, squatting, sleeping, stairs, transfers, bed mobility, bathing, dressing, and locomotion level   PARTICIPATION LIMITATIONS: meal prep, cleaning, laundry, community activity, yard work, and church   PERSONAL FACTORS: Age, Past/current experiences, Time since onset of injury/illness/exacerbation, and 1-2 comorbidities: small cell lung disease , previous pathological fracture R prox femur, are also affecting patient's functional outcome.    REHAB POTENTIAL: Good   CLINICAL DECISION MAKING: Evolving/moderate complexity   EVALUATION COMPLEXITY: Moderate     GOALS: Goals reviewed with patient? Yes   SHORT TERM GOALS: Target date: 4 weeks,09/11/23 I in HEP and progression Baseline:adapted today Goal status: met- 08/19/24     LONG TERM GOALS: Target date: 12 weeks, 11/05/24    Improve PSFS score from 3.67 to 7 or greater Baseline:  Goal status: INITIAL   2.  Improve R knee flexion from 42 to 90 degrees for improved function for transfers, gait, mobility Baseline:  08/23/24: see updated tables above Goal status: IN PROGRESS   3.  Improve R LE strength, hip and knee musculature to 3+/5 throughout, currently trace to 2+/5 Baseline:  Goal status: INITIAL   4.  Gait 350' at a time , without rest,with LAD for household ambulation Baseline:  Goal status: INITIAL       5.   Patient will demonstrate </= 14 sec on TUG test to reduce fall risk   Baseline:  TBD   Goal status:  INITIAL PLAN:   PT FREQUENCY: 2 x week x 1xweek, then 3 x week x 2 weeks, then 2 x week weeks 4 to 12   PT DURATION: 12 weeks   PLANNED INTERVENTIONS: 97110-Therapeutic exercises, 97530- Therapeutic activity, [id number]- Neuromuscular re-education, 97535- Self Care, [id number]- Manual therapy, [id number]- Aquatic Therapy, [phone]- Electrical stimulation (manual), Patient/Family education, Balance training, Stair training, Taping, Cryotherapy, and Moist heat  PLAN FOR NEXT SESSION:  TUG, foam balance activities;  continue to work on knee flexion ROM and knee strengthening     Delvis Kau, Student-PT  08/16/2024, 10:30 am "

## 2024-08-26 ENCOUNTER — Encounter: Payer: Self-pay | Admitting: Hematology & Oncology

## 2024-08-26 ENCOUNTER — Inpatient Hospital Stay: Admitting: Hematology & Oncology

## 2024-08-26 ENCOUNTER — Other Ambulatory Visit: Payer: Self-pay

## 2024-08-26 ENCOUNTER — Inpatient Hospital Stay

## 2024-08-26 ENCOUNTER — Ambulatory Visit: Payer: Self-pay | Admitting: Hematology & Oncology

## 2024-08-26 ENCOUNTER — Ambulatory Visit

## 2024-08-26 ENCOUNTER — Inpatient Hospital Stay: Attending: Hematology & Oncology

## 2024-08-26 VITALS — BP 126/56 | HR 76 | Temp 98.1°F | Resp 18 | Ht 58.5 in | Wt 177.0 lb

## 2024-08-26 DIAGNOSIS — C7951 Secondary malignant neoplasm of bone: Secondary | ICD-10-CM | POA: Diagnosis not present

## 2024-08-26 DIAGNOSIS — N183 Chronic kidney disease, stage 3 unspecified: Secondary | ICD-10-CM | POA: Insufficient documentation

## 2024-08-26 DIAGNOSIS — C3490 Malignant neoplasm of unspecified part of unspecified bronchus or lung: Secondary | ICD-10-CM | POA: Diagnosis not present

## 2024-08-26 DIAGNOSIS — M25661 Stiffness of right knee, not elsewhere classified: Secondary | ICD-10-CM

## 2024-08-26 DIAGNOSIS — R262 Difficulty in walking, not elsewhere classified: Secondary | ICD-10-CM

## 2024-08-26 DIAGNOSIS — M6281 Muscle weakness (generalized): Secondary | ICD-10-CM

## 2024-08-26 DIAGNOSIS — C3492 Malignant neoplasm of unspecified part of left bronchus or lung: Secondary | ICD-10-CM | POA: Insufficient documentation

## 2024-08-26 DIAGNOSIS — M25561 Pain in right knee: Secondary | ICD-10-CM

## 2024-08-26 DIAGNOSIS — D631 Anemia in chronic kidney disease: Secondary | ICD-10-CM | POA: Insufficient documentation

## 2024-08-26 LAB — CBC WITH DIFFERENTIAL (CANCER CENTER ONLY)
Abs Immature Granulocytes: 0.06 K/uL (ref 0.00–0.07)
Basophils Absolute: 0 K/uL (ref 0.0–0.1)
Basophils Relative: 1 %
Eosinophils Absolute: 0.3 K/uL (ref 0.0–0.5)
Eosinophils Relative: 5 %
HCT: 30.5 % — ABNORMAL LOW (ref 36.0–46.0)
Hemoglobin: 9.5 g/dL — ABNORMAL LOW (ref 12.0–15.0)
Immature Granulocytes: 1 %
Lymphocytes Relative: 18 %
Lymphs Abs: 0.9 K/uL (ref 0.7–4.0)
MCH: 30.3 pg (ref 26.0–34.0)
MCHC: 31.1 g/dL (ref 30.0–36.0)
MCV: 97.1 fL (ref 80.0–100.0)
Monocytes Absolute: 0.7 K/uL (ref 0.1–1.0)
Monocytes Relative: 14 %
Neutro Abs: 3 K/uL (ref 1.7–7.7)
Neutrophils Relative %: 61 %
Platelet Count: 315 K/uL (ref 150–400)
RBC: 3.14 MIL/uL — ABNORMAL LOW (ref 3.87–5.11)
RDW: 15.9 % — ABNORMAL HIGH (ref 11.5–15.5)
WBC Count: 5 K/uL (ref 4.0–10.5)
nRBC: 0 % (ref 0.0–0.2)

## 2024-08-26 LAB — CMP (CANCER CENTER ONLY)
ALT: 10 U/L (ref 0–44)
AST: 13 U/L — ABNORMAL LOW (ref 15–41)
Albumin: 4.1 g/dL (ref 3.5–5.0)
Alkaline Phosphatase: 56 U/L (ref 38–126)
Anion gap: 11 (ref 5–15)
BUN: 12 mg/dL (ref 8–23)
CO2: 26 mmol/L (ref 22–32)
Calcium: 10 mg/dL (ref 8.9–10.3)
Chloride: 103 mmol/L (ref 98–111)
Creatinine: 1.02 mg/dL — ABNORMAL HIGH (ref 0.44–1.00)
GFR, Estimated: 58 mL/min — ABNORMAL LOW
Glucose, Bld: 101 mg/dL — ABNORMAL HIGH (ref 70–99)
Potassium: 4.1 mmol/L (ref 3.5–5.1)
Sodium: 139 mmol/L (ref 135–145)
Total Bilirubin: 0.2 mg/dL (ref 0.0–1.2)
Total Protein: 7.5 g/dL (ref 6.5–8.1)

## 2024-08-26 LAB — RETICULOCYTES
Immature Retic Fract: 23.9 % — ABNORMAL HIGH (ref 2.3–15.9)
RBC.: 3.19 MIL/uL — ABNORMAL LOW (ref 3.87–5.11)
Retic Count, Absolute: 91.6 K/uL (ref 19.0–186.0)
Retic Ct Pct: 2.9 % (ref 0.4–3.1)

## 2024-08-26 LAB — IRON AND IRON BINDING CAPACITY (CC-WL,HP ONLY)
Iron: 37 ug/dL (ref 28–170)
Saturation Ratios: 13 % (ref 10.4–31.8)
TIBC: 290 ug/dL (ref 250–450)
UIBC: 253 ug/dL

## 2024-08-26 LAB — FERRITIN: Ferritin: 210 ng/mL (ref 11–307)

## 2024-08-26 MED ORDER — DENOSUMAB 120 MG/1.7ML ~~LOC~~ SOLN
120.0000 mg | Freq: Once | SUBCUTANEOUS | Status: AC
  Administered 2024-08-26: 120 mg via SUBCUTANEOUS
  Filled 2024-08-26: qty 1.7

## 2024-08-26 MED ORDER — DARBEPOETIN ALFA 300 MCG/0.6ML IJ SOSY
300.0000 ug | PREFILLED_SYRINGE | Freq: Once | INTRAMUSCULAR | Status: AC
  Administered 2024-08-26: 300 ug via SUBCUTANEOUS
  Filled 2024-08-26: qty 0.6

## 2024-08-26 NOTE — Progress Notes (Signed)
 " Hematology and Oncology Follow Up Visit  Laura Bush [id number] [birth year] 74 y.o. 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 3 months  -next dose in 11/2024    S/P SBRT to LUL -- completed on 03/04/2022 S/P XRT to RIGHT femur -- completed on 03/04/2022 Alecensa  600 mg po BID -- start on 03/24/2022 --on hold starting 04/04/2023 -restart 300 mg p.o. twice daily on 05/04/2023 --DC on 05/26/2023 Lorbrena  100 mg po q day  -- start on 06/14/2023 Aranesp  300 mcg subcu every 3 weeks Venofer -given on 07/10/2023     Interim History:  [name] is back for follow-up.  She has a lot happier since we last saw her.  She did have her extensive hip and knee surgery at [facility].  This was I think back in early December.  She had the right knee replaced and a distal femoral replacement.  Surprisingly, the pathology on the femur showed that she had extensive metastatic adenocarcinoma.  Of note, she had radiation treatments before and I think after surgery.  She is doing physical therapy.  I think she goes 3 times a week.  I saw pictures of the replacement prosthesis.  I am very impressed with how it looked.  Apparently, this was a 5-1/2-hour surgery.  She had about 80 staples.  As always, [facility] did a fantastic job with her.  She she saw thoracic oncology.  He saw Dr. Nidia.  As always, she provided a lot of great input.  I do appreciate the consultation.  I am just happy that she has recovered from the surgery.  I know this was quite intense.  Her swelling of the legs are still present but maybe not as much so.  She is now off Zaroxolyn .  She had problems with hypokalemia..  She has had no issues with cough.  She has had no nausea or vomiting.  There is been no change in  bowel or bladder habits.  There is been no bleeding.  One of the recommendations made by Dr. Nidia was a MRI of the brain as surveillance.  We can certainly set this up.     Medications:    Current Outpatient Medications:    acetaminophen  (TYLENOL ) 500 MG tablet, Take 1 tablet (500 mg total) by mouth every 8 (eight) hours as needed for mild pain or moderate pain., Disp: 30 tablet, Rfl: 0   AIRSUPRA  90-80 MCG/ACT AERO, INHALE 2 PUFFS BY MOUTH EVERY 6 HOURS AS NEEDED, Disp: 10.7 g, Rfl: 1   aspirin EC 81 MG tablet, Take 81 mg by mouth in the morning. Swallow whole., Disp: , Rfl:    B Complex-C (SUPER B COMPLEX PO), Take 1 tablet by mouth daily with breakfast., Disp: , Rfl:    Calcium  Carb-Cholecalciferol (CALCIUM  + D3 PO), Take 1 tablet by mouth in the morning., Disp: , Rfl:    cholecalciferol (VITAMIN D3) 25 MCG (1000 UNIT) tablet, Take 1,000 Units by mouth in the morning., Disp: , Rfl:    colchicine  0.6 MG tablet, Take 1.2 mg once, then take 0.6 mg an hour later.  Hold the rest of the rx for later use, Disp: 15 tablet, Rfl: 0   COLLAGEN PO, Take 3 tablets by mouth in the morning and at bedtime. With Biotin (Patient  taking differently: Take 2 tablets by mouth daily. With Biotin), Disp: , Rfl:    Denosumab  (XGEVA  ), Inject into the skin every 3 (three) months., Disp: , Rfl:    enoxaparin  (LOVENOX ) 40 MG/0.4ML injection, Inject 40 mg into the skin daily., Disp: , Rfl:    Evolocumab  (REPATHA  SURECLICK) 140 MG/ML SOAJ, Inject 140 mg into the skin every 14 (fourteen) days., Disp: 6 mL, Rfl: 3   fexofenadine (ALLEGRA) 180 MG tablet, Take 180 mg by mouth in the morning., Disp: , Rfl:    folic acid  (FOLATE) 400 MCG tablet, Take 400 mcg by mouth in the morning., Disp: , Rfl:    furosemide  (LASIX ) 20 MG tablet, TAKE 2 TABLETS BY MOUTH DAILY, Disp: 180 tablet, Rfl: 3   ibuprofen (ADVIL) 200 MG tablet, Take 600 mg by mouth daily as needed., Disp: , Rfl:    ipratropium (ATROVENT ) 0.06 % nasal spray,  Place 2 sprays into both nostrils 4 (four) times daily as needed for rhinitis., Disp: 15 mL, Rfl: 4   lorlatinib  (LORBRENA ) 100 MG tablet, Take 1 tablet (100 mg total) by mouth daily. Swallow tablets whole. Do not chew, crush or split tablets., Disp: 30 tablet, Rfl: 0   metFORMIN  (GLUCOPHAGE ) 500 MG tablet, Take 1 tablet by mouth daily, Disp: 90 tablet, Rfl: 3   NIFEdipine  (PROCARDIA  XL/NIFEDICAL XL) 60 MG 24 hr tablet, TAKE 1 TABLET BY MOUTH DAILY, Disp: 90 tablet, Rfl: 3   pantoprazole  (PROTONIX ) 40 MG tablet, Take 1 tablet (40 mg total) by mouth daily., Disp: 90 tablet, Rfl: 0   potassium chloride  SA (KLOR-CON  M) 20 MEQ tablet, TAKE 1 TABLET BY MOUTH 2 TIMES A DAY, Disp: 60 tablet, Rfl: 5   senna-docusate (SENOKOT-S) 8.6-50 MG tablet, Take 1 tablet by mouth at bedtime as needed., Disp: , Rfl:    spironolactone  (ALDACTONE ) 25 MG tablet, TAKE 1 TABLET BY MOUTH DAILY, Disp: 90 tablet, Rfl: 1   tirzepatide  (MOUNJARO ) 7.5 MG/0.5ML Pen, Inject 7.5 mg into the skin once a week., Disp: 6 mL, Rfl: 1   traZODone  (DESYREL ) 50 MG tablet, TAKE 1 TABLET BY MOUTH EVERY NIGHT AT BEDTIME AS NEEDED FOR SLEEP, Disp: 90 tablet, Rfl: 2   valsartan  (DIOVAN ) 80 MG tablet, TAKE 1 TABLET BY MOUTH DAILY, Disp: 90 tablet, Rfl: 1  Allergies:  Allergies  Allergen Reactions   Diclofenac  Hypertension and Other (See Comments)    Other Reaction(s): Hypertension   Amlodipine  Swelling and Other (See Comments)    Limbs swell, not the throat   Lanolin Other (See Comments)    Sneezing and watery eyes. Allergic to Wool.  Other Reaction(s): Other (See Comments)  Sneezing and watery eyes. Allergic to Wool.    Sneezing and watery eyes. Allergic to Wool.   Silicone Dermatitis and Other (See Comments)    Redness (Bandaids also)   Tape Other (See Comments)    Redness (Bandaids also)    Past Medical History, Surgical history, Social history, and Family History were reviewed and updated.  Review of Systems: Review of Systems   Constitutional: Negative.   HENT:  Negative.    Eyes: Negative.   Respiratory: Negative.    Cardiovascular: Negative.   Gastrointestinal: Negative.   Endocrine: Negative.   Genitourinary: Negative.    Musculoskeletal:  Positive for arthralgias.  Skin: Negative.   Neurological: Negative.   Hematological: Negative.   Psychiatric/Behavioral: Negative.      Physical Exam: Vital signs show temperature of 98.1.  Pulse 70.  Blood pressure 119/60.  Weight is 179 pounds.    Wt Readings from Last 3 Encounters:  08/15/24 175 lb 9.6 oz (79.7 kg)  07/08/24 179 lb (81.2 kg)  06/17/24 (P) 180 lb 4 oz (81.8 kg)    Physical Exam Vitals reviewed.  HENT:     Head: Normocephalic and atraumatic.  Eyes:     Pupils: Pupils are equal, round, and reactive to light.  Cardiovascular:     Rate and Rhythm: Normal rate and regular rhythm.     Heart sounds: Normal heart sounds.  Pulmonary:     Effort: Pulmonary effort is normal.     Breath sounds: Normal breath sounds.  Abdominal:     General: Bowel sounds are normal.     Palpations: Abdomen is soft.  Musculoskeletal:        General: No tenderness or deformity. Normal range of motion.     Cervical back: Normal range of motion.     Comments: She has healed surgical scar on the lateral aspect of her right thigh.  This extends down right below her right knee.  About 1+ edema in the lower legs.  I cannot palpate any venous cord.  She has a negative Homans' sign.  Lymphadenopathy:     Cervical: No cervical adenopathy.  Skin:    General: Skin is warm and dry.     Findings: No erythema or rash.  Neurological:     Mental Status: She is alert and oriented to person, place, and time.  Psychiatric:        Behavior: Behavior normal.        Thought Content: Thought content normal.        Judgment: Judgment normal.      Lab Results  Component Value Date   WBC 5.0 08/26/2024   HGB 9.5 (L) 08/26/2024   HCT 30.5 (L) 08/26/2024   MCV 97.1 08/26/2024    PLT 315 08/26/2024     Chemistry      Component Value Date/Time   NA 139 08/26/2024 0904   NA 142 07/17/2023 1358   NA 142 05/29/2017 1002   K 4.1 08/26/2024 0904   K 3.9 05/29/2017 1002   CL 103 08/26/2024 0904   CO2 26 08/26/2024 0904   CO2 28 05/29/2017 1002   BUN 12 08/26/2024 0904   BUN 26 07/17/2023 1358   BUN 13.5 05/29/2017 1002   CREATININE 1.02 (H) 08/26/2024 0904   CREATININE 0.81 04/06/2020 1059   CREATININE 0.8 05/29/2017 1002      Component Value Date/Time   CALCIUM  10.0 08/26/2024 0904   CALCIUM  9.4 05/29/2017 1002   ALKPHOS 56 08/26/2024 0904   ALKPHOS 39 (L) 05/29/2017 1002   AST 13 (L) 08/26/2024 0904   AST 18 05/29/2017 1002   ALT 10 08/26/2024 0904   ALT 19 05/29/2017 1002   BILITOT 0.2 08/26/2024 0904   BILITOT 0.48 05/29/2017 1002       Impression and Plan: [name] is a very charming 74 year old white female.  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  This was actually a small lesion.  I am so happy that she got through surgery.  This really was quite invasive.  However, it was quite successful.  I think we are going to have to get a whole-body PET scan on her at this point.  Apparently, the PET scan that we did in the past may not have gone down far enough to localize the cancer in the distal femur.  We will see about the PET scan in February.  We will also see about doing a MRI of the brain in February.  She will continue on the Lorbrena .  I think this really is helping her out.  Again I do appreciate everybody's help at [facility].  Everybody really did a fantastic job to give [name] a better quality of life.  She did lose quite a bit of blood with all of her surgery.  I think she says she was transfused.  We will going give her a dose of Aranesp  today.  She also had Xgeva  today.  We we will plan to get her back in another month or so for follow-up.   Laura JONELLE Crease, MD 1/19/20269:58 AM  "

## 2024-08-26 NOTE — Addendum Note (Signed)
 Addended by: Meeka Cartelli on: 08/26/2024 04:32 PM   Modules accepted: Orders

## 2024-08-26 NOTE — Patient Instructions (Signed)
 Denosumab  Injection (Oncology) What is this medication? DENOSUMAB  (den oh SUE mab) prevents weakened bones caused by cancer. It may also be used to treat noncancerous bone tumors that cannot be removed by surgery. It can also be used to treat high calcium  levels in the blood caused by cancer. It works by blocking a protein that causes bones to break down quickly. This slows down the release of calcium  from bones, which lowers calcium  levels in your blood. It also makes your bones stronger and less likely to break (fracture). This medicine may be used for other purposes; ask your health care provider or pharmacist if you have questions. COMMON BRAND NAME(S): XGEVA  What should I tell my care team before I take this medication? They need to know if you have any of these conditions: Dental disease Having surgery or tooth extraction Infection Kidney disease Low levels of calcium  or vitamin D  in the blood Malnutrition On hemodialysis Skin conditions or sensitivity Thyroid  or parathyroid disease An unusual reaction to denosumab , other medications, foods, dyes, or preservatives Pregnant or trying to get pregnant Breast-feeding How should I use this medication? This medication is for injection under the skin. It is given by your care team in a hospital or clinic setting. A special MedGuide will be given to you before each treatment. Be sure to read this information carefully each time. Talk to your care team about the use of this medication in children. While it may be prescribed for children as young as 13 years for selected conditions, precautions do apply. Overdosage: If you think you have taken too much of this medicine contact a poison control center or emergency room at once. NOTE: This medicine is only for you. Do not share this medicine with others. What if I miss a dose? Keep appointments for follow-up doses. It is important not to miss your dose. Call your care team if you are unable to  keep an appointment. What may interact with this medication? Do not take this medication with any of the following: Other medications containing denosumab  This medication may also interact with the following: Medications that lower your chance of fighting infection Steroid medications, such as prednisone  or cortisone This list may not describe all possible interactions. Give your health care provider a list of all the medicines, herbs, non-prescription drugs, or dietary supplements you use. Also tell them if you smoke, drink alcohol, or use illegal drugs. Some items may interact with your medicine. What should I watch for while using this medication? Your condition will be monitored carefully while you are receiving this medication. You may need blood work while taking this medication. This medication may increase your risk of getting an infection. Call your care team for advice if you get a fever, chills, sore throat, or other symptoms of a cold or flu. Do not treat yourself. Try to avoid being around people who are sick. You should make sure you get enough calcium  and vitamin D  while you are taking this medication, unless your care team tells you not to. Discuss the foods you eat and the vitamins you take with your care team. Some people who take this medication have severe bone, joint, or muscle pain. This medication may also increase your risk for jaw problems or a broken thigh bone. Tell your care team right away if you have severe pain in your jaw, bones, joints, or muscles. Tell your care team if you have any pain that does not go away or that gets worse. Talk  to your care team if you may be pregnant. Serious birth defects can occur if you take this medication during pregnancy and for 5 months after the last dose. You will need a negative pregnancy test before starting this medication. Contraception is recommended while taking this medication and for 5 months after the last dose. Your care team  can help you find the option that works for you. What side effects may I notice from receiving this medication? Side effects that you should report to your care team as soon as possible: Allergic reactions--skin rash, itching, hives, swelling of the face, lips, tongue, or throat Bone, joint, or muscle pain Low calcium  level--muscle pain or cramps, confusion, tingling, or numbness in the hands or feet Osteonecrosis of the jaw--pain, swelling, or redness in the mouth, numbness of the jaw, poor healing after dental work, unusual discharge from the mouth, visible bones in the mouth Side effects that usually do not require medical attention (report to your care team if they continue or are bothersome): Cough Diarrhea Fatigue Headache Nausea This list may not describe all possible side effects. Call your doctor for medical advice about side effects. You may report side effects to FDA at 1-800-FDA-1088. Where should I keep my medication? This medication is given in a hospital or clinic. It will not be stored at home. NOTE: This sheet is a summary. It may not cover all possible information. If you have questions about this medicine, talk to your doctor, pharmacist, or health care provider.  2024 Elsevier/Gold Standard (2021-12-15 00:00:00)

## 2024-08-26 NOTE — Therapy (Signed)
 " OUTPATIENT PHYSICAL THERAPY LOWER EXTREMITY TREATMENT     Patient Name: Laura Bush MRN: [id number] DOB:October 23, 1950, 74 y.o., female Today's Date: 08/14/2024   END OF SESSION:    PT End of Session - 08/26/24 1145     Visit Number 6    Date for Recertification  11/05/24    Progress Note Due on Visit 10    PT Start Time 1103    PT Stop Time 1142    PT Time Calculation (min) 39 min    Equipment Utilized During Treatment --    Activity Tolerance Patient tolerated treatment well    Behavior During Therapy WFL for tasks assessed/performed                   Past Medical History:  Diagnosis Date   Allergy      seasonal   Anemia associated with stage 3 chronic renal failure (HCC) 04/18/2023   Arthritis Last several years    Mostly in hands, wrists, shoulders, knees   Asthma      in cold weather   Breast cancer (HCC) 09/18/2014    left breast   Breast cancer (HCC) 10/07/2014    bx left breast   Breast cancer of upper-outer quadrant of left female breast (HCC) 09/22/2014   Cluster headaches      in her 40's   GERD (gastroesophageal reflux disease)     Goals of care, counseling/discussion 01/14/2022   Heart murmur      as a child (has outgrown)   High cholesterol     History of radiation therapy      Left lung, Right Femur- 02/16/22-03/04/22- Dr. Lynwood Nasuti   History of radiation therapy      Right Pelvis-12/20/23-01/03/24-Dr. Lynwood Nasuti   History of radiation therapy      Right femur-05/02/24-05/17/24- Dr. 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   Stroke [facility]) summer 2022    described as tiny   Swallowing difficulty     Venous insufficiency               Past Surgical History:  Procedure Laterality Date   ABDOMINAL HYSTERECTOMY   1999   APPENDECTOMY   ~ 2006   BONE BIOPSY Right 01/04/2022    Procedure: RIGHT FEMORAL BONE BIOPSY;  Surgeon: Celena Sharper, MD;  Location: [facility] OR;  Service: Orthopedics;  Laterality: Right;   BONE EXCISION Right 04/11/2023    Procedure: BONE EXCISION;  Surgeon: Celena Sharper, MD;  Location: [facility] OR;  Service: Orthopedics;  Laterality: Right;   BREAST BIOPSY Left 10/2014   BREAST LUMPECTOMY Left 2016   BREAST REDUCTION SURGERY Bilateral 11/11/2014    Procedure: Bilateral Breast Reduction;  Surgeon: Alm Sick, MD;  Location: [facility] OR;  Service: Plastics;  Laterality: Bilateral;   BRONCHIAL BIOPSY   01/06/2022    Procedure: BRONCHIAL  BIOPSIES;  Surgeon: Shelah Lamar RAMAN, MD;  Location: [facility] ENDOSCOPY;  Service: Pulmonary;;   BRONCHIAL BRUSHINGS   01/06/2022    Procedure: BRONCHIAL BRUSHINGS;  Surgeon: Shelah Lamar RAMAN, MD;  Location: [facility] ENDOSCOPY;  Service: Pulmonary;;   BRONCHIAL NEEDLE ASPIRATION BIOPSY   01/06/2022    Procedure: BRONCHIAL NEEDLE ASPIRATION BIOPSIES;  Surgeon: Shelah Lamar RAMAN, MD;  Location: [facility] ENDOSCOPY;  Service: Pulmonary;;   BRONCHIAL WASHINGS   01/06/2022    Procedure: BRONCHIAL WASHINGS;  Surgeon: Shelah Lamar RAMAN, MD;  Location: [facility] ENDOSCOPY;  Service: Pulmonary;;   CARPAL TUNNEL RELEASE Bilateral      2 surgeries on right, 1 on left   CESAREAN SECTION   1978; 1980   COLONOSCOPY       FEMUR IM NAIL Right 01/04/2022    Procedure: RIGHT FEMORAL INTRAMEDULLARY (IM) NAIL;  Surgeon: Celena Sharper, MD;  Location: [facility] OR;  Service: Orthopedics;  Laterality: Right;   FIDUCIAL MARKER PLACEMENT   01/06/2022    Procedure: FIDUCIAL MARKER PLACEMENT;  Surgeon: Shelah Lamar RAMAN, MD;  Location: [facility] ENDOSCOPY;  Service: Pulmonary;;    FOREARM FRACTURE SURGERY Right 2003 X 3   FRACTURE SURGERY   2003, 2023    3 surgeries, right arm -03; Femur-23   HARDWARE REMOVAL Right 04/11/2023    Procedure: HARDWARE REMOVAL;  Surgeon: Celena Sharper, MD;  Location: [facility] OR;  Service: Orthopedics;  Laterality: Right;   KNEE ARTHROSCOPY Bilateral      meniscus repair   PARTIAL MASTECTOMY WITH NEEDLE LOCALIZATION AND AXILLARY SENTINEL LYMPH NODE BX Left 11/11/2014    Procedure: LEFT BREAST PARTIAL MASTECTOMY WITH NEEDLE LOCALIZATION TIMES TWO AND LEFT AXILLARY SENTINEL LYMPH NODE Biopsy;  Surgeon: Elon Pacini, MD;  Location: [facility] OR;  Service: General;  Laterality: Left;   REDUCTION MAMMAPLASTY Bilateral 2016   SQUAMOUS CELL CARCINOMA EXCISION   1980's X 1    nose   TONSILLECTOMY   ~ 1957   TUBAL LIGATION   ?1984            Patient Active Problem List    Diagnosis Date Noted   Metastasis to bone (HCC) 02/05/2024   Facial pain 10/23/2023   Anemia associated with stage 3 chronic renal failure (HCC) 04/18/2023   Lower extremity edema 12/21/2022   Insulin  resistance 09/13/2022   Motion sickness 01/20/2022   Visit for suture removal 01/20/2022   HLD (hyperlipidemia) 01/20/2022   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 Denver 04/21/2020   Osteopenia 10/02/2018   Malignant 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, estrogen  receptor positive (HCC) 05/29/2017   Ductal carcinoma in situ (DCIS) of right breast 05/29/2017   Vitamin D  deficiency 01/29/2016   Eczema 12/29/2014   Venous insufficiency 10/16/2014   Primary hypertension 10/01/2014   Hypercholesteremia 10/01/2014   GERD (gastroesophageal reflux disease) 10/01/2014   Obesity 10/23/2013   Obstructive sleep apnea syndrome 07/19/2013   Nodule of finger  12/24/2009   Other bilateral bundle branch block 06/23/2003      PCP: Watt Raisin MD   REFERRING PROVIDER: Timmy Maude SAUNDERS MD   REFERRING DIAG: s/p hing TKA and distal femur replacement secondary to metastasis and OA R knee   THERAPY DIAG:  Difficulty in walking, not elsewhere classified   Acute pain of right knee   Stiffness of right knee, not elsewhere classified   Muscle weakness (generalized)   Rationale for Evaluation and Treatment: Rehabilitation   ONSET DATE: 07/26/24   SUBJECTIVE:    SUBJECTIVE STATEMENT: Patient reports feeling good today resting pain 2/10 pain with some stiffness and swelling in R knee when she woke up this morning.  EVAL:  Resting pain 3/10, completed home health PT yesterday, is managing daily tasks at home well . Using walker to get around at home.  Brought wheelchair to today's appt    PERTINENT HISTORY: Extensive history with R LE orthopedics , pathologic R prox femur fx 3 years ago, has lung Ca with METS to R femur , has undergone radiation R femur, had this surgery 07/26/24.   PAIN:  Are you having pain? Yes: NPRS scale: 5/10 Pain location: R lateral thigh, infrapatellar  , medial knee Pain description: deep  Aggravating factors: movement Relieving factors: icing   PRECAUTIONS: Other: WBAT R LE, fall risk   RED FLAGS: Lung ca with METS to R femur        WEIGHT BEARING RESTRICTIONS: No   FALLS:  Has patient fallen in last 6 months? Did not ask   LIVING ENVIRONMENT: Lives with: lives with their spouse Lives in: House/apartment Stairs:  No Has following equipment at home: Vannie - 2 wheeled, Environmental Consultant - 4 wheeled, Wheelchair (manual), shower chair, and Grab bars   OCCUPATION: retired   PLOF: Independent with basic ADLs and Independent with household mobility with device   PATIENT GOALS: not reliant on wheelchair   NEXT MD VISIT: 08/14/24   OBJECTIVE:  Note: Objective measures were completed at Evaluation unless otherwise noted.   DIAGNOSTIC FINDINGS: husband provided with pics on his phone of x rays, has long rod entire length of femur, hingesd   PATIENT SURVEYS:  PSFS: THE PATIENT SPECIFIC FUNCTIONAL SCALE   Place score of 0-10 (0 = unable to perform activity and 10 = able to perform activity at the same level as before injury or problem)   Activity Date: 08/13/24      Car transfers  4      2.bed mobility 5      3.walking greater than 350' for household distance  2      4.         Total Score 11/3: 3.67          Total Score = Sum of activity scores/number of activities   Minimally Detectable Change: 3 points (for single activity); 2 points (for average score)   [facility] (nd). The Patient Specific Functional Scale . Retrieved from [email or web address]    COGNITION: Overall cognitive status: Within functional limits for tasks assessed                         SENSATION: WFL   EDEMA:  Mod edema B lower legs, R thigh and knee greater than L   MUSCLE LENGTH: Hamstrings: Right wnl deg; Left wnl deg Debby test: Right nt deg; Left nt deg   POSTURE: mod edema B Le, more pronounced R compared to L,  in standing tends to ER R hip PALPATION: Has normal R patellar mobility, some tenderness   LOWER EXTREMITY ROM:    PROM Right eval Left eval R AROM 08/19/24 R  08/23/24 R 08/26/24  Hip flexion         Hip extension         Hip abduction         Hip adduction         Hip internal rotation         Hip external rotation         Knee flexion 42    60 in sitting 65 in sitting 68 sitting  Knee extension Hyperextends 3 degrees       Ankle dorsiflexion         Ankle plantarflexion         Ankle inversion         Ankle eversion          (Blank rows = not tested)   LOWER EXTREMITY MMT:   MMT Right eval Left eval  Hip flexion 3-    Hip extension      Hip abduction 2+    Hip adduction 2+    Hip internal rotation      Hip external rotation      Knee flexion      Knee extension trace    Ankle dorsiflexion      Ankle plantarflexion 3+    Ankle inversion      Ankle eversion       (Blank rows = not tested)   LOWER EXTREMITY SPECIAL TESTS:  na   FUNCTIONAL TESTS:  Timed up and go (TUG): TBD   GAIT: Distance walked: 50' Assistive device utilized: FWW Level of assistance: SBA Comments: step through gait pattern, does stop walker briefly to advance L LE , utilizes good heel/ toe pattern                                                                                                                                  TREATMENT DATE: 08/26/24 Nustep L3x55min Seated hip ADD with ball 10x5 Seated RLAQ x 10 BLE Seated heel slides RLE x 10 Seated PF with green band x 10  Fwd and backward gait along counter top with LUE support 4x- cues to increase step length Sidestep along counter with support 3x Standing on airex: Heel raise x 10 Toe raise x 10 EC balance x Head turns and head nods x 5 each  08/23/24 THERAPEUTIC EXERCISE: To improve strength, endurance, ROM, and flexibility in.  Demonstration, verbal and tactile cues throughout for technique.  NuStep L1 x 5 min UE/LE  THERAPEUTIC ACTIVITIES: To improve functional performance.  Demonstration, verbal and tactile cues throughout for technique. Seated knee flexion on slider 2x10 RLE Standing 3 way hip 3# ankle wt x 10 each direction RLE Sidestepping holding counter 2x down and back with 3lb ankle  wt on RLE DF and PF with RTB 2x10  Seated adduction with ball  between  knee 5s hold 2x5 Supine short arc quad with towel under popliteal 2x10 RLE  SLR stretch RLE with PT assisting with hand under calf and pt holding thigh in 80 degree hip flexion x 2/5  NEUROMUSCULAR RE-EDUCATION: To improve proprioception and balance. Static standing EC and EO in each direction cervical flex, ext, rot with feet shoulder width apart Static standing on airex with PT guarding EC in neutral  Standing heel raise on airex eyes open x 10 BLE Standing toe raise on airex eyes open x 10 BLE  Vitals:  98% on RA  08/21/24 THERAPEUTIC EXERCISE: To improve strength and ROM.  Demonstration, verbal and tactile cues throughout for technique. NuStep L2 x 6' BUE/BLE  Standing 3 way hip 3# ankle wt x 10 each direction RLE Step stance on 6 step with gentle forward lunges keeping most of her weight on the LLE x 15 RLE Seated knee extension with PT assist from 60 to 0 x 2/10 RLE Seated knee flexion on slider x 20 RLE Seated knee flexion stretch on slider x 1' x 3 RLE Supine peanut ball knee flexion x 20 BLE Supine QS x 10 RLE Supine SLR w/ PT assisting w/ hand under knee and ankle x 2/5 RLE GS x 10 BLE  Added sidestepping at kitchen counter at home, but patient was too fatigued to perform in clinic today Medbridge app updated, handouts provided  08/19/24 Bike Partial rev x Seated RLE LAQ 2x10 Supine heel slide with peanut ball x 8 Supine AP with bolster under LE x 20 Supine PF RTB  legs on bolster 2 x 10 BLE Supine DF RTB legs on bolster x 10 BLE Seated RLE heel slides on slider 2x10 Nustep L2x75min UE/LE  08/16/24 THERAPEUTIC ACTIVITY:  NuStep L2 6 minutes (LE/UE) Standing 3-way hip abduction, ext, flex R LE holding counter 2x10 verbal cues to keep foot forward with abduction Standing marches R LE 2x10  Seated ankle pumps R LE on double stacked yoga blocks 1x20 Seated knee ext R LE 2x10 Seated heel slides R LE 2x20 Forward lunges on 2in step R LE 10x3'  BALANCE  TRAINING The following exercises were performed in static standing with feet shoulder width apart: Eye closed and open  1 minute  EC standing Cervical rotation 5 reps each direction EC standing Cervical flexion and extension 5 reps each direction      08/13/24  Evaluation, education throughout regarding goals Reviewed her current home program, she was able to replicate, includes seated heel slides with yoga strap,seated long arc quads with yoga strap , allowing R knee to bend and gravity assisted R knee flexion stretch. Supine glut sets, supine hip abd/ add with yoga strap,quad sets   Attempted short arc quads with pillow rolled under knee, with yoga strap assist, very painful, performed about 6 reps Also education on floating R heel with pillow due to some heel pain/ pressure overnight Adjuncted her current home program with new ex including standing heel/toe rocks, also standing 3 way hip, pushing into floor with foot on washcloth to engage proprioception and some approximation through jts        PATIENT EDUCATION:  Education details: POC, goals Person educated: Patient and Spouse Education method: Explanation, Demonstration, Tactile cues, and Verbal cues Education comprehension: verbalized understanding   HOME EXERCISE PROGRAM: Access Code: [email or web address] URL: [email or web address] Date: 08/21/2024 Prepared by: Garnette Montclair  Exercises -  Standing 3-way Hip with Walker  - 1 x daily - 7 x weekly - 2 sets - 10 reps - Standing Ankle Dorsiflexion with Table Support  - 1 x daily - 7 x weekly - 2 sets - 10 reps - Supine Short Arc Quad  - 1 x daily - 7 x weekly - 2 sets - 10 reps - Supine Hip and Knee Flexion AROM with Swiss Ball  - 1 x daily - 7 x weekly - 3 sets - 10 reps - Seated Knee Extension AAROM  - 1 x daily - 7 x weekly - 3 sets - 10 reps - Hip Flexion with Knee Extension Caregiver PROM  - 1 x daily - 7 x weekly - 3 sets - 10 reps  ASSESSMENT:   CLINICAL  IMPRESSION:  Progressed with balance activities, strengthening, and ROM exercises. Pt did have  infusion today, so a little more fatigued. She was able to tolerate the exercises w/o any increase in symptoms. She does continue to show improving R Knee flexion as measured today. PT remains necessary for gait, balance, HEP, safety, strength, ROM deficits. Continue per POC  Pt is a 74 y.o female treated for R TKA and distal femur replacement. Therapeutic exercise focusing on LE ROM today and light strengthening as we may have overworked last session. Knee flexion has improved to 60 degrees in sitting position. Pt responded well to treatment, will continue to progress as tolerated.  EVAL:  Patient is a 74 y.o. 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    OBJECTIVE IMPAIRMENTS: decreased activity tolerance, decreased balance, decreased endurance, decreased mobility, difficulty walking, decreased ROM, decreased strength, hypomobility, increased edema, impaired perceived functional ability, impaired flexibility, improper body mechanics, postural dysfunction, and pain.    ACTIVITY LIMITATIONS: carrying, lifting, bending, standing, squatting, sleeping, stairs, transfers, bed mobility, bathing, dressing, and locomotion level   PARTICIPATION LIMITATIONS: meal prep, cleaning, laundry, community activity, yard work, and church   PERSONAL FACTORS: Age, Past/current experiences, Time since onset of injury/illness/exacerbation, and 1-2 comorbidities: small cell lung disease , previous pathological fracture R prox femur, are also affecting patient's functional outcome.    REHAB POTENTIAL: Good   CLINICAL DECISION MAKING: Evolving/moderate complexity   EVALUATION COMPLEXITY: Moderate     GOALS: Goals reviewed with patient? Yes   SHORT TERM GOALS:  Target date: 4 weeks,09/11/23 I in HEP and progression Baseline:adapted today Goal status: met- 08/19/24     LONG TERM GOALS: Target date: 12 weeks, 11/05/24   Improve PSFS score from 3.67 to 7 or greater Baseline:  Goal status: INITIAL   2.  Improve R knee flexion from 42 to 90 degrees for improved function for transfers, gait, mobility Baseline:  08/23/24: see updated tables above Goal status: IN PROGRESS   3.  Improve R LE strength, hip and knee musculature to 3+/5 throughout, currently trace to 2+/5 Baseline:  Goal status: INITIAL   4.  Gait 350' at a time , without rest,with LAD for household ambulation Baseline:  Goal status: INITIAL       5.   Patient will demonstrate </= 14 sec on TUG test to reduce fall risk   Baseline:  TBD   Goal status:  INITIAL PLAN:   PT FREQUENCY: 2 x week x 1xweek, then 3 x week x 2 weeks, then 2 x week weeks 4  to 12   PT DURATION: 12 weeks   PLANNED INTERVENTIONS: 97110-Therapeutic exercises, 97530- Therapeutic activity, 97112- Neuromuscular re-education, 97535- Self Care, [id number]- Manual therapy, [phone]- Aquatic Therapy, [phone]- Electrical stimulation (manual), Patient/Family education, Balance training, Stair training, Taping, Cryotherapy, and Moist heat   PLAN FOR NEXT SESSION:  TUG, foam balance activities;  continue to work on knee flexion ROM and knee strengthening     Sol LITTIE Gaskins, PTA  08/16/2024, 10:30 am "

## 2024-08-27 ENCOUNTER — Telehealth: Payer: Self-pay | Admitting: Hematology & Oncology

## 2024-08-27 ENCOUNTER — Encounter: Payer: Self-pay | Admitting: Hematology & Oncology

## 2024-08-27 NOTE — Telephone Encounter (Signed)
 Called to schedule infusion per inbasket. LVM to return call for scheduling.

## 2024-08-27 NOTE — Therapy (Signed)
 " OUTPATIENT PHYSICAL THERAPY LOWER EXTREMITY TREATMENT     Patient Name: Laura Bush MRN: [id number] DOB:1950-10-01, 74 y.o., female Today's Date: 08/14/2024   END OF SESSION:               Past Medical History:  Diagnosis Date   Allergy      seasonal   Anemia associated with stage 3 chronic renal failure (HCC) 04/18/2023   Arthritis Last several years    Mostly in hands, wrists, shoulders, knees   Asthma      in cold weather   Breast cancer (HCC) 09/18/2014    left breast   Breast cancer (HCC) 10/07/2014    bx left breast   Breast cancer of upper-outer quadrant of left female breast (HCC) 09/22/2014   Cluster headaches      in her 40's   GERD (gastroesophageal reflux disease)     Goals of care, counseling/discussion 01/14/2022   Heart murmur      as a child (has outgrown)   High cholesterol     History of radiation therapy      Left lung, Right Femur- 02/16/22-03/04/22- Dr. Lynwood Nasuti   History of radiation therapy      Right Pelvis-12/20/23-01/03/24-Dr. Lynwood Nasuti   History of radiation therapy      Right femur-05/02/24-05/17/24- Dr. 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   Stroke [facility]) summer 2022    described as tiny   Swallowing difficulty     Venous insufficiency               Past Surgical History:  Procedure Laterality Date   ABDOMINAL HYSTERECTOMY   1999   APPENDECTOMY   ~ 2006   BONE BIOPSY Right 01/04/2022    Procedure: RIGHT FEMORAL BONE BIOPSY;  Surgeon:  Celena Sharper, MD;  Location: [facility] OR;  Service: Orthopedics;  Laterality: Right;   BONE EXCISION Right 04/11/2023    Procedure: BONE EXCISION;  Surgeon: Celena Sharper, MD;  Location: [facility] OR;  Service: Orthopedics;  Laterality: Right;   BREAST BIOPSY Left 10/2014   BREAST LUMPECTOMY Left 2016   BREAST REDUCTION SURGERY Bilateral 11/11/2014    Procedure: Bilateral Breast Reduction;  Surgeon: Alm Sick, MD;  Location: [facility] OR;  Service: Plastics;  Laterality: Bilateral;   BRONCHIAL BIOPSY   01/06/2022    Procedure: BRONCHIAL BIOPSIES;  Surgeon: Shelah Lamar RAMAN, MD;  Location: [facility] ENDOSCOPY;  Service: Pulmonary;;   BRONCHIAL BRUSHINGS   01/06/2022    Procedure: BRONCHIAL BRUSHINGS;  Surgeon: Shelah Lamar RAMAN, MD;  Location: [facility] ENDOSCOPY;  Service: Pulmonary;;   BRONCHIAL NEEDLE ASPIRATION BIOPSY   01/06/2022    Procedure: BRONCHIAL NEEDLE ASPIRATION BIOPSIES;  Surgeon: Shelah Lamar RAMAN, MD;  Location: [facility] ENDOSCOPY;  Service: Pulmonary;;   BRONCHIAL WASHINGS   01/06/2022    Procedure: BRONCHIAL WASHINGS;  Surgeon: Shelah Lamar RAMAN,  MD;  Location: [facility] ENDOSCOPY;  Service: Pulmonary;;   CARPAL TUNNEL RELEASE Bilateral      2 surgeries on right, 1 on left   CESAREAN SECTION   1978; 1980   COLONOSCOPY       FEMUR IM NAIL Right 01/04/2022    Procedure: RIGHT FEMORAL INTRAMEDULLARY (IM) NAIL;  Surgeon: Celena Sharper, MD;  Location: [facility] OR;  Service: Orthopedics;  Laterality: Right;   FIDUCIAL MARKER PLACEMENT   01/06/2022    Procedure: FIDUCIAL MARKER PLACEMENT;  Surgeon: Shelah Lamar RAMAN, MD;  Location: [facility] ENDOSCOPY;  Service: Pulmonary;;   FOREARM FRACTURE SURGERY Right 2003 X 3   FRACTURE SURGERY   2003, 2023    3 surgeries, right arm -03; Femur-23   HARDWARE REMOVAL Right 04/11/2023    Procedure: HARDWARE REMOVAL;  Surgeon: Celena Sharper, MD;  Location: [facility] OR;  Service: Orthopedics;  Laterality: Right;   KNEE ARTHROSCOPY Bilateral      meniscus repair   PARTIAL MASTECTOMY WITH NEEDLE LOCALIZATION AND  AXILLARY SENTINEL LYMPH NODE BX Left 11/11/2014    Procedure: LEFT BREAST PARTIAL MASTECTOMY WITH NEEDLE LOCALIZATION TIMES TWO AND LEFT AXILLARY SENTINEL LYMPH NODE Biopsy;  Surgeon: Elon Pacini, MD;  Location: [facility] OR;  Service: General;  Laterality: Left;   REDUCTION MAMMAPLASTY Bilateral 2016   SQUAMOUS CELL CARCINOMA EXCISION   1980's X 1    nose   TONSILLECTOMY   ~ 1957   TUBAL LIGATION   ?1984            Patient Active Problem List    Diagnosis Date Noted   Metastasis to bone (HCC) 02/05/2024   Facial pain 10/23/2023   Anemia associated with stage 3 chronic renal failure (HCC) 04/18/2023   Lower extremity edema 12/21/2022   Insulin  resistance 09/13/2022   Motion sickness 01/20/2022   Visit for suture removal 01/20/2022   HLD (hyperlipidemia) 01/20/2022   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 Denver 04/21/2020   Osteopenia 10/02/2018   Malignant 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, estrogen receptor positive (HCC) 05/29/2017   Ductal carcinoma in situ (DCIS) of right breast 05/29/2017   Vitamin D  deficiency 01/29/2016   Eczema 12/29/2014   Venous insufficiency 10/16/2014   Primary hypertension 10/01/2014   Hypercholesteremia 10/01/2014   GERD (gastroesophageal reflux disease) 10/01/2014   Obesity 10/23/2013   Obstructive sleep apnea syndrome 07/19/2013    Nodule of finger 12/24/2009   Other bilateral bundle branch block 06/23/2003      PCP: Watt Raisin MD   REFERRING PROVIDER: Timmy Maude SAUNDERS MD   REFERRING DIAG: s/p hing TKA and distal femur replacement secondary to metastasis and OA R knee   THERAPY DIAG:  Difficulty in walking, not elsewhere classified   Acute pain of right knee   Stiffness of right knee, not elsewhere classified   Muscle weakness (generalized)   Rationale for Evaluation and Treatment: Rehabilitation   ONSET DATE:  07/26/24   SUBJECTIVE:    SUBJECTIVE STATEMENT: Patient reports feeling good today resting pain 2/10 pain with some stiffness and swelling in R knee when she woke up this morning.  EVAL:  Resting pain 3/10, completed home health PT yesterday, is managing daily tasks at home well . Using walker to get around at home.  Brought wheelchair to today's appt    PERTINENT HISTORY: Extensive history with R LE orthopedics , pathologic R prox femur fx 3 years ago, has lung Ca with METS to R femur , has undergone radiation R femur, had this surgery 07/26/24.   PAIN:  Are you having pain? Yes: NPRS scale: 5/10 Pain location: R lateral thigh, infrapatellar  , medial knee Pain description: deep  Aggravating factors: movement Relieving factors: icing   PRECAUTIONS: Other: WBAT R LE, fall risk   RED FLAGS: Lung ca with METS to R femur        WEIGHT BEARING RESTRICTIONS: No   FALLS:  Has patient fallen in last 6 months? Did not ask   LIVING ENVIRONMENT: Lives with: lives with their spouse Lives in: House/apartment Stairs: No Has following equipment at home: Vannie - 2 wheeled, Environmental Consultant - 4 wheeled, Wheelchair (manual), shower chair, and Grab bars   OCCUPATION: retired   PLOF: Independent with basic ADLs and Independent with household mobility with device   PATIENT GOALS: not reliant on wheelchair   NEXT MD VISIT: 08/14/24   OBJECTIVE:  Note: Objective measures were completed at Evaluation  unless otherwise noted.   DIAGNOSTIC FINDINGS: husband provided with pics on his phone of x rays, has long rod entire length of femur, hingesd   PATIENT SURVEYS:  PSFS: THE PATIENT SPECIFIC FUNCTIONAL SCALE   Place score of 0-10 (0 = unable to perform activity and 10 = able to perform activity at the same level as before injury or problem)   Activity Date: 08/13/24      Car transfers  4      2.bed mobility 5      3.walking greater than 350' for household distance  2      4.         Total Score 11/3: 3.67          Total Score = Sum of activity scores/number of activities   Minimally Detectable Change: 3 points (for single activity); 2 points (for average score)   [facility] (nd). The Patient Specific Functional Scale . Retrieved from [email or web address]    COGNITION: Overall cognitive status: Within functional limits for tasks assessed                         SENSATION: WFL   EDEMA:  Mod edema B lower legs, R thigh and knee greater than L   MUSCLE LENGTH: Hamstrings: Right wnl deg; Left wnl deg Debby test: Right nt deg; Left nt deg   POSTURE: mod edema B Le, more pronounced R compared to L, in standing tends to ER R hip PALPATION: Has normal R patellar mobility, some tenderness   LOWER EXTREMITY ROM:    PROM Right eval Left eval R AROM 08/19/24 R  08/23/24 R 08/26/24  Hip flexion         Hip extension         Hip abduction         Hip adduction         Hip internal rotation  Hip external rotation         Knee flexion 42   60 in sitting 65 in sitting 68 sitting  Knee extension Hyperextends 3 degrees       Ankle dorsiflexion         Ankle plantarflexion         Ankle inversion         Ankle eversion          (Blank rows = not tested)   LOWER EXTREMITY MMT:   MMT Right eval Left eval  Hip flexion 3-    Hip extension      Hip abduction 2+    Hip adduction 2+    Hip internal rotation       Hip external rotation      Knee flexion      Knee extension trace    Ankle dorsiflexion      Ankle plantarflexion 3+    Ankle inversion      Ankle eversion       (Blank rows = not tested)   LOWER EXTREMITY SPECIAL TESTS:  na   FUNCTIONAL TESTS:  Timed up and go (TUG): TBD   GAIT: Distance walked: 50' Assistive device utilized: FWW Level of assistance: SBA Comments: step through gait pattern, does stop walker briefly to advance L LE , utilizes good heel/ toe pattern                                                                                                                                  TREATMENT DATE: 08/26/24 Nustep L3x29min Seated hip ADD with ball 10x5 Seated RLAQ x 10 BLE Seated heel slides RLE x 10 Seated PF with green band x 10  Fwd and backward gait along counter top with LUE support 4x- cues to increase step length Sidestep along counter with support 3x Standing on airex: Heel raise x 10 Toe raise x 10 EC balance x Head turns and head nods x 5 each  08/23/24 THERAPEUTIC EXERCISE: To improve strength, endurance, ROM, and flexibility in.  Demonstration, verbal and tactile cues throughout for technique.  NuStep L1 x 5 min UE/LE  THERAPEUTIC ACTIVITIES: To improve functional performance.  Demonstration, verbal and tactile cues throughout for technique. Seated knee flexion on slider 2x10 RLE Standing 3 way hip 3# ankle wt x 10 each direction RLE Sidestepping holding counter 2x down and back with 3lb ankle wt on RLE DF and PF with RTB 2x10  Seated adduction with ball  between knee 5s hold 2x5 Supine short arc quad with towel under popliteal 2x10 RLE  SLR stretch RLE with PT assisting with hand under calf and pt holding thigh in 80 degree hip flexion x 2/5  NEUROMUSCULAR RE-EDUCATION: To improve proprioception and balance. Static standing EC and EO in each direction cervical flex, ext, rot with feet shoulder width apart Static standing on airex with  PT guarding EC in neutral  Standing heel raise on airex eyes open x 10 BLE Standing toe raise on airex eyes open x 10 BLE  Vitals:  98% on RA  08/21/24 THERAPEUTIC EXERCISE: To improve strength and ROM.  Demonstration, verbal and tactile cues throughout for technique. NuStep L2 x 6' BUE/BLE  Standing 3 way hip 3# ankle wt x 10 each direction RLE Step stance on 6 step with gentle forward lunges keeping most of her weight on the LLE x 15 RLE Seated knee extension with PT assist from 60 to 0 x 2/10 RLE Seated knee flexion on slider x 20 RLE Seated knee flexion stretch on slider x 1' x 3 RLE Supine peanut ball knee flexion x 20 BLE Supine QS x 10 RLE Supine SLR w/ PT assisting w/ hand under knee and ankle x 2/5 RLE GS x 10 BLE  Added sidestepping at kitchen counter at home, but patient was too fatigued to perform in clinic today Medbridge app updated, handouts provided  08/19/24 Bike Partial rev x Seated RLE LAQ 2x10 Supine heel slide with peanut ball x 8 Supine AP with bolster under LE x 20 Supine PF RTB  legs on bolster 2 x 10 BLE Supine DF RTB legs on bolster x 10 BLE Seated RLE heel slides on slider 2x10 Nustep L2x68min UE/LE  08/16/24 THERAPEUTIC ACTIVITY:  NuStep L2 6 minutes (LE/UE) Standing 3-way hip abduction, ext, flex R LE holding counter 2x10 verbal cues to keep foot forward with abduction Standing marches R LE 2x10  Seated ankle pumps R LE on double stacked yoga blocks 1x20 Seated knee ext R LE 2x10 Seated heel slides R LE 2x20 Forward lunges on 2in step R LE 10x3'  BALANCE TRAINING The following exercises were performed in static standing with feet shoulder width apart: Eye closed and open  1 minute  EC standing Cervical rotation 5 reps each direction EC standing Cervical flexion and extension 5 reps each direction      08/13/24  Evaluation, education throughout regarding goals Reviewed her current home program, she was able to replicate, includes  seated heel slides with yoga strap,seated long arc quads with yoga strap , allowing R knee to bend and gravity assisted R knee flexion stretch. Supine glut sets, supine hip abd/ add with yoga strap,quad sets   Attempted short arc quads with pillow rolled under knee, with yoga strap assist, very painful, performed about 6 reps Also education on floating R heel with pillow due to some heel pain/ pressure overnight Adjuncted her current home program with new ex including standing heel/toe rocks, also standing 3 way hip, pushing into floor with foot on washcloth to engage proprioception and some approximation through jts        PATIENT EDUCATION:  Education details: POC, goals Person educated: Patient and Spouse Education method: Explanation, Demonstration, Tactile cues, and Verbal cues Education comprehension: verbalized understanding   HOME EXERCISE PROGRAM: Access Code: [email or web address] URL: [email or web address] Date: 08/21/2024 Prepared by: Garnette Montclair  Exercises - Standing 3-way Hip with Vannie  - 1 x daily - 7 x weekly - 2 sets - 10 reps - Standing Ankle Dorsiflexion with Table Support  - 1 x daily - 7 x weekly - 2 sets - 10 reps - Supine Short Arc Quad  - 1 x daily - 7 x weekly - 2 sets - 10 reps - Supine Hip and Knee Flexion AROM with Swiss Ball  - 1 x daily - 7 x weekly - 3 sets - 10 reps -  Seated Knee Extension AAROM  - 1 x daily - 7 x weekly - 3 sets - 10 reps - Hip Flexion with Knee Extension Caregiver PROM  - 1 x daily - 7 x weekly - 3 sets - 10 reps  ASSESSMENT:   CLINICAL IMPRESSION:  Progressed with balance activities, strengthening, and ROM exercises. Pt did have  infusion today, so a little more fatigued. She was able to tolerate the exercises w/o any increase in symptoms. She does continue to show improving R Knee flexion as measured today. PT remains necessary for gait, balance, HEP, safety, strength, ROM deficits. Continue per POC  Pt is a 74 y.o female  treated for R TKA and distal femur replacement. Therapeutic exercise focusing on LE ROM today and light strengthening as we may have overworked last session. Knee flexion has improved to 60 degrees in sitting position. Pt responded well to treatment, will continue to progress as tolerated.  EVAL:  Patient is a 74 y.o. 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    OBJECTIVE IMPAIRMENTS: decreased activity tolerance, decreased balance, decreased endurance, decreased mobility, difficulty walking, decreased ROM, decreased strength, hypomobility, increased edema, impaired perceived functional ability, impaired flexibility, improper body mechanics, postural dysfunction, and pain.    ACTIVITY LIMITATIONS: carrying, lifting, bending, standing, squatting, sleeping, stairs, transfers, bed mobility, bathing, dressing, and locomotion level   PARTICIPATION LIMITATIONS: meal prep, cleaning, laundry, community activity, yard work, and church   PERSONAL FACTORS: Age, Past/current experiences, Time since onset of injury/illness/exacerbation, and 1-2 comorbidities: small cell lung disease , previous pathological fracture R prox femur, are also affecting patient's functional outcome.    REHAB POTENTIAL: Good   CLINICAL DECISION MAKING: Evolving/moderate complexity   EVALUATION COMPLEXITY: Moderate     GOALS: Goals reviewed with patient? Yes   SHORT TERM GOALS: Target date: 4 weeks,09/11/23 I in HEP and progression Baseline:adapted today Goal status: met- 08/19/24     LONG TERM GOALS: Target date: 12 weeks, 11/05/24   Improve PSFS score from 3.67 to 7 or greater Baseline:  Goal status: INITIAL   2.  Improve R knee flexion from 42 to 90 degrees for improved function for transfers, gait, mobility Baseline:  08/23/24: see updated tables above Goal  status: IN PROGRESS   3.  Improve R LE strength, hip and knee musculature to 3+/5 throughout, currently trace to 2+/5 Baseline:  Goal status: INITIAL   4.  Gait 350' at a time , without rest,with LAD for household ambulation Baseline:  Goal status: INITIAL       5.   Patient will demonstrate </= 14 sec on TUG test to reduce fall risk   Baseline:  TBD   Goal status:  INITIAL PLAN:   PT FREQUENCY: 2 x week x 1xweek, then 3 x week x 2 weeks, then 2 x week weeks 4 to 12   PT DURATION: 12 weeks   PLANNED INTERVENTIONS: 97110-Therapeutic exercises, 97530- Therapeutic activity, [id number]- Neuromuscular re-education, 97535- Self Care, [id number]- Manual therapy, [id number]- Aquatic Therapy, [phone]- Electrical stimulation (manual), Patient/Family education, Balance training, Stair training, Taping, Cryotherapy, and Moist heat   PLAN FOR NEXT SESSION:  TUG, foam balance activities;  continue to work on knee flexion ROM and knee strengthening     Joshalyn Ancheta, PT  08/16/2024, 10:30 am "

## 2024-08-28 ENCOUNTER — Encounter: Payer: Self-pay | Admitting: Rehabilitation

## 2024-08-28 ENCOUNTER — Other Ambulatory Visit: Payer: Self-pay | Admitting: Hematology & Oncology

## 2024-08-28 ENCOUNTER — Other Ambulatory Visit: Payer: Self-pay

## 2024-08-28 ENCOUNTER — Ambulatory Visit: Admitting: Rehabilitation

## 2024-08-28 DIAGNOSIS — M25661 Stiffness of right knee, not elsewhere classified: Secondary | ICD-10-CM

## 2024-08-28 DIAGNOSIS — C7951 Secondary malignant neoplasm of bone: Secondary | ICD-10-CM

## 2024-08-28 DIAGNOSIS — M6281 Muscle weakness (generalized): Secondary | ICD-10-CM

## 2024-08-28 DIAGNOSIS — R262 Difficulty in walking, not elsewhere classified: Secondary | ICD-10-CM | POA: Diagnosis not present

## 2024-08-28 DIAGNOSIS — M25561 Pain in right knee: Secondary | ICD-10-CM

## 2024-08-28 MED ORDER — LORLATINIB 100 MG PO TABS
100.0000 mg | ORAL_TABLET | Freq: Every day | ORAL | 0 refills | Status: AC
  Filled 2024-08-29: qty 30, 30d supply, fill #0

## 2024-08-29 ENCOUNTER — Other Ambulatory Visit: Payer: Self-pay

## 2024-08-29 ENCOUNTER — Encounter: Payer: Self-pay | Admitting: Hematology & Oncology

## 2024-08-29 ENCOUNTER — Inpatient Hospital Stay

## 2024-08-29 VITALS — BP 118/55 | HR 66 | Temp 98.3°F | Resp 20

## 2024-08-29 DIAGNOSIS — C7951 Secondary malignant neoplasm of bone: Secondary | ICD-10-CM

## 2024-08-29 MED ORDER — SODIUM CHLORIDE 0.9 % IV SOLN: Freq: Once | INTRAVENOUS | Status: AC

## 2024-08-29 MED ORDER — IRON SUCROSE 300 MG IVPB - SIMPLE MED
300.0000 mg | Freq: Once | Status: AC
  Administered 2024-08-29: 300 mg via INTRAVENOUS
  Filled 2024-08-29: qty 300

## 2024-08-30 ENCOUNTER — Ambulatory Visit

## 2024-08-30 DIAGNOSIS — M25661 Stiffness of right knee, not elsewhere classified: Secondary | ICD-10-CM

## 2024-08-30 DIAGNOSIS — R262 Difficulty in walking, not elsewhere classified: Secondary | ICD-10-CM | POA: Diagnosis not present

## 2024-08-30 DIAGNOSIS — M6281 Muscle weakness (generalized): Secondary | ICD-10-CM

## 2024-08-30 DIAGNOSIS — M25561 Pain in right knee: Secondary | ICD-10-CM

## 2024-08-30 NOTE — Therapy (Signed)
 " OUTPATIENT PHYSICAL THERAPY LOWER EXTREMITY TREATMENT     Patient Name: Laura Bush MRN: [id number] DOB:[birth year], 74 y.o., female Today's Date: 08/14/2024   END OF SESSION:    PT End of Session - 08/30/24 1015     Visit Number 8    Date for Recertification  11/05/24    Progress Note Due on Visit 10    PT Start Time 0933    PT Stop Time 1015    PT Time Calculation (min) 42 min    Activity Tolerance Patient tolerated treatment well    Behavior During Therapy [facility] for tasks assessed/performed                     Past Medical History:  Diagnosis Date   Allergy      seasonal   Anemia associated with stage 3 chronic renal failure (HCC) 04/18/2023   Arthritis Last several years    Mostly in hands, wrists, shoulders, knees   Asthma      in cold weather   Breast cancer (HCC) 09/18/2014    left breast   Breast cancer (HCC) 10/07/2014    bx left breast   Breast cancer of upper-outer quadrant of left female breast (HCC) 09/22/2014   Cluster headaches      in her 40's   GERD (gastroesophageal reflux disease)     Goals of care, counseling/discussion 01/14/2022   Heart murmur      as a child (has outgrown)   High cholesterol     History of radiation therapy      Left lung, Right Femur- 02/16/22-03/04/22- Dr. Lynwood Nasuti   History of radiation therapy      Right Pelvis-12/20/23-01/03/24-Dr. Lynwood Nasuti   History of radiation therapy      Right femur-05/02/24-05/17/24- Dr. 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   Stroke [facility]) summer 2022    described as tiny   Swallowing difficulty     Venous insufficiency               Past Surgical History:  Procedure Laterality Date   ABDOMINAL HYSTERECTOMY   1999   APPENDECTOMY   ~ 2006   BONE BIOPSY Right 01/04/2022    Procedure: RIGHT FEMORAL BONE BIOPSY;  Surgeon: Celena Sharper, MD;  Location: [facility] OR;  Service: Orthopedics;  Laterality: Right;   BONE EXCISION Right 04/11/2023    Procedure: BONE EXCISION;  Surgeon: Celena Sharper, MD;  Location: [facility] OR;  Service: Orthopedics;  Laterality: Right;   BREAST BIOPSY Left 10/2014   BREAST LUMPECTOMY Left 2016   BREAST REDUCTION SURGERY Bilateral 11/11/2014    Procedure: Bilateral Breast Reduction;  Surgeon: Alm Sick, MD;  Location: [facility] OR;  Service: Plastics;  Laterality: Bilateral;   BRONCHIAL BIOPSY   01/06/2022    Procedure: BRONCHIAL BIOPSIES;  Surgeon: Shelah Lamar RAMAN,  MD;  Location: [facility] ENDOSCOPY;  Service: Pulmonary;;   BRONCHIAL BRUSHINGS   01/06/2022    Procedure: BRONCHIAL BRUSHINGS;  Surgeon: Shelah Lamar RAMAN, MD;  Location: [facility] ENDOSCOPY;  Service: Pulmonary;;   BRONCHIAL NEEDLE ASPIRATION BIOPSY   01/06/2022    Procedure: BRONCHIAL NEEDLE ASPIRATION BIOPSIES;  Surgeon: Shelah Lamar RAMAN, MD;  Location: [facility] ENDOSCOPY;  Service: Pulmonary;;   BRONCHIAL WASHINGS   01/06/2022    Procedure: BRONCHIAL WASHINGS;  Surgeon: Shelah Lamar RAMAN, MD;  Location: [facility] ENDOSCOPY;  Service: Pulmonary;;   CARPAL TUNNEL RELEASE Bilateral      2 surgeries on right, 1 on left   CESAREAN SECTION   1978; 1980   COLONOSCOPY       FEMUR IM NAIL Right 01/04/2022    Procedure: RIGHT FEMORAL INTRAMEDULLARY (IM) NAIL;  Surgeon: Celena Sharper, MD;  Location: [facility] OR;  Service: Orthopedics;  Laterality: Right;   FIDUCIAL MARKER PLACEMENT   01/06/2022    Procedure: FIDUCIAL MARKER PLACEMENT;  Surgeon: Shelah Lamar RAMAN, MD;  Location: [facility] ENDOSCOPY;  Service: Pulmonary;;   FOREARM FRACTURE SURGERY Right 2003  X 3   FRACTURE SURGERY   2003, 2023    3 surgeries, right arm -03; Femur-23   HARDWARE REMOVAL Right 04/11/2023    Procedure: HARDWARE REMOVAL;  Surgeon: Celena Sharper, MD;  Location: [facility] OR;  Service: Orthopedics;  Laterality: Right;   KNEE ARTHROSCOPY Bilateral      meniscus repair   PARTIAL MASTECTOMY WITH NEEDLE LOCALIZATION AND AXILLARY SENTINEL LYMPH NODE BX Left 11/11/2014    Procedure: LEFT BREAST PARTIAL MASTECTOMY WITH NEEDLE LOCALIZATION TIMES TWO AND LEFT AXILLARY SENTINEL LYMPH NODE Biopsy;  Surgeon: Elon Pacini, MD;  Location: [facility] OR;  Service: General;  Laterality: Left;   REDUCTION MAMMAPLASTY Bilateral 2016   SQUAMOUS CELL CARCINOMA EXCISION   1980's X 1    nose   TONSILLECTOMY   ~ 1957   TUBAL LIGATION   ?1984            Patient Active Problem List    Diagnosis Date Noted   Metastasis to bone (HCC) 02/05/2024   Facial pain 10/23/2023   Anemia associated with stage 3 chronic renal failure (HCC) 04/18/2023   Lower extremity edema 12/21/2022   Insulin  resistance 09/13/2022   Motion sickness 01/20/2022   Visit for suture removal 01/20/2022   HLD (hyperlipidemia) 01/20/2022   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 Denver 04/21/2020   Osteopenia 10/02/2018   Malignant 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, estrogen receptor positive (HCC) 05/29/2017    Ductal carcinoma in situ (DCIS) of right breast 05/29/2017   Vitamin D  deficiency 01/29/2016   Eczema 12/29/2014   Venous insufficiency 10/16/2014   Primary hypertension 10/01/2014   Hypercholesteremia 10/01/2014   GERD (gastroesophageal reflux disease) 10/01/2014   Obesity 10/23/2013   Obstructive sleep apnea syndrome 07/19/2013   Nodule of finger 12/24/2009   Other bilateral bundle  branch block 06/23/2003      PCP: Watt Raisin MD   REFERRING PROVIDER: Timmy Maude SAUNDERS MD   REFERRING DIAG: s/p hing TKA and distal femur replacement secondary to metastasis and OA R knee   THERAPY DIAG:  Difficulty in walking, not elsewhere classified   Acute pain of right knee   Stiffness of right knee, not elsewhere classified   Muscle weakness (generalized)   Rationale for Evaluation and Treatment: Rehabilitation   ONSET DATE: 07/26/24   SUBJECTIVE:    SUBJECTIVE STATEMENT: Patient denies pain today, session went well last time, just a little tired today  EVAL:  Resting pain 3/10, completed home health PT yesterday, is managing daily tasks at home well . Using walker to get around at home.  Brought wheelchair to today's appt    PERTINENT HISTORY: Extensive history with R LE orthopedics , pathologic R prox femur fx 3 years ago, has lung Ca with METS to R femur , has undergone radiation R femur, had this surgery 07/26/24.   PAIN:  Are you having pain? Yes: NPRS scale: 0/10 Pain location: R lateral thigh, infrapatellar  , medial knee Pain description: deep  Aggravating factors: movement Relieving factors: icing   PRECAUTIONS: Other: WBAT R LE, fall risk   RED FLAGS: Lung ca with METS to R femur        WEIGHT BEARING RESTRICTIONS: No   FALLS:  Has patient fallen in last 6 months? Did not ask   LIVING ENVIRONMENT: Lives with: lives with their spouse Lives in: House/apartment Stairs: No Has following equipment at home: Vannie - 2 wheeled, Environmental Consultant - 4 wheeled, Wheelchair  (manual), shower chair, and Grab bars   OCCUPATION: retired   PLOF: Independent with basic ADLs and Independent with household mobility with device   PATIENT GOALS: not reliant on wheelchair   NEXT MD VISIT: 08/14/24   OBJECTIVE:  Note: Objective measures were completed at Evaluation unless otherwise noted.   DIAGNOSTIC FINDINGS: husband provided with pics on his phone of x rays, has long rod entire length of femur, hingesd   PATIENT SURVEYS:  PSFS: THE PATIENT SPECIFIC FUNCTIONAL SCALE   Place score of 0-10 (0 = unable to perform activity and 10 = able to perform activity at the same level as before injury or problem)   Activity Date: 08/13/24      Car transfers  4      2.bed mobility 5      3.walking greater than 350' for household distance  2      4.         Total Score 11/3: 3.67          Total Score = Sum of activity scores/number of activities   Minimally Detectable Change: 3 points (for single activity); 2 points (for average score)   [facility] (nd). The Patient Specific Functional Scale . Retrieved from [email or web address]    COGNITION: Overall cognitive status: Within functional limits for tasks assessed                         SENSATION: WFL   EDEMA:  Mod edema B lower legs, R thigh and knee greater than L   MUSCLE LENGTH: Hamstrings: Right wnl deg; Left wnl deg Debby test: Right nt deg; Left nt deg   POSTURE: mod edema B Le, more pronounced R compared to L, in standing tends to ER R hip PALPATION: Has normal R patellar mobility, some tenderness  LOWER EXTREMITY ROM:    PROM Right eval Left eval R AROM 08/19/24 R  08/23/24 R 08/26/24  Hip flexion         Hip extension         Hip abduction         Hip adduction         Hip internal rotation         Hip external rotation         Knee flexion 42   60 in sitting 65 in sitting 68 sitting  Knee extension Hyperextends 3 degrees        Ankle dorsiflexion         Ankle plantarflexion         Ankle inversion         Ankle eversion          (Blank rows = not tested)   LOWER EXTREMITY MMT:   MMT Right eval Left eval  Hip flexion 3-    Hip extension      Hip abduction 2+    Hip adduction 2+    Hip internal rotation      Hip external rotation      Knee flexion      Knee extension trace    Ankle dorsiflexion      Ankle plantarflexion 3+    Ankle inversion      Ankle eversion       (Blank rows = not tested)   LOWER EXTREMITY SPECIAL TESTS:  na   FUNCTIONAL TESTS:  Timed up and go (TUG): TBD   GAIT: Distance walked: 34' Assistive device utilized: FWW Level of assistance: SBA Comments: step through gait pattern, does stop walker briefly to advance L LE , utilizes good heel/ toe pattern                                                                                                                                  TREATMENT DATE: 08/30/24 Gait 3 laps 270 around the clinic with her RW- supervision Seated RLE x 20 Nustep L3x20min UE/LE Seated R HS curls YTB 2 x 10 Seated RLE LAQ 1lb x 10 Seated RLE marching 1lb x 10  Clock LE reaches 12 to 6 o'clock R/L x 5; rest needed after 2x standing on RLE Foam airex:  Toe raises x 20 BLE  Heel raises x 20 BLE  Marching x 20 RLE only  Step up onto airex with RLE x 7 with UE support Standing R knee flexion stretch on 6 step 10x5 sec   08/28/24 THERAPEUTIC EXERCISE: To improve strength, endurance, ROM, and flexibility in.  Demonstration, verbal and tactile cues throughout for technique.  NuStep L3 x 6 min UE/LE  THERAPEUTIC ACTIVITIES: To improve functional performance.  Demonstration, verbal and tactile cues throughout for technique. TUG x 3 trials = 18 sec 6 step stance forward lunges gently on LLE x 20 SLR AAROM  w/ PT assist x 2/10 RLE Supine QS x 20 RLE SL hip abd AAROM with PT assist x 3/5 RLE SKTC knee flexion stretch w/ RLE on PT shoulder with  assisted/supported knee flexion x 1' x 3  (I think she is able to get more than 60 degrees knee flexion, but can't really measure or see it clearly as I assist her with holding the position)   NEUROMUSCULAR RE-EDUCATION: To improve posture, proprioception, and balance. Foam airex:  Toe raises x 15 BLE  Heel raises x 15 BLE  Marching x 15 RLE only  Static balance EC x 1'      08/26/24 Nustep L3x28min Seated hip ADD with ball 10x5 Seated RLAQ x 10 BLE Seated heel slides RLE x 10 Seated PF with green band x 10  Fwd and backward gait along counter top with LUE support 4x- cues to increase step length Sidestep along counter with support 3x Standing on airex: Heel raise x 10 Toe raise x 10 EC balance x Head turns and head nods x 5 each  08/23/24 THERAPEUTIC EXERCISE: To improve strength, endurance, ROM, and flexibility in.  Demonstration, verbal and tactile cues throughout for technique.  NuStep L1 x 5 min UE/LE  THERAPEUTIC ACTIVITIES: To improve functional performance.  Demonstration, verbal and tactile cues throughout for technique. Seated knee flexion on slider 2x10 RLE Standing 3 way hip 3# ankle wt x 10 each direction RLE Sidestepping holding counter 2x down and back with 3lb ankle wt on RLE DF and PF with RTB 2x10  Seated adduction with ball  between knee 5s hold 2x5 Supine short arc quad with towel under popliteal 2x10 RLE  SLR stretch RLE with PT assisting with hand under calf and pt holding thigh in 80 degree hip flexion x 2/5  NEUROMUSCULAR RE-EDUCATION: To improve proprioception and balance. Static standing EC and EO in each direction cervical flex, ext, rot with feet shoulder width apart Static standing on airex with PT guarding EC in neutral  Standing heel raise on airex eyes open x 10 BLE Standing toe raise on airex eyes open x 10 BLE  Vitals:  98% on RA  08/21/24 THERAPEUTIC EXERCISE: To improve strength and ROM.  Demonstration, verbal and tactile cues  throughout for technique. NuStep L2 x 6' BUE/BLE  Standing 3 way hip 3# ankle wt x 10 each direction RLE Step stance on 6 step with gentle forward lunges keeping most of her weight on the LLE x 15 RLE Seated knee extension with PT assist from 60 to 0 x 2/10 RLE Seated knee flexion on slider x 20 RLE Seated knee flexion stretch on slider x 1' x 3 RLE Supine peanut ball knee flexion x 20 BLE Supine QS x 10 RLE Supine SLR w/ PT assisting w/ hand under knee and ankle x 2/5 RLE GS x 10 BLE  Added sidestepping at kitchen counter at home, but patient was too fatigued to perform in clinic today Medbridge app updated, handouts provided  08/19/24 Bike Partial rev x Seated RLE LAQ 2x10 Supine heel slide with peanut ball x 8 Supine AP with bolster under LE x 20 Supine PF RTB  legs on bolster 2 x 10 BLE Supine DF RTB legs on bolster x 10 BLE Seated RLE heel slides on slider 2x10 Nustep L2x73min UE/LE  08/16/24 THERAPEUTIC ACTIVITY:  NuStep L2 6 minutes (LE/UE) Standing 3-way hip abduction, ext, flex R LE holding counter 2x10 verbal cues to keep foot forward with abduction Standing  marches R LE 2x10  Seated ankle pumps R LE on double stacked yoga blocks 1x20 Seated knee ext R LE 2x10 Seated heel slides R LE 2x20 Forward lunges on 2in step R LE 10x3'  BALANCE TRAINING The following exercises were performed in static standing with feet shoulder width apart: Eye closed and open  1 minute  EC standing Cervical rotation 5 reps each direction EC standing Cervical flexion and extension 5 reps each direction      08/13/24  Evaluation, education throughout regarding goals Reviewed her current home program, she was able to replicate, includes seated heel slides with yoga strap,seated long arc quads with yoga strap , allowing R knee to bend and gravity assisted R knee flexion stretch. Supine glut sets, supine hip abd/ add with yoga strap,quad sets   Attempted short arc quads with pillow  rolled under knee, with yoga strap assist, very painful, performed about 6 reps Also education on floating R heel with pillow due to some heel pain/ pressure overnight Adjuncted her current home program with new ex including standing heel/toe rocks, also standing 3 way hip, pushing into floor with foot on washcloth to engage proprioception and some approximation through jts        PATIENT EDUCATION:  Education details: POC, goals Person educated: Patient and Spouse Education method: Explanation, Demonstration, Tactile cues, and Verbal cues Education comprehension: verbalized understanding   HOME EXERCISE PROGRAM: Access Code: [email or web address] URL: [email or web address] Date: 08/21/2024 Prepared by: Garnette Montclair  Exercises - Standing 3-way Hip with Vannie  - 1 x daily - 7 x weekly - 2 sets - 10 reps - Standing Ankle Dorsiflexion with Table Support  - 1 x daily - 7 x weekly - 2 sets - 10 reps - Supine Short Arc Quad  - 1 x daily - 7 x weekly - 2 sets - 10 reps - Supine Hip and Knee Flexion AROM with Swiss Ball  - 1 x daily - 7 x weekly - 3 sets - 10 reps - Seated Knee Extension AAROM  - 1 x daily - 7 x weekly - 3 sets - 10 reps - Hip Flexion with Knee Extension Caregiver PROM  - 1 x daily - 7 x weekly - 3 sets - 10 reps  ASSESSMENT:   CLINICAL IMPRESSION:  Pt was able to complete the interventions, however noted fatigue after some of the standing ex and some buckling on R knee with the clock balancing. We were able to lightly progress some of her strengthening for quads and hamstrings. Cues were required with standing marches to avoid hip ER. PT remains necessary for strength, gait, balance, and knee ROM deficits.   Continue per POC  Pt is a 74 y.o female treated for R TKA and distal femur replacement. Therapeutic exercise focusing on LE ROM today and light strengthening as we may have overworked last session. Knee flexion has improved to 60 degrees in sitting position. Pt  responded well to treatment, will continue to progress as tolerated.  EVAL:  Patient is a 74 y.o. 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    OBJECTIVE IMPAIRMENTS: decreased activity tolerance, decreased balance, decreased endurance, decreased mobility, difficulty walking, decreased ROM, decreased strength, hypomobility, increased edema, impaired perceived functional ability, impaired flexibility, improper body mechanics, postural dysfunction, and pain.    ACTIVITY LIMITATIONS: carrying, lifting, bending, standing,  squatting, sleeping, stairs, transfers, bed mobility, bathing, dressing, and locomotion level   PARTICIPATION LIMITATIONS: meal prep, cleaning, laundry, community activity, yard work, and church   PERSONAL FACTORS: Age, Past/current experiences, Time since onset of injury/illness/exacerbation, and 1-2 comorbidities: small cell lung disease , previous pathological fracture R prox femur, are also affecting patient's functional outcome.    REHAB POTENTIAL: Good   CLINICAL DECISION MAKING: Evolving/moderate complexity   EVALUATION COMPLEXITY: Moderate     GOALS: Goals reviewed with patient? Yes   SHORT TERM GOALS: Target date: 4 weeks,09/11/23 I in HEP and progression Baseline:adapted today Goal status: met- 08/19/24     LONG TERM GOALS: Target date: 12 weeks, 11/05/24   Improve PSFS score from 3.67 to 7 or greater Baseline:  Goal status: INITIAL   2.  Improve R knee flexion from 42 to 90 degrees for improved function for transfers, gait, mobility Baseline:  08/23/24: see updated tables above Goal status: IN PROGRESS   3.  Improve R LE strength, hip and knee musculature to 3+/5 throughout, currently trace to 2+/5 Baseline:  Goal status: INITIAL   4.  Gait 350' at a time , without rest,with LAD for household  ambulation Baseline:  Goal status: INITIAL       5.   Patient will demonstrate </= 14 sec on TUG test to reduce fall risk   Baseline:  08/28/24 = 18 sec   Goal status:  INITIAL PLAN:   PT FREQUENCY: 2 x week x 1xweek, then 3 x week x 2 weeks, then 2 x week weeks 4 to 12   PT DURATION: 12 weeks   PLANNED INTERVENTIONS: 97110-Therapeutic exercises, 97530- Therapeutic activity, 97112- Neuromuscular re-education, 97535- Self Care, [id number]- Manual therapy, [phone]- Aquatic Therapy, [phone]- Electrical stimulation (manual), Patient/Family education, Balance training, Stair training, Taping, Cryotherapy, and Moist heat   PLAN FOR NEXT SESSION:   continue to work on knee extension strength and knee flexion ROM and knee strengthening     Isrrael Fluckiger L Syreeta Figler, PTA  08/16/2024, 10:30 am "

## 2024-09-01 ENCOUNTER — Other Ambulatory Visit (HOSPITAL_COMMUNITY): Payer: Self-pay

## 2024-09-02 ENCOUNTER — Other Ambulatory Visit (HOSPITAL_COMMUNITY): Payer: Self-pay

## 2024-09-02 ENCOUNTER — Encounter (HOSPITAL_BASED_OUTPATIENT_CLINIC_OR_DEPARTMENT_OTHER): Payer: Self-pay | Admitting: Pharmacist Clinician (PhC)/ Clinical Pharmacy Specialist

## 2024-09-03 ENCOUNTER — Ambulatory Visit

## 2024-09-03 DIAGNOSIS — M6281 Muscle weakness (generalized): Secondary | ICD-10-CM

## 2024-09-03 DIAGNOSIS — R262 Difficulty in walking, not elsewhere classified: Secondary | ICD-10-CM | POA: Diagnosis not present

## 2024-09-03 DIAGNOSIS — M25661 Stiffness of right knee, not elsewhere classified: Secondary | ICD-10-CM

## 2024-09-03 NOTE — Therapy (Addendum)
 " OUTPATIENT PHYSICAL THERAPY LOWER EXTREMITY TREATMENT     Patient Name: Laura Bush MRN: [id number] DOB:11-Feb-1951, 74 y.o., female Today's Date: 08/14/2024   END OF SESSION:    PT End of Session - 09/03/24 0001     Visit Number 9    Date for Recertification  11/05/24    Progress Note Due on Visit 10    PT Start Time 1145    PT Stop Time 1235    PT Time Calculation (min) 50 min    Activity Tolerance Patient tolerated treatment well    Behavior During Therapy [facility] for tasks assessed/performed                      Past Medical History:  Diagnosis Date   Allergy      seasonal   Anemia associated with stage 3 chronic renal failure (HCC) 04/18/2023   Arthritis Last several years    Mostly in hands, wrists, shoulders, knees   Asthma      in cold weather   Breast cancer (HCC) 09/18/2014    left breast   Breast cancer (HCC) 10/07/2014    bx left breast   Breast cancer of upper-outer quadrant of left female breast (HCC) 09/22/2014   Cluster headaches      in her 40's   GERD (gastroesophageal reflux disease)     Goals of care, counseling/discussion 01/14/2022   Heart murmur      as a child (has outgrown)   High cholesterol     History of radiation therapy      Left lung, Right Femur- 02/16/22-03/04/22- Dr. Lynwood Nasuti   History of radiation therapy      Right Pelvis-12/20/23-01/03/24-Dr. Lynwood Nasuti   History of radiation therapy      Right femur-05/02/24-05/17/24- Dr. 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   Stroke [facility]) summer 2022    described as tiny   Swallowing difficulty     Venous insufficiency               Past Surgical History:  Procedure Laterality Date   ABDOMINAL HYSTERECTOMY   1999   APPENDECTOMY   ~ 2006   BONE BIOPSY Right 01/04/2022    Procedure: RIGHT FEMORAL BONE BIOPSY;  Surgeon: Celena Sharper, MD;  Location: [facility] OR;  Service: Orthopedics;  Laterality: Right;   BONE EXCISION Right 04/11/2023    Procedure: BONE EXCISION;  Surgeon: Celena Sharper, MD;  Location: [facility] OR;  Service: Orthopedics;  Laterality: Right;   BREAST BIOPSY Left 10/2014   BREAST LUMPECTOMY Left 2016   BREAST REDUCTION SURGERY Bilateral 11/11/2014    Procedure: Bilateral Breast Reduction;  Surgeon: Alm Sick, MD;  Location: [facility] OR;  Service: Plastics;  Laterality: Bilateral;   BRONCHIAL BIOPSY   01/06/2022    Procedure: BRONCHIAL BIOPSIES;  Surgeon: Shelah Charleston  S, MD;  Location: [facility] ENDOSCOPY;  Service: Pulmonary;;   BRONCHIAL BRUSHINGS   01/06/2022    Procedure: BRONCHIAL BRUSHINGS;  Surgeon: Shelah Lamar RAMAN, MD;  Location: [facility] ENDOSCOPY;  Service: Pulmonary;;   BRONCHIAL NEEDLE ASPIRATION BIOPSY   01/06/2022    Procedure: BRONCHIAL NEEDLE ASPIRATION BIOPSIES;  Surgeon: Shelah Lamar RAMAN, MD;  Location[facility] ENDOSCOPY;  Service: Pulmonary;;   BRONCHIAL WASHINGS   01/06/2022    Procedure: BRONCHIAL WASHINGS;  Surgeon: Shelah Lamar RAMAN, MD;  Location: [facility] ENDOSCOPY;  Service: Pulmonary;;   CARPAL TUNNEL RELEASE Bilateral      2 surgeries on right, 1 on left   CESAREAN SECTION   1978; 1980   COLONOSCOPY       FEMUR IM NAIL Right 01/04/2022    Procedure: RIGHT FEMORAL INTRAMEDULLARY (IM) NAIL;  Surgeon: Celena Sharper, MD;  Location: [facility] OR;  Service: Orthopedics;  Laterality: Right;   FIDUCIAL MARKER PLACEMENT   01/06/2022    Procedure: FIDUCIAL MARKER PLACEMENT;  Surgeon: Shelah Lamar RAMAN, MD;  Location: [facility] ENDOSCOPY;  Service: Pulmonary;;   FOREARM FRACTURE SURGERY  Right 2003 X 3   FRACTURE SURGERY   2003, 2023    3 surgeries, right arm -03; Femur-23   HARDWARE REMOVAL Right 04/11/2023    Procedure: HARDWARE REMOVAL;  Surgeon: Celena Sharper, MD;  Location: [facility] OR;  Service: Orthopedics;  Laterality: Right;   KNEE ARTHROSCOPY Bilateral      meniscus repair   PARTIAL MASTECTOMY WITH NEEDLE LOCALIZATION AND AXILLARY SENTINEL LYMPH NODE BX Left 11/11/2014    Procedure: LEFT BREAST PARTIAL MASTECTOMY WITH NEEDLE LOCALIZATION TIMES TWO AND LEFT AXILLARY SENTINEL LYMPH NODE Biopsy;  Surgeon: Elon Pacini, MD;  Location: [facility] OR;  Service: General;  Laterality: Left;   REDUCTION MAMMAPLASTY Bilateral 2016   SQUAMOUS CELL CARCINOMA EXCISION   1980's X 1    nose   TONSILLECTOMY   ~ 1957   TUBAL LIGATION   ?1984            Patient Active Problem List    Diagnosis Date Noted   Metastasis to bone (HCC) 02/05/2024   Facial pain 10/23/2023   Anemia associated with stage 3 chronic renal failure (HCC) 04/18/2023   Lower extremity edema 12/21/2022   Insulin  resistance 09/13/2022   Motion sickness 01/20/2022   Visit for suture removal 01/20/2022   HLD (hyperlipidemia) 01/20/2022   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 Denver 04/21/2020   Osteopenia 10/02/2018   Malignant 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, estrogen receptor positive (HCC)  05/29/2017   Ductal carcinoma in situ (DCIS) of right breast 05/29/2017   Vitamin D  deficiency 01/29/2016   Eczema 12/29/2014   Venous insufficiency 10/16/2014   Primary hypertension 10/01/2014   Hypercholesteremia 10/01/2014   GERD (gastroesophageal reflux disease) 10/01/2014   Obesity 10/23/2013   Obstructive sleep apnea syndrome 07/19/2013   Nodule of finger 12/24/2009   Other bilateral  bundle branch block 06/23/2003      PCP: Watt Raisin MD   REFERRING PROVIDER: Timmy Maude SAUNDERS MD   REFERRING DIAG: s/p hing TKA and distal femur replacement secondary to metastasis and OA R knee   THERAPY DIAG:  Difficulty in walking, not elsewhere classified   Acute pain of right knee   Stiffness of right knee, not elsewhere classified   Muscle weakness (generalized)   Rationale for Evaluation and Treatment: Rehabilitation   ONSET DATE: 07/26/24   SUBJECTIVE:    SUBJECTIVE STATEMENT: Patient denies any pain today and is experiencing stiffness and swelling in R LE from thigh to ankle. Using walker in clinic today.  EVAL:  Resting pain 3/10, completed home health PT yesterday, is managing daily tasks at home well . Using walker to get around at home.  Brought wheelchair to today's appt    PERTINENT HISTORY: Extensive history with R LE orthopedics , pathologic R prox femur fx 3 years ago, has lung Ca with METS to R femur , has undergone radiation R femur, had this surgery 07/26/24.   PAIN:  Are you having pain? Yes: NPRS scale: 0/10 Pain location: R lateral thigh, infrapatellar  , medial knee Pain description: deep  Aggravating factors: movement Relieving factors: icing   PRECAUTIONS: Other: WBAT R LE, fall risk   RED FLAGS: Lung ca with METS to R femur        WEIGHT BEARING RESTRICTIONS: No   FALLS:  Has patient fallen in last 6 months? Did not ask   LIVING ENVIRONMENT: Lives with: lives with their spouse Lives in: House/apartment Stairs: No Has following  equipment at home: Vannie - 2 wheeled, Environmental Consultant - 4 wheeled, Wheelchair (manual), shower chair, and Grab bars   OCCUPATION: retired   PLOF: Independent with basic ADLs and Independent with household mobility with device   PATIENT GOALS: not reliant on wheelchair   NEXT MD VISIT: 08/14/24   OBJECTIVE:  Note: Objective measures were completed at Evaluation unless otherwise noted.   DIAGNOSTIC FINDINGS: husband provided with pics on his phone of x rays, has long rod entire length of femur, hingesd   PATIENT SURVEYS:  PSFS: THE PATIENT SPECIFIC FUNCTIONAL SCALE   Place score of 0-10 (0 = unable to perform activity and 10 = able to perform activity at the same level as before injury or problem)   Activity Date: 08/13/24      Car transfers  4      2.bed mobility 5      3.walking greater than 350' for household distance  2      4.         Total Score 11/3: 3.67          Total Score = Sum of activity scores/number of activities   Minimally Detectable Change: 3 points (for single activity); 2 points (for average score)   [facility] (nd). The Patient Specific Functional Scale . Retrieved from [email or web address]    COGNITION: Overall cognitive status: Within functional limits for tasks assessed                         SENSATION: WFL   EDEMA:  Mod edema B lower legs, R thigh and knee greater than L   MUSCLE LENGTH: Hamstrings: Right wnl deg; Left wnl deg Debby test: Right nt deg; Left nt deg   POSTURE: mod edema B Le, more pronounced R compared to L, in standing tends to ER  R hip PALPATION: Has normal R patellar mobility, some tenderness   LOWER EXTREMITY ROM:    PROM Right eval Left eval R AROM 08/19/24 R  08/23/24 R 08/26/24  Hip flexion         Hip extension         Hip abduction         Hip adduction         Hip internal rotation         Hip external rotation         Knee flexion 42   60 in sitting 65  in sitting 68 sitting  Knee extension Hyperextends 3 degrees       Ankle dorsiflexion         Ankle plantarflexion         Ankle inversion         Ankle eversion          (Blank rows = not tested)   LOWER EXTREMITY MMT:   MMT Right eval Left eval R 09/03/24  Hip flexion 3-   4+  Hip extension       Hip abduction 2+     Hip adduction 2+     Hip internal rotation       Hip external rotation       Knee flexion       Knee extension trace     Ankle dorsiflexion       Ankle plantarflexion 3+     Ankle inversion       Ankle eversion        (Blank rows = not tested)   LOWER EXTREMITY SPECIAL TESTS:  na   FUNCTIONAL TESTS:  Timed up and go (TUG): TBD   GAIT: Distance walked: 40' Assistive device utilized: FWW Level of assistance: SBA Comments: step through gait pattern, does stop walker briefly to advance L LE , utilizes good heel/ toe pattern                                                                                                                                  TREATMENT DATE: 09/03/24 Seated heel slides RLE x 25 Seated RLE LAQ 2x10 HS curls YTB RLE 2x10 Assessed Edema Circumference R 22in and L16.5in  Standing wit walker:  3-way hip each direction 2x10  Toe raises 2x10  Heel raises 2x10  Game Ready vasopneumatic compression post session to R LE quads, hamstrings, and knee x 10 min, low compression, 34 to reduce post-exercise pain and swelling/edema   08/30/24 Gait 3 laps 270 around the clinic with her RW- supervision Seated RLE x 20 Nustep L3x45min UE/LE Seated R HS curls YTB 2 x 10 Seated RLE LAQ 1lb x 10 Seated RLE marching 1lb x 10  Clock LE reaches 12 to 6 o'clock R/L x 5; rest needed after 2x standing on RLE Foam airex:  Toe raises x 20 BLE  Heel raises  x 20 BLE  Marching x 20 RLE only  Step up onto airex with RLE x 7 with UE support Standing R knee flexion stretch on 6 step 10x5 sec   08/28/24 THERAPEUTIC EXERCISE: To improve strength,  endurance, ROM, and flexibility in.  Demonstration, verbal and tactile cues throughout for technique.  NuStep L3 x 6 min UE/LE  THERAPEUTIC ACTIVITIES: To improve functional performance.  Demonstration, verbal and tactile cues throughout for technique. TUG x 3 trials = 18 sec 6 step stance forward lunges gently on LLE x 20 SLR AAROM w/ PT assist x 2/10 RLE Supine QS x 20 RLE SL hip abd AAROM with PT assist x 3/5 RLE SKTC knee flexion stretch w/ RLE on PT shoulder with assisted/supported knee flexion x 1' x 3  (I think she is able to get more than 60 degrees knee flexion, but can't really measure or see it clearly as I assist her with holding the position)   NEUROMUSCULAR RE-EDUCATION: To improve posture, proprioception, and balance. Foam airex:  Toe raises x 15 BLE  Heel raises x 15 BLE  Marching x 15 RLE only  Static balance EC x 1'      08/26/24 Nustep L3x51min Seated hip ADD with ball 10x5 Seated RLAQ x 10 BLE Seated heel slides RLE x 10 Seated PF with green band x 10  Fwd and backward gait along counter top with LUE support 4x- cues to increase step length Sidestep along counter with support 3x Standing on airex: Heel raise x 10 Toe raise x 10 EC balance x Head turns and head nods x 5 each  08/23/24 THERAPEUTIC EXERCISE: To improve strength, endurance, ROM, and flexibility in.  Demonstration, verbal and tactile cues throughout for technique.  NuStep L1 x 5 min UE/LE  THERAPEUTIC ACTIVITIES: To improve functional performance.  Demonstration, verbal and tactile cues throughout for technique. Seated knee flexion on slider 2x10 RLE Standing 3 way hip 3# ankle wt x 10 each direction RLE Sidestepping holding counter 2x down and back with 3lb ankle wt on RLE DF and PF with RTB 2x10  Seated adduction with ball  between knee 5s hold 2x5 Supine short arc quad with towel under popliteal 2x10 RLE  SLR stretch RLE with PT assisting with hand under calf and pt holding  thigh in 80 degree hip flexion x 2/5  NEUROMUSCULAR RE-EDUCATION: To improve proprioception and balance. Static standing EC and EO in each direction cervical flex, ext, rot with feet shoulder width apart Static standing on airex with PT guarding EC in neutral  Standing heel raise on airex eyes open x 10 BLE Standing toe raise on airex eyes open x 10 BLE  Vitals:  98% on RA  08/21/24 THERAPEUTIC EXERCISE: To improve strength and ROM.  Demonstration, verbal and tactile cues throughout for technique. NuStep L2 x 6' BUE/BLE  Standing 3 way hip 3# ankle wt x 10 each direction RLE Step stance on 6 step with gentle forward lunges keeping most of her weight on the LLE x 15 RLE Seated knee extension with PT assist from 60 to 0 x 2/10 RLE Seated knee flexion on slider x 20 RLE Seated knee flexion stretch on slider x 1' x 3 RLE Supine peanut ball knee flexion x 20 BLE Supine QS x 10 RLE Supine SLR w/ PT assisting w/ hand under knee and ankle x 2/5 RLE GS x 10 BLE  Added sidestepping at kitchen counter at home, but patient was too fatigued to perform  in clinic today Medbridge app updated, handouts provided  08/19/24 Bike Partial rev x Seated RLE LAQ 2x10 Supine heel slide with peanut ball x 8 Supine AP with bolster under LE x 20 Supine PF RTB  legs on bolster 2 x 10 BLE Supine DF RTB legs on bolster x 10 BLE Seated RLE heel slides on slider 2x10 Nustep L2x35min UE/LE  08/16/24 THERAPEUTIC ACTIVITY:  NuStep L2 6 minutes (LE/UE) Standing 3-way hip abduction, ext, flex R LE holding counter 2x10 verbal cues to keep foot forward with abduction Standing marches R LE 2x10  Seated ankle pumps R LE on double stacked yoga blocks 1x20 Seated knee ext R LE 2x10 Seated heel slides R LE 2x20 Forward lunges on 2in step R LE 10x3'  BALANCE TRAINING The following exercises were performed in static standing with feet shoulder width apart: Eye closed and open  1 minute  EC standing Cervical  rotation 5 reps each direction EC standing Cervical flexion and extension 5 reps each direction      08/13/24  Evaluation, education throughout regarding goals Reviewed her current home program, she was able to replicate, includes seated heel slides with yoga strap,seated long arc quads with yoga strap , allowing R knee to bend and gravity assisted R knee flexion stretch. Supine glut sets, supine hip abd/ add with yoga strap,quad sets   Attempted short arc quads with pillow rolled under knee, with yoga strap assist, very painful, performed about 6 reps Also education on floating R heel with pillow due to some heel pain/ pressure overnight Adjuncted her current home program with new ex including standing heel/toe rocks, also standing 3 way hip, pushing into floor with foot on washcloth to engage proprioception and some approximation through jts        PATIENT EDUCATION:  Education details: POC, goals Person educated: Patient and Spouse Education method: Explanation, Demonstration, Tactile cues, and Verbal cues Education comprehension: verbalized understanding   HOME EXERCISE PROGRAM: Access Code: [email or web address] URL: [email or web address] Date: 08/21/2024 Prepared by: Garnette Montclair  Exercises - Standing 3-way Hip with Vannie  - 1 x daily - 7 x weekly - 2 sets - 10 reps - Standing Ankle Dorsiflexion with Table Support  - 1 x daily - 7 x weekly - 2 sets - 10 reps - Supine Short Arc Quad  - 1 x daily - 7 x weekly - 2 sets - 10 reps - Supine Hip and Knee Flexion AROM with Swiss Ball  - 1 x daily - 7 x weekly - 3 sets - 10 reps - Seated Knee Extension AAROM  - 1 x daily - 7 x weekly - 3 sets - 10 reps - Hip Flexion with Knee Extension Caregiver PROM  - 1 x daily - 7 x weekly - 3 sets - 10 reps  ASSESSMENT:   CLINICAL IMPRESSION:  09/03/24: Pt was able to complete thera ex today, however noted fatigue after some of the standing ex. PT measured edema circumference today R 22  in and L 16.5 and deferred ROM due to excessive swelling and stiffness of pt R LE. Thera ex and vaso was utilized today focusing on increasing blood flow, light strengthening, and reduce edema in R LE. Pt enjoyed vaso and showed increased ROM and improved gait post tx. PT advised pt to reach out to MD about future POC to transition to being off the walker and using a cane and getting compression brace for lymph edema. PT did discuss with pt and husband  about goals and expectations to [place] future progress. Of note Garnette Montclair, PT called [facility] about thoughts on progression of walker to cane and left a message requesting a call back. PT remains necessary for strength, gait, balance, and knee ROM deficits.   Continue per POC  Pt is a 74 y.o female treated for R TKA and distal femur replacement. Therapeutic exercise focusing on LE ROM today and light strengthening as we may have overworked last session. Knee flexion has improved to 60 degrees in sitting position. Pt responded well to treatment, will continue to progress as tolerated.  EVAL:  Patient is a 74 y.o. 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    OBJECTIVE IMPAIRMENTS: decreased activity tolerance, decreased balance, decreased endurance, decreased mobility, difficulty walking, decreased ROM, decreased strength, hypomobility, increased edema, impaired perceived functional ability, impaired flexibility, improper body mechanics, postural dysfunction, and pain.    ACTIVITY LIMITATIONS: carrying, lifting, bending, standing, squatting, sleeping, stairs, transfers, bed mobility, bathing, dressing, and locomotion level   PARTICIPATION LIMITATIONS: meal prep, cleaning, laundry, community activity, yard work, and church   PERSONAL FACTORS: Age, Past/current experiences, Time since  onset of injury/illness/exacerbation, and 1-2 comorbidities: small cell lung disease , previous pathological fracture R prox femur, are also affecting patient's functional outcome.    REHAB POTENTIAL: Good   CLINICAL DECISION MAKING: Evolving/moderate complexity   EVALUATION COMPLEXITY: Moderate     GOALS: Goals reviewed with patient? Yes   SHORT TERM GOALS: Target date: 4 weeks,09/11/23 I in HEP and progression Baseline:adapted today Goal status: met- 08/19/24     LONG TERM GOALS: Target date: 12 weeks, 11/05/24   Improve PSFS score from 3.67 to 7 or greater Baseline:  Goal status: INITIAL   2.  Improve R knee flexion from 42 to 90 degrees for improved function for transfers, gait, mobility Baseline:  08/23/24: see updated tables above Goal status: IN PROGRESS   3.  Improve R LE strength, hip and knee musculature to 3+/5 throughout, currently trace to 2+/5 Baseline: See chart Goal status: Progressing   4.  Gait 350' at a time , without rest,with LAD for household ambulation Baseline:  Goal status: INITIAL       5.   Patient will demonstrate </= 14 sec on TUG test to reduce fall risk   Baseline:  08/28/24 = 18 sec   Goal status:  INITIAL PLAN:   PT FREQUENCY: 2 x week x 1xweek, then 3 x week x 2 weeks, then 2 x week weeks 4 to 12   PT DURATION: 12 weeks   PLANNED INTERVENTIONS: 97110-Therapeutic exercises, 97530- Therapeutic activity, 97112- Neuromuscular re-education, 97535- Self Care, [id number]- Manual therapy, [phone]- Aquatic Therapy, [phone]- Electrical stimulation (manual), Patient/Family education, Balance training, Stair training, Taping, Cryotherapy, and Moist heat   PLAN FOR NEXT SESSION:   continue to work on knee extension strength and knee flexion ROM and knee strengthening     Felisa Zechman, Student-PT  08/16/2024, 10:30 am "

## 2024-09-04 ENCOUNTER — Encounter: Payer: Self-pay | Admitting: Hematology & Oncology

## 2024-09-04 ENCOUNTER — Other Ambulatory Visit (HOSPITAL_COMMUNITY): Payer: Self-pay

## 2024-09-05 ENCOUNTER — Ambulatory Visit

## 2024-09-05 ENCOUNTER — Other Ambulatory Visit: Payer: Self-pay

## 2024-09-05 DIAGNOSIS — M25661 Stiffness of right knee, not elsewhere classified: Secondary | ICD-10-CM

## 2024-09-05 DIAGNOSIS — M6281 Muscle weakness (generalized): Secondary | ICD-10-CM

## 2024-09-05 DIAGNOSIS — R262 Difficulty in walking, not elsewhere classified: Secondary | ICD-10-CM

## 2024-09-05 NOTE — Therapy (Addendum)
 " OUTPATIENT PHYSICAL THERAPY LOWER EXTREMITY TREATMENT PROGRESS NOTE Progress Note Reporting Period 08/13/24 to 09/05/24  See note below for Objective Data and Assessment of Progress/Goals.        Patient Name: Laura Bush MRN: [id number] DOB:Nov 01, 1950, 74 y.o., female Today's Date: 08/14/2024   END OF SESSION:    PT End of Session - 09/05/24 0001     Visit Number 10    Date for Recertification  11/05/24    Progress Note Due on Visit 10    PT Start Time 1145    PT Stop Time 1230    PT Time Calculation (min) 45 min    Activity Tolerance Patient tolerated treatment well    Behavior During Therapy [facility] for tasks assessed/performed                       Past Medical History:  Diagnosis Date   Allergy      seasonal   Anemia associated with stage 3 chronic renal failure (HCC) 04/18/2023   Arthritis Last several years    Mostly in hands, wrists, shoulders, knees   Asthma      in cold weather   Breast cancer (HCC) 09/18/2014    left breast   Breast cancer (HCC) 10/07/2014    bx left breast   Breast cancer of upper-outer quadrant of left female breast (HCC) 09/22/2014   Cluster headaches      in her 40's   GERD (gastroesophageal reflux disease)     Goals of care, counseling/discussion 01/14/2022   Heart murmur      as a child (has outgrown)   High cholesterol     History of radiation therapy      Left lung, Right Femur- 02/16/22-03/04/22- Dr. Lynwood Nasuti   History of radiation therapy      Right Pelvis-12/20/23-01/03/24-Dr. Lynwood Nasuti   History of radiation therapy      Right femur-05/02/24-05/17/24- Dr. 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   Stroke [facility]) summer 2022    described as tiny   Swallowing difficulty     Venous insufficiency               Past Surgical History:  Procedure Laterality Date   ABDOMINAL HYSTERECTOMY   1999   APPENDECTOMY   ~ 2006   BONE BIOPSY Right 01/04/2022    Procedure: RIGHT FEMORAL BONE BIOPSY;  Surgeon: Celena Sharper, MD;  Location: [facility] OR;  Service: Orthopedics;  Laterality: Right;   BONE EXCISION Right 04/11/2023    Procedure: BONE EXCISION;  Surgeon: Celena Sharper, MD;  Location: [facility] OR;  Service: Orthopedics;  Laterality: Right;   BREAST BIOPSY Left 10/2014   BREAST LUMPECTOMY Left 2016   BREAST REDUCTION SURGERY Bilateral 11/11/2014    Procedure: Bilateral Breast Reduction;  Surgeon: Alm Sick, MD;  Location: [facility]  OR;  Service: Government Social Research Officer;  Laterality: Bilateral;   BRONCHIAL BIOPSY   01/06/2022    Procedure: BRONCHIAL BIOPSIES;  Surgeon: Shelah Lamar RAMAN, MD;  Location: [facility] ENDOSCOPY;  Service: Pulmonary;;   BRONCHIAL BRUSHINGS   01/06/2022    Procedure: BRONCHIAL BRUSHINGS;  Surgeon: Shelah Lamar RAMAN, MD;  Location: [facility] ENDOSCOPY;  Service: Pulmonary;;   BRONCHIAL NEEDLE ASPIRATION BIOPSY   01/06/2022    Procedure: BRONCHIAL NEEDLE ASPIRATION BIOPSIES;  Surgeon: Shelah Lamar RAMAN, MD;  Location: [facility] ENDOSCOPY;  Service: Pulmonary;;   BRONCHIAL WASHINGS   01/06/2022    Procedure: BRONCHIAL WASHINGS;  Surgeon: Shelah Lamar RAMAN, MD;  Location: [facility] ENDOSCOPY;  Service: Pulmonary;;   CARPAL TUNNEL RELEASE Bilateral      2 surgeries on right, 1 on left   CESAREAN SECTION   1978; 1980   COLONOSCOPY       FEMUR IM NAIL Right 01/04/2022    Procedure: RIGHT FEMORAL INTRAMEDULLARY (IM) NAIL;  Surgeon: Celena Sharper, MD;  Location: [facility] OR;  Service: Orthopedics;  Laterality: Right;   FIDUCIAL MARKER PLACEMENT   01/06/2022     Procedure: FIDUCIAL MARKER PLACEMENT;  Surgeon: Shelah Lamar RAMAN, MD;  Location: [facility] ENDOSCOPY;  Service: Pulmonary;;   FOREARM FRACTURE SURGERY Right 2003 X 3   FRACTURE SURGERY   2003, 2023    3 surgeries, right arm -03; Femur-23   HARDWARE REMOVAL Right 04/11/2023    Procedure: HARDWARE REMOVAL;  Surgeon: Celena Sharper, MD;  Location: [facility] OR;  Service: Orthopedics;  Laterality: Right;   KNEE ARTHROSCOPY Bilateral      meniscus repair   PARTIAL MASTECTOMY WITH NEEDLE LOCALIZATION AND AXILLARY SENTINEL LYMPH NODE BX Left 11/11/2014    Procedure: LEFT BREAST PARTIAL MASTECTOMY WITH NEEDLE LOCALIZATION TIMES TWO AND LEFT AXILLARY SENTINEL LYMPH NODE Biopsy;  Surgeon: Elon Pacini, MD;  Location: [facility] OR;  Service: General;  Laterality: Left;   REDUCTION MAMMAPLASTY Bilateral 2016   SQUAMOUS CELL CARCINOMA EXCISION   1980's X 1    nose   TONSILLECTOMY   ~ 1957   TUBAL LIGATION   ?1984            Patient Active Problem List    Diagnosis Date Noted   Metastasis to bone (HCC) 02/05/2024   Facial pain 10/23/2023   Anemia associated with stage 3 chronic renal failure (HCC) 04/18/2023   Lower extremity edema 12/21/2022   Insulin  resistance 09/13/2022   Motion sickness 01/20/2022   Visit for suture removal 01/20/2022   HLD (hyperlipidemia) 01/20/2022   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 Denver  04/21/2020   Osteopenia 10/02/2018   Malignant 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, estrogen receptor positive (HCC) 05/29/2017   Ductal carcinoma in situ (DCIS) of right breast 05/29/2017   Vitamin D  deficiency 01/29/2016   Eczema 12/29/2014   Venous insufficiency 10/16/2014   Primary hypertension 10/01/2014   Hypercholesteremia 10/01/2014   GERD (gastroesophageal  reflux disease) 10/01/2014   Obesity 10/23/2013   Obstructive sleep apnea syndrome 07/19/2013   Nodule of finger 12/24/2009   Other bilateral bundle branch block 06/23/2003      PCP: Watt Raisin MD   REFERRING PROVIDER: Timmy Maude SAUNDERS MD   REFERRING DIAG: s/p hing TKA and distal femur replacement secondary to metastasis and OA R knee   THERAPY DIAG:  Difficulty in walking, not elsewhere classified   Acute pain of right knee   Stiffness of right knee, not elsewhere classified   Muscle weakness (generalized)   Rationale for Evaluation and Treatment: Rehabilitation   ONSET DATE: 07/26/24   SUBJECTIVE:    SUBJECTIVE STATEMENT: Pt reports 2/10 pain today and is experiencing swelling in R LE from thigh to ankle. Using walker in clinic today. She stated she enjoyed the vaso used last week on her R knee and also have been keeping her leg elevated with a ramp styled pillow.  EVAL:  Resting pain 3/10, completed home health PT yesterday, is managing daily tasks at home well . Using walker to get around at home.  Brought wheelchair to today's appt    PERTINENT HISTORY: Extensive history with R LE orthopedics , pathologic R prox femur fx 3 years ago, has lung Ca with METS to R femur , has undergone radiation R femur, had this surgery 07/26/24.   PAIN:  Are you having pain? Yes: NPRS scale: 0/10 Pain location: R lateral thigh, infrapatellar  , medial knee Pain description: deep  Aggravating factors: movement Relieving factors: icing   PRECAUTIONS: Other: WBAT R LE, fall risk   RED  FLAGS: Lung ca with METS to R femur        WEIGHT BEARING RESTRICTIONS: No   FALLS:  Has patient fallen in last 6 months? Did not ask   LIVING ENVIRONMENT: Lives with: lives with their spouse Lives in: House/apartment Stairs: No Has following equipment at home: Vannie - 2 wheeled, Environmental Consultant - 4 wheeled, Wheelchair (manual), shower chair, and Grab bars   OCCUPATION: retired   PLOF: Independent with basic ADLs and Independent with household mobility with device   PATIENT GOALS: not reliant on wheelchair   NEXT MD VISIT: 08/14/24   OBJECTIVE:  Note: Objective measures were completed at Evaluation unless otherwise noted.   DIAGNOSTIC FINDINGS: husband provided with pics on his phone of x rays, has long rod entire length of femur, hingesd   PATIENT SURVEYS:  PSFS: THE PATIENT SPECIFIC FUNCTIONAL SCALE   Place score of 0-10 (0 = unable to perform activity and 10 = able to perform activity at the same level as before injury or problem)   Activity Date: 08/13/24 09/05/24     Car transfers  4  10    2.bed mobility 5  6    3.walking greater than 350' for household distance  2  8    4.         Total Score 11/3: 3.67 24/3: 8         Total Score = Sum of activity scores/number of activities   Minimally Detectable Change: 3 points (for single activity); 2 points (for average score)   [facility] (nd). The Patient Specific Functional Scale . Retrieved from [email or web address]    COGNITION: Overall cognitive status: Within functional limits for tasks assessed                         SENSATION: WFL   EDEMA:  Mod edema B lower legs, R thigh and knee greater than L   MUSCLE LENGTH: Hamstrings: Right wnl deg; Left wnl deg Debby test: Right nt deg; Left nt deg   POSTURE: mod edema B Le, more pronounced R compared to L, in standing tends to ER R hip PALPATION: Has normal R patellar mobility, some tenderness   LOWER  EXTREMITY ROM:    PROM Right eval Left eval R AROM 08/19/24 R  08/23/24 R 08/26/24 R 09/05/24  Hip flexion          Hip extension          Hip abduction          Hip adduction          Hip internal rotation          Hip external rotation          Knee flexion 42   60 in sitting 65 in sitting 68 sitting 58  Knee extension Hyperextends 3 degrees        Ankle dorsiflexion          Ankle plantarflexion          Ankle inversion          Ankle eversion           (Blank rows = not tested)   LOWER EXTREMITY MMT:   MMT Right eval Left eval R 09/03/24  Hip flexion 3-   4+  Hip extension       Hip abduction 2+   3+  Hip adduction 2+     Hip internal rotation       Hip external rotation       Knee flexion     4+  Knee extension trace   4  Ankle dorsiflexion       Ankle plantarflexion 3+     Ankle inversion       Ankle eversion        (Blank rows = not tested)   LOWER EXTREMITY SPECIAL TESTS:  na   FUNCTIONAL TESTS:  Timed up and go (TUG): TBD   GAIT: Distance walked: 73' Assistive device utilized: FWW Level of assistance: SBA Comments: step through gait pattern, does stop walker briefly to advance L LE , utilizes good heel/ toe pattern                                                                                                                                  TREATMENT DATE: 09/05/24 Assessed Edema Circumference R LE knee 17.5in 314ft walk around clinic x 4 laps Standing on foam airex at counter: PT guarding  Static standing with B UE arm swings x 30s Tandem stance B UE arm swings x 30s each eyes open then close Tandem stance cup stacking Standing at counter multi planar slides with towel under L LE standing on R LE PT cued pt not to hyperextend knee Front to  back to x 10 Side to side to x 10 Circumduction x 10 HS curls 2x10 #10lbs B LE on BATCA Attempted knee extension machine -painful for pt Standing TKE with walker GTB R LE at popitieal with PT applying  resistance anteriorly 2 x 10  09/03/24 Seated heel slides RLE x 25 Seated RLE LAQ 2x10 HS curls YTB RLE 2x10 Assessed Edema Circumference R 22in and L16.5in  Standing wit walker:  3-way hip each direction 2x10  Toe raises 2x10  Heel raises 2x10  Game Ready vasopneumatic compression post session to R LE quads, hamstrings, and knee x 10 min, low compression, 34 to reduce post-exercise pain and swelling/edema   08/30/24 Gait 3 laps 270 around the clinic with her RW- supervision Seated RLE x 20 Nustep L3x60min UE/LE Seated R HS curls YTB 2 x 10 Seated RLE LAQ 1lb x 10 Seated RLE marching 1lb x 10  Clock LE reaches 12 to 6 o'clock R/L x 5; rest needed after 2x standing on RLE Foam airex:  Toe raises x 20 BLE  Heel raises x 20 BLE  Marching x 20 RLE only  Step up onto airex with RLE x 7 with UE support Standing R knee flexion stretch on 6 step 10x5 sec   08/28/24 THERAPEUTIC EXERCISE: To improve strength, endurance, ROM, and flexibility in.  Demonstration, verbal and tactile cues throughout for technique.  NuStep L3 x 6 min UE/LE  THERAPEUTIC ACTIVITIES: To improve functional performance.  Demonstration, verbal and tactile cues throughout for technique. TUG x 3 trials = 18 sec 6 step stance forward lunges gently on LLE x 20 SLR AAROM w/ PT assist x 2/10 RLE Supine QS x 20 RLE SL hip abd AAROM with PT assist x 3/5 RLE SKTC knee flexion stretch w/ RLE on PT shoulder with assisted/supported knee flexion x 1' x 3  (I think she is able to get more than 60 degrees knee flexion, but can't really measure or see it clearly as I assist her with holding the position)   NEUROMUSCULAR RE-EDUCATION: To improve posture, proprioception, and balance. Foam airex:  Toe raises x 15 BLE  Heel raises x 15 BLE  Marching x 15 RLE only  Static balance EC x 1'      08/26/24 Nustep L3x51min Seated hip ADD with ball 10x5 Seated RLAQ x 10 BLE Seated heel slides RLE x 10 Seated PF with green  band x 10  Fwd and backward gait along counter top with LUE support 4x- cues to increase step length Sidestep along counter with support 3x Standing on airex: Heel raise x 10 Toe raise x 10 EC balance x Head turns and head nods x 5 each  08/23/24 THERAPEUTIC EXERCISE: To improve strength, endurance, ROM, and flexibility in.  Demonstration, verbal and tactile cues throughout for technique.  NuStep L1 x 5 min UE/LE  THERAPEUTIC ACTIVITIES: To improve functional performance.  Demonstration, verbal and tactile cues throughout for technique. Seated knee flexion on slider 2x10 RLE Standing 3 way hip 3# ankle wt x 10 each direction RLE Sidestepping holding counter 2x down and back with 3lb ankle wt on RLE DF and PF with RTB 2x10  Seated adduction with ball  between knee 5s hold 2x5 Supine short arc quad with towel under popliteal 2x10 RLE  SLR stretch RLE with PT assisting with hand under calf and pt holding thigh in 80 degree hip flexion x 2/5  NEUROMUSCULAR RE-EDUCATION: To improve proprioception and balance. Static standing EC and EO  in each direction cervical flex, ext, rot with feet shoulder width apart Static standing on airex with PT guarding EC in neutral  Standing heel raise on airex eyes open x 10 BLE Standing toe raise on airex eyes open x 10 BLE  Vitals:  98% on RA  08/21/24 THERAPEUTIC EXERCISE: To improve strength and ROM.  Demonstration, verbal and tactile cues throughout for technique. NuStep L2 x 6' BUE/BLE  Standing 3 way hip 3# ankle wt x 10 each direction RLE Step stance on 6 step with gentle forward lunges keeping most of her weight on the LLE x 15 RLE Seated knee extension with PT assist from 60 to 0 x 2/10 RLE Seated knee flexion on slider x 20 RLE Seated knee flexion stretch on slider x 1' x 3 RLE Supine peanut ball knee flexion x 20 BLE Supine QS x 10 RLE Supine SLR w/ PT assisting w/ hand under knee and ankle x 2/5 RLE GS x 10 BLE  Added  sidestepping at kitchen counter at home, but patient was too fatigued to perform in clinic today Medbridge app updated, handouts provided  08/19/24 Bike Partial rev x Seated RLE LAQ 2x10 Supine heel slide with peanut ball x 8 Supine AP with bolster under LE x 20 Supine PF RTB  legs on bolster 2 x 10 BLE Supine DF RTB legs on bolster x 10 BLE Seated RLE heel slides on slider 2x10 Nustep L2x82min UE/LE  08/16/24 THERAPEUTIC ACTIVITY:  NuStep L2 6 minutes (LE/UE) Standing 3-way hip abduction, ext, flex R LE holding counter 2x10 verbal cues to keep foot forward with abduction Standing marches R LE 2x10  Seated ankle pumps R LE on double stacked yoga blocks 1x20 Seated knee ext R LE 2x10 Seated heel slides R LE 2x20 Forward lunges on 2in step R LE 10x3'  BALANCE TRAINING The following exercises were performed in static standing with feet shoulder width apart: Eye closed and open  1 minute  EC standing Cervical rotation 5 reps each direction EC standing Cervical flexion and extension 5 reps each direction      08/13/24  Evaluation, education throughout regarding goals Reviewed her current home program, she was able to replicate, includes seated heel slides with yoga strap,seated long arc quads with yoga strap , allowing R knee to bend and gravity assisted R knee flexion stretch. Supine glut sets, supine hip abd/ add with yoga strap,quad sets   Attempted short arc quads with pillow rolled under knee, with yoga strap assist, very painful, performed about 6 reps Also education on floating R heel with pillow due to some heel pain/ pressure overnight Adjuncted her current home program with new ex including standing heel/toe rocks, also standing 3 way hip, pushing into floor with foot on washcloth to engage proprioception and some approximation through jts        PATIENT EDUCATION:  Education details: POC, goals Person educated: Patient and Spouse Education method: Explanation,  Demonstration, Tactile cues, and Verbal cues Education comprehension: verbalized understanding   HOME EXERCISE PROGRAM: Access Code: [email or web address] URL: [email or web address] Date: 08/21/2024 Prepared by: Garnette Montclair  Exercises - Standing 3-way Hip with Vannie  - 1 x daily - 7 x weekly - 2 sets - 10 reps - Standing Ankle Dorsiflexion with Table Support  - 1 x daily - 7 x weekly - 2 sets - 10 reps - Supine Short Arc Quad  - 1 x daily - 7 x weekly - 2 sets - 10 reps - Supine  Hip and Knee Flexion AROM with Swiss Ball  - 1 x daily - 7 x weekly - 3 sets - 10 reps - Seated Knee Extension AAROM  - 1 x daily - 7 x weekly - 3 sets - 10 reps - Hip Flexion with Knee Extension Caregiver PROM  - 1 x daily - 7 x weekly - 3 sets - 10 reps  ASSESSMENT:   CLINICAL IMPRESSION:  09/03/24: Pt was able to tolerate thera ex today, however noted improved endurance with 332ft functional test compared to initial eval. PT did notice fatigue after the standing ex focusing on R knee stability. PT measured ROM at 58 degrees of R knee flexion, a decreased measurement since last taken. However pt is still experiencing swelling and stiffnes with edema circumference being at 17.5 cm today. Pt is still progressing very well and is motivated to improve. PT focused on balance, quad activation, and stability  with pt to start progression to functional rolator or cane use. She reported she would like to travel with the rolator in the future. Currently she is using a two wheel walker and needs more stability training to progress. PT remains necessary for strength, gait, balance, and knee ROM deficits. Continue per POC and updated goals.    Pt is a 74 y.o female treated for R TKA and distal femur replacement. Therapeutic exercise focusing on LE ROM today and light strengthening as we may have overworked last session. Knee flexion has improved to 60 degrees in sitting position. Pt responded well to treatment, will  continue to progress as tolerated.  EVAL:  Patient is a 74 y.o. 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    OBJECTIVE IMPAIRMENTS: decreased activity tolerance, decreased balance, decreased endurance, decreased mobility, difficulty walking, decreased ROM, decreased strength, hypomobility, increased edema, impaired perceived functional ability, impaired flexibility, improper body mechanics, postural dysfunction, and pain.    ACTIVITY LIMITATIONS: carrying, lifting, bending, standing, squatting, sleeping, stairs, transfers, bed mobility, bathing, dressing, and locomotion level   PARTICIPATION LIMITATIONS: meal prep, cleaning, laundry, community activity, yard work, and church   PERSONAL FACTORS: Age, Past/current experiences, Time since onset of injury/illness/exacerbation, and 1-2 comorbidities: small cell lung disease , previous pathological fracture R prox femur, are also affecting patient's functional outcome.    REHAB POTENTIAL: Good   CLINICAL DECISION MAKING: Evolving/moderate complexity   EVALUATION COMPLEXITY: Moderate     GOALS: Goals reviewed with patient? Yes   SHORT TERM GOALS: Target date: 4 weeks,09/11/23 I in HEP and progression Baseline:adapted today Goal status: met- 08/19/24     LONG TERM GOALS: Target date: 12 weeks, 11/05/24   Improve PSFS score from 3.67 to 7 or greater Baseline:  Goal status: MET 09/05/24   2.  Improve R knee flexion from 42 to 90 degrees for improved function for transfers, gait, mobility Baseline:  08/23/24: see updated tables above Goal status: IN PROGRESS   3.  Improve R LE strength, hip and knee musculature to 3+/5 throughout, currently trace to 2+/5 Baseline: See chart Goal status: Progressing 09/05/24 see chart above   4.  Gait 350' at a time , without rest,with LAD for  household ambulation Baseline:  Goal status: MET 09/05/24 updated today to a goal of 862ft for community distance      5.   Patient will demonstrate </= 14 sec on TUG  test to reduce fall risk   Baseline:  08/28/24 = 18 sec   Goal status:  INITIAL PLAN:   PT FREQUENCY: 2 x week x 1xweek, then 3 x week x 2 weeks, then 2 x week weeks 4 to 12   PT DURATION: 12 weeks   PLANNED INTERVENTIONS: 97110-Therapeutic exercises, 97530- Therapeutic activity, 97112- Neuromuscular re-education, 97535- Self Care, [id number]- Manual therapy, [phone]- Aquatic Therapy, [phone]- Electrical stimulation (manual), Patient/Family education, Balance training, Stair training, Taping, Cryotherapy, and Moist heat   PLAN FOR NEXT SESSION:   continue to work on knee extension strength and knee flexion ROM and knee strengthening     Amy L Speaks, PT  08/16/2024, 10:30 am "

## 2024-09-10 ENCOUNTER — Ambulatory Visit

## 2024-09-10 ENCOUNTER — Other Ambulatory Visit: Payer: Self-pay

## 2024-09-10 DIAGNOSIS — M6281 Muscle weakness (generalized): Secondary | ICD-10-CM

## 2024-09-10 DIAGNOSIS — M25661 Stiffness of right knee, not elsewhere classified: Secondary | ICD-10-CM

## 2024-09-10 DIAGNOSIS — R262 Difficulty in walking, not elsewhere classified: Secondary | ICD-10-CM

## 2024-09-10 DIAGNOSIS — M25561 Pain in right knee: Secondary | ICD-10-CM

## 2024-09-12 ENCOUNTER — Ambulatory Visit

## 2024-09-12 DIAGNOSIS — M25561 Pain in right knee: Secondary | ICD-10-CM

## 2024-09-12 DIAGNOSIS — R262 Difficulty in walking, not elsewhere classified: Secondary | ICD-10-CM

## 2024-09-12 DIAGNOSIS — M6281 Muscle weakness (generalized): Secondary | ICD-10-CM

## 2024-09-12 DIAGNOSIS — M25661 Stiffness of right knee, not elsewhere classified: Secondary | ICD-10-CM

## 2024-09-12 NOTE — Therapy (Signed)
 " OUTPATIENT PHYSICAL THERAPY LOWER EXTREMITY TREATMENT       Patient Name: Laura Bush MRN: [id number] DOB:[birth year], 74 y.o., female Today's Date: 08/14/2024   END OF SESSION:    PT End of Session - 09/12/24 1232     Visit Number 12    Date for Recertification  11/05/24    Progress Note Due on Visit 10    PT Start Time 1147    PT Stop Time 1230    PT Time Calculation (min) 43 min    Activity Tolerance Patient tolerated treatment well    Behavior During Therapy [facility] for tasks assessed/performed                        Past Medical History:  Diagnosis Date   Allergy      seasonal   Anemia associated with stage 3 chronic renal failure (HCC) 04/18/2023   Arthritis Last several years    Mostly in hands, wrists, shoulders, knees   Asthma      in cold weather   Breast cancer (HCC) 09/18/2014    left breast   Breast cancer (HCC) 10/07/2014    bx left breast   Breast cancer of upper-outer quadrant of left female breast (HCC) 09/22/2014   Cluster headaches      in her 40's   GERD (gastroesophageal reflux disease)     Goals of care, counseling/discussion 01/14/2022   Heart murmur      as a child (has outgrown)   High cholesterol     History of radiation therapy      Left lung, Right Femur- 02/16/22-03/04/22- Dr. Lynwood Nasuti   History of radiation therapy      Right Pelvis-12/20/23-01/03/24-Dr. Lynwood Nasuti   History of radiation therapy      Right femur-05/02/24-05/17/24- Dr. 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) summer 2022    described as tiny   Swallowing difficulty     Venous insufficiency               Past Surgical History:  Procedure Laterality Date   ABDOMINAL HYSTERECTOMY   1999   APPENDECTOMY   ~ 2006   BONE BIOPSY Right 01/04/2022    Procedure: RIGHT FEMORAL BONE BIOPSY;  Surgeon: Celena Sharper, MD;  Location: [facility] OR;  Service: Orthopedics;  Laterality: Right;   BONE EXCISION Right 04/11/2023    Procedure: BONE EXCISION;  Surgeon: Celena Sharper, MD;  Location: [facility] OR;  Service: Orthopedics;  Laterality: Right;   BREAST BIOPSY Left 10/2014   BREAST LUMPECTOMY Left 2016   BREAST REDUCTION SURGERY Bilateral 11/11/2014    Procedure: Bilateral Breast Reduction;  Surgeon: Alm Sick, MD;  Location: [facility] OR;  Service: Plastics;  Laterality: Bilateral;   BRONCHIAL BIOPSY   01/06/2022    Procedure: BRONCHIAL BIOPSIES;  Surgeon: Shelah Lamar RAMAN, MD;  Location: [facility] ENDOSCOPY;  Service: Pulmonary;;   BRONCHIAL BRUSHINGS   01/06/2022    Procedure: BRONCHIAL BRUSHINGS;  Surgeon: Shelah Lamar RAMAN, MD;  Location: [facility] ENDOSCOPY;  Service: Pulmonary;;   BRONCHIAL NEEDLE ASPIRATION BIOPSY   01/06/2022    Procedure: BRONCHIAL NEEDLE ASPIRATION BIOPSIES;  Surgeon: Shelah Lamar RAMAN, MD;  Location: [facility] ENDOSCOPY;  Service: Pulmonary;;   BRONCHIAL WASHINGS   01/06/2022    Procedure: BRONCHIAL WASHINGS;  Surgeon: Shelah Lamar RAMAN, MD;  Location: [facility] ENDOSCOPY;  Service: Pulmonary;;   CARPAL TUNNEL RELEASE Bilateral      2 surgeries on right, 1 on left   CESAREAN SECTION   1978; 1980   COLONOSCOPY       FEMUR IM NAIL Right 01/04/2022    Procedure: RIGHT FEMORAL INTRAMEDULLARY (IM) NAIL;  Surgeon: Celena Sharper, MD;  Location: [facility] OR;  Service: Orthopedics;  Laterality: Right;   FIDUCIAL MARKER PLACEMENT   01/06/2022    Procedure: FIDUCIAL MARKER PLACEMENT;  Surgeon: Shelah Lamar RAMAN, MD;  Location: [facility] ENDOSCOPY;  Service: Pulmonary;;   FOREARM FRACTURE  SURGERY Right 2003 X 3   FRACTURE SURGERY   2003, 2023    3 surgeries, right arm -03; Femur-23   HARDWARE REMOVAL Right 04/11/2023    Procedure: HARDWARE REMOVAL;  Surgeon: Celena Sharper, MD;  Location: [facility] OR;  Service: Orthopedics;  Laterality: Right;   KNEE ARTHROSCOPY Bilateral      meniscus repair   PARTIAL MASTECTOMY WITH NEEDLE LOCALIZATION AND AXILLARY SENTINEL LYMPH NODE BX Left 11/11/2014    Procedure: LEFT BREAST PARTIAL MASTECTOMY WITH NEEDLE LOCALIZATION TIMES TWO AND LEFT AXILLARY SENTINEL LYMPH NODE Biopsy;  Surgeon: Elon Pacini, MD;  Location: [facility] OR;  Service: General;  Laterality: Left;   REDUCTION MAMMAPLASTY Bilateral 2016   SQUAMOUS CELL CARCINOMA EXCISION   1980's X 1    nose   TONSILLECTOMY   ~ 1957   TUBAL LIGATION   ?1984            Patient Active Problem List    Diagnosis Date Noted   Metastasis to bone (HCC) 02/05/2024   Facial pain 10/23/2023   Anemia associated with stage 3 chronic renal failure (HCC) 04/18/2023   Lower extremity edema 12/21/2022   Insulin  resistance 09/13/2022   Motion sickness 01/20/2022   Visit for suture removal 01/20/2022   HLD (hyperlipidemia) 01/20/2022   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 Denver 04/21/2020   Osteopenia 10/02/2018   Malignant 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, estrogen receptor positive (HCC)  05/29/2017   Ductal carcinoma in situ (DCIS) of right breast 05/29/2017   Vitamin D  deficiency 01/29/2016   Eczema 12/29/2014   Venous insufficiency 10/16/2014   Primary hypertension 10/01/2014   Hypercholesteremia 10/01/2014   GERD (gastroesophageal reflux disease) 10/01/2014   Obesity 10/23/2013   Obstructive sleep apnea syndrome 07/19/2013   Nodule of finger 12/24/2009  Other bilateral bundle branch block 06/23/2003      PCP: Watt Raisin MD   REFERRING PROVIDER: Timmy Maude SAUNDERS MD   REFERRING DIAG: s/p hing TKA and distal femur replacement secondary to metastasis and OA R knee   THERAPY DIAG:  Difficulty in walking, not elsewhere classified   Acute pain of right knee   Stiffness of right knee, not elsewhere classified   Muscle weakness (generalized)   Rationale for Evaluation and Treatment: Rehabilitation   ONSET DATE: 07/26/24   SUBJECTIVE:    SUBJECTIVE STATEMENT: Pt reports increased soreness in her LE today.  EVAL:  Resting pain 3/10, completed home health PT yesterday, is managing daily tasks at home well . Using walker to get around at home.  Brought wheelchair to today's appt    PERTINENT HISTORY: Extensive history with R LE orthopedics , pathologic R prox femur fx 3 years ago, has lung Ca with METS to R femur , has undergone radiation R femur, had this surgery 07/26/24.   PAIN:  Are you having pain? Yes: NPRS scale: 0/10 Pain location: R lateral thigh, infrapatellar  , medial knee Pain description: deep  Aggravating factors: movement Relieving factors: icing   PRECAUTIONS: Other: WBAT R LE, fall risk   RED FLAGS: Lung ca with METS to R femur        WEIGHT BEARING RESTRICTIONS: No   FALLS:  Has patient fallen in last 6 months? Did not ask   LIVING ENVIRONMENT: Lives with: lives with their spouse Lives in: House/apartment Stairs: No Has following equipment at home: Vannie - 2 wheeled, Environmental Consultant - 4 wheeled, Wheelchair (manual), shower chair,  and Grab bars   OCCUPATION: retired   PLOF: Independent with basic ADLs and Independent with household mobility with device   PATIENT GOALS: not reliant on wheelchair   NEXT MD VISIT: 08/14/24   OBJECTIVE:  Note: Objective measures were completed at Evaluation unless otherwise noted.   DIAGNOSTIC FINDINGS: husband provided with pics on his phone of x rays, has long rod entire length of femur, hingesd   PATIENT SURVEYS:  PSFS: THE PATIENT SPECIFIC FUNCTIONAL SCALE   Place score of 0-10 (0 = unable to perform activity and 10 = able to perform activity at the same level as before injury or problem)   Activity Date: 08/13/24 09/05/24     Car transfers  4  10    2.bed mobility 5  6    3.walking greater than 350' for household distance  2  8    4.         Total Score 11/3: 3.67 24/3: 8         Total Score = Sum of activity scores/number of activities   Minimally Detectable Change: 3 points (for single activity); 2 points (for average score)   [employer] [facility] (nd). The Patient Specific Functional Scale . Retrieved from [email or web address]    COGNITION: Overall cognitive status: Within functional limits for tasks assessed                         SENSATION: WFL   EDEMA:  Mod edema B lower legs, R thigh and knee greater than L   MUSCLE LENGTH: Hamstrings: Right wnl deg; Left wnl deg Debby test: Right nt deg; Left nt deg   POSTURE: mod edema B Le, more pronounced R compared to L, in standing tends to ER R hip PALPATION: Has normal R patellar mobility, some tenderness  LOWER EXTREMITY ROM:    PROM Right eval Left eval R AROM 08/19/24 R  08/23/24 R 08/26/24 R 09/05/24  Hip flexion          Hip extension          Hip abduction          Hip adduction          Hip internal rotation          Hip external rotation          Knee flexion 42   60 in sitting 65 in sitting 68 sitting 58  Knee extension Hyperextends 3  degrees        Ankle dorsiflexion          Ankle plantarflexion          Ankle inversion          Ankle eversion           (Blank rows = not tested)   LOWER EXTREMITY MMT:   MMT Right eval Left eval R 09/03/24  Hip flexion 3-   4+  Hip extension       Hip abduction 2+   3+  Hip adduction 2+     Hip internal rotation       Hip external rotation       Knee flexion     4+  Knee extension trace   4  Ankle dorsiflexion       Ankle plantarflexion 3+     Ankle inversion       Ankle eversion        (Blank rows = not tested)   LOWER EXTREMITY SPECIAL TESTS:  na   FUNCTIONAL TESTS:  Timed up and go (TUG): TBD   GAIT: Distance walked: 93' Assistive device utilized: FWW Level of assistance: SBA Comments: step through gait pattern, does stop walker briefly to advance L LE , utilizes good heel/ toe pattern                                                                                                                                  TREATMENT DATE: 09/12/24 321ft walk around clinic x 4 laps Seated RLE LAQ x 15 Standing on foam airex at counter: PT guarding  Static standing with B UE arm swings x 30s Head turns and head nods x 5 each way Retro steps x 10 BLE switching feet after 10 reps  Pregait forward stepping and back x 10 with rollator Side to side stepping at counter x 10 Standing TKE with walker GTB R LE at popitieal with PT applying resistance anteriorly 2 x 10 HS curls BLE 2 x 10 10lb   09/05/24 Assessed Edema Circumference R LE knee 17.5in 383ft walk around clinic x 4 laps Standing on foam airex at counter: PT guarding  Static standing with B UE arm swings x 30s Tandem stance B UE arm swings x 30s  each eyes open then close Tandem stance cup stacking Standing at counter multi planar slides with towel under L LE standing on R LE PT cued pt not to hyperextend knee Front to back to x 10 Side to side to x 10 Circumduction x 10 HS curls 2x10 #10lbs B LE on  BATCA Attempted knee extension machine -painful for pt Standing TKE with walker GTB R LE at popitieal with PT applying resistance anteriorly 2 x 10  09/03/24 Seated heel slides RLE x 25 Seated RLE LAQ 2x10 HS curls YTB RLE 2x10 Assessed Edema Circumference R 22in and L16.5in  Standing wit walker:  3-way hip each direction 2x10  Toe raises 2x10  Heel raises 2x10  Game Ready vasopneumatic compression post session to R LE quads, hamstrings, and knee x 10 min, low compression, 34 to reduce post-exercise pain and swelling/edema   08/30/24 Gait 3 laps 270 around the clinic with her RW- supervision Seated RLE x 20 Nustep L3x81min UE/LE Seated R HS curls YTB 2 x 10 Seated RLE LAQ 1lb x 10 Seated RLE marching 1lb x 10  Clock LE reaches 12 to 6 o'clock R/L x 5; rest needed after 2x standing on RLE Foam airex:  Toe raises x 20 BLE  Heel raises x 20 BLE  Marching x 20 RLE only  Step up onto airex with RLE x 7 with UE support Standing R knee flexion stretch on 6 step 10x5 sec   08/28/24 THERAPEUTIC EXERCISE: To improve strength, endurance, ROM, and flexibility in.  Demonstration, verbal and tactile cues throughout for technique.  NuStep L3 x 6 min UE/LE  THERAPEUTIC ACTIVITIES: To improve functional performance.  Demonstration, verbal and tactile cues throughout for technique. TUG x 3 trials = 18 sec 6 step stance forward lunges gently on LLE x 20 SLR AAROM w/ PT assist x 2/10 RLE Supine QS x 20 RLE SL hip abd AAROM with PT assist x 3/5 RLE SKTC knee flexion stretch w/ RLE on PT shoulder with assisted/supported knee flexion x 1' x 3  (I think she is able to get more than 60 degrees knee flexion, but can't really measure or see it clearly as I assist her with holding the position)   NEUROMUSCULAR RE-EDUCATION: To improve posture, proprioception, and balance. Foam airex:  Toe raises x 15 BLE  Heel raises x 15 BLE  Marching x 15 RLE only  Static balance EC x  1'      08/26/24 Nustep L3x41min Seated hip ADD with ball 10x5 Seated RLAQ x 10 BLE Seated heel slides RLE x 10 Seated PF with green band x 10  Fwd and backward gait along counter top with LUE support 4x- cues to increase step length Sidestep along counter with support 3x Standing on airex: Heel raise x 10 Toe raise x 10 EC balance x Head turns and head nods x 5 each  08/23/24 THERAPEUTIC EXERCISE: To improve strength, endurance, ROM, and flexibility in.  Demonstration, verbal and tactile cues throughout for technique.  NuStep L1 x 5 min UE/LE  THERAPEUTIC ACTIVITIES: To improve functional performance.  Demonstration, verbal and tactile cues throughout for technique. Seated knee flexion on slider 2x10 RLE Standing 3 way hip 3# ankle wt x 10 each direction RLE Sidestepping holding counter 2x down and back with 3lb ankle wt on RLE DF and PF with RTB 2x10  Seated adduction with ball  between knee 5s hold 2x5 Supine short arc quad with towel under popliteal 2x10 RLE  SLR  stretch RLE with PT assisting with hand under calf and pt holding thigh in 80 degree hip flexion x 2/5  NEUROMUSCULAR RE-EDUCATION: To improve proprioception and balance. Static standing EC and EO in each direction cervical flex, ext, rot with feet shoulder width apart Static standing on airex with PT guarding EC in neutral  Standing heel raise on airex eyes open x 10 BLE Standing toe raise on airex eyes open x 10 BLE  Vitals:  98% on RA  08/21/24 THERAPEUTIC EXERCISE: To improve strength and ROM.  Demonstration, verbal and tactile cues throughout for technique. NuStep L2 x 6' BUE/BLE  Standing 3 way hip 3# ankle wt x 10 each direction RLE Step stance on 6 step with gentle forward lunges keeping most of her weight on the LLE x 15 RLE Seated knee extension with PT assist from 60 to 0 x 2/10 RLE Seated knee flexion on slider x 20 RLE Seated knee flexion stretch on slider x 1' x 3 RLE Supine peanut ball  knee flexion x 20 BLE Supine QS x 10 RLE Supine SLR w/ PT assisting w/ hand under knee and ankle x 2/5 RLE GS x 10 BLE  Added sidestepping at kitchen counter at home, but patient was too fatigued to perform in clinic today Medbridge app updated, handouts provided  08/19/24 Bike Partial rev x Seated RLE LAQ 2x10 Supine heel slide with peanut ball x 8 Supine AP with bolster under LE x 20 Supine PF RTB  legs on bolster 2 x 10 BLE Supine DF RTB legs on bolster x 10 BLE Seated RLE heel slides on slider 2x10 Nustep L2x32min UE/LE  08/16/24 THERAPEUTIC ACTIVITY:  NuStep L2 6 minutes (LE/UE) Standing 3-way hip abduction, ext, flex R LE holding counter 2x10 verbal cues to keep foot forward with abduction Standing marches R LE 2x10  Seated ankle pumps R LE on double stacked yoga blocks 1x20 Seated knee ext R LE 2x10 Seated heel slides R LE 2x20 Forward lunges on 2in step R LE 10x3'  BALANCE TRAINING The following exercises were performed in static standing with feet shoulder width apart: Eye closed and open  1 minute  EC standing Cervical rotation 5 reps each direction EC standing Cervical flexion and extension 5 reps each direction      08/13/24  Evaluation, education throughout regarding goals Reviewed her current home program, she was able to replicate, includes seated heel slides with yoga strap,seated long arc quads with yoga strap , allowing R knee to bend and gravity assisted R knee flexion stretch. Supine glut sets, supine hip abd/ add with yoga strap,quad sets   Attempted short arc quads with pillow rolled under knee, with yoga strap assist, very painful, performed about 6 reps Also education on floating R heel with pillow due to some heel pain/ pressure overnight Adjuncted her current home program with new ex including standing heel/toe rocks, also standing 3 way hip, pushing into floor with foot on washcloth to engage proprioception and some approximation through jts         PATIENT EDUCATION:  Education details: POC, goals Person educated: Patient and Spouse Education method: Explanation, Demonstration, Tactile cues, and Verbal cues Education comprehension: verbalized understanding   HOME EXERCISE PROGRAM: Access Code: [email or web address] URL: [email or web address] Date: 08/21/2024 Prepared by: Garnette Montclair  Exercises - Standing 3-way Hip with Vannie  - 1 x daily - 7 x weekly - 2 sets - 10 reps - Standing Ankle Dorsiflexion with Table Support  - 1 x  daily - 7 x weekly - 2 sets - 10 reps - Supine Short Arc Quad  - 1 x daily - 7 x weekly - 2 sets - 10 reps - Supine Hip and Knee Flexion AROM with Swiss Ball  - 1 x daily - 7 x weekly - 3 sets - 10 reps - Seated Knee Extension AAROM  - 1 x daily - 7 x weekly - 3 sets - 10 reps - Hip Flexion with Knee Extension Caregiver PROM  - 1 x daily - 7 x weekly - 3 sets - 10 reps  ASSESSMENT:   CLINICAL IMPRESSION:  09/03/24: Pt is still showing quad fatigue after ambulating for short distances.  PT focused on balance, quad activation, and stability  with pt to start progression to functional rolator or cane use. Currently she is using a two wheel walker and needs more stability training to progress. She still shows a great deal of edema in RLE, more like lymphedema rather than post op swelling. PT remains necessary for strength, gait, balance, and knee ROM deficits. Continue per POC and updated goals.    Pt is a 74 y.o female treated for R TKA and distal femur replacement. Therapeutic exercise focusing on LE ROM today and light strengthening as we may have overworked last session. Knee flexion has improved to 60 degrees in sitting position. Pt responded well to treatment, will continue to progress as tolerated.  EVAL:  Patient is a 74 y.o. 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    OBJECTIVE IMPAIRMENTS: decreased activity tolerance, decreased balance, decreased endurance, decreased mobility, difficulty walking, decreased ROM, decreased strength, hypomobility, increased edema, impaired perceived functional ability, impaired flexibility, improper body mechanics, postural dysfunction, and pain.    ACTIVITY LIMITATIONS: carrying, lifting, bending, standing, squatting, sleeping, stairs, transfers, bed mobility, bathing, dressing, and locomotion level   PARTICIPATION LIMITATIONS: meal prep, cleaning, laundry, community activity, yard work, and church   PERSONAL FACTORS: Age, Past/current experiences, Time since onset of injury/illness/exacerbation, and 1-2 comorbidities: small cell lung disease , previous pathological fracture R prox femur, are also affecting patient's functional outcome.    REHAB POTENTIAL: Good   CLINICAL DECISION MAKING: Evolving/moderate complexity   EVALUATION COMPLEXITY: Moderate     GOALS: Goals reviewed with patient? Yes   SHORT TERM GOALS: Target date: 4 weeks,09/11/23 I in HEP and progression Baseline:adapted today Goal status: met- 08/19/24     LONG TERM GOALS: Target date: 12 weeks, 11/05/24   Improve PSFS score from 3.67 to 7 or greater Baseline:  Goal status: MET 09/05/24   2.  Improve R knee flexion from 42 to 90 degrees for improved function for transfers, gait, mobility Baseline:  08/23/24: see updated tables above Goal status: IN PROGRESS   3.  Improve R LE strength, hip and knee musculature to 3+/5 throughout, currently trace to 2+/5 Baseline: See chart Goal status: Progressing 09/05/24 see chart above   4.  Gait 350' at a time , without rest,with LAD for household ambulation Baseline:  Goal status: MET 09/05/24 updated today to a goal of 845ft for community distance      5.   Patient will demonstrate </= 14 sec on TUG test to reduce fall risk   Baseline:  08/28/24 = 18 sec   Goal  status:  INITIAL PLAN:   PT FREQUENCY: 2 x  week x 1xweek, then 3 x week x 2 weeks, then 2 x week weeks 4 to 12   PT DURATION: 12 weeks   PLANNED INTERVENTIONS: 97110-Therapeutic exercises, 97530- Therapeutic activity, 97112- Neuromuscular re-education, 97535- Self Care, [id number]- Manual therapy, [phone]- Aquatic Therapy, [phone]- Electrical stimulation (manual), Patient/Family education, Balance training, Stair training, Taping, Cryotherapy, and Moist heat   PLAN FOR NEXT SESSION:   continue to work on knee extension strength and knee flexion ROM and knee strengthening     Adahlia Stembridge L Kordae Buonocore, PTA  08/16/2024, 10:30 am   "

## 2024-09-13 ENCOUNTER — Ambulatory Visit (HOSPITAL_COMMUNITY): Admission: RE | Admit: 2024-09-13 | Source: Ambulatory Visit

## 2024-09-13 ENCOUNTER — Ambulatory Visit: Payer: Self-pay | Admitting: Hematology & Oncology

## 2024-09-13 ENCOUNTER — Other Ambulatory Visit: Payer: Self-pay | Admitting: Hematology & Oncology

## 2024-09-13 DIAGNOSIS — C7951 Secondary malignant neoplasm of bone: Secondary | ICD-10-CM

## 2024-09-13 DIAGNOSIS — Z1231 Encounter for screening mammogram for malignant neoplasm of breast: Secondary | ICD-10-CM

## 2024-09-13 LAB — GLUCOSE, CAPILLARY: Glucose-Capillary: 70 mg/dL (ref 70–99)

## 2024-09-13 MED ORDER — GADOBUTROL 1 MMOL/ML IV SOLN
8.0000 mL | Freq: Once | INTRAVENOUS | Status: AC | PRN
  Administered 2024-09-13: 8 mL via INTRAVENOUS

## 2024-09-13 MED ORDER — FLUDEOXYGLUCOSE F - 18 (FDG) INJECTION
8.0000 | Freq: Once | INTRAVENOUS | Status: AC
  Administered 2024-09-13: 8.79 via INTRAVENOUS

## 2024-09-17 ENCOUNTER — Ambulatory Visit

## 2024-09-19 ENCOUNTER — Ambulatory Visit

## 2024-09-19 ENCOUNTER — Ambulatory Visit: Payer: Medicare HMO | Admitting: Family Medicine

## 2024-09-19 ENCOUNTER — Ambulatory Visit: Admitting: Neurology

## 2024-09-24 ENCOUNTER — Ambulatory Visit

## 2024-09-26 ENCOUNTER — Inpatient Hospital Stay: Admitting: Hematology & Oncology

## 2024-09-26 ENCOUNTER — Inpatient Hospital Stay: Attending: Hematology & Oncology

## 2024-09-26 ENCOUNTER — Inpatient Hospital Stay

## 2024-10-01 ENCOUNTER — Ambulatory Visit

## 2024-10-03 ENCOUNTER — Ambulatory Visit

## 2024-10-08 ENCOUNTER — Ambulatory Visit

## 2024-10-10 ENCOUNTER — Ambulatory Visit

## 2024-10-11 ENCOUNTER — Ambulatory Visit (HOSPITAL_COMMUNITY)

## 2024-10-11 ENCOUNTER — Ambulatory Visit

## 2024-10-15 ENCOUNTER — Ambulatory Visit

## 2024-11-14 ENCOUNTER — Ambulatory Visit
